# Patient Record
Sex: Male | Born: 1970 | Race: Black or African American | Hispanic: No | Marital: Single | Smoking: Former smoker
Health system: Southern US, Community
[De-identification: ages and names within clinical notes are randomized; demographics above are authoritative.]

## PROBLEM LIST (undated history)

## (undated) DIAGNOSIS — Z87898 Personal history of other specified conditions: Secondary | ICD-10-CM

## (undated) DIAGNOSIS — Z9289 Personal history of other medical treatment: Secondary | ICD-10-CM

## (undated) DIAGNOSIS — G4730 Sleep apnea, unspecified: Secondary | ICD-10-CM

## (undated) DIAGNOSIS — F1011 Alcohol abuse, in remission: Secondary | ICD-10-CM

## (undated) DIAGNOSIS — E785 Hyperlipidemia, unspecified: Secondary | ICD-10-CM

## (undated) DIAGNOSIS — K219 Gastro-esophageal reflux disease without esophagitis: Secondary | ICD-10-CM

## (undated) DIAGNOSIS — I471 Supraventricular tachycardia, unspecified: Secondary | ICD-10-CM

## (undated) DIAGNOSIS — I10 Essential (primary) hypertension: Secondary | ICD-10-CM

## (undated) DIAGNOSIS — I480 Paroxysmal atrial fibrillation: Secondary | ICD-10-CM

## (undated) DIAGNOSIS — I639 Cerebral infarction, unspecified: Secondary | ICD-10-CM

## (undated) DIAGNOSIS — E669 Obesity, unspecified: Secondary | ICD-10-CM

## (undated) DIAGNOSIS — F1491 Cocaine use, unspecified, in remission: Secondary | ICD-10-CM

## (undated) HISTORY — PX: APPENDECTOMY: SHX54

## (undated) HISTORY — DX: Cerebral infarction, unspecified: I63.9

## (undated) HISTORY — PX: ROTATOR CUFF REPAIR: SHX139

## (undated) HISTORY — DX: Personal history of other medical treatment: Z92.89

## (undated) HISTORY — DX: Supraventricular tachycardia: I47.1

## (undated) HISTORY — DX: Gastro-esophageal reflux disease without esophagitis: K21.9

## (undated) HISTORY — DX: Supraventricular tachycardia, unspecified: I47.10

## (undated) HISTORY — PX: TONSILLECTOMY: SUR1361

## (undated) HISTORY — DX: Hyperlipidemia, unspecified: E78.5

---

## 1998-01-23 ENCOUNTER — Emergency Department (HOSPITAL_COMMUNITY): Admission: EM | Admit: 1998-01-23 | Discharge: 1998-01-23 | Payer: Self-pay | Admitting: Emergency Medicine

## 1998-03-08 ENCOUNTER — Emergency Department (HOSPITAL_COMMUNITY): Admission: EM | Admit: 1998-03-08 | Discharge: 1998-03-08 | Payer: Self-pay | Admitting: Emergency Medicine

## 1998-07-10 ENCOUNTER — Emergency Department (HOSPITAL_COMMUNITY): Admission: EM | Admit: 1998-07-10 | Discharge: 1998-07-10 | Payer: Self-pay | Admitting: Emergency Medicine

## 1999-08-16 ENCOUNTER — Emergency Department (HOSPITAL_COMMUNITY): Admission: EM | Admit: 1999-08-16 | Discharge: 1999-08-16 | Payer: Self-pay | Admitting: Emergency Medicine

## 2000-11-23 ENCOUNTER — Encounter: Payer: Self-pay | Admitting: Internal Medicine

## 2000-11-23 ENCOUNTER — Ambulatory Visit (HOSPITAL_BASED_OUTPATIENT_CLINIC_OR_DEPARTMENT_OTHER): Admission: RE | Admit: 2000-11-23 | Discharge: 2000-11-23 | Payer: Self-pay | Admitting: Otolaryngology

## 2000-12-27 ENCOUNTER — Encounter: Payer: Self-pay | Admitting: Internal Medicine

## 2000-12-27 ENCOUNTER — Ambulatory Visit (HOSPITAL_BASED_OUTPATIENT_CLINIC_OR_DEPARTMENT_OTHER): Admission: RE | Admit: 2000-12-27 | Discharge: 2000-12-27 | Payer: Self-pay | Admitting: Otolaryngology

## 2004-09-18 ENCOUNTER — Emergency Department (HOSPITAL_COMMUNITY): Admission: EM | Admit: 2004-09-18 | Discharge: 2004-09-18 | Payer: Self-pay | Admitting: Family Medicine

## 2005-06-26 IMAGING — CR DG CHEST 1V PORT
1 series · 1 of 1 positions shown · non-contrast
Comparison: None.

CLINICAL DATA: Short of breath. 
 CHEST PORTABLE ([DATE] HOURS):

[view not recorded]
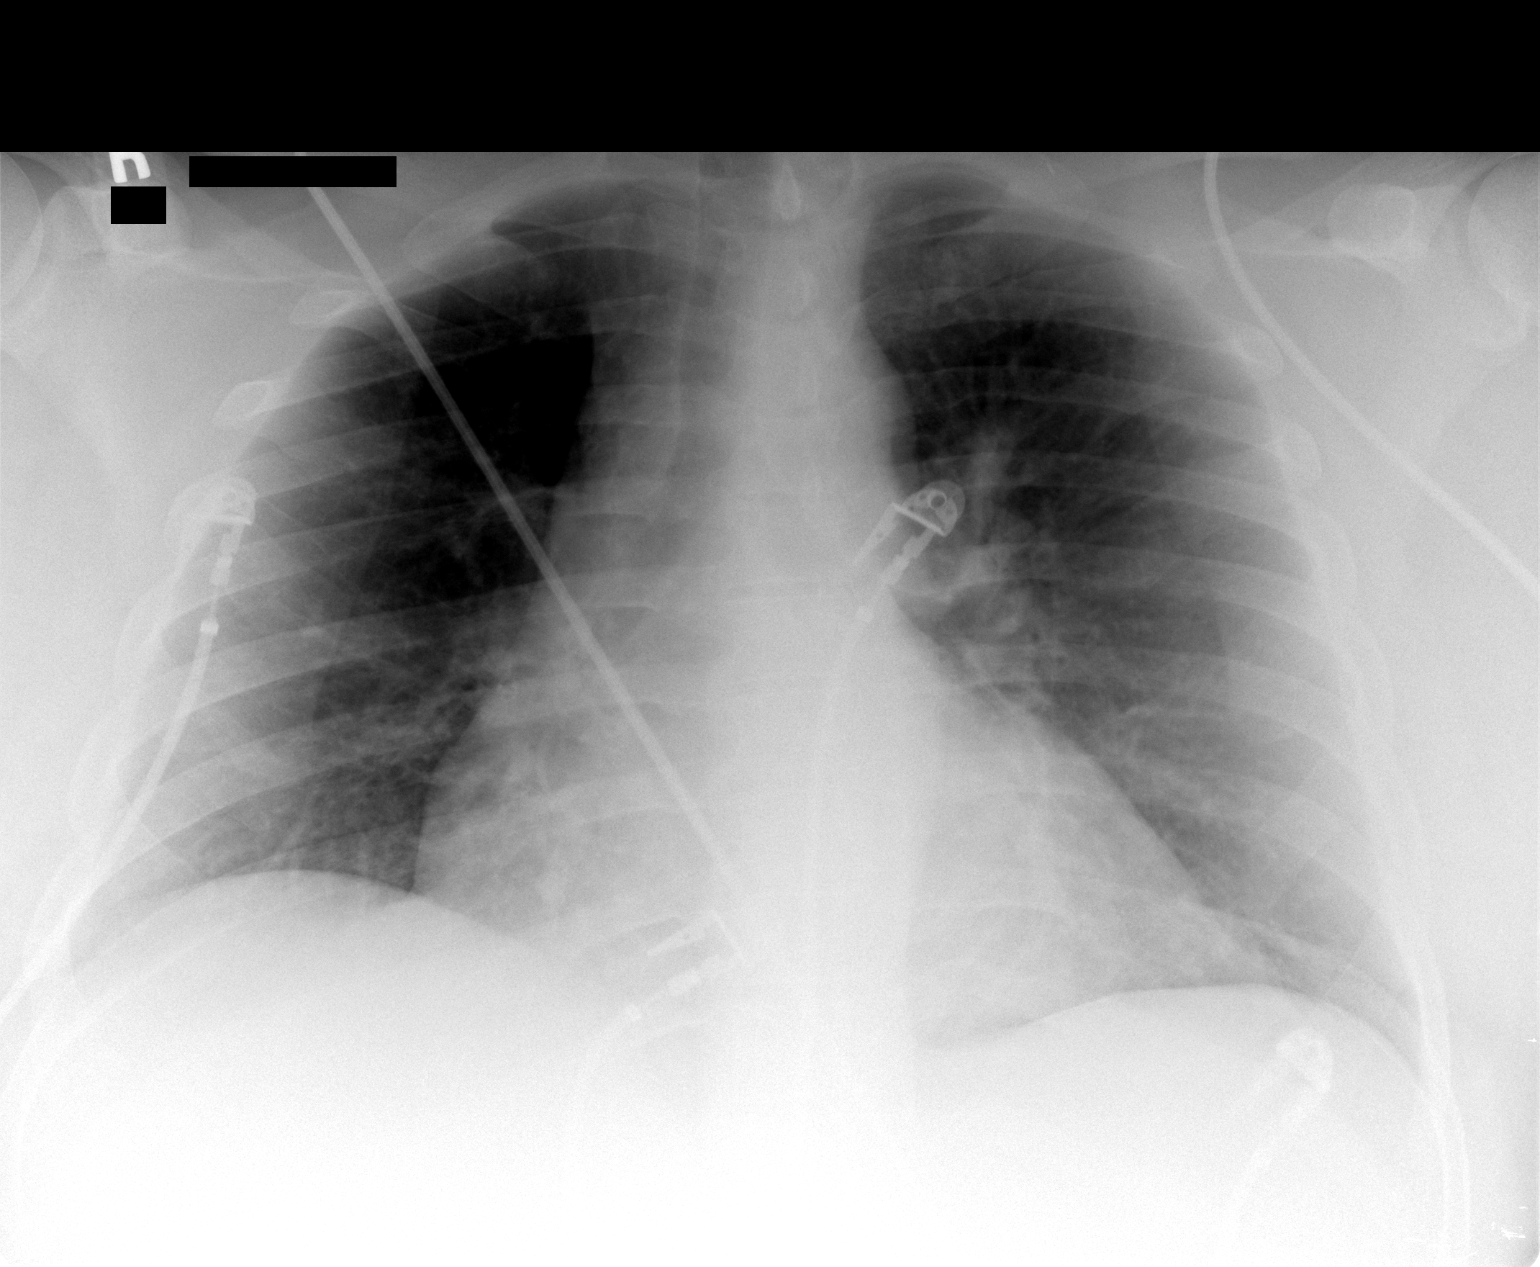

[1 of 1 positions shown; findings below may reference images not displayed]

FINDINGS: The heart is enlarged.  There is no heart failure, infiltrate, or effusion.  There is probable COPD.
IMPRESSION: No acute abnormality.

## 2005-06-27 ENCOUNTER — Inpatient Hospital Stay (HOSPITAL_COMMUNITY): Admission: EM | Admit: 2005-06-27 | Discharge: 2005-06-29 | Payer: Self-pay | Admitting: Emergency Medicine

## 2006-08-09 ENCOUNTER — Emergency Department (HOSPITAL_COMMUNITY): Admission: EM | Admit: 2006-08-09 | Discharge: 2006-08-09 | Payer: Self-pay | Admitting: *Deleted

## 2006-08-24 ENCOUNTER — Emergency Department (HOSPITAL_COMMUNITY): Admission: EM | Admit: 2006-08-24 | Discharge: 2006-08-24 | Payer: Self-pay | Admitting: Family Medicine

## 2006-08-24 IMAGING — CR DG FOOT COMPLETE 3+V*L*
3 series · 3 of 3 positions shown · non-contrast
Comparison: None.

CLINICAL DATA: 35-year-old stepped on glass.  Pain between second and third metatarsal phalangeal joints.
 LEFT FOOT - 3 VIEW:

[view not recorded (1 of 3)]
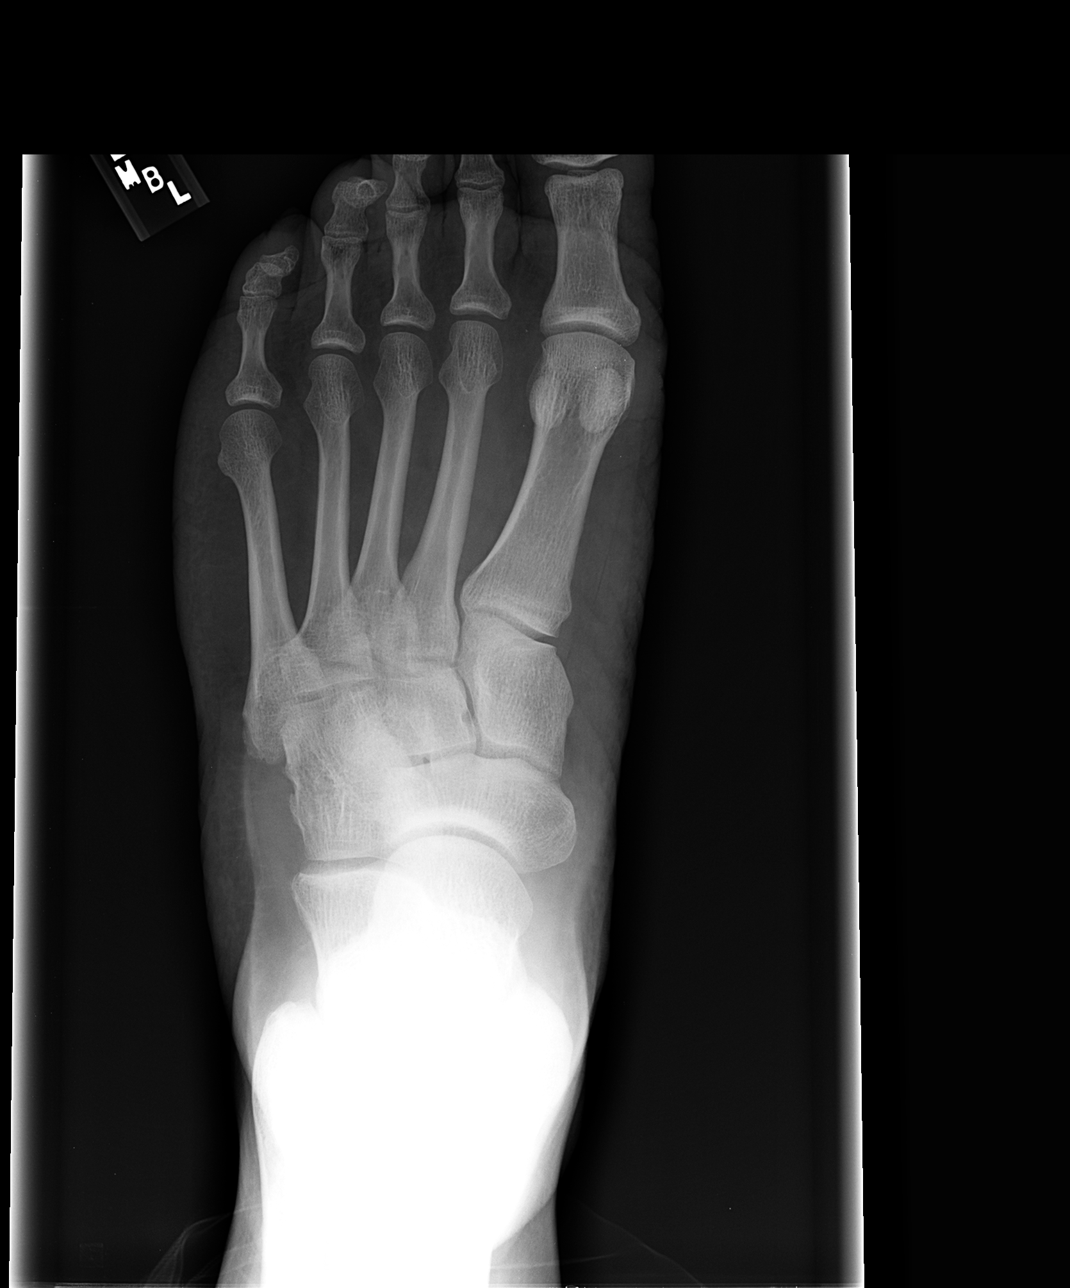

[view not recorded (2 of 3)]
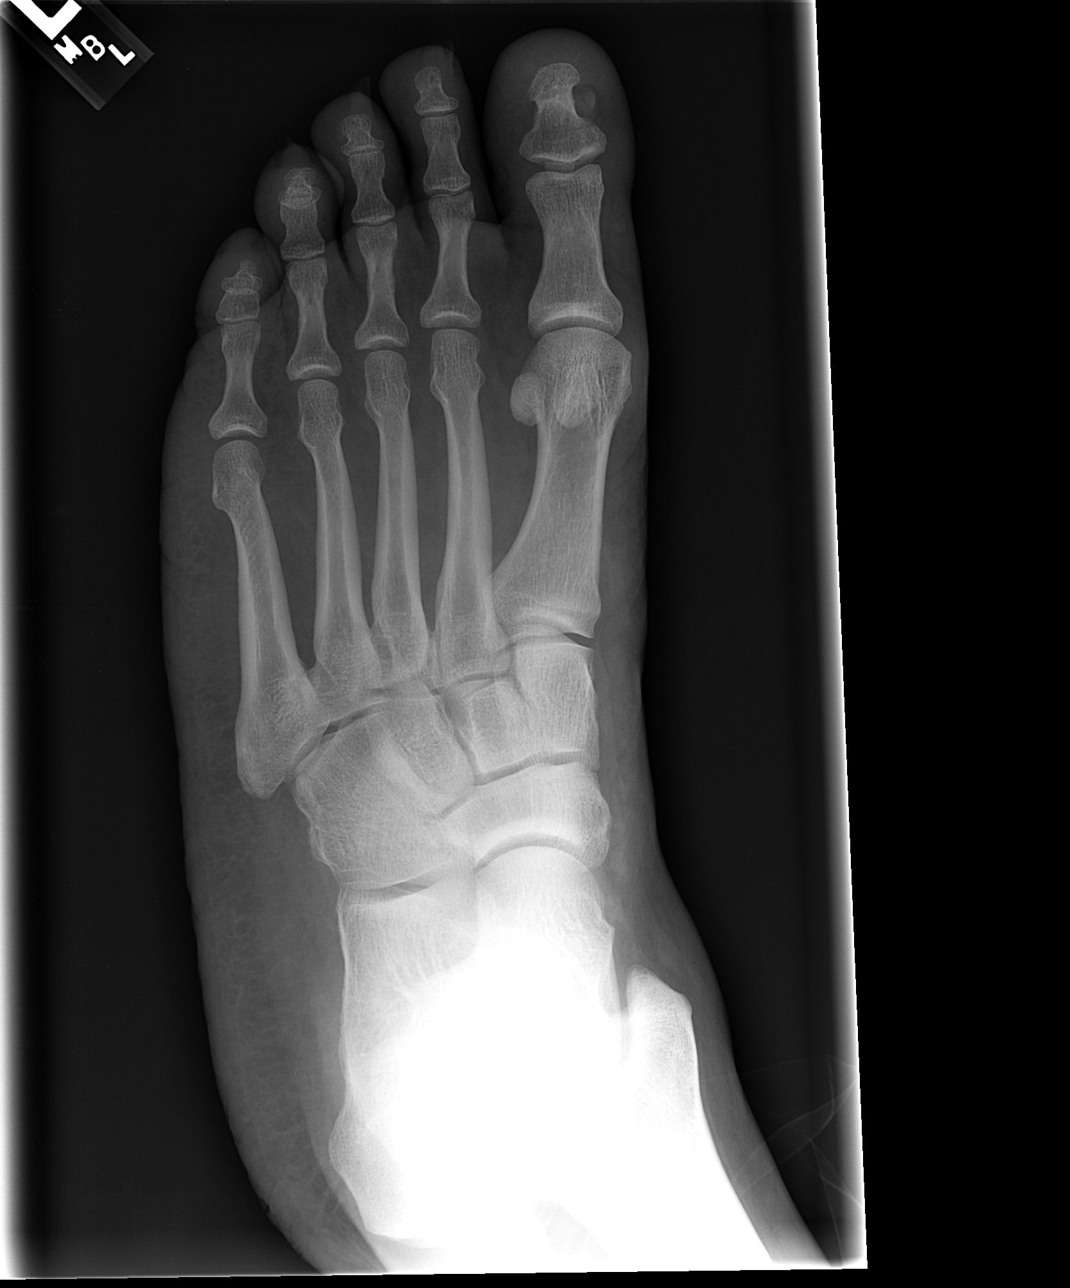

[view not recorded (3 of 3)]
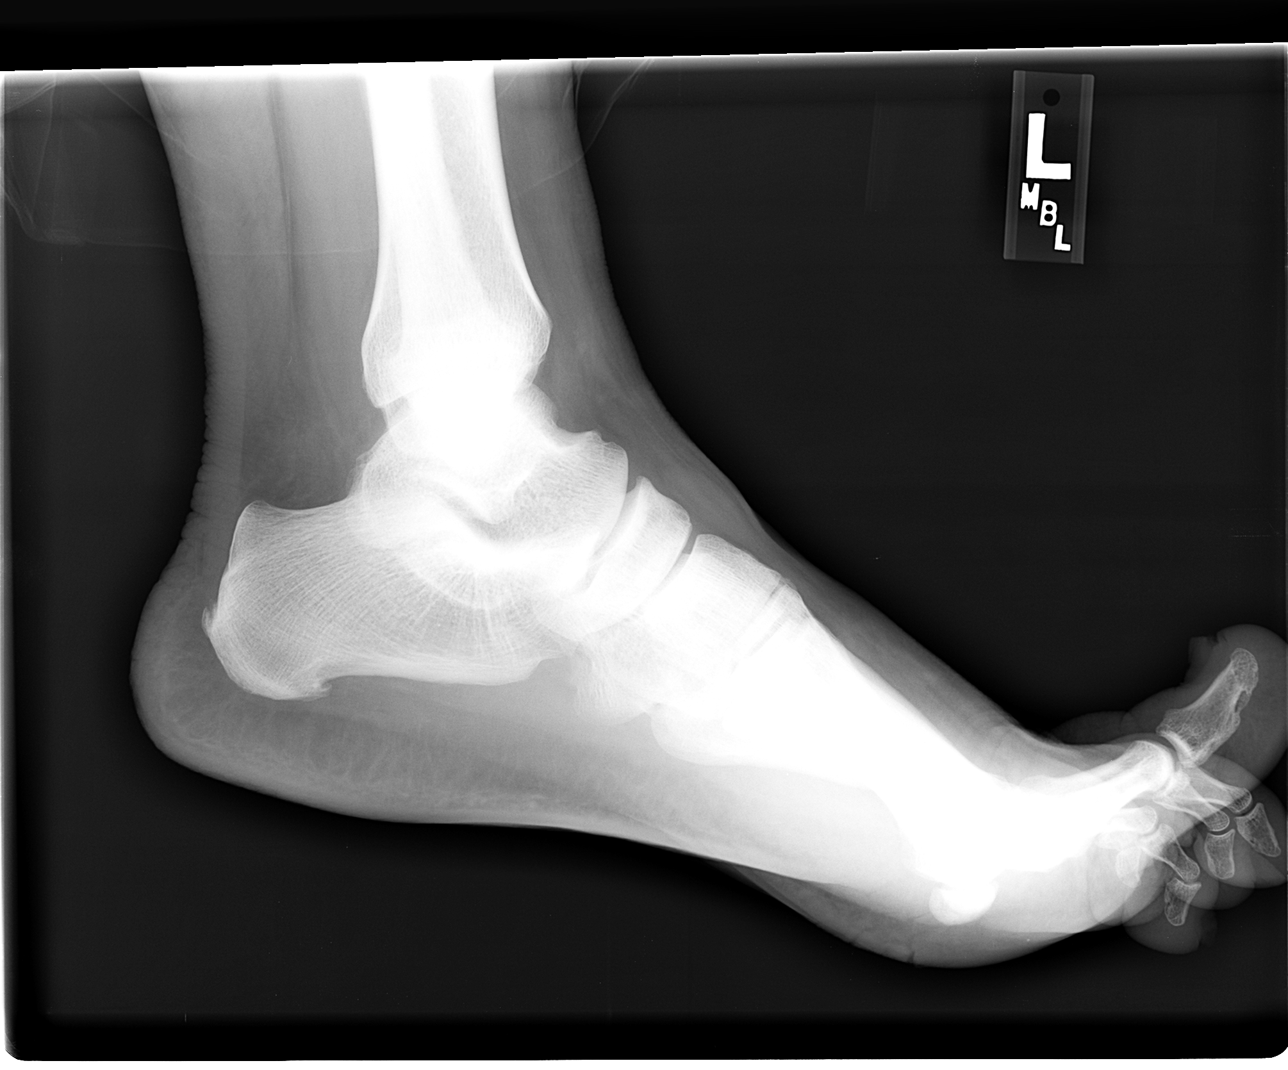

[3 of 3 positions shown; findings below may reference images not displayed]

FINDINGS: No acute bony findings.  The joint spaces are maintained.  I don?t see any findings to suggest septic arthritis.  There is no air in the soft tissues.  There are a few tiny specks on the AP film and one tiny MOUGAQ on the lateral film, which may be artifact.  I don?t see any definite worrisome radiopaque foreign body.
IMPRESSION: No acute bony findings and no definite radiopaque foreign body.

## 2007-05-06 ENCOUNTER — Inpatient Hospital Stay (HOSPITAL_COMMUNITY): Admission: EM | Admit: 2007-05-06 | Discharge: 2007-05-08 | Payer: Self-pay | Admitting: Emergency Medicine

## 2007-05-06 IMAGING — CR DG CHEST 1V PORT
1 series · 1 of 1 positions shown · non-contrast
Comparison: none

CLINICAL DATA: Shortness of breath, tachycardia

Portable chest at [0O]:
Comparison [DATE]. Low lung volumes with crowded bronchovascular structures
in the lung bases. Heart size upper limits normal. No definite effusion. No
overt edema. Visualized bones unremarkable.

[view not recorded]
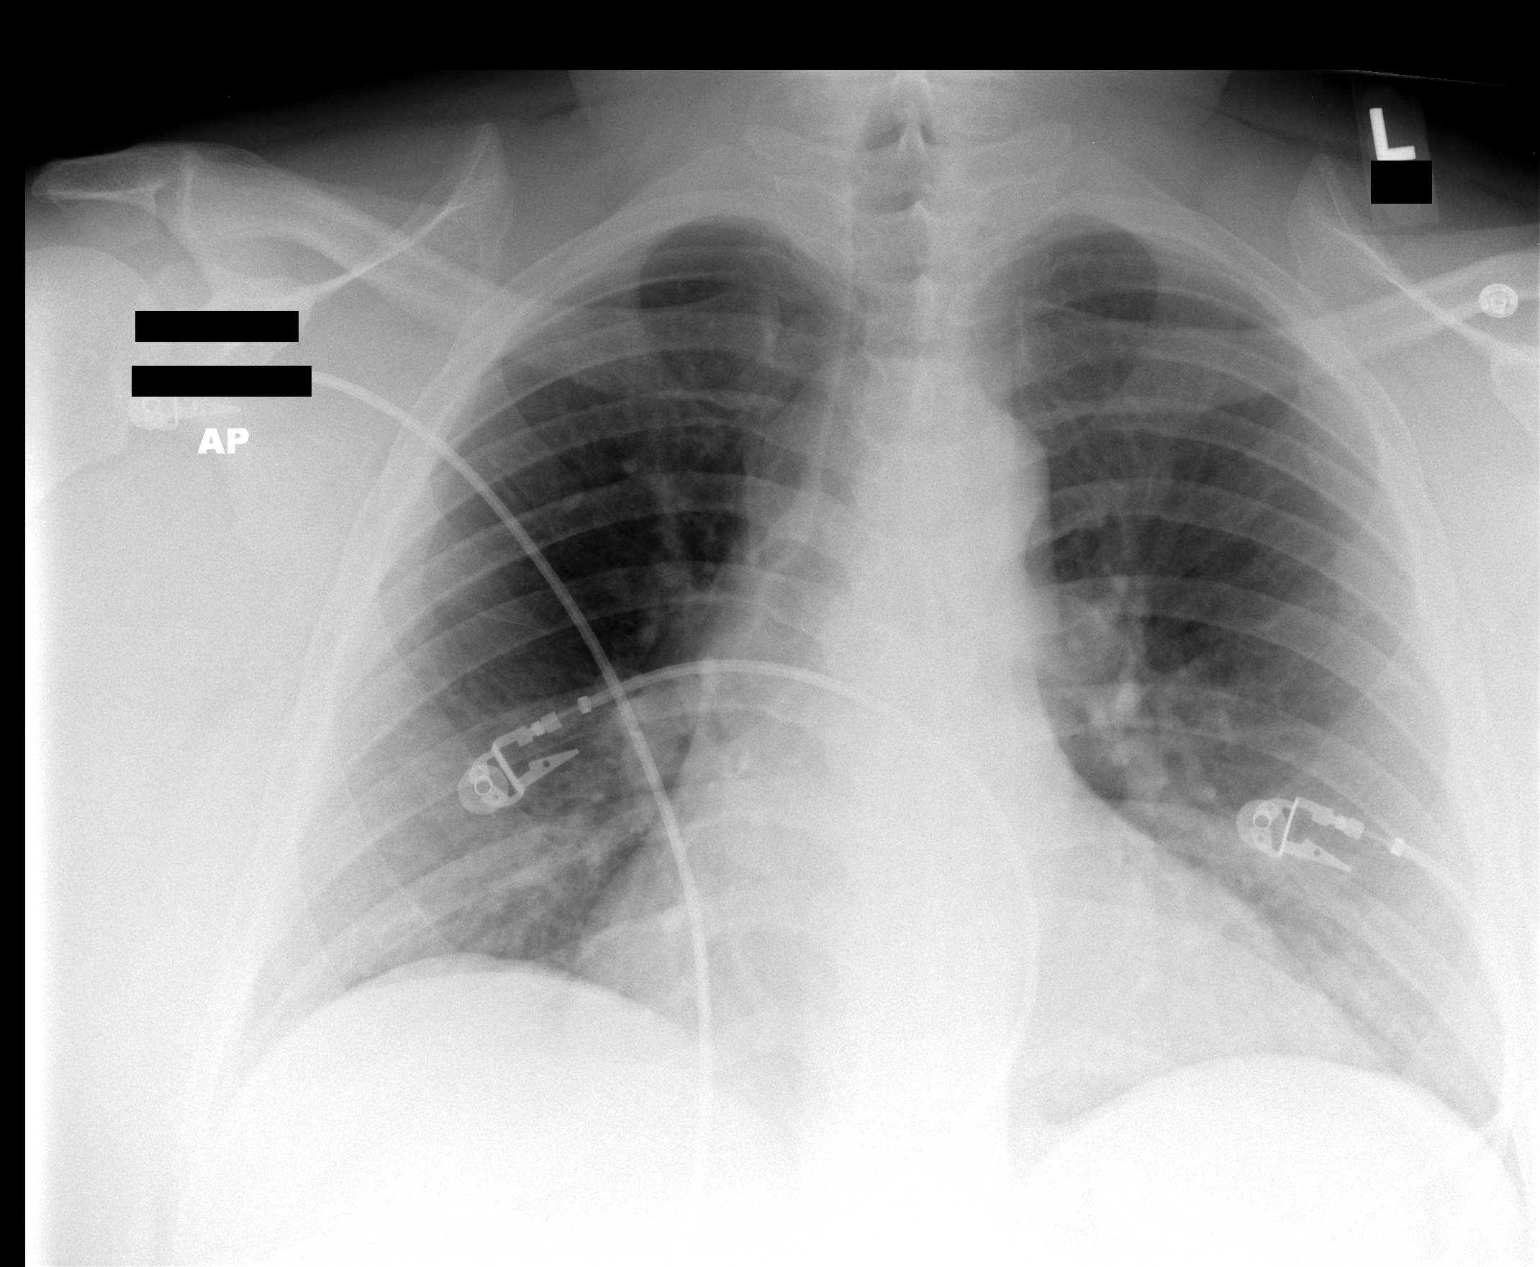

[1 of 1 positions shown; findings below may reference images not displayed]

IMPRESSION: 1. Low volumes with no convincing acute cardiopulmonary disease.

## 2007-05-07 ENCOUNTER — Encounter (INDEPENDENT_AMBULATORY_CARE_PROVIDER_SITE_OTHER): Payer: Self-pay | Admitting: *Deleted

## 2007-07-04 ENCOUNTER — Ambulatory Visit: Payer: Self-pay | Admitting: Internal Medicine

## 2007-07-17 ENCOUNTER — Emergency Department (HOSPITAL_COMMUNITY): Admission: EM | Admit: 2007-07-17 | Discharge: 2007-07-17 | Payer: Self-pay | Admitting: Family Medicine

## 2008-02-12 DIAGNOSIS — E785 Hyperlipidemia, unspecified: Secondary | ICD-10-CM | POA: Insufficient documentation

## 2008-02-12 DIAGNOSIS — F1410 Cocaine abuse, uncomplicated: Secondary | ICD-10-CM | POA: Insufficient documentation

## 2008-02-12 DIAGNOSIS — G4733 Obstructive sleep apnea (adult) (pediatric): Secondary | ICD-10-CM | POA: Insufficient documentation

## 2008-02-12 DIAGNOSIS — F1910 Other psychoactive substance abuse, uncomplicated: Secondary | ICD-10-CM | POA: Insufficient documentation

## 2008-02-13 ENCOUNTER — Ambulatory Visit: Payer: Self-pay | Admitting: Internal Medicine

## 2008-02-13 DIAGNOSIS — J302 Other seasonal allergic rhinitis: Secondary | ICD-10-CM | POA: Insufficient documentation

## 2008-02-13 DIAGNOSIS — J3089 Other allergic rhinitis: Secondary | ICD-10-CM

## 2008-03-06 ENCOUNTER — Encounter: Payer: Self-pay | Admitting: Internal Medicine

## 2008-03-10 ENCOUNTER — Observation Stay (HOSPITAL_COMMUNITY): Admission: EM | Admit: 2008-03-10 | Discharge: 2008-03-12 | Payer: Self-pay | Admitting: Emergency Medicine

## 2008-03-10 IMAGING — CR DG CHEST 1V PORT
1 series · 1 of 1 positions shown · non-contrast
Comparison: [DATE].

CLINICAL DATA: Shortness of breath.  Arrhythmia.  Diaphoretic.
History of hypertension and diabetes.

PORTABLE CHEST - 1 VIEW

[view not recorded]
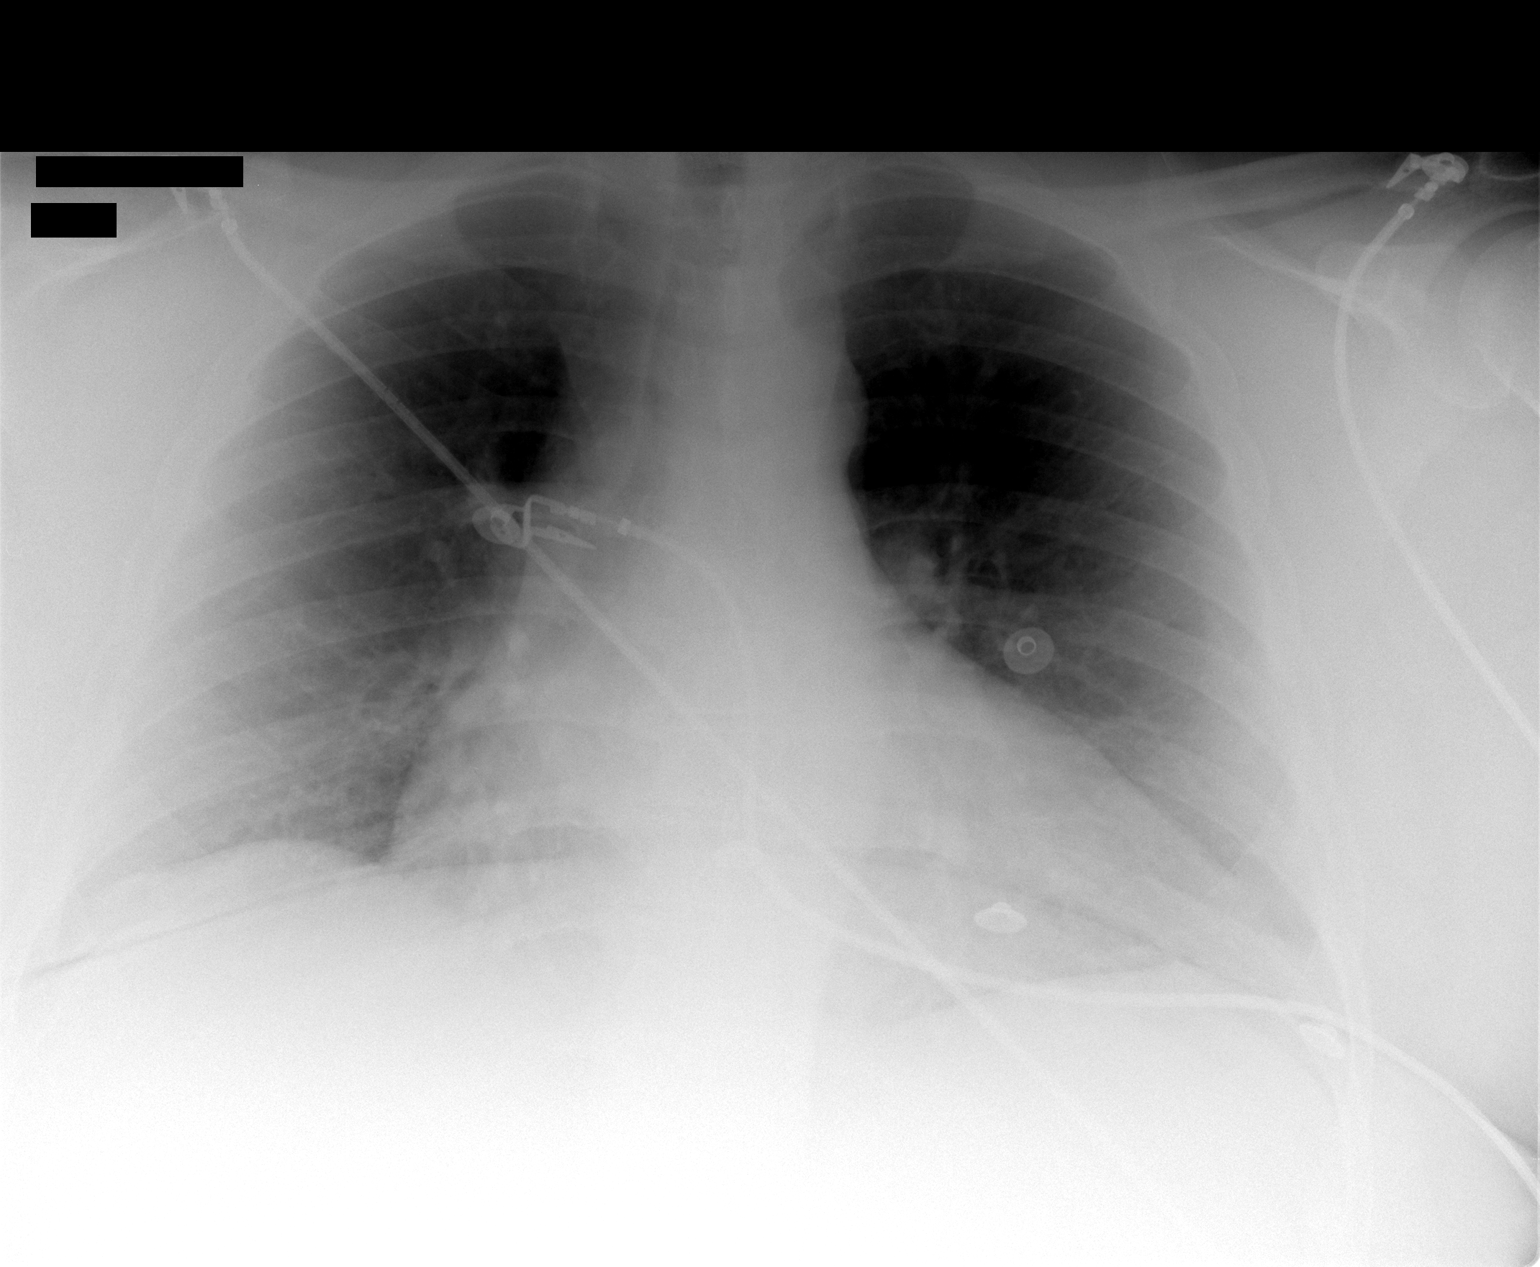

[1 of 1 positions shown; findings below may reference images not displayed]

FINDINGS: Stable enlarged cardiac silhouette.  Clear lungs with
normal vascularity.  Unremarkable bones.
IMPRESSION: Stable cardiomegaly.  No acute abnormality.

## 2008-03-13 ENCOUNTER — Ambulatory Visit: Payer: Self-pay | Admitting: Internal Medicine

## 2008-03-24 ENCOUNTER — Encounter: Admission: RE | Admit: 2008-03-24 | Discharge: 2008-03-24 | Payer: Self-pay | Admitting: Internal Medicine

## 2008-03-24 IMAGING — CR DG FINGER THUMB 2+V*L*
1 series · 1 of 1 positions shown · non-contrast
Comparison: None

CLINICAL DATA: Crush injury

LEFT THUMB 2+V

[view not recorded]
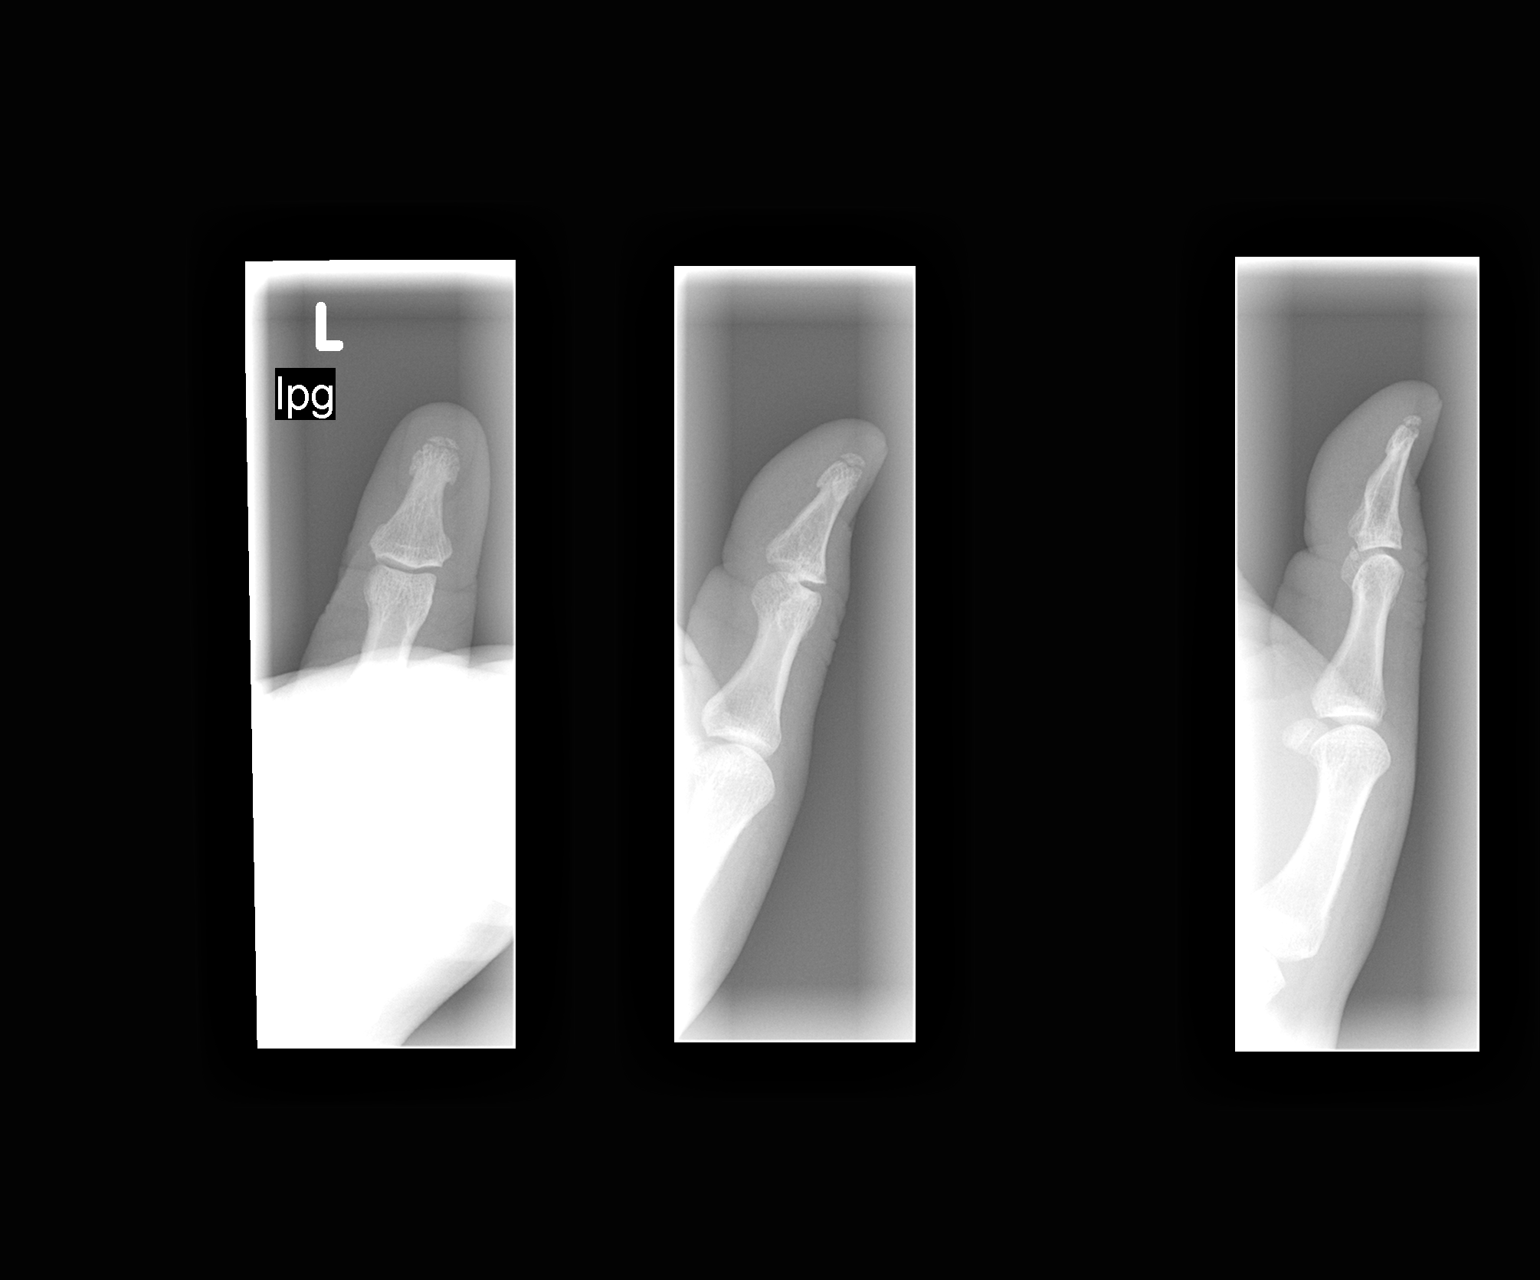

[1 of 1 positions shown; findings below may reference images not displayed]

FINDINGS: There is a transverse fracture of the distal tuft of the
first phalanx.  This shows minimal displacement.  No other fracture
or foreign bodies identified.
IMPRESSION: mildly displaced transverse fracture of the tuft of the distal
phalanx.

## 2008-11-05 ENCOUNTER — Ambulatory Visit: Payer: Self-pay | Admitting: Gastroenterology

## 2008-11-05 DIAGNOSIS — R195 Other fecal abnormalities: Secondary | ICD-10-CM | POA: Insufficient documentation

## 2008-11-09 ENCOUNTER — Ambulatory Visit: Payer: Self-pay | Admitting: Gastroenterology

## 2008-11-26 ENCOUNTER — Telehealth (INDEPENDENT_AMBULATORY_CARE_PROVIDER_SITE_OTHER): Payer: Self-pay | Admitting: *Deleted

## 2009-01-23 ENCOUNTER — Inpatient Hospital Stay (HOSPITAL_COMMUNITY): Admission: EM | Admit: 2009-01-23 | Discharge: 2009-01-26 | Payer: Self-pay | Admitting: Emergency Medicine

## 2009-01-23 ENCOUNTER — Ambulatory Visit: Payer: Self-pay | Admitting: *Deleted

## 2009-01-23 IMAGING — CR DG CHEST 1V PORT
1 series · 1 of 1 positions shown · non-contrast
Comparison: [DATE]

CLINICAL DATA: Palpitations

PORTABLE CHEST - 1 VIEW

[AP]
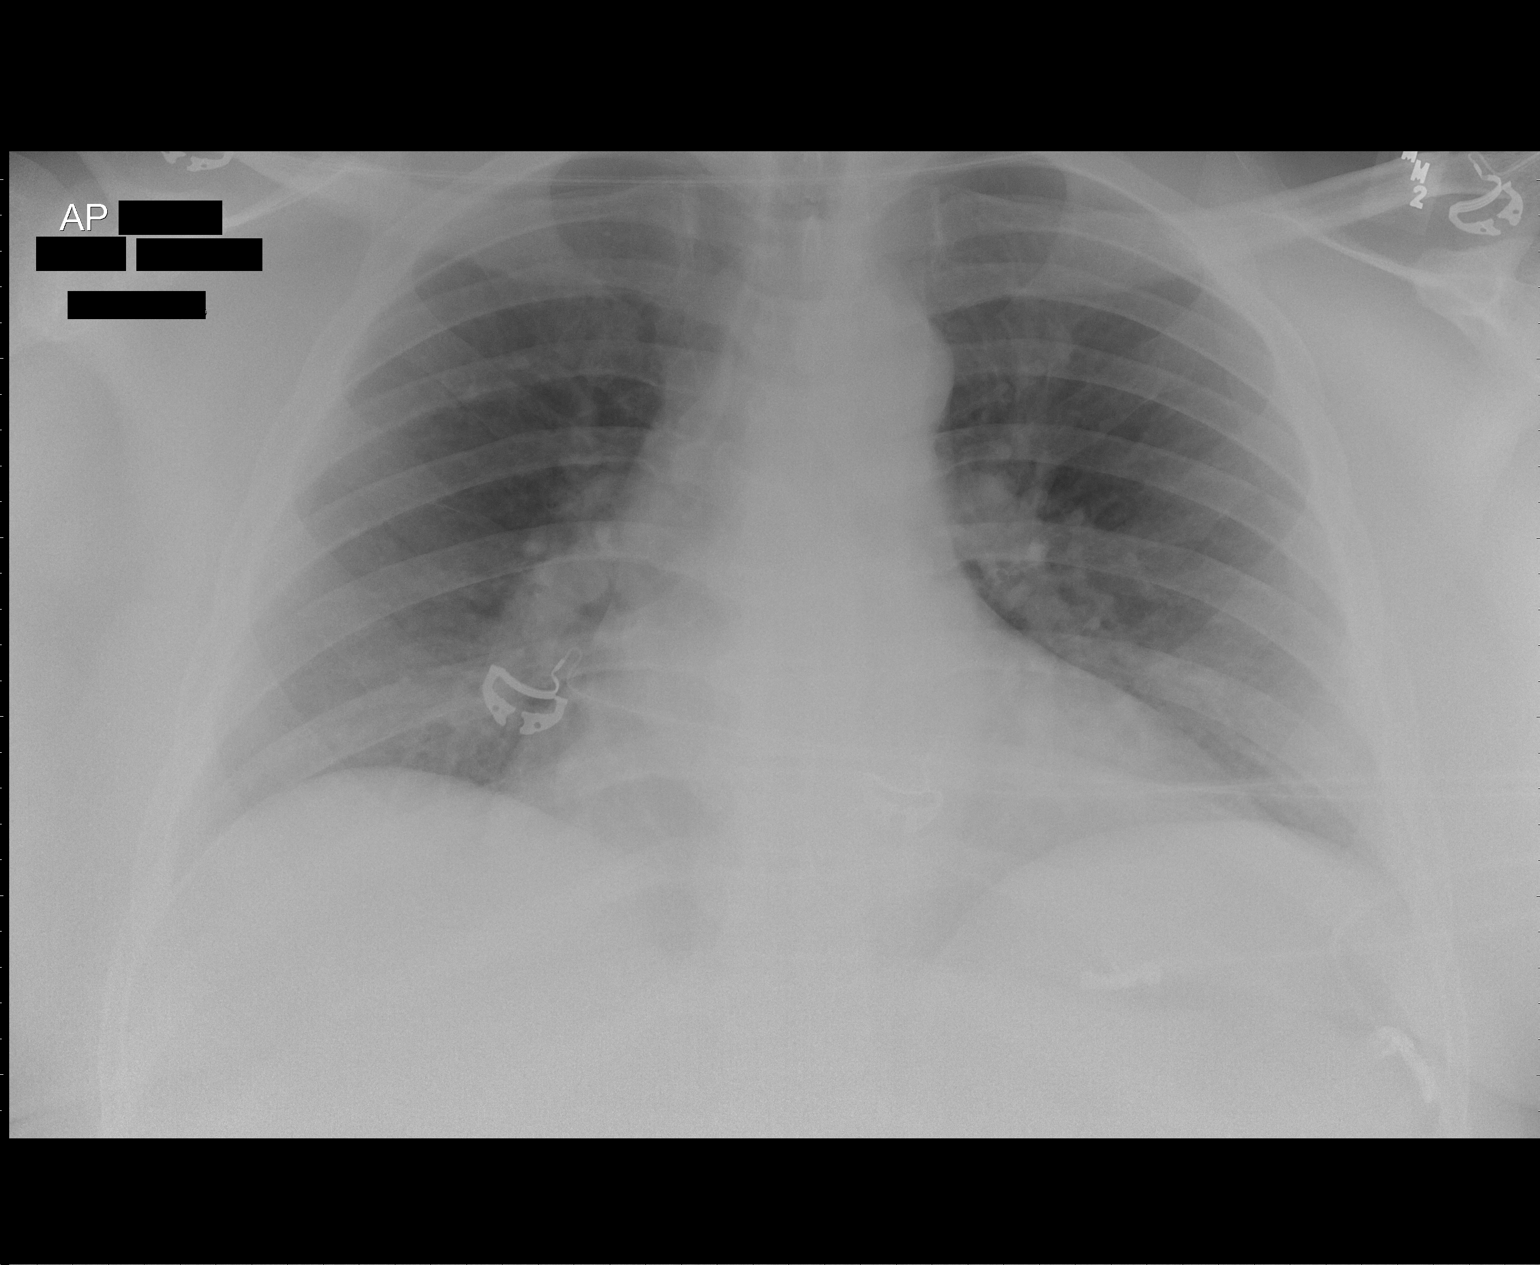

[1 of 1 positions shown; findings below may reference images not displayed]

FINDINGS: The lungs are clear.  The heart and mediastinal contours
are normal.  Osseous structures are unremarkable.
IMPRESSION: No acute cardiac or pulmonary process.

## 2009-05-10 ENCOUNTER — Emergency Department (HOSPITAL_COMMUNITY): Admission: EM | Admit: 2009-05-10 | Discharge: 2009-05-10 | Payer: Self-pay | Admitting: Internal Medicine

## 2010-01-12 ENCOUNTER — Telehealth: Payer: Self-pay | Admitting: Gastroenterology

## 2010-01-21 ENCOUNTER — Encounter (INDEPENDENT_AMBULATORY_CARE_PROVIDER_SITE_OTHER): Payer: Self-pay

## 2010-01-21 ENCOUNTER — Inpatient Hospital Stay (HOSPITAL_COMMUNITY): Admission: EM | Admit: 2010-01-21 | Discharge: 2010-01-22 | Payer: Self-pay | Admitting: Emergency Medicine

## 2010-01-21 IMAGING — CT CT ABD-PELV W/ CM
2 of 4 series · 14 of 32 positions shown, 19 images · IV contrast (water & 100 ML OMNI 300)
Comparison: None.

CLINICAL DATA: Right lower quadrant pain.  Nausea and vomiting.
Constipation.

CT ABDOMEN AND PELVIS WITH CONTRAST
TECHNIQUE: Multidetector CT imaging of the abdomen and pelvis was
performed following the standard protocol during bolus
administration of intravenous contrast.
Contrast: 100 ml [P7]

[Series 2: routine abdomen · axial · 0.98mm/px · z∈[-463,-108]mm · 6 of 101 slices shown, 11 images]
[im 15/101  soft-tissue]
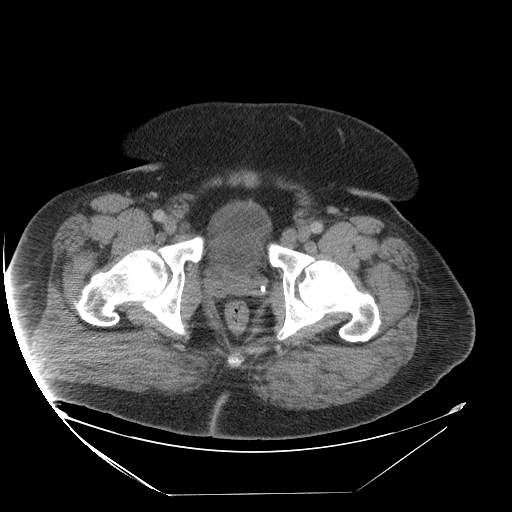
[im 15/101  bone]
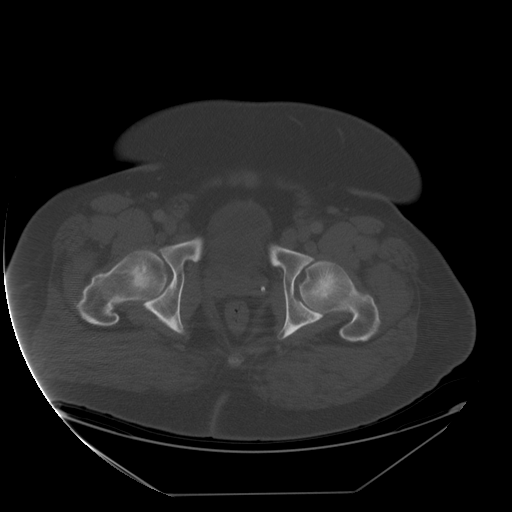
[im 29/101  soft-tissue]
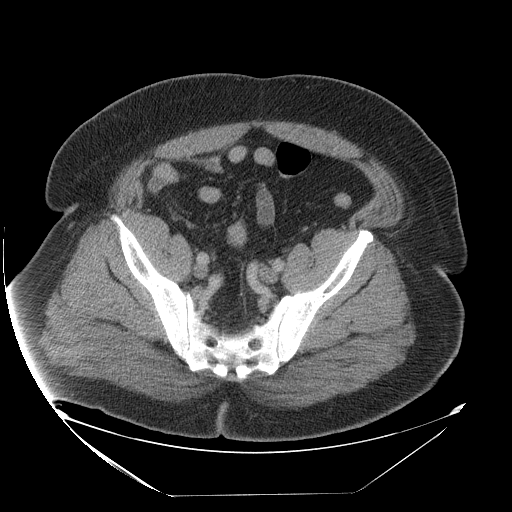
[im 43/101  soft-tissue]
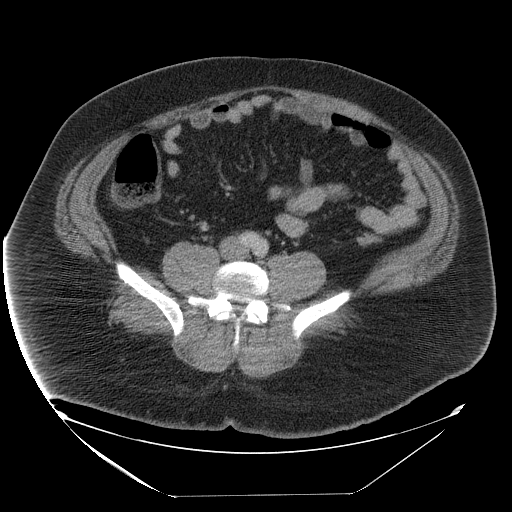
[im 43/101  lung]
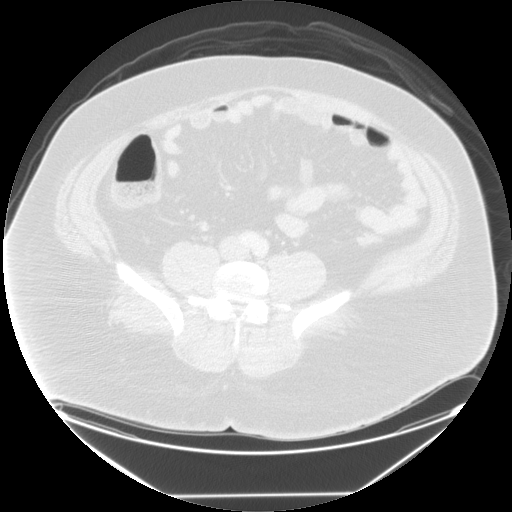
[im 58/101  soft-tissue]
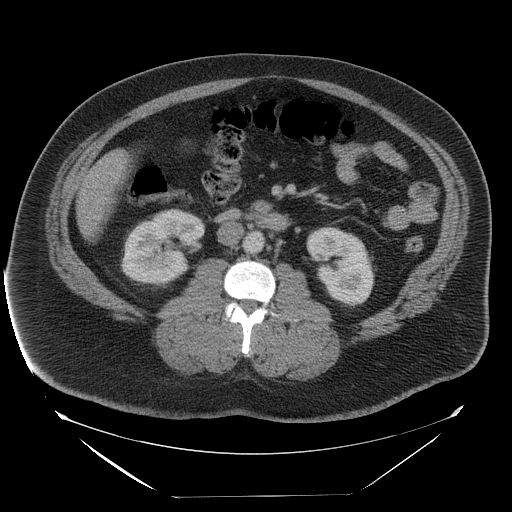
[im 58/101  lung]
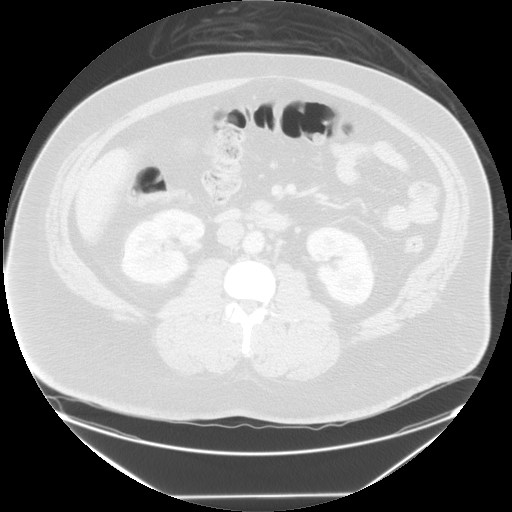
[im 72/101  soft-tissue]
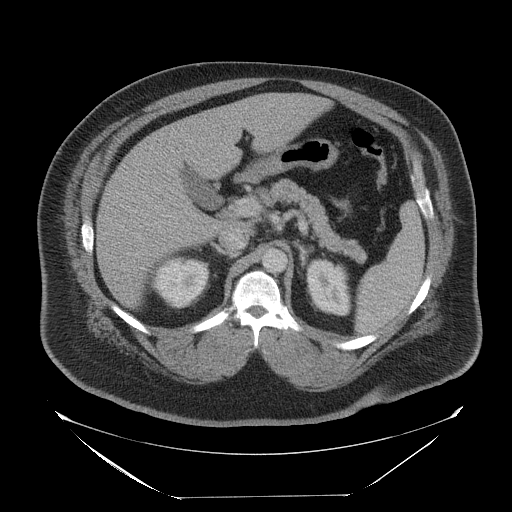
[im 72/101  lung]
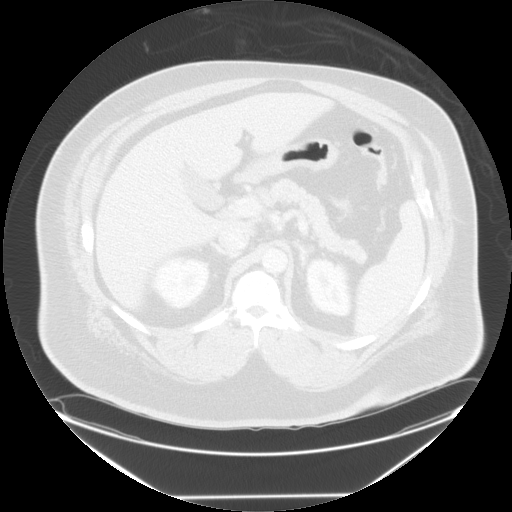
[im 86/101  soft-tissue]
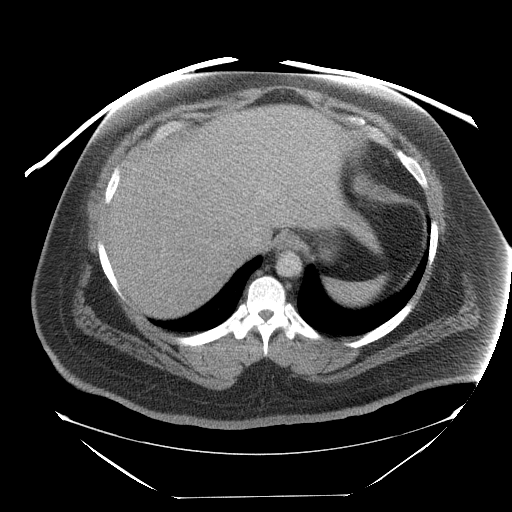
[im 86/101  lung]
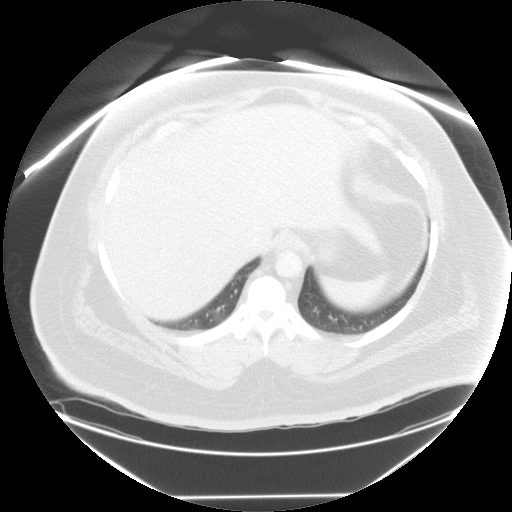

[Series 400: reformatted · sagittal · 1.00mm/px · 8 of 148 slices shown]
[im 13/148  soft-tissue]
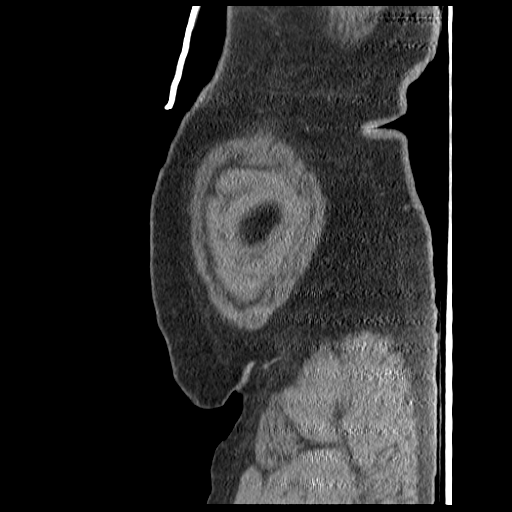
[im 37/148  soft-tissue]
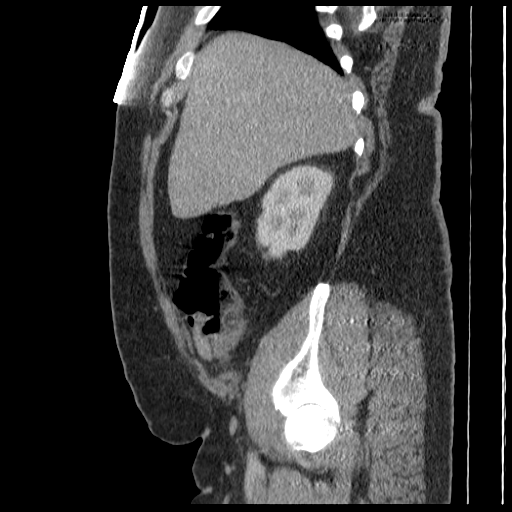
[im 50/148  soft-tissue]
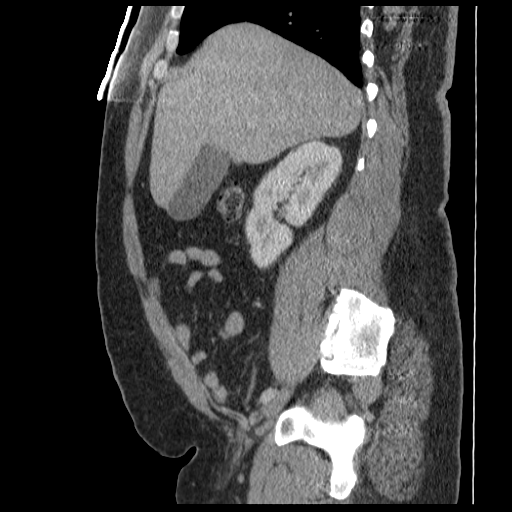
[im 62/148  soft-tissue]
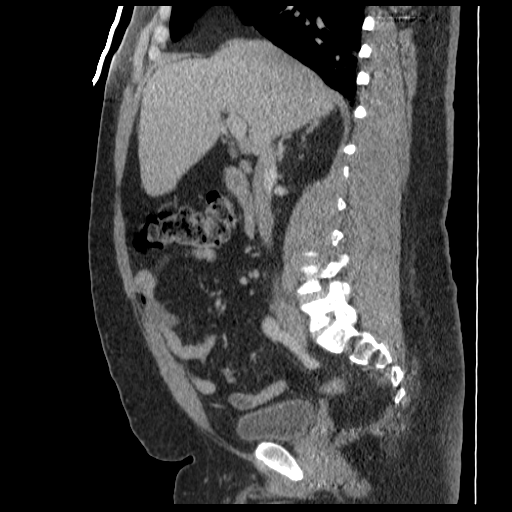
[im 86/148  soft-tissue]
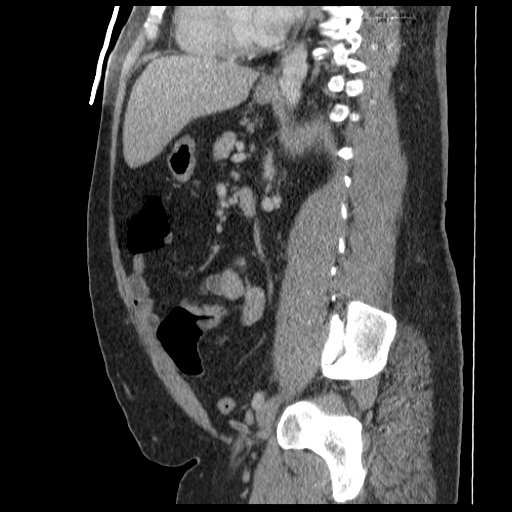
[im 99/148  soft-tissue]
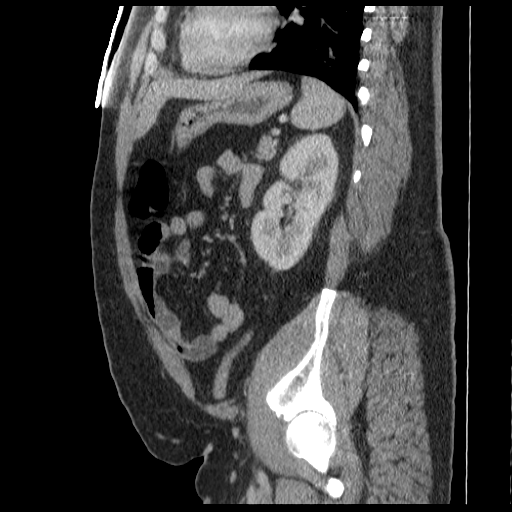
[im 111/148  soft-tissue]
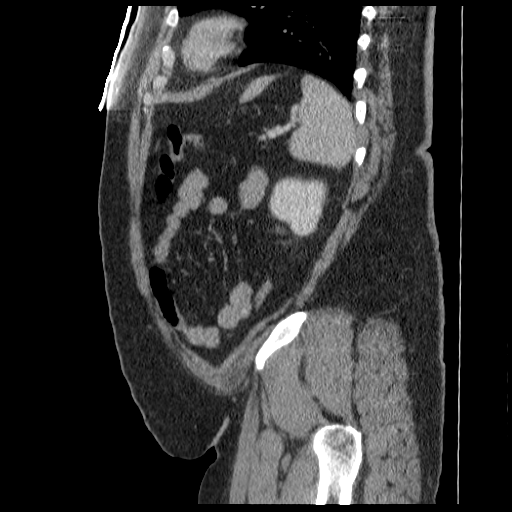
[im 135/148  soft-tissue]
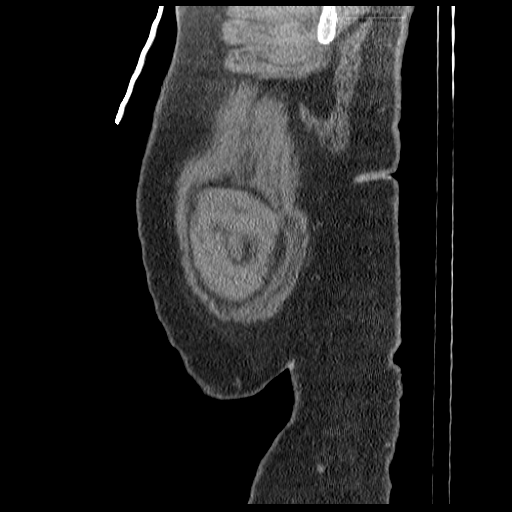

[14 of 32 positions shown; findings below may reference images not displayed]

FINDINGS: A thickened appendix is seen measuring 13-14 mm, with
mild periappendiceal inflammatory changes, consistent with acute
appendicitis.  There is no evidence of abscess or free fluid.  No
evidence of dilated bowel loops.

No soft tissue masses or adenopathy identified within the abdomen
or pelvis.  The abdominal parenchymal organs are normal in
appearance.
IMPRESSION: Acute appendicitis.  No evidence of abscess or other complication.

## 2010-03-18 ENCOUNTER — Ambulatory Visit: Payer: Self-pay | Admitting: Internal Medicine

## 2010-04-09 ENCOUNTER — Emergency Department (HOSPITAL_COMMUNITY): Admission: EM | Admit: 2010-04-09 | Discharge: 2010-04-09 | Payer: Self-pay | Admitting: Emergency Medicine

## 2010-04-11 ENCOUNTER — Emergency Department (HOSPITAL_COMMUNITY): Admission: EM | Admit: 2010-04-11 | Discharge: 2010-04-11 | Payer: Self-pay | Admitting: Emergency Medicine

## 2010-04-11 IMAGING — CT CT PELVIS W/ CM
3 series · 17 of 46 positions shown, 20 images · IV contrast (agent unspecified)
Comparison: None.

CLINICAL DATA: CT PELVIS WITH CONTRAST
TECHNIQUE: Multidetector CT imaging of the pelvis was performed
following the standard protocol.  100 ml [J8] utilized.

[Series 2: pelvis st · axial · 0.93mm/px · z∈[-806,-512]mm · 13 of 67 slices shown, 16 images]
[im 5/67  soft-tissue]
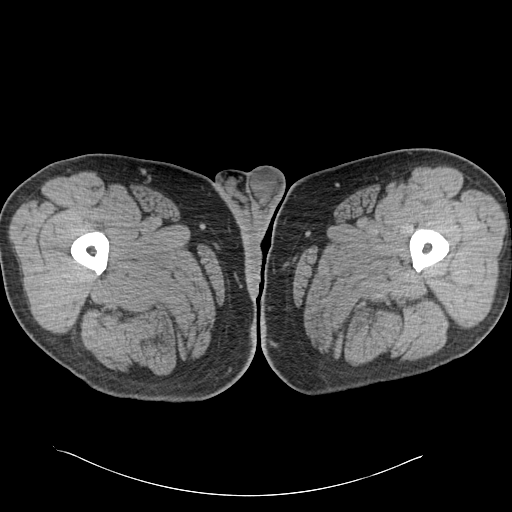
[im 5/67  bone]
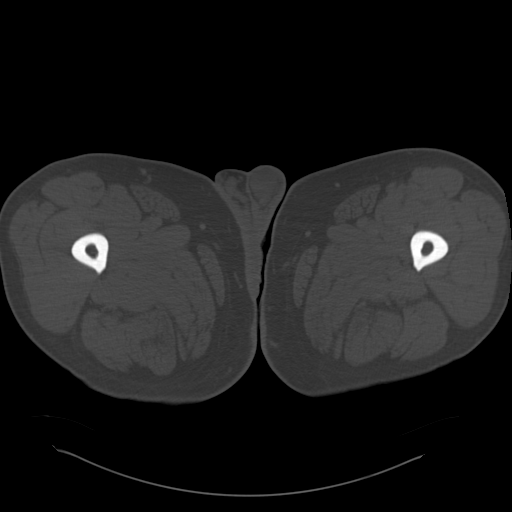
[im 11/67  soft-tissue]
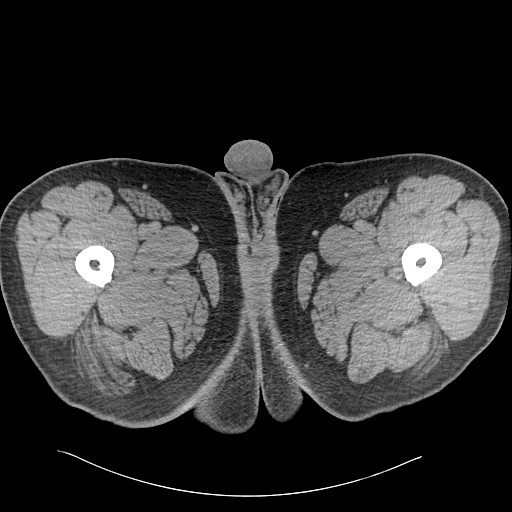
[im 18/67  soft-tissue]
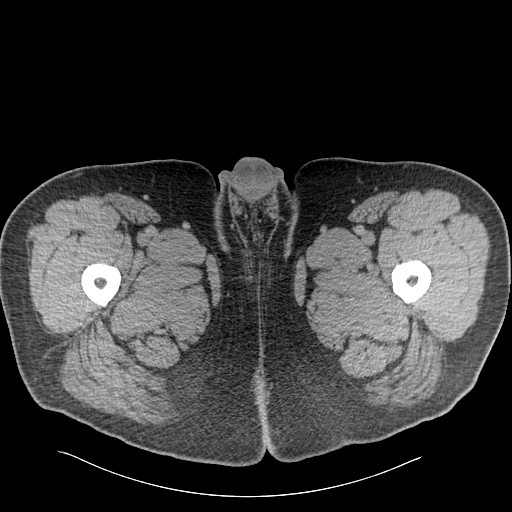
[im 24/67  soft-tissue]
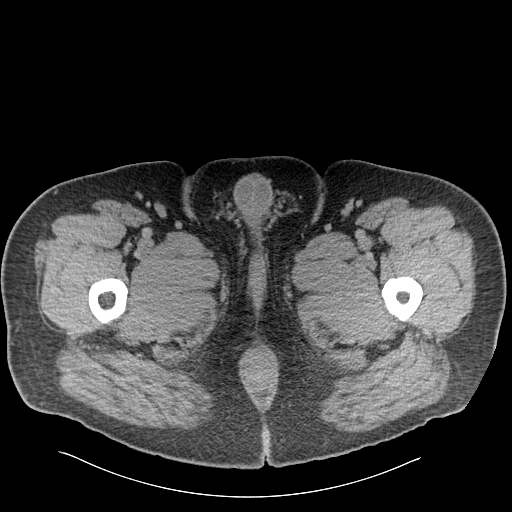
[im 30/67  soft-tissue]
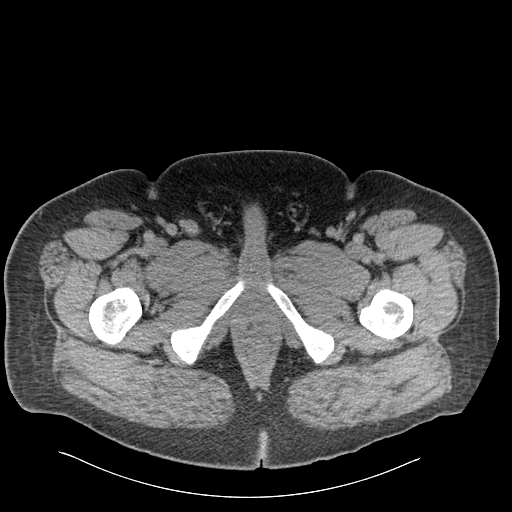
[im 37/67  soft-tissue]
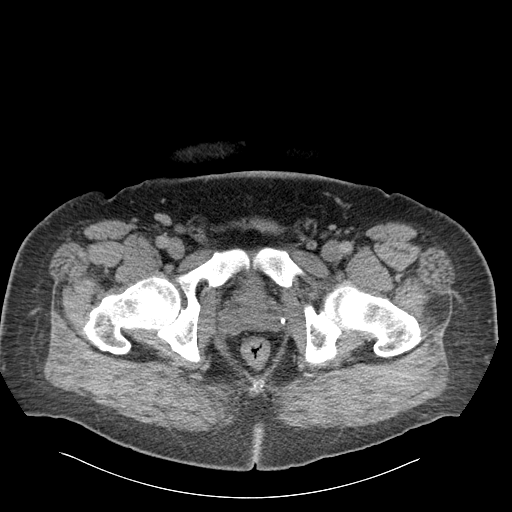
[im 43/67  soft-tissue]
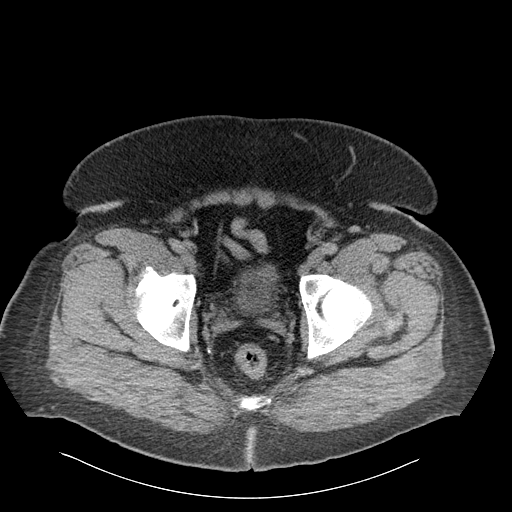
[im 49/67  soft-tissue]
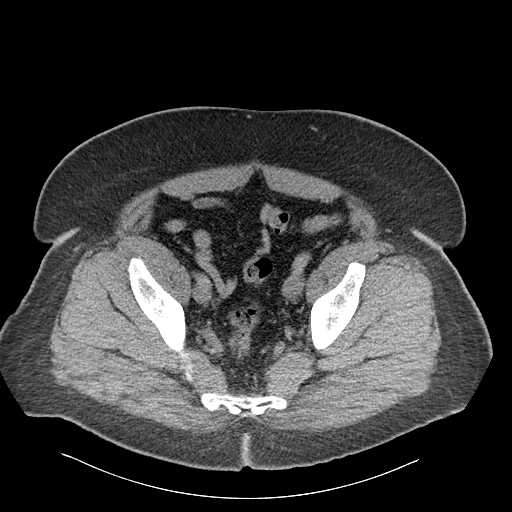
[im 56/67  soft-tissue]
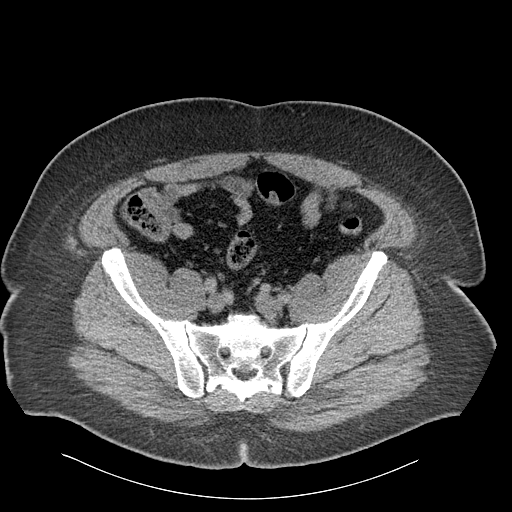
[im 56/67  bone]
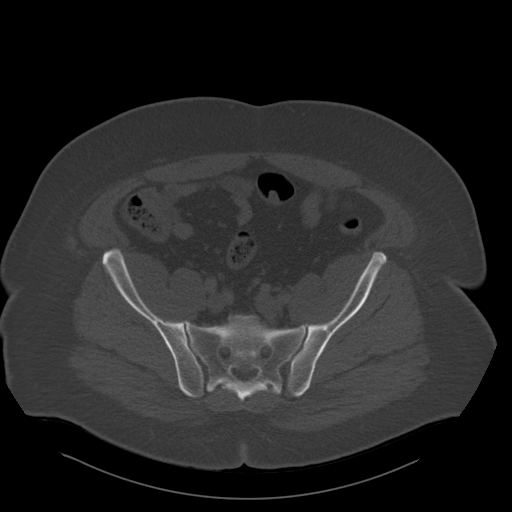
[im 58/67  lung]
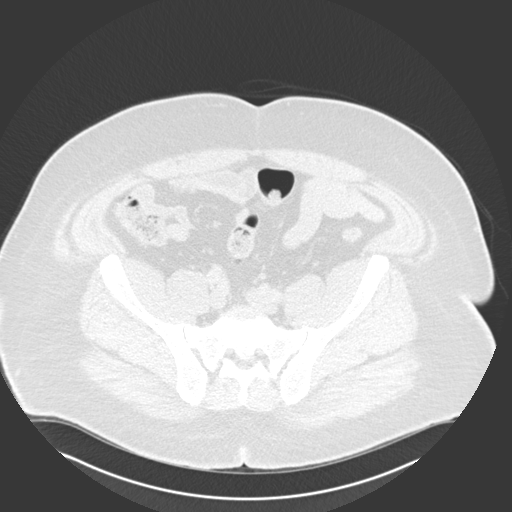
[im 60/67  lung]
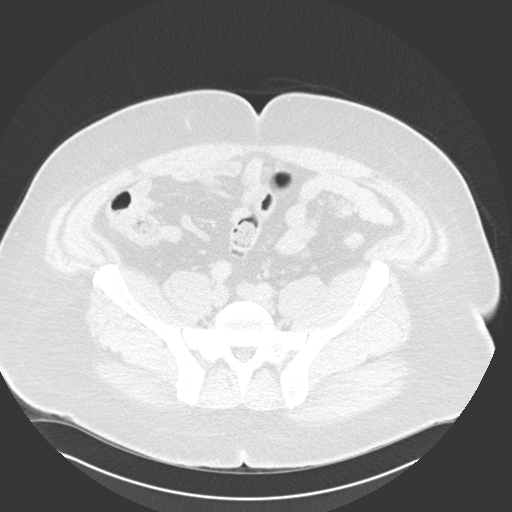
[im 62/67  soft-tissue]
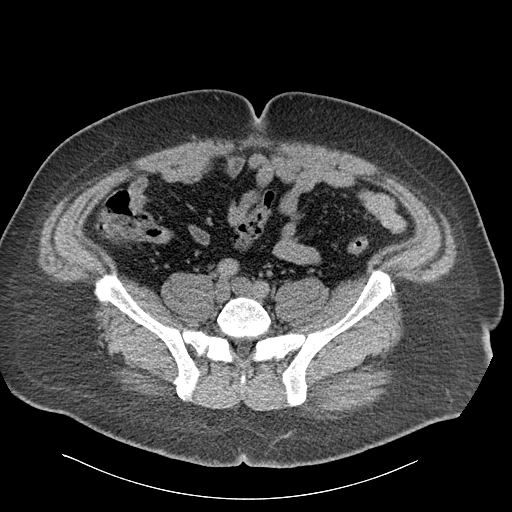
[im 62/67  lung]
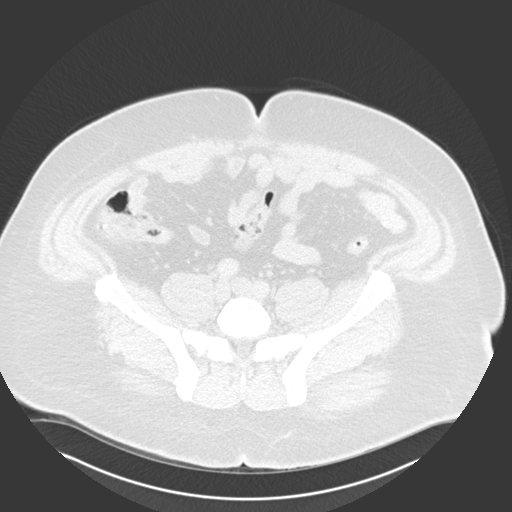
[im 64/67  lung]
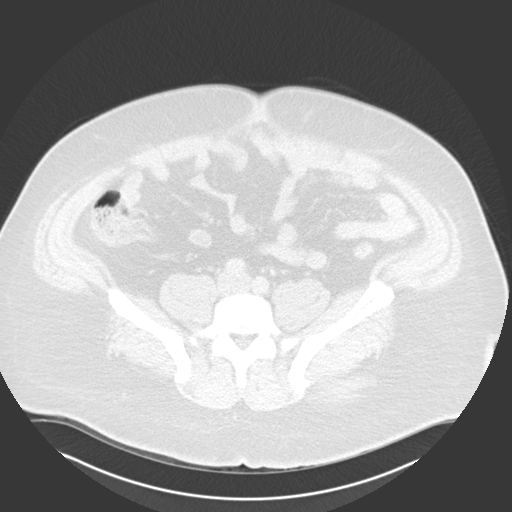

[Series 602: <mpr thick range> · coronal · 0.93mm/px · 3 of 105 slices shown]
[im 35/105  soft-tissue]
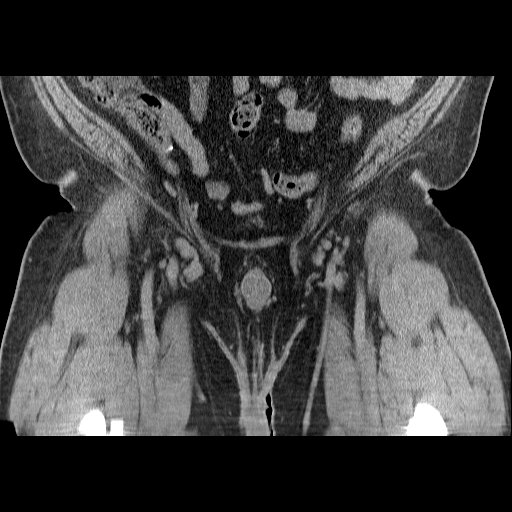
[im 47/105  soft-tissue]
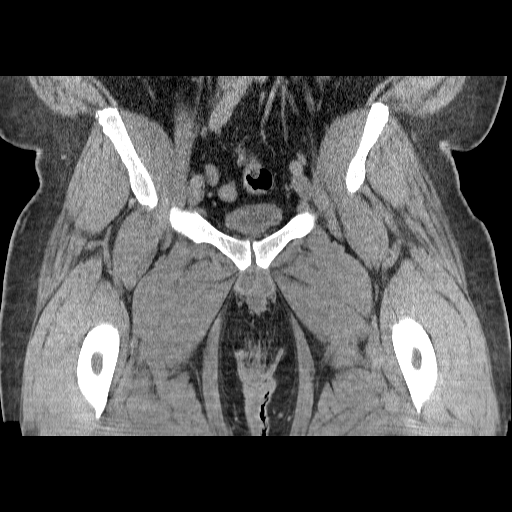
[im 58/105  soft-tissue]
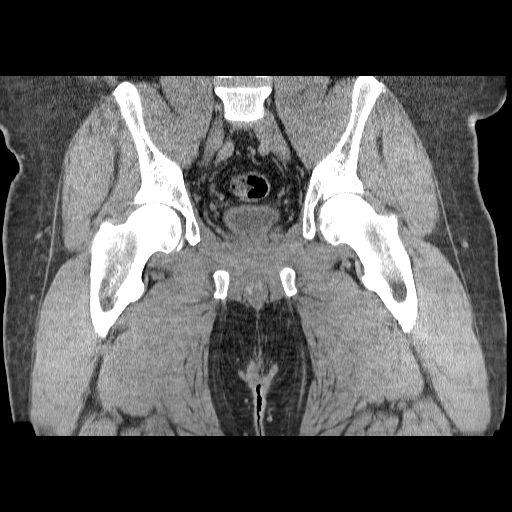

[Series 603: <mpr thick range(1)> · sagittal · 0.93mm/px · 1 of 147 slices shown]
[im 49/147  soft-tissue]
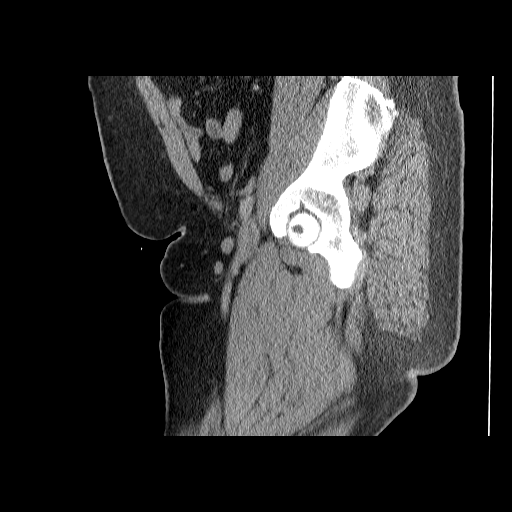

[17 of 46 positions shown; findings below may reference images not displayed]

FINDINGS: The there is no focal abscess identified in the
perineum.  There is perhaps mild thickening of the skin of the
right medial thigh and adjacent scrotum.  No intrapelvic pathology
identified.  Osseous structures intact.
IMPRESSION: 1.  Mild skin thickening of the medial thigh and perhaps adjacent
scrotum.
2.  No abscess or focal pathology.

## 2010-04-13 ENCOUNTER — Emergency Department (HOSPITAL_COMMUNITY): Admission: EM | Admit: 2010-04-13 | Discharge: 2010-04-13 | Payer: Self-pay | Admitting: Emergency Medicine

## 2010-08-30 ENCOUNTER — Ambulatory Visit: Payer: Self-pay | Admitting: Cardiovascular Disease

## 2010-08-30 ENCOUNTER — Observation Stay (HOSPITAL_COMMUNITY): Admission: EM | Admit: 2010-08-30 | Discharge: 2010-08-31 | Payer: Self-pay | Admitting: Emergency Medicine

## 2010-08-30 IMAGING — CR DG CHEST 1V PORT
1 series · 1 of 1 positions shown · non-contrast
Comparison: [DATE]

CLINICAL DATA: Chest pain

CHEST - 1 VIEW

[AP]
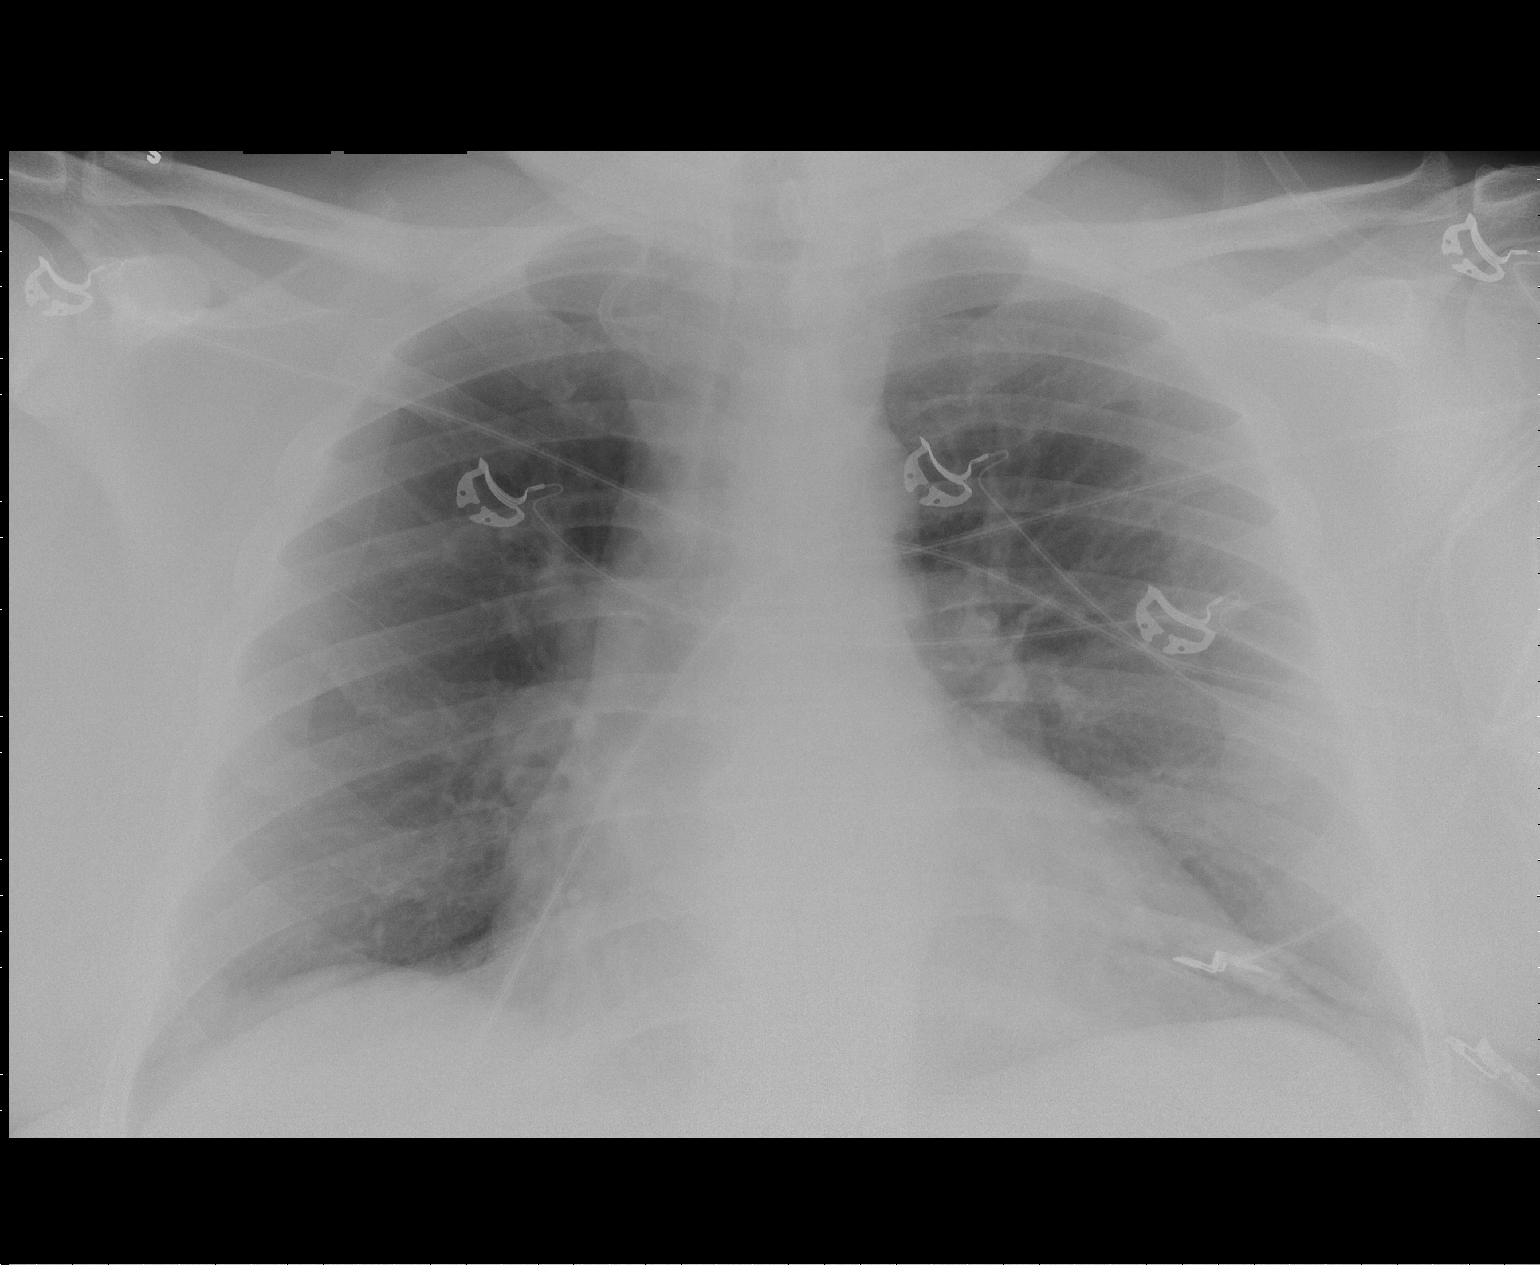

[1 of 1 positions shown; findings below may reference images not displayed]

FINDINGS: The heart size and mediastinal contours are within normal
limits.  Both lungs are clear.
IMPRESSION: No active disease.

## 2010-11-17 NOTE — Progress Notes (Signed)
Summary: triage  Phone Note Call from Patient Call back at [phone]   Caller: Patient Call For: Russella Dar Reason for Call: Talk to Nurse Summary of Call: Patient has questions regarding stool color Initial call taken by: Tawni Levy,  January 12, 2010 12:54 PM  Follow-up for Phone Call        Pt. has green formed stools x one week.Denies any abd.pain any changes in diet or meds.No nausea or vomiting or fever. Follow-up by: Teryl Lucy RN,  January 12, 2010 1:34 PM  Additional Follow-up for Phone Call Additional follow up Details #1::        Green color is not a concern if no diarrhea, pain, bleeding, constipation, N/V, etc. Additional Follow-up by: Meryl Dare MD FACG,  January 12, 2010 2:38 PM    Additional Follow-up for Phone Call Additional follow up Details #2::    Pt. contacted informed of Dr.Aldrin Engelhard's response.He denies any other GI symptoms. Follow-up by: Teryl Lucy RN,  January 12, 2010 2:50 PM

## 2010-11-22 ENCOUNTER — Telehealth: Payer: Self-pay | Admitting: Internal Medicine

## 2010-12-01 NOTE — Progress Notes (Signed)
Summary: DON'T SCHEDULE   ---- Converted from flag ---- ---- 11/17/2010 5:21 PM, Corwin Levins MD wrote: sorry, not able  ---- 11/17/2010 3:14 PM, Hilarie Fredrickson wrote: Mr. Palka wants to switch from Dr. Artist Pais to this office.  He has 5 no shows over several years in [facility].  He has [employer].  Will you take him as a patient? ------------------------------

## 2010-12-01 NOTE — Progress Notes (Signed)
Summary: DON'T SCHEDULE PT  ---- Converted from flag ---- ---- 11/17/2010 4:19 PM, Newt Lukes MD wrote: not me - thanks  ---- 11/17/2010 3:14 PM, Hilarie Fredrickson wrote: [name] wants to switch from Dr. Artist Pais to this office.  He has 5 no shows over several years in [employer].  He has [employer].  Will you take him as a patient? ------------------------------

## 2010-12-27 LAB — LIPID PANEL
LDL Cholesterol: 87 mg/dL (ref 0–99)
Total CHOL/HDL Ratio: 4.6 RATIO
Triglycerides: 181 mg/dL — ABNORMAL HIGH (ref <150)
VLDL: 36 mg/dL (ref 0–40)

## 2010-12-27 LAB — CBC
HCT: 43.1 % (ref 39.0–52.0)
RBC: 5.31 MIL/uL (ref 4.22–5.81)
RDW: 12.8 % (ref 11.5–15.5)
WBC: 12.4 10*3/uL — ABNORMAL HIGH (ref 4.0–10.5)

## 2010-12-27 LAB — CARDIAC PANEL(CRET KIN+CKTOT+MB+TROPI)
CK, MB: 1.7 ng/mL (ref 0.3–4.0)
CK, MB: 2.1 ng/mL (ref 0.3–4.0)
Relative Index: 1.6 (ref 0.0–2.5)
Total CK: 133 U/L (ref 7–232)
Troponin I: 0.02 ng/mL (ref 0.00–0.06)

## 2010-12-27 LAB — BASIC METABOLIC PANEL
BUN: 6 mg/dL (ref 6–23)
GFR calc Af Amer: >60 mL/min (ref >60)
GFR calc non Af Amer: >60 mL/min (ref >60)
Potassium: 3.6 mEq/L (ref 3.5–5.1)
Sodium: 134 mEq/L — ABNORMAL LOW (ref 135–145)

## 2010-12-27 LAB — DIFFERENTIAL
Basophils Absolute: 0 10*3/uL (ref 0.0–0.1)
Lymphocytes Relative: 25 % (ref 12–46)
Lymphs Abs: 3.1 10*3/uL (ref 0.7–4.0)
Monocytes Absolute: 1.3 10*3/uL — ABNORMAL HIGH (ref 0.1–1.0)
Neutro Abs: 7.4 10*3/uL (ref 1.7–7.7)

## 2010-12-27 LAB — RAPID URINE DRUG SCREEN, HOSP PERFORMED
Barbiturates: NOT DETECTED
Opiates: NOT DETECTED

## 2010-12-27 LAB — GLUCOSE, CAPILLARY

## 2010-12-27 LAB — COMPREHENSIVE METABOLIC PANEL
Albumin: 3.4 g/dL — ABNORMAL LOW (ref 3.5–5.2)
BUN: 7 mg/dL (ref 6–23)
Creatinine, Ser: 0.82 mg/dL (ref 0.4–1.5)
Total Protein: 6.7 g/dL (ref 6.0–8.3)

## 2010-12-27 LAB — POCT CARDIAC MARKERS
CKMB, poc: 1 ng/mL (ref 1.0–8.0)
CKMB, poc: <1 ng/mL — ABNORMAL LOW (ref 1.0–8.0)
Troponin i, poc: <0.05 ng/mL (ref 0.00–0.09)

## 2011-01-01 LAB — DIFFERENTIAL
Lymphs Abs: 1.3 10*3/uL (ref 0.7–4.0)
Monocytes Relative: 11 % (ref 3–12)
Neutro Abs: 5.1 10*3/uL (ref 1.7–7.7)
Neutrophils Relative %: 69 % (ref 43–77)

## 2011-01-01 LAB — GLUCOSE, CAPILLARY
Glucose-Capillary: 284 mg/dL — ABNORMAL HIGH (ref 70–99)
Glucose-Capillary: 350 mg/dL — ABNORMAL HIGH (ref 70–99)

## 2011-01-01 LAB — WOUND CULTURE

## 2011-01-01 LAB — BASIC METABOLIC PANEL
Calcium: 8.8 mg/dL (ref 8.4–10.5)
GFR calc Af Amer: >60 mL/min (ref >60)
GFR calc non Af Amer: >60 mL/min (ref >60)
Sodium: 134 mEq/L — ABNORMAL LOW (ref 135–145)

## 2011-01-01 LAB — CBC
Hemoglobin: 14.6 g/dL (ref 13.0–17.0)
RBC: 5.05 MIL/uL (ref 4.22–5.81)
WBC: 7.5 10*3/uL (ref 4.0–10.5)

## 2011-01-01 LAB — RPR: RPR Ser Ql: NONREACTIVE

## 2011-01-04 LAB — DIFFERENTIAL
Basophils Absolute: 0 10*3/uL (ref 0.0–0.1)
Basophils Relative: 0 % (ref 0–1)
Eosinophils Absolute: 0.1 10*3/uL (ref 0.0–0.7)
Eosinophils Relative: 1 % (ref 0–5)
Lymphocytes Relative: 5 % — ABNORMAL LOW (ref 12–46)
Lymphs Abs: 0.8 10*3/uL (ref 0.7–4.0)
Monocytes Absolute: 1.1 10*3/uL — ABNORMAL HIGH (ref 0.1–1.0)
Monocytes Relative: 7 % (ref 3–12)
Neutro Abs: 14.8 10*3/uL — ABNORMAL HIGH (ref 1.7–7.7)
Neutrophils Relative %: 88 % — ABNORMAL HIGH (ref 43–77)

## 2011-01-04 LAB — URINALYSIS, ROUTINE W REFLEX MICROSCOPIC
Leukocytes, UA: NEGATIVE
Nitrite: NEGATIVE
Protein, ur: 100 mg/dL — AB
Specific Gravity, Urine: 1.022 (ref 1.005–1.030)
Urobilinogen, UA: 0.2 mg/dL (ref 0.0–1.0)

## 2011-01-04 LAB — COMPREHENSIVE METABOLIC PANEL
Alkaline Phosphatase: 70 U/L (ref 39–117)
BUN: 7 mg/dL (ref 6–23)
CO2: 30 mEq/L (ref 19–32)
GFR calc non Af Amer: >60 mL/min (ref >60)
Glucose, Bld: 172 mg/dL — ABNORMAL HIGH (ref 70–99)
Potassium: 4.2 mEq/L (ref 3.5–5.1)
Total Protein: 8.1 g/dL (ref 6.0–8.3)

## 2011-01-04 LAB — GLUCOSE, CAPILLARY
Glucose-Capillary: 167 mg/dL — ABNORMAL HIGH (ref 70–99)
Glucose-Capillary: 231 mg/dL — ABNORMAL HIGH (ref 70–99)
Glucose-Capillary: 236 mg/dL — ABNORMAL HIGH (ref 70–99)

## 2011-01-04 LAB — CBC
HCT: 46.6 % (ref 39.0–52.0)
Hemoglobin: 16.1 g/dL (ref 13.0–17.0)
MCHC: 34.5 g/dL (ref 30.0–36.0)
MCV: 84.2 fL (ref 78.0–100.0)
Platelets: 278 10*3/uL (ref 150–400)
RBC: 5.54 MIL/uL (ref 4.22–5.81)
RDW: 13.5 % (ref 11.5–15.5)
WBC: 16.8 10*3/uL — ABNORMAL HIGH (ref 4.0–10.5)

## 2011-01-04 LAB — URINE MICROSCOPIC-ADD ON

## 2011-01-25 LAB — GLUCOSE, CAPILLARY
Glucose-Capillary: 125 mg/dL — ABNORMAL HIGH (ref 70–99)
Glucose-Capillary: 163 mg/dL — ABNORMAL HIGH (ref 70–99)
Glucose-Capillary: 207 mg/dL — ABNORMAL HIGH (ref 70–99)
Glucose-Capillary: 210 mg/dL — ABNORMAL HIGH (ref 70–99)
Glucose-Capillary: 213 mg/dL — ABNORMAL HIGH (ref 70–99)
Glucose-Capillary: 239 mg/dL — ABNORMAL HIGH (ref 70–99)
Glucose-Capillary: 242 mg/dL — ABNORMAL HIGH (ref 70–99)
Glucose-Capillary: 250 mg/dL — ABNORMAL HIGH (ref 70–99)
Glucose-Capillary: 280 mg/dL — ABNORMAL HIGH (ref 70–99)

## 2011-01-25 LAB — POCT CARDIAC MARKERS
Myoglobin, poc: 102 ng/mL (ref 12–200)
Myoglobin, poc: 64.4 ng/mL (ref 12–200)
Troponin i, poc: <0.05 ng/mL (ref 0.00–0.09)

## 2011-01-25 LAB — CBC
HCT: 43.6 % (ref 39.0–52.0)
HCT: 44.1 % (ref 39.0–52.0)
HCT: 44.5 % (ref 39.0–52.0)
HCT: 47.6 % (ref 39.0–52.0)
Hemoglobin: 14.8 g/dL (ref 13.0–17.0)
MCV: 83.5 fL (ref 78.0–100.0)
MCV: 84.2 fL (ref 78.0–100.0)
MCV: 84.5 fL (ref 78.0–100.0)
Platelets: 249 10*3/uL (ref 150–400)
RBC: 5.14 MIL/uL (ref 4.22–5.81)
RBC: 5.24 MIL/uL (ref 4.22–5.81)
RBC: 5.27 MIL/uL (ref 4.22–5.81)
RBC: 5.7 MIL/uL (ref 4.22–5.81)
RDW: 12.7 % (ref 11.5–15.5)
WBC: 7.4 10*3/uL (ref 4.0–10.5)
WBC: 8.4 10*3/uL (ref 4.0–10.5)
WBC: 9.6 10*3/uL (ref 4.0–10.5)
WBC: 9.7 10*3/uL (ref 4.0–10.5)

## 2011-01-25 LAB — TSH: TSH: 1.825 u[IU]/mL (ref 0.350–4.500)

## 2011-01-25 LAB — DIFFERENTIAL
Eosinophils Absolute: 0.2 10*3/uL (ref 0.0–0.7)
Eosinophils Relative: 2 % (ref 0–5)
Lymphocytes Relative: 20 % (ref 12–46)
Lymphs Abs: 1.9 10*3/uL (ref 0.7–4.0)
Monocytes Relative: 8 % (ref 3–12)
Neutrophils Relative %: 70 % (ref 43–77)

## 2011-01-25 LAB — RAPID URINE DRUG SCREEN, HOSP PERFORMED
Amphetamines: NOT DETECTED
Opiates: NOT DETECTED
Tetrahydrocannabinol: NOT DETECTED

## 2011-01-25 LAB — HEPARIN LEVEL (UNFRACTIONATED)
Heparin Unfractionated: 0.25 IU/mL — ABNORMAL LOW (ref 0.30–0.70)
Heparin Unfractionated: 0.84 IU/mL — ABNORMAL HIGH (ref 0.30–0.70)

## 2011-01-25 LAB — BASIC METABOLIC PANEL
BUN: 12 mg/dL (ref 6–23)
BUN: 9 mg/dL (ref 6–23)
CO2: 23 mEq/L (ref 19–32)
Chloride: 101 mEq/L (ref 96–112)
Chloride: 102 mEq/L (ref 96–112)
Creatinine, Ser: 0.72 mg/dL (ref 0.4–1.5)
GFR calc Af Amer: >60 mL/min (ref >60)
GFR calc Af Amer: >60 mL/min (ref >60)
GFR calc non Af Amer: >60 mL/min (ref >60)
GFR calc non Af Amer: >60 mL/min (ref >60)
Glucose, Bld: 180 mg/dL — ABNORMAL HIGH (ref 70–99)
Potassium: 3.7 mEq/L (ref 3.5–5.1)
Potassium: 3.8 mEq/L (ref 3.5–5.1)
Sodium: 138 mEq/L (ref 135–145)

## 2011-01-25 LAB — PROTIME-INR: INR: 1.1 (ref 0.00–1.49)

## 2011-01-25 LAB — CK TOTAL AND CKMB (NOT AT ARMC)
CK, MB: 2.6 ng/mL (ref 0.3–4.0)
Total CK: 217 U/L (ref 7–232)

## 2011-01-25 LAB — CARDIAC PANEL(CRET KIN+CKTOT+MB+TROPI): Troponin I: <0.01 ng/mL (ref 0.00–0.06)

## 2011-01-30 LAB — GLUCOSE, CAPILLARY: Glucose-Capillary: 202 mg/dL — ABNORMAL HIGH (ref 70–99)

## 2011-02-03 ENCOUNTER — Emergency Department (HOSPITAL_COMMUNITY)
Admission: EM | Admit: 2011-02-03 | Discharge: 2011-02-03 | Disposition: A | Discharge status: Home / Self Care | Payer: Self-pay | Attending: Emergency Medicine | Admitting: Emergency Medicine

## 2011-02-03 ENCOUNTER — Emergency Department (HOSPITAL_COMMUNITY): Payer: Self-pay

## 2011-02-03 DIAGNOSIS — M25519 Pain in unspecified shoulder: Secondary | ICD-10-CM | POA: Insufficient documentation

## 2011-02-03 DIAGNOSIS — Z79899 Other long term (current) drug therapy: Secondary | ICD-10-CM | POA: Insufficient documentation

## 2011-02-03 DIAGNOSIS — M25619 Stiffness of unspecified shoulder, not elsewhere classified: Secondary | ICD-10-CM | POA: Insufficient documentation

## 2011-02-03 DIAGNOSIS — Y929 Unspecified place or not applicable: Secondary | ICD-10-CM | POA: Insufficient documentation

## 2011-02-03 DIAGNOSIS — E119 Type 2 diabetes mellitus without complications: Secondary | ICD-10-CM | POA: Insufficient documentation

## 2011-02-03 DIAGNOSIS — I10 Essential (primary) hypertension: Secondary | ICD-10-CM | POA: Insufficient documentation

## 2011-02-03 DIAGNOSIS — X500XXA Overexertion from strenuous movement or load, initial encounter: Secondary | ICD-10-CM | POA: Insufficient documentation

## 2011-02-03 IMAGING — CR DG SHOULDER 2+V*R*
3 series · 3 of 3 positions shown · non-contrast
Comparison: None.

CLINICAL DATA: Right shoulder pain after work injury.

RIGHT SHOULDER - 2+ VIEW

[w shoulder ap internal righ *]
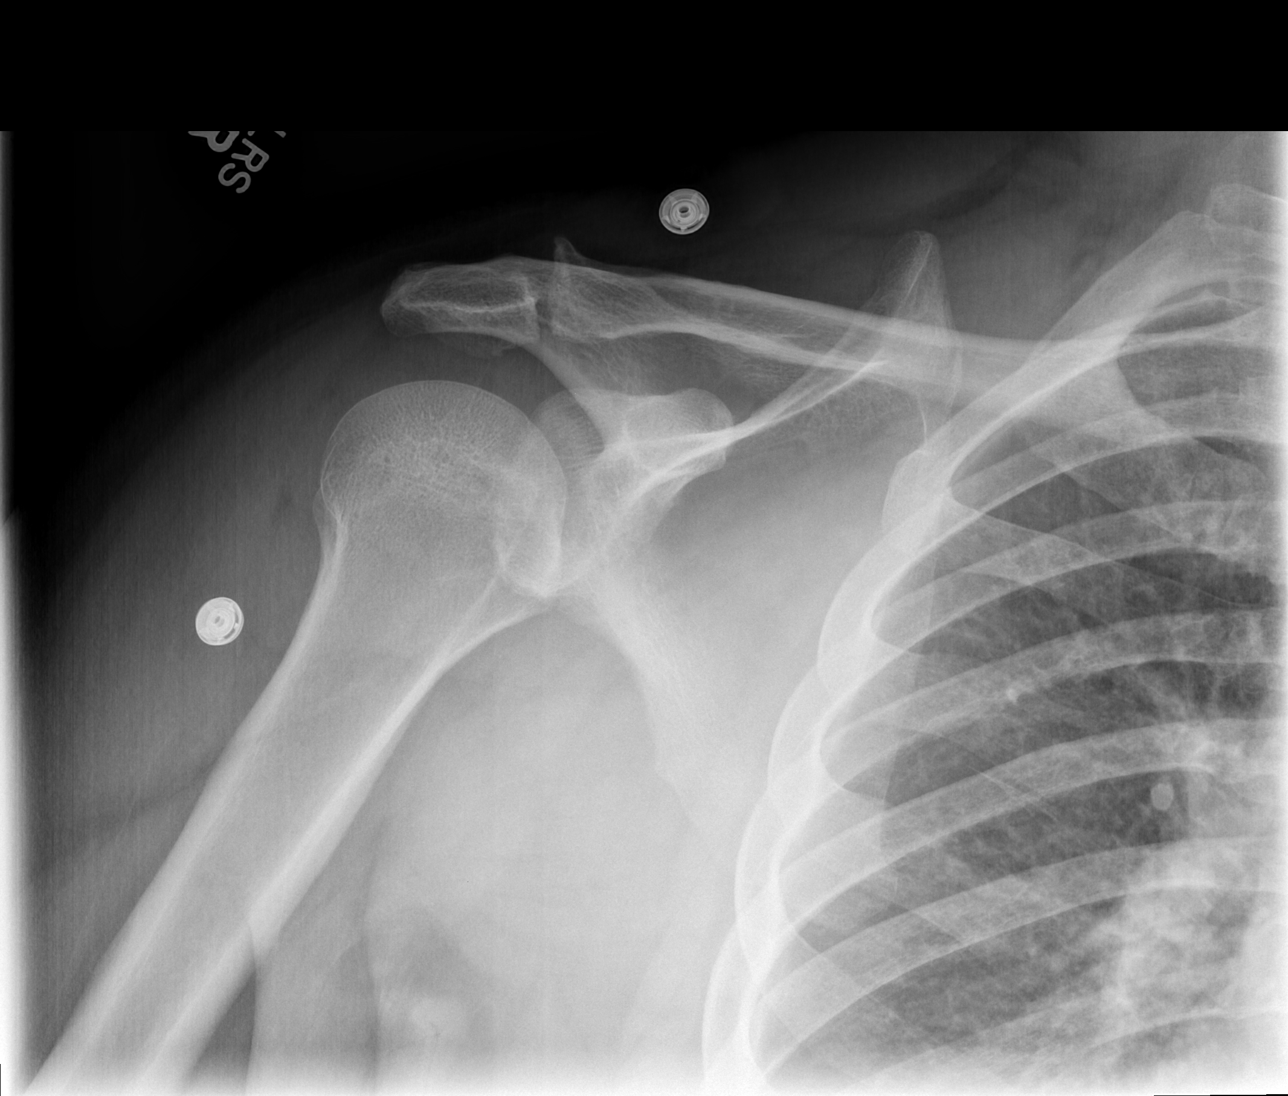

[w shoulder ap external righ]
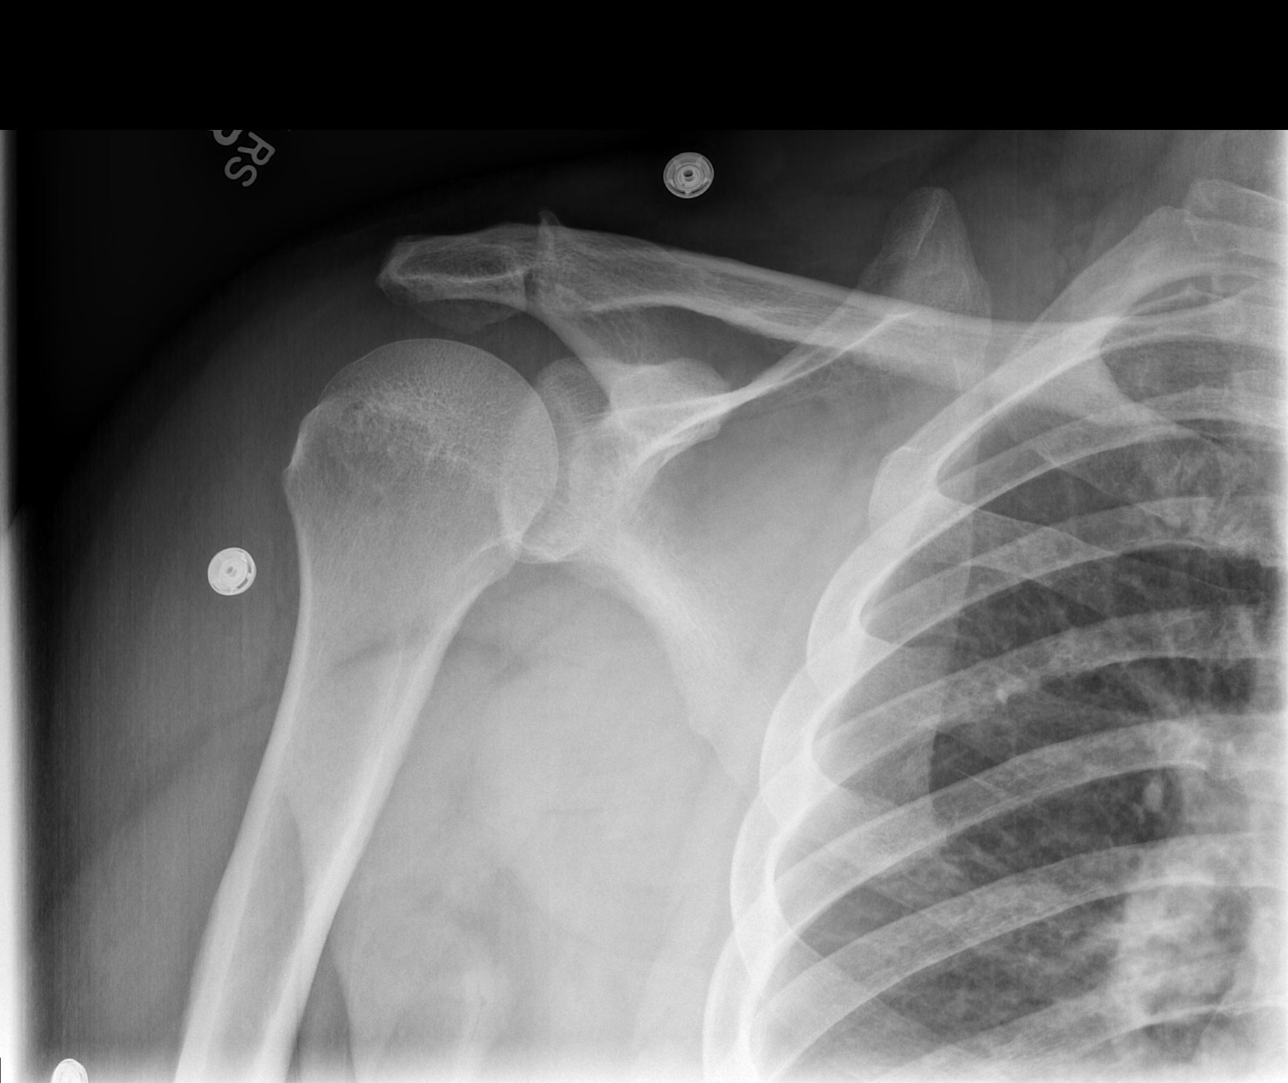

[w shoulder y view right *]
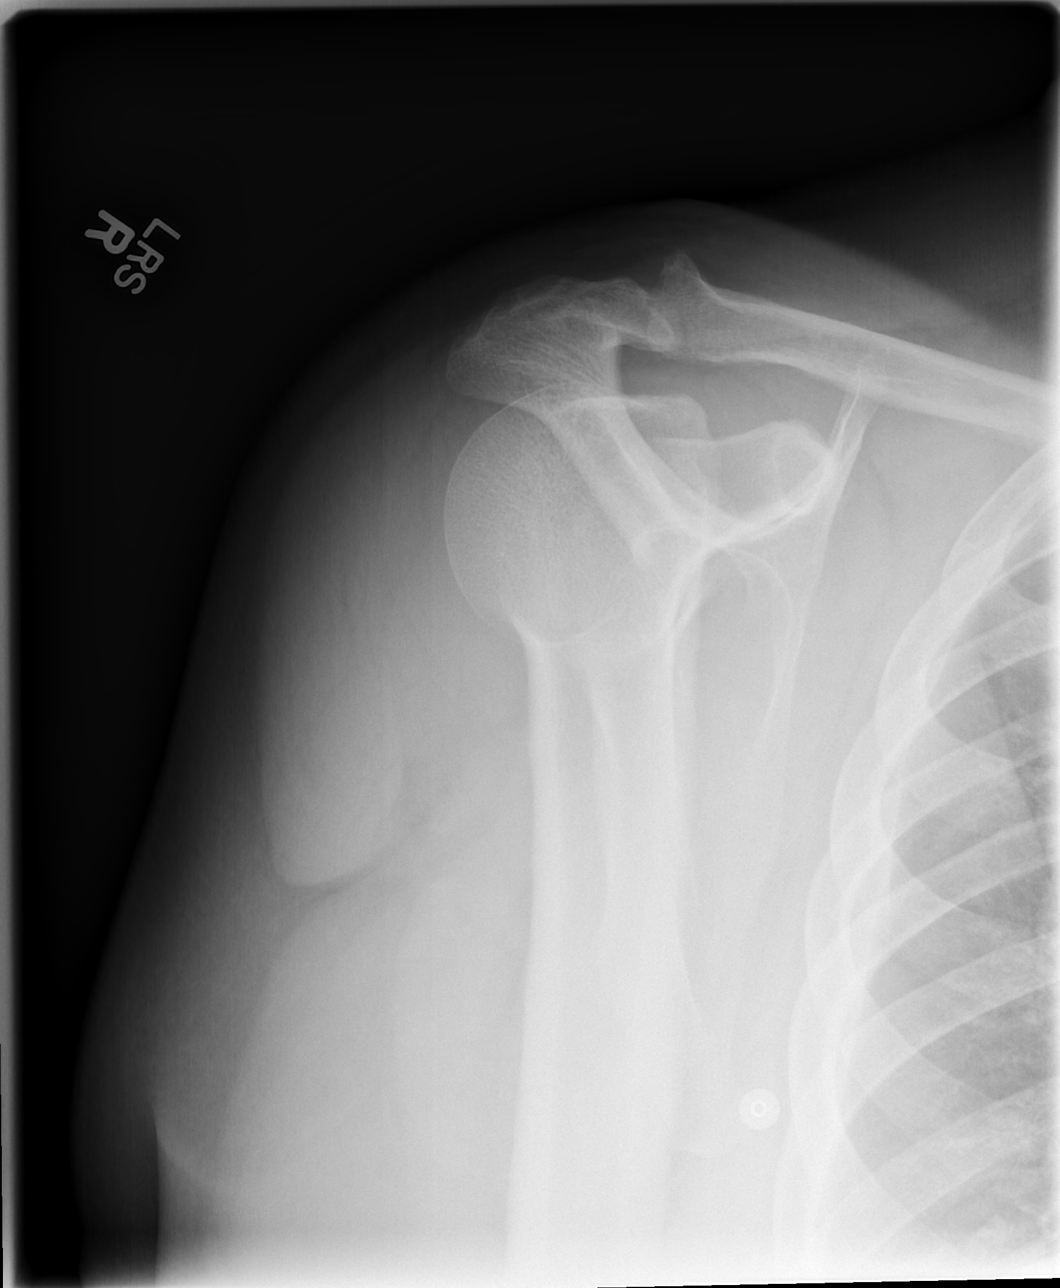

[3 of 3 positions shown; findings below may reference images not displayed]

FINDINGS: Degenerative changes in the acromioclavicular joint. No
evidence of acute fracture or subluxation.  No focal bone lesions.
Bone matrix and cortex appear intact.  No abnormal radiopaque
densities in the soft tissues.
IMPRESSION: No evidence of acute fracture or subluxation.  Degenerative changes
in the acromioclavicular joint.

## 2011-02-15 ENCOUNTER — Ambulatory Visit (INDEPENDENT_AMBULATORY_CARE_PROVIDER_SITE_OTHER): Payer: PRIVATE HEALTH INSURANCE | Admitting: Family Medicine

## 2011-02-15 ENCOUNTER — Encounter: Payer: Self-pay | Admitting: Family Medicine

## 2011-02-15 VITALS — BP 130/85 | Ht 69.0 in | Wt 330.0 lb

## 2011-02-15 DIAGNOSIS — M25519 Pain in unspecified shoulder: Secondary | ICD-10-CM

## 2011-02-15 DIAGNOSIS — M25511 Pain in right shoulder: Secondary | ICD-10-CM

## 2011-02-15 MED ORDER — IBUPROFEN 800 MG PO TABS: 800.0000 mg | ORAL_TABLET | Freq: Three times a day (TID) | ORAL | Status: AC | PRN

## 2011-02-15 MED ORDER — IBUPROFEN 800 MG PO TABS: 800.0000 mg | ORAL_TABLET | Freq: Three times a day (TID) | ORAL | Status: DC | PRN

## 2011-02-15 NOTE — Progress Notes (Signed)
Subjective:    Patient ID: [name], male    DOB: Jun 01, 1971, 40 y.o.   MRN: [id number]  HPI 40 year old male referred by Kathrine Haddock and Lenny Pastel from occupational health for evaluation and treatment of right shoulder pain. 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 He states on April 11 while lifting a 3 pound cooler across his body, he felt a pain in his shoulder. He denies feeling a loud pop or tear. Since then it is a fairly consistent pain that's difficult for him to localize, but seems to be worse in the posterior aspect of the shoulder. He denies any obvious catching or popping. He informed his supervisor and has been followed at the occupational health clinic. He did get a subacromial steroid injection on April 12. He states this provided short-term relief very well for about 3 days and the pain returned. A few days later he actually went to the emergency room where x-rays taken that showed no significant acute findings. He has been on limited duty with no push pull lift greater than 10 pounds, no overhead work, and no driving. Occupational health is concerned about partial rotator cuff tear and would like [place] to further evaluate him at this time. Upon questioning, the patient states that he is greater than 50% better in regard to his pain at this time.  Past medical history: Diabetes, sleep apnea, obesity, A. fib Past surgical history: Appendectomy, tonsillectomy, adenoidectomy Medications include metformin, glipizide, and diltiazem. For this particular problem has been on ibuprofen and Endocet Drug allergies denies  Review of Systems Denies fever, sweats, chills, weight loss    Objective:   Physical Exam Gen. appearance: Obese male no distress   Neck: Supple Psych: Normal affect Neuro alert and oriented ENT moist these memories Lungs: The Librium Abdomen: Soft Right shoulder appears to have fairly normal range  of motion in all planes. Negative empty can, mildly positive Hawkins, negative Neer's. Rotator cuff strength appears to be intact diffusely. He has a pain with O'Brien's test, but causes more posterior pain and anterior pain. No tenderness of the biceps tendon, negative speeds and Yergason's. He does have significant pain with resisted deltoid abduction. Normal strength of triceps. No significant a.c. tenderness or cross adduction test.  Radiography: Reviewed 3 views of the right shoulder, which showed degenerative changes of the a.c. joint. 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 No evidence of glenohumeral arthritis or other acute pathology.  MSK ultrasound of right shoulder: overall difficult exam given his obese body habitus. Biceps tendon may be partially subluxed but no obvious evidence of tear or edema. Subscapularis does seem to show some possible articular surface tearing as well as some subacromial bursitis with fluid. He also may have an articular surface tear of the supraspinatus with some edema. I was unable to assess his infraspinatus and teres minor, and I was also unable to fully assess his deltoid muscle.   Assessment & Plan:  Worker's Compensation related injury to the right shoulder dated 01/25/2011 with persistent right shoulder pain. Mechanism is concerning for biceps tendon injury, rotator cuff injury, or labral injury. Would also consider a deltoid strain or tear. However, at this time the patient states he is 50% better and his exam is somewhat inconsistent with his history and US exam. -He does have some mild pain weakness with deltoid abduction on exam, but really his rotator cuff strength appears to be intact. Despite this  we will take him off all lifting duties at this time and will do seated work with a 10 pound maximum of pushing pulling or lifting. His to do no overhead activities. He was given a work note reflecting this today. -Referral to physical therapy. I told him he must  attend 2-3 visits before he comes back to see me in 2 weeks. -I declined to refill any narcotic pain medicines for him. Did refill his ibuprofen 800 milligrams 3 times a day. -Followup with me in 2 weeks, if not significantly better at that time I would consider getting an MRI scan, especially considering he is a very difficult ultrasound exam to obtain because of his body habitus. However I'm not doing at this point as he does state his pain is 50% improved. If in 2 weeks his pain is greater than 75% improved after therapy, I would then consider increasing his upper extremity restrictions. If he has not improved, then would definitely consider MRI scan. -At the end of the visit, he did ask me for the rest of the day off. I declined this and stated there is no reason to give him the rest of the day off for a morning office visit.

## 2011-02-28 NOTE — H&P (Signed)
[name], [name] NO.:  [id number]   MEDICAL RECORD NO.:  [id number]          PATIENT TYPE:  EMS   LOCATION:  [facility]                         FACILITY:  [facility]   PHYSICIAN:  Elmore Guise., M.D.DATE OF BIRTH:  03/01/1971   DATE OF ADMISSION:  05/06/2007  DATE OF DISCHARGE:                              HISTORY & PHYSICAL   INDICATION FOR ADMISSION:  Atrial fibrillation.   HISTORY OF PRESENT ILLNESS:  The patient is a 40 year old African  American male with past medical history of morbid obesity, diabetes  mellitus, hypertension, dyslipidemia and substance abuse who presents  with palpitations, shortness of breath and fatigue.  The patient  actually reports four prior episodes, each episode lasting anywhere  between 1 and 2 hours.  His first episode happened some time in March.  At that time, it was associated with increased activity and smoking.  He  stated he noticed his heart racing and skipping.  He became acutely  short of breath.  He went home to rest, and it went away.  He has had  three subsequent spells, all very similar.  He does report he quit  smoking after his last spell.  He does have history of cocaine use.  He  quit approximately 3 months ago.  Today while working, he had acute  onset of shortness of breath and palpitations with my heart racing.  His symptoms continue.  He then came to the emergency room for further  evaluation.  On arrival here, his heart rate was in the 150s, showing  atrial fibrillation with rapid ventricular response.  The patient was  given IV Cardizem with rate control.  He is currently resting  comfortably, lying supine in bed without any distress.  He does report  malaise and joint pains over the last couple months as well as  occasional lower extremity edema, none here recently.  No fever, no  cough.  Does have history of cocaine but denies any usage in the last 3  months.  He has been smoking tobacco for 15 years and  off and on for the  last 3 months.  He does have occasional alcohol use but none here  recently.   His medications are Diovan, Glucophage and Lipitor which he is not  taking anymore.   ALLERGIES:  None.   FAMILY HISTORY:  Positive for heart disease, diabetes mellitus and  cancer.   SOCIAL HISTORY:  Single.  History of tobacco and alcohol and history of  cocaine.   PHYSICAL EXAMINATION:  He is afebrile.  Blood pressure 112/60, heart  rate 100, showing atrial fibrillation on monitor.  He is saturating 96%  on room air.  GENERAL:  He is very pleasant, young-appearing, African-American male  alert and oriented x4 in no acute distress.  He has no JVD and no bruits.  LUNGS:  Were clear with decreased breath sounds in the bases.  HEART:  Is irregular regular with normal S1-S2.  Soft flow murmur noted.  ABDOMEN:  Is obese, soft, nontender, nondistended.  No rebound or  guarding.  EXTREMITIES:  Are warm with 2+  pulses and trace pretibial edema.   His lab work shows a BUN and creatinine of 10 and 1.0, potassium of 4.1,  glucose 306.  Myoglobin was 169, MB of less than 1.0.  Troponin I less  than 0.05, PT/INR 13.7 and 1.0 with a PTT of 30.  Chest x-ray shows low  lung volumes with cardiomegaly, and ECG shows atrial fibrillation with a  rapid ventricular response, a heart rate of 136 per minute with  nonspecific ST-T wave changes.   IMPRESSION:  1. Recurrent atrial fibrillation.  2. Morbid obesity.  3. Hypertension, diabetes mellitus, likely obstructive sleep apnea.  4. History of substance abuse.   PLAN:  The patient will be admitted to telemetry monitoring.  We will  continue IV Cardizem, aspirin and the patient will be given Lovenox 1  mg/kg subcutaneous x1 dose tonight.  He will have serial cardiac  enzymes, TSH and a D-dimer performed.  We will check an echo in the  morning.  He will also have a urine drug screen and alcohol level.  For  his diabetes, he will be placed on  sliding scale insulin.  We will  continue his Glucophage.  All of this was explained to the patient, and  he understands our treatment at this time.      Elmore Guise., [name]  Electronically Signed     TWK/MEDQ  D:  05/06/2007  T:  05/07/2007  Job:  [id number]

## 2011-02-28 NOTE — Assessment & Plan Note (Signed)
[facility]                           PRIMARY CARE OFFICE NOTE   [name], [name]                     MRN:          [id number]  DATE:07/04/2007                            DOB:          15-Apr-1971    CHIEF COMPLAINT:  New patient to practice/type 2 diabetes.   HISTORY OF PRESENT ILLNESS:  Patient is a 40 year old African American  male here to establish primary care. He was recently hospitalized on  May 06, 2007 secondary to atrial fibrillation with rapid ventricular  response. He was started on Cardizem drip and given an amiodarone bolus  with a resolution conversion to normal sinus rhythm. He is noted to have  type 2 diabetes for greater than 8 years. He notes previous A1C at 10.6.  He admits to poor dietary compliance and has not been taking his  medications on a regular basis. Upon hospital discharged he was  restarted on Glucophage 1000 mg twice daily and Glucotrol XL 10 mg once  daily. He does not have a Glucometer and has not been checking his blood  sugars on a regular basis.   PAST MEDICAL HISTORY SUMMARY:  1. Type 2 diabetes with morbid obesity.  2. Hypertension.  3. History of obstructive sleep apnea.  4. Dyslipidemia.  5. Atrial fibrillation.  6. History of substance abuse/cocaine abuse.   CURRENT MEDICATIONS:  1. Diltiazem CD 240 mg once a day.  2. Diovan 80 mg once a day.  3. Glipizide 10 mg once a day.  4. Lipitor 40 mg one half a day.  5. Metformin 1000 mg b.i.d.  6. Multivitamin 1 a day.   ALLERGIES TO MEDICINES:  None known.   SOCIAL HISTORY:  The patient is single, remote history of tobacco and  alcohol and cocaine abuse as noted above. He denies any use of crack  cocaine or IV drug use.   FAMILY HISTORY:  Positive for multiple family members with type 2  diabetes and heart disease.   REVIEW OF SYSTEMS:  Patient denies any current palpitations, no recent  chest pain, has dyspnea on exertion. He does not exercise  on a regular  basis. He notes occasional heartburn. No dysuria, or frequency, or  urgency. He has not had a diabetic eye exam within the last 1 to 2  years. He notes burning sensation underneath his right foot. He notes  loose stools with some of his medications.   PHYSICAL EXAMINATION:  VITALS: Height is 5 feet 9 inches, weight is 336  pounds, temperature is 98.1, pulse is 88, blood pressure is 140/90 left  arm in a seated position.  IN GENERAL: The patient is a pleasant morbidly obese 40 year old African  American male in no apparent distress.  HEENT: Normocephalic, atraumatic. Pupils are equal and reactive to light  bilaterally. Extraocular motility was intact. Patient was anicteric.  Conjunctivae was within normal limits. External auditory canals and  tympanic membranes are clear bilaterally. Oral pharyngeal exam was  unremarkable. Normal dentition.  NECK: Thick, could not appreciate any adenopathy or carotid bruits.  CHEST EXAM: Normal expiratory efforts.  Chest is clear to auscultation  bilaterally. No rhonchi, rales, or wheezing.  CARDIOVASCULAR: Regular rate and rhythm. No significant murmurs, rubs,  or gallops appreciated.  ABDOMEN: Protuberant but not tender. Positive bowel sounds, unable to  appreciate any organomegaly.  MUSCULOSKELETAL EXAM: No clubbing, cyanosis. No edema. He had palpable  pedis dorsalis pulses bilaterally but slightly diminished temperature  sense and vibration sense. He was able to sense monofilaments.  Otherwise neurologically cranial nerves II-XII grossly intact. He was  non focal.   IMPRESSION/RECOMMENDATIONS:  1. Type 2 diabetes uncontrolled with a poor compliance.  2. Hypertension suboptimally controlled.  3. Hyperlipidemia.  4. History of obstructive sleep apnea.  5. Health maintenance.   RECOMMENDATIONS:  I suspect that patient's metformin is causing his  loose stools and nausea. His metformin will be changed to Glucophage ER  500 mg 2  tablets b.i.d.  We will obtain follow up labs including micro albumin creatinine ratio,  and lipid panel, and LFTs. His LDL goal is less than 70.  Reviewed briefly components of a diabetic diet. I recommend following up  with diabetic educator for further diabetic teaching.  He was provided with a new One Touch Glucometer today. He is to maintain  a log of his blood sugars until a follow up visit in approximately 2  weeks.  We discussed micro and macular potential complications of untreated type  2 diabetes.     Barbette Hair. Artist Pais, DO  Electronically Signed    RDY/MedQ  DD: 07/05/2007  DT: 07/05/2007  Job #: [id number]

## 2011-02-28 NOTE — H&P (Signed)
NAMERENTON, BERKLEY NO.:  [id number]   MEDICAL RECORD NO.:  [id number]          PATIENT TYPE:  EMS   LOCATION:  MAJO                         FACILITY:  [facility]   PHYSICIAN:  Unice Cobble, MD     DATE OF BIRTH:  Feb 22, 1971   DATE OF ADMISSION:  01/23/2009  DATE OF DISCHARGE:                              HISTORY & PHYSICAL   CARDIOLOGIST:  Dr. Reyes Ivan of the [facility] group.   CHIEF COMPLAINT:  Atrial fibrillation with rapid ventricular response.   HISTORY OF PRESENT ILLNESS:  This is a 40 year old African American male  with a history of hypertension, hyperlipidemia, diabetes, obstructive  sleep apnea, and atrial fibrillation who presents with atrial  fibrillation with rapid ventricular response.  The patient tells me he  ran out of diltiazem on Monday.  He was working in his yard at 2:00 p.m.  when he felt lethargic.  He then became sweaty and lightheaded.  He had  some palpitations and he recognized this as atrial fibrillation and came  into the emergency room.  He did not experience any chest pain.  No  orthopnea, PND, or edema.  He otherwise feels well.   PAST MEDICAL HISTORY:  1. Obesity.  2. Diabetes.  3. Hypertension.  4. Obstructive sleep apnea on CPAP.  5. Hyperlipidemia.  6. Atrial fibrillation.  7. Last echocardiogram was July 2008 which showed normal ejection      fraction of at 55% to 60% and normal size left atrium.   ALLERGIES:  No known drug allergies.   MEDICATIONS:  1. Rythmol 225 mg daily.  2. Diltiazem CD 240 240 mg daily.  3. Diovan 80 mg daily.  4. Metformin 1000 mg b.i.d.  5. Glipizide 10 mg daily.  6. Lipitor 20 mg daily.   SOCIAL HISTORY:  Lives with his sister and her husband here in  [place].  He works for the [employer].  He is a  former smoker.  No alcohol.  He denies using drugs at present but admits  to using cocaine 2 to 3 years ago.   FAMILY HISTORY:  His mother had a heart attack.  His  father also had  heart attack (this heart attack at the age of 35 - he was a smoker).   REVIEW OF SYSTEMS:  A complete review of systems done and found to be  otherwise negative except as stated in the HPI.   PHYSICAL EXAMINATION:  VITAL SIGNS:  Temperature 98.1, pulse of 168  falling to 116, respiratory rate 24, blood pressure 132/87, O2 sats 95%  on room air.  GENERAL:  This is a very obese African American male in no acute  distress.  HEENT: Shows PERRLA, EOMI, MMM, normal dentition, oropharynx without  erythema or exudates.  NECK:  Supple without lymphadenopathy, thyromegaly, bruits or jugular  venous distention.  HEART:  Has a irregular irregular rate and rhythm without murmurs,  gallops or rubs.  LUNGS:  Clear to auscultation bilaterally.  ABDOMEN:  Soft and nontender with normal bowel sounds.  No rebound or  guarding.  EXTREMITIES:  Show no  cyanosis, clubbing or edema.  MUSCULOSKELETAL:  Shows no joint deformity, effusions or spine or CVA  tenderness.  NEUROLOGICALLY:  He is alert and oriented x3 with cranial nerves II-XII  grossly intact.  Strength is 5/5 all extremities and axial groups.   LABORATORY AND RADIOLOGY REVIEW:  Chest x-ray shows no acute  cardiopulmonary disease.  EKG shows a rate of 158 and atrial  fibrillation.  There are some PVCs present.  No ischemic changes.  Labs  show normal white count and hemoglobin with creatinine of 0.9 and a  glucose of 298.  His CK-MB and troponin are normal.   ASSESSMENT/PLAN:  This is a 40 year old African American male with  recurrence of atrial fibrillation with rapid ventricular rate secondary  to medication noncompliance.  His last echocardiogram showed no left  atrial enlargement and his TSH has been normal in the past.  His  etiology is likely related to habitus and obstructive sleep apnea  although ischemic etiology has not been ruled out.  We will rule out for  MI tonight with enzymes and EKGs.  I will load him with  some p.o.  diltiazem and start his long-acting diltiazem again in the morning.  The  drip will be continued otherwise.  He may need DC cardioversion  tomorrow.  His CHADS  score is 2 for hypertension and diabetes making  Coumadin a possibility in his treatment.  It is my understanding he was  on aspirin due to his youth.  Regardless, I will heparinize him tonight  and allow that decision to be made in the morning by his primary  cardiologist.  He will be placed on insulin sliding scale.  TSH and  urine drug screen will be checked.      Unice Cobble, MD  Electronically Signed     ACJ/MEDQ  D:  01/23/2009  T:  01/23/2009  Job:  [id number]

## 2011-02-28 NOTE — Discharge Summary (Signed)
[name], [name] NO.:  [id number]   MEDICAL RECORD NO.:  [id number]          PATIENT TYPE:  INP   LOCATION:  2012                         FACILITY:  [facility]   PHYSICIAN:  Elmore Guise., M.D.DATE OF BIRTH:  Mar 05, 1971   DATE OF ADMISSION:  05/06/2007  DATE OF DISCHARGE:  05/08/2007                               DISCHARGE SUMMARY   DISCHARGE DIAGNOSES:  1. Atrial fibrillation.  2. Diabetes mellitus.  3. Hypertension.  4. History of obstructive sleep apnea.  5. History of dyslipidemia.   HISTORY OF PRESENT ILLNESS:  [name] is a 40 year old male who  presented with palpitations, fatigue and dyspnea.  He was found to be in  rapid atrial fibrillation.  He was admitted for treatment.   HOSPITAL COURSE:  The patient's hospital course was uncomplicated.  He  was admitted to telemetry monitoring.  On arrival, he was started on  Cardizem drip with rate control.  This was maintained for 18 hours.  He  continued to have atrial fibrillation.  He was given a amiodarone bolus  with resolution and conversion to normal sinus rhythm.  He continued on  the drip for another 24 hours.  He had no recurrence of his atrial  arrhythmia.  He is now been up and active, without any significant  problems.  His blood sugars have been running anywhere from 180 to 270.  He was treated with Glucophage and sliding scale.  On day of discharge,  he did report that he had been on Glucotrol in the past.  This will be  continued as an outpatient medicine.   DISCHARGE MEDICATIONS:  Include:  1. Glucophage 1000 mg two times daily.  2. Glucotrol XL 10 mg one time daily.  3. Diovan 80 mg one time daily.  4. Cardizem CD 240 mg one time daily.  5. Aspirin 325 mg daily.  6. Lipitor as before. (The patient unsure of home dose).   His blood work on discharge showed old hemoglobin of 16.0, white count  of 10.7, a platelet count of 331.  He had a BUN and creatinine of 80 and  0.6, with a  potassium level of 3.9.  His cardiac markers were negative.  His D-dimer was negative.  Alcohol level was normal.  Hemoglobin A1c was  extremely high at 10.6.  His echo showed an EF of 55% to 60% with mild  LVH.  No significant valvular heart disease was noted.   DISCHARGE INSTRUCTIONS:  His discharge instructions include increase his  activity slowly.  He is to continue his diabetic diet, and we did  discuss the importance and what this includes.  He was advised to not do  any smoking, drinking or illegal drugs such as marijuana or cocaine.  He  is to give me a call, if he has any further problems.  Our  office will contact him to set up an appointment in 1 week.  The patient  would like to also set up a primary care appointment with Dr. Nehemiah Settle  with [facility] I did go over the importance of keeping good  follow-up appointments with his primary care physician to improve his  diabetic control.      Elmore Guise., [name]  Electronically Signed     TWK/MEDQ  D:  05/08/2007  T:  05/08/2007  Job:  [id number]

## 2011-02-28 NOTE — Discharge Summary (Signed)
NAMEADIN, LAKER NO.:  [id number]   MEDICAL RECORD NO.:  [id number]          PATIENT TYPE:  INP   LOCATION:  3741                         FACILITY:  [facility]   PHYSICIAN:  Elmore Guise., M.D.DATE OF BIRTH:  Oct 08, 1971   DATE OF ADMISSION:  03/10/2008  DATE OF DISCHARGE:  03/12/2008                               DISCHARGE SUMMARY   DISCHARGE DIAGNOSES:  1. Paroxysmal atrial fibrillation.  2. Hypertension.  3. Diabetes mellitus.  4. Morbid obesity.  5. Obstructive sleep apnea.   HISTORY OF PRESENT ILLNESS:  Mr. Steib is a 40 year old male who was  admitted with recurrence of atrial fibrillation with rapid ventricular  response.   HOSPITAL COURSE:  The patient's hospital course was uncomplicated.  He  was admitted and after achieving rate control with Cardizem drip, he  spontaneously converted to normal sinus rhythm.  However, after long  questioning him on his symptoms, he reports that he has episodes of this  at least two to three times per month.  Because of this, he was started  on Rythmol 225 mg twice daily.  After starting his Rythmol, he did  report some GI intolerance and this was back down to once daily.  He has  now been in normal sinus rhythm for the last 24 hours.  He has been up  and ambulatory without any significant problems.  He has had good blood  pressure and heart rate control.  He will be discharged home today to  continue the following medications.  1. Metformin 500 mg twice daily.  2. Divan HCT 160/12.5 mg daily.  3. Glipizide 10 mg twice daily.  4. Diltiazem 300 mg daily (increased from 240 mg daily).  5. Aspirin 325 mg daily.  6. Rythmol 225 mg daily.  He is to stop his Rythmol if his GI      intolerance continues.   On discharge, his QT was normal at 400 milliseconds.  All his questions  were answered.  He does need follow-up as well as an EKG in the office  in 2 weeks.  He is to call the office for an appointment.      Elmore Guise., [name]  Electronically Signed     TWK/MEDQ  D:  03/13/2008  T:  03/14/2008  Job:  [id number]

## 2011-02-28 NOTE — Discharge Summary (Signed)
NAMETREVIAN, HAYASHIDA NO.:  [id number]   MEDICAL RECORD NO.:  [id number]          PATIENT TYPE:  INP   LOCATION:  2039                         FACILITY:  [facility]   PHYSICIAN:  Elmore Guise., M.D.DATE OF BIRTH:  Nov 25, 1970   DATE OF ADMISSION:  01/23/2009  DATE OF DISCHARGE:  01/26/2009                               DISCHARGE SUMMARY   DISCHARGE DIAGNOSIS:  1. Paroxysmal atrial fibrillation.  2. Diabetes mellitus.  3. Morbid obesity.  4. Sleep apnea.  5. Substance abuse.   HISTORY OF PRESENT ILLNESS:  Mr. Marcott is a 40 year old African American  male with past medical history of paroxysmal atrial fibrillation who  presented with rapid atrial fibrillation and fatigue.  He was admitted  for further evaluation and treatment.   HOSPITAL COURSE:  The patient's hospital course was uncomplicated.  He  was rate controlled with Cardizem drip.  This was converted to oral  Cardizem.  We increased his Rythmol to 225 mg twice daily.  He converted  to normal sinus rhythm approximately 36 hours into his hospital course.  His urinary drug screen was positive for cocaine.  He reports two  separate episodes of use in the prior week.  He had stopped his Cardizem  and Rythmol.  His recurrent atrial fibrillation was felt secondary to  his cocaine use and stopping his medications.  His initial troponin was  mildly elevated at 0.8 range.  However, all subsequent troponins were  negative.  His renal function and liver function are normal.  He has now  been in normal sinus rhythm for approximately 18 hours.  He will be  discharged home today.   His discharge medications include:  1. Rythmol 225 mg 2 times daily for the next 7 days then he can      decrease it down to 1 time daily.  2. Cardizem CD 360 mg daily (increased dose, prior dosage was 240 mg      daily).  3. Metformin 1000 mg twice daily.  4. Lipitor 20 mg daily.  5. Aspirin 325 mg daily.  6. Divan 80 mg daily.  7.  Glipizide 10 mg daily.   I discussed that he can increase his activity slowly.  He may return to  his normal work activities on April 19 (next Monday).  He will watch his  diet, low sugar, and diabetic diet.  I strongly encouraged him to stay  away from all illegal substances.  He is to call the office if he has  any further problems.  Otherwise, I would like to see him in the office  for followup visit in 2 weeks.      Elmore Guise., [name]  Electronically Signed     TWK/MEDQ  D:  01/26/2009  T:  01/27/2009  Job:  [id number]

## 2011-02-28 NOTE — H&P (Signed)
[name], Douglas NO.:  [id number]   MEDICAL RECORD NO.:  [id number]          PATIENT TYPE:  INP   LOCATION:  3735                         FACILITY:  [facility]   PHYSICIAN:  [name], [name]  DATE OF BIRTH:  [birth year]   DATE OF ADMISSION:  03/10/2008  DATE OF DISCHARGE:                              HISTORY & PHYSICAL   REASON FOR ADMISSION:  Atrial fibrillation.   HISTORY OF PRESENT ILLNESS:  [name] is a 40 year old obese man with a  history of atrial fibrillation, who today had a spontaneous onset of  tachy palpitation, lightheadedness, diaphoresis, and shortness of  breath.  This persisted approximately 20-30 minutes and worsened with  any effort at activities.  He took an extra diltiazem ER as he has been  told in the past.  This did not help much.  He is finally brought to the  emergency room by EMS.  No specific treatment that I can determine has  been administered here other than IV and O2.  He denies any specific  chest tightness with this.   He has a history of atrial fibrillation and was hospitalized here in  July 2008 for same.  Treated with IV diltiazem and then finally IV  amiodarone prior to conversion.  He did have a 2D echocardiogram at that  time that showed EF of 50%-60% with mild LVH and no significant valvular  disease.  I cannot determine that he has had a stress test for a  possible CAD.   PAST MEDICAL HISTORY:  1. Obesity.  2. Diabetes mellitus, oral agent.  3. Hypertension.  4. Obstructive sleep apnea, on CPAP.  5. Hyperlipidemia.   ALLERGIES:  None known.   CURRENT MEDICATIONS:  1. Metformin 1000 mg p.o. b.i.d.  2. Lipitor 20 mg p.o. daily.  3. Diovan 80 mg p.o. daily.  4. Glipizide 10 mg p.o. daily.  5. Diltiazem ER 240 mg p.o. daily.   SOCIAL HISTORY:  This gentleman works for [employer].  It can be a physically demanding job.  Does not use any alcohol.  He is  a former smoker.  Denies any drug  abuse.   REVIEW OF SYSTEMS:  Negative except as mentioned above.   PHYSICAL EXAMINATION:  VITAL SIGNS:  Blood pressure 112/73, pulse is 112  to 96 in atrial fibrillation, respiratory rate is 18, temperature is  97.1, and O2 saturation on room air is 94%.  GENERAL:  This is an alert, cooperative, obese, 40 year old [place]  American male, in no distress.  HEENT:  Unremarkable.  CHEST:  Clear with adequate breath sounds bilaterally.  NECK:  Supple without thyromegaly or masses.  The carotid upstrokes are  normal.  There is no bruit.  HEART:  His heart has an irregular rhythm.  Heart sounds are somewhat  muffled.  No murmur.  ABDOMEN:  Obese, soft, and nontender.  Positive bowel sounds.  External  genitalia is normal, normal male phallus, descended testicles.  No  lesions.  RECTAL:  Not performed.  EXTREMITIES:  Show full range of motion.  No edema.  Intact distal  pulses.  NEUROLOGIC:  Examination is nonfocal.  SKIN:  Warm, dry, and clear.   Electrocardiogram shows atrial fibrillation, septal Q-waves in voltage  for LVH.  Chest x-ray is currently pending.  Glucose is 161, white blood  cell count 12.6, otherwise admission laboratory negative, including  initial point of care and cardiac enzymes in the emergency room.   IMPRESSION:  1. Recurrent atrial fibrillation with rapid ventricular response,      markedly symptomatic.  2. Sleep apnea.  The patient states he uses his continuous positive      airway pressure regularly.  3. Hypertension.  4. Diabetes mellitus.  5. Multiple risk factors for coronary artery disease.   PLAN:  The patient is admitted for rate control with IV diltiazem.  We  will repeat CK-MB and troponin x2.  We will consider repeat 2D  echocardiogram with Dr. Silva Bandy discussion, and we will initiate  anticoagulation with IV heparin per pharmacy.      [name], [name]  Electronically Signed     JHE/MEDQ  D:  03/10/2008  T:  03/11/2008  Job:   [id number]   cc:   Elmore Guise., [name]  Weber Cooks Clelia Croft, MD

## 2011-03-01 ENCOUNTER — Encounter: Payer: Self-pay | Admitting: Family Medicine

## 2011-03-01 ENCOUNTER — Ambulatory Visit (INDEPENDENT_AMBULATORY_CARE_PROVIDER_SITE_OTHER): Payer: PRIVATE HEALTH INSURANCE | Admitting: Family Medicine

## 2011-03-01 VITALS — BP 118/75 | HR 84 | Ht 69.0 in | Wt 320.0 lb

## 2011-03-01 DIAGNOSIS — M25519 Pain in unspecified shoulder: Secondary | ICD-10-CM

## 2011-03-01 DIAGNOSIS — M25511 Pain in right shoulder: Secondary | ICD-10-CM

## 2011-03-01 NOTE — Progress Notes (Signed)
  Subjective:    Patient ID: [name], male    DOB: 09/24/1971, 40 y.o.   MRN: [id number]  HPI   Pt presents to clinic for f/u of WC injury that occurred April 11 to rt shoulder- he feels that he has not had any improvement since last visit, but at that time was 50% improved from the initial injury.  States he has rt shoulder pain after using his arm with any type of movement.  Wakes up at night due to shoulder pain- then has trouble going back to sleep.  Stopped taking ibuprofen 800 mg because he did not find this helpful.  Has been to 1 PT session since last visit, and has another scheduled for tomorrow.  Abiding by work restrictions of no lifting more than 10 lbs. Still denies popping or clicking. Pain mostly in posterior shoulder, but says it "jumps around." Occasionally seems to be into anterior joint line.  Review of Systems Denies F/C/S     Objective:   Physical Exam Gen: obese M NAD Neck: supple Rt shoulder:ROM shows full flexion, abduction, and ER. Appears to have IR deficit of 20-25 deg compared to Lt shoulder.  Neg empty can.  +pain and 4/5 weakness on ER.  No ttp over AC joint. Mildly + Hawkin's, neg Neer's. + O'brien's. NO ttp over BT, neg Speed's and yergasson's. MIld ttp over posterior shoulder.  MSK [place] Rt shoulder: Performed by myself and repeated by Dr. Roanna Epley. Body habitus again makes overall exam very difficult. BT normal.  Subscap with some possible calcification without obvious tearing.  SSP appears normal, unable to discern if acromial impingement occurs.  ISP normal.       Assessment & Plan:  Right shoulder pain, persistent after work related injury dated 01/25/11 - At this point, more concern for a capsular or labral injury.  In addition, MSK [place] is fairly unreliable for diagnosis of RC pathology given his habitus. - Will contact Kathrine Haddock at [facility] regarding order for MR arthrogram vs referral to shoulder surgeon. -  Appeared weaker and  in more pain on exam today, so will change restrictions to 2 lb lift, pull, push max. Continue no [place] activities and no driving. - F/u with [facility] prn

## 2011-03-01 NOTE — Progress Notes (Signed)
  Subjective:    Patient ID: [name], male    DOB: 03/09/1971, 40 y.o.   MRN: [id number]  HPI  Pt presents to clinic for f/u of WC injury that occurred April 11 to rt shoulder- he feels that he has not had any improvement since last visit, but at that time was 50% improved from the initial injury.  States he has rt shoulder pain after using his arm with any type of movement.  Wakes up at night due to shoulder pain- then has trouble going back to sleep.  Stopped taking ibuprofen 800 mg because he did not find this helpful.  Has been to 1 PT session since last visit, and has another scheduled for tomorrow.  Abiding by work restrictions of no lifting more than 10 lbs.   Review of Systems     Objective:   Physical Exam        Assessment & Plan:

## 2011-03-03 NOTE — Letter (Signed)
May 01, 2006     [name]  [address]  [place], [place]  [address]    RE:  [name], [name]  MRN:  [id number]  /  DOB:  [birth year]   Dear [name]:   This letter at your request is to confirm that you have been diagnosed with  obstructive sleep apnea and possible narcolepsy with excessive daytime  sleepiness as a part of your medical condition. Your original diagnostic  studies were in 1996. I last saw you in March 2004 when you were being  treated with CPAP. I have no information on your current status, but would  invite you to make an appointment to reestablish so that we can maintain  support for this medical problem as needed.    Sincerely,      Clinton D. Maple Hudson, MD, FCCP, FACP   CDY/MedQ  DD:  05/01/2006  DT:  05/02/2006  Job #:  [id number]

## 2011-03-03 NOTE — H&P (Signed)
NAMEEANN, CLELAND NO.:  [id number]   MEDICAL RECORD NO.:  [id number]          PATIENT TYPE:  EMS   LOCATION:  MAJO                         FACILITY:  [facility]   PHYSICIAN:  Michaelyn Barter, [name] DATE OF BIRTH:  1971/02/27   DATE OF ADMISSION:  06/26/2005  DATE OF DISCHARGE:                                HISTORY & PHYSICAL   PATIENT'S PRIMARY CARE PHYSICIAN:  Unassigned.   CHIEF COMPLAINT:  Chest pain, shortness of breath.   HISTORY OF PRESENT ILLNESS:  Mr. Dashner is a 40 year old gentleman with a  past medical history of diabetes mellitus, hypertension, who states that  Saturday he developed a productive cough.  It was productive of a greenish  colored phlegm.  He developed some chest discomfort which initially was a  burning sensation accompanied by chills.  Since today, he has developed  fevers, no nausea or emesis.  He has had some diarrhea.  No sick contacts at  home.  He also states that last night he had an episode of sweats.  He has  generalized chest and abdominal pain that occurs primarily when he coughs.  He also complains of some shortness of breath today.   PAST MEDICAL HISTORY:  1.  Diabetes.  2.  Hypertension.  3 Morbid obesity.  1.  Narcolepsy.   PAST SURGICAL HISTORY:  1.  Tonsillectomy.  2.  Nose operation.   ALLERGIES:  NONE.   HOME MEDICATIONS:  The patient does not know his medications.   SOCIAL HISTORY:  Cigarettes:  Two packs per day.  The patient started  smoking at the age of 54.  Alcohol:  The patient drinks one 40 ounce of beer  every two days.   FAMILY HISTORY:  Mother has diabetes.  Father had heart disease and had a  heart attack.   REVIEW OF SYSTEMS:  As per HPI.  Otherwise all other systems are negative.   GENERAL:  The patient is a morbidly obese gentleman who is currently awake,  cooperative, and does not appear to be in any obvious distress.  He has lots  of nasal congestion evident.  VITALS:  Temperature was  initially 100.2 on initial presentation but a  couple of hours later, it rose to102.9, heart rate 88, respirations 16, O2  92%, blood pressure 152/52.  HEENT:  Normocephalic.  Extraocular movements are intact.  Decreased pupil  reactivity to light.  Oral mucosa is pink.  No exudates are present.  NECK:  Thick.  No lymphadenopathy.  Thyroid not palpable.  CARDIAC:     S1, S2 is present.  Regular rate and rhythm.  RESPIRATORY:  Occasional coarse rhonchi bilaterally.  No crackles but a few  wheezes are present.  ABDOMEN:  Obese, soft, nontender, nondistended.  Positive bowel sounds x4  quadrants.  No organomegaly appreciated.  EXTREMITIES:  No edema.  NEUROLOGIC:       The patient is alert and oriented x3.  Cranial nerves II-  XII are intact.  MUSCULOSKELETAL:  5/5 upper and lower extremity strength.   Chest x-ray:  No active cardiopulmonary disease.  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  ABG:  pH 7.388, PCO2 48.7, PO2 48.0, O2 saturation is 78.0.   ASSESSMENT/PLAN:  Problem 1. Respiratory tract/bronchitis.  Will start  empiric IV antibiotics with ceftriaxone 1 g IV every 24 hours and  azithromycin 500 mg IV every 24 hours.  Will send a sputum for gram stain,  culture and sensitivity along with AFB legionella, and fungi.  Will also  collect blood cultures x2.  Because of the gentleman's age, we will order an  HIV test and will provide oxygen via nasal cannula to keep the O2  saturations greater than 93%.  In addition to that, we will provide  nebulized breathing treatments consisting of Atrovent and albuterol.   Problem 2. Diabetes mellitus.  Will perform Accu-Checks and start aspirin  therapy.  In addition, we will also check a fasting lipid profile.   Problem 3. History of hypertension.  Will monitor for now and will consider  starting antihypertensive medication later.   Problem 4. GI prophylaxis.  Will provide Protonix.   Problem 5. DVT prophylaxis.  Will provide Lovenox.      Michaelyn Barter, [name]  Electronically Signed     OR/MEDQ  D:  06/27/2005  T:  06/27/2005  Job:  [id number]

## 2011-03-03 NOTE — Discharge Summary (Signed)
NAMEDEVAUGHN, [name] NO.:  [id number]   MEDICAL RECORD NO.:  [id number]          PATIENT TYPE:  INP   LOCATION:  5706                         FACILITY:  [facility]   PHYSICIAN:  Lonia Blood, [name]       DATE OF BIRTH:  10-17-1970   DATE OF ADMISSION:  06/26/2005  DATE OF DISCHARGE:  06/29/2005                                 DISCHARGE SUMMARY   PRIMARY CARE PHYSICIAN:  Patient was unassigned at the time of admission.   DISCHARGE MEDICATIONS:  1.  Doxycycline 100 mg by mouth daily for five days.  2.  Metformin 500 mg by mouth twice a day.  3.  Lisinopril 10 mg by mouth daily.  4.  Lipitor 10 mg by mouth daily.  5.  Combivent one puff four times a day.  6.  Nicotrol inhaler six cartridges per day.  7.  Aspirin 81 mg daily.  8.  Patient is wearing CPAP for obstructive sleep apnea at home.   DISCHARGE DIAGNOSES:  1.  Acute bronchitis with mild chronic obstructive pulmonary disease      exacerbation.  2.  Diabetes mellitus.  3.  Hypertension.  4.  Obesity.  5.  Obstructive sleep apnea.  6.  Tobacco abuse.  7.  Hyperlipidemia.  8.  Status post tonsillectomy.   CONDITION ON DISCHARGE:  Patient was discharged in good condition.  At the  time of discharge he was able to ambulate without major symptoms.  He was  having good oxygen saturation on room air.  He was instructed to follow up  with Dr. Renaye Rakers, his new primary care physician, on July 03, 2005,  at 3 p.m.  He was instructed not to smoke any cigarettes and use his  nicotine inhalers.   PROCEDURE:  No procedures were done during this admission.   CONSULTATIONS:  No consultations were obtained.   BRIEF ADMISSION HISTORY AND PHYSICAL:  [name] is a 40 year old gentleman  with a history of diabetes, hypertension, obesity who comes into the  hospital for increasing shortness of breath and chest pain.  On admission,  he was found to be sort of hypoxic with a pO2 of 48 on room air and oxygen  saturation 78%.   PHYSICAL EXAMINATION ON ADMISSION:  Occasional coarse rhonchi bilaterally  but no crackles on the lung exam.   HOSPITAL COURSE:  PROBLEM #1 -  BRONCHITIS WITH MILD CHRONIC OBSTRUCTIVE  PULMONARY DISEASE EXACERBATION:  Patient was admitted on the regular floor.  He was started on intravenous antibiotics and nebulizers.  By hospital day  #2, he was already afebrile with good oxygen saturation.  He was switched to  oral antibiotics while in the hospital and he made a very swift recovery.  He never required any intravenous steroids.  At the time of discharge, he  was saturating up to 94% on room air.  He was able to ambulate the floor  without any significant symptoms.  He was instructed to complete a five  course of oral antibiotics at home and use a Combivent inhaler.  He was also  instructed to quit  smoking and also Nicotrol inhalers were provided to  help  him with that process.   PROBLEM #2 -  OBSTRUCTIVE SLEEP APNEA:  Patient is moderately to  noncompliant with his CPAP at home.  He was instructed to wear it as much as  possible in an effort to prevent pulmonary hypertension.   PROBLEM #3 -  DIABETES MELLITUS:  While in the hospital, patient did not  have a hemoglobin A1c checked, but his CBGs were closely monitored and he  was kept on metformin and sliding scale insulin.  His CBGs were within  acceptable levels.  At the time of discharge, he was instructed to use  metformin twice a day.   PROBLEM #4 -  HYPERLIPIDEMIA:  Fasting lipid panel was obtained while in the  hospital.  Patient's total cholesterol was 152, triglycerides 209, LDL of  82.  Patient was continued on his home dose of Lipitor at 100 mg every night  and he will follow up with his new primary care physician.   PROBLEM #5 - HYPERTENSION:  While in the hospital, patient's blood pressures  were under good control on Lisinopril 10 mg daily and this was continued at  the time of discharge.            ______________________________  Lonia Blood, [name]     SL/MEDQ  D:  06/29/2005  T:  06/29/2005  Job:  [id number]   cc:   Renaye Rakers, [name]  [address], [address]  [place]  [place] [address]  Fax: [phone]

## 2011-03-06 ENCOUNTER — Other Ambulatory Visit: Payer: Self-pay | Admitting: Family Medicine

## 2011-03-06 DIAGNOSIS — M25511 Pain in right shoulder: Secondary | ICD-10-CM

## 2011-03-06 NOTE — Progress Notes (Signed)
Work comp injury with persistent Rt shoulder pain and + O'brien's test.  Concern for capsule or labral injury, MR arthrogram to rule out.  If normal arthrogram, plan for continued therapy and increase wt on work restrictions.

## 2011-03-06 NOTE — Progress Notes (Signed)
Fax MR Arthogram order to [facility] who handles all imaging referrals for the city of [place]. Faxed to [phone].

## 2011-03-17 ENCOUNTER — Other Ambulatory Visit: Payer: Self-pay | Admitting: Family Medicine

## 2011-03-17 NOTE — Progress Notes (Signed)
Called patient and discussed his MRI results of Rt shoulder, which unfortunately he did not get arthrogram with for some reason, although this is what we ordered.  These showed full-thickness distal supraspinatus insertional tendon tear that appears to extend into the distal infraspinatus. Also showed significant tendinosis of the mid and proximal portions of the supraspinatus and infraspinatus. Also showed moderate to advanced a.c. joint arthrosis that narrows the subacromial outlet. Also is a globular deformity of the anterior inferior labrum likely degenerative change. I told the patient he would need to see a shoulder surgeon, at this point he has not seen a lot of benefit from physical therapy, so I think he can hold off on that for now.  I discussed his case over the phone with Jackquline Bosch, who is a shoulder specialist at [facility] orth[facility]ics. Despite my request for an arthrogram, this did not happen, however given the results, Dr. Ave Filter felt that even without the arthrogram contrast, there was still enough evidence for which surgical intervention would be indicated, and if there is more labral pathology then it can still be addressed via arthroscopy. Thus, fortunately I think the lack of glenohumeral contrast did not affect the situation, although was unfortunately improperly ordered.  I will have Lillia Pauls, our medical assistant contact the Worker's Compensation group to discuss which shoulder surgeon they would like for him to see.

## 2011-03-21 NOTE — Patient Instructions (Signed)
Appt with Dr. Mayo Ao June 11th 9am Arrive at 8:45 for registration [address] [place], [place].  [phone]

## 2011-03-28 ENCOUNTER — Encounter: Payer: Self-pay | Admitting: Family Medicine

## 2011-05-18 ENCOUNTER — Other Ambulatory Visit: Payer: Self-pay | Admitting: Orthopedic Surgery

## 2011-05-18 ENCOUNTER — Encounter (HOSPITAL_COMMUNITY): Payer: Worker's Compensation

## 2011-05-18 LAB — CBC
HCT: 39.8 % (ref 39.0–52.0)
MCH: 27.2 pg (ref 26.0–34.0)
MCHC: 33.4 g/dL (ref 30.0–36.0)
MCV: 81.4 fL (ref 78.0–100.0)
Platelets: 261 10*3/uL (ref 150–400)
RDW: 13.3 % (ref 11.5–15.5)

## 2011-05-18 LAB — BASIC METABOLIC PANEL
BUN: 8 mg/dL (ref 6–23)
Calcium: 9.2 mg/dL (ref 8.4–10.5)
Creatinine, Ser: 0.74 mg/dL (ref 0.50–1.35)
GFR calc Af Amer: >60 mL/min (ref >60)

## 2011-05-18 LAB — SURGICAL PCR SCREEN: MRSA, PCR: NEGATIVE

## 2011-05-25 ENCOUNTER — Observation Stay (HOSPITAL_COMMUNITY)
Admission: RE | Admit: 2011-05-25 | Discharge: 2011-05-26 | Disposition: A | Discharge status: Other Acute Inpatient Hospital | Payer: Worker's Compensation | Source: Ambulatory Visit | Attending: Orthopedic Surgery | Admitting: Orthopedic Surgery

## 2011-05-25 DIAGNOSIS — Z01812 Encounter for preprocedural laboratory examination: Secondary | ICD-10-CM | POA: Insufficient documentation

## 2011-05-25 DIAGNOSIS — G4733 Obstructive sleep apnea (adult) (pediatric): Secondary | ICD-10-CM | POA: Insufficient documentation

## 2011-05-25 DIAGNOSIS — M19019 Primary osteoarthritis, unspecified shoulder: Secondary | ICD-10-CM | POA: Insufficient documentation

## 2011-05-25 DIAGNOSIS — X58XXXA Exposure to other specified factors, initial encounter: Secondary | ICD-10-CM | POA: Insufficient documentation

## 2011-05-25 DIAGNOSIS — E119 Type 2 diabetes mellitus without complications: Secondary | ICD-10-CM | POA: Insufficient documentation

## 2011-05-25 DIAGNOSIS — Z79899 Other long term (current) drug therapy: Secondary | ICD-10-CM | POA: Insufficient documentation

## 2011-05-25 DIAGNOSIS — M25819 Other specified joint disorders, unspecified shoulder: Secondary | ICD-10-CM | POA: Insufficient documentation

## 2011-05-25 DIAGNOSIS — I4891 Unspecified atrial fibrillation: Secondary | ICD-10-CM | POA: Insufficient documentation

## 2011-05-25 DIAGNOSIS — S43429A Sprain of unspecified rotator cuff capsule, initial encounter: Principal | ICD-10-CM | POA: Insufficient documentation

## 2011-05-25 DIAGNOSIS — I10 Essential (primary) hypertension: Secondary | ICD-10-CM | POA: Insufficient documentation

## 2011-05-25 LAB — RAPID URINE DRUG SCREEN, HOSP PERFORMED
Amphetamines: NOT DETECTED
Barbiturates: NOT DETECTED
Opiates: NOT DETECTED
Tetrahydrocannabinol: NOT DETECTED

## 2011-05-25 LAB — GLUCOSE, CAPILLARY: Glucose-Capillary: 206 mg/dL — ABNORMAL HIGH (ref 70–99)

## 2011-05-26 LAB — GLUCOSE, CAPILLARY: Glucose-Capillary: 180 mg/dL — ABNORMAL HIGH (ref 70–99)

## 2011-06-01 NOTE — Op Note (Signed)
[name], [name] NO.:  [id number]  MEDICAL RECORD NO.:  [id number]  LOCATION:  1615                         FACILITY:  [facility]  PHYSICIAN:  Jones Broom, MD    DATE OF BIRTH:  07/04/1971  DATE OF PROCEDURE:  05/25/2011 DATE OF DISCHARGE:                              OPERATIVE REPORT   PREOPERATIVE DIAGNOSES: 1. Right shoulder rotator cuff tear. 2. Right shoulder impingement. 3. Right shoulder acromioclavicular joint degenerative disease.  POSTOPERATIVE DIAGNOSES: 1. Right shoulder high-grade partial bursal surface rotator cuff tear. 2. Right shoulder impingement. 3. Right shoulder acromioclavicular joint degenerative disease. 4. Right shoulder type 1 superior labral anterior to posterior tear.  PROCEDURE PERFORMED: 1. Arthroscopic right shoulder rotator cuff repair of high-grade     bursal surface tear. 2. Right shoulder arthroscopic subacromial decompression. 3. Right shoulder arthroscopic distal clavicle excision. 4. Right shoulder arthroscopic debridement of type 1 superior labral     tear.  ATTENDING SURGEON:  Berline Lopes, MD  ASSISTANT:  None.  ANESTHESIA:  GETA.  COMPLICATIONS:  None.  DRAINS:  None.  SPECIMENS:  None.  ESTIMATED BLOOD LOSS:  Minimal.  INDICATIONS FOR SURGERY:  [name] is a 40 year old gentleman who was injured at work on January 25, 2011.  He went onto have significant pain in the shoulder and an MRI which demonstrated an apparent small full-thickness tear of the anterior supraspinatus.  He had one injection in the shoulder, which helped for short time and had some physical therapy without much improvement.  After a long discussion of small rotator cuff tears, he elected to go forward with surgical repair to try and prevent tear progression and decrease his pain and increased function.  He understood risks, benefits, and alternatives to surgery including but not limited to risk of bleeding, infection,  damage to neurovascular structures, risk of nonhealing, stiffness and potential for repeat surgery.  He also had a type 3 acromion signs and chronic impingement and symptomatic AC joint disease and was indicated for arthroscopic subacromial decompression and distal clavicle excision.  OPERATIVE FINDINGS:  Examination under anesthesia demonstrated no stiffness or instability.  Diagnostic arthroscopy revealed a type 1 superior labral tear with the superior labrum falling into the joint. The biceps root itself was firmly attached.  The biceps tendon had no significant tearing at proximal portion of long head.  The glenohumeral joint surfaces were pristine.  No loose bodies were noted in the joint. Posterior rotator cuff intact.  Superior rotator cuff on the articular surface had some mild fraying, which was debrided, but no full-thickness tearing.  The subacromial space was noted have significant hypertrophic bursitis and fraying of the coracoacromial ligament indicating chronic impingement.  Careful examination of the bursal rotator cuff did demonstrate a significant bursal-sided tear, which was probably 90% of the tendon thickness just posterior to the biceps tendon.  There was exposed tuberosity.  This was prepared and repaired with one 5.5 mm BioComposite corkscrew anchor back down to the prepared bleeding tuberosity.  He was noted to have type 3 acromion, which was taken down with standard acromioplasty and significant AC joint disease and standard distal clavicle excision was performed resecting approximately 7 to 8  mm of bone in a smooth even fashion.  PROCEDURE:  The patient was identified in preoperative holding area where I personally marked the operative site after verifying site, side, and procedure with the patient.  He was taken back to the operating room where general anesthesia was induced without complication.  He was placed in a beach-chair position with all extremities  carefully padded in position.  The right upper extremity was prepped, draped in standard sterile fashion.  The patient did receive IV antibiotics prior to procedure.  Appropriate time-out procedure was carried out.  The arthroscope was introduced through a standard posterior portal and standard anterior portal was then established with needle localization just above the subscapularis.  The probe was introduced into the anterior portal and diagnostic arthroscopy was carried out with findings as described above.  The shaver was introduced into the glenohumeral joint to debride the superior labral tear, which was noted to be falling into the joint.  After debridement, it was smooth and even and the biceps root was felt to be firmly attached to the superior glenoid.  The biceps tendon itself was pristine.  No loose bodies were noted.  The glenohumeral joint surfaces were intact.  Posterior rotator cuff was intact.  Superiorly, there was mild fraying, which was debrided but there was clearly no full-thickness tear of the rotator cuff all the way to the biceps anteriorly.  The arthroscope was introduced into the subacromial space and lateral portal was established with needle localization.  The shaver was introduced and used to perform extensive bursectomy of the hyperemic bursa.  There was significant fraying of the coracoacromial ligament indicating chronic impingement.  He was noted to have high-grade bursal surface partial tear anteriorly just posterior the biceps, which was noted to be approximately 90% with a few medial strands hanging on.  The shaver and ArthroCare were used to clean out the exposed tuberosity.  The bur was then used to bur down the tuberosity to bleeding bone to promote healing.  One 5.5 mm BioComposite corkscrew anchor was then placed percutaneously just off the lateral border of the acromion at the lateral margin of the greater tuberosity and the tear was then  repaired in a simple fashion with two sutures from the anchor, one anterior and one posterior bringing the tendon nicely down to the prepared tuberosity.  The coracoacromial ligament was then taken down exposing a large type 3 anterior acromion.  The 4-mm bur was used from the lateral portal to perform a standard acromioplasty from lateral to medial creating a smooth undersurface from posterior to anterior as well.  The distal clavicle was then exposed arthroscopically.  There was a small space between the clavicle and acromion and was degenerative appearing.  The bur was used to take off the distal 8 mm of the distal clavicle for starting in the undersurface from lateral portal and continuing from anterior portal and the [facility] joint creating a smooth even resection.  The acromioplasty in this clavicle excision were then viewed from lateral portal and felt to be excellent. The arthroscopic equipment was then removed from the joint and portals were closed using 3-0 nylon in interrupted fashion.  Sterile dressings were applied including Adaptic, 4 x 4s, ABDs, and tape.  The patient was then allowed to waken from general anesthesia placed in a sling, transferred to stretcher, and taken to the recovery room in stable condition.  POSTOPERATIVE PLAN:  He will be kept overnight for observation, given his history of sleep apnea  and his large size.  He will be discharged in the morning as long as he is stable and doing well.  He will follow the cuff tear protocol.     Jones Broom, MD     JC/MEDQ  D:  05/25/2011  T:  05/25/2011  Job:  [id number]  Electronically Signed by Jones Broom  on 06/01/2011 04:34:34 PM

## 2011-07-12 LAB — DIFFERENTIAL
Basophils Relative: 0
Eosinophils Absolute: 0.3
Eosinophils Relative: 3
Lymphs Abs: 2.5
Monocytes Relative: 9
Neutrophils Relative %: 68

## 2011-07-12 LAB — CBC
HCT: 44.7
MCHC: 34.8
MCHC: 35.2
MCV: 83.1
Platelets: 337
RBC: 4.99
RBC: 5.38
RDW: 13.6
WBC: 12.6 — ABNORMAL HIGH

## 2011-07-12 LAB — POCT CARDIAC MARKERS
CKMB, poc: 1.2
Troponin i, poc: <0.05

## 2011-07-12 LAB — COMPREHENSIVE METABOLIC PANEL
ALT: 44
AST: 31
Calcium: 9
GFR calc Af Amer: >60
Sodium: 139
Total Protein: 6.6

## 2011-07-12 LAB — POCT I-STAT, CHEM 8
BUN: 13
Chloride: 105
HCT: 47
Potassium: 3.8

## 2011-07-12 LAB — HEPARIN LEVEL (UNFRACTIONATED): Heparin Unfractionated: 0.29 — ABNORMAL LOW

## 2011-07-12 LAB — CK TOTAL AND CKMB (NOT AT ARMC)
CK, MB: 1.6
Relative Index: 0.8
Total CK: 207

## 2011-07-12 LAB — CARDIAC PANEL(CRET KIN+CKTOT+MB+TROPI)
Relative Index: 1.2
Relative Index: INVALID
Total CK: 94

## 2011-07-12 LAB — TSH: TSH: 1.577

## 2011-07-27 LAB — I-STAT 8, (EC8 V) (CONVERTED LAB)
BUN: 11
Chloride: 103
pCO2, Ven: 41.1 — ABNORMAL LOW
pH, Ven: 7.389 — ABNORMAL HIGH

## 2011-07-27 LAB — POCT I-STAT CREATININE: Creatinine, Ser: 0.7

## 2011-07-31 LAB — URINALYSIS, ROUTINE W REFLEX MICROSCOPIC
Bilirubin Urine: NEGATIVE
Nitrite: NEGATIVE
Protein, ur: NEGATIVE
Specific Gravity, Urine: 1.043 — ABNORMAL HIGH
Urobilinogen, UA: 0.2

## 2011-07-31 LAB — CBC
HCT: 46.5
Hemoglobin: 16
RBC: 5.63
WBC: 10.7 — ABNORMAL HIGH

## 2011-07-31 LAB — I-STAT 8, (EC8 V) (CONVERTED LAB)
BUN: 10
Glucose, Bld: 306 — ABNORMAL HIGH
HCT: 49
Hemoglobin: 16.7
Potassium: 4.1
Sodium: 140
TCO2: 28

## 2011-07-31 LAB — COMPREHENSIVE METABOLIC PANEL
ALT: 59 — ABNORMAL HIGH
AST: 39 — ABNORMAL HIGH
Albumin: 3.8
Alkaline Phosphatase: 69
Chloride: 99
Creatinine, Ser: 0.93
GFR calc Af Amer: >60
Potassium: 3.8
Sodium: 136
Total Bilirubin: 0.7

## 2011-07-31 LAB — APTT: aPTT: 30

## 2011-07-31 LAB — CARDIAC PANEL(CRET KIN+CKTOT+MB+TROPI)
CK, MB: 1.1
CK, MB: 1.2
Relative Index: 0.6
Troponin I: 0.02

## 2011-07-31 LAB — URINE MICROSCOPIC-ADD ON

## 2011-07-31 LAB — BASIC METABOLIC PANEL
BUN: 8
Chloride: 105
GFR calc non Af Amer: >60
Glucose, Bld: 250 — ABNORMAL HIGH
Glucose, Bld: 300 — ABNORMAL HIGH
Potassium: 3.8
Potassium: 3.9
Sodium: 137

## 2011-07-31 LAB — PROTIME-INR: Prothrombin Time: 13.7

## 2011-07-31 LAB — DIFFERENTIAL
Basophils Absolute: 0.1
Eosinophils Relative: 4
Lymphocytes Relative: 28
Monocytes Absolute: 1 — ABNORMAL HIGH

## 2011-07-31 LAB — ETHANOL: Alcohol, Ethyl (B): <5

## 2011-07-31 LAB — POCT CARDIAC MARKERS: Operator id: 146091

## 2011-07-31 LAB — POCT I-STAT CREATININE: Operator id: 146091

## 2011-07-31 LAB — RAPID URINE DRUG SCREEN, HOSP PERFORMED: Barbiturates: NOT DETECTED

## 2011-07-31 LAB — CK TOTAL AND CKMB (NOT AT ARMC): Relative Index: 0.7

## 2011-10-17 ENCOUNTER — Encounter (HOSPITAL_COMMUNITY): Payer: Self-pay | Admitting: *Deleted

## 2011-10-17 ENCOUNTER — Emergency Department (HOSPITAL_COMMUNITY)
Admission: EM | Admit: 2011-10-17 | Discharge: 2011-10-17 | Disposition: A | Discharge status: Home / Self Care | Payer: 59 | Attending: Emergency Medicine | Admitting: Emergency Medicine

## 2011-10-17 DIAGNOSIS — I10 Essential (primary) hypertension: Secondary | ICD-10-CM | POA: Insufficient documentation

## 2011-10-17 DIAGNOSIS — F17200 Nicotine dependence, unspecified, uncomplicated: Secondary | ICD-10-CM | POA: Insufficient documentation

## 2011-10-17 DIAGNOSIS — L0231 Cutaneous abscess of buttock: Secondary | ICD-10-CM | POA: Insufficient documentation

## 2011-10-17 DIAGNOSIS — E119 Type 2 diabetes mellitus without complications: Secondary | ICD-10-CM | POA: Insufficient documentation

## 2011-10-17 DIAGNOSIS — IMO0001 Reserved for inherently not codable concepts without codable children: Secondary | ICD-10-CM | POA: Insufficient documentation

## 2011-10-17 DIAGNOSIS — Z79899 Other long term (current) drug therapy: Secondary | ICD-10-CM | POA: Insufficient documentation

## 2011-10-17 DIAGNOSIS — L03317 Cellulitis of buttock: Secondary | ICD-10-CM | POA: Insufficient documentation

## 2011-10-17 HISTORY — DX: Sleep apnea, unspecified: G47.30

## 2011-10-17 HISTORY — DX: Essential (primary) hypertension: I10

## 2011-10-17 MED ORDER — DOXYCYCLINE HYCLATE 100 MG PO CAPS: 100.0000 mg | ORAL_CAPSULE | Freq: Two times a day (BID) | ORAL | Status: AC

## 2011-10-17 MED ORDER — CEPHALEXIN 500 MG PO CAPS: 500.0000 mg | ORAL_CAPSULE | Freq: Four times a day (QID) | ORAL | Status: AC

## 2011-10-17 MED ORDER — HYDROCODONE-ACETAMINOPHEN 5-325 MG PO TABS: 1.0000 | ORAL_TABLET | ORAL | Status: AC | PRN

## 2011-10-17 NOTE — ED Notes (Signed)
Abscess on rt buttocks since Friday

## 2011-10-17 NOTE — ED Provider Notes (Signed)
History     CSN: [id number]  Arrival date & time 10/17/11  1625   First MD Initiated Contact with Patient 10/17/11 1822      Chief Complaint  Patient presents with  . Abscess    RT buttocks x 4 days   HPI Patient complains of abscess for the past four days. Patient reports pain at that area. Denies any fevers, chills, nausea, vomiting.   Past Medical History  Diagnosis Date  . Hypertension   . Diabetes mellitus   . Sleep apnea     Past Surgical History  Procedure Date  . Appendectomy   . Joint replacement     No family history on file.  History  Substance Use Topics  . Smoking status: Current Everyday Smoker  . Smokeless tobacco: Current User    Types: Snuff  . Alcohol Use: Yes      Review of Systems  Constitutional: Negative for fever, chills, diaphoresis and appetite change.  HENT: Negative for neck pain.   Eyes: Negative for photophobia and visual disturbance.  Respiratory: Negative for cough, chest tightness and shortness of breath.   Cardiovascular: Negative for chest pain.  Gastrointestinal: Negative for nausea, vomiting and abdominal pain.  Genitourinary: Negative for flank pain.  Musculoskeletal: Negative for back pain.  Skin: Positive for wound. Negative for rash.  Neurological: Negative for weakness and numbness.  All other systems reviewed and are negative.    Allergies  Review of patient's allergies indicates no known allergies.  Home Medications   Current Outpatient Rx  Name Route Sig Dispense Refill  . DILTIAZEM HCL ER COATED BEADS 360 MG PO CP24 Oral Take 360 mg by mouth daily.    Marland Kitchen GLIPIZIDE ER 10 MG PO TB24 Oral Take 10 mg by mouth Twice daily.    Marland Kitchen METFORMIN HCL 500 MG PO TABS Oral Take 500 mg by mouth 2 (two) times daily.        BP 176/111  Pulse 101  Temp(Src) 98.1 F (36.7 C) (Oral)  Resp 18  SpO2 97%  Physical Exam  Nursing note and vitals reviewed. Constitutional: He is oriented to person, place, and time. He appears  well-developed and well-nourished. No distress.  HENT:  Head: Normocephalic and atraumatic.  Mouth/Throat: Oropharynx is clear and moist.  Eyes: EOM are normal. Pupils are equal, round, and reactive to light.  Neck: Normal range of motion. Neck supple.  Cardiovascular: Normal rate, regular rhythm, normal heart sounds and intact distal pulses.  Exam reveals no gallop and no friction rub.   No murmur heard. Pulmonary/Chest: Effort normal and breath sounds normal. No respiratory distress. He has no wheezes. He exhibits no tenderness.  Abdominal: Soft. Bowel sounds are normal. There is no tenderness. There is no rebound and no guarding.  Musculoskeletal: Normal range of motion. He exhibits no edema and no tenderness.  Neurological: He is alert and oriented to person, place, and time. No sensory deficit. He displays a negative Romberg sign. Gait normal. GCS eye subscore is 4. GCS verbal subscore is 5. GCS motor subscore is 6. He displays no Babinski's sign on the right side. He displays no Babinski's sign on the left side.  Skin: Skin is warm and dry. No rash noted. He is not diaphoretic. No erythema. No pallor.       Abscess to the right buttocks, near the crease. No rectal pain on DRE. No involvement to the scrotum. Chaperone present.   Psychiatric: He has a normal mood and affect. His behavior  is normal. Judgment and thought content normal.    ED Course  Procedures (including critical care time)  Patient seen and evaluated.  VSS reviewed. . Nursing notes reviewed.  Will monitor the patient closely. They agree with the treatment plan and diagnosis.   INCISION AND DRAINAGE Performed by: Rochel Brome A Consent: Verbal consent obtained. Risks and benefits: risks, benefits and alternatives were discussed Type: abscess  Body area: right buttocks  Anesthesia: local infiltration  Local anesthetic: lidocaine 2% with epinephrine  Anesthetic total: 5 ml  Complexity: complex Blunt  dissection to break up loculations  Drainage: purulent  Drainage amount: extensive  Packing material: 1/4 in iodoform gauze  Patient tolerance: Patient tolerated the procedure well with no immediate complications.  Chaperone present during examination.  MDM  Abscess to right buttocks. No rectal pain no evidence of groin infection. VSS. Wound packed, advised return in 48 hours for wound recheck. Advised of wound care.         [name], [place] 10/17/11 2101

## 2011-10-18 NOTE — ED Provider Notes (Signed)
Medical screening examination/treatment/procedure(s) were performed by non-physician practitioner and as supervising physician I was immediately available for consultation/collaboration.   Leonel Mccollum M Skye Rodarte, MD 10/18/11 0004 

## 2012-01-24 ENCOUNTER — Encounter: Payer: Self-pay | Admitting: Internal Medicine

## 2012-01-25 ENCOUNTER — Ambulatory Visit (INDEPENDENT_AMBULATORY_CARE_PROVIDER_SITE_OTHER): Payer: 59 | Admitting: Internal Medicine

## 2012-01-25 ENCOUNTER — Encounter: Payer: Self-pay | Admitting: *Deleted

## 2012-01-25 ENCOUNTER — Encounter: Payer: Self-pay | Admitting: Internal Medicine

## 2012-01-25 VITALS — BP 130/62 | HR 83 | Ht 69.0 in | Wt 330.0 lb

## 2012-01-25 DIAGNOSIS — J309 Allergic rhinitis, unspecified: Secondary | ICD-10-CM

## 2012-01-25 DIAGNOSIS — G4719 Other hypersomnia: Secondary | ICD-10-CM

## 2012-01-25 DIAGNOSIS — G4733 Obstructive sleep apnea (adult) (pediatric): Secondary | ICD-10-CM

## 2012-01-25 MED ORDER — FLUTICASONE PROPIONATE 50 MCG/ACT NA SUSP: NASAL | Status: DC

## 2012-01-25 NOTE — Progress Notes (Signed)
01/25/12- 57 yoM former smoker comes to reestablish for management of obstructive sleep apnea complicated by history of allergic rhinitis and atrial fibrillation, diabetes, HTN. PCP- Dr Trula Slade NPSG 11/23/00- AHI 20 per hour. He continues using CPAP all night every night. 14 CWP/[employer]. Using a fullface mask but putting it on so only covers his nose,  otherwise it smothers him. Fighting daytime sleepiness which affects his work. Hypnagogic hallucination associated with dreaming. 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 Vivid dreams as soon as he falls asleep. Wakes irritable from naps. Bedtime 9:30 PM, very brief sleep latency, wakes 4 times for nocturia before up at 4:30 AM. No sleeping pill. 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  Quit smoking 2009, 1 beer/ month, little caffeine. Unmarried, living w/ sister, has girlfriend Fam hx- Father hx MI, Mother dcs'd DM  ROS-see HPI Constitutional:   No-   weight loss, night sweats, fevers, chills,  +fatigue, lassitude. HEENT:   No-  headaches, difficulty swallowing, tooth/dental problems, sore throat,       No-  sneezing, itching, ear ache, nasal congestion, post nasal drip,  CV:  No-   chest pain, orthopnea, PND, swelling in lower extremities, anasarca, dizziness, palpitations Resp: No-   shortness of breath with exertion or at rest.              No-   productive cough,  No non-productive cough,  No- coughing up of blood.              No-   change in color of mucus.  No- wheezing.   Skin: No-   rash or lesions. GI:  No-   heartburn, indigestion, abdominal pain, nausea, vomiting,  GU:  MS:  No-   joint pain or swelling.  Neuro-     nothing unusual Psych:  No- change in mood or affect. No depression or anxiety.  No memory loss.  OBJ- Physical Exam General- Alert, Oriented, Affect-appropriate, Distress- none acute, obese, intelligent Skin- rash-none, lesions- none, excoriation- none Lymphadenopathy-  none Head- atraumatic            Eyes- Gross vision intact, PERRLA, conjunctivae and secretions clear            Ears- Hearing, canals-normal            Nose- rhinitis/ turbinate edema, no-Septal dev, mucus, polyps, erosion, perforation             Throat- Mallampati III , mucosa clear , drainage- none, tonsils- atrophic Neck- flexible , trachea midline, no stridor , thyroid nl, carotid no bruit Chest - symmetrical excursion , unlabored           Heart/CV- RRR , no murmur , no gallop  , no rub, nl s1 s2                           - JVD- none , edema- none, stasis changes- none, varices- none           Lung- clear to P&A, wheeze- none, cough- none , dullness-none, rub- none           Chest wall-  Abd-  Br/ Gen/ Rectal- Not done, not indicated Extrem- cyanosis- none, clubbing, none, atrophy- none, strength- nl Neuro- grossly intact to observation

## 2012-01-25 NOTE — Patient Instructions (Signed)
Letter for work- Being evaluated for sleep problems with testing being done. Follow up is scheduled.  Order- Christoper Allegra-  autotitrate CPAP 5-20 cwp x 7 days for pressure recommendation  Script for Flonase/ fluticasone nasal spray - use it every day to help get your allergic nose working better.

## 2012-01-29 ENCOUNTER — Telehealth: Payer: Self-pay | Admitting: Internal Medicine

## 2012-01-29 DIAGNOSIS — G47411 Narcolepsy with cataplexy: Secondary | ICD-10-CM | POA: Insufficient documentation

## 2012-01-29 NOTE — Assessment & Plan Note (Signed)
Moderate obstructive apnea. He has been very compliant with CPAP although he has not been wearing his mask appropriately. He remains seriously overweight. We need to make sure the pressure settings are appropriate and may eventually need to do a formal retest. Plan-auto titrate for pressure check. Change mass to one that fits him better. Educated on use.

## 2012-01-29 NOTE — Assessment & Plan Note (Signed)
Plan-Flonase and/or nasal saline rinse. No history of cocaine use with potential for irritant rhinitis.

## 2012-01-29 NOTE — Assessment & Plan Note (Signed)
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 We need to move cautiously with this because of his history of cocaine use although he insists he no longer does that. Plan-reconsider this issue after we see how he responds to CPAP adjustment. In

## 2012-01-29 NOTE — Telephone Encounter (Signed)
lmomtcb x1 

## 2012-01-30 NOTE — Telephone Encounter (Signed)
Pt reports that he has not heard anything from [place] regarding the auto titration trial. Spoke with Marcelino Duster at [place] and she had tried to contact pt but had no luck.  I gave her pt's new phone number and she is going to call pt now to set up Cpap auto titration.

## 2012-02-05 ENCOUNTER — Telehealth: Payer: Self-pay | Admitting: Internal Medicine

## 2012-02-05 NOTE — Telephone Encounter (Signed)
Pt aware we will not be filling out FMLA forms for his sleep apnea and CPAP use per Philipp Deputy.

## 2012-02-05 NOTE — Telephone Encounter (Signed)
Called and spoke with pt. Pt recently seen by CY on 01/25/12 for OSA.  Pt states he missed work today because he was wearing his cpap machine and overslept.  Pt is wondering if he could apply for FMLA for his OSA in cases like this where he oversleeps and misses work.  CY, please advise.  Thank you!

## 2012-03-05 ENCOUNTER — Telehealth: Payer: Self-pay | Admitting: Internal Medicine

## 2012-03-05 NOTE — Telephone Encounter (Signed)
Spoke with pt. He states that he has been using new CPAP machine since 02-14-12 and wants to know if it is time to turn his chip in to DME. I advised per last ov instructions, this was to be done after using for 7 days. I advised will check with CDY to be sure though. Please advise, thanks!

## 2012-03-05 NOTE — Telephone Encounter (Signed)
LMTCB

## 2012-03-05 NOTE — Telephone Encounter (Signed)
Ok to turn in the chip to his DME company

## 2012-03-06 NOTE — Telephone Encounter (Signed)
Advised patient per CY okay to turn in chip to DME company.  Nothing else needed.  [facility]

## 2012-03-06 NOTE — Telephone Encounter (Signed)
[email or web address]  

## 2012-03-15 ENCOUNTER — Ambulatory Visit: Payer: Self-pay | Admitting: Internal Medicine

## 2012-03-20 ENCOUNTER — Telehealth: Payer: Self-pay | Admitting: Internal Medicine

## 2012-03-20 DIAGNOSIS — G4733 Obstructive sleep apnea (adult) (pediatric): Secondary | ICD-10-CM

## 2012-03-20 NOTE — Telephone Encounter (Signed)
Katie please advise if you received download yet or not thanks

## 2012-03-20 NOTE — Telephone Encounter (Signed)
We do not have any download-we were told pt never took card to the office. Need to find out if he went and then call the DME to get download.

## 2012-03-20 NOTE — Telephone Encounter (Signed)
Katie, I spoke with the pt and he states that he took his card to [place] on 03-18-12.  Please obtain these results and give to CDY for review, thanks!

## 2012-03-22 NOTE — Telephone Encounter (Signed)
[name] is re faxing the download to Triage fax and I will place on CY's cart for review.

## 2012-03-25 NOTE — Telephone Encounter (Signed)
Dr. Maple Hudson, please advise on results on download, thanks

## 2012-03-25 NOTE — Telephone Encounter (Signed)
Download received and placed on CDY cart for review.

## 2012-03-25 NOTE — Telephone Encounter (Signed)
I can't find a recent download. If they sent it on the 7th, then it must be down in medical records waiting to be scanned.

## 2012-03-25 NOTE — Telephone Encounter (Signed)
Order- DME [facility]  Set fixed CPAP at 13 cwp based on recent download.   Dx OSA

## 2012-03-25 NOTE — Telephone Encounter (Signed)
Pt still waiting to hear back re: cpap download. [name]

## 2012-03-27 NOTE — Telephone Encounter (Signed)
Pt aware. [name], CMA  

## 2012-03-27 NOTE — Telephone Encounter (Signed)
Order placed. [email or web address] to advise the pt. Carron Curie, CMA

## 2012-03-29 ENCOUNTER — Encounter: Payer: Self-pay | Admitting: Internal Medicine

## 2012-05-09 ENCOUNTER — Ambulatory Visit (INDEPENDENT_AMBULATORY_CARE_PROVIDER_SITE_OTHER): Payer: 59 | Admitting: Internal Medicine

## 2012-05-09 ENCOUNTER — Encounter: Payer: Self-pay | Admitting: Internal Medicine

## 2012-05-09 VITALS — BP 140/90 | HR 87 | Ht 69.0 in | Wt 327.2 lb

## 2012-05-09 DIAGNOSIS — G4733 Obstructive sleep apnea (adult) (pediatric): Secondary | ICD-10-CM

## 2012-05-09 DIAGNOSIS — G4710 Hypersomnia, unspecified: Secondary | ICD-10-CM

## 2012-05-09 NOTE — Progress Notes (Signed)
01/25/12- 45 yoM former smoker comes to reestablish for management of obstructive sleep apnea complicated by history of allergic rhinitis and atrial fibrillation, diabetes, HTN. PCP- Dr Trula Slade NPSG 11/23/00- AHI 20 per hour. He continues using CPAP all night every night. 14 CWP/[facility]. Using a fullface mask but putting it on so only covers his nose,  otherwise it smothers him. Fighting daytime sleepiness which affects his work. Hypnagogic hallucination associated with dreaming. 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 Vivid dreams as soon as he falls asleep. Wakes irritable from naps. Bedtime 9:30 PM, very brief sleep latency, wakes 4 times for nocturia before up at 4:30 AM. No sleeping pill. 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  Quit smoking 2009, 1 beer/ month, little caffeine. Unmarried, living w/ sister, has girlfriend Fam hx- Father hx MI, Mother dcs'd DM  05/09/12- 13 yoM former smoker comes to reestablish for management of obstructive sleep apnea complicated by history of allergic rhinitis and atrial fibrillation, diabetes, HTN. PCP- Dr Trula Slade He returns to followup and reports use of CPAP 13/ [employer] most nights, all night " a whole 'nother world". He sleeps soundly and now with CPAP he is sleeping through his morning alarm clock which has caused him to miss work. Bedtime is 9:30 PM and he is trying to get up at 5:15 AM. Still drowsy after meals and if sitting quietly. He also has an urge to sleep if he gets angry and at that time recognizes dreaming. He does not specifically describe muscle weakness with these episodes. He gives a possible history of sleep paralysis.  ROS-see HPI Constitutional:   No-   weight loss, night sweats, fevers, chills,  +fatigue, lassitude. HEENT:   No-  headaches, difficulty swallowing, tooth/dental problems, sore throat,       No-  sneezing, itching, ear ache, nasal congestion, post nasal drip,  CV:  No-   chest  pain, orthopnea, PND, swelling in lower extremities, anasarca, dizziness, palpitations Resp: No-   shortness of breath with exertion or at rest.              No-   productive cough,  No non-productive cough,  No- coughing up of blood.              No-   change in color of mucus.  No- wheezing.   Skin: No-   rash or lesions. GI:  No-   heartburn, indigestion, abdominal pain, nausea, vomiting,  GU:  MS:  No-   joint pain or swelling.  Neuro-     nothing unusual Psych:  No- change in mood or affect. No depression or anxiety.  No memory loss.  OBJ- Physical Exam General- Alert, Oriented, Affect-appropriate, Distress- none acute, obese, intelligent Skin- rash-none, lesions- none, excoriation- none Lymphadenopathy- none Head- atraumatic            Eyes- Gross vision intact, PERRLA, conjunctivae and secretions clear            Ears- Hearing, canals-normal            Nose- rhinitis/ turbinate edema, no-Septal dev, mucus, polyps, erosion, perforation             Throat- Mallampati III , mucosa clear , drainage- none, tonsils- atrophic Neck- flexible , trachea midline, no stridor , thyroid nl, carotid no bruit Chest - symmetrical excursion , unlabored           Heart/CV- RRR , no murmur , no gallop  ,  no rub, nl s1 s2                           - JVD- none , edema- none, stasis changes- none, varices- none           Lung- clear to P&A, wheeze- none, cough- none , dullness-none, rub- none           Chest wall-  Abd-  Br/ Gen/ Rectal- Not done, not indicated Extrem- cyanosis- none, clubbing, none, atrophy- none, strength- nl Neuro- grossly intact to observation

## 2012-05-09 NOTE — Patient Instructions (Addendum)
Continue CPAP 13/ [facility] for now  Get an alarm clock that rings louder than your cell phone  It may help to try an occasional caffeine tablet, like NoDoz. Store brand would be ok.   I will send you a letter for your employer, indicating that you are under my care for your sleep problems.

## 2012-05-11 ENCOUNTER — Encounter (HOSPITAL_COMMUNITY): Payer: Self-pay | Admitting: *Deleted

## 2012-05-11 ENCOUNTER — Emergency Department (HOSPITAL_COMMUNITY)
Admission: EM | Admit: 2012-05-11 | Discharge: 2012-05-11 | Disposition: A | Discharge status: Home / Self Care | Payer: 59 | Source: Home / Self Care | Attending: Emergency Medicine | Admitting: Emergency Medicine

## 2012-05-11 DIAGNOSIS — L0390 Cellulitis, unspecified: Secondary | ICD-10-CM

## 2012-05-11 DIAGNOSIS — L0291 Cutaneous abscess, unspecified: Secondary | ICD-10-CM

## 2012-05-11 MED ORDER — LIDOCAINE HCL (PF) 1 % IJ SOLN
INTRAMUSCULAR | Status: AC
  Filled 2012-05-11: qty 5

## 2012-05-11 MED ORDER — SULFAMETHOXAZOLE-TMP DS 800-160 MG PO TABS: 2.0000 | ORAL_TABLET | Freq: Two times a day (BID) | ORAL | Status: AC

## 2012-05-11 MED ORDER — CEFTRIAXONE SODIUM 1 G IJ SOLR
INTRAMUSCULAR | Status: AC
  Filled 2012-05-11: qty 10

## 2012-05-11 MED ORDER — CEFTRIAXONE SODIUM 1 G IJ SOLR
1.0000 g | Freq: Once | INTRAMUSCULAR | Status: AC
  Administered 2012-05-11: 1 g via INTRAMUSCULAR

## 2012-05-11 MED ORDER — CEPHALEXIN 500 MG PO CAPS: 500.0000 mg | ORAL_CAPSULE | Freq: Three times a day (TID) | ORAL | Status: AC

## 2012-05-11 MED ORDER — TRAMADOL HCL 50 MG PO TABS: 100.0000 mg | ORAL_TABLET | Freq: Three times a day (TID) | ORAL | Status: AC | PRN

## 2012-05-11 NOTE — ED Notes (Signed)
C/O recurrent boils - c/o boil to right upper leg x 2 days without fevers.  Reports some nausea last night and today, which is now resolved.

## 2012-05-11 NOTE — ED Provider Notes (Addendum)
Chief Complaint  Patient presents with  . Abscess    History of Present Illness:    [name] is a 41 year old diabetic male who has had a three-day history of a painful abscess on his right medial thigh. This has not drained any pus. He's felt chilled and somewhat nauseated but denies any fever. He did have an abscess before, he thinks about a year ago. 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  Review of Systems:  Other than noted above, the patient denies any of the following symptoms: Systemic:  No fever, chills or sweats. Skin:  No rash or itching.  PMFSH:  Past medical history, family history, social history, meds, and allergies were reviewed.  No history of diabetes or prior history of abscesses or MRSA.  Physical Exam:   Vital signs:  BP 128/86  Pulse 96  Temp 99 F (37.2 C) (Oral)  Resp 18  SpO2 100% Skin:  On the right proximal medial thigh there is a 1 cm ulcerated area surrounded by a 5 cm area of induration and this was in turn surrounded by a palm sized area of erythema. There was no fluctuance and no drainage.  Skin exam was otherwise normal.  No rash. Ext:  Distal pulses were full, patient has full ROM of all joints.  Results for orders placed during the hospital encounter of 05/11/12  GLUCOSE, CAPILLARY      Component Value Range   Glucose-Capillary 317 (*) 70 - 99 mg/dL   Procedure:  Verbal informed consent was obtained.  The patient was informed of the risks and benefits of the procedure and understands and accepts.  Identity of the patient was verified verbally and by wristband.   The abscess area described above was prepped with Betadine and alcohol and anesthetized with 5 mL of 2% Xylocaine with epinephrine.  Using a #11 scalpel blade, a singe straight incision was made into the area of fluctulence, yielding a small amount of prurulent  drainage.  Routine cultures were obtained.  Blunt dissection was used to break up loculations and the resulting wound cavity was packed with 1/4 inch Iodoform gauze.  A sterile pressure dressing was applied.  Course in Urgent Care Center:   He was given Rocephin 1 g IM and tolerated this well without any immediate side effects.  Assessment:  The encounter diagnosis was Abscess.  Plan:   1.  The following meds were prescribed:   New Prescriptions   CEPHALEXIN (KEFLEX) 500 MG CAPSULE    Take 1 capsule (500 mg total) by mouth 3 (three) times daily.   SULFAMETHOXAZOLE-TRIMETHOPRIM (BACTRIM DS) 800-160 MG PER TABLET    Take 2 tablets by mouth 2 (two) times daily.   TRAMADOL (ULTRAM) 50 MG TABLET    Take 2 tablets (100 mg total) by mouth every 8 (eight) hours as needed for pain.   2.  The patient was instructed in symptomatic care and handouts were given. 3.  The patient was instructed to leave the dressing in place and return again in 48 hours for packing removal.   Reuben Likes, MD 05/11/12 1506  Reuben Likes, MD 05/11/12 [phone]

## 2012-05-13 ENCOUNTER — Encounter (HOSPITAL_COMMUNITY): Payer: Self-pay | Admitting: *Deleted

## 2012-05-13 ENCOUNTER — Emergency Department (HOSPITAL_COMMUNITY)
Admission: EM | Admit: 2012-05-13 | Discharge: 2012-05-13 | Disposition: A | Discharge status: Home / Self Care | Payer: 59 | Source: Home / Self Care | Attending: Emergency Medicine | Admitting: Emergency Medicine

## 2012-05-13 DIAGNOSIS — Z4801 Encounter for change or removal of surgical wound dressing: Secondary | ICD-10-CM

## 2012-05-13 DIAGNOSIS — Z888 Allergy status to other drugs, medicaments and biological substances status: Secondary | ICD-10-CM

## 2012-05-13 DIAGNOSIS — L0291 Cutaneous abscess, unspecified: Secondary | ICD-10-CM

## 2012-05-13 DIAGNOSIS — T7840XA Allergy, unspecified, initial encounter: Secondary | ICD-10-CM

## 2012-05-13 LAB — POCT I-STAT, CHEM 8
Hemoglobin: 16.3 g/dL (ref 13.0–17.0)
Sodium: 137 mEq/L (ref 135–145)
TCO2: 25 mmol/L (ref 0–100)

## 2012-05-13 MED ORDER — ONDANSETRON 8 MG PO TBDP: 8.0000 mg | ORAL_TABLET | Freq: Three times a day (TID) | ORAL | Status: AC | PRN

## 2012-05-13 MED ORDER — ONDANSETRON HCL 4 MG/2ML IJ SOLN
INTRAMUSCULAR | Status: AC
  Filled 2012-05-13: qty 2

## 2012-05-13 MED ORDER — ONDANSETRON HCL 4 MG/2ML IJ SOLN
4.0000 mg | Freq: Once | INTRAMUSCULAR | Status: AC
  Administered 2012-05-13: 4 mg via INTRAMUSCULAR

## 2012-05-13 NOTE — ED Notes (Signed)
Per pt here for wound check and packing removal - also with c/o nausea after taking medications - took his diabetes med then took antibiotics/tramadol approx 3 hours later - yesterday took antibiotics/tramadol  but did not take diabetes medication did not experience nausea - pt dry heaving in room

## 2012-05-13 NOTE — ED Provider Notes (Signed)
Chief Complaint  Patient presents with  . Wound Check  . Nausea    History of Present Illness:    The patient is a 41 year old male whom I saw a couple days ago with an abscess in his right medial thigh. This was incised and drained and packing was put in. He returns today for packing removed in for recheck. He states the abscess feels a lot better. He doesn't have any pain at all. The packing is still in place. Over the past day he's felt somewhat jittery, his vision has been blurry he felt nauseated and vomited a couple times, he noted rapid heartbeat and some chills and generalized itching. He was sent home on Septra and cephalexin. His cultures now growing out strep which should be sensitive to the cephalexin. He denies any skin rash. He's had no fever, chest pain, shortness of breath, dizziness, syncope, abdominal pain, or diarrhea. His blood sugars have been under good control today.  Review of Systems:  Other than noted above, the patient denies any of the following symptoms: Systemic:  No fever, chills or sweats. Skin:  No rash or itching.  PMFSH:  Past medical history, family history, social history, meds, and allergies were reviewed.  No history of diabetes or prior history of abscesses or MRSA.  Physical Exam:   Vital signs:  BP 104/66  Pulse 80  Temp 98.3 F (36.8 C) (Oral)  Resp 16  SpO2 97% Lungs: Clear to auscultation. Heart: Regular rhythm, no gallop or murmur. Abdomen: Soft, nontender, no organomegaly or mass. Skin:  His skin was clear, warm, and dry. There is no rash or breaking out. The abscess looks about the same as it did when I saw him 2 days ago. Stiffly no worse. He's got packing in place and is removed. The abscess cavity looks good. It still little surrounding erythema but not much tenderness to palpation.  Skin exam was otherwise normal.  No rash. Ext:  Distal pulses were full, patient has full ROM of all joints.  Procedure:  Verbal informed consent was  obtained.  The patient was informed of the risks and benefits of the procedure and understands and accepts.  Identity of the patient was verified verbally and by wristband. The packing was removed in a sterile, clean, dry dressing was applied.  Results for orders placed during the hospital encounter of 05/13/12  POCT I-STAT, CHEM 8      Component Value Range   Sodium 137  135 - 145 mEq/L   Potassium 4.1  3.5 - 5.1 mEq/L   Chloride 100  96 - 112 mEq/L   BUN 15  6 - 23 mg/dL   Creatinine, Ser 1.61 (*) 0.50 - 1.35 mg/dL   Glucose, Bld 096 (*) 70 - 99 mg/dL   Calcium, Ion 0.45  4.09 - 1.23 mmol/L   TCO2 25  0 - 100 mmol/L   Hemoglobin 16.3  13.0 - 17.0 g/dL   HCT 81.1  91.4 - 78.2 %   Course in Urgent Care Center:   He was given Zofran 4 mg IM and tolerated this well without any immediate side effects. He did not have any further episodes of vomiting and was able to keep liquids down without any difficulty.  Assessment:  The primary encounter diagnosis was Abscess. A diagnosis of Allergic reaction to drug was also pertinent to this visit. His abscess looks like it's getting better. I think he may be having an allergic reaction to Septra. I told him  to stop the Septra to continue the cephalexin, and return in 48 hours if he wasn't feeling better.  Plan:   1.  The following meds were prescribed:   New Prescriptions   ONDANSETRON (ZOFRAN ODT) 8 MG DISINTEGRATING TABLET    Take 1 tablet (8 mg total) by mouth every 8 (eight) hours as needed for nausea.   2.  The patient was instructed in symptomatic care and handouts were given. 3.  The patient was instructed in wound care.   Reuben Likes, MD 05/13/12 [phone]

## 2012-05-13 NOTE — ED Notes (Signed)
Pt feeling better nausea resolving no further vomiting   To stop septra take cephalexin only  Instructions reviewed

## 2012-05-14 LAB — CULTURE, ROUTINE-ABSCESS

## 2012-05-14 NOTE — Assessment & Plan Note (Signed)
He describes good compliance and control with CPAP, also documented by his download. He is describing significant residual daytime sleepiness and what may be cataplexy. I will send a letter indicating he is under my care for management of sleep problems which he may need to show to his employer. I emphasized his responsibility to drive safely or get somebody else to drive. He will continue to pay attention to good sleep habits and might try extra alarm clocks, caffeine tablets and other simple measures. We may need to get a multiple sleep latency test.

## 2012-05-14 NOTE — Assessment & Plan Note (Signed)
I am watching to see if he needs additional evaluation for narcolepsy, if control of sleep apnea does not better impact daytime sleepiness.

## 2012-05-16 NOTE — ED Notes (Signed)
Abscess culture R thigh: Abundant Group B strep (S. Agalactiae). Pt. treated with Keflex. No sensitivity due to predictability of PCN.  Lab shown to Dr. Lorenz Coaster and he said it should be OK. [name] 05/16/2012

## 2012-05-24 ENCOUNTER — Ambulatory Visit (INDEPENDENT_AMBULATORY_CARE_PROVIDER_SITE_OTHER): Payer: 59 | Admitting: Emergency Medicine

## 2012-05-24 ENCOUNTER — Encounter: Payer: Self-pay | Admitting: Internal Medicine

## 2012-05-24 ENCOUNTER — Telehealth: Payer: Self-pay | Admitting: Internal Medicine

## 2012-05-24 VITALS — BP 125/80 | HR 83 | Temp 98.0°F | Resp 16 | Ht 68.5 in | Wt 315.0 lb

## 2012-05-24 DIAGNOSIS — M7700 Medial epicondylitis, unspecified elbow: Secondary | ICD-10-CM

## 2012-05-24 MED ORDER — NAPROXEN SODIUM 550 MG PO TABS: 550.0000 mg | ORAL_TABLET | Freq: Two times a day (BID) | ORAL | Status: DC

## 2012-05-24 MED ORDER — TENNIS ELBOW STRAP/AIR PAD MISC: 1.0000 | Freq: Every day | Status: DC

## 2012-05-24 NOTE — Telephone Encounter (Signed)
Per Dr. Maple Hudson last OV instructions:  I will send you a letter for your employer, indicating that you are under my care for your sleep problems   Pt is calling and wanting to know the status of this letter. Please Dr. Maple Hudson thanks

## 2012-05-24 NOTE — Patient Instructions (Signed)
Medial Epicondylitis (Golfer's Elbow) with Rehab Medial epicondylitis involves inflammation and pain around the inner (medial) portion of the elbow. This pain is caused by inflammation of the tendons in the forearm that flex (bring down) the wrist. Medial epicondylitis is also called golfer's elbow, because it is common among golfers. However, it may occur in any individual who flexes the wrist regularly. If medial epicondylitis is left untreated, it may become a chronic problem. SYMPTOMS   Pain, tenderness, or inflammation over the inner (medial) side of the elbow.   Pain or weakness with gripping activities.   Pain that increases with wrist twisting motions (using a screwdriver, playing golf, bowling).  CAUSES  Medial epicondylitis is caused by inflammation of the tendons that flex the wrist. Causes of injury may include:  Chronic, repetitive stress and strain to the tendons that run from the wrist and forearm to the elbow.   Sudden strain on the forearm, including wrist snap when serving balls with racquet sports, or throwing a baseball.  RISK INCREASES WITH:  Sports or occupations that require repetitive and/or strenuous forearm and wrist movements (pitching a baseball, golfing, carpentry).   Poor wrist and forearm strength and flexibility.   Failure to warm up properly before activity.   Resuming activity before healing, rehabilitation, and conditioning are complete.  PREVENTION   Warm up and stretch properly before activity.   Maintain physical fitness:   Strength, flexibility, and endurance.   Cardiovascular fitness.   Wear and use properly fitted equipment.   Learn and use proper technique and have a coach correct improper technique.   Wear a tennis elbow (counterforce) brace.  PROGNOSIS  The course of this condition depends on the degree of the injury. If treated properly, acute cases (symptoms lasting less than 4 weeks) are often resolved in 2 to 6 weeks. Chronic  (longer lasting cases) often resolve in 3 to 6 months, but may require physical therapy. RELATED COMPLICATIONS   Frequently recurring symptoms, resulting in a chronic problem. Properly treating the problem the first time decreases frequency of recurrence.   Chronic inflammation, scarring, and partial tendon tear, requiring surgery.   Delayed healing or resolution of symptoms.  TREATMENT  Treatment first involves the use of ice and medicine, to reduce pain and inflammation. Strengthening and stretching exercises may reduce discomfort, if performed regularly. These exercises may be performed at home, if the condition is an acute injury. Chronic cases may require a referral to a physical therapist for evaluation and treatment. Your caregiver may advise a corticosteroid injection to help reduce inflammation. Rarely, surgery is needed. MEDICATION  If pain medicine is needed, nonsteroidal anti-inflammatory medicines (aspirin and ibuprofen), or other minor pain relievers (acetaminophen), are often advised.   Do not take pain medicine for 7 days before surgery.   Prescription pain relievers may be given, if your caregiver thinks they are needed. Use only as directed and only as much as you need.   Corticosteroid injections may be recommended. These injections should be reserved only for the most severe cases, because they can only be given a certain number of times.  HEAT AND COLD  Cold treatment (icing) should be applied for 10 to 15 minutes every 2 to 3 hours for inflammation and pain, and immediately after activity that aggravates your symptoms. Use ice packs or an ice massage.   Heat treatment may be used before performing stretching and strengthening activities prescribed by your caregiver, physical therapist, or athletic trainer. Use a heat pack or a   warm water soak.  SEEK MEDICAL CARE IF: Symptoms get worse or do not improve in 2 weeks, despite treatment. EXERCISES  RANGE OF MOTION (ROM)  AND STRETCHING EXERCISES - Epicondylitis, Medial (Golfer's Elbow) These exercises may help you when beginning to rehabilitate your injury. Your symptoms may go away with or without further involvement from your physician, physical therapist or athletic trainer. While completing these exercises, remember:   Restoring tissue flexibility helps normal motion to return to the joints. This allows healthier, less painful movement and activity.   An effective stretch should be held for at least 30 seconds.   A stretch should never be painful. You should only feel a gentle lengthening or release in the stretched tissue.  RANGE OF MOTION - Wrist Flexion, Active-Assisted  Extend your right / left elbow with your fingers pointing down.*   Gently pull the back of your hand towards you, until you feel a gentle stretch on the top of your forearm.   Hold this position for __________ seconds.  Repeat __________ times. Complete this exercise __________ times per day.  *If directed by your physician, physical therapist or athletic trainer, complete this stretch with your elbow bent, rather than extended. RANGE OF MOTION - Wrist Extension, Active-Assisted  Extend your right / left elbow and turn your palm upwards.*   Gently pull your palm and fingertips back, so your wrist extends and your fingers point more toward the ground.   You should feel a gentle stretch on the inside of your forearm.   Hold this position for __________ seconds.  Repeat __________ times. Complete this exercise __________ times per day. *If directed by your physician, physical therapist or athletic trainer, complete this stretch with your elbow bent, rather than extended. STRETCH - Wrist Extension   Place your right / left fingertips on a tabletop leaving your elbow slightly bent. Your fingers should point backwards.   Gently press your fingers and palm down onto the table, by straightening your elbow. You should feel a stretch on  the inside of your forearm.   Hold this position for __________ seconds.  Repeat __________ times. Complete this stretch __________ times per day.  STRENGTHENING EXERCISES - Epicondylitis, Medial (Golfer's Elbow) These exercises may help you when beginning to rehabilitate your injury. They may resolve your symptoms with or without further involvement from your physician, physical therapist or athletic trainer. While completing these exercises, remember:   Muscles can gain both the endurance and the strength needed for everyday activities through controlled exercises.   Complete these exercises as instructed by your physician, physical therapist or athletic trainer. Increase the resistance and repetitions only as guided.   You may experience muscle soreness or fatigue, but the pain or discomfort you are trying to eliminate should never worsen during these exercises. If this pain does get worse, stop and make sure you are following the directions exactly. If the pain is still present after adjustments, discontinue the exercise until you can discuss the trouble with your caregiver.  STRENGTH - Wrist Flexors  Sit with your right / left forearm palm-up, and fully supported on a table or countertop. Your elbow should be resting below the height of your shoulder. Allow your wrist to extend over the edge of the surface.   Loosely holding a __________ weight, or a piece of rubber exercise band or tubing, slowly curl your hand up toward your forearm.   Hold this position for __________ seconds. Slowly lower the wrist back to the starting   position in a controlled manner.  Repeat __________ times. Complete this exercise __________ times per day.  STRENGTH - Wrist Extensors  Sit with your right / left forearm palm-down and fully supported. Your elbow should be resting below the height of your shoulder. Allow your wrist to extend over the edge of the surface.   Loosely holding a __________ weight, or a  piece of rubber exercise band or tubing, slowly curl your hand up toward your forearm.   Hold this position for __________ seconds. Slowly lower the wrist back to the starting position in a controlled manner.  Repeat __________ times. Complete this exercise __________ times per day.  STRENGTH - Ulnar Deviators  Stand with a ____________________ weight in your right / left hand, or sit while holding a rubber exercise band or tubing, with your healthy arm supported on a table or countertop.   Move your wrist so that your pinkie travels toward your forearm and your thumb moves away from your forearm.   Hold this position for __________ seconds and then slowly lower the wrist back to the starting position.  Repeat __________ times. Complete this exercise __________ times per day STRENGTH - Grip   Grasp a tennis ball, a dense sponge, or a large, rolled sock in your hand.   Squeeze as hard as you can, without increasing any pain.   Hold this position for __________ seconds. Release your grip slowly.  Repeat __________ times. Complete this exercise __________ times per day.  STRENGTH - Forearm Supinators   Sit with your right / left forearm supported on a table, keeping your elbow below shoulder height. Rest your hand over the edge, palm down.   Gently grip a hammer or a soup ladle.   Without moving your elbow, slowly turn your palm and hand upward to a "thumbs-up" position.   Hold this position for __________ seconds. Slowly return to the starting position.  Repeat __________ times. Complete this exercise __________ times per day.  STRENGTH - Forearm Pronators  Sit with your right / left forearm supported on a table, keeping your elbow below shoulder height. Rest your hand over the edge, palm up.   Gently grip a hammer or a soup ladle.   Without moving your elbow, slowly turn your palm and hand upward to a "thumbs-up" position.   Hold this position for __________ seconds. Slowly  return to the starting position.  Repeat __________ times. Complete this exercise __________ times per day.  Document Released: 10/02/2005 Document Revised: 09/21/2011 Document Reviewed: 01/14/2009 [employer] Patient Information 2012 [employer]. 

## 2012-05-24 NOTE — Telephone Encounter (Signed)
Letter dictated

## 2012-05-24 NOTE — Progress Notes (Signed)
   Date:  05/24/2012   Name:  [name]   DOB:  [birth year]   MRN:  [id number] Gender: male  Age: 41 y.o.  PCP:  Katy Apo, MD    Chief Complaint: Arm Pain   History of Present Illness:  [name] is a 41 y.o. pleasant patient who presents with the following:  One year history of pain in medial epicondyle.  Told by one physician that he was sleeping on his arm wrong.  Regular pain in medical elbow with work activities  No history of injury  Patient Active Problem List  Diagnosis  . DIABETES MELLITUS, TYPE II  . DYSLIPIDEMIA  . OBESITY, MORBID  . COCAINE ABUSE  . SUBSTANCE ABUSE  . SLEEP APNEA, OBSTRUCTIVE  . HYPERTENSION  . ALLERGIC RHINITIS  . BLOOD IN STOOL  . FECAL OCCULT BLOOD  . ATRIAL FIBRILLATION, HX OF  . Shoulder pain, right  . Hypersomnia, organic    Past Medical History  Diagnosis Date  . Hypertension   . Diabetes mellitus   . Sleep apnea   . Overweight     Past Surgical History  Procedure Date  . Appendectomy   . Joint replacement     History  Substance Use Topics  . Smoking status: Former Smoker -- 1.0 packs/day for 10 years    Quit date: 10/17/2007  . Smokeless tobacco: Current User    Types: Snuff  . Alcohol Use: Yes     occasionally    Family History  Problem Relation Age of Onset  . Heart attack Father   . Diabetes Mother     Allergies  Allergen Reactions  . Shrimp (Shellfish Allergy)   . Sulfa Antibiotics   . Watermelon Flavor     Medication list has been reviewed and updated.  Current Outpatient Prescriptions on File Prior to Visit  Medication Sig Dispense Refill  . diltiazem (CARDIZEM CD) 360 MG 24 hr capsule Take 360 mg by mouth daily.      Marland Kitchen glipiZIDE (GLUCOTROL) 10 MG 24 hr tablet Take 10 mg by mouth Twice daily.      Marland Kitchen LANTUS SOLOSTAR 100 UNIT/ML injection 10 units qd      . lisinopril-hydrochlorothiazide (PRINZIDE,ZESTORETIC) 10-12.5 MG per tablet Take 1 tablet by mouth daily.      . metFORMIN  (GLUCOPHAGE) 500 MG tablet Take 1,000 mg by mouth 2 (two) times daily.       . fluticasone (FLONASE) 50 MCG/ACT nasal spray 1-2 sprays each nostril twice daily every day  16 g  2    Review of Systems:  As per HPI, otherwise negative.    Physical Examination: Filed Vitals:   05/24/12 0940  BP: 125/80  Pulse: 83  Temp: 98 F (36.7 C)  Resp: 16   Filed Vitals:   05/24/12 0940  Height: 5' 8.5" (1.74 m)  Weight: 315 lb (142.883 kg)   Body mass index is 47.20 kg/(m^2). Ideal Body Weight: Weight in (lb) to have BMI = 25: 166.5    GEN: WDWN, NAD, Non-toxic, Alert & Oriented x 3 HEENT: Atraumatic, Normocephalic.  Ears and Nose: No external deformity. EXTR: No clubbing/cyanosis/edema NEURO: Normal gait.  PSYCH: Normally interactive. Conversant. Not depressed or anxious appearing.  Calm demeanor.  Left elbow tender at medial epicondyle.  Full ROM  NATI  Assessment and Plan: Medial epicondylitis Brace Anaprox  Follow up in two weeks if no improvement consider injection  Carmelina Dane, MD

## 2012-05-27 NOTE — Telephone Encounter (Signed)
Letter signed and placed at front desk for pt to pick up. Left message for patient to call back.

## 2012-05-27 NOTE — Telephone Encounter (Signed)
Pt returned call and is aware letter is ready to be picked up. Nothing further needed per pt.

## 2012-06-24 ENCOUNTER — Ambulatory Visit: Payer: Self-pay | Admitting: Internal Medicine

## 2012-08-02 ENCOUNTER — Ambulatory Visit: Payer: Self-pay | Admitting: Internal Medicine

## 2012-09-25 ENCOUNTER — Encounter (HOSPITAL_COMMUNITY): Payer: Self-pay | Admitting: *Deleted

## 2012-09-25 ENCOUNTER — Emergency Department (HOSPITAL_COMMUNITY)
Admission: EM | Admit: 2012-09-25 | Discharge: 2012-09-25 | Disposition: A | Discharge status: Home / Self Care | Payer: 59 | Attending: Emergency Medicine | Admitting: Emergency Medicine

## 2012-09-25 ENCOUNTER — Emergency Department (HOSPITAL_COMMUNITY): Payer: 59

## 2012-09-25 DIAGNOSIS — R110 Nausea: Secondary | ICD-10-CM | POA: Insufficient documentation

## 2012-09-25 DIAGNOSIS — E119 Type 2 diabetes mellitus without complications: Secondary | ICD-10-CM | POA: Insufficient documentation

## 2012-09-25 DIAGNOSIS — Z9089 Acquired absence of other organs: Secondary | ICD-10-CM | POA: Insufficient documentation

## 2012-09-25 DIAGNOSIS — E663 Overweight: Secondary | ICD-10-CM | POA: Insufficient documentation

## 2012-09-25 DIAGNOSIS — G4730 Sleep apnea, unspecified: Secondary | ICD-10-CM | POA: Insufficient documentation

## 2012-09-25 DIAGNOSIS — R109 Unspecified abdominal pain: Secondary | ICD-10-CM | POA: Insufficient documentation

## 2012-09-25 DIAGNOSIS — F17200 Nicotine dependence, unspecified, uncomplicated: Secondary | ICD-10-CM | POA: Insufficient documentation

## 2012-09-25 DIAGNOSIS — R194 Change in bowel habit: Secondary | ICD-10-CM

## 2012-09-25 DIAGNOSIS — I10 Essential (primary) hypertension: Secondary | ICD-10-CM | POA: Insufficient documentation

## 2012-09-25 DIAGNOSIS — Z794 Long term (current) use of insulin: Secondary | ICD-10-CM | POA: Insufficient documentation

## 2012-09-25 DIAGNOSIS — K3189 Other diseases of stomach and duodenum: Secondary | ICD-10-CM | POA: Insufficient documentation

## 2012-09-25 DIAGNOSIS — Z79899 Other long term (current) drug therapy: Secondary | ICD-10-CM | POA: Insufficient documentation

## 2012-09-25 LAB — COMPREHENSIVE METABOLIC PANEL
ALT: 36 U/L (ref 0–53)
AST: 28 U/L (ref 0–37)
Albumin: 3.5 g/dL (ref 3.5–5.2)
Alkaline Phosphatase: 73 U/L (ref 39–117)
BUN: 7 mg/dL (ref 6–23)
CO2: 28 mEq/L (ref 19–32)
Calcium: 9.2 mg/dL (ref 8.4–10.5)
Chloride: 98 mEq/L (ref 96–112)
Creatinine, Ser: 0.61 mg/dL (ref 0.50–1.35)
GFR calc Af Amer: >90 mL/min (ref >90)
GFR calc non Af Amer: >90 mL/min (ref >90)
Glucose, Bld: 164 mg/dL — ABNORMAL HIGH (ref 70–99)
Potassium: 3.9 mEq/L (ref 3.5–5.1)
Sodium: 134 mEq/L — ABNORMAL LOW (ref 135–145)
Total Bilirubin: 0.4 mg/dL (ref 0.3–1.2)
Total Protein: 7.1 g/dL (ref 6.0–8.3)

## 2012-09-25 LAB — CBC WITH DIFFERENTIAL/PLATELET
Basophils Absolute: 0 10*3/uL (ref 0.0–0.1)
Basophils Relative: 1 % (ref 0–1)
Eosinophils Absolute: 0.4 10*3/uL (ref 0.0–0.7)
Eosinophils Relative: 5 % (ref 0–5)
HCT: 40.7 % (ref 39.0–52.0)
Hemoglobin: 14.4 g/dL (ref 13.0–17.0)
Lymphocytes Relative: 29 % (ref 12–46)
Lymphs Abs: 2.1 10*3/uL (ref 0.7–4.0)
MCH: 28.1 pg (ref 26.0–34.0)
MCHC: 35.4 g/dL (ref 30.0–36.0)
MCV: 79.3 fL (ref 78.0–100.0)
Monocytes Absolute: 0.5 10*3/uL (ref 0.1–1.0)
Monocytes Relative: 7 % (ref 3–12)
Neutro Abs: 4.1 10*3/uL (ref 1.7–7.7)
Neutrophils Relative %: 58 % (ref 43–77)
Platelets: 275 10*3/uL (ref 150–400)
RBC: 5.13 MIL/uL (ref 4.22–5.81)
RDW: 12.8 % (ref 11.5–15.5)
WBC: 7.1 10*3/uL (ref 4.0–10.5)

## 2012-09-25 LAB — LIPASE, BLOOD: Lipase: 27 U/L (ref 11–59)

## 2012-09-25 IMAGING — CR DG ABDOMEN 1V
2 series · 2 of 2 positions shown · non-contrast
Comparison: [DATE]

CLINICAL DATA: Abdominal pain and discomfort.

ABDOMEN - 1 VIEW

[t abdomen supine (1 of 2)]
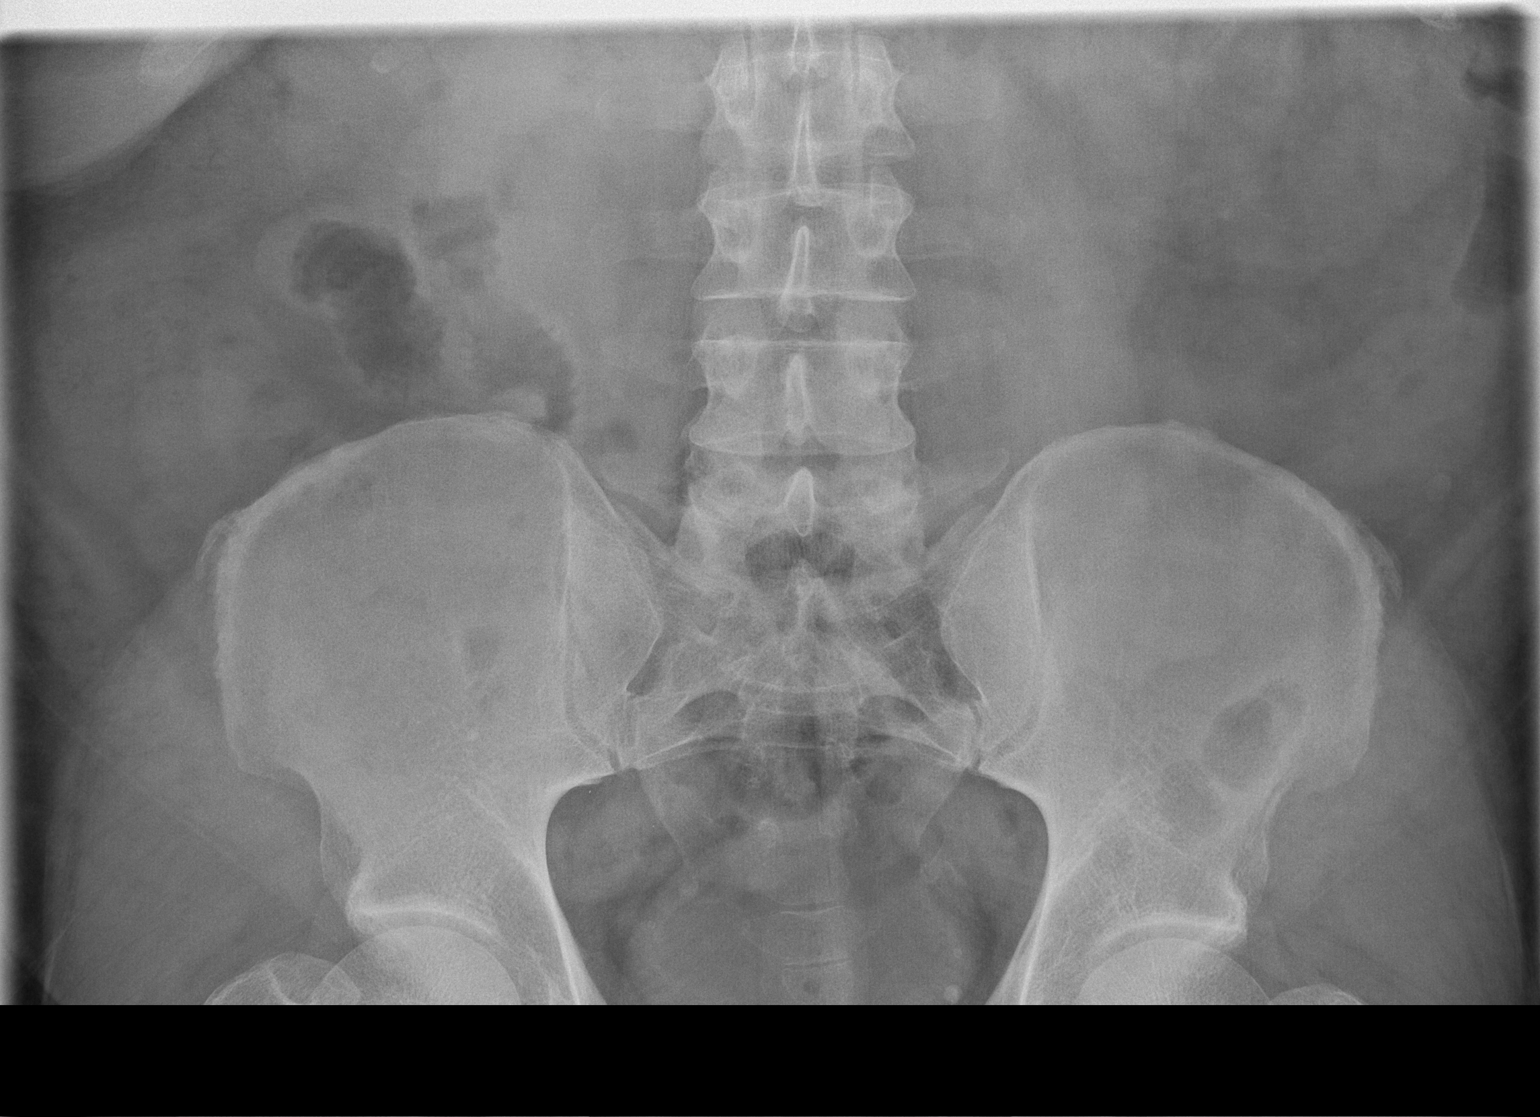

[t abdomen supine (2 of 2)]
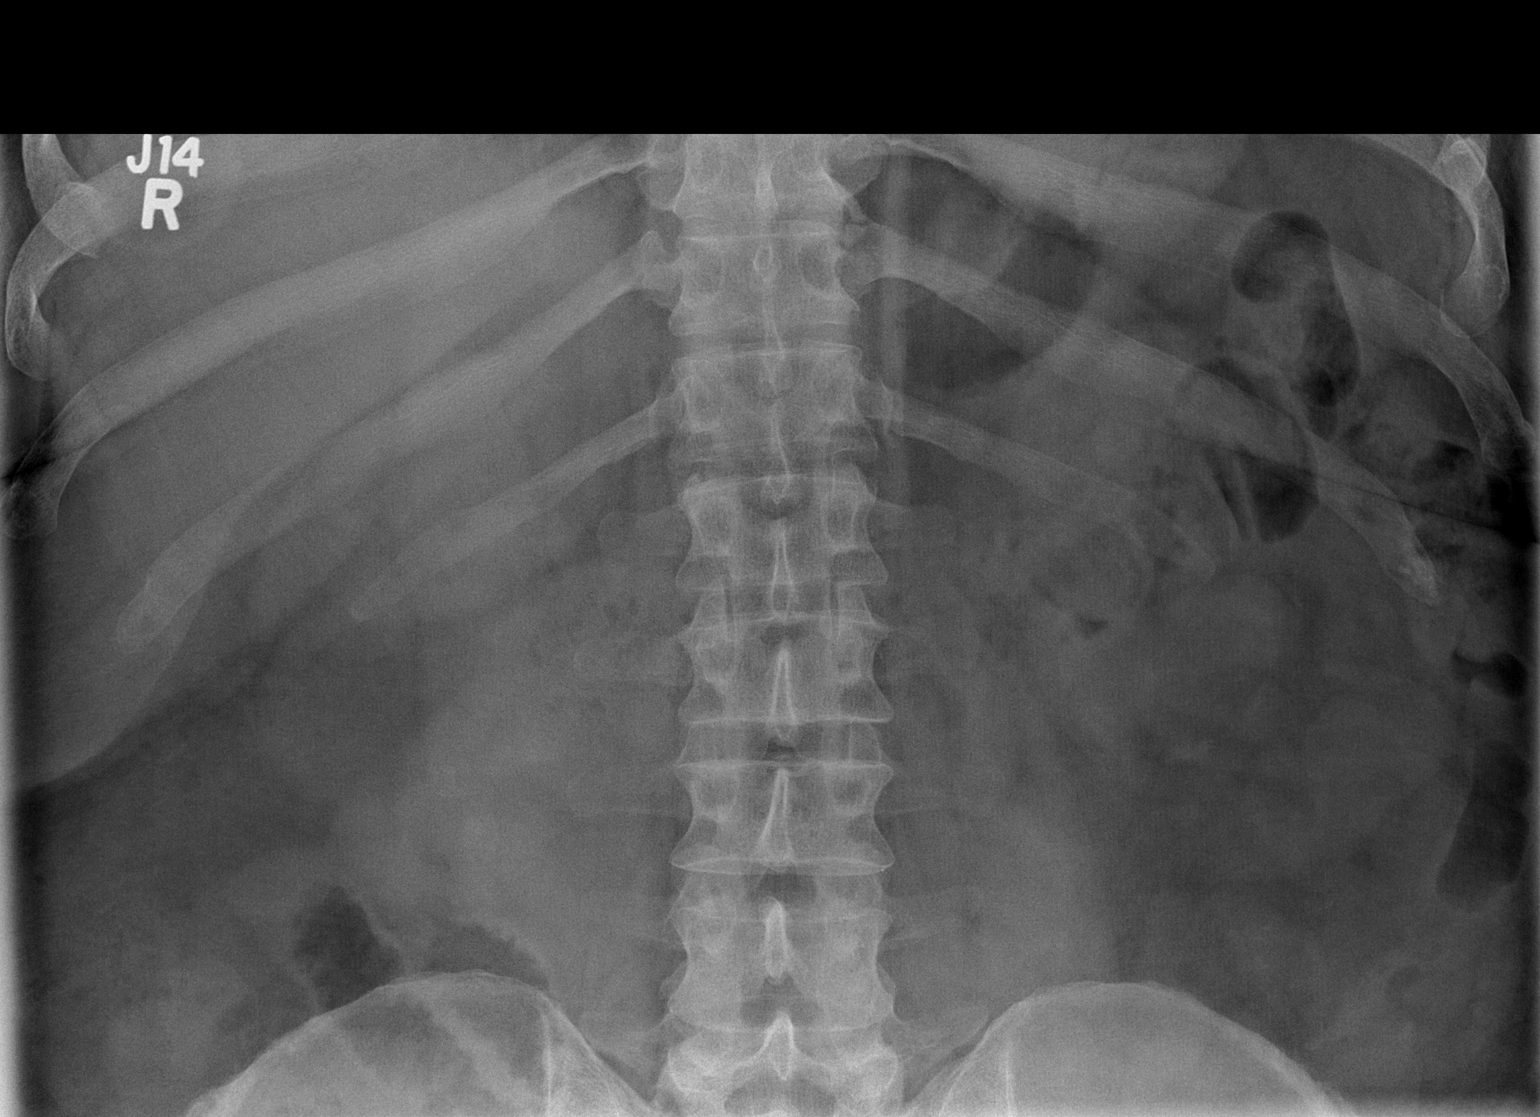

[2 of 2 positions shown; findings below may reference images not displayed]

FINDINGS: Supine abdomen shows no gaseous bowel dilatation to
suggest obstruction.  Phleboliths overlie the left anatomic pelvis,
but otherwise no unexpected abdominopelvic calcification is
evident.  Visualized bony structures are unremarkable.
IMPRESSION: Normal bowel gas pattern.

## 2012-09-25 MED ORDER — DOCUSATE SODIUM 100 MG PO CAPS: 100.0000 mg | ORAL_CAPSULE | Freq: Two times a day (BID) | ORAL | Status: DC

## 2012-09-25 MED ORDER — POLYETHYLENE GLYCOL 3350 17 G PO PACK: 17.0000 g | PACK | Freq: Every day | ORAL | Status: DC

## 2012-09-25 NOTE — ED Notes (Signed)
Pt reports no "regular BM since before Thanksgiving." Sts "a little came out this am." C/o abd pain, cramping. Has not tried anything OTC at home, has not called PCP.

## 2012-09-25 NOTE — ED Provider Notes (Signed)
History     CSN: [id number]  Arrival date & time 09/25/12  [id number]   First MD Initiated Contact with Patient 09/25/12 [phone]      Chief Complaint  Patient presents with  . Constipation    (Consider location/radiation/quality/duration/timing/severity/associated sxs/prior treatment) HPI The patient presents to the ED with constipation for two weeks.  He reports last BM was this morning.  He reports smaller BMs prior to Thanksgiving.  He reports nausea without vomiting.  He reports lower, and periumbilical  abdominal pain.  He states he has a history of internal and external hemorrhoids. He states the only change in diet has been friend chicken instead of boiled chicken, and has added metamucil.  Denies testicular pain or swelling.  History of Appendectomy several years ago and last colonoscopy was 4 years ago.  Denies flank or back pain, denies hematuria, chest pain, SOB, vomiting, weakness, headache, back pain, fever or chills.  Past Medical History  Diagnosis Date  . Hypertension   . Diabetes mellitus   . Sleep apnea   . Overweight     Past Surgical History  Procedure Date  . Appendectomy   . Joint replacement   . Rotator cuff repair   . Tonsillectomy     Family History  Problem Relation Age of Onset  . Heart attack Father   . Diabetes Mother     History  Substance Use Topics  . Smoking status: Current Some Day Smoker -- 1.0 packs/day for 10 years    Last Attempt to Quit: 10/17/2007  . Smokeless tobacco: Current User    Types: Snuff  . Alcohol Use: Yes     Comment: occasionally      Review of Systems All other systems negative except as documented in the HPI. All pertinent positives and negatives as reviewed in the HPI.  Allergies  Shrimp; Sulfa antibiotics; and Watermelon flavor  Home Medications   Current Outpatient Rx  Name  Route  Sig  Dispense  Refill  . DILTIAZEM HCL ER COATED BEADS 360 MG PO CP24   Oral   Take 360 mg by mouth daily.         .  TENNIS ELBOW STRAP/AIR PAD MISC   Does not apply   1 each by Does not apply route daily.   1 each   0   . FLUTICASONE PROPIONATE 50 MCG/ACT NA SUSP      1-2 sprays each nostril twice daily every day   16 g   2   . GLIPIZIDE ER 10 MG PO TB24   Oral   Take 10 mg by mouth Twice daily.         Marland Kitchen LANTUS SOLOSTAR 100 UNIT/ML [place] SOLN      10 units qd         . LISINOPRIL-HYDROCHLOROTHIAZIDE 10-12.5 MG PO TABS   Oral   Take 1 tablet by mouth daily.         Marland Kitchen METFORMIN HCL 500 MG PO TABS   Oral   Take 1,000 mg by mouth 2 (two) times daily.          Marland Kitchen NAPROXEN SODIUM 550 MG PO TABS   Oral   Take 1 tablet (550 mg total) by mouth 2 (two) times daily with a meal.   60 tablet   0     BP 155/99  Pulse 81  Temp 97.7 F (36.5 C) (Oral)  Resp 16  SpO2 98%  Physical Exam  Nursing note and vitals  reviewed. Constitutional: He is oriented to person, place, and time. He appears well-developed and well-nourished.  HENT:  Head: Normocephalic and atraumatic.  Mouth/Throat: Oropharynx is clear and moist. No oropharyngeal exudate.  Eyes: Pupils are equal, round, and reactive to light.  Neck: Normal range of motion. Neck supple.  Cardiovascular: Normal rate, regular rhythm, S1 normal, S2 normal and normal heart sounds.   Pulmonary/Chest: Effort normal and breath sounds normal. He has no decreased breath sounds. He has no wheezes. He has no rhonchi. He has no rales.  Abdominal: Soft. Normal appearance and bowel sounds are normal. There is tenderness in the epigastric area. There is no guarding, no CVA tenderness, no tenderness at McBurney's point and negative Murphy's sign. No hernia.  Neurological: He is alert and oriented to person, place, and time.  Skin: Skin is warm and dry. No rash noted.    ED Course  Procedures (including critical care time)  Labs Reviewed  COMPREHENSIVE METABOLIC PANEL - Abnormal; Notable for the following:    Sodium 134 (*)     Glucose, Bld 164 (*)      All other components within normal limits  CBC WITH DIFFERENTIAL  LIPASE, BLOOD    1015 Discussed tests results with patient and treatment plan, patient agreed to to treatment plan.  Denies pain and reports mild nausea after eating in the ED, resolved prior to re-evaluation.   The patient will be advised to return here as needed. Follow up with his PCP. Told to return here as needed. The patient states that his is having smaller BMs than normal.  MDM  MDM Reviewed: nursing note and vitals Interpretation: labs and x-ray          Carlyle Dolly, PA-C 09/25/12 1043

## 2012-09-25 NOTE — ED Provider Notes (Signed)
Medical screening examination/treatment/procedure(s) were performed by non-physician practitioner and as supervising physician I was immediately available for consultation/collaboration.  Marnell Mcdaniel, MD 09/25/12 1626 

## 2012-10-21 ENCOUNTER — Ambulatory Visit (INDEPENDENT_AMBULATORY_CARE_PROVIDER_SITE_OTHER): Payer: 59 | Admitting: Internal Medicine

## 2012-10-21 ENCOUNTER — Encounter: Payer: Self-pay | Admitting: Internal Medicine

## 2012-10-21 VITALS — BP 140/94 | HR 88 | Ht 69.0 in | Wt 325.0 lb

## 2012-10-21 DIAGNOSIS — G4733 Obstructive sleep apnea (adult) (pediatric): Secondary | ICD-10-CM

## 2012-10-21 DIAGNOSIS — E11319 Type 2 diabetes mellitus with unspecified diabetic retinopathy without macular edema: Secondary | ICD-10-CM

## 2012-10-21 DIAGNOSIS — G47419 Narcolepsy without cataplexy: Secondary | ICD-10-CM

## 2012-10-21 DIAGNOSIS — J302 Other seasonal allergic rhinitis: Secondary | ICD-10-CM

## 2012-10-21 DIAGNOSIS — J3089 Other allergic rhinitis: Secondary | ICD-10-CM

## 2012-10-21 DIAGNOSIS — E1139 Type 2 diabetes mellitus with other diabetic ophthalmic complication: Secondary | ICD-10-CM

## 2012-10-21 DIAGNOSIS — F1410 Cocaine abuse, uncomplicated: Secondary | ICD-10-CM

## 2012-10-21 DIAGNOSIS — J309 Allergic rhinitis, unspecified: Secondary | ICD-10-CM

## 2012-10-21 DIAGNOSIS — G4710 Hypersomnia, unspecified: Secondary | ICD-10-CM

## 2012-10-21 MED ORDER — FLUTICASONE PROPIONATE 50 MCG/ACT NA SUSP: NASAL | Status: DC

## 2012-10-21 NOTE — Patient Instructions (Addendum)
Order- [facility] schedule Multiple Sleep Latency Test  Dx Narcolepsy  You will need to wear your CPAP the night before this test, and bring your CPAP machine and mask with you for the MSLT test  Script refill Flonase  Order- [facility]- refer to Dr Fawn Kirk diabetic retinopathy  Sample Nuvigil 150 mg   1 each morning as needed

## 2012-10-21 NOTE — Progress Notes (Signed)
01/25/12- 25 yoM former smoker comes to reestablish for management of obstructive sleep apnea complicated by history of allergic rhinitis and atrial fibrillation, diabetes, HTN. PCP- Dr Trula Slade NPSG 11/23/00- AHI 20 per hour. He continues using CPAP all night every night. 14 CWP/[employer]. Using a fullface mask but putting it on so only covers his nose,  otherwise it smothers him. Fighting daytime sleepiness which affects his work. Hypnagogic hallucination associated with dreaming. 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 Vivid dreams as soon as he falls asleep. Wakes irritable from naps. Bedtime 9:30 PM, very brief sleep latency, wakes 4 times for nocturia before up at 4:30 AM. No sleeping pill. 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  Quit smoking 2009, 1 beer/ month, little caffeine. Unmarried, living w/ sister, has girlfriend Fam hx- Father hx MI, Mother dcs'd DM  05/09/12- 69 yoM former smoker comes to reestablish for management of obstructive sleep apnea complicated by history of allergic rhinitis and atrial fibrillation, diabetes, HTN. PCP- Dr Trula Slade He returns to followup and reports use of CPAP 13/ [employer] most nights, all night " a whole 'nother world". He sleeps soundly and now with CPAP he is sleeping through his morning alarm clock which has caused him to miss work. Bedtime is 9:30 PM and he is trying to get up at 5:15 AM. Still drowsy after meals and if sitting quietly. He also has an urge to sleep if he gets angry and at that time recognizes dreaming. He does not specifically describe muscle weakness with these episodes. He gives a possible history of sleep paralysis.  10/21/12- 41 yoM former smoker followed for management of obstructive sleep apnea/ hypersomnia complicated by history of allergic rhinitis and atrial fibrillation, diabetes, HTN. PCP- Dr Trula Slade FOLLOWS FOR: using CPAP/ 13/ [employer] every night for 5 hours per night. denies any  problems with the machine or the mask. He is still fighting daytime sleepiness and falling asleep at work. He struggles with this because he has to drive. Strong emotion makes him feel weak so that he needs to sit down. He recognizes sleep paralysis as an occasional event as well as hypnagogic hallucination. Caffeine has little daytime effect. 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 Incidental request for referral to an eye doctor because of his diabetes. He would like to go back to Dr. Luciana Axe who had seen him in the past. Rhinitis is improved by Flonase, asks refill.  ROS-see HPI Constitutional:   No-   weight loss, night sweats, fevers, chills,  +fatigue, lassitude. HEENT:   No-  headaches, difficulty swallowing, tooth/dental problems, sore throat,       No-  sneezing, itching, ear ache, nasal congestion, post nasal drip,  CV:  No-   chest pain, orthopnea, PND, swelling in lower extremities, anasarca, dizziness, palpitations Resp: No-   shortness of breath with exertion or at rest.              No-   productive cough,  No non-productive cough,  No- coughing up of blood.              No-   change in color of mucus.  No- wheezing.   Skin: No-   rash or lesions. GI:  No-   heartburn, indigestion, abdominal pain, nausea, vomiting,  GU:  MS:  No-   joint pain or swelling.  Neuro-     nothing unusual  Psych:  No- change in mood or affect. No depression or anxiety.  No memory loss.  OBJ- Physical Exam General- Alert, Oriented, Affect-appropriate, Distress- none acute, obese, intelligent == He had dozed off in the exam room waiting for me and drooled on his shirt== Skin- rash-none, lesions- none, excoriation- none Lymphadenopathy- none Head- atraumatic            Eyes- Gross vision intact, PERRLA, conjunctivae and secretions clear            Ears- Hearing, canals-normal            Nose- rhinitis/ turbinate edema,  no-Septal dev, mucus, polyps, erosion, perforation             Throat- Mallampati III , mucosa clear , drainage- none, tonsils- atrophic Neck- flexible , trachea midline, no stridor , thyroid nl, carotid no bruit Chest - symmetrical excursion , unlabored           Heart/CV- RRR , no murmur , no gallop  , no rub, nl s1 s2                           - JVD- none , edema- none, stasis changes- none, varices- none           Lung- clear to P&A, wheeze- none, cough- none , dullness-none, rub- none           Chest wall-  Abd-  Br/ Gen/ Rectal- Not done, not indicated Extrem- cyanosis- none, clubbing, none, atrophy- none, strength- nl Neuro- grossly intact to observation

## 2012-10-23 ENCOUNTER — Telehealth: Payer: Self-pay | Admitting: Internal Medicine

## 2012-10-23 NOTE — Telephone Encounter (Signed)
Per CY---it is fine to take.  That was the idea.  thanks

## 2012-10-23 NOTE — Telephone Encounter (Signed)
Last OV 10-21-12. 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 He was concerned that he should not take it while driving. Pt has taken one tablet yesterday and did not have any issues.  Pt just wanted to make sure that Dr. Maple Hudson was aware that he drove a lot for his job. Please advise. Carron Curie, CMA

## 2012-10-23 NOTE — Telephone Encounter (Signed)
Called spoke with patient, advised per CY it is fine for him to take the nuvigil while driving as this was the purpose of the medication.  Pt stated that he understood this but his employer asked him to call to verify.  Pt aware that if any additional documentation is needed that we will be happy to provide it for him.  Nothing further needed; will sign off.

## 2012-10-23 NOTE — Telephone Encounter (Signed)
[email or web address].Jennifer Castillo, CMA  

## 2012-10-30 ENCOUNTER — Ambulatory Visit (HOSPITAL_BASED_OUTPATIENT_CLINIC_OR_DEPARTMENT_OTHER): Payer: 59 | Attending: Internal Medicine | Admitting: Radiology

## 2012-10-30 VITALS — Ht 69.0 in

## 2012-10-30 DIAGNOSIS — G4733 Obstructive sleep apnea (adult) (pediatric): Secondary | ICD-10-CM | POA: Insufficient documentation

## 2012-10-30 DIAGNOSIS — G47419 Narcolepsy without cataplexy: Secondary | ICD-10-CM

## 2012-10-30 NOTE — Assessment & Plan Note (Signed)
He is describing cataplexy, sleep paralysis, hypnagogic hallucination, with daytime sleepiness not adequately relieved by compliant use of CPAP. 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 Plan-schedule multiple sleep latency test/ MSLT as discussed. Emphasis on his responsibility to drive safely.

## 2012-10-30 NOTE — Assessment & Plan Note (Signed)
Flonase is effective and is refilled.

## 2012-10-30 NOTE — Assessment & Plan Note (Signed)
CPAP 13/ [employer] with good compliance. Control is good by description but with residual daytime sleepiness and associated symptoms strongly suggestive of narcolepsy. He will continue CPAP

## 2012-11-02 DIAGNOSIS — G47419 Narcolepsy without cataplexy: Secondary | ICD-10-CM

## 2012-11-02 DIAGNOSIS — G4733 Obstructive sleep apnea (adult) (pediatric): Secondary | ICD-10-CM

## 2012-11-02 NOTE — Procedures (Signed)
NAME:  [name], [name] NO.:  [id number]  MEDICAL RECORD NO.:  [id number]          PATIENT TYPE:  OUT  LOCATION:  SLEEP CENTER                 FACILITY:  [facility]  PHYSICIAN:  Kelsa Jaworowski D. Maple Hudson, MD, FCCP, FACPDATE OF BIRTH:  04-19-1971  DATE OF STUDY:  10/30/2012                         MULTIPLE SLEEP LATENCY TEST  REFERRING PHYSICIAN:  Analis Distler D. Hilde Churchman, MD, FCCP, FACP  INDICATION FOR STUDY:  Hypersomnia with narcolepsy.  This patient has known obstructive sleep apnea based on diagnostic and PSG 11/23/2000, recording AHI 20 per hour and treated with good compliance with CPAP at 13 CWP.  There is excessive residual daytime sleepiness.  EPWORTH SLEEPINESS SCORE:  BMI:  MEDICATIONS:  Charted and reviewed.  No medications were taken on the study date.  NAP 1:  8:00 a.m., sleep latency 0, REM latency 0.  NAP 2:  10:00 a.m., sleep latency 3.5 minutes.  REM latency, NA.  NAP 3:  Twelve noon, sleep latency 0.  REM latency 0.  NAP 4:  1400 p.m., sleep latency 0.  REM latency 2 minutes.  NAP 5:  1600 p.m.  Sleep latency 1 minute.  REM latency, NA.   MEAN SLEEP LATENCY:  0-0.9 minutes with REM on 3 naps.  NUMBER OF REM EPISODES:  COMMENTS:  IMPRESSIONS-RECOMMENDATIONS: 1. Mean sleep latency of 0.9 minutes indicates pathologic daytime     sleepiness.  This study was performed without medication following     a full night's sleep, wearing his standard CPAP at 13 CWP.  A mean     sleep latency of 5 minutes or less is considered pathologically     sleepy. 2. Sleep onset REM on 3 or 5  naps is consistent with narcolepsy     syndrome or relief from chronic REM suppression.     [name] D. Maple Hudson, MD, [facility], FACP Diplomate, American Board of Sleep Medicine    CDY/MEDQ  D:  11/02/2012 09:40:35  T:  11/02/2012 09:56:34  Job:  [id number]

## 2012-12-06 ENCOUNTER — Ambulatory Visit: Payer: Self-pay | Admitting: Critical Care Medicine

## 2013-01-14 ENCOUNTER — Ambulatory Visit (INDEPENDENT_AMBULATORY_CARE_PROVIDER_SITE_OTHER): Payer: 59 | Admitting: Critical Care Medicine

## 2013-01-14 ENCOUNTER — Encounter: Payer: Self-pay | Admitting: Internal Medicine

## 2013-01-14 ENCOUNTER — Ambulatory Visit (INDEPENDENT_AMBULATORY_CARE_PROVIDER_SITE_OTHER): Payer: 59 | Admitting: Internal Medicine

## 2013-01-14 ENCOUNTER — Encounter: Payer: Self-pay | Admitting: Critical Care Medicine

## 2013-01-14 VITALS — BP 120/68 | HR 91 | Ht 69.0 in | Wt 328.0 lb

## 2013-01-14 VITALS — BP 120/68 | HR 91 | Temp 98.2°F | Ht 69.0 in | Wt 328.2 lb

## 2013-01-14 DIAGNOSIS — F1410 Cocaine abuse, uncomplicated: Secondary | ICD-10-CM

## 2013-01-14 DIAGNOSIS — G4710 Hypersomnia, unspecified: Secondary | ICD-10-CM

## 2013-01-14 DIAGNOSIS — G4730 Sleep apnea, unspecified: Secondary | ICD-10-CM

## 2013-01-14 DIAGNOSIS — G4733 Obstructive sleep apnea (adult) (pediatric): Secondary | ICD-10-CM

## 2013-01-14 MED ORDER — METHYLPHENIDATE HCL ER 20 MG PO TBCR: EXTENDED_RELEASE_TABLET | ORAL | Status: DC

## 2013-01-14 NOTE — Patient Instructions (Addendum)
We can continue CPAP for your sleep apnea  For your problem of idiopathic hypersomnia we are using ritalin- Metadate ER 20 mg, used once or twice daily as needed  It is still best to take naps when you can do so safely.   Please call as needed

## 2013-01-14 NOTE — Progress Notes (Deleted)
  Subjective:    Patient ID: [name], male    DOB: 11/25/1970, 42 y.o.   MRN: [id number]  HPI    Review of Systems     Objective:   Physical Exam        Assessment & Plan:

## 2013-01-14 NOTE — Progress Notes (Signed)
01/25/12- 26 yoM former smoker comes to reestablish for management of obstructive sleep apnea complicated by history of allergic rhinitis and atrial fibrillation, diabetes, HTN. PCP- Dr Trula Slade NPSG 11/23/00- AHI 20 per hour. He continues using CPAP all night every night. 14 CWP/[employer]. Using a fullface mask but putting it on so only covers his nose,  otherwise it smothers him. Fighting daytime sleepiness which affects his work. Hypnagogic hallucination associated with dreaming. 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 Vivid dreams as soon as he falls asleep. Wakes irritable from naps. Bedtime 9:30 PM, very brief sleep latency, wakes 4 times for nocturia before up at 4:30 AM. No sleeping pill. 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  Quit smoking 2009, 1 beer/ month, little caffeine. Unmarried, living w/ sister, has girlfriend Fam hx- Father hx MI, Mother dcs'd DM  05/09/12- 28 yoM former smoker comes to reestablish for management of obstructive sleep apnea complicated by history of allergic rhinitis and atrial fibrillation, diabetes, HTN. PCP- Dr Trula Slade He returns to followup and reports use of CPAP 13/ [employer] most nights, all night " a whole 'nother world". He sleeps soundly and now with CPAP he is sleeping through his morning alarm clock which has caused him to miss work. Bedtime is 9:30 PM and he is trying to get up at 5:15 AM. Still drowsy after meals and if sitting quietly. He also has an urge to sleep if he gets angry and at that time recognizes dreaming. He does not specifically describe muscle weakness with these episodes. He gives a possible history of sleep paralysis.  10/21/12- 41 yoM former smoker followed for management of obstructive sleep apnea/ hypersomnia complicated by history of allergic rhinitis and atrial fibrillation, diabetes, HTN. PCP- Dr Trula Slade FOLLOWS FOR: using CPAP/ 13/ [employer] every night for 5 hours per night. denies any  problems with the machine or the mask. He is still fighting daytime sleepiness and falling asleep at work. He struggles with this because he has to drive. Strong emotion makes him feel weak so that he needs to sit down. He recognizes sleep paralysis as an occasional event as well as hypnagogic hallucination. Caffeine has little daytime effect. 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 Incidental request for referral to an eye doctor because of his diabetes. He would like to go back to Dr. Luciana Axe who had seen him in the past. Rhinitis is improved by Flonase, asks refill.  01/14/13-41 yoM former smoker followed for management of obstructive sleep apnea/ hypersomnia complicated by history of allergic rhinitis and atrial fibrillation, diabetes, HTN. PCP- Dr Trula Slade FOLLOWS FOR: Wears CPAP 13/ [employer] every night for about 6-8 hours; pressure working well for patient.  He still can get drowsy if he sits quietly. Caffeine has no effect. Nuvigil was no help. Multiple Sleep Latency Test 10/30/2012-pathologic daytime hypersomnia, nonspecific, compatible with idiopathic hypersomnia or narcolepsy. Mean latency 0.9 minutes with one sleep onset REM event. He has been taken off of driving at work, he says because of an issue with his blood sugar being too high.  ROS-see HPI Constitutional:   No-   weight loss, night sweats, fevers, chills,  +fatigue, lassitude. HEENT:   No-  headaches, difficulty swallowing, tooth/dental problems, sore throat,       No-  sneezing, itching, ear ache, nasal congestion, post nasal drip,  CV:  No-   chest pain, orthopnea, PND, swelling in  lower extremities, anasarca, dizziness, palpitations Resp: No-   shortness of breath with exertion or at rest.              No-   productive cough,  No non-productive cough,  No- coughing up of blood.              No-   change in color of mucus.  No- wheezing.   Skin: No-    rash or lesions. GI:  No-   heartburn, indigestion, abdominal pain, nausea, vomiting,  GU:  MS:  No-   joint pain or swelling.  Neuro-     nothing unusual Psych:  No- change in mood or affect. No depression or anxiety.  No memory loss.  OBJ- Physical Exam General- Alert, Oriented, Affect-appropriate, Distress- none acute, obese, intelligent Skin- rash-none, lesions- none, excoriation- none Lymphadenopathy- none Head- atraumatic            Eyes- Gross vision intact, PERRLA, conjunctivae and secretions clear            Ears- Hearing, canals-normal            Nose- rhinitis/ turbinate edema, no-Septal dev, mucus, polyps, erosion, perforation             Throat- Mallampati III , mucosa clear , drainage- none, tonsils- atrophic Neck- flexible , trachea midline, no stridor , thyroid nl, carotid no bruit Chest - symmetrical excursion , unlabored           Heart/CV- RRR , no murmur , no gallop  , no rub, nl s1 s2                           - JVD- none , edema- none, stasis changes- none, varices- none           Lung- clear to P&A, wheeze- none, cough- none , dullness-none, rub- none           Chest wall-  Abd-  Br/ Gen/ Rectal- Not done, not indicated Extrem- cyanosis- none, clubbing, none, atrophy- none, strength- nl Neuro- grossly intact to observation

## 2013-01-15 NOTE — Progress Notes (Signed)
Pt put on PW schedule in error Pt to see sleep MD dr young

## 2013-01-16 ENCOUNTER — Emergency Department (HOSPITAL_COMMUNITY): Payer: 59

## 2013-01-16 ENCOUNTER — Telehealth: Payer: Self-pay | Admitting: Internal Medicine

## 2013-01-16 ENCOUNTER — Emergency Department (HOSPITAL_COMMUNITY)
Admission: EM | Admit: 2013-01-16 | Discharge: 2013-01-17 | Disposition: A | Discharge status: Home / Self Care | Payer: 59 | Attending: Emergency Medicine | Admitting: Emergency Medicine

## 2013-01-16 ENCOUNTER — Encounter (HOSPITAL_COMMUNITY): Payer: Self-pay | Admitting: *Deleted

## 2013-01-16 DIAGNOSIS — R5381 Other malaise: Secondary | ICD-10-CM | POA: Insufficient documentation

## 2013-01-16 DIAGNOSIS — E119 Type 2 diabetes mellitus without complications: Secondary | ICD-10-CM | POA: Insufficient documentation

## 2013-01-16 DIAGNOSIS — B349 Viral infection, unspecified: Secondary | ICD-10-CM

## 2013-01-16 DIAGNOSIS — R1110 Vomiting, unspecified: Secondary | ICD-10-CM | POA: Insufficient documentation

## 2013-01-16 DIAGNOSIS — R51 Headache: Secondary | ICD-10-CM | POA: Insufficient documentation

## 2013-01-16 DIAGNOSIS — R52 Pain, unspecified: Secondary | ICD-10-CM | POA: Insufficient documentation

## 2013-01-16 DIAGNOSIS — IMO0002 Reserved for concepts with insufficient information to code with codable children: Secondary | ICD-10-CM | POA: Insufficient documentation

## 2013-01-16 DIAGNOSIS — B9789 Other viral agents as the cause of diseases classified elsewhere: Secondary | ICD-10-CM | POA: Insufficient documentation

## 2013-01-16 DIAGNOSIS — I4891 Unspecified atrial fibrillation: Secondary | ICD-10-CM | POA: Insufficient documentation

## 2013-01-16 DIAGNOSIS — F17200 Nicotine dependence, unspecified, uncomplicated: Secondary | ICD-10-CM | POA: Insufficient documentation

## 2013-01-16 DIAGNOSIS — I10 Essential (primary) hypertension: Secondary | ICD-10-CM | POA: Insufficient documentation

## 2013-01-16 DIAGNOSIS — R509 Fever, unspecified: Secondary | ICD-10-CM | POA: Insufficient documentation

## 2013-01-16 DIAGNOSIS — Z79899 Other long term (current) drug therapy: Secondary | ICD-10-CM | POA: Insufficient documentation

## 2013-01-16 DIAGNOSIS — Z8669 Personal history of other diseases of the nervous system and sense organs: Secondary | ICD-10-CM | POA: Insufficient documentation

## 2013-01-16 DIAGNOSIS — R0789 Other chest pain: Secondary | ICD-10-CM | POA: Insufficient documentation

## 2013-01-16 DIAGNOSIS — Z794 Long term (current) use of insulin: Secondary | ICD-10-CM | POA: Insufficient documentation

## 2013-01-16 LAB — CBC WITH DIFFERENTIAL/PLATELET
Basophils Absolute: 0 10*3/uL (ref 0.0–0.1)
Basophils Relative: 0 % (ref 0–1)
Eosinophils Absolute: 0.8 10*3/uL — ABNORMAL HIGH (ref 0.0–0.7)
MCH: 28.1 pg (ref 26.0–34.0)
MCHC: 34.8 g/dL (ref 30.0–36.0)
Neutrophils Relative %: 71 % (ref 43–77)
Platelets: 292 10*3/uL (ref 150–400)
RBC: 5.23 MIL/uL (ref 4.22–5.81)
RDW: 13.3 % (ref 11.5–15.5)

## 2013-01-16 LAB — BASIC METABOLIC PANEL
GFR calc Af Amer: >90 mL/min (ref >90)
GFR calc non Af Amer: >90 mL/min (ref >90)
Potassium: 3.8 mEq/L (ref 3.5–5.1)
Sodium: 135 mEq/L (ref 135–145)

## 2013-01-16 LAB — CK TOTAL AND CKMB (NOT AT ARMC)
CK, MB: 1.2 ng/mL (ref 0.3–4.0)
Relative Index: 1.2 (ref 0.0–2.5)

## 2013-01-16 IMAGING — CR DG CHEST 2V
2 series · 2 of 2 positions shown · non-contrast
Comparison: [DATE]

CLINICAL DATA: Weakness and fever

CHEST - 2 VIEW

[w chest pa]
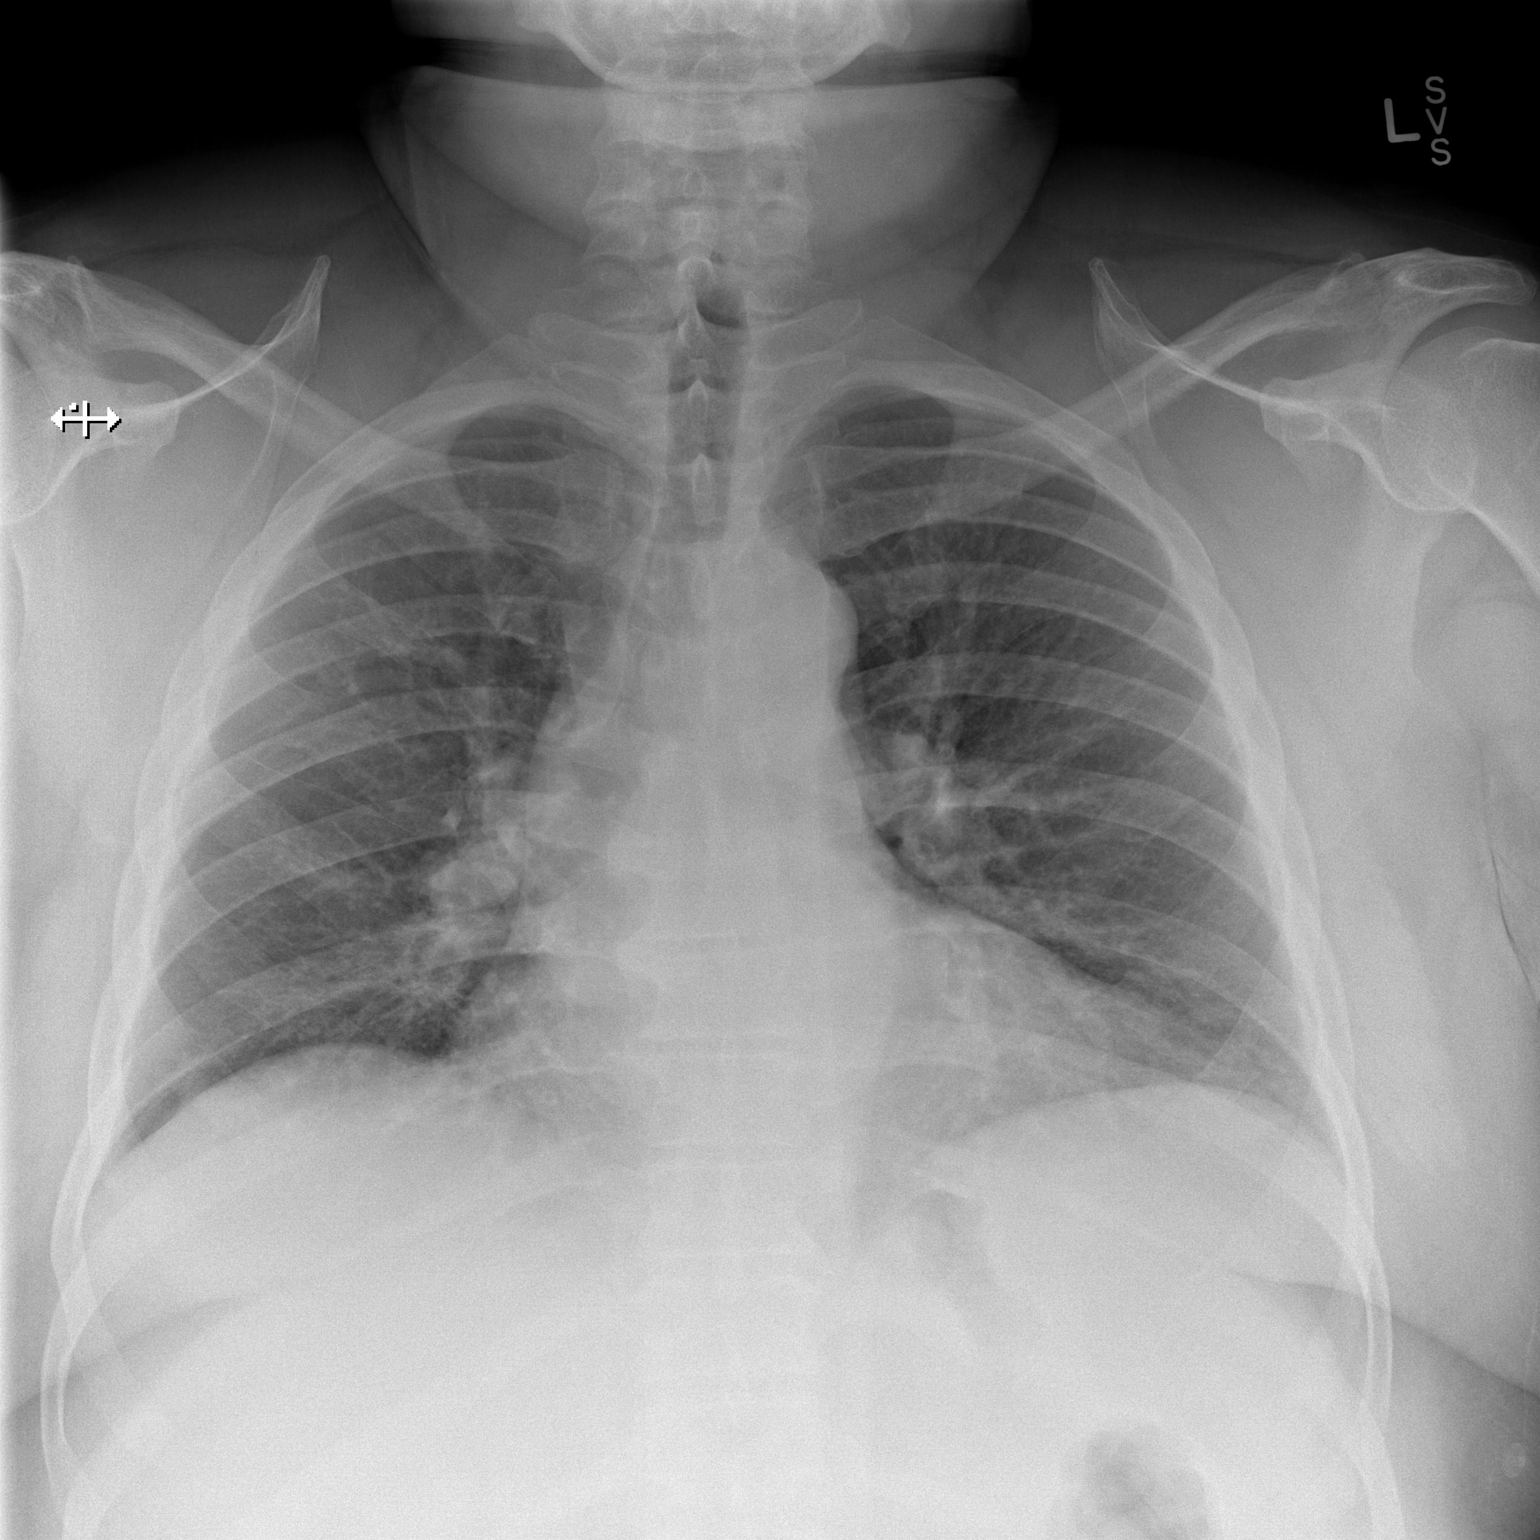

[w chest lat]
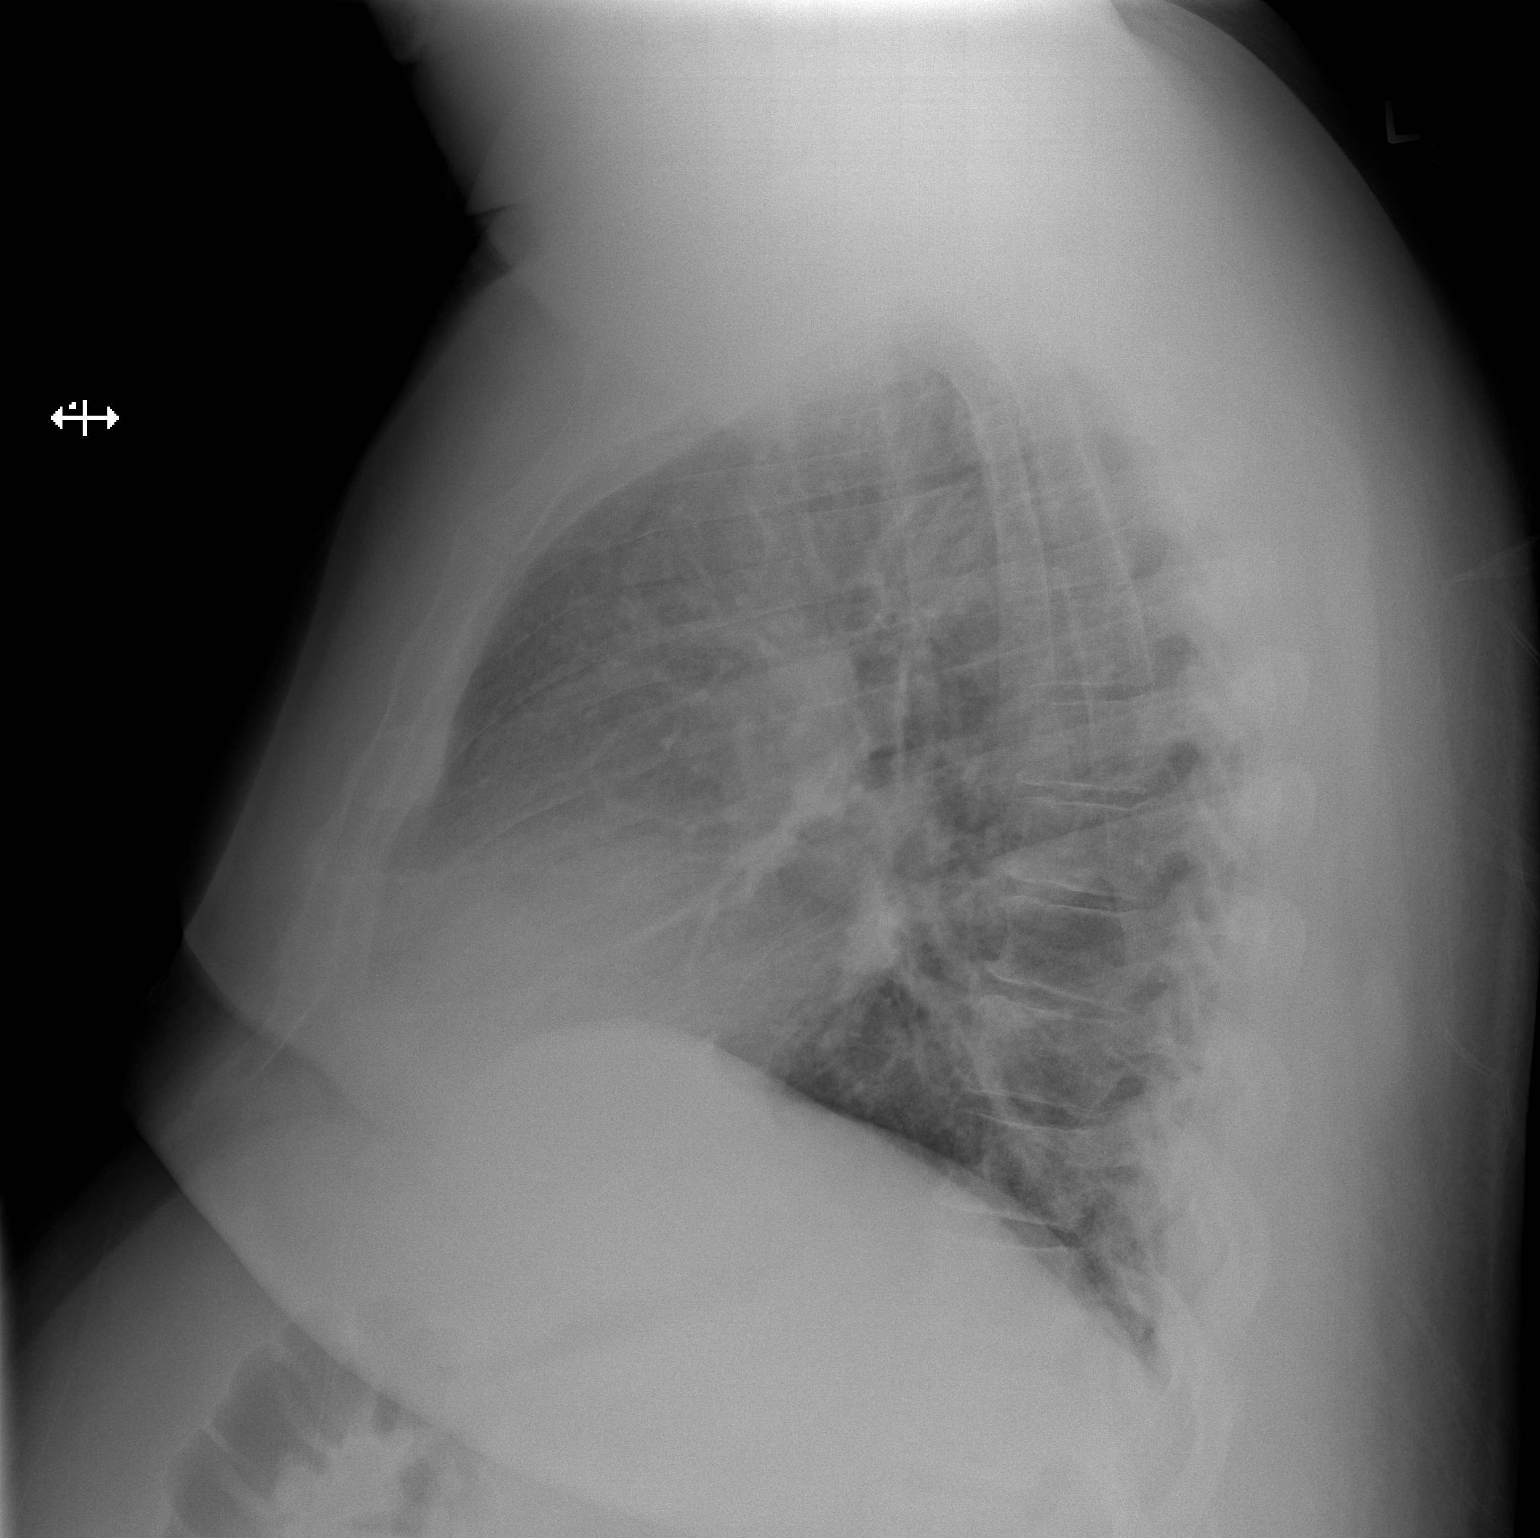

[2 of 2 positions shown; findings below may reference images not displayed]

FINDINGS: Low lung volumes.  Basilar hypoaeration change and upper
normal heart size.  Upper lungs clear.  No pneumothorax or pleural
effusion.
IMPRESSION: Bibasilar atelectasis.

## 2013-01-16 NOTE — ED Notes (Signed)
Pt c/o weak; nauseated; vomited today; headache; body soreness; tightness in chest x 1 day-comes and goes

## 2013-01-16 NOTE — Telephone Encounter (Signed)
(  continued) Does CY agree with this? If so will he please write a letter saying his restrictions.

## 2013-01-16 NOTE — Telephone Encounter (Signed)
Pt phone is D/C. Why is pt put on restrictions? Will await his call back tomorrow.

## 2013-01-17 ENCOUNTER — Encounter: Payer: Self-pay | Admitting: Internal Medicine

## 2013-01-17 LAB — RAPID URINE DRUG SCREEN, HOSP PERFORMED
Amphetamines: NOT DETECTED
Barbiturates: NOT DETECTED
Cocaine: NOT DETECTED
Tetrahydrocannabinol: NOT DETECTED

## 2013-01-17 LAB — URINALYSIS, ROUTINE W REFLEX MICROSCOPIC
Bilirubin Urine: NEGATIVE
Glucose, UA: NEGATIVE mg/dL
Hgb urine dipstick: NEGATIVE
Protein, ur: NEGATIVE mg/dL
Urobilinogen, UA: 0.2 mg/dL (ref 0.0–1.0)

## 2013-01-17 MED ORDER — ACETAMINOPHEN 325 MG PO TABS
650.0000 mg | ORAL_TABLET | Freq: Once | ORAL | Status: AC
  Administered 2013-01-17: 650 mg via ORAL
  Filled 2013-01-17: qty 2

## 2013-01-17 NOTE — Telephone Encounter (Signed)
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 Patient states he needs letter w Dr. Roxy Cedar thoughts on this, does patient have limitations/restrictions with this diagnosis for work purposes? Dr. Maple Hudson please advise, and if letter can be written for patient.  Return call on patients contact number, phone is now working.  Last OV: 01/14/13 Next OV: 03/03/13

## 2013-01-17 NOTE — Telephone Encounter (Signed)
Awaiting return call

## 2013-01-17 NOTE — Telephone Encounter (Signed)
Letter done

## 2013-01-17 NOTE — ED Provider Notes (Signed)
History     CSN: [id number]  Arrival date & time 01/16/13  2015   First MD Initiated Contact with Patient 01/16/13 2226      Chief Complaint  Patient presents with  . Weakness  . Fever    (Consider location/radiation/quality/duration/timing/severity/associated sxs/prior treatment) HPI Comments: The patient presents emergency department with the chief complaint of feeling weak and having one episode of vomiting. Patient states that his symptoms began this morning, after he took a Ritalin. Patient states that he recently started taking Ritalin. He believes that his symptoms are associated with this. He also endorses mild headache, which came on gradually, and generalized body aches. He states that he is feeling pretty well now. He has not had any additional episodes of vomiting. He denies seeing any blood in his vomit.  Also complains of some tightness in his chest, which has been intermittent.  There is no exertional component.  He denies any cough or SOB.  The history is provided by the patient. No language interpreter was used.    Past Medical History  Diagnosis Date  . Hypertension   . Diabetes mellitus   . Sleep apnea   . Overweight     Past Surgical History  Procedure Laterality Date  . Appendectomy    . Joint replacement    . Rotator cuff repair    . Tonsillectomy      Family History  Problem Relation Age of Onset  . Heart attack Father   . Diabetes Mother     History  Substance Use Topics  . Smoking status: Current Some Day Smoker -- 0.10 packs/day for 10 years    Last Attempt to Quit: 10/17/2007  . Smokeless tobacco: Current User    Types: Snuff     Comment: smokes one pack per month  . Alcohol Use: Yes     Comment: occasionally      Review of Systems  All other systems reviewed and are negative.    Allergies  Shrimp; Sulfa antibiotics; and Watermelon flavor  Home Medications   Current Outpatient Rx  Name  Route  Sig  Dispense  Refill  .  diltiazem (CARDIZEM CD) 360 MG 24 hr capsule   Oral   Take 360 mg by mouth daily.         Marland Kitchen doxycycline (VIBRAMYCIN) 100 MG capsule   Oral   Take 100 mg by mouth 2 (two) times daily.         . fluticasone (FLONASE) 50 MCG/ACT nasal spray      1-2 sprays each nostril twice daily every day   16 g   prn   . glipiZIDE (GLUCOTROL) 10 MG 24 hr tablet   Oral   Take 10 mg by mouth Twice daily.         Marland Kitchen LANTUS SOLOSTAR 100 UNIT/ML injection      30 units qd         . lisinopril-hydrochlorothiazide (PRINZIDE,ZESTORETIC) 10-12.5 MG per tablet   Oral   Take 1 tablet by mouth daily.         . metFORMIN (GLUCOPHAGE) 500 MG tablet   Oral   Take 1,000 mg by mouth 2 (two) times daily.          . methylphenidate (METADATE ER) 20 MG ER tablet   Oral   Take 20 mg by mouth daily as needed (ADD). 1 or 2 daily as needed           BP 137/79  Pulse  96  Temp(Src) 100.3 F (37.9 C) (Oral)  Resp 20  SpO2 97%  Physical Exam  Nursing note and vitals reviewed. Constitutional: He is oriented to person, place, and time. He appears well-developed and well-nourished.  HENT:  Head: Normocephalic and atraumatic.  Eyes: Conjunctivae and EOM are normal. Pupils are equal, round, and reactive to light. Right eye exhibits no discharge. Left eye exhibits no discharge. No scleral icterus.  Neck: Normal range of motion. Neck supple. No JVD present.  Cardiovascular: Normal rate, regular rhythm, normal heart sounds and intact distal pulses.  Exam reveals no gallop and no friction rub.   No murmur heard. Pulmonary/Chest: Effort normal and breath sounds normal. No respiratory distress. He has no wheezes. He has no rales. He exhibits no tenderness.  Abdominal: Soft. Bowel sounds are normal. He exhibits no distension and no mass. There is no tenderness. There is no rebound and no guarding.  Musculoskeletal: Normal range of motion. He exhibits no edema and no tenderness.  Neurological: He is alert  and oriented to person, place, and time.  CN 3-12 intact  Skin: Skin is warm and dry.  Psychiatric: He has a normal mood and affect. His behavior is normal. Judgment and thought content normal.    ED Course  Procedures (including critical care time)  Labs Reviewed  CBC WITH DIFFERENTIAL - Abnormal; Notable for the following:    Lymphocytes Relative 11 (*)    Monocytes Absolute 1.1 (*)    Eosinophils Relative 8 (*)    Eosinophils Absolute 0.8 (*)    All other components within normal limits  BASIC METABOLIC PANEL - Abnormal; Notable for the following:    Glucose, Bld 126 (*)    All other components within normal limits  CK TOTAL AND CKMB  TROPONIN I  URINALYSIS, ROUTINE W REFLEX MICROSCOPIC  URINE RAPID DRUG SCREEN (HOSP PERFORMED)   Results for orders placed during the hospital encounter of 01/16/13  CBC WITH DIFFERENTIAL      Result Value Range   WBC 10.4  4.0 - 10.5 K/uL   RBC 5.23  4.22 - 5.81 MIL/uL   Hemoglobin 14.7  13.0 - 17.0 g/dL   HCT 96.0  45.4 - 09.8 %   MCV 80.9  78.0 - 100.0 fL   MCH 28.1  26.0 - 34.0 pg   MCHC 34.8  30.0 - 36.0 g/dL   RDW 11.9  14.7 - 82.9 %   Platelets 292  150 - 400 K/uL   Neutrophils Relative 71  43 - 77 %   Neutro Abs 7.4  1.7 - 7.7 K/uL   Lymphocytes Relative 11 (*) 12 - 46 %   Lymphs Abs 1.2  0.7 - 4.0 K/uL   Monocytes Relative 10  3 - 12 %   Monocytes Absolute 1.1 (*) 0.1 - 1.0 K/uL   Eosinophils Relative 8 (*) 0 - 5 %   Eosinophils Absolute 0.8 (*) 0.0 - 0.7 K/uL   Basophils Relative 0  0 - 1 %   Basophils Absolute 0.0  0.0 - 0.1 K/uL  BASIC METABOLIC PANEL      Result Value Range   Sodium 135  135 - 145 mEq/L   Potassium 3.8  3.5 - 5.1 mEq/L   Chloride 98  96 - 112 mEq/L   CO2 27  19 - 32 mEq/L   Glucose, Bld 126 (*) 70 - 99 mg/dL   BUN 9  6 - 23 mg/dL   Creatinine, Ser 5.62  0.50 -  1.35 mg/dL   Calcium 9.0  8.4 - 96.0 mg/dL   GFR calc non Af Amer >90  >90 mL/min   GFR calc Af Amer >90  >90 mL/min  CK TOTAL AND CKMB       Result Value Range   Total CK 104  7 - 232 U/L   CK, MB 1.2  0.3 - 4.0 ng/mL   Relative Index 1.2  0.0 - 2.5  TROPONIN I      Result Value Range   Troponin I <0.30  <0.30 ng/mL  URINALYSIS, ROUTINE W REFLEX MICROSCOPIC      Result Value Range   Color, Urine YELLOW  YELLOW   APPearance CLEAR  CLEAR   Specific Gravity, Urine 1.023  1.005 - 1.030   pH 5.0  5.0 - 8.0   Glucose, UA NEGATIVE  NEGATIVE mg/dL   Hgb urine dipstick NEGATIVE  NEGATIVE   Bilirubin Urine NEGATIVE  NEGATIVE   Ketones, ur NEGATIVE  NEGATIVE mg/dL   Protein, ur NEGATIVE  NEGATIVE mg/dL   Urobilinogen, UA 0.2  0.0 - 1.0 mg/dL   Nitrite NEGATIVE  NEGATIVE   Leukocytes, UA NEGATIVE  NEGATIVE  URINE RAPID DRUG SCREEN (HOSP PERFORMED)      Result Value Range   Opiates NONE DETECTED  NONE DETECTED   Cocaine NONE DETECTED  NONE DETECTED   Benzodiazepines NONE DETECTED  NONE DETECTED   Amphetamines NONE DETECTED  NONE DETECTED   Tetrahydrocannabinol NONE DETECTED  NONE DETECTED   Barbiturates NONE DETECTED  NONE DETECTED   Dg Chest 2 View  01/16/2013  *RADIOLOGY REPORT*  Clinical Data: Weakness and fever  CHEST - 2 VIEW  Comparison: 08/30/2010  Findings: Low lung volumes.  Basilar hypoaeration change and upper normal heart size.  Upper lungs clear.  No pneumothorax or pleural effusion.  IMPRESSION: Bibasilar atelectasis.   Original Report Authenticated By: Jolaine Click, [name]      Dg Chest 2 View  01/16/2013  *RADIOLOGY REPORT*  Clinical Data: Weakness and fever  CHEST - 2 VIEW  Comparison: 08/30/2010  Findings: Low lung volumes.  Basilar hypoaeration change and upper normal heart size.  Upper lungs clear.  No pneumothorax or pleural effusion.  IMPRESSION: Bibasilar atelectasis.   Original Report Authenticated By: Jolaine Click, [name]      1. Viral illness       MDM  Patient with vague complaints and one episode of vomiting.  Patient believes his symptoms are due to his new ritalin medication.  No evidence of acute  abdomen.  Patient is not in any distress.  He is well appearing. His labs are reassuring.  Plan is to dc the patient to home with PCP follow up. Discussed the patient with Dr. Denton Lank, who agrees with the plan.        Roxy Horseman, PA-C 01/17/13 [phone]

## 2013-01-17 NOTE — ED Notes (Signed)
Pt completed fluid challenge without difficulty

## 2013-01-20 ENCOUNTER — Emergency Department (HOSPITAL_COMMUNITY): Payer: 59

## 2013-01-20 ENCOUNTER — Encounter (HOSPITAL_COMMUNITY): Payer: Self-pay | Admitting: *Deleted

## 2013-01-20 ENCOUNTER — Inpatient Hospital Stay (HOSPITAL_COMMUNITY): Payer: 59

## 2013-01-20 ENCOUNTER — Inpatient Hospital Stay (HOSPITAL_COMMUNITY)
Admission: EM | Admit: 2013-01-20 | Discharge: 2013-01-22 | DRG: 308 | Disposition: A | Discharge status: Home / Self Care | Payer: 59 | Attending: Internal Medicine | Admitting: Internal Medicine

## 2013-01-20 DIAGNOSIS — M25511 Pain in right shoulder: Secondary | ICD-10-CM

## 2013-01-20 DIAGNOSIS — Z91013 Allergy to seafood: Secondary | ICD-10-CM

## 2013-01-20 DIAGNOSIS — G4710 Hypersomnia, unspecified: Secondary | ICD-10-CM

## 2013-01-20 DIAGNOSIS — R195 Other fecal abnormalities: Secondary | ICD-10-CM

## 2013-01-20 DIAGNOSIS — Z794 Long term (current) use of insulin: Secondary | ICD-10-CM

## 2013-01-20 DIAGNOSIS — G4733 Obstructive sleep apnea (adult) (pediatric): Secondary | ICD-10-CM | POA: Diagnosis present

## 2013-01-20 DIAGNOSIS — E785 Hyperlipidemia, unspecified: Secondary | ICD-10-CM

## 2013-01-20 DIAGNOSIS — G4712 Idiopathic hypersomnia without long sleep time: Secondary | ICD-10-CM | POA: Diagnosis present

## 2013-01-20 DIAGNOSIS — J302 Other seasonal allergic rhinitis: Secondary | ICD-10-CM

## 2013-01-20 DIAGNOSIS — Z91018 Allergy to other foods: Secondary | ICD-10-CM

## 2013-01-20 DIAGNOSIS — Z9119 Patient's noncompliance with other medical treatment and regimen: Secondary | ICD-10-CM

## 2013-01-20 DIAGNOSIS — R0609 Other forms of dyspnea: Secondary | ICD-10-CM | POA: Diagnosis present

## 2013-01-20 DIAGNOSIS — I517 Cardiomegaly: Secondary | ICD-10-CM

## 2013-01-20 DIAGNOSIS — Z833 Family history of diabetes mellitus: Secondary | ICD-10-CM

## 2013-01-20 DIAGNOSIS — K921 Melena: Secondary | ICD-10-CM

## 2013-01-20 DIAGNOSIS — R0989 Other specified symptoms and signs involving the circulatory and respiratory systems: Secondary | ICD-10-CM | POA: Diagnosis present

## 2013-01-20 DIAGNOSIS — Z8679 Personal history of other diseases of the circulatory system: Secondary | ICD-10-CM

## 2013-01-20 DIAGNOSIS — Z9660 Presence of unspecified orthopedic joint implant: Secondary | ICD-10-CM

## 2013-01-20 DIAGNOSIS — L02219 Cutaneous abscess of trunk, unspecified: Secondary | ICD-10-CM | POA: Diagnosis present

## 2013-01-20 DIAGNOSIS — J189 Pneumonia, unspecified organism: Secondary | ICD-10-CM

## 2013-01-20 DIAGNOSIS — F17200 Nicotine dependence, unspecified, uncomplicated: Secondary | ICD-10-CM | POA: Diagnosis present

## 2013-01-20 DIAGNOSIS — F1410 Cocaine abuse, uncomplicated: Secondary | ICD-10-CM

## 2013-01-20 DIAGNOSIS — M25569 Pain in unspecified knee: Secondary | ICD-10-CM | POA: Diagnosis present

## 2013-01-20 DIAGNOSIS — E119 Type 2 diabetes mellitus without complications: Secondary | ICD-10-CM | POA: Diagnosis present

## 2013-01-20 DIAGNOSIS — J3489 Other specified disorders of nose and nasal sinuses: Secondary | ICD-10-CM | POA: Diagnosis present

## 2013-01-20 DIAGNOSIS — Z882 Allergy status to sulfonamides status: Secondary | ICD-10-CM

## 2013-01-20 DIAGNOSIS — J3089 Other allergic rhinitis: Secondary | ICD-10-CM

## 2013-01-20 DIAGNOSIS — L03319 Cellulitis of trunk, unspecified: Secondary | ICD-10-CM | POA: Diagnosis present

## 2013-01-20 DIAGNOSIS — I5032 Chronic diastolic (congestive) heart failure: Secondary | ICD-10-CM | POA: Diagnosis present

## 2013-01-20 DIAGNOSIS — Z91199 Patient's noncompliance with other medical treatment and regimen due to unspecified reason: Secondary | ICD-10-CM

## 2013-01-20 DIAGNOSIS — I4891 Unspecified atrial fibrillation: Principal | ICD-10-CM

## 2013-01-20 DIAGNOSIS — Z9089 Acquired absence of other organs: Secondary | ICD-10-CM

## 2013-01-20 DIAGNOSIS — Z6841 Body Mass Index (BMI) 40.0 and over, adult: Secondary | ICD-10-CM

## 2013-01-20 DIAGNOSIS — E871 Hypo-osmolality and hyponatremia: Secondary | ICD-10-CM | POA: Diagnosis present

## 2013-01-20 DIAGNOSIS — I10 Essential (primary) hypertension: Secondary | ICD-10-CM | POA: Diagnosis present

## 2013-01-20 DIAGNOSIS — Z8249 Family history of ischemic heart disease and other diseases of the circulatory system: Secondary | ICD-10-CM

## 2013-01-20 HISTORY — DX: Paroxysmal atrial fibrillation: I48.0

## 2013-01-20 HISTORY — DX: Alcohol abuse, in remission: F10.11

## 2013-01-20 HISTORY — DX: Cocaine use, unspecified, in remission: F14.91

## 2013-01-20 HISTORY — DX: Personal history of other specified conditions: Z87.898

## 2013-01-20 HISTORY — DX: Morbid (severe) obesity due to excess calories: E66.01

## 2013-01-20 LAB — CBC WITH DIFFERENTIAL/PLATELET
Basophils Absolute: 0.1 10*3/uL (ref 0.0–0.1)
Basophils Relative: 1 % (ref 0–1)
Eosinophils Absolute: 0.2 10*3/uL (ref 0.0–0.7)
Hemoglobin: 15.8 g/dL (ref 13.0–17.0)
Lymphocytes Relative: 15 % (ref 12–46)
MCHC: 36.5 g/dL — ABNORMAL HIGH (ref 30.0–36.0)
Monocytes Absolute: 1.6 10*3/uL — ABNORMAL HIGH (ref 0.1–1.0)
Neutrophils Relative %: 65 % (ref 43–77)
Platelets: 281 10*3/uL (ref 150–400)
RBC: 5.49 MIL/uL (ref 4.22–5.81)

## 2013-01-20 LAB — COMPREHENSIVE METABOLIC PANEL
ALT: 47 U/L (ref 0–53)
AST: 33 U/L (ref 0–37)
Albumin: 3.3 g/dL — ABNORMAL LOW (ref 3.5–5.2)
Alkaline Phosphatase: 83 U/L (ref 39–117)
Calcium: 9 mg/dL (ref 8.4–10.5)
Glucose, Bld: 197 mg/dL — ABNORMAL HIGH (ref 70–99)
Potassium: 4.2 mEq/L (ref 3.5–5.1)
Sodium: 133 mEq/L — ABNORMAL LOW (ref 135–145)
Total Protein: 7.6 g/dL (ref 6.0–8.3)

## 2013-01-20 LAB — URINALYSIS, ROUTINE W REFLEX MICROSCOPIC
Glucose, UA: NEGATIVE mg/dL
Specific Gravity, Urine: 1.03 (ref 1.005–1.030)

## 2013-01-20 LAB — URINE MICROSCOPIC-ADD ON

## 2013-01-20 LAB — MRSA PCR SCREENING: MRSA by PCR: NEGATIVE

## 2013-01-20 IMAGING — CR DG KNEE 1-2V*R*
2 series · 2 of 2 positions shown · non-contrast
Comparison: None.

CLINICAL DATA: Right knee pain.

RIGHT KNEE - 1-2 VIEW

[t knee ap right]
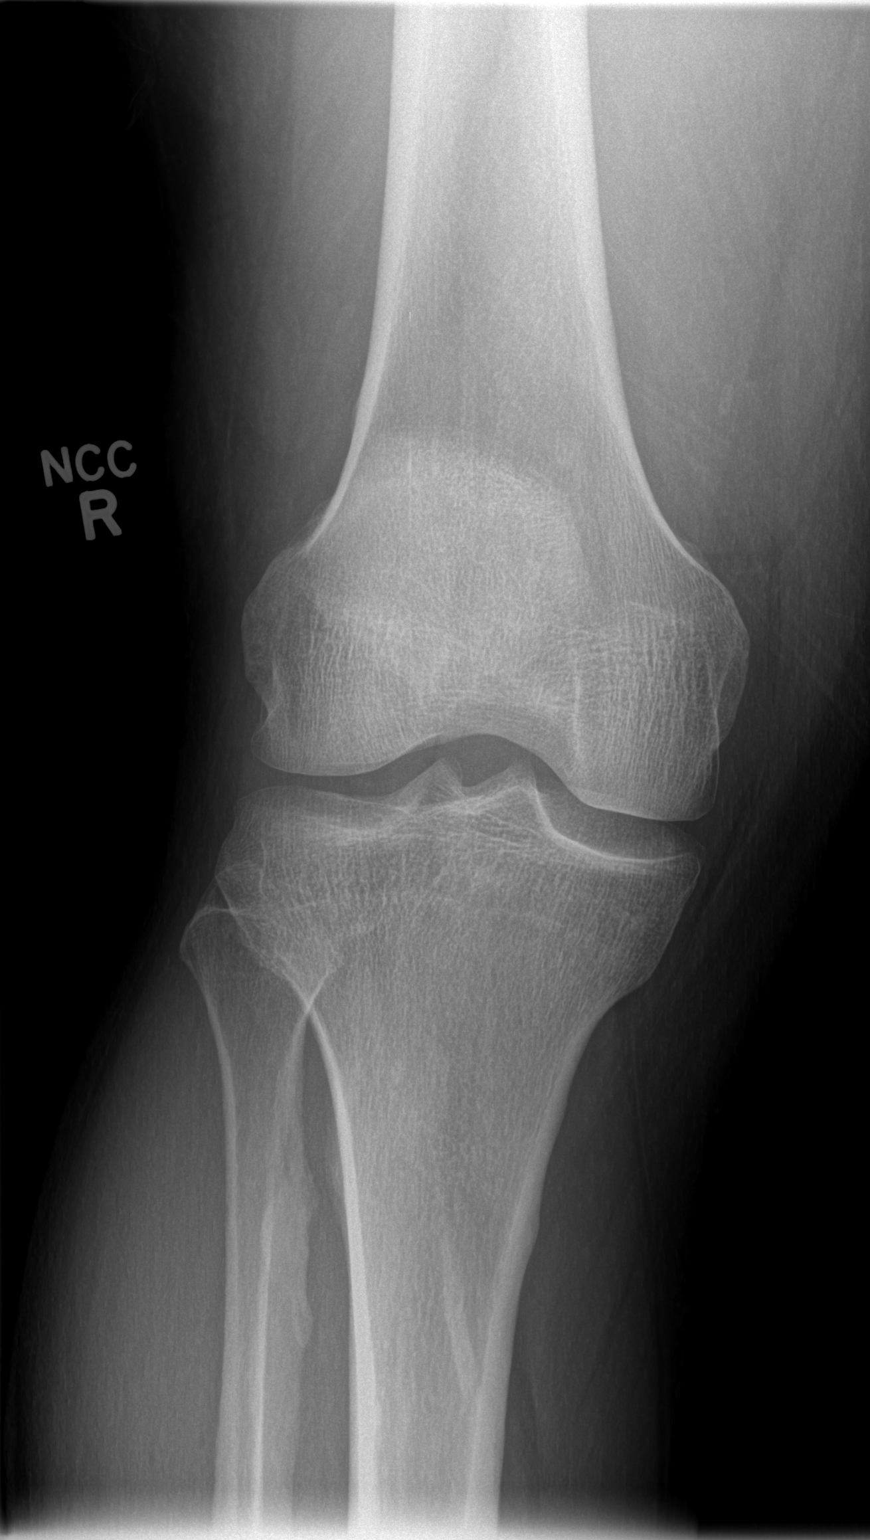

[t knee lat right]
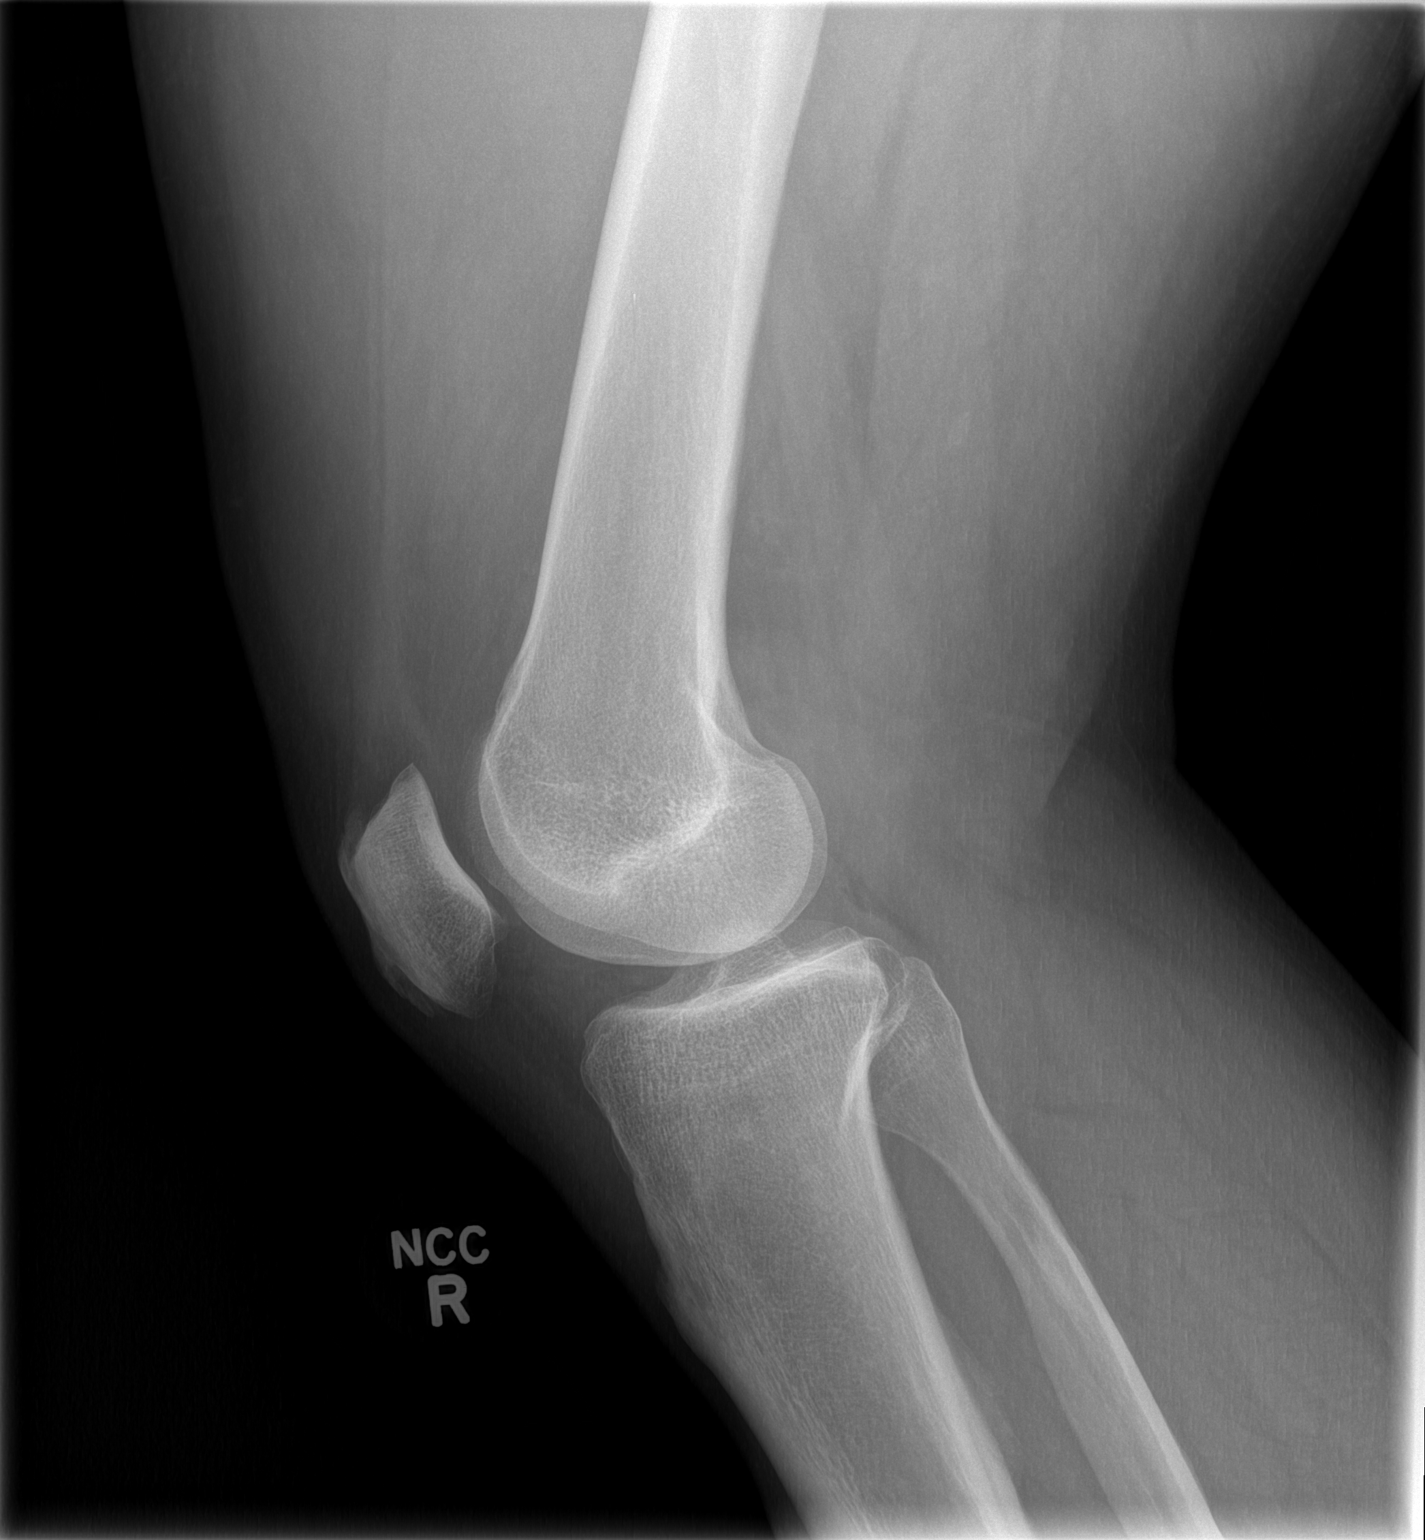

[2 of 2 positions shown; findings below may reference images not displayed]

FINDINGS: There is no evidence of fracture or dislocation.  The
joint spaces are preserved.  No significant degenerative change is
seen; the patellofemoral joint is grossly unremarkable in
appearance.

No significant joint effusion is seen.  The visualized soft tissues
are normal in appearance.
IMPRESSION: No evidence of fracture or dislocation.

## 2013-01-20 IMAGING — CR DG CHEST 1V PORT
1 series · 1 of 1 positions shown · non-contrast
Comparison: Chest radiograph performed [DATE]

CLINICAL DATA: Shortness of breath.

PORTABLE CHEST - 1 VIEW

[AP]
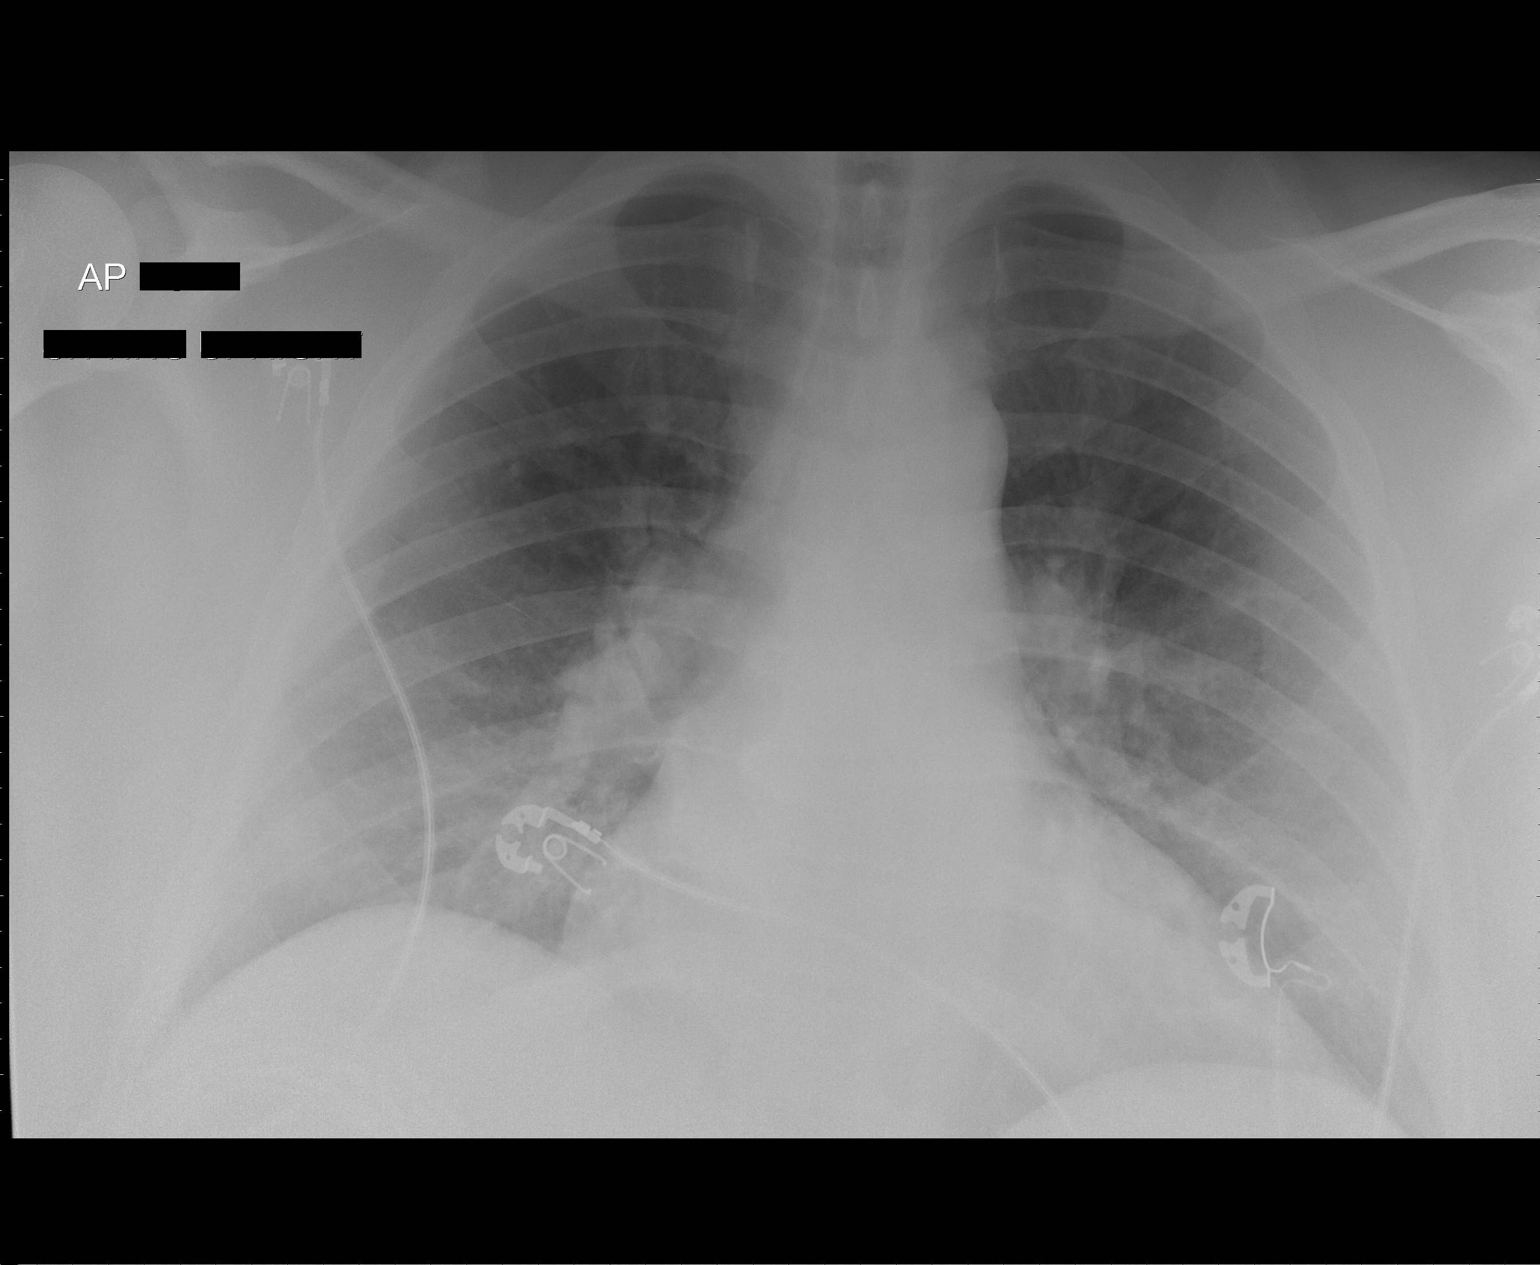

[1 of 1 positions shown; findings below may reference images not displayed]

FINDINGS: The lungs are well-aerated.  Pulmonary vascularity is at
the upper limits of normal.  Mild bibasilar opacities may reflect
atelectasis or possibly mild pneumonia.  There is no evidence of
pleural effusion or pneumothorax.

The cardiomediastinal silhouette is borderline enlarged.  No acute
osseous abnormalities are seen.
IMPRESSION: Mild bibasilar opacities may reflect atelectasis or possibly mild
pneumonia.  Borderline cardiomegaly noted.

## 2013-01-20 MED ORDER — FLUTICASONE PROPIONATE 50 MCG/ACT NA SUSP
1.0000 | Freq: Every day | NASAL | Status: DC
  Administered 2013-01-20 – 2013-01-22 (×3): 1 via NASAL
  Filled 2013-01-20: qty 16

## 2013-01-20 MED ORDER — SODIUM CHLORIDE 0.9 % IV BOLUS (SEPSIS)
1000.0000 mL | Freq: Once | INTRAVENOUS | Status: AC
  Administered 2013-01-20: 1000 mL via INTRAVENOUS

## 2013-01-20 MED ORDER — ASPIRIN 325 MG PO TABS
325.0000 mg | ORAL_TABLET | Freq: Every day | ORAL | Status: DC
  Administered 2013-01-20: 325 mg via ORAL
  Filled 2013-01-20: qty 1

## 2013-01-20 MED ORDER — METHYLPHENIDATE HCL ER 20 MG PO TBCR: 20.0000 mg | EXTENDED_RELEASE_TABLET | Freq: Every day | ORAL | Status: DC | PRN

## 2013-01-20 MED ORDER — HEPARIN (PORCINE) IN NACL 100-0.45 UNIT/ML-% IJ SOLN
2450.0000 [IU]/h | INTRAMUSCULAR | Status: DC
  Administered 2013-01-20: 1950 [IU]/h via INTRAVENOUS
  Administered 2013-01-20: 1500 [IU]/h via INTRAVENOUS
  Administered 2013-01-21: 1950 [IU]/h via INTRAVENOUS
  Filled 2013-01-20 (×6): qty 250

## 2013-01-20 MED ORDER — HEPARIN BOLUS VIA INFUSION
3000.0000 [IU] | Freq: Once | INTRAVENOUS | Status: AC
  Administered 2013-01-20: 3000 [IU] via INTRAVENOUS
  Filled 2013-01-20: qty 3000

## 2013-01-20 MED ORDER — DEXTROSE 5 % IV SOLN
5.0000 mg/h | INTRAVENOUS | Status: DC
  Administered 2013-01-20 – 2013-01-21 (×2): 5 mg/h via INTRAVENOUS

## 2013-01-20 MED ORDER — DILTIAZEM HCL 100 MG IV SOLR
5.0000 mg/h | Freq: Once | INTRAVENOUS | Status: AC
  Administered 2013-01-20: 10 mg/h via INTRAVENOUS

## 2013-01-20 MED ORDER — GLIPIZIDE ER 10 MG PO TB24
10.0000 mg | ORAL_TABLET | Freq: Every day | ORAL | Status: DC
  Administered 2013-01-21 – 2013-01-22 (×2): 10 mg via ORAL
  Filled 2013-01-20 (×3): qty 1

## 2013-01-20 MED ORDER — SODIUM CHLORIDE 0.9 % IJ SOLN
3.0000 mL | INTRAMUSCULAR | Status: DC | PRN
  Administered 2013-01-20: 3 mL via INTRAVENOUS

## 2013-01-20 MED ORDER — AZITHROMYCIN 500 MG PO TABS
500.0000 mg | ORAL_TABLET | Freq: Every day | ORAL | Status: DC
  Administered 2013-01-20 – 2013-01-22 (×3): 500 mg via ORAL
  Filled 2013-01-20 (×3): qty 1

## 2013-01-20 MED ORDER — INSULIN ASPART 100 UNIT/ML ~~LOC~~ SOLN
0.0000 [IU] | Freq: Every day | SUBCUTANEOUS | Status: DC
  Administered 2013-01-20: 2 [IU] via SUBCUTANEOUS

## 2013-01-20 MED ORDER — DEXTROSE 5 % IV SOLN
1.0000 g | Freq: Every day | INTRAVENOUS | Status: DC
  Administered 2013-01-20 – 2013-01-22 (×3): 1 g via INTRAVENOUS
  Filled 2013-01-20 (×3): qty 10

## 2013-01-20 MED ORDER — SODIUM CHLORIDE 0.9 % IJ SOLN
3.0000 mL | Freq: Two times a day (BID) | INTRAMUSCULAR | Status: DC
  Administered 2013-01-20 – 2013-01-22 (×3): 3 mL via INTRAVENOUS

## 2013-01-20 MED ORDER — ACETAMINOPHEN 325 MG PO TABS
650.0000 mg | ORAL_TABLET | Freq: Four times a day (QID) | ORAL | Status: DC | PRN
  Administered 2013-01-20 – 2013-01-21 (×4): 650 mg via ORAL
  Filled 2013-01-20 (×4): qty 2

## 2013-01-20 MED ORDER — ONDANSETRON HCL 4 MG/2ML IJ SOLN
4.0000 mg | Freq: Four times a day (QID) | INTRAMUSCULAR | Status: DC | PRN
  Administered 2013-01-20 – 2013-01-21 (×3): 4 mg via INTRAVENOUS
  Filled 2013-01-20 (×3): qty 2

## 2013-01-20 MED ORDER — INSULIN ASPART 100 UNIT/ML ~~LOC~~ SOLN
0.0000 [IU] | Freq: Three times a day (TID) | SUBCUTANEOUS | Status: DC
  Administered 2013-01-20: 3 [IU] via SUBCUTANEOUS
  Administered 2013-01-21: 2 [IU] via SUBCUTANEOUS
  Administered 2013-01-21: 3 [IU] via SUBCUTANEOUS
  Administered 2013-01-22: 2 [IU] via SUBCUTANEOUS

## 2013-01-20 MED ORDER — SODIUM CHLORIDE 0.9 % IV SOLN: INTRAVENOUS | Status: DC

## 2013-01-20 MED ORDER — INSULIN GLARGINE 100 UNIT/ML ~~LOC~~ SOLN
30.0000 [IU] | Freq: Every day | SUBCUTANEOUS | Status: DC
  Administered 2013-01-21 – 2013-01-22 (×2): 30 [IU] via SUBCUTANEOUS
  Filled 2013-01-20 (×3): qty 0.3

## 2013-01-20 MED ORDER — SODIUM CHLORIDE 0.9 % IJ SOLN
3.0000 mL | Freq: Two times a day (BID) | INTRAMUSCULAR | Status: DC
  Administered 2013-01-20 – 2013-01-22 (×4): 3 mL via INTRAVENOUS

## 2013-01-20 MED ORDER — ONDANSETRON HCL 4 MG/2ML IJ SOLN: 4.0000 mg | Freq: Three times a day (TID) | INTRAMUSCULAR | Status: DC | PRN

## 2013-01-20 MED ORDER — DEXTROSE 5 % IV SOLN
2.0000 g | Freq: Once | INTRAVENOUS | Status: AC
  Administered 2013-01-20: 2 g via INTRAVENOUS
  Filled 2013-01-20: qty 2

## 2013-01-20 MED ORDER — ACETAMINOPHEN 325 MG PO TABS
650.0000 mg | ORAL_TABLET | Freq: Four times a day (QID) | ORAL | Status: DC | PRN
  Administered 2013-01-20: 650 mg via ORAL
  Filled 2013-01-20: qty 2

## 2013-01-20 MED ORDER — LISINOPRIL 10 MG PO TABS
10.0000 mg | ORAL_TABLET | Freq: Every day | ORAL | Status: DC
  Administered 2013-01-21: 10 mg via ORAL
  Filled 2013-01-20: qty 1

## 2013-01-20 MED ORDER — ALBUTEROL SULFATE (5 MG/ML) 0.5% IN NEBU: 2.5000 mg | INHALATION_SOLUTION | RESPIRATORY_TRACT | Status: DC | PRN

## 2013-01-20 MED ORDER — DILTIAZEM HCL 50 MG/10ML IV SOLN
10.0000 mg | Freq: Once | INTRAVENOUS | Status: AC
  Administered 2013-01-20: 10 mg via INTRAVENOUS
  Filled 2013-01-20: qty 2

## 2013-01-20 MED ORDER — HYDROMORPHONE HCL PF 1 MG/ML IJ SOLN: 1.0000 mg | INTRAMUSCULAR | Status: DC | PRN

## 2013-01-20 MED ORDER — PNEUMOCOCCAL VAC POLYVALENT 25 MCG/0.5ML IJ INJ
0.5000 mL | INJECTION | INTRAMUSCULAR | Status: AC
  Filled 2013-01-20: qty 0.5

## 2013-01-20 MED ORDER — HEPARIN SODIUM (PORCINE) 5000 UNIT/ML IJ SOLN
INTRAMUSCULAR | Status: AC
  Administered 2013-01-20: 5000 [IU]
  Filled 2013-01-20: qty 1

## 2013-01-20 MED ORDER — ONDANSETRON HCL 4 MG PO TABS: 4.0000 mg | ORAL_TABLET | Freq: Four times a day (QID) | ORAL | Status: DC | PRN

## 2013-01-20 MED ORDER — ASPIRIN EC 81 MG PO TBEC
81.0000 mg | DELAYED_RELEASE_TABLET | Freq: Every day | ORAL | Status: DC
  Administered 2013-01-21: 81 mg via ORAL
  Filled 2013-01-20 (×2): qty 1

## 2013-01-20 MED ORDER — HEPARIN BOLUS VIA INFUSION
5000.0000 [IU] | Freq: Once | INTRAVENOUS | Status: AC
  Administered 2013-01-20: 5000 [IU] via INTRAVENOUS

## 2013-01-20 MED ORDER — SODIUM CHLORIDE 0.9 % IV SOLN
250.0000 mL | INTRAVENOUS | Status: DC | PRN
  Administered 2013-01-21: 250 mL via INTRAVENOUS

## 2013-01-20 MED ORDER — ASPIRIN 81 MG PO CHEW
81.0000 mg | CHEWABLE_TABLET | Freq: Every day | ORAL | Status: DC
  Administered 2013-01-20: 81 mg via ORAL
  Filled 2013-01-20: qty 1

## 2013-01-20 MED ORDER — DOXYCYCLINE HYCLATE 100 MG PO CAPS: 100.0000 mg | ORAL_CAPSULE | Freq: Two times a day (BID) | ORAL | Status: DC

## 2013-01-20 MED ORDER — SODIUM CHLORIDE 0.9 % IV SOLN: 250.0000 mL | INTRAVENOUS | Status: DC | PRN

## 2013-01-20 NOTE — ED Notes (Signed)
Pt alert, NAD, calm, interactive, skin W&D, resps even, labored (mild), heavy, LS CTA, upper airway sounds, no distress.

## 2013-01-20 NOTE — ED Notes (Signed)
Xray finished at [facility] 

## 2013-01-20 NOTE — ED Notes (Signed)
PA at the bedside with the Cardiology consult.

## 2013-01-20 NOTE — ED Notes (Signed)
pCXR to return for pCXR.

## 2013-01-20 NOTE — ED Notes (Signed)
Xray back at [facility].

## 2013-01-20 NOTE — Care Management Note (Signed)
    Page 1 of 1   01/20/2013     1:46:15 PM   CARE MANAGEMENT NOTE 01/20/2013  Patient:  [name], [name]   Account Number:  [id number]  Date Initiated:  01/20/2013  Documentation initiated by:  Junius Creamer  Subjective/Objective Assessment:   adm w at fib, pneumonia     Action/Plan:   lives alone, pcp dr Windy Fast polite   Anticipated DC Date:     Anticipated DC Plan:        DC Planning Services  CM consult      Choice offered to / List presented to:             Status of service:   Medicare Important Message given?   (If response is "NO", the following Medicare IM given date fields will be blank) Date Medicare IM given:   Date Additional Medicare IM given:    Discharge Disposition:    Per UR Regulation:  Reviewed for med. necessity/level of care/duration of stay  If discussed at Long Length of Stay Meetings, dates discussed:    Comments:  4/7 1345 debbie Aeson Sawyers rn,bsn

## 2013-01-20 NOTE — Assessment & Plan Note (Signed)
He says now he only tried it because someone told him it would help him be more alert.

## 2013-01-20 NOTE — ED Provider Notes (Addendum)
Old male noted onset yesterday of dyspnea which was worse with exertion. He is not aware of any palpitations and he denied any chest pain. He arrived in the ED and was noted to be in atrial fibrillation. He has a history of having been in atrial fibrillation in the past. He is on Ritalin and he is wondering if that was causing the atrial fibrillation. Her rate control. Lungs are clear and heart is mildly irregular with no murmur. He has no peripheral edema. Blood pressure has dropped with diltiazem he'll be given a fluid bolus and cardiology will be consulted regarding duration for cardioversion and decision regarding anticoagulation. It is concerning that he was not aware of his change in heart rhythm which places significant uncertainty as to the actual timing of when he went into atrial fibrillation.   Date: 01/20/2013  Rate: 154  Rhythm: atrial fibrillation  QRS Axis: normal  Intervals: normal  ST/T Wave abnormalities: nonspecific T wave changes  Conduction Disutrbances:none  Narrative Interpretation:  Atrial fibrillation with rapid ventricular response, non-specific t wave changes. No prior ECG available for comparison.  Old EKG Reviewed: none available     Date: 01/20/2013 08:24  Rate: 103  Rhythm: atrial fibrillation  QRS Axis: normal  Intervals: normal  ST/T Wave abnormalities: nonspecific T wave changes  Conduction Disutrbances:none  Narrative Interpretation: Atrial fibrillation with slightly rapid ventricular response, old anteroseptal myocardial infarction, nonspecific T-wave changes diffusely. Compared with ECG earlier today, rate has slowed.  Old EKG Reviewed: unchanged  Medical screening examination/treatment/procedure(s) were conducted as a shared visit with non-physician practitioner(s) and myself.  I personally evaluated the patient during the encounter   Dione Booze, MD 01/20/13 1025  CRITICAL CARE Performed by: Dione Booze   Total critical care time: 40  minutes  Critical care time was exclusive of separately billable procedures and treating other patients.  Critical care was necessary to treat or prevent imminent or life-threatening deterioration.  Critical care was time spent personally by me on the following activities: development of treatment plan with patient and/or surrogate as well as nursing, discussions with consultants, evaluation of patient's response to treatment, examination of patient, obtaining history from patient or surrogate, ordering and performing treatments and interventions, ordering and review of laboratory studies, ordering and review of radiographic studies, pulse oximetry and re-evaluation of patient's condition.   Dione Booze, MD 01/20/13 1225

## 2013-01-20 NOTE — Progress Notes (Signed)
ANTICOAGULATION CONSULT NOTE - Initial Consult  Pharmacy Consult:  Heparin Indication:  Afib  Allergies  Allergen Reactions  . Shrimp (Shellfish Allergy) Swelling  . Sulfa Antibiotics   . Watermelon Flavor Nausea And Vomiting    Patient Measurements: Height: 5' 8.9" (175 cm) Weight: 328 lb 0.7 oz (148.8 kg) IBW/kg (Calculated) : 70.47 Heparin Dosing Weight: 107 kg  Vital Signs: Temp: 100.4 F (38 C) (04/07 0555) Temp src: Oral (04/07 0555) BP: 122/62 mmHg (04/07 1015) Pulse Rate: 85 (04/07 1015)  Labs:  Recent Labs  01/20/13 0556  HGB 15.8  HCT 43.3  PLT 281  CREATININE 1.00  TROPONINI <0.30    Estimated Creatinine Clearance: 138.6 ml/min (by C-G formula based on Cr of 1).   Medical History: Past Medical History  Diagnosis Date  . Hypertension   . Diabetes mellitus   . Sleep apnea   . Morbid obesity   . Paroxysmal atrial fibrillation     Occurring in 2008, with several recurrence since then (including in the setting of + cocaine on UDS).  Marland Kitchen History of cocaine use   . History of alcohol abuse        Assessment: 53 YOM admitted with chief complaint of dyspnea, found to be in Afib in the ED.  He has a history of recurrent Afib -- not on anticoagulation PTA.  Pharmacy consulted to manage IV heparin, CHADS2 = 2.  Baseline CBC WNL.   Goal of Therapy:  Heparin level 0.3-0.7 units/ml Monitor platelets by anticoagulation protocol: Yes   Plan:  - Heparin 5000 units IV bolus, then - Heparin gtt at 1500 units/hr - Check 6 hr HL - Daily HL / CBC - PT / INR x 1 in AM    Deklyn Gibbon D. Laney Potash, PharmD, BCPS Pager:  [phone] 01/20/2013, 11:33 AM

## 2013-01-20 NOTE — Telephone Encounter (Signed)
Pt is currently in hospital and will need to hold letter in my basket on wall until patient is D/C'd. I will need to find out from patient where to send this letter.

## 2013-01-20 NOTE — ED Notes (Signed)
Pt states that he is unable to void at the present.  Skin warm and dry.

## 2013-01-20 NOTE — ED Notes (Signed)
The pt has been taking ritalin for sleep apnea and the pt thinks this may be the problem.

## 2013-01-20 NOTE — ED Notes (Signed)
Sob since Thursday he was seen at [facility] long ed then and diagnosed  With a virus.  No cough.  Some low grade temp.  Pt alert not distress

## 2013-01-20 NOTE — Progress Notes (Signed)
*  PRELIMINARY RESULTS* Echocardiogram 2D Echocardiogram has been performed.  [name] 01/20/2013, 2:05 PM

## 2013-01-20 NOTE — ED Notes (Signed)
Nasal 02 at 2 liters 

## 2013-01-20 NOTE — Progress Notes (Signed)
ANTICOAGULATION CONSULT NOTE - Follow Up Consult  Pharmacy Consult for Heparin Indication: atrial fibrillation  Heparin  Dosing Weight: 107kg  Labs:  Recent Labs  01/20/13 0556 01/20/13 1803  HGB 15.8  --   HCT 43.3  --   PLT 281  --   HEPARINUNFRC  --  <0.10*   Medications:  Infusions:  . diltiazem (CARDIZEM) infusion 5 mg/hr (01/20/13 1633)  . heparin 1,500 Units/hr (01/20/13 1400)   Assessment:  42 y/o male admitted with atrial fibrillation .  He he has a history of recurrent atrial fibrillation current.  He is currently on UFH infusion.  Heparin Level reported at < 0.1 unit/ml.  No interruptions of heparin reported since starting infusion.  Goal of Therapy:  Heparin level 0.3 - 0.7 units/ml.    Plan:  Heparin 3000 unit bolus, then increase Heparin infusion to 1950 units/hr.  Next Heparin level in 6 hours.  Daily Heparin level and CBC while on Heparin.    [name], [name].D 01/20/2013, 7:07 PM

## 2013-01-20 NOTE — ED Provider Notes (Addendum)
History     CSN: [id number]  Arrival date & time 01/20/13  0547   First MD Initiated Contact with Patient 01/20/13 [phone]      Chief Complaint  Patient presents with  . Shortness of Breath    (Consider location/radiation/quality/duration/timing/severity/associated sxs/prior treatment) HPI  SHONDELL POULSON is a 42 y.o. male past medical history significant for insulin-dependent diabetes and atrial fibrillation complaining of shortness of breath onset yesterday at 2 PM. Patient denies chest pain, abdominal pain, nausea vomiting, palpitations. Patient states he has been sick with an upper respiratory virus for the last several days. He hasn't taken any medications in 2 days because he hasn't eaten anything. Patient started taking Ritalin x3 days ago to help with his daytime sleepiness secondary to sleep apnea. Patient is compliant with his CPAP. He has never been on anticoagulation. Patient has seen a cardiologist but he can't remember their name last visit was over 5 years ago. No prior history of stroke or TIA or CHF.     Past Medical History  Diagnosis Date  . Hypertension   . Diabetes mellitus   . Sleep apnea   . Overweight   . Coronary artery disease   . Atrial fibrillation     Past Surgical History  Procedure Laterality Date  . Appendectomy    . Joint replacement    . Rotator cuff repair    . Tonsillectomy      Family History  Problem Relation Age of Onset  . Heart attack Father   . Diabetes Mother     History  Substance Use Topics  . Smoking status: Current Some Day Smoker -- 0.10 packs/day for 10 years    Last Attempt to Quit: 10/17/2007  . Smokeless tobacco: Current User    Types: Snuff     Comment: smokes one pack per month  . Alcohol Use: Yes     Comment: occasionally      Review of Systems  Constitutional: Negative for fever.  Respiratory: Positive for shortness of breath.   Cardiovascular: Negative for chest pain.  Gastrointestinal: Negative for  nausea, vomiting, abdominal pain and diarrhea.  All other systems reviewed and are negative.    Allergies  Shrimp; Sulfa antibiotics; and Watermelon flavor  Home Medications   Current Outpatient Rx  Name  Route  Sig  Dispense  Refill  . diltiazem (CARDIZEM CD) 360 MG 24 hr capsule   Oral   Take 360 mg by mouth daily.         Marland Kitchen doxycycline (VIBRAMYCIN) 100 MG capsule   Oral   Take 100 mg by mouth 2 (two) times daily.         . fluticasone (FLONASE) 50 MCG/ACT nasal spray      1-2 sprays each nostril twice daily every day   16 g   prn   . glipiZIDE (GLUCOTROL) 10 MG 24 hr tablet   Oral   Take 10 mg by mouth Twice daily.         Marland Kitchen LANTUS SOLOSTAR 100 UNIT/ML injection      30 units qd         . lisinopril-hydrochlorothiazide (PRINZIDE,ZESTORETIC) 10-12.5 MG per tablet   Oral   Take 1 tablet by mouth daily.         . metFORMIN (GLUCOPHAGE) 500 MG tablet   Oral   Take 1,000 mg by mouth 2 (two) times daily.          . methylphenidate (METADATE ER) 20 MG  ER tablet   Oral   Take 20 mg by mouth daily as needed (ADD). 1 or 2 daily as needed           BP 141/87  Pulse 150  Temp(Src) 100.4 F (38 C) (Oral)  Resp 20  SpO2 96%  Physical Exam  Nursing note and vitals reviewed. Constitutional: He is oriented to person, place, and time. He appears well-developed and well-nourished. No distress.  Obese   HENT:  Head: Normocephalic.  Mouth/Throat: Oropharynx is clear and moist.  Eyes: Conjunctivae and EOM are normal. Pupils are equal, round, and reactive to light.  Neck: Normal range of motion. JVD present.  Cardiovascular:  Tachy in 130's to 150's, irregularly irregular  Pulmonary/Chest: Effort normal and breath sounds normal. No stridor. No respiratory distress. He has no wheezes. He has no rales. He exhibits no tenderness.  Abdominal: Soft. Bowel sounds are normal. He exhibits no distension and no mass. There is no tenderness. There is no rebound and  no guarding.  Musculoskeletal: Normal range of motion.  Neurological: He is alert and oriented to person, place, and time.  Psychiatric: He has a normal mood and affect.    ED Course  Procedures (including critical care time)  CRITICAL CARE Performed by: Wynetta Emery   Total critical care time: 45  Critical care time was exclusive of separately billable procedures and treating other patients.  Critical care was necessary to treat or prevent imminent or life-threatening deterioration.  Critical care was time spent personally by me on the following activities: development of treatment plan with patient and/or surrogate as well as nursing, discussions with consultants, evaluation of patient's response to treatment, examination of patient, obtaining history from patient or surrogate, ordering and performing treatments and interventions, ordering and review of laboratory studies, ordering and review of radiographic studies, pulse oximetry and re-evaluation of patient's condition.  Medications  aspirin chewable tablet 81 mg (81 mg Oral Given 01/20/13 1127)  0.9 %  sodium chloride infusion ( Intravenous Rate/Dose Change 01/20/13 1132)  HYDROmorphone (DILAUDID) injection 1 mg (not administered)  ondansetron (ZOFRAN) injection 4 mg (not administered)  albuterol (PROVENTIL) (5 MG/ML) 0.5% nebulizer solution 2.5 mg (not administered)  heparin bolus via infusion 5,000 Units (not administered)  heparin ADULT infusion 100 units/mL (25000 units/250 mL) (not administered)  diltiazem (CARDIZEM) injection SOLN 10 mg (0 mg Intravenous Stopped 01/20/13 0618)  sodium chloride 0.9 % bolus 1,000 mL (0 mLs Intravenous Stopped 01/20/13 0749)  diltiazem (CARDIZEM) 100 mg in dextrose 5 % 100 mL infusion (10 mg/hr Intravenous New Bag/Given 01/20/13 0643)  cefTRIAXone (ROCEPHIN) 2 g in dextrose 5 % 50 mL IVPB (0 g Intravenous Stopped 01/20/13 1011)  sodium chloride 0.9 % bolus 1,000 mL (0 mLs Intravenous Stopped 01/20/13  1128)  heparin 5000 UNIT/ML injection (5,000 Units  Given 01/20/13 1144)     Labs Reviewed  CBC WITH DIFFERENTIAL - Abnormal; Notable for the following:    MCHC 36.5 (*)    Monocytes Relative 17 (*)    Monocytes Absolute 1.6 (*)    All other components within normal limits  COMPREHENSIVE METABOLIC PANEL - Abnormal; Notable for the following:    Sodium 133 (*)    Glucose, Bld 197 (*)    Albumin 3.3 (*)    All other components within normal limits  TROPONIN I   Dg Chest Port 1 View  01/20/2013  *RADIOLOGY REPORT*  Clinical Data: Shortness of breath.  PORTABLE CHEST - 1 VIEW  Comparison: Chest radiograph  performed 01/16/2013  Findings: The lungs are well-aerated.  Pulmonary vascularity is at the upper limits of normal.  Mild bibasilar opacities may reflect atelectasis or possibly mild pneumonia.  There is no evidence of pleural effusion or pneumothorax.  The cardiomediastinal silhouette is borderline enlarged.  No acute osseous abnormalities are seen.  IMPRESSION: Mild bibasilar opacities may reflect atelectasis or possibly mild pneumonia.  Borderline cardiomegaly noted.   Original Report Authenticated By: Tonia Ghent, [name]     Date: 01/20/2013  Rate: 154  Rhythm: atrial fibrillation  QRS Axis: normal  Intervals: normal  ST/T Wave abnormalities: nonspecific ST/T changes  Conduction Disutrbances:none  Narrative Interpretation:   Old EKG Reviewed: changes noted NSR on prior tracing of 01/16/2013   Date: 01/20/2013 08:24  Rate: 103  Rhythm: atrial fibrillation  QRS Axis: normal  Intervals: normal  ST/T Wave abnormalities: nonspecific ST/T changes  Conduction Disutrbances:none  Narrative Interpretation:   Old EKG Reviewed: Atrial fibrillation now with better rate control at approximately 100 beats a minute      1. Atrial fibrillation with RVR   2. CAP (community acquired pneumonia)       MDM   CAN LUCCI is a 42 y.o. male with hypertension and diabetes and prior  history of atrial fibrillation now with atrial fibrillation rapid ventricular response at approximately 150 beats per minute. Patient did not respond to 10 mg of diltiazem, administration of Cardizem drip achieved rate control of approximately 100 beats a minute.  It is unclear when the patient went into Afib, he had no perception of palpitations but developed a shortness of breath yesterday afternoon. Patient recently started taking Ritalin for excessive sleepiness during the day. Patient also has upper respiratory that may be developing into a pneumonia with low-grade fever and abnormality seen on chest x-ray. I one of these may have been the cause of his atrial fibrillation. Patient will be started on Rocephin for presumed community-acquired pneumonia.  Patient's chads 2 score is 2. I discussed this with attending Dr. Preston Fleeting who feels that it is reasonable to wait for cardiology consult before he is anticoagulated.  Cardiology consult from Dr. Konrad Dolores appreciated: He feels that the patient is not appropriate for cardioversion at this time because it is unclear when the episode began. He recommends admission and optimization with anticoagulation.  Hospitalist consult from Dr. Sharon Seller appreciated: She will admit the patient to a step down bed.    Filed Vitals:   01/20/13 0822 01/20/13 0930 01/20/13 1000 01/20/13 1015  BP:  98/44 102/60 122/62  Pulse:  95 85 85  Temp:      TempSrc:      Resp:  28 17 24   SpO2: 98% 95% 96% 94%      Wynetta Emery, PA-C 01/20/13 1134  Nylan Nevel, PA-C 01/20/13 1216

## 2013-01-20 NOTE — Assessment & Plan Note (Signed)
Good control and compliance with CPAP 13/[employer].

## 2013-01-20 NOTE — H&P (Signed)
[facility] History and Physical  [name] ZOX:[id number] DOB: [birth year] DOA: 01/20/2013  Referring physician: Dr Preston Fleeting PCP: unassigned Specialists: Dr Elease Hashimoto  Chief Complaint: Shortness of breath  HPI: [name] is a 42 y.o. male with past medical history significant for paroxysmal atrial fibrillation, hypertension, diabetes mellitus sleep apnea, alcohol&cocaine abuse in the past presents with above complaints. He states that he began having shortness of breath with exertion yesterday and it got worse today. He admits to cold symptoms-nasal congestion but denies cough. He admits to subjective fevers. He denies chest pain and no leg swelling. He was seen in the ED and found to be in atrial fibrillation with rapid ventricular response-ekg was obtained and showed a fib 103 with no acute ischemic changes. She was placed on Cardizem drip and Cardiology was consulted chest x-ray done in ED showed mild bibasal opacities-atelectasis versus pneumonia, Was also noted to be febrile to 100.4 in ED. he is admitted for further evaluation and management.   Review of Systems: The patient denies anorexia, fever, weight loss,, vision loss, decreased hearing, hoarseness, chest pain, syncope, peripheral edema, balance deficits, hemoptysis, abdominal pain, melena, hematochezia, severe indigestion/heartburn, hematuria, incontinence, genital sores, muscle weakness, suspicious skin lesions, transient blindness, difficulty walking, depression, unusual weight change, abnormal bleeding, enlarged lymph nodes. Past Medical History  Diagnosis Date  . Hypertension   . Diabetes mellitus   . Sleep apnea   . Morbid obesity   . History of cocaine use   . History of alcohol abuse   . Paroxysmal atrial fibrillation     Occurring in 2008, with several recurrence since then (including in the setting of + cocaine on UDS).   Past Surgical History  Procedure Laterality Date  . Appendectomy    . Rotator cuff  repair    . Tonsillectomy     Social History:  reports that he has been smoking.  His smokeless tobacco use includes Snuff. He reports that  drinks alcohol-states that he's not had any in at least 2 weeks. He states he's not used any cocaine in over a year  where does patient live--home Can patient participate in ADLs-yes  Allergies  Allergen Reactions  . Shrimp (Shellfish Allergy) Swelling  . Sulfa Antibiotics   . Watermelon Flavor Nausea And Vomiting    Family History  Problem Relation Age of Onset  . Heart attack Father   . Diabetes Mother     Prior to Admission medications   Medication Sig Start Date End Date Taking? Authorizing Provider  diltiazem (CARDIZEM CD) 360 MG 24 hr capsule Take 360 mg by mouth daily. 02/28/11  Yes Historical Provider, MD  doxycycline (VIBRAMYCIN) 100 MG capsule Take 100 mg by mouth 2 (two) times daily.   Yes Historical Provider, MD  fluticasone (FLONASE) 50 MCG/ACT nasal spray Place 1 spray into the nose daily. 10/21/12 10/21/13 Yes Clinton D Young, MD  glipiZIDE (GLUCOTROL) 10 MG 24 hr tablet Take 10 mg by mouth Twice daily. 02/28/11  Yes Historical Provider, MD  LANTUS SOLOSTAR 100 UNIT/ML injection Inject 30 Units into the skin daily with breakfast.  04/22/12  Yes Historical Provider, MD  lisinopril-hydrochlorothiazide (PRINZIDE,ZESTORETIC) 10-12.5 MG per tablet Take 1 tablet by mouth daily.   Yes Historical Provider, MD  metFORMIN (GLUCOPHAGE) 500 MG tablet Take 1,000 mg by mouth 2 (two) times daily.    Yes Historical Provider, MD  methylphenidate (METADATE ER) 20 MG ER tablet Take 20 mg by mouth daily as needed (ADD).  01/14/13  Yes Waymon Budge, MD   Physical Exam: Filed Vitals:   01/20/13 1200 01/20/13 1215 01/20/13 1400 01/20/13 1500  BP: 134/70 133/77 124/75   Pulse: 110 100 96 99  Temp:   102.2 F (39 C)   TempSrc:   Oral   Resp: 24 23 14 24   Height:      Weight:      SpO2: 99% 98% 96% 95%    Constitutional: Vital signs reviewed.  Patient  is a well-developed, obese in no acute distress and cooperative with exam. Alert and oriented x3.  Head: Normocephalic and atraumatic Mouth: no erythema or exudates, MMM Eyes: PERRL, EOMI, conjunctivae normal, No scleral icterus.  Neck: Supple, Trachea midline normal ROM, No JVD, mass, thyromegaly, or carotid bruit present.  Cardiovascular: RRR, S1 normal, S2 normal, no MRG, pulses symmetric and intact bilaterally Pulmonary/Chest: Decreased BS Sounds at bases, no wheezes, rales, or rhonchi Abdominal: Soft. Non-tender, non-distended, bowel sounds are normal, no masses, organomegaly, or guarding present.  GU: no CVA tenderness Musculoskeletal: No joint deformities, erythema, or stiffness, ROM full and no nontender Hematology: no cervical, inginal, or axillary adenopathy.  Neurological: A&O x3, Strength is normal and symmetric bilaterally, cranial nerve II-XII are grossly intact, no focal motor deficit, sensory intact to light touch bilaterally.  Skin: Warm, dry and intact. No rash, cyanosis, or clubbing.  Psychiatric: Normal mood and affect. speech and behavior is normal.  Labs on Admission:  Basic Metabolic Panel:  Recent Labs Lab 01/16/13 2145 01/20/13 0556  NA 135 133*  K 3.8 4.2  CL 98 98  CO2 27 23  GLUCOSE 126* 197*  BUN 9 12  CREATININE 0.80 1.00  CALCIUM 9.0 9.0   Liver Function Tests:  Recent Labs Lab 01/20/13 0556  AST 33  ALT 47  ALKPHOS 83  BILITOT 0.7  PROT 7.6  ALBUMIN 3.3*   No results found for this basename: LIPASE, AMYLASE,  in the last 168 hours No results found for this basename: AMMONIA,  in the last 168 hours CBC:  Recent Labs Lab 01/16/13 2145 01/20/13 0556  WBC 10.4 9.6  NEUTROABS 7.4 6.3  HGB 14.7 15.8  HCT 42.3 43.3  MCV 80.9 78.9  PLT 292 281   Cardiac Enzymes:  Recent Labs Lab 01/16/13 2145 01/20/13 0556 01/20/13 1116  CKTOTAL 104  --   --   CKMB 1.2  --   --   TROPONINI <0.30 <0.30 <0.30    BNP (last 3 results) No  results found for this basename: PROBNP,  in the last 8760 hours CBG:  Recent Labs Lab 01/20/13 1509  GLUCAP 162*    Radiological Exams on Admission: Dg Chest Port 1 View  01/20/2013  *RADIOLOGY REPORT*  Clinical Data: Shortness of breath.  PORTABLE CHEST - 1 VIEW  Comparison: Chest radiograph performed 01/16/2013  Findings: The lungs are well-aerated.  Pulmonary vascularity is at the upper limits of normal.  Mild bibasilar opacities may reflect atelectasis or possibly mild pneumonia.  There is no evidence of pleural effusion or pneumothorax.  The cardiomediastinal silhouette is borderline enlarged.  No acute osseous abnormalities are seen.  IMPRESSION: Mild bibasilar opacities may reflect atelectasis or possibly mild pneumonia.  Borderline cardiomegaly noted.   Original Report Authenticated By: Tonia Ghent, [name]      Assessment/Plan Active Problems:   Atrial fibrillation with RVR/paroxysmal Atrial fib -Patient was started on Cardizem drip and heparin drip in ED-will continue -He spontaneously converted into normal sinus rhythm on a Cardizem drip,  appreciate cardiology assistance to follow and change to oral when appropriate. -Cardiac enzymes and follow - obtain urine drug screen, and TSH -Likely precipitated by possible pneumonia- treatment as below CAP (community acquired pneumonia) -empiric antibiotics with Rocephin and Zithromax -Blood cultures follow further treat as appropriate the   DIABETES MELLITUS, TYPE II -continue glipizide and Lantus, monitor Accu-Cheks and cover with sliding scale insulin -Hold metformin for now and follow   HYPERTENSION -Monitor on Cardizem drip, follow and resume on lisinopril in am  -OBESITY, MORBID   COCAINE/alcohol Abuse, past hx -he states he has not had any alcohol in over 2 weeks, and no cocaine in over a year -Obtain urine drug screen an follow Hyponatremia -hold HCTZ, follow and recheck    Code Status: Full code  Family Communication:  Directly with the patient at bedside  Disposition Plan: admit to stepdown Time spent: >30  Kela Millin [facility] Pager [phone]  If 7PM-7AM, please contact night-coverage [email or web address] Password [facility] 01/20/2013, 3:56 PM

## 2013-01-20 NOTE — ED Notes (Signed)
pCXR at BS 

## 2013-01-20 NOTE — Consult Note (Signed)
CARDIOLOGY CONSULT NOTE  Patient ID: CASHMERE HARMES, MRN: [id number], DOB/AGE: Nov 08, 1970 42 y.o. Admit date: 01/20/2013   Date of Consult: 01/20/2013 Primary Physician: Katy Apo, MD Primary Cardiologist: Remotely Dr. Reyes Ivan  Chief Complaint: SOB Reason for Consult: atrial fibrillation  HPI: Mr. Hillery is a 42 y/o M with history of HTN, DM, morbid obesity, OSA on CPAP, prior EtOH/cocaine use (currently denies), and PAF starting in 2008 with recurrence in 2009, 2010, and 2011 (the latter 2 recurrences in the setting of + cocaine on UDS). He presented to [facility] today with complaints of dyspnea on exertion and was found to be in rapid atrial fibrillation and fever of 100.4. He saw Dr. 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 He has taken 2 doses so far. He took this starting 01/16/13, but later that day went to the [facility] ER because he felt like he had a virus with chills, malaise, and vomiting. Temp was 100.3 at that visit and he was diagnosed with viral gastroenteritis and discharged home (EKG at that time showed NSR). He felt poorly through the following next day again with general fatigue so he rested through the weekend. On Sunday while standing in line at the dollar store, he began to get shaking chils. When he got home and walked to the fridge to get a drink, he began to notice significant DOE. This improved with rest, but this morning he could not get his work clothes on due to dyspnea. He reports that with prior episodes of afib, he was able to tell he was in it, but this time he was unaware that was happening. He denies any sense of palpitations. He denies chest pain, current nausea, vomiting, presyncope, syncope, LEE, orthopnea, cough or bleeding difficulties. He has been on doxycycline for 2 weeks for an abdominal abscess that he reports does not seem to have changed much. In the ER, initial HR 147 in coarse afib. He was treated  with Cardizem bolus 10mg  then gtt with improvement in HR but drop in BP thus received 1L NS. Troponin neg x 1, WBC WNL, Na 133, glu 197, CXR with mild bibasilar opacities ?atx with PNA.  He also received 2g ceftriaxone and 325mg  of ASA by the ED.  He reports typical compliance with medications except states that he hasn't been taking them since 4/3 when he began feeling bad (including Diltiazem 360mg  daily).  Past Medical History  Diagnosis Date  . Hypertension   . Diabetes mellitus   . Sleep apnea   . Morbid obesity   . Paroxysmal atrial fibrillation     Occurring in 2008, with several recurrence since then (including in the setting of + cocaine on UDS).  Marland Kitchen History of cocaine use   . History of alcohol abuse       Most Recent Cardiac Studies: 2D Echo 2009 SUMMARY - Overall left ventricular systolic function was normal. Left ventricular ejection fraction was estimated , range being 55 % to 60 %. There was no diagnostic evidence of left ventricular regional wall motion abnormalities. Left ventricular wall thickness was mildly increased. - This study was inadequate to rule out the possibility of a bicuspid aortic valve.   Surgical History:  Past Surgical History  Procedure Laterality Date  . Appendectomy    . Joint replacement    . Rotator cuff repair    . Tonsillectomy       Home Meds: Prior  to Admission medications   Medication Sig Start Date End Date Taking? Authorizing Provider  diltiazem (CARDIZEM CD) 360 MG 24 hr capsule Take 360 mg by mouth daily. 02/28/11  Yes Historical Provider, MD  doxycycline (VIBRAMYCIN) 100 MG capsule Take 100 mg by mouth 2 (two) times daily.   Yes Historical Provider, MD  fluticasone (FLONASE) 50 MCG/ACT nasal spray Place 1 spray into the nose daily. 10/21/12 10/21/13 Yes Clinton D Young, MD  glipiZIDE (GLUCOTROL) 10 MG 24 hr tablet Take 10 mg by mouth Twice daily. 02/28/11  Yes Historical Provider, MD  LANTUS SOLOSTAR 100 UNIT/ML injection Inject 30  Units into the skin daily with breakfast.  04/22/12  Yes Historical Provider, MD  lisinopril-hydrochlorothiazide (PRINZIDE,ZESTORETIC) 10-12.5 MG per tablet Take 1 tablet by mouth daily.   Yes Historical Provider, MD  metFORMIN (GLUCOPHAGE) 500 MG tablet Take 1,000 mg by mouth 2 (two) times daily.    Yes Historical Provider, MD  methylphenidate (METADATE ER) 20 MG ER tablet Take 20 mg by mouth daily as needed (ADD).  01/14/13  Yes Waymon Budge, MD    Inpatient Medications:  . aspirin  325 mg Oral Daily    Allergies:  Allergies  Allergen Reactions  . Shrimp (Shellfish Allergy) Swelling  . Sulfa Antibiotics   . Watermelon Flavor Nausea And Vomiting    History   Social History  . Marital Status: Single    Spouse Name: N/A    Number of Children: 0  . Years of Education: N/A   Occupational History  . vac truck helper [employer]   Social History Main Topics  . Smoking status: Current Some Day Smoker -- 0.10 packs/day for 10 years    Last Attempt to Quit: 10/17/2007  . Smokeless tobacco: Current User    Types: Snuff     Comment: smokes one pack per month  . Alcohol Use: Yes     Comment: occasionally  . Drug Use: No     Comment: cocaine in the past  . Sexually Active: Not on file   Other Topics Concern  . Not on file   Social History Narrative  . No narrative on file     Family History  Problem Relation Age of Onset  . Heart attack Father   . Diabetes Mother      Review of Systems: General: no night sweats or weight changes. +fever, chills Cardiovascular: see above Dermatological: negative for rash Respiratory: negative for cough or wheezing Urologic: negative for hematuria Abdominal: negative for diarrhea, bright red blood per rectum, melena, or hematemesis Neurologic: negative for visual changes, syncope, or dizziness All other systems reviewed and are otherwise negative except as noted above.  Labs:  Recent Labs  01/20/13 0556  TROPONINI <0.30    Lab Results  Component Value Date   WBC 9.6 01/20/2013   HGB 15.8 01/20/2013   HCT 43.3 01/20/2013   MCV 78.9 01/20/2013   PLT 281 01/20/2013    Recent Labs Lab 01/20/13 0556  NA 133*  K 4.2  CL 98  CO2 23  BUN 12  CREATININE 1.00  CALCIUM 9.0  PROT 7.6  BILITOT 0.7  ALKPHOS 83  ALT 47  AST 33  GLUCOSE 197*      Radiology/Studies:  Dg Chest Port 1 View 01/20/2013  *RADIOLOGY REPORT*  Clinical Data: Shortness of breath.  PORTABLE CHEST - 1 VIEW  Comparison: Chest radiograph performed 01/16/2013  Findings: The lungs are well-aerated.  Pulmonary vascularity is at the upper  limits of normal.  Mild bibasilar opacities may reflect atelectasis or possibly mild pneumonia.  There is no evidence of pleural effusion or pneumothorax.  The cardiomediastinal silhouette is borderline enlarged.  No acute osseous abnormalities are seen.  IMPRESSION: Mild bibasilar opacities may reflect atelectasis or possibly mild pneumonia.  Borderline cardiomegaly noted.   Original Report Authenticated By: Tonia Ghent, [name]     EKG: 01/20/13 at 8:24: coarse atrial fibrillation 103bpm, consider anteroseptal infarct, nonspecific T abnormalities inferior leads, ST upsloping I, avL  His nonspecific changes are slightly different than tracing 01/16/13.  Physical Exam: Blood pressure 102/60, pulse 85, temperature 100.4 F (38 C), temperature source Oral, resp. rate 17, SpO2 96.00%. General: Well developed obese AAM in no acute distress. Head: Normocephalic, atraumatic, sclera non-icteric, no xanthomas, nares are without discharge.  Neck: Negative for carotid bruits. JVD not elevated. Lungs: Diminshed BS at bases. No wheezes, rales, or rhonchi. Breathing is unlabored. Heart: irregular rhythm, controlled rate with S1 S2. No murmurs, rubs, or gallops appreciated. Abdomen: Soft, non-tender, non-distended with normoactive bowel sounds. No rebound or guarding. Erythematous area about 3 inches in diameter on his mid abdomen  without obvious fluctuance or suppuration Msk:  Strength and tone appear normal for age. Extremities: No clubbing or cyanosis. No edema.  Distal pedal pulses are 2+ and equal bilaterally. Neuro: Alert and oriented X 3. No facial asymmetry. No focal deficit. Moves all extremities spontaneously. Psych:  Responds to questions appropriately with a normal affect.   Assessment and Plan:   1. Paroxysmal atrial fibrillation with RVR 2. History of cocaine/alcohol use 3. Fever with ? pneumonia and abdominal abscess 4. Recently diagnosed idiopathic hypersomnia, placed on PRN ritalin 5. OSA on CPAP 6. HTN 7. Diabetes mellitus  It is difficult to know how long he has been in atrial fibrillation as he was not aware he was in this rhythm on admission. Recommend diltiazem drip for now as his general medical issues are being worked up i.e. Etiology of fever. Check UDS although patient denies cocaine use. Would not use ritalin in the future. Update 2D echocardiogram. Check cardiac enzymes given EKG changes and start IV heparin. He will likely need transition to oral anticoagulation if he rules out given CHADS2 score of 2. Would hold off on resuming ACEI/HCTZ for now given soft BP's. See below for additional thoughts.  Signed, Ronie Spies PA-C 01/20/2013, 10:15 AM   Attending Note:   The patient was seen and examined.  Agree with assessment and plan as noted above.  Changes made to the above note as needed.  Sahmir presents with fever to 100.4, possible pneumonia by CHF, abdominal wall abscess and atrial fib ( of unknown duration)  He is feeling better with controlled rate on dilt drip.  Exam is c/w A-fib but is otherwise unremarkable.   In the past, he has converted to NSR on his own.   ECG shows some ST changes - we will get cardiac enzymes.  Further ischemic eval depending on enzymes.  He will likely need a myview at some point ( 2 day [employer]).  Will cath if enzymes are +   I think we  should start heparin for now - he will need coumadin or new agent at discharge  Alvia Grove., MD, [facility] 01/20/2013, 11:08 AM

## 2013-01-21 DIAGNOSIS — I10 Essential (primary) hypertension: Secondary | ICD-10-CM

## 2013-01-21 DIAGNOSIS — G4733 Obstructive sleep apnea (adult) (pediatric): Secondary | ICD-10-CM

## 2013-01-21 DIAGNOSIS — I5032 Chronic diastolic (congestive) heart failure: Secondary | ICD-10-CM | POA: Diagnosis present

## 2013-01-21 LAB — DRUGS OF ABUSE SCREEN W/O ALC, ROUTINE URINE
Barbiturate Quant, Ur: NEGATIVE
Cocaine Metabolites: NEGATIVE
Creatinine,U: 415.2 mg/dL
Methadone: NEGATIVE
Opiate Screen, Urine: NEGATIVE
Phencyclidine (PCP): NEGATIVE

## 2013-01-21 LAB — BASIC METABOLIC PANEL
BUN: 10 mg/dL (ref 6–23)
Calcium: 8.7 mg/dL (ref 8.4–10.5)
GFR calc non Af Amer: >90 mL/min (ref >90)
Glucose, Bld: 180 mg/dL — ABNORMAL HIGH (ref 70–99)
Sodium: 137 mEq/L (ref 135–145)

## 2013-01-21 LAB — CBC
MCH: 28 pg (ref 26.0–34.0)
MCHC: 35.5 g/dL (ref 30.0–36.0)
MCV: 78.8 fL (ref 78.0–100.0)
Platelets: 239 10*3/uL (ref 150–400)
RDW: 13.4 % (ref 11.5–15.5)

## 2013-01-21 LAB — PROTIME-INR: Prothrombin Time: 15.1 seconds (ref 11.6–15.2)

## 2013-01-21 LAB — HEPARIN LEVEL (UNFRACTIONATED): Heparin Unfractionated: 0.14 IU/mL — ABNORMAL LOW (ref 0.30–0.70)

## 2013-01-21 LAB — URINE CULTURE: Culture: NO GROWTH

## 2013-01-21 LAB — GLUCOSE, CAPILLARY
Glucose-Capillary: 146 mg/dL — ABNORMAL HIGH (ref 70–99)
Glucose-Capillary: 93 mg/dL (ref 70–99)
Glucose-Capillary: 132 mg/dL — ABNORMAL HIGH (ref 70–99)

## 2013-01-21 LAB — LIPID PANEL
HDL: 20 mg/dL — ABNORMAL LOW (ref >39)
LDL Cholesterol: 88 mg/dL (ref 0–99)
Triglycerides: 135 mg/dL (ref <150)
VLDL: 27 mg/dL (ref 0–40)

## 2013-01-21 MED ORDER — HEPARIN (PORCINE) IN NACL 100-0.45 UNIT/ML-% IJ SOLN
2650.0000 [IU]/h | INTRAMUSCULAR | Status: DC
  Administered 2013-01-21: 2650 [IU]/h via INTRAVENOUS
  Filled 2013-01-21: qty 250

## 2013-01-21 MED ORDER — RIVAROXABAN 20 MG PO TABS
20.0000 mg | ORAL_TABLET | Freq: Every day | ORAL | Status: DC
  Administered 2013-01-21: 20 mg via ORAL
  Filled 2013-01-21 (×2): qty 1

## 2013-01-21 MED ORDER — HEPARIN BOLUS VIA INFUSION
3500.0000 [IU] | Freq: Once | INTRAVENOUS | Status: AC
  Administered 2013-01-21: 3500 [IU] via INTRAVENOUS
  Filled 2013-01-21: qty 3500

## 2013-01-21 MED ORDER — DILTIAZEM HCL ER COATED BEADS 360 MG PO CP24
360.0000 mg | ORAL_CAPSULE | Freq: Every day | ORAL | Status: DC
  Administered 2013-01-21 – 2013-01-22 (×2): 360 mg via ORAL
  Filled 2013-01-21 (×2): qty 1

## 2013-01-21 MED ORDER — SODIUM CHLORIDE 0.9 % IV SOLN: INTRAVENOUS | Status: DC

## 2013-01-21 NOTE — Progress Notes (Signed)
ANTICOAGULATION CONSULT NOTE - Follow Up Consult  Pharmacy Consult:  Heparin Indication:  Atrial fibrillation  Allergies  Allergen Reactions  . Shrimp (Shellfish Allergy) Swelling  . Sulfa Antibiotics   . Watermelon Flavor Nausea And Vomiting    Patient Measurements: Height: 5' 8.9" (175 cm) Weight: 328 lb 7.8 oz (149 kg) IBW/kg (Calculated) : 70.47 Heparin Dosing Weight: 107 kg  Vital Signs: Temp: 99.1 F (37.3 C) (04/08 0745) Temp src: Oral (04/08 0745) BP: 136/73 mmHg (04/08 0745) Pulse Rate: 81 (04/08 0745)  Labs:  Recent Labs  01/20/13 0556 01/20/13 1116 01/20/13 1803 01/21/13 0200 01/21/13 0950  HGB 15.8  --   --   --  13.2  HCT 43.3  --   --   --  37.2*  PLT 281  --   --   --  239  LABPROT  --   --   --  15.1  --   INR  --   --   --  1.21  --   HEPARINUNFRC  --   --  <0.10* 0.14* 0.29*  CREATININE 1.00  --   --  0.97  --   TROPONINI <0.30 <0.30 <0.30 <0.30  --     Estimated Creatinine Clearance: 143 ml/min (by C-G formula based on Cr of 0.97).   Medical History: Past Medical History  Diagnosis Date  . Hypertension   . Diabetes mellitus   . Sleep apnea   . Morbid obesity   . History of cocaine use   . History of alcohol abuse   . Paroxysmal atrial fibrillation     Occurring in 2008, with several recurrence since then (including in the setting of + cocaine on UDS).    Assessment: 8 YOM admitted with chief complaint of dyspnea, found to be in Afib in the ED.  He has a history of recurrent Afib -- not on anticoagulation PTA.  Pharmacy consulted to manage IV heparin, CHADS2 = 2.    Heparin level (0.29) is just below-goal on 2450 units/hr. No problem with line / infusion and no bleeding per RN.   Goal of Therapy:  Heparin level 0.3-0.7 units/ml Monitor platelets by anticoagulation protocol: Yes  Plan:  1. Increase IV heparin to 2650 units/hr. 2. Recheck heparin level in 6 hrs. 3. Continue daily heparin level and CBC.  Tad Moore, BCPS  Clinical Pharmacist Pager [phone]  01/21/2013 11:23 AM

## 2013-01-21 NOTE — Progress Notes (Signed)
ANTICOAGULATION CONSULT NOTE - Follow Up Consult  Pharmacy Consult:  Heparin Indication:  Atrial fibrillation  Allergies  Allergen Reactions  . Shrimp (Shellfish Allergy) Swelling  . Sulfa Antibiotics   . Watermelon Flavor Nausea And Vomiting    Patient Measurements: Height: 5' 8.9" (175 cm) Weight: 328 lb 0.7 oz (148.8 kg) IBW/kg (Calculated) : 70.47 Heparin Dosing Weight: 107 kg  Vital Signs: Temp: 101.2 F (38.4 C) (04/08 0200) Temp src: Axillary (04/08 0200) BP: 125/72 mmHg (04/07 2317) Pulse Rate: 106 (04/08 0200)  Labs:  Recent Labs  01/20/13 0556 01/20/13 1116 01/20/13 1803 01/21/13 0200  HGB 15.8  --   --   --   HCT 43.3  --   --   --   PLT 281  --   --   --   LABPROT  --   --   --  15.1  INR  --   --   --  1.21  HEPARINUNFRC  --   --  <0.10* 0.14*  CREATININE 1.00  --   --   --   TROPONINI <0.30 <0.30 <0.30  --     Estimated Creatinine Clearance: 138.6 ml/min (by C-G formula based on Cr of 1).   Medical History: Past Medical History  Diagnosis Date  . Hypertension   . Diabetes mellitus   . Sleep apnea   . Morbid obesity   . History of cocaine use   . History of alcohol abuse   . Paroxysmal atrial fibrillation     Occurring in 2008, with several recurrence since then (including in the setting of + cocaine on UDS).    Assessment: 2 YOM admitted with chief complaint of dyspnea, found to be in Afib in the ED.  He has a history of recurrent Afib -- not on anticoagulation PTA.  Pharmacy consulted to manage IV heparin, CHADS2 = 2.    Heparin level (0.14) is below-goal on 1950 units/hr. No problem with line / infusion and no bleeding per RN.   Goal of Therapy:  Heparin level 0.3-0.7 units/ml Monitor platelets by anticoagulation protocol: Yes  Plan:  1. Heparin IV bolus 3500 units x 1, then increase IV infusion to 2450 units/hr 2. Heparin level in 6 hours  Lorre Munroe, PharmD  01/21/2013, 3:17 AM

## 2013-01-21 NOTE — Progress Notes (Addendum)
[facility] Progress Note [place] TEAM 1 - Stepdown/ICU TEAM   [name] YQM:[id number] DOB: 1971-06-21 DOA: 01/20/2013 PCP: Katy Apo, MD  Brief narrative/obtained from HPI: [name] is a 42 y.o. male with past medical history significant for paroxysmal atrial fibrillation, hypertension, diabetes mellitus sleep apnea, alcohol&cocaine abuse in the past presents with above complaints. He states that he began having shortness of breath with exertion on the day before admission which progressively worsened. He admits to cold symptoms-nasal congestion but denies cough. He admits to subjective fevers. He denies chest pain and no leg swelling. He was seen in the ED and found to be in atrial fibrillation with rapid ventricular response-ekg was obtained and showed a fib 103 with no acute ischemic changes. She was placed on Cardizem drip and Cardiology was consulted chest x-ray done in ED showed mild bibasal opacities-atelectasis versus pneumonia, Was also noted to be febrile to 100.4 in ED. he is admitted for further evaluation and management.   Assessment/Plan: Principal Problem:   Atrial fibrillation with RVR -Now in normal sinus rhythm -Cardiology has converted him to by mouth Cardizem and Xarelto -Transfer to telemetry  Active Problems:   CAP (community acquired pneumonia)? - no other source of fever found as of yet -Continue to treat with current antibiotics    DIABETES MELLITUS, TYPE II -Continue glipizide    OBESITY, MORBID    COCAINE Abuse, past hx -He has not used cocaine in 2 years    HYPERTENSION -Continue current medication    Chronic diastolic heart failure-grade 2 -Continue calcium channel blocker   Code Status: Full code  Family Communication: none Disposition Plan: Transferred to telemetry  Consultants: Cardio  Procedures: None  Antibiotics: Rocephin, Zithromax-4/7  DVT prophylaxis: Heparin/Xarelto  HPI/Subjective: Patient is  sitting up in a chair and has no complaints when asked about flulike or cold-like symptoms he denies this stating he's never had them. He currently does not have a cough or shortness of breath. I have discussed starting anticoagulation for A. fib and have explained the pathophysiology and the relationship to CVAs. The patient appears to be agreeable. I have also discussed the echocardiogram findings of grade 2 diastolic dysfunction and have advised he needs better blood pressure control and weight loss.   Objective: Blood pressure 144/77, pulse 95, temperature 98.8 F (37.1 C), temperature source Oral, resp. rate 22, height 5' 8.9" (1.75 m), weight 149 kg (328 lb 7.8 oz), SpO2 96.00%.  Intake/Output Summary (Last 24 hours) at 01/21/13 1646 Last data filed at 01/21/13 1600  Gross per 24 hour  Intake   2506 ml  Output   2425 ml  Net     81 ml     Exam: General: No acute respiratory distress Lungs: Clear to auscultation bilaterally without wheezes or crackles Cardiovascular: Regular rate and rhythm without murmur gallop or rub normal S1 and S2 Abdomen: Nontender, nondistended, soft, bowel sounds positive, no rebound, no ascites, no appreciable mass Extremities: No significant cyanosis, clubbing, or edema bilateral lower extremities  Data Reviewed: Basic Metabolic Panel:  Recent Labs Lab 01/16/13 2145 01/20/13 0556 01/21/13 0200  NA 135 133* 137  K 3.8 4.2 4.3  CL 98 98 100  CO2 27 23 27   GLUCOSE 126* 197* 180*  BUN 9 12 10   CREATININE 0.80 1.00 0.97  CALCIUM 9.0 9.0 8.7   Liver Function Tests:  Recent Labs Lab 01/20/13 0556  AST 33  ALT 47  ALKPHOS 83  BILITOT 0.7  PROT  7.6  ALBUMIN 3.3*   No results found for this basename: LIPASE, AMYLASE,  in the last 168 hours No results found for this basename: AMMONIA,  in the last 168 hours CBC:  Recent Labs Lab 01/16/13 2145 01/20/13 0556 01/21/13 0950  WBC 10.4 9.6 6.8  NEUTROABS 7.4 6.3  --   HGB 14.7 15.8 13.2   HCT 42.3 43.3 37.2*  MCV 80.9 78.9 78.8  PLT 292 281 239   Cardiac Enzymes:  Recent Labs Lab 01/16/13 2145 01/20/13 0556 01/20/13 1116 01/20/13 1803 01/21/13 0200  CKTOTAL 104  --   --   --   --   CKMB 1.2  --   --   --   --   TROPONINI <0.30 <0.30 <0.30 <0.30 <0.30   BNP (last 3 results) No results found for this basename: PROBNP,  in the last 8760 hours CBG:  Recent Labs Lab 01/20/13 1643 01/20/13 2119 01/21/13 0752 01/21/13 1159 01/21/13 1622  GLUCAP 190* 219* 167* 146* 93    Recent Results (from the past 240 hour(s))  CULTURE, BLOOD (ROUTINE X 2)     Status: None   Collection Time    01/20/13  2:26 PM      Result Value Range Status   Specimen Description BLOOD RIGHT HAND   Final   Special Requests BOTTLES DRAWN AEROBIC ONLY 8CC   Final   Culture  Setup Time 01/20/2013 22:47   Final   Culture     Final   Value:        BLOOD CULTURE RECEIVED NO GROWTH TO DATE CULTURE WILL BE HELD FOR 5 DAYS BEFORE ISSUING A FINAL NEGATIVE REPORT   Report Status PENDING   Incomplete  CULTURE, BLOOD (ROUTINE X 2)     Status: None   Collection Time    01/20/13  2:37 PM      Result Value Range Status   Specimen Description BLOOD RIGHT FOREARM   Final   Special Requests BOTTLES DRAWN AEROBIC ONLY 6CC   Final   Culture  Setup Time 01/20/2013 22:43   Final   Culture     Final   Value:        BLOOD CULTURE RECEIVED NO GROWTH TO DATE CULTURE WILL BE HELD FOR 5 DAYS BEFORE ISSUING A FINAL NEGATIVE REPORT   Report Status PENDING   Incomplete  URINE CULTURE     Status: None   Collection Time    01/20/13  4:06 PM      Result Value Range Status   Specimen Description URINE, CLEAN CATCH   Final   Special Requests Normal   Final   Culture  Setup Time 01/20/2013 16:38   Final   Colony Count NO GROWTH   Final   Culture NO GROWTH   Final   Report Status 01/21/2013 FINAL   Final  MRSA PCR SCREENING     Status: None   Collection Time    01/20/13  5:04 PM      Result Value Range Status    MRSA by PCR NEGATIVE  NEGATIVE Final   Comment:            The GeneXpert MRSA Assay (FDA     approved for NASAL specimens     only), is one component of a     comprehensive MRSA colonization     surveillance program. It is not     intended to diagnose MRSA     infection nor to guide or  monitor treatment for     MRSA infections.     Studies:  Recent x-ray studies have been reviewed in detail by the Attending Physician  Scheduled Meds:  Scheduled Meds: . azithromycin  500 mg Oral Daily  . cefTRIAXone (ROCEPHIN)  IV  1 g Intravenous Daily  . diltiazem  360 mg Oral Daily  . fluticasone  1 spray Each Nare Daily  . glipiZIDE  10 mg Oral Q breakfast  . insulin aspart  0-15 Units Subcutaneous TID WC  . insulin aspart  0-5 Units Subcutaneous QHS  . insulin glargine  30 Units Subcutaneous Q breakfast  . pneumococcal 23 valent vaccine  0.5 mL Intramuscular Tomorrow-1000  . rivaroxaban  20 mg Oral Q supper  . sodium chloride  3 mL Intravenous Q12H  . sodium chloride  3 mL Intravenous Q12H   Continuous Infusions: . sodium chloride 10 mL/hr at 01/21/13 1045    Time spent on care of this patient: 35 min   [facility]  [facility] Office  [phone] Pager - Text Page per Loretha Stapler as per below:  On-Call/Text Page:      [email or web address]      password [email or web address]  If 7PM-7AM, please contact night-coverage [email or web address] Password [email or web address] 01/21/2013, 4:46 PM   LOS: 1 day

## 2013-01-21 NOTE — Progress Notes (Addendum)
Patient: [name] Date of Encounter: 01/21/2013, 11:04 AM Admit date: 01/20/2013     Subjective  Brief summary, [name] is a 42 year old man with HTN, DM, morbid obesity, OSA on CPAP, prior EtOH/cocaine use (currently denies), and PAF starting in 2008 with recurrence in 2009, 2010, and 2011 (the latter 2 recurrences in the setting of + cocaine on UDS). He presented to [facility] today with complaints of dyspnea on exertion and was found to be in rapid atrial fibrillation and fever of 100.4. He reported noncompliance with his medications, and specifically diltiazem, since 01/16/2013.  This AM [name] denies any new complaints.   Objective  Physical Exam: Vitals: BP 136/73  Pulse 81  Temp(Src) 99.1 F (37.3 C) (Oral)  Resp 21  Ht 5' 8.9" (1.75 m)  Wt 328 lb 7.8 oz (149 kg)  BMI 48.65 kg/m2  SpO2 96% General: Well developed, overweight 42 year old male in no acute distress. Neck: Supple. JVD not elevated. Lungs: Diminished breath sounds but clear bilaterally to auscultation without wheezes, rales, or rhonchi. Breathing is unlabored. Heart: Regular S1 S2 without murmur, rub or gallop.  Abdomen: Large but non-distended. Extremities: No clubbing or cyanosis. No edema.   Neuro: Alert and oriented X 3. Moves all extremities spontaneously. No focal deficits.  Intake/Output:  Intake/Output Summary (Last 24 hours) at 01/21/13 1104 Last data filed at 01/21/13 1000  Gross per 24 hour  Intake 4226.83 ml  Output   3125 ml  Net 1101.83 ml    Inpatient Medications:  . aspirin EC  81 mg Oral Daily  . azithromycin  500 mg Oral Daily  . cefTRIAXone (ROCEPHIN)  IV  1 g Intravenous Daily  . fluticasone  1 spray Each Nare Daily  . glipiZIDE  10 mg Oral Q breakfast  . insulin aspart  0-15 Units Subcutaneous TID WC  . insulin aspart  0-5 Units Subcutaneous QHS  . insulin glargine  30 Units Subcutaneous Q breakfast  . pneumococcal 23 valent vaccine  0.5 mL Intramuscular  Tomorrow-1000  . sodium chloride  3 mL Intravenous Q12H  . sodium chloride  3 mL Intravenous Q12H   . sodium chloride 10 mL/hr at 01/21/13 1045  . diltiazem (CARDIZEM) infusion 5 mg/hr (01/21/13 0234)  . heparin 2,450 Units/hr (01/21/13 0330)    Labs:  Recent Labs  01/20/13 0556 01/21/13 0200  NA 133* 137  K 4.2 4.3  CL 98 100  CO2 23 27  GLUCOSE 197* 180*  BUN 12 10  CREATININE 1.00 0.97  CALCIUM 9.0 8.7    Recent Labs  01/20/13 0556  AST 33  ALT 47  ALKPHOS 83  BILITOT 0.7  PROT 7.6  ALBUMIN 3.3*    Recent Labs  01/20/13 0556 01/21/13 0950  WBC 9.6 6.8  NEUTROABS 6.3  --   HGB 15.8 13.2  HCT 43.3 37.2*  MCV 78.9 78.8  PLT 281 239    Recent Labs  01/20/13 0556 01/20/13 1116 01/20/13 1803 01/21/13 0200  TROPONINI <0.30 <0.30 <0.30 <0.30    Recent Labs  01/20/13 1803  HGBA1C 10.0*    Recent Labs  01/21/13 0200  CHOL 135  HDL 20*  LDLCALC 88  TRIG 161  CHOLHDL 6.8    Recent Labs  01/21/13 0200  TSH 1.571    Recent Labs  01/21/13 0200  INR 1.21    Radiology/Studies: Dg Chest Port 1 View  01/20/2013  *RADIOLOGY REPORT*  Clinical Data: Shortness  of breath.  PORTABLE CHEST - 1 VIEW  Comparison: Chest radiograph performed 01/16/2013  Findings: The lungs are well-aerated.  Pulmonary vascularity is at the upper limits of normal.  Mild bibasilar opacities may reflect atelectasis or possibly mild pneumonia.  There is no evidence of pleural effusion or pneumothorax.  The cardiomediastinal silhouette is borderline enlarged.  No acute osseous abnormalities are seen.  IMPRESSION: Mild bibasilar opacities may reflect atelectasis or possibly mild pneumonia.  Borderline cardiomegaly noted.   Original Report Authenticated By: [name], [name]     Echocardiogram: Study Conclusions - Left ventricle: The cavity size was normal. Wall thickness was increased in a pattern of moderate LVH. Systolic function was vigorous. The estimated ejection  fraction was in the range of 65% to 70%. Wall motion was normal; there were no regional wall motion abnormalities. Features are consistent with a pseudonormal left ventricular filling pattern, with concomitant abnormal relaxation and increased filling pressure (grade 2 diastolic dysfunction). - Aortic valve: There was no stenosis. - Aorta: Mildly dilated aortic root and ascending aorta. Ascending aorta dimension: 40 mm. Aortic root dimension: 38mm (ED). - Mitral valve: No significant regurgitation. - Left atrium: The atrium was mildly dilated. - Right ventricle: The cavity size was normal. Systolic function was normal. - Pulmonary arteries: No complete TR doppler jet so unable to estimate PA systolic pressure. - Inferior vena cava: The vessel was normal in size; the respirophasic diameter changes were in the normal range (= 50%); findings are consistent with normal central venous pressure. Impression: - Normal LV size with moderate LV hypertrophy and vigorous systolic function, EF 65-70%. Moderate diastolic dysfunction. Normal RV size and systolic function. No significant valvular abnormalities.   Telemetry: SR    Assessment and Plan  1. Atrial fibrillation with RVR - now in SR; CEs negative; echo shows normal LVEF; LA dimension 41 mm; on IV heparin currently; CHADS2-VASc score is at least 2 so will likely need chronic anticoagulation for embolic prophylaxis; MD to advise 2. History of cocaine/alcohol use - UDS this admission negative  3. Fever with ? pneumonia and abdominal abscess - per primary medical team 4. Recently diagnosed idiopathic hypersomnia, placed on PRN ritalin  5. OSA on CPAP  6. HTN  7. Diabetes mellitus  Dr. Johney Frame to see Signed, EDMISTEN, BROOKE PA-C  I have seen, examined the patient, and reviewed the above assessment and plan.  Changes to above are made where necessary.  The patient has symptomatic paroxysmal atrial fibrillation.  He has multiple  stroke risk factors including DM and HTN.  He should therefore be anticoagulated long term. Today, I discussed novel anticoagulants including pradaxa, xarelto, and eliquis today as indicated for risk reduction in stroke and systemic emboli with nonvalvular atrial fibrillation.  Risks, benefits, and alternatives to were discussed at length today.  I will stop heparin and start xarelto 20mg  daily.  I will also stop IV diltiazem and start po cardizem 360mg  daily.  Weight loss and compliance with CPAP is necessary long term.  OK to transfer to telemetry from my standpoint.  Co Sign: [name], MD 01/21/2013 4:28 PM

## 2013-01-22 LAB — GLUCOSE, CAPILLARY: Glucose-Capillary: 108 mg/dL — ABNORMAL HIGH (ref 70–99)

## 2013-01-22 LAB — CBC
Hemoglobin: 12.9 g/dL — ABNORMAL LOW (ref 13.0–17.0)
MCH: 27.8 pg (ref 26.0–34.0)
MCHC: 35.6 g/dL (ref 30.0–36.0)
Platelets: 274 10*3/uL (ref 150–400)
RBC: 4.64 MIL/uL (ref 4.22–5.81)

## 2013-01-22 MED ORDER — LEVOFLOXACIN 750 MG PO TABS: 750.0000 mg | ORAL_TABLET | Freq: Every day | ORAL | Status: DC

## 2013-01-22 MED ORDER — FLECAINIDE ACETATE 100 MG PO TABS
100.0000 mg | ORAL_TABLET | Freq: Two times a day (BID) | ORAL | Status: DC
  Administered 2013-01-22: 100 mg via ORAL
  Filled 2013-01-22 (×2): qty 1

## 2013-01-22 MED ORDER — FLECAINIDE ACETATE 100 MG PO TABS: 100.0000 mg | ORAL_TABLET | Freq: Two times a day (BID) | ORAL | Status: DC

## 2013-01-22 MED ORDER — RIVAROXABAN 20 MG PO TABS: 20.0000 mg | ORAL_TABLET | Freq: Every day | ORAL | Status: DC

## 2013-01-22 NOTE — Discharge Summary (Signed)
Physician Discharge Summary  Patient ID: [name] MRN: [id number] DOB/AGE: 12-04-1970 42 y.o.  Admit date: 01/20/2013 Discharge date: 01/22/2013  Primary Care Physician:  Katy Apo, MD   Discharge Diagnoses:    Principal Problem:   Atrial fibrillation with RVR Active Problems:   DIABETES MELLITUS, TYPE II   OBESITY, MORBID   COCAINE Abuse, past hx   HYPERTENSION   CAP (community acquired pneumonia)   Chronic diastolic heart failure      Medication List    STOP taking these medications       doxycycline 100 MG capsule  Commonly known as:  VIBRAMYCIN      TAKE these medications       diltiazem 360 MG 24 hr capsule  Commonly known as:  CARDIZEM CD  Take 360 mg by mouth daily.     flecainide 100 MG tablet  Commonly known as:  TAMBOCOR  Take 1 tablet (100 mg total) by mouth every 12 (twelve) hours.     fluticasone 50 MCG/ACT nasal spray  Commonly known as:  FLONASE  Place 1 spray into the nose daily.     glipiZIDE 10 MG 24 hr tablet  Commonly known as:  GLUCOTROL XL  Take 10 mg by mouth Twice daily.     LANTUS SOLOSTAR 100 UNIT/ML injection  Generic drug:  insulin glargine  Inject 30 Units into the skin daily with breakfast.     lisinopril-hydrochlorothiazide 10-12.5 MG per tablet  Commonly known as:  PRINZIDE,ZESTORETIC  Take 1 tablet by mouth daily.     metFORMIN 500 MG tablet  Commonly known as:  GLUCOPHAGE  Take 1,000 mg by mouth 2 (two) times daily.     methylphenidate 20 MG ER tablet  Commonly known as:  METADATE ER  Take 20 mg by mouth daily as needed (ADD).     Rivaroxaban 20 MG Tabs  Commonly known as:  XARELTO  Take 1 tablet (20 mg total) by mouth daily with supper.         Disposition and Follow-up:  Patient will be discharged home today in stable and improved condition. Has been advised to followup with Dr. Johney Frame in 2 weeks.  Consults:  Cardiology Dr. Johney Frame   Significant Diagnostic Studies:  Dg Knee 1-2 Views  Right  01/20/2013  *RADIOLOGY REPORT*  Clinical Data: Right knee pain.  RIGHT KNEE - 1-2 VIEW  Comparison: None.  Findings: There is no evidence of fracture or dislocation.  The joint spaces are preserved.  No significant degenerative change is seen; the patellofemoral joint is grossly unremarkable in appearance.  No significant joint effusion is seen.  The visualized soft tissues are normal in appearance.  IMPRESSION: No evidence of fracture or dislocation.   Original Report Authenticated By: Tonia Ghent, [name]     Brief H and P: For complete details please refer to admission H and P, but in brief patient is a [name] with past medical history significant for paroxysmal atrial fibrillation, hypertension, diabetes mellitus sleep apnea, alcohol&cocaine abuse in the past presents with above complaints. He states that he began having shortness of breath with exertion yesterday and it got worse today. He admits to cold symptoms-nasal congestion but denies cough. He admits to subjective fevers. He denies chest pain and no leg swelling. He was seen in the ED and found to be in atrial fibrillation with rapid ventricular response-ekg was obtained and showed a fib 103 with no acute ischemic changes. She was placed on Cardizem drip  and Cardiology was consulted chest x-ray done in ED showed mild bibasal opacities-atelectasis versus pneumonia, Was also noted to be febrile to 100.4 in ED. he is admitted for further evaluation and management.     Hospital Course:  Principal Problem:   Atrial fibrillation with RVR Active Problems:   DIABETES MELLITUS, TYPE II   OBESITY, MORBID   COCAINE Abuse, past hx   HYPERTENSION   CAP (community acquired pneumonia)   Chronic diastolic heart failure   New onset atrial fibrillation -He is currently less symptomatic with adequate rate control. Medication compliance will be key. -2-D echocardiogram shows a normal left ventricular ejection fraction. -He has been  started on xarelto for stroke prophylaxis. -Cardiology has also started him on Cardizem 360 mg daily as well as flecainide 100 mg twice daily.  CAP -Will be discharged on 5 more days of Levaquin.  Obstructive sleep apnea -Continue nightly CPAP.  Diabetes mellitus -Well controlled. -Continue Lantus, glipizide, metformin.  Time spent on Discharge: Greater than 30 minutes  Signed: Chaya Jan [facility] Pager: [phone] 01/22/2013, 4:54 PM

## 2013-01-22 NOTE — Progress Notes (Signed)
Patient: [name] Date of Encounter: 01/22/2013, 9:08 AM Admit date: 01/20/2013     Subjective  Brief summary, [name] is a 42 year old man with HTN, DM, morbid obesity, OSA on CPAP, prior EtOH/cocaine use (currently denies), and PAF starting in 2008 with recurrence in 2009, 2010, and 2011 (the latter 2 recurrences in the setting of + cocaine on UDS). He presented to [facility] today with complaints of dyspnea on exertion and was found to be in rapid atrial fibrillation and fever of 100.4. He reported noncompliance with his medications, and specifically diltiazem, since 01/16/2013.  This AM [name] denies any new complaints.  He is back in afib today but unaware.   Objective  Physical Exam: Vitals: BP 136/74  Pulse 96  Temp(Src) 98.7 F (37.1 C) (Oral)  Resp 20  Ht 5\' 9"  (1.753 m)  Wt 317 lb 6.4 oz (143.972 kg)  BMI 46.85 kg/m2  SpO2 97% General: Well developed, overweight 42 year old male in no acute distress. Neck: Supple. JVD not elevated. Lungs: Diminished breath sounds but clear bilaterally to auscultation without wheezes, rales, or rhonchi. Breathing is unlabored. Heart: iRRR Abdomen: Large but non-distended. Extremities: No clubbing or cyanosis. No edema.   Neuro: Alert and oriented X 3. Moves all extremities spontaneously. No focal deficits.  Intake/Output:  Intake/Output Summary (Last 24 hours) at 01/22/13 0908 Last data filed at 01/22/13 0506  Gross per 24 hour  Intake 1239.5 ml  Output    450 ml  Net  789.5 ml    Inpatient Medications:  . azithromycin  500 mg Oral Daily  . cefTRIAXone (ROCEPHIN)  IV  1 g Intravenous Daily  . diltiazem  360 mg Oral Daily  . flecainide  100 mg Oral Q12H  . fluticasone  1 spray Each Nare Daily  . glipiZIDE  10 mg Oral Q breakfast  . insulin aspart  0-15 Units Subcutaneous TID WC  . insulin aspart  0-5 Units Subcutaneous QHS  . insulin glargine  30 Units Subcutaneous Q breakfast  . pneumococcal 23 valent  vaccine  0.5 mL Intramuscular Tomorrow-1000  . rivaroxaban  20 mg Oral Q supper  . sodium chloride  3 mL Intravenous Q12H  . sodium chloride  3 mL Intravenous Q12H   . sodium chloride Stopped (01/21/13 1652)    Labs:  Recent Labs  01/20/13 0556 01/21/13 0200  NA 133* 137  K 4.2 4.3  CL 98 100  CO2 23 27  GLUCOSE 197* 180*  BUN 12 10  CREATININE 1.00 0.97  CALCIUM 9.0 8.7    Recent Labs  01/20/13 0556  AST 33  ALT 47  ALKPHOS 83  BILITOT 0.7  PROT 7.6  ALBUMIN 3.3*    Recent Labs  01/20/13 0556 01/21/13 0950 01/22/13 0440  WBC 9.6 6.8 7.7  NEUTROABS 6.3  --   --   HGB 15.8 13.2 12.9*  HCT 43.3 37.2* 36.2*  MCV 78.9 78.8 78.0  PLT 281 239 274    Recent Labs  01/20/13 0556 01/20/13 1116 01/20/13 1803 01/21/13 0200  TROPONINI <0.30 <0.30 <0.30 <0.30    Recent Labs  01/20/13 1803  HGBA1C 10.0*    Recent Labs  01/21/13 0200  CHOL 135  HDL 20*  LDLCALC 88  TRIG 409  CHOLHDL 6.8    Recent Labs  01/21/13 0200  TSH 1.571    Recent Labs  01/21/13 0200  INR 1.21    Radiology/Studies: Dg Chest [place]  1 View  01/20/2013  *RADIOLOGY REPORT*  Clinical Data: Shortness of breath.  PORTABLE CHEST - 1 VIEW  Comparison: Chest radiograph performed 01/16/2013  Findings: The lungs are well-aerated.  Pulmonary vascularity is at the upper limits of normal.  Mild bibasilar opacities may reflect atelectasis or possibly mild pneumonia.  There is no evidence of pleural effusion or pneumothorax.  The cardiomediastinal silhouette is borderline enlarged.  No acute osseous abnormalities are seen.  IMPRESSION: Mild bibasilar opacities may reflect atelectasis or possibly mild pneumonia.  Borderline cardiomegaly noted.   Original Report Authenticated By: Tonia Ghent, [name]     Echocardiogram: Study Conclusions - Left ventricle: The cavity size was normal. Wall thickness was increased in a pattern of moderate LVH. Systolic function was vigorous. The estimated  ejection fraction was in the range of 65% to 70%. Wall motion was normal; there were no regional wall motion abnormalities. Features are consistent with a pseudonormal left ventricular filling pattern, with concomitant abnormal relaxation and increased filling pressure (grade 2 diastolic dysfunction). - Aortic valve: There was no stenosis. - Aorta: Mildly dilated aortic root and ascending aorta. Ascending aorta dimension: 40 mm. Aortic root dimension: 38mm (ED). - Mitral valve: No significant regurgitation. - Left atrium: The atrium was mildly dilated. - Right ventricle: The cavity size was normal. Systolic function was normal. - Pulmonary arteries: No complete TR doppler jet so unable to estimate PA systolic pressure. - Inferior vena cava: The vessel was normal in size; the respirophasic diameter changes were in the normal range (= 50%); findings are consistent with normal central venous pressure. Impression: - Normal LV size with moderate LV hypertrophy and vigorous systolic function, EF 65-70%. Moderate diastolic dysfunction. Normal RV size and systolic function. No significant valvular abnormalities.   Telemetry: SR--> afib this am    Assessment and Plan  1. Atrial fibrillation- presently less symptomatic with adequate rate control, he needs to be compliant with medicine; echo shows normal LVEF; LA dimension 41 mm;  xarelto 20mg  daily cardizem 360mg  daily Add flecainide 100mg  BID at this time 2. History of cocaine/alcohol use - UDS this admission negative  3. Fever with ? pneumonia and abdominal abscess - per primary medical team 4. Recently diagnosed idiopathic hypersomnia, placed on PRN ritalin  5. OSA on CPAP  6. HTN  7. Diabetes mellitus  Could be discharged if other medical issues are stable.  Follow-up with Tereso Newcomer in my office in 2 weeks.  If he is in sinus at that point, then he will need a GXT scheduled to exclude ischemia/ exercise induced  arrhythmias.   [name], MD 01/22/2013 9:08 AM

## 2013-01-22 NOTE — ED Provider Notes (Signed)
Medical screening examination/treatment/procedure(s) were performed by non-physician practitioner and as supervising physician I was immediately available for consultation/collaboration.   Zyir Gassert E Bart Ashford, MD 01/22/13 2125 

## 2013-01-22 NOTE — Progress Notes (Signed)
Pt. Stated that he would place himself on CPAP when ready for bed. CPAP is set up at pt.'s bedside. Pt. Was made aware to call RT anytime during the night if he needed assistance with CPAP.

## 2013-01-23 ENCOUNTER — Emergency Department (HOSPITAL_COMMUNITY): Payer: 59

## 2013-01-23 ENCOUNTER — Inpatient Hospital Stay (HOSPITAL_COMMUNITY)
Admission: EM | Admit: 2013-01-23 | Discharge: 2013-01-23 | DRG: 292 | Disposition: A | Discharge status: Home / Self Care | Payer: 59 | Attending: Internal Medicine | Admitting: Internal Medicine

## 2013-01-23 ENCOUNTER — Encounter (HOSPITAL_COMMUNITY): Payer: Self-pay | Admitting: Emergency Medicine

## 2013-01-23 DIAGNOSIS — Z9119 Patient's noncompliance with other medical treatment and regimen: Secondary | ICD-10-CM

## 2013-01-23 DIAGNOSIS — J3089 Other allergic rhinitis: Secondary | ICD-10-CM

## 2013-01-23 DIAGNOSIS — E118 Type 2 diabetes mellitus with unspecified complications: Secondary | ICD-10-CM | POA: Diagnosis present

## 2013-01-23 DIAGNOSIS — E785 Hyperlipidemia, unspecified: Secondary | ICD-10-CM

## 2013-01-23 DIAGNOSIS — I480 Paroxysmal atrial fibrillation: Secondary | ICD-10-CM

## 2013-01-23 DIAGNOSIS — I119 Hypertensive heart disease without heart failure: Secondary | ICD-10-CM | POA: Diagnosis present

## 2013-01-23 DIAGNOSIS — I4891 Unspecified atrial fibrillation: Secondary | ICD-10-CM

## 2013-01-23 DIAGNOSIS — R195 Other fecal abnormalities: Secondary | ICD-10-CM

## 2013-01-23 DIAGNOSIS — J4489 Other specified chronic obstructive pulmonary disease: Secondary | ICD-10-CM | POA: Diagnosis present

## 2013-01-23 DIAGNOSIS — Z720 Tobacco use: Secondary | ICD-10-CM

## 2013-01-23 DIAGNOSIS — K921 Melena: Secondary | ICD-10-CM

## 2013-01-23 DIAGNOSIS — M25511 Pain in right shoulder: Secondary | ICD-10-CM

## 2013-01-23 DIAGNOSIS — G4710 Hypersomnia, unspecified: Secondary | ICD-10-CM

## 2013-01-23 DIAGNOSIS — G4733 Obstructive sleep apnea (adult) (pediatric): Secondary | ICD-10-CM

## 2013-01-23 DIAGNOSIS — Z91199 Patient's noncompliance with other medical treatment and regimen due to unspecified reason: Secondary | ICD-10-CM

## 2013-01-23 DIAGNOSIS — Z79899 Other long term (current) drug therapy: Secondary | ICD-10-CM

## 2013-01-23 DIAGNOSIS — I10 Essential (primary) hypertension: Secondary | ICD-10-CM

## 2013-01-23 DIAGNOSIS — F1410 Cocaine abuse, uncomplicated: Secondary | ICD-10-CM

## 2013-01-23 DIAGNOSIS — I5032 Chronic diastolic (congestive) heart failure: Secondary | ICD-10-CM

## 2013-01-23 DIAGNOSIS — I5033 Acute on chronic diastolic (congestive) heart failure: Principal | ICD-10-CM | POA: Diagnosis present

## 2013-01-23 DIAGNOSIS — I509 Heart failure, unspecified: Secondary | ICD-10-CM

## 2013-01-23 DIAGNOSIS — J449 Chronic obstructive pulmonary disease, unspecified: Secondary | ICD-10-CM | POA: Diagnosis present

## 2013-01-23 DIAGNOSIS — Z8679 Personal history of other diseases of the circulatory system: Secondary | ICD-10-CM

## 2013-01-23 DIAGNOSIS — F1010 Alcohol abuse, uncomplicated: Secondary | ICD-10-CM | POA: Diagnosis present

## 2013-01-23 DIAGNOSIS — E662 Morbid (severe) obesity with alveolar hypoventilation: Secondary | ICD-10-CM | POA: Diagnosis present

## 2013-01-23 DIAGNOSIS — R0600 Dyspnea, unspecified: Secondary | ICD-10-CM | POA: Diagnosis present

## 2013-01-23 DIAGNOSIS — J302 Other seasonal allergic rhinitis: Secondary | ICD-10-CM

## 2013-01-23 DIAGNOSIS — E119 Type 2 diabetes mellitus without complications: Secondary | ICD-10-CM

## 2013-01-23 DIAGNOSIS — J189 Pneumonia, unspecified organism: Secondary | ICD-10-CM

## 2013-01-23 LAB — PROTIME-INR: INR: 1.3 (ref 0.00–1.49)

## 2013-01-23 LAB — BASIC METABOLIC PANEL
BUN: 9 mg/dL (ref 6–23)
Chloride: 97 mEq/L (ref 96–112)
Creatinine, Ser: 0.8 mg/dL (ref 0.50–1.35)
Glucose, Bld: 111 mg/dL — ABNORMAL HIGH (ref 70–99)
Potassium: 3.9 mEq/L (ref 3.5–5.1)

## 2013-01-23 LAB — RAPID URINE DRUG SCREEN, HOSP PERFORMED
Amphetamines: NOT DETECTED
Cocaine: NOT DETECTED
Opiates: NOT DETECTED
Tetrahydrocannabinol: NOT DETECTED

## 2013-01-23 LAB — CBC
HCT: 37.3 % — ABNORMAL LOW (ref 39.0–52.0)
Hemoglobin: 13.7 g/dL (ref 13.0–17.0)
MCV: 78.5 fL (ref 78.0–100.0)
RDW: 13.2 % (ref 11.5–15.5)
WBC: 11.1 10*3/uL — ABNORMAL HIGH (ref 4.0–10.5)

## 2013-01-23 LAB — POCT I-STAT TROPONIN I: Troponin i, poc: 0 ng/mL (ref 0.00–0.08)

## 2013-01-23 LAB — TROPONIN I: Troponin I: <0.3 ng/mL (ref <0.30)

## 2013-01-23 LAB — GLUCOSE, CAPILLARY

## 2013-01-23 IMAGING — CR DG CHEST 2V
2 series · 2 of 2 positions shown · non-contrast
Comparison: Portable chest x-ray of [DATE]

CLINICAL DATA: Shortness of breath, palpitations

CHEST - 2 VIEW

[w chest pa]
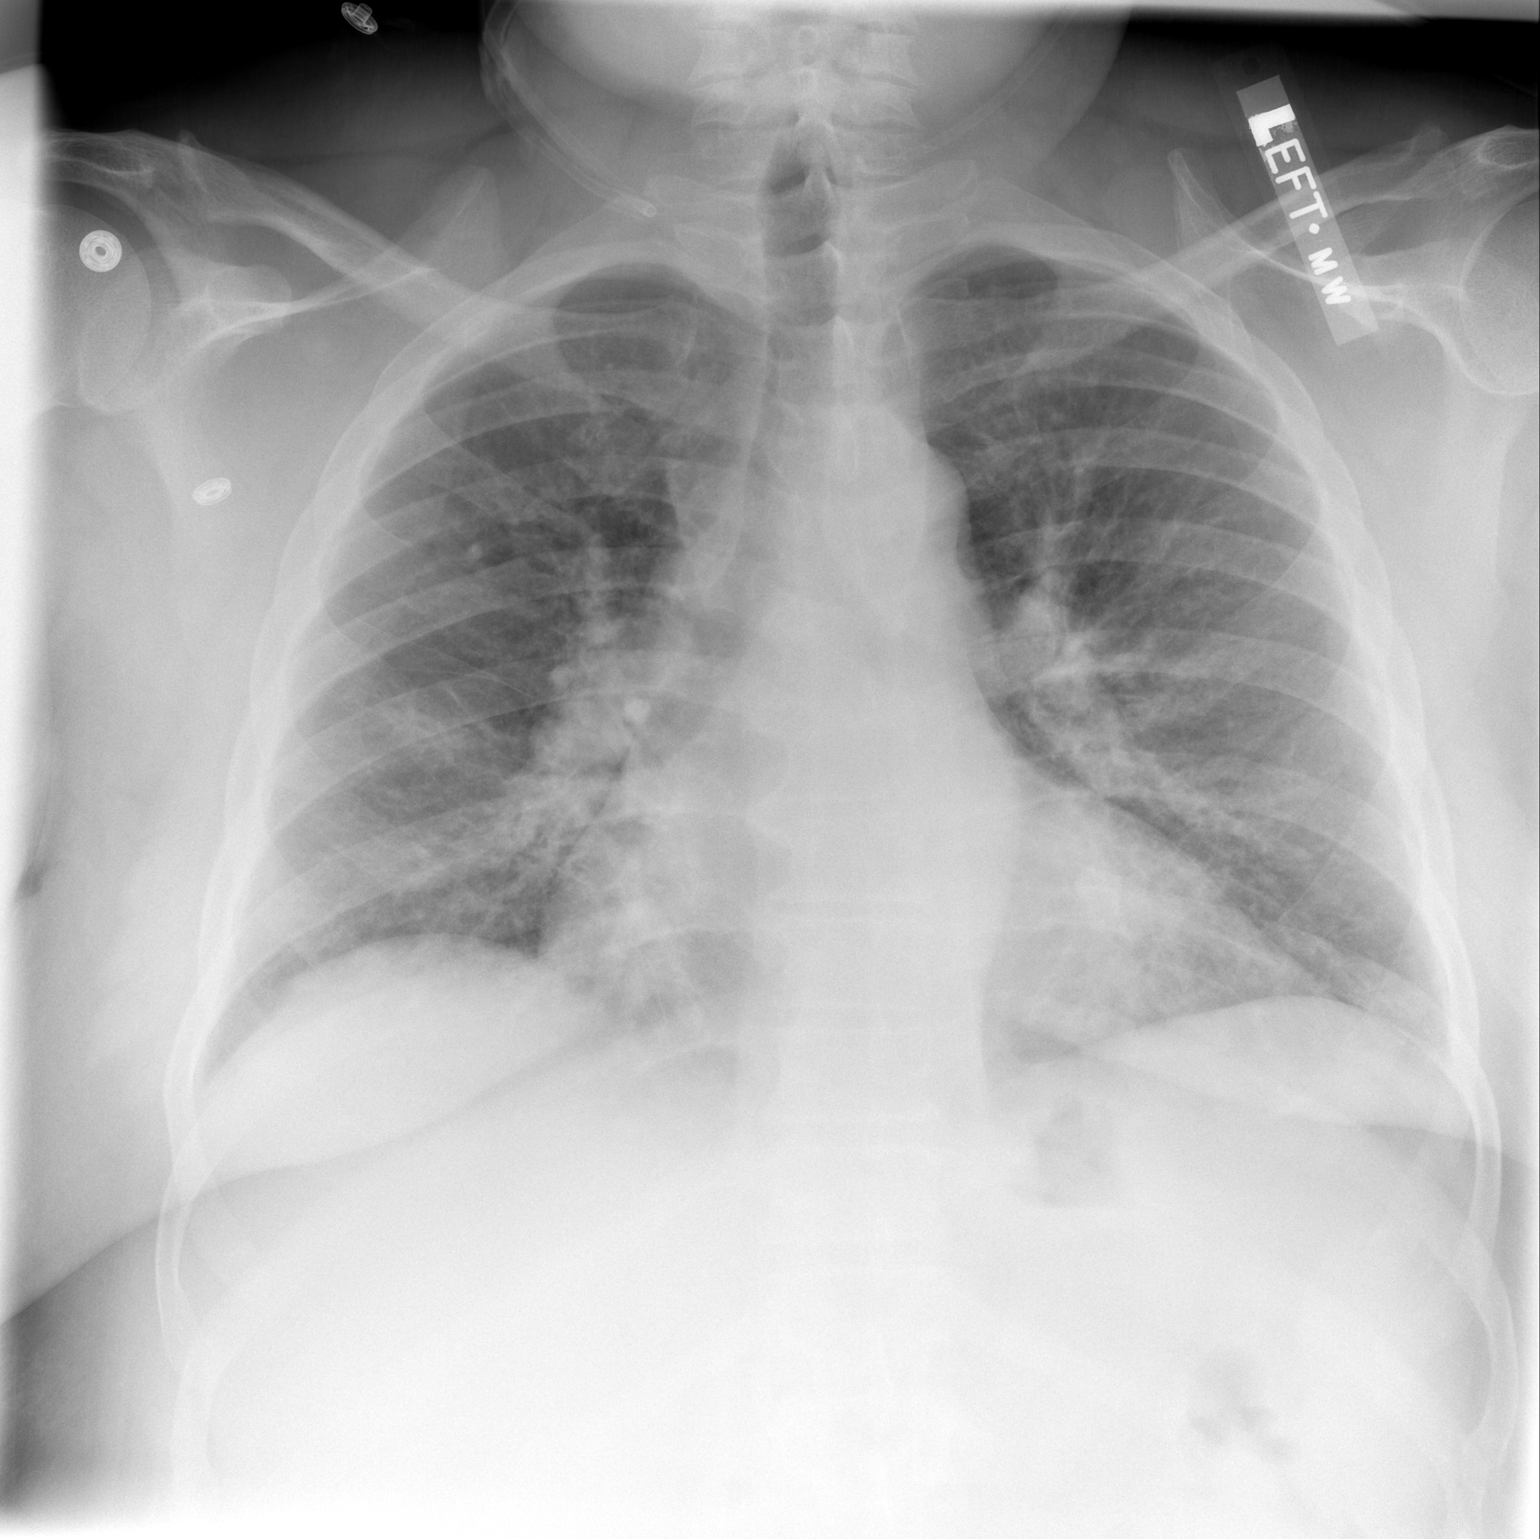

[w chest lat]
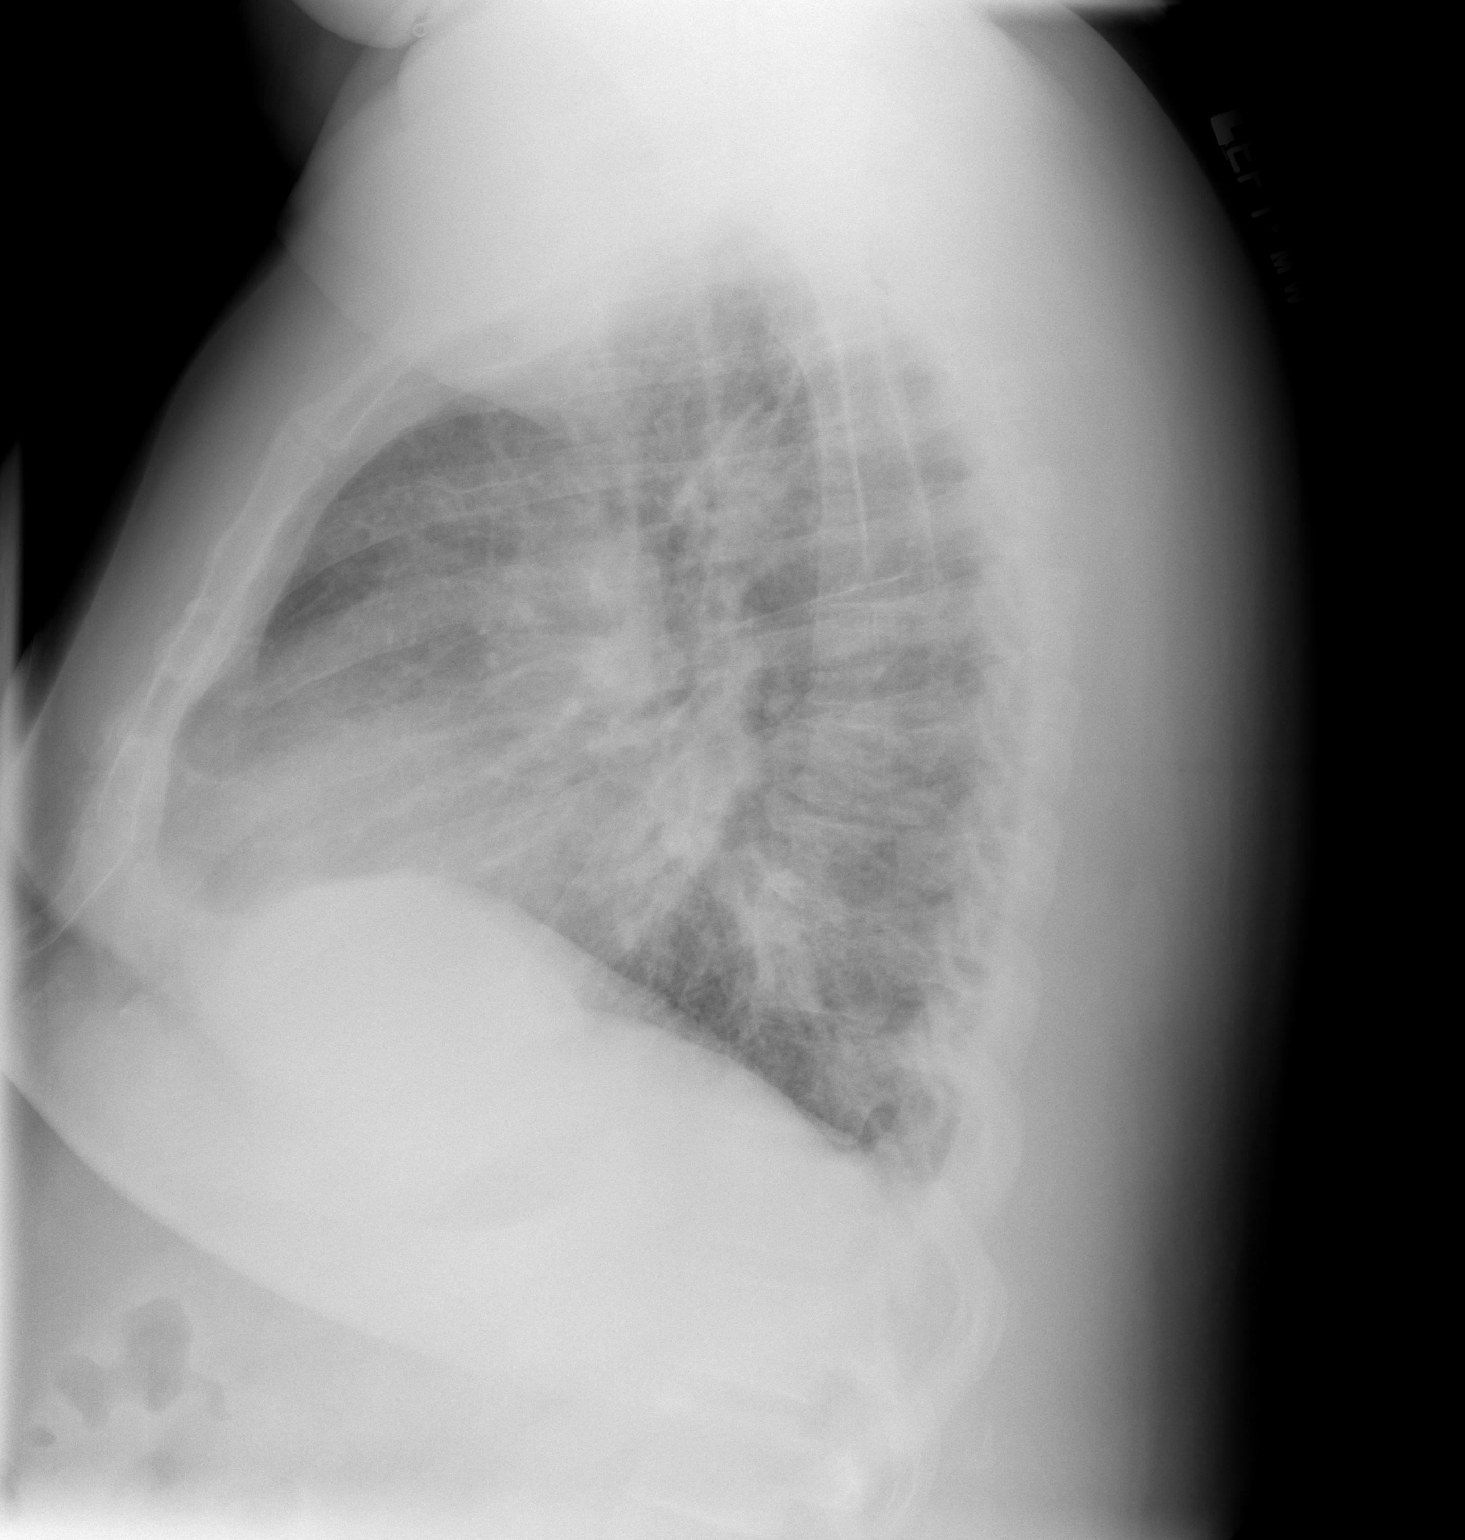

[2 of 2 positions shown; findings below may reference images not displayed]

FINDINGS: There are prominent perihilar structures some of which is
due to a possible bronchitis, but mild pulmonary vascular
congestion cannot be excluded.  No effusion is seen. The heart is
mildly enlarged.  There are degenerative changes throughout the
thoracic spine.
IMPRESSION: Cardiomegaly prominent perihilar markings consistent with
bronchitis and possibly mild pulmonary vascular congestion.
Consider follow-up.

## 2013-01-23 MED ORDER — SODIUM CHLORIDE 0.9 % IJ SOLN: 3.0000 mL | INTRAMUSCULAR | Status: DC | PRN

## 2013-01-23 MED ORDER — INSULIN ASPART 100 UNIT/ML ~~LOC~~ SOLN: 0.0000 [IU] | Freq: Three times a day (TID) | SUBCUTANEOUS | Status: DC

## 2013-01-23 MED ORDER — LISINOPRIL 10 MG PO TABS
10.0000 mg | ORAL_TABLET | Freq: Every day | ORAL | Status: DC
Start: 2013-01-23 — End: 2013-01-23
  Administered 2013-01-23: 10 mg via ORAL
  Filled 2013-01-23: qty 1

## 2013-01-23 MED ORDER — IPRATROPIUM BROMIDE 0.02 % IN SOLN: 0.5000 mg | Freq: Four times a day (QID) | RESPIRATORY_TRACT | Status: DC

## 2013-01-23 MED ORDER — RIVAROXABAN 20 MG PO TABS
20.0000 mg | ORAL_TABLET | Freq: Every day | ORAL | Status: DC
  Administered 2013-01-23: 20 mg via ORAL
  Filled 2013-01-23: qty 1

## 2013-01-23 MED ORDER — FLECAINIDE ACETATE 100 MG PO TABS
100.0000 mg | ORAL_TABLET | Freq: Once | ORAL | Status: AC
  Administered 2013-01-23: 100 mg via ORAL
  Filled 2013-01-23: qty 1

## 2013-01-23 MED ORDER — FUROSEMIDE 10 MG/ML IJ SOLN
40.0000 mg | Freq: Once | INTRAMUSCULAR | Status: AC
  Administered 2013-01-23: 40 mg via INTRAVENOUS
  Filled 2013-01-23: qty 4

## 2013-01-23 MED ORDER — METHYLPHENIDATE HCL ER 20 MG PO TBCR: 20.0000 mg | EXTENDED_RELEASE_TABLET | Freq: Every day | ORAL | Status: DC | PRN

## 2013-01-23 MED ORDER — FLUTICASONE PROPIONATE 50 MCG/ACT NA SUSP
1.0000 | Freq: Every day | NASAL | Status: DC
  Filled 2013-01-23: qty 16

## 2013-01-23 MED ORDER — LEVALBUTEROL HCL 0.63 MG/3ML IN NEBU
0.6300 mg | INHALATION_SOLUTION | Freq: Four times a day (QID) | RESPIRATORY_TRACT | Status: DC
  Filled 2013-01-23 (×3): qty 3

## 2013-01-23 MED ORDER — DILTIAZEM HCL ER COATED BEADS 360 MG PO CP24: 360.0000 mg | ORAL_CAPSULE | Freq: Every day | ORAL | Status: DC

## 2013-01-23 MED ORDER — GLIPIZIDE ER 10 MG PO TB24
10.0000 mg | ORAL_TABLET | Freq: Every day | ORAL | Status: DC
  Filled 2013-01-23: qty 1

## 2013-01-23 MED ORDER — LEVOFLOXACIN 750 MG PO TABS
750.0000 mg | ORAL_TABLET | Freq: Every day | ORAL | Status: DC
  Administered 2013-01-23: 750 mg via ORAL
  Filled 2013-01-23: qty 1

## 2013-01-23 MED ORDER — FLECAINIDE ACETATE 100 MG PO TABS: 100.0000 mg | ORAL_TABLET | Freq: Once | ORAL | Status: DC

## 2013-01-23 MED ORDER — ACETAMINOPHEN 325 MG PO TABS: 650.0000 mg | ORAL_TABLET | Freq: Four times a day (QID) | ORAL | Status: DC | PRN

## 2013-01-23 MED ORDER — SODIUM CHLORIDE 0.9 % IJ SOLN: 3.0000 mL | Freq: Two times a day (BID) | INTRAMUSCULAR | Status: DC

## 2013-01-23 MED ORDER — SODIUM CHLORIDE 0.9 % IV SOLN: 250.0000 mL | INTRAVENOUS | Status: DC | PRN

## 2013-01-23 MED ORDER — LEVOFLOXACIN 750 MG PO TABS: 750.0000 mg | ORAL_TABLET | Freq: Every day | ORAL | Status: DC

## 2013-01-23 MED ORDER — ONDANSETRON HCL 4 MG/2ML IJ SOLN: 4.0000 mg | Freq: Four times a day (QID) | INTRAMUSCULAR | Status: DC | PRN

## 2013-01-23 MED ORDER — MORPHINE SULFATE 2 MG/ML IJ SOLN: 2.0000 mg | INTRAMUSCULAR | Status: DC | PRN

## 2013-01-23 MED ORDER — ACETAMINOPHEN 650 MG RE SUPP: 650.0000 mg | Freq: Four times a day (QID) | RECTAL | Status: DC | PRN

## 2013-01-23 MED ORDER — ONDANSETRON HCL 8 MG PO TABS: 4.0000 mg | ORAL_TABLET | Freq: Four times a day (QID) | ORAL | Status: DC | PRN

## 2013-01-23 MED ORDER — OXYCODONE HCL 5 MG PO TABS: 5.0000 mg | ORAL_TABLET | ORAL | Status: DC | PRN

## 2013-01-23 MED ORDER — FUROSEMIDE 10 MG/ML IJ SOLN: 40.0000 mg | Freq: Two times a day (BID) | INTRAMUSCULAR | Status: DC

## 2013-01-23 MED ORDER — DILTIAZEM HCL ER COATED BEADS 360 MG PO CP24
360.0000 mg | ORAL_CAPSULE | Freq: Once | ORAL | Status: AC
  Administered 2013-01-23: 360 mg via ORAL
  Filled 2013-01-23: qty 1

## 2013-01-23 MED ORDER — NITROGLYCERIN 2 % TD OINT
1.0000 [in_us] | TOPICAL_OINTMENT | Freq: Once | TRANSDERMAL | Status: AC
  Administered 2013-01-23: 1 [in_us] via TOPICAL
  Filled 2013-01-23: qty 1

## 2013-01-23 MED ORDER — SODIUM CHLORIDE 0.9 % IV SOLN: INTRAVENOUS | Status: DC

## 2013-01-23 MED ORDER — INSULIN GLARGINE 100 UNIT/ML ~~LOC~~ SOLN
30.0000 [IU] | Freq: Every day | SUBCUTANEOUS | Status: DC
  Filled 2013-01-23: qty 0.3

## 2013-01-23 MED ORDER — ASPIRIN EC 81 MG PO TBEC
81.0000 mg | DELAYED_RELEASE_TABLET | Freq: Every day | ORAL | Status: DC
  Filled 2013-01-23: qty 1

## 2013-01-23 NOTE — H&P (Signed)
PCP:   Katy Apo, MD   Chief Complaint:  Shortness of breath.   HPI: This is a 42 year old male, with known history of HTN, DM, OSA, Morbid obesity, substance abuse (ETOH/Cocaine), PAF, s/p appendectomy, hospitalized 01/20/13-01/22/13 for rapid atrial, and CAP. He now returns to the ED with progressive SOBOE, without chest pain or cough, which was evident on getting home from hospital, and has become progressively worse. He attempted to fill his discharge medication at the pharmacy on 01/22/13, but was unable to stand in line, as he felt so unwell. He had a very restless night, and was unable to sleep. this AM, he is SOB even at rest, worse on exertion. His father brought him back to the ED.   Allergies:   Allergies  Allergen Reactions  . Shrimp (Shellfish Allergy) Anaphylaxis  . Sulfa Antibiotics Itching  . Watermelon Flavor Nausea And Vomiting      Past Medical History  Diagnosis Date  . Hypertension   . Diabetes mellitus   . Sleep apnea   . Morbid obesity   . History of cocaine use   . History of alcohol abuse   . Paroxysmal atrial fibrillation     Occurring in 2008, with several recurrence since then (including in the setting of + cocaine on UDS).    Past Surgical History  Procedure Laterality Date  . Appendectomy    . Rotator cuff repair    . Tonsillectomy      Prior to Admission medications   Medication Sig Start Date End Date Taking? Authorizing Provider  diltiazem (CARDIZEM CD) 360 MG 24 hr capsule Take 360 mg by mouth daily. 02/28/11  Yes Historical Provider, MD  fluticasone (FLONASE) 50 MCG/ACT nasal spray Place 1 spray into the nose daily. 10/21/12 10/21/13 Yes Clinton D Young, MD  glipiZIDE (GLUCOTROL) 10 MG 24 hr tablet Take 10 mg by mouth Twice daily. 02/28/11  Yes Historical Provider, MD  LANTUS SOLOSTAR 100 UNIT/ML injection Inject 30 Units into the skin daily with breakfast.  04/22/12  Yes Historical Provider, MD  lisinopril-hydrochlorothiazide  (PRINZIDE,ZESTORETIC) 10-12.5 MG per tablet Take 1 tablet by mouth daily.   Yes Historical Provider, MD  metFORMIN (GLUCOPHAGE) 500 MG tablet Take 1,000 mg by mouth 2 (two) times daily.    Yes Historical Provider, MD  methylphenidate (METADATE ER) 20 MG ER tablet Take 20 mg by mouth daily as needed (ADD).  01/14/13  Yes Waymon Budge, MD    Social History: Patient reports that he has been smoking.  His smokeless tobacco use includes Snuff. He reports that  drinks alcohol. He reports that he does not use illicit drugs.  Family History  Problem Relation Age of Onset  . Heart attack Father   . Diabetes Mother     Review of Systems:  As per HPI and chief complaint. Patent admits to fatigue, diminished appetite, but no weight loss, fever, chills, headache, blurred vision, difficulty in speaking, dysphagia, chest pain, cough, orthopnea, paroxysmal nocturnal dyspnea, nausea, diaphoresis, abdominal pain, vomiting, diarrhea, belching, heartburn, hematemesis, melena, dysuria, nocturia, urinary frequency, hematochezia, lower extremity swelling, pain, or redness. The rest of the systems review is negative.  Physical Exam:  General:  Patient does not appear to be in obvious acute distress. Alert, communicative, fully oriented, talking in complete sentences, mildly short of breath at rest.  HEENT:  No clinical pallor, no jaundice, no conjunctival injection or discharge. NECK:  Supple, JVP not seen, no carotid bruits, no palpable lymphadenopathy, no palpable  goiter. CHEST:  Clinically clear to auscultation, no wheezes, no crackles. HEART:  Sounds 1 and 2 heard, normal, regular, no murmurs. ABDOMEN:  Morbidly obese, soft, non-tender, no palpable organomegaly, no palpable masses, normal bowel sounds. GENITALIA:  Not examined. LOWER EXTREMITIES:  No pitting edema, palpable peripheral pulses. MUSCULOSKELETAL SYSTEM:  Unremarkable. CENTRAL NERVOUS SYSTEM:  No focal neurologic deficit on gross  examination.  Labs on Admission:  Results for orders placed during the hospital encounter of 01/23/13 (from the past 48 hour(s))  CBC     Status: Abnormal   Collection Time    01/23/13  8:18 AM      Result Value Range   WBC 11.1 (*) 4.0 - 10.5 K/uL   RBC 4.75  4.22 - 5.81 MIL/uL   Hemoglobin 13.7  13.0 - 17.0 g/dL   HCT 40.9 (*) 81.1 - 91.4 %   MCV 78.5  78.0 - 100.0 fL   MCH 28.8  26.0 - 34.0 pg   MCHC 36.7 (*) 30.0 - 36.0 g/dL   RDW 78.2  95.6 - 21.3 %   Platelets 285  150 - 400 K/uL  BASIC METABOLIC PANEL     Status: Abnormal   Collection Time    01/23/13  8:18 AM      Result Value Range   Sodium 134 (*) 135 - 145 mEq/L   Potassium 3.9  3.5 - 5.1 mEq/L   Chloride 97  96 - 112 mEq/L   CO2 24  19 - 32 mEq/L   Glucose, Bld 111 (*) 70 - 99 mg/dL   BUN 9  6 - 23 mg/dL   Creatinine, Ser 0.86  0.50 - 1.35 mg/dL   Calcium 9.1  8.4 - 57.8 mg/dL   GFR calc non Af Amer >90  >90 mL/min   GFR calc Af Amer >90  >90 mL/min   Comment:            The eGFR has been calculated     using the CKD EPI equation.     This calculation has not been     validated in all clinical     situations.     eGFR's persistently     <90 mL/min signify     possible Chronic Kidney Disease.  PRO B NATRIURETIC PEPTIDE     Status: Abnormal   Collection Time    01/23/13  8:18 AM      Result Value Range   Pro B Natriuretic peptide (BNP) 1764.0 (*) 0 - 125 pg/mL  PROTIME-INR     Status: Abnormal   Collection Time    01/23/13  8:18 AM      Result Value Range   Prothrombin Time 15.9 (*) 11.6 - 15.2 seconds   INR 1.30  0.00 - 1.49  TROPONIN I     Status: None   Collection Time    01/23/13  8:18 AM      Result Value Range   Troponin I <0.30  <0.30 ng/mL   Comment:            Due to the release kinetics of cTnI,     a negative result within the first hours     of the onset of symptoms does not rule out     myocardial infarction with certainty.     If myocardial infarction is still suspected,     repeat  the test at appropriate intervals.  POCT I-STAT TROPONIN I     Status: None  Collection Time    01/23/13  8:40 AM      Result Value Range   Troponin i, poc 0.00  0.00 - 0.08 ng/mL   Comment 3            Comment: Due to the release kinetics of cTnI,     a negative result within the first hours     of the onset of symptoms does not rule out     myocardial infarction with certainty.     If myocardial infarction is still suspected,     repeat the test at appropriate intervals.    Radiological Exams on Admission: Dg Chest 2 View  01/23/2013  *RADIOLOGY REPORT*  Clinical Data: Shortness of breath, palpitations  CHEST - 2 VIEW  Comparison: Portable chest x-ray of 01/20/2013  Findings: There are prominent perihilar structures some of which is due to a possible bronchitis, but mild pulmonary vascular congestion cannot be excluded.  No effusion is seen. The heart is mildly enlarged.  There are degenerative changes throughout the thoracic spine.  IMPRESSION: Cardiomegaly prominent perihilar markings consistent with bronchitis and possibly mild pulmonary vascular congestion. Consider follow-up.   Original Report Authenticated By: Dwyane Dee, [name]     Assessment/Plan Active Problems:    1. CHF (congestive heart failure): Patient presented with SOBOE, which has been progressive, since discharge on 01/22/13. CXR revealed Cardiomegaly prominent perihilar markings and possibly mild pulmonary vascular congestion. ProBNP is mildly elevated at 1764. Clinically, this appear consistent with mild acute decompensation of diastolic CHF. Of note, 2D Echocardiogram of 01/20/13, revealed EF of 65% to 70%. Will admit to telemetry, manage with iv lasix, and follow ProBNP. He has cardiology follow up with Dr Johney Frame in 2 weeks. Will inform [place] cardiology, of re-admission.  2. Dyspnea: This is multifactorial, secondary to CHF decompensation, superimposed on OSA/OHS, possible COPD and patient may have super-added  deconditioning. Treatment will be directed to specific etiologies. Patient will benefit from PT/OT. We shall also utilize bronchodilators. Doubt PE, as patient is anticoagulated.  3. PAF (paroxysmal atrial fibrillation: Patient is currently in SR, and rate-controlled, on Cardizem CD, which we shall continue. Anticoagulation will be continued.  4. OSA (obstructive sleep apnea): Continue nocturnal CPAP.  5. DM: Patient has insulin-requiring type 2 DM, which appears reasonably controlled, based on random blood glucose of 111 will continue Glipizide, Lantus, and add SSI. Metformin will be placed on hold, due to CHF.  6. HTN (hypertension): Controlled.  7. Tobacco abuse/Substance abuse: Counseled. Will check UDS.  8. Recent CAP: Patient was treated for community -acquired pneumonia during his last hospitalization. He has no chest pain or productive cough, is apyrexial, and CXR is devoid of pneumonic consolidation. Will complete antibiotic therapy with a five day course of Levaquin.   Further management will depend on clinical course.   Comment: patient is FULL CODE.     Time Spent on Admission: 45 mins.   Maitland Lesiak,CHRISTOPHER 01/23/2013, 10:39 AM

## 2013-01-23 NOTE — ED Notes (Signed)
Admitting physician at bedside

## 2013-01-23 NOTE — ED Notes (Signed)
Pt given ice water.

## 2013-01-23 NOTE — ED Notes (Signed)
Report to James, RN.

## 2013-01-23 NOTE — Progress Notes (Signed)
Patient ID: [name], male   DOB: Apr 29, 1971, 42 y.o.   MRN: [id number] I spent 30 minutes reviewing all his medical records including consultation by Dr. Johney Frame. His noncompliance has led to him coming back to the emergency room 24 hours after discharge. He received 40 mg of IV Lasix. He's laying flat and sats are 98% on 2 L. He is in atrial fib with a well-controlled ventricular rate. He was supposed to go home on flecainide 100 mg by mouth twice a day.  I discussed this with Dr. Nyra Jabs and nursing staff. We will give him 100 mg of flecainide now and also his diltiazem 360 mg. We'll also give him his anticoagulant. We'll plan on discharge later this afternoon from the ED. He does not need to be her admission and this should be reversed.  We'll also arrange for care management to see him prior to leaving the emergency room. He will need transition at home to ensure he gets his medications filled. He will also need to make sure he follows up in our cardiology office in less than 2 weeks.  If he presents to the emergency room again with a similar scenario, he should not be readmitted. I've made it very clear to him professionally that this noncompliance is unacceptable.

## 2013-01-23 NOTE — ED Notes (Signed)
Patient denies feeling sob or having any cp. States he is just going to take a nap.[name] afib on the monitor.

## 2013-01-23 NOTE — ED Notes (Signed)
Notified RN of CBG 103

## 2013-01-23 NOTE — ED Notes (Signed)
Ambulated Patient with 02 Stat without 02.  Patient range changed anywhere between 98 to 92 then back up to 97 and continue to change thru out the ambulation in that matter.

## 2013-01-23 NOTE — ED Notes (Signed)
O2 applied at 2L.  

## 2013-01-23 NOTE — ED Notes (Signed)
Pt. Stated, i was d/c'ed yesterday and I was doing the same thing, so I'm back.  i should have never been d/C'ed.

## 2013-01-23 NOTE — Telephone Encounter (Signed)
ATC patient-unable to reach or leave a message. Will need to continue to call patient as where to send letter.

## 2013-01-23 NOTE — ED Notes (Signed)
Pt repositioned upright in bed, and placed on 2L via 

## 2013-01-23 NOTE — ED Provider Notes (Signed)
History     CSN: [id number]  Arrival date & time 01/23/13  9562   First MD Initiated Contact with Patient 01/23/13 [phone]      Chief Complaint  Patient presents with  . Shortness of Breath  . Palpitations     HPI Pt was seen at 0845.   Per pt, c/o gradual onset and worsening of persistent SOB since yesterday.  States he was discharged from the hospital yesterday for new onset afib and pneumonia.  States he has progressively felt more SOB from then until this morning.  SOB worsens with ambulation. Has been associated with palpitations. Denies CP, no cough, no abd pain, no N/V/D, no back pain.     Past Medical History  Diagnosis Date  . Hypertension   . Diabetes mellitus   . Sleep apnea   . Morbid obesity   . History of cocaine use   . History of alcohol abuse   . Paroxysmal atrial fibrillation     Occurring in 2008, with several recurrence since then (including in the setting of + cocaine on UDS).    Past Surgical History  Procedure Laterality Date  . Appendectomy    . Rotator cuff repair    . Tonsillectomy      Family History  Problem Relation Age of Onset  . Heart attack Father   . Diabetes Mother     History  Substance Use Topics  . Smoking status: Current Some Day Smoker -- 0.10 packs/day for 10 years    Last Attempt to Quit: 10/17/2007  . Smokeless tobacco: Current User    Types: Snuff     Comment: smokes one pack per month  . Alcohol Use: Yes     Comment: occasionally - 1 beer 1x/mont      Review of Systems ROS: Statement: All systems negative except as marked or noted in the HPI; Constitutional: Negative for fever and chills. ; ; Eyes: Negative for eye pain, redness and discharge. ; ; ENMT: Negative for ear pain, hoarseness, nasal congestion, sinus pressure and sore throat. ; ; Cardiovascular: +SOB. Negative for chest pain, palpitations, diaphoresis, and peripheral edema. ; ; Respiratory: Negative for cough, wheezing and stridor. ; ; Gastrointestinal:  Negative for nausea, vomiting, diarrhea, abdominal pain, blood in stool, hematemesis, jaundice and rectal bleeding. . ; ; Genitourinary: Negative for dysuria, flank pain and hematuria. ; ; Musculoskeletal: Negative for back pain and neck pain. Negative for swelling and trauma.; ; Skin: Negative for pruritus, rash, abrasions, blisters, bruising and skin lesion.; ; Neuro: Negative for headache, lightheadedness and neck stiffness. Negative for weakness, altered level of consciousness , altered mental status, extremity weakness, paresthesias, involuntary movement, seizure and syncope.       Allergies  Shrimp; Sulfa antibiotics; and Watermelon flavor  Home Medications   Current Outpatient Rx  Name  Route  Sig  Dispense  Refill  . diltiazem (CARDIZEM CD) 360 MG 24 hr capsule   Oral   Take 360 mg by mouth daily.         . fluticasone (FLONASE) 50 MCG/ACT nasal spray   Nasal   Place 1 spray into the nose daily.         Marland Kitchen glipiZIDE (GLUCOTROL) 10 MG 24 hr tablet   Oral   Take 10 mg by mouth Twice daily.         Marland Kitchen LANTUS SOLOSTAR 100 UNIT/ML injection   Subcutaneous   Inject 30 Units into the skin daily with breakfast.          .  lisinopril-hydrochlorothiazide (PRINZIDE,ZESTORETIC) 10-12.5 MG per tablet   Oral   Take 1 tablet by mouth daily.         . metFORMIN (GLUCOPHAGE) 500 MG tablet   Oral   Take 1,000 mg by mouth 2 (two) times daily.          . methylphenidate (METADATE ER) 20 MG ER tablet   Oral   Take 20 mg by mouth daily as needed (ADD).            BP 137/65  Pulse 92  Temp(Src) 98.8 F (37.1 C) (Oral)  Resp 28  SpO2 95%  Physical Exam 0850: Physical examination:  Nursing notes reviewed; Vital signs and O2 SAT reviewed;  Constitutional: Well developed, Well nourished, Well hydrated, In no acute distress; Head:  Normocephalic, atraumatic; Eyes: EOMI, PERRL, No scleral icterus; ENMT: Mouth and pharynx normal, Mucous membranes moist; Neck: Supple, Full  range of motion, No lymphadenopathy; Cardiovascular: Irregular irregular rate and rhythm, No gallop; Respiratory: Breath sounds coarse & equal bilaterally, No wheezes.  Speaking full sentences with ease, Normal respiratory effort/excursion; Chest: Nontender, Movement normal; Abdomen: Soft, Nontender, Nondistended, Normal bowel sounds;; Extremities: Pulses normal, No tenderness, +1 pedal edema bilat. No calf edema or asymmetry.; Neuro: AA&Ox3, Major CN grossly intact.  Speech clear. No gross focal motor or sensory deficits in extremities.; Skin: Color normal, Warm, Dry.   ED Course  Procedures     MDM  MDM Reviewed: previous chart, nursing note and vitals Reviewed previous: ECG and labs Interpretation: ECG, labs and x-ray    Date: 01/23/2013  Rate: 89  Rhythm: atrial fibrillation  QRS Axis: left  Intervals: normal  ST/T Wave abnormalities: normal  Conduction Disutrbances:none  Narrative Interpretation:   Old EKG Reviewed: unchanged; rate slower on today's EKG, otherwise no significant changes from previous EKG dated 01/22/2013.   Results for orders placed during the hospital encounter of 01/23/13  CBC      Result Value Range   WBC 11.1 (*) 4.0 - 10.5 K/uL   RBC 4.75  4.22 - 5.81 MIL/uL   Hemoglobin 13.7  13.0 - 17.0 g/dL   HCT 96.2 (*) 95.2 - 84.1 %   MCV 78.5  78.0 - 100.0 fL   MCH 28.8  26.0 - 34.0 pg   MCHC 36.7 (*) 30.0 - 36.0 g/dL   RDW 32.4  40.1 - 02.7 %   Platelets 285  150 - 400 K/uL  BASIC METABOLIC PANEL      Result Value Range   Sodium 134 (*) 135 - 145 mEq/L   Potassium 3.9  3.5 - 5.1 mEq/L   Chloride 97  96 - 112 mEq/L   CO2 24  19 - 32 mEq/L   Glucose, Bld 111 (*) 70 - 99 mg/dL   BUN 9  6 - 23 mg/dL   Creatinine, Ser 2.53  0.50 - 1.35 mg/dL   Calcium 9.1  8.4 - 66.4 mg/dL   GFR calc non Af Amer >90  >90 mL/min   GFR calc Af Amer >90  >90 mL/min  PRO B NATRIURETIC PEPTIDE      Result Value Range   Pro B Natriuretic peptide (BNP) 1764.0 (*) 0 - 125 pg/mL   PROTIME-INR      Result Value Range   Prothrombin Time 15.9 (*) 11.6 - 15.2 seconds   INR 1.30  0.00 - 1.49  TROPONIN I      Result Value Range   Troponin I <0.30  <0.30 ng/mL  POCT  I-STAT TROPONIN I      Result Value Range   Troponin i, poc 0.00  0.00 - 0.08 ng/mL   Comment 3            Dg Chest 2 View 01/23/2013  *RADIOLOGY REPORT*  Clinical Data: Shortness of breath, palpitations  CHEST - 2 VIEW  Comparison: Portable chest x-ray of 01/20/2013  Findings: There are prominent perihilar structures some of which is due to a possible bronchitis, but mild pulmonary vascular congestion cannot be excluded.  No effusion is seen. The heart is mildly enlarged.  There are degenerative changes throughout the thoracic spine.  IMPRESSION: Cardiomegaly prominent perihilar markings consistent with bronchitis and possibly mild pulmonary vascular congestion. Consider follow-up.   Original Report Authenticated By: Dwyane Dee, [name]     [phone]:  Pt ambulated with O2 Sats ranging 91-97% R/A, but pt became tachypneic with RR increasing to 35. BNP elevated, no old to compare; will dose IV lasix and topical ntg.  T/C to [facility] Dr. Brien Few, case discussed, including:  HPI, pertinent PM/SHx, VS/PE, dx testing, ED course and treatment:  Agreeable to observation admit, requests to write temporary orders, obtain tele bed to team 5.            Laray Anger, DO 01/25/13 1209

## 2013-01-23 NOTE — ED Notes (Signed)
PATIENT MAY COME TO POD C 26 WHEN ROOM IS CLEANED

## 2013-01-23 NOTE — Discharge Summary (Signed)
Physician Discharge Summary  [name] ZOX:[id number] DOB: 1971-09-18 DOA: 01/23/2013  PCP: Katy Apo, MD  Admit date: 01/23/2013 Discharge date: 01/23/2013  Time spent: 20 minutes  Recommendations for Outpatient Follow-up:  1. Follow up with [facility] cardiology in 2 weeks.   Discharge Diagnoses:  Active Problems:   CHF (congestive heart failure)   Dyspnea   PAF (paroxysmal atrial fibrillation)   OSA (obstructive sleep apnea)   Diabetes mellitus   HTN (hypertension)   Tobacco abuse   Discharge Condition: Satisfactory.  Diet recommendation: Heart-Healthy.   There were no vitals filed for this visit.  History of present illness:  This is a 42 year old male, with known history of HTN, DM, OSA, Morbid obesity, substance abuse (ETOH/Cocaine), PAF, s/p appendectomy, hospitalized 01/20/13-01/22/13 for rapid atrial, and CAP. He now returns to the ED with progressive SOBOE, without chest pain or cough, which was evident on getting home from hospital, and has become progressively worse. He attempted to fill his discharge medication at the pharmacy on 01/22/13, but was unable to stand in line, as he felt so unwell. He had a very restless night, and was unable to sleep. this AM, he is SOB even at rest, worse on exertion. His father brought him back to the ED.    Hospital Course:  1. CHF (congestive heart failure): Patient presented with SOBOE, which has been progressive, since discharge on 01/22/13. CXR revealed Cardiomegaly prominent perihilar markings and possibly mild pulmonary vascular congestion. ProBNP is mildly elevated at 1764. Clinically, this appear consistent with mild acute decompensation of diastolic CHF. Of note, 2D Echocardiogram of 01/20/13, revealed EF of 65% to 70%. Patient was seen by Dr Valera Castle,, cardiologist in the ED, he was administered 40 mg Lasix, 100 mg of Flecainide now and also Cardizem CD 360 mg.  360 mg, as well as 20 mg Rivaroxaban. Patient was considered  clinically stable for discharge, compliance with medication stressed, and urged to keep his scheduled appointment. Cleared by cardiology for discharge.  2. Dyspnea: This is multifactorial, secondary to CHF decompensation, superimposed on OSA/OHS, possible COPD and patient may have super-added deconditioning. Treatment will be directed to specific etiologies, and improvement is anticipated, provided patient remains compliant.  3. PAF (paroxysmal atrial fibrillation: Patient is currently in rate-controlled, atrial fibrillation. On Cardizem CD/Flecainide, as well as anticoagulation.  4. OSA (obstructive sleep apnea): On nocturnal CPAP.  5. DM: Patient has insulin-requiring type 2 DM, which appears reasonably controlled, based on random blood glucose of 111. No change is indicated at this time.  6. HTN (hypertension): Controlled.  7. Tobacco abuse/Substance abuse: Counseled.  8. Recent CAP: Patient was treated for community -acquired pneumonia during his last hospitalization. He has no chest pain or productive cough, is apyrexial, and CXR is devoid of pneumonic consolidation. Will complete antibiotic therapy with a five day course of Levaquin, as originally planned.    Procedures:  See Below.   Consultations:  Dr Valera Castle, cardiologist.   Discharge Exam: Filed Vitals:   01/23/13 0955 01/23/13 1027 01/23/13 1215 01/23/13 1327  BP: 143/86 124/75 134/79 111/73  Pulse: 86 83 87 90  Temp:    99.1 F (37.3 C)  TempSrc:    Oral  Resp: 35 20 22 15   SpO2: 91% 96% 95% 92%   General: Patient does not appear to be in obvious acute distress. Alert, communicative, fully oriented, talking in complete sentences, mildly short of breath at rest.  HEENT: No clinical pallor, no jaundice, no conjunctival injection  or discharge.  NECK: Supple, JVP not seen, no carotid bruits, no palpable lymphadenopathy, no palpable goiter.  CHEST: Clinically clear to auscultation, no wheezes, no crackles.  HEART: Sounds 1  and 2 heard, normal, regular, no murmurs.  ABDOMEN: Morbidly obese, soft, non-tender, no palpable organomegaly, no palpable masses, normal bowel sounds.  GENITALIA: Not examined.  LOWER EXTREMITIES: No pitting edema, palpable peripheral pulses.  MUSCULOSKELETAL SYSTEM: Unremarkable.  CENTRAL NERVOUS SYSTEM: No focal neurologic deficit on gross examination.  Discharge Instructions       Future Appointments Provider Department Dept Phone   02/05/2013 3:40 PM Beatrice Lecher, PA-C [facility] Main Office [place]) [phone]   03/03/2013 3:15 PM Waymon Budge, MD [facility] [phone]       Medication List    TAKE these medications       diltiazem 360 MG 24 hr capsule  Commonly known as:  CARDIZEM CD  Take 360 mg by mouth daily.     fluticasone 50 MCG/ACT nasal spray  Commonly known as:  FLONASE  Place 1 spray into the nose daily.     glipiZIDE 10 MG 24 hr tablet  Commonly known as:  GLUCOTROL XL  Take 10 mg by mouth Twice daily.     LANTUS SOLOSTAR 100 UNIT/ML injection  Generic drug:  insulin glargine  Inject 30 Units into the skin daily with breakfast.     levofloxacin 750 MG tablet  Commonly known as:  LEVAQUIN  Take 1 tablet (750 mg total) by mouth daily.     lisinopril-hydrochlorothiazide 10-12.5 MG per tablet  Commonly known as:  PRINZIDE,ZESTORETIC  Take 1 tablet by mouth daily.     metFORMIN 500 MG tablet  Commonly known as:  GLUCOPHAGE  Take 1,000 mg by mouth 2 (two) times daily.     methylphenidate 20 MG ER tablet  Commonly known as:  METADATE ER  Take 20 mg by mouth daily as needed (ADD).          The results of significant diagnostics from this hospitalization (including imaging, microbiology, ancillary and laboratory) are listed below for reference.    Significant Diagnostic Studies: Dg Chest 2 View  01/23/2013  *RADIOLOGY REPORT*  Clinical Data: Shortness of breath, palpitations  CHEST - 2 VIEW  Comparison: Portable chest  x-ray of 01/20/2013  Findings: There are prominent perihilar structures some of which is due to a possible bronchitis, but mild pulmonary vascular congestion cannot be excluded.  No effusion is seen. The heart is mildly enlarged.  There are degenerative changes throughout the thoracic spine.  IMPRESSION: Cardiomegaly prominent perihilar markings consistent with bronchitis and possibly mild pulmonary vascular congestion. Consider follow-up.   Original Report Authenticated By: Dwyane Dee, [name]    Dg Chest 2 View  01/16/2013  *RADIOLOGY REPORT*  Clinical Data: Weakness and fever  CHEST - 2 VIEW  Comparison: 08/30/2010  Findings: Low lung volumes.  Basilar hypoaeration change and upper normal heart size.  Upper lungs clear.  No pneumothorax or pleural effusion.  IMPRESSION: Bibasilar atelectasis.   Original Report Authenticated By: Jolaine Click, [name]    Dg Knee 1-2 Views Right  01/20/2013  *RADIOLOGY REPORT*  Clinical Data: Right knee pain.  RIGHT KNEE - 1-2 VIEW  Comparison: None.  Findings: There is no evidence of fracture or dislocation.  The joint spaces are preserved.  No significant degenerative change is seen; the patellofemoral joint is grossly unremarkable in appearance.  No significant joint effusion is seen.  The visualized soft tissues  are normal in appearance.  IMPRESSION: No evidence of fracture or dislocation.   Original Report Authenticated By: Tonia Ghent, [name]    Dg Chest Port 1 View  01/20/2013  *RADIOLOGY REPORT*  Clinical Data: Shortness of breath.  PORTABLE CHEST - 1 VIEW  Comparison: Chest radiograph performed 01/16/2013  Findings: The lungs are well-aerated.  Pulmonary vascularity is at the upper limits of normal.  Mild bibasilar opacities may reflect atelectasis or possibly mild pneumonia.  There is no evidence of pleural effusion or pneumothorax.  The cardiomediastinal silhouette is borderline enlarged.  No acute osseous abnormalities are seen.  IMPRESSION: Mild bibasilar opacities may  reflect atelectasis or possibly mild pneumonia.  Borderline cardiomegaly noted.   Original Report Authenticated By: Tonia Ghent, [name]     Microbiology: Recent Results (from the past 240 hour(s))  CULTURE, BLOOD (ROUTINE X 2)     Status: None   Collection Time    01/20/13  2:26 PM      Result Value Range Status   Specimen Description BLOOD RIGHT HAND   Final   Special Requests BOTTLES DRAWN AEROBIC ONLY 8CC   Final   Culture  Setup Time 01/20/2013 22:47   Final   Culture     Final   Value:        BLOOD CULTURE RECEIVED NO GROWTH TO DATE CULTURE WILL BE HELD FOR 5 DAYS BEFORE ISSUING A FINAL NEGATIVE REPORT   Report Status PENDING   Incomplete  CULTURE, BLOOD (ROUTINE X 2)     Status: None   Collection Time    01/20/13  2:37 PM      Result Value Range Status   Specimen Description BLOOD RIGHT FOREARM   Final   Special Requests BOTTLES DRAWN AEROBIC ONLY 6CC   Final   Culture  Setup Time 01/20/2013 22:43   Final   Culture     Final   Value:        BLOOD CULTURE RECEIVED NO GROWTH TO DATE CULTURE WILL BE HELD FOR 5 DAYS BEFORE ISSUING A FINAL NEGATIVE REPORT   Report Status PENDING   Incomplete  URINE CULTURE     Status: None   Collection Time    01/20/13  4:06 PM      Result Value Range Status   Specimen Description URINE, CLEAN CATCH   Final   Special Requests Normal   Final   Culture  Setup Time 01/20/2013 16:38   Final   Colony Count NO GROWTH   Final   Culture NO GROWTH   Final   Report Status 01/21/2013 FINAL   Final  MRSA PCR SCREENING     Status: None   Collection Time    01/20/13  5:04 PM      Result Value Range Status   MRSA by PCR NEGATIVE  NEGATIVE Final   Comment:            The GeneXpert MRSA Assay (FDA     approved for NASAL specimens     only), is one component of a     comprehensive MRSA colonization     surveillance program. It is not     intended to diagnose MRSA     infection nor to guide or     monitor treatment for     MRSA infections.      Labs: Basic Metabolic Panel:  Recent Labs Lab 01/16/13 2145 01/20/13 0556 01/21/13 0200 01/23/13 0818  NA 135 133* 137 134*  K 3.8 4.2  4.3 3.9  CL 98 98 100 97  CO2 27 23 27 24   GLUCOSE 126* 197* 180* 111*  BUN 9 12 10 9   CREATININE 0.80 1.00 0.97 0.80  CALCIUM 9.0 9.0 8.7 9.1   Liver Function Tests:  Recent Labs Lab 01/20/13 0556  AST 33  ALT 47  ALKPHOS 83  BILITOT 0.7  PROT 7.6  ALBUMIN 3.3*   No results found for this basename: LIPASE, AMYLASE,  in the last 168 hours No results found for this basename: AMMONIA,  in the last 168 hours CBC:  Recent Labs Lab 01/16/13 2145 01/20/13 0556 01/21/13 0950 01/22/13 0440 01/23/13 0818  WBC 10.4 9.6 6.8 7.7 11.1*  NEUTROABS 7.4 6.3  --   --   --   HGB 14.7 15.8 13.2 12.9* 13.7  HCT 42.3 43.3 37.2* 36.2* 37.3*  MCV 80.9 78.9 78.8 78.0 78.5  PLT 292 281 239 274 285   Cardiac Enzymes:  Recent Labs Lab 01/16/13 2145 01/20/13 0556 01/20/13 1116 01/20/13 1803 01/21/13 0200 01/23/13 0818  CKTOTAL 104  --   --   --   --   --   CKMB 1.2  --   --   --   --   --   TROPONINI <0.30 <0.30 <0.30 <0.30 <0.30 <0.30   BNP: BNP (last 3 results)  Recent Labs  01/23/13 0818  PROBNP 1764.0*   CBG:  Recent Labs Lab 01/21/13 1622 01/21/13 2123 01/22/13 0613 01/22/13 1111 01/23/13 1216  GLUCAP 93 132* 108* 128* 103*       Signed:  Liberty Seto,CHRISTOPHER  [facility] 01/23/2013, 3:49 PM

## 2013-01-23 NOTE — ED Notes (Signed)
PATIENT HAS ARRIVED TO BE OBSERVED FOR 2-3 HOURS. CASE MANAGER IS HERE TO DISCUSS PT MEDS AND WHAT CAN BE DONE TO ASSIST HIM .

## 2013-01-23 NOTE — ED Notes (Signed)
Patient transported to X-ray 

## 2013-01-23 NOTE — ED Notes (Signed)
Pt. Breathing 28 per minute

## 2013-01-23 NOTE — Progress Notes (Signed)
ANTICOAGULATION CONSULT NOTE - Initial Consult  Pharmacy Consult for xarelto Indication: atrial fibrillation  Allergies  Allergen Reactions  . Shrimp (Shellfish Allergy) Anaphylaxis  . Sulfa Antibiotics Itching  . Watermelon Flavor Nausea And Vomiting    Patient Measurements:   Weight 144 kg  Vital Signs: Temp: 98.8 F (37.1 C) (04/10 0807) Temp src: Oral (04/10 0807) BP: 134/79 mmHg (04/10 1215) Pulse Rate: 87 (04/10 1215)  Labs:  Recent Labs  01/20/13 1803 01/21/13 0200  01/21/13 0950 01/22/13 0440 01/23/13 0818  HGB  --   --   < > 13.2 12.9* 13.7  HCT  --   --   --  37.2* 36.2* 37.3*  PLT  --   --   --  239 274 285  LABPROT  --  15.1  --   --   --  15.9*  INR  --  1.21  --   --   --  1.30  HEPARINUNFRC <0.10* 0.14*  --  0.29*  --   --   CREATININE  --  0.97  --   --   --  0.80  TROPONINI <0.30 <0.30  --   --   --  <0.30  < > = values in this interval not displayed.  The CrCl is unknown because both a height and weight (above a minimum accepted value) are required for this calculation.   Medical History: Past Medical History  Diagnosis Date  . Hypertension   . Diabetes mellitus   . Sleep apnea   . Morbid obesity   . History of cocaine use   . History of alcohol abuse   . Paroxysmal atrial fibrillation     Occurring in 2008, with several recurrence since then (including in the setting of + cocaine on UDS).    Medications:   (Not in a hospital admission)  Assessment: 42 yo man admitted with SOB to continue xarelto for afib.  He was recently discharged with a prescription for xarelto which he never filled. Goal of Therapy:   Monitor platelets by anticoagulation protocol: Yes   Plan:  Xarelto 20 mg po daily. F/u CBC as needed.  Gazelle Towe Poteet 01/23/2013,12:35 PM

## 2013-01-23 NOTE — Progress Notes (Signed)
   CARE MANAGEMENT ED NOTE 01/23/2013  Patient:  [name], [name]   Account Number:  [id number]  Date Initiated:  01/23/2013  Documentation initiated by:  Fransico Michael  Subjective/Objective Assessment:   presented to ED with c/o shortness of breath.     Subjective/Objective Assessment Detail:     Action/Plan:   ? assistance with medications.   Action/Plan Detail:   Anticipated DC Date:  01/23/2013     Status Recommendation to Physician:   Result of Recommendation:      DC Planning Services  CM consult    Choice offered to / List presented to:            Status of service:  Completed, signed off  ED Comments:   ED Comments Detail:  01/23/13-1351-J.Minnihc,RN,BSN [phone]      In to speak with patient. Inquired why patient did not fill his medications s/p dishcarge from inpatient status yesterday afternoon. States," They just sent me home on the same meds I have been on and I didn't feel good. I was short of breath and didn't want to stand in line at the [facility]." Looked at discharge instructions that patient recevied yesterday and discussed with him that Dr. Johney Frame had seen him yesterday and had added Flecainide 100mg  po twice a day. CM had long discussion with patient regarding his non-compliance with medications and stressed importance of filling prescriptions from yesterday's discharge and taking medication as ordered. Voiced understanding. Also denied any difficulty getting medications. Noted to have [facility] insurance. No further needs identified. Patient to be discharged home today from ED per cards note.  01/23/13-1337-J.Chiquita Heckert,RN,BSN [phone]      Received request from Anette, RN ED to see patient regarding prescriptions from yesterday's discharge. Unsure if he could not afford them or just didn't fill them.

## 2013-01-23 NOTE — ED Notes (Signed)
Pt c/o SOB; pt sts discharged from hospital yesterday for PNA and new onset afib; pt sts did not get any of his medications filled and now having increased SOB and feels like he is having palpitations

## 2013-01-26 LAB — CULTURE, BLOOD (ROUTINE X 2): Culture: NO GROWTH

## 2013-01-27 NOTE — Telephone Encounter (Signed)
Spoke with patient-he would like to come by the office and pick up letter. Pt is aware that letter is at front for pick up. Nothing more needed at this time.

## 2013-02-05 ENCOUNTER — Encounter: Payer: Self-pay | Admitting: Physician Assistant

## 2013-02-06 ENCOUNTER — Encounter: Payer: Self-pay | Admitting: Internal Medicine

## 2013-02-06 ENCOUNTER — Ambulatory Visit (INDEPENDENT_AMBULATORY_CARE_PROVIDER_SITE_OTHER): Payer: 59 | Admitting: Internal Medicine

## 2013-02-06 VITALS — BP 140/78 | HR 93 | Ht 69.0 in | Wt 315.6 lb

## 2013-02-06 DIAGNOSIS — G4733 Obstructive sleep apnea (adult) (pediatric): Secondary | ICD-10-CM

## 2013-02-06 DIAGNOSIS — G47411 Narcolepsy with cataplexy: Secondary | ICD-10-CM

## 2013-02-06 MED ORDER — AMPHETAMINE-DEXTROAMPHET ER 20 MG PO CP24: 20.0000 mg | ORAL_CAPSULE | ORAL | Status: DC

## 2013-02-06 NOTE — Patient Instructions (Addendum)
Script for Adderall XR 20mg , 1 twice daily if needed        Use this instead of methylphenidate  Patient informationt on Narcolepsy  We can continue CPAP 13/[facility]

## 2013-02-06 NOTE — Progress Notes (Signed)
01/25/12- [age] yoM former smoker comes to reestablish for management of obstructive sleep apnea complicated by history of allergic rhinitis and atrial fibrillation, diabetes, HTN. PCP- Dr Trula [facility] NPSG 11/23/00- AHI 20 per hour. He continues using CPAP all night every night. 14 CWP/[facility]. Using a fullface mask but putting it on so only covers his nose,  otherwise it smothers him. Fighting daytime sleepiness which affects his work. Hypnagogic hallucination associated with dreaming. 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 Vivid dreams as soon as he falls asleep. Wakes irritable from naps. Bedtime 9:30 PM, very brief sleep latency, wakes 4 times for nocturia before up at 4:30 AM. No sleeping pill. 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  Quit smoking 2009, 1 beer/ month, little caffeine. Unmarried, living w/ sister, has girlfriend Fam hx- Father hx MI, Mother dcs'd DM  05/09/12- [age] yoM former smoker comes to reestablish for management of obstructive sleep apnea complicated by history of allergic rhinitis and atrial fibrillation, diabetes, HTN. PCP- Dr Trula [facility] He returns to followup and reports use of CPAP 13/ [facility] most nights, all night " a whole 'nother world". He sleeps soundly and now with CPAP he is sleeping through his morning alarm clock which has caused him to miss work. Bedtime is 9:30 PM and he is trying to get up at 5:15 AM. Still drowsy after meals and if sitting quietly. He also has an urge to sleep if he gets angry and at that time recognizes dreaming. He does not specifically describe muscle weakness with these episodes. He gives a possible history of sleep paralysis.  10/21/12- 41 yoM former smoker followed for management of obstructive sleep apnea/ hypersomnia complicated by history of allergic rhinitis and atrial fibrillation, diabetes, HTN. PCP- Dr Trula [facility] FOLLOWS FOR: using CPAP/ 13/ [facility] every night for 5 hours per night. denies any  problems with the machine or the mask. He is still fighting daytime sleepiness and falling asleep at work. He struggles with this because he has to drive. Strong emotion makes him feel weak so that he needs to sit down. He recognizes sleep paralysis as an occasional event as well as hypnagogic hallucination. Caffeine has little daytime effect. 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 Incidental request for referral to an eye doctor because of his diabetes. He would like to go back to Dr. Luciana Axeankin who had seen him in the past. Rhinitis is improved by Flonase, asks refill.  01/14/13-41 yoM former smoker followed for management of obstructive sleep apnea/ hypersomnia complicated by history of allergic rhinitis and atrial fibrillation, diabetes, HTN. PCP- Dr Trula [facility] FOLLOWS FOR: Wears CPAP 13/ [facility] every night for about 6-8 hours; pressure working well for patient.  He still can get drowsy if he sits quietly. Caffeine has no effect. Nuvigil was no help. Multiple Sleep Latency Test 10/30/2012-pathologic daytime hypersomnia, nonspecific, compatible with idiopathic hypersomnia or narcolepsy. Mean latency 0.9 minutes with one sleep onset REM event. He has been taken off of driving at work, he says because of an issue with his blood sugar being too high.  02/06/13- 41 yoM former smoker followed for management of obstructive sleep apnea and hypersomnia/ narcolepsy with cataplexy complicated by history of allergic rhinitis and atrial fibrillation, diabetes, HTN. PCP- Dr Trula [facility] FOLLOWS FOR: wears CPAP every night for about 7 hours;pressure doing well. Recent hospital stay Hospital twice for AFib/ CHF. Also question of pneumonia. We discussed  potential for methylphenidate to precipitate atrial fibrillation which was a chronic diagnosis for him. He thinks it may be more anxious in the morning, not necessarily more alert. 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 He now describes waking with sleep paralysis and recognizes my description of hypnagogic hallucination. If startled he gets an uncomfortable weak aching feeling in the back of his neck, his knees get weak, sounds like cataplexy. I had not gotten is clear description before.  ROS-see HPI Constitutional:   No-   weight loss, night sweats, fevers, chills,  +fatigue, lassitude. HEENT:   No-  headaches, difficulty swallowing, tooth/dental problems, sore throat,       No-  sneezing, itching, ear ache, nasal congestion, post nasal drip,  CV:  No-   chest pain, orthopnea, PND, swelling in lower extremities, anasarca, dizziness, palpitations Resp: No-   shortness of breath with exertion or at rest.              No-   productive cough,  No non-productive cough,  No- coughing up of blood.              No-   change in color of mucus.  No- wheezing.   Skin: No-   rash or lesions. GI:  No-   heartburn, indigestion, abdominal pain, nausea, vomiting,  GU:  MS:  No-   joint pain or swelling.  Neuro-     nothing unusual Psych:  No- change in mood or affect. No depression or anxiety.  No memory loss.  OBJ- Physical Exam General- Alert, Oriented, Affect-appropriate, Distress- none acute, obese, intelligent Skin- rash-none, lesions- none, excoriation- none Lymphadenopathy- none Head- atraumatic            Eyes- Gross vision intact, PERRLA, conjunctivae and secretions clear            Ears- Hearing, canals-normal            Nose- rhinitis/ turbinate edema, no-Septal dev, mucus, polyps, erosion, perforation             Throat- Mallampati III , mucosa clear , drainage- none, tonsils- atrophic Neck- flexible , trachea midline, no stridor , thyroid nl, carotid no bruit Chest - symmetrical excursion , unlabored           Heart/CV- RRR , no murmur , no gallop  , no rub, nl s1 s2                           - JVD- none , edema- none, stasis changes- none, varices-  none           Lung- clear to P&A, wheeze- none, cough- none , dullness-none, rub- none           Chest wall-  Abd-  Br/ Gen/ Rectal- Not done, not indicated Extrem- cyanosis- none, clubbing, none, atrophy- none, strength- nl Neuro- grossly intact to observation

## 2013-02-12 ENCOUNTER — Encounter: Payer: Self-pay | Admitting: Internal Medicine

## 2013-02-13 NOTE — Assessment & Plan Note (Signed)
Today he is describing sleep paralysis, hypnagogic hallucination and cataplexy. These are consistent with a specific diagnosis of narcolepsy with cataplexy, rather than idiopathic hypersomnia. Plan-narcolepsy information sheet provided with discussion. Try change from methylphenidate to adderall XR 20. Medication talk.

## 2013-02-13 NOTE — Assessment & Plan Note (Signed)
Good compliance and control with CPAP. He is excessively sleepy beyond what can be explained by sleep hygiene and his sleep apnea. Weight loss would help.

## 2013-03-03 ENCOUNTER — Encounter: Payer: Self-pay | Admitting: Internal Medicine

## 2013-03-03 ENCOUNTER — Ambulatory Visit (INDEPENDENT_AMBULATORY_CARE_PROVIDER_SITE_OTHER): Payer: 59 | Admitting: Internal Medicine

## 2013-03-03 VITALS — BP 130/90 | HR 88 | Ht 70.0 in | Wt 313.8 lb

## 2013-03-03 DIAGNOSIS — G47411 Narcolepsy with cataplexy: Secondary | ICD-10-CM

## 2013-03-03 DIAGNOSIS — Z8679 Personal history of other diseases of the circulatory system: Secondary | ICD-10-CM

## 2013-03-03 DIAGNOSIS — G4733 Obstructive sleep apnea (adult) (pediatric): Secondary | ICD-10-CM

## 2013-03-03 MED ORDER — DEXTROAMPHETAMINE SULFATE ER 15 MG PO CP24: ORAL_CAPSULE | ORAL | Status: DC

## 2013-03-03 NOTE — Patient Instructions (Addendum)
Script for dexedrine    Gradually try up to 4 daily if needed, in divided doses.   Order-  [facility]- DME He would like to change from [facility] to a CPAP supplier closer to him in [place] area                                 CPAP reduce to 11, mask of choice, humidifier and supplies    Dx OSA

## 2013-03-03 NOTE — Progress Notes (Signed)
01/25/12- [age] yoM former smoker comes to reestablish for management of obstructive sleep apnea complicated by history of allergic rhinitis and atrial fibrillation, diabetes, HTN. PCP- Dr [name] NPSG 11/23/00- AHI 20 per hour. He continues using CPAP all night every night. 14 CWP/[employer]. Using a fullface mask but putting it on so only covers his nose,  otherwise it smothers him. Fighting daytime sleepiness which affects his work. Hypnagogic hallucination associated with dreaming. 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 Vivid dreams as soon as he falls asleep. Wakes irritable from naps. Bedtime 9:30 PM, very brief sleep latency, wakes 4 times for nocturia before up at 4:30 AM. No sleeping pill. 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  Quit smoking 2009, 1 beer/ month, little caffeine. Unmarried, living w/ sister, has girlfriend Fam hx- Father hx MI, Mother dcs'd DM  05/09/12- [age] yoM former smoker comes to reestablish for management of obstructive sleep apnea complicated by history of allergic rhinitis and atrial fibrillation, diabetes, HTN. PCP- Dr [name] He returns to followup and reports use of CPAP 13/ [employer] most nights, all night " a whole 'nother world". He sleeps soundly and now with CPAP he is sleeping through his morning alarm clock which has caused him to miss work. Bedtime is 9:30 PM and he is trying to get up at 5:15 AM. Still drowsy after meals and if sitting quietly. He also has an urge to sleep if he gets angry and at that time recognizes dreaming. He does not specifically describe muscle weakness with these episodes. He gives a possible history of sleep paralysis.  10/21/12- 41 yoM former smoker followed for management of obstructive sleep apnea/ hypersomnia complicated by history of allergic rhinitis and atrial fibrillation, diabetes, HTN. PCP- Dr [name] FOLLOWS FOR: using CPAP/ 13/ [employer] every night for 5 hours per night. denies any  problems with the machine or the mask. He is still fighting daytime sleepiness and falling asleep at work. He struggles with this because he has to drive. Strong emotion makes him feel weak so that he needs to sit down. He recognizes sleep paralysis as an occasional event as well as hypnagogic hallucination. Caffeine has little daytime effect. 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 Incidental request for referral to an eye doctor because of his diabetes. He would like to go back to Dr. Luciana Walsh who had seen him in the past. Rhinitis is improved by Flonase, asks refill.  01/14/13-41 yoM former smoker followed for management of obstructive sleep apnea/ hypersomnia complicated by history of allergic rhinitis and atrial fibrillation, diabetes, HTN. PCP- Dr [name] FOLLOWS FOR: Wears CPAP 13/ [employer] every night for about 6-8 hours; pressure working well for patient.  He still can get drowsy if he sits quietly. Caffeine has no effect. Nuvigil was no help. Multiple Sleep Latency Test 10/30/2012-pathologic daytime hypersomnia, nonspecific, compatible with idiopathic hypersomnia or narcolepsy. Mean latency 0.9 minutes with one sleep onset REM event. He has been taken off of driving at work, he says because of an issue with his blood sugar being too high.  02/06/13- 41 yoM former smoker followed for management of obstructive sleep apnea and hypersomnia/ narcolepsy with cataplexy complicated by history of allergic rhinitis and atrial fibrillation, diabetes, HTN. PCP- Dr [name] FOLLOWS FOR: wears CPAP every night for about 7 hours;pressure doing well. Recent hospital stay Hospital twice for AFib/ CHF. Also question of pneumonia. We discussed  potential for methylphenidate to precipitate atrial fibrillation which was a chronic diagnosis for him. He thinks it may be more anxious in the morning, not necessarily more alert. 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 He now describes waking with sleep paralysis and recognizes my description of hypnagogic hallucination. If startled he gets an uncomfortable weak aching feeling in the back of his neck, his knees get weak, sounds like cataplexy. I had not gotten is clear description before.  03/03/13- 71 yoM former smoker followed for management of obstructive sleep apnea and hypersomnia/ narcolepsy with cataplexy complicated by history of allergic rhinitis and atrial fibrillation, diabetes, HTN. PCP- Dr Douglas [name] 1 month follow up.  Wearing cpap 13/ [employer] everynight for approx 7 hours.  No problems with mask or pressure, he does follow some air. Still having daytime sleepimess.  Adderall helps him feel alert but lasts only 2 hours, and he still does. 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 Pressure reduced to 11 due to gas pains.   ROS-see HPI Constitutional:   No-   weight loss, night sweats, fevers, chills,  +fatigue, lassitude. HEENT:   No-  headaches, difficulty swallowing, tooth/dental problems, sore throat,       No-  sneezing, itching, ear ache, nasal congestion, post nasal drip,  CV:  No-   chest pain, orthopnea, PND, swelling in lower extremities, anasarca, dizziness, palpitations Resp: No-   shortness of breath with exertion or at rest.              No-   productive cough,  No non-productive cough,  No- coughing up of blood.              No-   change in color of mucus.  No- wheezing.   Skin: No-   rash or lesions. GI:  No-   heartburn, indigestion, abdominal pain, nausea, vomiting,  GU:  MS:  No-   joint pain or swelling.  Neuro-     nothing unusual Psych:  No- change in mood or affect. No depression or anxiety.  No memory loss.  OBJ- Physical Exam General- Alert, Oriented, Affect-appropriate, Distress- none acute, obese, intelligent Skin- rash-none, lesions- none, excoriation- none Lymphadenopathy- none Head- atraumatic             Eyes- Gross vision intact, PERRLA, conjunctivae and secretions clear            Ears- Hearing, canals-normal            Nose- rhinitis/ turbinate edema, no-Septal dev, mucus, polyps, erosion, perforation             Throat- Mallampati III , mucosa clear , drainage- none, tonsils- atrophic Neck- flexible , trachea midline, no stridor , thyroid nl, carotid no bruit Chest - symmetrical excursion , unlabored           Heart/CV- RRR , no murmur , no gallop  , no rub, nl s1 s2                           - JVD- none , edema- none, stasis changes- none, varices- none           Lung- clear to P&A, wheeze- none, cough- none , dullness-none, rub- none           Chest wall-  Abd-  Br/ Gen/ Rectal- Not done, not indicated Extrem- cyanosis- none,  clubbing, none, atrophy- none, strength- nl Neuro- grossly intact to observation

## 2013-03-14 NOTE — Assessment & Plan Note (Signed)
Complains of gas pains from CPAP indicating swallowed air. Plan-I reduced CPAP pressure to 11

## 2013-03-14 NOTE — Assessment & Plan Note (Signed)
Is difficult to provide doses of stimulant sufficient keep him feeling alert. I've emphasized side effects, issues of tolerance and potentials for abuse  He understands the importance of naps. Plan- Instead of adderall, try dexedrine 15 mg ER

## 2013-03-14 NOTE — Assessment & Plan Note (Signed)
He remains in sinus rhythm by clinical exam. He is tolerating Adderall without triggering AFib.

## 2013-03-25 ENCOUNTER — Ambulatory Visit: Payer: Self-pay | Admitting: Internal Medicine

## 2013-04-01 ENCOUNTER — Encounter: Payer: Self-pay | Admitting: Cardiovascular Disease

## 2013-04-08 ENCOUNTER — Telehealth: Payer: Self-pay | Admitting: Internal Medicine

## 2013-04-08 NOTE — Telephone Encounter (Signed)
lmomtcb x1 

## 2013-04-09 MED ORDER — DEXTROAMPHETAMINE SULFATE ER 15 MG PO CP24: ORAL_CAPSULE | ORAL | Status: DC

## 2013-04-09 NOTE — Telephone Encounter (Signed)
RX signed and at front for pick up. Pt was made aware this morning that he could come by the office after 11:00am to pick up. Will sign off on message.

## 2013-04-09 NOTE — Telephone Encounter (Signed)
Pt is waiting in lobby for rx

## 2013-04-09 NOTE — Telephone Encounter (Signed)
Spoke with pt.  He is requesting rx for dextroamphetamine 15 mg. Last OV with CDY: 03/03/13; asked to f/u in 2 months. Pending OV with CDY: 05/05/13 Dextroamphetamine 15 mg rx last given on 03/03/13 for #120 x 0. Rx printed for CDY to sign when he returns to office this am from mtg. Pt aware this will be ready for pick up between 11-12 today. He verbalized understanding. Will route msg to Katie to document when rx has been signed.

## 2013-05-05 ENCOUNTER — Encounter: Payer: Self-pay | Admitting: *Deleted

## 2013-05-05 ENCOUNTER — Encounter: Payer: Self-pay | Admitting: Internal Medicine

## 2013-05-05 ENCOUNTER — Ambulatory Visit (INDEPENDENT_AMBULATORY_CARE_PROVIDER_SITE_OTHER): Payer: 59 | Admitting: Internal Medicine

## 2013-05-05 VITALS — BP 130/80 | HR 93 | Ht 69.0 in | Wt 313.8 lb

## 2013-05-05 DIAGNOSIS — G47411 Narcolepsy with cataplexy: Secondary | ICD-10-CM

## 2013-05-05 DIAGNOSIS — G4733 Obstructive sleep apnea (adult) (pediatric): Secondary | ICD-10-CM

## 2013-05-05 DIAGNOSIS — Z720 Tobacco use: Secondary | ICD-10-CM

## 2013-05-05 DIAGNOSIS — F17200 Nicotine dependence, unspecified, uncomplicated: Secondary | ICD-10-CM

## 2013-05-05 NOTE — Patient Instructions (Addendum)
Try taking your second Dexedrine earlier in the morning to reduce stimulation at bedtime  Letter- If you can't continue with your job with the [employer], then I would support a determination that you are totally and permanently disabled by your Narcolepsy and Sleep Apnea.

## 2013-05-05 NOTE — Progress Notes (Signed)
01/25/12- [age] yoM former smoker comes to reestablish for management of obstructive sleep apnea complicated by history of allergic rhinitis and atrial fibrillation, diabetes, HTN. PCP- Dr Trula Sladeon [facility] NPSG 11/23/00- AHI 20 per hour. He continues using CPAP all night every night. 14 CWP/[employer]. Using a fullface mask but putting it on so only covers his nose,  otherwise it smothers him. Fighting daytime sleepiness which affects his work. Hypnagogic hallucination associated with dreaming. 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 Vivid dreams as soon as he falls asleep. Wakes irritable from naps. Bedtime 9:30 PM, very brief sleep latency, wakes 4 times for nocturia before up at 4:30 AM. No sleeping pill. 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  Quit smoking 2009, 1 beer/ month, little caffeine. Unmarried, living w/ sister, has girlfriend Fam hx- Father hx MI, Mother dcs'd DM  05/09/12- [age] yoM former smoker comes to reestablish for management of obstructive sleep apnea complicated by history of allergic rhinitis and atrial fibrillation, diabetes, HTN. PCP- Dr Trula Sladeon [facility] He returns to followup and reports use of CPAP 13/ [employer] most nights, all night " a whole 'nother world". He sleeps soundly and now with CPAP he is sleeping through his morning alarm clock which has caused him to miss work. Bedtime is 9:30 PM and he is trying to get up at 5:15 AM. Still drowsy after meals and if sitting quietly. He also has an urge to sleep if he gets angry and at that time recognizes dreaming. He does not specifically describe muscle weakness with these episodes. He gives a possible history of sleep paralysis.  10/21/12- 41 yoM former smoker followed for management of obstructive sleep apnea/ hypersomnia complicated by history of allergic rhinitis and atrial fibrillation, diabetes, HTN. PCP- Dr Trula Sladeon [facility] FOLLOWS FOR: using CPAP/ 13/ [employer] every night for 5 hours per night. denies any  problems with the machine or the mask. He is still fighting daytime sleepiness and falling asleep at work. He struggles with this because he has to drive. Strong emotion makes him feel weak so that he needs to sit down. He recognizes sleep paralysis as an occasional event as well as hypnagogic hallucination. Caffeine has little daytime effect. 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 Incidental request for referral to an eye doctor because of his diabetes. He would like to go back to Dr. Luciana Axeankin who had seen him in the past. Rhinitis is improved by Flonase, asks refill.  01/14/13-41 yoM former smoker followed for management of obstructive sleep apnea/ hypersomnia complicated by history of allergic rhinitis and atrial fibrillation, diabetes, HTN. PCP- Dr Trula Sladeon [facility] FOLLOWS FOR: Wears CPAP 13/ [employer] every night for about 6-8 hours; pressure working well for patient.  He still can get drowsy if he sits quietly. Caffeine has no effect. Nuvigil was no help. Multiple Sleep Latency Test 10/30/2012-pathologic daytime hypersomnia, nonspecific, compatible with idiopathic hypersomnia or narcolepsy. Mean latency 0.9 minutes with one sleep onset REM event. He has been taken off of driving at work, he says because of an issue with his blood sugar being too high.  02/06/13- 41 yoM former smoker followed for management of obstructive sleep apnea and hypersomnia/ narcolepsy with cataplexy complicated by history of allergic rhinitis and atrial fibrillation, diabetes, HTN. PCP- Dr Trula Sladeon [facility] FOLLOWS FOR: wears CPAP every night for about 7 hours;pressure doing well. Recent hospital stay Hospital twice for AFib/ CHF. Also question of pneumonia. We discussed  potential for methylphenidate to precipitate atrial fibrillation which was a chronic diagnosis for him. He thinks it may be more anxious in the morning, not necessarily more alert. 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 He now describes waking with sleep paralysis and recognizes my description of hypnagogic hallucination. If startled he gets an uncomfortable weak aching feeling in the back of his neck, his knees get weak, sounds like cataplexy. I had not gotten is clear description before.  03/03/13- 43 yoM former smoker followed for management of obstructive sleep apnea and hypersomnia/ narcolepsy with cataplexy complicated by history of allergic rhinitis and atrial fibrillation, diabetes, HTN. PCP- Dr Trula Slade 1 month follow up.  Wearing cpap 13/ [employer] everynight for approx 7 hours.  No problems with mask or pressure, he does follow some air. Still having daytime sleepimess.  Adderall helps him feel alert but lasts only 2 hours, and he still does. 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 Pressure reduced to 11 due to gas pains.   05/05/13- 41 yoM former smoker followed for management of obstructive sleep apnea and hypersomnia/ narcolepsy with cataplexy complicated by history of allergic rhinitis and atrial fibrillation, diabetes, HTN. PCP- Dr Trula Slade FOLLOWS FOR: pt reports wearing CPAP11/ [employer] machine everynight x 7 hrs per night -- pt reports pressure setting is comfortable no supplies needed-- denies any other concerns at this time Continues Dexedrine, one at 5 AM and one at lunch time. Any later in the day interferes with sleep at night. 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  ROS-see HPI Constitutional:   No-   weight loss, night sweats, fevers, chills,  +fatigue, lassitude. HEENT:   No-  headaches, difficulty swallowing, tooth/dental problems, sore throat,       No-  sneezing, itching, ear ache, nasal congestion, post nasal drip,  CV:  No-   chest pain, orthopnea, PND, swelling in lower extremities, anasarca, dizziness, palpitations Resp: No-   shortness of breath  with exertion or at rest.              No-   productive cough,  No non-productive cough,  No- coughing up of blood.              No-   change in color of mucus.  No- wheezing.   Skin: No-   rash or lesions. GI:  No-   heartburn, indigestion, abdominal pain, nausea, vomiting,  GU:  MS:  No-   joint pain or swelling.  Neuro-     nothing unusual Psych:  No- change in mood or affect. No depression or anxiety.  No memory loss.  OBJ- Physical Exam General- Alert/ calm, Oriented, Affect-appropriate, Distress- none acute, obese,  Skin- rash-none, lesions- none, excoriation- none Lymphadenopathy- none Head- atraumatic            Eyes- Gross vision intact, PERRLA, conjunctivae and secretions clear            Ears- Hearing, canals-normal            Nose- rhinitis/ turbinate edema, no-Septal dev, mucus, polyps, erosion, perforation             Throat- Mallampati III , mucosa clear , drainage- none, tonsils- atrophic Neck- flexible , trachea midline, no stridor , thyroid nl, carotid no bruit Chest - symmetrical excursion , unlabored  Heart/CV- RRR , no murmur , no gallop  , no rub, nl s1 s2                           - JVD- none , edema- none, stasis changes- none, varices- none           Lung- clear to P&A, wheeze- none, cough- none , dullness-none, rub- none           Chest wall-  Abd-  Br/ Gen/ Rectal- Not done, not indicated Extrem- cyanosis- none, clubbing, none, atrophy- none, strength- nl Neuro- grossly intact to observation

## 2013-05-09 ENCOUNTER — Telehealth: Payer: Self-pay | Admitting: Internal Medicine

## 2013-05-09 NOTE — Telephone Encounter (Signed)
Per OV note 05/05/13: Patient Instructions    Try taking your second Dexedrine earlier in the morning to reduce stimulation at bedtime  Letter- If you can't continue with your job with the [employer], then I would support a determination that you are totally and permanently disabled by your Narcolepsy and Sleep Apnea.  ---  I spoke with pt. He was calling to make sure of his DX. I confirmed with him this is so. Nothing further was needed

## 2013-05-18 NOTE — Assessment & Plan Note (Signed)
Is not having a problem with inappropriate use of Dexedrine. We discussed best time during the day to take it. He is considering total and permanent disability, or might be able to work for his [employer]. I will support disability based on narcolepsy, if he can't find other work.

## 2013-05-18 NOTE — Assessment & Plan Note (Signed)
Compliance and control seems good with CPAP. He needs to lose weight.

## 2013-06-06 ENCOUNTER — Telehealth: Payer: Self-pay | Admitting: Internal Medicine

## 2013-06-06 NOTE — Telephone Encounter (Signed)
LMOVM that CY is out of the office until Monday and we would handle this then. If any other questions or concerns in the meantime to call the office.

## 2013-06-09 NOTE — Telephone Encounter (Signed)
Per CY-OSA dx'd by NPSG on 12-27-00   DX MLST based on MSLT done 10-30-12

## 2013-06-09 NOTE — Telephone Encounter (Signed)
lmomtcb x1 for michele

## 2013-06-10 NOTE — Telephone Encounter (Signed)
I spoke with Marcelino Duster and advised as below. Correction DX Narcolepsy based on MSLT done 10-30-12. Carron Curie, CMA

## 2013-06-30 ENCOUNTER — Telehealth: Payer: Self-pay | Admitting: Internal Medicine

## 2013-06-30 MED ORDER — DEXTROAMPHETAMINE SULFATE ER 15 MG PO CP24: ORAL_CAPSULE | ORAL | Status: DC

## 2013-06-30 NOTE — Telephone Encounter (Signed)
RX printed and placed on CDY cart for signature. Please advise thanks 

## 2013-06-30 NOTE — Telephone Encounter (Signed)
Rx signed and at front for pick up. Pt aware.

## 2013-07-30 ENCOUNTER — Telehealth: Payer: Self-pay | Admitting: Internal Medicine

## 2013-07-30 MED ORDER — DEXTROAMPHETAMINE SULFATE ER 15 MG PO CP24: ORAL_CAPSULE | ORAL | Status: DC

## 2013-07-30 NOTE — Telephone Encounter (Signed)
Rx printed, signed by CY, and placed at front for pick up. Pt states he will pick this up tomorrow.

## 2013-09-01 ENCOUNTER — Telehealth: Payer: Self-pay | Admitting: Internal Medicine

## 2013-09-01 MED ORDER — DEXTROAMPHETAMINE SULFATE ER 15 MG PO CP24: ORAL_CAPSULE | ORAL | Status: DC

## 2013-09-01 NOTE — Telephone Encounter (Signed)
Rx at front desk.  Pt aware.  

## 2013-09-01 NOTE — Telephone Encounter (Signed)
noted 

## 2013-09-01 NOTE — Telephone Encounter (Signed)
RX printed and placed on CD[facility] cart for signature. Please advise once done thanks 

## 2013-09-08 ENCOUNTER — Ambulatory Visit: Payer: Self-pay | Admitting: Internal Medicine

## 2013-10-24 ENCOUNTER — Encounter (HOSPITAL_COMMUNITY): Payer: Self-pay | Admitting: Emergency Medicine

## 2013-10-24 DIAGNOSIS — G4730 Sleep apnea, unspecified: Secondary | ICD-10-CM | POA: Insufficient documentation

## 2013-10-24 DIAGNOSIS — R000 Tachycardia, unspecified: Secondary | ICD-10-CM | POA: Insufficient documentation

## 2013-10-24 DIAGNOSIS — Z91199 Patient's noncompliance with other medical treatment and regimen due to unspecified reason: Secondary | ICD-10-CM | POA: Insufficient documentation

## 2013-10-24 DIAGNOSIS — Y9289 Other specified places as the place of occurrence of the external cause: Secondary | ICD-10-CM | POA: Insufficient documentation

## 2013-10-24 DIAGNOSIS — Z882 Allergy status to sulfonamides status: Secondary | ICD-10-CM | POA: Insufficient documentation

## 2013-10-24 DIAGNOSIS — L03319 Cellulitis of trunk, unspecified: Principal | ICD-10-CM

## 2013-10-24 DIAGNOSIS — F1011 Alcohol abuse, in remission: Secondary | ICD-10-CM | POA: Insufficient documentation

## 2013-10-24 DIAGNOSIS — E1169 Type 2 diabetes mellitus with other specified complication: Secondary | ICD-10-CM | POA: Insufficient documentation

## 2013-10-24 DIAGNOSIS — Z87891 Personal history of nicotine dependence: Secondary | ICD-10-CM | POA: Insufficient documentation

## 2013-10-24 DIAGNOSIS — Z9119 Patient's noncompliance with other medical treatment and regimen: Secondary | ICD-10-CM | POA: Insufficient documentation

## 2013-10-24 DIAGNOSIS — F1411 Cocaine abuse, in remission: Secondary | ICD-10-CM | POA: Insufficient documentation

## 2013-10-24 DIAGNOSIS — Y990 Civilian activity done for income or pay: Secondary | ICD-10-CM | POA: Insufficient documentation

## 2013-10-24 DIAGNOSIS — Z7902 Long term (current) use of antithrombotics/antiplatelets: Secondary | ICD-10-CM | POA: Insufficient documentation

## 2013-10-24 DIAGNOSIS — Z794 Long term (current) use of insulin: Secondary | ICD-10-CM | POA: Insufficient documentation

## 2013-10-24 DIAGNOSIS — Z79899 Other long term (current) drug therapy: Secondary | ICD-10-CM | POA: Insufficient documentation

## 2013-10-24 DIAGNOSIS — I10 Essential (primary) hypertension: Secondary | ICD-10-CM | POA: Insufficient documentation

## 2013-10-24 DIAGNOSIS — I4891 Unspecified atrial fibrillation: Secondary | ICD-10-CM | POA: Insufficient documentation

## 2013-10-24 DIAGNOSIS — L02219 Cutaneous abscess of trunk, unspecified: Secondary | ICD-10-CM | POA: Insufficient documentation

## 2013-10-24 NOTE — ED Notes (Addendum)
?   Spider bite on posterior, upper back; reddened, swollen, no puncture, (3" x 2") "feels like burning, stinging sensation." BP elevated, and is taking BP meds.

## 2013-10-25 ENCOUNTER — Emergency Department (HOSPITAL_COMMUNITY)
Admission: EM | Admit: 2013-10-25 | Discharge: 2013-10-25 | Disposition: A | Discharge status: Home / Self Care | Payer: 59 | Attending: Emergency Medicine | Admitting: Emergency Medicine

## 2013-10-25 DIAGNOSIS — L0390 Cellulitis, unspecified: Secondary | ICD-10-CM

## 2013-10-25 DIAGNOSIS — Z9114 Patient's other noncompliance with medication regimen: Secondary | ICD-10-CM

## 2013-10-25 DIAGNOSIS — I10 Essential (primary) hypertension: Secondary | ICD-10-CM

## 2013-10-25 MED ORDER — CEPHALEXIN 500 MG PO CAPS: 500.0000 mg | ORAL_CAPSULE | Freq: Four times a day (QID) | ORAL | Status: DC

## 2013-10-25 MED ORDER — CEPHALEXIN 250 MG PO CAPS
500.0000 mg | ORAL_CAPSULE | Freq: Once | ORAL | Status: AC
Start: 2013-10-25 — End: 2013-10-25
  Administered 2013-10-25: 500 mg via ORAL
  Filled 2013-10-25: qty 2

## 2013-10-25 NOTE — ED Provider Notes (Signed)
Medical screening examination/treatment/procedure(s) were performed by non-physician practitioner and as supervising physician I was immediately available for consultation/collaboration.  Akosua Constantine L Jerrid Forgette, MD 10/25/13 0724 

## 2013-10-25 NOTE — Discharge Instructions (Signed)
Cellulitis Cellulitis is an infection of the skin and the tissue beneath it. The infected area is usually red and tender. Cellulitis occurs most often in the arms and lower legs.  CAUSES  Cellulitis is caused by bacteria that enter the skin through cracks or cuts in the skin. The most common types of bacteria that cause cellulitis are Staphylococcus and Streptococcus. SYMPTOMS   Redness and warmth.  Swelling.  Tenderness or pain.  Fever. DIAGNOSIS  Your caregiver can usually determine what is wrong based on a physical exam. Blood tests may also be done. TREATMENT  Treatment usually involves taking an antibiotic medicine. HOME CARE INSTRUCTIONS   Take your antibiotics as directed. Finish them even if you start to feel better.  Keep the infected arm or leg elevated to reduce swelling.  Apply a warm cloth to the affected area up to 4 times per day to relieve pain.  Only take over-the-counter or prescription medicines for pain, discomfort, or fever as directed by your caregiver.  Keep all follow-up appointments as directed by your caregiver. SEEK MEDICAL CARE IF:   You notice red streaks coming from the infected area.  Your red area gets larger or turns dark in color.  Your bone or joint underneath the infected area becomes painful after the skin has healed.  Your infection returns in the same area or another area.  You notice a swollen bump in the infected area.  You develop new symptoms. SEEK IMMEDIATE MEDICAL CARE IF:   You have a fever.  You feel very sleepy.  You develop vomiting or diarrhea.  You have a general ill feeling (malaise) with muscle aches and pains. MAKE SURE YOU:   Understand these instructions.  Will watch your condition.  Will get help right away if you are not doing well or get worse. Document Released: 07/12/2005 Document Revised: 04/02/2012 Document Reviewed: 12/18/2011 [facility] Patient Information 2014 [employer], [place]. Please take the  antibiotic as directed until, completed.  Warm soaks, to the area several times a day for the next 2-3, days.  Please call Dr. polite make an appointment to have this area rechecked in the next 3-5 days.  Please start/restart taking her antihypertensive medications.  On a regular basis

## 2013-10-25 NOTE — ED Provider Notes (Signed)
CSN: [id number]     Arrival date & time 10/24/13  2257 History   First MD Initiated Contact with Patient 10/25/13 0036     Chief Complaint  Patient presents with  . Abscess  . Hypertension   (Consider location/radiation/quality/duration/timing/severity/associated sxs/prior Treatment) HPI Comments: Joslyn Devon states several, days, ago.  He was working in the words.  He noticed a stinging sensation to his right upper shoulder blade.  That been intermittent since, then.  Denies any fever, headache, tachycardia, myalgias.  He, states he was out of his blood pressure medicine, for 2 weeks, and just filled.  This prescription has not started taking that as of yet.  He does have her primary care physician, that he can followup with at this time.  Patient is a 43 y.o. male presenting with abscess and hypertension. The history is provided by the patient.  Abscess Location:  Torso Torso abscess location:  Upper back Abscess quality: induration, itching, painful, redness and warmth   Abscess quality: not draining and no fluctuance   Red streaking: no   Duration:  2 days Progression:  Unchanged Pain details:    Quality:  Aching   Severity:  Moderate   Duration:  2 days   Timing:  Intermittent   Progression:  Unchanged Chronicity:  New Context: not diabetes, not immunosuppression, not injected drug use, not insect bite/sting and not skin injury   Relieved by:  Nothing Worsened by:  Nothing tried Ineffective treatments:  None tried Associated symptoms: no fever   Hypertension Pertinent negatives include no chills, fever or myalgias.    Past Medical History  Diagnosis Date  . Hypertension   . Diabetes mellitus   . Sleep apnea   . Morbid obesity   . History of cocaine use   . History of alcohol abuse   . Paroxysmal atrial fibrillation     Occurring in 2008, with several recurrence since then (including in the setting of + cocaine on UDS).   Past Surgical History  Procedure Laterality Date   . Appendectomy    . Rotator cuff repair    . Tonsillectomy     Family History  Problem Relation Age of Onset  . Heart attack Father   . Diabetes Mother    History  Substance Use Topics  . Smoking status: Former Smoker -- 0.10 packs/day for 10 years    Quit date: 10/17/2007  . Smokeless tobacco: Current User    Types: Snuff     Comment: smokes one pack per month  . Alcohol Use: Yes     Comment: occasionally - 1 beer 1x/mont    Review of Systems  Constitutional: Negative for fever and chills.  Musculoskeletal: Negative for myalgias.  Skin: Positive for color change and wound.  Neurological: Negative for dizziness.  All other systems reviewed and are negative.    Allergies  Shrimp; Banana; Sulfa antibiotics; and Watermelon flavor  Home Medications   Current Outpatient Rx  Name  Route  Sig  Dispense  Refill  . atorvastatin (LIPITOR) 10 MG tablet   Oral   Take 10 mg by mouth daily.         Marland Kitchen diltiazem (CARDIZEM CD) 360 MG 24 hr capsule   Oral   Take 360 mg by mouth daily.         . flecainide (TAMBOCOR) 100 MG tablet   Oral   Take 1 tablet by mouth 2 (two) times daily.         . fluticasone (FLONASE)  50 MCG/ACT nasal spray   Nasal   Place 1 spray into the nose daily.         Marland Kitchen. glipiZIDE (GLUCOTROL) 10 MG 24 hr tablet   Oral   Take 10 mg by mouth Twice daily.         Marland Kitchen. LANTUS SOLOSTAR 100 UNIT/ML injection   Subcutaneous   Inject 30 Units into the skin daily with breakfast.          . lisinopril-hydrochlorothiazide (PRINZIDE,ZESTORETIC) 10-12.5 MG per tablet   Oral   Take 1 tablet by mouth daily.         . metFORMIN (GLUCOPHAGE) 500 MG tablet   Oral   Take 1,000 mg by mouth 2 (two) times daily.          . ONE TOUCH ULTRA TEST test strip      Use as directed - 4 times daily         . XARELTO 20 MG TABS   Oral   Take 1 tablet by mouth daily.         . cephALEXin (KEFLEX) 500 MG capsule   Oral   Take 1 capsule (500 mg total)  by mouth 4 (four) times daily.   27 capsule   0    BP 115/70  Pulse 97  Temp(Src) 98.5 F (36.9 C) (Oral)  Resp 14  SpO2 93% Physical Exam  Nursing note and vitals reviewed. Constitutional: He appears well-developed and well-nourished.  HENT:  Head: Normocephalic.  Eyes: Pupils are equal, round, and reactive to light.  Neck: Normal range of motion.  Cardiovascular: Regular rhythm.  Tachycardia present.   Pulmonary/Chest: Effort normal.  Abdominal: Soft.  Musculoskeletal: Normal range of motion.  Neurological: He is alert.  Skin: Skin is warm.    ED Course  Procedures (including critical care time) Labs Review Labs Reviewed - No data to display Imaging Review No results found.  EKG Interpretation   None       MDM   1. Cellulitis   2. Hypertension   3. Noncompliance with medication regimen    Area examined with ultrasound, no deep tissue abscess observed. Does not appear to be an abscess.  It appears to be cellulitic.  I recommended the patient.  Do, warm soaks, several times a day.  I started him on Keflex 4 times a day.  500 mg for 7 days and encouraged him to restart taking his blood pressure medicine, and followup with Dr. Nehemiah SettlePolite.  Next week    Arman FilterGail K Susann Lawhorne, NP 10/25/13 [phone]

## 2013-10-25 NOTE — ED Notes (Addendum)
5 cm to 7 cm area of cellulitis on the right shoulder. Pt states that he has not been taking his BP medications, but has just recently gotten them refilled.

## 2013-10-25 NOTE — ED Notes (Signed)
Schulz, NP at bedside.  

## 2013-10-29 ENCOUNTER — Emergency Department (HOSPITAL_COMMUNITY)
Admission: EM | Admit: 2013-10-29 | Discharge: 2013-10-29 | Disposition: A | Discharge status: Home / Self Care | Payer: 59 | Attending: Emergency Medicine | Admitting: Emergency Medicine

## 2013-10-29 ENCOUNTER — Encounter (HOSPITAL_COMMUNITY): Payer: Self-pay | Admitting: Emergency Medicine

## 2013-10-29 DIAGNOSIS — E860 Dehydration: Secondary | ICD-10-CM

## 2013-10-29 DIAGNOSIS — L02219 Cutaneous abscess of trunk, unspecified: Secondary | ICD-10-CM | POA: Insufficient documentation

## 2013-10-29 DIAGNOSIS — IMO0002 Reserved for concepts with insufficient information to code with codable children: Secondary | ICD-10-CM | POA: Insufficient documentation

## 2013-10-29 DIAGNOSIS — Z792 Long term (current) use of antibiotics: Secondary | ICD-10-CM | POA: Insufficient documentation

## 2013-10-29 DIAGNOSIS — Z87891 Personal history of nicotine dependence: Secondary | ICD-10-CM | POA: Insufficient documentation

## 2013-10-29 DIAGNOSIS — L02212 Cutaneous abscess of back [any part, except buttock]: Secondary | ICD-10-CM

## 2013-10-29 DIAGNOSIS — L03319 Cellulitis of trunk, unspecified: Principal | ICD-10-CM

## 2013-10-29 DIAGNOSIS — I10 Essential (primary) hypertension: Secondary | ICD-10-CM | POA: Insufficient documentation

## 2013-10-29 DIAGNOSIS — Z794 Long term (current) use of insulin: Secondary | ICD-10-CM | POA: Insufficient documentation

## 2013-10-29 DIAGNOSIS — L03312 Cellulitis of back [any part except buttock]: Secondary | ICD-10-CM

## 2013-10-29 DIAGNOSIS — Z8669 Personal history of other diseases of the nervous system and sense organs: Secondary | ICD-10-CM | POA: Insufficient documentation

## 2013-10-29 DIAGNOSIS — Z79899 Other long term (current) drug therapy: Secondary | ICD-10-CM | POA: Insufficient documentation

## 2013-10-29 DIAGNOSIS — R21 Rash and other nonspecific skin eruption: Secondary | ICD-10-CM | POA: Insufficient documentation

## 2013-10-29 DIAGNOSIS — R1110 Vomiting, unspecified: Secondary | ICD-10-CM | POA: Insufficient documentation

## 2013-10-29 DIAGNOSIS — I4891 Unspecified atrial fibrillation: Secondary | ICD-10-CM | POA: Insufficient documentation

## 2013-10-29 DIAGNOSIS — E119 Type 2 diabetes mellitus without complications: Secondary | ICD-10-CM | POA: Insufficient documentation

## 2013-10-29 DIAGNOSIS — IMO0001 Reserved for inherently not codable concepts without codable children: Secondary | ICD-10-CM | POA: Insufficient documentation

## 2013-10-29 HISTORY — DX: Obesity, unspecified: E66.9

## 2013-10-29 LAB — CBC WITH DIFFERENTIAL/PLATELET
BASOS PCT: 0 % (ref 0–1)
Basophils Absolute: 0 10*3/uL (ref 0.0–0.1)
EOS ABS: 0.7 10*3/uL (ref 0.0–0.7)
EOS PCT: 6 % — AB (ref 0–5)
HEMATOCRIT: 40 % (ref 39.0–52.0)
HEMOGLOBIN: 14.4 g/dL (ref 13.0–17.0)
LYMPHS ABS: 1.8 10*3/uL (ref 0.7–4.0)
Lymphocytes Relative: 14 % (ref 12–46)
MCH: 28.8 pg (ref 26.0–34.0)
MCHC: 36 g/dL (ref 30.0–36.0)
MCV: 80 fL (ref 78.0–100.0)
MONO ABS: 1.5 10*3/uL — AB (ref 0.1–1.0)
MONOS PCT: 11 % (ref 3–12)
NEUTROS PCT: 69 % (ref 43–77)
Neutro Abs: 8.8 10*3/uL — ABNORMAL HIGH (ref 1.7–7.7)
Platelets: 338 10*3/uL (ref 150–400)
RBC: 5 MIL/uL (ref 4.22–5.81)
RDW: 13 % (ref 11.5–15.5)
WBC: 12.8 10*3/uL — ABNORMAL HIGH (ref 4.0–10.5)

## 2013-10-29 LAB — POCT I-STAT, CHEM 8
BUN: 13 mg/dL (ref 6–23)
CALCIUM ION: 1.13 mmol/L (ref 1.12–1.23)
CREATININE: 0.9 mg/dL (ref 0.50–1.35)
Chloride: 99 mEq/L (ref 96–112)
GLUCOSE: 227 mg/dL — AB (ref 70–99)
HEMATOCRIT: 45 % (ref 39.0–52.0)
HEMOGLOBIN: 15.3 g/dL (ref 13.0–17.0)
POTASSIUM: 3.8 meq/L (ref 3.7–5.3)
Sodium: 137 mEq/L (ref 137–147)
TCO2: 25 mmol/L (ref 0–100)

## 2013-10-29 LAB — URINALYSIS, ROUTINE W REFLEX MICROSCOPIC
BILIRUBIN URINE: NEGATIVE
Glucose, UA: 250 mg/dL — AB
KETONES UR: 15 mg/dL — AB
Leukocytes, UA: NEGATIVE
NITRITE: NEGATIVE
PH: 5 (ref 5.0–8.0)
Protein, ur: NEGATIVE mg/dL
SPECIFIC GRAVITY, URINE: 1.034 — AB (ref 1.005–1.030)
Urobilinogen, UA: 0.2 mg/dL (ref 0.0–1.0)

## 2013-10-29 LAB — GLUCOSE, CAPILLARY: Glucose-Capillary: 223 mg/dL — ABNORMAL HIGH (ref 70–99)

## 2013-10-29 LAB — URINE MICROSCOPIC-ADD ON

## 2013-10-29 MED ORDER — VANCOMYCIN HCL IN DEXTROSE 1-5 GM/200ML-% IV SOLN
1000.0000 mg | Freq: Once | INTRAVENOUS | Status: AC
  Administered 2013-10-29: 1000 mg via INTRAVENOUS
  Filled 2013-10-29: qty 200

## 2013-10-29 MED ORDER — TRAMADOL HCL 50 MG PO TABS: 50.0000 mg | ORAL_TABLET | Freq: Four times a day (QID) | ORAL | Status: DC | PRN

## 2013-10-29 MED ORDER — DOXYCYCLINE HYCLATE 100 MG PO CAPS: 100.0000 mg | ORAL_CAPSULE | Freq: Two times a day (BID) | ORAL | Status: DC

## 2013-10-29 MED ORDER — SODIUM CHLORIDE 0.9 % IV BOLUS (SEPSIS)
1000.0000 mL | Freq: Once | INTRAVENOUS | Status: AC
  Administered 2013-10-29: 1000 mL via INTRAVENOUS

## 2013-10-29 MED ORDER — LIDOCAINE HCL (PF) 1 % IJ SOLN
5.0000 mL | Freq: Once | INTRAMUSCULAR | Status: AC
  Administered 2013-10-29: 5 mL via INTRADERMAL
  Filled 2013-10-29: qty 5

## 2013-10-29 NOTE — Discharge Instructions (Signed)
Please call your doctor for a followup appointment within 24-48 hours. When you talk to your doctor please let them know that you were seen in the emergency department and have them acquire all of your records so that they can discuss the findings with you and formulate a treatment plan to fully care for your new and ongoing problems.  Take the doxycycline twice daily for the next 10 days.  There is some redness surrounding the abscess that we have drained.  Please return to the ER if the redness spreads or becomes severe pain.

## 2013-10-29 NOTE — ED Notes (Signed)
Pt reports he thinks he is having flu-like symptoms such as soreness, "real brown pee but I dont know, I had the flu shot. I have no appetite and I threw up one time and when I go to the bathroom for a bowel movement it was real runny this morning but later today my stomach was cramping but I wasn't able to do nothing."

## 2013-10-29 NOTE — ED Notes (Signed)
PA Student at bedside

## 2013-10-29 NOTE — ED Provider Notes (Signed)
CSN: [id number]     Arrival date & time 10/29/13  0005 History   First MD Initiated Contact with Patient 10/29/13 0206     Chief Complaint  Patient presents with  . Abscess   (Consider location/radiation/quality/duration/timing/severity/associated sxs/prior Treatment) HPI Comments: Pt presents with 10 days of R shoulder posterior redness, gradually worsened and is now draining pus - he is having myalgias and decreased volume of urine - one episode of vomiting prior to arrival.  He has been taking keflex without improvement.   Sx are persistent, gradually worsening.  Patient is a 43 y.o. male presenting with abscess. The history is provided by the patient and medical records.  Abscess   Past Medical History  Diagnosis Date  . Hypertension   . Diabetes mellitus   . Sleep apnea   . Morbid obesity   . History of cocaine use   . History of alcohol abuse   . Paroxysmal atrial fibrillation     Occurring in 2008, with several recurrence since then (including in the setting of + cocaine on UDS).  . Obesity    Past Surgical History  Procedure Laterality Date  . Appendectomy    . Rotator cuff repair    . Tonsillectomy     Family History  Problem Relation Age of Onset  . Heart attack Father   . Diabetes Mother    History  Substance Use Topics  . Smoking status: Former Smoker -- 0.10 packs/day for 10 years    Quit date: 10/17/2007  . Smokeless tobacco: Current User    Types: Snuff     Comment: smokes one pack per month  . Alcohol Use: Yes     Comment: occasionally - 1 beer 1x/mont    Review of Systems  All other systems reviewed and are negative.    Allergies  Shrimp; Almond oil; Banana; Sulfa antibiotics; and Watermelon flavor  Home Medications   Current Outpatient Rx  Name  Route  Sig  Dispense  Refill  . atorvastatin (LIPITOR) 10 MG tablet   Oral   Take 10 mg by mouth daily.         . cephALEXin (KEFLEX) 500 MG capsule   Oral   Take 1 capsule (500 mg total)  by mouth 4 (four) times daily.   27 capsule   0   . diltiazem (CARDIZEM CD) 360 MG 24 hr capsule   Oral   Take 360 mg by mouth daily.         . flecainide (TAMBOCOR) 100 MG tablet   Oral   Take 1 tablet by mouth 2 (two) times daily.         . fluticasone (FLONASE) 50 MCG/ACT nasal spray   Nasal   Place 1 spray into the nose daily.         Marland Kitchen glipiZIDE (GLUCOTROL) 10 MG 24 hr tablet   Oral   Take 10 mg by mouth Twice daily.         Marland Kitchen LANTUS SOLOSTAR 100 UNIT/ML injection   Subcutaneous   Inject 30 Units into the skin daily with breakfast.          . lisinopril-hydrochlorothiazide (PRINZIDE,ZESTORETIC) 10-12.5 MG per tablet   Oral   Take 1 tablet by mouth daily.         . metFORMIN (GLUCOPHAGE) 500 MG tablet   Oral   Take 1,000 mg by mouth 2 (two) times daily.          Carlena Hurl 20 MG  TABS   Oral   Take 1 tablet by mouth daily.         Marland Kitchen doxycycline (VIBRAMYCIN) 100 MG capsule   Oral   Take 1 capsule (100 mg total) by mouth 2 (two) times daily.   20 capsule   0   . ONE TOUCH ULTRA TEST test strip      Use as directed - 4 times daily         . traMADol (ULTRAM) 50 MG tablet   Oral   Take 1 tablet (50 mg total) by mouth every 6 (six) hours as needed.   15 tablet   0    BP 123/77  Pulse 87  Temp(Src) 99 F (37.2 C) (Oral)  Resp 20  SpO2 98% Physical Exam  Nursing note and vitals reviewed. Constitutional: He appears well-developed and well-nourished. No distress.  HENT:  Head: Normocephalic and atraumatic.  Mouth/Throat: Oropharynx is clear and moist. No oropharyngeal exudate.  Eyes: Conjunctivae and EOM are normal. Pupils are equal, round, and reactive to light. Right eye exhibits no discharge. Left eye exhibits no discharge. No scleral icterus.  Neck: Normal range of motion. Neck supple. No JVD present. No thyromegaly present.  Cardiovascular: Normal rate, regular rhythm, normal heart sounds and intact distal pulses.  Exam reveals no  gallop and no friction rub.   No murmur heard. Pulmonary/Chest: Effort normal and breath sounds normal. No respiratory distress. He has no wheezes. He has no rales.  Abdominal: Soft. Bowel sounds are normal. He exhibits no distension and no mass. There is no tenderness.  Musculoskeletal: Normal range of motion. He exhibits no edema and no tenderness.  Lymphadenopathy:    He has no cervical adenopathy.  Neurological: He is alert. Coordination normal.  Skin: Skin is warm and dry. Rash noted. There is erythema.  Area of approximately 8 cm of erythema and induration with a central area of fluctuance which is spontaneously draining through a small sinus, purulent material found.  Psychiatric: He has a normal mood and affect. His behavior is normal.    ED Course  Procedures (including critical care time) Labs Review Labs Reviewed  URINALYSIS, ROUTINE W REFLEX MICROSCOPIC - Abnormal; Notable for the following:    Color, Urine AMBER (*)    Specific Gravity, Urine 1.034 (*)    Glucose, UA 250 (*)    Hgb urine dipstick TRACE (*)    Ketones, ur 15 (*)    All other components within normal limits  CBC WITH DIFFERENTIAL - Abnormal; Notable for the following:    WBC 12.8 (*)    Neutro Abs 8.8 (*)    Monocytes Absolute 1.5 (*)    Eosinophils Relative 6 (*)    All other components within normal limits  GLUCOSE, CAPILLARY - Abnormal; Notable for the following:    Glucose-Capillary 223 (*)    All other components within normal limits  URINE MICROSCOPIC-ADD ON - Abnormal; Notable for the following:    Casts HYALINE CASTS (*)    All other components within normal limits  POCT I-STAT, CHEM 8 - Abnormal; Notable for the following:    Glucose, Bld 227 (*)    All other components within normal limits   Imaging Review No results found.  EKG Interpretation   None       MDM   1. Abscess of back   2. Dehydration   3. Cellulitis of back    Vital signs are reassuring, the patient does have a  large abscess  to his posterior shoulder on the right, this will need to be drained, check CBG as he is not checking this at home though he is adamant that he has been compliant with his diabetic medications. He has not been making much urine, well-hydrated, give vancomycin IV, transition onto doxycycline at home as he does express an allergy to sulfa drugs.  INCISION AND DRAINAGE Performed by: Eber HongMILLER,Bailea Beed D Consent: Verbal consent obtained. Risks and benefits: risks, benefits and alternatives were discussed Type: abscess  Body area: R upper back  Anesthesia: local infiltration  Incision was made with a scalpel.  Local anesthetic: lidocaine 1% without epinephrine  Anesthetic total: 7 ml  Complexity: complex Blunt dissection to break up loculations  Drainage: purulent  Drainage amount: large amount of purulent  Packing material: 1/2 in iodoform gauze  Patient tolerance: Patient tolerated the procedure well with no immediate complications.  Because of the surrounding cellulitis - the pt was given vancomycin - he will be started on doxycycline - large dressing placed over the wound.  Pt instructed on wound care and need for close follow up for packing change / removal and wound check - he states that though he has allergy to sulfa drugs, he can take doxycyline without difficulty and has had in past.  Meds given in ED:  Medications  sodium chloride 0.9 % bolus 1,000 mL (0 mLs Intravenous Stopped 10/29/13 0522)  vancomycin (VANCOCIN) IVPB 1000 mg/200 mL premix (0 mg Intravenous Stopped 10/29/13 0522)  lidocaine (PF) (XYLOCAINE) 1 % injection 5 mL (5 mLs Intradermal Given 10/29/13 0429)    New Prescriptions   DOXYCYCLINE (VIBRAMYCIN) 100 MG CAPSULE    Take 1 capsule (100 mg total) by mouth 2 (two) times daily.   TRAMADOL (ULTRAM) 50 MG TABLET    Take 1 tablet (50 mg total) by mouth every 6 (six) hours as needed.       Vida RollerBrian D Belvie Iribe, MD 10/29/13 [phone]

## 2013-10-29 NOTE — ED Notes (Signed)
Pt is unable to give urine specimen at this time. The patient has been advised to use call light for assistance. The tech has reported to the RN in charge.

## 2013-10-29 NOTE — ED Notes (Signed)
Pt. reports persistent abscess at right upper scapula area with drainage for several days .

## 2013-10-29 NOTE — ED Notes (Signed)
Dr. Hyacinth MeekerMiller at bedside performing incision and drainage of abscess.

## 2013-11-14 ENCOUNTER — Encounter: Payer: Self-pay | Admitting: Internal Medicine

## 2013-11-14 ENCOUNTER — Ambulatory Visit (INDEPENDENT_AMBULATORY_CARE_PROVIDER_SITE_OTHER): Payer: 59 | Admitting: Internal Medicine

## 2013-11-14 VITALS — BP 122/76 | HR 101 | Ht 69.0 in | Wt 321.0 lb

## 2013-11-14 DIAGNOSIS — F1410 Cocaine abuse, uncomplicated: Secondary | ICD-10-CM

## 2013-11-14 DIAGNOSIS — E119 Type 2 diabetes mellitus without complications: Secondary | ICD-10-CM

## 2013-11-14 DIAGNOSIS — G4733 Obstructive sleep apnea (adult) (pediatric): Secondary | ICD-10-CM

## 2013-11-14 DIAGNOSIS — G47411 Narcolepsy with cataplexy: Secondary | ICD-10-CM

## 2013-11-14 NOTE — Progress Notes (Signed)
01/25/12- [age] yoM former smoker comes to reestablish for management of obstructive sleep apnea complicated by history of allergic rhinitis and atrial fibrillation, diabetes, HTN. PCP- Dr Trula Sladeon Polite NPSG 11/23/00- AHI 20 per hour. He continues using CPAP all night every night. 14 CWP/[facility]. Using a fullface mask but putting it on so only covers his nose,  otherwise it smothers him. Fighting daytime sleepiness which affects his work. Hypnagogic hallucination associated with dreaming. 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 Vivid dreams as soon as he falls asleep. Wakes irritable from naps. Bedtime 9:30 PM, very brief sleep latency, wakes 4 times for nocturia before up at 4:30 AM. No sleeping pill. 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  Quit smoking 2009, 1 beer/ month, little caffeine. Unmarried, living w/ sister, has girlfriend Fam hx- Father hx MI, Mother dcs'd DM  05/09/12- [age] yoM former smoker comes to reestablish for management of obstructive sleep apnea complicated by history of allergic rhinitis and atrial fibrillation, diabetes, HTN. PCP- Dr Trula Sladeon [facility] He returns to followup and reports use of CPAP 13/ [facility] most nights, all night " a whole 'nother world". He sleeps soundly and now with CPAP he is sleeping through his morning alarm clock which has caused him to miss work. Bedtime is 9:30 PM and he is trying to get up at 5:15 AM. Still drowsy after meals and if sitting quietly. He also has an urge to sleep if he gets angry and at that time recognizes dreaming. He does not specifically describe muscle weakness with these episodes. He gives a possible history of sleep paralysis.  10/21/12- 41 yoM former smoker followed for management of obstructive sleep apnea/ hypersomnia complicated by history of allergic rhinitis and atrial fibrillation, diabetes, HTN. PCP- Dr Trula Sladeon [facility] FOLLOWS FOR: using CPAP/ 13/ [facility] every night for 5 hours per night. denies any  problems with the machine or the mask. He is still fighting daytime sleepiness and falling asleep at work. He struggles with this because he has to drive. Strong emotion makes him feel weak so that he needs to sit down. He recognizes sleep paralysis as an occasional event as well as hypnagogic hallucination. Caffeine has little daytime effect. 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 Incidental request for referral to an eye doctor because of his diabetes. He would like to go back to Dr. Luciana Axeankin who had seen him in the past. Rhinitis is improved by Flonase, asks refill.  01/14/13-41 yoM former smoker followed for management of obstructive sleep apnea/ hypersomnia complicated by history of allergic rhinitis and atrial fibrillation, diabetes, HTN. PCP- Dr Trula Sladeon [facility] FOLLOWS FOR: Wears CPAP 13/ [employer] every night for about 6-8 hours; pressure working well for patient.  He still can get drowsy if he sits quietly. Caffeine has no effect. Nuvigil was no help. Multiple Sleep Latency Test 10/30/2012-pathologic daytime hypersomnia, nonspecific, compatible with idiopathic hypersomnia or narcolepsy. Mean latency 0.9 minutes with one sleep onset REM event. He has been taken off of driving at work, he says because of an issue with his blood sugar being too high.  02/06/13- 41 yoM former smoker followed for management of obstructive sleep apnea and hypersomnia/ narcolepsy with cataplexy complicated by history of allergic rhinitis and atrial fibrillation, diabetes, HTN. PCP- Dr Trula Sladeon [facility] FOLLOWS FOR: wears CPAP every night for about 7 hours;pressure doing well. Recent hospital stay Hospital twice for AFib/ CHF. Also question of pneumonia. We discussed  potential for methylphenidate to precipitate atrial fibrillation which was a chronic diagnosis for him. He thinks it may be more anxious in the morning, not necessarily more alert. 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 He now describes waking with sleep paralysis and recognizes my description of hypnagogic hallucination. If startled he gets an uncomfortable weak aching feeling in the back of his neck, his knees get weak, sounds like cataplexy. I had not gotten is clear description before.  03/03/13- [age] yoM former smoker followed for management of obstructive sleep apnea and hypersomnia/ narcolepsy with cataplexy complicated by history of allergic rhinitis and atrial fibrillation, diabetes, HTN. PCP- Dr Trula Sladeon Polite 1 month follow up.  Wearing cpap 13/ [facility] everynight for approx 7 hours.  No problems with mask or pressure, he does follow some air. Still having daytime sleepimess.  Adderall helps him feel alert but lasts only 2 hours, and he still does. 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 Pressure reduced to 11 due to gas pains.   05/05/13- 41 yoM former smoker followed for management of obstructive sleep apnea and hypersomnia/ narcolepsy with cataplexy complicated by history of allergic rhinitis and atrial fibrillation, diabetes, HTN. PCP- Dr Trula Sladeon [facility] FOLLOWS FOR: pt reports wearing CPAP11/ [employer] machine everynight x 7 hrs per night -- pt reports pressure setting is comfortable no supplies needed-- denies any other concerns at this time Continues Dexedrine, one at 5 AM and one at lunch time. Any later in the day interferes with sleep at night. 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  11/14/13- 41 yoM former smoker followed for management of obstructive sleep apnea and hypersomnia/ narcolepsy with cataplexy complicated by history of allergic rhinitis and atrial fibrillation, diabetes, HTN. PCP- Dr Trula Sladeon [facility]11/ [facility] all night every night Working for his [employer] when he can.  Signed for total disability as of 01/17/2013. Sleep is about the same. He paces  himself during the day. Stopped Dexedrine. CXR 01/23/13 IMPRESSION:  Cardiomegaly prominent perihilar markings consistent with  bronchitis and possibly mild pulmonary vascular congestion.  Consider follow-up.  Original Report Authenticated By: Dwyane DeePaul Barry, [name]   ROS-see HPI Constitutional:   No-   weight loss, night sweats, fevers, chills,  +fatigue, lassitude. HEENT:   No-  headaches, difficulty swallowing, tooth/dental problems, sore throat,       No-  sneezing, itching, ear ache, nasal congestion, post nasal drip,  CV:  No-   chest pain, orthopnea, PND, swelling in lower extremities, anasarca, dizziness, palpitations Resp: No-   shortness of breath with exertion or at rest.              No-   productive cough,  No non-productive cough,  No- coughing up of blood.              No-   change in color of mucus.  No- wheezing.   Skin: No-   rash or lesions. GI:  No-   heartburn, indigestion, abdominal pain, nausea, vomiting,  GU:  MS:  No-   joint pain or swelling.  Neuro-     nothing unusual Psych:  No- change in mood or affect. No depression or anxiety.  No memory loss.  OBJ- Physical Exam General- Alert/ calm, Oriented, Affect-appropriate, Distress- none acute, obese,  Skin- rash-none, lesions- none, excoriation- none Lymphadenopathy- none Head- atraumatic  Eyes- Gross vision intact, PERRLA, conjunctivae and secretions clear            Ears- Hearing, canals-normal            Nose- rhinitis/ turbinate edema, no-Septal dev, mucus, polyps, erosion, perforation             Throat- Mallampati III , mucosa clear , drainage- none, tonsils- atrophic Neck- flexible , trachea midline, no stridor , thyroid nl, carotid no bruit Chest - symmetrical excursion , unlabored           Heart/CV- RRR , no murmur , no gallop  , no rub, nl s1 s2                           - JVD- none , edema- none, stasis changes- none, varices- none           Lung- clear to P&A, wheeze- none, cough- none ,  dullness-none, rub- none           Chest wall-  Abd-  Br/ Gen/ Rectal- Not done, not indicated Extrem- cyanosis- none, clubbing, none, atrophy- none, strength- nl Neuro- grossly intact to observation

## 2013-11-14 NOTE — Patient Instructions (Addendum)
We can continue CPAP 11/ [facility]  Please keep trying to lose some weight, and be sure to drive safely.  Patient would like referral to establish with primary care for dx diabetes

## 2013-11-19 ENCOUNTER — Encounter (HOSPITAL_COMMUNITY): Payer: Self-pay | Admitting: Emergency Medicine

## 2013-11-19 ENCOUNTER — Emergency Department (HOSPITAL_COMMUNITY)
Admission: EM | Admit: 2013-11-19 | Discharge: 2013-11-19 | Disposition: A | Discharge status: Home / Self Care | Payer: 59 | Attending: Emergency Medicine | Admitting: Emergency Medicine

## 2013-11-19 ENCOUNTER — Emergency Department (HOSPITAL_COMMUNITY): Payer: 59

## 2013-11-19 DIAGNOSIS — IMO0002 Reserved for concepts with insufficient information to code with codable children: Secondary | ICD-10-CM | POA: Insufficient documentation

## 2013-11-19 DIAGNOSIS — I4891 Unspecified atrial fibrillation: Secondary | ICD-10-CM | POA: Insufficient documentation

## 2013-11-19 DIAGNOSIS — R5383 Other fatigue: Secondary | ICD-10-CM

## 2013-11-19 DIAGNOSIS — R509 Fever, unspecified: Secondary | ICD-10-CM | POA: Insufficient documentation

## 2013-11-19 DIAGNOSIS — I10 Essential (primary) hypertension: Secondary | ICD-10-CM | POA: Insufficient documentation

## 2013-11-19 DIAGNOSIS — Z794 Long term (current) use of insulin: Secondary | ICD-10-CM | POA: Insufficient documentation

## 2013-11-19 DIAGNOSIS — Z87891 Personal history of nicotine dependence: Secondary | ICD-10-CM | POA: Insufficient documentation

## 2013-11-19 DIAGNOSIS — Z8659 Personal history of other mental and behavioral disorders: Secondary | ICD-10-CM | POA: Insufficient documentation

## 2013-11-19 DIAGNOSIS — R5381 Other malaise: Secondary | ICD-10-CM | POA: Insufficient documentation

## 2013-11-19 DIAGNOSIS — Z79899 Other long term (current) drug therapy: Secondary | ICD-10-CM | POA: Insufficient documentation

## 2013-11-19 DIAGNOSIS — E119 Type 2 diabetes mellitus without complications: Secondary | ICD-10-CM | POA: Insufficient documentation

## 2013-11-19 DIAGNOSIS — J3489 Other specified disorders of nose and nasal sinuses: Secondary | ICD-10-CM | POA: Insufficient documentation

## 2013-11-19 LAB — CBC WITH DIFFERENTIAL/PLATELET
Basophils Absolute: 0 10*3/uL (ref 0.0–0.1)
Basophils Relative: 0 % (ref 0–1)
EOS PCT: 3 % (ref 0–5)
Eosinophils Absolute: 0.2 10*3/uL (ref 0.0–0.7)
HCT: 36.8 % — ABNORMAL LOW (ref 39.0–52.0)
Hemoglobin: 13 g/dL (ref 13.0–17.0)
LYMPHS ABS: 1 10*3/uL (ref 0.7–4.0)
LYMPHS PCT: 12 % (ref 12–46)
MCH: 28.4 pg (ref 26.0–34.0)
MCHC: 35.3 g/dL (ref 30.0–36.0)
MCV: 80.3 fL (ref 78.0–100.0)
Monocytes Absolute: 1.5 10*3/uL — ABNORMAL HIGH (ref 0.1–1.0)
Monocytes Relative: 17 % — ABNORMAL HIGH (ref 3–12)
Neutro Abs: 5.9 10*3/uL (ref 1.7–7.7)
Neutrophils Relative %: 68 % (ref 43–77)
Platelets: 274 10*3/uL (ref 150–400)
RBC: 4.58 MIL/uL (ref 4.22–5.81)
RDW: 12.9 % (ref 11.5–15.5)
WBC: 8.6 10*3/uL (ref 4.0–10.5)

## 2013-11-19 LAB — URINALYSIS, ROUTINE W REFLEX MICROSCOPIC
Glucose, UA: NEGATIVE mg/dL
KETONES UR: NEGATIVE mg/dL
Leukocytes, UA: NEGATIVE
NITRITE: NEGATIVE
PROTEIN: 100 mg/dL — AB
Specific Gravity, Urine: >1.03 — ABNORMAL HIGH (ref 1.005–1.030)
UROBILINOGEN UA: 1 mg/dL (ref 0.0–1.0)
pH: 5 (ref 5.0–8.0)

## 2013-11-19 LAB — URINE MICROSCOPIC-ADD ON

## 2013-11-19 LAB — COMPREHENSIVE METABOLIC PANEL
ALK PHOS: 135 U/L — AB (ref 39–117)
ALT: 28 U/L (ref 0–53)
AST: 28 U/L (ref 0–37)
Albumin: 3 g/dL — ABNORMAL LOW (ref 3.5–5.2)
BILIRUBIN TOTAL: 0.6 mg/dL (ref 0.3–1.2)
BUN: 14 mg/dL (ref 6–23)
CHLORIDE: 92 meq/L — AB (ref 96–112)
CO2: 24 meq/L (ref 19–32)
Calcium: 8.6 mg/dL (ref 8.4–10.5)
Creatinine, Ser: 0.89 mg/dL (ref 0.50–1.35)
GLUCOSE: 251 mg/dL — AB (ref 70–99)
Potassium: 4.2 mEq/L (ref 3.7–5.3)
SODIUM: 130 meq/L — AB (ref 137–147)
Total Protein: 7.7 g/dL (ref 6.0–8.3)

## 2013-11-19 LAB — GLUCOSE, CAPILLARY: Glucose-Capillary: 253 mg/dL — ABNORMAL HIGH (ref 70–99)

## 2013-11-19 IMAGING — CR DG CHEST 2V
2 series · 2 of 2 positions shown · non-contrast
Comparison: DG CHEST 2 VIEW dated [DATE]

CLINICAL DATA: Shortness of breath. Fatigue. Dehydration. Emesis.

EXAM:
CHEST  2 VIEW

[w chest pa]
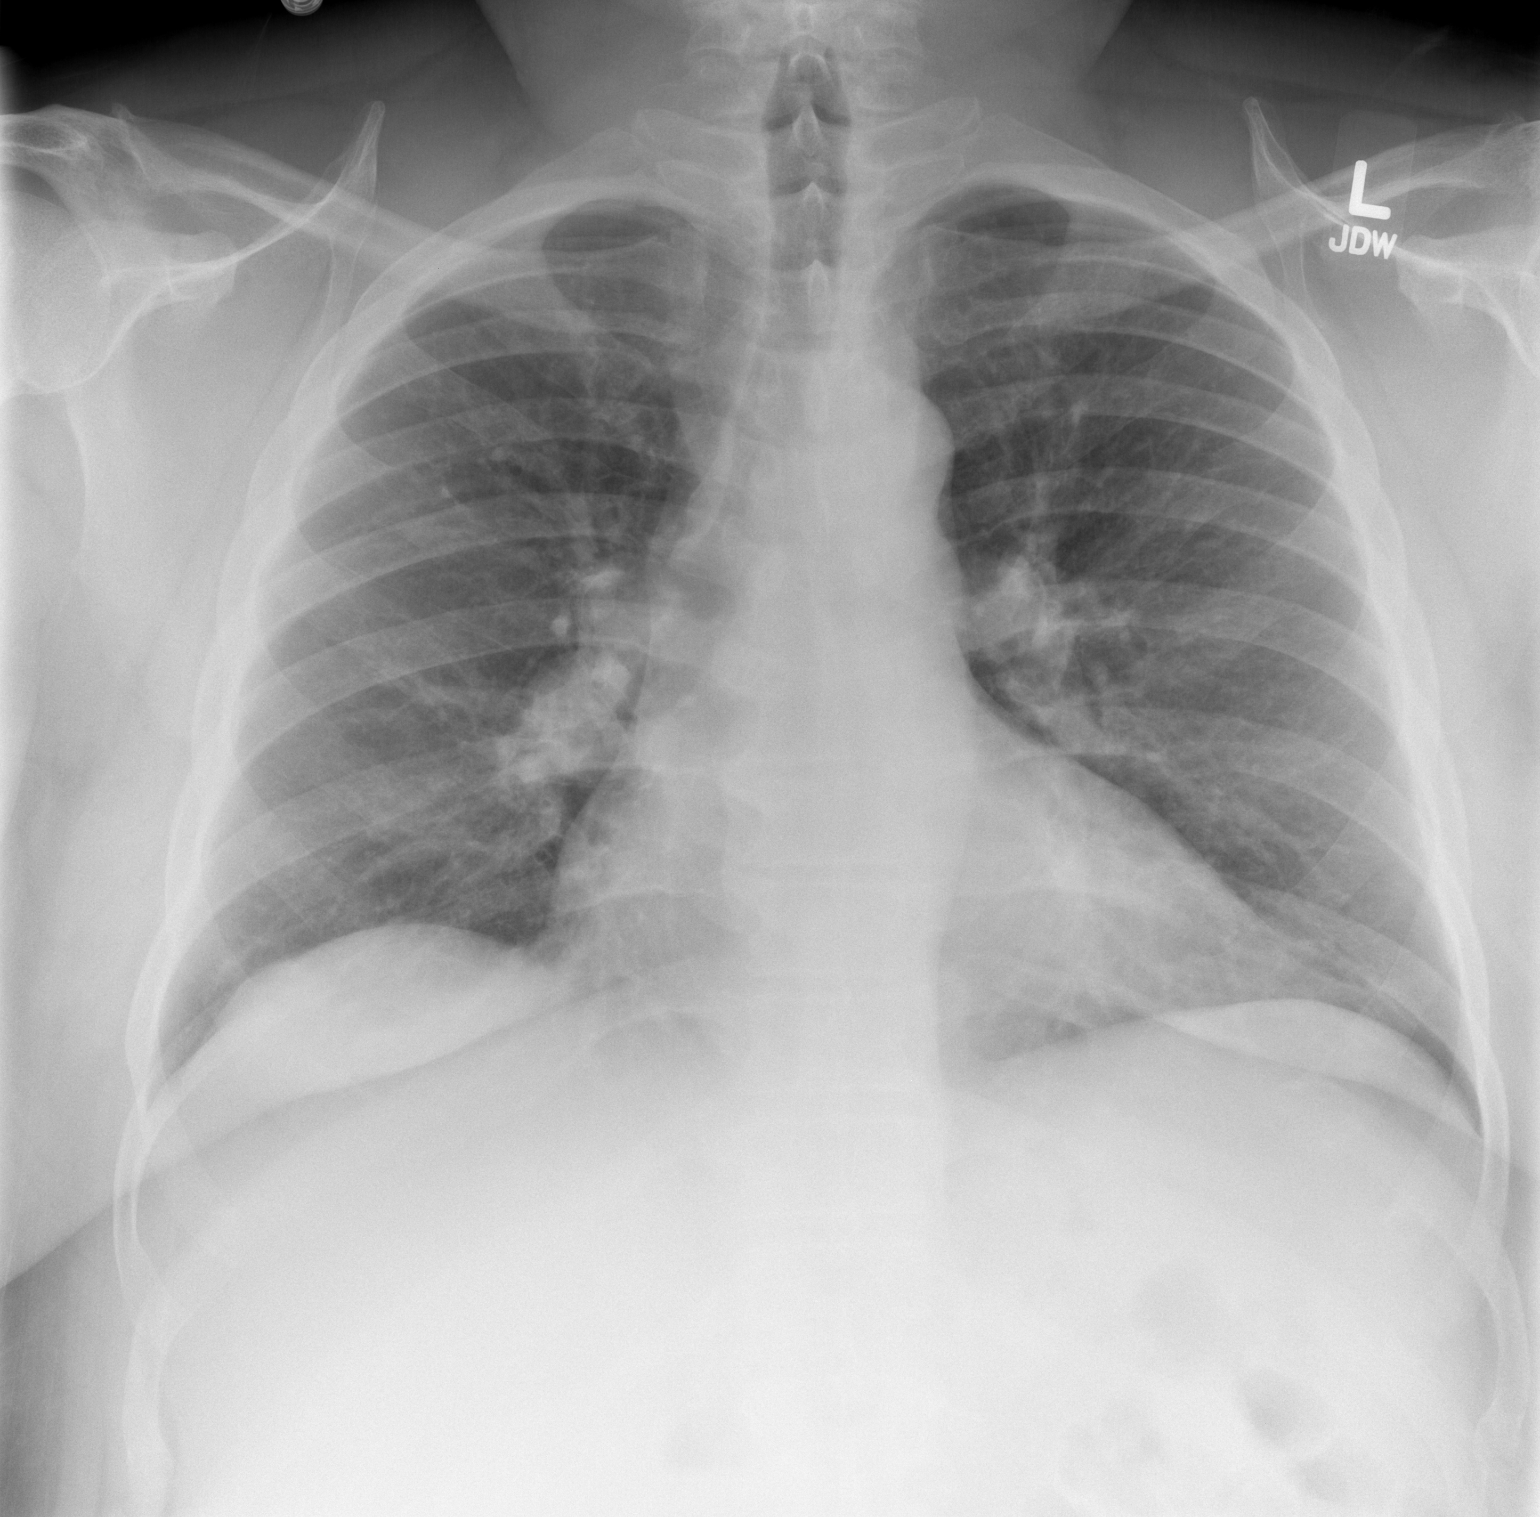

[w chest lat *]
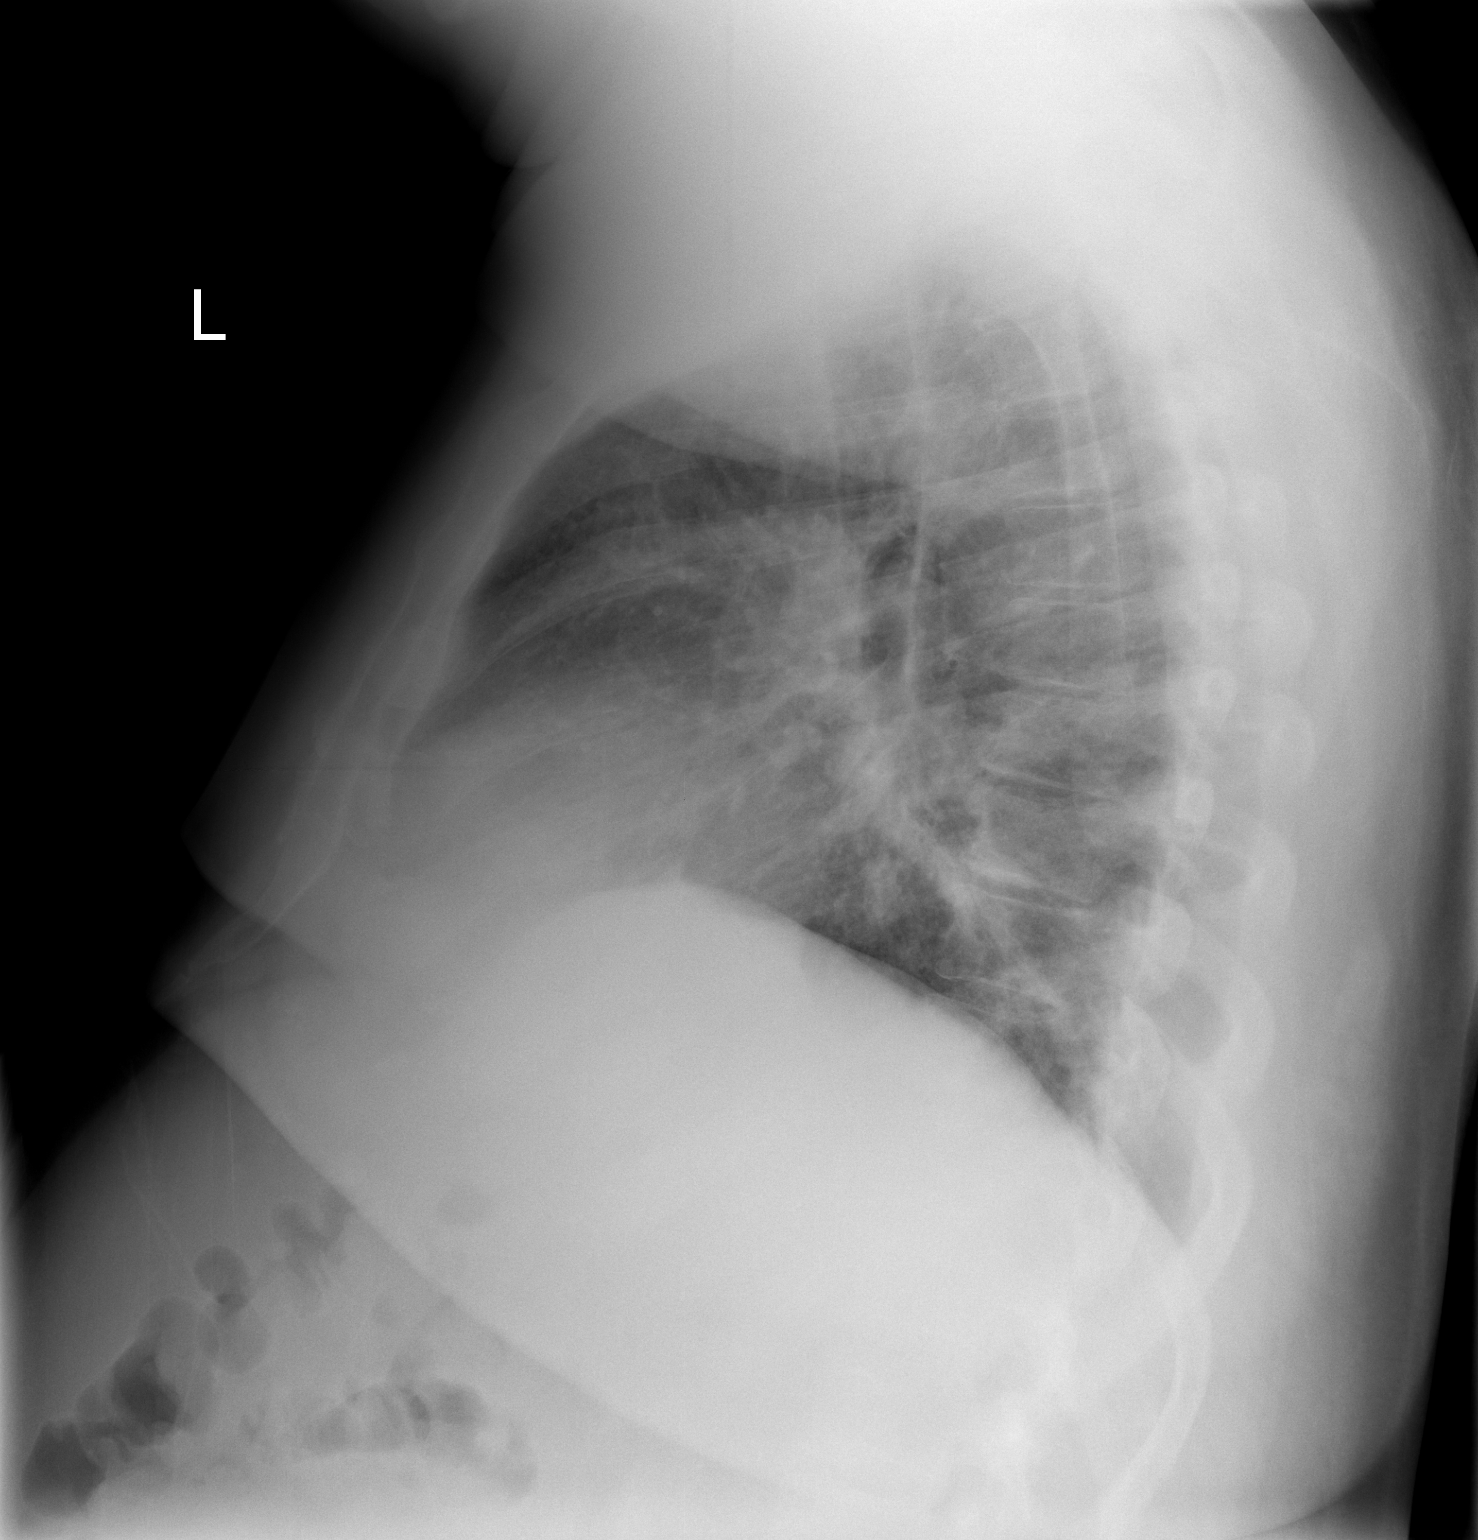

[2 of 2 positions shown; findings below may reference images not displayed]

FINDINGS: Stable mild enlargement of the cardiopericardial silhouette. No
airspace opacity. Prior interstitial accentuation has resolved. No
current edema or pleural effusion.
IMPRESSION: 1. Mild cardiomegaly. Otherwise, no significant abnormalities are
observed.

## 2013-11-19 MED ORDER — ACETAMINOPHEN 500 MG PO TABS
1000.0000 mg | ORAL_TABLET | Freq: Once | ORAL | Status: AC
  Administered 2013-11-19: 1000 mg via ORAL
  Filled 2013-11-19: qty 2

## 2013-11-19 NOTE — Discharge Instructions (Signed)
°Emergency Department Resource Guide °1) Find a Doctor and Pay Out of Pocket °Although you won't have to find out who is covered by your insurance plan, it is a good idea to ask around and get recommendations. You will then need to call the office and see if the doctor you have chosen will accept you as a new patient and what types of options they offer for patients who are self-pay. Some doctors offer discounts or will set up payment plans for their patients who do not have insurance, but you will need to ask so you aren't surprised when you get to your appointment. ° °2) Contact Your Local Health Department °Not all health departments have doctors that can see patients for sick visits, but many do, so it is worth a call to see if yours does. If you don't know where your local health department is, you can check in your phone book. The CDC also has a tool to help you locate your state's health department, and many state websites also have listings of all of their local health departments. ° °3) Find a Walk-in Clinic °If your illness is not likely to be very severe or complicated, you may want to try a walk in clinic. These are popping up all over the country in pharmacies, drugstores, and shopping centers. They're usually staffed by nurse practitioners or physician assistants that have been trained to treat common illnesses and complaints. They're usually fairly quick and inexpensive. However, if you have serious medical issues or chronic medical problems, these are probably not your best option. ° °No Primary Care Doctor: °- Call [facility] Connect at  832-8000 - they can help you locate a primary care doctor that  accepts your insurance, provides certain services, etc. °- Physician Referral Service- 1-800-533-3463 ° °Chronic Pain Problems: °Organization         Address  Phone   Notes  °[facility]  (336) [phone] Patients need to be referred by their primary care doctor.  ° °Medication  Assistance: °Organization         Address  Phone   Notes  °[place] [facility] [address] °[place], [place] [address] (336) 641-8030 --Must be a resident of [place] °-- Must have NO insurance coverage whatsoever (no Medicaid/ Medicare, etc.) °-- The pt. MUST have a primary care doctor that directs their care regularly and follows them in the community °  °[facility]  (866) 331-1348   °[employer]  (888) 892-1162   ° °Agencies that provide inexpensive medical care: °Organization         Address  Phone   Notes  °[facility]  (336) 832-8035   °[facility]    (336) 832-7272   °[facility] [address] °[place], [place] [address] (336) 832-4777   °[facility] [address], °[place] (336) 271-4999   °[facility]    (336) 373-0678   °[facility]    (336) 272-1050   °[facility] ° [address], [place] Phone:  (336) 832-4444, Fax:  (336) 832-4440 Hours of Operation:  9 am - 6 pm, M-F.  Also accepts Medicaid/Medicare and self-pay.  °[facility] ° [address], [place] Phone: (336) [phone], Fax: (336) [phone]. Hours of Operation:  8:30 am - 5:30 pm, M-F.  Also accepts Medicaid and self-pay.  °[facility] [place] [address], [place] Phone: (336) 878-6027   °[facility] [address], [place], [place] (336)723-1848, Ext. 123 Mondays & Thursdays: 7-9 AM.  First 15 patients are seen on a first come, first serve basis. °  ° °Medicaid-accepting [place] Providers: ° °Organization         Address  Phone   Notes  °[facility] [address], [place] (336) 641-2100 Also accepts self-pay patients.  °[facility] [address], [place] ° (336) 856-9996   °[facility] [address], [place]  (336) 288-8857   °[facility] [address] [place] [address], [place] (336) 299-7000   °Veita Bland [address], [place]  ° (336) 373-1557 Only accepts [place] Access Medicaid patients after they have their name applied to their card.  ° °Self-Pay (no insurance) in [place]: ° °Organization         Address  Phone   Notes  °Sickle Cell Patients, [facility] [address], [place] (336) 832-1970   °[facility] [address], [place] (336) 832-4400   °[facility] ° [address], [place] (336) 992-4800   °[facility]/Dr. Osei-Bonsu ° [address] [place] [address], [place] or [address], [place] (336) [phone] Phone number for both [place] and [place] locations is the same.  °[facility] [address], [place] (336) 299-0000   °[facility] [address] [place] [address], [place] or [address] (336) 852-7530 °(336) 878-2260   °[facility] [address], [place] (336) 350-1642, phone; (336) 294-5005, fax Sees patients 1st and 3rd Saturday of every month.  Must not qualify for public or private insurance (i.e. Medicaid, Medicare, [place] Health Choice, Veterans' Benefits) • Household income should be no more than 200% of the poverty level •The clinic cannot treat you if you are pregnant or think you are pregnant • Sexually transmitted diseases are not treated at the clinic.  ° ° °Dental Care: °Organization         Address  Phone  Notes  °[place] [facility] [address], [place] (336) 641-6152 Accepts children up to age 21 who are enrolled in Medicaid or [place] Health Choice; pregnant women with a Medicaid card; and children who have applied for Medicaid or [place] Health Choice, but were declined, whose parents can pay a reduced fee at time of service.  °[place]  [facility] [place]  [address], [place] (336) 641-7733 Accepts children up to age 21 who are enrolled in Medicaid or [place] Health Choice; pregnant women with a Medicaid card; and children who have applied for Medicaid or [place] Health Choice, but were declined, whose parents can pay a reduced fee at time of service.  °[facility] ° [address], [place] (336) 641-4533 Patients are seen by appointment only. Walk-ins are not accepted. [facility] will see patients 18 years of age and older. °Monday - Tuesday (8am-5pm) °Most Wednesdays (8:30-5pm) °$30 per visit, cash only  °[facility] ° [address], [place] (336) 641-4533 Patients are seen by appointment only. Walk-ins are not accepted. [facility] will see patients 18 years of age and older. °One   Wednesday Evening (Monthly: Volunteer Based).  $30 per visit, cash only  °[facility]  (919) 537-3737 for adults; Children under age 4, call Graduate Pediatric Dentistry at ([phone]) [phone]. Children aged 4-14, please call ([phone]) [phone] to request a pediatric application. ° Dental services are provided in all areas of dental care including fillings, crowns and bridges, complete and partial dentures, implants, gum treatment, root canals, and extractions. Preventive care is also provided. Treatment is provided to both adults and children. °Patients are selected via a lottery and there is often a waiting list. °  °[facility] [address], °[place] ° (336) 763-8833 [email or web address] °  °[facility] [address], [place], [place] (336)723-1848, Ext. 123 Second and Fourth Thursday of each month, opens at 6:30 AM; Clinic ends at 9 AM.  Patients are seen on a first-come first-served basis, and a limited number are seen during each clinic.  ° °[facility] ° [address], [place], [place] (336) 723-7904    Eligibility Requirements °You must have lived in [place], [place], or [place] counties for at least the last three months. °  You cannot be eligible for state or federal sponsored healthcare insurance, including Veterans Administration, Medicaid, or Medicare. °  You generally cannot be eligible for healthcare insurance through your employer.  °  How to apply: °Eligibility screenings are held every Tuesday and Wednesday afternoon from 1:00 pm until 4:00 pm. You do not need an appointment for the interview!  °[facility] [address], [place], [place] 336-631-2330   °[facility]  336-342-8273   °[facility]  336-703-3100   °[facility]  336-570-6415   ° °Behavioral Health Resources in the Community: °Intensive Outpatient Programs °Organization         Address  Phone  Notes  °[facility] [address], [place], [place] 336-878-6098   °[facility] [address], [place], [place] 336-832-9800   °[facility] [address], [place], [place] ° 336-882-2125   °[facility] [address],  °[place], [place] 1-800-853-5163 or 336-641-4981   °Substance Abuse Resources °Organization         Address  Phone  Notes  °[facility]  336-882-2125   °[facility]  336-784-9470   °[facility]  336-285-9073   °[facility]  336-845-3988   °Residential & Outpatient Substance Abuse Program  1-800-659-3381   °Psychological Services °Organization         Address  Phone  Notes  °[facility]  336- 832-9600   °[facility]  336- 378-7881   °[facility] [address], [place] 1-800-853-5163 or 336-641-4981   ° °Mobile Crisis Teams °Organization         Address  Phone  Notes  °[facility], Mobile Crisis Care Unit  1-877-626-1772   °Assertive °[facility] ° [address]  [place], [place] 336-834-9664   °Sharon DeEsch [address] °[place] 336-554-5454   ° °Self-Help/Support Groups °Organization         Address  Phone             Notes  °[facility] of  - variety of support groups  336- 373-1402 Call for more information  °Narcotics Anonymous (NA), [facility] [address], °[place]  2 meetings at this location  ° °  Residential Treatment Programs Organization         Address  Phone  Notes  [facility] [address],    [place]  [phone]-(902) 083-3895   [facility]  [address], [place] [phone], [place], [place] [phone]   [facility] [address], [place] [phone] Admissions: 8am-3pm M-F  [facility] [address]    [place], [place] [phone]   [facility] [address], [place], [place] [phone]) [phone]   [facility] [address]  [place], [place] [phone]   [facility] Intensive Outpatient [address], [place], [place] [phone]   [facility] ([facility] [address]  [place], [place] [phone]-(662)[phone] or [phone]   [facility] ([facility]) [address] [place], [place] [phone]([phone]) [phone] Accepts Medicaid  [facility] [address]  [place] [phone] Substance Abuse/Addiction Treatment   [facility] Resources Organization         Address  Phone  Notes  [facility]  219-799-5411(888) [phone]   Angie FavaJulie Brannon, PhD [address], [address] [place], [place]   [phone](336) [phone] or [phone](336) ([phone]   [facility]   [address] [place], [place] [phone](336) [phone]   [facility] [address], [place], [place] [phone](336) [phone] Insurance/Medicaid/sponsorship through [facility] [address]                                    [place], [place] [phone](336) [phone] Therapy/tele-psych/case    [facility] [address]   [place], [place] [phone](336) [phone]    Dr. Lolly MustacheArfeen  [phone]) [phone]   [facility]  [facility] 1) [address], [place] 2) [address], [place] 3)  [address], [place] [phone](336) [phone](336) [phone]  [phone](336) ([phone]   [facility] Child Abuse Hotline [phone](336) 986-795-9020 or 3855060469(336) [phone] (After Hours)      Take over the counter tylenol, as directed on packaging, as needed for fever. Increase your fluid intake (ie: Gatorade) for the next several days, as discussed. Call your regular medical doctor tomorrow to schedule a follow up appointment in the next 2 days.  Return to the Emergency Department immediately if worsening.

## 2013-11-19 NOTE — ED Notes (Signed)
Pt has returned from being out of the department; pt placed back on monitor, continuous pulse oximetry and blood pressure cuff 

## 2013-11-19 NOTE — ED Notes (Signed)
Pt states he began having weakness and chills on Thursday.  Pt states he saw his sleep apnea dr Friday and mentioned it to him.  Pt states dr told him he was dehydrated and should drink plenty of fluids.  Pt states his urine was very dark, but now it is lightening up.

## 2013-11-19 NOTE — ED Notes (Signed)
Patient transported to X-ray 

## 2013-11-19 NOTE — ED Notes (Signed)
Pt is aware of need of urine specimen; Clarene DukeMcManus, MD ok'd for a cup of water to drink

## 2013-11-19 NOTE — ED Provider Notes (Signed)
CSN: [id number]     Arrival date & time 11/19/13  1439 History   First MD Initiated Contact with Patient 11/19/13 1743     Chief Complaint  Patient presents with  . Fatigue    HPI Pt was seen at 1825. Per pt, c/o gradual onset and persistence of constant fatigue for the past 1 week. Pt denies any other symptoms. States he was evaluated by his Pulmonary MD last week for same and was told to "drink plenty of fluids." Pt states he continues to "just feel tired." Denies fevers, no sore throat, no rash, no CP/palpitations, no SOB/cough, no abd pain, no N/V/D, no visual changes, no focal motor weakness, no tingling/numbness in extremities, no ataxia, no slurred speech, no facial droop.     Past Medical History  Diagnosis Date  . Hypertension   . Diabetes mellitus   . Sleep apnea   . Morbid obesity   . History of cocaine use   . History of alcohol abuse   . Paroxysmal atrial fibrillation     Occurring in 2008, with several recurrence since then (including in the setting of + cocaine on UDS).  . Obesity    Past Surgical History  Procedure Laterality Date  . Appendectomy    . Rotator cuff repair    . Tonsillectomy     Family History  Problem Relation Age of Onset  . Heart attack Father   . Diabetes Mother    History  Substance Use Topics  . Smoking status: Former Smoker -- 0.10 packs/day for 10 years    Quit date: 10/17/2007  . Smokeless tobacco: Current User    Types: Snuff     Comment: smokes one pack per month  . Alcohol Use: Yes     Comment: occasionally - 1 beer 1x/mont    Review of Systems ROS: Statement: All systems negative except as marked or noted in the HPI; Constitutional: Negative for fever and chills. +generalized fatigue.; ; Eyes: Negative for eye pain, redness and discharge. ; ; ENMT: Negative for ear pain, hoarseness, nasal congestion, sinus pressure and sore throat. ; ; Cardiovascular: Negative for chest pain, palpitations, diaphoresis, dyspnea and peripheral  edema. ; ; Respiratory: Negative for cough, wheezing and stridor. ; ; Gastrointestinal: Negative for nausea, vomiting, diarrhea, abdominal pain, blood in stool, hematemesis, jaundice and rectal bleeding. . ; ; Genitourinary: Negative for dysuria, flank pain and hematuria. ; ; Musculoskeletal: Negative for back pain and neck pain. Negative for swelling and trauma.; ; Skin: Negative for pruritus, rash, abrasions, blisters, bruising and skin lesion.; ; Neuro: Negative for headache, lightheadedness and neck stiffness. Negative for altered level of consciousness , altered mental status, extremity weakness, paresthesias, involuntary movement, seizure and syncope.      Allergies  Shrimp; Banana; Watermelon flavor; Almond oil; and Sulfa antibiotics  Home Medications   Current Outpatient Rx  Name  Route  Sig  Dispense  Refill  . atorvastatin (LIPITOR) 10 MG tablet   Oral   Take 10 mg by mouth daily.         Marland Kitchen diltiazem (CARDIZEM CD) 360 MG 24 hr capsule   Oral   Take 360 mg by mouth daily.         . flecainide (TAMBOCOR) 100 MG tablet   Oral   Take 1 tablet by mouth 2 (two) times daily.         . fluticasone (FLONASE) 50 MCG/ACT nasal spray   Nasal   Place 1 spray into the  nose daily.         Marland Kitchen. glipiZIDE (GLUCOTROL) 10 MG 24 hr tablet   Oral   Take 10 mg by mouth Twice daily.         Marland Kitchen. LANTUS SOLOSTAR 100 UNIT/ML injection   Subcutaneous   Inject 30 Units into the skin daily with breakfast.          . lisinopril-hydrochlorothiazide (PRINZIDE,ZESTORETIC) 10-12.5 MG per tablet   Oral   Take 1 tablet by mouth daily.         . metFORMIN (GLUCOPHAGE) 500 MG tablet   Oral   Take 1,000 mg by mouth 2 (two) times daily.          Carlena Hurl. XARELTO 20 MG TABS   Oral   Take 1 tablet by mouth daily.          BP 146/95  Pulse 88  Temp(Src) 101.1 F (38.4 C) (Oral)  Resp 15  Ht 5\' 9"  (1.753 m)  Wt 321 lb (145.605 kg)  BMI 47.38 kg/m2  SpO2 94% Physical Exam 1830: Physical  examination:  Nursing notes reviewed; Vital signs and O2 SAT reviewed;  Constitutional: Well developed, Well nourished, Well hydrated, In no acute distress; Head:  Normocephalic, atraumatic; Eyes: EOMI, PERRL, No scleral icterus; ENMT: TM's clear bilat. +edemetous nasal turbinates bilat with clear rhinorrhea. Mouth and pharynx without lesions. No tonsillar exudates. No intra-oral edema. No submandibular or sublingual edema. No hoarse voice, no drooling, no stridor. No pain with manipulation of larynx. Mouth and pharynx normal, Mucous membranes moist; Neck: Supple, no meningeal signs. Full range of motion, No lymphadenopathy; Cardiovascular: Regular rate and rhythm, No gallop; Respiratory: Breath sounds clear & equal bilaterally, No wheezes.  Speaking full sentences with ease, Normal respiratory effort/excursion; Chest: Nontender, Movement normal; Abdomen: Soft, large pannus. Nontender. Normal bowel sounds; Genitourinary: No CVA tenderness; Extremities: Pulses normal, No tenderness, No edema, No calf edema or asymmetry.; Neuro: AA&Ox3, Major CN grossly intact.  Speech clear. No gross focal motor or sensory deficits in extremities. Climbs on and off stretcher easily by himself. Gait steady.; Skin: Color normal, Warm, Dry.   ED Course  Procedures   EKG Interpretation    Date/Time:  Wednesday November 19 2013 18:08:39 EST Ventricular Rate:  90 PR Interval:  136 QRS Duration: 77 QT Interval:  370 QTC Calculation: 453 R Axis:   -11 Text Interpretation:  Sinus rhythm Left axis deviation Septal infarct , age undetermined Nonspecific T wave abnormality When compared with ECG of  05/18/2011 No significant change was found Confirmed by [facility]  MD, Vondell Babers [phone]) on 11/19/2013 7:21:52 PM           EKG unchanged from EKG also dated 01/16/13.    MDM  MDM Reviewed: previous chart, nursing note and vitals Reviewed previous: labs and ECG Interpretation: labs, ECG and x-ray     Results for orders  placed during the hospital encounter of 11/19/13  CBC WITH DIFFERENTIAL      Result Value Range   WBC 8.6  4.0 - 10.5 K/uL   RBC 4.58  4.22 - 5.81 MIL/uL   Hemoglobin 13.0  13.0 - 17.0 g/dL   HCT 96.036.8 (*) 45.439.0 - 09.852.0 %   MCV 80.3  78.0 - 100.0 fL   MCH 28.4  26.0 - 34.0 pg   MCHC 35.3  30.0 - 36.0 g/dL   RDW 11.912.9  14.711.5 - 82.915.5 %   Platelets 274  150 - 400 K/uL   Neutrophils Relative %  68  43 - 77 %   Neutro Abs 5.9  1.7 - 7.7 K/uL   Lymphocytes Relative 12  12 - 46 %   Lymphs Abs 1.0  0.7 - 4.0 K/uL   Monocytes Relative 17 (*) 3 - 12 %   Monocytes Absolute 1.5 (*) 0.1 - 1.0 K/uL   Eosinophils Relative 3  0 - 5 %   Eosinophils Absolute 0.2  0.0 - 0.7 K/uL   Basophils Relative 0  0 - 1 %   Basophils Absolute 0.0  0.0 - 0.1 K/uL  COMPREHENSIVE METABOLIC PANEL      Result Value Range   Sodium 130 (*) 137 - 147 mEq/L   Potassium 4.2  3.7 - 5.3 mEq/L   Chloride 92 (*) 96 - 112 mEq/L   CO2 24  19 - 32 mEq/L   Glucose, Bld 251 (*) 70 - 99 mg/dL   BUN 14  6 - 23 mg/dL   Creatinine, Ser 2.95  0.50 - 1.35 mg/dL   Calcium 8.6  8.4 - 62.1 mg/dL   Total Protein 7.7  6.0 - 8.3 g/dL   Albumin 3.0 (*) 3.5 - 5.2 g/dL   AST 28  0 - 37 U/L   ALT 28  0 - 53 U/L   Alkaline Phosphatase 135 (*) 39 - 117 U/L   Total Bilirubin 0.6  0.3 - 1.2 mg/dL   GFR calc non Af Amer >90  >90 mL/min   GFR calc Af Amer >90  >90 mL/min  URINALYSIS, ROUTINE W REFLEX MICROSCOPIC      Result Value Range   Color, Urine ORANGE (*) YELLOW   APPearance TURBID (*) CLEAR   Specific Gravity, Urine >1.030 (*) 1.005 - 1.030   pH 5.0  5.0 - 8.0   Glucose, UA NEGATIVE  NEGATIVE mg/dL   Hgb urine dipstick LARGE (*) NEGATIVE   Bilirubin Urine SMALL (*) NEGATIVE   Ketones, ur NEGATIVE  NEGATIVE mg/dL   Protein, ur 308 (*) NEGATIVE mg/dL   Urobilinogen, UA 1.0  0.0 - 1.0 mg/dL   Nitrite NEGATIVE  NEGATIVE   Leukocytes, UA NEGATIVE  NEGATIVE  GLUCOSE, CAPILLARY      Result Value Range   Glucose-Capillary 253 (*) 70 - 99  mg/dL  URINE MICROSCOPIC-ADD ON      Result Value Range   Squamous Epithelial / LPF RARE  RARE   WBC, UA 0-2  <3 WBC/hpf   RBC / HPF 3-6  <3 RBC/hpf   Bacteria, UA RARE  RARE   Casts HYALINE CASTS (*) NEGATIVE   Urine-Other AMORPHOUS URATES/PHOSPHATES     Dg Chest 2 View 11/19/2013   CLINICAL DATA:  Shortness of breath. Fatigue. Dehydration. Emesis.  EXAM: CHEST  2 VIEW  COMPARISON:  DG CHEST 2 VIEW dated 01/23/2013  FINDINGS: Stable mild enlargement of the cardiopericardial silhouette. No airspace opacity. Prior interstitial accentuation has resolved. No current edema or pleural effusion.  IMPRESSION: 1. Mild cardiomegaly. Otherwise, no significant abnormalities are observed.   Electronically Signed   By: Herbie Baltimore [name]   On: 11/19/2013 18:24    2000:  Hyperglycemic per hx DM, not acidotic with AG 14. Had fever while in ED, but no obvious source of infection; tx with APAP.  Pt denies any specific complaint other than "being tired" x1 week. Specifically denies sore throat, CP, SOB, cough, abd pain, N/V/D at any time.  EKG unchanged from several previous (has hx of PAF and is currently in NSR). CXR without  infiltrate or pulmonary edema. Pt has been walking around the ED with steady gait, easy resps, Sats 97% R/A, A&O. Pt has eaten a meal without N/V. No stooling while in the ED. Pt states he "wants to go home now." Will tx symptomatically at this time. Dx and testing d/w pt.  Questions answered.  Verb understanding, agreeable to d/c home with outpt f/u.    Laray Anger, DO 11/22/13 [phone]

## 2013-11-19 NOTE — ED Notes (Signed)
Pt undressed, in gown, placed on monitor to perform EKG and is now being taken out of the department for additional testing

## 2013-11-19 NOTE — ED Notes (Signed)
Pt not in dept at this time

## 2013-11-19 NOTE — ED Notes (Signed)
P 

## 2013-12-07 NOTE — Assessment & Plan Note (Signed)
Good compliance and control but with significant residual daytime somnolence associated with a separate diagnosis of narcolepsy

## 2013-12-07 NOTE — Assessment & Plan Note (Signed)
Disability has a 01/17/2013. Discussed sleep hygiene, responsibility to be alert driving, available meds.

## 2013-12-07 NOTE — Assessment & Plan Note (Signed)
He indicates he has remained abstinent

## 2013-12-11 ENCOUNTER — Ambulatory Visit: Payer: Self-pay | Admitting: Physician Assistant

## 2014-01-12 ENCOUNTER — Ambulatory Visit: Payer: Self-pay | Admitting: Physician Assistant

## 2014-02-11 ENCOUNTER — Telehealth: Payer: Self-pay | Admitting: Internal Medicine

## 2014-02-11 NOTE — Telephone Encounter (Signed)
Spoke with pt and he stated needs a letter to a lawyer who's name he did not have and will call back with. Pt denied for disability and reason applied for was Narcolepsy. [facility] "totally disabled due to his Narcolepsy" pt states lawyer this is what is needed for his appeal. Will route message to CY to see if letter is ok while awaiting pt to call back with name of lawyer.  Dr. Maple HudsonYoung please advised if you would be able to provide this letter for pt, thanks

## 2014-02-11 NOTE — Telephone Encounter (Signed)
Pt returned call

## 2014-02-11 NOTE — Telephone Encounter (Signed)
LMTCB

## 2014-02-12 ENCOUNTER — Ambulatory Visit: Payer: Self-pay | Admitting: Internal Medicine

## 2014-02-12 NOTE — Telephone Encounter (Signed)
Pt came by and states he went by lawyer's office to find out to to have letter sent to and receptionist told him to have letter made it to "[employer] It [employer]". Pt states to call him when letter is ready and he will come by to pick up.

## 2014-02-18 ENCOUNTER — Encounter: Payer: Self-pay | Admitting: Internal Medicine

## 2014-02-19 NOTE — Telephone Encounter (Signed)
Letter has been completed by CY and placed at front desk. Left message for patient to call back so we can make sure he is aware to come pick up letter.

## 2014-02-19 NOTE — Telephone Encounter (Signed)
[name] returned [name]'s call.  Advised [name] his letter has been completed & has been placed up front for him to p/u.  [name] verbalized understanding, states he will be p/u later today & nothing further needed at this time.  [name]

## 2014-03-06 ENCOUNTER — Ambulatory Visit: Payer: Self-pay | Admitting: Internal Medicine

## 2014-04-03 ENCOUNTER — Telehealth: Payer: Self-pay | Admitting: Internal Medicine

## 2014-04-03 DIAGNOSIS — G4733 Obstructive sleep apnea (adult) (pediatric): Secondary | ICD-10-CM

## 2014-04-06 NOTE — Telephone Encounter (Signed)
Pt is returning call & can be reached at [phone].  Douglas FairyHolly D Pryor

## 2014-04-06 NOTE — Telephone Encounter (Signed)
LMTCB

## 2014-04-06 NOTE — Telephone Encounter (Signed)
Spoke with the pt  [facility]  He will be switching from [facility]  I have sent order for this  Pt also requesting new rx for adderall  He stopped taking med a long time ago, but would like to restart med to be more compliant with disability claim  Please advise thanks! Last ov 11/14/13  Next ov 05/14/14

## 2014-04-08 MED ORDER — AMPHETAMINE-DEXTROAMPHET ER 20 MG PO CP24: 20.0000 mg | ORAL_CAPSULE | Freq: Every day | ORAL | Status: DC

## 2014-04-08 NOTE — Telephone Encounter (Signed)
Called spoke with patient, advised that CY okayed for him to resume the Adderall 20mg  1 daily #30.  Pt is aware that CY is not in the office this afternoon and is okay with a call back tomorrow once rx is ready to pick this up (does not wish for it to be mailed).  Rx printed for #30 tabs Adderall 20mg  XR and placed on CY's cart to be signed. +++++++++++++++++++ Rx signed by Ammie DaltonY Called spoke with patient, he will come today to pick this up Nothing further needed; will sign off

## 2014-04-08 NOTE — Telephone Encounter (Signed)
Ok to resume his Adderall 20 mg XR, # 30, 1 daily

## 2014-04-08 NOTE — Telephone Encounter (Signed)
Pt has not heard anything yet and would like to know the status. Please call back at [phone]

## 2014-04-08 NOTE — Telephone Encounter (Signed)
Please advise on adderall rx thanks

## 2014-05-07 ENCOUNTER — Telehealth: Payer: Self-pay | Admitting: Internal Medicine

## 2014-05-07 MED ORDER — AMPHETAMINE-DEXTROAMPHET ER 20 MG PO CP24: 20.0000 mg | ORAL_CAPSULE | Freq: Every day | ORAL | Status: DC

## 2014-05-07 NOTE — Telephone Encounter (Signed)
Pt last had refill 04/08/14 amphetamine-dextroamphetamine (ADDERALL XR) 20 MG 24 hr capsule #30 x 0 refills Last OV 11/14/13 Pending 05/14/14 Please advise Dr. Maple HudsonYoung thanks

## 2014-05-07 NOTE — Telephone Encounter (Signed)
Done

## 2014-05-07 NOTE — Telephone Encounter (Signed)
RX printed for CDY to sign ATC pt LMTCB x1 to advise it will be ready for pick up tomorrow

## 2014-05-07 NOTE — Telephone Encounter (Signed)
Ok to refill 

## 2014-05-08 NOTE — Telephone Encounter (Signed)
Pt advised. Aleister Lady, CMA  

## 2014-05-14 ENCOUNTER — Ambulatory Visit (INDEPENDENT_AMBULATORY_CARE_PROVIDER_SITE_OTHER): Payer: 59 | Admitting: Internal Medicine

## 2014-05-14 ENCOUNTER — Encounter: Payer: Self-pay | Admitting: Internal Medicine

## 2014-05-14 VITALS — BP 136/84 | HR 95 | Ht 69.0 in | Wt 312.0 lb

## 2014-05-14 DIAGNOSIS — G4733 Obstructive sleep apnea (adult) (pediatric): Secondary | ICD-10-CM

## 2014-05-14 DIAGNOSIS — G47411 Narcolepsy with cataplexy: Secondary | ICD-10-CM

## 2014-05-14 DIAGNOSIS — F17200 Nicotine dependence, unspecified, uncomplicated: Secondary | ICD-10-CM

## 2014-05-14 DIAGNOSIS — Z720 Tobacco use: Secondary | ICD-10-CM

## 2014-05-14 MED ORDER — IMIPRAMINE HCL 25 MG PO TABS: 25.0000 mg | ORAL_TABLET | Freq: Every day | ORAL | Status: DC

## 2014-05-14 MED ORDER — AMPHETAMINE-DEXTROAMPHET ER 30 MG PO CP24: ORAL_CAPSULE | ORAL | Status: DC

## 2014-05-14 NOTE — Patient Instructions (Addendum)
Script for Adderall 30 XR   2 daily as needed for sleepiness  Script for imipramine to help cataplexy 1 each night  Please call as needed

## 2014-05-14 NOTE — Progress Notes (Signed)
01/25/12- [age] yoM former smoker comes to reestablish for management of obstructive sleep apnea complicated by history of allergic rhinitis and atrial fibrillation, diabetes, HTN. PCP- Dr Trula Sladeon Polite NPSG 11/23/00- AHI 20 per hour. He continues using CPAP all night every night. 14 CWP/[facility]. Using a fullface mask but putting it on so only covers his nose,  otherwise it smothers him. Fighting daytime sleepiness which affects his work. Hypnagogic hallucination associated with dreaming. 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 Vivid dreams as soon as he falls asleep. Wakes irritable from naps. Bedtime 9:30 PM, very brief sleep latency, wakes 4 times for nocturia before up at 4:30 AM. No sleeping pill. 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  Quit smoking 2009, 1 beer/ month, little caffeine. Unmarried, living w/ sister, has girlfriend Fam hx- Father hx MI, Mother dcs'd DM  05/09/12- [age] yoM former smoker comes to reestablish for management of obstructive sleep apnea complicated by history of allergic rhinitis and atrial fibrillation, diabetes, HTN. PCP- Dr Trula Sladeon Polite He returns to followup and reports use of CPAP 13/ [facility] most nights, all night " a whole 'nother world". He sleeps soundly and now with CPAP he is sleeping through his morning alarm clock which has caused him to miss work. Bedtime is 9:30 PM and he is trying to get up at 5:15 AM. Still drowsy after meals and if sitting quietly. He also has an urge to sleep if he gets angry and at that time recognizes dreaming. He does not specifically describe muscle weakness with these episodes. He gives a possible history of sleep paralysis.  10/21/12- 41 yoM former smoker followed for management of obstructive sleep apnea/ hypersomnia complicated by history of allergic rhinitis and atrial fibrillation, diabetes, HTN. PCP- Dr Trula Sladeon [facility] FOLLOWS FOR: using CPAP/ 13/ [facility] every night for 5 hours per night. denies any  problems with the machine or the mask. He is still fighting daytime sleepiness and falling asleep at work. He struggles with this because he has to drive. Strong emotion makes him feel weak so that he needs to sit down. He recognizes sleep paralysis as an occasional event as well as hypnagogic hallucination. Caffeine has little daytime effect. 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 Incidental request for referral to an eye doctor because of his diabetes. He would like to go back to Dr. Luciana Axeankin who had seen him in the past. Rhinitis is improved by Flonase, asks refill.  01/14/13-41 yoM former smoker followed for management of obstructive sleep apnea/ hypersomnia complicated by history of allergic rhinitis and atrial fibrillation, diabetes, HTN. PCP- Dr Trula Sladeon [facility] FOLLOWS FOR: Wears CPAP 13/ [facility] every night for about 6-8 hours; pressure working well for patient.  He still can get drowsy if he sits quietly. Caffeine has no effect. Nuvigil was no help. Multiple Sleep Latency Test 10/30/2012-pathologic daytime hypersomnia, nonspecific, compatible with idiopathic hypersomnia or narcolepsy. Mean latency 0.9 minutes with one sleep onset REM event. He has been taken off of driving at work, he says because of an issue with his blood sugar being too high.  02/06/13- 41 yoM former smoker followed for management of obstructive sleep apnea and hypersomnia/ narcolepsy with cataplexy complicated by history of allergic rhinitis and atrial fibrillation, diabetes, HTN. PCP- Dr Trula Sladeon [facility] FOLLOWS FOR: wears CPAP every night for about 7 hours;pressure doing well. Recent hospital stay Hospital twice for AFib/ CHF. Also question of pneumonia. We discussed  potential for methylphenidate to precipitate atrial fibrillation which was a chronic diagnosis for him. He thinks it may be more anxious in the morning, not necessarily more alert. 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 He now describes waking with sleep paralysis and recognizes my description of hypnagogic hallucination. If startled he gets an uncomfortable weak aching feeling in the back of his neck, his knees get weak, sounds like cataplexy. I had not gotten is clear description before.  03/03/13- [age] yoM former smoker followed for management of obstructive sleep apnea and hypersomnia/ narcolepsy with cataplexy complicated by history of allergic rhinitis and atrial fibrillation, diabetes, HTN. PCP- Dr Trula Sladeon Polite 1 month follow up.  Wearing cpap 13/ [facility] everynight for approx 7 hours.  No problems with mask or pressure, he does follow some air. Still having daytime sleepimess.  Adderall helps him feel alert but lasts only 2 hours, and he still does. 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 Pressure reduced to 11 due to gas pains.   05/05/13- 41 yoM former smoker followed for management of obstructive sleep apnea and hypersomnia/ narcolepsy with cataplexy complicated by history of allergic rhinitis and atrial fibrillation, diabetes, HTN. PCP- Dr Trula Sladeon [facility] FOLLOWS FOR: pt reports wearing CPAP11/ [facility] machine everynight x 7 hrs per night -- pt reports pressure setting is comfortable no supplies needed-- denies any other concerns at this time Continues Dexedrine, one at 5 AM and one at lunch time. Any later in the day interferes with sleep at night. 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  11/14/13- 41 yoM former smoker followed for management of obstructive sleep apnea and hypersomnia/ narcolepsy with cataplexy complicated by history of allergic rhinitis and atrial fibrillation, diabetes, HTN. PCP- Dr Trula Sladeon [facility]11[facility] all night every night Working for his [employer] when he can.  Signed for total disability as of 01/17/2013. Sleep is about the same. He paces  himself during the day. Stopped Dexedrine. CXR 01/23/13 IMPRESSION:  Cardiomegaly prominent perihilar markings consistent with  bronchitis and possibly mild pulmonary vascular congestion.  Consider follow-up.  Original Report Authenticated By: Dwyane DeePaul Barry, [name]  05/14/14- 41 yoM former smoker followed for management of obstructive sleep apnea and hypersomnia/ narcolepsy with cataplexy complicated by history of allergic rhinitis and atrial fibrillation, diabetes, HTN. PCP- Dr Trula Sladeon [facility] FOLLOWS FOR: pt states he wishes to change from adderall to another medication.  Missed appt with [facility] last week to get a new cpap 11 (was [facility]- now ? [facility]), hasn't rescheduled yet .  Wears cpap "off and on" X few hours per night.   Preferred Dexedrine over Adderall, but too expensive.frequent cataplexy Now has lawyer helping him with disability application CXR 11/19/13 IMPRESSION:  1. Mild cardiomegaly. Otherwise, no significant abnormalities are  observed.  Electronically Signed  By: Herbie BaltimoreWalt Liebkemann [name]  On: 11/19/2013 18:24  ROS-see HPI Constitutional:   No-   weight loss, night sweats, fevers, chills,  +fatigue, lassitude. HEENT:   No-  headaches, difficulty swallowing, tooth/dental problems, sore throat,       No-  sneezing, itching, ear ache, nasal congestion, post nasal drip,  CV:  No-   chest pain, orthopnea, PND, swelling in lower extremities, anasarca, dizziness, palpitations Resp: No-   shortness of breath with exertion or at rest.  No-   productive cough,  No non-productive cough,  No- coughing up of blood.              No-   change in color of mucus.  No- wheezing.   Skin: No-   rash or lesions. GI:  No-   heartburn, indigestion, abdominal pain, nausea, vomiting,  GU:  MS:  No-   joint pain or swelling.  Neuro-     nothing unusual Psych:  No- change in mood or affect. No depression or anxiety.  No memory loss.  OBJ- Physical Exam General- Alert/ calm, Oriented,  Affect-appropriate, Distress- none acute, obese,  Skin- rash-none, lesions- none, excoriation- none Lymphadenopathy- none Head- atraumatic            Eyes- Gross vision intact, PERRLA, conjunctivae and secretions clear            Ears- Hearing, canals-normal            Nose- rhinitis/ turbinate edema, no-Septal dev, mucus, polyps, erosion, perforation             Throat- Mallampati III , mucosa clear , drainage- none, tonsils- atrophic Neck- flexible , trachea midline, no stridor , thyroid nl, carotid no bruit Chest - symmetrical excursion , unlabored           Heart/CV- RRR , no murmur , no gallop  , no rub, nl s1 s2                           - JVD- none , edema- none, stasis changes- none, varices- none           Lung- clear to P&A, wheeze- none, cough- none , dullness-none, rub- none           Chest wall-  Abd-  Br/ Gen/ Rectal- Not done, not indicated Extrem- cyanosis- none, clubbing, none, atrophy- none, strength- nl  Neuro- !! He had a distinct cataplexy episode apparently triggered by my knocking and entering the exam room. He was sitting in chair, eyes closed, mumbling and shaking for most of a minute. As he awoke there was no postictal state and there had been no gross motor jerking or incontinence.!!

## 2014-05-17 ENCOUNTER — Encounter: Payer: Self-pay | Admitting: Internal Medicine

## 2014-05-17 NOTE — Assessment & Plan Note (Signed)
Apparently he still smokes some Plan-smoking cessation emphasized

## 2014-05-17 NOTE — Assessment & Plan Note (Signed)
Multiple Sleep Latency Test 10/30/2012-pathologic daytime hypersomnia, nonspecific, compatible with idiopathic hypersomnia or narcolepsy. Mean latency 0.9 minutes with one sleep onset REM event. I think the episode witnessed today was probably a cataplectic attack, not a seizure Plan-try imipramine to suppress cataplexy. Increase Adderall to 30 mgXR up to twice daily.

## 2014-05-17 NOTE — Assessment & Plan Note (Signed)
We emphasized the importance of compliance with his CPAP to control at least of sleep apnea component of his sleep problems.

## 2014-06-08 ENCOUNTER — Telehealth: Payer: Self-pay | Admitting: Internal Medicine

## 2014-06-08 MED ORDER — AMPHETAMINE-DEXTROAMPHET ER 30 MG PO CP24: ORAL_CAPSULE | ORAL | Status: DC

## 2014-06-08 NOTE — Telephone Encounter (Signed)
Ready

## 2014-06-08 NOTE — Telephone Encounter (Signed)
Adderall XR 30 mg rx printed on 05/07/14 for # 30 take once daily no refills. Pt was seen by CY on 05/14/14 at which time adderall XR 30 mg # 60 take 2 tabs once daily no refill rx was printed. Pt states his insurance will not pay for the 2 daily adderall xr 30 mg.  He would like to continue with the once daily dosing.  He is aware he can still use the 2 a day on special need days where he has a couple saved from days he doesn't need to take any.  He verbalized understanding.   Dr. Maple Hudson, pt states it is time for a new adderall xr 30 mg take 1 daily rx.  He would like to pick this up once ready.  Rx printed.

## 2014-06-08 NOTE — Telephone Encounter (Signed)
Adderal XR 30 mg IS the once a day product. He will just have to use it once daily. If he skips an occasional day, he can save a couple to use 2 a day on special need days.

## 2014-06-08 NOTE — Telephone Encounter (Signed)
Called and spoke with pt and he stated that his insurance will not cover the 2 a day for the adderall 30 mg .  Pt is requesting to come by and pick up rx for once daily of the adderrall.  CY please advise. Thanks  Last ov--05/14/2014 Next ov--07/17/2014  Allergies  Allergen Reactions  . Shrimp [Shellfish Allergy] Anaphylaxis  . Banana Other (See Comments)    Pt gags  . Watermelon Flavor Nausea And Vomiting  . Almond Oil Itching    Roof of mouth itches  . Sulfa Antibiotics Itching    Current Outpatient Prescriptions on File Prior to Visit  Medication Sig Dispense Refill  . amphetamine-dextroamphetamine (ADDERALL XR) 30 MG 24 hr capsule 2 daily for narcolepsy  60 capsule  0  . atorvastatin (LIPITOR) 10 MG tablet Take 10 mg by mouth daily.      . flecainide (TAMBOCOR) 100 MG tablet Take 1 tablet by mouth 2 (two) times daily.      . fluticasone (FLONASE) 50 MCG/ACT nasal spray Place 1 spray into the nose daily.      Marland Kitchen glipiZIDE (GLUCOTROL) 10 MG 24 hr tablet Take 10 mg by mouth Twice daily.      Marland Kitchen imipramine (TOFRANIL) 25 MG tablet Take 1 tablet (25 mg total) by mouth at bedtime.  30 tablet  3  . LANTUS SOLOSTAR 100 UNIT/ML injection Inject 30 Units into the skin daily with breakfast.       . lisinopril-hydrochlorothiazide (PRINZIDE,ZESTORETIC) 10-12.5 MG per tablet Take 1 tablet by mouth daily.      . metFORMIN (GLUCOPHAGE) 500 MG tablet Take 1,000 mg by mouth 2 (two) times daily.       [name] 20 MG TABS Take 1 tablet by mouth daily.       No current facility-administered medications on file prior to visit.

## 2014-06-09 NOTE — Telephone Encounter (Signed)
[name], msg was routed to my box.  Do you recall if this was placed at the front for pick up yesterday and if pt was notified.

## 2014-06-09 NOTE — Telephone Encounter (Signed)
Unsure why this was routed to you; this is the patient we both ended up calling once Rx was ready for pick up. Pt was made aware yesterday and nothing more needed at this time.

## 2014-06-22 ENCOUNTER — Emergency Department (HOSPITAL_COMMUNITY)
Admission: EM | Admit: 2014-06-22 | Discharge: 2014-06-22 | Disposition: A | Discharge status: Home / Self Care | Payer: 59 | Attending: Emergency Medicine | Admitting: Emergency Medicine

## 2014-06-22 ENCOUNTER — Encounter (HOSPITAL_COMMUNITY): Payer: Self-pay | Admitting: Emergency Medicine

## 2014-06-22 ENCOUNTER — Emergency Department (HOSPITAL_COMMUNITY): Payer: 59

## 2014-06-22 DIAGNOSIS — R1013 Epigastric pain: Secondary | ICD-10-CM | POA: Insufficient documentation

## 2014-06-22 DIAGNOSIS — R748 Abnormal levels of other serum enzymes: Secondary | ICD-10-CM | POA: Diagnosis not present

## 2014-06-22 DIAGNOSIS — Z7902 Long term (current) use of antithrombotics/antiplatelets: Secondary | ICD-10-CM | POA: Diagnosis not present

## 2014-06-22 DIAGNOSIS — Z79899 Other long term (current) drug therapy: Secondary | ICD-10-CM | POA: Diagnosis not present

## 2014-06-22 DIAGNOSIS — E119 Type 2 diabetes mellitus without complications: Secondary | ICD-10-CM | POA: Diagnosis not present

## 2014-06-22 DIAGNOSIS — I4891 Unspecified atrial fibrillation: Secondary | ICD-10-CM | POA: Insufficient documentation

## 2014-06-22 DIAGNOSIS — I10 Essential (primary) hypertension: Secondary | ICD-10-CM | POA: Diagnosis not present

## 2014-06-22 DIAGNOSIS — Z87891 Personal history of nicotine dependence: Secondary | ICD-10-CM | POA: Insufficient documentation

## 2014-06-22 LAB — CBC WITH DIFFERENTIAL/PLATELET
BASOS PCT: 0 % (ref 0–1)
Basophils Absolute: 0 10*3/uL (ref 0.0–0.1)
Eosinophils Absolute: 0.5 10*3/uL (ref 0.0–0.7)
Eosinophils Relative: 7 % — ABNORMAL HIGH (ref 0–5)
HCT: 39 % (ref 39.0–52.0)
HEMOGLOBIN: 13.8 g/dL (ref 13.0–17.0)
Lymphocytes Relative: 30 % (ref 12–46)
Lymphs Abs: 2.5 10*3/uL (ref 0.7–4.0)
MCH: 29.2 pg (ref 26.0–34.0)
MCHC: 35.4 g/dL (ref 30.0–36.0)
MCV: 82.5 fL (ref 78.0–100.0)
MONOS PCT: 7 % (ref 3–12)
Monocytes Absolute: 0.6 10*3/uL (ref 0.1–1.0)
NEUTROS PCT: 56 % (ref 43–77)
Neutro Abs: 4.7 10*3/uL (ref 1.7–7.7)
Platelets: 273 10*3/uL (ref 150–400)
RBC: 4.73 MIL/uL (ref 4.22–5.81)
RDW: 13.6 % (ref 11.5–15.5)
WBC: 8.3 10*3/uL (ref 4.0–10.5)

## 2014-06-22 LAB — COMPREHENSIVE METABOLIC PANEL
ALBUMIN: 3.5 g/dL (ref 3.5–5.2)
ALK PHOS: 73 U/L (ref 39–117)
ALT: 34 U/L (ref 0–53)
AST: 26 U/L (ref 0–37)
Anion gap: 12 (ref 5–15)
BUN: 16 mg/dL (ref 6–23)
CO2: 25 mEq/L (ref 19–32)
Calcium: 8.9 mg/dL (ref 8.4–10.5)
Chloride: 100 mEq/L (ref 96–112)
Creatinine, Ser: 0.94 mg/dL (ref 0.50–1.35)
GFR calc Af Amer: >90 mL/min (ref >90)
GFR calc non Af Amer: >90 mL/min (ref >90)
GLUCOSE: 152 mg/dL — AB (ref 70–99)
POTASSIUM: 4.3 meq/L (ref 3.7–5.3)
SODIUM: 137 meq/L (ref 137–147)
Total Protein: 7.3 g/dL (ref 6.0–8.3)

## 2014-06-22 LAB — LIPASE, BLOOD: Lipase: 97 U/L — ABNORMAL HIGH (ref 11–59)

## 2014-06-22 IMAGING — CR DG ABDOMEN ACUTE W/ 1V CHEST
4 series · 4 of 4 positions shown · non-contrast
Comparison: Chest [DATE]

CLINICAL DATA: Gas pains for 1 week.

EXAM:
ACUTE ABDOMEN SERIES (ABDOMEN 2 VIEW & CHEST 1 VIEW)

[w chest pa]
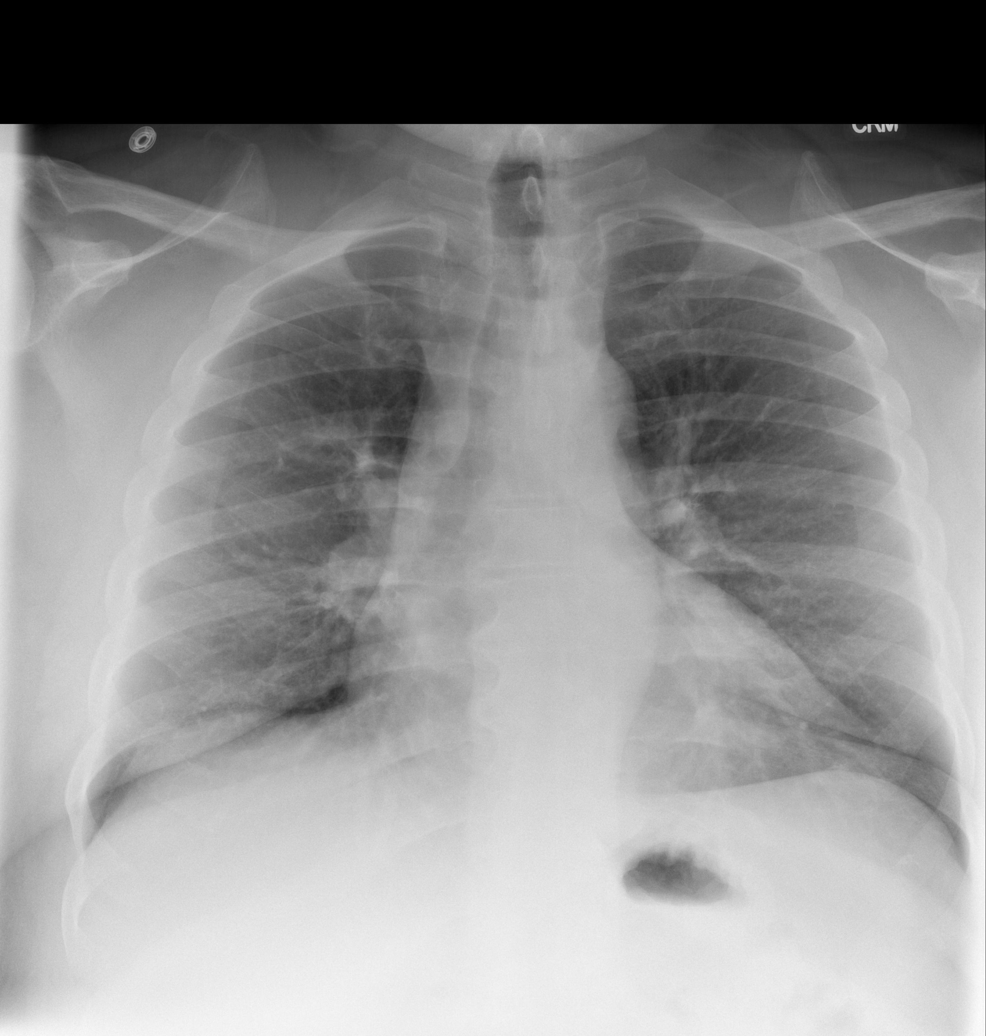

[w abdomen upright]
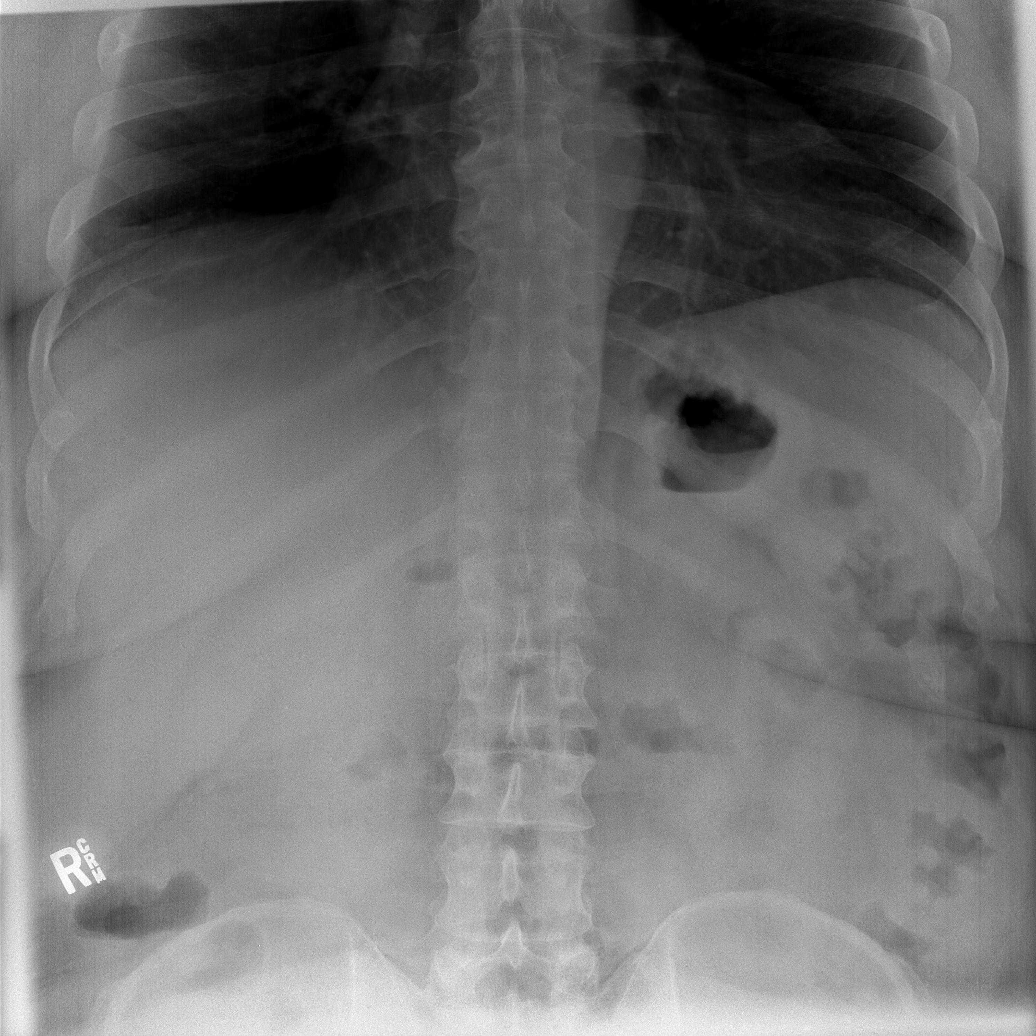

[t abdomen supine (1 of 2)]
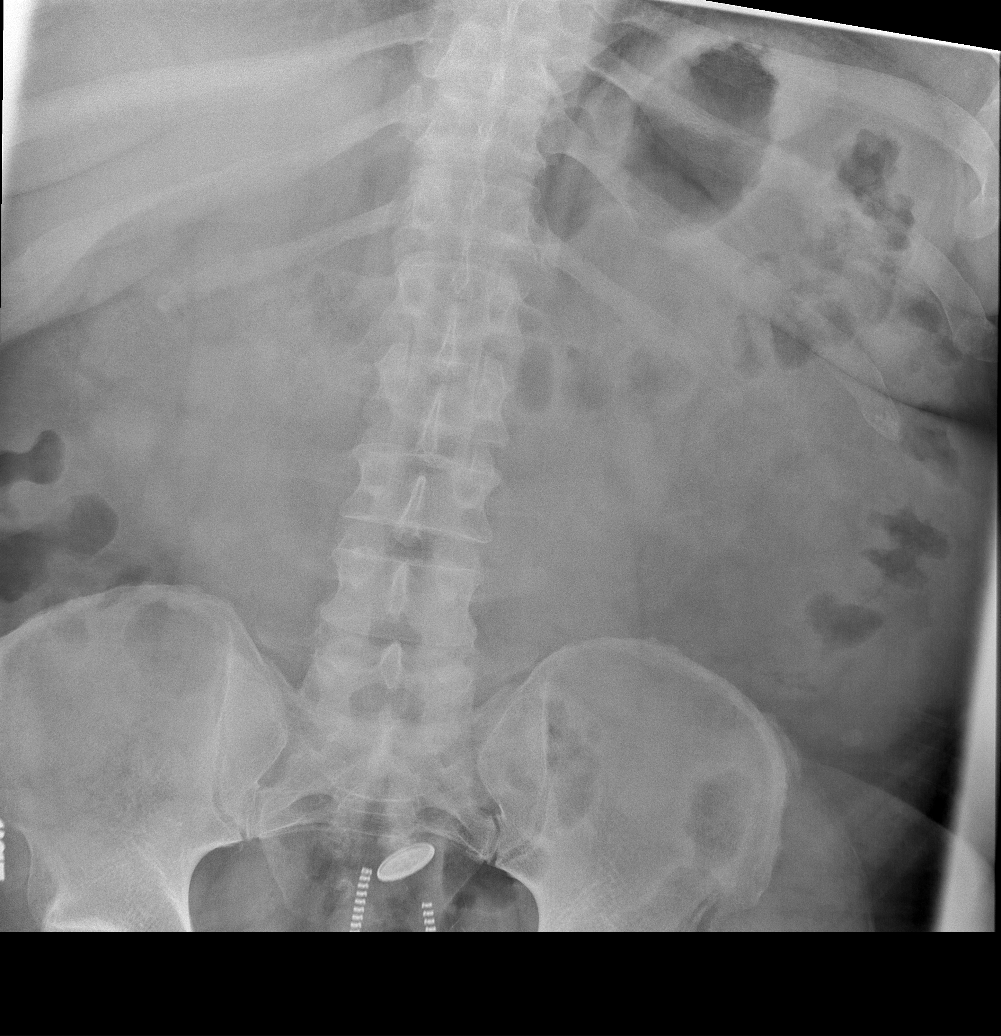

[t abdomen supine (2 of 2)]
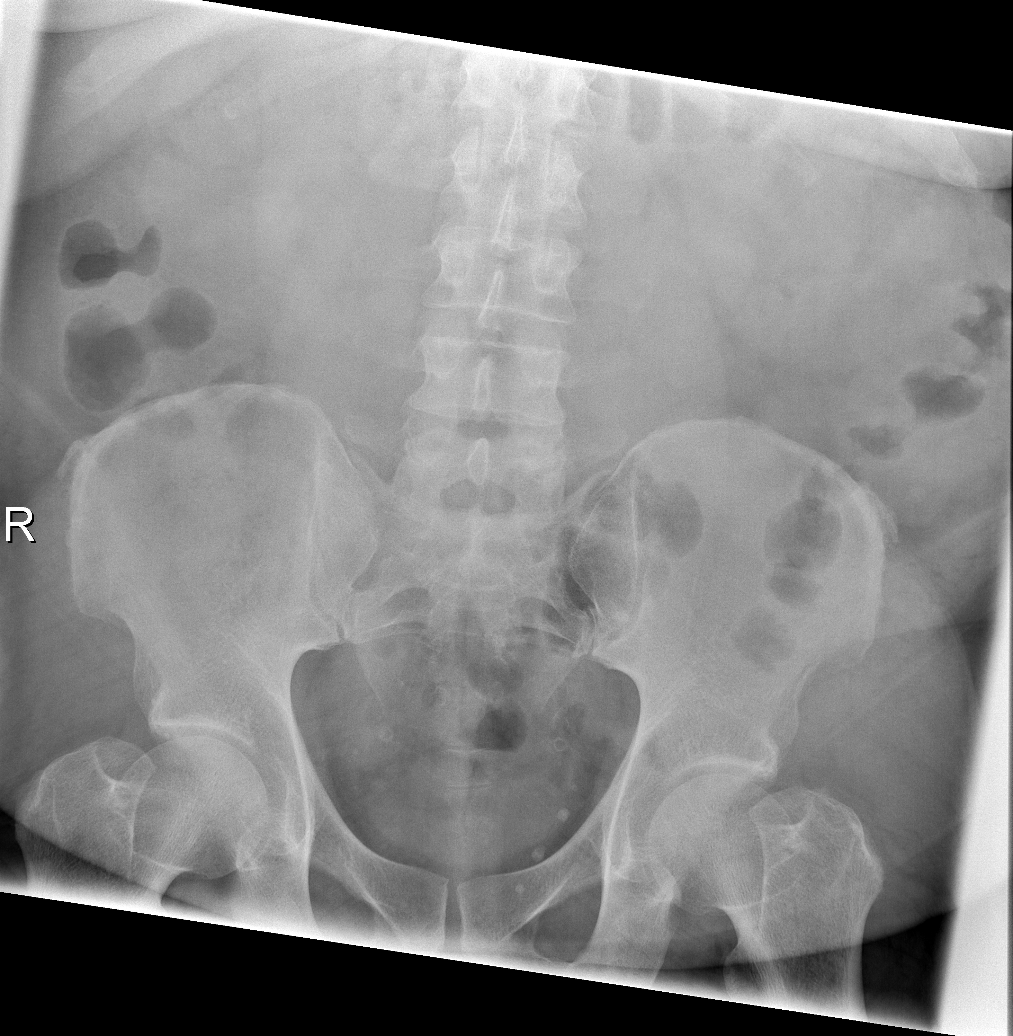

[4 of 4 positions shown; findings below may reference images not displayed]

FINDINGS: There is no evidence of dilated bowel loops or free intraperitoneal
air. No radiopaque calculi or other significant radiographic
abnormality is seen. Heart size and mediastinal contours are within
normal limits. Both lungs are clear.
IMPRESSION: Negative abdominal radiographs.  No acute cardiopulmonary disease.

## 2014-06-22 MED ORDER — PANTOPRAZOLE SODIUM 40 MG PO TBEC
40.0000 mg | DELAYED_RELEASE_TABLET | Freq: Once | ORAL | Status: AC
  Administered 2014-06-22: 40 mg via ORAL
  Filled 2014-06-22: qty 1

## 2014-06-22 MED ORDER — GI COCKTAIL ~~LOC~~
30.0000 mL | Freq: Once | ORAL | Status: AC
  Administered 2014-06-22: 30 mL via ORAL
  Filled 2014-06-22: qty 30

## 2014-06-22 MED ORDER — PANTOPRAZOLE SODIUM 40 MG PO TBEC: 40.0000 mg | DELAYED_RELEASE_TABLET | Freq: Every day | ORAL | Status: DC

## 2014-06-22 NOTE — ED Provider Notes (Signed)
CSN: [id number]     Arrival date & time 06/22/14  4098 History   First MD Initiated Contact with Patient 06/22/14 0424     Chief Complaint  Patient presents with  . Abdominal Pain     (Consider location/radiation/quality/duration/timing/severity/associated sxs/prior Treatment) Patient is a 43 y.o. male presenting with abdominal pain. The history is provided by the patient.  Abdominal Pain He has noted crampy abdominal pain for the last week which is getting worse. He states the pain is around the perimeter of his abdomen and seems to be worse when he lays down. He doesn't notice it much during the daytime. There is no associated nausea or vomiting. He thought he may have been constipated and took a laxative without relief. His appetite has been normal. Pain is not affected by eating. He notices nothing makes the pain better, but it does seem worse when he lays flat.  Past Medical History  Diagnosis Date  . Hypertension   . Diabetes mellitus   . Sleep apnea   . Morbid obesity   . History of cocaine use   . History of alcohol abuse   . Paroxysmal atrial fibrillation     Occurring in 2008, with several recurrence since then (including in the setting of + cocaine on UDS).  . Obesity    Past Surgical History  Procedure Laterality Date  . Appendectomy    . Rotator cuff repair    . Tonsillectomy     Family History  Problem Relation Age of Onset  . Heart attack Father   . Diabetes Mother    History  Substance Use Topics  . Smoking status: Former Smoker -- 0.10 packs/day for 10 years    Quit date: 10/17/2007  . Smokeless tobacco: Current User    Types: Snuff     Comment: smokes one pack per month  . Alcohol Use: Yes     Comment: occasionally - 1 beer 1x/mont    Review of Systems  Gastrointestinal: Positive for abdominal pain.  All other systems reviewed and are negative.     Allergies  Shrimp; Banana; Watermelon flavor; Almond oil; and Sulfa antibiotics  Home  Medications   Prior to Admission medications   Medication Sig Start Date End Date Taking? Authorizing Provider  amphetamine-dextroamphetamine (ADDERALL XR) 30 MG 24 hr capsule Take 30 mg by mouth daily as needed (narcolepsy).   Yes Historical Provider, MD  atorvastatin (LIPITOR) 10 MG tablet Take 10 mg by mouth daily.   Yes Historical Provider, MD  flecainide (TAMBOCOR) 100 MG tablet Take 1 tablet by mouth 2 (two) times daily. 01/22/13  Yes Historical Provider, MD  glipiZIDE (GLUCOTROL) 10 MG 24 hr tablet Take 10 mg by mouth Twice daily. 02/28/11  Yes Historical Provider, MD  imipramine (TOFRANIL) 25 MG tablet Take 1 tablet (25 mg total) by mouth at bedtime. 05/14/14  Yes Waymon Budge, MD  LANTUS SOLOSTAR 100 UNIT/ML injection Inject 30 Units into the skin daily with breakfast.  04/22/12  Yes Historical Provider, MD  lisinopril-hydrochlorothiazide (PRINZIDE,ZESTORETIC) 10-12.5 MG per tablet Take 1 tablet by mouth daily.   Yes Historical Provider, MD  metFORMIN (GLUCOPHAGE) 500 MG tablet Take 1,000 mg by mouth 2 (two) times daily.    Yes Historical Provider, MD  XARELTO 20 MG TABS Take 1 tablet by mouth daily. 01/22/13  Yes Historical Provider, MD   BP 147/88  Temp(Src) 97.6 F (36.4 C) (Oral)  Resp 21  SpO2 99% Physical Exam  Nursing note and vitals  reviewed.  Morbidly obese 43 year old male, resting comfortably and in no acute distress. Vital signs are significant for mild hypertension and borderline tachypnea. Oxygen saturation is 99%, which is normal. Head is normocephalic and atraumatic. PERRLA, EOMI. Oropharynx is clear. Neck is nontender and supple without adenopathy or JVD. Back is nontender and there is no CVA tenderness. Lungs are clear without rales, wheezes, or rhonchi. Chest is nontender. Heart has regular rate and rhythm without murmur. Abdomen is obese, soft, flat, with mild to moderate epigastric tenderness. There is no rebound or guarding. There are no masses or  hepatosplenomegaly and peristalsis is normoactive. Extremities have no cyanosis or edema, full range of motion is present. Skin is warm and dry without rash. Neurologic: Mental status is normal, cranial nerves are intact, there are no motor or sensory deficits.  ED Course  Procedures (including critical care time) Labs Review Results for orders placed during the hospital encounter of 06/22/14  CBC WITH DIFFERENTIAL      Result Value Ref Range   WBC 8.3  4.0 - 10.5 K/uL   RBC 4.73  4.22 - 5.81 MIL/uL   Hemoglobin 13.8  13.0 - 17.0 g/dL   HCT 52.8  41.3 - 24.4 %   MCV 82.5  78.0 - 100.0 fL   MCH 29.2  26.0 - 34.0 pg   MCHC 35.4  30.0 - 36.0 g/dL   RDW 01.0  27.2 - 53.6 %   Platelets 273  150 - 400 K/uL   Neutrophils Relative % 56  43 - 77 %   Neutro Abs 4.7  1.7 - 7.7 K/uL   Lymphocytes Relative 30  12 - 46 %   Lymphs Abs 2.5  0.7 - 4.0 K/uL   Monocytes Relative 7  3 - 12 %   Monocytes Absolute 0.6  0.1 - 1.0 K/uL   Eosinophils Relative 7 (*) 0 - 5 %   Eosinophils Absolute 0.5  0.0 - 0.7 K/uL   Basophils Relative 0  0 - 1 %   Basophils Absolute 0.0  0.0 - 0.1 K/uL  COMPREHENSIVE METABOLIC PANEL      Result Value Ref Range   Sodium 137  137 - 147 mEq/L   Potassium 4.3  3.7 - 5.3 mEq/L   Chloride 100  96 - 112 mEq/L   CO2 25  19 - 32 mEq/L   Glucose, Bld 152 (*) 70 - 99 mg/dL   BUN 16  6 - 23 mg/dL   Creatinine, Ser 6.44  0.50 - 1.35 mg/dL   Calcium 8.9  8.4 - 03.4 mg/dL   Total Protein 7.3  6.0 - 8.3 g/dL   Albumin 3.5  3.5 - 5.2 g/dL   AST 26  0 - 37 U/L   ALT 34  0 - 53 U/L   Alkaline Phosphatase 73  39 - 117 U/L   Total Bilirubin <0.2 (*) 0.3 - 1.2 mg/dL   GFR calc non Af Amer >90  >90 mL/min   GFR calc Af Amer >90  >90 mL/min   Anion gap 12  5 - 15  LIPASE, BLOOD      Result Value Ref Range   Lipase 97 (*) 11 - 59 U/L   Imaging Review Dg Abd Acute W/chest  06/22/2014   CLINICAL DATA:  Gas pains for 1 week.  EXAM: ACUTE ABDOMEN SERIES (ABDOMEN 2 VIEW & CHEST 1  VIEW)  COMPARISON:  Chest 11/19/2013  FINDINGS: There is no evidence of dilated bowel  loops or free intraperitoneal air. No radiopaque calculi or other significant radiographic abnormality is seen. Heart size and mediastinal contours are within normal limits. Both lungs are clear.  IMPRESSION: Negative abdominal radiographs.  No acute cardiopulmonary disease.   Electronically Signed   By: Burman Nieves [name]   On: 06/22/2014 05:17   MDM   Final diagnoses:  Epigastric pain  Elevated lipase  OBESITY, MORBID    Epigastric tenderness concerning for possible GERD, peptic ulcer disease, gastritis. Patient states he thinks his pain is due to gas abdominal x-rays will be obtained as well screening labs and will be given a therapeutic trial of pantoprazole and a GI cocktail. Lipids are reviewed and he has not had prior ED visits with similar complaints  He feels much the above noted treatment. His lipase has come back moderately elevated at 97 but he does not clinically have pancreatitis. I have explained this finding to the patient presents to return should he have worsening pain.  Since he got excellent relief of pain with GI cocktail) Tapazole, he will be discharged with a prescription for pantoprazole. Follow up with his PCP later this week.  Dione Booze, MD 06/22/14 [phone]

## 2014-06-22 NOTE — ED Notes (Signed)
Pt reports that he has been having gas pains x 1 weeks. Pt denies N/V/D. NAD at this time. Pt reports hx of diabetes.

## 2014-06-22 NOTE — Discharge Instructions (Signed)
Gastroesophageal Reflux Disease, Adult °Gastroesophageal reflux disease (GERD) happens when acid from your stomach flows up into the esophagus. When acid comes in contact with the esophagus, the acid causes soreness (inflammation) in the esophagus. Over time, GERD may create small holes (ulcers) in the lining of the esophagus. °CAUSES  °· Increased body weight. This puts pressure on the stomach, making acid rise from the stomach into the esophagus. °· Smoking. This increases acid production in the stomach. °· Drinking alcohol. This causes decreased pressure in the lower esophageal sphincter (valve or ring of muscle between the esophagus and stomach), allowing acid from the stomach into the esophagus. °· Late evening meals and a full stomach. This increases pressure and acid production in the stomach. °· A malformed lower esophageal sphincter. °Sometimes, no cause is found. °SYMPTOMS  °· Burning pain in the lower part of the mid-chest behind the breastbone and in the mid-stomach area. This may occur twice a week or more often. °· Trouble swallowing. °· Sore throat. °· Dry cough. °· Asthma-like symptoms including chest tightness, shortness of breath, or wheezing. °DIAGNOSIS  °Your caregiver may be able to diagnose GERD based on your symptoms. In some cases, X-rays and other tests may be done to check for complications or to check the condition of your stomach and esophagus. °TREATMENT  °Your caregiver may recommend over-the-counter or prescription medicines to help decrease acid production. Ask your caregiver before starting or adding any new medicines.  °HOME CARE INSTRUCTIONS  °· Change the factors that you can control. Ask your caregiver for guidance concerning weight loss, quitting smoking, and alcohol consumption. °· Avoid foods and drinks that make your symptoms worse, such as: °¨ Caffeine or alcoholic drinks. °¨ Chocolate. °¨ Peppermint or mint flavorings. °¨ Garlic and onions. °¨ Spicy foods. °¨ Citrus fruits,  such as oranges, lemons, or limes. °¨ Tomato-based foods such as sauce, chili, salsa, and pizza. °¨ Fried and fatty foods. °· Avoid lying down for the 3 hours prior to your bedtime or prior to taking a nap. °· Eat small, frequent meals instead of large meals. °· Wear loose-fitting clothing. Do not wear anything tight around your waist that causes pressure on your stomach. °· Raise the head of your bed 6 to 8 inches with wood blocks to help you sleep. Extra pillows will not help. °· Only take over-the-counter or prescription medicines for pain, discomfort, or fever as directed by your caregiver. °· Do not take aspirin, ibuprofen, or other nonsteroidal anti-inflammatory drugs (NSAIDs). °SEEK IMMEDIATE MEDICAL CARE IF:  °· You have pain in your arms, neck, jaw, teeth, or back. °· Your pain increases or changes in intensity or duration. °· You develop nausea, vomiting, or sweating (diaphoresis). °· You develop shortness of breath, or you faint. °· Your vomit is green, yellow, black, or looks like coffee grounds or blood. °· Your stool is red, bloody, or black. °These symptoms could be signs of other problems, such as heart disease, gastric bleeding, or esophageal bleeding. °MAKE SURE YOU:  °· Understand these instructions. °· Will watch your condition. °· Will get help right away if you are not doing well or get worse. °Document Released: 07/12/2005 Document Revised: 12/25/2011 Document Reviewed: 04/21/2011 °[employer]® Patient Information ©2015 [employer]. This information is not intended to replace advice given to you by your health care provider. Make sure you discuss any questions you have with your health care provider. ° °Pantoprazole tablets °What is this medicine? °PANTOPRAZOLE (pan TOE pra zole) prevents the production of   acid in the stomach. It is used to treat gastroesophageal reflux disease (GERD), inflammation of the esophagus, and Zollinger-Ellison syndrome. °This medicine may be used for other purposes;  ask your health care provider or pharmacist if you have questions. °COMMON BRAND NAME(S): Protonix °What should I tell my health care provider before I take this medicine? °They need to know if you have any of these conditions: °-liver disease °-low levels of magnesium in the blood °-an unusual or allergic reaction to omeprazole, lansoprazole, pantoprazole, rabeprazole, other medicines, foods, dyes, or preservatives °-pregnant or trying to get pregnant °-breast-feeding °How should I use this medicine? °Take this medicine by mouth. Swallow the tablets whole with a drink of water. Follow the directions on the prescription label. Do not crush, break, or chew. Take your medicine at regular intervals. Do not take your medicine more often than directed. °Talk to your pediatrician regarding the use of this medicine in children. While this drug may be prescribed for children as young as 5 years for selected conditions, precautions do apply. °Overdosage: If you think you have taken too much of this medicine contact a poison control center or emergency room at once. °NOTE: This medicine is only for you. Do not share this medicine with others. °What if I miss a dose? °If you miss a dose, take it as soon as you can. If it is almost time for your next dose, take only that dose. Do not take double or extra doses. °What may interact with this medicine? °Do not take this medicine with any of the following medications: °-atazanavir °-nelfinavir °This medicine may also interact with the following medications: °-ampicillin °-delavirdine °-digoxin °-diuretics °-iron salts °-medicines for fungal infections like ketoconazole, itraconazole and voriconazole °-warfarin °This list may not describe all possible interactions. Give your health care provider a list of all the medicines, herbs, non-prescription drugs, or dietary supplements you use. Also tell them if you smoke, drink alcohol, or use illegal drugs. Some items may interact with  your medicine. °What should I watch for while using this medicine? °It can take several days before your stomach pain gets better. Check with your doctor or health care professional if your condition does not start to get better, or if it gets worse. °You may need blood work done while you are taking this medicine. °What side effects may I notice from receiving this medicine? °Side effects that you should report to your doctor or health care professional as soon as possible: °-allergic reactions like skin rash, itching or hives, swelling of the face, lips, or tongue °-bone, muscle or joint pain °-breathing problems °-chest pain or chest tightness °-dark yellow or brown urine °-dizziness °-fast, irregular heartbeat °-feeling faint or lightheaded °-fever or sore throat °-muscle spasm °-palpitations °-redness, blistering, peeling or loosening of the skin, including inside the mouth °-seizures °-tremors °-unusual bleeding or bruising °-unusually weak or tired °-yellowing of the eyes or skin °Side effects that usually do not require medical attention (Report these to your doctor or health care professional if they continue or are bothersome.): °-constipation °-diarrhea °-dry mouth °-headache °-nausea °This list may not describe all possible side effects. Call your doctor for medical advice about side effects. You may report side effects to FDA at 1-800-FDA-1088. °Where should I keep my medicine? °Keep out of the reach of children. °Store at room temperature between 15 and 30 degrees C (59 and 86 degrees F). Protect from light and moisture. Throw away any unused medicine after the expiration date. °NOTE:   This sheet is a summary. It may not cover all possible information. If you have questions about this medicine, talk to your doctor, pharmacist, or health care provider. °© 2015, [employer]. (2012-07-31 16:40:16) ° °

## 2014-06-24 ENCOUNTER — Emergency Department (HOSPITAL_COMMUNITY)
Admission: EM | Admit: 2014-06-24 | Discharge: 2014-06-24 | Disposition: A | Discharge status: Home / Self Care | Payer: 59 | Attending: Emergency Medicine | Admitting: Emergency Medicine

## 2014-06-24 ENCOUNTER — Encounter (HOSPITAL_COMMUNITY): Payer: Self-pay | Admitting: Emergency Medicine

## 2014-06-24 DIAGNOSIS — R110 Nausea: Secondary | ICD-10-CM | POA: Diagnosis present

## 2014-06-24 DIAGNOSIS — E119 Type 2 diabetes mellitus without complications: Secondary | ICD-10-CM | POA: Diagnosis not present

## 2014-06-24 DIAGNOSIS — K219 Gastro-esophageal reflux disease without esophagitis: Secondary | ICD-10-CM | POA: Diagnosis not present

## 2014-06-24 DIAGNOSIS — R630 Anorexia: Secondary | ICD-10-CM | POA: Insufficient documentation

## 2014-06-24 DIAGNOSIS — Z794 Long term (current) use of insulin: Secondary | ICD-10-CM | POA: Insufficient documentation

## 2014-06-24 DIAGNOSIS — Z79899 Other long term (current) drug therapy: Secondary | ICD-10-CM | POA: Diagnosis not present

## 2014-06-24 DIAGNOSIS — Z7901 Long term (current) use of anticoagulants: Secondary | ICD-10-CM | POA: Insufficient documentation

## 2014-06-24 DIAGNOSIS — Z87891 Personal history of nicotine dependence: Secondary | ICD-10-CM | POA: Insufficient documentation

## 2014-06-24 DIAGNOSIS — R1013 Epigastric pain: Secondary | ICD-10-CM | POA: Diagnosis not present

## 2014-06-24 DIAGNOSIS — Z9089 Acquired absence of other organs: Secondary | ICD-10-CM | POA: Diagnosis not present

## 2014-06-24 DIAGNOSIS — I4891 Unspecified atrial fibrillation: Secondary | ICD-10-CM | POA: Insufficient documentation

## 2014-06-24 DIAGNOSIS — I10 Essential (primary) hypertension: Secondary | ICD-10-CM

## 2014-06-24 DIAGNOSIS — I509 Heart failure, unspecified: Secondary | ICD-10-CM | POA: Insufficient documentation

## 2014-06-24 LAB — CBG MONITORING, ED: GLUCOSE-CAPILLARY: 218 mg/dL — AB (ref 70–99)

## 2014-06-24 MED ORDER — FAMOTIDINE 20 MG PO TABS
20.0000 mg | ORAL_TABLET | Freq: Once | ORAL | Status: AC
  Administered 2014-06-24: 20 mg via ORAL
  Filled 2014-06-24: qty 1

## 2014-06-24 MED ORDER — CLONIDINE HCL 0.1 MG PO TABS
0.1000 mg | ORAL_TABLET | Freq: Once | ORAL | Status: AC
  Administered 2014-06-24: 0.1 mg via ORAL
  Filled 2014-06-24: qty 1

## 2014-06-24 MED ORDER — ONDANSETRON 4 MG PO TBDP: ORAL_TABLET | ORAL | Status: DC

## 2014-06-24 NOTE — ED Notes (Signed)
Patient states went to the hospital on Monday and was diagnosis with Reflux. Patient states we was given medication, medication worked for one day but pain has now increased. Patient states he has dry heaves. Patient states the abd is "sore". Patient states he took the medication this morning for reflux however unable to remember the name. Patient states his last BM was this morning and it was solid.

## 2014-06-24 NOTE — ED Provider Notes (Signed)
CSN: [id number]     Arrival date & time 06/24/14  1028 History   First MD Initiated Contact with Patient 06/24/14 1207     Chief Complaint  Patient presents with  . Gastrophageal Reflux  . Nausea     (Consider location/radiation/quality/duration/timing/severity/associated sxs/prior Treatment) HPI Comments: Patient with diabetes, obesity, atrial fibrillation, congestive heart failure presents with intermittent epigastric pain worse with eating and nausea for the past 3 days. Patient recently seen and had blood work done which is unremarkable. Patient barely is no pain and only nausea. No chest pain or shortness of breath. No known gallbladder problems. Patient just started taking acid medications with mild improvement.  Patient is a 43 y.o. male presenting with GERD. The history is provided by the patient.  Gastrophageal Reflux Associated symptoms include abdominal pain. Pertinent negatives include no chest pain, no headaches and no shortness of breath.    Past Medical History  Diagnosis Date  . Hypertension   . Diabetes mellitus   . Sleep apnea   . Morbid obesity   . History of cocaine use   . History of alcohol abuse   . Paroxysmal atrial fibrillation     Occurring in 2008, with several recurrence since then (including in the setting of + cocaine on UDS).  . Obesity    Past Surgical History  Procedure Laterality Date  . Appendectomy    . Rotator cuff repair    . Tonsillectomy     Family History  Problem Relation Age of Onset  . Heart attack Father   . Diabetes Mother    History  Substance Use Topics  . Smoking status: Former Smoker -- 0.10 packs/day for 10 years    Quit date: 10/17/2007  . Smokeless tobacco: Current User    Types: Snuff     Comment: smokes one pack per month  . Alcohol Use: Yes     Comment: occasionally - 1 beer 1x/mont    Review of Systems  Constitutional: Positive for appetite change. Negative for fever and chills.  HENT: Negative for  congestion.   Eyes: Negative for visual disturbance.  Respiratory: Negative for shortness of breath.   Cardiovascular: Negative for chest pain.  Gastrointestinal: Positive for nausea and abdominal pain. Negative for vomiting.  Genitourinary: Negative for dysuria and flank pain.  Musculoskeletal: Negative for back pain, neck pain and neck stiffness.  Skin: Negative for rash.  Neurological: Negative for light-headedness and headaches.      Allergies  Shrimp; Banana; Watermelon flavor; Other; Almond oil; and Sulfa antibiotics  Home Medications   Prior to Admission medications   Medication Sig Start Date End Date Taking? Authorizing Provider  amphetamine-dextroamphetamine (ADDERALL XR) 30 MG 24 hr capsule Take 30 mg by mouth daily as needed (For narcolepsy.).    Yes Historical Provider, MD  atorvastatin (LIPITOR) 10 MG tablet Take 10 mg by mouth at bedtime.    Yes Historical Provider, MD  flecainide (TAMBOCOR) 100 MG tablet Take 1 tablet by mouth 2 (two) times daily. 01/22/13  Yes Historical Provider, MD  glipiZIDE (GLUCOTROL) 10 MG 24 hr tablet Take 10 mg by mouth 2 (two) times daily.  02/28/11  Yes Historical Provider, MD  imipramine (TOFRANIL) 25 MG tablet Take 1 tablet (25 mg total) by mouth at bedtime. 05/14/14  Yes Waymon Budge, MD  LANTUS SOLOSTAR 100 UNIT/ML injection Inject 30 Units into the skin at bedtime.  04/22/12  Yes Historical Provider, MD  lisinopril-hydrochlorothiazide (PRINZIDE,ZESTORETIC) 10-12.5 MG per tablet Take 1 tablet  by mouth at bedtime.    Yes Historical Provider, MD  metFORMIN (GLUCOPHAGE) 500 MG tablet Take 1,000 mg by mouth 2 (two) times daily.    Yes Historical Provider, MD  pantoprazole (PROTONIX) 40 MG tablet Take 40 mg by mouth every morning.   Yes Historical Provider, MD  XARELTO 20 MG TABS Take 20 mg by mouth at bedtime.  01/22/13  Yes Historical Provider, MD  ondansetron (ZOFRAN ODT) 4 MG disintegrating tablet  ODT q4 hours prn nausea/vomit 06/24/14    Enid Skeens, MD   BP 152/95  Pulse 79  Temp(Src) 98 F (36.7 C) (Oral)  Resp 16  SpO2 98% Physical Exam  Nursing note and vitals reviewed. Constitutional: He is oriented to person, place, and time. He appears well-developed and well-nourished.  HENT:  Head: Normocephalic and atraumatic.  Eyes: Conjunctivae are normal. Right eye exhibits no discharge. Left eye exhibits no discharge.  Neck: Normal range of motion. Neck supple. No tracheal deviation present.  Cardiovascular: Normal rate and regular rhythm.   Pulmonary/Chest: Effort normal and breath sounds normal.  Abdominal: Soft. He exhibits no distension. There is tenderness (mild epig). There is no guarding.  Musculoskeletal: He exhibits no edema.  Neurological: He is alert and oriented to person, place, and time.  Skin: Skin is warm. No rash noted.  Psychiatric: He has a normal mood and affect.    ED Course  Procedures (including critical care time)  EMERGENCY DEPARTMENT BILIARY ULTRASOUND INTERPRETATION "Study: Limited Abdominal Ultrasound of the gallbladder and common bile duct."  INDICATIONS: Abdominal pain and Nausea Indication: Multiple views of the gallbladder and common bile duct were obtained in real-time with a Multi-frequency probe." PERFORMED BY:  Myself IMAGES ARCHIVED?: Yes FINDINGS: Gallstones absent, Gallbladder wall normal in thickness and Sonographic Murphy's sign absent LIMITATIONS: Bowel Gas INTERPRETATION: Normal   Labs Review Labs Reviewed  CBG MONITORING, ED - Abnormal; Notable for the following:    Glucose-Capillary 218 (*)    All other components within normal limits    Imaging Review No results found.   EKG Interpretation None      MDM   Final diagnoses:  Epigastric discomfort  Nausea  Essential hypertension   Patient with recurrent epigastric pain and nausea for the past 3 days. Patient has no symptoms on assessment in the ER and feels improved. Abdominal exam  benign. Bedside ultrasound no signs of cholelithiasis or cholecystitis. Recent blood work reviewed no acute findings. Patient comfortable with outpatient followup and will followup for blood pressure management.  Results and differential diagnosis were discussed with the patient/parent/guardian. Close follow up outpatient was discussed, comfortable with the plan.   Medications  cloNIDine (CATAPRES) tablet 0.1 mg (0.1 mg Oral Given 06/24/14 1313)  famotidine (PEPCID) tablet 20 mg (20 mg Oral Given 06/24/14 1312)    Filed Vitals:   06/24/14 1042 06/24/14 1313  BP: 192/102 152/95  Pulse: 79   Temp: 98 F (36.7 C)   TempSrc: Oral   Resp: 16   SpO2: 98%          Enid Skeens, MD 06/24/14 1340

## 2014-06-24 NOTE — Discharge Instructions (Signed)
It is very important for you to followup with her primary Dr. for blood pressure management. Return to the ER she develop chest pain, shortness of breath or pass out or worsening symptoms. Take your acid medications as directed for 2 weeks.  If you were given medicines take as directed.  If you are on coumadin or contraceptives realize their levels and effectiveness is altered by many different medicines.  If you have any reaction (rash, tongues swelling, other) to the medicines stop taking and see a physician.   Please follow up as directed and return to the ER or see a physician for new or worsening symptoms.  Thank you. Filed Vitals:   06/24/14 1042 06/24/14 1313  BP: 192/102 152/95  Pulse: 79   Temp: 98 F (36.7 C)   TempSrc: Oral   Resp: 16   SpO2: 98%

## 2014-07-08 ENCOUNTER — Telehealth: Payer: Self-pay | Admitting: Internal Medicine

## 2014-07-08 MED ORDER — AMPHETAMINE-DEXTROAMPHET ER 30 MG PO CP24: 30.0000 mg | ORAL_CAPSULE | Freq: Every day | ORAL | Status: DC | PRN

## 2014-07-08 NOTE — Telephone Encounter (Signed)
Called pt. She last had refill on adderall 30 mg 06/08/14 #30 x 0 refills Please advise Dr. Maple Hudson thanks

## 2014-07-08 NOTE — Telephone Encounter (Signed)
RX placed on [facility] cart. Please advise once ready for pick up. thanks

## 2014-07-08 NOTE — Telephone Encounter (Signed)
Pt aware that Rx at front for pick up.

## 2014-07-08 NOTE — Telephone Encounter (Signed)
Ok to refill 

## 2014-07-16 ENCOUNTER — Ambulatory Visit: Payer: Self-pay | Admitting: Interventional Cardiology

## 2014-07-17 ENCOUNTER — Ambulatory Visit: Payer: Self-pay | Admitting: Internal Medicine

## 2014-07-23 ENCOUNTER — Telehealth: Payer: Self-pay | Admitting: Internal Medicine

## 2014-07-23 NOTE — Telephone Encounter (Signed)
[facility] was sent down to them today. They will have updates for him tomorrow if needed. Pt is aware and nothing more needed at this time.

## 2014-08-07 ENCOUNTER — Telehealth: Payer: Self-pay | Admitting: Internal Medicine

## 2014-08-07 MED ORDER — AMPHETAMINE-DEXTROAMPHET ER 30 MG PO CP24: 30.0000 mg | ORAL_CAPSULE | Freq: Every day | ORAL | Status: DC | PRN

## 2014-08-07 NOTE — Telephone Encounter (Signed)
Called and spoke to pt.  Pt requesting Adderall XR 30 mg, qd prn. Last refilled on 07/08/14 for #30 with 0 refills.  Last OV with CY on 05/14/14 with pending appt on 09/14/14.  CY please advise on refill.  Allergies  Allergen Reactions  . Shrimp [Shellfish Allergy] Anaphylaxis  . Banana Other (See Comments)    Pt gags  . Watermelon Flavor Nausea And Vomiting  . Other Itching    Grapes  . Almond Oil Itching    Roof of mouth itches  . Sulfa Antibiotics Itching    Current Outpatient Prescriptions on File Prior to Visit  Medication Sig Dispense Refill  . amphetamine-dextroamphetamine (ADDERALL XR) 30 MG 24 hr capsule Take 1 capsule (30 mg total) by mouth daily as needed (For narcolepsy.).  30 capsule  0  . atorvastatin (LIPITOR) 10 MG tablet Take 10 mg by mouth at bedtime.       . flecainide (TAMBOCOR) 100 MG tablet Take 1 tablet by mouth 2 (two) times daily.      Marland Kitchen. glipiZIDE (GLUCOTROL) 10 MG 24 hr tablet Take 10 mg by mouth 2 (two) times daily.       Marland Kitchen. imipramine (TOFRANIL) 25 MG tablet Take 1 tablet (25 mg total) by mouth at bedtime.  30 tablet  3  . LANTUS SOLOSTAR 100 UNIT/ML injection Inject 30 Units into the skin at bedtime.       Marland Kitchen. lisinopril-hydrochlorothiazide (PRINZIDE,ZESTORETIC) 10-12.5 MG per tablet Take 1 tablet by mouth at bedtime.       . metFORMIN (GLUCOPHAGE) 500 MG tablet Take 1,000 mg by mouth 2 (two) times daily.       . ondansetron (ZOFRAN ODT) 4 MG disintegrating tablet 4mg  ODT q4 hours prn nausea/vomit  4 tablet  0  . pantoprazole (PROTONIX) 40 MG tablet Take 40 mg by mouth every morning.      Carlena Hurl. XARELTO 20 MG TABS Take 20 mg by mouth at bedtime.        No current facility-administered medications on file prior to visit.

## 2014-08-07 NOTE — Telephone Encounter (Signed)
Rx up front for pick up  Pt aware  Nothing further needed

## 2014-08-07 NOTE — Telephone Encounter (Signed)
Ok to refill 

## 2014-08-12 ENCOUNTER — Ambulatory Visit: Payer: Self-pay | Admitting: Interventional Cardiology

## 2014-09-08 ENCOUNTER — Encounter: Payer: Self-pay | Admitting: Interventional Cardiology

## 2014-09-09 ENCOUNTER — Telehealth: Payer: Self-pay | Admitting: Internal Medicine

## 2014-09-09 MED ORDER — AMPHETAMINE-DEXTROAMPHET ER 30 MG PO CP24: 30.0000 mg | ORAL_CAPSULE | Freq: Every day | ORAL | Status: DC | PRN

## 2014-09-09 NOTE — Telephone Encounter (Signed)
Called and spoke to pt. Pt is requesting refill of Adderall 30mg .  Last filled on 08/07/14 for #30, 0 refills. 1 tab qd prn.  Pt last seen on 05/14/14.  CY please advise.   Allergies  Allergen Reactions  . Shrimp [Shellfish Allergy] Anaphylaxis  . Banana Other (See Comments)    Pt gags  . Watermelon Flavor Nausea And Vomiting  . Other Itching    Grapes  . Almond Oil Itching    Roof of mouth itches  . Sulfa Antibiotics Itching    Current Outpatient Prescriptions on File Prior to Visit  Medication Sig Dispense Refill  . amphetamine-dextroamphetamine (ADDERALL XR) 30 MG 24 hr capsule Take 1 capsule (30 mg total) by mouth daily as needed (For narcolepsy.). 30 capsule 0  . atorvastatin (LIPITOR) 10 MG tablet Take 10 mg by mouth at bedtime.     . flecainide (TAMBOCOR) 100 MG tablet Take 1 tablet by mouth 2 (two) times daily.    Marland Kitchen. glipiZIDE (GLUCOTROL) 10 MG 24 hr tablet Take 10 mg by mouth 2 (two) times daily.     Marland Kitchen. imipramine (TOFRANIL) 25 MG tablet Take 1 tablet (25 mg total) by mouth at bedtime. 30 tablet 3  . LANTUS SOLOSTAR 100 UNIT/ML injection Inject 30 Units into the skin at bedtime.     Marland Kitchen. lisinopril-hydrochlorothiazide (PRINZIDE,ZESTORETIC) 10-12.5 MG per tablet Take 1 tablet by mouth at bedtime.     . metFORMIN (GLUCOPHAGE) 500 MG tablet Take 1,000 mg by mouth 2 (two) times daily.     . ondansetron (ZOFRAN ODT) 4 MG disintegrating tablet 4mg  ODT q4 hours prn nausea/vomit 4 tablet 0  . pantoprazole (PROTONIX) 40 MG tablet Take 40 mg by mouth every morning.    Carlena Hurl. XARELTO 20 MG TABS Take 20 mg by mouth at bedtime.      No current facility-administered medications on file prior to visit.

## 2014-09-09 NOTE — Telephone Encounter (Signed)
LMTCB x 2  Rx on cork board in triage

## 2014-09-09 NOTE — Telephone Encounter (Signed)
Rx on CY cart to sign. Please return to triage when signed. Thanks. LM for pt to return call x 1

## 2014-09-09 NOTE — Telephone Encounter (Signed)
Ok to refill, Thanks

## 2014-09-11 NOTE — Telephone Encounter (Signed)
lmomtcb x 3  

## 2014-09-11 NOTE — Telephone Encounter (Signed)
Spoke with the pt and will come today to pick-up rx. Mindy advised and rx placed at front.  Carron CurieJennifer Castillo, CMA

## 2014-09-14 ENCOUNTER — Ambulatory Visit: Payer: Self-pay | Admitting: Internal Medicine

## 2014-10-12 ENCOUNTER — Ambulatory Visit: Payer: Self-pay | Admitting: Internal Medicine

## 2014-10-14 ENCOUNTER — Telehealth: Payer: Self-pay | Admitting: Internal Medicine

## 2014-10-14 MED ORDER — AMPHETAMINE-DEXTROAMPHET ER 30 MG PO CP24: 30.0000 mg | ORAL_CAPSULE | Freq: Every day | ORAL | Status: DC | PRN

## 2014-10-14 NOTE — Telephone Encounter (Signed)
rx has been printed out and placed on CY cart to be signed.  Will call pt once ready to be picked up.

## 2014-10-14 NOTE — Telephone Encounter (Signed)
Called and spoke with pt and he is aware that rx is up front and ready to be picked up.  Nothing further is needed.

## 2014-10-29 ENCOUNTER — Encounter: Payer: Self-pay | Admitting: Internal Medicine

## 2014-10-29 ENCOUNTER — Ambulatory Visit (INDEPENDENT_AMBULATORY_CARE_PROVIDER_SITE_OTHER): Payer: 59 | Admitting: Internal Medicine

## 2014-10-29 VITALS — BP 128/82 | HR 103 | Ht 69.0 in | Wt 315.8 lb

## 2014-10-29 DIAGNOSIS — G47411 Narcolepsy with cataplexy: Secondary | ICD-10-CM

## 2014-10-29 DIAGNOSIS — I4891 Unspecified atrial fibrillation: Secondary | ICD-10-CM

## 2014-10-29 DIAGNOSIS — G4733 Obstructive sleep apnea (adult) (pediatric): Secondary | ICD-10-CM

## 2014-10-29 MED ORDER — FLUTICASONE PROPIONATE 50 MCG/ACT NA SUSP: NASAL | Status: DC

## 2014-10-29 MED ORDER — TRAMADOL HCL 50 MG PO TABS: ORAL_TABLET | ORAL | Status: DC

## 2014-10-29 NOTE — Progress Notes (Signed)
01/25/12- [age] yoM former smoker comes to reestablish for management of obstructive sleep apnea complicated by history of allergic rhinitis and atrial fibrillation, diabetes, HTN. PCP- Dr Trula Sladeon [facility] NPSG 11/23/00- AHI 20 per hour. He continues using CPAP all night every night. 14 CWP/[employer]. Using a fullface mask but putting it on so only covers his nose,  otherwise it smothers him. Fighting daytime sleepiness which affects his work. Hypnagogic hallucination associated with dreaming. 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 Vivid dreams as soon as he falls asleep. Wakes irritable from naps. Bedtime 9:30 PM, very brief sleep latency, wakes 4 times for nocturia before up at 4:30 AM. No sleeping pill. 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  Quit smoking 2009, 1 beer/ month, little caffeine. Unmarried, living w/ sister, has girlfriend Fam hx- Father hx MI, Mother dcs'd DM  05/09/12- [age] yoM former smoker comes to reestablish for management of obstructive sleep apnea complicated by history of allergic rhinitis and atrial fibrillation, diabetes, HTN. PCP- Dr Trula Sladeon [facility] He returns to followup and reports use of CPAP 13/ [employer] most nights, all night " a whole 'nother world". He sleeps soundly and now with CPAP he is sleeping through his morning alarm clock which has caused him to miss work. Bedtime is 9:30 PM and he is trying to get up at 5:15 AM. Still drowsy after meals and if sitting quietly. He also has an urge to sleep if he gets angry and at that time recognizes dreaming. He does not specifically describe muscle weakness with these episodes. He gives a possible history of sleep paralysis.  10/21/12- 41 yoM former smoker followed for management of obstructive sleep apnea/ hypersomnia complicated by history of allergic rhinitis and atrial fibrillation, diabetes, HTN. PCP- Dr Trula Sladeon [facility] FOLLOWS FOR: using CPAP/ 13/ [employer] every night for 5 hours per night. denies any  problems with the machine or the mask. He is still fighting daytime sleepiness and falling asleep at work. He struggles with this because he has to drive. Strong emotion makes him feel weak so that he needs to sit down. He recognizes sleep paralysis as an occasional event as well as hypnagogic hallucination. Caffeine has little daytime effect. 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 Incidental request for referral to an eye doctor because of his diabetes. He would like to go back to Dr. Luciana Axeankin who had seen him in the past. Rhinitis is improved by Flonase, asks refill.  01/14/13-41 yoM former smoker followed for management of obstructive sleep apnea/ hypersomnia complicated by history of allergic rhinitis and atrial fibrillation, diabetes, HTN. PCP- Dr Trula Sladeon [facility] FOLLOWS FOR: Wears CPAP 13/ [employer] every night for about 6-8 hours; pressure working well for patient.  He still can get drowsy if he sits quietly. Caffeine has no effect. Nuvigil was no help. Multiple Sleep Latency Test 10/30/2012-pathologic daytime hypersomnia, nonspecific, compatible with idiopathic hypersomnia or narcolepsy. Mean latency 0.9 minutes with one sleep onset REM event. He has been taken off of driving at work, he says because of an issue with his blood sugar being too high.  02/06/13- 41 yoM former smoker followed for management of obstructive sleep apnea and hypersomnia/ narcolepsy with cataplexy complicated by history of allergic rhinitis and atrial fibrillation, diabetes, HTN. PCP- Dr Trula Sladeon [facility] FOLLOWS FOR: wears CPAP every night for about 7 hours;pressure doing well. Recent hospital stay Hospital twice for AFib/ CHF. Also question of pneumonia. We discussed  potential for methylphenidate to precipitate atrial fibrillation which was a chronic diagnosis for him. He thinks it may be more anxious in the morning, not necessarily more alert. 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 He now describes waking with sleep paralysis and recognizes my description of hypnagogic hallucination. If startled he gets an uncomfortable weak aching feeling in the back of his neck, his knees get weak, sounds like cataplexy. I had not gotten is clear description before.  03/03/13- 29 yoM former smoker followed for management of obstructive sleep apnea and hypersomnia/ narcolepsy with cataplexy complicated by history of allergic rhinitis and atrial fibrillation, diabetes, HTN. PCP- Dr Trula Slade 1 month follow up.  Wearing cpap 13/ [employer] everynight for approx 7 hours.  No problems with mask or pressure, he does follow some air. Still having daytime sleepimess.  Adderall helps him feel alert but lasts only 2 hours, and he still does. 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 Pressure reduced to 11 due to gas pains.   05/05/13- 41 yoM former smoker followed for management of obstructive sleep apnea and hypersomnia/ narcolepsy with cataplexy complicated by history of allergic rhinitis and atrial fibrillation, diabetes, HTN. PCP- Dr Trula Slade FOLLOWS FOR: pt reports wearing CPAP11/ [employer] machine everynight x 7 hrs per night -- pt reports pressure setting is comfortable no supplies needed-- denies any other concerns at this time Continues Dexedrine, one at 5 AM and one at lunch time. Any later in the day interferes with sleep at night. 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  11/14/13- 41 yoM former smoker followed for management of obstructive sleep apnea and hypersomnia/ narcolepsy with cataplexy complicated by history of allergic rhinitis and atrial fibrillation, diabetes, HTN. PCP- Dr Trula Slade CPAP11/ [facility] Allegra all night every night Working for his [employer] when he can.  Signed for total disability as of 01/17/2013. Sleep is about the same. He paces  himself during the day. Stopped Dexedrine. CXR 01/23/13 IMPRESSION:  Cardiomegaly prominent perihilar markings consistent with  bronchitis and possibly mild pulmonary vascular congestion.  Consider follow-up.  Original Report Authenticated By: Dwyane Dee, [name]  05/14/14- 41 yoM former smoker followed for management of obstructive sleep apnea and hypersomnia/ narcolepsy with cataplexy complicated by history of allergic rhinitis and atrial fibrillation, diabetes, HTN. PCP- Dr Trula Slade FOLLOWS FOR: pt states he wishes to change from adderall to another medication.  Missed appt with [facility] last week to get a new cpap 11 (was [facility]- now ? [facility]), hasn't rescheduled yet .  Wears cpap "off and on" X few hours per night.   Preferred Dexedrine over Adderall, but too expensive.frequent cataplexy Now has lawyer helping him with disability application CXR 11/19/13 IMPRESSION:  1. Mild cardiomegaly. Otherwise, no significant abnormalities are  observed.  Electronically Signed  By: Herbie Baltimore [name]  On: 11/19/2013 18:24  10/29/14- 48 yoM former smoker followed for management of obstructive sleep apnea and hypersomnia/ narcolepsy with cataplexy complicated by history of allergic rhinitis and atrial fibrillation, dCHF, diabetes, HTN. PCP- Dr Trula Slade FOLLOWS FOR:not wearing CPAP 13/ [facility] as he should Download documents inadequate use but satisfactory control. Says bad toothache made worse by airflow from CPAP and nasal congestion worse some nights where he takes mask off. Okay on Adderall XR 30 mg 1 daily. Denies any cocaine use any longer.  ROS-see HPI  Constitutional:   No-   weight loss, night sweats, fevers, chills,  +fatigue, lassitude. HEENT:   No-  headaches, difficulty swallowing, tooth/dental problems, sore throat,       No-  sneezing, itching, ear ache, nasal congestion, post nasal drip,  CV:  No-   chest pain, orthopnea, PND, swelling in lower extremities, anasarca, dizziness,  palpitations Resp: No-   shortness of breath with exertion or at rest.              No-   productive cough,  No non-productive cough,  No- coughing up of blood.              No-   change in color of mucus.  No- wheezing.   Skin: No-   rash or lesions. GI:  No-   heartburn, indigestion, abdominal pain, nausea, vomiting,  GU:  MS:  No-   joint pain or swelling.  Neuro-     nothing unusual Psych:  No- change in mood or affect. No depression or anxiety.  No memory loss.  OBJ- Physical Exam  +he fought drowsiness intermittently as we talked but never lost contact. General- Alert/ calm, Oriented, Affect-appropriate, Distress- none acute, +obese,  Skin- rash-none, lesions- none, excoriation- none Lymphadenopathy- none Head- atraumatic            Eyes- Gross vision intact, PERRLA, conjunctivae and secretions clear            Ears- Hearing, canals-normal            Nose- rhinitis/ turbinate edema, no-Septal dev, mucus, polyps, erosion, perforation             Throat- Mallampati III , mucosa clear , drainage- none, tonsils- atrophic Neck- flexible , trachea midline, no stridor , thyroid nl, carotid no bruit Chest - symmetrical excursion , unlabored           Heart/CV- RRR , no murmur , no gallop  , no rub, nl s1 s2                           - JVD- none , edema- none, stasis changes- none, varices- none           Lung- clear to P&A, wheeze- none, cough- none , dullness-none, rub- none           Chest wall-  Abd-  Br/ Gen/ Rectal- Not done, not indicated Extrem- cyanosis- none, clubbing, none, atrophy- none, strength- nl

## 2014-10-29 NOTE — Patient Instructions (Signed)
Use your CPAP as much as you can- the minimum is 4 hours per night, and our goal is all night, every night.  Script for Flonase nasal spray  Script for Tramadol to use for toothache until you get to dentist  We can continue adderall  Please call as needed

## 2014-10-31 NOTE — Assessment & Plan Note (Signed)
Patient tolerated use of Adderall without making heart rhythm worse

## 2014-10-31 NOTE — Assessment & Plan Note (Signed)
Emphasized importance of compliant CPAP use. Pressure of 13 is adequate. Plan-address his complaints of nasal congestion with Flonase and toothache pain with tramadol until he can see his dentist. He offered these explanations for why he wasn't wearing CPAP regularly.

## 2014-10-31 NOTE — Assessment & Plan Note (Signed)
Cataplexy was noted at previous visit but not this time. Control seems marginally adequate. I emphasized the importance of subtracting any sleepiness from sleep apnea cause Plan- continue Adderall XR,, sleep hygiene

## 2014-12-10 ENCOUNTER — Encounter: Payer: Self-pay | Admitting: Physician Assistant

## 2014-12-10 ENCOUNTER — Ambulatory Visit (INDEPENDENT_AMBULATORY_CARE_PROVIDER_SITE_OTHER): Payer: 59 | Admitting: Physician Assistant

## 2014-12-10 VITALS — BP 141/77 | HR 91 | Ht 69.0 in | Wt 322.0 lb

## 2014-12-10 DIAGNOSIS — I480 Paroxysmal atrial fibrillation: Secondary | ICD-10-CM

## 2014-12-10 DIAGNOSIS — G4733 Obstructive sleep apnea (adult) (pediatric): Secondary | ICD-10-CM

## 2014-12-10 DIAGNOSIS — I10 Essential (primary) hypertension: Secondary | ICD-10-CM

## 2014-12-10 DIAGNOSIS — E119 Type 2 diabetes mellitus without complications: Secondary | ICD-10-CM

## 2014-12-10 DIAGNOSIS — I5032 Chronic diastolic (congestive) heart failure: Secondary | ICD-10-CM

## 2014-12-10 DIAGNOSIS — R9431 Abnormal electrocardiogram [ECG] [EKG]: Secondary | ICD-10-CM

## 2014-12-10 MED ORDER — METOPROLOL SUCCINATE ER 25 MG PO TB24: 25.0000 mg | ORAL_TABLET | Freq: Every day | ORAL | Status: DC

## 2014-12-10 MED ORDER — FLECAINIDE ACETATE 100 MG PO TABS: 100.0000 mg | ORAL_TABLET | Freq: Two times a day (BID) | ORAL | Status: DC

## 2014-12-10 MED ORDER — RIVAROXABAN 20 MG PO TABS: 20.0000 mg | ORAL_TABLET | Freq: Every day | ORAL | Status: DC

## 2014-12-10 NOTE — Patient Instructions (Addendum)
Your physician has recommended you make the following change in your medication:    START TOPROL XL 25 MG ONCE A DAY  RX FILLED   CONTINUE FLECAINIDE 100 MG TWICE A DAY  RX FILLED   XERALTO RX FILLED    Your physician wants you to follow-up in:  6 MONTHS WITH DR Eldridge DaceVARANASI  You will receive a reminder letter in the mail two months in advance. If you don't receive a letter, please call our office to schedule the follow-up appointment.   Your physician has requested that you have en exercise stress myoview. For further information please visit [email or web address]. Please follow instruction sheet, as given.

## 2014-12-10 NOTE — Progress Notes (Signed)
Cardiology Office Note   Date:  12/10/2014   ID:  [name], DOB 10/03/1971, MRN [id number]  PCP:  Geraldo Pitter, MD  Cardiologist:  Dr. Everette Rank    Electrophysiologist:  Dr. [name] (evaluated in [facility] in 2014)  Chief Complaint  Patient presents with  . Atrial Fibrillation    follow up     History of Present Illness: [name] is a 44 y.o. male with a hx of HTN, DM2, obesity, OSA on CPAP, prior ETOH/cocaine use and PAF.  Remotely seen by Dr. Reyes Ivan in the past.  He was evaluated by Dr. Delane Ginger in 4/14 in the hospital during an admission with PAF with RVR.  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   He converted to NSR and long-term anticoagulation was recommended given his stroke risk.  Flecainide was added to maintain NSR.  He was to follow up in this office for ETT to rule out pro-arrhythmia, but has not been seen here since DC. Review of his chart indicates he had an ETT at [facility] with Dr. Everette Rank in 02/2013.  Last seen in 02/2013  He returns for FU. Unfortunately, he never got a reminder to FU.  He decided to call for FU as it had been a long time since he had been seen.  The patient denies chest pain, shortness of breath, syncope, orthopnea, PND or significant pedal edema.  He is NYHA 2-2b.  He sleeps with CPAP.     Studies/Reports Reviewed Today:  GXT 5/14:  No ischemic EKG changes  Echocardiogram 4/14 - Moderate LVH. Systolic function was vigorous. EF 65-70%. Wall motion was normal.  Grade 2 diastolic dysfunction. - Aorta: Mildly dilated aortic root and ascending aorta. Ascending aorta dimension: 40 mm. Aortic root dimension: 38mm (ED). - Left atrium: The atrium was mildly dilated.    Past Medical History  Diagnosis Date  . Hypertension   . Diabetes mellitus   . Sleep apnea   . Morbid obesity   . History of cocaine use   .  History of alcohol abuse   . Paroxysmal atrial fibrillation     Occurring in 2008, with several recurrence since then (including in the setting of + cocaine on UDS).  . Obesity     Past Surgical History  Procedure Laterality Date  . Appendectomy    . Rotator cuff repair    . Tonsillectomy       Current Outpatient Prescriptions  Medication Sig Dispense Refill  . amphetamine-dextroamphetamine (ADDERALL XR) 30 MG 24 hr capsule Take 1 capsule (30 mg total) by mouth daily as needed (For narcolepsy.). 30 capsule 0  . atorvastatin (LIPITOR) 10 MG tablet Take 10 mg by mouth at bedtime.     . flecainide (TAMBOCOR) 100 MG tablet Take 1 tablet by mouth 2 (two) times daily.    . fluticasone (FLONASE) 50 MCG/ACT nasal spray 2 sprays each nostril once or twice daily every day while needed 16 g prn  . glipiZIDE (GLUCOTROL) 10 MG 24 hr tablet Take 10 mg by mouth 2 (two) times daily.     Marland Kitchen LANTUS SOLOSTAR 100 UNIT/ML injection Inject 30 Units into the skin at bedtime.     Marland Kitchen lisinopril-hydrochlorothiazide (PRINZIDE,ZESTORETIC) 10-12.5 MG per tablet Take 1 tablet by mouth at bedtime.     . metFORMIN (GLUCOPHAGE) 500 MG tablet Take 1,000 mg by mouth  2 (two) times daily.     . traMADol (ULTRAM) 50 MG tablet 1 or 2 every 8 hours if needed for pain 50 tablet 0  . XARELTO 20 MG TABS Take 20 mg by mouth at bedtime.      No current facility-administered medications for this visit.    Allergies:   Shrimp; Banana; Watermelon flavor; Other; Almond oil; and Sulfa antibiotics    Social History:  The patient  reports that he quit smoking about 7 years ago. His smokeless tobacco use includes Snuff. He reports that he drinks alcohol. He reports that he does not use illicit drugs.   Family History:  The patient's family history includes Diabetes in his mother; Heart attack in his father; Stroke in his maternal grandmother and paternal grandmother.    ROS:   Please see the history of present illness.   Review of  Systems  Psychiatric/Behavioral: Positive for depression. The patient is nervous/anxious.        Lost job.  Had to go on disability  All other systems reviewed and are negative.    PHYSICAL EXAM: VS:  BP 141/77 mmHg  Pulse 91  Ht  (1.753 m)  Wt 322 lb (146.058 kg)  BMI 47.53 kg/m2    Wt Readings from Last 3 Encounters:  10/29/14 315 lb 12.8 oz (143.246 kg)  05/14/14 312 lb (141.522 kg)  11/19/13 321 lb (145.605 kg)     GEN: Well nourished, well developed, in no acute distress HEENT: normal Neck: no JVD, no carotid bruits, no masses Cardiac:  Normal S1/S2, RRR; no murmur, no rubs or gallops, no edema  Respiratory:  clear to auscultation bilaterally, no wheezing, rhonchi or rales. GI: soft, nontender, nondistended, + BS MS: no deformity or atrophy Skin: warm and dry  Neuro:  CNs II-XII intact, Strength and sensation are intact Psych: Normal affect   EKG:  EKG is ordered today.  It demonstrates:   NSR, HR 91, normal axis, QRS 76, QTc 447, inf-lat TWI.   Recent Labs: 06/22/2014: ALT 34; BUN 16; Creatinine 0.94; Hemoglobin 13.8; Platelets 273; Potassium 4.3; Sodium 137    Lipid Panel    Component Value Date/Time   CHOL 135 01/21/2013 0200   TRIG 135 01/21/2013 0200   HDL 20* 01/21/2013 0200   CHOLHDL 6.8 01/21/2013 0200   VLDL 27 01/21/2013 0200   LDLCALC 88 01/21/2013 0200      ASSESSMENT AND PLAN:  1.  Paroxysmal Atrial Fibrillation:  Maintaining NSR. He ran out of Flecainide and Xarelto 2 weeks ago.  He stopped taking Diltiazem several mos ago due to low BPs. His ECG is different from the last ECG last year.  He now has inf-lat TWI.  He has diabetes and smokes.  He denies any symptoms to suggest angina.  I reviewed his ECG with Dr. Charlton Haws (DOD) today.    -  Restart Flecainide 100 mg Twice daily     -  Restart Xarelto 20 mg QD.    -  Start Toprol-XL 25 mg QD.    -  Arrange ETT-Myoview to rule out pro-arrhythmia and assess for ischemia given abnormal  EKG. 2.  HTN:  BP with borderline control.  Add Toprol as noted.  3.  Chronic Diastolic CHF:  This was in the context of AF with RVR.  His volume is stable.  4.  Diabetes Mellitus:  Continue FU with PCP.  5.  OSA:  Continue FU with Pulmonary.  Continue CPAP.  Current medicines are reviewed at length with the patient today.  The patient does not have concerns regarding medicines.  The following changes have been made:  As above.   Labs/ tests ordered today include:  Orders Placed This Encounter  Procedures  . Myocardial Perfusion Imaging  . EKG 12-Lead    Disposition:   FU with Dr. Everette RankJay Varanasi  in 6 months or sooner if myoview abnormal.     Signed, Brynda RimScott Weaver, PA-C, MHS 12/10/2014 2:02 PM    [facility] [address], [place], [place]  [address] Phone: [phone]; Fax: [phone]

## 2014-12-15 ENCOUNTER — Telehealth: Payer: Self-pay | Admitting: Internal Medicine

## 2014-12-15 MED ORDER — AMPHETAMINE-DEXTROAMPHET ER 30 MG PO CP24: 30.0000 mg | ORAL_CAPSULE | Freq: Every day | ORAL | Status: DC | PRN

## 2014-12-15 NOTE — Telephone Encounter (Signed)
Rx has been printed and signed. It has been placed up front up for pick up. Pt is aware.

## 2014-12-15 NOTE — Telephone Encounter (Signed)
Last OV 10/29/14 Pending OV 03/10/15 Last refill 10/14/14 #30  CY - please advise on refill.

## 2014-12-15 NOTE — Telephone Encounter (Signed)
Ok to refill 

## 2014-12-22 ENCOUNTER — Ambulatory Visit (HOSPITAL_COMMUNITY): Payer: 59 | Attending: Cardiology | Admitting: Radiology

## 2014-12-22 DIAGNOSIS — R0789 Other chest pain: Secondary | ICD-10-CM

## 2014-12-22 DIAGNOSIS — R9431 Abnormal electrocardiogram [ECG] [EKG]: Secondary | ICD-10-CM | POA: Insufficient documentation

## 2014-12-22 DIAGNOSIS — R0609 Other forms of dyspnea: Secondary | ICD-10-CM

## 2014-12-22 MED ORDER — TECHNETIUM TC 99M SESTAMIBI GENERIC - CARDIOLITE
33.0000 | Freq: Once | INTRAVENOUS | Status: AC | PRN
  Administered 2014-12-22: 33 via INTRAVENOUS

## 2014-12-22 NOTE — Progress Notes (Signed)
[facility] SITE 3 NUCLEAR MED [address] [place], [place] [address] [phone]    Cardiology Nuclear Med Study  Douglas GamblesLeonidas Ella BodoClaude Walsh is a 44 y.o. male     MRN : [id number]     DOB: 1971/08/18  Procedure Date: 12/22/2014  Nuclear Med Background Indication for Stress Test:  Evaluation for Ischemia and Abnormal EKG History:  AFIB, H/O Cocaine Use 1 yr ago Cardiac Risk Factors: Hypertension,Atrial Fib and IDDM  Symptoms:  DOE and Palpitations   Nuclear Pre-Procedure Caffeine/Decaff Intake:  None NPO After: 9:00pm   Lungs:  clear O2 Sat: 95% on room air. IV 0.9% NS with Angio Cath:  22g  IV Site: R Hand  IV Started by:  Cathlyn Parsonsynthia Hasspacher, RN  Chest Size (in):  56 Cup Size: n/a  Height: 5\' 9"  (1.753 m)  Weight:  304 lb (137.893 kg)  BMI:  Body mass index is 44.87 kg/(m^2). Tech Comments:  n/a    Nuclear Med Study 1 or 2 day study: 2 day  Stress Test Type:  Stress  Reading MD: n/a  Order Authorizing Provider:  Floydene FlockJay Varanasi,MD  Resting Radionuclide: Technetium 53100m Sestamibi  Resting Radionuclide Dose: 33.0 mCi on 12/29/14   Stress Radionuclide:  Technetium 71100m Sestamibi  Stress Radionuclide Dose: 33.0 mCi on 12/22/14           Stress Protocol Rest HR: 77 Stress HR: 162  Rest BP: 122/70 Stress BP: 198/69  Exercise Time (min): 8:00 METS: 10.10   Predicted Max HR: 176 bpm % Max HR: 92.05 bpm Rate Pressure Product: [id number]   Dose of Adenosine (mg):  n/a Dose of Lexiscan: n/a mg  Dose of Atropine (mg): n/a Dose of Dobutamine: n/a mcg/kg/min (at max HR)  Stress Test Technologist: Milana NaSabrina Williams, EMT-P  Nuclear Technologist:  Kerby NoraElzbieta Kubak, CNMT     Rest Procedure:  Myocardial perfusion imaging was performed at rest 45 minutes following the intravenous administration of Technetium 71100m Sestamibi. Rest ECG: NSR - Normal EKG  Stress Procedure:  The patient exercised on the treadmill utilizing the Bruce Protocol for 8:00 minutes. The patient stopped due to sob,  fatigue, and denied any chest pain.  Technetium 74100m Sestamibi was injected at peak exercise and myocardial perfusion imaging was performed after a brief delay. Stress ECG: No significant change from baseline ECG  QPS Raw Data Images:  Normal; no motion artifact; normal heart/lung ratio. Stress Images:  There is decreased uptake in the inferior wall. Rest Images:  inferior and lateral decreased uptake Subtraction (SDS):  No evidence of ischemia. Transient Ischemic Dilatation (Normal <1.22):  1.29 Lung/Heart Ratio (Normal <0.45):  0.31  Quantitative Gated Spect Images QGS EDV:  134 ml QGS ESV:  56 ml  Impression Exercise Capacity:  Good exercise capacity. BP Response:  Normal blood pressure response. Clinical Symptoms:  No significant symptoms noted. ECG Impression:  No significant ST segment change suggestive of ischemia. Comparison with Prior Nuclear Study: No previous nuclear study performed  Overall Impression:  Low risk stress nuclear study without reversible ischemia.  LV Ejection Fraction: 58%.  LV Wall Motion:  Normal Wall Motion   Chrystie NoseKenneth C. Oliverio Cho, MD, [facility] Board Certified in Nuclear Cardiology Attending Cardiologist [facility]

## 2014-12-29 ENCOUNTER — Ambulatory Visit (HOSPITAL_COMMUNITY): Payer: 59 | Attending: Cardiovascular Disease

## 2014-12-29 DIAGNOSIS — R0989 Other specified symptoms and signs involving the circulatory and respiratory systems: Secondary | ICD-10-CM

## 2014-12-29 MED ORDER — TECHNETIUM TC 99M SESTAMIBI GENERIC - CARDIOLITE
33.0000 | Freq: Once | INTRAVENOUS | Status: AC | PRN
  Administered 2014-12-29: 33 via INTRAVENOUS

## 2014-12-30 ENCOUNTER — Encounter: Payer: Self-pay | Admitting: Physician Assistant

## 2015-01-22 ENCOUNTER — Telehealth: Payer: Self-pay | Admitting: Internal Medicine

## 2015-01-22 MED ORDER — AMPHETAMINE-DEXTROAMPHET ER 30 MG PO CP24: 30.0000 mg | ORAL_CAPSULE | Freq: Every day | ORAL | Status: DC | PRN

## 2015-01-22 NOTE — Telephone Encounter (Signed)
Ok to refill 

## 2015-01-22 NOTE — Telephone Encounter (Signed)
Pt informed Rx is ready to be picked up. Nothing further needed/spm

## 2015-01-22 NOTE — Telephone Encounter (Signed)
Rx signed by CY. Rx placed up front for pick up.   lmtcb for pt to inform.

## 2015-01-22 NOTE — Telephone Encounter (Signed)
Last OV 10/29/14 Pending OV 03/10/15 Last refill 12/15/14 #30   CY - please advise on refill. Thanks.

## 2015-02-15 ENCOUNTER — Encounter: Payer: Self-pay | Admitting: Gastroenterology

## 2015-02-25 ENCOUNTER — Telehealth: Payer: Self-pay | Admitting: Internal Medicine

## 2015-02-25 MED ORDER — AMPHETAMINE-DEXTROAMPHET ER 30 MG PO CP24: 30.0000 mg | ORAL_CAPSULE | Freq: Every day | ORAL | Status: DC | PRN

## 2015-02-25 NOTE — Telephone Encounter (Signed)
Rx printed and left for Dr Maple HudsonYoung to sign.  Place rx at front desk for pick up when complete.

## 2015-02-25 NOTE — Telephone Encounter (Signed)
Ok to refill 

## 2015-02-25 NOTE — Telephone Encounter (Signed)
Last OV 10/29/14 Pending OV 03/10/15 Last refill 01/22/15 #30  CY - please advise on refill. Thanks.

## 2015-03-08 ENCOUNTER — Emergency Department (HOSPITAL_COMMUNITY)
Admission: EM | Admit: 2015-03-08 | Discharge: 2015-03-08 | Disposition: A | Discharge status: Home / Self Care | Payer: 59 | Attending: Emergency Medicine | Admitting: Emergency Medicine

## 2015-03-08 ENCOUNTER — Emergency Department (HOSPITAL_COMMUNITY): Payer: 59

## 2015-03-08 ENCOUNTER — Encounter (HOSPITAL_COMMUNITY): Payer: Self-pay | Admitting: Emergency Medicine

## 2015-03-08 DIAGNOSIS — E119 Type 2 diabetes mellitus without complications: Secondary | ICD-10-CM | POA: Diagnosis not present

## 2015-03-08 DIAGNOSIS — Z79899 Other long term (current) drug therapy: Secondary | ICD-10-CM | POA: Diagnosis not present

## 2015-03-08 DIAGNOSIS — M791 Myalgia: Secondary | ICD-10-CM | POA: Insufficient documentation

## 2015-03-08 DIAGNOSIS — Z8669 Personal history of other diseases of the nervous system and sense organs: Secondary | ICD-10-CM | POA: Diagnosis not present

## 2015-03-08 DIAGNOSIS — J069 Acute upper respiratory infection, unspecified: Secondary | ICD-10-CM | POA: Diagnosis not present

## 2015-03-08 DIAGNOSIS — Z720 Tobacco use: Secondary | ICD-10-CM | POA: Diagnosis not present

## 2015-03-08 DIAGNOSIS — Z7951 Long term (current) use of inhaled steroids: Secondary | ICD-10-CM | POA: Insufficient documentation

## 2015-03-08 DIAGNOSIS — I10 Essential (primary) hypertension: Secondary | ICD-10-CM | POA: Insufficient documentation

## 2015-03-08 DIAGNOSIS — R0602 Shortness of breath: Secondary | ICD-10-CM | POA: Diagnosis present

## 2015-03-08 LAB — BASIC METABOLIC PANEL
Anion gap: 9 (ref 5–15)
BUN: 11 mg/dL (ref 6–20)
CHLORIDE: 101 mmol/L (ref 101–111)
CO2: 25 mmol/L (ref 22–32)
Calcium: 8.7 mg/dL — ABNORMAL LOW (ref 8.9–10.3)
Creatinine, Ser: 1.08 mg/dL (ref 0.61–1.24)
GFR calc Af Amer: >60 mL/min (ref >60)
GFR calc non Af Amer: >60 mL/min (ref >60)
Glucose, Bld: 272 mg/dL — ABNORMAL HIGH (ref 65–99)
Potassium: 3.6 mmol/L (ref 3.5–5.1)
Sodium: 135 mmol/L (ref 135–145)

## 2015-03-08 LAB — I-STAT TROPONIN, ED
Troponin i, poc: 0 ng/mL (ref 0.00–0.08)
Troponin i, poc: 0 ng/mL (ref 0.00–0.08)

## 2015-03-08 LAB — BRAIN NATRIURETIC PEPTIDE: B Natriuretic Peptide: 436.9 pg/mL — ABNORMAL HIGH (ref 0.0–100.0)

## 2015-03-08 LAB — CBC
HCT: 39.4 % (ref 39.0–52.0)
HEMOGLOBIN: 13.6 g/dL (ref 13.0–17.0)
MCH: 28.8 pg (ref 26.0–34.0)
MCHC: 34.5 g/dL (ref 30.0–36.0)
MCV: 83.3 fL (ref 78.0–100.0)
Platelets: 264 10*3/uL (ref 150–400)
RBC: 4.73 MIL/uL (ref 4.22–5.81)
RDW: 13.3 % (ref 11.5–15.5)
WBC: 10.3 10*3/uL (ref 4.0–10.5)

## 2015-03-08 IMAGING — CR DG CHEST 2V
2 series · 2 of 2 positions shown · non-contrast
Comparison: [DATE]; [DATE]; [DATE]

CLINICAL DATA: Shortness of breath since this today morning.

EXAM:
CHEST  2 VIEW

[chest pa]
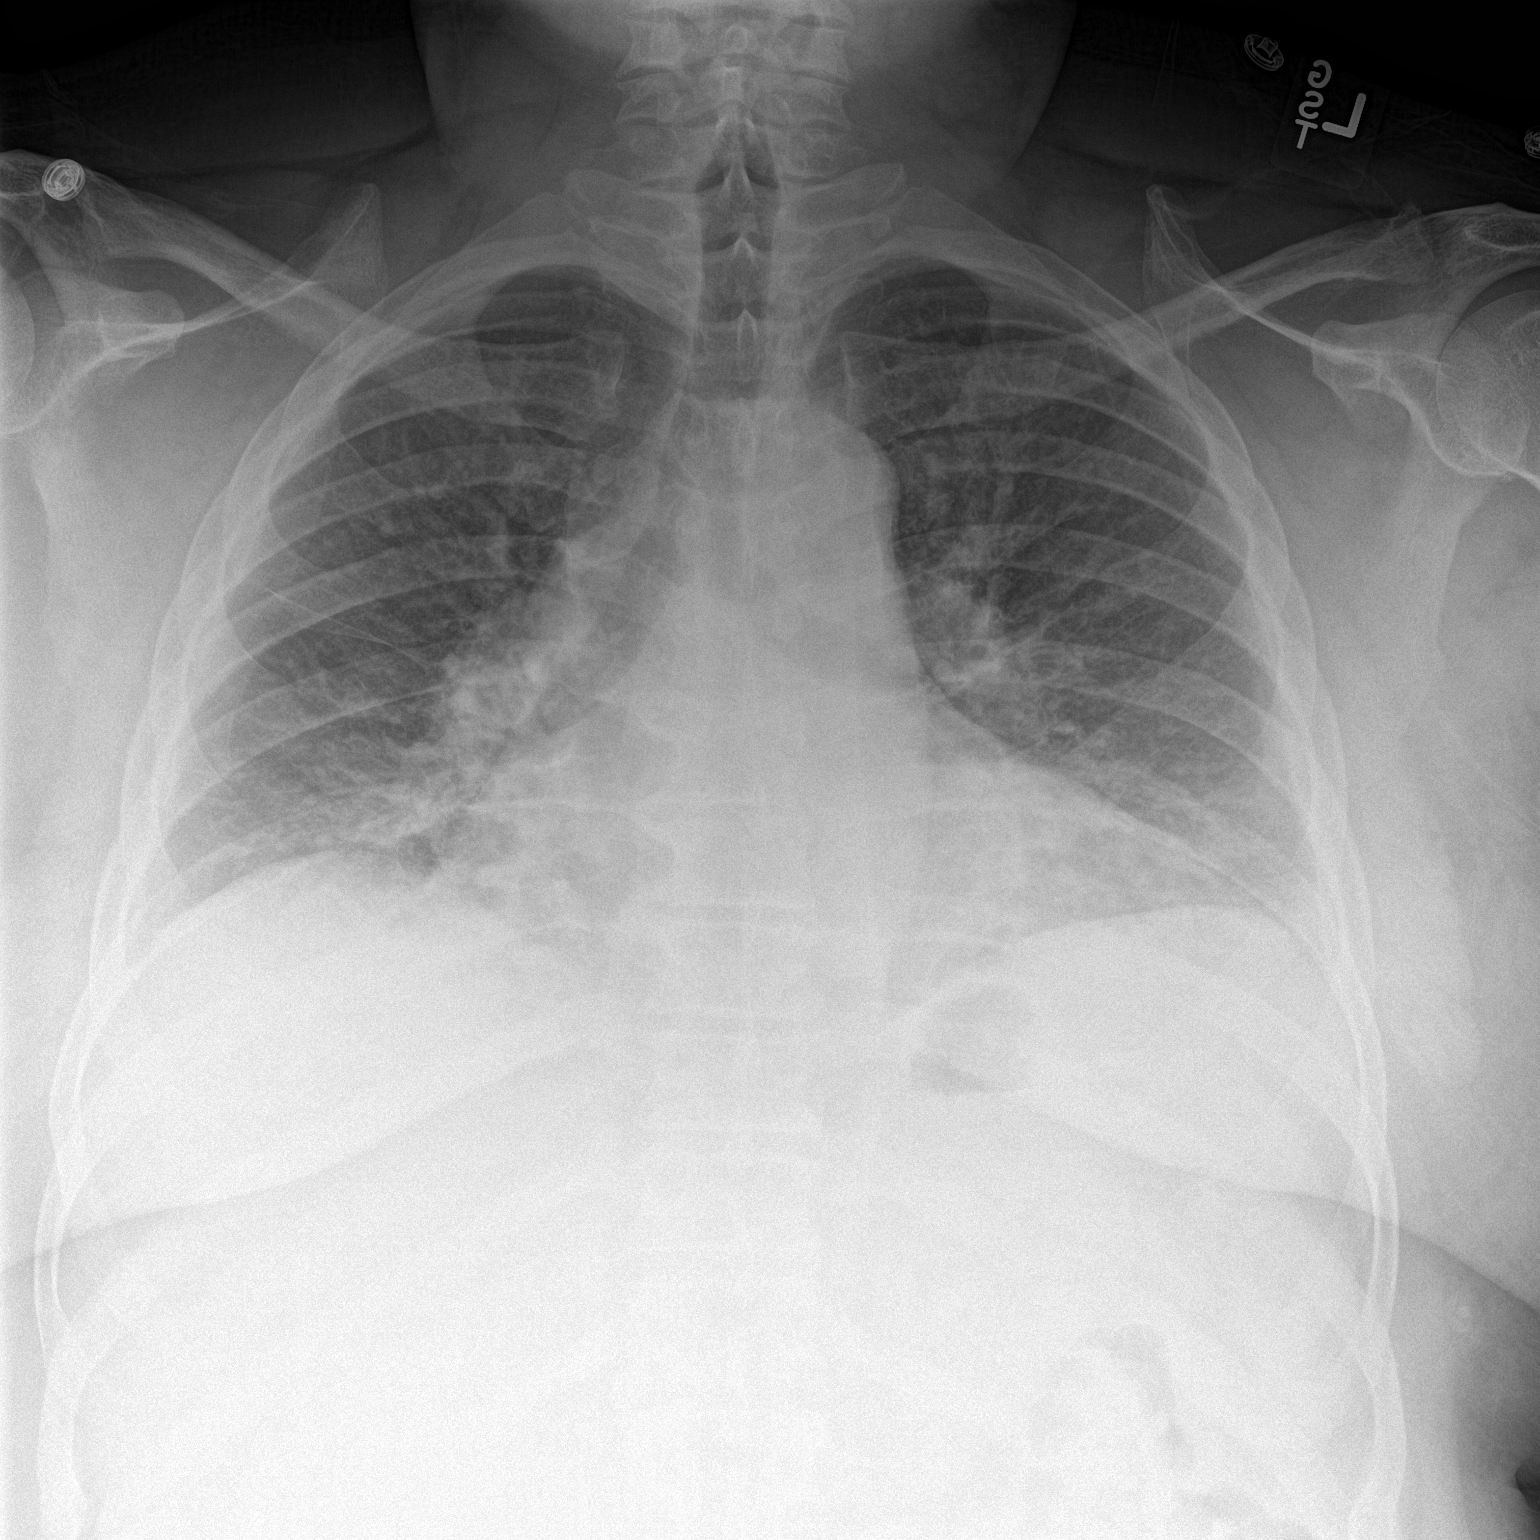

[chest lat]
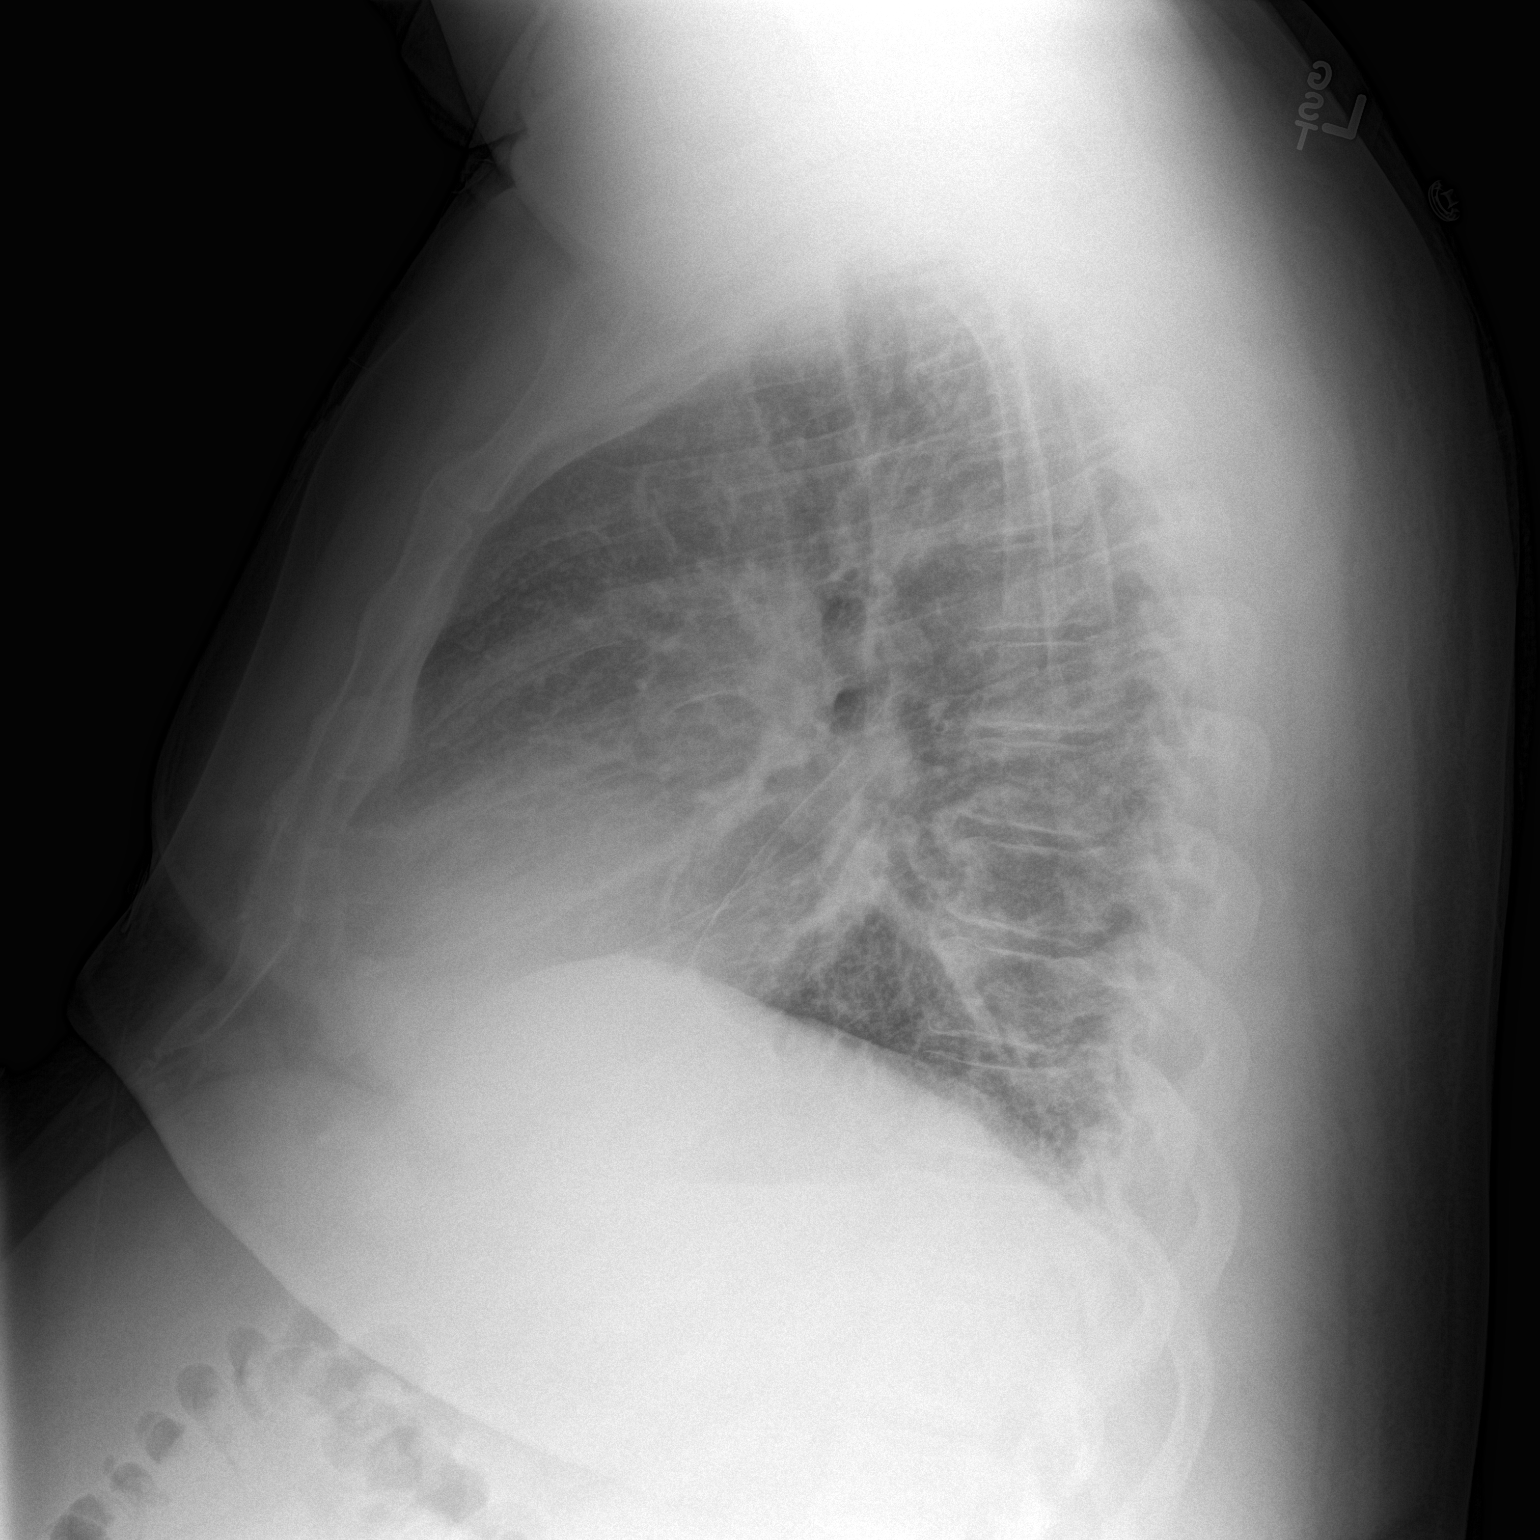

[2 of 2 positions shown; findings below may reference images not displayed]

FINDINGS: Grossly unchanged enlarged cardiac silhouette and mediastinal
contours given persistently reduced lung volumes. Pulmonary
vasculature appears less distinct than present examination with
cephalization of flow. Worsening bibasilar heterogeneous opacities.
No pleural effusion or pneumothorax. No acute osseus abnormalities.
IMPRESSION: Suspected pulmonary edema and bibasilar atelectasis on this
hypoventilated examination. A follow-up chest radiograph in 3 to 4
weeks after treatment is recommended to ensure resolution.

## 2015-03-08 MED ORDER — ACETAMINOPHEN 325 MG PO TABS
650.0000 mg | ORAL_TABLET | Freq: Once | ORAL | Status: AC
  Administered 2015-03-08: 650 mg via ORAL
  Filled 2015-03-08: qty 2

## 2015-03-08 MED ORDER — FUROSEMIDE 10 MG/ML IJ SOLN: 40.0000 mg | INTRAMUSCULAR | Status: DC

## 2015-03-08 NOTE — ED Provider Notes (Signed)
CSN: [id number]     Arrival date & time 03/08/15  0001 History   First MD Initiated Contact with Patient 03/08/15 0100     Chief Complaint  Patient presents with  . Shortness of Breath   (Consider location/radiation/quality/duration/timing/severity/associated sxs/prior Treatment) HPI  [name] is a 44 year old male presenting with report of muscle aches and shortness of breath. He states he woke up this morning with generalized muscle pain. He reports coughing today before with production of white mucus. As the day progressed he noticed he felt more short of breath. He reports subjective fever and chills. He currently denies any pain. He denies chest pain, diaphoresis, nausea, lightheadedness or abdominal pain.   Past Medical History  Diagnosis Date  . Hypertension   . Diabetes mellitus   . Sleep apnea   . Morbid obesity   . History of cocaine use   . History of alcohol abuse   . Paroxysmal atrial fibrillation     Occurring in 2008, with several recurrence since then (including in the setting of + cocaine on UDS).  . Obesity   . Hx of cardiovascular stress test     ETT-Myoview 3/16:  Low risk, no ischemia, EF 58%   Past Surgical History  Procedure Laterality Date  . Appendectomy    . Rotator cuff repair    . Tonsillectomy     Family History  Problem Relation Age of Onset  . Heart attack Father   . Diabetes Mother   . Stroke Maternal Grandmother   . Stroke Paternal Grandmother    History  Substance Use Topics  . Smoking status: Current Every Day Smoker -- 0.10 packs/day for 10 years    Last Attempt to Quit: 10/17/2007  . Smokeless tobacco: Current User    Types: Snuff     Comment: smokes one pack per month  . Alcohol Use: Yes     Comment: occasionally - 1 beer 1x/mont    Review of Systems  Constitutional: Negative for fever and chills.  HENT: Negative for sore throat.   Eyes: Negative for visual disturbance.  Respiratory: Positive for cough and  shortness of breath.   Cardiovascular: Negative for chest pain and leg swelling.  Gastrointestinal: Negative for nausea, vomiting and diarrhea.  Genitourinary: Negative for dysuria.  Musculoskeletal: Positive for myalgias.  Skin: Negative for rash.  Neurological: Negative for weakness, numbness and headaches.    Allergies  Shrimp; Banana; Watermelon flavor; Other; Almond oil; and Sulfa antibiotics  Home Medications   Prior to Admission medications   Medication Sig Start Date End Date Taking? Authorizing Provider  amphetamine-dextroamphetamine (ADDERALL XR) 30 MG 24 hr capsule Take 1 capsule (30 mg total) by mouth daily as needed (For narcolepsy.). 02/25/15   Waymon Budgelinton D Young, MD  atorvastatin (LIPITOR) 10 MG tablet Take 10 mg by mouth at bedtime.     Historical Provider, MD  flecainide (TAMBOCOR) 100 MG tablet Take 1 tablet (100 mg total) by mouth 2 (two) times daily. 12/10/14   Beatrice LecherScott T Weaver, PA-C  fluticasone (FLONASE) 50 MCG/ACT nasal spray 2 sprays each nostril once or twice daily every day while needed 10/29/14   Waymon Budgelinton D Young, MD  glipiZIDE (GLUCOTROL) 10 MG 24 hr tablet Take 10 mg by mouth 2 (two) times daily.  02/28/11   Historical Provider, MD  LANTUS SOLOSTAR 100 UNIT/ML injection Inject 30 Units into the skin at bedtime.  04/22/12   Historical Provider, MD  lisinopril-hydrochlorothiazide (PRINZIDE,ZESTORETIC) 10-12.5 MG per tablet Take 1 tablet  by mouth at bedtime.     Historical Provider, MD  metFORMIN (GLUCOPHAGE) 500 MG tablet Take 1,000 mg by mouth 2 (two) times daily.     Historical Provider, MD  metoprolol succinate (TOPROL XL) 25 MG 24 hr tablet Take 1 tablet (25 mg total) by mouth daily. 12/10/14   Beatrice Lecher, PA-C  rivaroxaban (XARELTO) 20 MG TABS tablet Take 1 tablet (20 mg total) by mouth at bedtime. 12/10/14   Beatrice Lecher, PA-C  traMADol (ULTRAM) 50 MG tablet 1 or 2 every 8 hours if needed for pain 10/29/14   Waymon Budge, MD   BP 118/76 mmHg  Pulse 79   Temp(Src) 98 F (36.7 C) (Oral)  Resp 20  Wt 321 lb 8 oz (145.831 kg)  SpO2 94% Physical Exam  Constitutional: He is oriented to person, place, and time. He appears well-developed and well-nourished. No distress.  HENT:  Head: Normocephalic and atraumatic.  Eyes: Conjunctivae are normal.  Neck: Neck supple.  Cardiovascular: Intact distal pulses.   Pulmonary/Chest: Effort normal and breath sounds normal. No respiratory distress. He has no wheezes. He has no rhonchi. He has no rales. He exhibits no tenderness.  Mildly diminished lung sounds in bases but no rales, rhonchi or wheezes auscultated.  Abdominal: Soft. There is no tenderness.  Musculoskeletal: He exhibits no tenderness.  Lymphadenopathy:    He has no cervical adenopathy.  Neurological: He is alert and oriented to person, place, and time.  Skin: Skin is warm and dry. No rash noted. He is not diaphoretic.  Psychiatric: He has a normal mood and affect.  Nursing note and vitals reviewed.   ED Course  Procedures (including critical care time) Labs Review Labs Reviewed  BASIC METABOLIC PANEL - Abnormal; Notable for the following:    Glucose, Bld 272 (*)    Calcium 8.7 (*)    All other components within normal limits  BRAIN NATRIURETIC PEPTIDE - Abnormal; Notable for the following:    B Natriuretic Peptide 436.9 (*)    All other components within normal limits  CBC  I-STAT TROPOININ, ED  Rosezena Sensor, ED    Imaging Review Dg Chest 2 View  03/08/2015   CLINICAL DATA:  Shortness of breath since this today morning.  EXAM: CHEST  2 VIEW  COMPARISON:  06/22/2014; 11/19/2013; 01/23/2013  FINDINGS: Grossly unchanged enlarged cardiac silhouette and mediastinal contours given persistently reduced lung volumes. Pulmonary vasculature appears less distinct than present examination with cephalization of flow. Worsening bibasilar heterogeneous opacities. No pleural effusion or pneumothorax. No acute osseus abnormalities.  IMPRESSION:  Suspected pulmonary edema and bibasilar atelectasis on this hypoventilated examination. A follow-up chest radiograph in 3 to 4 weeks after treatment is recommended to ensure resolution.   Electronically Signed   By: Simonne Come [name]   On: 03/08/2015 01:14     EKG Interpretation   Date/Time:  Monday Mar 08 2015 03:20:04 EDT Ventricular Rate:  93 PR Interval:    QRS Duration: 78 QT Interval:  370 QTC Calculation: 460 R Axis:   2 Text Interpretation:  Age not entered, assumed to be  44 years old for  purpose of ECG interpretation Atrial fibrillation Anteroseptal infarct,  old No significant change since last tracing Confirmed by WARD,  DO,  KRISTEN ([id number]) on 03/08/2015 3:42:37 AM      MDM   Final diagnoses:  URI (upper respiratory infection)   44 yo with muscle aches, shortness of breath and cough.  His initial EKG shows  afib which is established at a rate in the low 100s.  His repeat EKG after observation in the ED shows rate of 93. He has 2 negative troponins and currently symptoms free.  Symptoms most likely related to viral illness as he has recent provocative cardiac testing in March of this year that determines his risk as low. His chest x-ray is negative for pneumonia and only suggestive of pleural effusion, but not ascultated on lung exam.  Discussed case with Dr. Elesa Massed. Despite nursing documentation, I observed his O2 sats 96 % on room air at rest and 94% on room air with ambulation. Pt is well-appearing, in no acute distress and vital signs reviewed and not concerning. He appears safe to be discharged.  Discharge include follow-up with their PCP and cardiologist. Return precautions provided. Pt aware of plan and in agreement.   Filed Vitals:   03/08/15 0300 03/08/15 0316 03/08/15 0400 03/08/15 0415  BP: 132/86 135/88 124/92   Pulse:  93 96 91  Temp:      TempSrc:      Resp: Weight:      SpO2:  92%  92%   Meds given in ED:  Medications  acetaminophen  (TYLENOL) tablet 650 mg (650 mg Oral Given 03/08/15 0358)    Discharge Medication List as of 03/08/2015  4:27 AM         Harle Battiest, NP 03/09/15 [phone]

## 2015-03-08 NOTE — ED Provider Notes (Signed)
Medical screening examination/treatment/procedure(s) were conducted as a shared visit with non-physician practitioner(s) and myself.  I personally evaluated the patient during the encounter.   EKG Interpretation   Date/Time:  Monday Mar 08 2015 00:12:32 EDT Ventricular Rate:  105 PR Interval:    QRS Duration: 80 QT Interval:  352 QTC Calculation: 465 R Axis:   -18 Text Interpretation:  Atrial fibrillation with rapid ventricular response  Septal infarct , age undetermined Abnormal ECG Confirmed by WARD,  DO,  KRISTEN [phone]([id number]) on 03/08/2015 1:25:44 AM       EKG Interpretation  Date/Time:  Monday Mar 08 2015 03:20:04 EDT Ventricular Rate:  93 PR Interval:    QRS Duration: 78 QT Interval:  370 QTC Calculation: 460 R Axis:   2 Text Interpretation:  Age not entered, assumed to be  44 years old for purpose of ECG interpretation Atrial fibrillation Anteroseptal infarct, old No significant change since last tracing Confirmed by WARD,  DO, KRISTEN ([id number]) on 03/08/2015 3:42:37 AM        Pt is a 44 y.o. male with history of A. fib on Xarelto, hypertension, diabetes who presents to the emergency department with several days of cough with yellow sputum production, body aches, chills. States he does feel some shortness of breath with exertion but no chest pain. Lungs are clear to auscultation with good aeration. No hypoxia. Able to ambulate without hypoxia. EKG showed a trivial fibrillation with no new ischemic changes. Troponin 2 negative. Patient has recently undergone nuclear medicine stress test that was low risk with EF of 58%. Suspect viral URI causing his symptoms. I feel he is safe to be discharged home with close outpatient follow-up.  Layla MawKristen N Ward, DO 03/08/15 [phone]

## 2015-03-08 NOTE — ED Notes (Signed)
Patient transported to X-ray 

## 2015-03-08 NOTE — Discharge Instructions (Signed)
Please follow the directions provided.  Be sure to follow-up with your cardiologist this week to make sure you are getting better. Take tylenol or ibuprofen for discomfort.  Rest tomorrow and slowly resume normal activities as you feel better.   SEEK IMMEDIATE MEDICAL CARE IF:  You have a fever.  You develop severe or persistent headache, ear pain, sinus pain, or chest pain.  You develop wheezing, a prolonged cough, cough up blood, or have a change in your usual mucus (if you have chronic lung disease).  You develop sore muscles or a stiff neck.

## 2015-03-08 NOTE — ED Notes (Signed)
C/o sob, generalized body aches, and chills since waking up Sunday morning.  Denies pain at present.

## 2015-03-08 NOTE — ED Notes (Signed)
Pt. Maintained 94/95% while ambulating. Pt states no sob/cp/tightness/dizziness while ambulating as well. 0/0 pain.

## 2015-03-10 ENCOUNTER — Ambulatory Visit (INDEPENDENT_AMBULATORY_CARE_PROVIDER_SITE_OTHER): Payer: 59 | Admitting: Internal Medicine

## 2015-03-10 ENCOUNTER — Encounter: Payer: Self-pay | Admitting: Internal Medicine

## 2015-03-10 VITALS — BP 140/78 | HR 85 | Ht 69.0 in | Wt 316.0 lb

## 2015-03-10 DIAGNOSIS — G4733 Obstructive sleep apnea (adult) (pediatric): Secondary | ICD-10-CM

## 2015-03-10 DIAGNOSIS — G47411 Narcolepsy with cataplexy: Secondary | ICD-10-CM | POA: Diagnosis not present

## 2015-03-10 NOTE — Patient Instructions (Addendum)
Order- [facility]- increase CPAP to 15. Patient needs help with mask fit- mask of choice. Current mask isn't working well.    Needs to be shown how to adjust RAMP and Humidifier  Try otc Biotene mouth rinse at bedtime and as needed for dry mouth  You can call us the name of the sleep med you took before.

## 2015-03-10 NOTE — Progress Notes (Signed)
01/25/12- [age] yoM former smoker comes to reestablish for management of obstructive sleep apnea complicated by history of allergic rhinitis and atrial fibrillation, diabetes, HTN. PCP- Dr Trula Sladeon Pol[facility] NPSG 11/23/00- AHI 20 per hour. He continues using CPAP all night every night. 14 CWP/[employer]. Using a fullface mask but putting it on so only covers his nose,  otherwise it smothers him. Fighting daytime sleepiness which affects his work. Hypnagogic hallucination associated with dreaming. 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 Vivid dreams as soon as he falls asleep. Wakes irritable from naps. Bedtime 9:30 PM, very brief sleep latency, wakes 4 times for nocturia before up at 4:30 AM. No sleeping pill. 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  Quit smoking 2009, 1 beer/ month, little caffeine. Unmarried, living w/ sister, has girlfriend Fam hx- Father hx MI, Mother dcs'd DM  05/09/12- [age] yoM former smoker comes to reestablish for management of obstructive sleep apnea complicated by history of allergic rhinitis and atrial fibrillation, diabetes, HTN. PCP- Dr Trula Sladeon [facility] He returns to followup and reports use of CPAP 13/ [employer] most nights, all night " a whole 'nother world". He sleeps soundly and now with CPAP he is sleeping through his morning alarm clock which has caused him to miss work. Bedtime is 9:30 PM and he is trying to get up at 5:15 AM. Still drowsy after meals and if sitting quietly. He also has an urge to sleep if he gets angry and at that time recognizes dreaming. He does not specifically describe muscle weakness with these episodes. He gives a possible history of sleep paralysis.  10/21/12- 41 yoM former smoker followed for management of obstructive sleep apnea/ hypersomnia complicated by history of allergic rhinitis and atrial fibrillation, diabetes, HTN. PCP- Dr Trula Sladeon [facility] FOLLOWS FOR: using CPAP/ 13/ [employer] every night for 5 hours per night. denies any  problems with the machine or the mask. He is still fighting daytime sleepiness and falling asleep at work. He struggles with this because he has to drive. Strong emotion makes him feel weak so that he needs to sit down. He recognizes sleep paralysis as an occasional event as well as hypnagogic hallucination. Caffeine has little daytime effect. 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 Incidental request for referral to an eye doctor because of his diabetes. He would like to go back to Dr. Luciana Axeankin who had seen him in the past. Rhinitis is improved by Flonase, asks refill.  01/14/13-41 yoM former smoker followed for management of obstructive sleep apnea/ hypersomnia complicated by history of allergic rhinitis and atrial fibrillation, diabetes, HTN. PCP- Dr Trula Sladeon [facility] FOLLOWS FOR: Wears CPAP 13/ [employer] every night for about 6-8 hours; pressure working well for patient.  He still can get drowsy if he sits quietly. Caffeine has no effect. Nuvigil was no help. Multiple Sleep Latency Test 10/30/2012-pathologic daytime hypersomnia, nonspecific, compatible with idiopathic hypersomnia or narcolepsy. Mean latency 0.9 minutes with one sleep onset REM event. He has been taken off of driving at work, he says because of an issue with his blood sugar being too high.  02/06/13- 41 yoM former smoker followed for management of obstructive sleep apnea and hypersomnia/ narcolepsy with cataplexy complicated by history of allergic rhinitis and atrial fibrillation, diabetes, HTN. PCP- Dr Trula Sladeon [facility] FOLLOWS FOR: wears CPAP every night for about 7 hours;pressure doing well. Recent hospital stay Hospital twice for AFib/ CHF. Also question of pneumonia. We discussed  potential for methylphenidate to precipitate atrial fibrillation which was a chronic diagnosis for him. He thinks it may be more anxious in the morning, not necessarily more alert. 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 He now describes waking with sleep paralysis and recognizes my description of hypnagogic hallucination. If startled he gets an uncomfortable weak aching feeling in the back of his neck, his knees get weak, sounds like cataplexy. I had not gotten is clear description before.  03/03/13- 27 yoM former smoker followed for management of obstructive sleep apnea and hypersomnia/ narcolepsy with cataplexy complicated by history of allergic rhinitis and atrial fibrillation, diabetes, HTN. PCP- Dr Trula Slade 1 month follow up.  Wearing cpap 13/ [employer] everynight for approx 7 hours.  No problems with mask or pressure, he does follow some air. Still having daytime sleepimess.  Adderall helps him feel alert but lasts only 2 hours, and he still does. 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 Pressure reduced to 11 due to gas pains.   05/05/13- 41 yoM former smoker followed for management of obstructive sleep apnea and hypersomnia/ narcolepsy with cataplexy complicated by history of allergic rhinitis and atrial fibrillation, diabetes, HTN. PCP- Dr Trula Slade FOLLOWS FOR: pt reports wearing CPAP11/ [employer] machine everynight x 7 hrs per night -- pt reports pressure setting is comfortable no supplies needed-- denies any other concerns at this time Continues Dexedrine, one at 5 AM and one at lunch time. Any later in the day interferes with sleep at night. 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  11/14/13- 41 yoM former smoker followed for management of obstructive sleep apnea and hypersomnia/ narcolepsy with cataplexy complicated by history of allergic rhinitis and atrial fibrillation, diabetes, HTN. PCP- Dr Trula Slade CPAP11/ [facility] all night every night Working for his [employer] when he can.  Signed for total disability as of 01/17/2013. Sleep is about the same. He paces  himself during the day. Stopped Dexedrine. CXR 01/23/13 IMPRESSION:  Cardiomegaly prominent perihilar markings consistent with  bronchitis and possibly mild pulmonary vascular congestion.  Consider follow-up.  Original Report Authenticated By: Dwyane Dee, [name]  05/14/14- 41 yoM former smoker followed for management of obstructive sleep apnea and hypersomnia/ narcolepsy with cataplexy complicated by history of allergic rhinitis and atrial fibrillation, diabetes, HTN. PCP- Dr Trula Slade FOLLOWS FOR: pt states he wishes to change from adderall to another medication.  Missed appt with [facility] last week to get a new cpap 11 (was [employer]- now ? [facility]), hasn't rescheduled yet .  Wears cpap "off and on" X few hours per night.   Preferred Dexedrine over Adderall, but too expensive.frequent cataplexy Now has lawyer helping him with disability application CXR 11/19/13 IMPRESSION:  1. Mild cardiomegaly. Otherwise, no significant abnormalities are  observed.  Electronically Signed  By: Herbie Baltimore [name]  On: 11/19/2013 18:24  10/29/14- 22 yoM former smoker followed for management of obstructive sleep apnea and hypersomnia/ narcolepsy with cataplexy complicated by history of allergic rhinitis and atrial fibrillation, dCHF, diabetes, HTN. PCP- Dr Trula Slade FOLLOWS FOR:not wearing CPAP 13/ [facility] as he should Download documents inadequate use but satisfactory control. Says bad toothache made worse by airflow from CPAP and nasal congestion worse some nights where he takes mask off. Okay on Adderall XR 30 mg 1 daily. Denies any cocaine use any longer.  03/10/15- 3  yoM  smoker followed for management of obstructive sleep apnea and hypersomnia/ narcolepsy with cataplexy complicated by history of allergic rhinitis and atrial fibrillation, dCHF, diabetes, HTN. PCP- Dr Ferne Reus Polite     CPAP 13/ [facility]  Normal Card stress test in March FOLLOWS FOR: pt not using CPAP as long as he should; pt is having alot of  trouble with type of mask; DME is [facility]. Also, patient feels like pressure is not enough. Mask won't seal CXR 03/08/15 IMPRESSION: Suspected pulmonary edema and bibasilar atelectasis on this hypoventilated examination. A follow-up chest radiograph in 3 to 4 weeks after treatment is recommended to ensure resolution. Electronically Signed  By: Simonne Come [name]  On: 03/08/2015 01:14  ROS-see HPI Constitutional:   No-   weight loss, night sweats, fevers, chills,  +fatigue, lassitude. HEENT:   No-  headaches, difficulty swallowing, tooth/dental problems, sore throat,       No-  sneezing, itching, ear ache, nasal congestion, post nasal drip,  CV:  No-   chest pain, orthopnea, PND, swelling in lower extremities, anasarca, dizziness, palpitations Resp: No-   shortness of breath with exertion or at rest.              No-   productive cough,  No non-productive cough,  No- coughing up of blood.              No-   change in color of mucus.  No- wheezing.   Skin: No-   rash or lesions. GI:  No-   heartburn, indigestion, abdominal pain, nausea, vomiting,  GU:  MS:  No-   joint pain or swelling.  Neuro-     nothing unusual Psych:  No- change in mood or affect. No depression or anxiety.  No memory loss.  OBJ- Physical Exam   General- Alert/ calm, Oriented, Affect-appropriate, Distress- none acute, +obese,  Skin- rash-none, lesions- none, excoriation- none Lymphadenopathy- none Head- atraumatic            Eyes- Gross vision intact, PERRLA, conjunctivae and secretions clear            Ears- Hearing, canals-normal            Nose- rhinitis/ turbinate edema, no-Septal dev, mucus, polyps, erosion, perforation             Throat- Mallampati III , mucosa clear , drainage- none, tonsils- atrophic Neck- flexible , trachea midline, no stridor , thyroid nl, carotid no bruit Chest - symmetrical excursion , unlabored           Heart/CV- RRR , no murmur , no gallop  , no rub, nl s1 s2                            - JVD- none , edema- none, stasis changes- none, varices- none           Lung- clear to P&A, wheeze- none, cough- none , dullness-none, rub- none           Chest wall-  Abd-  Br/ Gen/ Rectal- Not done, not indicated Extrem- cyanosis- none, clubbing, none, atrophy- none, strength- nl

## 2015-03-13 NOTE — Assessment & Plan Note (Signed)
Typical of narcoleptic's, he is sleepy when he wants to be awake and awake when he wants to be a sleep Plan-he will call us with the name of a sleep medicine which worked for him before. Emphasis on good sleep hygiene and regular schedule. Daytime naps if needed, as discussed.

## 2015-03-13 NOTE — Assessment & Plan Note (Signed)
He needs enough attention from his DME company to get proper mask fit. He would like to try a higher pressure but the download we have indicates 13 doesn't good job. He just hasn't been wearing it enough. Plan-we'll try to get the DME company more closely involved

## 2015-03-26 ENCOUNTER — Telehealth: Payer: Self-pay | Admitting: Internal Medicine

## 2015-03-26 MED ORDER — AMPHETAMINE-DEXTROAMPHET ER 30 MG PO CP24: 30.0000 mg | ORAL_CAPSULE | Freq: Every day | ORAL | Status: DC | PRN

## 2015-03-26 NOTE — Telephone Encounter (Signed)
Ok to refill 

## 2015-03-26 NOTE — Telephone Encounter (Signed)
Pt requesting refill of Adderall 30mg  XR (1 tab QD). Last filled on 02/25/15 with #30, 0 refills. Last OV on 03/10/2015.   CY please advise.   Allergies  Allergen Reactions  . Shrimp [Shellfish Allergy] Anaphylaxis  . Banana Other (See Comments)    Pt gags  . Watermelon Flavor Nausea And Vomiting  . Other Itching    Grapes  . Almond Oil Itching    Roof of mouth itches  . Sulfa Antibiotics Itching    Current Outpatient Prescriptions on File Prior to Visit  Medication Sig Dispense Refill  . amphetamine-dextroamphetamine (ADDERALL XR) 30 MG 24 hr capsule Take 1 capsule (30 mg total) by mouth daily as needed (For narcolepsy.). 30 capsule 0  . atorvastatin (LIPITOR) 10 MG tablet Take 10 mg by mouth at bedtime.     . flecainide (TAMBOCOR) 100 MG tablet Take 1 tablet (100 mg total) by mouth 2 (two) times daily. 60 tablet 6  . fluticasone (FLONASE) 50 MCG/ACT nasal spray 2 sprays each nostril once or twice daily every day while needed 16 g prn  . glipiZIDE (GLUCOTROL) 10 MG 24 hr tablet Take 10 mg by mouth 2 (two) times daily.     Marland Kitchen LANTUS SOLOSTAR 100 UNIT/ML injection Inject 20 Units into the skin at bedtime.     Marland Kitchen lisinopril-hydrochlorothiazide (PRINZIDE,ZESTORETIC) 10-12.5 MG per tablet Take 1 tablet by mouth at bedtime.     . metFORMIN (GLUCOPHAGE) 500 MG tablet Take 1,000 mg by mouth 2 (two) times daily.     . metoprolol succinate (TOPROL XL) 25 MG 24 hr tablet Take 1 tablet (25 mg total) by mouth daily. 30 tablet 6  . rivaroxaban (XARELTO) 20 MG TABS tablet Take 1 tablet (20 mg total) by mouth at bedtime. 30 tablet 6  . traMADol (ULTRAM) 50 MG tablet 1 or 2 every 8 hours if needed for pain 50 tablet 0   No current facility-administered medications on file prior to visit.

## 2015-03-26 NOTE — Telephone Encounter (Signed)
Called and spoke to pt. Signed rx placed up front for pick up. Pt aware.

## 2015-04-23 ENCOUNTER — Telehealth: Payer: Self-pay | Admitting: Internal Medicine

## 2015-04-23 MED ORDER — AMPHETAMINE-DEXTROAMPHET ER 30 MG PO CP24: 30.0000 mg | ORAL_CAPSULE | Freq: Every day | ORAL | Status: DC | PRN

## 2015-04-23 NOTE — Telephone Encounter (Signed)
Spoke with pt. He is requesting refill on ADDERALL XR 30 MG 24 hr capsule. He will pick up. Last refilled 03/26/15 #30 x 0 refills. Please advise Dr. Maple HudsonYoung thanks

## 2015-04-23 NOTE — Telephone Encounter (Signed)
RX has been printed and placed on [facility] cart for signature. Will forward to [place] to f/u on once mailed to pt. thanks

## 2015-04-23 NOTE — Telephone Encounter (Signed)
Rx has been signed by CY and placed in outgoing mail by [place].

## 2015-04-23 NOTE — Telephone Encounter (Signed)
After speaking with Florentina AddisonKatie, pt was never notified of RX being signed and placed in the mail.  Triage has called this patient and made him aware.  Katie aware that we have spoken with patient.  Nothing further needed.

## 2015-04-23 NOTE — Telephone Encounter (Signed)
Ok to refill 

## 2015-04-29 ENCOUNTER — Telehealth: Payer: Self-pay | Admitting: Internal Medicine

## 2015-04-29 MED ORDER — AMPHETAMINE-DEXTROAMPHET ER 30 MG PO CP24: 30.0000 mg | ORAL_CAPSULE | Freq: Every day | ORAL | Status: DC | PRN

## 2015-04-29 NOTE — Telephone Encounter (Signed)
lmomtcb x1 

## 2015-04-29 NOTE — Telephone Encounter (Signed)
RX printed, signed and placed for pick up. Pt aware. Confirmed mailing address as well

## 2015-04-29 NOTE — Telephone Encounter (Signed)
Ok to re-print for him to pick up Please double check mailing address

## 2015-04-29 NOTE — Telephone Encounter (Signed)
Pt called back and reports he checked his mail and still have not received RX yet. He is out of medication and is wanting to pick up RX. Please advise Dr. Maple HudsonYoung thanks

## 2015-04-29 NOTE — Telephone Encounter (Signed)
Patient called in and was notified that RX for Adderall was mailed.  He has not recd it but will call back after he checks mail today if still not recd.

## 2015-06-02 ENCOUNTER — Telehealth: Payer: Self-pay | Admitting: Internal Medicine

## 2015-06-02 NOTE — Telephone Encounter (Signed)
Ok to refill as requested 

## 2015-06-02 NOTE — Telephone Encounter (Signed)
Pt needs refill on Adderall XR . Last OV was on 03/10/15. Has pending OV on 09/10/15. Last refill was on 04/29/15 >> Adderall XR  1 q day #30.  CY - please advise on refill. Thanks.

## 2015-06-02 NOTE — Telephone Encounter (Signed)
lmtcb X1 for pt.  Need to know if he wants to pick this up or have it mailed to him.  Will refill rx after speaking to patient.

## 2015-06-03 MED ORDER — AMPHETAMINE-DEXTROAMPHET ER 30 MG PO CP24: 30.0000 mg | ORAL_CAPSULE | Freq: Every day | ORAL | Status: DC | PRN

## 2015-06-03 NOTE — Telephone Encounter (Signed)
Patient called back and states he will come and pick up the RX.  Please call him when this is ready.  [phone].

## 2015-06-03 NOTE — Telephone Encounter (Signed)
Pt aware that rx has been placed up front for pick up Nothing further needed.

## 2015-06-03 NOTE — Telephone Encounter (Signed)
Please let patient know that Rx has been placed upfront for pick up. Thanks.

## 2015-06-03 NOTE — Telephone Encounter (Signed)
Attempted to call pt. Someone picked up the line but no one answered. Will try back.

## 2015-06-03 NOTE — Telephone Encounter (Signed)
RX placed on [facility] cart. Will forward to [place] to f/u on

## 2015-06-24 ENCOUNTER — Ambulatory Visit: Payer: Self-pay | Admitting: Interventional Cardiology

## 2015-07-02 ENCOUNTER — Telehealth: Payer: Self-pay | Admitting: Internal Medicine

## 2015-07-02 MED ORDER — AMPHETAMINE-DEXTROAMPHET ER 30 MG PO CP24: 30.0000 mg | ORAL_CAPSULE | Freq: Every day | ORAL | Status: DC | PRN

## 2015-07-02 NOTE — Telephone Encounter (Signed)
Pt returned call. Notified him Rx ready for pickup. Nothing else needed.

## 2015-07-02 NOTE — Telephone Encounter (Signed)
Ok to refill 

## 2015-07-02 NOTE — Telephone Encounter (Signed)
Pt is requesting to pick up RX for adderall 30 mg Take 1 capsule (30 mg total) by mouth daily as needed (For narcolepsy.). Last refilled 06/03/15 #30 x 0 refills Please advise Dr. Maple Hudson thanks

## 2015-07-02 NOTE — Telephone Encounter (Signed)
lmomtcb x1 for pt RX left for pick up

## 2015-08-02 ENCOUNTER — Telehealth: Payer: Self-pay | Admitting: Internal Medicine

## 2015-08-02 NOTE — Telephone Encounter (Signed)
Spoke with pt. Needs refill on Adderall. He is aware that CY is out of the office until Wednesday.  Last OV 03/10/15 Pending OV 09/10/15 Last refill 07/02/15  CY - please advise on refill. Thanks.

## 2015-08-04 MED ORDER — AMPHETAMINE-DEXTROAMPHET ER 30 MG PO CP24: 30.0000 mg | ORAL_CAPSULE | Freq: Every day | ORAL | Status: DC | PRN

## 2015-08-04 NOTE — Telephone Encounter (Signed)
rx printed, signed, and placed up front for pt pick up. lmtcb X1 for pt to make aware.

## 2015-08-04 NOTE — Telephone Encounter (Signed)
Ok to refill as before 

## 2015-08-05 NOTE — Telephone Encounter (Signed)
Pt calling stating that he picked up rx on yesterday, says he was called this morning and was wondering what the message was in reference to this morning, please advise.Caren GriffinsStanley A Dalton

## 2015-08-05 NOTE — Telephone Encounter (Signed)
lmtcb X2 for pt.  

## 2015-08-05 NOTE — Telephone Encounter (Signed)
Phone calls were to make pt aware of rx refill being placed up front for pt.   Left detailed message for pt explaining this is the purpose of the call.   Nothing further needed.

## 2015-08-13 ENCOUNTER — Ambulatory Visit: Payer: Self-pay | Admitting: Interventional Cardiology

## 2015-08-27 ENCOUNTER — Telehealth: Payer: Self-pay | Admitting: Internal Medicine

## 2015-08-27 NOTE — Telephone Encounter (Signed)
Opened message in error.  Closed.

## 2015-09-03 ENCOUNTER — Telehealth: Payer: Self-pay | Admitting: Interventional Cardiology

## 2015-09-03 NOTE — Telephone Encounter (Signed)
Called pt to inform him that I was leaving samples of Xarelto 20 mg tablets, enough to last a month and if he has any other problems, questions or concerns to call the office. Pt verbalized understanding.

## 2015-09-03 NOTE — Telephone Encounter (Signed)
New message  ° ° ° °Patient calling the office for samples of medication: ° ° °1.  What medication and dosage are you requesting samples for? xarelto 20  ° °2.  Are you currently out of this medication?  yes ° °  °

## 2015-09-07 ENCOUNTER — Telehealth: Payer: Self-pay | Admitting: Internal Medicine

## 2015-09-07 NOTE — Telephone Encounter (Signed)
Left message to call back  

## 2015-09-08 MED ORDER — AMPHETAMINE-DEXTROAMPHET ER 30 MG PO CP24: 30.0000 mg | ORAL_CAPSULE | Freq: Every day | ORAL | Status: DC | PRN

## 2015-09-08 NOTE — Telephone Encounter (Signed)
Ok to refill 

## 2015-09-08 NOTE — Telephone Encounter (Signed)
Pt is requesting adderall 30 mg refill       Sig: Take 1 capsule (30 mg total) by mouth daily as needed (For narcolepsy.).       #30 x 0 refills on 08/04/15.  Please advise Dr. Maple HudsonYoung thanks

## 2015-09-08 NOTE — Telephone Encounter (Signed)
Called and spoke to pt. Informed him of the refill. Rx placed on CY's cart for signature. Pt has appt with CY on 11/25 and will pick up rx then. Will forward to [facility] to follow.

## 2015-09-10 ENCOUNTER — Ambulatory Visit (INDEPENDENT_AMBULATORY_CARE_PROVIDER_SITE_OTHER): Payer: 59 | Admitting: Internal Medicine

## 2015-09-10 ENCOUNTER — Encounter: Payer: Self-pay | Admitting: Internal Medicine

## 2015-09-10 VITALS — BP 148/92 | HR 94 | Ht 69.0 in | Wt 319.0 lb

## 2015-09-10 DIAGNOSIS — G4733 Obstructive sleep apnea (adult) (pediatric): Secondary | ICD-10-CM

## 2015-09-10 DIAGNOSIS — J3089 Other allergic rhinitis: Secondary | ICD-10-CM

## 2015-09-10 DIAGNOSIS — Z23 Encounter for immunization: Secondary | ICD-10-CM | POA: Diagnosis not present

## 2015-09-10 DIAGNOSIS — J310 Chronic rhinitis: Secondary | ICD-10-CM

## 2015-09-10 DIAGNOSIS — J309 Allergic rhinitis, unspecified: Secondary | ICD-10-CM

## 2015-09-10 DIAGNOSIS — J302 Other seasonal allergic rhinitis: Secondary | ICD-10-CM

## 2015-09-10 MED ORDER — AZELASTINE-FLUTICASONE 137-50 MCG/ACT NA SUSP
2.0000 | Freq: Every day | NASAL | Status: DC
Start: 2015-09-10 — End: 2016-04-07

## 2015-09-10 NOTE — Assessment & Plan Note (Signed)
Noncompliant. Blames "smothered" but he has never been reliably compliant. Plan-education done. Change CPAP from fixed pressure 13 to auto. Consider BiPAP. Emphasize weight loss and sleep hygiene.

## 2015-09-10 NOTE — Patient Instructions (Addendum)
Sample Dymista nasal spray   1-2 puffs each nostril once daily at bedtime  Order- referral ENT Dr Narda Bondshris Newman      Chronic rhinitis  Flu vax  Order- DME [facility]  Change CPAP to auto 8- 15     Continue maskof choice, humidifier, supplies, add AirView   Dx OSA  Ok to continue current meds

## 2015-09-10 NOTE — Telephone Encounter (Signed)
Rx was signed and give to patient at appt this morning. Nothing more needed at this time.

## 2015-09-10 NOTE — Assessment & Plan Note (Signed)
Not making lifestyle changes likely to contribute to weight loss. Encouraged.

## 2015-09-10 NOTE — Progress Notes (Signed)
01/25/12- [age] yoM former smoker comes to reestablish for management of obstructive sleep apnea complicated by history of allergic rhinitis and atrial fibrillation, diabetes, HTN. PCP- Dr Trula Sladeon Polite NPSG 11/23/00- AHI 20 per hour. He continues using CPAP all night every night. 14 CWP/[employer]. Using a fullface mask but putting it on so only covers his nose,  otherwise it smothers him. Fighting daytime sleepiness which affects his work. Hypnagogic hallucination associated with dreaming. 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 Vivid dreams as soon as he falls asleep. Wakes irritable from naps. Bedtime 9:30 PM, very brief sleep latency, wakes 4 times for nocturia before up at 4:30 AM. No sleeping pill. 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  Quit smoking 2009, 1 beer/ month, little caffeine. Unmarried, living w/ sister, has girlfriend Fam hx- Father hx MI, Mother dcs'd DM  05/09/12- [age] yoM former smoker comes to reestablish for management of obstructive sleep apnea complicated by history of allergic rhinitis and atrial fibrillation, diabetes, HTN. PCP- Dr Trula Sladeon Polite He returns to followup and reports use of CPAP 13/ [employer] most nights, all night " a whole 'nother world". He sleeps soundly and now with CPAP he is sleeping through his morning alarm clock which has caused him to miss work. Bedtime is 9:30 PM and he is trying to get up at 5:15 AM. Still drowsy after meals and if sitting quietly. He also has an urge to sleep if he gets angry and at that time recognizes dreaming. He does not specifically describe muscle weakness with these episodes. He gives a possible history of sleep paralysis.  10/21/12- 44 yoM former smoker followed for management of obstructive sleep apnea/ hypersomnia complicated by history of allergic rhinitis and atrial fibrillation, diabetes, HTN. PCP- Dr Trula Sladeon Polite FOLLOWS FOR: using CPAP/ 13/ [employer] every night for 5 hours per night. denies any  problems with the machine or the mask. He is still fighting daytime sleepiness and falling asleep at work. He struggles with this because he has to drive. Strong emotion makes him feel weak so that he needs to sit down. He recognizes sleep paralysis as an occasional event as well as hypnagogic hallucination. Caffeine has little daytime effect. 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 Incidental request for referral to an eye doctor because of his diabetes. He would like to go back to Dr. Luciana Axeankin who had seen him in the past. Rhinitis is improved by Flonase, asks refill.  01/14/13-44 yoM former smoker followed for management of obstructive sleep apnea/ hypersomnia complicated by history of allergic rhinitis and atrial fibrillation, diabetes, HTN. PCP- Dr Trula Sladeon Polite FOLLOWS FOR: Wears CPAP 13/ [employer] every night for about 6-8 hours; pressure working well for patient.  He still can get drowsy if he sits quietly. Caffeine has no effect. Nuvigil was no help. Multiple Sleep Latency Test 10/30/2012-pathologic daytime hypersomnia, nonspecific, compatible with idiopathic hypersomnia or narcolepsy. Mean latency 0.9 minutes with one sleep onset REM event. He has been taken off of driving at work, he says because of an issue with his blood sugar being too high.  02/06/13- 44 yoM former smoker followed for management of obstructive sleep apnea and hypersomnia/ narcolepsy with cataplexy complicated by history of allergic rhinitis and atrial fibrillation, diabetes, HTN. PCP- Dr Trula Sladeon Polite FOLLOWS FOR: wears CPAP every night for about 7 hours;pressure doing well. Recent hospital stay Hospital twice for AFib/ CHF. Also question of pneumonia. We discussed  potential for methylphenidate to precipitate atrial fibrillation which was a chronic diagnosis for him. He thinks it may be more anxious in the morning, not necessarily more alert. 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 He now describes waking with sleep paralysis and recognizes my description of hypnagogic hallucination. If startled he gets an uncomfortable weak aching feeling in the back of his neck, his knees get weak, sounds like cataplexy. I had not gotten is clear description before.  03/03/13- 44 yoM former smoker followed for management of obstructive sleep apnea and hypersomnia/ narcolepsy with cataplexy complicated by history of allergic rhinitis and atrial fibrillation, diabetes, HTN. PCP- Dr Trula Slade 1 month follow up.  Wearing cpap 13/ [employer] everynight for approx 7 hours.  No problems with mask or pressure, he does follow some air. Still having daytime sleepimess.  Adderall helps him feel alert but lasts only 2 hours, and he still does. 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 Pressure reduced to 11 due to gas pains.   05/05/13- 44 yoM former smoker followed for management of obstructive sleep apnea and hypersomnia/ narcolepsy with cataplexy complicated by history of allergic rhinitis and atrial fibrillation, diabetes, HTN. PCP- Dr Trula Slade FOLLOWS FOR: pt reports wearing CPAP11/ [employer] machine everynight x 7 hrs per night -- pt reports pressure setting is comfortable no supplies needed-- denies any other concerns at this time Continues Dexedrine, one at 5 AM and one at lunch time. Any later in the day interferes with sleep at night. 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  11/14/13- 44 yoM former smoker followed for management of obstructive sleep apnea and hypersomnia/ narcolepsy with cataplexy complicated by history of allergic rhinitis and atrial fibrillation, diabetes, HTN. PCP- Dr Trula Slade CPAP11/ [facility] all night every night Working for his [employer] when he can.  Signed for total disability as of 01/17/2013. Sleep is about the same. He paces  himself during the day. Stopped Dexedrine. CXR 01/23/13 IMPRESSION:  Cardiomegaly prominent perihilar markings consistent with  bronchitis and possibly mild pulmonary vascular congestion.  Consider follow-up.  Original Report Authenticated By: Dwyane Dee, [name]  05/14/14- 41 yoM former smoker followed for management of obstructive sleep apnea and hypersomnia/ narcolepsy with cataplexy complicated by history of allergic rhinitis and atrial fibrillation, diabetes, HTN. PCP- Dr Trula Slade FOLLOWS FOR: pt states he wishes to change from adderall to another medication.  Missed appt with [facility] last week to get a new cpap 11 (was [facility]- now ? [facility]), hasn't rescheduled yet .  Wears cpap "off and on" X few hours per night.   Preferred Dexedrine over Adderall, but too expensive.frequent cataplexy Now has lawyer helping him with disability application CXR 11/19/13 IMPRESSION:  1. Mild cardiomegaly. Otherwise, no significant abnormalities are  observed.  Electronically Signed  By: Herbie Baltimore [name]  On: 11/19/2013 18:24  10/29/14- 19 yoM former smoker followed for management of obstructive sleep apnea and hypersomnia/ narcolepsy with cataplexy complicated by history of allergic rhinitis and atrial fibrillation, dCHF, diabetes, HTN. PCP- Dr Trula Slade FOLLOWS FOR:not wearing CPAP 13/ [facility] as he should Download documents inadequate use but satisfactory control. Says bad toothache made worse by airflow from CPAP and nasal congestion worse some nights where he takes mask off. Okay on Adderall XR 30 mg 1 daily. Denies any cocaine use any longer.  03/10/15- 43  yoM  smoker followed for management of obstructive sleep apnea and hypersomnia/ narcolepsy with cataplexy complicated by history of allergic rhinitis and atrial fibrillation, dCHF, diabetes, HTN. PCP- Dr Ferne Reuson Polite     CPAP 13/ [facility]  Normal Card stress test in March FOLLOWS FOR: pt not using CPAP as long as he should; pt is having alot of  trouble with type of mask; DME is [facility]. Also, patient feels like pressure is not enough. Mask won't seal CXR 03/08/15 IMPRESSION: Suspected pulmonary edema and bibasilar atelectasis on this hypoventilated examination. A follow-up chest radiograph in 3 to 4 weeks after treatment is recommended to ensure resolution. Electronically Signed  By: Simonne ComeJohn Watts [name]  On: 03/08/2015 01:  09/10/2015-44 year old male smoker followed for management of OSA and narcolepsy with cataplexy, complicated by allergic rhinitis, atrial fibrillation, dCHF, DM, HTN CPAP 13/[facility] FOLLOWS FOR:  Pt has not worn CPAP since June. Pt states that he felt like he was suffocating while wearing it. Pt states he sleeps avg 4hrs per night. Rhinitis nasal congestion contributes to being smothered at night. Denies using cocaine now. Previously saw Dr. Macie BurowsNewman/ENT. Okay with current medications. No exacerbation of atrial fibrillation. Not using CPAP "smothers" and pressure 13.  ROS-see HPI Constitutional:   No-   weight loss, night sweats, fevers, chills,  +fatigue, lassitude. HEENT:   No-  headaches, difficulty swallowing, tooth/dental problems, sore throat,       No-  sneezing, itching, ear ache, + nasal congestion, post nasal drip,  CV:  No-   chest pain, orthopnea, PND, swelling in lower extremities, anasarca, dizziness, palpitations Resp: No-   shortness of breath with exertion or at rest.              No-   productive cough,  No non-productive cough,  No- coughing up of blood.              No-   change in color of mucus.  No- wheezing.   Skin: No-   rash or lesions. GI:  No-   heartburn, indigestion, abdominal pain, nausea, vomiting,  GU:  MS:  No-   joint pain or swelling.  Neuro-     nothing unusual Psych:  No- change in mood or affect. No depression or anxiety.  No memory loss.  OBJ- Physical Exam   General- Alert/ calm, Oriented, Affect-appropriate, Distress- none acute, +obese,  Skin- rash-none, lesions-  none, excoriation- none Lymphadenopathy- none Head- atraumatic            Eyes- Gross vision intact, PERRLA, conjunctivae and secretions clear            Ears- Hearing, canals-normal            Nose- rhinitis/ turbinate edema, no-Septal dev, mucus, polyps, erosion, perforation             Throat- Mallampati III , mucosa clear , drainage- none, tonsils- atrophic Neck- flexible , trachea midline, no stridor , thyroid nl, carotid no bruit Chest - symmetrical excursion , unlabored           Heart/CV- RRR , no murmur , no gallop  , no rub, nl s1 s2                           - JVD- none , edema- none, stasis changes- none, varices- none           Lung- clear to P&A, wheeze- none, cough- none , dullness-none, rub-  none           Chest wall-  Abd-  Br/ Gen/ Rectal- Not done, not indicated Extrem- cyanosis- none, clubbing, none, atrophy- none, strength- nl

## 2015-09-10 NOTE — Assessment & Plan Note (Signed)
Rhinitis likely more involved than allergy. He denies cocaine and I hope he is being truthful. He may benefit from surgical turbinate reduction and is recommended back to Dr. Macie BurowsNewman/ENT who had worked with him years ago. Plan-sample Dymista nasal spray for trial

## 2015-09-13 ENCOUNTER — Ambulatory Visit: Payer: 59 | Admitting: Physician Assistant

## 2015-09-30 ENCOUNTER — Other Ambulatory Visit: Payer: Self-pay | Admitting: Otolaryngology

## 2015-09-30 DIAGNOSIS — R52 Pain, unspecified: Secondary | ICD-10-CM

## 2015-09-30 DIAGNOSIS — J349 Unspecified disorder of nose and nasal sinuses: Secondary | ICD-10-CM

## 2015-10-08 ENCOUNTER — Other Ambulatory Visit: Payer: Self-pay

## 2015-10-08 ENCOUNTER — Ambulatory Visit: Payer: Self-pay | Admitting: *Deleted

## 2015-10-14 ENCOUNTER — Telehealth: Payer: Self-pay | Admitting: Internal Medicine

## 2015-10-14 MED ORDER — AMPHETAMINE-DEXTROAMPHET ER 30 MG PO CP24: 30.0000 mg | ORAL_CAPSULE | Freq: Every day | ORAL | Status: DC | PRN

## 2015-10-14 NOTE — Telephone Encounter (Signed)
Pt is requesting refill on adderall 30 mg Last refilled 09/08/15 #30 x 0 refills Take 1 capsule (30 mg total) by mouth daily as needed  Please advise Dr. Maple HudsonYoung thanks

## 2015-10-14 NOTE — Telephone Encounter (Signed)
Spoke with pt. Aware RX left for pick up. Nothing further needed

## 2015-10-14 NOTE — Telephone Encounter (Signed)
Ok to refill 

## 2015-11-02 ENCOUNTER — Encounter (HOSPITAL_COMMUNITY): Payer: Self-pay

## 2015-11-02 DIAGNOSIS — R1013 Epigastric pain: Secondary | ICD-10-CM | POA: Diagnosis not present

## 2015-11-02 DIAGNOSIS — Z794 Long term (current) use of insulin: Secondary | ICD-10-CM | POA: Diagnosis not present

## 2015-11-02 DIAGNOSIS — F17200 Nicotine dependence, unspecified, uncomplicated: Secondary | ICD-10-CM | POA: Diagnosis not present

## 2015-11-02 DIAGNOSIS — R112 Nausea with vomiting, unspecified: Secondary | ICD-10-CM | POA: Diagnosis not present

## 2015-11-02 DIAGNOSIS — Z8669 Personal history of other diseases of the nervous system and sense organs: Secondary | ICD-10-CM | POA: Insufficient documentation

## 2015-11-02 DIAGNOSIS — I480 Paroxysmal atrial fibrillation: Secondary | ICD-10-CM | POA: Diagnosis not present

## 2015-11-02 DIAGNOSIS — Z7984 Long term (current) use of oral hypoglycemic drugs: Secondary | ICD-10-CM | POA: Diagnosis not present

## 2015-11-02 DIAGNOSIS — K5900 Constipation, unspecified: Secondary | ICD-10-CM | POA: Insufficient documentation

## 2015-11-02 DIAGNOSIS — Z7901 Long term (current) use of anticoagulants: Secondary | ICD-10-CM | POA: Diagnosis not present

## 2015-11-02 DIAGNOSIS — R109 Unspecified abdominal pain: Secondary | ICD-10-CM | POA: Diagnosis present

## 2015-11-02 DIAGNOSIS — I10 Essential (primary) hypertension: Secondary | ICD-10-CM | POA: Diagnosis not present

## 2015-11-02 DIAGNOSIS — E119 Type 2 diabetes mellitus without complications: Secondary | ICD-10-CM | POA: Insufficient documentation

## 2015-11-02 NOTE — ED Notes (Signed)
Pt here for epigastric pain constant since last Thursday. Started taking his protonix again. Has been nauseated and vomiting. Had a BM yesterday. Constipated.

## 2015-11-03 ENCOUNTER — Emergency Department (HOSPITAL_COMMUNITY)
Admission: EM | Admit: 2015-11-03 | Discharge: 2015-11-03 | Disposition: A | Discharge status: Home / Self Care | Payer: 59 | Attending: Emergency Medicine | Admitting: Emergency Medicine

## 2015-11-03 ENCOUNTER — Emergency Department (HOSPITAL_COMMUNITY): Payer: 59

## 2015-11-03 DIAGNOSIS — R1013 Epigastric pain: Secondary | ICD-10-CM

## 2015-11-03 LAB — CBC
HEMATOCRIT: 42.3 % (ref 39.0–52.0)
HEMOGLOBIN: 14.8 g/dL (ref 13.0–17.0)
MCH: 29.1 pg (ref 26.0–34.0)
MCHC: 35 g/dL (ref 30.0–36.0)
MCV: 83.3 fL (ref 78.0–100.0)
Platelets: 271 10*3/uL (ref 150–400)
RBC: 5.08 MIL/uL (ref 4.22–5.81)
RDW: 12.9 % (ref 11.5–15.5)
WBC: 9.6 10*3/uL (ref 4.0–10.5)

## 2015-11-03 LAB — URINALYSIS, ROUTINE W REFLEX MICROSCOPIC
Glucose, UA: 250 mg/dL — AB
KETONES UR: NEGATIVE mg/dL
Leukocytes, UA: NEGATIVE
Nitrite: NEGATIVE
PH: 5 (ref 5.0–8.0)
Protein, ur: 30 mg/dL — AB
Specific Gravity, Urine: 1.028 (ref 1.005–1.030)

## 2015-11-03 LAB — COMPREHENSIVE METABOLIC PANEL
ALK PHOS: 63 U/L (ref 38–126)
ALT: 22 U/L (ref 17–63)
ANION GAP: 9 (ref 5–15)
AST: 22 U/L (ref 15–41)
Albumin: 3.7 g/dL (ref 3.5–5.0)
BUN: 10 mg/dL (ref 6–20)
CALCIUM: 9 mg/dL (ref 8.9–10.3)
CO2: 25 mmol/L (ref 22–32)
Chloride: 104 mmol/L (ref 101–111)
Creatinine, Ser: 0.96 mg/dL (ref 0.61–1.24)
GFR calc Af Amer: >60 mL/min (ref >60)
GLUCOSE: 223 mg/dL — AB (ref 65–99)
POTASSIUM: 4.3 mmol/L (ref 3.5–5.1)
Sodium: 138 mmol/L (ref 135–145)
TOTAL PROTEIN: 7.2 g/dL (ref 6.5–8.1)
Total Bilirubin: 0.9 mg/dL (ref 0.3–1.2)

## 2015-11-03 LAB — URINE MICROSCOPIC-ADD ON

## 2015-11-03 LAB — I-STAT TROPONIN, ED: Troponin i, poc: 0.02 ng/mL (ref 0.00–0.08)

## 2015-11-03 LAB — LIPASE, BLOOD: Lipase: 105 U/L — ABNORMAL HIGH (ref 11–51)

## 2015-11-03 IMAGING — CR DG ABDOMEN ACUTE W/ 1V CHEST
4 series · 4 of 4 positions shown · non-contrast
Comparison: Chest radiograph dated [DATE]

CLINICAL DATA: 44-year-old male with mid abdominal pain and nausea.

EXAM:
DG ABDOMEN ACUTE W/ 1V CHEST

[chest pa]
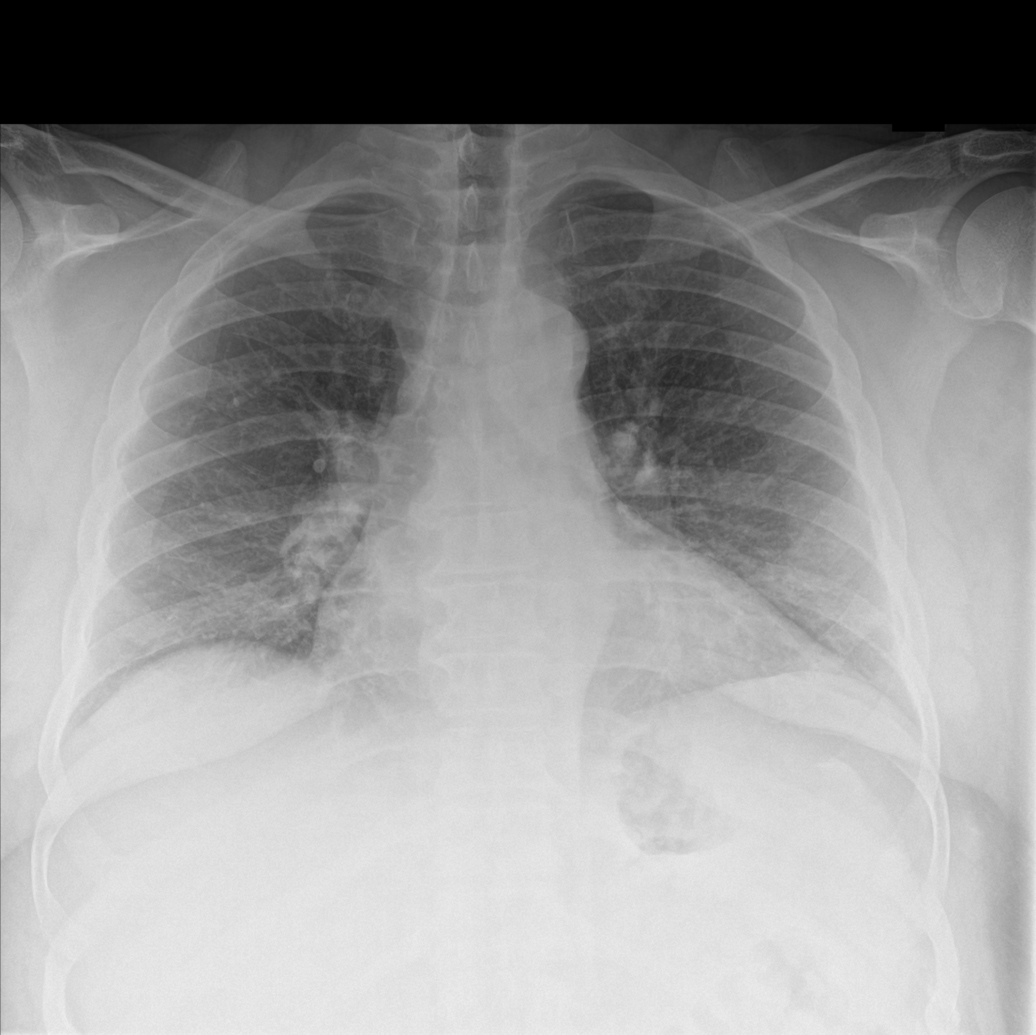

[abdomen erect]
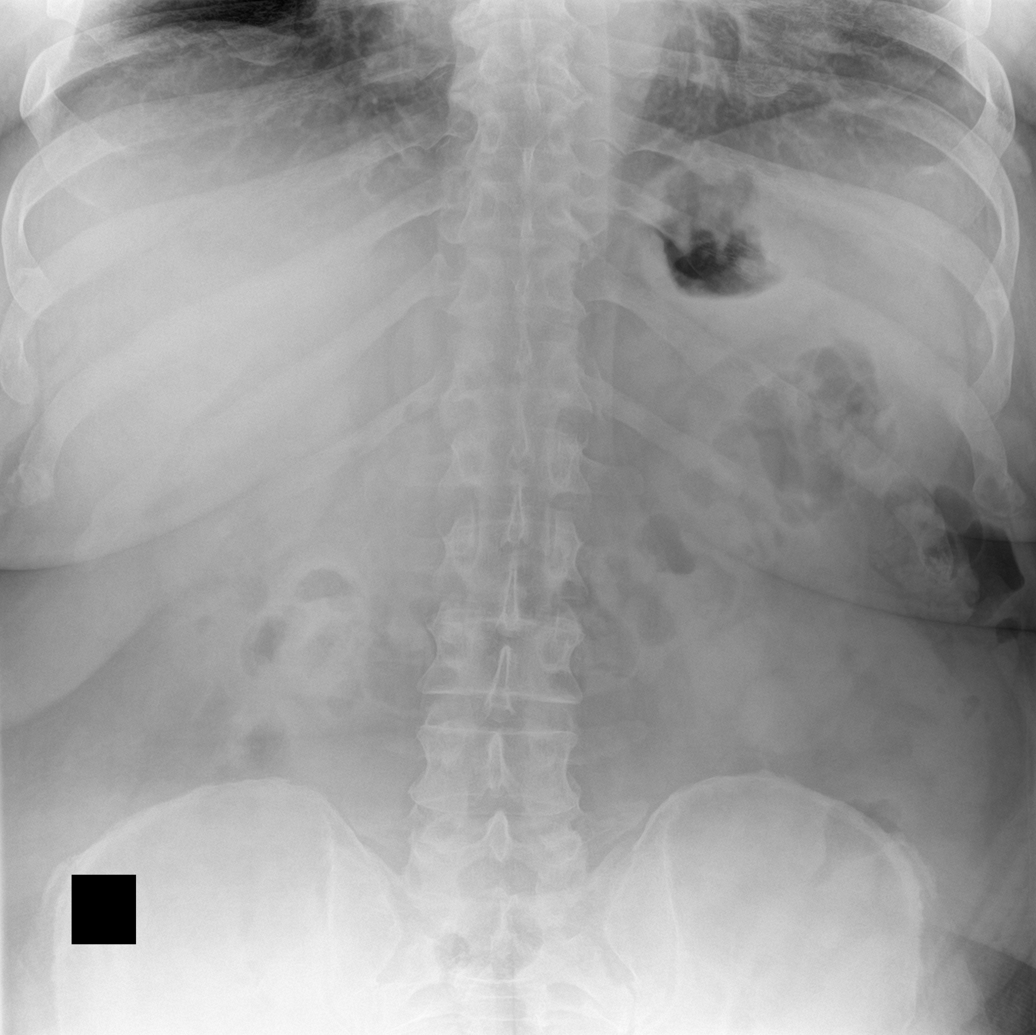

[abdomen supine (1 of 2)]
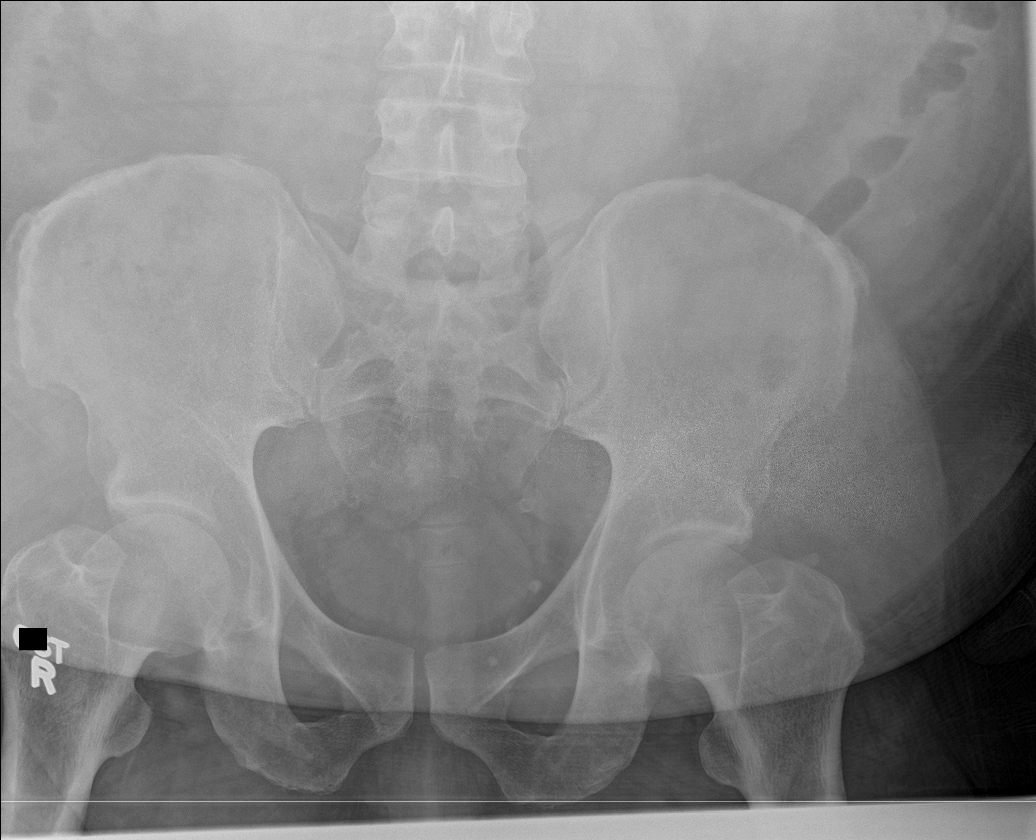

[abdomen supine (2 of 2)]
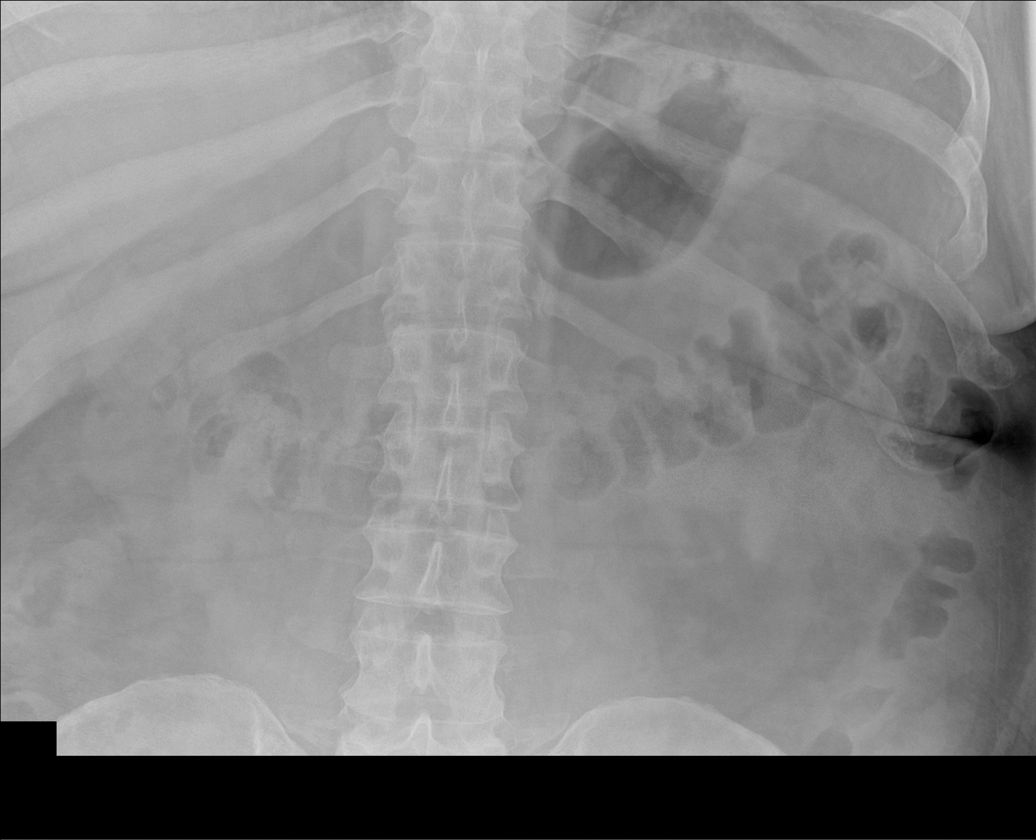

[4 of 4 positions shown; findings below may reference images not displayed]

FINDINGS: There is no evidence of dilated bowel loops or free intraperitoneal
air. No radiopaque calculi or other significant radiographic
abnormality is seen. Heart size and mediastinal contours are within
normal limits. Both lungs are clear.
IMPRESSION: Negative abdominal radiographs.  No acute cardiopulmonary disease.

## 2015-11-03 MED ORDER — GI COCKTAIL ~~LOC~~
30.0000 mL | Freq: Once | ORAL | Status: AC
  Administered 2015-11-03: 30 mL via ORAL
  Filled 2015-11-03: qty 30

## 2015-11-03 MED ORDER — PANTOPRAZOLE SODIUM 40 MG PO TBEC: 40.0000 mg | DELAYED_RELEASE_TABLET | Freq: Every day | ORAL | Status: DC

## 2015-11-03 MED ORDER — SUCRALFATE 1 G PO TABS: 1.0000 g | ORAL_TABLET | Freq: Three times a day (TID) | ORAL | Status: DC

## 2015-11-03 NOTE — Discharge Instructions (Signed)

## 2015-11-03 NOTE — ED Notes (Signed)
Pt given diet sprite and saltine crackers

## 2015-11-03 NOTE — ED Notes (Signed)
Patient transported to X-ray 

## 2015-11-03 NOTE — ED Provider Notes (Signed)
CSN: [id number]     Arrival date & time 11/02/15  2342 History  By signing my name below, I, Freida Busman, attest that this documentation has been prepared under the direction and in the presence of Shon Baton, MD . Electronically Signed: Freida Busman, Scribe. 11/03/2015. 3:35 AM.    Chief Complaint  Patient presents with  . Abdominal Pain    The history is provided by the patient. No language interpreter was used.   HPI Comments:  Jakhi Dishman is a 45 y.o. male who presents to the Emergency Department complaining of intermittent, throbbing, abdominal pain x ~ 6 days. Pt rates pain a 10/10 when present. He notes pain is exacerbated after eating. Pt reports associated nausea, vomiting, and constipation. Pt has a h/o stomach ulcer, states  pain today is similar. He restarted taking his acid reducer when symptoms began 6 days ago with little relief. Pt denies heavy ETOH use, states he drinks ~ once a month.  Pt is currently on xarelto.   Past Medical History  Diagnosis Date  . Hypertension   . Diabetes mellitus   . Sleep apnea   . Morbid obesity (HCC)   . History of cocaine use   . History of alcohol abuse   . Paroxysmal atrial fibrillation (HCC)     Occurring in 2008, with several recurrence since then (including in the setting of + cocaine on UDS).  . Obesity   . Hx of cardiovascular stress test     ETT-Myoview 3/16:  Low risk, no ischemia, EF 58%   Past Surgical History  Procedure Laterality Date  . Appendectomy    . Rotator cuff repair    . Tonsillectomy     Family History  Problem Relation Age of Onset  . Heart attack Father   . Diabetes Mother   . Stroke Maternal Grandmother   . Stroke Paternal Grandmother    Social History  Substance Use Topics  . Smoking status: Current Every Day Smoker -- 0.10 packs/day for 10 years    Last Attempt to Quit: 10/17/2007  . Smokeless tobacco: Current User    Types: Snuff     Comment: smokes one pack per month  .  Alcohol Use: Yes     Comment: occasionally - 1 beer 1x/mont    Review of Systems  Constitutional: Negative for fever and chills.  Respiratory: Negative for shortness of breath.   Cardiovascular: Negative for chest pain.  Gastrointestinal: Positive for nausea, vomiting, abdominal pain and constipation.  All other systems reviewed and are negative.   Allergies  Shrimp; Banana; Watermelon flavor; Other; Almond oil; and Sulfa antibiotics  Home Medications   Prior to Admission medications   Medication Sig Start Date End Date Taking? Authorizing Provider  amphetamine-dextroamphetamine (ADDERALL XR) 30 MG 24 hr capsule Take 1 capsule (30 mg total) by mouth daily as needed (For narcolepsy.). 10/14/15  Yes Waymon Budge, MD  atorvastatin (LIPITOR) 10 MG tablet Take 10 mg by mouth at bedtime.    Yes Historical Provider, MD  Azelastine-Fluticasone (DYMISTA) 137-50 MCG/ACT SUSP Place 2 sprays into both nostrils at bedtime. 09/10/15  Yes Waymon Budge, MD  flecainide (TAMBOCOR) 100 MG tablet Take 1 tablet (100 mg total) by mouth 2 (two) times daily. 12/10/14  Yes Scott T Alben Spittle, PA-C  glipiZIDE (GLUCOTROL) 10 MG 24 hr tablet Take 10 mg by mouth 2 (two) times daily.  02/28/11  Yes Historical Provider, MD  LANTUS SOLOSTAR 100 UNIT/ML injection Inject 20 Units  into the skin at bedtime.  04/22/12  Yes Historical Provider, MD  lisinopril-hydrochlorothiazide (PRINZIDE,ZESTORETIC) 10-12.5 MG per tablet Take 1 tablet by mouth at bedtime.    Yes Historical Provider, MD  metFORMIN (GLUCOPHAGE) 500 MG tablet Take 1,000 mg by mouth 2 (two) times daily.    Yes Historical Provider, MD  metoprolol succinate (TOPROL XL) 25 MG 24 hr tablet Take 1 tablet (25 mg total) by mouth daily. 12/10/14  Yes Beatrice Lecher, PA-C  rivaroxaban (XARELTO) 20 MG TABS tablet Take 1 tablet (20 mg total) by mouth at bedtime. 12/10/14  Yes Beatrice Lecher, PA-C  fluticasone (FLONASE) 50 MCG/ACT nasal spray 2 sprays each nostril once or  twice daily every day while needed Patient not taking: Reported on 11/03/2015 10/29/14   Waymon Budge, MD  pantoprazole (PROTONIX) 40 MG tablet Take 1 tablet (40 mg total) by mouth daily. 11/03/15   Shon Baton, MD  sucralfate (CARAFATE) 1 g tablet Take 1 tablet (1 g total) by mouth 4 (four) times daily -  with meals and at bedtime. 11/03/15   Shon Baton, MD  traMADol (ULTRAM) 50 MG tablet 1 or 2 every 8 hours if needed for pain Patient not taking: Reported on 11/03/2015 10/29/14   Waymon Budge, MD   BP 193/95 mmHg  Pulse 72  Temp(Src) 98 F (36.7 C) (Oral)  Resp 18  Ht  (1.753 m)  Wt 317 lb (143.79 kg)  BMI 46.79 kg/m2  SpO2 99% Physical Exam  Constitutional: He is oriented to person, place, and time. He appears well-developed and well-nourished.  Obese  HENT:  Head: Normocephalic and atraumatic.  Eyes: Pupils are equal, round, and reactive to light.  Cardiovascular: Normal rate, regular rhythm and normal heart sounds.   No murmur heard. Pulmonary/Chest: Effort normal and breath sounds normal. No respiratory distress. He has no wheezes.  Abdominal: Soft. Bowel sounds are normal. There is tenderness. There is no rebound and no guarding.  Epigastric tenderness to palpation without rebound or guarding  Musculoskeletal: He exhibits no edema.  Neurological: He is alert and oriented to person, place, and time.  Skin: Skin is warm and dry.  Psychiatric: He has a normal mood and affect.  Nursing note and vitals reviewed.   ED Course  Procedures   DIAGNOSTIC STUDIES:  Oxygen Saturation is 96% on RA, normal by my interpretation.    COORDINATION OF CARE:  3:29 AM Discussed treatment plan with pt at bedside and pt agreed to plan.  Labs Review Labs Reviewed  LIPASE, BLOOD - Abnormal; Notable for the following:    Lipase 105 (*)    All other components within normal limits  COMPREHENSIVE METABOLIC PANEL - Abnormal; Notable for the following:    Glucose, Bld 223  (*)    All other components within normal limits  URINALYSIS, ROUTINE W REFLEX MICROSCOPIC (NOT AT [facility]) - Abnormal; Notable for the following:    Glucose, UA 250 (*)    Hgb urine dipstick TRACE (*)    Bilirubin Urine SMALL (*)    Protein, ur 30 (*)    All other components within normal limits  URINE MICROSCOPIC-ADD ON - Abnormal; Notable for the following:    Squamous Epithelial / LPF 0-5 (*)    Bacteria, UA FEW (*)    Casts HYALINE CASTS (*)    All other components within normal limits  CBC  I-STAT TROPOININ, ED    Imaging Review Dg Abd Acute W/chest  11/03/2015  CLINICAL DATA:  45 year old male with mid abdominal pain and nausea. EXAM: DG ABDOMEN ACUTE W/ 1V CHEST COMPARISON:  Chest radiograph dated 03/08/2015 FINDINGS: There is no evidence of dilated bowel loops or free intraperitoneal air. No radiopaque calculi or other significant radiographic abnormality is seen. Heart size and mediastinal contours are within normal limits. Both lungs are clear. IMPRESSION: Negative abdominal radiographs.  No acute cardiopulmonary disease. Electronically Signed   By: Elgie Collard [name]   On: 11/03/2015 04:23   I have personally reviewed and evaluated these images and lab results as part of my medical decision-making.   EKG Interpretation   Date/Time:  Tuesday November 02 2015 23:50:55 EST Ventricular Rate:  82 PR Interval:  168 QRS Duration: 78 QT Interval:  392 QTC Calculation: 457 R Axis:   -25 Text Interpretation:  Normal sinus rhythm Septal infarct , age  undetermined Abnormal ECG No significant change since last tracing  Confirmed by HORTON  MD, Toni Amend ([id number]) on 11/03/2015 5:17:33 AM      MDM   Final diagnoses:  Epigastric pain    Patient presents with epigastric pain. Similar prior episodes of GERD and ulcer. Nontoxic on exam. Tender without signs of peritonitis. Basic labwork obtained. Lipase slightly elevated but comparable to one year ago.  Patient given GI cocktail.  Plain films of the chest and abdomen without evidence of free air. Suspect GERD versus peptic ulcer disease. On recheck, patient is sleeping comfortably. Reports some improvement of symptoms. Was able to tolerate fluids. Will give patient follow up with GI. Continue PPI at home. Will also be given Carafate.  After history, exam, and medical workup I feel the patient has been appropriately medically screened and is safe for discharge home. Pertinent diagnoses were discussed with the patient. Patient was given return precautions.   I personally performed the services described in this documentation, which was scribed in my presence. The recorded information has been reviewed and is accurate.    Shon Baton, MD 11/03/15 [phone]

## 2015-11-03 NOTE — ED Notes (Signed)
Pt reports managing PO challenge without trouble

## 2015-11-15 ENCOUNTER — Telehealth: Payer: Self-pay | Admitting: Internal Medicine

## 2015-11-15 MED ORDER — AMPHETAMINE-DEXTROAMPHET ER 30 MG PO CP24: 30.0000 mg | ORAL_CAPSULE | Freq: Every day | ORAL | Status: DC | PRN

## 2015-11-15 NOTE — Telephone Encounter (Signed)
Ok to refill 

## 2015-11-15 NOTE — Telephone Encounter (Signed)
Called and spoke pt. Informed him of CY's recs and explained that the rx would be ready for pick up at the front of the office tomorrow morning. Pt voiced understanding and had no further questions. Rx printed, signed, and and placed at the front office. Nothing further needed at this time.

## 2015-11-15 NOTE — Telephone Encounter (Signed)
Spoke with pt, requesting to pick up a refill on Adderall. Adderall XR  last refilled 10/14/2015- #30, 1 tab qd prn with 0 refills.    CY please advise on refill.  Thanks!

## 2015-12-06 ENCOUNTER — Telehealth: Payer: Self-pay | Admitting: Internal Medicine

## 2015-12-06 NOTE — Telephone Encounter (Signed)
Called spoke with pt. He was requesting refill on adderall 30 mg. He last had this refilled on 11/15/15. Pt is not out and I advised him this was early refill. He reports he will wait and call next week bc he did not realized what the date was. Nothing further needed

## 2015-12-15 ENCOUNTER — Telehealth: Payer: Self-pay | Admitting: Internal Medicine

## 2015-12-15 MED ORDER — AMPHETAMINE-DEXTROAMPHET ER 30 MG PO CP24: 30.0000 mg | ORAL_CAPSULE | Freq: Every day | ORAL | Status: DC | PRN

## 2015-12-15 NOTE — Telephone Encounter (Signed)
Ok to refill 

## 2015-12-15 NOTE — Telephone Encounter (Signed)
Pt is needing a refill on Adderall. Last OV was on 09/10/15. Has pending OV on 03/10/16. Last refill was on 11/15/15 #30.  CY - please advise. Thanks.

## 2015-12-15 NOTE — Telephone Encounter (Signed)
Spoke with pt. Rx has been signed by CY. Pt would like to pick this up here at the office. Rx has been placed up front. Nothing further was needed.

## 2016-01-08 ENCOUNTER — Other Ambulatory Visit: Payer: Self-pay | Admitting: Physician Assistant

## 2016-01-17 ENCOUNTER — Telehealth: Payer: Self-pay | Admitting: Internal Medicine

## 2016-01-17 MED ORDER — AMPHETAMINE-DEXTROAMPHET ER 30 MG PO CP24: 30.0000 mg | ORAL_CAPSULE | Freq: Every day | ORAL | Status: DC | PRN

## 2016-01-17 NOTE — Telephone Encounter (Signed)
Pt is needing a refill on Adderall. Last OV was on 09/10/15. Has pending OV on 03/10/16. Last refill was on 12/15/15 #30.  CY - please advise. Thanks.

## 2016-01-17 NOTE — Telephone Encounter (Signed)
Ok to refill 

## 2016-01-17 NOTE — Telephone Encounter (Signed)
Rx signed and up front for pick up  [facility]

## 2016-01-18 ENCOUNTER — Other Ambulatory Visit: Payer: Self-pay | Admitting: Physician Assistant

## 2016-01-18 NOTE — Telephone Encounter (Signed)
Approved      Disp Refills Start End    metoprolol succinate (TOPROL-XL) 25 MG 24 hr tablet 30 tablet 0 01/10/2016     Sig:  TAKE 1 TABLET (25 MG TOTAL) BY MOUTH DAILY.    Class:  Normal    DAW:  No    Comment:  Patient is overdue for an appointment. Please call and schedule for further refills.    Authorizing Provider:  Beatrice LecherScott T Weaver, PA-C    Ordering User:  Valrie HartMindy L Isley, CMA    flecainide (TAMBOCOR) 100 MG tablet 60 tablet 0 01/10/2016     Sig:  TAKE 1 TABLET (100 MG TOTAL) BY MOUTH 2 (TWO) TIMES DAILY.    Class:  Normal    DAW:  No    Comment:  Patient is overdue for an appointment. Please call and schedule for further refills.    Authorizing Provider:  Beatrice LecherScott T Weaver, PA-C    Ordering User:  Valrie HartMindy L Isley, CMA      Visit Pharmacy     [facility] [phone] [place] - [address]     Contacts

## 2016-01-27 ENCOUNTER — Other Ambulatory Visit: Payer: Self-pay | Admitting: *Deleted

## 2016-01-27 ENCOUNTER — Telehealth: Payer: Self-pay | Admitting: Interventional Cardiology

## 2016-01-27 NOTE — Telephone Encounter (Signed)
Per pt call is out of medications and would like samples of:  FLECAINIDE  XARELTO  METOPROLO  Pt asked to be given a call if any samples to pick up.

## 2016-01-27 NOTE — Telephone Encounter (Signed)
Samples of Xarelto have been placed up front for patient pick up.

## 2016-02-24 ENCOUNTER — Telehealth: Payer: Self-pay | Admitting: Internal Medicine

## 2016-02-24 NOTE — Telephone Encounter (Signed)
Please advise if okay to refill adderall 30 mg 1 qdprn  Last given 01/17/15 # 30  Last ov 09/10/15  Next ov 03/10/16 Thanks

## 2016-02-25 MED ORDER — AMPHETAMINE-DEXTROAMPHET ER 30 MG PO CP24: 30.0000 mg | ORAL_CAPSULE | Freq: Every day | ORAL | Status: DC | PRN

## 2016-02-25 NOTE — Telephone Encounter (Signed)
Ok to refill 

## 2016-02-25 NOTE — Telephone Encounter (Signed)
Rx has been signed by CY and placed up front for pick up. Pt is aware. Nothing further was needed.

## 2016-03-09 ENCOUNTER — Ambulatory Visit: Payer: Self-pay | Admitting: Physician Assistant

## 2016-03-10 ENCOUNTER — Ambulatory Visit: Payer: Self-pay | Admitting: Internal Medicine

## 2016-03-16 ENCOUNTER — Telehealth: Payer: Self-pay | Admitting: Internal Medicine

## 2016-03-16 NOTE — Telephone Encounter (Signed)
Spoke with pt. He has been scheduled with TP on 04/07/16 (per his request) at 2:30pm. Nothing further was needed.

## 2016-03-16 NOTE — Telephone Encounter (Signed)
Douglas AddisonKatie can this pt be worked in or can we schedule with NP?

## 2016-03-16 NOTE — Telephone Encounter (Signed)
NP is fine. Thanks.

## 2016-03-29 ENCOUNTER — Other Ambulatory Visit: Payer: Self-pay | Admitting: Internal Medicine

## 2016-03-29 NOTE — Telephone Encounter (Signed)
Called spoke with pt. He is requesting a refill on his adderall. Last ov with CY on 09/10/15 Next ov with TP on 04/08/15  adderall 30mg  1 tab by mouth daily PRN #30 with 0 refills Last refilled 02/25/16  CY please advise if okay to refill

## 2016-03-30 MED ORDER — AMPHETAMINE-DEXTROAMPHET ER 30 MG PO CP24: 30.0000 mg | ORAL_CAPSULE | Freq: Every day | ORAL | Status: DC | PRN

## 2016-03-30 NOTE — Telephone Encounter (Signed)
Spoke with the pt  He wants to pick up rx and this has been placed up front  Nothing further needed

## 2016-03-30 NOTE — Telephone Encounter (Signed)
Rx printed  Once CDY brings back to triage will call to ask pt whether me mail it or pick up

## 2016-03-30 NOTE — Telephone Encounter (Signed)
Ok to refill 

## 2016-04-06 ENCOUNTER — Ambulatory Visit (INDEPENDENT_AMBULATORY_CARE_PROVIDER_SITE_OTHER): Payer: 59 | Admitting: Adult Health

## 2016-04-06 ENCOUNTER — Encounter: Payer: Self-pay | Admitting: Adult Health

## 2016-04-06 VITALS — BP 126/76 | HR 79 | Temp 98.2°F | Ht 69.0 in | Wt 317.0 lb

## 2016-04-06 DIAGNOSIS — G4733 Obstructive sleep apnea (adult) (pediatric): Secondary | ICD-10-CM

## 2016-04-06 NOTE — Assessment & Plan Note (Signed)
Moderate sleep apnea with poor C Pap compliance. Patient will be changed from a fullface mask to nasal pillows to see if this helps with compliance. He is to continue on current regimen for hypersomnia/Narcolepsy  with Adderall Daytime naps suggested.   Plan  Change to nasal pillows for CPAP  Goal is to wear for at least 4 hr each night  Work on weight loss Do not drive if sleepy follow up Dr. Maple HudsonYoung  In 6 months and As needed

## 2016-04-06 NOTE — Progress Notes (Signed)
Subjective:    Patient ID: [name], male    DOB: August 25, 1971, 45 y.o.   MRN: [id number]  HPI 45 year old male former smoker with obstructive sleep apnea with hypersomnia.\Narcolepsy with cataplexy Polysubstance abuse   04/06/2016 Followup : OSA /narcolepsy with cataplexy Patient presented for a six-month follow-up for sleep apnea. Patient says he continues to have problems. His C Pap machine. Download shows 40% usage with AHI 7.3. He is on us AutoSet at 8-15 cm of H2O. Minimum leaks. Discussed C Pap compliance. He does remain on Adderall during the daytime for hypersomnia. He feels that his full face mask is smothering. We discussed changing mass to a nasal pillow mask. Patient denies any chest pain, orthopnea, PND, or increased leg swelling.  Past Medical History  Diagnosis Date  . Hypertension   . Diabetes mellitus   . Sleep apnea   . Morbid obesity (HCC)   . History of cocaine use   . History of alcohol abuse   . Paroxysmal atrial fibrillation (HCC)     Occurring in 2008, with several recurrence since then (including in the setting of + cocaine on UDS).  . Obesity   . Hx of cardiovascular stress test     ETT-Myoview 3/16:  Low risk, no ischemia, EF 58%   Current Outpatient Prescriptions on File Prior to Visit  Medication Sig Dispense Refill  . amphetamine-dextroamphetamine (ADDERALL XR) 30 MG 24 hr capsule Take 1 capsule (30 mg total) by mouth daily as needed (For narcolepsy.). 30 capsule 0  . glipiZIDE (GLUCOTROL) 10 MG 24 hr tablet Take 10 mg by mouth 2 (two) times daily.     Marland Kitchen. LANTUS SOLOSTAR 100 UNIT/ML injection Inject 20 Units into the skin at bedtime.     Marland Kitchen. lisinopril-hydrochlorothiazide (PRINZIDE,ZESTORETIC) 10-12.5 MG per tablet Take 1 tablet by mouth at bedtime.     . metFORMIN (GLUCOPHAGE) 500 MG tablet Take 1,000 mg by mouth 2 (two) times daily.     . metoprolol succinate (TOPROL-XL) 25 MG 24 hr tablet TAKE 1 TABLET (25 MG TOTAL) BY MOUTH DAILY. 15  tablet 0  . rivaroxaban (XARELTO) 20 MG TABS tablet Take 1 tablet (20 mg total) by mouth at bedtime. 30 tablet 6  . traMADol (ULTRAM) 50 MG tablet 1 or 2 every 8 hours if needed for pain 50 tablet 0  . atorvastatin (LIPITOR) 10 MG tablet Take 10 mg by mouth at bedtime. Reported on 04/06/2016    . Azelastine-Fluticasone (DYMISTA) 137-50 MCG/ACT SUSP Place 2 sprays into both nostrils at bedtime. (Patient not taking: Reported on 04/06/2016) 1 Bottle 0  . flecainide (TAMBOCOR) 100 MG tablet TAKE 1 TABLET (100 MG TOTAL) BY MOUTH 2 (TWO) TIMES DAILY. (Patient not taking: Reported on 04/06/2016) 30 tablet 0  . fluticasone (FLONASE) 50 MCG/ACT nasal spray 2 sprays each nostril once or twice daily every day while needed (Patient not taking: Reported on 04/06/2016) 16 g prn  . pantoprazole (PROTONIX) 40 MG tablet Take 1 tablet (40 mg total) by mouth daily. (Patient not taking: Reported on 04/06/2016) 30 tablet 0  . sucralfate (CARAFATE) 1 g tablet Take 1 tablet (1 g total) by mouth 4 (four) times daily -  with meals and at bedtime. (Patient not taking: Reported on 04/06/2016) 60 tablet 0   No current facility-administered medications on file prior to visit.      Review of Systems Constitutional:   No  weight loss, night sweats,  Fevers, chills, fatigue, or  lassitude.  HEENT:  No headaches,  Difficulty swallowing,  Tooth/dental problems, or  Sore throat,                No sneezing, itching, ear ache, nasal congestion, post nasal drip,   CV:  No chest pain,  Orthopnea, PND, swelling in lower extremities, anasarca, dizziness, palpitations, syncope.   GI  No heartburn, indigestion, abdominal pain, nausea, vomiting, diarrhea, change in bowel habits, loss of appetite, bloody stools.   Resp: No shortness of breath with exertion or at rest.  No excess mucus, no productive cough,  No non-productive cough,  No coughing up of blood.  No change in color of mucus.  No wheezing.  No chest wall deformity  Skin: no  rash or lesions.  GU: no dysuria, change in color of urine, no urgency or frequency.  No flank pain, no hematuria   MS:  No joint pain or swelling.  No decreased range of motion.  No back pain.  Psych: +anxiety         Objective:   Physical Exam  Filed Vitals:   04/06/16 1546  BP: 126/76  Pulse: 79  Temp: 98.2 F (36.8 C)  TempSrc: Oral  Height: 5\' 9"  (1.753 m)  Weight: 317 lb (143.79 kg)  SpO2: 96%  Body mass index is 46.79 kg/(m^2).   GEN: A/Ox3; pleasant , NAD, obese   HEENT:  [place]/AT,  EACs-clear, TMs-wnl, NOSE-clear, THROAT-clear, no lesions, no postnasal drip or exudate noted. Class 2-3 MP airway   NECK:  Supple w/ fair ROM; no JVD; normal carotid impulses w/o bruits; no thyromegaly or nodules palpated; no lymphadenopathy.  RESP  Clear  P & A; w/o, wheezes/ rales/ or rhonchi.no accessory muscle use, no dullness to percussion  CARD:  RRR, no m/r/g  , no peripheral edema, pulses intact, no cyanosis or clubbing.  GI:   Soft & nt; nml bowel sounds; no organomegaly or masses detected.  Musco: Warm bil, no deformities or joint swelling noted.   Neuro: alert, no focal deficits noted.    Skin: Warm, no lesions or rashes   Yaqueline Gutter NP-C  [facility]  04/06/2016

## 2016-04-06 NOTE — Progress Notes (Signed)
Cardiology Office Note:    Date:  04/07/2016   ID:  Douglas BatemanLeonidas [name], DOB 1970/11/22, MRN [id number]  PCP:  Douglas ApoPOLITE,Douglas D, MD  Cardiologist: [name]  Electrophysiologist: Dr. Hillis RangeJames Walsh ([facility] in [facility])  Referring MD: [name], Ronald, MD   Chief Complaint  Patient presents with  . Atrial Fibrillation    follow up    History of Present Illness:     [name] is a 45 y.o. male with a hx of HTN, DM2, obesity, OSA on CPAP, prior ETOH/cocaine use and PAF. Remotely seen by Dr. Reyes Walsh in the past. He was evaluated by Dr. Delane GingerPhil Walsh in 4/14 in the hospital during an admission with PAF with RVR. 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 He converted to NSR and long-term anticoagulation was recommended given his stroke risk. Flecainide was added to maintain NSR. He was to follow up in this office for ETT to rule out pro-arrhythmia, but was not seen here until 2/16. Review of his chart indicates [name].  When last seen here he was placed back on Flecainide and FU ETT-Myoview was low risk and neg for pro-arrhythmia.    CHADS2-VASc=2 (HTN, DM)   He returns for follow-up. He is here alone. He ran out of flecainide 2 weeks ago. He has not taken any blood pressure medications yet today. He denies chest pain, syncope, orthopnea, PND or edema. He sleeps with CPAP. He does note occasional episodes of dizziness and visual changes if he over exerts himself or does excessive straining. Blood sugar is usually normal during this time. He has not evaluated his blood pressure at these times. He has apparently been told he's had low blood pressure in the past and his medications have been reduced.   Past Medical History  Diagnosis Date  . Hypertension   . Diabetes mellitus   . Sleep apnea   . Morbid obesity  (HCC)   . History of cocaine use   . History of alcohol abuse   . Paroxysmal atrial fibrillation (HCC)     Occurring in 2008, with several recurrence since then (including in the setting of + cocaine on UDS).  . Obesity   . Hx of cardiovascular stress test     a. GXT 5/14: No ischemic changes  //  b. ETT-Myoview 3/16:  Low risk, no ischemia, EF 58%  . History of echocardiogram     a. Echo 4/14: Moderate LVH, vigorous LVEF, EF 65-70%, normal wall motion, grade 2 diastolic dysfunction, mildly dilated aortic root and ascending aorta, ascending aorta 40 mm, aortic root 38 mm, mild LAE    Past Surgical History  Procedure Laterality Date  . Appendectomy    . Rotator cuff repair    . Tonsillectomy      Current Medications: Outpatient Prescriptions Prior to Visit  Medication Sig Dispense Refill  . amphetamine-dextroamphetamine (ADDERALL XR) 30 MG 24 hr capsule Take 1 capsule (30 mg total) by mouth daily as needed (For narcolepsy.). 30 capsule 0  . atorvastatin (LIPITOR) 10 MG tablet Take 10 mg by mouth at bedtime. Reported on 04/06/2016    . glipiZIDE (GLUCOTROL) 10 MG 24 hr tablet Take 10 mg by mouth 2 (two) times daily.     Marland Kitchen. LANTUS SOLOSTAR 100 UNIT/ML injection Inject 20 Units into the skin at bedtime.     .Marland Kitchen  lisinopril-hydrochlorothiazide (PRINZIDE,ZESTORETIC) 10-12.5 MG per tablet Take 1 tablet by mouth at bedtime.     . metFORMIN (GLUCOPHAGE) 500 MG tablet Take 1,000 mg by mouth 2 (two) times daily.     . flecainide (TAMBOCOR) 100 MG tablet TAKE 1 TABLET (100 MG TOTAL) BY MOUTH 2 (TWO) TIMES DAILY. 30 tablet 0  . metoprolol succinate (TOPROL-XL) 25 MG 24 hr tablet TAKE 1 TABLET (25 MG TOTAL) BY MOUTH DAILY. 15 tablet 0  . rivaroxaban (XARELTO) 20 MG TABS tablet Take 1 tablet (20 mg total) by mouth at bedtime. 30 tablet 6  . Azelastine-Fluticasone (DYMISTA) 137-50 MCG/ACT SUSP Place 2 sprays into both nostrils at bedtime. (Patient not taking: Reported on 04/06/2016) 1 Bottle 0  .  fluticasone (FLONASE) 50 MCG/ACT nasal spray 2 sprays each nostril once or twice daily every day while needed (Patient not taking: Reported on 04/06/2016) 16 g prn  . pantoprazole (PROTONIX) 40 MG tablet Take 1 tablet (40 mg total) by mouth daily. (Patient not taking: Reported on 04/06/2016) 30 tablet 0  . sucralfate (CARAFATE) 1 g tablet Take 1 tablet (1 g total) by mouth 4 (four) times daily -  with meals and at bedtime. (Patient not taking: Reported on 04/07/2016) 60 tablet 0  . traMADol (ULTRAM) 50 MG tablet 1 or 2 every 8 hours if needed for pain (Patient not taking: Reported on 04/07/2016) 50 tablet 0   No facility-administered medications prior to visit.      Allergies:   Shrimp; Banana; Watermelon flavor; Other; Peanut-containing drug products; Almond oil; and Sulfa antibiotics   Social History   Social History  . Marital Status: Single    Spouse Name: N/A  . Number of Children: 0  . Years of Education: N/A   Occupational History  . vac truck helper Unemployed   Social History Main Topics  . Smoking status: Current Every Day Smoker -- 0.10 packs/day for 10 years    Last Attempt to Quit: 10/17/2007  . Smokeless tobacco: Current User    Types: Snuff     Comment: smokes one pack per month  . Alcohol Use: Yes     Comment: occasionally - 1 beer 1x/mont  . Drug Use: No     Comment: cocaine in the past, none currently  . Sexual Activity: Not Asked   Other Topics Concern  . None   Social History Narrative     Family History:  The patient's family history includes Diabetes in his mother; Heart attack in his father; Stroke in his maternal grandmother and paternal grandmother.   ROS:   Please see the history of present illness.    Review of Systems  Constitution: Positive for malaise/fatigue.  Eyes: Positive for visual disturbance.  Respiratory: Positive for snoring.   Neurological: Positive for dizziness.  Psychiatric/Behavioral: Positive for depression. The patient is  nervous/anxious.    All other systems reviewed and are negative.   Physical Exam:    VS:  BP 140/98 mmHg  Pulse 72  Ht 5\' 9"  (1.753 m)  Wt 320 lb 6.4 oz (145.332 kg)  BMI 47.29 kg/m2   Physical Exam  Constitutional: He is oriented to person, place, and time. He appears well-developed and well-nourished.  HENT:  Head: Normocephalic.  Neck: No JVD present.  Cardiovascular: Normal rate, regular rhythm and normal heart sounds.   No murmur heard. Pulmonary/Chest: Effort normal and breath sounds normal. He has no wheezes. He has no rales.  Abdominal: Soft. There is no tenderness.  Musculoskeletal: He  exhibits no edema.  Neurological: He is alert and oriented to person, place, and time.  Skin: Skin is warm and dry.  Psychiatric: He has a normal mood and affect.    Wt Readings from Last 3 Encounters:  04/07/16 320 lb 6.4 oz (145.332 kg)  04/06/16 317 lb (143.79 kg)  11/02/15 317 lb (143.79 kg)      Studies/Labs Reviewed:     EKG:  EKG is  ordered today.  The ekg ordered today demonstrates NSR, HR 72, left axis deviation, septal Q waves, T-wave inversion V5-V6, QRS 78 ms, QTC 429 ms, no significant change compared to prior tracing dated 12/10/14  Recent Labs: 11/02/2015: ALT 22; BUN 10; Creatinine, Ser 0.96; Hemoglobin 14.8; Platelets 271; Potassium 4.3; Sodium 138   Recent Lipid Panel    Component Value Date/Time   CHOL 135 01/21/2013 0200   TRIG 135 01/21/2013 0200   HDL 20* 01/21/2013 0200   CHOLHDL 6.8 01/21/2013 0200   VLDL 27 01/21/2013 0200   LDLCALC 88 01/21/2013 0200    Additional studies/ records that were reviewed today include:   ETT-Myoview 3/16 Ex for 8", no ECG changes Overall Impression: Low risk stress nuclear study without reversible ischemia.  LV Ejection Fraction: 58%. LV Wall Motion: Normal Wall Motion   GXT 5/14: No ischemic EKG changes  Echocardiogram 4/14 - Moderate LVH. Systolic function was vigorous. EF 65-70%. Wall motion was normal.  Grade 2 diastolic dysfunction. - Aorta: Mildly dilated aortic root and ascending aorta. Ascending aorta dimension: 40 mm. Aortic root dimension: 38mm (ED). - Left atrium: The atrium was mildly dilated.   ASSESSMENT:     1. PAF (paroxysmal atrial fibrillation) (HCC)   2. Hypertensive heart disease without heart failure   3. Controlled type 2 diabetes mellitus without complication, without long-term current use of insulin (HCC)   4. Obstructive sleep apnea   5. Dilated aortic root (HCC)     PLAN:     In order of problems listed above:  1. Paroxysmal Atrial Fibrillation: Maintaining NSR.  He ran out of Flecainide 2 weeks ago.  He underwent ETT-Myoview last year that was low risk.  Resume Flecainide.  Continue Xarelto 20 mg QD, Toprol-XL 25 mg QD.  Check CMET, CBC today.   2. HTN: BP elevated today.  He has not taken any medications.  He has a hx of dizziness and visual changes when he strains.  Question if his BP is too high or too low.  I have asked him to get a BP machine and track his BP at home.  I would like him to check his BP at home when these episodes occur.  Adjust medications if needed.  Check CMET today.    3. Diabetes - FU with PCP for management.  He has not taken Lipitor in over a year.  I will get fasting Lipids today before resuming Lipitor.    4. OSA: Continue FU with Pulmonary. Continue CPAP.   5. Dilated aortic root - This was mild on prior echo.  Will discuss arranging FU echo at next visit in 3 mos.    Medication Adjustments/Labs and Tests Ordered: Current medicines are reviewed at length with the patient today.  Concerns regarding medicines are outlined above.  Medication changes, Labs and Tests ordered today are outlined in the Patient Instructions noted below. Patient Instructions  Medication Instructions:  No changes.   See your medication list. Start back on your Flecainide.  Labwork: Today - CBC, CMET, Lipids  Testing/Procedures:  None  today.  Follow-Up: Tereso Newcomer, PA-C ON 07/10/16 @ 8:15  Any Other Special Instructions Will Be Listed Below (If Applicable). Try to get a BP machine.  The arm cuff is the best.  Do not get a wrist cuff.  Make sure it fits your arm correctly. Check your BP once a day (1-2 hours after taking medications) 3-4 days a week and when you notice feeling dizzy and seeing bright lights. Call if BP is greater than 140/90 most of the time you take it or if it is every less than 100/60. Try to limit your salt.  Try to exercise - walking is the best thing you can do.  Try to lose weight.   If you need a refill on your cardiac medications before your next appointment, please call your pharmacy.    Signed, Tereso Newcomer, PA-C  04/07/2016 8:54 AM    [facility] [address] [place], [place], [place]  [address] Phone: [phone]; Fax: [phone]

## 2016-04-06 NOTE — Patient Instructions (Signed)
Change to nasal pillows for CPAP  Goal is to wear for at least 4 hr each night  Work on weight loss Do not drive if sleepy follow up Dr. Maple HudsonYoung  In 6 months and As needed

## 2016-04-06 NOTE — Addendum Note (Signed)
Addended by: Karalee HeightOX, Loron Weimer P on: 04/06/2016 04:33 PM   Modules accepted: Orders

## 2016-04-07 ENCOUNTER — Ambulatory Visit: Payer: Self-pay | Admitting: Adult Health

## 2016-04-07 ENCOUNTER — Encounter: Payer: Self-pay | Admitting: Physician Assistant

## 2016-04-07 ENCOUNTER — Ambulatory Visit (INDEPENDENT_AMBULATORY_CARE_PROVIDER_SITE_OTHER): Payer: 59 | Admitting: Physician Assistant

## 2016-04-07 VITALS — BP 140/98 | HR 72 | Ht 69.0 in | Wt 320.4 lb

## 2016-04-07 DIAGNOSIS — E119 Type 2 diabetes mellitus without complications: Secondary | ICD-10-CM | POA: Diagnosis not present

## 2016-04-07 DIAGNOSIS — I480 Paroxysmal atrial fibrillation: Secondary | ICD-10-CM

## 2016-04-07 DIAGNOSIS — G4733 Obstructive sleep apnea (adult) (pediatric): Secondary | ICD-10-CM | POA: Diagnosis not present

## 2016-04-07 DIAGNOSIS — I77810 Thoracic aortic ectasia: Secondary | ICD-10-CM

## 2016-04-07 DIAGNOSIS — I119 Hypertensive heart disease without heart failure: Secondary | ICD-10-CM | POA: Diagnosis not present

## 2016-04-07 DIAGNOSIS — I5032 Chronic diastolic (congestive) heart failure: Secondary | ICD-10-CM | POA: Diagnosis not present

## 2016-04-07 LAB — LIPID PANEL
CHOL/HDL RATIO: 4.7 ratio (ref <=5.0)
CHOLESTEROL: 155 mg/dL (ref 125–200)
HDL: 33 mg/dL — AB (ref >=40)
LDL CALC: 57 mg/dL (ref <130)
TRIGLYCERIDES: 323 mg/dL — AB (ref <150)
VLDL: 65 mg/dL — AB (ref <30)

## 2016-04-07 LAB — COMPREHENSIVE METABOLIC PANEL
ALK PHOS: 90 U/L (ref 40–115)
ALT: 27 U/L (ref 9–46)
AST: 23 U/L (ref 10–40)
Albumin: 3.9 g/dL (ref 3.6–5.1)
BILIRUBIN TOTAL: 0.4 mg/dL (ref 0.2–1.2)
BUN: 14 mg/dL (ref 7–25)
CO2: 23 mmol/L (ref 20–31)
Calcium: 9.1 mg/dL (ref 8.6–10.3)
Chloride: 101 mmol/L (ref 98–110)
Creat: 1.13 mg/dL (ref 0.60–1.35)
GLUCOSE: 390 mg/dL — AB (ref 65–99)
POTASSIUM: 4.7 mmol/L (ref 3.5–5.3)
Sodium: 133 mmol/L — ABNORMAL LOW (ref 135–146)
Total Protein: 7.1 g/dL (ref 6.1–8.1)

## 2016-04-07 LAB — CBC WITH DIFFERENTIAL/PLATELET
BASOS PCT: 1 %
Basophils Absolute: 73 cells/uL (ref 0–200)
EOS PCT: 6 %
Eosinophils Absolute: 438 cells/uL (ref 15–500)
HEMATOCRIT: 42.2 % (ref 38.5–50.0)
HEMOGLOBIN: 14.4 g/dL (ref 13.2–17.1)
LYMPHS ABS: 1533 {cells}/uL (ref 850–3900)
Lymphocytes Relative: 21 %
MCH: 28.2 pg (ref 27.0–33.0)
MCHC: 34.1 g/dL (ref 32.0–36.0)
MCV: 82.7 fL (ref 80.0–100.0)
MONO ABS: 876 {cells}/uL (ref 200–950)
MPV: 12 fL (ref 7.5–12.5)
Monocytes Relative: 12 %
NEUTROS ABS: 4380 {cells}/uL (ref 1500–7800)
NEUTROS PCT: 60 %
Platelets: 262 10*3/uL (ref 140–400)
RBC: 5.1 MIL/uL (ref 4.20–5.80)
RDW: 14.4 % (ref 11.0–15.0)
WBC: 7.3 10*3/uL (ref 3.8–10.8)

## 2016-04-07 MED ORDER — FLECAINIDE ACETATE 100 MG PO TABS: ORAL_TABLET | ORAL | Status: DC

## 2016-04-07 MED ORDER — RIVAROXABAN 20 MG PO TABS: 20.0000 mg | ORAL_TABLET | Freq: Every day | ORAL | Status: DC

## 2016-04-07 MED ORDER — METOPROLOL SUCCINATE ER 25 MG PO TB24: ORAL_TABLET | ORAL | Status: DC

## 2016-04-07 NOTE — Patient Instructions (Addendum)
Medication Instructions:  No changes.   See your medication list. Start back on your Flecainide.  Labwork: Today - CBC, CMET, Lipids  Testing/Procedures: None today.  Follow-Up: Tereso NewcomerScott Laquincy Eastridge, PA-C ON 07/10/16 @ 8:15  Any Other Special Instructions Will Be Listed Below (If Applicable). Try to get a BP machine.  The arm cuff is the best.  Do not get a wrist cuff.  Make sure it fits your arm correctly. Check your BP once a day (1-2 hours after taking medications) 3-4 days a week and when you notice feeling dizzy and seeing bright lights. Call if BP is greater than 140/90 most of the time you take it or if it is every less than 100/60. Try to limit your salt.  Try to exercise - walking is the best thing you can do.  Try to lose weight.   If you need a refill on your cardiac medications before your next appointment, please call your pharmacy.

## 2016-04-10 ENCOUNTER — Telehealth: Payer: Self-pay | Admitting: *Deleted

## 2016-04-10 DIAGNOSIS — E785 Hyperlipidemia, unspecified: Secondary | ICD-10-CM

## 2016-04-10 MED ORDER — PRAVASTATIN SODIUM 40 MG PO TABS: 40.0000 mg | ORAL_TABLET | Freq: Every evening | ORAL | Status: DC

## 2016-04-10 NOTE — Telephone Encounter (Signed)
Lmptcb to go over lab results 

## 2016-04-10 NOTE — Telephone Encounter (Signed)
Pt notified of lab results and is agreeable to plan of care f/u w/PCP about DM. Pt seeing PA 9/25 will get repeat LFT/FLP then .

## 2016-04-26 ENCOUNTER — Encounter: Payer: Self-pay | Admitting: Adult Health

## 2016-05-08 ENCOUNTER — Telehealth: Payer: Self-pay | Admitting: Internal Medicine

## 2016-05-08 MED ORDER — AMPHETAMINE-DEXTROAMPHET ER 30 MG PO CP24: 30.0000 mg | ORAL_CAPSULE | Freq: Every day | ORAL | 0 refills | Status: DC | PRN

## 2016-05-08 NOTE — Telephone Encounter (Signed)
Pt calling to request refill of the adderall.  Last script written on 03/30/16 for #30.  CY please advise of refills.    Last ov--08/18/15 Next ov--11/09/16

## 2016-05-08 NOTE — Telephone Encounter (Signed)
Called and spoke with pt and he will come tomorrow and pick up rx. Pt is aware.

## 2016-05-08 NOTE — Telephone Encounter (Signed)
Ok to refill 

## 2016-05-11 ENCOUNTER — Telehealth: Payer: Self-pay | Admitting: Internal Medicine

## 2016-05-11 NOTE — Telephone Encounter (Signed)
Called and spoke with pt and he stated that he does not have transportation at this time and requested that his rx be mailed to him.  This has been done.

## 2016-05-29 ENCOUNTER — Telehealth: Payer: Self-pay | Admitting: Internal Medicine

## 2016-05-29 NOTE — Telephone Encounter (Signed)
Spoke with pt. He reports his disability was denied bc he had nothing to show he is being treated for narcolepsy.  I advised him per last note with Dr. Maple HudsonYoung it shows narcolepsy as diagnosis. He reports he is getting a new lawyer and nothing is needed at this time.

## 2016-06-12 ENCOUNTER — Telehealth: Payer: Self-pay | Admitting: Internal Medicine

## 2016-06-12 NOTE — Telephone Encounter (Signed)
Pt would like to have pick up Rx for Adderall XR. SN is taking care of all of [name]'s messages this week-SN please advise if you are okay with Rx and signing it? Thanks.

## 2016-06-13 MED ORDER — AMPHETAMINE-DEXTROAMPHET ER 30 MG PO CP24: 30.0000 mg | ORAL_CAPSULE | Freq: Every day | ORAL | 0 refills | Status: DC | PRN

## 2016-06-13 NOTE — Telephone Encounter (Signed)
LMOM for pt that Dr Kriste BasqueNadel approved rx for Adderall.  Rx placed for Dr Kriste BasqueNadel to sign and will be left at front desk for pick up.

## 2016-06-13 NOTE — Telephone Encounter (Signed)
Per SN---  Ok to refill this medication for the pt.  thanks

## 2016-06-23 ENCOUNTER — Encounter: Payer: Self-pay | Admitting: Physician Assistant

## 2016-07-09 NOTE — Progress Notes (Deleted)
Cardiology Office Note:    Date:  07/10/2016   ID:  [name], DOB 1971/09/08, MRN [id number]  PCP:  Katy Apo, MD  Cardiologist: Dr. Everette Rank  Electrophysiologist: Dr. [name] (evaluated in [facility] in 2014)  Referring MD: Renford Dills, MD   No chief complaint on file. PAF, HTN *** History of Present Illness:    [name] is a 45 y.o. male with a hx of HTN, DM2, obesity, OSA on CPAP, prior ETOH/cocaine use and PAF. Remotely seen by Dr. Reyes Ivan in the past. He was evaluated by Dr. Delane Ginger in 4/14 in the hospital during an admission with PAF with RVR. 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 He converted to NSR and long-term anticoagulation was recommended given his stroke risk. Flecainide was added to maintain NSR. He was to follow up in this office for ETT to rule out pro-arrhythmia, but was not seen here until 2/16. Review of his chart indicates he had an ETT at [facility] with Dr. Everette Rank in 02/2013.  In 2/16, he was placed back on Flecainide and FU ETT-Myoview was low risk and neg for pro-arrhythmia.    CHADS2-VASc=2 (HTN, DM)   I saw him in 6/17 for FU.  He was out of Flecainide x 2 weeks.  This was resumed and he was asked to track his BP at home.  He returns for FU.  ***   Prior CV studies that were reviewed today include:    ETT-Myoview 3/16 Ex for 8", no ECG changes.  Low risk stress nuclear study without reversible ischemia.  LV Ejection Fraction: 58%.   GXT 5/14:  No ischemic EKG changes  Echocardiogram 4/14 Mod LVH, vigorous LVEF, EF 65-70%, no RWMA, Gr 2 DD, dilated aortic root and ascending aorta (ascending 40 mm, root 38 mm), mild LAE   Past Medical History:  Diagnosis Date  . Diabetes mellitus   . History of alcohol abuse   . History of cocaine use   . History of echocardiogram    a. Echo 4/14: Moderate  LVH, vigorous LVEF, EF 65-70%, normal wall motion, grade 2 diastolic dysfunction, mildly dilated aortic root and ascending aorta, ascending aorta 40 mm, aortic root 38 mm, mild LAE  . Hx of cardiovascular stress test    a. GXT 5/14: No ischemic changes  //  b. ETT-Myoview 3/16:  Low risk, no ischemia, EF 58%  . Hypertension   . Morbid obesity (HCC)   . Obesity   . Paroxysmal atrial fibrillation (HCC)    Occurring in 2008, with several recurrence since then (including in the setting of + cocaine on UDS).  . Sleep apnea     Past Surgical History:  Procedure Laterality Date  . APPENDECTOMY    . ROTATOR CUFF REPAIR    . TONSILLECTOMY      Current Medications: No outpatient prescriptions have been marked as taking for the 07/10/16 encounter (Appointment) with Beatrice Lecher, PA-C.       Allergies:   Shrimp [shellfish allergy]; Banana; Watermelon flavor; Other; Peanut-containing drug products; Almond oil; and Sulfa antibiotics   Social History   Social History  . Marital status: Single    Spouse name: N/A  . Number of children: 0  . Years of education: N/A   Occupational History  . vac truck helper Unemployed   Social History Main Topics  . Smoking status:  Current Every Day Smoker    Packs/day: 0.10    Years: 10.00    Last attempt to quit: 10/17/2007  . Smokeless tobacco: Current User    Types: Snuff     Comment: smokes one pack per month  . Alcohol use Yes     Comment: occasionally - 1 beer 1x/mont  . Drug use: No     Comment: cocaine in the past, none currently  . Sexual activity: Not on file   Other Topics Concern  . Not on file   Social History Narrative  . No narrative on file     Family History:  The patient's ***family history includes Diabetes in his mother; Heart attack in his father; Stroke in his maternal grandmother and paternal grandmother.   ROS:   Please see the history of present illness.    ROS All other systems reviewed and are  negative.   EKGs/Labs/Other Test Reviewed:    EKG:  EKG is *** ordered today.  The ekg ordered today demonstrates ***  Recent Labs: 04/07/2016: ALT 27; BUN 14; Creat 1.13; Hemoglobin 14.4; Platelets 262; Potassium 4.7; Sodium 133   Recent Lipid Panel    Component Value Date/Time   CHOL 155 04/07/2016 0851   TRIG 323 (H) 04/07/2016 0851   HDL 33 (L) 04/07/2016 0851   CHOLHDL 4.7 04/07/2016 0851   VLDL 65 (H) 04/07/2016 0851   LDLCALC 57 04/07/2016 0851     Physical Exam:    VS:  There were no vitals taken for this visit.    Wt Readings from Last 3 Encounters:  04/07/16 (!) 320 lb 6.4 oz (145.3 kg)  04/06/16 (!) 317 lb (143.8 kg)  11/02/15 (!) 317 lb (143.8 kg)     ***Physical Exam  ASSESSMENT:    1. PAF (paroxysmal atrial fibrillation) (HCC)   2. Hypertensive heart disease without heart failure   3. Dyslipidemia   4. Obstructive sleep apnea   5. Dilated aortic root (HCC)   6. Morbid obesity due to excess calories (HCC)    PLAN:    In order of problems listed above:  1. PAF - ***  -  *** Flecainide, Toprol-XL, Xarelto  -  ***  2. HTN - *** He has noted that his BP has been low in the past resulting in reduction of his medication dosages.  ***  3. HL - Trigs elevated in 6/17 at 323.  *** Pravastatin.  -  *** Lipids and LFTs.  4. OSA: Continue FU with Pulmonary. Continue CPAP.   5. Dilated aortic root - This was mild on prior echo.  Will discuss arranging FU echo at next visit in 3 mos.   6. Obesity - ***  Medication Adjustments/Labs and Tests Ordered: Current medicines are reviewed at length with the patient today.  Concerns regarding medicines are outlined above.  Medication changes, Labs and Tests ordered today are outlined in the Patient Instructions noted below. There are no Patient Instructions on file for this visit. Signed, Tereso NewcomerScott Weaver, PA-C  07/10/2016 8:05 AM    [facility] [address], [place], [place]   [address] Phone: [phone]; Fax: [phone]

## 2016-07-10 ENCOUNTER — Other Ambulatory Visit: Payer: Self-pay

## 2016-07-10 ENCOUNTER — Ambulatory Visit: Payer: Self-pay | Admitting: Physician Assistant

## 2016-07-11 ENCOUNTER — Telehealth: Payer: Self-pay | Admitting: Internal Medicine

## 2016-07-11 NOTE — Telephone Encounter (Signed)
Ok to refill 

## 2016-07-11 NOTE — Telephone Encounter (Signed)
Requesting Adderall refill. Last refill: 06/13/16 Last OV: 04/06/16 w/ TP Next OV: 11/09/16  Ok to refill?  Current Outpatient Prescriptions on File Prior to Visit  Medication Sig Dispense Refill  . amphetamine-dextroamphetamine (ADDERALL XR) 30 MG 24 hr capsule Take 1 capsule (30 mg total) by mouth daily as needed (For narcolepsy.). 30 capsule 0  . flecainide (TAMBOCOR) 100 MG tablet TAKE 1 TABLET (100 MG TOTAL) BY MOUTH 2 (TWO) TIMES DAILY. 30 tablet 11  . glipiZIDE (GLUCOTROL) 10 MG 24 hr tablet Take 10 mg by mouth 2 (two) times daily.     Marland Kitchen. LANTUS SOLOSTAR 100 UNIT/ML injection Inject 20 Units into the skin at bedtime.     Marland Kitchen. lisinopril-hydrochlorothiazide (PRINZIDE,ZESTORETIC) 10-12.5 MG per tablet Take 1 tablet by mouth at bedtime.     . metFORMIN (GLUCOPHAGE) 500 MG tablet Take 1,000 mg by mouth 2 (two) times daily.     . metoprolol succinate (TOPROL-XL) 25 MG 24 hr tablet TAKE 1 TABLET (25 MG TOTAL) BY MOUTH DAILY. 30 tablet 11  . pravastatin (PRAVACHOL) 40 MG tablet Take 1 tablet (40 mg total) by mouth every evening. 90 tablet 3  . rivaroxaban (XARELTO) 20 MG TABS tablet Take 1 tablet (20 mg total) by mouth at bedtime. 30 tablet 11   No current facility-administered medications on file prior to visit.    Allergies  Allergen Reactions  . Shrimp [Shellfish Allergy] Anaphylaxis  . Banana Other (See Comments)    Pt gags  . Watermelon Flavor Nausea And Vomiting  . Other Itching    Grapes  . Peanut-Containing Drug Products Itching and Cough  . Almond Oil Itching    Roof of mouth itches  . Sulfa Antibiotics Itching

## 2016-07-12 MED ORDER — AMPHETAMINE-DEXTROAMPHET ER 30 MG PO CP24: 30.0000 mg | ORAL_CAPSULE | Freq: Every day | ORAL | 0 refills | Status: DC | PRN

## 2016-07-12 NOTE — Telephone Encounter (Signed)
Pt returned call, I let him know that rx was ready for pick-up.Caren GriffinsStanley A Dalton

## 2016-07-12 NOTE — Telephone Encounter (Signed)
RX placed for pick up. LMTCB x1 for pt

## 2016-07-12 NOTE — Telephone Encounter (Signed)
Rx has been printed and placed on CY's cart to be signed. [facility].

## 2016-07-14 ENCOUNTER — Encounter: Payer: Self-pay | Admitting: Internal Medicine

## 2016-07-14 ENCOUNTER — Encounter: Payer: Self-pay | Admitting: Physician Assistant

## 2016-08-10 ENCOUNTER — Telehealth: Payer: Self-pay | Admitting: Internal Medicine

## 2016-08-11 ENCOUNTER — Telehealth: Payer: Self-pay | Admitting: Internal Medicine

## 2016-08-11 MED ORDER — AMPHETAMINE-DEXTROAMPHET ER 30 MG PO CP24: 30.0000 mg | ORAL_CAPSULE | Freq: Every day | ORAL | 0 refills | Status: DC | PRN

## 2016-08-11 NOTE — Telephone Encounter (Signed)
Folder placed on CY's cart to complete.

## 2016-08-11 NOTE — Telephone Encounter (Signed)
rx has been printed and I will have CY sing this rx.  Will call the pt once this is ready to pick up.

## 2016-08-11 NOTE — Telephone Encounter (Signed)
Packet given to [place] to return to [name].

## 2016-08-11 NOTE — Telephone Encounter (Signed)
Pt is calling for refill of the adderall .  CY please advise if ok to refill for the pt.    Last ov--04/06/2016 Next ov--11/09/2016  Last refill was given on 07/12/2016 for 30 tablets. Thanks  Allergies  Allergen Reactions  . Shrimp [Shellfish Allergy] Anaphylaxis  . Banana Other (See Comments)    Pt gags  . Watermelon Flavor Nausea And Vomiting  . Other Itching    Grapes  . Peanut-Containing Drug Products Itching and Cough  . Almond Oil Itching    Roof of mouth itches  . Sulfa Antibiotics Itching

## 2016-08-11 NOTE — Telephone Encounter (Signed)
Rec'd disability paperwork back - fwd to [employer] via interoffice mail - 08/11/16-pr

## 2016-08-11 NOTE — Telephone Encounter (Signed)
Ok to refill 

## 2016-08-11 NOTE — Telephone Encounter (Signed)
Disability form done 

## 2016-08-14 MED ORDER — AMPHETAMINE-DEXTROAMPHET ER 30 MG PO CP24: 30.0000 mg | ORAL_CAPSULE | Freq: Every day | ORAL | 0 refills | Status: DC | PRN

## 2016-08-14 NOTE — Telephone Encounter (Signed)
Patient is asking that he be called as soon as this RX is ready to be picked up.  CB is [phone].  He is out of medication and wants to pick up today.

## 2016-08-14 NOTE — Telephone Encounter (Signed)
rx is not in pick-up box or outgoing mail. I did not see this rx on CY's cart. KW please advise if this rx has been given to you.  Thanks!

## 2016-08-14 NOTE — Telephone Encounter (Signed)
Sorry, I do not nor have not seen Rx for patient.

## 2016-08-14 NOTE — Telephone Encounter (Signed)
KW please advise if you have this rx.  Thanks

## 2016-08-14 NOTE — Telephone Encounter (Signed)
Also unable to locate Rx Called spoke with patient, apologized for this inconvenience.  Pt very understanding and he would still like to pick up Rx, he is now out of his medication  Rx reprinted for CY to sign tomorrow Given to Katie to help ensure this is taken care of

## 2016-08-15 NOTE — Telephone Encounter (Signed)
Called and spoke with pt and he is aware of rx that is ready to be picked up.

## 2016-08-15 NOTE — Telephone Encounter (Signed)
Ok to refill 

## 2016-09-14 ENCOUNTER — Telehealth: Payer: Self-pay | Admitting: Internal Medicine

## 2016-09-14 NOTE — Telephone Encounter (Signed)
Pt requesting a refill on Adderall XR 30mg . Pt wishes to pick up rx in office when ready.  Last refill: 08/14/16 #30 with 0 refills, take qd prn for narcolepsy. Last OV: 04/06/16 Next OV: 11/09/16  CY please advise on refill.  Thanks.

## 2016-09-15 MED ORDER — AMPHETAMINE-DEXTROAMPHET ER 30 MG PO CP24: 30.0000 mg | ORAL_CAPSULE | Freq: Every day | ORAL | 0 refills | Status: DC | PRN

## 2016-09-15 NOTE — Telephone Encounter (Signed)
Ok to refill 

## 2016-09-15 NOTE — Telephone Encounter (Signed)
lmtcb X1 for pt to make aware that rx is ready for pickup. rx left in brown accordion folder up front.

## 2016-09-15 NOTE — Telephone Encounter (Signed)
rx has been printed out and placed on CY cart to be signed.  Will call pt once this is ready.  

## 2016-10-03 ENCOUNTER — Emergency Department (HOSPITAL_COMMUNITY)
Admission: EM | Admit: 2016-10-03 | Discharge: 2016-10-03 | Disposition: A | Discharge status: Home / Self Care | Payer: 59 | Attending: Emergency Medicine | Admitting: Emergency Medicine

## 2016-10-03 ENCOUNTER — Encounter (HOSPITAL_COMMUNITY): Payer: Self-pay

## 2016-10-03 DIAGNOSIS — Z7984 Long term (current) use of oral hypoglycemic drugs: Secondary | ICD-10-CM | POA: Insufficient documentation

## 2016-10-03 DIAGNOSIS — S8991XA Unspecified injury of right lower leg, initial encounter: Secondary | ICD-10-CM | POA: Diagnosis present

## 2016-10-03 DIAGNOSIS — Y999 Unspecified external cause status: Secondary | ICD-10-CM | POA: Insufficient documentation

## 2016-10-03 DIAGNOSIS — Z79899 Other long term (current) drug therapy: Secondary | ICD-10-CM | POA: Insufficient documentation

## 2016-10-03 DIAGNOSIS — Y9389 Activity, other specified: Secondary | ICD-10-CM | POA: Insufficient documentation

## 2016-10-03 DIAGNOSIS — I5032 Chronic diastolic (congestive) heart failure: Secondary | ICD-10-CM | POA: Insufficient documentation

## 2016-10-03 DIAGNOSIS — W28XXXA Contact with powered lawn mower, initial encounter: Secondary | ICD-10-CM | POA: Diagnosis not present

## 2016-10-03 DIAGNOSIS — S86911A Strain of unspecified muscle(s) and tendon(s) at lower leg level, right leg, initial encounter: Secondary | ICD-10-CM | POA: Diagnosis not present

## 2016-10-03 DIAGNOSIS — Y92009 Unspecified place in unspecified non-institutional (private) residence as the place of occurrence of the external cause: Secondary | ICD-10-CM | POA: Diagnosis not present

## 2016-10-03 DIAGNOSIS — Z91010 Allergy to peanuts: Secondary | ICD-10-CM | POA: Insufficient documentation

## 2016-10-03 DIAGNOSIS — I110 Hypertensive heart disease with heart failure: Secondary | ICD-10-CM | POA: Insufficient documentation

## 2016-10-03 DIAGNOSIS — E119 Type 2 diabetes mellitus without complications: Secondary | ICD-10-CM | POA: Insufficient documentation

## 2016-10-03 DIAGNOSIS — Z7901 Long term (current) use of anticoagulants: Secondary | ICD-10-CM | POA: Insufficient documentation

## 2016-10-03 DIAGNOSIS — F17200 Nicotine dependence, unspecified, uncomplicated: Secondary | ICD-10-CM | POA: Diagnosis not present

## 2016-10-03 MED ORDER — TRAMADOL HCL 50 MG PO TABS: 50.0000 mg | ORAL_TABLET | Freq: Four times a day (QID) | ORAL | 0 refills | Status: DC | PRN

## 2016-10-03 NOTE — Discharge Instructions (Signed)
If your symptoms persist follow up with Dr. Roda ShuttersXu. Return here as needed.

## 2016-10-03 NOTE — ED Triage Notes (Signed)
Per Pt, Pt is coming from home with complaints of straining right calf while trying to stop the lawnmower from rolling down the hill. Pt reports pain with walking.

## 2016-10-03 NOTE — ED Notes (Signed)
ED Provider at bedside. 

## 2016-10-03 NOTE — ED Provider Notes (Signed)
[facility]-EMERGENCY DEPT Provider Note   CSN: [id number] Arrival date & time: 10/03/16  1522 By signing my name below, I, Bridgette Habermann, attest that this documentation has been prepared under the direction and in the presence of Kerrie Buffalo, [place]. Electronically Signed: Bridgette Habermann, ED Scribe. . 4:19 PM.   History   Chief Complaint Chief Complaint  Patient presents with  . Leg Pain   HPI Comments: [name] is a 45 y.o. male with h/o DM, HTN, and CHF, who presents to the Emergency Department complaining of right calf pain s/p mechanical injury this morning. Pt reports he "strained" his right calf while trying to stop a lawnmower from rolling down a hill. Pain is exacerbated with ambulating and bearing weight to the area. He has not tried any OTC medications PTA. Pt denies fever, chills, or any other associated symptoms.   The history is provided by the patient. No language interpreter was used.    Past Medical History:  Diagnosis Date  . Diabetes mellitus   . History of alcohol abuse   . History of cocaine use   . History of echocardiogram    a. Echo 4/14: Moderate LVH, vigorous LVEF, EF 65-70%, normal wall motion, grade 2 diastolic dysfunction, mildly dilated aortic root and ascending aorta, ascending aorta 40 mm, aortic root 38 mm, mild LAE  . Hx of cardiovascular stress test    a. GXT 5/14: No ischemic changes  //  b. ETT-Myoview 3/16:  Low risk, no ischemia, EF 58%  . Hypertension   . Morbid obesity (HCC)   . Obesity   . Paroxysmal atrial fibrillation (HCC)    Occurring in 2008, with several recurrence since then (including in the setting of + cocaine on UDS).  . Sleep apnea     Patient Active Problem List   Diagnosis Date Noted  . PAF (paroxysmal atrial fibrillation) (HCC) 01/23/2013  . Diabetes mellitus (HCC) 01/23/2013  . Hypertensive heart disease 01/23/2013  . Tobacco abuse, hx of 01/23/2013  . Chronic diastolic heart failure (HCC) 01/21/2013  . 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 Shoulder pain, right 02/15/2011  . Seasonal and perennial allergic rhinitis 02/13/2008  . Dyslipidemia 02/12/2008  . OBESITY, MORBID 02/12/2008  . COCAINE Abuse, past hx 02/12/2008  . Obstructive sleep apnea 02/12/2008    Past Surgical History:  Procedure Laterality Date  . APPENDECTOMY    . ROTATOR CUFF REPAIR    . TONSILLECTOMY         Home Medications    Prior to Admission medications   Medication Sig Start Date End Date Taking? Authorizing Provider  amphetamine-dextroamphetamine (ADDERALL XR) 30 MG 24 hr capsule Take 1 capsule (30 mg total) by mouth daily as needed (For narcolepsy.). 09/15/16   Waymon Budge, MD  flecainide (TAMBOCOR) 100 MG tablet TAKE 1 TABLET (100 MG TOTAL) BY MOUTH 2 (TWO) TIMES DAILY. 04/07/16   Beatrice Lecher, PA-C  glipiZIDE (GLUCOTROL) 10 MG 24 hr tablet Take 10 mg by mouth 2 (two) times daily.  02/28/11   Historical Provider, MD  LANTUS SOLOSTAR 100 UNIT/ML injection Inject 20 Units into the skin at bedtime.  04/22/12   Historical Provider, MD  lisinopril-hydrochlorothiazide (PRINZIDE,ZESTORETIC) 10-12.5 MG per tablet Take 1 tablet by mouth at bedtime.     Historical Provider, MD  metFORMIN (GLUCOPHAGE) 500 MG tablet Take 1,000 mg by mouth 2 (two) times daily.     Historical Provider, MD  metoprolol succinate (TOPROL-XL) 25 MG 24 hr tablet TAKE 1  TABLET (25 MG TOTAL) BY MOUTH DAILY. 04/07/16   Beatrice LecherScott T Weaver, PA-C  pravastatin (PRAVACHOL) 40 MG tablet Take 1 tablet (40 mg total) by mouth every evening. 04/10/16   Beatrice LecherScott T Weaver, PA-C  rivaroxaban (XARELTO) 20 MG TABS tablet Take 1 tablet (20 mg total) by mouth at bedtime. 04/07/16   Beatrice LecherScott T Weaver, PA-C  traMADol (ULTRAM) 50 MG tablet Take 1 tablet (50 mg total) by mouth every 6 (six) hours as needed. 10/03/16   Hope Orlene OchM Neese, NP    Family History Family History  Problem Relation Age of Onset  . Diabetes Mother   . Heart attack Father   . Stroke Maternal Grandmother   . Stroke  Paternal Grandmother     Social History Social History  Substance Use Topics  . Smoking status: Current Every Day Smoker    Packs/day: 0.10    Years: 10.00    Last attempt to quit: 10/17/2007  . Smokeless tobacco: Current User    Types: Snuff     Comment: smokes one pack per month  . Alcohol use Yes     Comment: occasionally - 1 beer 1x/mont     Allergies   Shrimp [shellfish allergy]; Banana; Watermelon flavor; Other; Peanut-containing drug products; Almond oil; and Sulfa antibiotics   Review of Systems Review of Systems  Constitutional: Negative for chills and fever.  Musculoskeletal: Positive for arthralgias.       Right calf pain  All other systems reviewed and are negative.  Physical Exam Updated Vital Signs BP 123/86   Pulse 108   Temp 98.2 F (36.8 C) (Oral)   Resp 18   Ht 5\' 9"  (1.753 m)   Wt (!) 143.8 kg   SpO2 97%   BMI 46.81 kg/m   Physical Exam  Constitutional: He appears well-developed and well-nourished. No distress.  HENT:  Head: Normocephalic and atraumatic.  Eyes: Conjunctivae and EOM are normal.  Neck: Neck supple.  Cardiovascular: Normal rate.   Pulmonary/Chest: Effort normal. No respiratory distress.  Abdominal: He exhibits no distension.  Musculoskeletal: Normal range of motion. He exhibits edema. He exhibits no deformity.       Right ankle: He exhibits no swelling and no deformity. Achilles tendon normal. Achilles tendon exhibits no pain, no defect and normal Thompson's test results.       Right lower leg: He exhibits tenderness and swelling.       Legs: Pedal pulse 2+. Swelling to the right calf.tenderness to the medial aspect of the right calf.  Compartment soft. Negative Thompson's test. No defect over the Achilles' tendon.  Neurological: He is alert.  Skin: Skin is warm and dry.  Psychiatric: He has a normal mood and affect. His behavior is normal.  Nursing note and vitals reviewed.    ED Treatments / Results  DIAGNOSTIC  STUDIES: Oxygen Saturation is 97% on RA, adequate by my interpretation.    COORDINATION OF CARE: 4:17 PM Discussed treatment plan with pt at bedside and pt agreed to plan.  Labs (all labs ordered are listed, but only abnormal results are displayed) Labs Reviewed - No data to display  Radiology No results found.  Procedures Procedures (including critical care time)  Medications Ordered in ED Medications - No data to display   Initial Impression / Assessment and Plan / ED Course  I have reviewed the triage vital signs and the nursing notes.  Clinical Course   I discussed this case with Dr. Penne LashIssacs due to the patient being on  Xarelto   Dr. Penne LashIssacs in to examine the patient and agrees with assessment/plan. Ace wrap applied, ice, elevation and follow up with ortho if symptoms persist. Stable for d/c without signs of compartment syndrome.   Final Clinical Impressions(s) / ED Diagnoses   Final diagnoses:  Muscle strain of right lower leg, initial encounter    New Prescriptions New Prescriptions   TRAMADOL (ULTRAM) 50 MG TABLET    Take 1 tablet (50 mg total) by mouth every 6 (six) hours as needed.   I personally performed the services described in this documentation, which was scribed in my presence. The recorded information has been reviewed and is accurate.     [address]Hope Mond[address]M Neese, NP 10/03/16 1636    Shaune Pollackameron Isaacs, MD 10/04/16 [phone]

## 2016-10-20 ENCOUNTER — Telehealth: Payer: Self-pay | Admitting: Internal Medicine

## 2016-10-20 MED ORDER — AMPHETAMINE-DEXTROAMPHET ER 30 MG PO CP24: 30.0000 mg | ORAL_CAPSULE | Freq: Every day | ORAL | 0 refills | Status: DC | PRN

## 2016-10-20 NOTE — Telephone Encounter (Signed)
Ok to refill 

## 2016-10-20 NOTE — Telephone Encounter (Signed)
CY  Please Advise-   Pt. Called in wanting to know if he could have a refill of his Adderall 30mg . He is requesting to pcik this up.  Last Filled 09/15/16 Last Ov:04/06/16 Next OV 11/09/16  amphetamine-dextroamphetamine (ADDERALL XR) 30 MG 24 hr capsule [[id number]][[id number]]  Order Details  Dose: 30 mg Route: Oral Frequency: Daily PRN for For narcolepsy.  Dispense Quantity:  30 capsule Refills:  0 Fills remaining:  --        Sig: Take 1 capsule (30 mg total) by mouth daily as needed (For narcolepsy.).       Written Date:  09/15/16 Expiration Date:  11/14/16    Start Date:  09/15/16 End Date:  --

## 2016-10-20 NOTE — Telephone Encounter (Signed)
Pt. Phone was off, tried another number women who picked up the phone stated she was not home right now for him to use her phone, I asked her to just inform the pt. That his rx was up front for pick up and he could call us if he had further questions. Nothing further is needed at this time.

## 2016-10-20 NOTE — Telephone Encounter (Signed)
Rx was printed and laid on CY's cart, will hold message in Triage until it is signed, pt. Was not called yet either

## 2016-11-09 ENCOUNTER — Ambulatory Visit: Payer: Self-pay | Admitting: Internal Medicine

## 2016-11-09 DIAGNOSIS — G4733 Obstructive sleep apnea (adult) (pediatric): Secondary | ICD-10-CM | POA: Diagnosis not present

## 2016-11-20 ENCOUNTER — Telehealth: Payer: Self-pay | Admitting: Internal Medicine

## 2016-11-20 MED ORDER — AMPHETAMINE-DEXTROAMPHET ER 30 MG PO CP24: 30.0000 mg | ORAL_CAPSULE | Freq: Every day | ORAL | 0 refills | Status: DC | PRN

## 2016-11-20 NOTE — Telephone Encounter (Signed)
Pt wanting to know when he can pick this up [phone]

## 2016-11-20 NOTE — Telephone Encounter (Signed)
Ok to refill 

## 2016-11-20 NOTE — Telephone Encounter (Signed)
rx printed and placed up front for pickup.  Pt aware.  Nothing further needed.  

## 2016-11-20 NOTE — Telephone Encounter (Signed)
Please advise on adderall refill  Pt last seen by CDY 09/10/15 and was seen by TP 04/06/16  He has appt pending on 01/09/16 with CDY  Last rx # 30 on 10/20/16

## 2016-12-08 ENCOUNTER — Encounter: Payer: Self-pay | Admitting: Family

## 2016-12-08 ENCOUNTER — Other Ambulatory Visit (INDEPENDENT_AMBULATORY_CARE_PROVIDER_SITE_OTHER): Payer: 59

## 2016-12-08 ENCOUNTER — Ambulatory Visit (INDEPENDENT_AMBULATORY_CARE_PROVIDER_SITE_OTHER): Payer: 59 | Admitting: Family

## 2016-12-08 VITALS — BP 150/98 | HR 88 | Temp 97.9°F | Resp 16 | Ht 69.0 in | Wt 299.4 lb

## 2016-12-08 DIAGNOSIS — E669 Obesity, unspecified: Secondary | ICD-10-CM | POA: Insufficient documentation

## 2016-12-08 DIAGNOSIS — Z794 Long term (current) use of insulin: Secondary | ICD-10-CM | POA: Diagnosis not present

## 2016-12-08 DIAGNOSIS — I480 Paroxysmal atrial fibrillation: Secondary | ICD-10-CM

## 2016-12-08 DIAGNOSIS — E6609 Other obesity due to excess calories: Secondary | ICD-10-CM

## 2016-12-08 DIAGNOSIS — IMO0001 Reserved for inherently not codable concepts without codable children: Secondary | ICD-10-CM

## 2016-12-08 DIAGNOSIS — E119 Type 2 diabetes mellitus without complications: Secondary | ICD-10-CM | POA: Diagnosis not present

## 2016-12-08 DIAGNOSIS — E785 Hyperlipidemia, unspecified: Secondary | ICD-10-CM

## 2016-12-08 DIAGNOSIS — I119 Hypertensive heart disease without heart failure: Secondary | ICD-10-CM | POA: Diagnosis not present

## 2016-12-08 DIAGNOSIS — Z6841 Body Mass Index (BMI) 40.0 and over, adult: Secondary | ICD-10-CM

## 2016-12-08 LAB — COMPREHENSIVE METABOLIC PANEL
ALT: 28 U/L (ref 0–53)
AST: 21 U/L (ref 0–37)
Albumin: 4.1 g/dL (ref 3.5–5.2)
Alkaline Phosphatase: 99 U/L (ref 39–117)
BUN: 15 mg/dL (ref 6–23)
CHLORIDE: 99 meq/L (ref 96–112)
CO2: 25 meq/L (ref 19–32)
CREATININE: 0.97 mg/dL (ref 0.40–1.50)
Calcium: 9.2 mg/dL (ref 8.4–10.5)
GFR: 107.12 mL/min (ref >60.00)
Glucose, Bld: 411 mg/dL — ABNORMAL HIGH (ref 70–99)
POTASSIUM: 4 meq/L (ref 3.5–5.1)
SODIUM: 132 meq/L — AB (ref 135–145)
Total Bilirubin: 0.8 mg/dL (ref 0.2–1.2)
Total Protein: 7.8 g/dL (ref 6.0–8.3)

## 2016-12-08 LAB — LIPID PANEL
CHOL/HDL RATIO: 7
CHOLESTEROL: 224 mg/dL — AB (ref 0–200)
HDL: 32.5 mg/dL — ABNORMAL LOW (ref >39.00)
Triglycerides: 435 mg/dL — ABNORMAL HIGH (ref 0.0–149.0)

## 2016-12-08 LAB — MICROALBUMIN / CREATININE URINE RATIO
CREATININE, U: 92.7 mg/dL
MICROALB UR: 18.5 mg/dL — AB (ref 0.0–1.9)
Microalb Creat Ratio: 20 mg/g (ref 0.0–30.0)

## 2016-12-08 LAB — HEMOGLOBIN A1C: HEMOGLOBIN A1C: 13.3 % — AB (ref 4.6–6.5)

## 2016-12-08 LAB — LDL CHOLESTEROL, DIRECT: LDL DIRECT: 103 mg/dL

## 2016-12-08 MED ORDER — INSULIN PEN NEEDLE 32G X 4 MM MISC: 1 refills | Status: DC

## 2016-12-08 MED ORDER — ONETOUCH ULTRA MINI W/DEVICE KIT: PACK | 0 refills | Status: DC

## 2016-12-08 MED ORDER — ONETOUCH SURESOFT LANCING DEV MISC: 1 refills | Status: DC

## 2016-12-08 MED ORDER — BASAGLAR KWIKPEN 100 UNIT/ML ~~LOC~~ SOPN: 20.0000 [IU] | PEN_INJECTOR | Freq: Every day | SUBCUTANEOUS | 0 refills | Status: DC

## 2016-12-08 MED ORDER — GLUCOSE BLOOD VI STRP: ORAL_STRIP | 12 refills | Status: DC

## 2016-12-08 NOTE — Assessment & Plan Note (Signed)
Type 2 diabetes most likely poorly controlled with blood sugars are not checked at home and no medication 1 month secondary to running out of medication. Obtain hemoglobin A1c, CMET and urine microalbumin. Sample of basglar provided to restart insulin. Refills of glipizide and metformin. Diabetic foot exam completed today. Due for diabetic eye exam encouraged to be completed independently. Pravastatin for CAD risk reduction with no current Ace/ARB. Pneumovax is up-to-date. Encouraged to monitor blood sugars at home and follow carbohydrate diet. Declines diabetic education. Follow-up in one month or sooner pending A1c results.

## 2016-12-08 NOTE — Patient Instructions (Addendum)
Thank you for choosing [facility].  SUMMARY AND INSTRUCTIONS:  Medication:  Please continue to take your medications as prescribed  Start Basaglar 20 units daily.  Please check the price of the following medications:  Trulicity Bydureon Basaglar Lantus Tujeo Farxiga Jardiance Invokana  Your prescription(s) have been submitted to your pharmacy or been printed and provided for you. Please take as directed and contact our office if you believe you are having problem(s) with the medication(s) or have any questions.  Labs:  Please stop by the lab on the lower level of the building for your blood work. Your results will be released to MyChart (or called to you) after review, usually within 72 hours after test completion. If any changes need to be made, you will be notified at that same time.  1.) The lab is open from 7:30am to 5:30 pm Monday-Friday 2.) No appointment is necessary 3.) Fasting (if needed) is 6-8 hours after food and drink; black coffee and water are okay   Follow up:  If your symptoms worsen or fail to improve, please contact our office for further instruction, or in case of emergency go directly to the emergency room at the closest medical facility.

## 2016-12-08 NOTE — Assessment & Plan Note (Signed)
Obtain lipid profile. Continue current dosage of pravastatin pending lipid profile results. 

## 2016-12-08 NOTE — Progress Notes (Signed)
Subjective:    Patient ID: [name], male    DOB: 07/05/1971, 46 y.o.   MRN: [id number]  Chief Complaint  Patient presents with  . Establish Care    needs refill on all medications    HPI:  [name] is a 46 y.o. male who  has a past medical history of Diabetes mellitus; GERD (gastroesophageal reflux disease); History of alcohol abuse; History of cocaine use; History of echocardiogram; cardiovascular stress test; Hyperlipidemia; Hypertension; Morbid obesity ([place]); Obesity; Paroxysmal atrial fibrillation ([place]); and Sleep apnea. and presents today for an office visit to establish care.  1.) Type 2 diabetes - currently maintained on metformin, glipizide, and Lantus. Taking 20 units of Lantus nightly. Reports that he has been out of his diabetes medications for about 1 month. When taking them denies adverse side effects or hypoglycemic readings. . Not currently checking blood sugars at home. Pneumovax is up-to-date. Due for diabetic eye and foot exam. Not currently following a low carbohydrate diet. No regular structured physical.   Lab Results  Component Value Date   HGBA1C 13.3 (H) 12/08/2016    2.) Hypertension - currently maintained on lisinopril-hydrochlorothiazide and metoprolol. Reports taking the medication as prescribed and denies adverse side effects. Denies worse headache of life with no new symptoms of end organ damage. Not currently checking blood pressures at home or following a low sodium diet.    BP Readings from Last 3 Encounters:  12/08/16 (!) 150/98  10/03/16 123/86  04/07/16 (!) 140/98    3.) Paroxysmal atrial fibrillation - currently maintained on flecainide and Xarelto. Reports taken the medications as prescribed and denies adverse side effects or nuisance bleeding. No chest pain, shortness of breath, or heart palpitations.   Allergies  Allergen Reactions  . Shrimp [Shellfish Allergy] Anaphylaxis  . Banana Other (See Comments)    Pt  gags  . Watermelon Flavor Nausea And Vomiting  . Other Itching    Grapes  . Peanut-Containing Drug Products Itching and Cough  . Almond Oil Itching    Roof of mouth itches  . Sulfa Antibiotics Itching      Outpatient Medications Prior to Visit  Medication Sig Dispense Refill  . amphetamine-dextroamphetamine (ADDERALL XR) 30 MG 24 hr capsule Take 1 capsule (30 mg total) by mouth daily as needed (For narcolepsy.). 30 capsule 0  . flecainide (TAMBOCOR) 100 MG tablet TAKE 1 TABLET (100 MG TOTAL) BY MOUTH 2 (TWO) TIMES DAILY. 30 tablet 11  . glipiZIDE (GLUCOTROL) 10 MG 24 hr tablet Take 10 mg by mouth 2 (two) times daily.     Marland Kitchen lisinopril-hydrochlorothiazide (PRINZIDE,ZESTORETIC) 10-12.5 MG per tablet Take 1 tablet by mouth at bedtime.     . metFORMIN (GLUCOPHAGE) 500 MG tablet Take 1,000 mg by mouth 2 (two) times daily.     . metoprolol succinate (TOPROL-XL) 25 MG 24 hr tablet TAKE 1 TABLET (25 MG TOTAL) BY MOUTH DAILY. 30 tablet 11  . pravastatin (PRAVACHOL) 40 MG tablet Take 1 tablet (40 mg total) by mouth every evening. 90 tablet 3  . rivaroxaban (XARELTO) 20 MG TABS tablet Take 1 tablet (20 mg total) by mouth at bedtime. 30 tablet 11  . LANTUS SOLOSTAR 100 UNIT/ML injection Inject 20 Units into the skin at bedtime.     . traMADol (ULTRAM) 50 MG tablet Take 1 tablet (50 mg total) by mouth every 6 (six) hours as needed. 15 tablet 0   No facility-administered medications prior to visit.  Past Medical History:  Diagnosis Date  . Diabetes mellitus   . GERD (gastroesophageal reflux disease)   . History of alcohol abuse   . History of cocaine use   . History of echocardiogram    a. Echo 4/14: Moderate LVH, vigorous LVEF, EF 65-70%, normal wall motion, grade 2 diastolic dysfunction, mildly dilated aortic root and ascending aorta, ascending aorta 40 mm, aortic root 38 mm, mild LAE  . Hx of cardiovascular stress test    a. GXT 5/14: No ischemic changes  //  b. ETT-Myoview 3/16:  Low  risk, no ischemia, EF 58%  . Hyperlipidemia   . Hypertension   . Morbid obesity ([place])   . Obesity   . Paroxysmal atrial fibrillation ([place])    Occurring in 2008, with several recurrence since then (including in the setting of + cocaine on UDS).  . Sleep apnea       Past Surgical History:  Procedure Laterality Date  . APPENDECTOMY    . ROTATOR CUFF REPAIR    . TONSILLECTOMY        Family History  Problem Relation Age of Onset  . Diabetes Mother   . Heart attack Father 62  . Stroke Maternal Grandmother   . Stroke Paternal Grandmother   . Diabetes Paternal Grandfather       Social History   Social History  . Marital status: Single    Spouse name: N/A  . Number of children: 0  . Years of education: 12   Occupational History  . Unemployed Unemployed   Social History Main Topics  . Smoking status: Current Every Day Smoker    Packs/day: 0.25    Years: 28.00    Last attempt to quit: 10/17/2007  . Smokeless tobacco: Former Systems developer    Types: Snuff     Comment: smokes one pack per month  . Alcohol use Yes     Comment: occasionally - 1 beer 1x/[place]  . Drug use: No     Comment: cocaine in the past, none currently  . Sexual activity: Not on file   Other Topics Concern  . Not on file   Social History Narrative   Fun: Drag racing     Review of Systems  Constitutional: Negative for chills and fever.  Eyes:       Negative for changes in vision  Respiratory: Negative for cough, chest tightness, shortness of breath and wheezing.   Cardiovascular: Negative for chest pain, palpitations and leg swelling.  Endocrine: Negative for polydipsia, polyphagia and polyuria.  Neurological: Negative for dizziness, weakness, light-headedness and headaches.       Objective:    BP (!) 150/98 (BP Location: Left Arm, Patient Position: Sitting, Cuff Size: Large)   Pulse 88   Temp 97.9 F (36.6 C) (Oral)   Resp 16   Ht 5' 9" (1.753 m)   Wt 299 lb 6.4 oz (135.8 kg)   SpO2 96%    BMI 44.21 kg/m  Nursing note and vital signs reviewed.  Physical Exam  Constitutional: He is oriented to person, place, and time. He appears well-developed and well-nourished. No distress.  Cardiovascular: Normal rate, regular rhythm, normal heart sounds and intact distal pulses.   Pulmonary/Chest: Effort normal and breath sounds normal.  Neurological: He is alert and oriented to person, place, and time.  Diabetic Foot Exam - Simple   Simple Foot Form Visual Inspection No deformities, no ulcerations, no other skin breakdown bilaterally:  Yes Sensation Testing Intact to touch and monofilament  testing bilaterally:  Yes Pulse Check Posterior Tibialis and Dorsalis pulse intact bilaterally:  Yes Comments Possibly onychomycosis of left great toe.   Skin: Skin is warm and dry.  Psychiatric: He has a normal mood and affect. His behavior is normal. Judgment and thought content normal.        Assessment & Plan:   Problem List Items Addressed This Visit      Cardiovascular and Mediastinum   PAF (paroxysmal atrial fibrillation) ([place]) - Primary    Stable with current dosage of denied and anticoagulated with Xarelto. No adverse side effects or nuisance bleeding. Regular rate and rhythm noted on physical exam. Continue current dosage of flexion and Xarelto.      Hypertensive heart disease    Blood pressure remains above goal 140/90 with current medication regimen. Increase lisinopril-hydrochlorothiazide. Encouraged to monitor blood pressure at home and follow low-sodium diet. Denies worst headache of life with no symptoms of end organ damage noted physical exam. Continue to monitor.        Endocrine   Diabetes mellitus ([place])    Type 2 diabetes most likely poorly controlled with blood sugars are not checked at home and no medication 1 month secondary to running out of medication. Obtain hemoglobin A1c, CMET and urine microalbumin. Sample of basglar provided to restart insulin. Refills of  glipizide and metformin. Diabetic foot exam completed today. Due for diabetic eye exam encouraged to be completed independently. Pravastatin for CAD risk reduction with no current Ace/ARB. Pneumovax is up-to-date. Encouraged to monitor blood sugars at home and follow carbohydrate diet. Declines diabetic education. Follow-up in one month or sooner pending A1c results.      Relevant Medications   Insulin Glargine (BASAGLAR KWIKPEN) 100 UNIT/ML SOPN   Other Relevant Orders   Hemoglobin A1c (Completed)   Urine Microalbumin w/creat. ratio (Completed)   Comprehensive metabolic panel (Completed)     Other   Dyslipidemia    Obtain lipid profile. Continue current dosage of pravastatin pending lipid profile results.      Relevant Orders   Lipid panel (Completed)   Class 3 obesity due to excess calories with serious comorbidity and body mass index (BMI) of 40.0 to 44.9 in adult (HCC)    BMI of 44. Recommend weight loss of 5-10% of current body weight. Recommend increasing physical activity to 30 minutes of moderate level activity daily. Encourage nutritional intake that focuses on nutrient dense foods and is moderate, varied, and balanced and is low in saturated fats and processed/sugary foods. Continue to monitor.        Relevant Medications   Insulin Glargine (BASAGLAR KWIKPEN) 100 UNIT/ML SOPN       I have discontinued Mr. Dietze LANTUS SOLOSTAR and traMADol. I am also having him start on BASAGLAR KWIKPEN, Insulin Pen Needle, ONE TOUCH SURESOFT, glucose blood, and ONE TOUCH ULTRA MINI. Additionally, I am having him maintain his glipiZIDE, metFORMIN, lisinopril-hydrochlorothiazide, metoprolol succinate, rivaroxaban, flecainide, pravastatin, and amphetamine-dextroamphetamine.   Meds ordered this encounter  Medications  . Insulin Glargine (BASAGLAR KWIKPEN) 100 UNIT/ML SOPN    Sig: Inject 0.2 mLs (20 Units total) into the skin at bedtime.    Dispense:  1 pen    Refill:  0    Order Specific  Question:   Supervising Provider    Answer:   Pricilla Holm A [[id number]]  . Insulin Pen Needle 32G X 4 MM MISC    Sig: Use 1 needle per injection to inject insulin subcutaneously once daily.  Dispense:  30 each    Refill:  1    Substitution permissible per insurance coverage. Dx: E11.9    Order Specific Question:   Supervising Provider    Answer:   Pricilla Holm A [[id number]]  . Lancets Misc. (ONE TOUCH SURESOFT) MISC    Sig: Use 1 lancet per test. Test blood sugars 1-4 times per day as instructed.    Dispense:  1 each    Refill:  1    Substitution permissible per insurance coverage. Dx E11.9.    Order Specific Question:   Supervising Provider    Answer:   Pricilla Holm A [[id number]]  . glucose blood (ONE TOUCH ULTRA TEST) test strip    Sig: Use one strip per test. Test blood sugars 1-4 times daily as instructed.    Dispense:  100 each    Refill:  12    Substitution permissible per insurance coverage. Dx E11.9.    Order Specific Question:   Supervising Provider    Answer:   Pricilla Holm A [[id number]]  . Blood Glucose Monitoring Suppl (ONE TOUCH ULTRA MINI) w/Device KIT    Sig: Use meter to check blood sugars 1-4 times daily as instructed.    Dispense:  1 each    Refill:  0    Substitution permissible per insurance coverage. Dx E11.9.    Order Specific Question:   Supervising Provider    Answer:   Pricilla Holm A [[id number]]     Follow-up: Return in about 1 month (around 01/05/2017), or if symptoms worsen or fail to improve.  Mauricio Po, FNP

## 2016-12-08 NOTE — Assessment & Plan Note (Signed)
Blood pressure remains above goal 140/90 with current medication regimen. Increase lisinopril-hydrochlorothiazide. Encouraged to monitor blood pressure at home and follow low-sodium diet. Denies worst headache of life with no symptoms of end organ damage noted physical exam. Continue to monitor.

## 2016-12-08 NOTE — Assessment & Plan Note (Signed)
Stable with current dosage of denied and anticoagulated with Xarelto. No adverse side effects or nuisance bleeding. Regular rate and rhythm noted on physical exam. Continue current dosage of flexion and Xarelto.

## 2016-12-08 NOTE — Assessment & Plan Note (Signed)
BMI of 44. Recommend weight loss of 5-10% of current body weight. Recommend increasing physical activity to 30 minutes of moderate level activity daily. Encourage nutritional intake that focuses on nutrient dense foods and is moderate, varied, and balanced and is low in saturated fats and processed/sugary foods. Continue to monitor.   

## 2016-12-12 MED ORDER — METFORMIN HCL 500 MG PO TABS
1000.0000 mg | ORAL_TABLET | Freq: Two times a day (BID) | ORAL | 0 refills | Status: DC
Start: 2016-12-12 — End: 2018-01-08

## 2016-12-12 MED ORDER — GLIPIZIDE ER 10 MG PO TB24: 10.0000 mg | ORAL_TABLET | Freq: Every day | ORAL | 0 refills | Status: DC

## 2016-12-18 ENCOUNTER — Telehealth: Payer: Self-pay | Admitting: Internal Medicine

## 2016-12-18 DIAGNOSIS — G4733 Obstructive sleep apnea (adult) (pediatric): Secondary | ICD-10-CM | POA: Diagnosis not present

## 2016-12-18 MED ORDER — AMPHETAMINE-DEXTROAMPHET ER 30 MG PO CP24: 30.0000 mg | ORAL_CAPSULE | Freq: Every day | ORAL | 0 refills | Status: DC | PRN

## 2016-12-18 NOTE — Telephone Encounter (Signed)
Rx printed and placed on CDY's cart to be signed  Spoke with the pt and he wants to pick this up  Florentina AddisonKatie, will you make sure this gets up front, thanks

## 2016-12-18 NOTE — Telephone Encounter (Signed)
Ok to refill 

## 2016-12-18 NOTE — Telephone Encounter (Signed)
CY  Please Advise-  Pt wanted a refill of his (ADDERALL XR) 30 MG 24 hr capsule    Last filled 2/518 Quantity #30 RF #0 Sig: Take 1 capsule (30 mg total) by mouth daily as needed (For narcolepsy.).  Last OV 04/06/16 TP 09/10/15 CY Pending OV 01/08/17

## 2016-12-19 NOTE — Telephone Encounter (Signed)
Rx was signed by CY this morning and I personally placed the Rx in the pick up file at front. Nothing more needed at this time.

## 2016-12-29 ENCOUNTER — Ambulatory Visit: Payer: Self-pay | Admitting: Family

## 2017-01-08 ENCOUNTER — Ambulatory Visit: Payer: Self-pay | Admitting: Internal Medicine

## 2017-01-08 ENCOUNTER — Ambulatory Visit: Payer: Self-pay | Admitting: Family

## 2017-01-11 ENCOUNTER — Ambulatory Visit: Payer: Self-pay | Admitting: Internal Medicine

## 2017-01-18 ENCOUNTER — Telehealth: Payer: Self-pay | Admitting: Internal Medicine

## 2017-01-18 MED ORDER — AMPHETAMINE-DEXTROAMPHET ER 30 MG PO CP24: 30.0000 mg | ORAL_CAPSULE | Freq: Every day | ORAL | 0 refills | Status: DC | PRN

## 2017-01-18 NOTE — Telephone Encounter (Signed)
rx printed, signed, and placed in brown accordion file for pickup.   lmtcb X1 for pt to make aware.  

## 2017-01-18 NOTE — Telephone Encounter (Signed)
Patient returned call - advised rx is ready for pick up - pt needs nothing further -pr

## 2017-01-18 NOTE — Telephone Encounter (Signed)
Patient returned phone call.Erica R Taylor ° °

## 2017-01-18 NOTE — Telephone Encounter (Signed)
lmomtcb x 1   Last seen by CY--11/16 Seen by TP--04/06/16 Next ov with CY--03/09/17

## 2017-01-18 NOTE — Telephone Encounter (Signed)
Pt is requesting Rx for adderall XR. Last refilled 12/18/16 # 30 with 0 refills. Pt last ov 04-06-16 with an pending appointment for 03-09-17.  CY please advise on refill. Thanks.

## 2017-01-18 NOTE — Telephone Encounter (Signed)
Ok to refill 

## 2017-02-03 ENCOUNTER — Other Ambulatory Visit: Payer: Self-pay | Admitting: Family

## 2017-02-07 ENCOUNTER — Ambulatory Visit: Payer: 59 | Admitting: Family

## 2017-02-19 ENCOUNTER — Telehealth: Payer: Self-pay | Admitting: Internal Medicine

## 2017-02-19 MED ORDER — AMPHETAMINE-DEXTROAMPHET ER 30 MG PO CP24: 30.0000 mg | ORAL_CAPSULE | Freq: Every day | ORAL | 0 refills | Status: DC | PRN

## 2017-02-19 NOTE — Telephone Encounter (Signed)
CY  Please Advise-  Pt calling for a refill of his amphetamine-dextroamphetamine (ADDERALL XR) 30 MG 24 hr capsule Sig: Take 1 capsule (30 mg total) by mouth daily as needed (For narcolepsy.).  Last Filled: 01/18/17  Quantity: 30  Last OV 04/06/16 Pending OV 03/09/17

## 2017-02-19 NOTE — Telephone Encounter (Signed)
lmtcb for pt.  

## 2017-02-19 NOTE — Telephone Encounter (Signed)
Rx has been signed and placed in brown folder for pick up. Pt is aware and voiced his understanding. Nothing further needed.

## 2017-02-19 NOTE — Telephone Encounter (Signed)
Patient returned phone call.[name] ° °

## 2017-02-19 NOTE — Telephone Encounter (Signed)
Rx has been printed and place don CY's chart to be signed.  CY please return to triage once signed. Thanks.  

## 2017-02-19 NOTE — Telephone Encounter (Signed)
Ok to refill 

## 2017-03-09 ENCOUNTER — Encounter: Payer: Self-pay | Admitting: Internal Medicine

## 2017-03-09 ENCOUNTER — Ambulatory Visit (INDEPENDENT_AMBULATORY_CARE_PROVIDER_SITE_OTHER): Payer: 59 | Admitting: Internal Medicine

## 2017-03-09 VITALS — BP 130/76 | HR 87 | Resp 16 | Ht 69.0 in | Wt 305.8 lb

## 2017-03-09 DIAGNOSIS — Z720 Tobacco use: Secondary | ICD-10-CM | POA: Diagnosis not present

## 2017-03-09 DIAGNOSIS — G47411 Narcolepsy with cataplexy: Secondary | ICD-10-CM

## 2017-03-09 DIAGNOSIS — G4733 Obstructive sleep apnea (adult) (pediatric): Secondary | ICD-10-CM

## 2017-03-09 MED ORDER — AZELASTINE-FLUTICASONE 137-50 MCG/ACT NA SUSP: 2.0000 | Freq: Two times a day (BID) | NASAL | 0 refills | Status: DC

## 2017-03-09 MED ORDER — TRAZODONE HCL 50 MG PO TABS: 50.0000 mg | ORAL_TABLET | Freq: Every day | ORAL | 5 refills | Status: DC

## 2017-03-09 NOTE — Progress Notes (Signed)
HPI  male smoker followed for management of OSA and narcolepsy with cataplexy, complicated by allergic rhinitis, atrial fibrillation, dCHF, DM, HTN NPSG 11/23/00- AHI 20 per hour. Hypnagogic hallucination associated with dreaming. 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 Vivid dreams as soon as he falls asleep Multiple Sleep Latency Test 10/30/2012-pathologic daytime hypersomnia, nonspecific, compatible with idiopathic hypersomnia or narcolepsy. Mean latency 0.9 minutes with one sleep onset REM event  ---------------------------------------------------------------------------------------  09/10/2015-46 year old male smoker followed for management of OSA and narcolepsy with cataplexy, complicated by allergic rhinitis, atrial fibrillation, dCHF, DM, HTN CPAP 13/[facility] FOLLOWS FOR:  Pt has not worn CPAP since June. Pt states that he felt like he was suffocating while wearing it. Pt states he sleeps avg 4hrs per night. Rhinitis nasal congestion contributes to being smothered at night. Denies using cocaine now. Previously saw Dr. Macie BurowsNewman/ENT. Okay with current medications. No exacerbation of atrial fibrillation. Not using CPAP "smothers" and pressure 13.  03/09/17- 46 year old male smoker followed for management of OSA and narcolepsy with cataplexy, complicated by allergic rhinitis, atrial fibrillation, dCHF, DM, HTN CPAP 13/[facility] FOLLOW UP FOR OSA  DME  is [facility] patient is non- compliant with his CPAP  the patient states that he feels like it  makes him have bad dreams  Adderall ER 30 mg  He was turned down for disability. Complains Adderall is expensive so he doesn't use it every day. 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 He thinks may be CPAP previously was too high and smothered him but unsure. He would wake feeling smothered but unclear whether pressure was too high or too low. Still bothered by vivid dreams and we discussed trying to suppress some of that.  ROS-see  HPI Constitutional:   No-   weight loss, night sweats, fevers, chills,  +fatigue, lassitude. HEENT:   No-  headaches, difficulty swallowing, tooth/dental problems, sore throat,       No-  sneezing, itching, ear ache, + nasal congestion, post nasal drip,  CV:  No-   chest pain, orthopnea, PND, swelling in lower extremities, anasarca, dizziness, palpitations Resp: No-   shortness of breath with exertion or at rest.              No-   productive cough,  No non-productive cough,  No- coughing up of blood.              No-   change in color of mucus.  No- wheezing.   Skin: No-   rash or lesions. GI:  No-   heartburn, indigestion, abdominal pain, nausea, vomiting,  GU:  MS:  No-   joint pain or swelling.  Neuro-     +HPI Psych:  No- change in mood or affect. No depression or anxiety.  No memory loss.  OBJ- Physical Exam   General- Alert/ calm, Oriented, Affect-appropriate, Distress- none acute, +obese,  Skin- rash-none, lesions- none, excoriation- none Lymphadenopathy- none Head- atraumatic            Eyes- Gross vision intact, PERRLA, conjunctivae and secretions clear            Ears- Hearing, canals-normal            Nose- rhinitis/ turbinate edema, no-Septal dev, mucus, polyps, erosion, perforation             Throat- Mallampati III , mucosa clear , drainage- none, tonsils- atrophic Neck- flexible , trachea midline, no stridor , thyroid nl, carotid no bruit Chest - symmetrical excursion , unlabored  Heart/CV- RRR , no murmur , no gallop  , no rub, nl s1 s2                           - JVD- none , edema- none, stasis changes- none, varices- none           Lung- clear to P&A, wheeze- none, cough- none , dullness-none, rub- none           Chest wall-  Abd-  Br/ Gen/ Rectal- Not done, not indicated Extrem- cyanosis- none, clubbing, none, atrophy- none, strength- nl

## 2017-03-09 NOTE — Patient Instructions (Addendum)
Order- dme [facility]   Please increase CPAP to 15 and teach patient how to adjust RAMP for pressure. Continue mask of choice- please add chin strap. Humidifier, supplies, AirView   Dx OSA  Script for Trazodone to help sleep and maybe reduce the dreaming some  Sample Dymista nasal spray     1-2 puffs each nostril once or twice daily

## 2017-03-10 NOTE — Assessment & Plan Note (Signed)
Smoking a few cigarettes daily despite counseling. We reinforced importance of total abstinence.

## 2017-03-10 NOTE — Assessment & Plan Note (Addendum)
We again reviewed basic sleep hygiene, importance of naps and responsibility to drive only when alert. Visit dreams are disturbing and we may be able to suppress the with an antidepressant. Plan-try trazodone seeking improved sleep quality. Okay to continue Adderall with discussion.

## 2017-03-10 NOTE — Assessment & Plan Note (Addendum)
Insufficiently compliant with CPAP, describing "smothered". Until we can get an AutoPap machine where going to try first increasing pressure to 15. We also discussed trying chinstrap to maintain close mouth.

## 2017-03-20 DIAGNOSIS — G4733 Obstructive sleep apnea (adult) (pediatric): Secondary | ICD-10-CM | POA: Diagnosis not present

## 2017-03-26 ENCOUNTER — Telehealth: Payer: Self-pay | Admitting: Internal Medicine

## 2017-03-26 MED ORDER — AMPHETAMINE-DEXTROAMPHET ER 30 MG PO CP24: 30.0000 mg | ORAL_CAPSULE | Freq: Every day | ORAL | 0 refills | Status: DC | PRN

## 2017-03-26 NOTE — Telephone Encounter (Signed)
Please advise if okay to refill adderall xr 30 mg  Last had this given # 30 on 02/19/17  Last ov here 03/09/17 and next ov here 09/07/17  Thanks!

## 2017-03-26 NOTE — Telephone Encounter (Signed)
rx has been signed by CY and the rx has been placed up front and the pt is aware that this is ready to be picked up.  Nothing further is needed

## 2017-03-26 NOTE — Telephone Encounter (Signed)
Ok to refill 

## 2017-03-26 NOTE — Telephone Encounter (Signed)
rx has been printed out.

## 2017-04-09 ENCOUNTER — Ambulatory Visit: Payer: 59 | Admitting: Family

## 2017-04-23 ENCOUNTER — Telehealth: Payer: Self-pay | Admitting: Internal Medicine

## 2017-04-23 MED ORDER — AMPHETAMINE-DEXTROAMPHET ER 30 MG PO CP24: 30.0000 mg | ORAL_CAPSULE | Freq: Every day | ORAL | 0 refills | Status: DC | PRN

## 2017-04-23 NOTE — Telephone Encounter (Signed)
Rx has been printed and placed on CY's chart for signature.  CY please return Rx to triage once signed. Thanks.  

## 2017-04-23 NOTE — Telephone Encounter (Signed)
Left message for patient to call back  

## 2017-04-23 NOTE — Telephone Encounter (Signed)
Pt returning call and can be reached @ [phone].[name]

## 2017-04-23 NOTE — Telephone Encounter (Signed)
Pt is requesting Rx for Adderall XR 30mg  last refilled on 03/26/17 #30 with 0 refills.  Pt's last seen on 03/09/17 with an pending appointment for 09/07/17. Pt would like to pick Rx up.  CY please advise on refill. Thanks.

## 2017-04-23 NOTE — Telephone Encounter (Signed)
Ok

## 2017-04-23 NOTE — Telephone Encounter (Signed)
Spoke with patient. He is aware that the RX has been signed and placed up front for pickup. Nothing else needed at time of call.

## 2017-05-09 ENCOUNTER — Ambulatory Visit: Payer: Self-pay | Admitting: Family

## 2017-05-10 ENCOUNTER — Other Ambulatory Visit: Payer: Self-pay | Admitting: Physician Assistant

## 2017-05-10 DIAGNOSIS — I480 Paroxysmal atrial fibrillation: Secondary | ICD-10-CM

## 2017-05-10 DIAGNOSIS — I119 Hypertensive heart disease without heart failure: Secondary | ICD-10-CM

## 2017-05-11 ENCOUNTER — Ambulatory Visit: Payer: Self-pay | Admitting: Family

## 2017-05-17 ENCOUNTER — Ambulatory Visit: Payer: 59 | Admitting: Physician Assistant

## 2017-05-22 ENCOUNTER — Telehealth: Payer: Self-pay | Admitting: Internal Medicine

## 2017-05-22 MED ORDER — AMPHETAMINE-DEXTROAMPHET ER 30 MG PO CP24: 30.0000 mg | ORAL_CAPSULE | Freq: Every day | ORAL | 0 refills | Status: DC | PRN

## 2017-05-22 NOTE — Telephone Encounter (Signed)
Ok to refill 

## 2017-05-22 NOTE — Telephone Encounter (Signed)
Pt requesting to pick up rx refill on Adderall XR 30mg . Last refill: 04/23/17 #30 with 0 refills, take 1 tab po qd prn narcolepsy.  Last ov:03/09/17 Next ov: 09/07/17  CY please advise on refill.  Thanks!

## 2017-05-22 NOTE — Telephone Encounter (Signed)
rx printed, signed, and placed up front in brown accordion file for pickup.  Pt aware.  Nothing further needed.

## 2017-06-07 ENCOUNTER — Other Ambulatory Visit: Payer: Self-pay | Admitting: Physician Assistant

## 2017-06-07 DIAGNOSIS — I480 Paroxysmal atrial fibrillation: Secondary | ICD-10-CM

## 2017-06-07 DIAGNOSIS — I119 Hypertensive heart disease without heart failure: Secondary | ICD-10-CM

## 2017-06-08 DIAGNOSIS — E1165 Type 2 diabetes mellitus with hyperglycemia: Secondary | ICD-10-CM | POA: Diagnosis not present

## 2017-06-08 DIAGNOSIS — I519 Heart disease, unspecified: Secondary | ICD-10-CM | POA: Diagnosis not present

## 2017-06-08 DIAGNOSIS — G4733 Obstructive sleep apnea (adult) (pediatric): Secondary | ICD-10-CM | POA: Diagnosis not present

## 2017-06-08 DIAGNOSIS — I480 Paroxysmal atrial fibrillation: Secondary | ICD-10-CM | POA: Diagnosis not present

## 2017-06-08 DIAGNOSIS — I10 Essential (primary) hypertension: Secondary | ICD-10-CM | POA: Diagnosis not present

## 2017-06-08 DIAGNOSIS — E7800 Pure hypercholesterolemia, unspecified: Secondary | ICD-10-CM | POA: Diagnosis not present

## 2017-06-14 ENCOUNTER — Ambulatory Visit: Payer: 59 | Admitting: Physician Assistant

## 2017-06-14 NOTE — Progress Notes (Deleted)
Cardiology Office Note    Date:  06/14/2017   ID:  Douglas, Walsh 10/31/1970, MRN [id number]  PCP:  Golden Circle, FNP  Cardiologist:  ***   No chief complaint on file.   History of Present Illness:  [name] is a 46 y.o. male ***    Past Medical History:  Diagnosis Date  . Diabetes mellitus   . GERD (gastroesophageal reflux disease)   . History of alcohol abuse   . History of cocaine use   . History of echocardiogram    a. Echo 4/14: Moderate LVH, vigorous LVEF, EF 65-70%, normal wall motion, grade 2 diastolic dysfunction, mildly dilated aortic root and ascending aorta, ascending aorta 40 mm, aortic root 38 mm, mild LAE  . Hx of cardiovascular stress test    a. GXT 5/14: No ischemic changes  //  b. ETT-Myoview 3/16:  Low risk, no ischemia, EF 58%  . Hyperlipidemia   . Hypertension   . Morbid obesity ([place])   . Obesity   . Paroxysmal atrial fibrillation ([place])    Occurring in 2008, with several recurrence since then (including in the setting of + cocaine on UDS).  . Sleep apnea     Past Surgical History:  Procedure Laterality Date  . APPENDECTOMY    . ROTATOR CUFF REPAIR    . TONSILLECTOMY      Current Medications: Outpatient Medications Prior to Visit  Medication Sig Dispense Refill  . amphetamine-dextroamphetamine (ADDERALL XR) 30 MG 24 hr capsule Take 1 capsule (30 mg total) by mouth daily as needed (For narcolepsy.). 30 capsule 0  . Azelastine-Fluticasone 137-50 MCG/ACT SUSP Place 2 puffs into the nose 2 (two) times daily. 1 Bottle 0  . Blood Glucose Monitoring Suppl (ONE TOUCH ULTRA MINI) w/Device KIT Use meter to check blood sugars 1-4 times daily as instructed. 1 each 0  . flecainide (TAMBOCOR) 100 MG tablet TAKE 1 TABLET (100 MG TOTAL) BY MOUTH 2 (TWO) TIMES DAILY. 30 tablet 0  . glipiZIDE (GLUCOTROL XL) 10 MG 24 hr tablet TAKE 1 TABLET (10 MG TOTAL) BY MOUTH DAILY. 90 tablet 0  . glucose blood (ONE TOUCH ULTRA TEST) test strip  Use one strip per test. Test blood sugars 1-4 times daily as instructed. 100 each 12  . Insulin Glargine (BASAGLAR KWIKPEN) 100 UNIT/ML SOPN Inject 0.2 mLs (20 Units total) into the skin at bedtime. 1 pen 0  . Insulin Pen Needle 32G X 4 MM MISC Use 1 needle per injection to inject insulin subcutaneously once daily. 30 each 1  . Lancets Misc. (ONE TOUCH SURESOFT) MISC Use 1 lancet per test. Test blood sugars 1-4 times per day as instructed. 1 each 1  . lisinopril-hydrochlorothiazide (PRINZIDE,ZESTORETIC) 10-12.5 MG per tablet Take 1 tablet by mouth at bedtime.     . metFORMIN (GLUCOPHAGE) 500 MG tablet Take 2 tablets (1,000 mg total) by mouth 2 (two) times daily. 60 tablet 0  . metoprolol succinate (TOPROL-XL) 25 MG 24 hr tablet TAKE 1 TABLET BY MOUTH EVERY DAY 30 tablet 0  . pravastatin (PRAVACHOL) 40 MG tablet Take 1 tablet (40 mg total) by mouth every evening. 90 tablet 3  . traZODone (DESYREL) 50 MG tablet Take 1 tablet (50 mg total) by mouth at bedtime. 30 tablet 5  . XARELTO 20 MG TABS tablet TAKE 1 TABLET (20 MG TOTAL) BY MOUTH DAILY WITH SUPPER. 30 tablet 0   No facility-administered medications prior to visit.      Allergies:  Shrimp [shellfish allergy]; Banana; Watermelon flavor; Other; Peanut-containing drug products; Almond oil; and Sulfa antibiotics   Social History   Social History  . Marital status: Single    Spouse name: N/A  . Number of children: 0  . Years of education: 12   Occupational History  . Unemployed Unemployed   Social History Main Topics  . Smoking status: Current Every Day Smoker    Packs/day: 0.25    Years: 28.00    Last attempt to quit: 10/17/2007  . Smokeless tobacco: Former Systems developer    Types: Snuff     Comment: smokes one pack per month  . Alcohol use Yes     Comment: occasionally - 1 beer 1x/[place]  . Drug use: No     Comment: cocaine in the past, none currently  . Sexual activity: Not on file   Other Topics Concern  . Not on file   Social  History Narrative   Fun: Designer, fashion/clothing      Family History:  The patient's ***family history includes Diabetes in his mother and paternal grandfather; Heart attack (age of onset: 42) in his father; Stroke in his maternal grandmother and paternal grandmother.   ROS:   Please see the history of present illness.    ROS All other systems reviewed and are negative.   PHYSICAL EXAM:   VS:  There were no vitals taken for this visit.   GEN: Well nourished, well developed, in no acute distress HEENT: normal Neck: no JVD, carotid bruits, or masses Cardiac: ***RRR; no murmurs, rubs, or gallops,no edema  Respiratory:  clear to auscultation bilaterally, normal work of breathing GI: soft, nontender, nondistended, + BS MS: no deformity or atrophy Skin: warm and dry, no rash Neuro:  Alert and Oriented x 3, Strength and sensation are intact Psych: euthymic mood, full affect  Wt Readings from Last 3 Encounters:  03/09/17 (!) 305 lb 12.8 oz (138.7 kg)  12/08/16 299 lb 6.4 oz (135.8 kg)  10/03/16 (!) 317 lb (143.8 kg)      Studies/Labs Reviewed:   EKG:  EKG is*** ordered today.  The ekg ordered today demonstrates ***  Recent Labs: 12/08/2016: ALT 28; BUN 15; Creatinine, Ser 0.97; Potassium 4.0; Sodium 132   Lipid Panel    Component Value Date/Time   CHOL 224 (H) 12/08/2016 0852   TRIG (H) 12/08/2016 0852    435.0 Triglyceride is over 400; calculations on Lipids are invalid.   HDL 32.50 (L) 12/08/2016 0852   CHOLHDL 7 12/08/2016 0852   VLDL 65 (H) 04/07/2016 0851   LDLCALC 57 04/07/2016 0851   LDLDIRECT 103.0 12/08/2016 0852    Additional studies/ records that were reviewed today include:  ***    ASSESSMENT:    No diagnosis found.   PLAN:  In order of problems listed above:  1. ***    Medication Adjustments/Labs and Tests Ordered: Current medicines are reviewed at length with the patient today.  Concerns regarding medicines are outlined above.  Medication changes, Labs  and Tests ordered today are listed in the Patient Instructions below. There are no Patient Instructions on file for this visit.   [name], [place]  06/14/2017 3:06 PM    [name] [address], [place], [place]  [address] Phone: [phone]; Fax: [phone]

## 2017-06-23 ENCOUNTER — Other Ambulatory Visit: Payer: Self-pay | Admitting: Physician Assistant

## 2017-06-23 DIAGNOSIS — I480 Paroxysmal atrial fibrillation: Secondary | ICD-10-CM

## 2017-06-25 ENCOUNTER — Telehealth: Payer: Self-pay | Admitting: Internal Medicine

## 2017-06-25 MED ORDER — AMPHETAMINE-DEXTROAMPHET ER 30 MG PO CP24: 30.0000 mg | ORAL_CAPSULE | Freq: Every day | ORAL | 0 refills | Status: DC | PRN

## 2017-06-25 NOTE — Telephone Encounter (Signed)
Ok to refill 

## 2017-06-25 NOTE — Telephone Encounter (Signed)
Pt is requesting a refill on Adderall XR 30mg . Last OV with CY was on 03/09/2017. Has pending OV with CY on 09/07/2017. Last refill was on 05/22/2017 #30.  CY - please advise on refill. Thanks.

## 2017-06-25 NOTE — Telephone Encounter (Signed)
rx printed and left on CY's cart for signature.    Spoke with pt, aware of refill approval.  Pt will pick up rx from office.  Will route to KW to ensure that rx is left up front for pickup after signature.

## 2017-06-25 NOTE — Telephone Encounter (Signed)
Rx has been placed at front for pick up

## 2017-07-10 DIAGNOSIS — E113491 Type 2 diabetes mellitus with severe nonproliferative diabetic retinopathy without macular edema, right eye: Secondary | ICD-10-CM | POA: Diagnosis not present

## 2017-07-10 DIAGNOSIS — H3563 Retinal hemorrhage, bilateral: Secondary | ICD-10-CM | POA: Diagnosis not present

## 2017-07-10 DIAGNOSIS — H35031 Hypertensive retinopathy, right eye: Secondary | ICD-10-CM | POA: Diagnosis not present

## 2017-07-10 DIAGNOSIS — E113492 Type 2 diabetes mellitus with severe nonproliferative diabetic retinopathy without macular edema, left eye: Secondary | ICD-10-CM | POA: Diagnosis not present

## 2017-07-24 ENCOUNTER — Other Ambulatory Visit: Payer: Self-pay | Admitting: Internal Medicine

## 2017-07-24 MED ORDER — AMPHETAMINE-DEXTROAMPHET ER 30 MG PO CP24: 30.0000 mg | ORAL_CAPSULE | Freq: Every day | ORAL | 0 refills | Status: DC | PRN

## 2017-07-24 NOTE — Telephone Encounter (Signed)
Ok to refill 

## 2017-07-24 NOTE — Telephone Encounter (Signed)
Spoke with patient. He is requesting a refill on his Adderall  XR.   Last OV: 03/09/17 Next OV: 09/07/17 Last RX: 06/25/17 for 30 capsules  CY, please advise if this refill is appropriate. Thanks!

## 2017-07-24 NOTE — Telephone Encounter (Signed)
Signed rx returned to triage by CY.  Spoke with pt, aware of rx ready for pickup.  Nothing further needed.

## 2017-07-24 NOTE — Telephone Encounter (Signed)
rx printed and left on CY's cart for signature.

## 2017-08-03 ENCOUNTER — Encounter (HOSPITAL_COMMUNITY): Payer: Self-pay

## 2017-08-03 ENCOUNTER — Emergency Department (HOSPITAL_COMMUNITY): Payer: 59

## 2017-08-03 ENCOUNTER — Emergency Department (HOSPITAL_COMMUNITY)
Admission: EM | Admit: 2017-08-03 | Discharge: 2017-08-03 | Disposition: A | Discharge status: Home / Self Care | Payer: 59 | Attending: Emergency Medicine | Admitting: Emergency Medicine

## 2017-08-03 DIAGNOSIS — J181 Lobar pneumonia, unspecified organism: Secondary | ICD-10-CM | POA: Diagnosis not present

## 2017-08-03 DIAGNOSIS — Z794 Long term (current) use of insulin: Secondary | ICD-10-CM | POA: Insufficient documentation

## 2017-08-03 DIAGNOSIS — R05 Cough: Secondary | ICD-10-CM | POA: Diagnosis present

## 2017-08-03 DIAGNOSIS — F17210 Nicotine dependence, cigarettes, uncomplicated: Secondary | ICD-10-CM | POA: Insufficient documentation

## 2017-08-03 DIAGNOSIS — Z7901 Long term (current) use of anticoagulants: Secondary | ICD-10-CM | POA: Diagnosis not present

## 2017-08-03 DIAGNOSIS — Z79899 Other long term (current) drug therapy: Secondary | ICD-10-CM | POA: Diagnosis not present

## 2017-08-03 DIAGNOSIS — J189 Pneumonia, unspecified organism: Secondary | ICD-10-CM | POA: Diagnosis not present

## 2017-08-03 DIAGNOSIS — E119 Type 2 diabetes mellitus without complications: Secondary | ICD-10-CM | POA: Insufficient documentation

## 2017-08-03 DIAGNOSIS — I110 Hypertensive heart disease with heart failure: Secondary | ICD-10-CM | POA: Insufficient documentation

## 2017-08-03 DIAGNOSIS — R0602 Shortness of breath: Secondary | ICD-10-CM | POA: Diagnosis not present

## 2017-08-03 DIAGNOSIS — Z91010 Allergy to peanuts: Secondary | ICD-10-CM | POA: Insufficient documentation

## 2017-08-03 DIAGNOSIS — I5032 Chronic diastolic (congestive) heart failure: Secondary | ICD-10-CM | POA: Insufficient documentation

## 2017-08-03 LAB — CBC
HCT: 39.8 % (ref 39.0–52.0)
Hemoglobin: 13.3 g/dL (ref 13.0–17.0)
MCH: 27.7 pg (ref 26.0–34.0)
MCHC: 33.4 g/dL (ref 30.0–36.0)
MCV: 82.9 fL (ref 78.0–100.0)
PLATELETS: 461 10*3/uL — AB (ref 150–400)
RBC: 4.8 MIL/uL (ref 4.22–5.81)
RDW: 12.9 % (ref 11.5–15.5)
WBC: 15.4 10*3/uL — AB (ref 4.0–10.5)

## 2017-08-03 LAB — BASIC METABOLIC PANEL
Anion gap: 9 (ref 5–15)
BUN: 11 mg/dL (ref 6–20)
CALCIUM: 8.9 mg/dL (ref 8.9–10.3)
CHLORIDE: 99 mmol/L — AB (ref 101–111)
CO2: 28 mmol/L (ref 22–32)
CREATININE: 1.19 mg/dL (ref 0.61–1.24)
Glucose, Bld: 222 mg/dL — ABNORMAL HIGH (ref 65–99)
Potassium: 4.5 mmol/L (ref 3.5–5.1)
Sodium: 136 mmol/L (ref 135–145)

## 2017-08-03 IMAGING — DX DG CHEST 2V
2 series · 2 of 2 positions shown · non-contrast
Comparison: Chest x-ray dated [DATE].

CLINICAL DATA: Cough, shortness of breath.

EXAM:
CHEST  2 VIEW

[w chest lat]
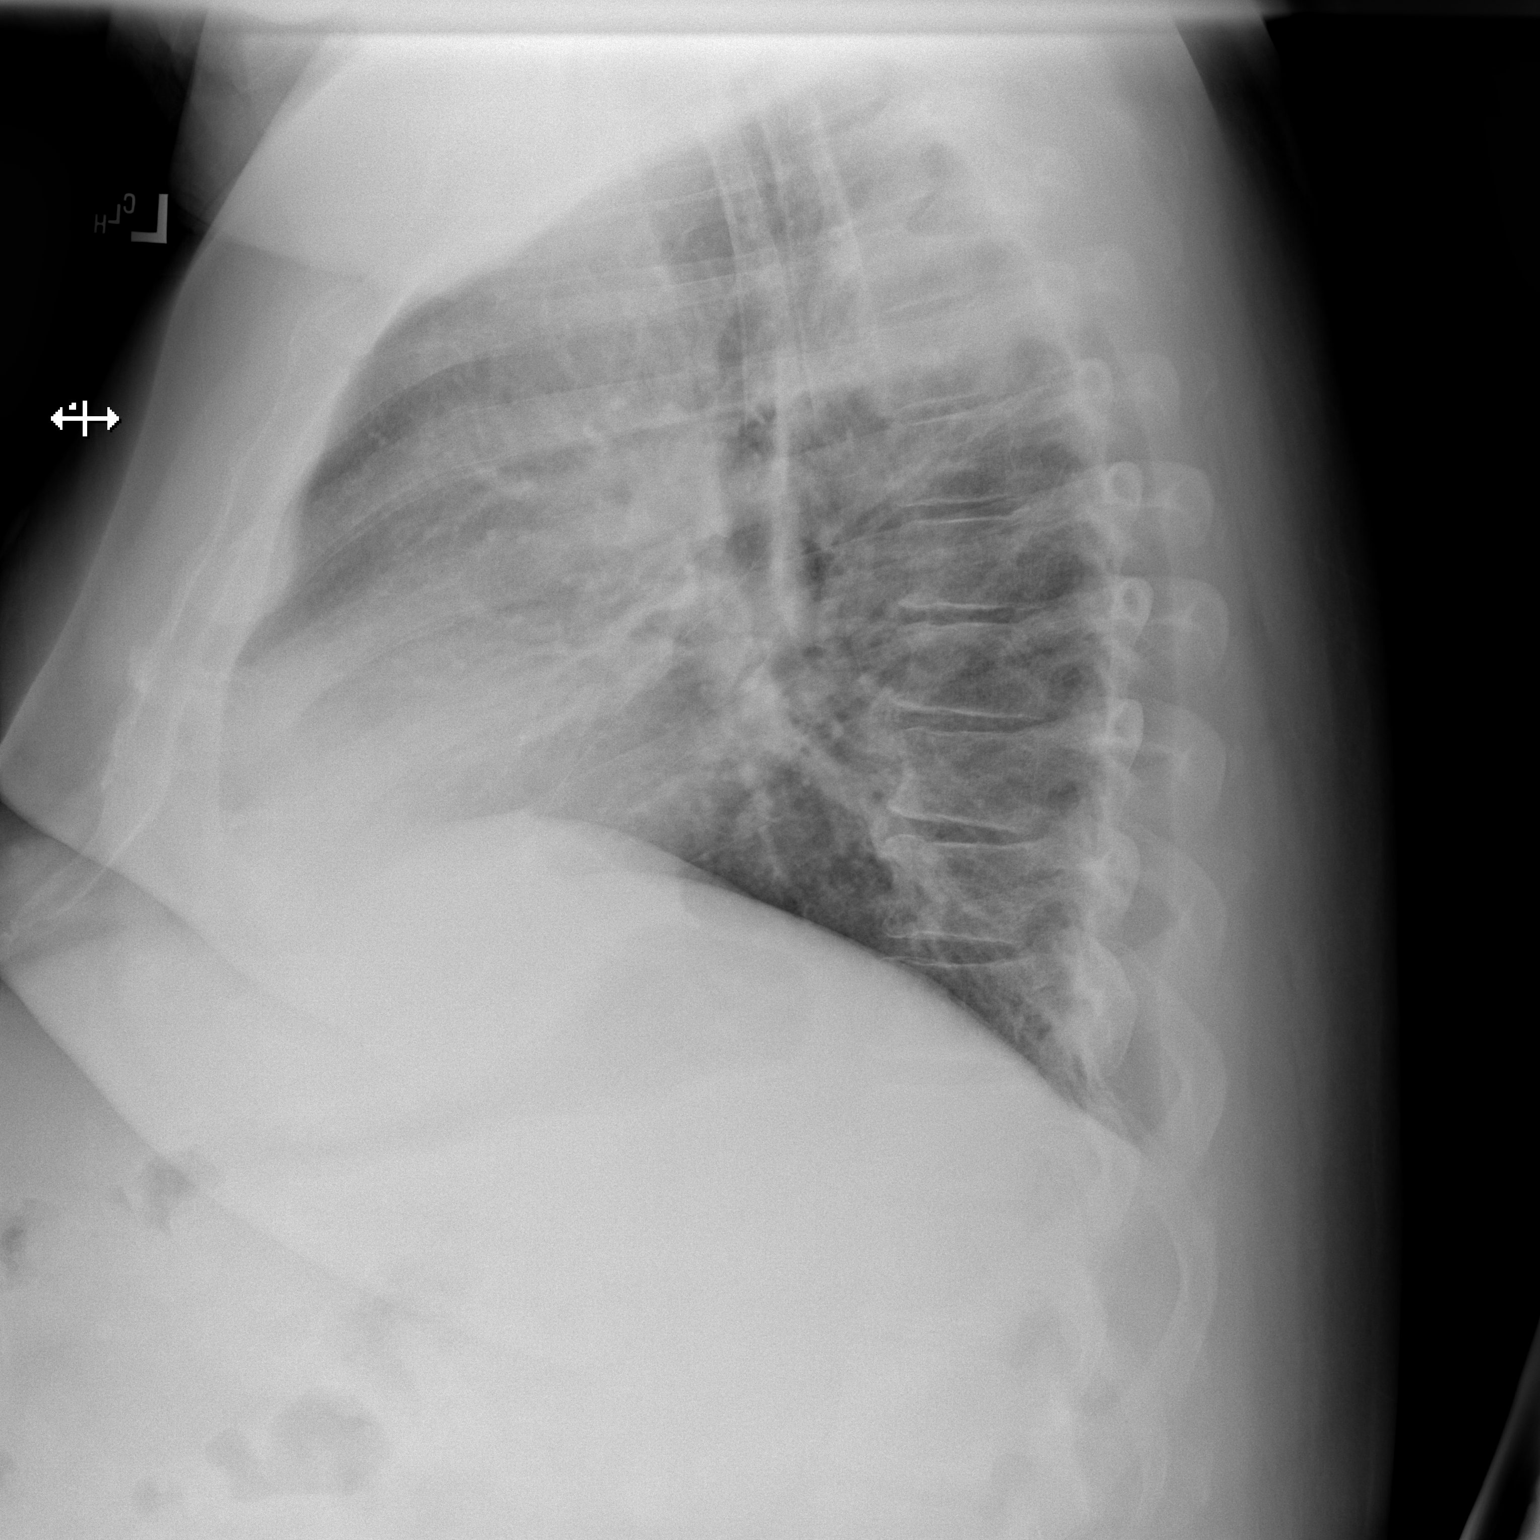

[w chest pa]
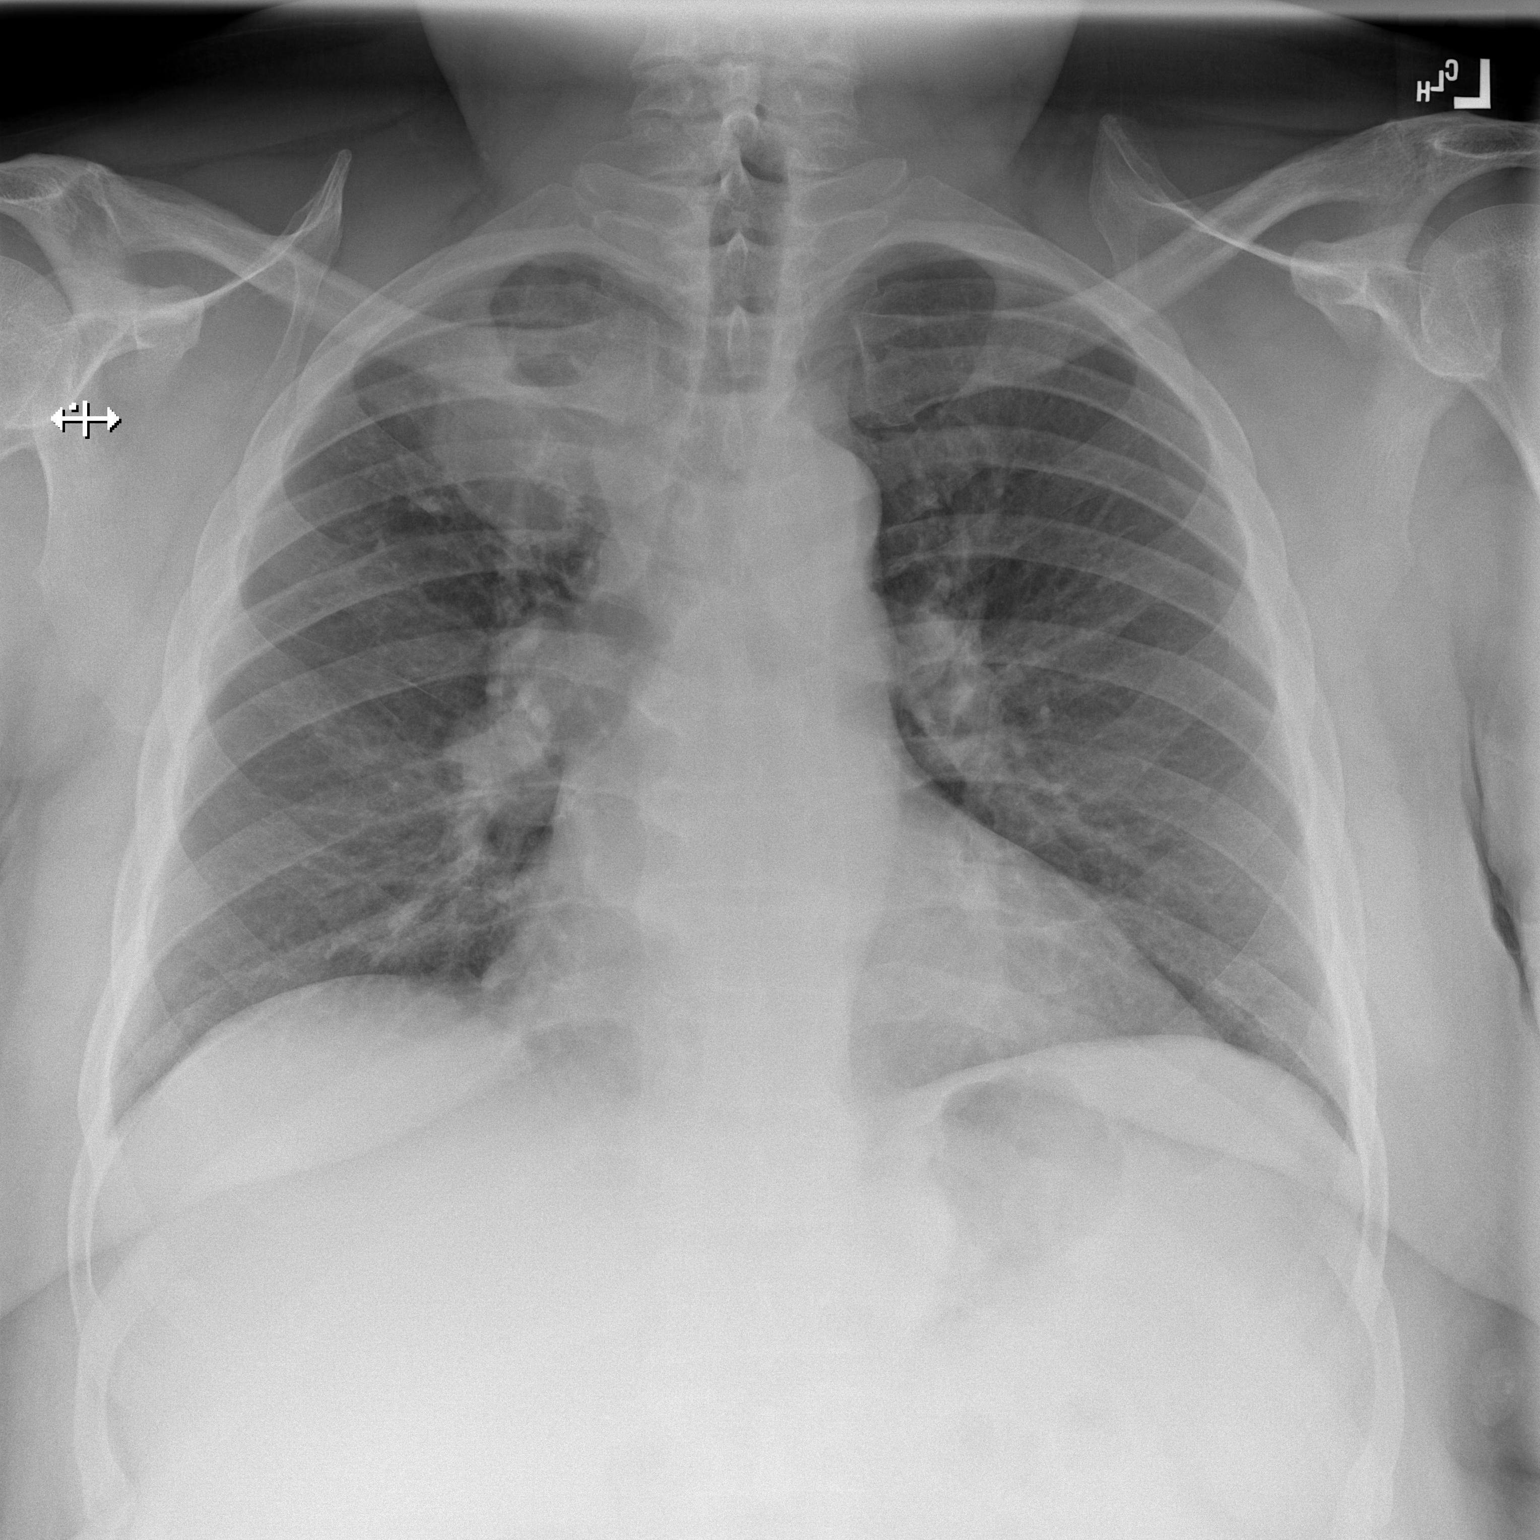

[2 of 2 positions shown; findings below may reference images not displayed]

FINDINGS: The cardiomediastinal silhouette is normal in size. Normal pulmonary
vascularity. Consolidation in the right upper lobe. No pleural
effusion or pneumothorax. No acute osseous abnormality.
IMPRESSION: Consolidation in the right upper lobe, favoring pneumonia. Followup
PA and lateral chest X-ray is recommended in 3-4 weeks following
trial of antibiotic therapy to ensure resolution and exclude
underlying malignancy.

## 2017-08-03 MED ORDER — AZITHROMYCIN 250 MG PO TABS: 500.0000 mg | ORAL_TABLET | Freq: Once | ORAL | Status: DC

## 2017-08-03 MED ORDER — CEFTRIAXONE SODIUM 1 G IJ SOLR
1.0000 g | Freq: Once | INTRAMUSCULAR | Status: AC
  Administered 2017-08-03: 1 g via INTRAVENOUS
  Filled 2017-08-03: qty 10

## 2017-08-03 MED ORDER — DEXTROSE 5 % IV SOLN
500.0000 mg | Freq: Once | INTRAVENOUS | Status: AC
  Administered 2017-08-03: 500 mg via INTRAVENOUS
  Filled 2017-08-03: qty 500

## 2017-08-03 MED ORDER — CEFDINIR 300 MG PO CAPS: 300.0000 mg | ORAL_CAPSULE | Freq: Two times a day (BID) | ORAL | 0 refills | Status: DC

## 2017-08-03 NOTE — ED Triage Notes (Signed)
Pt. Reports having a cough since Monday.  Productive cough.  Pt. Reports that he feels he has coughed so hard that he might be in A-fib.  Denies any chest pain.  Sob with exertion.  Skin is warm and dry.  Pt. Denies any n/v/d

## 2017-08-03 NOTE — Discharge Instructions (Addendum)
It was our pleasure to provide your ER care today - we hope that you feel better.  Take antibiotic as prescribed.  Follow up with primary care doctor in the next 2-3 days for recheck.  Also, your doctor will need to repeat your chest xray in 1 months time to make sure your chest xray clears or improves - discuss this with your doctor at follow up with them.   Return to ER right away if worse, new symptoms, increased difficulty breathing, chest pain, weak/fainting, other concern.

## 2017-08-03 NOTE — ED Provider Notes (Addendum)
[facility] EMERGENCY DEPARTMENT Provider Note   CSN: [id number] Arrival date & time: 08/03/17  1201     History   Chief Complaint Chief Complaint  Patient presents with  . Cough    HPI [name] is a 46 y.o. male.  Patient c/o productive cough in past 1-2 weeks, cough is episodic, persistent, worsening. Denies sore throat or runny nose. Subjective fever. No chest pain or discomfort. No leg pain or swelling. No known ill contacts. Hx afib, states he goes in and out of a fib. Is on home meds, taking as directed, including xarelto. Denies rapid hr or syncope.    The history is provided by the patient.  Cough  Pertinent negatives include no chest pain, no headaches, no sore throat, no shortness of breath and no eye redness.    Past Medical History:  Diagnosis Date  . Diabetes mellitus   . GERD (gastroesophageal reflux disease)   . History of alcohol abuse   . History of cocaine use   . History of echocardiogram    a. Echo 4/14: Moderate LVH, vigorous LVEF, EF 65-70%, normal wall motion, grade 2 diastolic dysfunction, mildly dilated aortic root and ascending aorta, ascending aorta 40 mm, aortic root 38 mm, mild LAE  . Hx of cardiovascular stress test    a. GXT 5/14: No ischemic changes  //  b. ETT-Myoview 3/16:  Low risk, no ischemia, EF 58%  . Hyperlipidemia   . Hypertension   . Morbid obesity ([place])   . Obesity   . Paroxysmal atrial fibrillation ([place])    Occurring in 2008, with several recurrence since then (including in the setting of + cocaine on UDS).  . Sleep apnea     Patient Active Problem List   Diagnosis Date Noted  . Class 3 obesity due to excess calories with serious comorbidity and body mass index (BMI) of 40.0 to 44.9 in adult 12/08/2016  . PAF (paroxysmal atrial fibrillation) ([place]) 01/23/2013  . Diabetes mellitus ([place]) 01/23/2013  . Hypertensive heart disease 01/23/2013  . Tobacco user 01/23/2013  . Chronic diastolic heart  failure ([place]) 01/21/2013  . 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 Shoulder pain, right 02/15/2011  . Seasonal and perennial allergic rhinitis 02/13/2008  . Dyslipidemia 02/12/2008  . OBESITY, MORBID 02/12/2008  . COCAINE Abuse, past hx 02/12/2008  . Obstructive sleep apnea 02/12/2008    Past Surgical History:  Procedure Laterality Date  . APPENDECTOMY    . ROTATOR CUFF REPAIR    . TONSILLECTOMY         Home Medications    Prior to Admission medications   Medication Sig Start Date End Date Taking? Authorizing Provider  amphetamine-dextroamphetamine (ADDERALL XR) 30 MG 24 hr capsule Take 1 capsule (30 mg total) by mouth daily as needed (For narcolepsy.). 07/24/17  Yes Young, Tarri Fuller D, MD  Azelastine-Fluticasone 137-50 MCG/ACT SUSP Place 2 puffs into the nose 2 (two) times daily. 03/09/17  Yes Young, Tarri Fuller D, MD  Blood Glucose Monitoring Suppl (ONE TOUCH ULTRA MINI) w/Device KIT Use meter to check blood sugars 1-4 times daily as instructed. 12/08/16  Yes Golden Circle, FNP  flecainide (TAMBOCOR) 100 MG tablet TAKE 1 TABLET BY MOUTH TWICE A DAY 06/25/17  Yes Weaver, Scott T, PA-C  glipiZIDE (GLUCOTROL XL) 10 MG 24 hr tablet TAKE 1 TABLET (10 MG TOTAL) BY MOUTH DAILY. 02/05/17  Yes Golden Circle, FNP  glucose blood (ONE TOUCH ULTRA TEST) test strip Use one strip per  test. Test blood sugars 1-4 times daily as instructed. 12/08/16  Yes Golden Circle, FNP  Insulin Glargine (BASAGLAR KWIKPEN) 100 UNIT/ML SOPN Inject 0.2 mLs (20 Units total) into the skin at bedtime. 12/08/16  Yes Golden Circle, FNP  Insulin Pen Needle 32G X 4 MM MISC Use 1 needle per injection to inject insulin subcutaneously once daily. 12/08/16  Yes Golden Circle, FNP  Lancets Misc. (ONE TOUCH SURESOFT) MISC Use 1 lancet per test. Test blood sugars 1-4 times per day as instructed. 12/08/16  Yes Golden Circle, FNP  lisinopril-hydrochlorothiazide (PRINZIDE,ZESTORETIC) 10-12.5 MG per tablet Take 1 tablet by  mouth at bedtime.    Yes [provider]  metFORMIN (GLUCOPHAGE) 500 MG tablet Take 2 tablets (1,000 mg total) by mouth 2 (two) times daily. 12/12/16  Yes Golden Circle, FNP  metoprolol succinate (TOPROL-XL) 25 MG 24 hr tablet TAKE 1 TABLET BY MOUTH EVERY DAY 06/07/17  Yes Richardson Dopp T, PA-C  pravastatin (PRAVACHOL) 40 MG tablet Take 1 tablet (40 mg total) by mouth every evening. 04/10/16  Yes Weaver, Scott T, PA-C  traZODone (DESYREL) 50 MG tablet Take 1 tablet (50 mg total) by mouth at bedtime. 03/09/17  Yes Young, Clinton D, MD  XARELTO 20 MG TABS tablet TAKE 1 TABLET (20 MG TOTAL) BY MOUTH DAILY WITH SUPPER. 05/10/17  Yes Weaver, Scott T, PA-C  atorvastatin (LIPITOR) 10 MG tablet Take 10 mg by mouth daily. 06/08/17   [provider]    Family History Family History  Problem Relation Age of Onset  . Diabetes Mother   . Heart attack Father 18  . Stroke Maternal Grandmother   . Stroke Paternal Grandmother   . Diabetes Paternal Grandfather     Social History Social History  Substance Use Topics  . Smoking status: Current Every Day Smoker    Packs/day: 0.25    Years: 28.00    Last attempt to quit: 10/17/2007  . Smokeless tobacco: Former Systems developer    Types: Snuff     Comment: smokes one pack per month  . Alcohol use Yes     Comment: occasionally - 1 beer 1x/[place]     Allergies   Shrimp [shellfish allergy]; Banana; Watermelon flavor; Other; Peanut-containing drug products; Almond oil; and Sulfa antibiotics   Review of Systems Review of Systems  Constitutional: Positive for fever.  HENT: Negative for sore throat.   Eyes: Negative for redness.  Respiratory: Positive for cough. Negative for shortness of breath.   Cardiovascular: Negative for chest pain.  Gastrointestinal: Negative for abdominal pain, diarrhea and vomiting.  Genitourinary: Negative for flank pain.  Musculoskeletal: Negative for neck pain and neck stiffness.  Skin: Negative for rash.  Neurological:  Negative for weakness, numbness and headaches.  Hematological: Does not bruise/bleed easily.  Psychiatric/Behavioral: Negative for confusion.     Physical Exam Updated Vital Signs BP 101/73 (BP Location: Right Arm)   Pulse 85   Temp 97.6 F (36.4 C) (Oral)   Resp 18   Ht 1.753 m ('5\' 9"'$ )   Wt (!) 144.7 kg (319 lb)   SpO2 99%   BMI 47.11 kg/m   Physical Exam  Constitutional: He appears well-developed and well-nourished. No distress.  HENT:  Mouth/Throat: Oropharynx is clear and moist.  Eyes: Conjunctivae are normal.  Neck: Neck supple. No tracheal deviation present.  Cardiovascular: Normal rate, normal heart sounds and intact distal pulses.  Exam reveals no gallop and no friction rub.   No murmur heard. Irregular rhythm  Pulmonary/Chest: Effort normal. No accessory muscle usage. No respiratory distress. He has rales.  Right upper rales  Abdominal: Soft. He exhibits no distension. There is no tenderness.  Musculoskeletal: He exhibits no edema or tenderness.  Neurological: He is alert.  Skin: Skin is warm and dry. He is not diaphoretic.  Psychiatric: He has a normal mood and affect.  Nursing note and vitals reviewed.    ED Treatments / Results  Labs (all labs ordered are listed, but only abnormal results are displayed) Results for orders placed or performed during the hospital encounter of 43/15/40  Basic metabolic panel  Result Value Ref Range   Sodium 136 135 - 145 mmol/L   Potassium 4.5 3.5 - 5.1 mmol/L   Chloride 99 (L) 101 - 111 mmol/L   CO2 28 22 - 32 mmol/L   Glucose, Bld 222 (H) 65 - 99 mg/dL   BUN 11 6 - 20 mg/dL   Creatinine, Ser 1.19 0.61 - 1.24 mg/dL   Calcium 8.9 8.9 - 10.3 mg/dL   GFR calc non Af Amer >60 >60 mL/min   GFR calc Af Amer >60 >60 mL/min   Anion gap 9 5 - 15  CBC  Result Value Ref Range   WBC 15.4 (H) 4.0 - 10.5 K/uL   RBC 4.80 4.22 - 5.81 MIL/uL   Hemoglobin 13.3 13.0 - 17.0 g/dL   HCT 39.8 39.0 - 52.0 %   MCV 82.9 78.0 - 100.0 fL     MCH 27.7 26.0 - 34.0 pg   MCHC 33.4 30.0 - 36.0 g/dL   RDW 12.9 11.5 - 15.5 %   Platelets 461 (H) 150 - 400 K/uL   Dg Chest 2 View  Result Date: 08/03/2017 CLINICAL DATA:  Cough, shortness of breath. EXAM: CHEST  2 VIEW COMPARISON:  Chest x-ray dated November 03, 2015. FINDINGS: The cardiomediastinal silhouette is normal in size. Normal pulmonary vascularity. Consolidation in the right upper lobe. No pleural effusion or pneumothorax. No acute osseous abnormality. IMPRESSION: Consolidation in the right upper lobe, favoring pneumonia. Followup PA and lateral chest X-ray is recommended in 3-4 weeks following trial of antibiotic therapy to ensure resolution and exclude underlying malignancy. Electronically Signed   By: Titus Dubin [name]   On: 08/03/2017 13:14    EKG  EKG Interpretation  Date/Time:  Friday August 03 2017 12:21:37 EDT Ventricular Rate:  106 PR Interval:    QRS Duration: 74 QT Interval:  344 QTC Calculation: 456 R Axis:   -26 Text Interpretation:  Atrial fibrillation with rapid ventricular response with premature ventricular or aberrantly conducted complexes Minimal voltage criteria for LVH, may be normal variant Septal infarct , age undetermined Abnormal ECG st elevation v2, ?v3, borderline st elevation Confirmed by Pattricia Boss [phone]) on 08/03/2017 12:55:53 PM       Radiology Dg Chest 2 View  Result Date: 08/03/2017 CLINICAL DATA:  Cough, shortness of breath. EXAM: CHEST  2 VIEW COMPARISON:  Chest x-ray dated November 03, 2015. FINDINGS: The cardiomediastinal silhouette is normal in size. Normal pulmonary vascularity. Consolidation in the right upper lobe. No pleural effusion or pneumothorax. No acute osseous abnormality. IMPRESSION: Consolidation in the right upper lobe, favoring pneumonia. Followup PA and lateral chest X-ray is recommended in 3-4 weeks following trial of antibiotic therapy to ensure resolution and exclude underlying malignancy. Electronically Signed    By: Titus Dubin [name]   On: 08/03/2017 13:14    Procedures Procedures (including critical care time)  Medications Ordered in ED Medications  cefTRIAXone (ROCEPHIN) 1 g in dextrose 5 % 50 mL IVPB (not administered)  azithromycin (ZITHROMAX) 500 mg in dextrose 5 % 250 mL IVPB (not administered)     Initial Impression / Assessment and Plan / ED Course  I have reviewed the triage vital signs and the nursing notes.  Pertinent labs & imaging results that were available during my care of the patient were reviewed by me and considered in my medical decision making (see chart for details).  Iv ns. Cxr.  Reviewed nursing notes and prior charts for additional history.   Rocephin iv. zithromax iv.  Recheck pt - no increased wob, pt breathing comfortable. No pain, no faintness or dizziness.   Pt indicates has adequate amt of his prescription/current meds at home, and is compliant w rx.   Pt is eating snack/drinking fluids.  Hr 88. rr 18. Pulse ox 98% room air.   Pt currently appears stable for d/c.   rec close outpt pcp f/u.  Return precautions provided.      Final Clinical Impressions(s) / ED Diagnoses   Final diagnoses:  None    New Prescriptions New Prescriptions   No medications on file         Lajean Saver, MD 08/03/17 [phone]

## 2017-08-03 NOTE — ED Notes (Signed)
Pt left prior to receiving d/c instructions or oral abx. Dc instructions left with front desk.

## 2017-08-19 DIAGNOSIS — Z23 Encounter for immunization: Secondary | ICD-10-CM | POA: Diagnosis not present

## 2017-08-24 ENCOUNTER — Telehealth: Payer: Self-pay | Admitting: Internal Medicine

## 2017-08-24 MED ORDER — AMPHETAMINE-DEXTROAMPHET ER 30 MG PO CP24: 30.0000 mg | ORAL_CAPSULE | Freq: Every day | ORAL | 0 refills | Status: DC | PRN

## 2017-08-24 NOTE — Telephone Encounter (Signed)
rx printed, signed, and placed in brown accordion file for pickup.   lmtcb X1 for pt to make aware.

## 2017-08-24 NOTE — Telephone Encounter (Signed)
Called and spoke with pt and he is needing the refill of the adderall xr 30 mg  1 daily.  thhis was last refilled for #30 on 07/24/17.  CY please advise. Thanks

## 2017-08-24 NOTE — Telephone Encounter (Signed)
Ok to refill Adderall 

## 2017-08-27 NOTE — Telephone Encounter (Signed)
Spoke with pt. He is aware that his prescription is ready to be picked up. Nothing further was needed.

## 2017-09-07 ENCOUNTER — Ambulatory Visit: Payer: Self-pay | Admitting: Internal Medicine

## 2017-09-28 ENCOUNTER — Telehealth: Payer: Self-pay | Admitting: Internal Medicine

## 2017-09-28 NOTE — Telephone Encounter (Signed)
Pt request refill for Adderall '30mg'$  XR. Called pt to advise it may be on Monday before this is done. Pt understood, CY please advise.   Last OV 03/09/2017  Last refill 08/24/2017  Current Outpatient Medications on File Prior to Visit  Medication Sig Dispense Refill  . amphetamine-dextroamphetamine (ADDERALL XR) 30 MG 24 hr capsule Take 1 capsule (30 mg total) daily as needed by mouth (For narcolepsy.). 30 capsule 0  . atorvastatin (LIPITOR) 10 MG tablet Take 10 mg by mouth daily.  3  . Azelastine-Fluticasone 137-50 MCG/ACT SUSP Place 2 puffs into the nose 2 (two) times daily. 1 Bottle 0  . Blood Glucose Monitoring Suppl (ONE TOUCH ULTRA MINI) w/Device KIT Use meter to check blood sugars 1-4 times daily as instructed. 1 each 0  . cefdinir (OMNICEF) 300 MG capsule Take 1 capsule (300 mg total) by mouth 2 (two) times daily. 14 capsule 0  . flecainide (TAMBOCOR) 100 MG tablet TAKE 1 TABLET BY MOUTH TWICE A DAY 15 tablet 0  . glipiZIDE (GLUCOTROL XL) 10 MG 24 hr tablet TAKE 1 TABLET (10 MG TOTAL) BY MOUTH DAILY. 90 tablet 0  . glucose blood (ONE TOUCH ULTRA TEST) test strip Use one strip per test. Test blood sugars 1-4 times daily as instructed. 100 each 12  . Insulin Glargine (BASAGLAR KWIKPEN) 100 UNIT/ML SOPN Inject 0.2 mLs (20 Units total) into the skin at bedtime. 1 pen 0  . Insulin Pen Needle 32G X 4 MM MISC Use 1 needle per injection to inject insulin subcutaneously once daily. 30 each 1  . Lancets Misc. (ONE TOUCH SURESOFT) MISC Use 1 lancet per test. Test blood sugars 1-4 times per day as instructed. 1 each 1  . lisinopril-hydrochlorothiazide (PRINZIDE,ZESTORETIC) 10-12.5 MG per tablet Take 1 tablet by mouth at bedtime.     . metFORMIN (GLUCOPHAGE) 500 MG tablet Take 2 tablets (1,000 mg total) by mouth 2 (two) times daily. 60 tablet 0  . metoprolol succinate (TOPROL-XL) 25 MG 24 hr tablet TAKE 1 TABLET BY MOUTH EVERY DAY 30 tablet 0  . pravastatin (PRAVACHOL) 40 MG tablet Take 1 tablet (40 mg  total) by mouth every evening. 90 tablet 3  . traZODone (DESYREL) 50 MG tablet Take 1 tablet (50 mg total) by mouth at bedtime. 30 tablet 5  . XARELTO 20 MG TABS tablet TAKE 1 TABLET (20 MG TOTAL) BY MOUTH DAILY WITH SUPPER. 30 tablet 0   No current facility-administered medications on file prior to visit.    Allergies  Allergen Reactions  . Shrimp [Shellfish Allergy] Anaphylaxis  . Banana Other (See Comments)    Pt gags  . Watermelon Flavor Nausea And Vomiting  . Other Itching    Grapes  . Peanut-Containing Drug Products Itching and Cough  . Almond Oil Itching    Roof of mouth itches  . Sulfa Antibiotics Itching

## 2017-10-01 MED ORDER — AMPHETAMINE-DEXTROAMPHET ER 30 MG PO CP24
30.0000 mg | ORAL_CAPSULE | Freq: Every day | ORAL | 0 refills | Status: DC | PRN
Start: 2017-10-01 — End: 2017-11-05

## 2017-10-01 NOTE — Telephone Encounter (Signed)
Ok to refill 

## 2017-10-01 NOTE — Telephone Encounter (Signed)
Rx has been printed and placed on CY's cart to be signed. Florentina AddisonKatie will place the rx up front once it's signed. lmtcb  x1 for pt.

## 2017-10-01 NOTE — Telephone Encounter (Signed)
Envelope is up front in folder.  Patient called and was advised this is ready to be picked up. No call back is needed.

## 2017-11-05 ENCOUNTER — Ambulatory Visit (INDEPENDENT_AMBULATORY_CARE_PROVIDER_SITE_OTHER): Payer: 59 | Admitting: Internal Medicine

## 2017-11-05 ENCOUNTER — Encounter: Payer: Self-pay | Admitting: Internal Medicine

## 2017-11-05 DIAGNOSIS — G47411 Narcolepsy with cataplexy: Secondary | ICD-10-CM | POA: Diagnosis not present

## 2017-11-05 DIAGNOSIS — Z720 Tobacco use: Secondary | ICD-10-CM

## 2017-11-05 DIAGNOSIS — G4733 Obstructive sleep apnea (adult) (pediatric): Secondary | ICD-10-CM | POA: Diagnosis not present

## 2017-11-05 MED ORDER — AMPHETAMINE-DEXTROAMPHET ER 30 MG PO CP24: 30.0000 mg | ORAL_CAPSULE | Freq: Every day | ORAL | 0 refills | Status: DC | PRN

## 2017-11-05 NOTE — Patient Instructions (Addendum)
Script refilled for Adderall  Our goal is to use CPAP all night, every night. This will also help you to be alert driving as discussed  We will need you to bring your CPAP machine and power cord by so we can get a download to make sure it is working right.

## 2017-11-05 NOTE — Progress Notes (Signed)
HPI  male smoker followed for management of OSA and narcolepsy with cataplexy, complicated by allergic rhinitis, atrial fibrillation, dCHF, DM, HTN NPSG 11/23/00- AHI 20 per hour. Hypnagogic hallucination associated with dreaming. 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 Vivid dreams as soon as he falls asleep Multiple Sleep Latency Test 10/30/2012-pathologic daytime hypersomnia, nonspecific, compatible with idiopathic hypersomnia or narcolepsy. Mean latency 0.9 minutes with one sleep onset REM event  --------------------------------------------------------------------------------------- 03/09/17- 47 year old male smoker followed for management of OSA and narcolepsy with cataplexy, complicated by allergic rhinitis, atrial fibrillation, dCHF, DM, HTN CPAP 13/[facility] FOLLOW UP FOR OSA  DME  is [facility] patient is non- compliant with his CPAP  the patient states that he feels like it  makes him have bad dreams  Adderall ER 30 mg  He was turned down for disability. Complains Adderall is expensive so he doesn't use it every day. 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 He thinks may be CPAP previously was too high and smothered him but unsure. He would wake feeling smothered but unclear whether pressure was too high or too low. Still bothered by vivid dreams and we discussed trying to suppress some of that.  11/05/17-  47 year old male smoker followed for management of OSA and narcolepsy with cataplexy, complicated by allergic rhinitis, atrial fibrillation, dCHF, DM2, HTN CPAP 13/[facility] -----6 month ROV  "Can't afford BP meds so hasn't taken in last few days", but able to afford 1 ppd cigarettes. Discussed. Adderall XR 30 mg,  Blames poor fitting nasal mask for not using CPAP regularly.  No download available to verify but I think CPAP is 13.  He says he has been sleeping well at night without sleep medicines.  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  He says he has no trouble staying awake as long as he is "engaged", including driving.  If he sits quietly with nothing to do he may doze off pretty quickly.  He does not recall any recent cataplexy type events.  Adderall continues to help and he denies palpitation, headache or concerns.  ROS-see HPI  + = positive Constitutional:   No-   weight loss, night sweats, fevers, chills,  +fatigue, lassitude. HEENT:   No-  headaches, difficulty swallowing, tooth/dental problems, sore throat,       No-  sneezing, itching, ear ache, + nasal congestion, post nasal drip,  CV:  No-   chest pain, orthopnea, PND, swelling in lower extremities, anasarca, dizziness, palpitations Resp: No-   shortness of breath with exertion or at rest.              No-   productive cough,  No non-productive cough,  No- coughing up of blood.              No-   change in color of mucus.  No- wheezing.   Skin: No-   rash or lesions. GI:  No-   heartburn, indigestion, abdominal pain, nausea, vomiting,  GU:  MS:  No-   joint pain or swelling.  Neuro-     +HPI Psych:  No- change in mood or affect. No depression or anxiety.  No memory loss.  OBJ- Physical Exam   General- Alert/ calm, Oriented, Affect-appropriate, Distress- none acute, +obese,  Skin- rash-none, lesions- none, excoriation- none Lymphadenopathy- none Head- atraumatic            Eyes- Gross vision intact, PERRLA, conjunctivae and secretions clear  Ears- Hearing, canals-normal            Nose- rhinitis/ turbinate edema, no-Septal dev, mucus, polyps, erosion, perforation             Throat- Mallampati III , mucosa clear , drainage- none, tonsils- atrophic Neck- flexible , trachea midline, no stridor , thyroid nl, carotid no bruit Chest - symmetrical excursion , unlabored           Heart/CV- RRR , no murmur , no gallop  , no rub, nl s1 s2                           - JVD- none , edema- none, stasis changes- none, varices-  none           Lung- clear to P&A, wheeze- none, cough- none , dullness-none, rub- none           Chest wall-  Abd-  Br/ Gen/ Rectal- Not done, not indicated Extrem- cyanosis- none, clubbing, none, atrophy- none, strength- nl

## 2017-11-06 NOTE — Assessment & Plan Note (Signed)
Pattern seems different.  He says he is sleeping well at night.  He denies cataplexy now and says he only gets sleepy if he sits without anything to do but does well driving.  He continues Adderall with no evidence suggesting misuse.

## 2017-11-06 NOTE — Assessment & Plan Note (Signed)
Unclear how long he has been off CPAP-never very compliant.  We discussed importance of getting this straight.  We will try to get his mask refitted and emphasize compliance with appropriate documentation as a way to support his opinion that his sleep problems are well controlled.

## 2017-11-06 NOTE — Assessment & Plan Note (Signed)
I asked him to spend his cigarette money on his medicines and make a real effort to stop smoking.  At the very least I suggested sugarless gum as an alternative.

## 2017-11-14 ENCOUNTER — Other Ambulatory Visit: Payer: Self-pay | Admitting: Physician Assistant

## 2017-11-14 DIAGNOSIS — I480 Paroxysmal atrial fibrillation: Secondary | ICD-10-CM

## 2017-11-14 NOTE — Telephone Encounter (Signed)
Refill request for Xarelto received, pt has not been seen by Cardiology since 04/07/16 & was due to be seen in 6 months per OV note by Scott, pt's Crea-1.19, Wt-134.7kg, CrCl-146.2 ml/min. Called pt & left a message for pt to callback to schedule an appt.

## 2017-12-07 ENCOUNTER — Telehealth: Payer: Self-pay | Admitting: Internal Medicine

## 2017-12-07 MED ORDER — AMPHETAMINE-DEXTROAMPHET ER 30 MG PO CP24: 30.0000 mg | ORAL_CAPSULE | Freq: Every day | ORAL | 0 refills | Status: DC | PRN

## 2017-12-07 NOTE — Telephone Encounter (Signed)
Spoke with patient. He was requesting a refill on his Adderall XR 30mg .   Last OV: 11/05/17 Next OV: 05/09/18 Last RX 11/05/17 for 30 tablets.   Patient would like to pick up this RX when it is ready.   CY, please advise if this refill is appropriate. Thanks!

## 2017-12-07 NOTE — Telephone Encounter (Signed)
Ok to refill 

## 2017-12-07 NOTE — Telephone Encounter (Signed)
Rx has been signed by CY and placed up front in brown accordion folder for pt to pick up.  Pt has been called and made aware Rx is ready.  Nothing further needed at this current time.

## 2017-12-28 ENCOUNTER — Telehealth: Payer: Self-pay | Admitting: *Deleted

## 2017-12-28 NOTE — Telephone Encounter (Signed)
Patients last office visit here was 04/07/16 and last refill of flecainide was 06/25/17 and was only authorized for a quantity of 15. He did schedule an appointment. Okay to refill flecainide until then? Please advise. Thanks, MI

## 2017-12-28 NOTE — Telephone Encounter (Signed)
Per message below patient has been out of his Flecainide since last Saturday. Patient was last seen by Tereso NewcomerScott Weaver, PA on 04/07/16. Patient's last Flecainide Rx was last ordered on 06/25/17 for 15 tablets. Patient was scheduled for a follow-up appointment by scheduler for 02/11/18 at 4:00 PM with Dr. Eldridge DaceVaranasi. Attempted to contact patient to discuss flecainide refill and moving his appointment sooner. There was no answer. Left message for patient to call back. I suspect patient has not taken Flecainide in a while and I do not feel comfortable refilling the Rx until his appointment on 4/29.

## 2017-12-28 NOTE — Telephone Encounter (Signed)
Spoke with pt in regards to his follow up with MD as he is past due, and we have a refill for his Xarelto. He states he has Xarelto but he ran out of his Flecainide last Saturday. He is aware that he needs a MD appt. Thus, transferred pt to scheduler to schedule a MD appt. Pt is requesting a refill on his Flecainide sent to [facility] on [address]. Please follow up with pt.

## 2017-12-31 NOTE — Telephone Encounter (Signed)
Not seen since 2017.  He needs follow up.  I am not comfortable restarting Flecainide without him being seen.  Defer to Dr. Everette RankJay Varanasi. Tereso NewcomerScott Kruti Horacek, PA-C    12/31/2017 3:52 PM

## 2017-12-31 NOTE — Telephone Encounter (Signed)
Attempted to reach patient again but there was no answer. Left message for patient to call back.

## 2018-01-03 NOTE — Telephone Encounter (Signed)
Spoke to patient and made him aware that we will not refill his Flecainide until he is seen and evaluated since he was last seen in 2017. Made patient a sooner appointment on 3/26 at 10:40 AM. Patient verbalized understanding and is in agreement with this plan.

## 2018-01-07 ENCOUNTER — Telehealth: Payer: Self-pay | Admitting: Internal Medicine

## 2018-01-07 MED ORDER — AMPHETAMINE-DEXTROAMPHET ER 30 MG PO CP24: 30.0000 mg | ORAL_CAPSULE | Freq: Every day | ORAL | 0 refills | Status: DC | PRN

## 2018-01-07 NOTE — Telephone Encounter (Signed)
Rx has been signed and placed up front for pick up. Pt is aware. Nothing further was needed. 

## 2018-01-07 NOTE — Telephone Encounter (Signed)
Ok to refill 

## 2018-01-07 NOTE — Telephone Encounter (Signed)
Rx printed and placed on CY's cart for signature 

## 2018-01-07 NOTE — Telephone Encounter (Signed)
Pt is requesting a refill on Adderall XR 30mg . His last OV with CY was on 11/05/17. He has a pending OV with CY on 05/09/18. The last refill was on 12/07/17 #30.  CY - please advise on refill. Thanks.

## 2018-01-08 ENCOUNTER — Ambulatory Visit (INDEPENDENT_AMBULATORY_CARE_PROVIDER_SITE_OTHER): Payer: 59 | Admitting: Interventional Cardiology

## 2018-01-08 ENCOUNTER — Encounter (INDEPENDENT_AMBULATORY_CARE_PROVIDER_SITE_OTHER): Payer: Self-pay

## 2018-01-08 ENCOUNTER — Encounter: Payer: Self-pay | Admitting: Interventional Cardiology

## 2018-01-08 VITALS — BP 118/92 | HR 92 | Ht 69.0 in | Wt 283.2 lb

## 2018-01-08 DIAGNOSIS — E785 Hyperlipidemia, unspecified: Secondary | ICD-10-CM | POA: Diagnosis not present

## 2018-01-08 DIAGNOSIS — I119 Hypertensive heart disease without heart failure: Secondary | ICD-10-CM

## 2018-01-08 DIAGNOSIS — Z720 Tobacco use: Secondary | ICD-10-CM | POA: Diagnosis not present

## 2018-01-08 DIAGNOSIS — Z7901 Long term (current) use of anticoagulants: Secondary | ICD-10-CM | POA: Diagnosis not present

## 2018-01-08 DIAGNOSIS — I480 Paroxysmal atrial fibrillation: Secondary | ICD-10-CM

## 2018-01-08 LAB — BASIC METABOLIC PANEL
BUN / CREAT RATIO: 14 (ref 9–20)
BUN: 18 mg/dL (ref 6–24)
CO2: 25 mmol/L (ref 20–29)
CREATININE: 1.28 mg/dL — AB (ref 0.76–1.27)
Calcium: 9.2 mg/dL (ref 8.7–10.2)
Chloride: 94 mmol/L — ABNORMAL LOW (ref 96–106)
GFR calc Af Amer: 77 mL/min/{1.73_m2} (ref >59)
GFR calc non Af Amer: 66 mL/min/{1.73_m2} (ref >59)
GLUCOSE: 415 mg/dL — AB (ref 65–99)
Potassium: 4.6 mmol/L (ref 3.5–5.2)
SODIUM: 134 mmol/L (ref 134–144)

## 2018-01-08 LAB — CBC
HEMOGLOBIN: 15.3 g/dL (ref 13.0–17.7)
Hematocrit: 45.4 % (ref 37.5–51.0)
MCH: 29 pg (ref 26.6–33.0)
MCHC: 33.7 g/dL (ref 31.5–35.7)
MCV: 86 fL (ref 79–97)
PLATELETS: 335 10*3/uL (ref 150–379)
RBC: 5.28 x10E6/uL (ref 4.14–5.80)
RDW: 14 % (ref 12.3–15.4)
WBC: 7.7 10*3/uL (ref 3.4–10.8)

## 2018-01-08 MED ORDER — RIVAROXABAN 20 MG PO TABS: ORAL_TABLET | ORAL | 1 refills | Status: DC

## 2018-01-08 MED ORDER — FLECAINIDE ACETATE 100 MG PO TABS: 100.0000 mg | ORAL_TABLET | Freq: Two times a day (BID) | ORAL | 3 refills | Status: DC

## 2018-01-08 MED ORDER — METOPROLOL SUCCINATE ER 25 MG PO TB24: 25.0000 mg | ORAL_TABLET | Freq: Every day | ORAL | 3 refills | Status: DC

## 2018-01-08 NOTE — Progress Notes (Signed)
Cardiology Office Note   Date:  01/08/2018   ID:  [name], DOB 1971/06/28, MRN [id number]  PCP:  Renford Dills, MD    No chief complaint on file.  AFib  Wt Readings from Last 3 Encounters:  01/08/18 283 lb 3.2 oz (128.5 kg)  11/05/17 297 lb (134.7 kg)  08/03/17 (!) 319 lb (144.7 kg)       History of Present Illness: [name] is a 47 y.o. male  with a hx of HTN, DM2, obesity, OSA on CPAP, prior ETOH/cocaine use and PAF. Remotely seen by Dr. Reyes Ivan in the past. He was evaluated by Dr. Delane Ginger in 4/14 in the hospital during an admission with PAF with RVR.  Ritalin was a drug to be avoided in the futuresince the AFib started with medicine. He converted to NSR and long-term anticoagulation was recommended given his stroke risk. Flecainide was added to maintain NSR. He was to follow up in this office for ETT to rule out pro-arrhythmia, but was not seen here until 2/16. Review of his chart indicates he had an ETT at [facility] with Dr. Everette Rank in 02/2013.  When last seen here he was placed back on Flecainide and FU ETT-Myoview was low risk and neg for pro-arrhythmia.    CHADS2-VASc=2 (HTN, DM)  Patient has been taking Adderall for narcolepsy.  Now retired from his job with the city due to narcolepsy.  Denies : Chest pain. Dizziness. Leg edema. Nitroglycerin use. Orthopnea. Palpitations. Paroxysmal nocturnal dyspnea. Shortness of breath. Syncope.    Past Medical History:  Diagnosis Date  . Diabetes mellitus   . GERD (gastroesophageal reflux disease)   . History of alcohol abuse   . History of cocaine use   . History of echocardiogram    a. Echo 4/14: Moderate LVH, vigorous LVEF, EF 65-70%, normal wall motion, grade 2 diastolic dysfunction, mildly dilated aortic root and ascending aorta, ascending aorta 40 mm, aortic root 38 mm, mild LAE  . Hx of cardiovascular stress test    a. GXT 5/14: No ischemic changes  //  b. ETT-Myoview 3/16:  Low risk,  no ischemia, EF 58%  . Hyperlipidemia   . Hypertension   . Morbid obesity (HCC)   . Obesity   . Paroxysmal atrial fibrillation (HCC)    Occurring in 2008, with several recurrence since then (including in the setting of + cocaine on UDS).  . Sleep apnea     Past Surgical History:  Procedure Laterality Date  . APPENDECTOMY    . ROTATOR CUFF REPAIR    . TONSILLECTOMY       Current Outpatient Medications  Medication Sig Dispense Refill  . amphetamine-dextroamphetamine (ADDERALL XR) 30 MG 24 hr capsule Take 1 capsule (30 mg total) by mouth daily as needed (For narcolepsy.). 30 capsule 0  . atorvastatin (LIPITOR) 10 MG tablet Take 10 mg by mouth daily.  3  . flecainide (TAMBOCOR) 100 MG tablet TAKE 1 TABLET BY MOUTH TWICE A DAY 15 tablet 0  . glipiZIDE (GLUCOTROL XL) 10 MG 24 hr tablet TAKE 1 TABLET (10 MG TOTAL) BY MOUTH DAILY. 90 tablet 0  . lisinopril-hydrochlorothiazide (PRINZIDE,ZESTORETIC) 10-12.5 MG per tablet Take 1 tablet by mouth at bedtime.     . metoprolol succinate (TOPROL-XL) 25 MG 24 hr tablet TAKE 1 TABLET BY MOUTH EVERY DAY 30 tablet 0  . pravastatin (PRAVACHOL) 40 MG tablet Take 1 tablet (40 mg total) by mouth every evening. 90 tablet 3  .  traZODone (DESYREL) 50 MG tablet Take 1 tablet (50 mg total) by mouth at bedtime. 30 tablet 5  . XARELTO 20 MG TABS tablet TAKE 1 TABLET (20 MG TOTAL) BY MOUTH DAILY WITH SUPPER. 30 tablet 0   No current facility-administered medications for this visit.     Allergies:   Shrimp [shellfish allergy]; Banana; Watermelon flavor; Other; Peanut-containing drug products; Almond oil; and Sulfa antibiotics    Social History:  The patient  reports that he has been smoking.  He has a 28.00 pack-year smoking history. He has quit using smokeless tobacco. His smokeless tobacco use included snuff. He reports that he drinks alcohol. He reports that he does not use drugs.   Family History:  The patient's family history includes Diabetes in his  mother and paternal grandfather; Heart attack (age of onset: 10037) in his father; Stroke in his maternal grandmother and paternal grandmother.    ROS:  Please see the history of present illness.   Otherwise, review of systems are positive for weight loss.   All other systems are reviewed and negative.    PHYSICAL EXAM: VS:  BP (!) 118/92   Pulse 92   Ht 5\' 9"  (1.753 m)   Wt 283 lb 3.2 oz (128.5 kg)   SpO2 98%   BMI 41.82 kg/m  , BMI Body mass index is 41.82 kg/m. GEN: Well nourished, well developed, in no acute distress  HEENT: normal  Neck: no JVD, carotid bruits, or masses Cardiac: RRR; 2/6 systolic murmur, no rubs, or gallops,no edema  Respiratory:  clear to auscultation bilaterally, normal work of breathing GI: soft, nontender, nondistended, + BS MS: no deformity or atrophy  Skin: warm and dry, no rash Neuro:  Strength and sensation are intact Psych: euthymic mood, full affect   EKG:   The ekg ordered today demonstrates NSR, LVH, nonspecific ST changes   Recent Labs: 08/03/2017: BUN 11; Creatinine, Ser 1.19; Hemoglobin 13.3; Platelets 461; Potassium 4.5; Sodium 136   Lipid Panel    Component Value Date/Time   CHOL 224 (H) 12/08/2016 0852   TRIG (H) 12/08/2016 0852    435.0 Triglyceride is over 400; calculations on Lipids are invalid.   HDL 32.50 (L) 12/08/2016 0852   CHOLHDL 7 12/08/2016 0852   VLDL 65 (H) 04/07/2016 0851   LDLCALC 57 04/07/2016 0851   LDLDIRECT 103.0 12/08/2016 0852     Other studies Reviewed: Additional studies/ records that were reviewed today with results demonstrating: 2014 echo showed hyperdynamic LV function..   ASSESSMENT AND PLAN:  1. AFib: Restart flecainide 100 mg BID.  ETT in one week.  Refill metoprolol as well.  LDL 81 in 8/18.  Continue atorvastatin for hyperlipidemia 2. Anticoagulated: Refill Xarelto for stroke prevention.  CBC and Bmet today.  No bleeding problems. 3. Murmur: Likely due to hyperdynamic LV function.   4. Hypertensive heart disease: Systolic blood pressure well controlled.  Continue efforts at weight loss.  This will help blood pressure as well. 5. Diabetes: A1c 13.3 in August 2018.  He needs to try to improve his diet and continue to lose weight. 6. Avoid all tobacco products.   Current medicines are reviewed at length with the patient today.  The patient concerns regarding his medicines were addressed.  The following changes have been made:  No change  Labs/ tests ordered today include:  No orders of the defined types were placed in this encounter.   Recommend 150 minutes/week of aerobic exercise Low fat, low carb, high fiber  diet recommended  Disposition:   FU in 1 year   Signed, Lance Muss, MD  01/08/2018 11:09 AM    [facility] [address] [place], [place], [place]  [address] Phone: [phone]; Fax: [phone]

## 2018-01-08 NOTE — Patient Instructions (Addendum)
Medication Instructions:  Your physician recommends that you continue on your current medications as directed. Please refer to the Current Medication list given to you today.  1. Restart Flecainide 100 mg twice a day  2. Restart Xarelto 20 mg once daily with supper  3. Restart Metoprolol Succinate 25 mg once daily  Labwork: TODAY: CBC, BMET  Testing/Procedures: Your physician has requested that you have an exercise tolerance test 1 week after starting Flecainide. For further information please visit [email or web address] Please also follow instruction sheet, as given.   Follow-Up: Your physician wants you to follow-up in: 1 year with Dr. Eldridge DaceVaranasi. You will receive a reminder letter in the mail two months in advance. If you don't receive a letter, please call our office to schedule the follow-up appointment.   Any Other Special Instructions Will Be Listed Below (If Applicable).     If you need a refill on your cardiac medications before your next appointment, please call your pharmacy.

## 2018-01-24 ENCOUNTER — Telehealth: Payer: Self-pay

## 2018-01-24 NOTE — Telephone Encounter (Signed)
In reviewing the patient's chart in follow-up I saw that the patient cancelled his Flecainide Treadmill appointment that was scheduled for 4/4. Attempted to reach out to the patient, but there was no answer. Left a detailed message for patient on his VM (DPR on file) instructed him to call back and reschedule his Flecainide treadmill as soon as possible.

## 2018-01-25 NOTE — Telephone Encounter (Signed)
Attempted to contact patient again regarding cancelling his Flecainide treadmill appointment, but there was no answer. Left message for patient to call back.

## 2018-01-28 NOTE — Telephone Encounter (Signed)
Called and spoke to patient regarding cancelling his Flecainide Treadmill test. Patient states that he had to cancel because something came up and he forgot to call back to reschedule. Patient states that he did not start taking his Flecainide until May 1st. Patient's stress test rescheduled for 4/18.

## 2018-02-08 ENCOUNTER — Ambulatory Visit (INDEPENDENT_AMBULATORY_CARE_PROVIDER_SITE_OTHER): Payer: 59

## 2018-02-08 ENCOUNTER — Telehealth: Payer: Self-pay | Admitting: Internal Medicine

## 2018-02-08 DIAGNOSIS — I480 Paroxysmal atrial fibrillation: Secondary | ICD-10-CM | POA: Diagnosis not present

## 2018-02-08 DIAGNOSIS — I119 Hypertensive heart disease without heart failure: Secondary | ICD-10-CM

## 2018-02-08 LAB — EXERCISE TOLERANCE TEST
CHL RATE OF PERCEIVED EXERTION: 16
CSEPED: 5 min
CSEPHR: 87 %
Estimated workload: 7 METS
Exercise duration (sec): 30 s
MPHR: 173 {beats}/min
Peak HR: 151 {beats}/min
Rest HR: 90 {beats}/min

## 2018-02-08 MED ORDER — AMPHETAMINE-DEXTROAMPHET ER 30 MG PO CP24: 30.0000 mg | ORAL_CAPSULE | Freq: Every day | ORAL | 0 refills | Status: DC | PRN

## 2018-02-08 NOTE — Telephone Encounter (Signed)
Rx refill request Adderall 30mg  capsule. CY please advise.  Last OV  11/05/2017 Last RX  01/07/2018  Current Outpatient Medications on File Prior to Visit  Medication Sig Dispense Refill  . amphetamine-dextroamphetamine (ADDERALL XR) 30 MG 24 hr capsule Take 1 capsule (30 mg total) by mouth daily as needed (For narcolepsy.). 30 capsule 0  . atorvastatin (LIPITOR) 10 MG tablet Take 10 mg by mouth daily.  3  . flecainide (TAMBOCOR) 100 MG tablet Take 1 tablet (100 mg total) by mouth 2 (two) times daily. 180 tablet 3  . glipiZIDE (GLUCOTROL XL) 10 MG 24 hr tablet TAKE 1 TABLET (10 MG TOTAL) BY MOUTH DAILY. 90 tablet 0  . lisinopril-hydrochlorothiazide (PRINZIDE,ZESTORETIC) 10-12.5 MG per tablet Take 1 tablet by mouth at bedtime.     . metoprolol succinate (TOPROL-XL) 25 MG 24 hr tablet Take 1 tablet (25 mg total) by mouth daily. 90 tablet 3  . pravastatin (PRAVACHOL) 40 MG tablet Take 1 tablet (40 mg total) by mouth every evening. 90 tablet 3  . rivaroxaban (XARELTO) 20 MG TABS tablet TAKE 1 TABLET (20 MG TOTAL) BY MOUTH DAILY WITH SUPPER. 90 tablet 1  . traZODone (DESYREL) 50 MG tablet Take 1 tablet (50 mg total) by mouth at bedtime. 30 tablet 5   No current facility-administered medications on file prior to visit.    Allergies  Allergen Reactions  . Shrimp [Shellfish Allergy] Anaphylaxis  . Banana Other (See Comments)    Pt gags  . Watermelon Flavor Nausea And Vomiting  . Other Itching    Grapes  . Peanut-Containing Drug Products Itching and Cough  . Almond Oil Itching    Roof of mouth itches  . Sulfa Antibiotics Itching

## 2018-02-08 NOTE — Telephone Encounter (Signed)
Spoke with pt, advised Rx ready to be picked up. Nothing further is needed.

## 2018-02-08 NOTE — Telephone Encounter (Signed)
Rx printed

## 2018-02-08 NOTE — Telephone Encounter (Signed)
Ok to refill 

## 2018-02-11 ENCOUNTER — Ambulatory Visit: Payer: 59 | Admitting: Interventional Cardiology

## 2018-03-12 ENCOUNTER — Telehealth: Payer: Self-pay | Admitting: Internal Medicine

## 2018-03-12 MED ORDER — AMPHETAMINE-DEXTROAMPHET ER 30 MG PO CP24: 30.0000 mg | ORAL_CAPSULE | Freq: Every day | ORAL | 0 refills | Status: DC | PRN

## 2018-03-12 NOTE — Telephone Encounter (Signed)
Refill request for Adderall . CY please advise.   Last RX: 02/08/2018 Last OV 11/05/17  Current Outpatient Medications on File Prior to Visit  Medication Sig Dispense Refill  . amphetamine-dextroamphetamine (ADDERALL XR) 30 MG 24 hr capsule Take 1 capsule (30 mg total) by mouth daily as needed (For narcolepsy.). 30 capsule 0  . atorvastatin (LIPITOR) 10 MG tablet Take 10 mg by mouth daily.  3  . flecainide (TAMBOCOR) 100 MG tablet Take 1 tablet (100 mg total) by mouth 2 (two) times daily. 180 tablet 3  . glipiZIDE (GLUCOTROL XL) 10 MG 24 hr tablet TAKE 1 TABLET (10 MG TOTAL) BY MOUTH DAILY. 90 tablet 0  . lisinopril-hydrochlorothiazide (PRINZIDE,ZESTORETIC) 10-12.5 MG per tablet Take 1 tablet by mouth at bedtime.     . metoprolol succinate (TOPROL-XL) 25 MG 24 hr tablet Take 1 tablet (25 mg total) by mouth daily. 90 tablet 3  . pravastatin (PRAVACHOL) 40 MG tablet Take 1 tablet (40 mg total) by mouth every evening. 90 tablet 3  . rivaroxaban (XARELTO) 20 MG TABS tablet TAKE 1 TABLET (20 MG TOTAL) BY MOUTH DAILY WITH SUPPER. 90 tablet 1  . traZODone (DESYREL) 50 MG tablet Take 1 tablet (50 mg total) by mouth at bedtime. 30 tablet 5   No current facility-administered medications on file prior to visit.    Allergies  Allergen Reactions  . Shrimp [Shellfish Allergy] Anaphylaxis  . Banana Other (See Comments)    Pt gags  . Watermelon Flavor Nausea And Vomiting  . Other Itching    Grapes  . Peanut-Containing Drug Products Itching and Cough  . Almond Oil Itching    Roof of mouth itches  . Sulfa Antibiotics Itching

## 2018-03-12 NOTE — Telephone Encounter (Signed)
rx printed, signed, and placed up front for pickup. Pt aware.  Nothing further needed.  

## 2018-03-12 NOTE — Telephone Encounter (Signed)
Ok to refill 

## 2018-03-12 NOTE — Telephone Encounter (Signed)
Attempted to call patient, no answer, left message to call back.  

## 2018-03-12 NOTE — Telephone Encounter (Signed)
Pt is returning call. Cb is [phone].

## 2018-03-12 NOTE — Telephone Encounter (Signed)
Patient returned call, [phone].

## 2018-04-09 ENCOUNTER — Telehealth: Payer: Self-pay | Admitting: Internal Medicine

## 2018-04-09 NOTE — Telephone Encounter (Signed)
[name] is not in the office for the rest of the week. I wanted to see if he had enough to last until then. Left message to call back to let us know. Will await return call.

## 2018-04-10 NOTE — Telephone Encounter (Signed)
ATC pt, no answer. Left message for pt to call back.  

## 2018-04-11 ENCOUNTER — Telehealth: Payer: Self-pay | Admitting: Internal Medicine

## 2018-04-11 NOTE — Telephone Encounter (Signed)
Called spoke with patient who verified he needs a refill on his Adderall 30mg  qd prn Patient is aware that CY is not in the office this week and will return Monday 7.1.19 Patient stated that he does have enough Adderall to last until then  Routing to Dr Maple HudsonYoung to advise if okay to refill the Adderall 30mg    Last office visit 1.21.19: Instructions  Return in about 6 months (around 05/05/2018).  Script refilled for Adderall   Our goal is to use CPAP all night, every night. This will also help you to be alert driving as discussed   We will need you to bring your CPAP machine and power cord by so we can get a download to make sure it is working right.     Patient is scheduled for follow up 7.25.19 w/ CDY

## 2018-04-11 NOTE — Telephone Encounter (Signed)
LMOM TBC x3  Per office protocol, will sign off on message and await pt to call the office back

## 2018-04-15 MED ORDER — AMPHETAMINE-DEXTROAMPHET ER 30 MG PO CP24: 30.0000 mg | ORAL_CAPSULE | Freq: Every day | ORAL | 0 refills | Status: DC | PRN

## 2018-04-15 NOTE — Telephone Encounter (Signed)
Per previous message, patient wishes to pick up RX. Left detailed message for patient stating that the RX has been signed. Will place the RX up front for pickup.   Will close this encounter.

## 2018-04-15 NOTE — Telephone Encounter (Signed)
RX has been printed and placed on CY's cart for signature. After RX is signed, will need to see if the patient wishes to pick up RX or have it mailed to him.

## 2018-04-15 NOTE — Telephone Encounter (Signed)
Ok to refill adderall 

## 2018-05-09 ENCOUNTER — Ambulatory Visit: Payer: 59 | Admitting: Internal Medicine

## 2018-06-18 ENCOUNTER — Ambulatory Visit: Payer: 59 | Admitting: Internal Medicine

## 2018-09-23 ENCOUNTER — Ambulatory Visit: Payer: 59 | Admitting: Internal Medicine

## 2018-11-30 ENCOUNTER — Emergency Department (HOSPITAL_COMMUNITY)
Admission: EM | Admit: 2018-11-30 | Discharge: 2018-11-30 | Disposition: A | Discharge status: Home / Self Care | Payer: 59 | Attending: Emergency Medicine | Admitting: Emergency Medicine

## 2018-11-30 ENCOUNTER — Encounter (HOSPITAL_COMMUNITY): Payer: Self-pay

## 2018-11-30 ENCOUNTER — Other Ambulatory Visit: Payer: Self-pay

## 2018-11-30 ENCOUNTER — Emergency Department (HOSPITAL_COMMUNITY): Payer: 59

## 2018-11-30 DIAGNOSIS — Z79899 Other long term (current) drug therapy: Secondary | ICD-10-CM | POA: Insufficient documentation

## 2018-11-30 DIAGNOSIS — K529 Noninfective gastroenteritis and colitis, unspecified: Secondary | ICD-10-CM | POA: Insufficient documentation

## 2018-11-30 DIAGNOSIS — R1032 Left lower quadrant pain: Secondary | ICD-10-CM | POA: Diagnosis not present

## 2018-11-30 DIAGNOSIS — I10 Essential (primary) hypertension: Secondary | ICD-10-CM | POA: Diagnosis not present

## 2018-11-30 DIAGNOSIS — R7989 Other specified abnormal findings of blood chemistry: Secondary | ICD-10-CM | POA: Diagnosis not present

## 2018-11-30 DIAGNOSIS — F17210 Nicotine dependence, cigarettes, uncomplicated: Secondary | ICD-10-CM | POA: Insufficient documentation

## 2018-11-30 DIAGNOSIS — E119 Type 2 diabetes mellitus without complications: Secondary | ICD-10-CM | POA: Insufficient documentation

## 2018-11-30 DIAGNOSIS — Z91010 Allergy to peanuts: Secondary | ICD-10-CM | POA: Diagnosis not present

## 2018-11-30 DIAGNOSIS — R109 Unspecified abdominal pain: Secondary | ICD-10-CM | POA: Diagnosis present

## 2018-11-30 DIAGNOSIS — R112 Nausea with vomiting, unspecified: Secondary | ICD-10-CM | POA: Diagnosis not present

## 2018-11-30 DIAGNOSIS — E875 Hyperkalemia: Secondary | ICD-10-CM | POA: Diagnosis not present

## 2018-11-30 LAB — URINALYSIS, ROUTINE W REFLEX MICROSCOPIC
Bacteria, UA: NONE SEEN
Bilirubin Urine: NEGATIVE
Ketones, ur: NEGATIVE mg/dL
Leukocytes,Ua: NEGATIVE
NITRITE: NEGATIVE
PH: 5 (ref 5.0–8.0)
Protein, ur: 100 mg/dL — AB
SPECIFIC GRAVITY, URINE: 1.02 (ref 1.005–1.030)

## 2018-11-30 LAB — BASIC METABOLIC PANEL
Anion gap: 11 (ref 5–15)
BUN: 24 mg/dL — ABNORMAL HIGH (ref 6–20)
CALCIUM: 8.7 mg/dL — AB (ref 8.9–10.3)
CO2: 23 mmol/L (ref 22–32)
CREATININE: 1.36 mg/dL — AB (ref 0.61–1.24)
Chloride: 99 mmol/L (ref 98–111)
GFR calc non Af Amer: >60 mL/min (ref >60)
Glucose, Bld: 152 mg/dL — ABNORMAL HIGH (ref 70–99)
Potassium: 4.6 mmol/L (ref 3.5–5.1)
Sodium: 133 mmol/L — ABNORMAL LOW (ref 135–145)

## 2018-11-30 LAB — COMPREHENSIVE METABOLIC PANEL
ALT: 23 U/L (ref 0–44)
AST: 19 U/L (ref 15–41)
Albumin: 3.6 g/dL (ref 3.5–5.0)
Alkaline Phosphatase: 62 U/L (ref 38–126)
Anion gap: 8 (ref 5–15)
BILIRUBIN TOTAL: 0.5 mg/dL (ref 0.3–1.2)
BUN: 25 mg/dL — ABNORMAL HIGH (ref 6–20)
CO2: 29 mmol/L (ref 22–32)
Calcium: 9.3 mg/dL (ref 8.9–10.3)
Chloride: 98 mmol/L (ref 98–111)
Creatinine, Ser: 1.71 mg/dL — ABNORMAL HIGH (ref 0.61–1.24)
GFR calc Af Amer: 54 mL/min — ABNORMAL LOW (ref >60)
GFR, EST NON AFRICAN AMERICAN: 46 mL/min — AB (ref >60)
Glucose, Bld: 224 mg/dL — ABNORMAL HIGH (ref 70–99)
Potassium: 5.6 mmol/L — ABNORMAL HIGH (ref 3.5–5.1)
Sodium: 135 mmol/L (ref 135–145)
TOTAL PROTEIN: 7.5 g/dL (ref 6.5–8.1)

## 2018-11-30 LAB — CBC
HCT: 48.2 % (ref 39.0–52.0)
HEMOGLOBIN: 15.6 g/dL (ref 13.0–17.0)
MCH: 27.1 pg (ref 26.0–34.0)
MCHC: 32.4 g/dL (ref 30.0–36.0)
MCV: 83.8 fL (ref 80.0–100.0)
Platelets: 380 10*3/uL (ref 150–400)
RBC: 5.75 MIL/uL (ref 4.22–5.81)
RDW: 12.8 % (ref 11.5–15.5)
WBC: 10.6 10*3/uL — AB (ref 4.0–10.5)
nRBC: 0 % (ref 0.0–0.2)

## 2018-11-30 LAB — LIPASE, BLOOD: Lipase: 27 U/L (ref 11–51)

## 2018-11-30 IMAGING — CT CT ABD-PELV W/ CM
2 of 5 series · 16 of 46 positions shown, 18 images · IV contrast (APPLIED)
Comparison: CT pelvis dated [DATE].

CLINICAL DATA: LEFT lower quadrant abdominal pain and nausea for 1
week. Diverticulitis suspected.

EXAM:
CT ABDOMEN AND PELVIS WITH CONTRAST
TECHNIQUE: Multidetector CT imaging of the abdomen and pelvis was performed
using the standard protocol following bolus administration of
intravenous contrast.
CONTRAST:  100mL OMNIPAQUE IOHEXOL 300 MG/ML  SOLN

[Series 3: abd/ pelvis 5.0 i30f 2 · axial · 0.98mm/px · z∈[+839,+1279]mm · 13 of 100 slices shown, 15 images]
[im 6/100  soft-tissue]
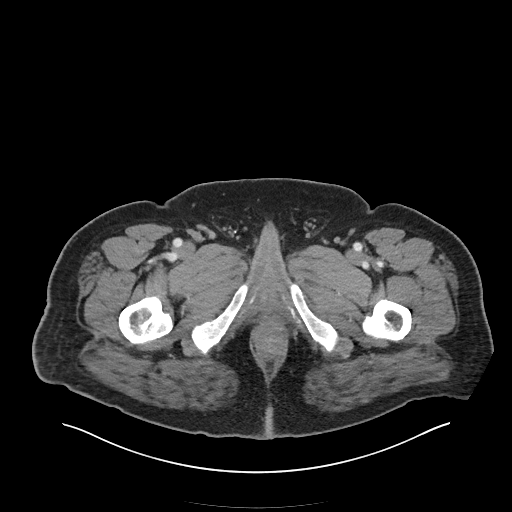
[im 6/100  bone]
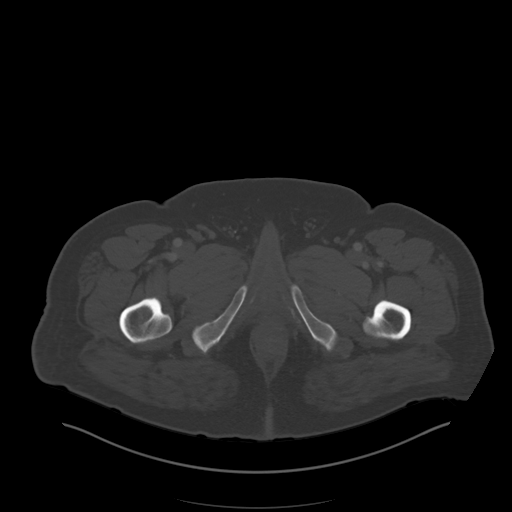
[im 12/100  soft-tissue]
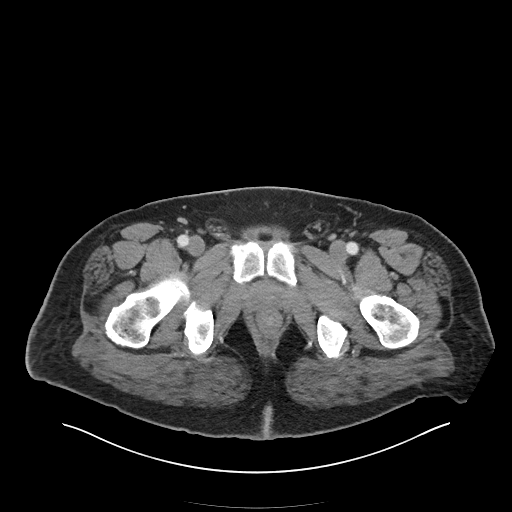
[im 23/100  soft-tissue]
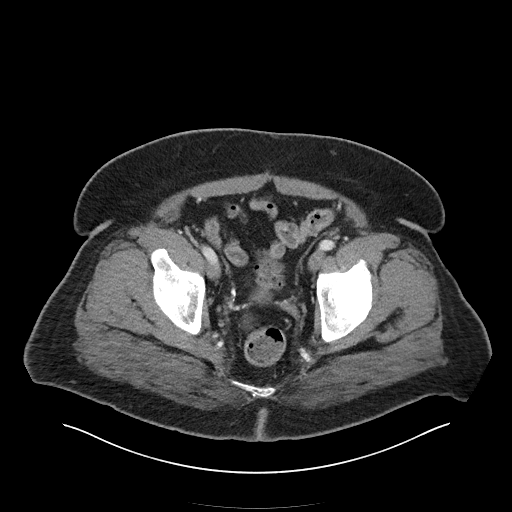
[im 28/100  soft-tissue]
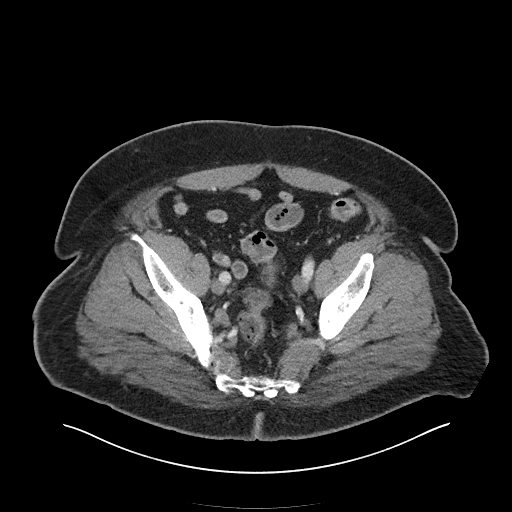
[im 34/100  soft-tissue]
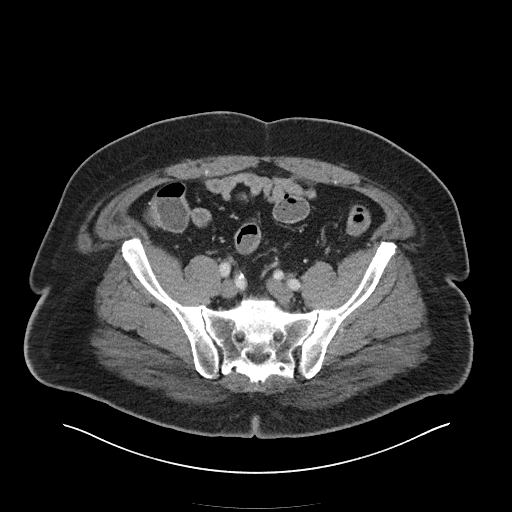
[im 45/100  soft-tissue]
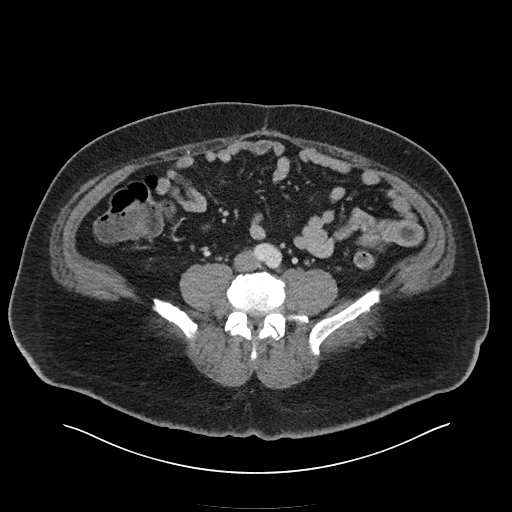
[im 50/100  soft-tissue]
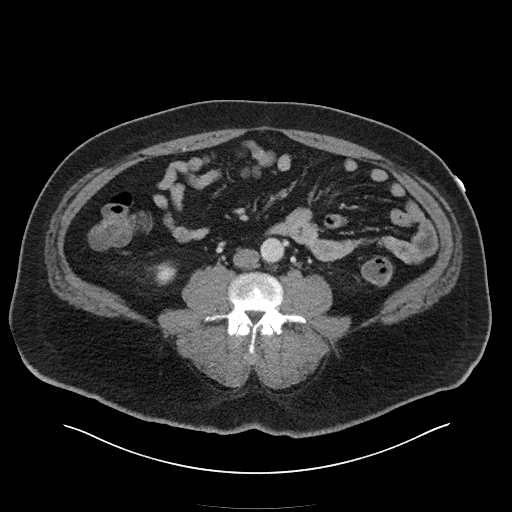
[im 56/100  soft-tissue]
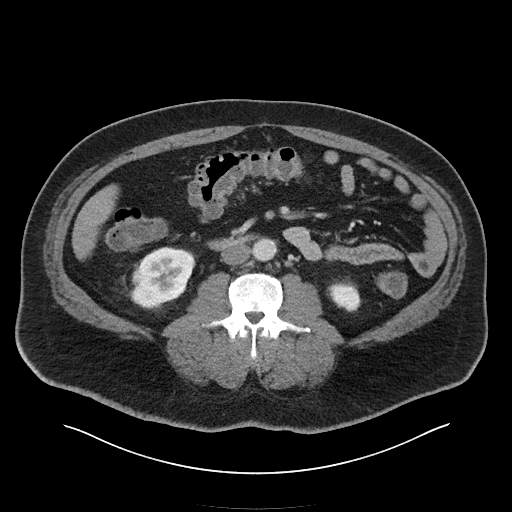
[im 67/100  soft-tissue]
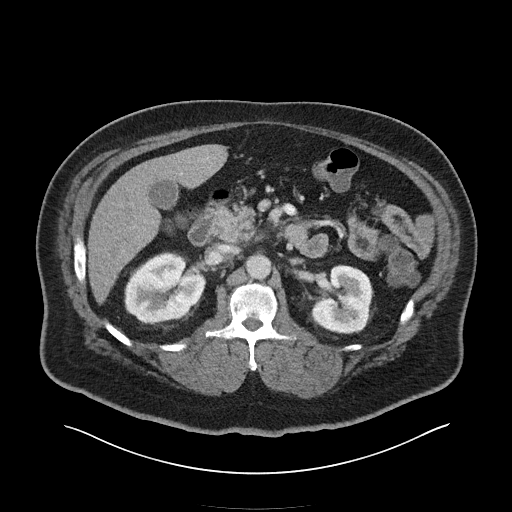
[im 67/100  bone]
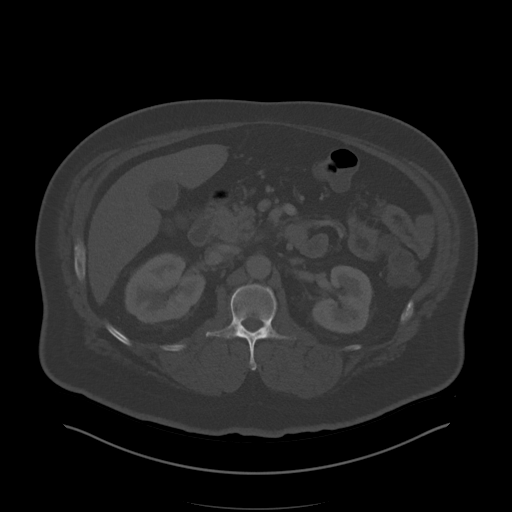
[im 72/100  soft-tissue]
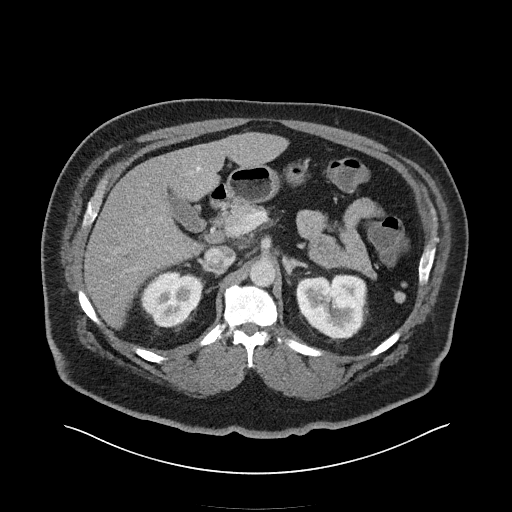
[im 78/100  soft-tissue]
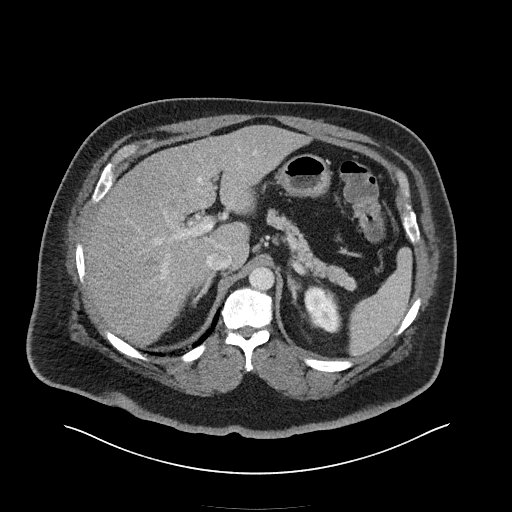
[im 89/100  soft-tissue]
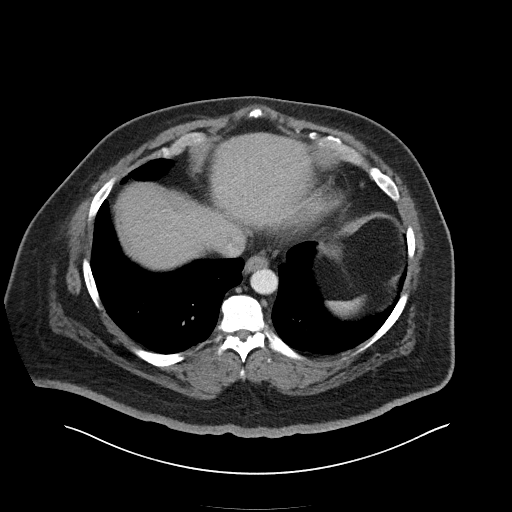
[im 94/100  soft-tissue]
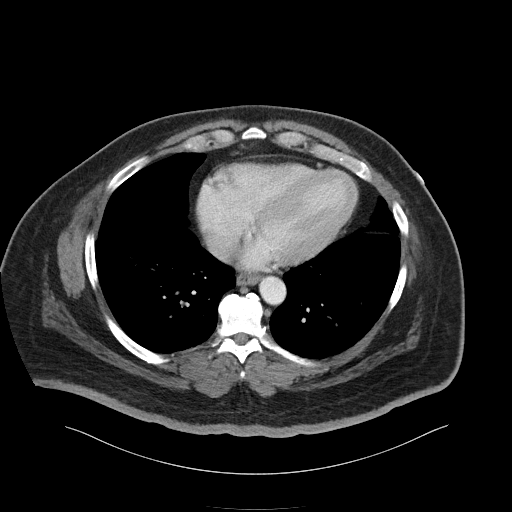

[Series 6: coronal soft tissue · coronal · 0.98mm/px · 3 of 123 slices shown]
[im 41/123  soft-tissue]
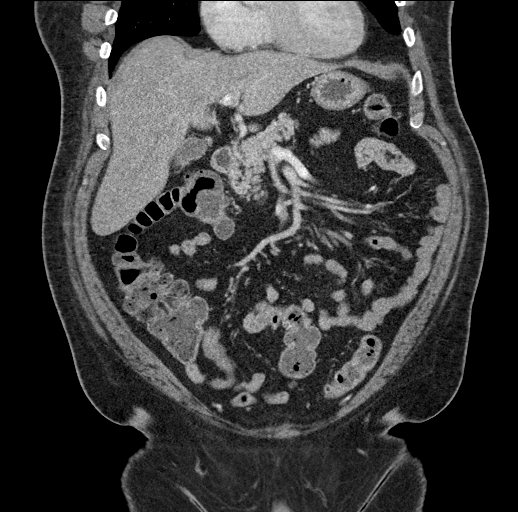
[im 55/123  soft-tissue]
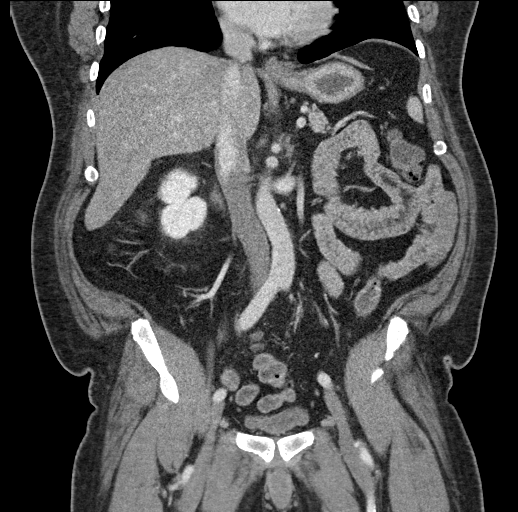
[im 68/123  soft-tissue]
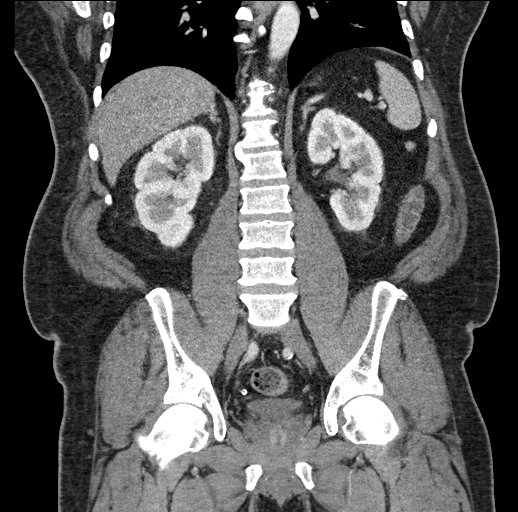

[16 of 46 positions shown; findings below may reference images not displayed]

FINDINGS: Lower chest: No acute abnormality.

Hepatobiliary: No focal liver abnormality is seen. No gallstones,
gallbladder wall thickening, or biliary dilatation.

Pancreas: Unremarkable. No pancreatic ductal dilatation or
surrounding inflammatory changes.

Spleen: Normal in size without focal abnormality.

Adrenals/Urinary Tract: Adrenal glands appear normal. Kidneys are
unremarkable without mass, stone or hydronephrosis. No ureteral or
bladder calculi identified. Bladder is decompressed.

Stomach/Bowel: No dilated large or small bowel loops. Questionable
mild thickening of the walls of the proximal transverse colon
(series 3, image 44; coronal series 6, images 34 through 46). No
evidence of bowel wall inflammation or mesenteric inflammation.
Surgical changes in the RIGHT lower quadrant suggesting
appendectomy. Stomach is unremarkable, partially decompressed.

Vascular/Lymphatic: No significant vascular findings are present. No
enlarged abdominal or pelvic lymph nodes.

Reproductive: Prostate is unremarkable.

Other: No free fluid or abscess collection. No free intraperitoneal
air.

Musculoskeletal: Mild degenerative spondylosis within the lumbar
spine. No acute or suspicious osseous finding.
IMPRESSION: 1. Fluid is present throughout the majority of the large bowel which
can be a secondary indication of underlying mild colitis or
enteritis. There is questionable thickening of the walls of the
proximal transverse colon which also raises the possibility of a
mild/focal colitis. Would consider follow-up with colonoscopy to
exclude the less likely possibility of early neoplastic wall
thickening.
2. No other acute or significant findings within the abdomen or
pelvis. No bowel obstruction. No evidence of acute solid organ
abnormality. No renal or ureteral calculi. No free fluid or abscess.
3. Mild degenerative spondylosis within the lumbar spine.

## 2018-11-30 MED ORDER — IOHEXOL 300 MG/ML  SOLN
100.0000 mL | Freq: Once | INTRAMUSCULAR | Status: AC | PRN
  Administered 2018-11-30: 100 mL via INTRAVENOUS

## 2018-11-30 MED ORDER — SODIUM CHLORIDE 0.9% FLUSH
3.0000 mL | Freq: Once | INTRAVENOUS | Status: AC
  Administered 2018-11-30: 3 mL via INTRAVENOUS

## 2018-11-30 MED ORDER — SODIUM CHLORIDE 0.9 % IV BOLUS
1000.0000 mL | Freq: Once | INTRAVENOUS | Status: AC
  Administered 2018-11-30: 1000 mL via INTRAVENOUS

## 2018-11-30 NOTE — ED Provider Notes (Signed)
[facility] EMERGENCY DEPARTMENT Provider Note   CSN: [id number] Arrival date & time: 11/30/18  1520     History   Chief Complaint Chief Complaint  Patient presents with  . Abdominal Pain  . Nausea    HPI [name] is a 48 y.o. male.  Patient is a 48 year old male with a history of diabetes, reflux, hypertension, hyperlipidemia, atrial fibrillation (on Xarelto) and prior alcohol and substance abuse who presents with abdominal pain.  He reports a one-week history of left-sided abdominal pain associated with nausea and decreased appetite.  He is also had some constipation but about 4 days ago he had a normal bowel movement and still had the ongoing pain.  He denies any urinary symptoms.  No blood in his stool.  No known fevers.  He has had a prior appendectomy but no other abdominal surgeries.  He also says that he hasn't picked up his Xarelto refill yet since he ran out one week ago.     Past Medical History:  Diagnosis Date  . Diabetes mellitus   . GERD (gastroesophageal reflux disease)   . History of alcohol abuse   . History of cocaine use   . History of echocardiogram    a. Echo 4/14: Moderate LVH, vigorous LVEF, EF 65-70%, normal wall motion, grade 2 diastolic dysfunction, mildly dilated aortic root and ascending aorta, ascending aorta 40 mm, aortic root 38 mm, mild LAE  . Hx of cardiovascular stress test    a. GXT 5/14: No ischemic changes  //  b. ETT-Myoview 3/16:  Low risk, no ischemia, EF 58%  . Hyperlipidemia   . Hypertension   . Morbid obesity (HCC)   . Obesity   . Paroxysmal atrial fibrillation (HCC)    Occurring in 2008, with several recurrence since then (including in the setting of + cocaine on UDS).  . Sleep apnea     Patient Active Problem List   Diagnosis Date Noted  . Class 3 obesity due to excess calories with serious comorbidity and body mass index (BMI) of 40.0 to 44.9 in adult 12/08/2016  . PAF (paroxysmal atrial  fibrillation) (HCC) 01/23/2013  . Diabetes mellitus (HCC) 01/23/2013  . Hypertensive heart disease 01/23/2013  . Tobacco user 01/23/2013  . Chronic diastolic heart failure (HCC) 01/21/2013  . 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 Shoulder pain, right 02/15/2011  . Seasonal and perennial allergic rhinitis 02/13/2008  . Dyslipidemia 02/12/2008  . OBESITY, MORBID 02/12/2008  . COCAINE Abuse, past hx 02/12/2008  . Obstructive sleep apnea 02/12/2008    Past Surgical History:  Procedure Laterality Date  . APPENDECTOMY    . ROTATOR CUFF REPAIR    . TONSILLECTOMY          Home Medications    Prior to Admission medications   Medication Sig Start Date End Date Taking? Authorizing Provider  amphetamine-dextroamphetamine (ADDERALL XR) 30 MG 24 hr capsule Take 1 capsule (30 mg total) by mouth daily as needed (For narcolepsy.). 04/15/18   Jetty Duhamel D, MD  atorvastatin (LIPITOR) 10 MG tablet Take 10 mg by mouth daily. 06/08/17   [provider]  flecainide (TAMBOCOR) 100 MG tablet Take 1 tablet (100 mg total) by mouth 2 (two) times daily. 01/08/18   Corky Crafts, MD  glipiZIDE (GLUCOTROL XL) 10 MG 24 hr tablet TAKE 1 TABLET (10 MG TOTAL) BY MOUTH DAILY. 02/05/17   Veryl Speak, FNP  lisinopril-hydrochlorothiazide (PRINZIDE,ZESTORETIC) 10-12.5 MG per tablet Take 1 tablet by mouth  at bedtime.     [provider]  metoprolol succinate (TOPROL-XL) 25 MG 24 hr tablet Take 1 tablet (25 mg total) by mouth daily. 01/08/18   Corky Crafts, MD  pravastatin (PRAVACHOL) 40 MG tablet Take 1 tablet (40 mg total) by mouth every evening. 04/10/16   Tereso Newcomer T, PA-C  rivaroxaban (XARELTO) 20 MG TABS tablet TAKE 1 TABLET (20 MG TOTAL) BY MOUTH DAILY WITH SUPPER. 01/08/18   Corky Crafts, MD  traZODone (DESYREL) 50 MG tablet Take 1 tablet (50 mg total) by mouth at bedtime. 03/09/17   Waymon Budge, MD    Family History Family History  Problem Relation Age of  Onset  . Diabetes Mother   . Heart attack Father 45  . Stroke Maternal Grandmother   . Stroke Paternal Grandmother   . Diabetes Paternal Grandfather     Social History Social History   Tobacco Use  . Smoking status: Current Every Day Smoker    Packs/day: 1.00    Years: 28.00    Pack years: 28.00    Last attempt to quit: 10/17/2007    Years since quitting: 11.1  . Smokeless tobacco: Former Neurosurgeon    Types: Snuff  . Tobacco comment: smokes one pack per day  Substance Use Topics  . Alcohol use: Yes    Comment: occasionally - 1 beer 1x/mont  . Drug use: No    Comment: cocaine in the past, none currently     Allergies   Shrimp [shellfish allergy]; Banana; Watermelon flavor; Other; Peanut-containing drug products; Almond oil; and Sulfa antibiotics   Review of Systems Review of Systems  Constitutional: Negative for chills, diaphoresis, fatigue and fever.  HENT: Negative for congestion, rhinorrhea and sneezing.   Eyes: Negative.   Respiratory: Negative for cough, chest tightness and shortness of breath.   Cardiovascular: Negative for chest pain and leg swelling.  Gastrointestinal: Positive for abdominal pain and nausea. Negative for blood in stool, diarrhea and vomiting.  Genitourinary: Negative for difficulty urinating, flank pain, frequency and hematuria.  Musculoskeletal: Negative for arthralgias and back pain.  Skin: Negative for rash.  Neurological: Negative for dizziness, speech difficulty, weakness, numbness and headaches.     Physical Exam Updated Vital Signs BP (!) 118/94 (BP Location: Right Arm)   Pulse 81   Temp (!) 97.3 F (36.3 C) (Oral)   Resp 16   SpO2 97%   Physical Exam Constitutional:      Appearance: He is well-developed.  HENT:     Head: Normocephalic and atraumatic.  Eyes:     Pupils: Pupils are equal, round, and reactive to light.  Neck:     Musculoskeletal: Normal range of motion and neck supple.  Cardiovascular:     Rate and Rhythm:  Normal rate. Rhythm irregular.     Heart sounds: Normal heart sounds.  Pulmonary:     Effort: Pulmonary effort is normal. No respiratory distress.     Breath sounds: Normal breath sounds. No wheezing or rales.  Chest:     Chest wall: No tenderness.  Abdominal:     General: Bowel sounds are normal.     Palpations: Abdomen is soft.     Tenderness: There is abdominal tenderness in the left upper quadrant and left lower quadrant. There is no guarding or rebound.  Musculoskeletal: Normal range of motion.  Lymphadenopathy:     Cervical: No cervical adenopathy.  Skin:    General: Skin is warm and dry.     Findings:  No rash.  Neurological:     Mental Status: He is alert and oriented to person, place, and time.      ED Treatments / Results  Labs (all labs ordered are listed, but only abnormal results are displayed) Labs Reviewed  COMPREHENSIVE METABOLIC PANEL - Abnormal; Notable for the following components:      Result Value   Potassium 5.6 (*)    Glucose, Bld 224 (*)    BUN 25 (*)    Creatinine, Ser 1.71 (*)    GFR calc non Af Amer 46 (*)    GFR calc Af Amer 54 (*)    All other components within normal limits  CBC - Abnormal; Notable for the following components:   WBC 10.6 (*)    All other components within normal limits  URINALYSIS, ROUTINE W REFLEX MICROSCOPIC - Abnormal; Notable for the following components:   APPearance HAZY (*)    Glucose, UA >=500 (*)    Hgb urine dipstick SMALL (*)    Protein, ur 100 (*)    All other components within normal limits  LIPASE, BLOOD  BASIC METABOLIC PANEL    EKG EKG Interpretation  Date/Time:  Saturday November 30 2018 16:43:28 EST Ventricular Rate:  103 PR Interval:    QRS Duration: 108 QT Interval:  380 QTC Calculation: 497 R Axis:   -30 Text Interpretation:  Atrial flutter with variable A-V block Left axis deviation Septal infarct , age undetermined T wave abnormality, consider lateral ischemia Abnormal ECG since last  tracing no significant change Confirmed by Rolan BuccoBelfi, Kentrell Guettler [phone]([id number]) on 11/30/2018 4:47:22 PM   Radiology Ct Abdomen Pelvis W Contrast  Result Date: 11/30/2018 CLINICAL DATA:  LEFT lower quadrant abdominal pain and nausea for 1 week. Diverticulitis suspected. EXAM: CT ABDOMEN AND PELVIS WITH CONTRAST TECHNIQUE: Multidetector CT imaging of the abdomen and pelvis was performed using the standard protocol following bolus administration of intravenous contrast. CONTRAST:  100mL OMNIPAQUE IOHEXOL 300 MG/ML  SOLN COMPARISON:  CT pelvis dated 04/11/2010. FINDINGS: Lower chest: No acute abnormality. Hepatobiliary: No focal liver abnormality is seen. No gallstones, gallbladder wall thickening, or biliary dilatation. Pancreas: Unremarkable. No pancreatic ductal dilatation or surrounding inflammatory changes. Spleen: Normal in size without focal abnormality. Adrenals/Urinary Tract: Adrenal glands appear normal. Kidneys are unremarkable without mass, stone or hydronephrosis. No ureteral or bladder calculi identified. Bladder is decompressed. Stomach/Bowel: No dilated large or small bowel loops. Questionable mild thickening of the walls of the proximal transverse colon (series 3, image 44; coronal series 6, images 34 through 46). No evidence of bowel wall inflammation or mesenteric inflammation. Surgical changes in the RIGHT lower quadrant suggesting appendectomy. Stomach is unremarkable, partially decompressed. Vascular/Lymphatic: No significant vascular findings are present. No enlarged abdominal or pelvic lymph nodes. Reproductive: Prostate is unremarkable. Other: No free fluid or abscess collection. No free intraperitoneal air. Musculoskeletal: Mild degenerative spondylosis within the lumbar spine. No acute or suspicious osseous finding. IMPRESSION: 1. Fluid is present throughout the majority of the large bowel which can be a secondary indication of underlying mild colitis or enteritis. There is questionable thickening of  the walls of the proximal transverse colon which also raises the possibility of a mild/focal colitis. Would consider follow-up with colonoscopy to exclude the less likely possibility of early neoplastic wall thickening. 2. No other acute or significant findings within the abdomen or pelvis. No bowel obstruction. No evidence of acute solid organ abnormality. No renal or ureteral calculi. No free fluid or abscess. 3. Mild degenerative spondylosis within  the lumbar spine. Electronically Signed   By: Bary Richard [name]   On: 11/30/2018 18:55    Procedures Procedures (including critical care time)  Medications Ordered in ED Medications  sodium chloride 0.9 % bolus 1,000 mL (1,000 mLs Intravenous New Bag/Given 11/30/18 1831)  sodium chloride flush (NS) 0.9 % injection 3 mL (3 mLs Intravenous Given 11/30/18 1702)  iohexol (OMNIPAQUE) 300 MG/ML solution 100 mL (100 mLs Intravenous Contrast Given 11/30/18 1807)     Initial Impression / Assessment and Plan / ED Course  I have reviewed the triage vital signs and the nursing notes.  Pertinent labs & imaging results that were available during my care of the patient were reviewed by me and considered in my medical decision making (see chart for details).     Patient is a 48 year old male who presents with some vague abdominal pain.  He has had some nausea and decreased appetite.  CT scan shows suggestions of enteritis.  There is a small area of wall thickening.  I do not feel this point he needs antibiotics.  He is otherwise well-appearing.  His abdominal exam is benign.  His labs show an elevated creatinine and mildly elevated potassium as well.  I will hydrate him and recheck this.  I have advised him that he will need follow-up with his PCP to recheck these labs as well as get referred for a colonoscopy to better evaluate the wall thickening.  Pt turned over to Courtni Coutre pending repeat labs.  Final Clinical Impressions(s) / ED Diagnoses   Final  diagnoses:  Enteritis  Elevated serum creatinine  Hyperkalemia    ED Discharge Orders    None       Rolan Bucco, MD 11/30/18 Windell Moment

## 2018-11-30 NOTE — ED Notes (Signed)
Patient verbalizes understanding of discharge instructions. Opportunity for questioning and answers were provided. Armband removed by staff, pt discharged from ED.  

## 2018-11-30 NOTE — ED Triage Notes (Signed)
Pt reports 1 week of left sided abd pain and nausea. No vomiting. No BM since Tuesday.

## 2018-11-30 NOTE — Discharge Instructions (Signed)
You have inflammation in your colon consistent with a most likely viral type infection.  There is some slight wall thickening and it is prudent to follow-up with your primary care physician to arrange a colonoscopy to further evaluate this.  Your creatinine (kidney function) is also mildly elevated and will need follow-up by your primary care physician.  Return to the emergency room if you have any worsening symptoms.

## 2018-11-30 NOTE — ED Provider Notes (Signed)
Care assumed from Dr. Fredderick Phenix at shift change with plan to follow-up on repeat BMP and if patient's potassium has normalized then anticipate discharge.  Per her note,  "Patient is a 48 year old male with a history of diabetes, reflux, hypertension, hyperlipidemia, atrial fibrillation (on Xarelto) and prior alcohol and substance abuse who presents with abdominal pain.  He reports a one-week history of left-sided abdominal pain associated with nausea and decreased appetite.  He is also had some constipation but about 4 days ago he had a normal bowel movement and still had the ongoing pain.  He denies any urinary symptoms.  No blood in his stool.  No known fevers.  He has had a prior appendectomy but no other abdominal surgeries.  He also says that he hasn't picked up his Xarelto refill yet since he ran out one week ago."   Physical Exam  BP (!) 118/94 (BP Location: Right Arm)   Pulse 81   Temp (!) 97.3 F (36.3 C) (Oral)   Resp 16   SpO2 97%   Physical Exam Constitutional:      General: He is not in acute distress.    Appearance: He is well-developed.  Eyes:     Conjunctiva/sclera: Conjunctivae normal.  Cardiovascular:     Rate and Rhythm: Normal rate.  Pulmonary:     Effort: Pulmonary effort is normal.  Skin:    General: Skin is warm and dry.  Neurological:     Mental Status: He is alert and oriented to person, place, and time.     ED Course/Procedures     Procedures  Results for orders placed or performed during the hospital encounter of 11/30/18  Lipase, blood  Result Value Ref Range   Lipase 27 11 - 51 U/L  Comprehensive metabolic panel  Result Value Ref Range   Sodium 135 135 - 145 mmol/L   Potassium 5.6 (H) 3.5 - 5.1 mmol/L   Chloride 98 98 - 111 mmol/L   CO2 29 22 - 32 mmol/L   Glucose, Bld 224 (H) 70 - 99 mg/dL   BUN 25 (H) 6 - 20 mg/dL   Creatinine, Ser 5.62 (H) 0.61 - 1.24 mg/dL   Calcium 9.3 8.9 - 13.0 mg/dL   Total Protein 7.5 6.5 - 8.1 g/dL   Albumin 3.6 3.5  - 5.0 g/dL   AST 19 15 - 41 U/L   ALT 23 0 - 44 U/L   Alkaline Phosphatase 62 38 - 126 U/L   Total Bilirubin 0.5 0.3 - 1.2 mg/dL   GFR calc non Af Amer 46 (L) >60 mL/min   GFR calc Af Amer 54 (L) >60 mL/min   Anion gap 8 5 - 15  CBC  Result Value Ref Range   WBC 10.6 (H) 4.0 - 10.5 K/uL   RBC 5.75 4.22 - 5.81 MIL/uL   Hemoglobin 15.6 13.0 - 17.0 g/dL   HCT 86.5 78.4 - 69.6 %   MCV 83.8 80.0 - 100.0 fL   MCH 27.1 26.0 - 34.0 pg   MCHC 32.4 30.0 - 36.0 g/dL   RDW 29.5 28.4 - 13.2 %   Platelets 380 150 - 400 K/uL   nRBC 0.0 0.0 - 0.2 %  Urinalysis, Routine w reflex microscopic  Result Value Ref Range   Color, Urine YELLOW YELLOW   APPearance HAZY (A) CLEAR   Specific Gravity, Urine 1.020 1.005 - 1.030   pH 5.0 5.0 - 8.0   Glucose, UA >=500 (A) NEGATIVE mg/dL   Hgb  urine dipstick SMALL (A) NEGATIVE   Bilirubin Urine NEGATIVE NEGATIVE   Ketones, ur NEGATIVE NEGATIVE mg/dL   Protein, ur 161100 (A) NEGATIVE mg/dL   Nitrite NEGATIVE NEGATIVE   Leukocytes,Ua NEGATIVE NEGATIVE   RBC / HPF 6-10 0 - 5 RBC/hpf   WBC, UA 0-5 0 - 5 WBC/hpf   Bacteria, UA NONE SEEN NONE SEEN   Squamous Epithelial / LPF 0-5 0 - 5   Mucus PRESENT    Hyaline Casts, UA PRESENT   Basic metabolic panel  Result Value Ref Range   Sodium 133 (L) 135 - 145 mmol/L   Potassium 4.6 3.5 - 5.1 mmol/L   Chloride 99 98 - 111 mmol/L   CO2 23 22 - 32 mmol/L   Glucose, Bld 152 (H) 70 - 99 mg/dL   BUN 24 (H) 6 - 20 mg/dL   Creatinine, Ser 0.961.36 (H) 0.61 - 1.24 mg/dL   Calcium 8.7 (L) 8.9 - 10.3 mg/dL   GFR calc non Af Amer >60 >60 mL/min   GFR calc Af Amer >60 >60 mL/min   Anion gap 11 5 - 15   Ct Abdomen Pelvis W Contrast  Result Date: 11/30/2018 CLINICAL DATA:  LEFT lower quadrant abdominal pain and nausea for 1 week. Diverticulitis suspected. EXAM: CT ABDOMEN AND PELVIS WITH CONTRAST TECHNIQUE: Multidetector CT imaging of the abdomen and pelvis was performed using the standard protocol following bolus administration  of intravenous contrast. CONTRAST:  100mL OMNIPAQUE IOHEXOL 300 MG/ML  SOLN COMPARISON:  CT pelvis dated 04/11/2010. FINDINGS: Lower chest: No acute abnormality. Hepatobiliary: No focal liver abnormality is seen. No gallstones, gallbladder wall thickening, or biliary dilatation. Pancreas: Unremarkable. No pancreatic ductal dilatation or surrounding inflammatory changes. Spleen: Normal in size without focal abnormality. Adrenals/Urinary Tract: Adrenal glands appear normal. Kidneys are unremarkable without mass, stone or hydronephrosis. No ureteral or bladder calculi identified. Bladder is decompressed. Stomach/Bowel: No dilated large or small bowel loops. Questionable mild thickening of the walls of the proximal transverse colon (series 3, image 44; coronal series 6, images 34 through 46). No evidence of bowel wall inflammation or mesenteric inflammation. Surgical changes in the RIGHT lower quadrant suggesting appendectomy. Stomach is unremarkable, partially decompressed. Vascular/Lymphatic: No significant vascular findings are present. No enlarged abdominal or pelvic lymph nodes. Reproductive: Prostate is unremarkable. Other: No free fluid or abscess collection. No free intraperitoneal air. Musculoskeletal: Mild degenerative spondylosis within the lumbar spine. No acute or suspicious osseous finding. IMPRESSION: 1. Fluid is present throughout the majority of the large bowel which can be a secondary indication of underlying mild colitis or enteritis. There is questionable thickening of the walls of the proximal transverse colon which also raises the possibility of a mild/focal colitis. Would consider follow-up with colonoscopy to exclude the less likely possibility of early neoplastic wall thickening. 2. No other acute or significant findings within the abdomen or pelvis. No bowel obstruction. No evidence of acute solid organ abnormality. No renal or ureteral calculi. No free fluid or abscess. 3. Mild degenerative  spondylosis within the lumbar spine. Electronically Signed   By: Bary RichardStan  Maynard [name]   On: 11/30/2018 18:55     MDM   Reviewed labs.  Repeat BMP with normal potassium of 4.6.  His creatinine is also improved somewhat but remains elevated.  He was informed of results.  Patient reassessed.  He is in no distress.  He has tolerated graham crackers.  He states he is feeling somewhat better.  Discussed the plan for follow-up with his PCP.  Advised him to return the ER for new or worsening symptoms in the meantime.  He voiced understanding the plan and reasons to return.  All questions answered.  Patient stable for discharge.       Rayne Du 11/30/18 2118    Rolan Bucco, MD 12/01/18 1112

## 2018-11-30 NOTE — ED Notes (Signed)
Pt returned from CT °

## 2018-11-30 NOTE — ED Notes (Signed)
Patient transported to CT 

## 2018-12-03 DIAGNOSIS — E1169 Type 2 diabetes mellitus with other specified complication: Secondary | ICD-10-CM | POA: Diagnosis not present

## 2018-12-03 DIAGNOSIS — Z23 Encounter for immunization: Secondary | ICD-10-CM | POA: Diagnosis not present

## 2018-12-03 DIAGNOSIS — E7800 Pure hypercholesterolemia, unspecified: Secondary | ICD-10-CM | POA: Diagnosis not present

## 2018-12-03 DIAGNOSIS — K5900 Constipation, unspecified: Secondary | ICD-10-CM | POA: Diagnosis not present

## 2018-12-03 DIAGNOSIS — R1084 Generalized abdominal pain: Secondary | ICD-10-CM | POA: Diagnosis not present

## 2018-12-13 ENCOUNTER — Ambulatory Visit: Payer: 59 | Admitting: Internal Medicine

## 2018-12-13 DIAGNOSIS — R1084 Generalized abdominal pain: Secondary | ICD-10-CM | POA: Diagnosis not present

## 2018-12-13 DIAGNOSIS — R197 Diarrhea, unspecified: Secondary | ICD-10-CM | POA: Diagnosis not present

## 2018-12-13 DIAGNOSIS — R933 Abnormal findings on diagnostic imaging of other parts of digestive tract: Secondary | ICD-10-CM | POA: Diagnosis not present

## 2018-12-23 ENCOUNTER — Telehealth: Payer: Self-pay

## 2018-12-23 ENCOUNTER — Ambulatory Visit: Payer: 59 | Admitting: Internal Medicine

## 2018-12-23 NOTE — Telephone Encounter (Signed)
   Primary Cardiologist:Jayadeep Eldridge Dace, MD  Chart reviewed as part of pre-operative protocol coverage. Because of [name]'s past medical history and time since last visit, he/she will require a follow-up visit in order to better assess preoperative cardiovascular risk.  Pre-op covering staff: - Please schedule appointment and call patient to inform them. - Please contact requesting surgeon's office via preferred method (i.e, phone, fax) to inform them of need for appointment prior to surgery.  If applicable, this message will also be routed to pharmacy pool and/or primary cardiologist for input on holding anticoagulant/antiplatelet agent as requested below so that this information is available at time of patient's appointment.   Nada Boozer, NP  12/23/2018, 3:27 PM

## 2018-12-23 NOTE — Telephone Encounter (Signed)
   [facility] Pre-operative Risk Assessment    Request for surgical clearance:  1. What type of surgery is being performed? Colonoscopy    2. When is this surgery scheduled? 01/09/19    3. What type of clearance is required (medical clearance vs. Pharmacy clearance to hold med vs. Both)? Both  4. Are there any medications that need to be held prior to surgery and how long? Xarelto to be had before the procedure   5. Practice name and name of physician performing surgery? [facility], Dr. Oletta Lamas   6. What is your office phone number: [phone]    7.   What is your office fax number [phone]  8.   Anesthesia type (None, local, MAC, general) ? None listed   Jacinta Shoe 12/23/2018, 3:05 PM  _________________________________________________________________   (provider comments below)

## 2018-12-24 NOTE — Telephone Encounter (Signed)
Spoke with pt and scheduled an appt for him with Boyce Medici, PA on 3/25 at 11 am. Pt verbalized understanding and thanked me for the call. Will fax over clearance to requesting surgeon's office.

## 2018-12-25 ENCOUNTER — Ambulatory Visit: Payer: 59 | Admitting: Physician Assistant

## 2018-12-27 ENCOUNTER — Emergency Department (HOSPITAL_COMMUNITY)
Admission: EM | Admit: 2018-12-27 | Discharge: 2018-12-27 | Disposition: A | Discharge status: Home / Self Care | Payer: 59 | Attending: Emergency Medicine | Admitting: Emergency Medicine

## 2018-12-27 ENCOUNTER — Emergency Department (HOSPITAL_COMMUNITY): Payer: 59

## 2018-12-27 ENCOUNTER — Encounter: Payer: Self-pay | Admitting: Cardiology

## 2018-12-27 ENCOUNTER — Other Ambulatory Visit: Payer: Self-pay

## 2018-12-27 DIAGNOSIS — W1789XA Other fall from one level to another, initial encounter: Secondary | ICD-10-CM | POA: Insufficient documentation

## 2018-12-27 DIAGNOSIS — F17200 Nicotine dependence, unspecified, uncomplicated: Secondary | ICD-10-CM | POA: Insufficient documentation

## 2018-12-27 DIAGNOSIS — Z79899 Other long term (current) drug therapy: Secondary | ICD-10-CM | POA: Insufficient documentation

## 2018-12-27 DIAGNOSIS — Z7901 Long term (current) use of anticoagulants: Secondary | ICD-10-CM | POA: Insufficient documentation

## 2018-12-27 DIAGNOSIS — W19XXXA Unspecified fall, initial encounter: Secondary | ICD-10-CM

## 2018-12-27 DIAGNOSIS — Y998 Other external cause status: Secondary | ICD-10-CM | POA: Diagnosis not present

## 2018-12-27 DIAGNOSIS — I480 Paroxysmal atrial fibrillation: Secondary | ICD-10-CM | POA: Diagnosis not present

## 2018-12-27 DIAGNOSIS — E785 Hyperlipidemia, unspecified: Secondary | ICD-10-CM | POA: Insufficient documentation

## 2018-12-27 DIAGNOSIS — Z7984 Long term (current) use of oral hypoglycemic drugs: Secondary | ICD-10-CM | POA: Insufficient documentation

## 2018-12-27 DIAGNOSIS — Y9289 Other specified places as the place of occurrence of the external cause: Secondary | ICD-10-CM | POA: Insufficient documentation

## 2018-12-27 DIAGNOSIS — Y9389 Activity, other specified: Secondary | ICD-10-CM | POA: Insufficient documentation

## 2018-12-27 DIAGNOSIS — E119 Type 2 diabetes mellitus without complications: Secondary | ICD-10-CM | POA: Diagnosis not present

## 2018-12-27 DIAGNOSIS — I10 Essential (primary) hypertension: Secondary | ICD-10-CM | POA: Diagnosis not present

## 2018-12-27 DIAGNOSIS — Z91010 Allergy to peanuts: Secondary | ICD-10-CM | POA: Insufficient documentation

## 2018-12-27 DIAGNOSIS — S300XXA Contusion of lower back and pelvis, initial encounter: Secondary | ICD-10-CM | POA: Diagnosis not present

## 2018-12-27 DIAGNOSIS — S34109A Unspecified injury to unspecified level of lumbar spinal cord, initial encounter: Secondary | ICD-10-CM | POA: Diagnosis not present

## 2018-12-27 DIAGNOSIS — S3992XA Unspecified injury of lower back, initial encounter: Secondary | ICD-10-CM | POA: Diagnosis present

## 2018-12-27 IMAGING — DX LUMBAR SPINE - COMPLETE 4+ VIEW
5 series · 5 of 5 positions shown · non-contrast
Comparison: None.

CLINICAL DATA: Fall

EXAM:
LUMBAR SPINE - COMPLETE 4+ VIEW

[l-spine ap]
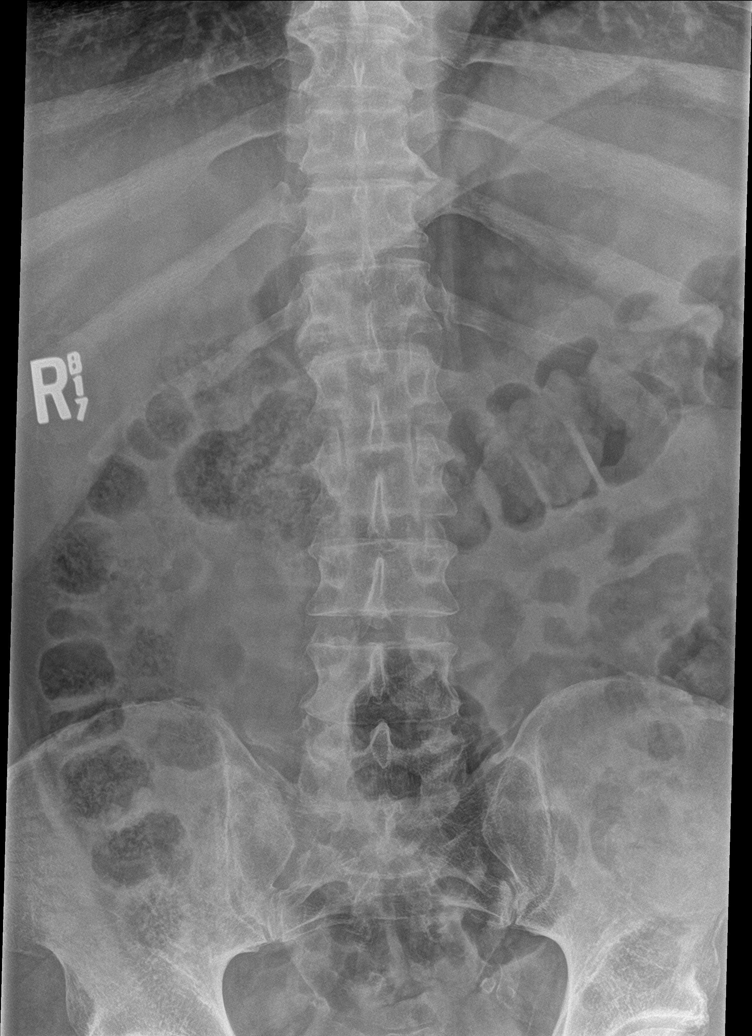

[l-spine obl (1 of 2)]
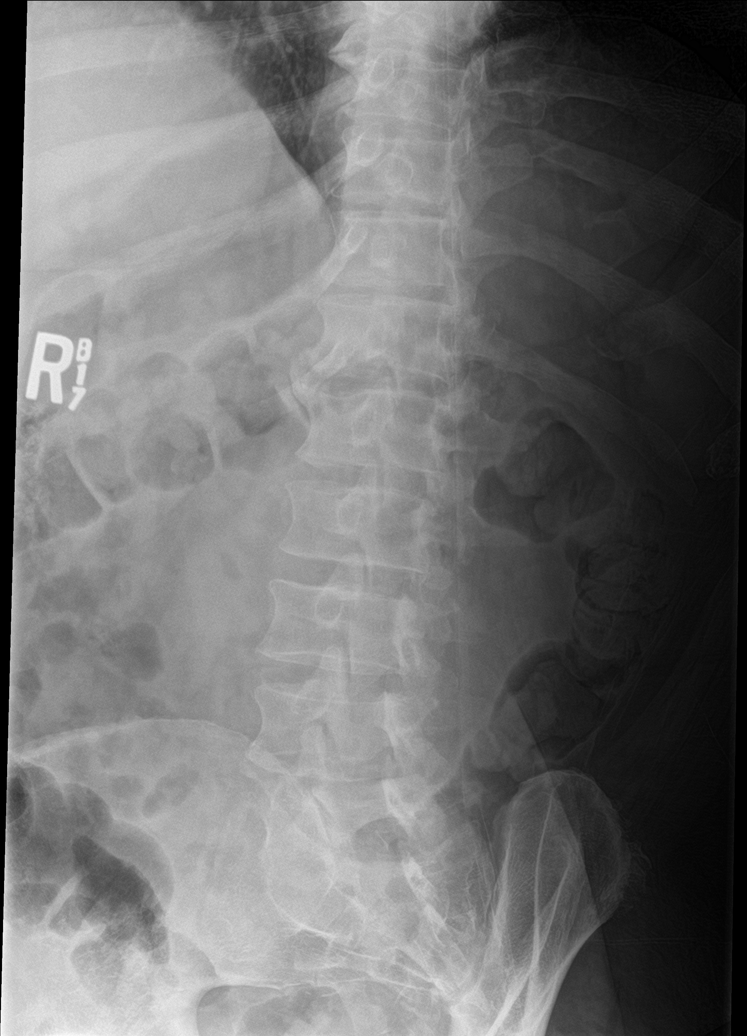

[l-spine obl (2 of 2)]
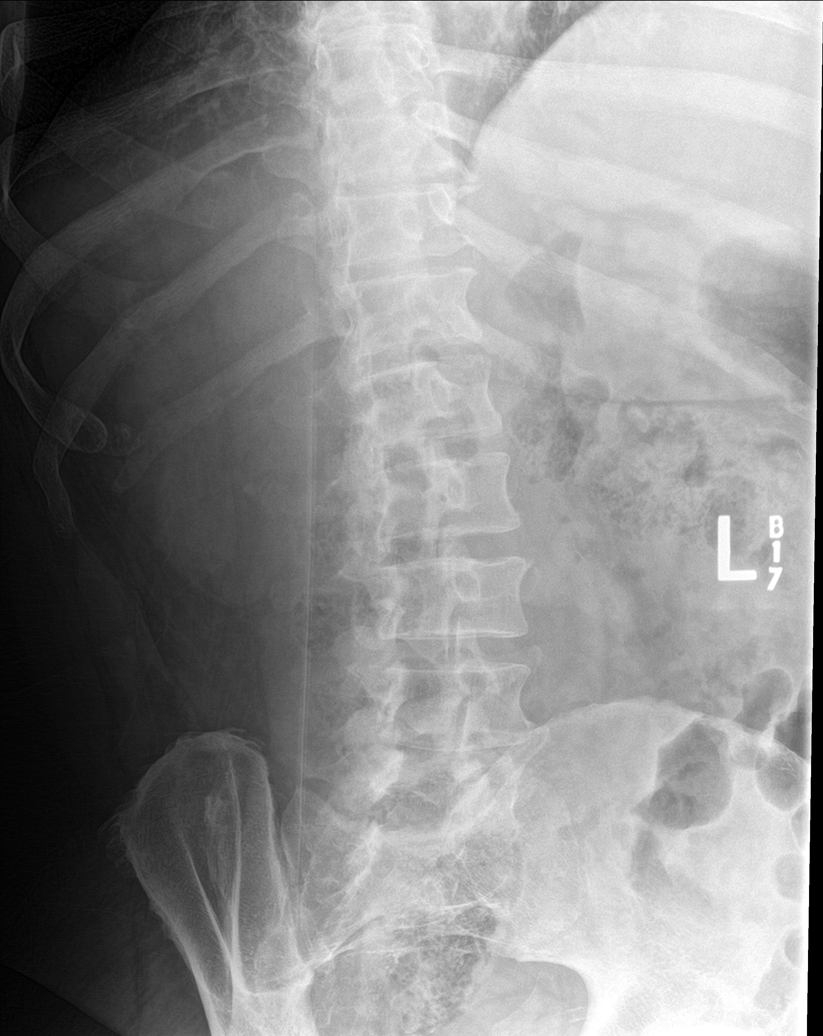

[l-spine lat]
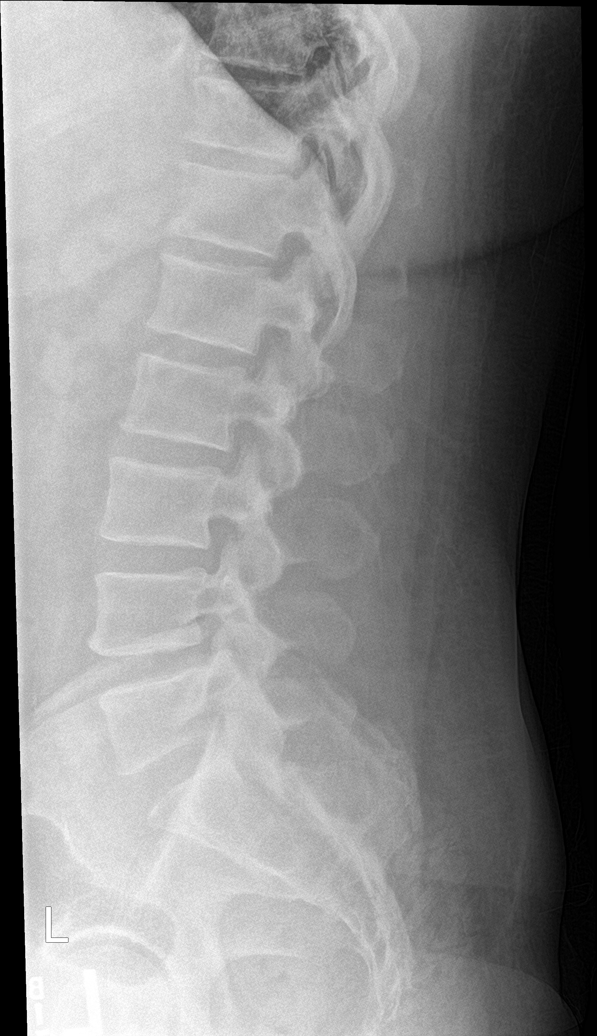

[l-spine spot]
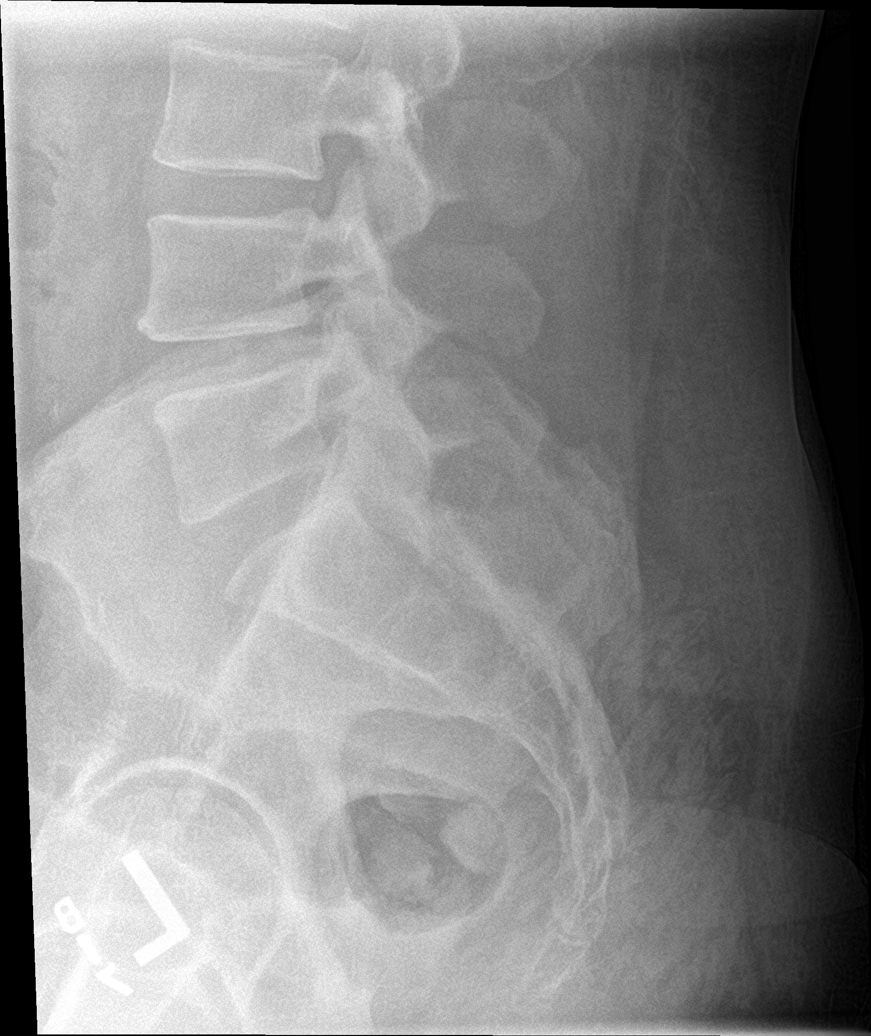

[5 of 5 positions shown; findings below may reference images not displayed]

FINDINGS: There is no evidence of lumbar spine fracture. Alignment is normal.
Intervertebral disc spaces are maintained.
IMPRESSION: Negative.

## 2018-12-27 MED ORDER — CYCLOBENZAPRINE HCL 10 MG PO TABS
10.0000 mg | ORAL_TABLET | Freq: Once | ORAL | Status: AC
  Administered 2018-12-27: 10 mg via ORAL
  Filled 2018-12-27: qty 1

## 2018-12-27 MED ORDER — OXYCODONE-ACETAMINOPHEN 5-325 MG PO TABS
1.0000 | ORAL_TABLET | Freq: Once | ORAL | Status: AC
  Administered 2018-12-27: 1 via ORAL
  Filled 2018-12-27: qty 1

## 2018-12-27 MED ORDER — CYCLOBENZAPRINE HCL 10 MG PO TABS: 10.0000 mg | ORAL_TABLET | Freq: Two times a day (BID) | ORAL | 0 refills | Status: DC | PRN

## 2018-12-27 NOTE — ED Notes (Signed)
Discharge instructions and prescription discussed with Pt. Pt verbalized understanding. Pt stable and ambulatory.    

## 2018-12-27 NOTE — ED Provider Notes (Signed)
[facility], [place] EMERGENCY DEPARTMENT Provider Note   CSN: [id number] Arrival date & time: 12/27/18  1847    History   Chief Complaint Chief Complaint  Patient presents with  . Fall  . Back Pain    HPI [name] is a 47 y.o. male with multiple medical problems as listed in PMH who presents to the ED s/p fall with c/o back pain. The fall occurred 2 days ago. Patient reports he was starting to sit on a 4 foot wall and misjudged and went over the back of it and landed on his back on a brick patio. Patient reports he has taken nothing for pain. He rates the pain as 10/10. Patient denies head injury or LOC. Patient reports the pain starts in the lower back and goes down the right leg.      HPI  Past Medical History:  Diagnosis Date  . Diabetes mellitus   . GERD (gastroesophageal reflux disease)   . History of alcohol abuse   . History of cocaine use   . History of echocardiogram    a. Echo 4/14: Moderate LVH, vigorous LVEF, EF 65-70%, normal wall motion, grade 2 diastolic dysfunction, mildly dilated aortic root and ascending aorta, ascending aorta 40 mm, aortic root 38 mm, mild LAE  . Hx of cardiovascular stress test    a. GXT 5/14: No ischemic changes  //  b. ETT-Myoview 3/16:  Low risk, no ischemia, EF 58%  . Hyperlipidemia   . Hypertension   . Morbid obesity (HCC)   . Obesity   . Paroxysmal atrial fibrillation (HCC)    Occurring in 2008, with several recurrence since then (including in the setting of + cocaine on UDS).  . Sleep apnea     Patient Active Problem List   Diagnosis Date Noted  . Class 3 obesity due to excess calories with serious comorbidity and body mass index (BMI) of 40.0 to 44.9 in adult 12/08/2016  . PAF (paroxysmal atrial fibrillation) (HCC) 01/23/2013  . Diabetes mellitus (HCC) 01/23/2013  . Hypertensive heart disease 01/23/2013  . Tobacco user 01/23/2013  . Chronic diastolic heart failure (HCC) 01/21/2013  . 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 Shoulder pain, right 02/15/2011  . Seasonal and perennial allergic rhinitis 02/13/2008  . Dyslipidemia 02/12/2008  . OBESITY, MORBID 02/12/2008  . COCAINE Abuse, past hx 02/12/2008  . Obstructive sleep apnea 02/12/2008    Past Surgical History:  Procedure Laterality Date  . APPENDECTOMY    . ROTATOR CUFF REPAIR    . TONSILLECTOMY          Home Medications    Prior to Admission medications   Medication Sig Start Date End Date Taking? Authorizing Provider  amphetamine-dextroamphetamine (ADDERALL XR) 30 MG 24 hr capsule Take 1 capsule (30 mg total) by mouth daily as needed (For narcolepsy.). 04/15/18   Jetty Duhamel D, MD  atorvastatin (LIPITOR) 10 MG tablet Take 10 mg by mouth daily. 06/08/17   [provider]  cyclobenzaprine (FLEXERIL) 10 MG tablet Take 1 tablet (10 mg total) by mouth 2 (two) times daily as needed for muscle spasms. 12/27/18   Janne Napoleon, NP  flecainide (TAMBOCOR) 100 MG tablet Take 1 tablet (100 mg total) by mouth 2 (two) times daily. 01/08/18   Corky Crafts, MD  glipiZIDE (GLUCOTROL XL) 10 MG 24 hr tablet TAKE 1 TABLET (10 MG TOTAL) BY MOUTH DAILY. 02/05/17   Veryl Speak, FNP  lisinopril-hydrochlorothiazide (PRINZIDE,ZESTORETIC) 10-12.5 MG per tablet  Take 1 tablet by mouth at bedtime.     [provider]  metoprolol succinate (TOPROL-XL) 25 MG 24 hr tablet Take 1 tablet (25 mg total) by mouth daily. 01/08/18   Corky Crafts, MD  pravastatin (PRAVACHOL) 40 MG tablet Take 1 tablet (40 mg total) by mouth every evening. 04/10/16   Tereso Newcomer T, PA-C  rivaroxaban (XARELTO) 20 MG TABS tablet TAKE 1 TABLET (20 MG TOTAL) BY MOUTH DAILY WITH SUPPER. 01/08/18   Corky Crafts, MD  traZODone (DESYREL) 50 MG tablet Take 1 tablet (50 mg total) by mouth at bedtime. 03/09/17   Waymon Budge, MD    Family History Family History  Problem Relation Age of Onset  . Diabetes Mother   . Heart attack Father 34  .  Stroke Maternal Grandmother   . Stroke Paternal Grandmother   . Diabetes Paternal Grandfather     Social History Social History   Tobacco Use  . Smoking status: Current Every Day Smoker    Packs/day: 1.00    Years: 28.00    Pack years: 28.00    Last attempt to quit: 10/17/2007    Years since quitting: 11.2  . Smokeless tobacco: Former Neurosurgeon    Types: Snuff  . Tobacco comment: smokes one pack per day  Substance Use Topics  . Alcohol use: Yes    Comment: occasionally - 1 beer 1x/mont  . Drug use: No    Comment: cocaine in the past, none currently     Allergies   Shrimp [shellfish allergy]; Banana; Watermelon flavor; Other; Peanut-containing drug products; Almond oil; and Sulfa antibiotics   Review of Systems Review of Systems  Musculoskeletal: Positive for arthralgias and back pain.  All other systems reviewed and are negative.    Physical Exam Updated Vital Signs BP (!) 163/96 (BP Location: Right Arm) Comment: Hasn't taken BP meds in 2 days  Pulse 85   Temp 97.6 F (36.4 C) (Oral)   Resp 18   Ht 5\' 9"  (1.753 m)   Wt 127 kg   SpO2 99%   BMI 41.35 kg/m   Physical Exam Vitals signs and nursing note reviewed.  Constitutional:      General: He is not in acute distress.    Appearance: He is well-developed.     Comments: Morbidly obese  HENT:     Head: Normocephalic and atraumatic.     Mouth/Throat:     Mouth: Mucous membranes are moist.  Eyes:     Extraocular Movements: Extraocular movements intact.     Conjunctiva/sclera: Conjunctivae normal.  Neck:     Musculoskeletal: Normal range of motion and neck supple.  Cardiovascular:     Rate and Rhythm: Normal rate and regular rhythm.  Pulmonary:     Effort: Pulmonary effort is normal.     Breath sounds: Normal breath sounds.  Abdominal:     General: Bowel sounds are normal.     Palpations: Abdomen is soft.     Tenderness: There is no abdominal tenderness.  Musculoskeletal:     Lumbar back: He exhibits  tenderness and spasm. He exhibits no deformity, no laceration and normal pulse. Decreased range of motion: due to pain.     Comments: Pedal pulses 2+. Strength is equal.   Skin:    General: Skin is warm and dry.  Neurological:     Mental Status: He is alert and oriented to person, place, and time.     Sensory: Sensation is intact.  Motor: Motor function is intact.     Gait: Gait normal.     Comments: Patient able to do straight leg raises.  Psychiatric:        Mood and Affect: Mood normal.      ED Treatments / Results  Labs (all labs ordered are listed, but only abnormal results are displayed) Labs Reviewed - No data to display Radiology Dg Lumbar Spine Complete  Result Date: 12/27/2018 CLINICAL DATA:  Fall EXAM: LUMBAR SPINE - COMPLETE 4+ VIEW COMPARISON:  None. FINDINGS: There is no evidence of lumbar spine fracture. Alignment is normal. Intervertebral disc spaces are maintained. IMPRESSION: Negative. Electronically Signed   By: Deatra Robinson [name]   On: 12/27/2018 21:35    Procedures Procedures (including critical care time)  Medications Ordered in ED Medications  cyclobenzaprine (FLEXERIL) tablet 10 mg (10 mg Oral Given 12/27/18 2107)  oxyCODONE-acetaminophen (PERCOCET/ROXICET) 5-325 MG per tablet 1 tablet (1 tablet Oral Given 12/27/18 2107)   After treatment in the ED patient reports the pain is almost gone. He is able to ambulate without difficulty. I am able to press on his lower back without patient having pain.   Initial Impression / Assessment and Plan / ED Course  I have reviewed the triage vital signs and the nursing notes. 48 y.o. male here with low back pain and spasm s/p fall 2 days ago stable for d/c without neuro deficits and improvement with treatment in the ED. Discussed with the patient plan of care and he voices understanding and agrees with plan. He will f/u with PCP or return here for worsening symptoms   Final Clinical Impressions(s) / ED Diagnoses    Final diagnoses:  Fall, initial encounter  Contusion of lower back, initial encounter    ED Discharge Orders         Ordered    cyclobenzaprine (FLEXERIL) 10 MG tablet  2 times daily PRN     12/27/18 2211           Kerrie [place], NP 12/27/18 2221    Alvira Monday, MD 12/29/18 1106

## 2018-12-27 NOTE — Discharge Instructions (Addendum)
I have sent in a prescription for a muscle relaxer to your pharmacy. You may take tylenol in addition to the muscle relaxer. Follow up with your doctor in a few days for recheck. Return here as needed for worsening symptoms.

## 2018-12-27 NOTE — ED Triage Notes (Signed)
Pt was trying to sit on a brick wall and "misjudged" the distance and fell about 4 ft onto his back, denies hitting head.  Reports right lower back pain that extends down leg into foot.

## 2018-12-27 NOTE — ED Notes (Signed)
Patient transported to X-ray 

## 2019-01-02 ENCOUNTER — Telehealth: Payer: Self-pay

## 2019-01-02 NOTE — Telephone Encounter (Signed)
I spoke to the patient and they have rescheduled his colonoscopy to 4/30 so we cancelled his appt on 3/25 and will reschedule at a date closer to that.

## 2019-01-02 NOTE — Telephone Encounter (Signed)
-----   Message from Allayne Butcher, [place] sent at 12/31/2018  3:36 PM EDT ----- Regarding: Possible Reschedule Pt has appointment for clearance for colonoscopy. Need for appt next week is based on pt's procedure status. ? If it will be canceled if an elective procedure. If he is planning on having procedure done within the next month, then we can keep the appointment, if pt is willing to come out for visit. If procedure has been canceled and will be postponed, then we can move appt further out to reduce patient volume in the office next week. Discuss with pt and decide what would be best.

## 2019-01-07 ENCOUNTER — Ambulatory Visit: Payer: 59 | Admitting: Cardiology

## 2019-01-08 ENCOUNTER — Ambulatory Visit: Payer: 59 | Admitting: Cardiology

## 2019-01-14 ENCOUNTER — Telehealth: Payer: Self-pay

## 2019-01-14 NOTE — Progress Notes (Signed)
Virtual Visit via Video Note    Evaluation Performed:  Follow-up visit  This visit type was conducted due to national recommendations for restrictions regarding the COVID-19 Pandemic (e.g. social distancing).  This format is felt to be most appropriate for this patient at this time.  All issues noted in this document were discussed and addressed.  No physical exam was performed (except for noted visual exam findings with Video Visits).  Please refer to the patient's chart (MyChart message for video visits and phone note for telephone visits) for the patient's consent to telehealth for [facility].  Date:  01/15/2019   ID:  [name], DOB 1971-09-06, MRN [id number]  Patient Location:  Home  Provider location:  [place], [place]  PCP:  Douglas Dills, MD  Cardiologist:  Douglas Muss, MD  Electrophysiologist:  None   Chief Complaint:  AFib  History of Present Illness:    [name] is a 48 y.o. male who presents via audio/video conferencing for a telehealth visit today.    He has a hx of HTN, DM2, obesity, OSA on CPAP, prior ETOH/cocaine use and PAF. Remotely seen by Dr. Reyes Walsh in the past. He was evaluated by Dr. Delane Walsh in 4/14 in the hospital during an admission with PAF with RVR.  Ritalin was a drug to be avoided in the futuresince the AFib started with medicine. He converted to NSR and long-term anticoagulation was recommended given his stroke risk. Flecainide was added to maintain NSR. He was to follow up in this office for ETT to rule out pro-arrhythmia, but was not seen here until 2/16. Review of his chart indicates he had an ETT at [facility] in 02/2013. He was placed back on Flecainide and FU ETT-Myoview was low risk and neg for pro-arrhythmia.   CHADS2-VASc=2 (HTN, DM)  Patient has been taking Adderall for narcolepsy.  Now retired from his job with the city due to narcolepsy.  The patient does not have symptoms concerning for COVID-19  infection (fever, chills, cough, or new shortness of breath).   He has some DOE.   Denies : Chest pain. Dizziness. Leg edema. Nitroglycerin use. Orthopnea. Palpitations. Paroxysmal nocturnal dyspnea. Shortness of breath. Syncope.    Prior CV studies:   The following studies were reviewed today:  A1C 13.3 in 8/18  Past Medical History:  Diagnosis Date  . Diabetes mellitus   . GERD (gastroesophageal reflux disease)   . History of alcohol abuse   . History of cocaine use   . History of echocardiogram    a. Echo 4/14: Moderate LVH, vigorous LVEF, EF 65-70%, normal wall motion, grade 2 diastolic dysfunction, mildly dilated aortic root and ascending aorta, ascending aorta 40 mm, aortic root 38 mm, mild LAE  . Hx of cardiovascular stress test    a. GXT 5/14: No ischemic changes  //  b. ETT-Myoview 3/16:  Low risk, no ischemia, EF 58%  . Hyperlipidemia   . Hypertension   . Morbid obesity (HCC)   . Obesity   . Paroxysmal atrial fibrillation (HCC)    Occurring in 2008, with several recurrence since then (including in the setting of + cocaine on UDS).  . Sleep apnea    Past Surgical History:  Procedure Laterality Date  . APPENDECTOMY    . ROTATOR CUFF REPAIR    . TONSILLECTOMY       Current Meds  Medication Sig  . amphetamine-dextroamphetamine (ADDERALL XR) 30 MG 24 hr capsule Take 1 capsule (30 mg total) by  mouth daily as needed (For narcolepsy.).  Marland Kitchen atorvastatin (LIPITOR) 10 MG tablet Take 10 mg by mouth daily.  . cyclobenzaprine (FLEXERIL) 10 MG tablet Take 1 tablet (10 mg total) by mouth 2 (two) times daily as needed for muscle spasms.  . flecainide (TAMBOCOR) 100 MG tablet Take 1 tablet (100 mg total) by mouth 2 (two) times daily.  Marland Kitchen glipiZIDE (GLUCOTROL XL) 10 MG 24 hr tablet TAKE 1 TABLET (10 MG TOTAL) BY MOUTH DAILY.  Marland Kitchen lisinopril-hydrochlorothiazide (PRINZIDE,ZESTORETIC) 10-12.5 MG per tablet Take 1 tablet by mouth at bedtime.   . metoprolol succinate (TOPROL-XL) 25 MG 24  hr tablet Take 1 tablet (25 mg total) by mouth daily.  . pravastatin (PRAVACHOL) 40 MG tablet Take 1 tablet (40 mg total) by mouth every evening.  . rivaroxaban (XARELTO) 20 MG TABS tablet TAKE 1 TABLET (20 MG TOTAL) BY MOUTH DAILY WITH SUPPER.  . traZODone (DESYREL) 50 MG tablet Take 1 tablet (50 mg total) by mouth at bedtime.     Allergies:   Shrimp [shellfish allergy]; Banana; Watermelon flavor; Other; Peanut-containing drug products; Almond oil; and Sulfa antibiotics   Social History   Tobacco Use  . Smoking status: Current Every Day Smoker    Packs/day: 1.00    Years: 28.00    Pack years: 28.00    Last attempt to quit: 10/17/2007    Years since quitting: 11.2  . Smokeless tobacco: Former Neurosurgeon    Types: Snuff  . Tobacco comment: smokes one pack per day  Substance Use Topics  . Alcohol use: Yes    Comment: occasionally - 1 beer 1x/mont  . Drug use: No    Comment: cocaine in the past, none currently     Family Hx: The patient's family history includes Diabetes in his mother and paternal grandfather; Heart attack (age of onset: 76) in his father; Stroke in his maternal grandmother and paternal grandmother.  ROS:   Please see the history of present illness.     All other systems reviewed and are negative.   Labs/Other Tests and Data Reviewed:    Recent Labs: 11/30/2018: ALT 23; BUN 24; Creatinine, Ser 1.36; Hemoglobin 15.6; Platelets 380; Potassium 4.6; Sodium 133   Recent Lipid Panel Lab Results  Component Value Date/Time   CHOL 224 (H) 12/08/2016 08:52 AM   TRIG (H) 12/08/2016 08:52 AM    435.0 Triglyceride is over 400; calculations on Lipids are invalid.   HDL 32.50 (L) 12/08/2016 08:52 AM   CHOLHDL 7 12/08/2016 08:52 AM   LDLCALC 57 04/07/2016 08:51 AM   LDLDIRECT 103.0 12/08/2016 08:52 AM    Wt Readings from Last 3 Encounters:  01/15/19 280 lb (127 kg)  12/27/18 280 lb (127 kg)  01/08/18 283 lb 3.2 oz (128.5 kg)     Objective:    Vital Signs:  Ht 5\' 9"   (1.753 m)   Wt 280 lb (127 kg)   BMI 41.35 kg/m    Well nourished, well developed male in no acute distress. Exam limited by video format.  No apparent shortness of breath  ASSESSMENT & PLAN:    1.  AFib: No sx of AFib.  COntinue Xarelto for stroke prevention.  2.  Anticoagulated: On Xarelto.  No bleeding problems.  Will need colonoscopy.  Ok to hold Xarelto for colonoscopy when this is done.  3.  Hypertensive heart disease: Has been on and off meds.  Back on now.   4.  Diabetes: A1C > 11 in 2020 Feb.  Spoke  about diet and exercise.  Increased TG, should improve with better glucose control.  5. Tobacco abuse: He needs to stop smoking.    COVID-19 Education: The signs and symptoms of COVID-19 were discussed with the patient and how to seek care for testing (follow up with PCP or arrange E-visit).  The importance of social distancing was discussed today.  Patient Risk:   After full review of this patient's clinical status, I feel that they are at least moderate risk at this time.  Time:   Today, I have spent 25 minutes with the patient with telehealth technology discussing AFib, RF modification, BP management, COVID precautions.     Medication Adjustments/Labs and Tests Ordered: Current medicines are reviewed at length with the patient today.  Concerns regarding medicines are outlined above.  Tests Ordered: No orders of the defined types were placed in this encounter.  Medication Changes: No orders of the defined types were placed in this encounter.   Disposition:  Follow up in 6 month(s)  Signed, Douglas Muss, MD  01/15/2019 10:36 AM    [facility]

## 2019-01-14 NOTE — Telephone Encounter (Signed)
Virtual Visit Pre-Appointment Phone Call     TELEPHONE CALL NOTE  [name] has been deemed a candidate for a follow-up tele-health visit to limit community exposure during the Covid-19 pandemic. I spoke with the patient via phone to ensure availability of phone/video source, confirm preferred email & phone number, and discuss instructions and expectations.  I reminded [name] to be prepared with any vital sign and/or heart rhythm information that could potentially be obtained via home monitoring, at the time of his visit. I reminded [name] to expect a phone call at the time of his visit if his visit.  Did the patient verbally acknowledge consent to treatment? YES  Patient has been scheduled for a VIDEO Visit with Dr. Eldridge Dace on 4/1  Lattie Haw, RN 01/14/2019 5:25 PM   DOWNLOADING THE WEBEX SOFTWARE TO SMARTPHONE  - If Apple, go to [employer] and type in WebEx in the search bar. Download [employer], the blue/green circle. The app is free but as with any other app downloads, their phone may require them to verify saved payment information or Apple password. The patient does NOT have to create an account.  - If Android, ask patient to go to [employer] and type in WebEx in the search bar. Download [employer], the blue/green circle. The app is free but as with any other app downloads, their phone may require them to verify saved payment information or Android password. The patient does NOT have to create an account.   CONSENT FOR TELE-HEALTH VISIT - PLEASE REVIEW  I hereby voluntarily request, consent and authorize [facility] and its employed or contracted physicians, physician assistants, nurse practitioners or other licensed health care professionals (the Practitioner), to provide me with telemedicine health care services (the "Services") as deemed necessary by the treating Practitioner. I acknowledge and  consent to receive the Services by the Practitioner via telemedicine. I understand that the telemedicine visit will involve communicating with the Practitioner through live audiovisual communication technology and the disclosure of certain medical information by electronic transmission. I acknowledge that I have been given the opportunity to request an in-person assessment or other available alternative prior to the telemedicine visit and am voluntarily participating in the telemedicine visit.  I understand that I have the right to withhold or withdraw my consent to the use of telemedicine in the course of my care at any time, without affecting my right to future care or treatment, and that the Practitioner or I may terminate the telemedicine visit at any time. I understand that I have the right to inspect all information obtained and/or recorded in the course of the telemedicine visit and may receive copies of available information for a reasonable fee.  I understand that some of the potential risks of receiving the Services via telemedicine include:  Marland Kitchen Delay or interruption in medical evaluation due to technological equipment failure or disruption; . Information transmitted may not be sufficient (e.g. poor resolution of images) to allow for appropriate medical decision making by the Practitioner; and/or  . In rare instances, security protocols could fail, causing a breach of personal health information.  Furthermore, I acknowledge that it is my responsibility to provide information about my medical history, conditions and care that is complete and accurate to the best of my ability. I acknowledge that Practitioner's advice, recommendations, and/or decision may be based on factors not within their control, such as incomplete or inaccurate data provided by  me or distortions of diagnostic images or specimens that may result from electronic transmissions. I understand that the practice of medicine is not an  exact science and that Practitioner makes no warranties or guarantees regarding treatment outcomes. I acknowledge that I will receive a copy of this consent concurrently upon execution via email to the email address I last provided but may also request a printed copy by calling the office of [facility].    I understand that my insurance will be billed for this visit.   I have read or had this consent read to me. . I understand the contents of this consent, which adequately explains the benefits and risks of the Services being provided via telemedicine.  . I have been provided ample opportunity to ask questions regarding this consent and the Services and have had my questions answered to my satisfaction. . I give my informed consent for the services to be provided through the use of telemedicine in my medical care  By participating in this telemedicine visit I agree to the above.

## 2019-01-14 NOTE — Telephone Encounter (Signed)
Patient scheduled for VIDEO Visit with Dr. Eldridge Dace on 4/1

## 2019-01-15 ENCOUNTER — Encounter: Payer: Self-pay | Admitting: Interventional Cardiology

## 2019-01-15 ENCOUNTER — Telehealth (INDEPENDENT_AMBULATORY_CARE_PROVIDER_SITE_OTHER): Payer: 59 | Admitting: Interventional Cardiology

## 2019-01-15 ENCOUNTER — Other Ambulatory Visit: Payer: Self-pay

## 2019-01-15 VITALS — Ht 69.0 in | Wt 280.0 lb

## 2019-01-15 DIAGNOSIS — Z720 Tobacco use: Secondary | ICD-10-CM | POA: Diagnosis not present

## 2019-01-15 DIAGNOSIS — E119 Type 2 diabetes mellitus without complications: Secondary | ICD-10-CM

## 2019-01-15 DIAGNOSIS — I480 Paroxysmal atrial fibrillation: Secondary | ICD-10-CM

## 2019-01-15 DIAGNOSIS — E785 Hyperlipidemia, unspecified: Secondary | ICD-10-CM

## 2019-01-15 DIAGNOSIS — Z794 Long term (current) use of insulin: Secondary | ICD-10-CM

## 2019-01-15 DIAGNOSIS — Z7901 Long term (current) use of anticoagulants: Secondary | ICD-10-CM | POA: Diagnosis not present

## 2019-01-15 DIAGNOSIS — I119 Hypertensive heart disease without heart failure: Secondary | ICD-10-CM

## 2019-01-15 MED ORDER — RIVAROXABAN 20 MG PO TABS: ORAL_TABLET | ORAL | 1 refills | Status: DC

## 2019-01-15 NOTE — Patient Instructions (Addendum)
Medication Instructions:  Your physician recommends that you continue on your current medications as directed. Please refer to the Current Medication list given to you today.  If you need a refill on your cardiac medications before your next appointment, please call your pharmacy.   Lab work: None Ordered  If you have labs (blood work) drawn today and your tests are completely normal, you will receive your results only by: Marland Kitchen MyChart Message (if you have MyChart) OR . A paper copy in the mail If you have any lab test that is abnormal or we need to change your treatment, we will call you to review the results.  Testing/Procedures: None ordered  Follow-Up: At [facility], you and your health needs are our priority.  As part of our continuing mission to provide you with exceptional heart care, we have created designated Provider Care Teams.  These Care Teams include your primary Cardiologist (physician) and Advanced Practice Providers (APPs -  Physician Assistants and Nurse Practitioners) who all work together to provide you with the care you need, when you need it. . You will need a follow up appointment in 6 months.  Please call our office 2 months in advance to schedule this appointment.  You may see Everette Rank, MD or one of the following Advanced Practice Providers on your designated Care Team:   . Robbie Lis, PA-C . Dayna Dunn, PA-C . Jacolyn Reedy, PA-C  Any Other Special Instructions Will Be Listed Below (If Applicable).  1. DECREASE the amount of carbohydrates and sugars in your diet  2. Your provider recommends that you maintain 150 minutes per week of moderate aerobic activity.

## 2019-01-22 ENCOUNTER — Inpatient Hospital Stay (HOSPITAL_COMMUNITY): Payer: 59

## 2019-01-22 ENCOUNTER — Encounter (HOSPITAL_COMMUNITY): Payer: Self-pay

## 2019-01-22 ENCOUNTER — Inpatient Hospital Stay (HOSPITAL_COMMUNITY)
Admission: EM | Admit: 2019-01-22 | Discharge: 2019-01-24 | DRG: 439 | Disposition: A | Discharge status: Home / Self Care | Payer: 59 | Attending: Internal Medicine | Admitting: Internal Medicine

## 2019-01-22 ENCOUNTER — Emergency Department (HOSPITAL_COMMUNITY): Payer: 59

## 2019-01-22 ENCOUNTER — Other Ambulatory Visit: Payer: Self-pay

## 2019-01-22 DIAGNOSIS — K219 Gastro-esophageal reflux disease without esophagitis: Secondary | ICD-10-CM | POA: Diagnosis present

## 2019-01-22 DIAGNOSIS — Z823 Family history of stroke: Secondary | ICD-10-CM | POA: Diagnosis not present

## 2019-01-22 DIAGNOSIS — G4733 Obstructive sleep apnea (adult) (pediatric): Secondary | ICD-10-CM | POA: Diagnosis present

## 2019-01-22 DIAGNOSIS — Z833 Family history of diabetes mellitus: Secondary | ICD-10-CM

## 2019-01-22 DIAGNOSIS — I4891 Unspecified atrial fibrillation: Secondary | ICD-10-CM | POA: Diagnosis present

## 2019-01-22 DIAGNOSIS — I10 Essential (primary) hypertension: Secondary | ICD-10-CM | POA: Diagnosis not present

## 2019-01-22 DIAGNOSIS — K859 Acute pancreatitis without necrosis or infection, unspecified: Secondary | ICD-10-CM

## 2019-01-22 DIAGNOSIS — Z7984 Long term (current) use of oral hypoglycemic drugs: Secondary | ICD-10-CM

## 2019-01-22 DIAGNOSIS — Z91010 Allergy to peanuts: Secondary | ICD-10-CM

## 2019-01-22 DIAGNOSIS — Z6841 Body Mass Index (BMI) 40.0 and over, adult: Secondary | ICD-10-CM

## 2019-01-22 DIAGNOSIS — R52 Pain, unspecified: Secondary | ICD-10-CM | POA: Diagnosis not present

## 2019-01-22 DIAGNOSIS — E119 Type 2 diabetes mellitus without complications: Secondary | ICD-10-CM | POA: Diagnosis not present

## 2019-01-22 DIAGNOSIS — I5032 Chronic diastolic (congestive) heart failure: Secondary | ICD-10-CM | POA: Diagnosis present

## 2019-01-22 DIAGNOSIS — R1033 Periumbilical pain: Secondary | ICD-10-CM

## 2019-01-22 DIAGNOSIS — Z91013 Allergy to seafood: Secondary | ICD-10-CM

## 2019-01-22 DIAGNOSIS — R1084 Generalized abdominal pain: Secondary | ICD-10-CM | POA: Diagnosis not present

## 2019-01-22 DIAGNOSIS — Z794 Long term (current) use of insulin: Secondary | ICD-10-CM

## 2019-01-22 DIAGNOSIS — I110 Hypertensive heart disease with heart failure: Secondary | ICD-10-CM | POA: Diagnosis present

## 2019-01-22 DIAGNOSIS — Z8249 Family history of ischemic heart disease and other diseases of the circulatory system: Secondary | ICD-10-CM

## 2019-01-22 DIAGNOSIS — Z882 Allergy status to sulfonamides status: Secondary | ICD-10-CM

## 2019-01-22 DIAGNOSIS — I480 Paroxysmal atrial fibrillation: Secondary | ICD-10-CM | POA: Diagnosis not present

## 2019-01-22 DIAGNOSIS — E785 Hyperlipidemia, unspecified: Secondary | ICD-10-CM | POA: Diagnosis present

## 2019-01-22 DIAGNOSIS — Z9049 Acquired absence of other specified parts of digestive tract: Secondary | ICD-10-CM | POA: Diagnosis not present

## 2019-01-22 DIAGNOSIS — Z91018 Allergy to other foods: Secondary | ICD-10-CM | POA: Diagnosis not present

## 2019-01-22 DIAGNOSIS — Z87891 Personal history of nicotine dependence: Secondary | ICD-10-CM

## 2019-01-22 DIAGNOSIS — Z7901 Long term (current) use of anticoagulants: Secondary | ICD-10-CM

## 2019-01-22 DIAGNOSIS — E118 Type 2 diabetes mellitus with unspecified complications: Secondary | ICD-10-CM

## 2019-01-22 DIAGNOSIS — Z8719 Personal history of other diseases of the digestive system: Secondary | ICD-10-CM

## 2019-01-22 HISTORY — DX: Personal history of other diseases of the digestive system: Z87.19

## 2019-01-22 HISTORY — DX: Acute pancreatitis without necrosis or infection, unspecified: K85.90

## 2019-01-22 LAB — LIPID PANEL
Cholesterol: 137 mg/dL (ref 0–200)
HDL: 29 mg/dL — ABNORMAL LOW (ref >40)
LDL Cholesterol: 81 mg/dL (ref 0–99)
Total CHOL/HDL Ratio: 4.7 RATIO
Triglycerides: 133 mg/dL (ref <150)
VLDL: 27 mg/dL (ref 0–40)

## 2019-01-22 LAB — COMPREHENSIVE METABOLIC PANEL
ALT: 20 U/L (ref 0–44)
AST: 20 U/L (ref 15–41)
Albumin: 3.9 g/dL (ref 3.5–5.0)
Alkaline Phosphatase: 76 U/L (ref 38–126)
Anion gap: 10 (ref 5–15)
BUN: 25 mg/dL — ABNORMAL HIGH (ref 6–20)
CO2: 24 mmol/L (ref 22–32)
Calcium: 9.6 mg/dL (ref 8.9–10.3)
Chloride: 98 mmol/L (ref 98–111)
Creatinine, Ser: 1.45 mg/dL — ABNORMAL HIGH (ref 0.61–1.24)
GFR calc Af Amer: >60 mL/min (ref >60)
GFR calc non Af Amer: 57 mL/min — ABNORMAL LOW (ref >60)
Glucose, Bld: 236 mg/dL — ABNORMAL HIGH (ref 70–99)
Potassium: 4.2 mmol/L (ref 3.5–5.1)
Sodium: 132 mmol/L — ABNORMAL LOW (ref 135–145)
Total Bilirubin: 0.9 mg/dL (ref 0.3–1.2)
Total Protein: 7.9 g/dL (ref 6.5–8.1)

## 2019-01-22 LAB — GLUCOSE, CAPILLARY
Glucose-Capillary: 119 mg/dL — ABNORMAL HIGH (ref 70–99)
Glucose-Capillary: 121 mg/dL — ABNORMAL HIGH (ref 70–99)
Glucose-Capillary: 133 mg/dL — ABNORMAL HIGH (ref 70–99)
Glucose-Capillary: 175 mg/dL — ABNORMAL HIGH (ref 70–99)
Glucose-Capillary: 188 mg/dL — ABNORMAL HIGH (ref 70–99)

## 2019-01-22 LAB — CBC WITH DIFFERENTIAL/PLATELET
Abs Immature Granulocytes: 0.05 10*3/uL (ref 0.00–0.07)
Basophils Absolute: 0.1 10*3/uL (ref 0.0–0.1)
Basophils Relative: 1 %
Eosinophils Absolute: 0.5 10*3/uL (ref 0.0–0.5)
Eosinophils Relative: 5 %
HCT: 44.4 % (ref 39.0–52.0)
Hemoglobin: 14.9 g/dL (ref 13.0–17.0)
Immature Granulocytes: 1 %
Lymphocytes Relative: 14 %
Lymphs Abs: 1.6 10*3/uL (ref 0.7–4.0)
MCH: 27.4 pg (ref 26.0–34.0)
MCHC: 33.6 g/dL (ref 30.0–36.0)
MCV: 81.6 fL (ref 80.0–100.0)
Monocytes Absolute: 1.1 10*3/uL — ABNORMAL HIGH (ref 0.1–1.0)
Monocytes Relative: 10 %
Neutro Abs: 7.8 10*3/uL — ABNORMAL HIGH (ref 1.7–7.7)
Neutrophils Relative %: 69 %
Platelets: 342 10*3/uL (ref 150–400)
RBC: 5.44 MIL/uL (ref 4.22–5.81)
RDW: 12.6 % (ref 11.5–15.5)
WBC: 11.1 10*3/uL — ABNORMAL HIGH (ref 4.0–10.5)
nRBC: 0 % (ref 0.0–0.2)

## 2019-01-22 LAB — URINALYSIS, ROUTINE W REFLEX MICROSCOPIC
Bacteria, UA: NONE SEEN
Bilirubin Urine: NEGATIVE
Glucose, UA: 150 mg/dL — AB
Ketones, ur: 5 mg/dL — AB
Leukocytes,Ua: NEGATIVE
Nitrite: NEGATIVE
Protein, ur: 100 mg/dL — AB
Specific Gravity, Urine: 1.012 (ref 1.005–1.030)
pH: 5 (ref 5.0–8.0)

## 2019-01-22 LAB — LIPASE, BLOOD: Lipase: 38 U/L (ref 11–51)

## 2019-01-22 LAB — LACTIC ACID, PLASMA: Lactic Acid, Venous: 1.1 mmol/L (ref 0.5–1.9)

## 2019-01-22 IMAGING — US ULTRASOUND ABDOMEN LIMITED
1 series · 14 of 25 positions shown · non-contrast
Comparison: [DATE]

CLINICAL DATA: Pancreatitis

EXAM:
ULTRASOUND ABDOMEN LIMITED RIGHT UPPER QUADRANT

[Series 1: ultrasound abdomen limited · 14 of 43 slices shown]
[im 1/43]
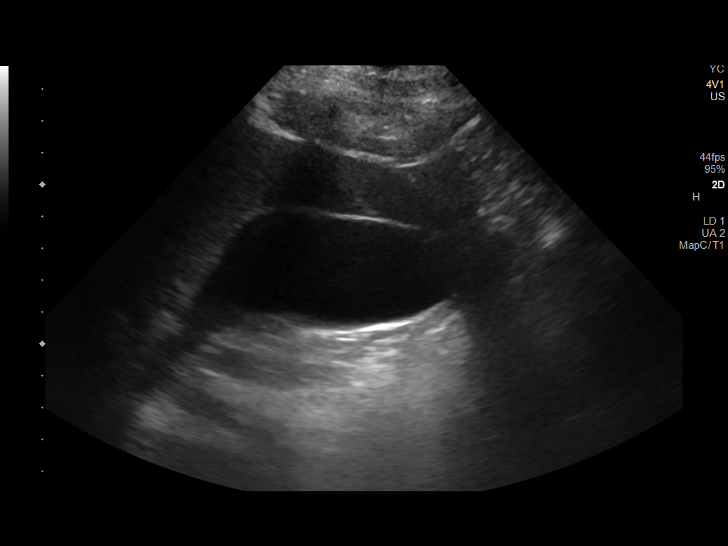
[im 4/43]
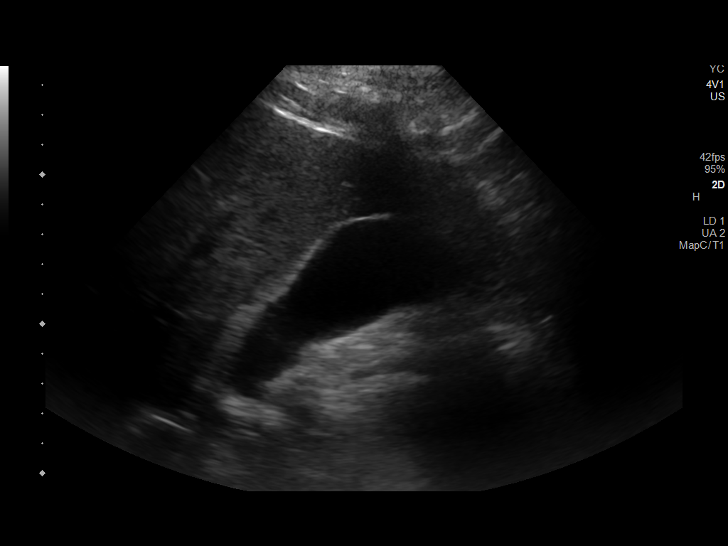
[im 8/43]
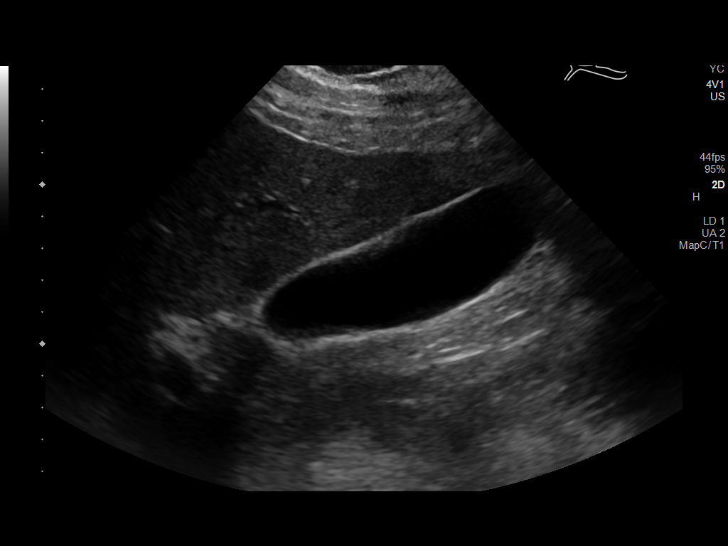
[im 11/43]
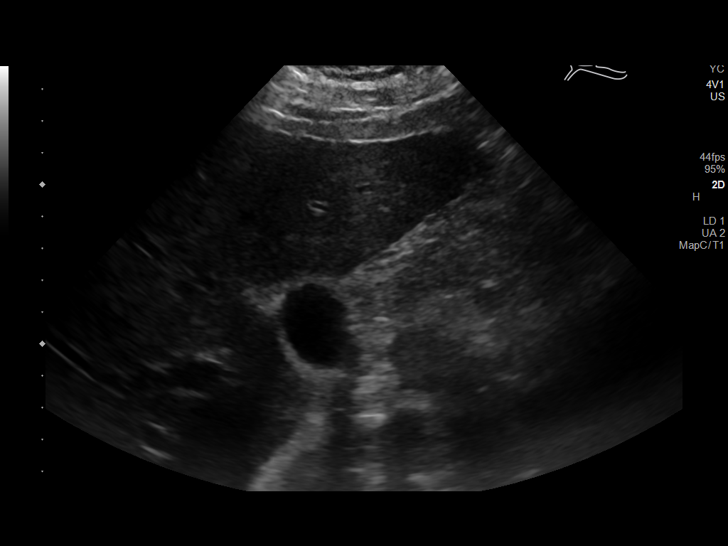
[im 15/43]
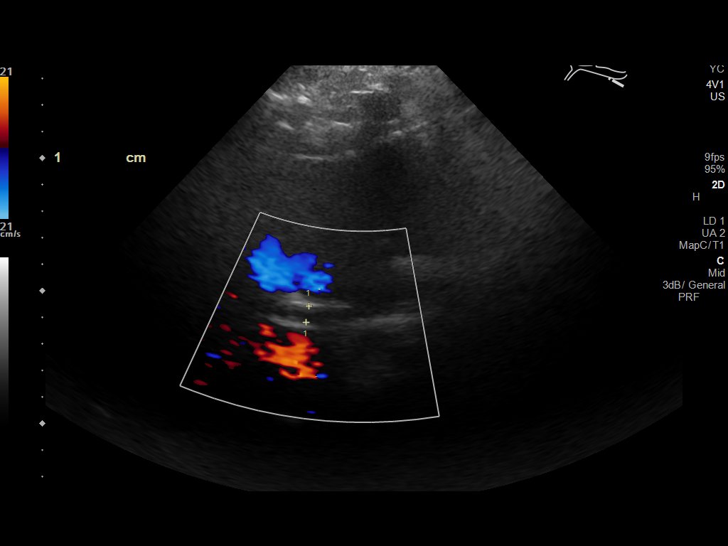
[im 16/43]
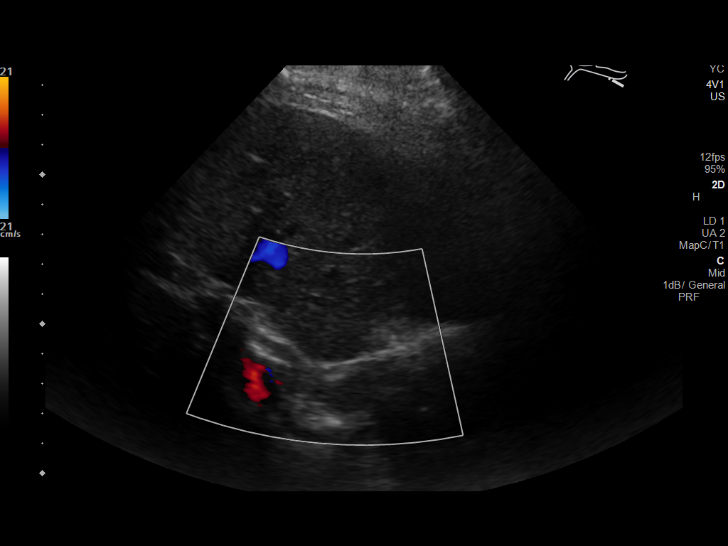
[im 20/43]
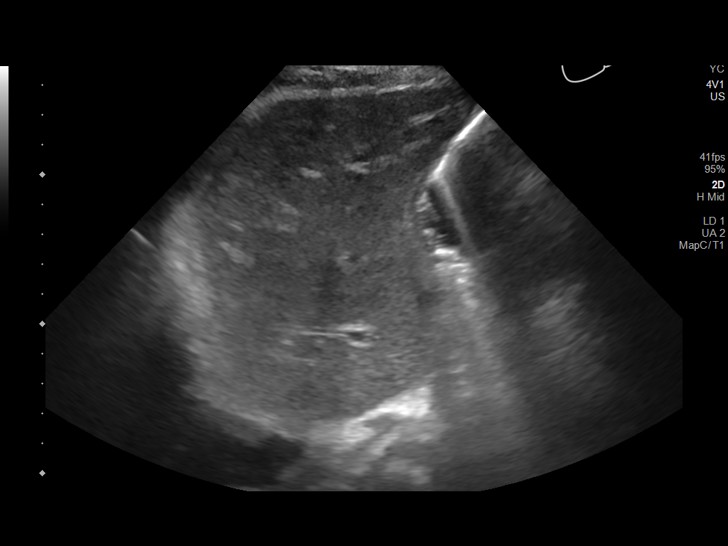
[im 23/43]
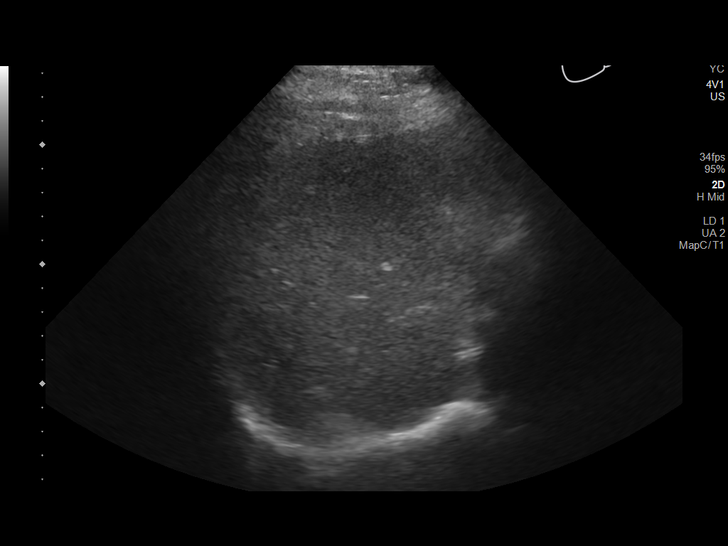
[im 27/43]
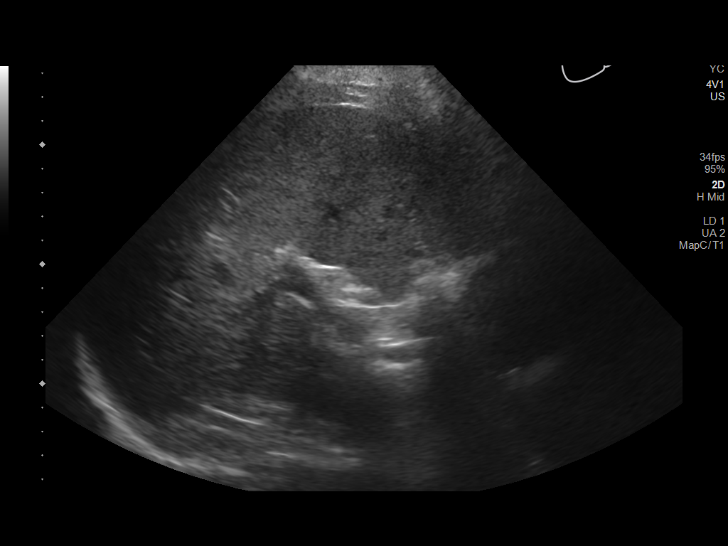
[im 29/43]
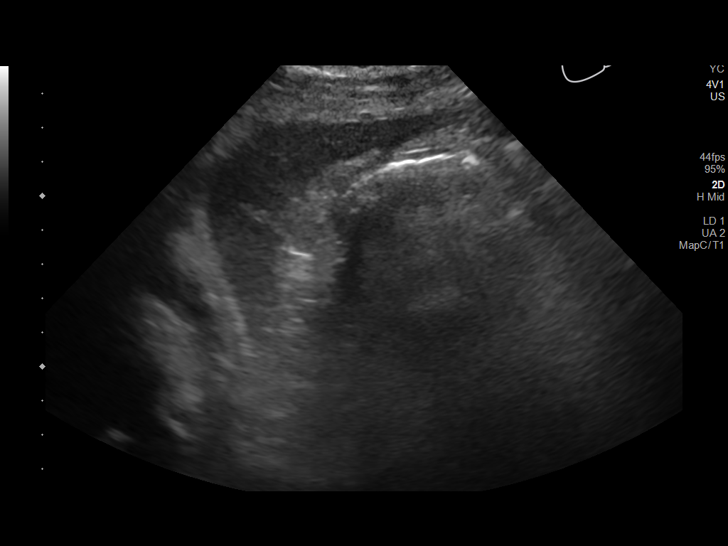
[im 32/43]
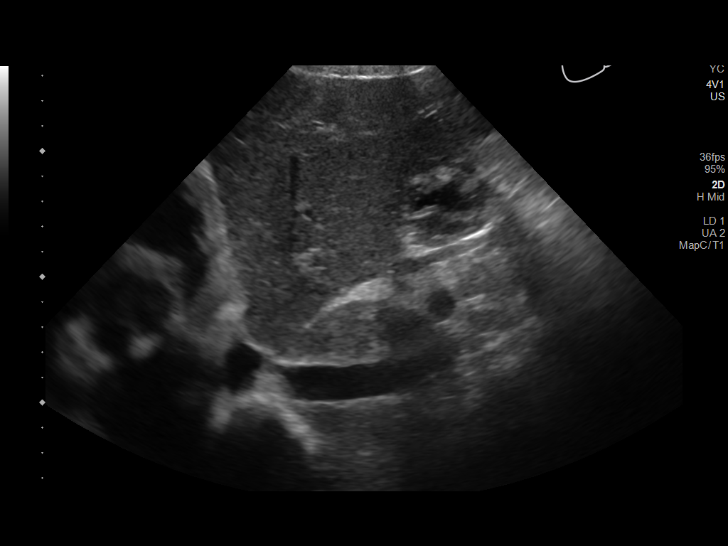
[im 36/43]
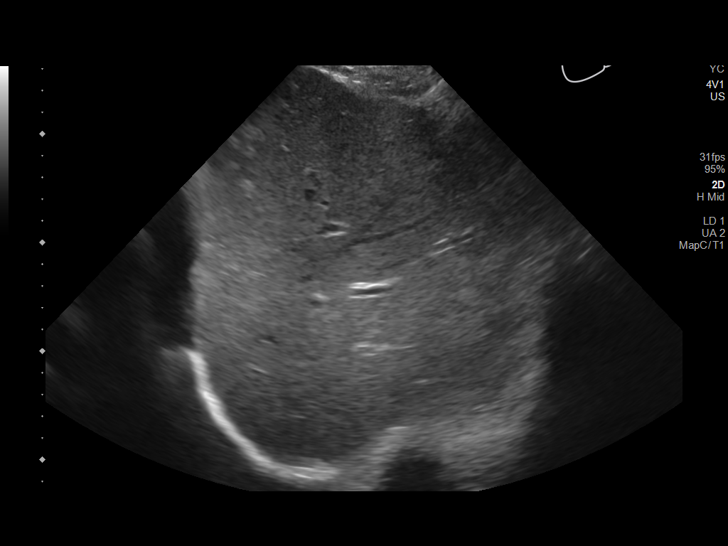
[im 39/43]
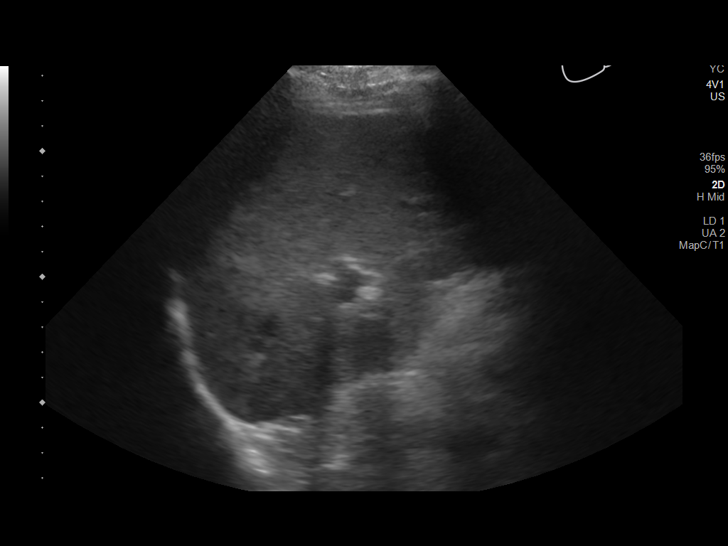
[im 43/43]
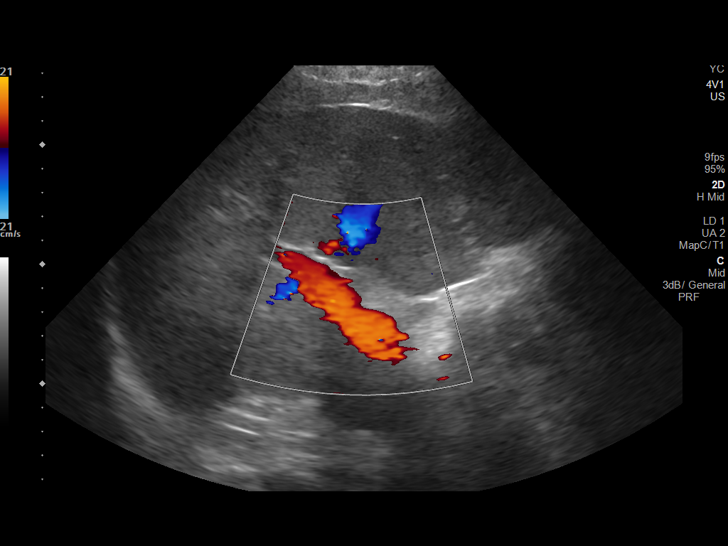

[14 of 25 positions shown; findings below may reference images not displayed]

FINDINGS: Gallbladder:

No gallstones or wall thickening visualized. No sonographic Murphy
sign noted by sonographer.

Common bile duct:

Diameter: Normal caliber, 6 mm

Liver:

No focal lesion identified. Within normal limits in parenchymal
echogenicity. Portal vein is patent on color Doppler imaging with
normal direction of blood flow towards the liver.
IMPRESSION: Normal right upper quadrant ultrasound.

## 2019-01-22 IMAGING — CT CT ABDOMEN AND PELVIS WITH CONTRAST
2 of 5 series · 17 of 46 positions shown, 19 images · IV contrast (omnipaque)
Comparison: [DATE]

CLINICAL DATA: Worsening abdominal pain with nausea for 2 months

EXAM:
CT ABDOMEN AND PELVIS WITH CONTRAST
TECHNIQUE: Multidetector CT imaging of the abdomen and pelvis was performed
using the standard protocol following bolus administration of
intravenous contrast.
CONTRAST:  125mL OMNIPAQUE IOHEXOL 300 MG/ML  SOLN

[Series 3: abdomen 5.0 · axial · 0.95mm/px · z∈[+642,+1086]mm · 14 of 103 slices shown, 16 images]
[im 7/103  soft-tissue]
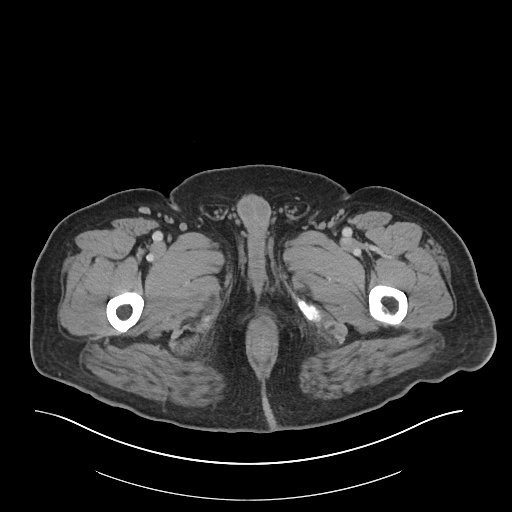
[im 7/103  bone]
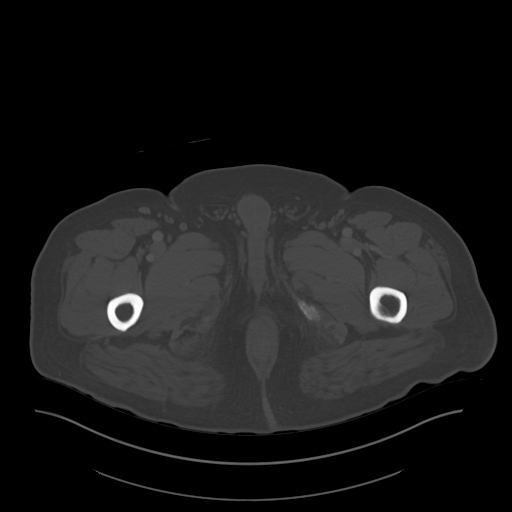
[im 13/103  soft-tissue]
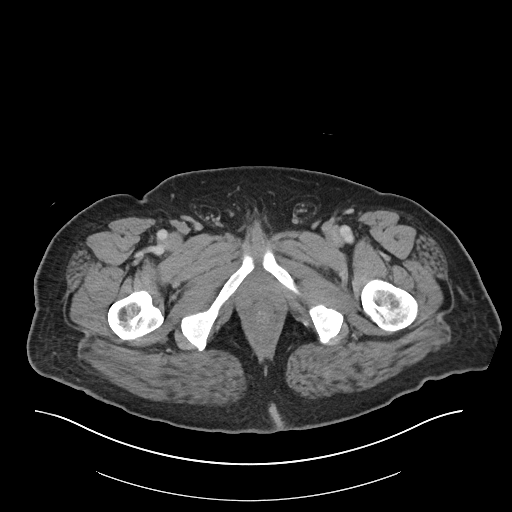
[im 20/103  soft-tissue]
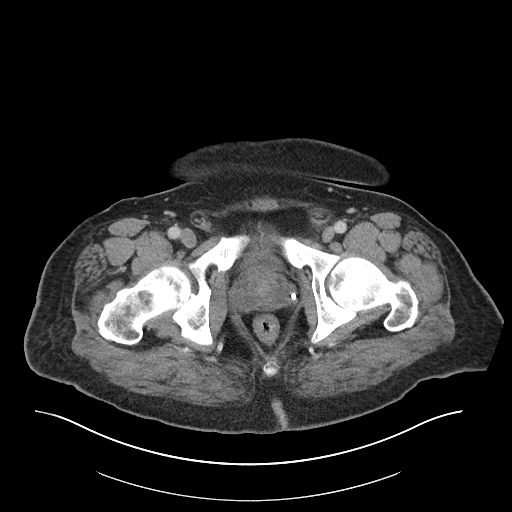
[im 26/103  soft-tissue]
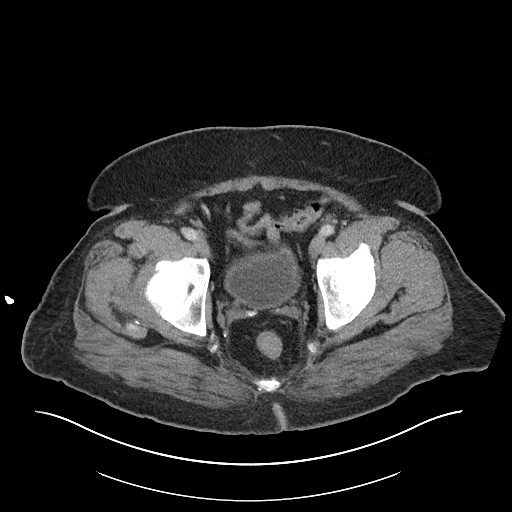
[im 32/103  soft-tissue]
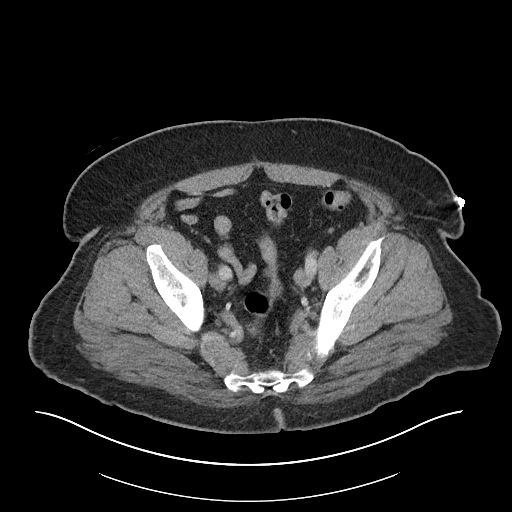
[im 39/103  soft-tissue]
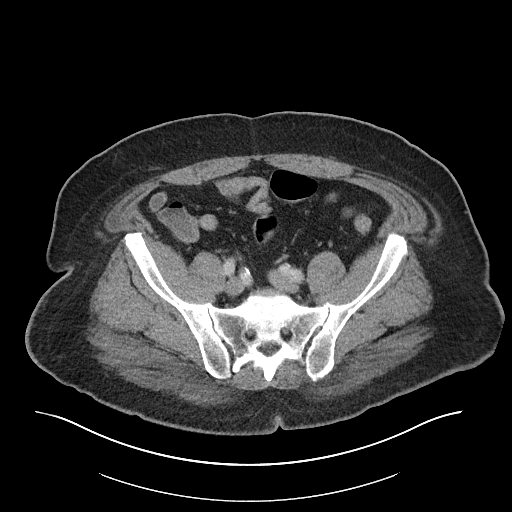
[im 45/103  soft-tissue]
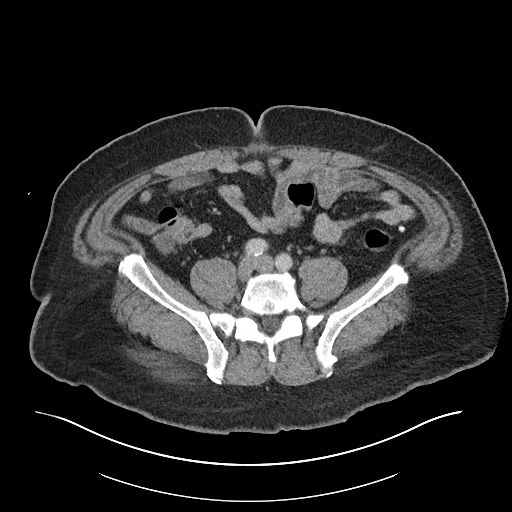
[im 58/103  soft-tissue]
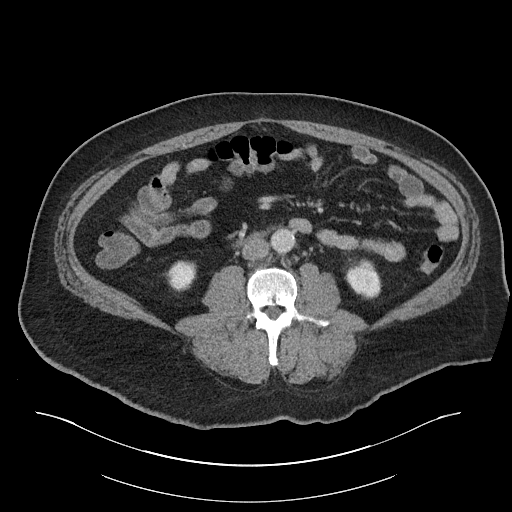
[im 64/103  soft-tissue]
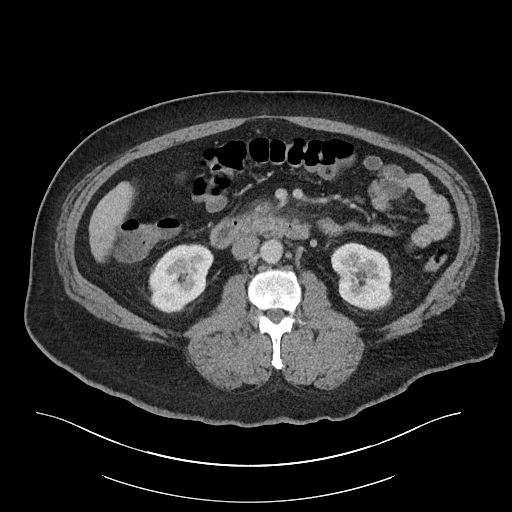
[im 64/103  bone]
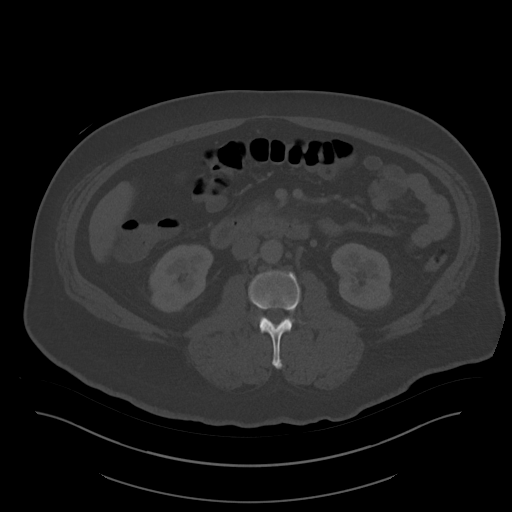
[im 71/103  soft-tissue]
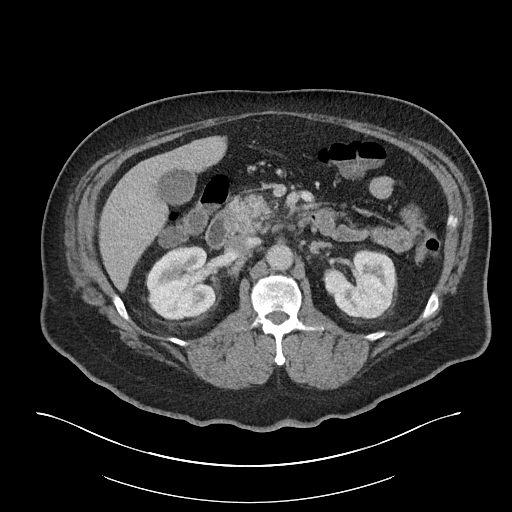
[im 77/103  soft-tissue]
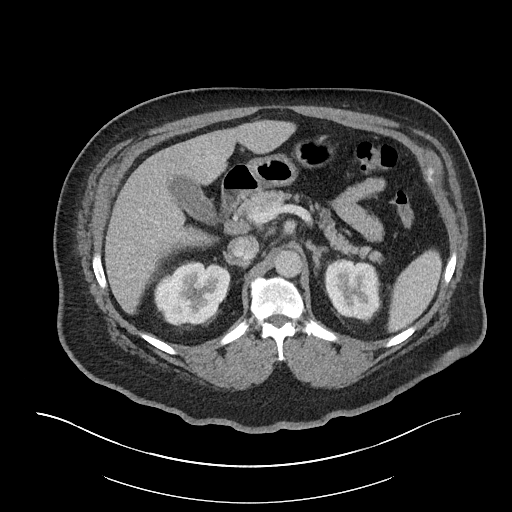
[im 83/103  soft-tissue]
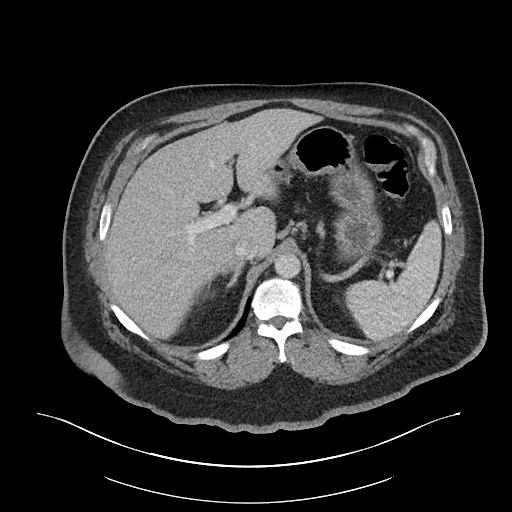
[im 90/103  soft-tissue]
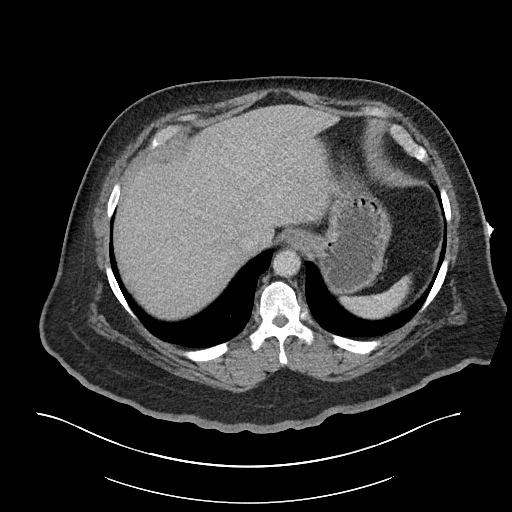
[im 96/103  soft-tissue]
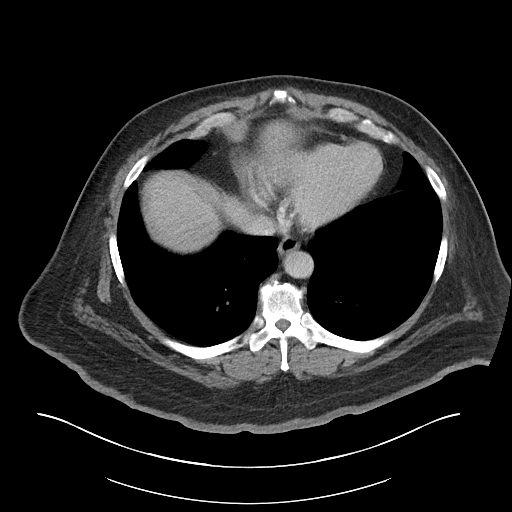

[Series 6: abdomen 3.0 mpr cor · coronal · 1.02mm/px · 3 of 120 slices shown]
[im 40/120  soft-tissue]
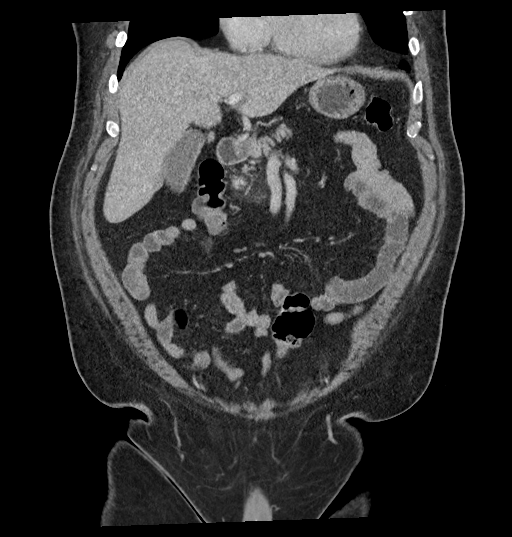
[im 53/120  soft-tissue]
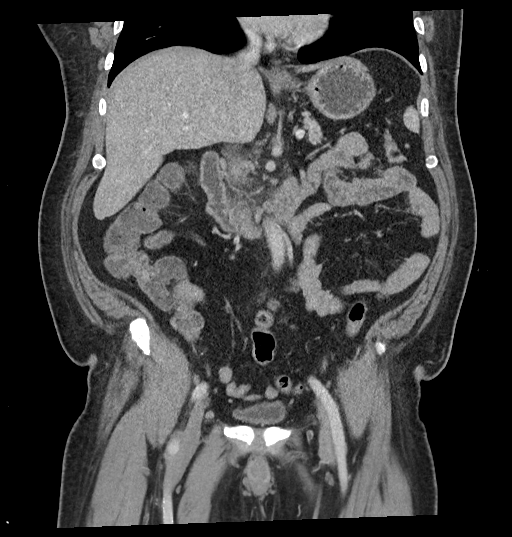
[im 67/120  soft-tissue]
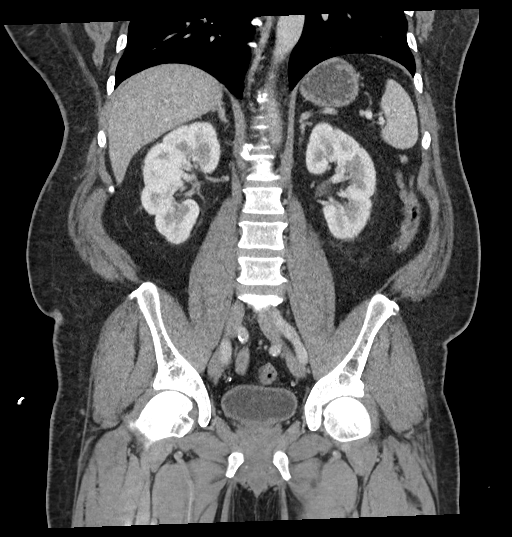

[17 of 46 positions shown; findings below may reference images not displayed]

FINDINGS: Lower chest:  No contributory findings.

Hepatobiliary: No focal liver abnormality.No evidence of biliary
obstruction or stone.

Pancreas: Expansion of the uncinate process with adjacent fat edema
and mild nodal enlargement. The neighboring duodenum is non
thickened.

Spleen: Unremarkable.

Adrenals/Urinary Tract: Negative adrenals. No hydronephrosis or
stone. Unremarkable bladder.

Stomach/Bowel:  No obstruction. Appendectomy

Vascular/Lymphatic: No acute vascular abnormality. Mild
atherosclerotic calcification. No mass or adenopathy.

Reproductive:Proximal vas deferens calcification.

Other: No ascites or pneumoperitoneum.

Musculoskeletal: No acute abnormalities.
IMPRESSION: Acute pancreatitis at the uncinate process. No necrosis or
collection.

## 2019-01-22 MED ORDER — SODIUM CHLORIDE 0.9 % IV BOLUS
1000.0000 mL | Freq: Once | INTRAVENOUS | Status: AC
  Administered 2019-01-22: 1000 mL via INTRAVENOUS

## 2019-01-22 MED ORDER — SODIUM CHLORIDE 0.9 % IV SOLN
INTRAVENOUS | Status: DC
  Administered 2019-01-22 – 2019-01-23 (×5): via INTRAVENOUS

## 2019-01-22 MED ORDER — HYDRALAZINE HCL 20 MG/ML IJ SOLN
5.0000 mg | INTRAMUSCULAR | Status: DC | PRN
  Administered 2019-01-23 – 2019-01-24 (×2): 5 mg via INTRAVENOUS
  Filled 2019-01-22 (×2): qty 1

## 2019-01-22 MED ORDER — ATORVASTATIN CALCIUM 10 MG PO TABS
10.0000 mg | ORAL_TABLET | Freq: Every day | ORAL | Status: DC
  Administered 2019-01-22 – 2019-01-24 (×3): 10 mg via ORAL
  Filled 2019-01-22 (×3): qty 1

## 2019-01-22 MED ORDER — FLECAINIDE ACETATE 50 MG PO TABS
100.0000 mg | ORAL_TABLET | Freq: Two times a day (BID) | ORAL | Status: DC
  Administered 2019-01-22 – 2019-01-24 (×5): 100 mg via ORAL
  Filled 2019-01-22 (×4): qty 2

## 2019-01-22 MED ORDER — SODIUM CHLORIDE 0.9 % IV BOLUS
1000.0000 mL | Freq: Once | INTRAVENOUS | Status: AC
  Administered 2019-01-22: 02:00:00 1000 mL via INTRAVENOUS

## 2019-01-22 MED ORDER — INSULIN ASPART 100 UNIT/ML ~~LOC~~ SOLN
0.0000 [IU] | SUBCUTANEOUS | Status: DC
  Administered 2019-01-22 (×2): 2 [IU] via SUBCUTANEOUS
  Administered 2019-01-22: 16:00:00 1 [IU] via SUBCUTANEOUS
  Administered 2019-01-23: 2 [IU] via SUBCUTANEOUS
  Administered 2019-01-23: 1 [IU] via SUBCUTANEOUS
  Administered 2019-01-23 – 2019-01-24 (×2): 2 [IU] via SUBCUTANEOUS
  Administered 2019-01-24 (×2): 1 [IU] via SUBCUTANEOUS

## 2019-01-22 MED ORDER — OXYCODONE HCL 5 MG PO TABS
5.0000 mg | ORAL_TABLET | ORAL | Status: DC | PRN
  Administered 2019-01-23: 5 mg via ORAL
  Filled 2019-01-22: qty 1

## 2019-01-22 MED ORDER — ONDANSETRON HCL 4 MG/2ML IJ SOLN
4.0000 mg | Freq: Four times a day (QID) | INTRAMUSCULAR | Status: DC | PRN
  Administered 2019-01-22 (×2): 4 mg via INTRAVENOUS
  Filled 2019-01-22 (×2): qty 2

## 2019-01-22 MED ORDER — ACETAMINOPHEN 325 MG PO TABS: 650.0000 mg | ORAL_TABLET | Freq: Four times a day (QID) | ORAL | Status: DC | PRN

## 2019-01-22 MED ORDER — HYDROCHLOROTHIAZIDE 12.5 MG PO CAPS: 12.5000 mg | ORAL_CAPSULE | Freq: Every day | ORAL | Status: DC

## 2019-01-22 MED ORDER — ACETAMINOPHEN 650 MG RE SUPP: 650.0000 mg | Freq: Four times a day (QID) | RECTAL | Status: DC | PRN

## 2019-01-22 MED ORDER — FENTANYL CITRATE (PF) 100 MCG/2ML IJ SOLN
50.0000 ug | Freq: Once | INTRAMUSCULAR | Status: AC
  Administered 2019-01-22: 50 ug via INTRAVENOUS
  Filled 2019-01-22: qty 2

## 2019-01-22 MED ORDER — HYDROMORPHONE HCL 1 MG/ML IJ SOLN
1.0000 mg | Freq: Once | INTRAMUSCULAR | Status: AC
  Administered 2019-01-22: 1 mg via INTRAVENOUS
  Filled 2019-01-22: qty 1

## 2019-01-22 MED ORDER — HYDROMORPHONE HCL 1 MG/ML IJ SOLN
1.0000 mg | INTRAMUSCULAR | Status: DC | PRN
  Administered 2019-01-22 – 2019-01-24 (×9): 1 mg via INTRAVENOUS
  Filled 2019-01-22 (×9): qty 1

## 2019-01-22 MED ORDER — PROMETHAZINE HCL 25 MG/ML IJ SOLN
12.5000 mg | Freq: Once | INTRAMUSCULAR | Status: AC
  Administered 2019-01-22: 12.5 mg via INTRAVENOUS
  Filled 2019-01-22: qty 1

## 2019-01-22 MED ORDER — LISINOPRIL-HYDROCHLOROTHIAZIDE 10-12.5 MG PO TABS: 1.0000 | ORAL_TABLET | Freq: Every day | ORAL | Status: DC

## 2019-01-22 MED ORDER — ONDANSETRON HCL 4 MG/2ML IJ SOLN
4.0000 mg | Freq: Once | INTRAMUSCULAR | Status: AC
  Administered 2019-01-22: 4 mg via INTRAVENOUS
  Filled 2019-01-22: qty 2

## 2019-01-22 MED ORDER — RIVAROXABAN 20 MG PO TABS
20.0000 mg | ORAL_TABLET | Freq: Every day | ORAL | Status: DC
  Administered 2019-01-22 – 2019-01-23 (×2): 20 mg via ORAL
  Filled 2019-01-22 (×2): qty 1

## 2019-01-22 MED ORDER — LISINOPRIL 10 MG PO TABS
10.0000 mg | ORAL_TABLET | Freq: Every day | ORAL | Status: DC
  Administered 2019-01-22 – 2019-01-23 (×2): 10 mg via ORAL
  Filled 2019-01-22 (×2): qty 1

## 2019-01-22 MED ORDER — ONDANSETRON HCL 4 MG PO TABS
4.0000 mg | ORAL_TABLET | Freq: Four times a day (QID) | ORAL | Status: DC | PRN
  Administered 2019-01-23 – 2019-01-24 (×2): 4 mg via ORAL
  Filled 2019-01-22 (×2): qty 1

## 2019-01-22 MED ORDER — IOHEXOL 300 MG/ML  SOLN
125.0000 mL | Freq: Once | INTRAMUSCULAR | Status: AC | PRN
  Administered 2019-01-22: 125 mL via INTRAVENOUS

## 2019-01-22 MED ORDER — METOPROLOL SUCCINATE ER 25 MG PO TB24
25.0000 mg | ORAL_TABLET | Freq: Every day | ORAL | Status: DC
  Administered 2019-01-22 – 2019-01-24 (×3): 25 mg via ORAL
  Filled 2019-01-22 (×3): qty 1

## 2019-01-22 NOTE — ED Notes (Signed)
Attempted to call report to 5W, nurse was reported to be doing a dressing change.

## 2019-01-22 NOTE — H&P (Addendum)
History and Physical    [name] YNW:[id number] DOB: [birth year] DOA: 01/22/2019  PCP: Renford Dills, MD  Patient coming from: Home  Chief Complaint: Abdominal pain and nausea   HPI: [name] is a 48 y.o. male with medical history significant of paroxysmal A. fib on Xarelto, diabetes mellitus, hypertension, hyperlipidemia, morbid obesity who presents with central abdominal pain going on since February as well as nausea without vomiting.  He states that the pain is difficult to pinpoint but points to his epigastric and periumbilical region.  Pain has been coming and going.  Has had nausea, unable to keep any food down since Saturday.  Denies any fevers, shortness of breath, cough, diarrhea, peripheral edema.  He has never had this before.  He states that he drinks 1 beer maybe on a monthly basis, last alcoholic beverage was in January.  ED Course: Labs revealed WBC 11.1, lactic acid 1.1, lipase was normal 38.  CT abdomen pelvis did reveal acute pancreatitis.  Review of Systems: As per HPI otherwise 10 point review of systems negative.   Past Medical History:  Diagnosis Date  . Diabetes mellitus   . GERD (gastroesophageal reflux disease)   . History of alcohol abuse   . History of cocaine use   . History of echocardiogram    a. Echo 4/14: Moderate LVH, vigorous LVEF, EF 65-70%, normal wall motion, grade 2 diastolic dysfunction, mildly dilated aortic root and ascending aorta, ascending aorta 40 mm, aortic root 38 mm, mild LAE  . Hx of cardiovascular stress test    a. GXT 5/14: No ischemic changes  //  b. ETT-Myoview 3/16:  Low risk, no ischemia, EF 58%  . Hyperlipidemia   . Hypertension   . Morbid obesity (HCC)   . Obesity   . Paroxysmal atrial fibrillation (HCC)    Occurring in 2008, with several recurrence since then (including in the setting of + cocaine on UDS).  . Sleep apnea     Past Surgical History:  Procedure Laterality Date  . APPENDECTOMY    .  ROTATOR CUFF REPAIR    . TONSILLECTOMY       reports that he has been smoking. He has a 28.00 pack-year smoking history. He has quit using smokeless tobacco.  His smokeless tobacco use included snuff. He reports current alcohol use. He reports that he does not use drugs.  Allergies  Allergen Reactions  . Shrimp [Shellfish Allergy] Anaphylaxis  . Banana Other (See Comments)    Pt gags  . Watermelon Flavor Nausea And Vomiting  . Other Itching    Grapes  . Peanut-Containing Drug Products Itching and Cough  . Almond Oil Itching    Roof of mouth itches  . Sulfa Antibiotics Itching    Family History  Problem Relation Age of Onset  . Diabetes Mother   . Heart attack Father 55  . Stroke Maternal Grandmother   . Stroke Paternal Grandmother   . Diabetes Paternal Grandfather     Prior to Admission medications   Medication Sig Start Date End Date Taking? Authorizing Provider  atorvastatin (LIPITOR) 10 MG tablet Take 10 mg by mouth daily. 06/08/17  Yes [provider]  ciprofloxacin (CIPRO) 500 MG tablet Take 500 mg by mouth 2 (two) times daily.   Yes [provider]  flecainide (TAMBOCOR) 100 MG tablet Take 1 tablet (100 mg total) by mouth 2 (two) times daily. 01/08/18  Yes Corky Crafts, MD  glipiZIDE (GLUCOTROL) 10 MG tablet Take 10  mg by mouth 2 (two) times daily. 01/06/19  Yes [provider]  lisinopril-hydrochlorothiazide (PRINZIDE,ZESTORETIC) 10-12.5 MG per tablet Take 1 tablet by mouth at bedtime.    Yes [provider]  metFORMIN (GLUCOPHAGE) 500 MG tablet Take 1,000 mg by mouth 2 (two) times daily. 01/06/19  Yes [provider]  metoprolol succinate (TOPROL-XL) 25 MG 24 hr tablet Take 1 tablet (25 mg total) by mouth daily. 01/08/18  Yes Corky Crafts, MD  metroNIDAZOLE (FLAGYL) 500 MG tablet Take 500 mg by mouth 3 (three) times daily.   Yes [provider]  rivaroxaban (XARELTO) 20 MG TABS tablet TAKE 1 TABLET (20 MG  TOTAL) BY MOUTH DAILY WITH SUPPER. 01/15/19  Yes Corky Crafts, MD  amphetamine-dextroamphetamine (ADDERALL XR) 30 MG 24 hr capsule Take 1 capsule (30 mg total) by mouth daily as needed (For narcolepsy.). Patient not taking: Reported on 01/22/2019 04/15/18   Waymon Budge, MD  cyclobenzaprine (FLEXERIL) 10 MG tablet Take 1 tablet (10 mg total) by mouth 2 (two) times daily as needed for muscle spasms. Patient not taking: Reported on 01/22/2019 12/27/18   Janne Napoleon, NP  glipiZIDE (GLUCOTROL XL) 10 MG 24 hr tablet TAKE 1 TABLET (10 MG TOTAL) BY MOUTH DAILY. Patient not taking: Reported on 01/22/2019 02/05/17   Veryl Speak, FNP  pravastatin (PRAVACHOL) 40 MG tablet Take 1 tablet (40 mg total) by mouth every evening. Patient not taking: Reported on 01/22/2019 04/10/16   Tereso Newcomer T, PA-C  traZODone (DESYREL) 50 MG tablet Take 1 tablet (50 mg total) by mouth at bedtime. Patient not taking: Reported on 01/22/2019 03/09/17   Waymon Budge, MD    Physical Exam: Vitals:   01/22/19 0229 01/22/19 0230  BP: 136/83   Pulse: 95   Resp: 16   Temp: 98.1 F (36.7 C)   TempSrc: Oral   SpO2: 97%   Weight:  127 kg  Height:  5\' 9"  (1.753 m)    Constitutional: NAD, calm, comfortable Eyes: PERRL, lids and conjunctivae normal ENMT: Mucous membranes are moist. Posterior pharynx clear of any exudate or lesions.Normal dentition.  Neck: normal, supple, no masses, no thyromegaly Respiratory: clear to auscultation bilaterally, no wheezing, no crackles. Normal respiratory effort. No accessory muscle use.  Cardiovascular: Regular rate and rhythm, no murmurs / rubs / gallops. No extremity edema.  Abdomen: Mild tenderness to palpation epigastric region, hypoactive bowel sounds, soft to palpation Musculoskeletal: no clubbing / cyanosis. No joint deformity upper and lower extremities. Good ROM, no contractures. Normal muscle tone.  Skin: no rashes, lesions, ulcers. No induration Neurologic: CN 2-12 grossly  intact. Strength 5/5 in all 4.  No focal deficits.  Speech clear Psychiatric: Normal judgment and insight. Alert and oriented x 3. Normal mood.   Labs on Admission: I have personally reviewed following labs and imaging studies  CBC: Recent Labs  Lab 01/22/19 0243  WBC 11.1*  NEUTROABS 7.8*  HGB 14.9  HCT 44.4  MCV 81.6  PLT 342   Basic Metabolic Panel: Recent Labs  Lab 01/22/19 0243  NA 132*  K 4.2  CL 98  CO2 24  GLUCOSE 236*  BUN 25*  CREATININE 1.45*  CALCIUM 9.6   GFR: Estimated Creatinine Clearance: 82.1 mL/min (A) (by C-G formula based on SCr of 1.45 mg/dL (H)). Liver Function Tests: Recent Labs  Lab 01/22/19 0243  AST 20  ALT 20  ALKPHOS 76  BILITOT 0.9  PROT 7.9  ALBUMIN 3.9   Recent Labs  Lab 01/22/19 0243  LIPASE 38   No results for input(s): AMMONIA in the last 168 hours. Coagulation Profile: No results for input(s): INR, PROTIME in the last 168 hours. Cardiac Enzymes: No results for input(s): CKTOTAL, CKMB, CKMBINDEX, TROPONINI in the last 168 hours. BNP (last 3 results) No results for input(s): PROBNP in the last 8760 hours. HbA1C: No results for input(s): HGBA1C in the last 72 hours. CBG: No results for input(s): GLUCAP in the last 168 hours. Lipid Profile: No results for input(s): CHOL, HDL, LDLCALC, TRIG, CHOLHDL, LDLDIRECT in the last 72 hours. Thyroid Function Tests: No results for input(s): TSH, T4TOTAL, FREET4, T3FREE, THYROIDAB in the last 72 hours. Anemia Panel: No results for input(s): VITAMINB12, FOLATE, FERRITIN, TIBC, IRON, RETICCTPCT in the last 72 hours. Urine analysis:    Component Value Date/Time   COLORURINE YELLOW 01/22/2019 0507   APPEARANCEUR CLEAR 01/22/2019 0507   LABSPEC 1.012 01/22/2019 0507   PHURINE 5.0 01/22/2019 0507   GLUCOSEU 150 (A) 01/22/2019 0507   HGBUR MODERATE (A) 01/22/2019 0507   BILIRUBINUR NEGATIVE 01/22/2019 0507   KETONESUR 5 (A) 01/22/2019 0507   PROTEINUR 100 (A) 01/22/2019 0507    UROBILINOGEN 1.0 11/19/2013 1855   NITRITE NEGATIVE 01/22/2019 0507   LEUKOCYTESUR NEGATIVE 01/22/2019 0507   Sepsis Labs: !!!!!!!!!!!!!!!!!!!!!!!!!!!!!!!!!!!!!!!!!!!! @LABRCNTIP (procalcitonin:4,lacticidven:4) )No results found for this or any previous visit (from the past 240 hour(s)).   Radiological Exams on Admission: Ct Abdomen Pelvis W Contrast  Result Date: 01/22/2019 CLINICAL DATA:  Worsening abdominal pain with nausea for 2 months EXAM: CT ABDOMEN AND PELVIS WITH CONTRAST TECHNIQUE: Multidetector CT imaging of the abdomen and pelvis was performed using the standard protocol following bolus administration of intravenous contrast. CONTRAST:  125mL OMNIPAQUE IOHEXOL 300 MG/ML  SOLN COMPARISON:  11/30/2018 FINDINGS: Lower chest:  No contributory findings. Hepatobiliary: No focal liver abnormality.No evidence of biliary obstruction or stone. Pancreas: Expansion of the uncinate process with adjacent fat edema and mild nodal enlargement. The neighboring duodenum is non thickened. Spleen: Unremarkable. Adrenals/Urinary Tract: Negative adrenals. No hydronephrosis or stone. Unremarkable bladder. Stomach/Bowel:  No obstruction. Appendectomy Vascular/Lymphatic: No acute vascular abnormality. Mild atherosclerotic calcification. No mass or adenopathy. Reproductive:Proximal vas deferens calcification. Other: No ascites or pneumoperitoneum. Musculoskeletal: No acute abnormalities. IMPRESSION: Acute pancreatitis at the uncinate process. No necrosis or collection. Electronically Signed   By: Marnee SpringJonathon  Watts [name]   On: 01/22/2019 05:05    Assessment/Plan Principal Problem:   Acute pancreatitis Active Problems:   Dyslipidemia   Obstructive sleep apnea   PAF (paroxysmal atrial fibrillation) (HCC)   Diabetes mellitus (HCC)   Acute pancreatitis -RUQ US, check lipid panel  -IVF -Pain control, antiemetic  Paroxysmal A fib -CHADS2-VASc=2 (HTN, DM) -Currently in normal sinus rhythm -Flecainide -Toprol   -Xarelto    Chronic diastolic heart failure -EF 65 to 70% with grade 2 diastolic dysfunction on echocardiogram in 2014 -He does not appear volume overloaded at this time, has actually been dehydrated due to poor oral intake and nausea.  Continue IV fluids and watch fluid status closely  DM type II, non-insulin-dependent -Hold glipizide, metformin  -SSI every 4 while n.p.o.  HLD  -Lipitor   HTN -Lisinopril. Hold thiazide, possible drug-induced pancreatitis from thiazide?   OSA -CPAP qhs   Morbid obesity  Body mass index is 41.35 kg/m.   DVT prophylaxis: Xarelto Code Status: Full code Family Communication: No family at bedside Disposition Plan: Pending improvement in his pain, oral intake tolerance Consults called: None   Admission status: Inpatient  Severity of Illness: The appropriate patient status for this patient is INPATIENT. Inpatient status is judged to be reasonable and necessary in order to provide the required intensity of service to ensure the patient's safety. The patient's presenting symptoms, physical exam findings, and initial radiographic and laboratory data in the context of their chronic comorbidities is felt to place them at high risk for further clinical deterioration. Furthermore, it is not anticipated that the patient will be medically stable for discharge from the hospital within 2 midnights of admission. The following factors support the patient status of inpatient.   " The patient's presenting symptoms include abdominal pain, nausea, poor oral intake. " The worrisome physical exam findings include tender abdomen. " The initial radiographic and laboratory data are worrisome because of acute pancreatitis on CT. " The chronic co-morbidities include diabetes, paroxysmal atrial fibrillation, hyperlipidemia, hypertension.   * I certify that at the point of admission it is my clinical judgment that the patient will require inpatient hospital care spanning  beyond 2 midnights from the point of admission due to high intensity of service, high risk for further deterioration and high frequency of surveillance required.Noralee Stain, DO [facility] 01/22/2019, 7:22 AM    How to contact the [facility] Attending or Consulting provider 7A - 7P or covering provider during after hours 7P -7A, for this patient?  1. Check the care team in [facility] and look for a) attending/consulting [facility] provider listed and b) the [facility] team listed 2. Log into [email or web address] and use [place]'s universal password to access. If you do not have the password, please contact the hospital operator. 3. Locate the [facility] provider you are looking for under [facility] and page to a number that you can be directly reached. 4. If you still have difficulty reaching the provider, please page the [facility] (Director on Call) for the Hospitalists listed on amion for assistance.

## 2019-01-22 NOTE — ED Provider Notes (Signed)
[facility] EMERGENCY DEPARTMENT Provider Note   CSN: [id number] Arrival date & time: 01/22/19  0865    History   Chief Complaint Chief Complaint  Patient presents with   Abdominal Pain    HPI [name] is a 48 y.o. male.     Patient with history of diabetes, acid reflux, atrial fibrillation on Xarelto presenting with a 5-day history of lower abdominal pain.  Is progressively worsening.  He describes right-sided and periumbilical abdominal pain that is worse with palpation and movement.  He had 3-4 episodes of nonbloody nonbilious emesis today which he thinks is due to drinking milk which he does not normally do.  He did have a normal bowel movement today.  No blood in his stool.  He does not know when his last bowel movement was before today.  He reports he was here in the ED and had a similar pain in February which improved.  He did follow-up with his [facility] gastroenterologist no supposed to have a colonoscopy in March but this was canceled due to the coronavirus pandemic.  Reports having normal colonoscopies in the past.  He denies any fevers, chills, pain with urination, blood in the urine.  No testicular pain.  No chest pain or shortness of breath.  Previous appendectomy.  The history is provided by the patient.  Abdominal Pain  Associated symptoms: nausea and vomiting   Associated symptoms: no constipation, no cough, no diarrhea, no dysuria, no hematuria and no shortness of breath     Past Medical History:  Diagnosis Date   Diabetes mellitus    GERD (gastroesophageal reflux disease)    History of alcohol abuse    History of cocaine use    History of echocardiogram    a. Echo 4/14: Moderate LVH, vigorous LVEF, EF 65-70%, normal wall motion, grade 2 diastolic dysfunction, mildly dilated aortic root and ascending aorta, ascending aorta 40 mm, aortic root 38 mm, mild LAE   Hx of cardiovascular stress test    a. GXT 5/14: No ischemic changes   //  b. ETT-Myoview 3/16:  Low risk, no ischemia, EF 58%   Hyperlipidemia    Hypertension    Morbid obesity (HCC)    Obesity    Paroxysmal atrial fibrillation (HCC)    Occurring in 2008, with several recurrence since then (including in the setting of + cocaine on UDS).   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 Authorizing Provider  amphetamine-dextroamphetamine (ADDERALL XR) 30 MG 24 hr capsule Take 1 capsule (30 mg total) by mouth daily as needed (  For narcolepsy.). 04/15/18   Jetty DuhamelYoung, Clinton D, MD  atorvastatin (LIPITOR) 10 MG tablet Take 10 mg by mouth daily. 06/08/17   [provider]  cyclobenzaprine (FLEXERIL) 10 MG tablet Take 1 tablet (10 mg total) by mouth 2 (two) times daily as needed for muscle spasms. 12/27/18   Janne NapoleonNeese, Hope M, NP  flecainide (TAMBOCOR) 100 MG tablet Take 1 tablet (100 mg total) by mouth 2 (two) times daily. 01/08/18   Corky CraftsVaranasi, Jayadeep S, MD  glipiZIDE (GLUCOTROL XL) 10 MG 24 hr tablet TAKE 1 TABLET (10 MG TOTAL) BY MOUTH DAILY. 02/05/17   Veryl Speakalone, Gregory D, FNP  lisinopril-hydrochlorothiazide  (PRINZIDE,ZESTORETIC) 10-12.5 MG per tablet Take 1 tablet by mouth at bedtime.     [provider]  metoprolol succinate (TOPROL-XL) 25 MG 24 hr tablet Take 1 tablet (25 mg total) by mouth daily. 01/08/18   Corky CraftsVaranasi, Jayadeep S, MD  pravastatin (PRAVACHOL) 40 MG tablet Take 1 tablet (40 mg total) by mouth every evening. 04/10/16   Tereso NewcomerWeaver, Scott T, PA-C  rivaroxaban (XARELTO) 20 MG TABS tablet TAKE 1 TABLET (20 MG TOTAL) BY MOUTH DAILY WITH SUPPER. 01/15/19   Corky CraftsVaranasi, Jayadeep S, MD  traZODone (DESYREL) 50 MG tablet Take 1 tablet (50 mg total) by mouth at bedtime. 03/09/17   Waymon BudgeYoung, Clinton D, MD    Family History Family History  Problem Relation Age of Onset   Diabetes Mother    Heart attack Father [id number]   Stroke Maternal Grandmother    Stroke Paternal Grandmother    Diabetes Paternal Grandfather     Social History Social History   Tobacco Use   Smoking status: Current Every Day Smoker    Packs/day: 1.00    Years: 28.00    Pack years: 28.00    Last attempt to quit: 10/17/2007    Years since quitting: 11.2   Smokeless tobacco: Former NeurosurgeonUser    Types: Snuff   Tobacco comment: smokes one pack per day  Substance Use Topics   Alcohol use: Yes    Comment: occasionally - 1 beer 1x/mont   Drug use: No    Comment: cocaine in the past, none currently     Allergies   Shrimp [shellfish allergy]; Banana; Watermelon flavor; Other; Peanut-containing drug products; Almond oil; and Sulfa antibiotics   Review of Systems Review of Systems  Constitutional: Positive for activity change and appetite change.  HENT: Negative for congestion and rhinorrhea.   Respiratory: Negative for cough, chest tightness and shortness of breath.   Gastrointestinal: Positive for abdominal pain, nausea and vomiting. Negative for constipation and diarrhea.  Genitourinary: Negative for dysuria and hematuria.  Musculoskeletal: Negative for arthralgias and myalgias.  Neurological: Negative for dizziness,  weakness and headaches.    all other systems are negative except as noted in the HPI and PMH.    Physical Exam Updated Vital Signs BP 136/83 (BP Location: Right Arm)    Pulse 95    Temp 98.1 F (36.7 C) (Oral)    Resp 16    Ht 5\' 9"  (1.753 m)    Wt 127 kg    SpO2 97%    BMI 41.35 kg/m   Physical Exam Vitals signs and nursing note reviewed.  Constitutional:      General: He is not in acute distress.    Appearance: He is well-developed.  HENT:     Head: Normocephalic and atraumatic.     Mouth/Throat:     Pharynx: No oropharyngeal exudate.  Eyes:  Conjunctiva/sclera: Conjunctivae normal.     Pupils: Pupils are equal, round, and reactive to light.  Neck:     Musculoskeletal: Normal range of motion and neck supple.     Comments: No meningismus. Cardiovascular:     Rate and Rhythm: Normal rate. Rhythm irregular.     Heart sounds: Normal heart sounds. No murmur.  Pulmonary:     Effort: Pulmonary effort is normal. No respiratory distress.     Breath sounds: Normal breath sounds.  Abdominal:     Palpations: Abdomen is soft.     Tenderness: There is abdominal tenderness. There is no guarding or rebound.     Comments: Periumbilical pain, no guarding or rebound.  No RLQ pain. Epigastric pain to palpation.  Genitourinary:    Comments: No testicular pain. Musculoskeletal: Normal range of motion.        General: No tenderness.  Skin:    General: Skin is warm.     Capillary Refill: Capillary refill takes less than 2 seconds.  Neurological:     General: No focal deficit present.     Mental Status: He is alert and oriented to person, place, and time. Mental status is at baseline.     Cranial Nerves: No cranial nerve deficit.     Motor: No abnormal muscle tone.     Coordination: Coordination normal.     Comments: No ataxia on finger to nose bilaterally. No pronator drift. 5/5 strength throughout. CN 2-12 intact.Equal grip strength. Sensation intact.   Psychiatric:         Behavior: Behavior normal.      ED Treatments / Results  Labs (all labs ordered are listed, but only abnormal results are displayed) Labs Reviewed  CBC WITH DIFFERENTIAL/PLATELET - Abnormal; Notable for the following components:      Result Value   WBC 11.1 (*)    Neutro Abs 7.8 (*)    Monocytes Absolute 1.1 (*)    All other components within normal limits  COMPREHENSIVE METABOLIC PANEL - Abnormal; Notable for the following components:   Sodium 132 (*)    Glucose, Bld 236 (*)    BUN 25 (*)    Creatinine, Ser 1.45 (*)    GFR calc non Af Amer 57 (*)    All other components within normal limits  URINALYSIS, ROUTINE W REFLEX MICROSCOPIC - Abnormal; Notable for the following components:   Glucose, UA 150 (*)    Hgb urine dipstick MODERATE (*)    Ketones, ur 5 (*)    Protein, ur 100 (*)    All other components within normal limits  LACTIC ACID, PLASMA  LIPASE, BLOOD  LACTIC ACID, PLASMA    EKG None  Radiology Ct Abdomen Pelvis W Contrast  Result Date: 01/22/2019 CLINICAL DATA:  Worsening abdominal pain with nausea for 2 months EXAM: CT ABDOMEN AND PELVIS WITH CONTRAST TECHNIQUE: Multidetector CT imaging of the abdomen and pelvis was performed using the standard protocol following bolus administration of intravenous contrast. CONTRAST:  OMNIPAQUE IOHEXOL 300 MG/ML  SOLN COMPARISON:  11/30/2018 FINDINGS: Lower chest:  No contributory findings. Hepatobiliary: No focal liver abnormality.No evidence of biliary obstruction or stone. Pancreas: Expansion of the uncinate process with adjacent fat edema and mild nodal enlargement. The neighboring duodenum is non thickened. Spleen: Unremarkable. Adrenals/Urinary Tract: Negative adrenals. No hydronephrosis or stone. Unremarkable bladder. Stomach/Bowel:  No obstruction. Appendectomy Vascular/Lymphatic: No acute vascular abnormality. Mild atherosclerotic calcification. No mass or adenopathy. Reproductive:Proximal vas deferens calcification.  Other: No ascites or pneumoperitoneum.  Musculoskeletal: No acute abnormalities. IMPRESSION: Acute pancreatitis at the uncinate process. No necrosis or collection. Electronically Signed   By: Marnee Spring [name]   On: 01/22/2019 05:05    Procedures Procedures (including critical care time)  Medications Ordered in ED Medications  sodium chloride 0.9 % bolus 1,000 mL (has no administration in time range)     Initial Impression / Assessment and Plan / ED Course  I have reviewed the triage vital signs and the nursing notes.  Pertinent labs & imaging results that were available during my care of the patient were reviewed by me and considered in my medical decision making (see chart for details).       Recurrent lower abdominal pain after being diagnosed with colitis in February.  Reports several episodes of nausea and vomiting.  No diarrhea or fever. Abdomen soft without peritoneal signs.  Patient will be hydrated given symptomatic control.  Labs show mild leukocytosis of 11.  Creatinine at baseline.  Hyperglycemia without DKA.  CT scan shows evidence of pancreatitis without complication.  Previous colitis has resolved. Lipase is normal.  White blood cell count is normal.  No fever.  Uncertain cause of pancreatitis.  Patient denies any significant alcohol use and no history of gallstones.  Patient with persistent pain and nausea despite multiple doses of medications.  Not able to tolerate p.o.  Diagnosis of pancreatitis discussed with patient.  He does not feel like he will be able to go home.  Observation admission discussed with Dr. Alvino Chapel.   Final Clinical Impressions(s) / ED Diagnoses   Final diagnoses:  Periumbilical abdominal pain  Acute pancreatitis, unspecified complication status, unspecified pancreatitis type    ED Discharge Orders    None       Sammie Schermerhorn, Jeannett Senior, MD 01/22/19 [phone]

## 2019-01-22 NOTE — Progress Notes (Signed)
[name] [id number] Admission Data: 01/22/2019 12:04 PM Attending Provider: Noralee Stain, DO  OTL:XBWIOM, Windy Fast, MD Consults/ Treatment Team:   [name] Taulbee is a 48 y.o. male patient admitted from ED awake, alert  & orientated  X 3,  Full Code, VSS - Blood pressure (!) 176/104, pulse 82, temperature 97.9 F (36.6 C), temperature source Oral, resp. rate 18, height 5\' 9"  (1.753 m), weight 127 kg, SpO2 96 %., on RA, no c/o shortness of breath, no c/o chest pain, no distress noted.   Past Medical History:  Diagnosis Date  . Diabetes mellitus   . GERD (gastroesophageal reflux disease)   . History of alcohol abuse   . History of cocaine use   . History of echocardiogram    a. Echo 4/14: Moderate LVH, vigorous LVEF, EF 65-70%, normal wall motion, grade 2 diastolic dysfunction, mildly dilated aortic root and ascending aorta, ascending aorta 40 mm, aortic root 38 mm, mild LAE  . Hx of cardiovascular stress test    a. GXT 5/14: No ischemic changes  //  b. ETT-Myoview 3/16:  Low risk, no ischemia, EF 58%  . Hyperlipidemia   . Hypertension   . Morbid obesity (HCC)   . Obesity   . Paroxysmal atrial fibrillation (HCC)    Occurring in 2008, with several recurrence since then (including in the setting of + cocaine on UDS).  . Sleep apnea     Pt orientation to unit, room and routine. Information packet given to patient/family and safety video watched.  Admission INP armband ID verified with patient/family, and in place. SR up x 2, fall risk assessment complete with Patient and family verbalizing understanding of risks associated with falls. Pt verbalizes an understanding of how to use the call bell and to call for help before getting out of bed.  Skin, clean-dry- intact without evidence of bruising, or skin tears.     Will cont to monitor and assist as needed.  Lyndal Pulley, RN 01/22/2019 12:04 PM

## 2019-01-22 NOTE — ED Notes (Signed)
ED TO INPATIENT HANDOFF REPORT  ED Nurse Name and Phone #: Gabriel Rungaige Malikiah Debarr, RN [phone]  S Name/Age/Gender Douglas BatemanLeonidas [name] 48 y.o. male Room/Bed: 024C/024C  Code Status   Code Status: Prior  Home/SNF/Other Home Patient oriented to: self, place, time and situation Is this baseline? Yes   Triage Complete: Triage complete  Chief Complaint abdominal pain  Triage Note Abdomenal pain x 3 weeks.  Pt states the pain is worse today, "Stabbing pain 7 of 10 pain peri-umbilici   Allergies Allergies  Allergen Reactions  . Shrimp [Shellfish Allergy] Anaphylaxis  . Banana Other (See Comments)    Pt gags  . Watermelon Flavor Nausea And Vomiting  . Other Itching    Grapes  . Peanut-Containing Drug Products Itching and Cough  . Almond Oil Itching    Roof of mouth itches  . Sulfa Antibiotics Itching    Level of Care/Admitting Diagnosis ED Disposition    ED Disposition Condition Comment   Admit  [facility] [[id number][facility]  Level of Care: Med-Surg [16]  Diagnosis: Pancreatitis [[id number]]  Admitting Physician: Noralee StainHOI, JENNIFER [[id number]]  Attending Physician: Noralee StainHOI, JENNIFER [phone][[id number]]  Estimated length of stay: past midnight tomorrow  Certification:: I certify this patient will need inpatient services for at least 2 midnights  PT Class (Do Not Modify): Inpatient [101]  PT Acc Code (Do Not Modify): Private [1]       B Medical/Surgery History Past Medical History:  Diagnosis Date  . Diabetes mellitus   . GERD (gastroesophageal reflux disease)   . History of alcohol abuse   . History of cocaine use   . History of echocardiogram    a. Echo 4/14: Moderate LVH, vigorous LVEF, EF 65-70%, normal wall motion, grade 2 diastolic dysfunction, mildly dilated aortic root and ascending aorta, ascending aorta 40 mm, aortic root 38 mm, mild LAE  . Hx of cardiovascular stress test    a. GXT 5/14: No ischemic changes  //  b. ETT-Myoview 3/16:  Low risk, no ischemia, EF  58%  . Hyperlipidemia   . Hypertension   . Morbid obesity (HCC)   . Obesity   . Paroxysmal atrial fibrillation (HCC)    Occurring in 2008, with several recurrence since then (including in the setting of + cocaine on UDS).  . Sleep apnea    Past Surgical History:  Procedure Laterality Date  . APPENDECTOMY    . ROTATOR CUFF REPAIR    . TONSILLECTOMY       A IV Location/Drains/Wounds Patient Lines/Drains/Airways Status   Active Line/Drains/Airways    Name:   Placement date:   Placement time:   Site:   Days:   Peripheral IV 01/22/19 Left Hand   01/22/19    0156    Hand   less than 1          Intake/Output Last 24 hours  Intake/Output Summary (Last 24 hours) at 01/22/2019 0804 Last data filed at 01/22/2019 0441 Gross per 24 hour  Intake 1000 ml  Output -  Net 1000 ml    Labs/Imaging Results for orders placed or performed during the hospital encounter of 01/22/19 (from the past 48 hour(s))  Lactic acid, plasma     Status: None   Collection Time: 01/22/19  2:43 AM  Result Value Ref Range   Lactic Acid, Venous 1.1 0.5 - 1.9 mmol/L    Comment: Performed at [facility], [address] [place], [place] [id number]  CBC with Differential/Platelet  Status: Abnormal   Collection Time: 01/22/19  2:43 AM  Result Value Ref Range   WBC 11.1 (H) 4.0 - 10.5 K/uL   RBC 5.44 4.22 - 5.81 MIL/uL   Hemoglobin 14.9 13.0 - 17.0 g/dL   HCT 40.9 81.1 - 91.4 %   MCV 81.6 80.0 - 100.0 fL   MCH 27.4 26.0 - 34.0 pg   MCHC 33.6 30.0 - 36.0 g/dL   RDW 78.2 95.6 - 21.3 %   Platelets 342 150 - 400 K/uL   nRBC 0.0 0.0 - 0.2 %   Neutrophils Relative % 69 %   Neutro Abs 7.8 (H) 1.7 - 7.7 K/uL   Lymphocytes Relative 14 %   Lymphs Abs 1.6 0.7 - 4.0 K/uL   Monocytes Relative 10 %   Monocytes Absolute 1.1 (H) 0.1 - 1.0 K/uL   Eosinophils Relative 5 %   Eosinophils Absolute 0.5 0.0 - 0.5 K/uL   Basophils Relative 1 %   Basophils Absolute 0.1 0.0 - 0.1 K/uL   Immature Granulocytes 1 %    Abs Immature Granulocytes 0.05 0.00 - 0.07 K/uL    Comment: Performed at [facility], [address], [place], [place] [address]  Comprehensive metabolic panel     Status: Abnormal   Collection Time: 01/22/19  2:43 AM  Result Value Ref Range   Sodium 132 (L) 135 - 145 mmol/L   Potassium 4.2 3.5 - 5.1 mmol/L   Chloride 98 98 - 111 mmol/L   CO2 24 22 - 32 mmol/L   Glucose, Bld 236 (H) 70 - 99 mg/dL   BUN 25 (H) 6 - 20 mg/dL   Creatinine, Ser 8.46 (H) 0.61 - 1.24 mg/dL   Calcium 9.6 8.9 - 96.2 mg/dL   Total Protein 7.9 6.5 - 8.1 g/dL   Albumin 3.9 3.5 - 5.0 g/dL   AST 20 15 - 41 U/L   ALT 20 0 - 44 U/L   Alkaline Phosphatase 76 38 - 126 U/L   Total Bilirubin 0.9 0.3 - 1.2 mg/dL   GFR calc non Af Amer 57 (L) >60 mL/min   GFR calc Af Amer >60 >60 mL/min   Anion gap 10 5 - 15    Comment: Performed at [facility], [address], [place], [place] [address]  Lipase, blood     Status: None   Collection Time: 01/22/19  2:43 AM  Result Value Ref Range   Lipase 38 11 - 51 U/L    Comment: Performed at [facility], [address], [place], [place] [address]  Urinalysis, Routine w reflex microscopic     Status: Abnormal   Collection Time: 01/22/19  5:07 AM  Result Value Ref Range   Color, Urine YELLOW YELLOW   APPearance CLEAR CLEAR   Specific Gravity, Urine 1.012 1.005 - 1.030   pH 5.0 5.0 - 8.0   Glucose, UA 150 (A) NEGATIVE mg/dL   Hgb urine dipstick MODERATE (A) NEGATIVE   Bilirubin Urine NEGATIVE NEGATIVE   Ketones, ur 5 (A) NEGATIVE mg/dL   Protein, ur 010 (A) NEGATIVE mg/dL   Nitrite NEGATIVE NEGATIVE   Leukocytes,Ua NEGATIVE NEGATIVE   RBC / HPF 0-5 0 - 5 RBC/hpf   WBC, UA 0-5 0 - 5 WBC/hpf   Bacteria, UA NONE SEEN NONE SEEN   Mucus PRESENT    Hyaline Casts, UA PRESENT     Comment: Performed at [facility], [address], [place], [place] [address]  Ct Abdomen Pelvis W Contrast  Result Date: 01/22/2019 CLINICAL DATA:  Worsening abdominal pain  with nausea for 2 months EXAM: CT ABDOMEN AND PELVIS WITH CONTRAST TECHNIQUE: Multidetector CT imaging of the abdomen and pelvis was performed using the standard protocol following bolus administration of intravenous contrast. CONTRAST:  OMNIPAQUE IOHEXOL 300 MG/ML  SOLN COMPARISON:  11/30/2018 FINDINGS: Lower chest:  No contributory findings. Hepatobiliary: No focal liver abnormality.No evidence of biliary obstruction or stone. Pancreas: Expansion of the uncinate process with adjacent fat edema and mild nodal enlargement. The neighboring duodenum is non thickened. Spleen: Unremarkable. Adrenals/Urinary Tract: Negative adrenals. No hydronephrosis or stone. Unremarkable bladder. Stomach/Bowel:  No obstruction. Appendectomy Vascular/Lymphatic: No acute vascular abnormality. Mild atherosclerotic calcification. No mass or adenopathy. Reproductive:Proximal vas deferens calcification. Other: No ascites or pneumoperitoneum. Musculoskeletal: No acute abnormalities. IMPRESSION: Acute pancreatitis at the uncinate process. No necrosis or collection. Electronically Signed   By: Marnee Spring [name]   On: 01/22/2019 05:05    Pending Labs Unresulted Labs (From admission, onward)    Start     Ordered   01/22/19 0243  Lactic acid, plasma  Now then every 2 hours,   STAT     01/22/19 0242   Signed and Held  HIV antibody (Routine Testing)  Once,   R     Signed and Held   Signed and Held  CBC  Tomorrow morning,   R     Signed and Held   Signed and Held  Comprehensive metabolic panel  Tomorrow morning,   R     Signed and Held   Signed and Held  Lipid panel  Once,   R     Signed and Held          Vitals/Pain Today's Vitals   01/22/19 0229 01/22/19 0230 01/22/19 0754 01/22/19 0757  BP: 136/83  (!) 186/114   Pulse: 95  91   Resp: 16  18   Temp: 98.1 F (36.7 C)  98 F (36.7 C)   TempSrc: Oral  Oral   SpO2: 97%  98%   Weight:  127 kg    Height:  5\' 9"  (1.753 m)    PainSc:  7  7  7      Isolation  Precautions No active isolations  Medications Medications  sodium chloride 0.9 % bolus 1,000 mL (0 mLs Intravenous Stopped 01/22/19 0441)  ondansetron (ZOFRAN) injection 4 mg (4 mg Intravenous Given 01/22/19 0253)  fentaNYL (SUBLIMAZE) injection 50 mcg (50 mcg Intravenous Given 01/22/19 0253)  fentaNYL (SUBLIMAZE) injection 50 mcg (50 mcg Intravenous Given 01/22/19 0438)  iohexol (OMNIPAQUE) 300 MG/ML solution 125 mL (125 mLs Intravenous Contrast Given 01/22/19 0441)  sodium chloride 0.9 % bolus 1,000 mL (1,000 mLs Intravenous Bolus from Bag 01/22/19 0552)  HYDROmorphone (DILAUDID) injection 1 mg (1 mg Intravenous Given 01/22/19 0550)  promethazine (PHENERGAN) injection 12.5 mg (12.5 mg Intravenous Given 01/22/19 0550)    Mobility walks     Focused Assessment GI/GU (abd pain/pancreatitis)   R Recommendations: See Admitting Provider Note  Report given to: 5W  Additional Notes: Pt is currently feeling nauseated (with no food on his stomach), reports this has been going on since his stomach first started hurting weeks ago (per pt). Pt also reports pain 7/10 in his lower abd at this time.

## 2019-01-22 NOTE — ED Triage Notes (Signed)
Abdomenal pain x 3 weeks.  Pt states the pain is worse today, "Stabbing pain 7 of 10 pain peri-umbilici

## 2019-01-23 DIAGNOSIS — Z794 Long term (current) use of insulin: Secondary | ICD-10-CM

## 2019-01-23 DIAGNOSIS — E119 Type 2 diabetes mellitus without complications: Secondary | ICD-10-CM

## 2019-01-23 DIAGNOSIS — I480 Paroxysmal atrial fibrillation: Secondary | ICD-10-CM

## 2019-01-23 LAB — COMPREHENSIVE METABOLIC PANEL
ALT: 16 U/L (ref 0–44)
AST: 18 U/L (ref 15–41)
Albumin: 3.5 g/dL (ref 3.5–5.0)
Alkaline Phosphatase: 67 U/L (ref 38–126)
Anion gap: 9 (ref 5–15)
BUN: 13 mg/dL (ref 6–20)
CO2: 27 mmol/L (ref 22–32)
Calcium: 9.3 mg/dL (ref 8.9–10.3)
Chloride: 101 mmol/L (ref 98–111)
Creatinine, Ser: 1.2 mg/dL (ref 0.61–1.24)
GFR calc Af Amer: >60 mL/min (ref >60)
GFR calc non Af Amer: >60 mL/min (ref >60)
Glucose, Bld: 134 mg/dL — ABNORMAL HIGH (ref 70–99)
Potassium: 4.4 mmol/L (ref 3.5–5.1)
Sodium: 137 mmol/L (ref 135–145)
Total Bilirubin: 1.2 mg/dL (ref 0.3–1.2)
Total Protein: 6.9 g/dL (ref 6.5–8.1)

## 2019-01-23 LAB — CBC
HCT: 41 % (ref 39.0–52.0)
Hemoglobin: 13.9 g/dL (ref 13.0–17.0)
MCH: 27.9 pg (ref 26.0–34.0)
MCHC: 33.9 g/dL (ref 30.0–36.0)
MCV: 82.2 fL (ref 80.0–100.0)
Platelets: 323 10*3/uL (ref 150–400)
RBC: 4.99 MIL/uL (ref 4.22–5.81)
RDW: 12.6 % (ref 11.5–15.5)
WBC: 10.8 10*3/uL — ABNORMAL HIGH (ref 4.0–10.5)
nRBC: 0 % (ref 0.0–0.2)

## 2019-01-23 LAB — GLUCOSE, CAPILLARY
Glucose-Capillary: 123 mg/dL — ABNORMAL HIGH (ref 70–99)
Glucose-Capillary: 128 mg/dL — ABNORMAL HIGH (ref 70–99)
Glucose-Capillary: 169 mg/dL — ABNORMAL HIGH (ref 70–99)
Glucose-Capillary: 182 mg/dL — ABNORMAL HIGH (ref 70–99)
Glucose-Capillary: 142 mg/dL — ABNORMAL HIGH (ref 70–99)

## 2019-01-23 LAB — HIV ANTIBODY (ROUTINE TESTING W REFLEX): HIV Screen 4th Generation wRfx: NONREACTIVE

## 2019-01-23 MED ORDER — PANTOPRAZOLE SODIUM 40 MG PO TBEC
40.0000 mg | DELAYED_RELEASE_TABLET | Freq: Every day | ORAL | Status: DC
  Administered 2019-01-23 – 2019-01-24 (×2): 40 mg via ORAL
  Filled 2019-01-23 (×2): qty 1

## 2019-01-23 MED ORDER — HYDROCHLOROTHIAZIDE 12.5 MG PO CAPS
12.5000 mg | ORAL_CAPSULE | Freq: Every day | ORAL | Status: DC
  Administered 2019-01-23 – 2019-01-24 (×2): 12.5 mg via ORAL
  Filled 2019-01-23 (×2): qty 1

## 2019-01-23 NOTE — Discharge Instructions (Signed)
Acute Pancreatitis  The pancreas is a gland that is located behind the stomach on the left side of the abdomen. It produces enzymes that help to digest food. The pancreas also releases the hormones glucagon and insulin, which help to regulate blood sugar. Acute pancreatitis happens when inflammation of the pancreas suddenly occurs and the pancreas becomes irritated and swollen. Most acute attacks last a few days and cause serious problems. Some people become dehydrated and develop low blood pressure. In severe cases, bleeding in the abdomen can lead to shock and can be life-threatening. The lungs, heart, and kidneys may fail. What are the causes? This condition may be caused by:  Alcohol abuse.  Drug abuse.  Gallstones or other conditions that can block the tube that drains the pancreas (pancreatic duct).  A tumor in the pancreas. Other causes include:  Certain medicines.  Exposure to certain chemicals.  Diabetes.  An infection in the pancreas.  Damage caused by an accident (trauma).  The poison (venom) from a scorpion bite.  Abdominal surgery.  Autoimmune pancreatitis. This is when the body's disease-fighting (immune) system attacks the pancreas.  Genes that are passed from parent to child (inherited). In some cases, the cause of this condition is not known. What are the signs or symptoms? Symptoms of this condition include:  Pain in the upper abdomen that may radiate to the back. Pain may be severe.  Tenderness and swelling of the abdomen.  Nausea and vomiting.  Fever. How is this diagnosed? This condition may be diagnosed based on:  A physical exam.  Blood tests.  Imaging tests, such as X-rays, CT or MRI scans, or an ultrasound of the abdomen. How is this treated? Treatment for this condition usually requires a stay in the hospital. Treatment for this condition may include:  Pain medicine.  Fluid replacement through an IV.  Placing a tube in the  stomach to remove stomach contents and to control vomiting (NG tube, or nasogastric tube).  Not eating for 3-4 days. This gives the pancreas a rest, because enzymes are not being produced that can cause further damage.  Antibiotic medicines, if your condition is caused by an infection.  Treating any underlying conditions that may be the cause.  Steroid medicines, if your condition is caused by your immune system attacking your body's own tissues (autoimmune disease).  Surgery on the pancreas or gallbladder. Follow these instructions at home: Eating and drinking   Follow instructions from your health care provider about diet. This may involve avoiding alcohol and decreasing the amount of fat in your diet.  Eat smaller, more frequent meals. This reduces the amount of digestive fluids that the pancreas produces.  Drink enough fluid to keep your urine pale yellow.  Do not drink alcohol if it caused your condition. General instructions  Take over-the-counter and prescription medicines only as told by your health care provider.  Do not drive or use heavy machinery while taking prescription pain medicine.  Ask your health care provider if the medicine prescribed to you can cause constipation. You may need to take steps to prevent or treat constipation, such as: ? Take an over-the-counter or prescription medicine for constipation. ? Eat foods that are high in fiber such as whole grains and beans. ? Limit foods that are high in fat and processed sugars, such as fried or sweet foods.  Do not use any products that contain nicotine or tobacco, such as cigarettes, e-cigarettes, and chewing tobacco. If you need help  quitting, ask your health care provider.  Get plenty of rest.  If directed, check your blood sugar at home as told by your health care provider.  Keep all follow-up visits as told by your health care provider. This is important. Contact a health care provider if you:  Do not  recover as quickly as expected.  Develop new or worsening symptoms.  Have persistent pain, weakness, or nausea.  Recover and then have another episode of pain.  Have a fever. Get help right away if:  You cannot eat or keep fluids down.  Your pain becomes severe.  Your skin or the white part of your eyes turns yellow (jaundice).  You have sudden swelling in your abdomen.  You vomit.  You feel dizzy or you faint.  Your blood sugar is high (over 300 mg/dL). Summary  Acute pancreatitis happens when inflammation of the pancreas suddenly occurs and the pancreas becomes irritated and swollen.  This condition is typically caused by alcohol abuse, drug abuse, or gallstones.  Treatment for this condition usually requires a stay in the hospital. This information is not intended to replace advice given to you by your health care provider. Make sure you discuss any questions you have with your health care provider. Document Released: 10/02/2005 Document Revised: 04/08/2018 Document Reviewed: 04/08/2018 Elsevier Interactive Patient Education  2019 [employer]   Acute Pancreatitis  Acute pancreatitis happens when the pancreas gets swollen. The pancreas is a large gland behind the stomach. The pancreas helps control blood sugar. It also makes enzymes that help digest food. This condition happens when the enzymes attack the pancreas and damage it. Most attacks last a couple of days and are dangerous. The lungs, heart, and kidneys may stop working. What are the causes?  Alcohol abuse.  Drug abuse.  Gallstones.  Some medicines.  Some chemicals.  Infection.  Damage caused by an accident.Irven Shelling (abdominal) surgery.  In some cases, the cause is not known. What are the signs or symptoms?  Pain in the upper belly and back.  Swelling of the belly  Feeling sick to your stomach (nausea) and throwing up (vomiting). How is this treated?  You will probably have to stay in  the hospital. ? Treatment may include:  Fluid through an IV.  A tube to remove stomach contents and stop you from throwing up.  Not eating for 3-4 days.  Pain medicine.  Antibiotic medicines if you have an infection.  Surgery on the pancreas or gallbladder. Follow these instructions at home: Eating and drinking   Follow instructions from your doctor about diet.  Eat small meals often. Avoid eating big meals.  Eat foods that do not have a lot of fat in them.  Drink enough fluid to keep your pee (urine) pale yellow.  Do not drink alcohol if it caused your condition. General instructions  Take over-the-counter and prescription medicines only as told by your doctor.  Do not use cigarettes, e-cigarettes, and chewing tobacco. If you need help quitting, ask your doctor.  Get plenty of rest.  If directed, check your blood sugar at home as told by your doctor.  Keep all follow-up visits as told by your doctor. This is important. Contact a doctor if:  You do not get better as quickly as expected.  You have new symptoms.  Your symptoms get worse.  You have lasting pain or weakness.  You continue to feel sick to your stomach.  You get better and then you have another  pain attack.  You have a fever. Get help right away if:  You cannot eat or keep fluids down.  Your pain becomes very bad.  Your skin or the white part of your eyes turns yellow.  You throw up.  You feel dizzy or you pass out.  Your blood sugar is high (over 300 mg/dL). Summary  Acute pancreatitis happens when the pancreas gets swollen.  This condition is usually caused by alcohol abuse, drug abuse, or gallstones.  You will probably have to stay in the hospital for treatment. This information is not intended to replace advice given to you by your health care provider. Make sure you discuss any questions you have with your health care provider. Document Released: 03/20/2008 Document Revised:  02/05/2017 Document Reviewed: 07/06/2015 Elsevier Interactive Patient Education  2019 [employer] Acute Pancreatitis  The pancreas is a gland that is located behind the stomach on the left side of the abdomen. It produces enzymes that help to digest food. The pancreas also releases the hormones glucagon and insulin, which help to regulate blood sugar. Acute pancreatitis happens when inflammation of the pancreas suddenly occurs and the pancreas becomes irritated and swollen. Most acute attacks last a few days and cause serious problems. Some people become dehydrated and develop low blood pressure. In severe cases, bleeding in the abdomen can lead to shock and can be life-threatening. The lungs, heart, and kidneys may fail. What are the causes? This condition may be caused by:  Alcohol abuse.  Drug abuse.  Gallstones or other conditions that can block the tube that drains the pancreas (pancreatic duct).  A tumor in the pancreas. Other causes include:  Certain medicines.  Exposure to certain chemicals.  Diabetes.  An infection in the pancreas.  Damage caused by an accident (trauma).  The poison (venom) from a scorpion bite.  Abdominal surgery.  Autoimmune pancreatitis. This is when the body's disease-fighting (immune) system attacks the pancreas.  Genes that are passed from parent to child (inherited). In some cases, the cause of this condition is not known. What are the signs or symptoms? Symptoms of this condition include:  Pain in the upper abdomen that may radiate to the back. Pain may be severe.  Tenderness and swelling of the abdomen.  Nausea and vomiting.  Fever. How is this diagnosed? This condition may be diagnosed based on:  A physical exam.  Blood tests.  Imaging tests, such as X-rays, CT or MRI scans, or an ultrasound of the abdomen. How is this treated? Treatment for this condition usually requires a stay in the hospital. Treatment for this  condition may include:  Pain medicine.  Fluid replacement through an IV.  Placing a tube in the stomach to remove stomach contents and to control vomiting (NG tube, or nasogastric tube).  Not eating for 3-4 days. This gives the pancreas a rest, because enzymes are not being produced that can cause further damage.  Antibiotic medicines, if your condition is caused by an infection.  Treating any underlying conditions that may be the cause.  Steroid medicines, if your condition is caused by your immune system attacking your body's own tissues (autoimmune disease).  Surgery on the pancreas or gallbladder. Follow these instructions at home: Eating and drinking   Follow instructions from your health care provider about diet. This may involve avoiding alcohol and decreasing the amount of fat in your diet.  Eat smaller, more frequent meals. This reduces the amount of digestive fluids that the pancreas produces.  Drink enough fluid to keep your urine pale yellow.  Do not drink alcohol if it caused your condition. General instructions  Take over-the-counter and prescription medicines only as told by your health care provider.  Do not drive or use heavy machinery while taking prescription pain medicine.  Ask your health care provider if the medicine prescribed to you can cause constipation. You may need to take steps to prevent or treat constipation, such as: ? Take an over-the-counter or prescription medicine for constipation. ? Eat foods that are high in fiber such as whole grains and beans. ? Limit foods that are high in fat and processed sugars, such as fried or sweet foods.  Do not use any products that contain nicotine or tobacco, such as cigarettes, e-cigarettes, and chewing tobacco. If you need help quitting, ask your health care provider.  Get plenty of rest.  If directed, check your blood sugar at home as told by your health care provider.  Keep all follow-up visits as  told by your health care provider. This is important. Contact a health care provider if you:  Do not recover as quickly as expected.  Develop new or worsening symptoms.  Have persistent pain, weakness, or nausea.  Recover and then have another episode of pain.  Have a fever. Get help right away if:  You cannot eat or keep fluids down.  Your pain becomes severe.  Your skin or the white part of your eyes turns yellow (jaundice).  You have sudden swelling in your abdomen.  You vomit.  You feel dizzy or you faint.  Your blood sugar is high (over 300 mg/dL). Summary  Acute pancreatitis happens when inflammation of the pancreas suddenly occurs and the pancreas becomes irritated and swollen.  This condition is typically caused by alcohol abuse, drug abuse, or gallstones.  Treatment for this condition usually requires a stay in the hospital. This information is not intended to replace advice given to you by your health care provider. Make sure you discuss any questions you have with your health care provider. Document Released: 10/02/2005 Document Revised: 04/08/2018 Document Reviewed: 04/08/2018 Elsevier Interactive Patient Education  2019 [employer] Information on my medicine - XARELTO (Rivaroxaban)  Why was Xarelto prescribed for you? Xarelto was prescribed for you to reduce the risk of a blood clot forming that can cause a stroke if you have a medical condition called atrial fibrillation (a type of irregular heartbeat).  What do you need to know about xarelto ? Take your Xarelto ONCE DAILY at the same time every day with your evening meal. If you have difficulty swallowing the tablet whole, you may crush it and mix in applesauce just prior to taking your dose.  Take Xarelto exactly as prescribed by your doctor and DO NOT stop taking Xarelto without talking to the doctor who prescribed the medication.  Stopping without other stroke prevention medication to take the  place of Xarelto may increase your risk of developing a clot that causes a stroke.  Refill your prescription before you run out.  After discharge, you should have regular check-up appointments with your healthcare provider that is prescribing your Xarelto.  In the future your dose may need to be changed if your kidney function or weight changes by a significant amount.  What do you do if you miss a dose? If you are taking Xarelto ONCE DAILY and you miss a dose, take it as soon as you remember on the same day then continue your regularly scheduled  once daily regimen the next day. Do not take two doses of Xarelto at the same time or on the same day.   Important Safety Information A possible side effect of Xarelto is bleeding. You should call your healthcare provider right away if you experience any of the following: ? Bleeding from an injury or your nose that does not stop. ? Unusual colored urine (red or dark brown) or unusual colored stools (red or black). ? Unusual bruising for unknown reasons. ? A serious fall or if you hit your head (even if there is no bleeding).  Some medicines may interact with Xarelto and might increase your risk of bleeding while on Xarelto. To help avoid this, consult your healthcare provider or pharmacist prior to using any new prescription or non-prescription medications, including herbals, vitamins, non-steroidal anti-inflammatory drugs (NSAIDs) and supplements.  This website has more information on Xarelto: VisitDestination.com.br.

## 2019-01-23 NOTE — Progress Notes (Signed)
Pt refusing CPAP for tonight 

## 2019-01-23 NOTE — Progress Notes (Signed)
Patient ID: [name], male   DOB: July 14, 1971, 48 y.o.   MRN: [id number]  PROGRESS NOTE    [name]  [id number] DOB: July 14, 1971 DOA: 01/22/2019 PCP: Renford DillsPolite, Ronald, MD   Brief Narrative:  48 year old male with history of paroxysmal A. fib on Xarelto, diabetes mellitus, hypertension, hyperlipidemia, morbid obesity presented with abdominal pain along with nausea without vomiting.  CT abdomen did reveal acute pancreatitis on the lipase were normal.  He was started on IV fluids and analgesics.  Assessment & Plan:   Principal Problem:   Acute pancreatitis Active Problems:   Dyslipidemia   Obstructive sleep apnea   PAF (paroxysmal atrial fibrillation) (HCC)   Diabetes mellitus (HCC)  Acute pancreatitis -Questionable cause.  Triglycerides normal.  No gallstones seen on right upper quadrant ultrasound -Improving.  Decrease IV fluids to 125 cc an hour.  Start clear liquid diet and advance as tolerated.  Continue IV Dilaudid for severe pain, currently still requiring IV Dilaudid. -We will also start oral Protonix  Paroxysmal A. fib -Currently rate controlled.  Continue flecainide, Toprol and Xarelto  Chronic diastolic heart failure -Currently compensated.  Echo in 2014 had shown EF of 65 to 70% with grade 2 diastolic dysfunction.  Continue Toprol.  Outpatient follow-up with cardiology  Diabetes mellitus type 2 -Non-insulin-dependent -Glipizide and metformin on hold.  Continue CBGs with SSI.  Hyperlipidemia -Continue Lipitor  OSA -CPAP nightly  Morbid obesity -Outpatient follow-up   DVT prophylaxis: Xarelto Code Status: Full  family Communication: None at bedside Disposition Plan: Home in 1 to 2 days once clinically improved  Consultants: None  Procedures: None  Antimicrobials:  None  Subjective: Patient seen and examined at bedside.  He still has intermittent abdominal pain.  No overnight fever or vomiting.  Still intermittently  nauseous.  Objective: Vitals:   01/22/19 1551 01/22/19 2032 01/23/19 0508 01/23/19 0509  BP: (!) 152/88 (!) 169/102 (!) 164/101 (!) 147/100  Pulse: 74 79 80 79  Resp:  18 18   Temp:  97.7 F (36.5 C) 97.8 F (36.6 C)   TempSrc:  Oral Oral   SpO2: 97% 97% 98%   Weight:      Height:        Intake/Output Summary (Last 24 hours) at 01/23/2019 [id number] Last data filed at 01/23/2019 [id number] Gross per 24 hour  Intake 2921.56 ml  Output 2200 ml  Net 721.56 ml   Filed Weights   01/22/19 0230  Weight: 127 kg    Examination:  General exam: Appears calm and comfortable  Respiratory system: Bilateral decreased breath sounds at bases Cardiovascular system: S1 & S2 heard, Rate controlled Gastrointestinal system: Abdomen is morbidly obese, nondistended, soft and mildly tender in the periumbilical and epigastric regions.  Normal bowel sounds heard. Extremities: No cyanosis, clubbing; trace edema   Data Reviewed: I have personally reviewed following labs and imaging studies  CBC: Recent Labs  Lab 01/22/19 0243 01/23/19 0258  WBC 11.1* 10.8*  NEUTROABS 7.8*  --   HGB 14.9 13.9  HCT 44.4 41.0  MCV 81.6 82.2  PLT 342 323   Basic Metabolic Panel: Recent Labs  Lab 01/22/19 0243 01/23/19 0258  NA 132* 137  K 4.2 4.4  CL 98 101  CO2 24 27  GLUCOSE 236* 134*  BUN 25* 13  CREATININE 1.45* 1.20  CALCIUM 9.6 9.3   GFR: Estimated Creatinine Clearance: 99.2 mL/min (by C-G formula based on SCr of 1.2 mg/dL). Liver Function Tests: Recent Labs  Lab 01/22/19 0243  01/23/19 0258  AST 20 18  ALT 20 16  ALKPHOS 76 67  BILITOT 0.9 1.2  PROT 7.9 6.9  ALBUMIN 3.9 3.5   Recent Labs  Lab 01/22/19 0243  LIPASE 38   No results for input(s): AMMONIA in the last 168 hours. Coagulation Profile: No results for input(s): INR, PROTIME in the last 168 hours. Cardiac Enzymes: No results for input(s): CKTOTAL, CKMB, CKMBINDEX, TROPONINI in the last 168 hours. BNP (last 3 results) No results  for input(s): PROBNP in the last 8760 hours. HbA1C: No results for input(s): HGBA1C in the last 72 hours. CBG: Recent Labs  Lab 01/22/19 1615 01/22/19 2027 01/22/19 2357 01/23/19 0511 01/23/19 0814  GLUCAP 133* 119* 121* 123* 128*   Lipid Profile: Recent Labs    01/22/19 0921  CHOL 137  HDL 29*  LDLCALC 81  TRIG 315  CHOLHDL 4.7   Thyroid Function Tests: No results for input(s): TSH, T4TOTAL, FREET4, T3FREE, THYROIDAB in the last 72 hours. Anemia Panel: No results for input(s): VITAMINB12, FOLATE, FERRITIN, TIBC, IRON, RETICCTPCT in the last 72 hours. Sepsis Labs: Recent Labs  Lab 01/22/19 0243  LATICACIDVEN 1.1    No results found for this or any previous visit (from the past 240 hour(s)).       Radiology Studies: Ct Abdomen Pelvis W Contrast  Result Date: 01/22/2019 CLINICAL DATA:  Worsening abdominal pain with nausea for 2 months EXAM: CT ABDOMEN AND PELVIS WITH CONTRAST TECHNIQUE: Multidetector CT imaging of the abdomen and pelvis was performed using the standard protocol following bolus administration of intravenous contrast. CONTRAST:  OMNIPAQUE IOHEXOL 300 MG/ML  SOLN COMPARISON:  11/30/2018 FINDINGS: Lower chest:  No contributory findings. Hepatobiliary: No focal liver abnormality.No evidence of biliary obstruction or stone. Pancreas: Expansion of the uncinate process with adjacent fat edema and mild nodal enlargement. The neighboring duodenum is non thickened. Spleen: Unremarkable. Adrenals/Urinary Tract: Negative adrenals. No hydronephrosis or stone. Unremarkable bladder. Stomach/Bowel:  No obstruction. Appendectomy Vascular/Lymphatic: No acute vascular abnormality. Mild atherosclerotic calcification. No mass or adenopathy. Reproductive:Proximal vas deferens calcification. Other: No ascites or pneumoperitoneum. Musculoskeletal: No acute abnormalities. IMPRESSION: Acute pancreatitis at the uncinate process. No necrosis or collection. Electronically Signed    By: Marnee Spring [name]   On: 01/22/2019 05:05   US Abdomen Limited Ruq  Result Date: 01/22/2019 CLINICAL DATA:  Pancreatitis EXAM: ULTRASOUND ABDOMEN LIMITED RIGHT UPPER QUADRANT COMPARISON:  01/22/2019 FINDINGS: Gallbladder: No gallstones or wall thickening visualized. No sonographic Murphy sign noted by sonographer. Common bile duct: Diameter: Normal caliber, 6 mm Liver: No focal lesion identified. Within normal limits in parenchymal echogenicity. Portal vein is patent on color Doppler imaging with normal direction of blood flow towards the liver. IMPRESSION: Normal right upper quadrant ultrasound. Electronically Signed   By: Charlett Nose [name]   On: 01/22/2019 10:29        Scheduled Meds:  atorvastatin  10 mg Oral Daily   flecainide  100 mg Oral BID   insulin aspart  0-9 Units Subcutaneous Q4H   lisinopril  10 mg Oral QHS   metoprolol succinate  25 mg Oral Daily   rivaroxaban  20 mg Oral Q supper   Continuous Infusions:  sodium chloride 150 mL/hr at 01/23/19 0445     LOS: 1 day        Glade Lloyd, MD [facility] 01/23/2019, 9:38 AM

## 2019-01-24 LAB — CBC WITH DIFFERENTIAL/PLATELET
Abs Immature Granulocytes: 0.03 10*3/uL (ref 0.00–0.07)
Basophils Absolute: 0 10*3/uL (ref 0.0–0.1)
Basophils Relative: 1 %
Eosinophils Absolute: 0.7 10*3/uL — ABNORMAL HIGH (ref 0.0–0.5)
Eosinophils Relative: 9 %
HCT: 39.4 % (ref 39.0–52.0)
Hemoglobin: 13.2 g/dL (ref 13.0–17.0)
Immature Granulocytes: 0 %
Lymphocytes Relative: 22 %
Lymphs Abs: 1.7 10*3/uL (ref 0.7–4.0)
MCH: 27.4 pg (ref 26.0–34.0)
MCHC: 33.5 g/dL (ref 30.0–36.0)
MCV: 81.9 fL (ref 80.0–100.0)
Monocytes Absolute: 0.9 10*3/uL (ref 0.1–1.0)
Monocytes Relative: 11 %
Neutro Abs: 4.5 10*3/uL (ref 1.7–7.7)
Neutrophils Relative %: 57 %
Platelets: 309 10*3/uL (ref 150–400)
RBC: 4.81 MIL/uL (ref 4.22–5.81)
RDW: 12.5 % (ref 11.5–15.5)
WBC: 7.9 10*3/uL (ref 4.0–10.5)
nRBC: 0 % (ref 0.0–0.2)

## 2019-01-24 LAB — COMPREHENSIVE METABOLIC PANEL
ALT: 13 U/L (ref 0–44)
AST: 18 U/L (ref 15–41)
Albumin: 3.4 g/dL — ABNORMAL LOW (ref 3.5–5.0)
Alkaline Phosphatase: 64 U/L (ref 38–126)
Anion gap: 10 (ref 5–15)
BUN: 10 mg/dL (ref 6–20)
CO2: 28 mmol/L (ref 22–32)
Calcium: 9.3 mg/dL (ref 8.9–10.3)
Chloride: 99 mmol/L (ref 98–111)
Creatinine, Ser: 0.95 mg/dL (ref 0.61–1.24)
GFR calc Af Amer: >60 mL/min (ref >60)
GFR calc non Af Amer: >60 mL/min (ref >60)
Glucose, Bld: 127 mg/dL — ABNORMAL HIGH (ref 70–99)
Potassium: 4.2 mmol/L (ref 3.5–5.1)
Sodium: 137 mmol/L (ref 135–145)
Total Bilirubin: 0.7 mg/dL (ref 0.3–1.2)
Total Protein: 7.1 g/dL (ref 6.5–8.1)

## 2019-01-24 LAB — GLUCOSE, CAPILLARY
Glucose-Capillary: 126 mg/dL — ABNORMAL HIGH (ref 70–99)
Glucose-Capillary: 143 mg/dL — ABNORMAL HIGH (ref 70–99)
Glucose-Capillary: 160 mg/dL — ABNORMAL HIGH (ref 70–99)

## 2019-01-24 LAB — MAGNESIUM: Magnesium: 1.6 mg/dL — ABNORMAL LOW (ref 1.7–2.4)

## 2019-01-24 MED ORDER — MAGNESIUM SULFATE 2 GM/50ML IV SOLN
2.0000 g | Freq: Once | INTRAVENOUS | Status: AC
  Administered 2019-01-24: 2 g via INTRAVENOUS
  Filled 2019-01-24: qty 50

## 2019-01-24 MED ORDER — OXYCODONE HCL 5 MG PO TABS: 5.0000 mg | ORAL_TABLET | Freq: Four times a day (QID) | ORAL | 0 refills | Status: DC | PRN

## 2019-01-24 MED ORDER — PANTOPRAZOLE SODIUM 40 MG PO TBEC: 40.0000 mg | DELAYED_RELEASE_TABLET | Freq: Every day | ORAL | 0 refills | Status: DC

## 2019-01-24 MED ORDER — ONDANSETRON HCL 4 MG PO TABS: 4.0000 mg | ORAL_TABLET | Freq: Four times a day (QID) | ORAL | 0 refills | Status: DC | PRN

## 2019-01-24 MED ORDER — LISINOPRIL 20 MG PO TABS: 20.0000 mg | ORAL_TABLET | Freq: Every day | ORAL | 0 refills | Status: DC

## 2019-01-24 NOTE — Discharge Summary (Signed)
Physician Discharge Summary  [name] HCW:[id number] DOB: Dec 06, 1970 DOA: 01/22/2019  PCP: Renford Dills, MD  Admit date: 01/22/2019 Discharge date: 01/24/2019  Admitted From: Home Disposition: Home  Recommendations for Outpatient Follow-up:  1. Follow up with PCP in 1 week with repeat CBC/BMP 2. Follow up in ED if symptoms worsen or new appear   Home Health: No Equipment/Devices: None  Discharge Condition: Stable CODE STATUS: Full Diet recommendation: Heart healthy/carb modified  Brief/Interim Summary: 48 year old male with history of paroxysmal A. fib on Xarelto, diabetes mellitus, hypertension, hyperlipidemia, morbid obesity presented with abdominal pain with nausea.  CT abdomen pelvis did reveal acute pancreatitis.  He was treated with IV fluids and pain management.  He was initially kept n.p.o. but diet was gradually advanced.  He is tolerating diet.  His symptoms have improved.  Hydrochlorothiazide will be held on discharge.  He will be discharged home today.  Discharge Diagnoses:  Principal Problem:   Acute pancreatitis Active Problems:   Dyslipidemia   Obstructive sleep apnea   PAF (paroxysmal atrial fibrillation) (HCC)   Diabetes mellitus (HCC)  Acute pancreatitis -Questionable cause.  Triglycerides normal.  No gallstones seen on right upper quadrant ultrasound -Improving.  Treated with IV fluids and analgesics.  Initially was kept n.p.o. but diet was started and advanced.  He is tolerating diet.  -Protonix was also started which will be continued.  He will be discharged home today.  Paroxysmal A. fib -Currently rate controlled.  Continue flecainide, Toprol and Xarelto  Chronic diastolic heart failure -Currently compensated.  Echo in 2014 had shown EF of 65 to 70% with grade 2 diastolic dysfunction.  Continue Toprol.  Outpatient follow-up with cardiology  Diabetes mellitus type 2 -Non-insulin-dependent -Glipizide and metformin will be resumed on  discharge.  Hypertension--hydrochlorothiazide will be held on discharge:?  Might be the cause for pancreatitis.  Increase lisinopril to 20 mg daily.  Continue Toprol.  Hyperlipidemia -Continue Lipitor  OSA -CPAP nightly  Morbid obesity -Outpatient follow-up  Discharge Instructions  Discharge Instructions    Diet - low sodium heart healthy   Complete by:  As directed    Diet Carb Modified   Complete by:  As directed    Increase activity slowly   Complete by:  As directed      Allergies as of 01/24/2019      Reactions   Shrimp [shellfish Allergy] Anaphylaxis   Banana Other (See Comments)   Pt gags   Watermelon Flavor Nausea And Vomiting   Other Itching   Grapes   Peanut-containing Drug Products Itching, Cough   Almond Oil Itching   Roof of mouth itches   Sulfa Antibiotics Itching      Medication List    STOP taking these medications   lisinopril-hydrochlorothiazide 10-12.5 MG tablet Commonly known as:  PRINZIDE,ZESTORETIC     TAKE these medications   atorvastatin 10 MG tablet Commonly known as:  LIPITOR Take 10 mg by mouth daily.   flecainide 100 MG tablet Commonly known as:  TAMBOCOR Take 1 tablet (100 mg total) by mouth 2 (two) times daily.   glipiZIDE 10 MG tablet Commonly known as:  GLUCOTROL Take 10 mg by mouth 2 (two) times daily.   lisinopril 20 MG tablet Commonly known as:  PRINIVIL,ZESTRIL Take 1 tablet (20 mg total) by mouth daily for 30 days.   metFORMIN 500 MG tablet Commonly known as:  GLUCOPHAGE Take 1,000 mg by mouth 2 (two) times daily.   metoprolol succinate 25 MG 24 hr tablet  Commonly known as:  TOPROL-XL Take 1 tablet (25 mg total) by mouth daily.   ondansetron 4 MG tablet Commonly known as:  ZOFRAN Take 1 tablet (4 mg total) by mouth every 6 (six) hours as needed for nausea.   oxyCODONE 5 MG immediate release tablet Commonly known as:  Oxy IR/ROXICODONE Take 1 tablet (5 mg total) by mouth every 6 (six) hours as needed for  moderate pain.   pantoprazole 40 MG tablet Commonly known as:  PROTONIX Take 1 tablet (40 mg total) by mouth daily.   rivaroxaban 20 MG Tabs tablet Commonly known as:  Xarelto TAKE 1 TABLET (20 MG TOTAL) BY MOUTH DAILY WITH SUPPER.      Follow-up Information    Renford DillsPolite, Ronald, MD. Schedule an appointment as soon as possible for a visit in 1 week(s).   Specialty:  Internal Medicine Contact information: [address] [place] [address] [phone]        Corky CraftsVaranasi, Jayadeep S, MD .   Specialties:  Cardiology, Radiology, Interventional Cardiology Contact information: [address] [place] [address] [phone]          Allergies  Allergen Reactions  . Shrimp [Shellfish Allergy] Anaphylaxis  . Banana Other (See Comments)    Pt gags  . Watermelon Flavor Nausea And Vomiting  . Other Itching    Grapes  . Peanut-Containing Drug Products Itching and Cough  . Almond Oil Itching    Roof of mouth itches  . Sulfa Antibiotics Itching    Consultations:  None   Procedures/Studies: Dg Lumbar Spine Complete  Result Date: 12/27/2018 CLINICAL DATA:  Fall EXAM: LUMBAR SPINE - COMPLETE 4+ VIEW COMPARISON:  None. FINDINGS: There is no evidence of lumbar spine fracture. Alignment is normal. Intervertebral disc spaces are maintained. IMPRESSION: Negative. Electronically Signed   By: Deatra RobinsonKevin  Herman [name]   On: 12/27/2018 21:35   Ct Abdomen Pelvis W Contrast  Result Date: 01/22/2019 CLINICAL DATA:  Worsening abdominal pain with nausea for 2 months EXAM: CT ABDOMEN AND PELVIS WITH CONTRAST TECHNIQUE: Multidetector CT imaging of the abdomen and pelvis was performed using the standard protocol following bolus administration of intravenous contrast. CONTRAST:  125mL OMNIPAQUE IOHEXOL 300 MG/ML  SOLN COMPARISON:  11/30/2018 FINDINGS: Lower chest:  No contributory findings. Hepatobiliary: No focal liver abnormality.No evidence of biliary obstruction  or stone. Pancreas: Expansion of the uncinate process with adjacent fat edema and mild nodal enlargement. The neighboring duodenum is non thickened. Spleen: Unremarkable. Adrenals/Urinary Tract: Negative adrenals. No hydronephrosis or stone. Unremarkable bladder. Stomach/Bowel:  No obstruction. Appendectomy Vascular/Lymphatic: No acute vascular abnormality. Mild atherosclerotic calcification. No mass or adenopathy. Reproductive:Proximal vas deferens calcification. Other: No ascites or pneumoperitoneum. Musculoskeletal: No acute abnormalities. IMPRESSION: Acute pancreatitis at the uncinate process. No necrosis or collection. Electronically Signed   By: Marnee SpringJonathon  Watts [name]   On: 01/22/2019 05:05   [place] Abdomen Limited Ruq  Result Date: 01/22/2019 CLINICAL DATA:  Pancreatitis EXAM: ULTRASOUND ABDOMEN LIMITED RIGHT UPPER QUADRANT COMPARISON:  01/22/2019 FINDINGS: Gallbladder: No gallstones or wall thickening visualized. No sonographic Murphy sign noted by sonographer. Common bile duct: Diameter: Normal caliber, 6 mm Liver: No focal lesion identified. Within normal limits in parenchymal echogenicity. Portal vein is patent on color Doppler imaging with normal direction of blood flow towards the liver. IMPRESSION: Normal right upper quadrant ultrasound. Electronically Signed   By: Charlett NoseKevin  Dover [name]   On: 01/22/2019 10:29       Subjective: Patient seen and examined  at bedside.  He feels much better and only complains of very mild intermittent abdominal pain.  He is tolerating diet and thinks that he is ready to go home.  Discharge Exam: Vitals:   01/24/19 0558 01/24/19 0702  BP: (!) 170/106 (!) 145/79  Pulse: 83 82  Resp:    Temp:    SpO2:      General: Pt is alert, awake, not in acute distress Cardiovascular: rate controlled, S1/S2 + Respiratory: bilateral decreased breath sounds at bases Abdominal: Soft, morbidly obese, NT, ND, bowel sounds + Extremities: no edema, no cyanosis    The results  of significant diagnostics from this hospitalization (including imaging, microbiology, ancillary and laboratory) are listed below for reference.     Microbiology: No results found for this or any previous visit (from the past 240 hour(s)).   Labs: BNP (last 3 results) No results for input(s): BNP in the last 8760 hours. Basic Metabolic Panel: Recent Labs  Lab 01/22/19 0243 01/23/19 0258 01/24/19 0210  NA 132* 137 137  K 4.2 4.4 4.2  CL 98 101 99  CO2 GLUCOSE 236* 134* 127*  BUN 25* 13 10  CREATININE 1.45* 1.20 0.95  CALCIUM 9.6 9.3 9.3  MG  --   --  1.6*   Liver Function Tests: Recent Labs  Lab 01/22/19 0243 01/23/19 0258 01/24/19 0210  AST ALT ALKPHOS 76 67 64  BILITOT 0.9 1.2 0.7  PROT 7.9 6.9 7.1  ALBUMIN 3.9 3.5 3.4*   Recent Labs  Lab 01/22/19 0243  LIPASE 38   No results for input(s): AMMONIA in the last 168 hours. CBC: Recent Labs  Lab 01/22/19 0243 01/23/19 0258 01/24/19 0210  WBC 11.1* 10.8* 7.9  NEUTROABS 7.8*  --  4.5  HGB 14.9 13.9 13.2  HCT 44.4 41.0 39.4  MCV 81.6 82.2 81.9  PLT 342 323 309   Cardiac Enzymes: No results for input(s): CKTOTAL, CKMB, CKMBINDEX, TROPONINI in the last 168 hours. BNP: Invalid input(s): POCBNP CBG: Recent Labs  Lab 01/23/19 1616 01/23/19 2019 01/24/19 0007 01/24/19 0430 01/24/19 0834  GLUCAP 182* 142* 143* 126* 160*   D-Dimer No results for input(s): DDIMER in the last 72 hours. Hgb A1c No results for input(s): HGBA1C in the last 72 hours. Lipid Profile Recent Labs    01/22/19 0921  CHOL 137  HDL 29*  LDLCALC 81  TRIG 161  CHOLHDL 4.7   Thyroid function studies No results for input(s): TSH, T4TOTAL, T3FREE, THYROIDAB in the last 72 hours.  Invalid input(s): FREET3 Anemia work up No results for input(s): VITAMINB12, FOLATE, FERRITIN, TIBC, IRON, RETICCTPCT in the last 72 hours. Urinalysis    Component Value Date/Time   COLORURINE YELLOW 01/22/2019 0507    APPEARANCEUR CLEAR 01/22/2019 0507   LABSPEC 1.012 01/22/2019 0507   PHURINE 5.0 01/22/2019 0507   GLUCOSEU 150 (A) 01/22/2019 0507   HGBUR MODERATE (A) 01/22/2019 0507   BILIRUBINUR NEGATIVE 01/22/2019 0507   KETONESUR 5 (A) 01/22/2019 0507   PROTEINUR 100 (A) 01/22/2019 0507   UROBILINOGEN 1.0 11/19/2013 1855   NITRITE NEGATIVE 01/22/2019 0507   LEUKOCYTESUR NEGATIVE 01/22/2019 0507   Sepsis Labs Invalid input(s): PROCALCITONIN,  WBC,  LACTICIDVEN Microbiology No results found for this or any previous visit (from the past 240 hour(s)).   Time coordinating discharge: 35 minutes  SIGNED:   Glade Lloyd, MD  [facility] 01/24/2019, 9:14 AM

## 2019-01-28 DIAGNOSIS — K8500 Idiopathic acute pancreatitis without necrosis or infection: Secondary | ICD-10-CM | POA: Diagnosis not present

## 2019-01-28 DIAGNOSIS — Z794 Long term (current) use of insulin: Secondary | ICD-10-CM | POA: Diagnosis not present

## 2019-01-29 ENCOUNTER — Encounter (HOSPITAL_COMMUNITY): Payer: Self-pay

## 2019-01-29 ENCOUNTER — Inpatient Hospital Stay (HOSPITAL_COMMUNITY)
Admission: EM | Admit: 2019-01-29 | Discharge: 2019-02-01 | DRG: 439 | Disposition: A | Discharge status: Home / Self Care | Payer: 59 | Attending: Internal Medicine | Admitting: Internal Medicine

## 2019-01-29 ENCOUNTER — Telehealth: Payer: Self-pay | Admitting: Interventional Cardiology

## 2019-01-29 DIAGNOSIS — K219 Gastro-esophageal reflux disease without esophagitis: Secondary | ICD-10-CM | POA: Diagnosis present

## 2019-01-29 DIAGNOSIS — Z6838 Body mass index (BMI) 38.0-38.9, adult: Secondary | ICD-10-CM

## 2019-01-29 DIAGNOSIS — Z7901 Long term (current) use of anticoagulants: Secondary | ICD-10-CM

## 2019-01-29 DIAGNOSIS — I480 Paroxysmal atrial fibrillation: Secondary | ICD-10-CM | POA: Diagnosis present

## 2019-01-29 DIAGNOSIS — K8500 Idiopathic acute pancreatitis without necrosis or infection: Secondary | ICD-10-CM | POA: Diagnosis not present

## 2019-01-29 DIAGNOSIS — G4733 Obstructive sleep apnea (adult) (pediatric): Secondary | ICD-10-CM | POA: Diagnosis present

## 2019-01-29 DIAGNOSIS — I110 Hypertensive heart disease with heart failure: Secondary | ICD-10-CM | POA: Diagnosis present

## 2019-01-29 DIAGNOSIS — Z7984 Long term (current) use of oral hypoglycemic drugs: Secondary | ICD-10-CM

## 2019-01-29 DIAGNOSIS — E785 Hyperlipidemia, unspecified: Secondary | ICD-10-CM | POA: Diagnosis present

## 2019-01-29 DIAGNOSIS — K859 Acute pancreatitis without necrosis or infection, unspecified: Principal | ICD-10-CM | POA: Diagnosis present

## 2019-01-29 DIAGNOSIS — Z91013 Allergy to seafood: Secondary | ICD-10-CM

## 2019-01-29 DIAGNOSIS — I5032 Chronic diastolic (congestive) heart failure: Secondary | ICD-10-CM | POA: Diagnosis present

## 2019-01-29 DIAGNOSIS — Z833 Family history of diabetes mellitus: Secondary | ICD-10-CM

## 2019-01-29 DIAGNOSIS — E669 Obesity, unspecified: Secondary | ICD-10-CM | POA: Diagnosis present

## 2019-01-29 DIAGNOSIS — Z91010 Allergy to peanuts: Secondary | ICD-10-CM

## 2019-01-29 DIAGNOSIS — Z8249 Family history of ischemic heart disease and other diseases of the circulatory system: Secondary | ICD-10-CM

## 2019-01-29 DIAGNOSIS — F17210 Nicotine dependence, cigarettes, uncomplicated: Secondary | ICD-10-CM | POA: Diagnosis present

## 2019-01-29 DIAGNOSIS — E119 Type 2 diabetes mellitus without complications: Secondary | ICD-10-CM | POA: Diagnosis present

## 2019-01-29 DIAGNOSIS — N179 Acute kidney failure, unspecified: Secondary | ICD-10-CM | POA: Diagnosis present

## 2019-01-29 DIAGNOSIS — Z91018 Allergy to other foods: Secondary | ICD-10-CM

## 2019-01-29 DIAGNOSIS — Z823 Family history of stroke: Secondary | ICD-10-CM

## 2019-01-29 DIAGNOSIS — E1165 Type 2 diabetes mellitus with hyperglycemia: Secondary | ICD-10-CM

## 2019-01-29 DIAGNOSIS — Z882 Allergy status to sulfonamides status: Secondary | ICD-10-CM

## 2019-01-29 LAB — URINALYSIS, ROUTINE W REFLEX MICROSCOPIC
Bacteria, UA: NONE SEEN
Bilirubin Urine: NEGATIVE
Glucose, UA: NEGATIVE mg/dL
Ketones, ur: NEGATIVE mg/dL
Leukocytes,Ua: NEGATIVE
Nitrite: NEGATIVE
Protein, ur: 100 mg/dL — AB
Specific Gravity, Urine: 1.018 (ref 1.005–1.030)
pH: 6 (ref 5.0–8.0)

## 2019-01-29 LAB — COMPREHENSIVE METABOLIC PANEL
ALT: 14 U/L (ref 0–44)
AST: 15 U/L (ref 15–41)
Albumin: 3.6 g/dL (ref 3.5–5.0)
Alkaline Phosphatase: 63 U/L (ref 38–126)
Anion gap: 10 (ref 5–15)
BUN: 21 mg/dL — ABNORMAL HIGH (ref 6–20)
CO2: 27 mmol/L (ref 22–32)
Calcium: 9.3 mg/dL (ref 8.9–10.3)
Chloride: 98 mmol/L (ref 98–111)
Creatinine, Ser: 1.38 mg/dL — ABNORMAL HIGH (ref 0.61–1.24)
GFR calc Af Amer: >60 mL/min (ref >60)
GFR calc non Af Amer: >60 mL/min (ref >60)
Glucose, Bld: 184 mg/dL — ABNORMAL HIGH (ref 70–99)
Potassium: 4.1 mmol/L (ref 3.5–5.1)
Sodium: 135 mmol/L (ref 135–145)
Total Bilirubin: 0.8 mg/dL (ref 0.3–1.2)
Total Protein: 7.4 g/dL (ref 6.5–8.1)

## 2019-01-29 LAB — CBC
HCT: 41.7 % (ref 39.0–52.0)
Hemoglobin: 13.8 g/dL (ref 13.0–17.0)
MCH: 27.3 pg (ref 26.0–34.0)
MCHC: 33.1 g/dL (ref 30.0–36.0)
MCV: 82.6 fL (ref 80.0–100.0)
Platelets: 362 10*3/uL (ref 150–400)
RBC: 5.05 MIL/uL (ref 4.22–5.81)
RDW: 12.4 % (ref 11.5–15.5)
WBC: 9.8 10*3/uL (ref 4.0–10.5)
nRBC: 0 % (ref 0.0–0.2)

## 2019-01-29 LAB — LIPASE, BLOOD: Lipase: 57 U/L — ABNORMAL HIGH (ref 11–51)

## 2019-01-29 MED ORDER — SODIUM CHLORIDE 0.9% FLUSH: 3.0000 mL | Freq: Once | INTRAVENOUS | Status: DC

## 2019-01-29 NOTE — ED Triage Notes (Signed)
Patient reports nausea x 2 days and constipation x 1 day. Patient also c/o abdominal "soreness", states "it's not a pain". Denies fever, SOB, cough, chest pain, or dysuria.

## 2019-01-29 NOTE — Telephone Encounter (Signed)
Will arrange for Douglas B. to get samples of Xarelto 20 mg for patient as she is in the office.

## 2019-01-29 NOTE — Telephone Encounter (Signed)
Patient calling the office for samples of medication: ° ° °1.  What medication and dosage are you requesting samples for? Xarelto  ° °2.  Are you currently out of this medication? yes ° ° °

## 2019-01-29 NOTE — Telephone Encounter (Signed)
Called pt and explained to him that I was leaving a $10 co-pay card downstairs for pt to pick up and activate and take to his pharmacy to be able to get the discount and explained to the pt that our samples are limited to pts that just starting this medication and if he has any other problems, questions or concerns to call the office. Pt verbalized understanding.

## 2019-01-30 ENCOUNTER — Emergency Department (HOSPITAL_COMMUNITY): Payer: 59

## 2019-01-30 ENCOUNTER — Encounter (HOSPITAL_COMMUNITY): Payer: Self-pay | Admitting: General Practice

## 2019-01-30 ENCOUNTER — Other Ambulatory Visit: Payer: Self-pay

## 2019-01-30 DIAGNOSIS — Z8249 Family history of ischemic heart disease and other diseases of the circulatory system: Secondary | ICD-10-CM | POA: Diagnosis not present

## 2019-01-30 DIAGNOSIS — Z91013 Allergy to seafood: Secondary | ICD-10-CM | POA: Diagnosis not present

## 2019-01-30 DIAGNOSIS — K859 Acute pancreatitis without necrosis or infection, unspecified: Secondary | ICD-10-CM | POA: Diagnosis not present

## 2019-01-30 DIAGNOSIS — K219 Gastro-esophageal reflux disease without esophagitis: Secondary | ICD-10-CM | POA: Diagnosis present

## 2019-01-30 DIAGNOSIS — I480 Paroxysmal atrial fibrillation: Secondary | ICD-10-CM | POA: Diagnosis not present

## 2019-01-30 DIAGNOSIS — E1165 Type 2 diabetes mellitus with hyperglycemia: Secondary | ICD-10-CM | POA: Diagnosis not present

## 2019-01-30 DIAGNOSIS — I5032 Chronic diastolic (congestive) heart failure: Secondary | ICD-10-CM

## 2019-01-30 DIAGNOSIS — I110 Hypertensive heart disease with heart failure: Secondary | ICD-10-CM | POA: Diagnosis present

## 2019-01-30 DIAGNOSIS — Z91010 Allergy to peanuts: Secondary | ICD-10-CM | POA: Diagnosis not present

## 2019-01-30 DIAGNOSIS — Z6838 Body mass index (BMI) 38.0-38.9, adult: Secondary | ICD-10-CM | POA: Diagnosis not present

## 2019-01-30 DIAGNOSIS — Z882 Allergy status to sulfonamides status: Secondary | ICD-10-CM | POA: Diagnosis not present

## 2019-01-30 DIAGNOSIS — N179 Acute kidney failure, unspecified: Secondary | ICD-10-CM | POA: Diagnosis not present

## 2019-01-30 DIAGNOSIS — Z823 Family history of stroke: Secondary | ICD-10-CM | POA: Diagnosis not present

## 2019-01-30 DIAGNOSIS — K869 Disease of pancreas, unspecified: Secondary | ICD-10-CM | POA: Diagnosis not present

## 2019-01-30 DIAGNOSIS — R590 Localized enlarged lymph nodes: Secondary | ICD-10-CM | POA: Diagnosis not present

## 2019-01-30 DIAGNOSIS — E785 Hyperlipidemia, unspecified: Secondary | ICD-10-CM | POA: Diagnosis present

## 2019-01-30 DIAGNOSIS — E119 Type 2 diabetes mellitus without complications: Secondary | ICD-10-CM | POA: Diagnosis present

## 2019-01-30 DIAGNOSIS — E669 Obesity, unspecified: Secondary | ICD-10-CM | POA: Diagnosis present

## 2019-01-30 DIAGNOSIS — I10 Essential (primary) hypertension: Secondary | ICD-10-CM

## 2019-01-30 DIAGNOSIS — Z91018 Allergy to other foods: Secondary | ICD-10-CM | POA: Diagnosis not present

## 2019-01-30 DIAGNOSIS — F17210 Nicotine dependence, cigarettes, uncomplicated: Secondary | ICD-10-CM | POA: Diagnosis present

## 2019-01-30 DIAGNOSIS — Z833 Family history of diabetes mellitus: Secondary | ICD-10-CM | POA: Diagnosis not present

## 2019-01-30 DIAGNOSIS — Z7901 Long term (current) use of anticoagulants: Secondary | ICD-10-CM | POA: Diagnosis not present

## 2019-01-30 DIAGNOSIS — Z7984 Long term (current) use of oral hypoglycemic drugs: Secondary | ICD-10-CM | POA: Diagnosis not present

## 2019-01-30 DIAGNOSIS — G4733 Obstructive sleep apnea (adult) (pediatric): Secondary | ICD-10-CM | POA: Diagnosis present

## 2019-01-30 LAB — GLUCOSE, CAPILLARY
Glucose-Capillary: 123 mg/dL — ABNORMAL HIGH (ref 70–99)
Glucose-Capillary: 118 mg/dL — ABNORMAL HIGH (ref 70–99)

## 2019-01-30 IMAGING — CT CT ABDOMEN AND PELVIS WITH CONTRAST
2 of 5 series · 16 of 46 positions shown, 18 images · IV contrast (Omni 300)
Comparison: CT of the abdomen and pelvis [DATE] and [DATE]

CLINICAL DATA: Nausea. Two day history of constipation. Abdominal
pain, acute. Cocaine and alcohol use.

EXAM:
CT ABDOMEN AND PELVIS WITH CONTRAST
TECHNIQUE: Multidetector CT imaging of the abdomen and pelvis was performed
using the standard protocol following bolus administration of
intravenous contrast.
CONTRAST:  125mL OMNIPAQUE IOHEXOL 300 MG/ML  SOLN

[Series 3: a/p w/ 5mm · axial · 0.93mm/px · z∈[-406,+40]mm · 13 of 101 slices shown, 15 images]
[im 6/101  soft-tissue]
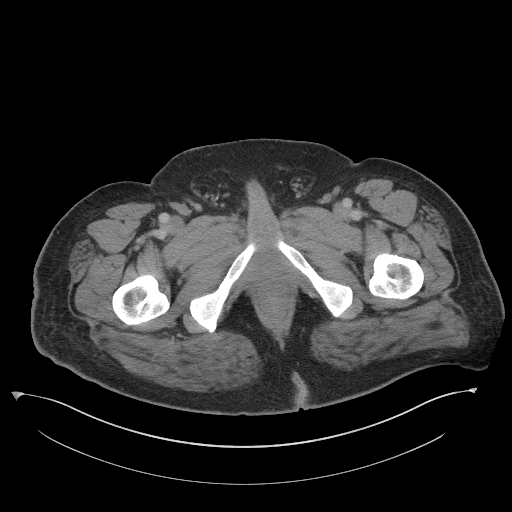
[im 6/101  bone]
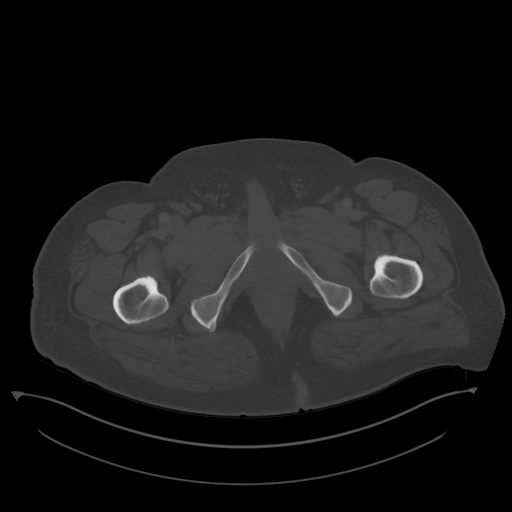
[im 16/101  soft-tissue]
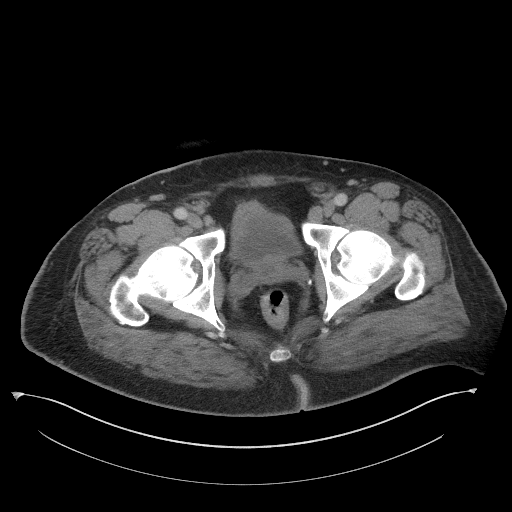
[im 22/101  soft-tissue]
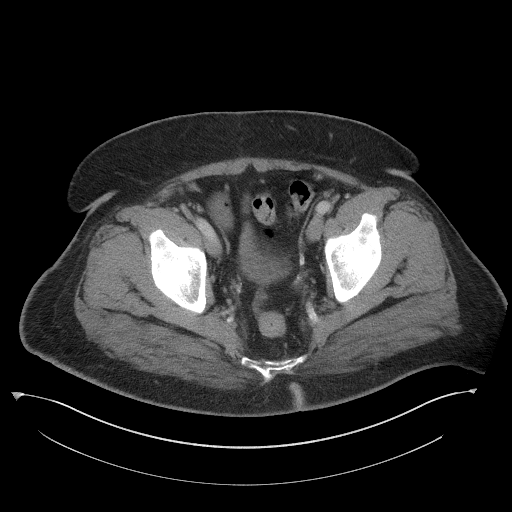
[im 27/101  soft-tissue]
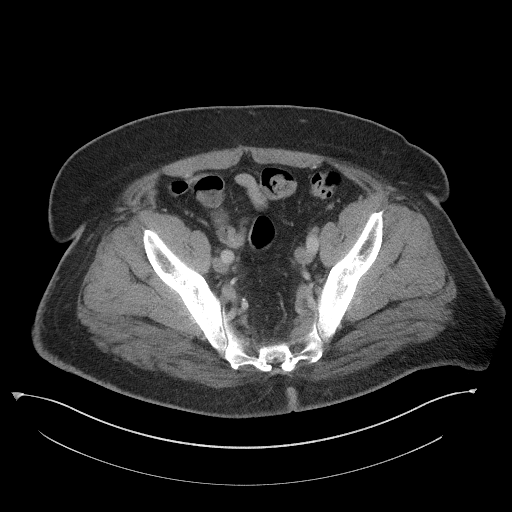
[im 37/101  soft-tissue]
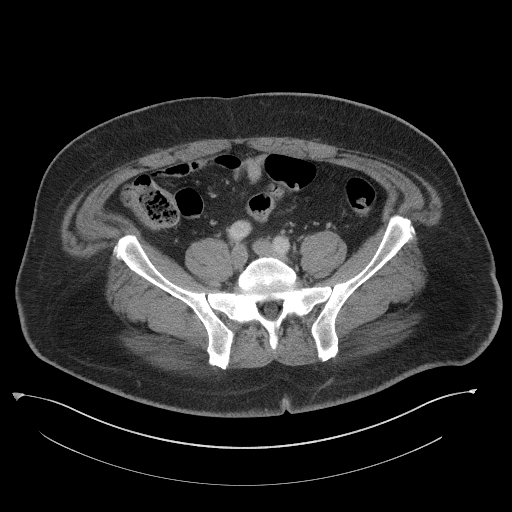
[im 43/101  soft-tissue]
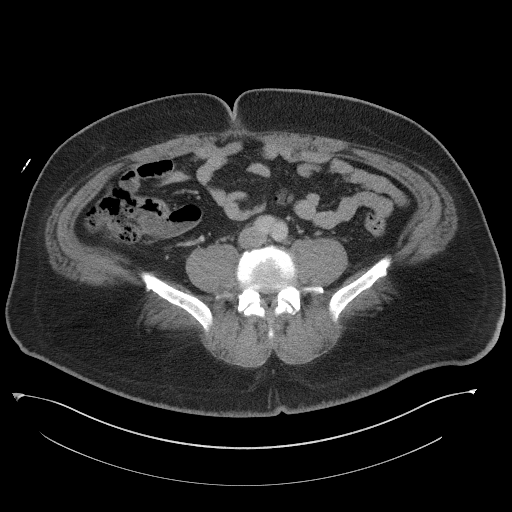
[im 53/101  soft-tissue]
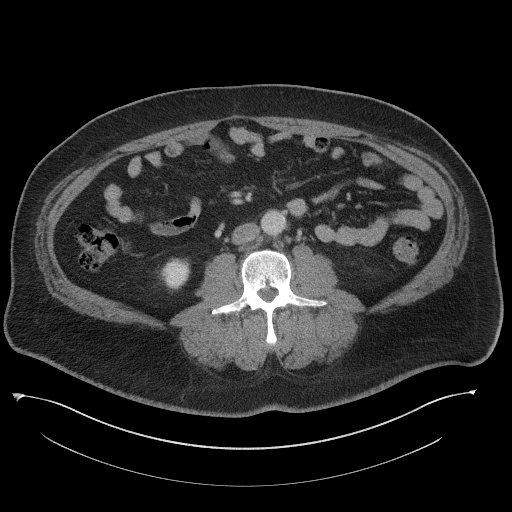
[im 58/101  soft-tissue]
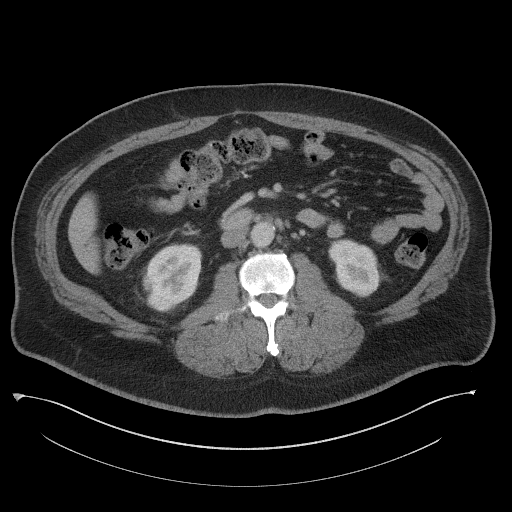
[im 64/101  soft-tissue]
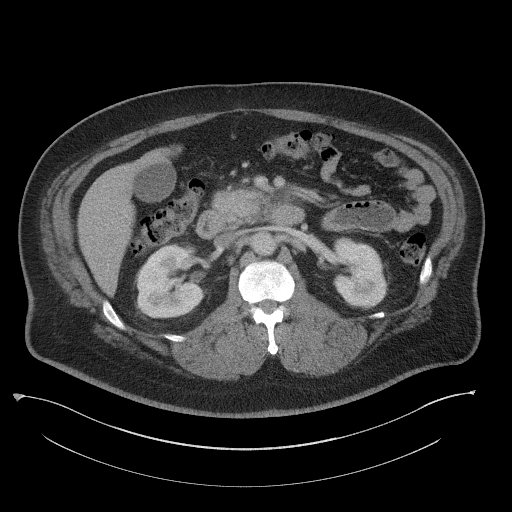
[im 64/101  bone]
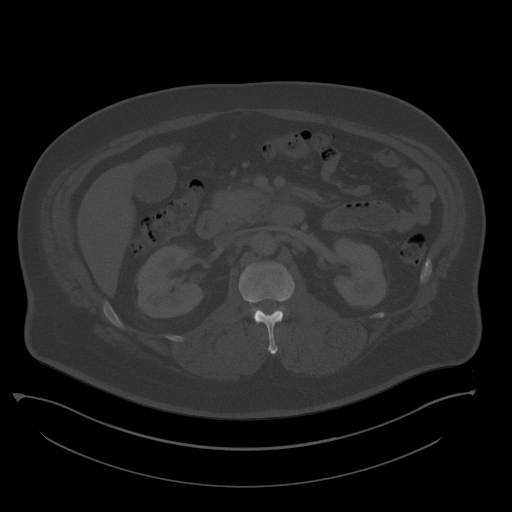
[im 74/101  soft-tissue]
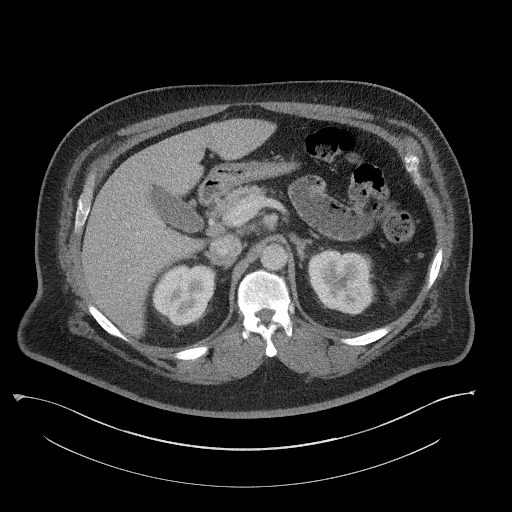
[im 79/101  soft-tissue]
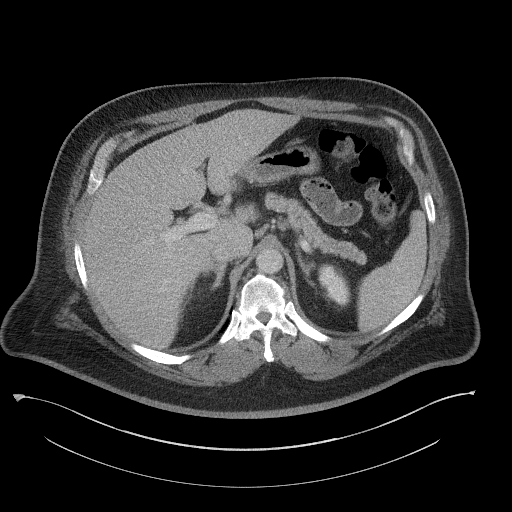
[im 85/101  soft-tissue]
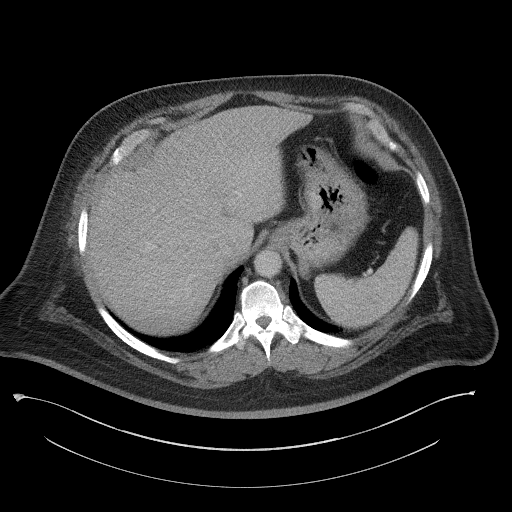
[im 95/101  soft-tissue]
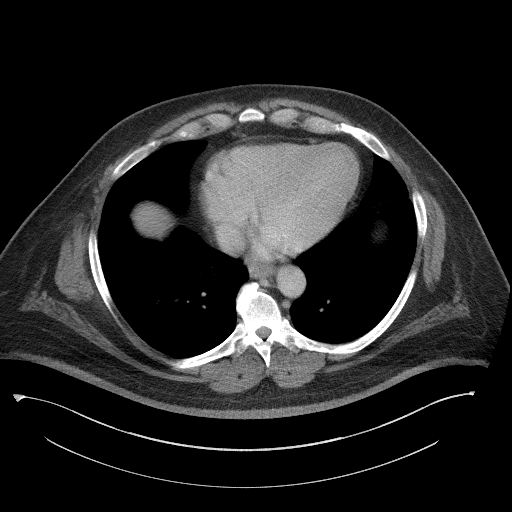

[Series 6: a/p w/ cor · coronal · 0.94mm/px · 3 of 141 slices shown]
[im 47/141  soft-tissue]
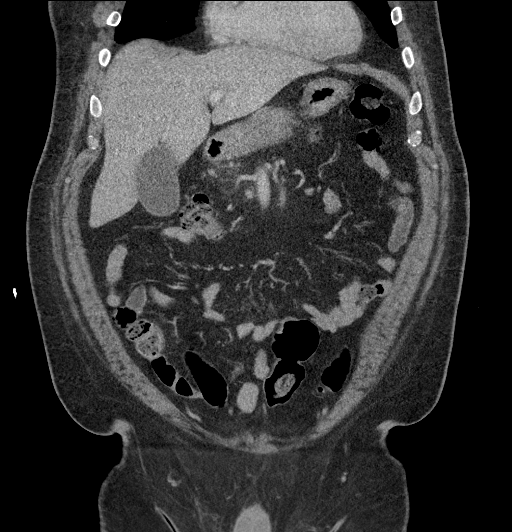
[im 63/141  soft-tissue]
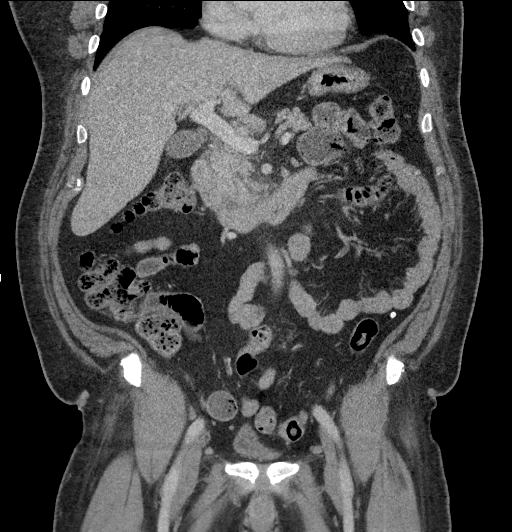
[im 78/141  soft-tissue]
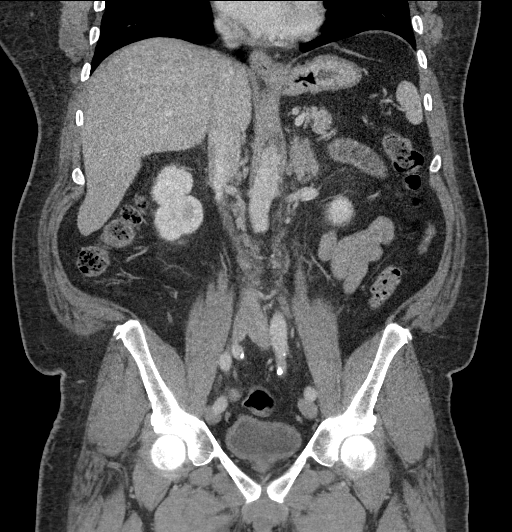

[16 of 46 positions shown; findings below may reference images not displayed]

FINDINGS: Lower chest: Mild ground-glass attenuation of lower lobes
bilaterally is consistent with atelectasis. The heart size is
normal. No significant pleural or pericardial effusions are present.

Hepatobiliary: No focal liver abnormality is seen. No gallstones,
gallbladder wall thickening, or biliary dilatation.

Pancreas: Inflammatory changes are again noted about the uncinate
process the pancreas. This is most prominent medial. There is no
mass lesion or cyst. The distal body and tail are within normal
limits.

Spleen: Normal in size without focal abnormality.

Adrenals/Urinary Tract: Adrenal glands are normal. Kidneys and
ureters are within normal limits. There is no stone or mass lesion.
No obstruction is present. The urinary bladder is normal.

Stomach/Bowel: The stomach and duodenum are within normal limits.
Small bowel is unremarkable. Patient is status post appendectomy.
Ascending and transverse colon are within normal limits. Descending
and sigmoid colon are normal.

Vascular/Lymphatic: Atherosclerotic calcifications are present in
the aorta and branch vessels without aneurysm or significant
stenosis. Is subcentimeter periportal lymph node is stable. Other
smaller nodes are likely reactive.

Reproductive: Prostate is unremarkable.

Other: No abdominal wall hernia or abnormality. No abdominopelvic
ascites.

Musculoskeletal: Healed right transverse process fractures are noted
at L1, L2, and L3. Vertebral body heights alignment are maintained.
No acute fractures are present. Pelvis is within normal limits. The
hips are located.
IMPRESSION: 1. Inflammatory changes involving the uncinate process of the
pancreas consistent with acute pancreatitis. No complicating
features are present.
2. Reactive periportal adenopathy.
3.  Aortic Atherosclerosis ([IR]-[IR]).

## 2019-01-30 MED ORDER — ONDANSETRON HCL 4 MG/2ML IJ SOLN
4.0000 mg | Freq: Once | INTRAMUSCULAR | Status: AC
  Administered 2019-01-30: 4 mg via INTRAVENOUS
  Filled 2019-01-30: qty 2

## 2019-01-30 MED ORDER — ONDANSETRON HCL 4 MG/2ML IJ SOLN
4.0000 mg | Freq: Four times a day (QID) | INTRAMUSCULAR | Status: DC | PRN
  Administered 2019-01-30 – 2019-01-31 (×5): 4 mg via INTRAVENOUS
  Filled 2019-01-30 (×5): qty 2

## 2019-01-30 MED ORDER — FLEET ENEMA 7-19 GM/118ML RE ENEM: 1.0000 | ENEMA | Freq: Once | RECTAL | Status: DC | PRN

## 2019-01-30 MED ORDER — SODIUM CHLORIDE 0.9 % IV BOLUS
1000.0000 mL | Freq: Once | INTRAVENOUS | Status: AC
  Administered 2019-01-30: 1000 mL via INTRAVENOUS

## 2019-01-30 MED ORDER — LISINOPRIL 20 MG PO TABS
20.0000 mg | ORAL_TABLET | Freq: Every day | ORAL | Status: DC
  Administered 2019-01-30 – 2019-02-01 (×3): 20 mg via ORAL
  Filled 2019-01-30 (×3): qty 1

## 2019-01-30 MED ORDER — MORPHINE SULFATE (PF) 4 MG/ML IV SOLN
4.0000 mg | Freq: Once | INTRAVENOUS | Status: AC
  Administered 2019-01-30: 4 mg via INTRAVENOUS
  Filled 2019-01-30: qty 1

## 2019-01-30 MED ORDER — INSULIN ASPART 100 UNIT/ML ~~LOC~~ SOLN: 0.0000 [IU] | Freq: Every day | SUBCUTANEOUS | Status: DC

## 2019-01-30 MED ORDER — HYDROMORPHONE HCL 1 MG/ML IJ SOLN
0.5000 mg | INTRAMUSCULAR | Status: DC | PRN
  Administered 2019-01-30 (×3): 0.5 mg via INTRAVENOUS
  Administered 2019-01-30 – 2019-01-31 (×5): 1 mg via INTRAVENOUS
  Filled 2019-01-30: qty 0.5
  Filled 2019-01-30: qty 1
  Filled 2019-01-30 (×2): qty 0.5
  Filled 2019-01-30 (×2): qty 1
  Filled 2019-01-30: qty 0.5
  Filled 2019-01-30: qty 1
  Filled 2019-01-30: qty 0.5

## 2019-01-30 MED ORDER — SODIUM CHLORIDE 0.9 % IV SOLN
INTRAVENOUS | Status: DC
  Administered 2019-01-30 – 2019-01-31 (×4): via INTRAVENOUS

## 2019-01-30 MED ORDER — IOHEXOL 300 MG/ML  SOLN
125.0000 mL | Freq: Once | INTRAMUSCULAR | Status: AC | PRN
  Administered 2019-01-30: 125 mL via INTRAVENOUS

## 2019-01-30 MED ORDER — INSULIN ASPART 100 UNIT/ML ~~LOC~~ SOLN
0.0000 [IU] | Freq: Three times a day (TID) | SUBCUTANEOUS | Status: DC
  Administered 2019-01-30 – 2019-02-01 (×3): 2 [IU] via SUBCUTANEOUS
  Administered 2019-02-01: 1 [IU] via SUBCUTANEOUS

## 2019-01-30 MED ORDER — RIVAROXABAN 20 MG PO TABS
20.0000 mg | ORAL_TABLET | Freq: Every day | ORAL | Status: DC
  Administered 2019-01-30 – 2019-01-31 (×2): 20 mg via ORAL
  Filled 2019-01-30 (×2): qty 1

## 2019-01-30 MED ORDER — LACTATED RINGERS IV SOLN
INTRAVENOUS | Status: DC
  Administered 2019-01-30: 06:00:00 via INTRAVENOUS

## 2019-01-30 MED ORDER — FLECAINIDE ACETATE 50 MG PO TABS
100.0000 mg | ORAL_TABLET | Freq: Two times a day (BID) | ORAL | Status: DC
  Administered 2019-01-30 – 2019-02-01 (×5): 100 mg via ORAL
  Filled 2019-01-30 (×5): qty 2

## 2019-01-30 MED ORDER — SENNOSIDES-DOCUSATE SODIUM 8.6-50 MG PO TABS
1.0000 | ORAL_TABLET | Freq: Every evening | ORAL | Status: DC | PRN
  Administered 2019-01-31: 1 via ORAL
  Filled 2019-01-30: qty 1

## 2019-01-30 MED ORDER — PROMETHAZINE HCL 25 MG/ML IJ SOLN
12.5000 mg | Freq: Once | INTRAMUSCULAR | Status: AC
  Administered 2019-01-30: 12.5 mg via INTRAVENOUS
  Filled 2019-01-30: qty 1

## 2019-01-30 MED ORDER — METOPROLOL SUCCINATE ER 25 MG PO TB24
25.0000 mg | ORAL_TABLET | Freq: Every day | ORAL | Status: DC
  Administered 2019-01-30 – 2019-02-01 (×3): 25 mg via ORAL
  Filled 2019-01-30 (×3): qty 1

## 2019-01-30 MED ORDER — ATORVASTATIN CALCIUM 10 MG PO TABS
10.0000 mg | ORAL_TABLET | Freq: Every day | ORAL | Status: DC
  Administered 2019-01-30 – 2019-02-01 (×3): 10 mg via ORAL
  Filled 2019-01-30 (×3): qty 1

## 2019-01-30 MED ORDER — SORBITOL 70 % SOLN
30.0000 mL | Freq: Every day | Status: DC | PRN
  Administered 2019-02-01: 30 mL via ORAL
  Filled 2019-01-30 (×3): qty 30

## 2019-01-30 NOTE — ED Notes (Signed)
ED TO INPATIENT HANDOFF REPORT  ED Nurse Name and Phone #: Tray [place], [phone]  S Name/Age/Gender [name] 48 y.o. male Room/Bed: 026C/026C  Code Status   Code Status: Full Code  Home/SNF/Other Home Patient oriented to: self, place, time and situation Is this baseline? Yes   Triage Complete: Triage complete  Chief Complaint Nausea, fever  Triage Note Patient reports nausea x 2 days and constipation x 1 day. Patient also c/o abdominal "soreness", states "it's not a pain". Denies fever, SOB, cough, chest pain, or dysuria.    Allergies Allergies  Allergen Reactions  . Shrimp [Shellfish Allergy] Anaphylaxis  . Banana Other (See Comments)    Pt gags  . Watermelon Flavor Nausea And Vomiting  . Other Itching    Grapes  . Peanut-Containing Drug Products Itching and Cough  . Almond Oil Itching    Roof of mouth itches  . Sulfa Antibiotics Itching    Level of Care/Admitting Diagnosis ED Disposition    ED Disposition Condition Comment   Admit  Hospital Area: [facility] [[id number]]  Level of Care: Med-Surg [16]  I expect the patient will be discharged within 24 hours: No (not a candidate for 5C-Observation unit)  Diagnosis: Acute pancreatitis [577.0.ICD-9-CM]  Admitting Physician: Leatha Gilding [[id number]]  Attending Physician: Leatha Gilding [phone]  PT Class (Do Not Modify): Observation [104]  PT Acc Code (Do Not Modify): Observation [10022]       B Medical/Surgery History Past Medical History:  Diagnosis Date  . Diabetes mellitus   . GERD (gastroesophageal reflux disease)   . History of alcohol abuse   . History of cocaine use   . History of echocardiogram    a. Echo 4/14: Moderate LVH, vigorous LVEF, EF 65-70%, normal wall motion, grade 2 diastolic dysfunction, mildly dilated aortic root and ascending aorta, ascending aorta 40 mm, aortic root 38 mm, mild LAE  . Hx of cardiovascular stress test    a. GXT 5/14: No ischemic changes   //  b. ETT-Myoview 3/16:  Low risk, no ischemia, EF 58%  . Hyperlipidemia   . Hypertension   . Morbid obesity (HCC)   . Obesity   . Paroxysmal atrial fibrillation (HCC)    Occurring in 2008, with several recurrence since then (including in the setting of + cocaine on UDS).  . Sleep apnea    Past Surgical History:  Procedure Laterality Date  . APPENDECTOMY    . ROTATOR CUFF REPAIR    . TONSILLECTOMY       A IV Location/Drains/Wounds Patient Lines/Drains/Airways Status   Active Line/Drains/Airways    Name:   Placement date:   Placement time:   Site:   Days:   Peripheral IV 01/30/19 Right Hand   01/30/19    0228    Hand   less than 1          Intake/Output Last 24 hours  Intake/Output Summary (Last 24 hours) at 01/30/2019 0803 Last data filed at 01/30/2019 4696 Gross per 24 hour  Intake 1257.23 ml  Output -  Net 1257.23 ml    Labs/Imaging Results for orders placed or performed during the hospital encounter of 01/29/19 (from the past 48 hour(s))  Urinalysis, Routine w reflex microscopic     Status: Abnormal   Collection Time: 01/29/19 10:35 PM  Result Value Ref Range   Color, Urine YELLOW YELLOW   APPearance CLEAR CLEAR   Specific Gravity, Urine 1.018 1.005 - 1.030   pH 6.0 5.0 -  8.0   Glucose, UA NEGATIVE NEGATIVE mg/dL   Hgb urine dipstick SMALL (A) NEGATIVE   Bilirubin Urine NEGATIVE NEGATIVE   Ketones, ur NEGATIVE NEGATIVE mg/dL   Protein, ur 312 (A) NEGATIVE mg/dL   Nitrite NEGATIVE NEGATIVE   Leukocytes,Ua NEGATIVE NEGATIVE   RBC / HPF 0-5 0 - 5 RBC/hpf   WBC, UA 0-5 0 - 5 WBC/hpf   Bacteria, UA NONE SEEN NONE SEEN   Hyaline Casts, UA PRESENT     Comment: Performed at [facility], [address] [place], [place] [address]  Lipase, blood     Status: Abnormal   Collection Time: 01/29/19 10:47 PM  Result Value Ref Range   Lipase 57 (H) 11 - 51 U/L    Comment: Performed at [facility], [address], [place], [place] [address]  Comprehensive  metabolic panel     Status: Abnormal   Collection Time: 01/29/19 10:47 PM  Result Value Ref Range   Sodium 135 135 - 145 mmol/L   Potassium 4.1 3.5 - 5.1 mmol/L   Chloride 98 98 - 111 mmol/L   CO2 27 22 - 32 mmol/L   Glucose, Bld 184 (H) 70 - 99 mg/dL   BUN 21 (H) 6 - 20 mg/dL   Creatinine, Ser 3.66 (H) 0.61 - 1.24 mg/dL   Calcium 9.3 8.9 - 81.5 mg/dL   Total Protein 7.4 6.5 - 8.1 g/dL   Albumin 3.6 3.5 - 5.0 g/dL   AST 15 15 - 41 U/L   ALT 14 0 - 44 U/L   Alkaline Phosphatase 63 38 - 126 U/L   Total Bilirubin 0.8 0.3 - 1.2 mg/dL   GFR calc non Af Amer >60 >60 mL/min   GFR calc Af Amer >60 >60 mL/min   Anion gap 10 5 - 15    Comment: Performed at [facility], [address], [place], [place] [address]  CBC     Status: None   Collection Time: 01/29/19 10:47 PM  Result Value Ref Range   WBC 9.8 4.0 - 10.5 K/uL   RBC 5.05 4.22 - 5.81 MIL/uL   Hemoglobin 13.8 13.0 - 17.0 g/dL   HCT 61.5 18.3 - 43.7 %   MCV 82.6 80.0 - 100.0 fL   MCH 27.3 26.0 - 34.0 pg   MCHC 33.1 30.0 - 36.0 g/dL   RDW 35.7 89.7 - 84.7 %   Platelets 362 150 - 400 K/uL   nRBC 0.0 0.0 - 0.2 %    Comment: Performed at [facility], [address] [place], [place] [address]   Ct Abdomen Pelvis W Contrast  Result Date: 01/30/2019 CLINICAL DATA:  Nausea. Two day history of constipation. Abdominal pain, acute. Cocaine and alcohol use. EXAM: CT ABDOMEN AND PELVIS WITH CONTRAST TECHNIQUE: Multidetector CT imaging of the abdomen and pelvis was performed using the standard protocol following bolus administration of intravenous contrast. CONTRAST:  OMNIPAQUE IOHEXOL 300 MG/ML  SOLN COMPARISON:  CT of the abdomen and pelvis 01/22/2019 and 11/30/18 FINDINGS: Lower chest: Mild ground-glass attenuation of lower lobes bilaterally is consistent with atelectasis. The heart size is normal. No significant pleural or pericardial effusions are present. Hepatobiliary: No focal liver abnormality is seen. No gallstones,  gallbladder wall thickening, or biliary dilatation. Pancreas: Inflammatory changes are again noted about the uncinate process the pancreas. This is most prominent medial. There is no mass lesion or cyst. The distal body and tail are within normal limits.  Spleen: Normal in size without focal abnormality. Adrenals/Urinary Tract: Adrenal glands are normal. Kidneys and ureters are within normal limits. There is no stone or mass lesion. No obstruction is present. The urinary bladder is normal. Stomach/Bowel: The stomach and duodenum are within normal limits. Small bowel is unremarkable. Patient is status post appendectomy. Ascending and transverse colon are within normal limits. Descending and sigmoid colon are normal. Vascular/Lymphatic: Atherosclerotic calcifications are present in the aorta and branch vessels without aneurysm or significant stenosis. Is subcentimeter periportal lymph node is stable. Other smaller nodes are likely reactive. Reproductive: Prostate is unremarkable. Other: No abdominal wall hernia or abnormality. No abdominopelvic ascites. Musculoskeletal: Healed right transverse process fractures are noted at L1, L2, and L3. Vertebral body heights alignment are maintained. No acute fractures are present. Pelvis is within normal limits. The hips are located. IMPRESSION: 1. Inflammatory changes involving the uncinate process of the pancreas consistent with acute pancreatitis. No complicating features are present. 2. Reactive periportal adenopathy. 3.  Aortic Atherosclerosis (ICD10-I70.0). Electronically Signed   By: Marin Robertshristopher  Mattern [name]   On: 01/30/2019 05:27    Pending Labs [employer]ed Labs (From admission, onward)    Start     Ordered   Signed and Held  Comprehensive metabolic panel  Tomorrow morning,   R     Signed and Held   Signed and Held  CBC  Tomorrow morning,   R     Signed and Held          Vitals/Pain Today's Vitals   01/30/19 0200 01/30/19 0215 01/30/19 0330 01/30/19 0345   BP: (!) 137/92 (!) 131/96 (!) 147/98 (!) 135/99  Pulse: 80 79 76 78  Resp:      Temp:      TempSrc:      SpO2: 96%  95% 95%  Weight:      Height:      PainSc:        Isolation Precautions No active isolations  Medications Medications  sodium chloride flush (NS) 0.9 % injection 3 mL (has no administration in time range)  HYDROmorphone (DILAUDID) injection 0.5-1 mg (has no administration in time range)  ondansetron (ZOFRAN) injection 4 mg (has no administration in time range)  insulin aspart (novoLOG) injection 0-9 Units (has no administration in time range)  insulin aspart (novoLOG) injection 0-5 Units (has no administration in time range)  sodium chloride 0.9 % bolus 1,000 mL (0 mLs Intravenous Stopped 01/30/19 0340)  ondansetron (ZOFRAN) injection 4 mg (4 mg Intravenous Given 01/30/19 0229)  morphine 4 MG/ML injection 4 mg (4 mg Intravenous Given 01/30/19 0229)  morphine 4 MG/ML injection 4 mg (4 mg Intravenous Given 01/30/19 0513)  ondansetron (ZOFRAN) injection 4 mg (4 mg Intravenous Given 01/30/19 0513)  sodium chloride 0.9 % bolus 1,000 mL (1,000 mLs Intravenous New Bag/Given 01/30/19 0511)  iohexol (OMNIPAQUE) 300 MG/ML solution 125 mL (125 mLs Intravenous Contrast Given 01/30/19 0447)    Mobility walks Low fall risk   Focused Assessments GI   R Recommendations: See Admitting Provider Note  Report given to:   Additional Notes:

## 2019-01-30 NOTE — ED Provider Notes (Signed)
[facility] EMERGENCY DEPARTMENT Provider Note   CSN: [id number] Arrival date & time: 01/29/19  2215    History   Chief Complaint Chief Complaint  Patient presents with  . Nausea    HPI [name] is a 48 y.o. male.     Patient presents to the emergency department with a chief complaint of nausea and epigastric abdominal pain.  He states that he was admitted to the hospital about 1 week ago for pancreatitis.  He reports that he is never really started to feel better since the most recent admission.  He reports eating Jell-O and applesauce daily since his last admission.  Does have a history of alcohol abuse and cocaine abuse, but denies using either of these to me tonight.  Denies any successful treatments prior to arrival.  He denies any vomiting or lower abdominal pain.  Denies any other associated symptoms.  The history is provided by the patient. No language interpreter was used.    Past Medical History:  Diagnosis Date  . Diabetes mellitus   . GERD (gastroesophageal reflux disease)   . History of alcohol abuse   . History of cocaine use   . History of echocardiogram    a. Echo 4/14: Moderate LVH, vigorous LVEF, EF 65-70%, normal wall motion, grade 2 diastolic dysfunction, mildly dilated aortic root and ascending aorta, ascending aorta 40 mm, aortic root 38 mm, mild LAE  . Hx of cardiovascular stress test    a. GXT 5/14: No ischemic changes  //  b. ETT-Myoview 3/16:  Low risk, no ischemia, EF 58%  . Hyperlipidemia   . Hypertension   . Morbid obesity (HCC)   . Obesity   . Paroxysmal atrial fibrillation (HCC)    Occurring in 2008, with several recurrence since then (including in the setting of + cocaine on UDS).  . Sleep apnea     Patient Active Problem List   Diagnosis Date Noted  . Acute pancreatitis 01/22/2019  . Class 3 obesity due to excess calories with serious comorbidity and body mass index (BMI) of 40.0 to 44.9 in adult 12/08/2016   . PAF (paroxysmal atrial fibrillation) (HCC) 01/23/2013  . Diabetes mellitus (HCC) 01/23/2013  . Hypertensive heart disease 01/23/2013  . Tobacco user 01/23/2013  . Chronic diastolic heart failure (HCC) 01/21/2013  . 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 Shoulder pain, right 02/15/2011  . Seasonal and perennial allergic rhinitis 02/13/2008  . Dyslipidemia 02/12/2008  . OBESITY, MORBID 02/12/2008  . COCAINE Abuse, past hx 02/12/2008  . Obstructive sleep apnea 02/12/2008    Past Surgical History:  Procedure Laterality Date  . APPENDECTOMY    . ROTATOR CUFF REPAIR    . TONSILLECTOMY          Home Medications    Prior to Admission medications   Medication Sig Start Date End Date Taking? Authorizing Provider  atorvastatin (LIPITOR) 10 MG tablet Take 10 mg by mouth daily. 06/08/17   [provider]  flecainide (TAMBOCOR) 100 MG tablet Take 1 tablet (100 mg total) by mouth 2 (two) times daily. 01/08/18   Corky Crafts, MD  glipiZIDE (GLUCOTROL) 10 MG tablet Take 10 mg by mouth 2 (two) times daily. 01/06/19   [provider]  lisinopril (PRINIVIL,ZESTRIL) 20 MG tablet Take 1 tablet (20 mg total) by mouth daily for 30 days. 01/24/19 02/23/19  Glade Lloyd, MD  metFORMIN (GLUCOPHAGE) 500 MG tablet Take 1,000 mg by mouth 2 (two) times daily. 01/06/19  [provider]  metoprolol succinate (TOPROL-XL) 25 MG 24 hr tablet Take 1 tablet (25 mg total) by mouth daily. 01/08/18   Corky Crafts, MD  ondansetron (ZOFRAN) 4 MG tablet Take 1 tablet (4 mg total) by mouth every 6 (six) hours as needed for nausea. 01/24/19   Glade Lloyd, MD  oxyCODONE (OXY IR/ROXICODONE) 5 MG immediate release tablet Take 1 tablet (5 mg total) by mouth every 6 (six) hours as needed for moderate pain. 01/24/19   Glade Lloyd, MD  pantoprazole (PROTONIX) 40 MG tablet Take 1 tablet (40 mg total) by mouth daily. 01/24/19   Glade Lloyd, MD  rivaroxaban (XARELTO) 20 MG TABS tablet  TAKE 1 TABLET (20 MG TOTAL) BY MOUTH DAILY WITH SUPPER. 01/15/19   Corky Crafts, MD    Family History Family History  Problem Relation Age of Onset  . Diabetes Mother   . Heart attack Father 46  . Stroke Maternal Grandmother   . Stroke Paternal Grandmother   . Diabetes Paternal Grandfather     Social History Social History   Tobacco Use  . Smoking status: Current Every Day Smoker    Packs/day: 1.00    Years: 28.00    Pack years: 28.00    Last attempt to quit: 10/17/2007    Years since quitting: 11.2  . Smokeless tobacco: Former Neurosurgeon    Types: Snuff  . Tobacco comment: smokes one pack per day  Substance Use Topics  . Alcohol use: Yes    Comment: occasionally - 1 beer 1x/mont  . Drug use: No    Comment: cocaine in the past, none currently     Allergies   Shrimp [shellfish allergy]; Banana; Watermelon flavor; Other; Peanut-containing drug products; Almond oil; and Sulfa antibiotics   Review of Systems Review of Systems  All other systems reviewed and are negative.    Physical Exam Updated Vital Signs BP (!) 157/105 (BP Location: Right Arm)   Pulse 89   Temp 97.9 F (36.6 C) (Oral)   Resp 16   Ht  (1.753 m)   Wt 127 kg   SpO2 95%   BMI 41.35 kg/m   Physical Exam Vitals signs and nursing note reviewed.  Constitutional:      Appearance: He is well-developed.  HENT:     Head: Normocephalic and atraumatic.  Eyes:     General: No scleral icterus.       Right eye: No discharge.        Left eye: No discharge.     Conjunctiva/sclera: Conjunctivae normal.     Pupils: Pupils are equal, round, and reactive to light.  Neck:     Musculoskeletal: Normal range of motion and neck supple.     Vascular: No JVD.  Cardiovascular:     Rate and Rhythm: Normal rate and regular rhythm.     Heart sounds: Normal heart sounds. No murmur. No friction rub. No gallop.   Pulmonary:     Effort: Pulmonary effort is normal. No respiratory distress.     Breath sounds:  Normal breath sounds. No wheezing or rales.  Chest:     Chest wall: No tenderness.  Abdominal:     General: There is no distension.     Palpations: Abdomen is soft. There is no mass.     Tenderness: There is no abdominal tenderness. There is no guarding or rebound.  Musculoskeletal: Normal range of motion.        General: No tenderness.  Skin:  General: Skin is warm and dry.  Neurological:     Mental Status: He is alert and oriented to person, place, and time.  Psychiatric:        Behavior: Behavior normal.        Thought Content: Thought content normal.        Judgment: Judgment normal.      ED Treatments / Results  Labs (all labs ordered are listed, but only abnormal results are displayed) Labs Reviewed  LIPASE, BLOOD - Abnormal; Notable for the following components:      Result Value   Lipase 57 (*)    All other components within normal limits  COMPREHENSIVE METABOLIC PANEL - Abnormal; Notable for the following components:   Glucose, Bld 184 (*)    BUN 21 (*)    Creatinine, Ser 1.38 (*)    All other components within normal limits  URINALYSIS, ROUTINE W REFLEX MICROSCOPIC - Abnormal; Notable for the following components:   Hgb urine dipstick SMALL (*)    Protein, ur 100 (*)    All other components within normal limits  CBC    EKG None  Radiology No results found.  Procedures Procedures (including critical care time)  Medications Ordered in ED Medications  sodium chloride flush (NS) 0.9 % injection 3 mL (has no administration in time range)  sodium chloride 0.9 % bolus 1,000 mL (has no administration in time range)  ondansetron (ZOFRAN) injection 4 mg (has no administration in time range)  morphine 4 MG/ML injection 4 mg (has no administration in time range)     Initial Impression / Assessment and Plan / ED Course  I have reviewed the triage vital signs and the nursing notes.  Pertinent labs & imaging results that were available during my care of the  patient were reviewed by me and considered in my medical decision making (see chart for details).        Patient with history of pancreatitis.  Was recently admitted to the hospital for pancreatitis.  Presents again tonight with the same complaint of nausea and epigastric abdominal pain.  Is that he has been eating Jell-O and applesauce over the past week, but has not had any improvement of his symptoms.  His pain has been moderately controlled with pain medication.  He has been given fluids in the ED.  His lipase is increased from his last visit.  Patient is uncomfortable with plan for discharge, and states that his pain is such that he feels that he needs to be admitted to the hospital.  CT scan again shows findings consistent with acute pancreatitis.  Patient seen by and discussed with Dr. Manus Gunningancour.  Appreciate Dr. Arlean HoppingHowerter for bringing patient into the hospital.  Final Clinical Impressions(s) / ED Diagnoses   Final diagnoses:  Acute pancreatitis, unspecified complication status, unspecified pancreatitis type    ED Discharge Orders    None       Roxy HorsemanBrowning, Berry Godsey, PA-C 01/30/19 0556    Glynn Octaveancour, Stephen, MD 01/30/19 [phone]

## 2019-01-30 NOTE — Progress Notes (Signed)
48 y.o. male with history of alcohol abuse who is admitted for acute pancreatitis after presenting with epigastric discomfort associated with nausea/vomiting.  CT abdomen/pelvis reportedly demonstrates inflammation of pancreas consistent with acute pancreatitis. Will be carry-over admission to day hospitalist service.        Newton Pigg, DO Hospitalist

## 2019-01-30 NOTE — H&P (Signed)
History and Physical    [name] RUE:[id number] DOB: [birth year] DOA: 01/29/2019  I have briefly reviewed the patient's prior medical records in [facility]  PCP: Renford Dills, MD  Patient coming from: home  Chief Complaint: Abdominal pain, nausea, poor p.o. intake  HPI: [name] is a 48 y.o. male with medical history significant of type 2 diabetes mellitus, GERD, history of alcohol and polysubstance abuse, chronic diastolic CHF, hypertension, hyperlipidemia, obesity, paroxysmal A. fib on Xarelto, came to the hospital with complaints of abdominal pain as well as nausea.  Patient was recently hospitalized and discharged about 6 days ago with acute pancreatitis.  He was treated conservatively, his diet was advanced, he appears to be tolerating it well and he was discharged home.  He tells me that he has never really recovered and continued to feel poorly at times, and over the last few days has been having difficulties eating and drinking as well as worsening epigastric abdominal pain with nausea.  He tells me he cannot really vomit.  Patient tells me that he used to drink alcohol heavily in the past but has not had alcohol in years, now he drinks maybe 1 or 2 beers every other month.  He continues to smoke cigarettes.  He denies any fever or chills, denies any chest pain, denies any shortness of breath.  ED Course: In the ED he is afebrile, blood pressure is on the high side in the 180s systolic, satting well on room air.  Blood work reveals a creatinine of 1.38 from prior normal values.  CBC is unremarkable.  He underwent a CT scan consistent with pancreatitis.  We are asked to admit for persistent pancreatitis symptoms  Review of Systems: As per HPI otherwise 10 point review of systems negative.   Past Medical History:  Diagnosis Date  . Diabetes mellitus   . GERD (gastroesophageal reflux disease)   . History of alcohol abuse   . History of cocaine use   .  History of echocardiogram    a. Echo 4/14: Moderate LVH, vigorous LVEF, EF 65-70%, normal wall motion, grade 2 diastolic dysfunction, mildly dilated aortic root and ascending aorta, ascending aorta 40 mm, aortic root 38 mm, mild LAE  . Hx of cardiovascular stress test    a. GXT 5/14: No ischemic changes  //  b. ETT-Myoview 3/16:  Low risk, no ischemia, EF 58%  . Hyperlipidemia   . Hypertension   . Morbid obesity (HCC)   . Obesity   . Paroxysmal atrial fibrillation (HCC)    Occurring in 2008, with several recurrence since then (including in the setting of + cocaine on UDS).  . Sleep apnea     Past Surgical History:  Procedure Laterality Date  . APPENDECTOMY    . ROTATOR CUFF REPAIR    . TONSILLECTOMY       reports that he has been smoking. He has a 28.00 pack-year smoking history. He has quit using smokeless tobacco.  His smokeless tobacco use included snuff. He reports current alcohol use. He reports that he does not use drugs.  Allergies  Allergen Reactions  . Shrimp [Shellfish Allergy] Anaphylaxis  . Banana Other (See Comments)    Pt gags  . Watermelon Flavor Nausea And Vomiting  . Other Itching    Grapes  . Peanut-Containing Drug Products Itching and Cough  . Almond Oil Itching    Roof of mouth itches  . Sulfa Antibiotics Itching    Family History  Problem  Relation Age of Onset  . Diabetes Mother   . Heart attack Father [phone]  . Stroke Maternal Grandmother   . Stroke Paternal Grandmother   . Diabetes Paternal Grandfather     Prior to Admission medications   Medication Sig Start Date End Date Taking? Authorizing Provider  atorvastatin (LIPITOR) 10 MG tablet Take 10 mg by mouth daily. 06/08/17  Yes [provider]  flecainide (TAMBOCOR) 100 MG tablet Take 1 tablet (100 mg total) by mouth 2 (two) times daily. 01/08/18  Yes Corky CraftsVaranasi, Jayadeep S, MD  glipiZIDE (GLUCOTROL) 10 MG tablet Take 10 mg by mouth 2 (two) times daily. 01/06/19  Yes [provider]   lisinopril (PRINIVIL,ZESTRIL) 20 MG tablet Take 1 tablet (20 mg total) by mouth daily for 30 days. 01/24/19 02/23/19 Yes Glade LloydAlekh, Kshitiz, MD  metFORMIN (GLUCOPHAGE) 500 MG tablet Take 1,000 mg by mouth 2 (two) times daily. 01/06/19  Yes [provider]  metoprolol succinate (TOPROL-XL) 25 MG 24 hr tablet Take 1 tablet (25 mg total) by mouth daily. 01/08/18  Yes Corky CraftsVaranasi, Jayadeep S, MD  ondansetron (ZOFRAN) 4 MG tablet Take 1 tablet (4 mg total) by mouth every 6 (six) hours as needed for nausea. 01/24/19  Yes Glade LloydAlekh, Kshitiz, MD  oxyCODONE (OXY IR/ROXICODONE) 5 MG immediate release tablet Take 1 tablet (5 mg total) by mouth every 6 (six) hours as needed for moderate pain. 01/24/19  Yes Glade LloydAlekh, Kshitiz, MD  pantoprazole (PROTONIX) 40 MG tablet Take 1 tablet (40 mg total) by mouth daily. 01/24/19  Yes Glade LloydAlekh, Kshitiz, MD  rivaroxaban (XARELTO) 20 MG TABS tablet TAKE 1 TABLET (20 MG TOTAL) BY MOUTH DAILY WITH SUPPER. Patient taking differently: Take 20 mg by mouth daily with supper.  01/15/19   Corky CraftsVaranasi, Jayadeep S, MD    Physical Exam: Vitals:   01/30/19 0200 01/30/19 0215 01/30/19 0330 01/30/19 0345  BP: (!) 137/92 (!) 131/96 (!) 147/98 (!) 135/99  Pulse: 80 79 76 78  Resp:      Temp:      TempSrc:      SpO2: 96%  95% 95%  Weight:      Height:          Constitutional: NAD, calm, comfortable Eyes: PERRL, lids and conjunctivae normal ENMT: Mucous membranes are moist. Posterior pharynx clear of any exudate or lesions.Normal dentition.  Neck: normal, supple, no masses, no thyromegaly Respiratory: clear to auscultation bilaterally, no wheezing, no crackles. Normal respiratory effort. No accessory muscle use.  Cardiovascular: Regular rate and rhythm, no murmurs / rubs / gallops. No extremity edema. 2+ pedal pulses.  Abdomen: no tenderness, no masses palpated. Bowel sounds positive.  Musculoskeletal: no clubbing / cyanosis. Normal muscle tone.  Skin: no rashes, lesions, ulcers. No induration  Neurologic: CN 2-12 grossly intact. Strength 5/5 in all 4.  Psychiatric: Normal judgment and insight. Alert and oriented x 3. Normal mood.   Labs on Admission: I have personally reviewed following labs and imaging studies  CBC: Recent Labs  Lab 01/24/19 0210 01/29/19 2247  WBC 7.9 9.8  NEUTROABS 4.5  --   HGB 13.2 13.8  HCT 39.4 41.7  MCV 81.9 82.6  PLT 309 362   Basic Metabolic Panel: Recent Labs  Lab 01/24/19 0210 01/29/19 2247  NA 137 135  K 4.2 4.1  CL 99 98  CO2 28 27  GLUCOSE 127* 184*  BUN 10 21*  CREATININE 0.95 1.38*  CALCIUM 9.3 9.3  MG 1.6*  --    GFR: Estimated Creatinine  Clearance: 86.3 mL/min (A) (by C-G formula based on SCr of 1.38 mg/dL (H)). Liver Function Tests: Recent Labs  Lab 01/24/19 0210 01/29/19 2247  AST 18 15  ALT 13 14  ALKPHOS 64 63  BILITOT 0.7 0.8  PROT 7.1 7.4  ALBUMIN 3.4* 3.6   Recent Labs  Lab 01/29/19 2247  LIPASE 57*   No results for input(s): AMMONIA in the last 168 hours. Coagulation Profile: No results for input(s): INR, PROTIME in the last 168 hours. Cardiac Enzymes: No results for input(s): CKTOTAL, CKMB, CKMBINDEX, TROPONINI in the last 168 hours. BNP (last 3 results) No results for input(s): PROBNP in the last 8760 hours. HbA1C: No results for input(s): HGBA1C in the last 72 hours. CBG: Recent Labs  Lab 01/23/19 1616 01/23/19 2019 01/24/19 0007 01/24/19 0430 01/24/19 0834  GLUCAP 182* 142* 143* 126* 160*   Lipid Profile: No results for input(s): CHOL, HDL, LDLCALC, TRIG, CHOLHDL, LDLDIRECT in the last 72 hours. Thyroid Function Tests: No results for input(s): TSH, T4TOTAL, FREET4, T3FREE, THYROIDAB in the last 72 hours. Anemia Panel: No results for input(s): VITAMINB12, FOLATE, FERRITIN, TIBC, IRON, RETICCTPCT in the last 72 hours. Urine analysis:    Component Value Date/Time   COLORURINE YELLOW 01/29/2019 2235   APPEARANCEUR CLEAR 01/29/2019 2235   LABSPEC 1.018 01/29/2019 2235   PHURINE 6.0  01/29/2019 2235   GLUCOSEU NEGATIVE 01/29/2019 2235   HGBUR SMALL (A) 01/29/2019 2235   BILIRUBINUR NEGATIVE 01/29/2019 2235   KETONESUR NEGATIVE 01/29/2019 2235   PROTEINUR 100 (A) 01/29/2019 2235   UROBILINOGEN 1.0 11/19/2013 1855   NITRITE NEGATIVE 01/29/2019 2235   LEUKOCYTESUR NEGATIVE 01/29/2019 2235     Radiological Exams on Admission: Ct Abdomen Pelvis W Contrast  Result Date: 01/30/2019 CLINICAL DATA:  Nausea. Two day history of constipation. Abdominal pain, acute. Cocaine and alcohol use. EXAM: CT ABDOMEN AND PELVIS WITH CONTRAST TECHNIQUE: Multidetector CT imaging of the abdomen and pelvis was performed using the standard protocol following bolus administration of intravenous contrast. CONTRAST:  OMNIPAQUE IOHEXOL 300 MG/ML  SOLN COMPARISON:  CT of the abdomen and pelvis 01/22/2019 and 11/30/18 FINDINGS: Lower chest: Mild ground-glass attenuation of lower lobes bilaterally is consistent with atelectasis. The heart size is normal. No significant pleural or pericardial effusions are present. Hepatobiliary: No focal liver abnormality is seen. No gallstones, gallbladder wall thickening, or biliary dilatation. Pancreas: Inflammatory changes are again noted about the uncinate process the pancreas. This is most prominent medial. There is no mass lesion or cyst. The distal body and tail are within normal limits. Spleen: Normal in size without focal abnormality. Adrenals/Urinary Tract: Adrenal glands are normal. Kidneys and ureters are within normal limits. There is no stone or mass lesion. No obstruction is present. The urinary bladder is normal. Stomach/Bowel: The stomach and duodenum are within normal limits. Small bowel is unremarkable. Patient is status post appendectomy. Ascending and transverse colon are within normal limits. Descending and sigmoid colon are normal. Vascular/Lymphatic: Atherosclerotic calcifications are present in the aorta and branch vessels without aneurysm or  significant stenosis. Is subcentimeter periportal lymph node is stable. Other smaller nodes are likely reactive. Reproductive: Prostate is unremarkable. Other: No abdominal wall hernia or abnormality. No abdominopelvic ascites. Musculoskeletal: Healed right transverse process fractures are noted at L1, L2, and L3. Vertebral body heights alignment are maintained. No acute fractures are present. Pelvis is within normal limits. The hips are located. IMPRESSION: 1. Inflammatory changes involving the uncinate process of the pancreas consistent with acute pancreatitis. No  complicating features are present. 2. Reactive periportal adenopathy. 3.  Aortic Atherosclerosis (ICD10-I70.0). Electronically Signed   By: Marin Roberts [name]   On: 01/30/2019 05:27    Assessment/Plan Active Problems:   Acute pancreatitis   Principal Problem Acute pancreatitis -Evaluated last time with normal triglycerides, no gallstones on right upper quadrant ultrasound.  He was treated with IV fluids, initially n.p.o. and tolerated diet advancement -CT scan of the abdomen pelvis today without complicating features, it did show persistent inflammatory changes in the uncinate process of the pancreas.  Continue conservative management for now, with n.p.o., IV fluids, pain control -If persistent symptoms may benefit from GI consultation  Active Problems Paroxysmal A. fib -Currently appears to be in sinus on bedside monitor, continue flecainide, metoprolol, Xarelto  Acute kidney injury -Likely in the setting of poor p.o. intake, continue IV fluids and repeat renal function tomorrow morning  Chronic diastolic CHF -Appears compensated and euvolemic.  We will give IV fluids for pancreatitis and closely monitor  Type 2 diabetes mellitus -Continue sliding scale, hold home oral agents  Hypertension -Continue lisinopril.  His hydrochlorothiazide was held last time he was hospitalized and suspicious for cause of pancreatitis   Hyperlipidemia -Continue Lipitor  OSA -Continue CPAP   DVT prophylaxis: on Xarelto  Code Status: Full code  Family Communication: no family at bedside  Disposition Plan: home when ready  Consults called: none     Pamella Pert, MD, PhD [facility]  Contact via [email or web address]  [facility] Office Info P: [phone]  F: [phone]   01/30/2019, 8:50 AM

## 2019-01-30 NOTE — Progress Notes (Signed)
Pt admitted to 5 west room 2. A&O x4. VS stable. Skin intact. All questions/concerns addressed. Call bell within reach. Will continue to monitor.

## 2019-01-31 LAB — COMPREHENSIVE METABOLIC PANEL
ALT: 14 U/L (ref 0–44)
AST: 16 U/L (ref 15–41)
Albumin: 3.3 g/dL — ABNORMAL LOW (ref 3.5–5.0)
Alkaline Phosphatase: 56 U/L (ref 38–126)
Anion gap: 5 (ref 5–15)
BUN: 12 mg/dL (ref 6–20)
CO2: 33 mmol/L — ABNORMAL HIGH (ref 22–32)
Calcium: 9.5 mg/dL (ref 8.9–10.3)
Chloride: 100 mmol/L (ref 98–111)
Creatinine, Ser: 1.14 mg/dL (ref 0.61–1.24)
GFR calc Af Amer: >60 mL/min (ref >60)
GFR calc non Af Amer: >60 mL/min (ref >60)
Glucose, Bld: 128 mg/dL — ABNORMAL HIGH (ref 70–99)
Potassium: 4.5 mmol/L (ref 3.5–5.1)
Sodium: 138 mmol/L (ref 135–145)
Total Bilirubin: 1 mg/dL (ref 0.3–1.2)
Total Protein: 7 g/dL (ref 6.5–8.1)

## 2019-01-31 LAB — CBC
HCT: 37 % — ABNORMAL LOW (ref 39.0–52.0)
Hemoglobin: 12.6 g/dL — ABNORMAL LOW (ref 13.0–17.0)
MCH: 28.3 pg (ref 26.0–34.0)
MCHC: 34.1 g/dL (ref 30.0–36.0)
MCV: 83.1 fL (ref 80.0–100.0)
Platelets: 301 10*3/uL (ref 150–400)
RBC: 4.45 MIL/uL (ref 4.22–5.81)
RDW: 12.5 % (ref 11.5–15.5)
WBC: 8.8 10*3/uL (ref 4.0–10.5)
nRBC: 0 % (ref 0.0–0.2)

## 2019-01-31 LAB — GLUCOSE, CAPILLARY
Glucose-Capillary: 115 mg/dL — ABNORMAL HIGH (ref 70–99)
Glucose-Capillary: 136 mg/dL — ABNORMAL HIGH (ref 70–99)

## 2019-01-31 MED ORDER — ACETAMINOPHEN 325 MG PO TABS: 650.0000 mg | ORAL_TABLET | Freq: Four times a day (QID) | ORAL | Status: DC | PRN

## 2019-01-31 MED ORDER — HYDROMORPHONE HCL 1 MG/ML IJ SOLN
0.5000 mg | INTRAMUSCULAR | Status: DC | PRN
  Administered 2019-01-31: 1 mg via INTRAVENOUS
  Administered 2019-02-01: 0.5 mg via INTRAVENOUS
  Filled 2019-01-31: qty 1
  Filled 2019-01-31: qty 0.5

## 2019-01-31 MED ORDER — TRAMADOL HCL 50 MG PO TABS
50.0000 mg | ORAL_TABLET | Freq: Four times a day (QID) | ORAL | Status: DC | PRN
  Administered 2019-01-31 – 2019-02-01 (×3): 50 mg via ORAL
  Filled 2019-01-31 (×3): qty 1

## 2019-01-31 MED ORDER — SODIUM CHLORIDE 0.9 % IV SOLN
INTRAVENOUS | Status: AC
  Administered 2019-01-31: 22:00:00 via INTRAVENOUS

## 2019-01-31 NOTE — Progress Notes (Signed)
PROGRESS NOTE   [name]  ZOX:[id number]    DOB: 1970/12/06    DOA: 01/29/2019  PCP: Renford Dills, MD   I have briefly reviewed patients previous medical records in [facility].  Brief Narrative:  48 year old male with PMH of DM 2, GERD, alcohol and polysubstance abuse, chronic diastolic CHF, HTN, HLD, obesity, PAF on Xarelto, recently hospitalized for acute pancreatitis, presented back to ED on 4/16 due to difficulty eating and drinking from worsening epigastric pain and nausea, no emesis, has not had alcohol since December, CT abdomen consistent with acute pancreatitis.   Assessment & Plan:   Active Problems:   Acute pancreatitis   Acute pancreatitis: During recent hospitalization, had normal triglycerides, no gallstones on RUQ ultrasound, had been treated conservatively and improved.  CT abdomen 4/16 shows acute pancreatitis of the uncinate process without complicating features.  Again treated conservatively with NPO, IV fluids and pain management.  Clinically improved.  Start full liquids today and if tolerates then advance to soft diet tomorrow.  If has recurrent/worsening symptoms then consider GI consultation in-house otherwise can follow-up as outpatient.  PAF: Appears to be in sinus rhythm.  Continue flecainide, metoprolol and Xarelto.  Check EKG to monitor QTC.  Acute kidney injury: Due to poor oral intake.  Resolved after IV fluids.  Chronic diastolic CHF: Compensated.  Type II DM: Good inpatient control on SSI.  Oral meds held.  Essential hypertension: HCTZ held during last hospitalization.  Continue metoprolol and lisinopril.  Hyperlipidemia: Remains on home atorvastatin.  OSA: Continue CPAP.  Tobacco abuse: Cessation counseled.  Obesity/Body mass index is 38.76 kg/m.    DVT prophylaxis: Xarelto Code Status: Full Family Communication: None at bedside Disposition: DC home pending clinical improvement, hopefully 4/18.   Consultants:   None  Procedures:  None  Antimicrobials:  None   Subjective: Reports that he feels much better.  Abdominal pain down to 4/10 and intermittent.  No nausea or vomiting.  Had BM yesterday.  Asking for coffee to drink.  Denies recent alcohol intake.  ROS: As above, otherwise negative.  Objective:  Vitals:   01/30/19 2100 01/31/19 0500 01/31/19 0552 01/31/19 1225  BP: 124/82  (!) 171/99 (!) 146/114  Pulse: 76  77 72  Resp: 16  17   Temp: 98 F (36.7 C)  97.6 F (36.4 C) 97.7 F (36.5 C)  TempSrc: Oral  Oral Oral  SpO2: 98%  99% 97%  Weight:  119.1 kg    Height:        Examination:  General exam: Pleasant young male, moderately built and obese lying comfortably propped up in bed without distress.  Oral mucosa moist. Respiratory system: Clear to auscultation. Respiratory effort normal. Cardiovascular system: S1 & S2 heard, RRR. No JVD, murmurs, rubs, gallops or clicks. No pedal edema. Gastrointestinal system: Abdomen is nondistended, soft and nontender. No organomegaly or masses felt. Normal bowel sounds heard. Central nervous system: Alert and oriented. No focal neurological deficits. Extremities: Symmetric 5 x 5 power. Skin: No rashes, lesions or ulcers Psychiatry: Judgement and insight appear normal. Mood & affect appropriate.     Data Reviewed: I have personally reviewed following labs and imaging studies  CBC: Recent Labs  Lab 01/29/19 2247 01/31/19 0259  WBC 9.8 8.8  HGB 13.8 12.6*  HCT 41.7 37.0*  MCV 82.6 83.1  PLT 362 301   Basic Metabolic Panel: Recent Labs  Lab 01/29/19 2247 01/31/19 0259  NA 135 138  K 4.1 4.5  CL 98  100  CO2 27 33*  GLUCOSE 184* 128*  BUN 21* 12  CREATININE 1.38* 1.14  CALCIUM 9.3 9.5   Liver Function Tests: Recent Labs  Lab 01/29/19 2247 01/31/19 0259  AST 15 16  ALT 14 14  ALKPHOS 63 56  BILITOT 0.8 1.0  PROT 7.4 7.0  ALBUMIN 3.6 3.3*   CBG: Recent Labs  Lab 01/30/19 1712 01/30/19 2059 01/31/19 0755   GLUCAP 123* 118* 115*    No results found for this or any previous visit (from the past 240 hour(s)).       Radiology Studies: Ct Abdomen Pelvis W Contrast  Result Date: 01/30/2019 CLINICAL DATA:  Nausea. Two day history of constipation. Abdominal pain, acute. Cocaine and alcohol use. EXAM: CT ABDOMEN AND PELVIS WITH CONTRAST TECHNIQUE: Multidetector CT imaging of the abdomen and pelvis was performed using the standard protocol following bolus administration of intravenous contrast. CONTRAST:  125mL OMNIPAQUE IOHEXOL 300 MG/ML  SOLN COMPARISON:  CT of the abdomen and pelvis 01/22/2019 and 11/30/18 FINDINGS: Lower chest: Mild ground-glass attenuation of lower lobes bilaterally is consistent with atelectasis. The heart size is normal. No significant pleural or pericardial effusions are present. Hepatobiliary: No focal liver abnormality is seen. No gallstones, gallbladder wall thickening, or biliary dilatation. Pancreas: Inflammatory changes are again noted about the uncinate process the pancreas. This is most prominent medial. There is no mass lesion or cyst. The distal body and tail are within normal limits. Spleen: Normal in size without focal abnormality. Adrenals/Urinary Tract: Adrenal glands are normal. Kidneys and ureters are within normal limits. There is no stone or mass lesion. No obstruction is present. The urinary bladder is normal. Stomach/Bowel: The stomach and duodenum are within normal limits. Small bowel is unremarkable. Patient is status post appendectomy. Ascending and transverse colon are within normal limits. Descending and sigmoid colon are normal. Vascular/Lymphatic: Atherosclerotic calcifications are present in the aorta and branch vessels without aneurysm or significant stenosis. Is subcentimeter periportal lymph node is stable. Other smaller nodes are likely reactive. Reproductive: Prostate is unremarkable. Other: No abdominal wall hernia or abnormality. No abdominopelvic ascites.  Musculoskeletal: Healed right transverse process fractures are noted at L1, L2, and L3. Vertebral body heights alignment are maintained. No acute fractures are present. Pelvis is within normal limits. The hips are located. IMPRESSION: 1. Inflammatory changes involving the uncinate process of the pancreas consistent with acute pancreatitis. No complicating features are present. 2. Reactive periportal adenopathy. 3.  Aortic Atherosclerosis (ICD10-I70.0). Electronically Signed   By: Marin Robertshristopher  Mattern [name]   On: 01/30/2019 05:27        Scheduled Meds:  atorvastatin  10 mg Oral Daily   flecainide  100 mg Oral BID   insulin aspart  0-5 Units Subcutaneous QHS   insulin aspart  0-9 Units Subcutaneous TID WC   lisinopril  20 mg Oral Daily   metoprolol succinate  25 mg Oral Daily   rivaroxaban  20 mg Oral Q supper   sodium chloride flush  3 mL Intravenous Once   Continuous Infusions:  sodium chloride 150 mL/hr at 01/31/19 1146     LOS: 1 day     Marcellus ScottAnand Orvis Stann, MD, [place] CityFACP, [facility]. [facility]  To contact the attending provider between 7A-7P or the covering provider during after hours 7P-7A, please log into the web site [email or web address] and access using universal [place] password for that web site. If you do not have the password, please call the hospital operator.  01/31/2019, 4:04 PM

## 2019-02-01 DIAGNOSIS — E1165 Type 2 diabetes mellitus with hyperglycemia: Secondary | ICD-10-CM

## 2019-02-01 LAB — GLUCOSE, CAPILLARY
Glucose-Capillary: 164 mg/dL — ABNORMAL HIGH (ref 70–99)
Glucose-Capillary: 125 mg/dL — ABNORMAL HIGH (ref 70–99)

## 2019-02-01 NOTE — Discharge Summary (Signed)
Physician Discharge Summary  [name] ZOX:[id number] DOB: 1971/07/17  PCP: Renford Dills, MD  Admit date: 01/29/2019 Discharge date: 02/01/2019  Recommendations for Outpatient Follow-up:  1. Dr. Renford Dills, PCP in 1 week.  To be seen with repeat labs (CBC & CMP). 2. [facility] in 1 week.  Patient reportedly had an appointment on 4/17 that he missed because of current hospitalization.  He was advised to call on Monday and reschedule for a new appointment.  Home Health: None Equipment/Devices: None  Discharge Condition: Improved and stable CODE STATUS: Full Diet recommendation: Heart healthy and diabetic diet.  Counseled regarding low-fat diet.  Discharge Diagnoses:  Active Problems:   Acute pancreatitis   Brief Summary: 48 year old male with PMH of DM 2, GERD, alcohol and polysubstance abuse, chronic diastolic CHF, HTN, HLD, obesity, PAF on Xarelto, recently hospitalized for acute pancreatitis, presented back to ED on 4/16 due to difficulty eating and drinking from worsening epigastric pain and nausea, no emesis, has not had alcohol since December, CT abdomen consistent with acute pancreatitis.   Assessment & Plan:  Acute pancreatitis: During recent hospitalization, had normal triglycerides, no gallstones on RUQ ultrasound, had been treated conservatively and improved.  CT abdomen 4/16 shows acute pancreatitis of the uncinate process without complicating features.  Again treated conservatively with NPO, IV fluids and pain management.  His diet was gradually advanced and he has tolerated low-fat diet.  He still has mild intermittent pain but improved compared to admission and he feels comfortable going home.  Etiology of his acute pancreatitis is unclear.  Thereby we will temporarily hold his atorvastatin at discharge.  Re-assess during outpatient follow-up regarding timing of resumption of atorvastatin.  Patient was advised to reschedule [facility] consultation appointment  that he missed yesterday.  PAF: Appears to be in sinus rhythm.  Continue flecainide, metoprolol and Xarelto.    EKG on 4/17: QTC 442 milliseconds.  Acute kidney injury: Due to poor oral intake.  Resolved after IV fluids.  Chronic diastolic CHF: Compensated.  Not on diuretics PTA.  Type II DM: Good inpatient control on SSI.  Oral meds held and treated in the hospital with SSI but will resume oral hypoglycemics at discharge.  Essential hypertension: HCTZ held during last hospitalization.  Continue metoprolol and lisinopril.  Hyperlipidemia: As discussed above, atorvastatin held at discharge and to be reassessed during close outpatient follow-up.  OSA: Continue CPAP.  Tobacco abuse: Cessation counseled.  Obesity/Body mass index is 38.76 kg/m.   Consultants:  None  Procedures:  None   Discharge Instructions  Discharge Instructions    Call MD for:  difficulty breathing, headache or visual disturbances   Complete by:  As directed    Call MD for:  extreme fatigue   Complete by:  As directed    Call MD for:  persistant dizziness or light-headedness   Complete by:  As directed    Call MD for:  persistant nausea and vomiting   Complete by:  As directed    Call MD for:  severe uncontrolled pain   Complete by:  As directed    Call MD for:  temperature >100.4   Complete by:  As directed    Diet - low sodium heart healthy   Complete by:  As directed    Diet Carb Modified   Complete by:  As directed    Increase activity slowly   Complete by:  As directed        Medication List    STOP  taking these medications   atorvastatin 10 MG tablet Commonly known as:  LIPITOR     TAKE these medications   flecainide 100 MG tablet Commonly known as:  TAMBOCOR Take 1 tablet (100 mg total) by mouth 2 (two) times daily.   glipiZIDE 10 MG tablet Commonly known as:  GLUCOTROL Take 10 mg by mouth 2 (two) times daily.   lisinopril 20 MG tablet Commonly known as:   ZESTRIL Take 1 tablet (20 mg total) by mouth daily for 30 days.   metFORMIN 500 MG tablet Commonly known as:  GLUCOPHAGE Take 1,000 mg by mouth 2 (two) times daily.   metoprolol succinate 25 MG 24 hr tablet Commonly known as:  TOPROL-XL Take 1 tablet (25 mg total) by mouth daily.   ondansetron 4 MG tablet Commonly known as:  ZOFRAN Take 1 tablet (4 mg total) by mouth every 6 (six) hours as needed for nausea.   oxyCODONE 5 MG immediate release tablet Commonly known as:  Oxy IR/ROXICODONE Take 1 tablet (5 mg total) by mouth every 6 (six) hours as needed for moderate pain.   pantoprazole 40 MG tablet Commonly known as:  PROTONIX Take 1 tablet (40 mg total) by mouth daily.   rivaroxaban 20 MG Tabs tablet Commonly known as:  Xarelto TAKE 1 TABLET (20 MG TOTAL) BY MOUTH DAILY WITH SUPPER. What changed:    how much to take  how to take this  when to take this  additional instructions      Follow-up Information    Renford DillsPolite, Ronald, MD. Schedule an appointment as soon as possible for a visit in 1 week(s).   Specialty:  Internal Medicine Why:  To be seen with repeat labs (CBC & CMP). Contact information: [address] [place] [address] [phone]        Corky CraftsVaranasi, Jayadeep S, MD .   Specialties:  Cardiology, Radiology, Interventional Cardiology Contact information: [address] [place] [address] [phone]        [facility]. Schedule an appointment as soon as possible for a visit.   Why:  Patient had an appointment on 4/17 which he missed due to hospitalization.  Please reschedule to be seen in 1 week.         Allergies  Allergen Reactions  . Shrimp [Shellfish Allergy] Anaphylaxis  . Banana Other (See Comments)    Pt gags  . Watermelon Flavor Nausea And Vomiting  . Other Itching    Grapes  . Peanut-Containing Drug Products Itching and Cough  . Almond Oil Itching    Roof of mouth itches  . Sulfa  Antibiotics Itching      Procedures/Studies: Ct Abdomen Pelvis W Contrast  Result Date: 01/30/2019 CLINICAL DATA:  Nausea. Two day history of constipation. Abdominal pain, acute. Cocaine and alcohol use. EXAM: CT ABDOMEN AND PELVIS WITH CONTRAST TECHNIQUE: Multidetector CT imaging of the abdomen and pelvis was performed using the standard protocol following bolus administration of intravenous contrast. CONTRAST:  125mL OMNIPAQUE IOHEXOL 300 MG/ML  SOLN COMPARISON:  CT of the abdomen and pelvis 01/22/2019 and 11/30/18 FINDINGS: Lower chest: Mild ground-glass attenuation of lower lobes bilaterally is consistent with atelectasis. The heart size is normal. No significant pleural or pericardial effusions are present. Hepatobiliary: No focal liver abnormality is seen. No gallstones, gallbladder wall thickening, or biliary dilatation. Pancreas: Inflammatory changes are again noted about the uncinate process the pancreas. This is most prominent medial. There is no mass lesion or cyst.  The distal body and tail are within normal limits. Spleen: Normal in size without focal abnormality. Adrenals/Urinary Tract: Adrenal glands are normal. Kidneys and ureters are within normal limits. There is no stone or mass lesion. No obstruction is present. The urinary bladder is normal. Stomach/Bowel: The stomach and duodenum are within normal limits. Small bowel is unremarkable. Patient is status post appendectomy. Ascending and transverse colon are within normal limits. Descending and sigmoid colon are normal. Vascular/Lymphatic: Atherosclerotic calcifications are present in the aorta and branch vessels without aneurysm or significant stenosis. Is subcentimeter periportal lymph node is stable. Other smaller nodes are likely reactive. Reproductive: Prostate is unremarkable. Other: No abdominal wall hernia or abnormality. No abdominopelvic ascites. Musculoskeletal: Healed right transverse process fractures are noted at L1, L2, and L3.  Vertebral body heights alignment are maintained. No acute fractures are present. Pelvis is within normal limits. The hips are located. IMPRESSION: 1. Inflammatory changes involving the uncinate process of the pancreas consistent with acute pancreatitis. No complicating features are present. 2. Reactive periportal adenopathy. 3.  Aortic Atherosclerosis (ICD10-I70.0). Electronically Signed   By: Marin Roberts [name]   On: 01/30/2019 05:27   Ct Abdomen Pelvis W Contrast  Result Date: 01/22/2019 CLINICAL DATA:  Worsening abdominal pain with nausea for 2 months EXAM: CT ABDOMEN AND PELVIS WITH CONTRAST TECHNIQUE: Multidetector CT imaging of the abdomen and pelvis was performed using the standard protocol following bolus administration of intravenous contrast. CONTRAST:  OMNIPAQUE IOHEXOL 300 MG/ML  SOLN COMPARISON:  11/30/2018 FINDINGS: Lower chest:  No contributory findings. Hepatobiliary: No focal liver abnormality.No evidence of biliary obstruction or stone. Pancreas: Expansion of the uncinate process with adjacent fat edema and mild nodal enlargement. The neighboring duodenum is non thickened. Spleen: Unremarkable. Adrenals/Urinary Tract: Negative adrenals. No hydronephrosis or stone. Unremarkable bladder. Stomach/Bowel:  No obstruction. Appendectomy Vascular/Lymphatic: No acute vascular abnormality. Mild atherosclerotic calcification. No mass or adenopathy. Reproductive:Proximal vas deferens calcification. Other: No ascites or pneumoperitoneum. Musculoskeletal: No acute abnormalities. IMPRESSION: Acute pancreatitis at the uncinate process. No necrosis or collection. Electronically Signed   By: Marnee Spring [name]   On: 01/22/2019 05:05   US Abdomen Limited Ruq  Result Date: 01/22/2019 CLINICAL DATA:  Pancreatitis EXAM: ULTRASOUND ABDOMEN LIMITED RIGHT UPPER QUADRANT COMPARISON:  01/22/2019 FINDINGS: Gallbladder: No gallstones or wall thickening visualized. No sonographic Murphy sign noted by  sonographer. Common bile duct: Diameter: Normal caliber, 6 mm Liver: No focal lesion identified. Within normal limits in parenchymal echogenicity. Portal vein is patent on color Doppler imaging with normal direction of blood flow towards the liver. IMPRESSION: Normal right upper quadrant ultrasound. Electronically Signed   By: Charlett Nose [name]   On: 01/22/2019 10:29      Subjective: Overall feels much better compared to admission.  Abdominal pain has significantly improved.  Still has mild intermittent abdominal pain.  Tolerated low-fat diet.  Passing flatus.  No BM since admission.  No nausea or vomiting.  Denies any other complaints.  Anxious to go home.  As per RN, no acute issues noted.  Discharge Exam:  Vitals:   02/01/19 0531 02/01/19 0532 02/01/19 0820 02/01/19 1424  BP: (!) 135/99  (!) 147/91 127/70  Pulse: 83   69  Resp: 18   20  Temp: 98.4 F (36.9 C)  97.7 F (36.5 C) 97.9 F (36.6 C)  TempSrc: Oral  Oral Oral  SpO2: 98%   100%  Weight:  118.8 kg    Height:        General  exam: Pleasant young male, moderately built and obese lying comfortably propped up in bed without distress.  Oral mucosa moist. Respiratory system: Clear to auscultation. Respiratory effort normal. Cardiovascular system: S1 & S2 heard, RRR. No JVD, murmurs, rubs, gallops or clicks. No pedal edema. Gastrointestinal system: Abdomen is nondistended, soft and nontender. No organomegaly or masses felt. Normal bowel sounds heard. Central nervous system: Alert and oriented. No focal neurological deficits. Extremities: Symmetric 5 x 5 power. Skin: No rashes, lesions or ulcers Psychiatry: Judgement and insight appear normal. Mood & affect appropriate.     The results of significant diagnostics from this hospitalization (including imaging, microbiology, ancillary and laboratory) are listed below for reference.     Labs: CBC: Recent Labs  Lab 01/29/19 2247 01/31/19 0259  WBC 9.8 8.8  HGB 13.8 12.6*   HCT 41.7 37.0*  MCV 82.6 83.1  PLT 362 301   Basic Metabolic Panel: Recent Labs  Lab 01/29/19 2247 01/31/19 0259  NA 135 138  K 4.1 4.5  CL 98 100  CO2 27 33*  GLUCOSE 184* 128*  BUN 21* 12  CREATININE 1.38* 1.14  CALCIUM 9.3 9.5   Liver Function Tests: Recent Labs  Lab 01/29/19 2247 01/31/19 0259  AST 15 16  ALT 14 14  ALKPHOS 63 56  BILITOT 0.8 1.0  PROT 7.4 7.0  ALBUMIN 3.6 3.3*   CBG: Recent Labs  Lab 01/30/19 2059 01/31/19 0755 01/31/19 1708 02/01/19 0740 02/01/19 1152  GLUCAP 118* 115* 136* 164* 125*   Urinalysis    Component Value Date/Time   COLORURINE YELLOW 01/29/2019 2235   APPEARANCEUR CLEAR 01/29/2019 2235   LABSPEC 1.018 01/29/2019 2235   PHURINE 6.0 01/29/2019 2235   GLUCOSEU NEGATIVE 01/29/2019 2235   HGBUR SMALL (A) 01/29/2019 2235   BILIRUBINUR NEGATIVE 01/29/2019 2235   KETONESUR NEGATIVE 01/29/2019 2235   PROTEINUR 100 (A) 01/29/2019 2235   UROBILINOGEN 1.0 11/19/2013 1855   NITRITE NEGATIVE 01/29/2019 2235   LEUKOCYTESUR NEGATIVE 01/29/2019 2235      Time coordinating discharge: 25 minutes  SIGNED:  Marcellus Scott, MD, FACP, [facility]. [facility]  To contact the attending provider between 7A-7P or the covering provider during after hours 7P-7A, please log into the web site [email or web address] and access using universal [place] password for that web site. If you do not have the password, please call the hospital operator.

## 2019-02-01 NOTE — Discharge Instructions (Signed)
Information on my medicine - XARELTO® (Rivaroxaban) ° °Why was Xarelto® prescribed for you? °Xarelto® was prescribed for you to reduce the risk of a blood clot forming that can cause a stroke if you have a medical condition called atrial fibrillation (a type of irregular heartbeat). ° °What do you need to know about xarelto® ? °Take your Xarelto® ONCE DAILY at the same time every day with your evening meal. °If you have difficulty swallowing the tablet whole, you may crush it and mix in applesauce just prior to taking your dose. ° °Take Xarelto® exactly as prescribed by your doctor and DO NOT stop taking Xarelto® without talking to the doctor who prescribed the medication.  Stopping without other stroke prevention medication to take the place of Xarelto® may increase your risk of developing a clot that causes a stroke.  Refill your prescription before you run out. ° °After discharge, you should have regular check-up appointments with your healthcare provider that is prescribing your Xarelto®.  In the future your dose may need to be changed if your kidney function or weight changes by a significant amount. ° °What do you do if you miss a dose? °If you are taking Xarelto® ONCE DAILY and you miss a dose, take it as soon as you remember on the same day then continue your regularly scheduled once daily regimen the next day. Do not take two doses of Xarelto® at the same time or on the same day.  ° °Important Safety Information °A possible side effect of Xarelto® is bleeding. You should call your healthcare provider right away if you experience any of the following: °? Bleeding from an injury or your nose that does not stop. °? Unusual colored urine (red or dark brown) or unusual colored stools (red or black). °? Unusual bruising for unknown reasons. °? A serious fall or if you hit your head (even if there is no bleeding). ° °Some medicines may interact with Xarelto® and might increase your risk of bleeding while on  Xarelto®. To help avoid this, consult your healthcare provider or pharmacist prior to using any new prescription or non-prescription medications, including herbals, vitamins, non-steroidal anti-inflammatory drugs (NSAIDs) and supplements. ° °This website has more information on Xarelto®: [email or web address].com. ° ° ° °Additional discharge instructions ° °Please get your medications reviewed and adjusted by your Primary MD. ° °Please request your Primary MD to go over all Hospital Tests and Procedure/Radiological results at the follow up, please get all Hospital records sent to your Prim MD by signing hospital release before you go home. ° °If you had Pneumonia of Lung problems at the Hospital: °Please get a 2 view Chest X ray done in 6-8 weeks after hospital discharge or sooner if instructed by your Primary MD. ° °If you have Congestive Heart Failure: °Please call your Cardiologist or Primary MD anytime you have any of the following symptoms:  °1) 3 pound weight gain in 24 hours or 5 pounds in 1 week  °2) shortness of breath, with or without a dry hacking cough  °3) swelling in the hands, feet or stomach  °4) if you have to sleep on extra pillows at night in order to breathe ° °Follow cardiac low salt diet and 1.5 lit/day fluid restriction. ° °If you have diabetes °Accuchecks 4 times/day, Once in AM empty stomach and then before each meal. °Log in all results and show them to your primary doctor at your next visit. °If any glucose reading is under 80 or above 300   call your primary MD immediately. ° °If you have Seizure/Convulsions/Epilepsy: °Please do not drive, operate heavy machinery, participate in activities at heights or participate in high speed sports until you have seen by Primary MD or a Neurologist and advised to do so again. ° °If you had Gastrointestinal Bleeding: °Please ask your Primary MD to check a complete blood count within one week of discharge or at your next visit. Your endoscopic/colonoscopic biopsies  that are pending at the time of discharge, will also need to followed by your Primary MD. ° °Get Medicines reviewed and adjusted. °Please take all your medications with you for your next visit with your Primary MD ° °Please request your Primary MD to go over all hospital tests and procedure/radiological results at the follow up, please ask your Primary MD to get all Hospital records sent to his/her office. ° °If you experience worsening of your admission symptoms, develop shortness of breath, life threatening emergency, suicidal or homicidal thoughts you must seek medical attention immediately by calling 911 or calling your MD immediately  if symptoms less severe. ° °You must read complete instructions/literature along with all the possible adverse reactions/side effects for all the Medicines you take and that have been prescribed to you. Take any new Medicines after you have completely understood and accpet all the possible adverse reactions/side effects.  ° °Do not drive or operate heavy machinery when taking Pain medications.  ° °Do not take more than prescribed Pain, Sleep and Anxiety Medications ° °Special Instructions: If you have smoked or chewed Tobacco  in the last 2 yrs please stop smoking, stop any regular Alcohol  and or any Recreational drug use. ° °Wear Seat belts while driving. ° °Please note °You were cared for by a hospitalist during your hospital stay. If you have any questions about your discharge medications or the care you received while you were in the hospital after you are discharged, you can call the unit and asked to speak with the hospitalist on call if the hospitalist that took care of you is not available. Once you are discharged, your primary care physician will handle any further medical issues. Please note that NO REFILLS for any discharge medications will be authorized once you are discharged, as it is imperative that you return to your primary care physician (or establish a  relationship with a primary care physician if you do not have one) for your aftercare needs so that they can reassess your need for medications and monitor your lab values. ° °You can reach the hospitalist office at phone [phone] or fax [phone] °  °If you do not have a primary care physician, you can call [phone] for a physician referral. ° ° °

## 2019-02-03 DIAGNOSIS — K8500 Idiopathic acute pancreatitis without necrosis or infection: Secondary | ICD-10-CM | POA: Diagnosis not present

## 2019-02-03 DIAGNOSIS — E1169 Type 2 diabetes mellitus with other specified complication: Secondary | ICD-10-CM | POA: Diagnosis not present

## 2019-02-04 ENCOUNTER — Telehealth: Payer: Self-pay | Admitting: Internal Medicine

## 2019-02-04 DIAGNOSIS — G4733 Obstructive sleep apnea (adult) (pediatric): Secondary | ICD-10-CM

## 2019-02-04 NOTE — Telephone Encounter (Signed)
Called and spoke with pt who stated he is wanting to switch DME companies due to needing a new plug for CPAP and pt was told by DME that he owed them money and due to this, they are not providing pt with the new plug for his cpap or any other supplies.  Pt stated he is currently with [facility] but he used to be with [facility]. Pt is wanting to switch back to [facility]. Dr. Maple Hudson, please advise if you are okay with [place] placing order to have pt's  DME switched. Thanks!

## 2019-02-04 NOTE — Telephone Encounter (Signed)
DME order placed to switch companies from [place] to [facility] for support and supplies. Patient aware Nothing further needed.

## 2019-02-04 NOTE — Telephone Encounter (Signed)
Ok to switch 

## 2019-02-05 LAB — GLUCOSE, CAPILLARY
Glucose-Capillary: 156 mg/dL — ABNORMAL HIGH (ref 70–99)
Glucose-Capillary: 131 mg/dL — ABNORMAL HIGH (ref 70–99)
Glucose-Capillary: 161 mg/dL — ABNORMAL HIGH (ref 70–99)
Glucose-Capillary: 146 mg/dL — ABNORMAL HIGH (ref 70–99)

## 2019-02-07 ENCOUNTER — Ambulatory Visit: Payer: 59 | Admitting: Podiatry

## 2019-02-18 DIAGNOSIS — I10 Essential (primary) hypertension: Secondary | ICD-10-CM | POA: Diagnosis not present

## 2019-02-18 DIAGNOSIS — G4733 Obstructive sleep apnea (adult) (pediatric): Secondary | ICD-10-CM | POA: Diagnosis not present

## 2019-02-22 ENCOUNTER — Other Ambulatory Visit: Payer: Self-pay | Admitting: Interventional Cardiology

## 2019-02-22 DIAGNOSIS — I480 Paroxysmal atrial fibrillation: Secondary | ICD-10-CM

## 2019-02-22 DIAGNOSIS — I119 Hypertensive heart disease without heart failure: Secondary | ICD-10-CM

## 2019-03-11 DIAGNOSIS — H3563 Retinal hemorrhage, bilateral: Secondary | ICD-10-CM | POA: Diagnosis not present

## 2019-03-11 DIAGNOSIS — E1169 Type 2 diabetes mellitus with other specified complication: Secondary | ICD-10-CM | POA: Diagnosis not present

## 2019-03-11 DIAGNOSIS — E113491 Type 2 diabetes mellitus with severe nonproliferative diabetic retinopathy without macular edema, right eye: Secondary | ICD-10-CM | POA: Diagnosis not present

## 2019-03-11 DIAGNOSIS — H35031 Hypertensive retinopathy, right eye: Secondary | ICD-10-CM | POA: Diagnosis not present

## 2019-03-11 DIAGNOSIS — E113492 Type 2 diabetes mellitus with severe nonproliferative diabetic retinopathy without macular edema, left eye: Secondary | ICD-10-CM | POA: Diagnosis not present

## 2019-03-11 DIAGNOSIS — Z794 Long term (current) use of insulin: Secondary | ICD-10-CM | POA: Diagnosis not present

## 2019-03-19 ENCOUNTER — Emergency Department (HOSPITAL_COMMUNITY)
Admission: EM | Admit: 2019-03-19 | Discharge: 2019-03-19 | Disposition: A | Discharge status: Home / Self Care | Payer: 59 | Attending: Emergency Medicine | Admitting: Emergency Medicine

## 2019-03-19 ENCOUNTER — Other Ambulatory Visit: Payer: Self-pay

## 2019-03-19 ENCOUNTER — Encounter (HOSPITAL_COMMUNITY): Payer: Self-pay | Admitting: *Deleted

## 2019-03-19 DIAGNOSIS — Z79899 Other long term (current) drug therapy: Secondary | ICD-10-CM | POA: Insufficient documentation

## 2019-03-19 DIAGNOSIS — H539 Unspecified visual disturbance: Secondary | ICD-10-CM | POA: Insufficient documentation

## 2019-03-19 DIAGNOSIS — F17200 Nicotine dependence, unspecified, uncomplicated: Secondary | ICD-10-CM | POA: Insufficient documentation

## 2019-03-19 DIAGNOSIS — E119 Type 2 diabetes mellitus without complications: Secondary | ICD-10-CM | POA: Diagnosis not present

## 2019-03-19 DIAGNOSIS — R42 Dizziness and giddiness: Secondary | ICD-10-CM | POA: Insufficient documentation

## 2019-03-19 DIAGNOSIS — Z7984 Long term (current) use of oral hypoglycemic drugs: Secondary | ICD-10-CM | POA: Diagnosis not present

## 2019-03-19 DIAGNOSIS — Z7901 Long term (current) use of anticoagulants: Secondary | ICD-10-CM | POA: Diagnosis not present

## 2019-03-19 DIAGNOSIS — Z91010 Allergy to peanuts: Secondary | ICD-10-CM | POA: Insufficient documentation

## 2019-03-19 DIAGNOSIS — I10 Essential (primary) hypertension: Secondary | ICD-10-CM | POA: Insufficient documentation

## 2019-03-19 LAB — CBG MONITORING, ED: Glucose-Capillary: 83 mg/dL (ref 70–99)

## 2019-03-19 LAB — CBC
HCT: 38 % — ABNORMAL LOW (ref 39.0–52.0)
Hemoglobin: 12.3 g/dL — ABNORMAL LOW (ref 13.0–17.0)
MCH: 28.7 pg (ref 26.0–34.0)
MCHC: 32.4 g/dL (ref 30.0–36.0)
MCV: 88.6 fL (ref 80.0–100.0)
Platelets: 273 10*3/uL (ref 150–400)
RBC: 4.29 MIL/uL (ref 4.22–5.81)
RDW: 15.3 % (ref 11.5–15.5)
WBC: 8.4 10*3/uL (ref 4.0–10.5)
nRBC: 0 % (ref 0.0–0.2)

## 2019-03-19 LAB — URINALYSIS, ROUTINE W REFLEX MICROSCOPIC
Bilirubin Urine: NEGATIVE
Glucose, UA: NEGATIVE mg/dL
Hgb urine dipstick: NEGATIVE
Ketones, ur: NEGATIVE mg/dL
Leukocytes,Ua: NEGATIVE
Nitrite: NEGATIVE
Protein, ur: NEGATIVE mg/dL
Specific Gravity, Urine: 1.02 (ref 1.005–1.030)
pH: 5 (ref 5.0–8.0)

## 2019-03-19 LAB — BASIC METABOLIC PANEL
Anion gap: 8 (ref 5–15)
BUN: 33 mg/dL — ABNORMAL HIGH (ref 6–20)
CO2: 20 mmol/L — ABNORMAL LOW (ref 22–32)
Calcium: 8.9 mg/dL (ref 8.9–10.3)
Chloride: 111 mmol/L (ref 98–111)
Creatinine, Ser: 1.14 mg/dL (ref 0.61–1.24)
GFR calc Af Amer: >60 mL/min (ref >60)
GFR calc non Af Amer: >60 mL/min (ref >60)
Glucose, Bld: 87 mg/dL (ref 70–99)
Potassium: 4.9 mmol/L (ref 3.5–5.1)
Sodium: 139 mmol/L (ref 135–145)

## 2019-03-19 MED ORDER — BLOOD PRESSURE MONITOR/L CUFF MISC: 1.0000 [IU] | Freq: Every day | 0 refills | Status: DC

## 2019-03-19 MED ORDER — SODIUM CHLORIDE 0.9% FLUSH: 3.0000 mL | Freq: Once | INTRAVENOUS | Status: DC

## 2019-03-19 NOTE — ED Provider Notes (Signed)
[facility]   CSN: [id number] Arrival date & time: 03/19/19  0808    History   Chief Complaint Chief Complaint  Patient presents with  . Dizziness    HPI [name] is a 48 y.o. male.     48 year old male with history of diabetes and hypertension presents with complaint of feeling dizzy off and on for the past 6 years.  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  Patient had a stress test and had similar symptoms and was told that it was due to a drop in his blood pressure.  Patient also reports feeling like things appear brighter to him, described as feeling like his eyes have been dilated.  Patient was seen by his ophthalmologist and told that he had damage to his eyes from his blood pressure.  Patient does not have any symptoms or complaints at this time.     Past Medical History:  Diagnosis Date  . Diabetes mellitus   . GERD (gastroesophageal reflux disease)   . History of alcohol abuse   . History of cocaine use   . History of echocardiogram    a. Echo 4/14: Moderate LVH, vigorous LVEF, EF 65-70%, normal wall motion, grade 2 diastolic dysfunction, mildly dilated aortic root and ascending aorta, ascending aorta 40 mm, aortic root 38 mm, mild LAE  . Hx of cardiovascular stress test    a. GXT 5/14: No ischemic changes  //  b. ETT-Myoview 3/16:  Low risk, no ischemia, EF 58%  . Hyperlipidemia   . Hypertension   . Morbid obesity (HCC)   . Obesity   . Paroxysmal atrial fibrillation (HCC)    Occurring in 2008, with several recurrence since then (including in the setting of + cocaine on UDS).  . Sleep apnea     Patient Active Problem List   Diagnosis Date Noted  . Acute pancreatitis 01/22/2019  . Class 3 obesity due to excess calories with serious comorbidity and body mass index (BMI) of  40.0 to 44.9 in adult 12/08/2016  . PAF (paroxysmal atrial fibrillation) (HCC) 01/23/2013  . Diabetes mellitus (HCC) 01/23/2013  . Hypertensive heart disease 01/23/2013  . Tobacco user 01/23/2013  . Chronic diastolic heart failure (HCC) 01/21/2013  . 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 Shoulder pain, right 02/15/2011  . Seasonal and perennial allergic rhinitis 02/13/2008  . Dyslipidemia 02/12/2008  . OBESITY, MORBID 02/12/2008  . COCAINE Abuse, past hx 02/12/2008  . Obstructive sleep apnea 02/12/2008    Past Surgical History:  Procedure Laterality Date  . APPENDECTOMY    . ROTATOR CUFF REPAIR    . TONSILLECTOMY          Home Medications    Prior to Admission medications   Medication Sig Start Date End Date Taking? Authorizing Provider  Blood Pressure Monitoring (BLOOD PRESSURE MONITOR/L CUFF) MISC 1 Units by Does not apply route daily. 03/19/19   Jeannie FendMurphy,  A, PA-C  flecainide (TAMBOCOR) 100 MG tablet TAKE 1 TABLET BY MOUTH TWICE A DAY 02/25/19   Corky CraftsVaranasi, Jayadeep S, MD  glipiZIDE (GLUCOTROL) 10 MG tablet Take 10 mg by mouth 2 (two) times daily. 01/06/19   [provider]  lisinopril (PRINIVIL,ZESTRIL) 20 MG tablet Take 1 tablet (20 mg total) by mouth daily for 30 days. 01/24/19 02/23/19  Glade LloydAlekh, Kshitiz, MD  metFORMIN (GLUCOPHAGE) 500 MG  tablet Take 1,000 mg by mouth 2 (two) times daily. 01/06/19   [provider]  metoprolol succinate (TOPROL-XL) 25 MG 24 hr tablet TAKE 1 TABLET BY MOUTH EVERY DAY 02/25/19   Corky Crafts, MD  ondansetron (ZOFRAN) 4 MG tablet Take 1 tablet (4 mg total) by mouth every 6 (six) hours as needed for nausea. 01/24/19   Glade Lloyd, MD  oxyCODONE (OXY IR/ROXICODONE) 5 MG immediate release tablet Take 1 tablet (5 mg total) by mouth every 6 (six) hours as needed for moderate pain. 01/24/19   Glade Lloyd, MD  pantoprazole (PROTONIX) 40 MG tablet Take 1 tablet (40 mg total) by mouth daily. 01/24/19   Glade Lloyd, MD   rivaroxaban (XARELTO) 20 MG TABS tablet TAKE 1 TABLET (20 MG TOTAL) BY MOUTH DAILY WITH SUPPER. Patient taking differently: Take 20 mg by mouth daily with supper.  01/15/19   Corky Crafts, MD    Family History Family History  Problem Relation Age of Onset  . Diabetes Mother   . Heart attack Father 43  . Stroke Maternal Grandmother   . Stroke Paternal Grandmother   . Diabetes Paternal Grandfather     Social History Social History   Tobacco Use  . Smoking status: Current Every Day Smoker    Packs/day: 1.00    Years: 28.00    Pack years: 28.00  . Smokeless tobacco: Former Neurosurgeon    Types: Snuff  . Tobacco comment: smokes one pack per day  Substance Use Topics  . Alcohol use: Yes    Comment: occasionally - 1 beer 1x/[place]  . Drug use: No    Comment: cocaine in the past, none currently     Allergies   Shrimp [shellfish allergy]; Banana; Watermelon flavor; Other; Peanut-containing drug products; Almond oil; and Sulfa antibiotics   Review of Systems Review of Systems  Constitutional: Negative for fever.  Eyes: Positive for visual disturbance. Negative for photophobia.  Respiratory: Negative for shortness of breath.   Cardiovascular: Negative for chest pain.  Gastrointestinal: Negative for nausea and vomiting.  Musculoskeletal: Negative for arthralgias and myalgias.  Skin: Negative for rash and wound.  Allergic/Immunologic: Negative for immunocompromised state.  Neurological: Positive for dizziness.  Psychiatric/Behavioral: Negative for confusion.  All other systems reviewed and are negative.    Physical Exam Updated Vital Signs BP 125/80 (BP Location: Right Arm)   Pulse 71   Temp 98.2 F (36.8 C) (Oral)   Resp 12   SpO2 100%   Physical Exam Vitals signs and nursing note reviewed.  Constitutional:      General: He is not in acute distress.    Appearance: He is well-developed. He is not diaphoretic.  HENT:     Head: Normocephalic and atraumatic.      Mouth/Throat:     Mouth: Mucous membranes are dry.  Eyes:     Extraocular Movements: Extraocular movements intact.     Conjunctiva/sclera: Conjunctivae normal.     Pupils: Pupils are equal, round, and reactive to light.  Cardiovascular:     Rate and Rhythm: Normal rate and regular rhythm.     Pulses: Normal pulses.     Heart sounds: Normal heart sounds. No murmur.  Pulmonary:     Effort: Pulmonary effort is normal.     Breath sounds: Normal breath sounds.  Skin:    General: Skin is warm and dry.     Findings: No erythema or rash.  Neurological:     General: No focal deficit present.  Mental Status: He is alert and oriented to person, place, and time.  Psychiatric:        Behavior: Behavior normal.      ED Treatments / Results  Labs (all labs ordered are listed, but only abnormal results are displayed) Labs Reviewed  BASIC METABOLIC PANEL - Abnormal; Notable for the following components:      Result Value   CO2 20 (*)    BUN 33 (*)    All other components within normal limits  CBC - Abnormal; Notable for the following components:   Hemoglobin 12.3 (*)    HCT 38.0 (*)    All other components within normal limits  URINALYSIS, ROUTINE W REFLEX MICROSCOPIC  CBG MONITORING, ED    EKG None  Radiology No results found.  Procedures Procedures (including critical care time)  Medications Ordered in ED Medications  sodium chloride flush (NS) 0.9 % injection 3 mL (3 mLs Intravenous Not Given 03/19/19 1148)     Initial Impression / Assessment and Plan / ED Course  I have reviewed the triage vital signs and the nursing notes.  Pertinent labs & imaging results that were available during my care of the patient were reviewed by me and considered in my medical decision making (see chart for details).  Clinical Course as of Mar 19 1235  Wed Mar 19, 2019  1232 48yo male with complaint of feeling lightheaded upon standing occasionally for the past 6 years. Also complaint of  feeling like he sees everything brighter than normal at times. Patient is not having any of there symptoms at this time. Orthostatic VS normal, CBC with hgb 12.3, BMP with Co2 20 and BUN 33, CBG normal at 83. Exam is unremarkable. Patient has been told previously these symptoms are due to low blood pressure episodes and eye damage from his HTN. Patient has not checked his blood sugar or BP when he is feeling unwell. Given rx for BP monitor, request patient check his BP and CBG, record results and follow up with PCP. He eats fruit cocktail with his lunch, advised this may be causing a spike in his blood sugar. Patient will monitor and follow up with PCP.    [LM]    Clinical Course User Index [LM] Jeannie Fend, PA-C      Final Clinical Impressions(s) / ED Diagnoses   Final diagnoses:  Lightheadedness  Visual disturbance    ED Discharge Orders         Ordered    Blood Pressure Monitoring (BLOOD PRESSURE MONITOR/L CUFF) MISC  Daily     03/19/19 1229           Jeannie Fend, PA-C 03/19/19 1236    Charlynne Pander, MD 03/20/19 [phone]

## 2019-03-19 NOTE — ED Notes (Addendum)
CBG obtained: 83mg /dL

## 2019-03-19 NOTE — ED Triage Notes (Signed)
To ED for eval of dizziness and 'looking at the world with my eyes wide open'. States this has been happening 'for years'. Getting worse per pt. States primary mds telling him its due to htn and dm meds. Describes feeling orthostatic at times. States he is drinking water and cbg's have been 'normal'.

## 2019-03-19 NOTE — Discharge Instructions (Addendum)
Monitor your blood pressure and blood sugar when you are feeling well and when you are having your symptoms. Take your readings to your doctor for follow up.

## 2019-03-19 NOTE — ED Notes (Signed)
Attempted to provide urine, unable at this time, aware of need for sample, urine specimen cup provided

## 2019-04-22 ENCOUNTER — Telehealth: Payer: Self-pay | Admitting: Internal Medicine

## 2019-04-22 NOTE — Telephone Encounter (Signed)
Call made to patient, he states he wanted to be sure we were able to see his compliance report on airview or if he needed to bring his cpap machine when he came in. I made this patient aware we were able to see his report on airview and he does not need to bring in his machine. He states this is the first time he has worn his machine and he is very proud of himself. Encouraged him to keep up the good work. Voiced understanding. Nothing further is needed at this time.

## 2019-05-11 ENCOUNTER — Encounter: Payer: Self-pay | Admitting: Internal Medicine

## 2019-05-12 ENCOUNTER — Other Ambulatory Visit: Payer: Self-pay

## 2019-05-12 ENCOUNTER — Ambulatory Visit (INDEPENDENT_AMBULATORY_CARE_PROVIDER_SITE_OTHER): Payer: 59 | Admitting: Internal Medicine

## 2019-05-12 ENCOUNTER — Encounter: Payer: Self-pay | Admitting: Internal Medicine

## 2019-05-12 DIAGNOSIS — Z720 Tobacco use: Secondary | ICD-10-CM

## 2019-05-12 DIAGNOSIS — G4733 Obstructive sleep apnea (adult) (pediatric): Secondary | ICD-10-CM

## 2019-05-12 DIAGNOSIS — G47411 Narcolepsy with cataplexy: Secondary | ICD-10-CM

## 2019-05-12 NOTE — Assessment & Plan Note (Signed)
He is benefiting from CPAP and recognizes he sleeps restfully using it. We discussed goals and medical impact. Residual AHI at 8.5/ hr, but machine is not reaching its pressure limit, so we will accept this. 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  He understands that like any driver, it is his responsibility to be awake and alert for safe driving.  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

## 2019-05-12 NOTE — Progress Notes (Signed)
HPI  male smoker followed for management of OSA and narcolepsy with cataplexy, complicated by allergic rhinitis, atrial fibrillation, dCHF, DM, HTN NPSG 11/23/00- AHI 20 per hour. Hypnagogic hallucination associated with dreaming. 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 Vivid dreams as soon as he falls asleep Multiple Sleep Latency Test 10/30/2012-pathologic daytime hypersomnia, nonspecific, compatible with idiopathic hypersomnia or narcolepsy. Mean latency 0.9 minutes with one sleep onset REM event  ---------------------------------------------------------------------------------------  11/05/17-  48 year old male smoker followed for management of OSA and narcolepsy with cataplexy, complicated by allergic rhinitis, atrial fibrillation, dCHF, DM2, HTN CPAP 13/[facility] -----6 month ROV  "Can't afford BP meds so hasn't taken in last few days", but able to afford 1 ppd cigarettes. Discussed. Adderall XR 30 mg,  Blames poor fitting nasal mask for not using CPAP regularly.  No download available to verify but I think CPAP is 13.  He says he has been sleeping well at night without sleep medicines.  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  He says he has no trouble staying awake as long as he is "engaged", including driving.  If he sits quietly with nothing to do he may doze off pretty quickly.  He does not recall any recent cataplexy type events.  Adderall continues to help and he denies palpitation, headache or concerns.  05/12/2019- 48 year old male Smoker followed for management of OSA and narcolepsy with cataplexy, complicated by allergic rhinitis, atrial fibrillation/Xarelto flecainide, dCHF, Diastolic Dysfunction, DM2, HTN, Pancreatitis, Obessity CPAP 4-20/[facility]  Pressure range 11- 17.2 Download 83% compliance, AHI 8.5/ hr Body weight today- 293 lbs -----OSA on CPAP 4-20; DME: [facility], no complaints He has been compliant with CPAP  and reports sleeping very well using full face mask. Denies daytime sleepiness now. No longer needs Adderall.  We did discuss naps and caffeine if ever needed.   ROS-see HPI  + = positive Constitutional:   No-   weight loss, night sweats, fevers, chills,  +fatigue, lassitude. HEENT:   No-  headaches, difficulty swallowing, tooth/dental problems, sore throat,       No-  sneezing, itching, ear ache, + nasal congestion, post nasal drip,  CV:  No-   chest pain, orthopnea, PND, swelling in lower extremities, anasarca, dizziness, palpitations Resp: No-   shortness of breath with exertion or at rest.              No-   productive cough,  No non-productive cough,  No- coughing up of blood.              No-   change in color of mucus.  No- wheezing.   Skin: No-   rash or lesions. GI:  No-   heartburn, indigestion, abdominal pain, nausea, vomiting,  GU:  MS:  No-   joint pain or swelling.  Neuro-     +HPI Psych:  No- change in mood or affect. No depression or anxiety.  No memory loss.  OBJ- Physical Exam   General- Alert/ calm, Oriented, Affect-appropriate, Distress- none acute, +obese,  Skin- rash-none, lesions- none, excoriation- none Lymphadenopathy- none Head- atraumatic            Eyes- Gross vision intact, PERRLA, conjunctivae and secretions clear            Ears- Hearing, canals-normal            Nose- rhinitis/ turbinate edema, no-Septal dev, mucus, polyps, erosion, perforation  Throat- Mallampati III , mucosa clear , drainage- none, tonsils- atrophic Neck- flexible , trachea midline, no stridor , thyroid nl, carotid no bruit Chest - symmetrical excursion , unlabored           Heart/CV- RRR , no murmur , no gallop  , no rub, nl s1 s2                           - JVD- none , edema- none, stasis changes- none, varices- none           Lung- clear to P&A, wheeze- none, cough- none , dullness-none, rub- none           Chest wall-  Abd-  Br/ Gen/ Rectal- Not done, not  indicated Extrem- cyanosis- none, clubbing, none, atrophy- none, strength- nl

## 2019-05-12 NOTE — Patient Instructions (Signed)
We can continue CPAP auto 4-20, mask of choice, humidifier, supplies, AirView  Please try harder to stop smoking. You don't need ore health problems  Please call if we can help

## 2019-05-12 NOTE — Assessment & Plan Note (Signed)
EDS much improved with regular use of CPAP. Not needing Adderall. Discussed occasional nap and caffeine.

## 2019-05-12 NOTE — Assessment & Plan Note (Signed)
Strongly advise he work to quit this. Support available.

## 2019-07-08 ENCOUNTER — Encounter (HOSPITAL_COMMUNITY): Payer: Self-pay | Admitting: *Deleted

## 2019-07-08 ENCOUNTER — Observation Stay (HOSPITAL_COMMUNITY)
Admission: EM | Admit: 2019-07-08 | Discharge: 2019-07-09 | Disposition: A | Discharge status: Home / Self Care | Payer: 59 | Attending: Interventional Cardiology | Admitting: Interventional Cardiology

## 2019-07-08 ENCOUNTER — Emergency Department (HOSPITAL_COMMUNITY): Payer: 59

## 2019-07-08 ENCOUNTER — Observation Stay (HOSPITAL_BASED_OUTPATIENT_CLINIC_OR_DEPARTMENT_OTHER): Payer: 59

## 2019-07-08 DIAGNOSIS — Z7901 Long term (current) use of anticoagulants: Secondary | ICD-10-CM | POA: Insufficient documentation

## 2019-07-08 DIAGNOSIS — Z79899 Other long term (current) drug therapy: Secondary | ICD-10-CM | POA: Insufficient documentation

## 2019-07-08 DIAGNOSIS — G4733 Obstructive sleep apnea (adult) (pediatric): Secondary | ICD-10-CM | POA: Diagnosis not present

## 2019-07-08 DIAGNOSIS — I471 Supraventricular tachycardia: Secondary | ICD-10-CM

## 2019-07-08 DIAGNOSIS — I509 Heart failure, unspecified: Secondary | ICD-10-CM

## 2019-07-08 DIAGNOSIS — R7989 Other specified abnormal findings of blood chemistry: Secondary | ICD-10-CM

## 2019-07-08 DIAGNOSIS — I480 Paroxysmal atrial fibrillation: Secondary | ICD-10-CM | POA: Diagnosis not present

## 2019-07-08 DIAGNOSIS — I110 Hypertensive heart disease with heart failure: Secondary | ICD-10-CM | POA: Insufficient documentation

## 2019-07-08 DIAGNOSIS — R000 Tachycardia, unspecified: Secondary | ICD-10-CM | POA: Diagnosis present

## 2019-07-08 DIAGNOSIS — E119 Type 2 diabetes mellitus without complications: Secondary | ICD-10-CM | POA: Insufficient documentation

## 2019-07-08 DIAGNOSIS — Z882 Allergy status to sulfonamides status: Secondary | ICD-10-CM | POA: Diagnosis not present

## 2019-07-08 DIAGNOSIS — I082 Rheumatic disorders of both aortic and tricuspid valves: Secondary | ICD-10-CM | POA: Insufficient documentation

## 2019-07-08 DIAGNOSIS — I5033 Acute on chronic diastolic (congestive) heart failure: Secondary | ICD-10-CM | POA: Insufficient documentation

## 2019-07-08 DIAGNOSIS — I4892 Unspecified atrial flutter: Secondary | ICD-10-CM | POA: Insufficient documentation

## 2019-07-08 DIAGNOSIS — N179 Acute kidney failure, unspecified: Secondary | ICD-10-CM | POA: Diagnosis not present

## 2019-07-08 DIAGNOSIS — R778 Other specified abnormalities of plasma proteins: Secondary | ICD-10-CM

## 2019-07-08 LAB — URINALYSIS, ROUTINE W REFLEX MICROSCOPIC
Bilirubin Urine: NEGATIVE
Glucose, UA: NEGATIVE mg/dL
Ketones, ur: NEGATIVE mg/dL
Leukocytes,Ua: NEGATIVE
Nitrite: NEGATIVE
Protein, ur: 30 mg/dL — AB
Specific Gravity, Urine: 1.019 (ref 1.005–1.030)
pH: 5 (ref 5.0–8.0)

## 2019-07-08 LAB — CBC
HCT: 39.6 % (ref 39.0–52.0)
Hemoglobin: 13.4 g/dL (ref 13.0–17.0)
MCH: 29.6 pg (ref 26.0–34.0)
MCHC: 33.8 g/dL (ref 30.0–36.0)
MCV: 87.4 fL (ref 80.0–100.0)
Platelets: 257 10*3/uL (ref 150–400)
RBC: 4.53 MIL/uL (ref 4.22–5.81)
RDW: 13.4 % (ref 11.5–15.5)
WBC: 10 10*3/uL (ref 4.0–10.5)
nRBC: 0 % (ref 0.0–0.2)

## 2019-07-08 LAB — BASIC METABOLIC PANEL
Anion gap: 10 (ref 5–15)
BUN: 25 mg/dL — ABNORMAL HIGH (ref 6–20)
CO2: 23 mmol/L (ref 22–32)
Calcium: 8.7 mg/dL — ABNORMAL LOW (ref 8.9–10.3)
Chloride: 106 mmol/L (ref 98–111)
Creatinine, Ser: 1.87 mg/dL — ABNORMAL HIGH (ref 0.61–1.24)
GFR calc Af Amer: 48 mL/min — ABNORMAL LOW (ref >60)
GFR calc non Af Amer: 42 mL/min — ABNORMAL LOW (ref >60)
Glucose, Bld: 148 mg/dL — ABNORMAL HIGH (ref 70–99)
Potassium: 4.9 mmol/L (ref 3.5–5.1)
Sodium: 139 mmol/L (ref 135–145)

## 2019-07-08 LAB — MAGNESIUM: Magnesium: 1.8 mg/dL (ref 1.7–2.4)

## 2019-07-08 LAB — RAPID URINE DRUG SCREEN, HOSP PERFORMED
Amphetamines: NOT DETECTED
Barbiturates: NOT DETECTED
Benzodiazepines: NOT DETECTED
Cocaine: NOT DETECTED
Opiates: NOT DETECTED
Tetrahydrocannabinol: NOT DETECTED

## 2019-07-08 LAB — ETHANOL: Alcohol, Ethyl (B): <10 mg/dL (ref <10)

## 2019-07-08 LAB — TROPONIN I (HIGH SENSITIVITY)
Troponin I (High Sensitivity): 651 ng/L (ref <18)
Troponin I (High Sensitivity): 758 ng/L (ref <18)
Troponin I (High Sensitivity): 719 ng/L (ref <18)

## 2019-07-08 LAB — TSH: TSH: 1.491 u[IU]/mL (ref 0.350–4.500)

## 2019-07-08 LAB — GLUCOSE, CAPILLARY: Glucose-Capillary: 101 mg/dL — ABNORMAL HIGH (ref 70–99)

## 2019-07-08 LAB — ECHOCARDIOGRAM COMPLETE

## 2019-07-08 LAB — BRAIN NATRIURETIC PEPTIDE: B Natriuretic Peptide: 534 pg/mL — ABNORMAL HIGH (ref 0.0–100.0)

## 2019-07-08 IMAGING — CR DG CHEST 2V
2 series · 2 of 2 positions shown · non-contrast
Comparison: PA and lateral chest [DATE] and [DATE].

CLINICAL DATA: Onset shortness of breath and chest pain last night.

EXAM:
CHEST - 2 VIEW

[chest pa]
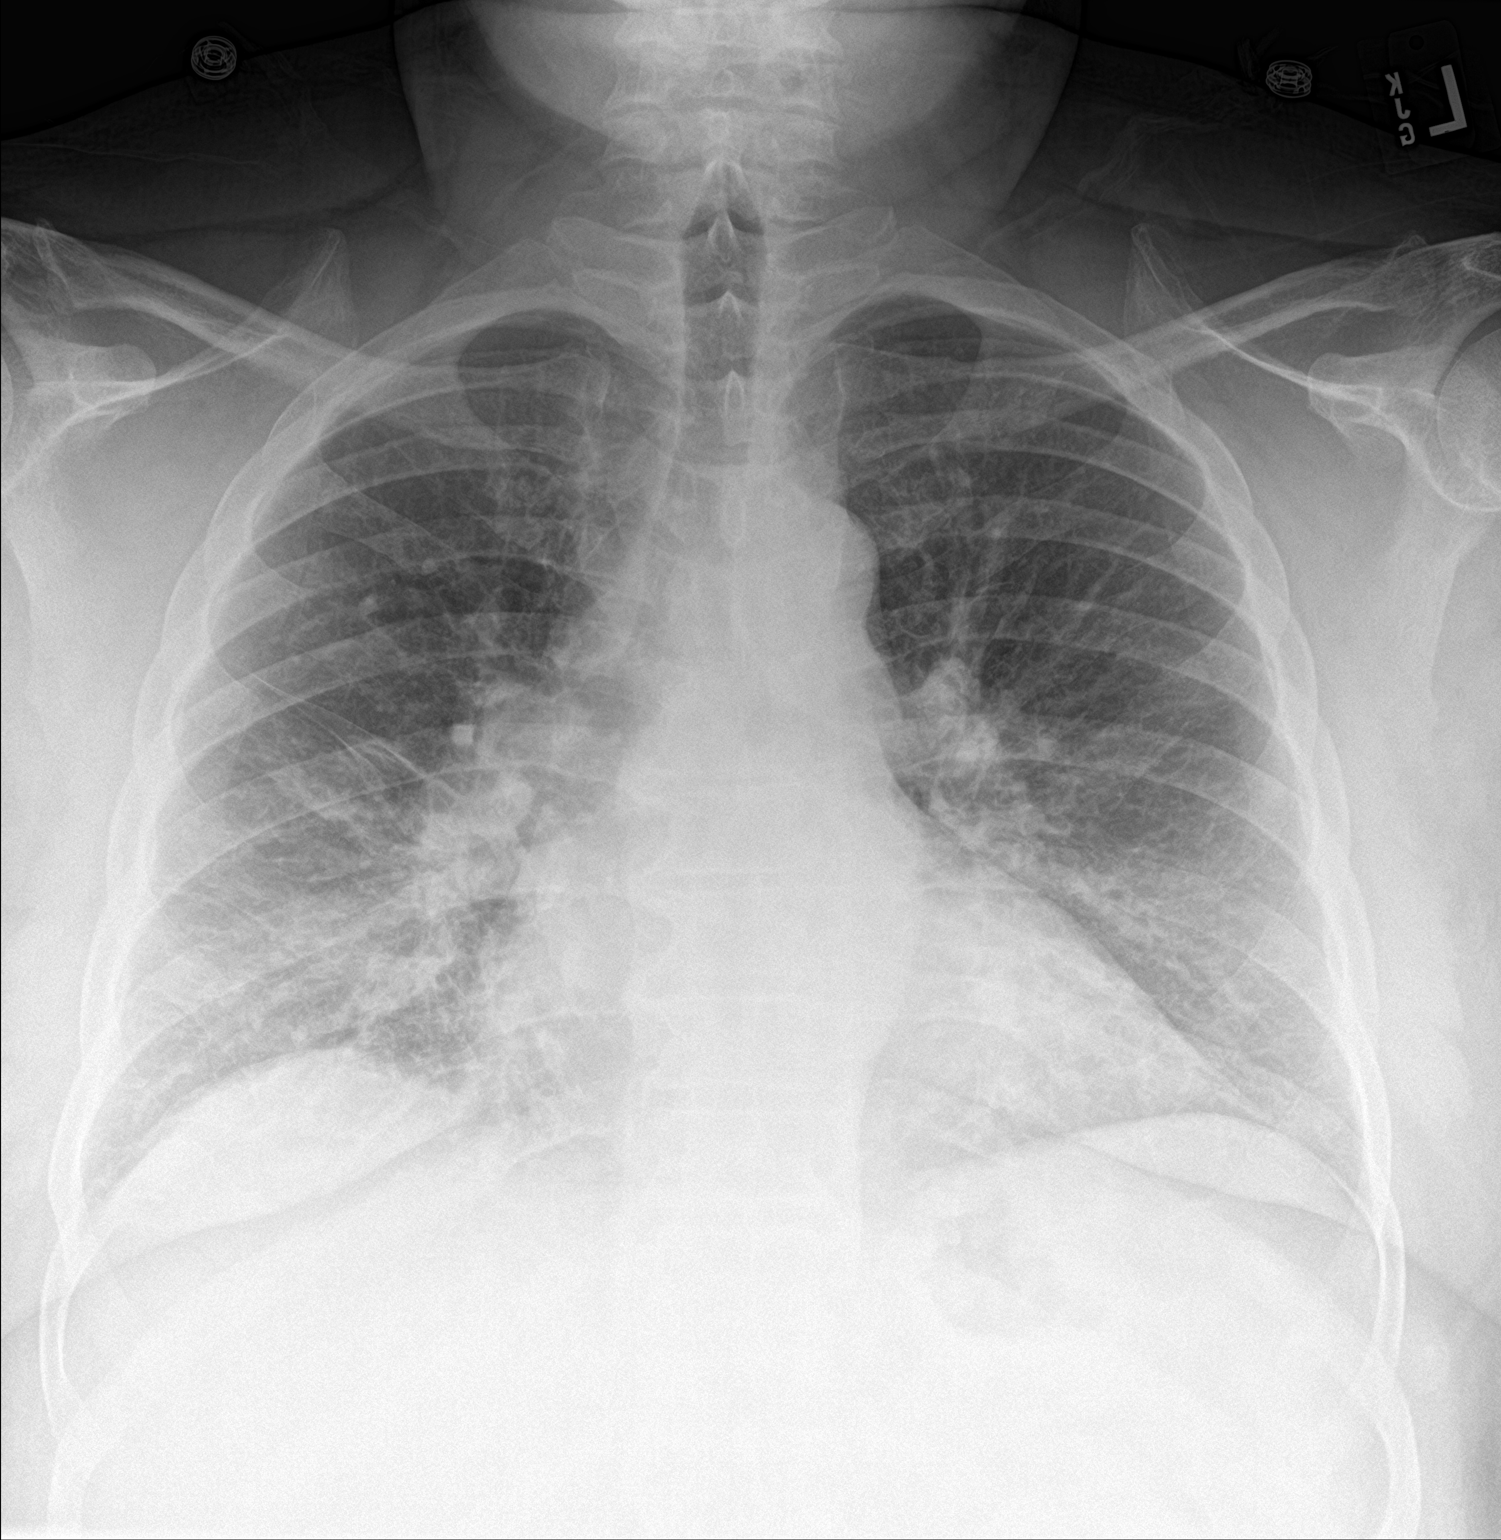

[chest lat]
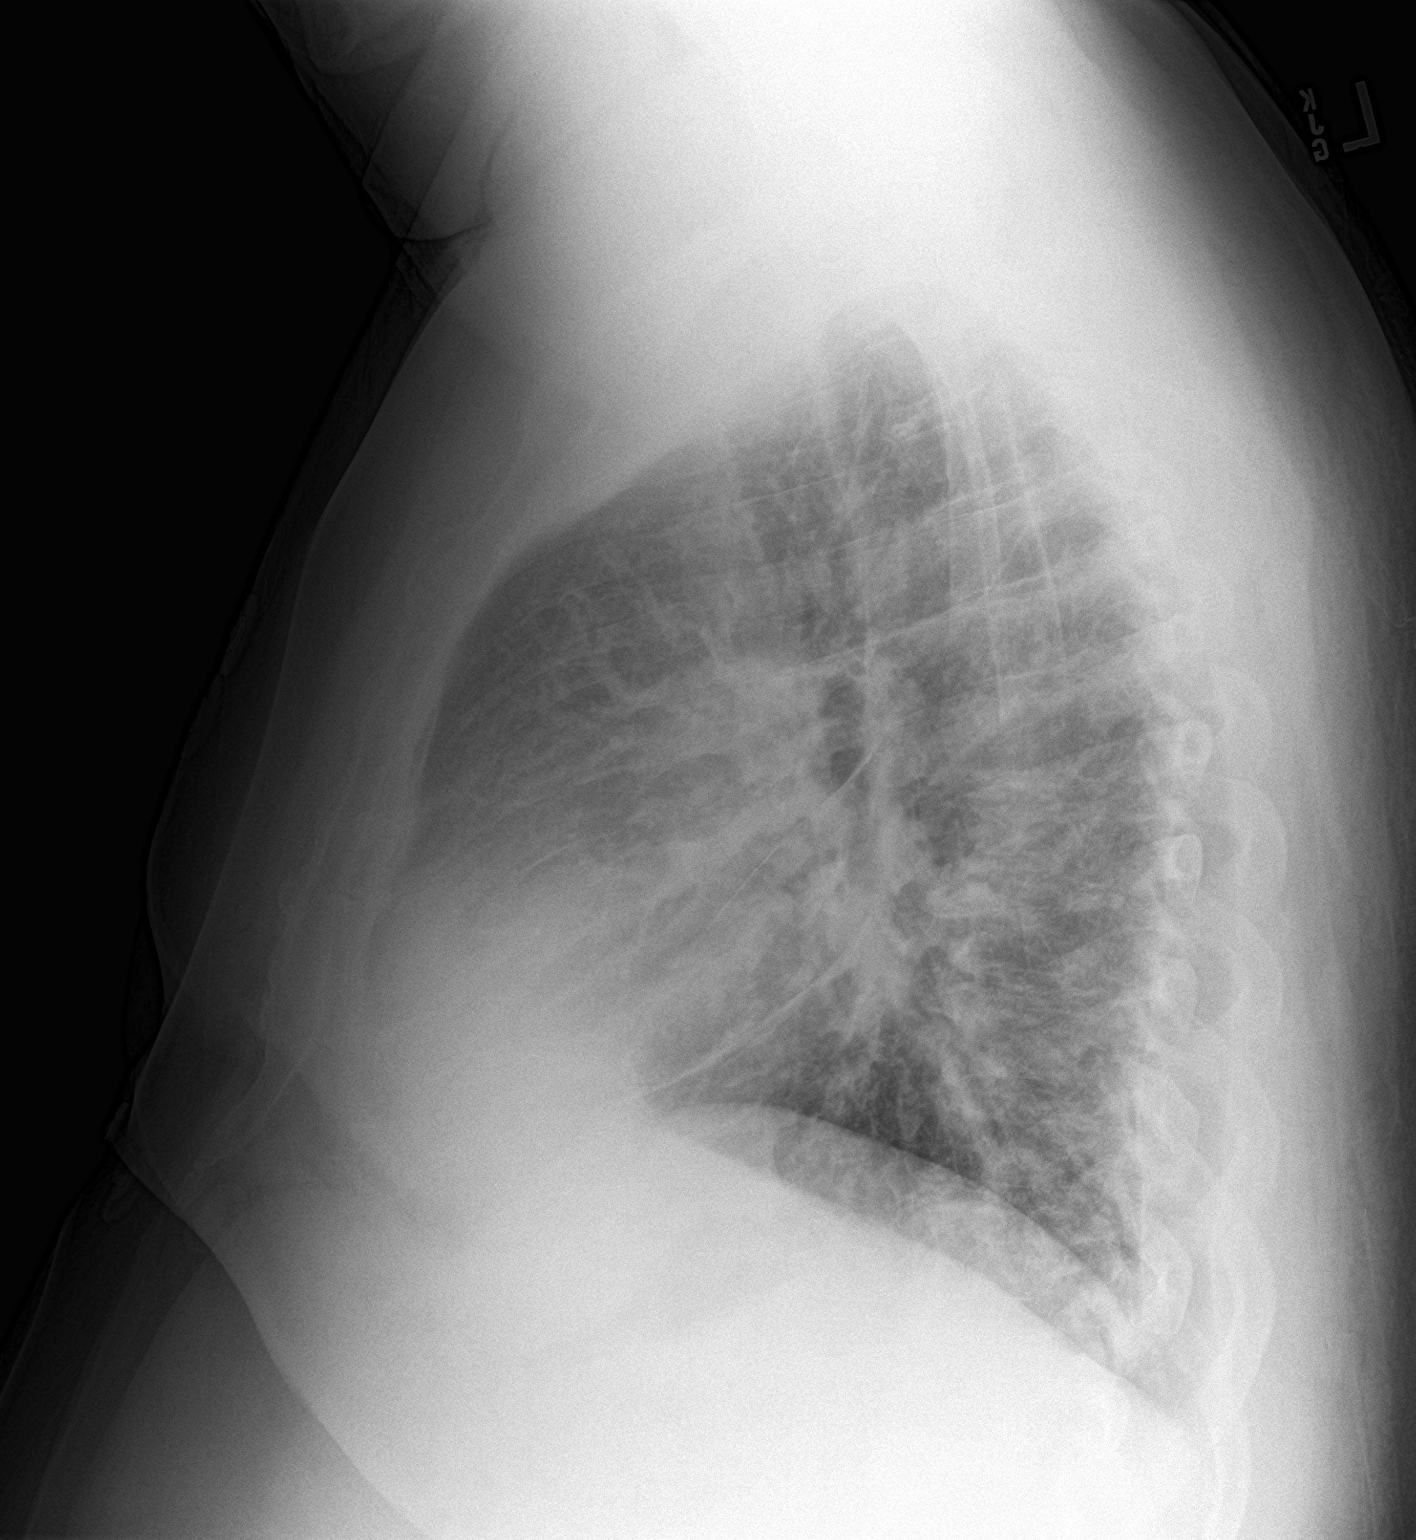

[2 of 2 positions shown; findings below may reference images not displayed]

FINDINGS: Hazy bilateral pulmonary opacities and peribronchial thickening are
most consistent with pulmonary edema. Heart size is mildly enlarged.
No pneumothorax or pleural fluid. No acute or focal bony
abnormality.
IMPRESSION: Appearance of the chest most consistent with pulmonary edema.

## 2019-07-08 MED ORDER — SODIUM CHLORIDE 0.9% FLUSH
3.0000 mL | Freq: Once | INTRAVENOUS | Status: AC
  Administered 2019-07-08: 10 mL via INTRAVENOUS

## 2019-07-08 MED ORDER — METOPROLOL SUCCINATE ER 25 MG PO TB24: 25.0000 mg | ORAL_TABLET | Freq: Once | ORAL | Status: DC

## 2019-07-08 MED ORDER — FLECAINIDE ACETATE 100 MG PO TABS
100.0000 mg | ORAL_TABLET | Freq: Two times a day (BID) | ORAL | Status: DC
  Administered 2019-07-08 – 2019-07-09 (×2): 100 mg via ORAL
  Filled 2019-07-08 (×3): qty 1

## 2019-07-08 MED ORDER — SODIUM CHLORIDE 0.9 % IV BOLUS
250.0000 mL | Freq: Once | INTRAVENOUS | Status: AC
  Administered 2019-07-08: 250 mL via INTRAVENOUS

## 2019-07-08 MED ORDER — ADENOSINE 6 MG/2ML IV SOLN
6.0000 mg | Freq: Once | INTRAVENOUS | Status: AC
  Administered 2019-07-08: 6 mg via INTRAVENOUS
  Filled 2019-07-08: qty 2

## 2019-07-08 MED ORDER — FLECAINIDE ACETATE 100 MG PO TABS
100.0000 mg | ORAL_TABLET | Freq: Once | ORAL | Status: AC
  Administered 2019-07-08: 100 mg via ORAL
  Filled 2019-07-08: qty 1

## 2019-07-08 MED ORDER — GLIPIZIDE 10 MG PO TABS
10.0000 mg | ORAL_TABLET | Freq: Two times a day (BID) | ORAL | Status: DC
  Administered 2019-07-08: 10 mg via ORAL
  Filled 2019-07-08 (×2): qty 1

## 2019-07-08 MED ORDER — SODIUM CHLORIDE 0.9 % IV BOLUS (SEPSIS)
1000.0000 mL | Freq: Once | INTRAVENOUS | Status: DC
  Administered 2019-07-08: 1000 mL via INTRAVENOUS

## 2019-07-08 MED ORDER — DILTIAZEM HCL-DEXTROSE 100-5 MG/100ML-% IV SOLN (PREMIX)
5.0000 mg/h | INTRAVENOUS | Status: DC
  Administered 2019-07-08 – 2019-07-09 (×2): 5 mg/h via INTRAVENOUS
  Filled 2019-07-08 (×3): qty 100

## 2019-07-08 MED ORDER — GLIPIZIDE 10 MG PO TABS
10.0000 mg | ORAL_TABLET | Freq: Two times a day (BID) | ORAL | Status: DC
  Administered 2019-07-09: 10 mg via ORAL
  Filled 2019-07-08 (×2): qty 1

## 2019-07-08 MED ORDER — FUROSEMIDE 10 MG/ML IJ SOLN
20.0000 mg | Freq: Once | INTRAMUSCULAR | Status: AC
  Administered 2019-07-08: 20 mg via INTRAVENOUS
  Filled 2019-07-08: qty 2

## 2019-07-08 MED ORDER — DILTIAZEM LOAD VIA INFUSION
10.0000 mg | Freq: Once | INTRAVENOUS | Status: AC
  Administered 2019-07-08: 10 mg via INTRAVENOUS
  Filled 2019-07-08: qty 10

## 2019-07-08 MED ORDER — ASPIRIN 81 MG PO CHEW
324.0000 mg | CHEWABLE_TABLET | Freq: Once | ORAL | Status: AC
  Administered 2019-07-08: 324 mg via ORAL
  Filled 2019-07-08: qty 4

## 2019-07-08 MED ORDER — SODIUM CHLORIDE 0.9 % IV SOLN
INTRAVENOUS | Status: AC
  Administered 2019-07-08: 17:00:00 via INTRAVENOUS

## 2019-07-08 MED ORDER — METOPROLOL SUCCINATE ER 25 MG PO TB24
25.0000 mg | ORAL_TABLET | Freq: Every day | ORAL | Status: DC
  Administered 2019-07-09: 25 mg via ORAL
  Filled 2019-07-08: qty 1

## 2019-07-08 MED ORDER — RIVAROXABAN 20 MG PO TABS
20.0000 mg | ORAL_TABLET | Freq: Every day | ORAL | Status: DC
  Administered 2019-07-08: 20 mg via ORAL
  Filled 2019-07-08 (×2): qty 1

## 2019-07-08 MED ORDER — ADENOSINE 6 MG/2ML IV SOLN
12.0000 mg | Freq: Once | INTRAVENOUS | Status: AC
  Administered 2019-07-08: 12 mg via INTRAVENOUS

## 2019-07-08 NOTE — ED Provider Notes (Addendum)
TIME SEEN: 7:03 AM  CHIEF COMPLAINT: Shortness of breath; palpitations  HPI: Patient is a 48 year old male with history of obesity, hypertension, paroxysmal atrial fibrillation on flecainide, metoprolol, Xarelto, alcohol and cocaine abuse, diabetes who presents to the emergency department shortness of breath that started at 6 PM last night.  States he wears CPAP for his sleep apnea and states while wearing the CPAP he still felt like he "could not catch a good breath".  Denies chest pain or chest discomfort.  He denies fevers, cough, vomiting or diarrhea.  He denies any recent drug or alcohol use.  No stimulant use.  No increased caffeine intake.  He has not taken his morning dose of flecainide, metoprolol.  Did not miss any other medications.  Denies history of CHF.  No history of PE, DVT, exogenous estrogen use, recent fractures, surgery, trauma, hospitalization or prolonged travel. No lower extremity swelling or pain. No calf tenderness.  Reports last alcohol use was December 2019.  Last cocaine use 3 months ago.   PCP - Dr. Nehemiah Settle with [facility]  Cardiologist - Dr. Eldridge Dace   Exercise tolerance test 02/08/2018:   Patient walked for 5 minutes and 30 seconds. He achieved a peak heart rate of 151 which is 87% predicted maximal heart rate. Blood pressure demonstrated a hypertensive response to exercise.  There were no ST or T wave changes to suggest ischemia. There was a premature ventricular contraction during the recovery phase.  This is interpreted as a negative treadmill test.     ROS: See HPI Constitutional: no fever  Eyes: no drainage  ENT: no runny nose   Cardiovascular:  no chest pain  Resp:  SOB  GI: no vomiting GU: no dysuria Integumentary: no rash  Allergy: no hives  Musculoskeletal: no leg swelling  Neurological: no slurred speech ROS otherwise negative  PAST MEDICAL HISTORY/PAST SURGICAL HISTORY:  Past Medical History:  Diagnosis Date  . Diabetes mellitus   . GERD  (gastroesophageal reflux disease)   . History of alcohol abuse   . History of cocaine use   . History of echocardiogram    a. Echo 4/14: Moderate LVH, vigorous LVEF, EF 65-70%, normal wall motion, grade 2 diastolic dysfunction, mildly dilated aortic root and ascending aorta, ascending aorta 40 mm, aortic root 38 mm, mild LAE  . Hx of cardiovascular stress test    a. GXT 5/14: No ischemic changes  //  b. ETT-Myoview 3/16:  Low risk, no ischemia, EF 58%  . Hyperlipidemia   . Hypertension   . Morbid obesity (HCC)   . Obesity   . Paroxysmal atrial fibrillation (HCC)    Occurring in 2008, with several recurrence since then (including in the setting of + cocaine on UDS).  . Sleep apnea     MEDICATIONS:  Prior to Admission medications   Medication Sig Start Date End Date Taking? Authorizing Provider  Blood Pressure Monitoring (BLOOD PRESSURE MONITOR/L CUFF) MISC 1 Units by Does not apply route daily. 03/19/19   Jeannie Fend, PA-C  flecainide (TAMBOCOR) 100 MG tablet TAKE 1 TABLET BY MOUTH TWICE A DAY 02/25/19   Corky Crafts, MD  glipiZIDE (GLUCOTROL) 10 MG tablet Take 10 mg by mouth 2 (two) times daily. 01/06/19   [provider]  lisinopril (PRINIVIL,ZESTRIL) 20 MG tablet Take 1 tablet (20 mg total) by mouth daily for 30 days. 01/24/19 02/23/19  Glade Lloyd, MD  metFORMIN (GLUCOPHAGE) 500 MG tablet Take 1,000 mg by mouth 2 (two) times daily. 01/06/19  [provider]  metoprolol succinate (TOPROL-XL) 25 MG 24 hr tablet TAKE 1 TABLET BY MOUTH EVERY DAY 02/25/19   Jettie Booze, MD  ondansetron (ZOFRAN) 4 MG tablet Take 1 tablet (4 mg total) by mouth every 6 (six) hours as needed for nausea. 01/24/19   Aline August, MD  rivaroxaban (XARELTO) 20 MG TABS tablet TAKE 1 TABLET (20 MG TOTAL) BY MOUTH DAILY WITH SUPPER. Patient taking differently: Take 20 mg by mouth daily with supper.  01/15/19   Jettie Booze, MD    ALLERGIES:  Allergies  Allergen Reactions   . Shrimp [Shellfish Allergy] Anaphylaxis  . Banana Other (See Comments)    Pt gags  . Watermelon Flavor Nausea And Vomiting  . Peanut-Containing Drug Products Itching and Cough  . Almond Oil Itching    Roof of mouth itches  . Sulfa Antibiotics Itching    SOCIAL HISTORY:  Social History   Tobacco Use  . Smoking status: Current Every Day Smoker    Packs/day: 1.00    Years: 28.00    Pack years: 28.00  . Smokeless tobacco: Former Systems developer    Types: Snuff  . Tobacco comment: smokes one pack per day 05/12/2019  Substance Use Topics  . Alcohol use: Yes    Comment: occasionally - 1 beer 1x/mont    FAMILY HISTORY: Family History  Problem Relation Age of Onset  . Diabetes Mother   . Heart attack Father 27  . Stroke Maternal Grandmother   . Stroke Paternal Grandmother   . Diabetes Paternal Grandfather     EXAM: BP (!) 84/57 (BP Location: Right Arm)   Pulse (!) 144   Temp 97.8 F (36.6 C) (Oral)   Resp 20   SpO2 98%  CONSTITUTIONAL: Alert and oriented and responds appropriately to questions. Well-appearing; well-nourished HEAD: Normocephalic EYES: Conjunctivae clear, pupils appear equal, EOMI ENT: normal nose; moist mucous membranes NECK: Supple, no meningismus, no nuchal rigidity, no LAD  CARD: Regular and tachycardic; S1 and S2 appreciated; no murmurs, no clicks, no rubs, no gallops RESP: Normal chest excursion without splinting or tachypnea; breath sounds clear and equal bilaterally; no wheezes, no rhonchi, no rales, no hypoxia or respiratory distress, speaking full sentences ABD/GI: Normal bowel sounds; non-distended; soft, non-tender, no rebound, no guarding, no peritoneal signs, no hepatosplenomegaly BACK:  The back appears normal and is non-tender to palpation, there is no CVA tenderness EXT: Normal ROM in all joints; non-tender to palpation; no edema; normal capillary refill; no cyanosis, no calf tenderness or swelling    SKIN: Normal color for age and race; warm; no  rash NEURO: Moves all extremities equally PSYCH: The patient's mood and manner are appropriate. Grooming and personal hygiene are appropriate.  MEDICAL DECISION MAKING: Patient here with tachycardia.  EKG pending.  Initially hypotensive in triage but blood pressure is now 100/70 and patient mentating well.  Will start IV fluids.  Will obtain labs, chest x-ray.  Patient may need admission.  He reports he has never been cardioverted.  Will order his dose of home metoprolol and flecainide as well.  ED PROGRESS: Pt's EKG shows a very irregular narrow complex tachycardia.  This could be sinus tachycardia versus SVT versus atrial flutter.  Will give IV adenosine.  Chest x-ray shows pulmonary edema.  Will stop further IV hydration.  Patient received less than 100 mL total.  Last echocardiogram was in 2014.  Signed out to Dr. Tomi Bamberger to follow-up on patient's results and reassess vital signs.  EKG Interpretation  Date/Time:  Tuesday July 08 2019 07:10:11 EDT Ventricular Rate:  148 PR Interval:    QRS Duration: 81 QT Interval:  308 QTC Calculation: 484 R Axis:   -7 Text Interpretation:  Sinus tachycardia versus SVT Probable anteroseptal infarct, recent Confirmed by Rochele Raring [phone]) on 07/08/2019 7:22:59 AM      [name] was evaluated in Emergency Department on 07/08/2019 for the symptoms described in the history of present illness. He was evaluated in the context of the global COVID-19 pandemic, which necessitated consideration that the patient might be at risk for infection with the SARS-CoV-2 virus that causes COVID-19. Institutional protocols and algorithms that pertain to the evaluation of patients at risk for COVID-19 are in a state of rapid change based on information released by regulatory bodies including the CDC and federal and state organizations. These policies and algorithms were followed during the patient's care in the ED.     Savanah Bayles, Layla Maw, DO 07/08/19  0724    Phillipa Morden, Layla Maw, DO 07/08/19 [id number]

## 2019-07-08 NOTE — ED Provider Notes (Addendum)
Patient initially seen by Dr. Elesa Massed.  Please see her note.  Patient presented to the ED with complaints of shortness of breath.  Patient denies any trouble with chest pain.  He denies any trouble with fever.  No complaints.  Patient states he has been taking his medication. Physical Exam  BP (!) 125/99   Pulse 76   Temp 97.8 F (36.6 C) (Oral)   Resp 20   SpO2 100%   Physical Exam Vitals signs and nursing note reviewed.  Constitutional:      Appearance: He is well-developed. He is not toxic-appearing.  HENT:     Head: Normocephalic and atraumatic.     Right Ear: External ear normal.     Left Ear: External ear normal.  Eyes:     General: No scleral icterus.       Right eye: No discharge.        Left eye: No discharge.     Conjunctiva/sclera: Conjunctivae normal.  Neck:     Musculoskeletal: Neck supple.     Trachea: No tracheal deviation.  Cardiovascular:     Rate and Rhythm: Regular rhythm. Tachycardia present.  Pulmonary:     Effort: Pulmonary effort is normal. No respiratory distress.     Breath sounds: No stridor. No decreased breath sounds.  Abdominal:     General: There is no distension.  Musculoskeletal:        General: No swelling or deformity.  Skin:    General: Skin is warm and dry.     Findings: No rash.  Neurological:     Mental Status: He is alert.     Cranial Nerves: Cranial nerve deficit: no gross deficits.     ED Course/Procedures   Clinical Course as of Jul 07 1020  Tue Jul 08, 2019  3557 Patient was given 6 mg of adenosine followed by 12 mg hydralazine.  Was a brief pause in his tachycardia.  P waves were visible.     [JK]  [id number] Patient is improved after Cardizem infusion.  Heart rate is now down in the 70s.  Appears to have a sinus rhythm on repeat EKG.   [JK]  1002 Labs reviewed.  Troponin elevated to 651.  BNP also elevated at 534.   [JK]  1002 Chest x-ray suggestive of pulmonary edema.   [JK]  1021 D/w Rosann Auerbach, cardiology will evaluate pt   [JK]    Clinical Course User Index [JK] Linwood Dibbles, MD    .Critical Care Performed by: Linwood Dibbles, MD Authorized by: Linwood Dibbles, MD   Critical care provider statement:    Critical care time (minutes):  35   Critical care was time spent personally by me on the following activities:  Discussions with consultants, evaluation of patient's response to treatment, examination of patient, ordering and performing treatments and interventions, ordering and review of laboratory studies, ordering and review of radiographic studies, pulse oximetry, re-evaluation of patient's condition, obtaining history from patient or surrogate and review of old charts    MDM  Patient's tachycardia resolved with Cardizem infusion.  He appears to be back in a sinus rhythm at this time.  His ED work-up is notable for findings suggestive of congestive heart failure.  He also has a significantly elevated troponin.  He denies any chest pain.  Acute coronary syndrome is a concern although this may be related to his tachycardia and CHF.  Patient is followed by the cardiology clinic.  I will consult with cardiology for admission and further  treatment.      Dorie Rank, MD 07/08/19 1006    Dorie Rank, MD 07/08/19 1021

## 2019-07-08 NOTE — ED Notes (Signed)
Pt ambulatory to bathroom

## 2019-07-08 NOTE — ED Triage Notes (Signed)
To ED via pov for eval of sob and heart racing. Pt states he 'has been fighting with my air all night'. Unable to use c-pap when went to bed due to feeling so sob. Pt bp noted to by systolic in 33I and rechecked with same reading.

## 2019-07-08 NOTE — H&P (Addendum)
Cardiology H&P Note:   Patient ID: [name] MRN: [id number]; DOB: 06-May-1971  Admit date: 07/08/2019 Date of Consult: 07/08/2019  Primary Care [name]: Douglas Dills, MD Primary Cardiologist: Douglas Muss, MD  Primary Electrophysiologist:  None   Patient Profile:   [name] is a 48 y.o. male with a hx of PAF on Flecainide and Xarelto, obesity, OSA on CPAP, HTN, T2DM and prior h/o ETOH/cocaine use, who is being seen today for the evaluation of tachycardia, acute CHF and elevated troponin, at the request of Dr. Lynelle [facility], Emergency Medicine.   History of Present Illness:   [name] is a 48 y/o male w/ a hx of HTN, DM2, obesity, OSA on CPAP, prior ETOH/cocaine use and PAF. Remotely seen by Dr. Reyes [facility] in the past. He was evaluated by Dr. Delane [facility] in 4/14 in the [facility] during an admission with PAF with RVR.Ritalin was adrug tobe avoided in the futuresince the AFib started with medicine. He converted to NSR and long-term anticoagulation was recommended given his stroke risk. Placed on Xarelto. Flecainide was added to maintain NSR. He was to follow up in  office for ETT to rule out pro-arrhythmia, but was not seen here until 2/16. Review of his chart indicates he had an ETT at [name] in 02/2013. He was placed back on Flecainide and FU ETT-Myoview was low risk and neg for pro-arrhythmia. he has since been followed by Dr. Eldridge [facility]. His last visit was a telehealth visit 01/15/19. He was doing well at that time w/o cardiac symptoms and denied any symptoms of breakthrough afib.  He had also needed clearance for a colonoscopy. He was cleared and given ok to hold Xarelto for procedure but procedure postponed due to COVID pandemic.  Pt presented to the [name] early morning hours w/ complaint of SOB and palpitations.  Patient reports that he felt fine most of the day yesterday but felt a little tired.  Last night while attempting to sleep P had shortness of breath even while  wearing CPAP.  Had to sit up on the side of the bed to catch his breath.  Also felt palpitations.  No chest pain.  Given his symptoms, he came to the ED for evaluation.  In the ED, initial EKG showed sinus tachycardia ? Atrial flutter. Hs troponin elevated at 651. BNP 534. CXR w/ findings c/w pulmonary edema. BMP c/w AKI. SCr elevated at 1.87. Baseline Scr 1.1. K normal at 4.9. Mg normal at 1.8. CBC normal. Afebrile. Ethanol test negative. UDS also negative. UA negative for UTI.    He was initially given adenosine then started on IV Cardizem drip. Converted to NSR.  Heart rate in the 70s.  Cardiology now asked to evaluate.    Patient reports that his palpitations feel similar to what he previously experienced with atrial fibrillation.  He has also noticed exertional dyspnea which is new and also similar to prior symptoms with A. fib.  He reports full medication compliance.  No missed doses of Xarelto in the last 30 days.  Also no missed doses of flecainide.  He drinks a moderate amount of caffeine daily, mainly soft drinks.  No other stimulants.  He has not used Adderall in more than a year.  He reports normal p.o. intake.  He did have a bout of diarrhea over the weekend which has resolved.  He sleeps with a CPAP nightly.   Heart Pathway Score:     Past Medical History:  Diagnosis Date  . Diabetes mellitus   .  GERD (gastroesophageal reflux disease)   . History of alcohol abuse   . History of cocaine use   . History of echocardiogram    a. Echo 4/14: Moderate LVH, vigorous LVEF, EF 65-70%, normal wall motion, grade 2 diastolic dysfunction, mildly dilated aortic root and ascending aorta, ascending aorta 40 mm, aortic root 38 mm, mild LAE  . Hx of cardiovascular stress test    a. GXT 5/14: No ischemic changes  //  b. ETT-Myoview 3/16:  Low risk, no ischemia, EF 58%  . Hyperlipidemia   . Hypertension   . Morbid obesity ([name])   . Obesity   . Paroxysmal atrial fibrillation ([name])    Occurring in  2008, with several recurrence since then (including in the setting of + cocaine on UDS).  . Sleep apnea     Past Surgical History:  Procedure Laterality Date  . APPENDECTOMY    . ROTATOR CUFF REPAIR    . TONSILLECTOMY       Home Medications:  Prior to Admission medications   Medication Sig Start Date End Date Taking? Authorizing [name]  flecainide (TAMBOCOR) 100 MG tablet TAKE 1 TABLET BY MOUTH TWICE A DAY Patient taking differently: Take 100 mg by mouth 2 (two) times daily.  02/25/19  Yes Douglas Booze, MD  glipiZIDE (GLUCOTROL) 10 MG tablet Take 10 mg by mouth 2 (two) times daily. 01/06/19  Yes [name], Historical, MD  lisinopril (PRINIVIL,ZESTRIL) 20 MG tablet Take 1 tablet (20 mg total) by mouth daily for 30 days. 01/24/19 07/08/19 Yes Douglas August, MD  metFORMIN (GLUCOPHAGE) 500 MG tablet Take 1,000 mg by mouth 2 (two) times daily. 01/06/19  Yes [name], Historical, MD  metoprolol succinate (TOPROL-XL) 25 MG 24 hr tablet TAKE 1 TABLET BY MOUTH EVERY DAY Patient taking differently: Take 25 mg by mouth daily.  02/25/19  Yes Douglas Booze, MD  rivaroxaban (XARELTO) 20 MG TABS tablet TAKE 1 TABLET (20 MG TOTAL) BY MOUTH DAILY WITH SUPPER. Patient taking differently: Take 20 mg by mouth daily with supper.  01/15/19  Yes Douglas Booze, MD  Blood Pressure Monitoring (BLOOD PRESSURE MONITOR/L CUFF) MISC 1 Units by Does not apply route daily. 03/19/19   Douglas Learn, [facility]  ondansetron (ZOFRAN) 4 MG tablet Take 1 tablet (4 mg total) by mouth every 6 (six) hours as needed for nausea. Patient not taking: Reported on 07/08/2019 01/24/19   Douglas August, MD    Inpatient Medications: Scheduled Meds:  Continuous Infusions: . diltiazem (CARDIZEM) infusion 5 mg/hr (07/08/19 0822)   PRN Meds:   Allergies:    Allergies  Allergen Reactions  . Shrimp [Shellfish Allergy] Anaphylaxis  . Banana Other (See Comments)    Pt gags  . Watermelon Flavor Nausea And Vomiting  .  Peanut-Containing Drug Products Itching and Cough  . Almond Oil Itching    Roof of mouth itches  . Sulfa Antibiotics Itching    Social History:   Social History   Socioeconomic History  . Marital status: Single    Spouse name: Not on file  . Number of children: 0  . Years of education: 73  . Highest education level: Not on file  Occupational History  . Occupation: Merchandiser, retail: UNEMPLOYED  Social Needs  . Financial resource strain: Not on file  . Food insecurity    Worry: Not on file    Inability: Not on file  . Transportation needs    Medical: Not on file    Non-medical:  Not on file  Tobacco Use  . Smoking status: Current Every Day Smoker    Packs/day: 1.00    Years: 28.00    Pack years: 28.00  . Smokeless tobacco: Former Neurosurgeon    Types: Snuff  . Tobacco comment: smokes one pack per day 05/12/2019  Substance and Sexual Activity  . Alcohol use: Yes    Comment: occasionally - 1 beer 1x/mont  . Drug use: No    Comment: cocaine in the past, none currently  . Sexual activity: Not on file  Lifestyle  . Physical activity    Days per week: Not on file    Minutes per session: Not on file  . Stress: Not on file  Relationships  . Social Musician on phone: Not on file    Gets together: Not on file    Attends religious service: Not on file    Active member of club or organization: Not on file    Attends meetings of clubs or organizations: Not on file    Relationship status: Not on file  . Intimate partner violence    Fear of current or ex partner: Not on file    Emotionally abused: Not on file    Physically abused: Not on file    Forced sexual activity: Not on file  Other Topics Concern  . Not on file  Social History Narrative   Fun: Photographer     Family History:    Family History  Problem Relation Age of Onset  . Diabetes Mother   . Heart attack Father 39  . Stroke Maternal Grandmother   . Stroke Paternal Grandmother   . Diabetes  Paternal Grandfather      ROS:  Please see the history of present illness.   All other ROS reviewed and negative.     Physical Exam/Data:   Vitals:   07/08/19 0815 07/08/19 0830 07/08/19 0900 07/08/19 0945  BP: (!) 82/54 101/69 119/82 (!) 125/99  Pulse: (!) 150 74 76 76  Resp: (!) 21 (!) 22 (!) 22 20  Temp:      TempSrc:      SpO2: 92% 96% 95% 100%    Intake/Output Summary (Last 24 hours) at 07/08/2019 1315 Last data filed at 07/08/2019 0908 Gross per 24 hour  Intake 250 ml  Output -  Net 250 ml   Last 3 Weights 05/12/2019 02/01/2019 01/31/2019  Weight (lbs) 293 lb 9.6 oz 262 lb 262 lb 8 oz  Weight (kg) 133.176 kg 118.842 kg 119.069 kg     There is no height or weight on file to calculate BMI.  General: Super morbidly obese young African-American male well nourished, well developed, in no acute distress HEENT: normal Lymph: no adenopathy Neck: no JVD Endocrine:  No thryomegaly Vascular: No carotid bruits; FA pulses 2+ bilaterally without bruits  Cardiac: RRR.  No murmurs Lungs:  clear to auscultation bilaterally, no wheezing, rhonchi or rales  Abd: Obese abdomen, soft, nontender, no hepatomegaly  Ext: no edema Musculoskeletal:  No deformities, BUE and BLE strength normal and equal Skin: warm and dry  Neuro:  CNs 2-12 intact, no focal abnormalities noted Psych:  Normal affect   EKG:  Initial EKG showed SVT 148 bpm. Repeat EKG showed NSR HR 70s.  Telemetry:  Telemetry was personally reviewed and demonstrates: NSR 70s  Relevant CV Studies: 2D echo 01/2013 Study Conclusions   - Left ventricle: The cavity size was normal. Wall thickness  was increased in  a pattern of moderate LVH. Systolic  function was vigorous. The estimated ejection fraction was  in the range of 65% to 70%. Wall motion was normal; there  were no regional wall motion abnormalities. Features are  consistent with a pseudonormal left ventricular filling  pattern, with concomitant abnormal  relaxation and  increased filling pressure (grade 2 diastolic  dysfunction).  - Aortic valve: There was no stenosis.  - Aorta: Mildly dilated aortic root and ascending aorta.  Ascending aorta dimension: 40 mm. Aortic root dimension:  38mm (ED).  - Mitral valve: No significant regurgitation.  - Left atrium: The atrium was mildly dilated.  - Right ventricle: The cavity size was normal. Systolic  function was normal.  - Pulmonary arteries: No complete TR doppler jet so unable  to estimate [facility] systolic pressure.  - Inferior vena cava: The vessel was normal in size; the  respirophasic diameter changes were in the normal range (=  50%); findings are consistent with normal central venous  pressure.   Laboratory Data:  High Sensitivity Troponin:   Recent Labs  Lab 07/08/19 0814  TROPONINIHS 651*     Chemistry Recent Labs  Lab 07/08/19 0814  NA 139  K 4.9  CL 106  CO2 23  GLUCOSE 148*  BUN 25*  CREATININE 1.87*  CALCIUM 8.7*  GFRNONAA 42*  GFRAA 48*  ANIONGAP 10    No results for input(s): PROT, ALBUMIN, AST, ALT, ALKPHOS, BILITOT in the last 168 hours. Hematology Recent Labs  Lab 07/08/19 0814  WBC 10.0  RBC 4.53  HGB 13.4  HCT 39.6  MCV 87.4  MCH 29.6  MCHC 33.8  RDW 13.4  PLT 257   BNP Recent Labs  Lab 07/08/19 0814  BNP 534.0*    DDimer No results for input(s): DDIMER in the last 168 hours.   Radiology/Studies:  Dg Chest 2 View  Result Date: 07/08/2019 CLINICAL DATA:  Onset shortness of breath and chest pain last night. EXAM: CHEST - 2 VIEW COMPARISON:  [facility] and lateral chest 08/03/2017 and 03/08/2015. FINDINGS: Hazy bilateral pulmonary opacities and peribronchial thickening are most consistent with pulmonary edema. Heart size is mildly enlarged. No pneumothorax or pleural fluid. No acute or focal bony abnormality. IMPRESSION: Appearance of the chest most consistent with pulmonary edema. Electronically Signed   By: Drusilla Kannerhomas  Dalessio [name]    On: 07/08/2019 07:17    Assessment and Plan:   [name] is a 48 y.o. male with a hx of PAF on Flecainide and Xarelto, obesity, OSA on CPAP, HTN, T2DM and prior h/o ETOH/cocaine use, who is being seen today for the evaluation of tachycardia, acute CHF and elevated troponin, at the request of Dr. Lynelle DoctorKnapp, Emergency Medicine.   1. SVT: Initial rate in the 140s. He converted to NSR after adenosine and IV cardizem  Heart rate now controlled in the 70s.  K and Mg within normal limits  UA negative  Ethanol level and rapid urine drug screen both negative  He denies any recurrent use of Adderall which was the previous trigger for his atrial fibrillation in the past  Will continue home flecainide dose.  Will repeat echocardiogram  Monitor on tele  2. Acute on Chronic Diastolic CHF: prior echocardiogram in 2014 showed G2DD. EF vigorous 65-70%. Not on home diuretic. Presenting now w/ acute dyspnea/orthopnea and PND. CXR w/ pulmonary edema and BNP elevated at 534. He admits to high salt diet. Eats lots of processed foods/frozen meals.   He was given dose of  IV Lasix in the ED and dyspnea improved. Does not appear grossly volume overloaded on exam  Given AKI and current volume status, will hold off on additional Lasix for now. Reassess in the AM.  Will order echo   3. Elevated Troponin: initial Hs troponin abnormal at 651.  Suspect likely 2/2 demand ischemia from tachycardia and acute CHF   Will cycle to ensure no significant upward trend to suggest otherwise  He denies CP  4. AKI: SCr elevated at 1.87. Baseline Scr 1.1.   May be prerenal from tachycardia/ decreased perfusion + recent diarrhea  Will give IVFs 500cc over 5 hrs   Hold lisinopril   F/u BMP in the AM  Avoid nephrotoxic agents   5. PAF: on Flecainide for rhythm control and Xarelto for anticoagulation   Currently in NSR.  Continue Flecainide 100 bid   Continue Xarelto  Continue CPAP qhs    For  questions or updates, please contact [facility] Please consult [email or web address] for contact info under     Signed, Robbie Lis, [facility]  07/08/2019 1:15 PM  I have examined the patient and reviewed assessment and plan and discussed with patient.  Agree with above as stated.    Patient with diarrhea and AKI.  Mild hydration, and check echocardiogram. Feeling marked fatigue.  No LE edema. Now in a RRR.  He had an episode of SVT earlier today but then converted to NSR. Will continue rate slowing drug.    May have been dehydration that triggered his SVT.  Watch on tele.    [name]

## 2019-07-08 NOTE — ED Notes (Signed)
Attempted to call report, was told nurse will call this RN back.

## 2019-07-08 NOTE — ED Notes (Signed)
Callie with cardiology called this RN and is aware of troponin of 758.

## 2019-07-08 NOTE — ED Notes (Signed)
Pt given diet sprite 

## 2019-07-08 NOTE — ED Notes (Signed)
Attempted to page cardiology about troponin of 758, will continue to monitor until further orders.

## 2019-07-08 NOTE — Progress Notes (Signed)
  Echocardiogram 2D Echocardiogram has been performed.  [name] 07/08/2019, 4:26 PM

## 2019-07-08 NOTE — ED Notes (Signed)
ED TO INPATIENT HANDOFF REPORT  ED Nurse Name and Phone #:  [phone] [name] Name/Age/Gender [name] 48 y.o. male Room/Bed: 047C/047C  Code Status   Code Status: Prior  Home/SNF/Other Home Patient oriented to: self, place, time and situation Is this baseline? Yes   Triage Complete: Triage complete  Chief Complaint sob  Triage Note To ED via pov for eval of sob and heart racing. Pt states he 'has been fighting with my air all night'. Unable to use c-pap when went to bed due to feeling so sob. Pt bp noted to by systolic in 80s and rechecked with same reading.    Allergies Allergies  Allergen Reactions  . Shrimp [Shellfish Allergy] Anaphylaxis  . Banana Other (See Comments)    Pt gags  . Watermelon Flavor Nausea And Vomiting  . Peanut-Containing Drug Products Itching and Cough  . Almond Oil Itching    Roof of mouth itches  . Sulfa Antibiotics Itching    Level of Care/Admitting Diagnosis ED Disposition    ED Disposition Condition Comment   Admit  Hospital Area: [facility] [[id number]]  Level of Care: Telemetry Cardiac [103]  Covid Evaluation: Asymptomatic Screening Protocol (No Symptoms)  Diagnosis: SVT (supraventricular tachycardia) (HCC) [662947]  Admitting Physician: Corky Crafts [[id number]]  Attending Physician: Corky Crafts [[id number]]  PT Class (Do Not Modify): Observation [104]  PT Acc Code (Do Not Modify): Observation [10022]       B Medical/Surgery History Past Medical History:  Diagnosis Date  . Diabetes mellitus   . GERD (gastroesophageal reflux disease)   . History of alcohol abuse   . History of cocaine use   . History of echocardiogram    a. Echo 4/14: Moderate LVH, vigorous LVEF, EF 65-70%, normal wall motion, grade 2 diastolic dysfunction, mildly dilated aortic root and ascending aorta, ascending aorta 40 mm, aortic root 38 mm, mild LAE  . Hx of cardiovascular stress test    a. GXT 5/14: No ischemic changes   //  b. ETT-Myoview 3/16:  Low risk, no ischemia, EF 58%  . Hyperlipidemia   . Hypertension   . Morbid obesity (HCC)   . Obesity   . Paroxysmal atrial fibrillation (HCC)    Occurring in 2008, with several recurrence since then (including in the setting of + cocaine on UDS).  . Sleep apnea    Past Surgical History:  Procedure Laterality Date  . APPENDECTOMY    . ROTATOR CUFF REPAIR    . TONSILLECTOMY       A IV Location/Drains/Wounds Patient Lines/Drains/Airways Status   Active Line/Drains/Airways    Name:   Placement date:   Placement time:   Site:   Days:   Peripheral IV 07/08/19 Left Hand   07/08/19    0709    Hand   less than 1   Peripheral IV 07/08/19 Right Antecubital   07/08/19    0825    Antecubital   less than 1          Intake/Output Last 24 hours  Intake/Output Summary (Last 24 hours) at 07/08/2019 1752 Last data filed at 07/08/2019 0908 Gross per 24 hour  Intake 250 ml  Output -  Net 250 ml    Labs/Imaging Results for orders placed or performed during the hospital encounter of 07/08/19 (from the past 48 hour(s))  Basic metabolic panel     Status: Abnormal   Collection Time: 07/08/19  8:14 AM  Result Value Ref Range  Sodium 139 135 - 145 mmol/L   Potassium 4.9 3.5 - 5.1 mmol/L   Chloride 106 98 - 111 mmol/L   CO2 23 22 - 32 mmol/L   Glucose, Bld 148 (H) 70 - 99 mg/dL   BUN 25 (H) 6 - 20 mg/dL   Creatinine, Ser 1.301.87 (H) 0.61 - 1.24 mg/dL   Calcium 8.7 (L) 8.9 - 10.3 mg/dL   GFR calc non Af Amer 42 (L) >60 mL/min   GFR calc Af Amer 48 (L) >60 mL/min   Anion gap 10 5 - 15    Comment: Performed at [facility], [address] [place], [place] [address]  CBC     Status: None   Collection Time: 07/08/19  8:14 AM  Result Value Ref Range   WBC 10.0 4.0 - 10.5 K/uL   RBC 4.53 4.22 - 5.81 MIL/uL   Hemoglobin 13.4 13.0 - 17.0 g/dL   HCT 46.939.6 62.939.0 - 52.852.0 %   MCV 87.4 80.0 - 100.0 fL   MCH 29.6 26.0 - 34.0 pg   MCHC 33.8 30.0 - 36.0 g/dL   RDW 41.313.4  24.411.5 - 01.015.5 %   Platelets 257 150 - 400 K/uL   nRBC 0.0 0.0 - 0.2 %    Comment: Performed at [facility], [address] [place], [place] [address]  Troponin I (High Sensitivity)     Status: Abnormal   Collection Time: 07/08/19  8:14 AM  Result Value Ref Range   Troponin I (High Sensitivity) 651 (HH) <18 ng/L    Comment: CRITICAL RESULT CALLED TO, READ BACK BY AND VERIFIED WITH: DOOLEY,H RN @ [phone] 07/08/19 LEONARD,A (NOTE) Elevated high sensitivity troponin I (hsTnI) values and significant  changes across serial measurements may suggest ACS but many other  chronic and acute conditions are known to elevate hsTnI results.  Refer to the Links section for chest pain algorithms and additional  guidance. Performed at [facility], [address] [place], [place] [address]   Magnesium     Status: None   Collection Time: 07/08/19  8:14 AM  Result Value Ref Range   Magnesium 1.8 1.7 - 2.4 mg/dL    Comment: Performed at [facility], [address] [place], [place] [address]  Ethanol     Status: None   Collection Time: 07/08/19  8:14 AM  Result Value Ref Range   Alcohol, Ethyl (B) <10 <10 mg/dL    Comment: (NOTE) Lowest detectable limit for serum alcohol is 10 mg/dL. For medical purposes only. Performed at [facility], [address] [place], [place] [address]   Brain natriuretic peptide     Status: Abnormal   Collection Time: 07/08/19  8:14 AM  Result Value Ref Range   B Natriuretic Peptide 534.0 (H) 0.0 - 100.0 pg/mL    Comment: Performed at [facility], [address] [place], [place] [address]  Rapid urine drug screen (hospital performed)     Status: None   Collection Time: 07/08/19 10:25 AM  Result Value Ref Range   Opiates NONE DETECTED NONE DETECTED   Cocaine NONE DETECTED NONE DETECTED   Benzodiazepines NONE DETECTED NONE DETECTED   Amphetamines NONE DETECTED NONE DETECTED   Tetrahydrocannabinol NONE DETECTED NONE DETECTED    Barbiturates NONE DETECTED NONE DETECTED    Comment: (NOTE) DRUG SCREEN FOR MEDICAL PURPOSES ONLY.  IF CONFIRMATION IS NEEDED FOR ANY PURPOSE, NOTIFY LAB WITHIN 5 DAYS. LOWEST DETECTABLE LIMITS  FOR URINE DRUG SCREEN Drug Class                     Cutoff (ng/mL) Amphetamine and metabolites    1000 Barbiturate and metabolites    200 Benzodiazepine                 500 Tricyclics and metabolites     300 Opiates and metabolites        300 Cocaine and metabolites        300 THC                            50 Performed at [facility], [place] [address] [place], [place] [address]   Urinalysis, Routine w reflex microscopic     Status: Abnormal   Collection Time: 07/08/19 10:25 AM  Result Value Ref Range   Color, Urine YELLOW YELLOW   APPearance HAZY (A) CLEAR   Specific Gravity, Urine 1.019 1.005 - 1.030   pH 5.0 5.0 - 8.0   Glucose, UA NEGATIVE NEGATIVE mg/dL   Hgb urine dipstick SMALL (A) NEGATIVE   Bilirubin Urine NEGATIVE NEGATIVE   Ketones, ur NEGATIVE NEGATIVE mg/dL   Protein, ur 30 (A) NEGATIVE mg/dL   Nitrite NEGATIVE NEGATIVE   Leukocytes,Ua NEGATIVE NEGATIVE   RBC / HPF 0-5 0 - 5 RBC/hpf   WBC, UA 0-5 0 - 5 WBC/hpf   Bacteria, UA RARE (A) NONE SEEN   Squamous Epithelial / LPF 0-5 0 - 5   Mucus PRESENT    Hyaline Casts, UA PRESENT     Comment: Performed at [facility], [address] [place], [place] [address]  TSH     Status: None   Collection Time: 07/08/19  4:27 PM  Result Value Ref Range   TSH 1.491 0.350 - 4.500 uIU/mL    Comment: Performed by a 3rd Generation assay with a functional sensitivity of <=0.01 uIU/mL. Performed at [facility], [place] [address], [place], [place] [address]   Troponin I (High Sensitivity)     Status: Abnormal   Collection Time: 07/08/19  4:27 PM  Result Value Ref Range   Troponin I (High Sensitivity) 758 (HH) <18 ng/L    Comment: DELTA CHECK NOTED CRITICAL RESULT CALLED TO, READ BACK BY AND VERIFIED WITH: Simon Rhein RN  AT 1735 ON [id number] BY K FORSYTH (NOTE) Elevated high sensitivity troponin I (hsTnI) values and significant  changes across serial measurements may suggest ACS but many other  chronic and acute conditions are known to elevate hsTnI results.  Refer to the Links section for chest pain algorithms and additional  guidance. Performed at [facility], [place] [address] [place],  [address]    Dg Chest 2 View  Result Date: 07/08/2019 CLINICAL DATA:  Onset shortness of breath and chest pain last night. EXAM: CHEST - 2 VIEW COMPARISON:  PA and lateral chest 08/03/2017 and 03/08/2015. FINDINGS: Hazy bilateral pulmonary opacities and peribronchial thickening are most consistent with pulmonary edema. Heart size is mildly enlarged. No pneumothorax or pleural fluid. No acute or focal bony abnormality. IMPRESSION: Appearance of the chest most consistent with pulmonary edema. Electronically Signed   By: Inge Rise [name]   On: 07/08/2019 07:17    Pending Labs Unresulted Labs (From admission, onward)    Start     Ordered   07/09/19 8527  Basic metabolic panel  Tomorrow morning,  R     07/08/19 1454          Vitals/Pain Today's Vitals   07/08/19 1315 07/08/19 1400 07/08/19 1415 07/08/19 1744  BP: 104/73 138/90 133/86 137/90  Pulse: 70 69 70 64  Resp: 18 (!) 21 (!) 21 15  Temp:      TempSrc:      SpO2: 97% 98% 97% 100%  PainSc:        Isolation Precautions No active isolations  Medications Medications  diltiazem (CARDIZEM) 1 mg/mL load via infusion 10 mg (10 mg Intravenous Bolus from Bag 07/08/19 0824)    And  diltiazem (CARDIZEM) 100 mg in dextrose 5% (1 mg/mL) infusion (5 mg/hr Intravenous New Bag/Given 07/08/19 0822)  0.9 %  sodium chloride infusion ( Intravenous New Bag/Given 07/08/19 1706)  flecainide (TAMBOCOR) tablet 100 mg (has no administration in time range)  metoprolol succinate (TOPROL-XL) 24 hr tablet 25 mg (has no administration in time range)   rivaroxaban (XARELTO) tablet 20 mg (20 mg Oral Given 07/08/19 1732)  glipiZIDE (GLUCOTROL) tablet 10 mg (has no administration in time range)  sodium chloride flush (NS) 0.9 % injection 3 mL (10 mLs Intravenous Given 07/08/19 0710)  flecainide (TAMBOCOR) tablet 100 mg (100 mg Oral Given 07/08/19 0758)  adenosine (ADENOCARD) 6 MG/2ML injection 6 mg (6 mg Intravenous Given 07/08/19 0731)  adenosine (ADENOCARD) 6 MG/2ML injection 12 mg (12 mg Intravenous Given 07/08/19 0733)  sodium chloride 0.9 % bolus 250 mL (0 mLs Intravenous Stopped 07/08/19 0908)  furosemide (LASIX) injection 20 mg (20 mg Intravenous Given 07/08/19 1023)  aspirin chewable tablet 324 mg (324 mg Oral Given 07/08/19 1022)    Mobility walks Low fall risk   Focused Assessments Cardiac Assessment Handoff:  Cardiac Rhythm: Sinus tachycardia Lab Results  Component Value Date   CKTOTAL 104 01/16/2013   CKMB 1.2 01/16/2013   TROPONINI <0.30 01/23/2013   Lab Results  Component Value Date   DDIMER  05/06/2007    0.23        AT THE INHOUSE ESTABLISHED CUTOFF VALUE OF 0.48 ug/mL FEU, THIS ASSAY HAS BEEN DOCUMENTED IN THE LITERATURE TO HAVE   Does the Patient currently have chest pain? No     R Recommendations: See Admitting Provider Note  Report given to:   Additional Notes:

## 2019-07-09 DIAGNOSIS — N179 Acute kidney failure, unspecified: Secondary | ICD-10-CM | POA: Diagnosis not present

## 2019-07-09 DIAGNOSIS — E860 Dehydration: Secondary | ICD-10-CM | POA: Diagnosis not present

## 2019-07-09 DIAGNOSIS — I471 Supraventricular tachycardia: Secondary | ICD-10-CM | POA: Diagnosis not present

## 2019-07-09 DIAGNOSIS — I480 Paroxysmal atrial fibrillation: Secondary | ICD-10-CM | POA: Diagnosis not present

## 2019-07-09 LAB — BASIC METABOLIC PANEL
Anion gap: 8 (ref 5–15)
BUN: 25 mg/dL — ABNORMAL HIGH (ref 6–20)
CO2: 25 mmol/L (ref 22–32)
Calcium: 8.4 mg/dL — ABNORMAL LOW (ref 8.9–10.3)
Chloride: 105 mmol/L (ref 98–111)
Creatinine, Ser: 1.38 mg/dL — ABNORMAL HIGH (ref 0.61–1.24)
GFR calc Af Amer: >60 mL/min (ref >60)
GFR calc non Af Amer: >60 mL/min (ref >60)
Glucose, Bld: 118 mg/dL — ABNORMAL HIGH (ref 70–99)
Potassium: 4.5 mmol/L (ref 3.5–5.1)
Sodium: 138 mmol/L (ref 135–145)

## 2019-07-09 MED ORDER — METOPROLOL SUCCINATE ER 50 MG PO TB24: 50.0000 mg | ORAL_TABLET | Freq: Every day | ORAL | Status: DC

## 2019-07-09 NOTE — Progress Notes (Addendum)
Progress Note  Patient Name: [name] Date of Encounter: 07/09/2019  Primary Cardiologist: Lance Muss, MD   Subjective   Feels sluggish this morning.   Inpatient Medications    Scheduled Meds: . flecainide  100 mg Oral BID  . glipiZIDE  10 mg Oral BID AC  . metoprolol succinate  25 mg Oral Daily  . rivaroxaban  20 mg Oral Q supper   Continuous Infusions: . diltiazem (CARDIZEM) infusion 5 mg/hr (07/09/19 0044)   PRN Meds:    Vital Signs    Vitals:   07/08/19 2038 07/09/19 0000 07/09/19 0323 07/09/19 0743  BP: 133/79  (!) 136/94 (!) 148/82  Pulse: 62  70 67  Resp: 18  16 16   Temp: 98 F (36.7 C)  98.4 F (36.9 C) 97.8 F (36.6 C)  TempSrc: Oral  Oral Oral  SpO2: 99%  98% 95%  Weight:  131.5 kg    Height:  5\' 9"  (1.753 m)      Intake/Output Summary (Last 24 hours) at 07/09/2019 0931 Last data filed at 07/09/2019 0500 Gross per 24 hour  Intake 471.44 ml  Output -  Net 471.44 ml   Last 3 Weights 07/09/2019 05/12/2019 02/01/2019  Weight (lbs) 290 lb 293 lb 9.6 oz 262 lb  Weight (kg) 131.543 kg 133.176 kg 118.842 kg      Telemetry    SR - Personally Reviewed  ECG    No new tracing this morning - Personally Reviewed  Physical Exam  Pleasant young AAM, sitting in bed. GEN: No acute distress.   Neck: No JVD Cardiac: RRR, no murmurs, rubs, or gallops.  Respiratory: Clear to auscultation bilaterally. GI: Soft, nontender, non-distended  MS: No edema; No deformity. Neuro:  Nonfocal  Psych: Normal affect   Labs    High Sensitivity Troponin:   Recent Labs  Lab 07/08/19 0814 07/08/19 1627 07/08/19 1850  TROPONINIHS 651* 758* 719*      Chemistry Recent Labs  Lab 07/08/19 0814 07/09/19 0643  NA 139 138  K 4.9 4.5  CL 106 105  CO2 23 25  GLUCOSE 148* 118*  BUN 25* 25*  CREATININE 1.87* 1.38*  CALCIUM 8.7* 8.4*  GFRNONAA 42* >60  GFRAA 48* >60  ANIONGAP 10 8     Hematology Recent Labs  Lab 07/08/19 0814  WBC 10.0   RBC 4.53  HGB 13.4  HCT 39.6  MCV 87.4  MCH 29.6  MCHC 33.8  RDW 13.4  PLT 257    BNP Recent Labs  Lab 07/08/19 0814  BNP 534.0*     DDimer No results for input(s): DDIMER in the last 168 hours.   Radiology    Dg Chest 2 View  Result Date: 07/08/2019 CLINICAL DATA:  Onset shortness of breath and chest pain last night. EXAM: CHEST - 2 VIEW COMPARISON:  PA and lateral chest 08/03/2017 and 03/08/2015. FINDINGS: Hazy bilateral pulmonary opacities and peribronchial thickening are most consistent with pulmonary edema. Heart size is mildly enlarged. No pneumothorax or pleural fluid. No acute or focal bony abnormality. IMPRESSION: Appearance of the chest most consistent with pulmonary edema. Electronically Signed   By: 08/05/2017 [name]   On: 07/08/2019 07:17    Cardiac Studies   TTE: 07/08/19  IMPRESSIONS    1. Left ventricular ejection fraction, by visual estimation, is 60 to 65%. The left ventricle has normal function. Normal left ventricular size. There is no left ventricular hypertrophy.  2. Left ventricular diastolic Doppler parameters are consistent  with impaired relaxation pattern of LV diastolic filling.  3. Global right ventricle has normal systolic function.The right ventricular size is normal. No increase in right ventricular wall thickness.  4. Left atrial size was normal.  5. Right atrial size was normal.  6. The mitral valve is normal in structure. No evidence of mitral valve regurgitation. No evidence of mitral stenosis.  7. The tricuspid valve is normal in structure. Tricuspid valve regurgitation was not visualized by color flow Doppler.  8. The aortic valve is normal in structure. Aortic valve regurgitation was not visualized by color flow Doppler. Structurally normal aortic valve, with no evidence of sclerosis or stenosis.  9. The pulmonic valve was normal in structure. Pulmonic valve regurgitation is not visualized by color flow Doppler. 10. The inferior  vena cava is normal in size with greater than 50% respiratory variability, suggesting right atrial pressure of 3 mmHg.  FINDINGS  Left Ventricle: Left ventricular ejection fraction, by visual estimation, is 60 to 65%. The left ventricle has normal function. No evidence of left ventricular regional wall motion abnormalities. There is no left ventricular hypertrophy. Normal left  ventricular size. Spectral Doppler shows Left ventricular diastolic Doppler parameters are consistent with impaired relaxation pattern of LV diastolic filling. Normal left ventricular filling pressures.  Right Ventricle: The right ventricular size is normal. No increase in right ventricular wall thickness. Global RV systolic function is has normal systolic function.  Left Atrium: Left atrial size was normal in size.  Right Atrium: Right atrial size was normal in size  Pericardium: There is no evidence of pericardial effusion.  Mitral Valve: The mitral valve is normal in structure. No evidence of mitral valve stenosis by observation. No evidence of mitral valve regurgitation.  Tricuspid Valve: The tricuspid valve is normal in structure. Tricuspid valve regurgitation was not visualized by color flow Doppler.  Aortic Valve: The aortic valve is normal in structure. Aortic valve regurgitation was not visualized by color flow Doppler. The aortic valve is structurally normal, with no evidence of sclerosis or stenosis.  Pulmonic Valve: The pulmonic valve was normal in structure. Pulmonic valve regurgitation is not visualized by color flow Doppler.  Aorta: The aortic root, ascending aorta and aortic arch are all structurally normal, with no evidence of dilitation or obstruction.  Venous: The inferior vena cava is normal in size with greater than 50% respiratory variability, suggesting right atrial pressure of 3 mmHg.  IAS/Shunts: No atrial level shunt detected by color flow Doppler. No ventricular septal defect is  seen or detected. There is no evidence of an atrial septal defect.  Patient Profile     48 y.o. male with a hx of PAF on Flecainide and Xarelto, obesity, OSA on CPAP, HTN, T2DM and prior h/o ETOH/cocaine use, who was seen for the evaluation of tachycardia, acute CHF and elevated troponin.  Assessment & Plan    1. SVT: Initial rate in the 140s. He converted to NSR after adenosine and IV cardizem whil I the ED. Maintained SR overnight. Will stop Dilt, increase Toprol to 50 daily. Suspect this was in the setting of dehydration, and increased caffeine intake. Drinks lots of soda and coffee.  2. Acute on Chronic Diastolic CHF: prior echocardiogram in 2014 showed G2DD. EF vigorous 65-70%. Not on home diuretic. Presented w/ acute dyspnea/orthopnea and PND. CXR w/ pulmonary edema and BNP elevated at 534. He admits to high salt diet. Eats lots of processed foods/frozen meals.  -- given IV lasix in the ED, walked to the  bathroom and felt ok this morning. Will have him ambulate in the hallway -- echo 9/22 with normal EF.   3. Elevated Troponin: initial Hs troponin abnormal at 651 on admission. Low flat trend overnight. -- Suspect likely 2/2 demand ischemia from tachycardia and acute CHF   4. AKI: SCr elevated at 1.87>>1.3 today. Baseline Scr 1.1. Suspect this was prerenal from tachycardia/ decreased perfusion + recent diarrhea.   5. PAF: on Flecainide for rhythm control and Xarelto for anticoagulation.   For questions or updates, please contact [place] Please consult [email or web address] for contact info under   Signed, Reino Bellis, NP  07/09/2019, 9:31 AM    I have examined the patient and reviewed assessment and plan and discussed with patient.  Agree with above as stated.  Back in NSR.  Avoid dehydraton.  No further diarrhea.  I would ask that he would stop drinking sugary drinks.   COntinue Toprol and Flecainide.  I think that the dehydration triggered the tachycardia.  Xarelto for stroke  prevention given h/o AFib. Likely discharge later today.  DOing well off of cardizem.  Larae Grooms

## 2019-07-09 NOTE — Progress Notes (Signed)
Discharge instructions given. Pt verbalized understanding and all questions were answered.  

## 2019-07-09 NOTE — Discharge Summary (Addendum)
Discharge Summary    Patient ID: [name],  MRN: [id number], DOB/AGE: [birth year] 48 y.o.  Admit date: 07/08/2019 Discharge date: 07/09/2019  Primary Care Provider: Renford Dills Primary Cardiologist: Lance Muss, MD  Discharge Diagnoses    Active Problems:   SVT (supraventricular tachycardia) (HCC)   Allergies Allergies  Allergen Reactions  . Shrimp [Shellfish Allergy] Anaphylaxis  . Banana Other (See Comments)    Pt gags  . Watermelon Flavor Nausea And Vomiting  . Peanut-Containing Drug Products Itching and Cough  . Almond Oil Itching    Roof of mouth itches  . Sulfa Antibiotics Itching    Diagnostic Studies/Procedures    TTE: 07/08/19  IMPRESSIONS    1. Left ventricular ejection fraction, by visual estimation, is 60 to 65%. The left ventricle has normal function. Normal left ventricular size. There is no left ventricular hypertrophy.  2. Left ventricular diastolic Doppler parameters are consistent with impaired relaxation pattern of LV diastolic filling.  3. Global right ventricle has normal systolic function.The right ventricular size is normal. No increase in right ventricular wall thickness.  4. Left atrial size was normal.  5. Right atrial size was normal.  6. The mitral valve is normal in structure. No evidence of mitral valve regurgitation. No evidence of mitral stenosis.  7. The tricuspid valve is normal in structure. Tricuspid valve regurgitation was not visualized by color flow Doppler.  8. The aortic valve is normal in structure. Aortic valve regurgitation was not visualized by color flow Doppler. Structurally normal aortic valve, with no evidence of sclerosis or stenosis.  9. The pulmonic valve was normal in structure. Pulmonic valve regurgitation is not visualized by color flow Doppler. 10. The inferior vena cava is normal in size with greater than 50% respiratory variability, suggesting right atrial pressure of 3 mmHg.   _____________   History of Present Illness     [name] is a 48 y/o male w/ ahx of HTN, DM2, obesity, OSA on CPAP, prior ETOH/cocaine use and PAF. Remotely seen by Dr. Reyes Ivan in the past. He was evaluated by Dr. Delane Ginger in 4/14 in the hospital during an admission with PAF with RVR.Ritalin was adrug tobe avoided in the futuresince the AFib started with medicine. He converted to NSR and long-term anticoagulation was recommended given his stroke risk. Placed on Xarelto. Flecainide was added to maintain NSR. He was to follow up in  office for ETT to rule out pro-arrhythmia, but was not seen here until 2/16. Review of his chart indicates he had an ETT at [facility] in 02/2013. Hewas placed back on Flecainide and FU ETT-Myoview was low risk and neg for pro-arrhythmia. he has since been followed by Dr. Eldridge Dace. His last visit was a telehealth visit 01/15/19. He was doing well at that time w/o cardiac symptoms and denied any symptoms of breakthrough afib.  He had also needed clearance for a colonoscopy. He was cleared and given ok to hold Xarelto for procedure but procedure postponed due to COVID pandemic.  Pt presented to the [facility] early morning hours w/ complaint of SOB and palpitations.  Patient reported that he felt fine most of the day yesterday but felt a little tired.  The night prior to admission while attempting to sleep he had shortness of breath even while wearing CPAP.  Had to sit up on the side of the bed to catch his breath.  Also felt palpitations.  No chest pain.  Given his symptoms, he came to the ED  for evaluation.  In the ED, initial EKG showed sinus tachycardia ? Atrial flutter. Hs troponin elevated at 651. BNP 534. CXR w/ findings c/w pulmonary edema. BMP c/w AKI. SCr elevated at 1.87. Baseline Scr 1.1. K normal at 4.9. Mg normal at 1.8. CBC normal. Afebrile. Ethanol test negative. UDS also negative. UA negative for UTI.    He was initially given adenosine then started on IV  Cardizem drip. Converted to NSR.  Heart rate in the 70s.  Cardiology asked to evaluate.    Patient reported that his palpitations felt similar to what he previously experienced with atrial fibrillation.  He had also noticed exertional dyspnea which was new and also similar to prior symptoms with A. fib.  He reported full medication compliance.  No missed doses of Xarelto in the last 30 days.  Also no missed doses of flecainide.  He reported a moderate amount of caffeine daily, mainly soft drinks.  No other stimulants.  He had not used Adderall in more than a year.  He reported normal p.o. intake.  He did have several bouts of diarrhea over the weekend which had resolved.  He sleeps with a CPAP nightly.  Hospital Course     .ZOX:WRUEAVW rate in the 140s. He converted to NSR after adenosine and IV cardizemwhile in the ED.Maintained SR overnight. Stopped Dilt drip. Suspect this was in the setting of dehydration, and increased caffeine intake. Drinks lots of soda and coffee. Instructed to avoid in the future.  2. Acute on Chronic Diastolic CHF: prior echocardiogram in 2014 showed G2DD. EF vigorous 65-70%. Not on home diuretic. Presented w/ acute dyspnea/orthopnea and PND. CXR w/ pulmonary edema and BNP elevated at 534.He admited to high salt diet. Was eating lots of processed foods/frozen meals.  -- given IV lasix in the ED, with improvement. Ambulated in the hallway without difficulty. -- echo 9/22 with normal EF.   3. Elevated Troponin: initial Hs troponin abnormal at 651 on admission. Low flat trend overnight. -- Suspect likely 2/2 demand ischemia from tachycardiaandacute CHF   4. AKI: SCr elevated at 1.87>>1.3 the following day. Baseline Scr 1.1. Suspect this was prerenal from tachycardia/ decreasedperfusion + recent diarrhea.   5. PAF: on Flecainide for rhythm control and Xarelto for anticoagulation.   [name] was seen by Dr. Irish Lack and determined stable for discharge  home. Follow up in the office has been arranged. Medications are listed below.   _____________  Discharge Vitals Blood pressure (!) 162/92, pulse 67, temperature 98.1 F (36.7 C), temperature source Oral, resp. rate 18, height 5\' 9"  (1.753 m), weight 131.5 kg, SpO2 100 %.  Filed Weights   07/09/19 0000  Weight: 131.5 kg    Labs & Radiologic Studies    CBC Recent Labs    07/08/19 0814  WBC 10.0  HGB 13.4  HCT 39.6  MCV 87.4  PLT 098   Basic Metabolic Panel Recent Labs    07/08/19 0814 07/09/19 0643  NA 139 138  K 4.9 4.5  CL 106 105  CO2 23 25  GLUCOSE 148* 118*  BUN 25* 25*  CREATININE 1.87* 1.38*  CALCIUM 8.7* 8.4*  MG 1.8  --    Liver Function Tests No results for input(s): AST, ALT, ALKPHOS, BILITOT, PROT, ALBUMIN in the last 72 hours. No results for input(s): LIPASE, AMYLASE in the last 72 hours. Cardiac Enzymes No results for input(s): CKTOTAL, CKMB, CKMBINDEX, TROPONINI in the last 72 hours. BNP Invalid input(s): POCBNP D-Dimer No results for  input(s): DDIMER in the last 72 hours. Hemoglobin A1C No results for input(s): HGBA1C in the last 72 hours. Fasting Lipid Panel No results for input(s): CHOL, HDL, LDLCALC, TRIG, CHOLHDL, LDLDIRECT in the last 72 hours. Thyroid Function Tests Recent Labs    07/08/19 1627  TSH 1.491   _____________  Dg Chest 2 View  Result Date: 07/08/2019 CLINICAL DATA:  Onset shortness of breath and chest pain last night. EXAM: CHEST - 2 VIEW COMPARISON:  PA and lateral chest 08/03/2017 and 03/08/2015. FINDINGS: Hazy bilateral pulmonary opacities and peribronchial thickening are most consistent with pulmonary edema. Heart size is mildly enlarged. No pneumothorax or pleural fluid. No acute or focal bony abnormality. IMPRESSION: Appearance of the chest most consistent with pulmonary edema. Electronically Signed   By: Drusilla Kannerhomas  Dalessio [name]   On: 07/08/2019 07:17   Disposition   Pt is being discharged home today in good  condition.  Follow-up Plans & Appointments    Follow-up Information    [place]estown[place]hagat, Sharrell KuBhavinkumar, [place] Follow up on 07/21/2019.   Specialty: Cardiology Why: at 9:30am for your follow up appt.  Contact information: [address] [place] [address] [phone]          Discharge Instructions    Diet - low sodium heart healthy   Complete by: As directed    Discharge instructions   Complete by: As directed    Please avoid salt in your diet, excessive caffeine and sugar drinks.   Increase activity slowly   Complete by: As directed        Discharge Medications     Medication List    STOP taking these medications   ondansetron 4 MG tablet Commonly known as: ZOFRAN     TAKE these medications   Blood Pressure Monitor/L Cuff Misc 1 Units by Does not apply route daily.   flecainide 100 MG tablet Commonly known as: TAMBOCOR TAKE 1 TABLET BY MOUTH TWICE A DAY   glipiZIDE 10 MG tablet Commonly known as: GLUCOTROL Take 10 mg by mouth 2 (two) times daily.   lisinopril 20 MG tablet Commonly known as: ZESTRIL Take 1 tablet (20 mg total) by mouth daily for 30 days.   metFORMIN 500 MG tablet Commonly known as: GLUCOPHAGE Take 1,000 mg by mouth 2 (two) times daily.   metoprolol succinate 25 MG 24 hr tablet Commonly known as: TOPROL-XL TAKE 1 TABLET BY MOUTH EVERY DAY   rivaroxaban 20 MG Tabs tablet Commonly known as: Xarelto TAKE 1 TABLET (20 MG TOTAL) BY MOUTH DAILY WITH SUPPER. What changed:   how much to take  how to take this  when to take this  additional instructions        Acute coronary syndrome (MI, NSTEMI, STEMI, etc) this admission?: No.     Outstanding Labs/Studies   N/a   Duration of Discharge Encounter   Greater than 30 minutes including physician time.  Signed, Laverda PageLindsay Roberts NP-C 07/09/2019, 1:10 PM   I have examined the patient and reviewed assessment and plan and discussed with patient.  Agree with above as stated.     I have examined the patient and reviewed assessment and plan and discussed with patient.  Agree with above as stated.  Back in NSR.  Avoid dehydraton.  No further diarrhea.  I would ask that he would stop drinking sugary drinks.   Elevated troponin likely related to tachycardia and renal injury.  COntinue Toprol and Flecainide.  I think that the dehydration triggered the  tachycardia.  Xarelto for stroke prevention given h/o AFib. Likely discharge later today.  DOing well off of cardizem.  Lance Muss  [place]

## 2019-07-18 NOTE — Progress Notes (Signed)
Cardiology Office Note    Date:  07/21/2019   ID:  [name], DOB [birth year], MRN [id number]  PCP:  Seward Carol, MD  Cardiologist:  Dr. Irish Lack  Chief Complaint: Hospital follow up  History of Present Illness:   [name] is a 48 y.o. male with hx of HTN, DM2, chronic diastolic CHF,  obesity, OSA on CPAP, prior ETOH/cocaine use and PAF seen for hospital follow up.   He was evaluated by Dr. Liam Rogers in 4/14 in the hospital during an admission with PAF with RVR.Ritalin was adrug tobe avoided in the futuresince the AFib started with medicine. He converted to NSR and long-term anticoagulation was recommended given his stroke risk. Placed on Xarelto. Flecainide was added to maintain NSR. He was to follow up in office for ETT to rule out pro-arrhythmia, but was not seen here until 2/16. Review of his chart indicates he had an ETT at [facility] in 02/2013. Hewas placed back on Flecainide and FU ETT-Myoview was low risk and neg for pro-arrhythmia. he has since been followed by Dr. Irish Lack.  Presented to ER 06/2019 with SOB and palpitation. HR was 140s. He converted to NSR after adenosine and IV cardizemwhile in the ED. Suspect this was in the setting of dehydration, and increased caffeine intake. Drinks lots of soda and coffee. Echo showed normal LVEF. Advised to avoid high salt.   Here today for follow up.  He has made lifestyle changes.  He has cut back on caffeinated products.  Walks 30 minutes each day.  Trying to lose weight.  He denies palpitation, dizziness, orthopnea, PND, syncope or lower extremity edema.   Past Medical History:  Diagnosis Date  . Diabetes mellitus   . GERD (gastroesophageal reflux disease)   . History of alcohol abuse   . History of cocaine use   . History of echocardiogram    a. Echo 4/14: Moderate LVH, vigorous LVEF, EF 65-70%, normal wall motion, grade 2 diastolic dysfunction, mildly dilated aortic root and ascending aorta,  ascending aorta 40 mm, aortic root 38 mm, mild LAE  . Hx of cardiovascular stress test    a. GXT 5/14: No ischemic changes  //  b. ETT-Myoview 3/16:  Low risk, no ischemia, EF 58%  . Hyperlipidemia   . Hypertension   . Morbid obesity ([place])   . Obesity   . Paroxysmal atrial fibrillation ([place])    Occurring in 2008, with several recurrence since then (including in the setting of + cocaine on UDS).  . Sleep apnea     Past Surgical History:  Procedure Laterality Date  . APPENDECTOMY    . ROTATOR CUFF REPAIR    . TONSILLECTOMY      Current Medications: Prior to Admission medications   Medication Sig Start Date End Date Taking? Authorizing Provider  Blood Pressure Monitoring (BLOOD PRESSURE MONITOR/L CUFF) MISC 1 Units by Does not apply route daily. 03/19/19   Tacy Learn, PA-C  flecainide (TAMBOCOR) 100 MG tablet TAKE 1 TABLET BY MOUTH TWICE A DAY Patient taking differently: Take 100 mg by mouth 2 (two) times daily.  02/25/19   Jettie Booze, MD  glipiZIDE (GLUCOTROL) 10 MG tablet Take 10 mg by mouth 2 (two) times daily. 01/06/19   [provider]  lisinopril (PRINIVIL,ZESTRIL) 20 MG tablet Take 1 tablet (20 mg total) by mouth daily for 30 days. 01/24/19 07/08/19  Aline August, MD  metFORMIN (GLUCOPHAGE) 500 MG tablet Take 1,000 mg by mouth 2 (two) times  daily. 01/06/19   [provider]  metoprolol succinate (TOPROL-XL) 25 MG 24 hr tablet TAKE 1 TABLET BY MOUTH EVERY DAY Patient taking differently: Take 25 mg by mouth daily.  02/25/19   Corky Crafts, MD  rivaroxaban (XARELTO) 20 MG TABS tablet TAKE 1 TABLET (20 MG TOTAL) BY MOUTH DAILY WITH SUPPER. Patient taking differently: Take 20 mg by mouth daily with supper.  01/15/19   Corky Crafts, MD    Allergies:   Shrimp [shellfish allergy], Banana, Watermelon flavor, Peanut-containing drug products, Almond oil, and Sulfa antibiotics   Social History   Socioeconomic History  . Marital status: Single     Spouse name: Not on file  . Number of children: 0  . Years of education: 70  . Highest education level: Not on file  Occupational History  . Occupation: Designer, industrial/product: UNEMPLOYED  Social Needs  . Financial resource strain: Not on file  . Food insecurity    Worry: Not on file    Inability: Not on file  . Transportation needs    Medical: Not on file    Non-medical: Not on file  Tobacco Use  . Smoking status: Current Every Day Smoker    Packs/day: 1.00    Years: 28.00    Pack years: 28.00  . Smokeless tobacco: Former Neurosurgeon    Types: Snuff  . Tobacco comment: smokes one pack per day 05/12/2019  Substance and Sexual Activity  . Alcohol use: Yes    Comment: occasionally - 1 beer 1x/mont  . Drug use: No    Comment: cocaine in the past, none currently  . Sexual activity: Not on file  Lifestyle  . Physical activity    Days per week: Not on file    Minutes per session: Not on file  . Stress: Not on file  Relationships  . Social Musician on phone: Not on file    Gets together: Not on file    Attends religious service: Not on file    Active member of club or organization: Not on file    Attends meetings of clubs or organizations: Not on file    Relationship status: Not on file  Other Topics Concern  . Not on file  Social History Narrative   Fun: Photographer      Family History:  The patient's family history includes Diabetes in his mother and paternal grandfather; Heart attack (age of onset: 39) in his father; Stroke in his maternal grandmother and paternal grandmother.   ROS:   Please see the history of present illness.    ROS All other systems reviewed and are negative.   PHYSICAL EXAM:   VS:  BP 138/72   Pulse 73   Ht  (1.753 m)   Wt (!) 309 lb (140.2 kg)   SpO2 97%   BMI 45.63 kg/m    GEN: Obese male in no acute distress  HEENT: normal  Neck: no JVD, carotid bruits, or masses Cardiac: RRR; no murmurs, rubs, or gallops,no edema   Respiratory:  clear to auscultation bilaterally, normal work of breathing GI: soft, nontender, nondistended, + BS MS: no deformity or atrophy  Skin: warm and dry, no rash Neuro:  Alert and Oriented x 3, Strength and sensation are intact Psych: euthymic mood, full affect  Wt Readings from Last 3 Encounters:  07/21/19 (!) 309 lb (140.2 kg)  07/09/19 290 lb (131.5 kg)  05/12/19 293 lb 9.6 oz (133.2  kg)      Studies/Labs Reviewed:   EKG:  EKG is not ordered today.  Recent Labs: 01/31/2019: ALT 14 07/08/2019: B Natriuretic Peptide 534.0; Hemoglobin 13.4; Magnesium 1.8; Platelets 257; TSH 1.491 07/09/2019: BUN 25; Creatinine, Ser 1.38; Potassium 4.5; Sodium 138   Lipid Panel    Component Value Date/Time   CHOL 137 01/22/2019 0921   TRIG 133 01/22/2019 0921   HDL 29 (L) 01/22/2019 0921   CHOLHDL 4.7 01/22/2019 0921   VLDL 27 01/22/2019 0921   LDLCALC 81 01/22/2019 0921   LDLDIRECT 103.0 12/08/2016 0852    Additional studies/ records that were reviewed today include:   Echocardiogram: 07/08/2019  1. Left ventricular ejection fraction, by visual estimation, is 60 to 65%. The left ventricle has normal function. Normal left ventricular size. There is no left ventricular hypertrophy.  2. Left ventricular diastolic Doppler parameters are consistent with impaired relaxation pattern of LV diastolic filling.  3. Global right ventricle has normal systolic function.The right ventricular size is normal. No increase in right ventricular wall thickness.  4. Left atrial size was normal.  5. Right atrial size was normal.  6. The mitral valve is normal in structure. No evidence of mitral valve regurgitation. No evidence of mitral stenosis.  7. The tricuspid valve is normal in structure. Tricuspid valve regurgitation was not visualized by color flow Doppler.  8. The aortic valve is normal in structure. Aortic valve regurgitation was not visualized by color flow Doppler. Structurally normal aortic  valve, with no evidence of sclerosis or stenosis.  9. The pulmonic valve was normal in structure. Pulmonic valve regurgitation is not visualized by color flow Doppler. 10. The inferior vena cava is normal in size with greater than 50% respiratory variability, suggesting right atrial pressure of 3 mmHg.  ETT 02/08/2018   Patient walked for 5 minutes and 30 seconds. He achieved a peak heart rate of 151 which is 87% predicted maximal heart rate. Blood pressure demonstrated a hypertensive response to exercise.  There were no ST or T wave changes to suggest ischemia. There was a premature ventricular contraction during the recovery phase.  This is interpreted as a negative treadmill test.    ASSESSMENT & PLAN:    1. SVT in setting of dehydration - Converted to sinus with adenosine. No reoccurance. Continue BB. Discussed vagal maneuver.   2. PAF - Maintaining sinus rhythm. Continue Flecainide, BB and Xarelto. No bleeding issue.   3. OSA on CPAP - Compliant. Trying to loose weight.   4. HTN - BP stable     Medication Adjustments/Labs and Tests Ordered: Current medicines are reviewed at length with the patient today.  Concerns regarding medicines are outlined above.  Medication changes, Labs and Tests ordered today are listed in the Patient Instructions below. Patient Instructions  Medication Instructions:  Your physician recommends that you continue on your current medications as directed. Please refer to the Current Medication list given to you today.  If you need a refill on your cardiac medications before your next appointment, please call your pharmacy.   Lab work: None ordered  If you have labs (blood work) drawn today and your tests are completely normal, you will receive your results only by: Marland Kitchen MyChart Message (if you have MyChart) OR . A paper copy in the mail If you have any lab test that is abnormal or we need to change your treatment, we will call you to review the  results.  Testing/Procedures: None ordered  Follow-Up: At [facility], you and your health needs are our priority.  As part of our continuing mission to provide you with exceptional heart care, we have created designated Provider Care Teams.  These Care Teams include your primary Cardiologist (physician) and Advanced Practice Providers (APPs -  Physician Assistants and Nurse Practitioners) who all work together to provide you with the care you need, when you need it. You will need a follow up appointment in 6 months.  Please call our office 2 months in advance to schedule this appointment.  You may see Lance MussJayadeep Varanasi, MD or one of the following Advanced Practice Providers on your designated Care Team:   MundayBrittainy Simmons, PA-C Ronie Spiesayna Dunn, PA-C . Jacolyn ReedyMichele Lenze, PA-C  Any Other Special Instructions Will Be Listed Below (If Applicable).       Lorelei PontSigned, Tashiba Timoney, [place]  07/21/2019 9:49 AM    [facility] [address], [place], [place]  [address] Phone: [phone]; Fax: [phone]

## 2019-07-21 ENCOUNTER — Encounter: Payer: Self-pay | Admitting: Physician Assistant

## 2019-07-21 ENCOUNTER — Ambulatory Visit (INDEPENDENT_AMBULATORY_CARE_PROVIDER_SITE_OTHER): Payer: 59 | Admitting: Physician Assistant

## 2019-07-21 ENCOUNTER — Other Ambulatory Visit: Payer: Self-pay

## 2019-07-21 VITALS — BP 138/72 | HR 73 | Ht 69.0 in | Wt 309.0 lb

## 2019-07-21 DIAGNOSIS — G4733 Obstructive sleep apnea (adult) (pediatric): Secondary | ICD-10-CM

## 2019-07-21 DIAGNOSIS — I471 Supraventricular tachycardia: Secondary | ICD-10-CM

## 2019-07-21 DIAGNOSIS — Z9989 Dependence on other enabling machines and devices: Secondary | ICD-10-CM

## 2019-07-21 DIAGNOSIS — I480 Paroxysmal atrial fibrillation: Secondary | ICD-10-CM

## 2019-07-21 DIAGNOSIS — I119 Hypertensive heart disease without heart failure: Secondary | ICD-10-CM | POA: Diagnosis not present

## 2019-07-21 NOTE — Patient Instructions (Signed)
Medication Instructions:  Your physician recommends that you continue on your current medications as directed. Please refer to the Current Medication list given to you today.  If you need a refill on your cardiac medications before your next appointment, please call your pharmacy.   Lab work: None ordered  If you have labs (blood work) drawn today and your tests are completely normal, you will receive your results only by: . MyChart Message (if you have MyChart) OR . A paper copy in the mail If you have any lab test that is abnormal or we need to change your treatment, we will call you to review the results.  Testing/Procedures: None ordered  Follow-Up: At [facility], you and your health needs are our priority.  As part of our continuing mission to provide you with exceptional heart care, we have created designated Provider Care Teams.  These Care Teams include your primary Cardiologist (physician) and Advanced Practice Providers (APPs -  Physician Assistants and Nurse Practitioners) who all work together to provide you with the care you need, when you need it. You will need a follow up appointment in 6 months.  Please call our office 2 months in advance to schedule this appointment.  You may see Jayadeep Varanasi, MD or one of the following Advanced Practice Providers on your designated Care Team:   Brittainy Simmons, PA-C Dayna Dunn, PA-C . Michele Lenze, PA-C  Any Other Special Instructions Will Be Listed Below (If Applicable).    

## 2019-08-21 ENCOUNTER — Other Ambulatory Visit: Payer: Self-pay | Admitting: Interventional Cardiology

## 2019-08-21 DIAGNOSIS — I480 Paroxysmal atrial fibrillation: Secondary | ICD-10-CM

## 2019-08-21 NOTE — Telephone Encounter (Signed)
Prescription refill request for Xarelto received.   Last office visit: 07-21-2019, Bhagat Weight: 140.2 kg, 07-21-2019 Age: 48 y.o. Scr: 1.38, 07-09-2019 CrCl:129. 81 ml/min  Prescription refill sent.

## 2019-09-25 ENCOUNTER — Emergency Department (HOSPITAL_COMMUNITY): Payer: 59

## 2019-09-25 ENCOUNTER — Emergency Department (HOSPITAL_COMMUNITY)
Admission: EM | Admit: 2019-09-25 | Discharge: 2019-09-25 | Disposition: A | Discharge status: Home / Self Care | Payer: 59 | Attending: Emergency Medicine | Admitting: Emergency Medicine

## 2019-09-25 ENCOUNTER — Encounter (HOSPITAL_COMMUNITY): Payer: Self-pay

## 2019-09-25 ENCOUNTER — Other Ambulatory Visit: Payer: Self-pay

## 2019-09-25 DIAGNOSIS — F17210 Nicotine dependence, cigarettes, uncomplicated: Secondary | ICD-10-CM | POA: Insufficient documentation

## 2019-09-25 DIAGNOSIS — I5032 Chronic diastolic (congestive) heart failure: Secondary | ICD-10-CM | POA: Diagnosis not present

## 2019-09-25 DIAGNOSIS — B349 Viral infection, unspecified: Secondary | ICD-10-CM | POA: Diagnosis not present

## 2019-09-25 DIAGNOSIS — Z20828 Contact with and (suspected) exposure to other viral communicable diseases: Secondary | ICD-10-CM | POA: Diagnosis not present

## 2019-09-25 DIAGNOSIS — R531 Weakness: Secondary | ICD-10-CM | POA: Diagnosis not present

## 2019-09-25 DIAGNOSIS — Z79899 Other long term (current) drug therapy: Secondary | ICD-10-CM | POA: Diagnosis not present

## 2019-09-25 DIAGNOSIS — Z7984 Long term (current) use of oral hypoglycemic drugs: Secondary | ICD-10-CM | POA: Diagnosis not present

## 2019-09-25 DIAGNOSIS — Z91010 Allergy to peanuts: Secondary | ICD-10-CM | POA: Insufficient documentation

## 2019-09-25 DIAGNOSIS — I110 Hypertensive heart disease with heart failure: Secondary | ICD-10-CM | POA: Diagnosis not present

## 2019-09-25 DIAGNOSIS — R5383 Other fatigue: Secondary | ICD-10-CM | POA: Diagnosis not present

## 2019-09-25 DIAGNOSIS — E119 Type 2 diabetes mellitus without complications: Secondary | ICD-10-CM | POA: Insufficient documentation

## 2019-09-25 DIAGNOSIS — R070 Pain in throat: Secondary | ICD-10-CM | POA: Diagnosis present

## 2019-09-25 LAB — URINALYSIS, ROUTINE W REFLEX MICROSCOPIC
Bilirubin Urine: NEGATIVE
Cellular Cast, UA: 45
Glucose, UA: NEGATIVE mg/dL
Ketones, ur: NEGATIVE mg/dL
Leukocytes,Ua: NEGATIVE
Nitrite: NEGATIVE
Protein, ur: 100 mg/dL — AB
Specific Gravity, Urine: 1.023 (ref 1.005–1.030)
pH: 5 (ref 5.0–8.0)

## 2019-09-25 LAB — CBC WITH DIFFERENTIAL/PLATELET
Abs Immature Granulocytes: 0.05 10*3/uL (ref 0.00–0.07)
Basophils Absolute: 0.1 10*3/uL (ref 0.0–0.1)
Basophils Relative: 1 %
Eosinophils Absolute: 0.8 10*3/uL — ABNORMAL HIGH (ref 0.0–0.5)
Eosinophils Relative: 8 %
HCT: 40 % (ref 39.0–52.0)
Hemoglobin: 12.9 g/dL — ABNORMAL LOW (ref 13.0–17.0)
Immature Granulocytes: 1 %
Lymphocytes Relative: 18 %
Lymphs Abs: 1.6 10*3/uL (ref 0.7–4.0)
MCH: 28.7 pg (ref 26.0–34.0)
MCHC: 32.3 g/dL (ref 30.0–36.0)
MCV: 89.1 fL (ref 80.0–100.0)
Monocytes Absolute: 1.1 10*3/uL — ABNORMAL HIGH (ref 0.1–1.0)
Monocytes Relative: 12 %
Neutro Abs: 5.6 10*3/uL (ref 1.7–7.7)
Neutrophils Relative %: 60 %
Platelets: 296 10*3/uL (ref 150–400)
RBC: 4.49 MIL/uL (ref 4.22–5.81)
RDW: 13.1 % (ref 11.5–15.5)
WBC: 9.2 10*3/uL (ref 4.0–10.5)
nRBC: 0 % (ref 0.0–0.2)

## 2019-09-25 LAB — COMPREHENSIVE METABOLIC PANEL
ALT: 24 U/L (ref 0–44)
AST: 24 U/L (ref 15–41)
Albumin: 3.1 g/dL — ABNORMAL LOW (ref 3.5–5.0)
Alkaline Phosphatase: 84 U/L (ref 38–126)
Anion gap: 7 (ref 5–15)
BUN: 27 mg/dL — ABNORMAL HIGH (ref 6–20)
CO2: 23 mmol/L (ref 22–32)
Calcium: 8.4 mg/dL — ABNORMAL LOW (ref 8.9–10.3)
Chloride: 109 mmol/L (ref 98–111)
Creatinine, Ser: 1.63 mg/dL — ABNORMAL HIGH (ref 0.61–1.24)
GFR calc Af Amer: 57 mL/min — ABNORMAL LOW (ref >60)
GFR calc non Af Amer: 49 mL/min — ABNORMAL LOW (ref >60)
Glucose, Bld: 168 mg/dL — ABNORMAL HIGH (ref 70–99)
Potassium: 5 mmol/L (ref 3.5–5.1)
Sodium: 139 mmol/L (ref 135–145)
Total Bilirubin: 0.5 mg/dL (ref 0.3–1.2)
Total Protein: 6.8 g/dL (ref 6.5–8.1)

## 2019-09-25 LAB — BRAIN NATRIURETIC PEPTIDE: B Natriuretic Peptide: 140.3 pg/mL — ABNORMAL HIGH (ref 0.0–100.0)

## 2019-09-25 LAB — LACTIC ACID, PLASMA: Lactic Acid, Venous: 1.4 mmol/L (ref 0.5–1.9)

## 2019-09-25 LAB — TROPONIN I (HIGH SENSITIVITY)
Troponin I (High Sensitivity): 15 ng/L (ref <18)
Troponin I (High Sensitivity): 11 ng/L (ref <18)

## 2019-09-25 IMAGING — DX DG CHEST 1V PORT
1 series · 1 of 1 positions shown · non-contrast
Comparison: [DATE]

CLINICAL DATA: Cough and shortness of breath. Evaluate for
pneumonia.

EXAM:
PORTABLE CHEST 1 VIEW

[chest]
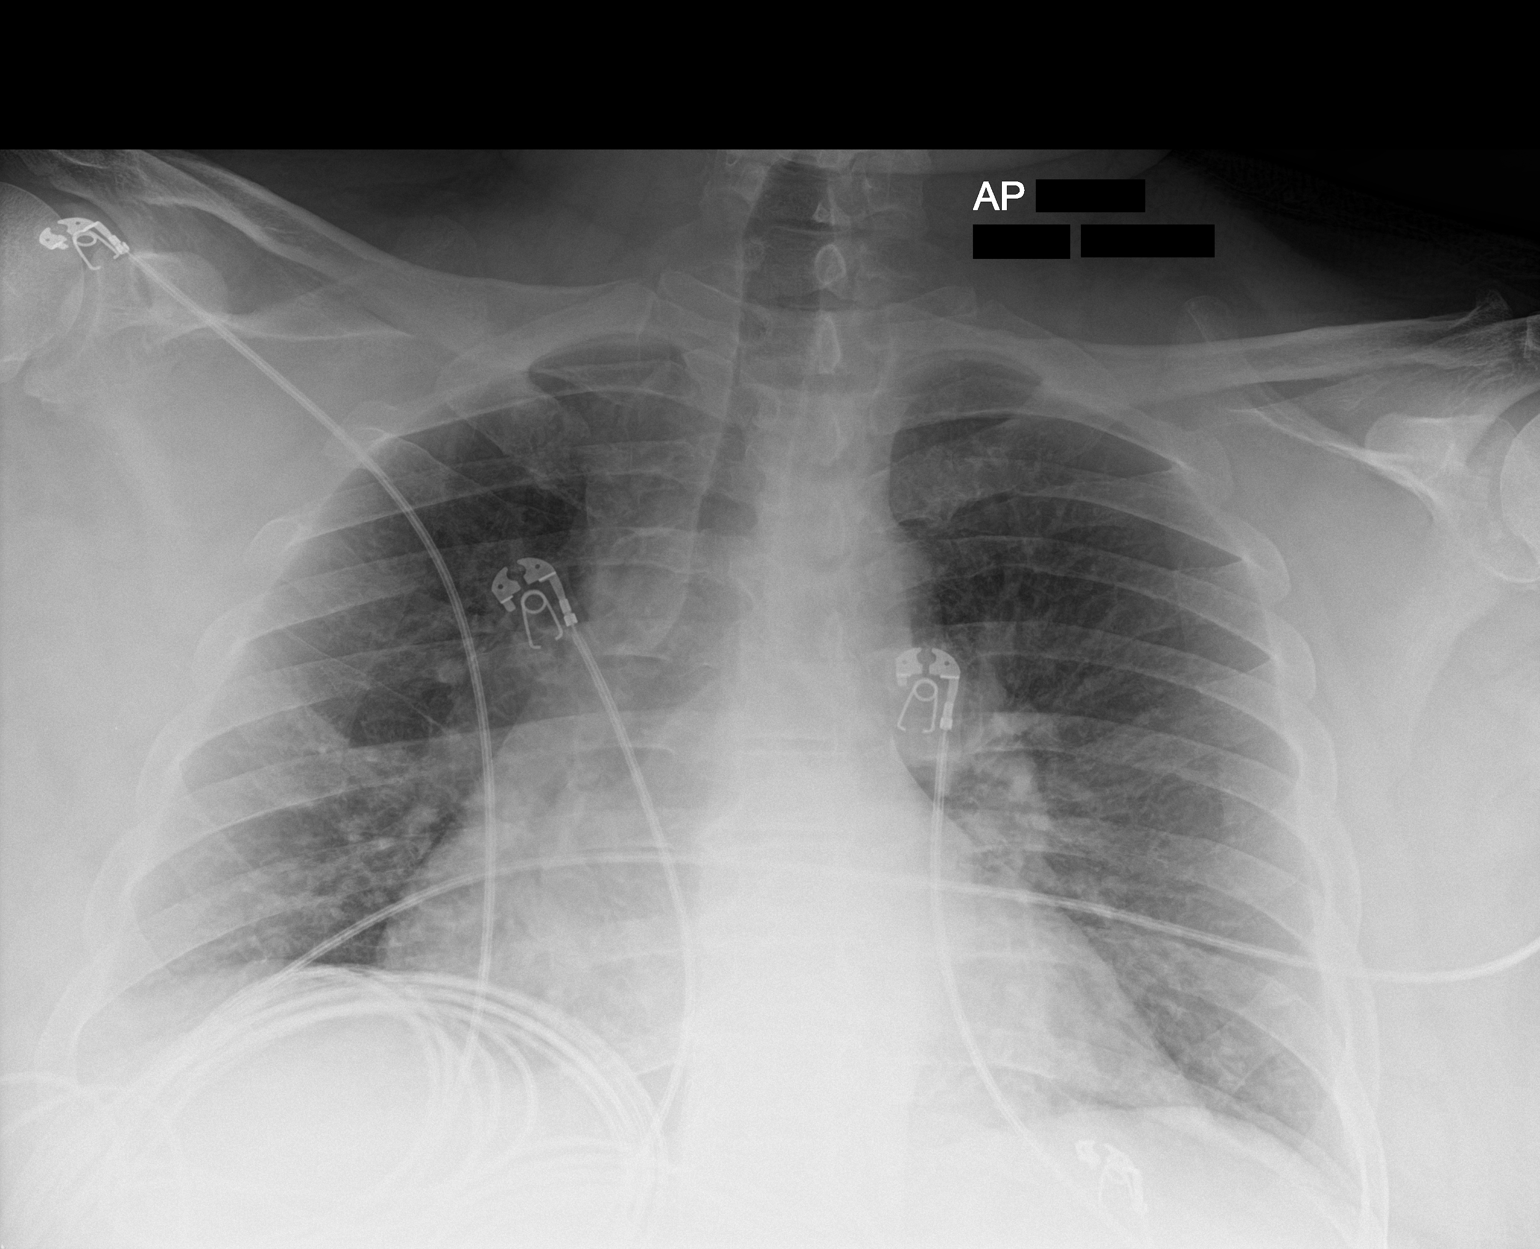

[1 of 1 positions shown; findings below may reference images not displayed]

FINDINGS: Decreased lung volumes. Mild cardiac enlargement. Increased
interstitial markings are identified within both lower lung zones.
No lobar consolidation.
IMPRESSION: 1. Increase interstitial markings noted within both lower lung
zones, nonspecific. Cannot rule out early viral infection.
2. No airspace consolidation
3. Mild cardiac enlargement.

## 2019-09-25 MED ORDER — METOPROLOL TARTRATE 5 MG/5ML IV SOLN
5.0000 mg | Freq: Once | INTRAVENOUS | Status: DC
  Filled 2019-09-25: qty 5

## 2019-09-25 MED ORDER — METOPROLOL TARTRATE 25 MG PO TABS: 25.0000 mg | ORAL_TABLET | Freq: Once | ORAL | Status: DC

## 2019-09-25 MED ORDER — ONDANSETRON 4 MG PO TBDP
4.0000 mg | ORAL_TABLET | Freq: Once | ORAL | Status: AC
  Administered 2019-09-25: 4 mg via ORAL
  Filled 2019-09-25: qty 1

## 2019-09-25 MED ORDER — METOPROLOL TARTRATE 25 MG PO TABS
25.0000 mg | ORAL_TABLET | Freq: Once | ORAL | Status: AC
  Administered 2019-09-25: 25 mg via ORAL
  Filled 2019-09-25: qty 1

## 2019-09-25 NOTE — ED Provider Notes (Signed)
Comparison for Dr. Roslynn Amble.  At time of transfer care, patient is awaiting results of delta troponin.  If second opponent is similar or improving, anticipate discharge home with outpatient close cardiology follow-up.  Secondary pain returned negative and less than the previous one.  Patient is feeling better other than still feeling fatigued.  He denies chest pain or shortness of breath.  Per plan of care, patient be discharged with PCP follow-up.  His EKG does not appear to show A. fib and his telemetry strip on my review does not show irregularity.  Patient be discharged home with close return precautions and the follow-up instructions.  Patient with questions or concerns and will follow up on his Covid test tomorrow.  Patient discharged in good condition.    Clinical Impression: 1. Acute viral syndrome   2. Fatigue, unspecified type     Disposition: Discharge  Condition: Good  I have discussed the results, Dx and Tx plan with the pt(& family if present). He/she/they expressed understanding and agree(s) with the plan. Discharge instructions discussed at great length. Strict return precautions discussed and pt &/or family have verbalized understanding of the instructions. No further questions at time of discharge.    New Prescriptions   No medications on file    Follow Up: Seward Carol, MD [address] [place] [address] [phone]     Your cardiologist          Adler Alton, Gwenyth Allegra, MD 09/25/19 [phone]

## 2019-09-25 NOTE — ED Notes (Signed)
ED Provider at bedside. 

## 2019-09-25 NOTE — ED Triage Notes (Signed)
Pt endorses sore throat earlier this week, and woke up fatigued this morning. Airway intact. Denies cough or exposure to sickness. Pt has DM. VSS.

## 2019-09-25 NOTE — ED Provider Notes (Signed)
[facility] EMERGENCY DEPARTMENT Provider Note   CSN: [id number] Arrival date & time: 09/25/19  6283     History Chief Complaint  Patient presents with  . Weakness  . Sore Throat    [name] is a 48 y.o. male.  Presents to ER with sore throat, generalized weakness.  Patient states over the last 2 days he noted sore throat, no difficulty in breathing, states this resolved this morning.  Today noted generalized weakness, decreased energy, fatigue.  No focal weakness, no difficulty in breathing.  No cough or sick contacts.  No fevers.  No chest pain.  Took flecainide this morning, metoprolol.  Past medical history paroxysmal A. fib on anticoagulation, flecainide and metoprolol, diabetes, hypertension, hyperlipidemia, morbid obesity, cocaine abuse.  Recent admission for NSTEMI, A. fib  HPI     Past Medical History:  Diagnosis Date  . Diabetes mellitus   . GERD (gastroesophageal reflux disease)   . History of alcohol abuse   . History of cocaine use   . History of echocardiogram    a. Echo 4/14: Moderate LVH, vigorous LVEF, EF 65-70%, normal wall motion, grade 2 diastolic dysfunction, mildly dilated aortic root and ascending aorta, ascending aorta 40 mm, aortic root 38 mm, mild LAE  . Hx of cardiovascular stress test    a. GXT 5/14: No ischemic changes  //  b. ETT-Myoview 3/16:  Low risk, no ischemia, EF 58%  . Hyperlipidemia   . Hypertension   . Morbid obesity ([place])   . Obesity   . Paroxysmal atrial fibrillation ([place])    Occurring in 2008, with several recurrence since then (including in the setting of + cocaine on UDS).  . Sleep apnea     Patient Active Problem List   Diagnosis Date Noted  . SVT (supraventricular tachycardia) ([place]) 07/08/2019  . Acute pancreatitis 01/22/2019  . Class 3 obesity due to excess calories with serious comorbidity and body mass index (BMI) of 40.0 to 44.9 in adult 12/08/2016  . PAF (paroxysmal atrial fibrillation)  ([place]) 01/23/2013  . Diabetes mellitus ([place]) 01/23/2013  . Hypertensive heart disease 01/23/2013  . Tobacco user 01/23/2013  . Chronic diastolic heart failure ([place]) 01/21/2013  . 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 Shoulder pain, right 02/15/2011  . Seasonal and perennial allergic rhinitis 02/13/2008  . Dyslipidemia 02/12/2008  . OBESITY, MORBID 02/12/2008  . COCAINE Abuse, past hx 02/12/2008  . Obstructive sleep apnea 02/12/2008    Past Surgical History:  Procedure Laterality Date  . APPENDECTOMY    . ROTATOR CUFF REPAIR    . TONSILLECTOMY         Family History  Problem Relation Age of Onset  . Diabetes Mother   . Heart attack Father 8  . Stroke Maternal Grandmother   . Stroke Paternal Grandmother   . Diabetes Paternal Grandfather     Social History   Tobacco Use  . Smoking status: Current Every Day Smoker    Packs/day: 1.00    Years: 28.00    Pack years: 28.00  . Smokeless tobacco: Former Systems developer    Types: Snuff  . Tobacco comment: smokes one pack per day 05/12/2019  Substance Use Topics  . Alcohol use: Yes    Comment: occasionally - 1 beer 1x/mont  . Drug use: No    Comment: cocaine in the past, none currently    Home Medications Prior to Admission medications   Medication Sig Start Date End Date Taking? Authorizing Provider  Blood Pressure  Monitoring (BLOOD PRESSURE MONITOR/L CUFF) MISC 1 Units by Does not apply route daily. 03/19/19   Tacy Learn, PA-C  flecainide (TAMBOCOR) 100 MG tablet TAKE 1 TABLET BY MOUTH TWICE A DAY 02/25/19   Jettie Booze, MD  glipiZIDE (GLUCOTROL) 10 MG tablet Take 10 mg by mouth 2 (two) times daily. 01/06/19   [provider]  lisinopril (PRINIVIL,ZESTRIL) 20 MG tablet Take 1 tablet (20 mg total) by mouth daily for 30 days. 01/24/19 07/21/19  Aline August, MD  metFORMIN (GLUCOPHAGE) 500 MG tablet Take 1,000 mg by mouth 2 (two) times daily. 01/06/19   [provider]  metoprolol succinate (TOPROL-XL) 25  MG 24 hr tablet TAKE 1 TABLET BY MOUTH EVERY DAY 02/25/19   Jettie Booze, MD  XARELTO 20 MG TABS tablet TAKE 1 TABLET (20 MG TOTAL) BY MOUTH DAILY WITH SUPPER. 08/21/19   Jettie Booze, MD    Allergies    Shrimp [shellfish allergy], Banana, Watermelon flavor, Peanut-containing drug products, Almond oil, and Sulfa antibiotics  Review of Systems   Review of Systems  Constitutional: Negative for chills and fever.  HENT: Negative for ear pain and sore throat.   Eyes: Negative for pain and visual disturbance.  Respiratory: Negative for cough and shortness of breath.   Cardiovascular: Negative for chest pain and palpitations.  Gastrointestinal: Negative for abdominal pain and vomiting.  Genitourinary: Negative for dysuria and hematuria.  Musculoskeletal: Negative for arthralgias and back pain.  Skin: Negative for color change and rash.  Neurological: Negative for seizures and syncope.  All other systems reviewed and are negative.   Physical Exam Updated Vital Signs BP 129/74   Pulse 65   Temp 98.4 F (36.9 C) (Oral)   Resp (!) 23   SpO2 99%   Physical Exam Vitals and nursing note reviewed.  Constitutional:      Appearance: He is well-developed.     Comments: obese  HENT:     Head: Normocephalic and atraumatic.  Eyes:     Conjunctiva/sclera: Conjunctivae normal.  Cardiovascular:     Rate and Rhythm: Normal rate and regular rhythm.     Heart sounds: No murmur.  Pulmonary:     Effort: Pulmonary effort is normal. No respiratory distress.     Breath sounds: Normal breath sounds.  Abdominal:     Palpations: Abdomen is soft.     Tenderness: There is no abdominal tenderness.  Musculoskeletal:     Cervical back: Neck supple.  Skin:    General: Skin is warm and dry.     Capillary Refill: Capillary refill takes less than 2 seconds.  Neurological:     General: No focal deficit present.     Mental Status: He is alert and oriented to person, place, and time.    Psychiatric:        Mood and Affect: Mood normal.        Behavior: Behavior normal.     ED Results / Procedures / Treatments   Labs (all labs ordered are listed, but only abnormal results are displayed) Labs Reviewed  COMPREHENSIVE METABOLIC PANEL - Abnormal; Notable for the following components:      Result Value   Glucose, Bld 168 (*)    BUN 27 (*)    Creatinine, Ser 1.63 (*)    Calcium 8.4 (*)    Albumin 3.1 (*)    GFR calc non Af Amer 49 (*)    GFR calc Af Amer 57 (*)    All  other components within normal limits  CBC WITH DIFFERENTIAL/PLATELET - Abnormal; Notable for the following components:   Hemoglobin 12.9 (*)    Monocytes Absolute 1.1 (*)    Eosinophils Absolute 0.8 (*)    All other components within normal limits  URINALYSIS, ROUTINE W REFLEX MICROSCOPIC - Abnormal; Notable for the following components:   APPearance CLOUDY (*)    Hgb urine dipstick MODERATE (*)    Protein, ur 100 (*)    Bacteria, UA RARE (*)    Non Squamous Epithelial 6-10 (*)    All other components within normal limits  BRAIN NATRIURETIC PEPTIDE - Abnormal; Notable for the following components:   B Natriuretic Peptide 140.3 (*)    All other components within normal limits  NOVEL CORONAVIRUS, NAA (HOSP ORDER, SEND-OUT TO REF LAB; TAT 18-24 HRS)  LACTIC ACID, PLASMA  POC SARS CORONAVIRUS 2 AG -  ED  TROPONIN I (HIGH SENSITIVITY)  TROPONIN I (HIGH SENSITIVITY)    EKG EKG Interpretation  Date/Time:  Thursday September 25 2019 14:34:22 EST Ventricular Rate:  70 PR Interval:    QRS Duration: 82 QT Interval:  387 QTC Calculation: 418 R Axis:   -29 Text Interpretation: Sinus rhythm Borderline left axis deviation Probable anteroseptal infarct When compared to prior ECG on same day, tachycadia has resolved no acute STEMI Confirmed by Madalyn Rob [phone]) on 09/25/2019 4:15:17 PM   Radiology DG Chest Portable 1 View  Result Date: 09/25/2019 CLINICAL DATA:  Cough and shortness of  breath. Evaluate for pneumonia. EXAM: PORTABLE CHEST 1 VIEW COMPARISON:  07/08/2019 FINDINGS: Decreased lung volumes. Mild cardiac enlargement. Increased interstitial markings are identified within both lower lung zones. No lobar consolidation. IMPRESSION: 1. Increase interstitial markings noted within both lower lung zones, nonspecific. Cannot rule out early viral infection. 2. No airspace consolidation 3. Mild cardiac enlargement. Electronically Signed   By: Kerby Moors [name]   On: 09/25/2019 11:48    Procedures Procedures (including critical care time)  Medications Ordered in ED Medications  metoprolol tartrate (LOPRESSOR) injection 5 mg (5 mg Intravenous Not Given 09/25/19 1333)  ondansetron (ZOFRAN-ODT) disintegrating tablet 4 mg (4 mg Oral Given 09/25/19 1144)  metoprolol tartrate (LOPRESSOR) tablet 25 mg (25 mg Oral Given 09/25/19 1334)    ED Course  I have reviewed the triage vital signs and the nursing notes.  Pertinent labs & imaging results that were available during my care of the patient were reviewed by me and considered in my medical decision making (see chart for details).  Clinical Course as of Sep 24 1704  Thu Sep 25, 2019  1235 POC SARS Coronavirus 2 Ag-ED - Nasal Swab (BD Veritor Kit) [RD]  1428 Rechecked, symptoms improved after eating sandwich, HR now 75, will check a repeat EKG   [RD]    Clinical Course User Index [RD] Lucrezia Starch, MD   MDM Rules/Calculators/A&P                            48 year old male presents to ER with complaint of generalized fatigue, recent sore throat.  On exam patient noted to be well-appearing, no distress.  Suspect acute viral illness as cause of patient's symptoms.  CXR without evidence for significant pneumonia.  No WBC elevation, no fever.  Rapid Covid negative, will send PCR Covid.  Patient not having any chest pain or shortness of breath but given his cardiac history and report of generalized fatigue and decreased  energy, check  EKG and troponins.  He initially was in a normal rate but then went into a sinus tachycardia versus A. fib with rate up to 140s.  Gave dose of home metoprolol p.o. and his rate dropped down to 70s and remained in normal sinus rhythm with reasonable rate er. troponin x2 within normal limits.  EKGs without acute ischemic changes when compared to old EKGs.  Given work-up today, stable vital signs, believe patient is appropriate for discharge and outpatient management this time.  I reviewed strict return precautions as well as isolation precautions until we have final results of his Covid PCR.  Will discharge home with plan for PCP and cardiology follow-up.    After the discussed management above, the patient was determined to be safe for discharge.  The patient was in agreement with this plan and all questions regarding their care were answered.  ED return precautions were discussed and the patient will return to the ED with any significant worsening of condition.   Final Clinical Impression(s) / ED Diagnoses Final diagnoses:  Acute viral syndrome    Rx / DC Orders ED Discharge Orders    None       Lucrezia Starch, MD 09/25/19 1705

## 2019-09-25 NOTE — ED Notes (Signed)
Attempted iv x2- unsuccessful. Have asked another RN to come attempt if unable will call IV team

## 2019-09-25 NOTE — Discharge Instructions (Signed)
Your work-up today was overall reassuring and your troponins, card enzymes, were both improved and negative.  Given your stability over 7 and half hours in the emergency department, we feel you are safe for discharge home.  Please follow-up with your cardiology and primary doctor for further management.  If any symptoms change or worsen, please return to nearest emergency department.

## 2019-09-26 LAB — NOVEL CORONAVIRUS, NAA (HOSP ORDER, SEND-OUT TO REF LAB; TAT 18-24 HRS): SARS-CoV-2, NAA: NOT DETECTED

## 2019-09-29 ENCOUNTER — Other Ambulatory Visit: Payer: Self-pay

## 2019-09-29 ENCOUNTER — Observation Stay (HOSPITAL_COMMUNITY)
Admission: EM | Admit: 2019-09-29 | Discharge: 2019-09-30 | Disposition: A | Discharge status: Home / Self Care | Payer: 59 | Attending: Internal Medicine | Admitting: Internal Medicine

## 2019-09-29 ENCOUNTER — Encounter (HOSPITAL_COMMUNITY): Payer: Self-pay | Admitting: Emergency Medicine

## 2019-09-29 ENCOUNTER — Emergency Department (HOSPITAL_COMMUNITY): Payer: 59

## 2019-09-29 DIAGNOSIS — G4730 Sleep apnea, unspecified: Secondary | ICD-10-CM | POA: Diagnosis not present

## 2019-09-29 DIAGNOSIS — Z6841 Body Mass Index (BMI) 40.0 and over, adult: Secondary | ICD-10-CM | POA: Diagnosis not present

## 2019-09-29 DIAGNOSIS — Z7984 Long term (current) use of oral hypoglycemic drugs: Secondary | ICD-10-CM | POA: Insufficient documentation

## 2019-09-29 DIAGNOSIS — Z7901 Long term (current) use of anticoagulants: Secondary | ICD-10-CM | POA: Diagnosis not present

## 2019-09-29 DIAGNOSIS — I130 Hypertensive heart and chronic kidney disease with heart failure and stage 1 through stage 4 chronic kidney disease, or unspecified chronic kidney disease: Secondary | ICD-10-CM | POA: Insufficient documentation

## 2019-09-29 DIAGNOSIS — R002 Palpitations: Secondary | ICD-10-CM | POA: Diagnosis not present

## 2019-09-29 DIAGNOSIS — F17210 Nicotine dependence, cigarettes, uncomplicated: Secondary | ICD-10-CM | POA: Diagnosis not present

## 2019-09-29 DIAGNOSIS — N183 Chronic kidney disease, stage 3 unspecified: Secondary | ICD-10-CM | POA: Diagnosis not present

## 2019-09-29 DIAGNOSIS — I480 Paroxysmal atrial fibrillation: Secondary | ICD-10-CM | POA: Insufficient documentation

## 2019-09-29 DIAGNOSIS — E118 Type 2 diabetes mellitus with unspecified complications: Secondary | ICD-10-CM

## 2019-09-29 DIAGNOSIS — R000 Tachycardia, unspecified: Secondary | ICD-10-CM

## 2019-09-29 DIAGNOSIS — I119 Hypertensive heart disease without heart failure: Secondary | ICD-10-CM

## 2019-09-29 DIAGNOSIS — J9601 Acute respiratory failure with hypoxia: Secondary | ICD-10-CM | POA: Diagnosis present

## 2019-09-29 DIAGNOSIS — R7989 Other specified abnormal findings of blood chemistry: Secondary | ICD-10-CM | POA: Diagnosis not present

## 2019-09-29 DIAGNOSIS — E119 Type 2 diabetes mellitus without complications: Secondary | ICD-10-CM

## 2019-09-29 DIAGNOSIS — I5032 Chronic diastolic (congestive) heart failure: Secondary | ICD-10-CM | POA: Insufficient documentation

## 2019-09-29 DIAGNOSIS — I4891 Unspecified atrial fibrillation: Secondary | ICD-10-CM | POA: Diagnosis present

## 2019-09-29 DIAGNOSIS — K219 Gastro-esophageal reflux disease without esophagitis: Secondary | ICD-10-CM | POA: Diagnosis not present

## 2019-09-29 DIAGNOSIS — Z20828 Contact with and (suspected) exposure to other viral communicable diseases: Secondary | ICD-10-CM | POA: Diagnosis not present

## 2019-09-29 DIAGNOSIS — Z79899 Other long term (current) drug therapy: Secondary | ICD-10-CM | POA: Diagnosis not present

## 2019-09-29 DIAGNOSIS — E1122 Type 2 diabetes mellitus with diabetic chronic kidney disease: Secondary | ICD-10-CM | POA: Insufficient documentation

## 2019-09-29 DIAGNOSIS — J189 Pneumonia, unspecified organism: Secondary | ICD-10-CM

## 2019-09-29 LAB — BASIC METABOLIC PANEL
Anion gap: 10 (ref 5–15)
BUN: 30 mg/dL — ABNORMAL HIGH (ref 6–20)
CO2: 24 mmol/L (ref 22–32)
Calcium: 8.4 mg/dL — ABNORMAL LOW (ref 8.9–10.3)
Chloride: 106 mmol/L (ref 98–111)
Creatinine, Ser: 1.46 mg/dL — ABNORMAL HIGH (ref 0.61–1.24)
GFR calc Af Amer: >60 mL/min (ref >60)
GFR calc non Af Amer: 56 mL/min — ABNORMAL LOW (ref >60)
Glucose, Bld: 213 mg/dL — ABNORMAL HIGH (ref 70–99)
Potassium: 4.8 mmol/L (ref 3.5–5.1)
Sodium: 140 mmol/L (ref 135–145)

## 2019-09-29 LAB — CBC
HCT: 38.7 % — ABNORMAL LOW (ref 39.0–52.0)
Hemoglobin: 12.6 g/dL — ABNORMAL LOW (ref 13.0–17.0)
MCH: 28.9 pg (ref 26.0–34.0)
MCHC: 32.6 g/dL (ref 30.0–36.0)
MCV: 88.8 fL (ref 80.0–100.0)
Platelets: 266 10*3/uL (ref 150–400)
RBC: 4.36 MIL/uL (ref 4.22–5.81)
RDW: 13.2 % (ref 11.5–15.5)
WBC: 13 10*3/uL — ABNORMAL HIGH (ref 4.0–10.5)
nRBC: 0 % (ref 0.0–0.2)

## 2019-09-29 LAB — TROPONIN I (HIGH SENSITIVITY): Troponin I (High Sensitivity): 15 ng/L (ref <18)

## 2019-09-29 LAB — BRAIN NATRIURETIC PEPTIDE: B Natriuretic Peptide: 222.2 pg/mL — ABNORMAL HIGH (ref 0.0–100.0)

## 2019-09-29 IMAGING — CR DG CHEST 2V
2 series · 2 of 2 positions shown · non-contrast
Comparison: Radiograph [DATE]

CLINICAL DATA: Tachycardia, shortness of breath

EXAM:
CHEST - 2 VIEW

[w chest pa]
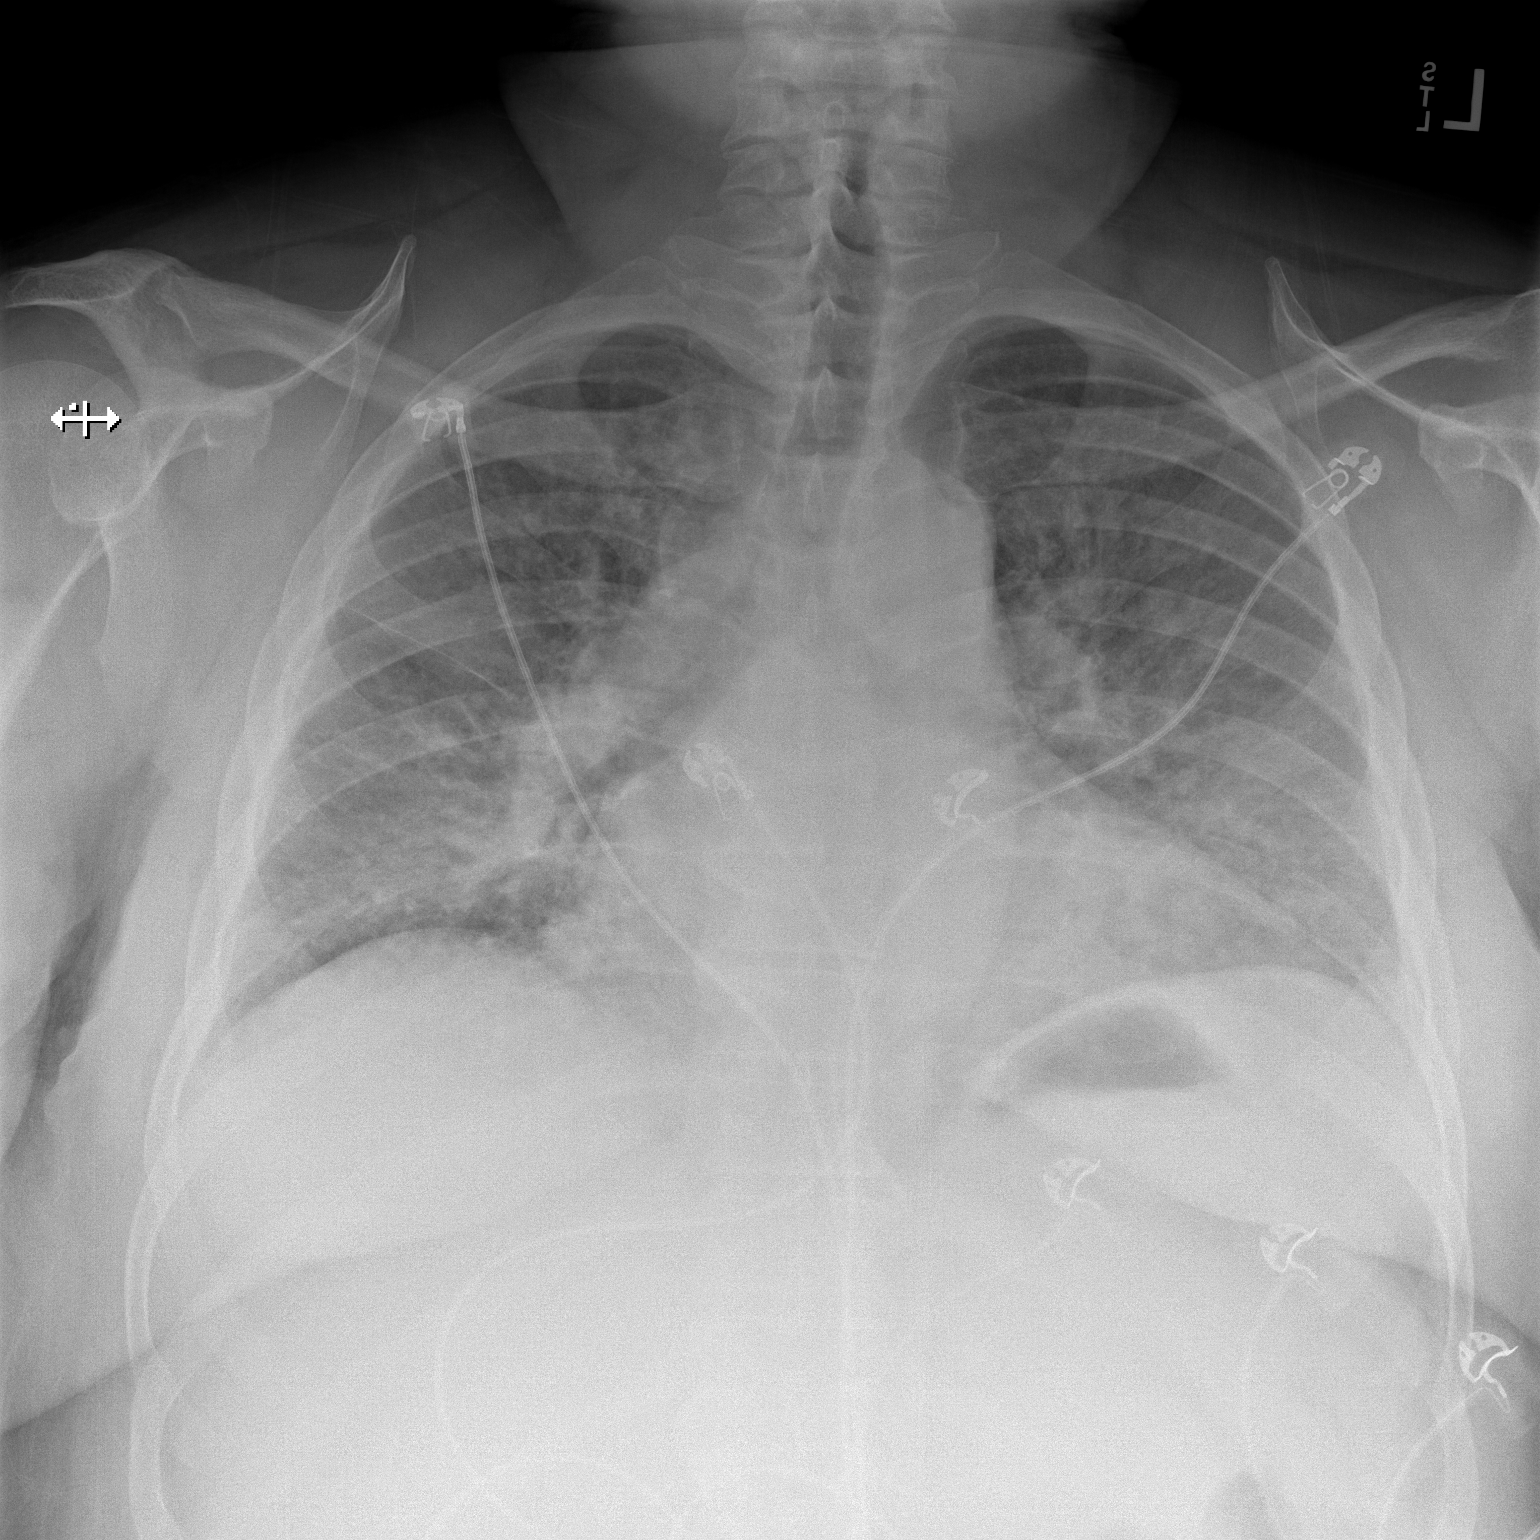

[w chest lat]
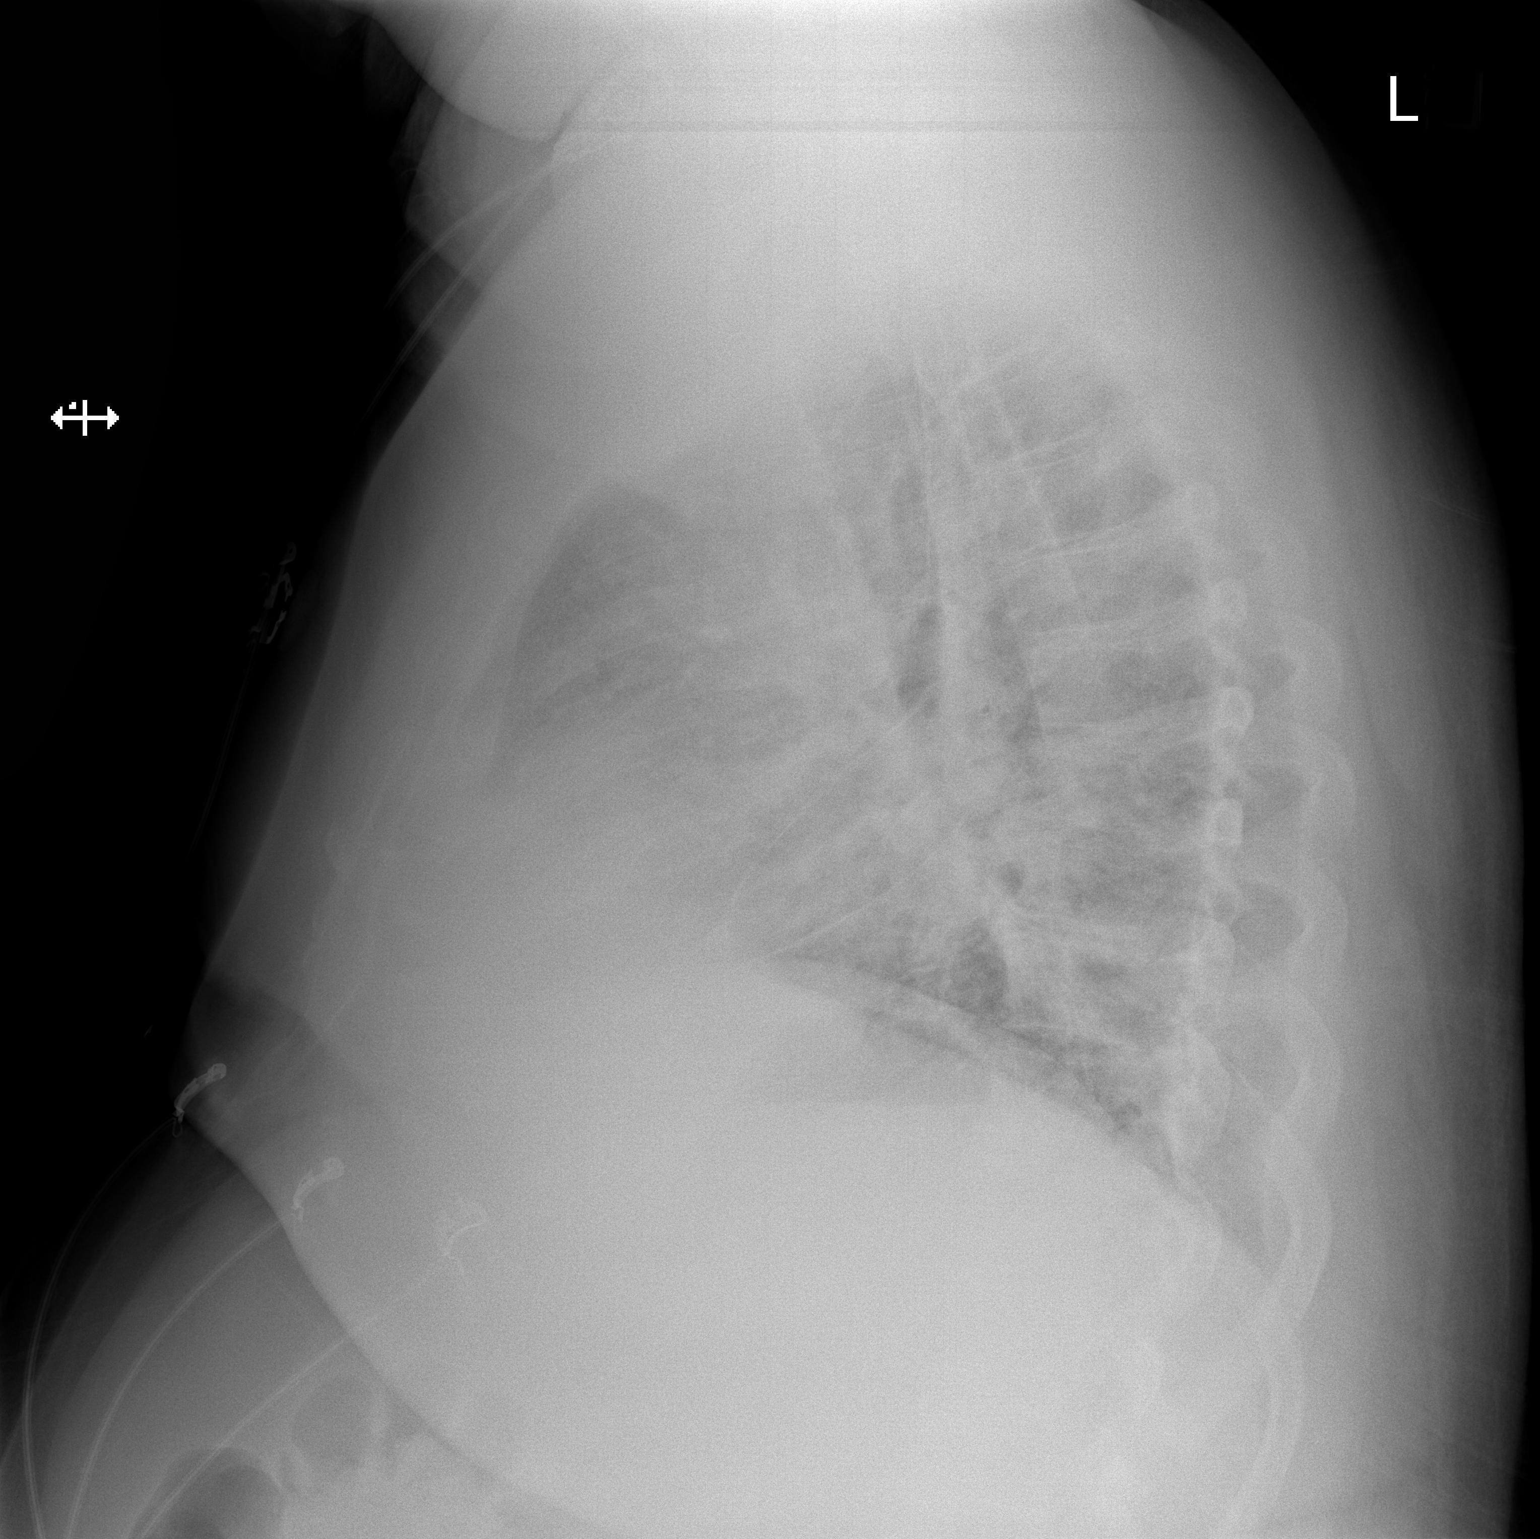

[2 of 2 positions shown; findings below may reference images not displayed]

FINDINGS: Multifocal interstitial and airspace opacities throughout both
lungs, perihilar haze and in the stent miss of pulmonary
vascularity. Cardiomegaly is similar to comparison radiographs. No
pneumothorax. No visible effusion. No acute osseous or soft tissue
abnormality.
IMPRESSION: 1. Multifocal interstitial and airspace opacities throughout both
lungs, consistent with pulmonary edema though infection could have a
similar appearance and should be excluded clinically.
2. Stable cardiomegaly.

## 2019-09-29 MED ORDER — SODIUM CHLORIDE 0.9 % IV SOLN
1.0000 g | Freq: Once | INTRAVENOUS | Status: AC
  Administered 2019-09-30: 1 g via INTRAVENOUS
  Filled 2019-09-29: qty 10

## 2019-09-29 MED ORDER — SODIUM CHLORIDE 0.9% FLUSH
3.0000 mL | Freq: Once | INTRAVENOUS | Status: AC
  Administered 2019-09-30: 3 mL via INTRAVENOUS

## 2019-09-29 NOTE — ED Provider Notes (Signed)
[facility] DEPT Provider Note   CSN: [id number] Arrival date & time: 09/29/19  2138     History Chief Complaint  Patient presents with  . Palpitations    [name] is a 48 y.o. male.  The history is provided by the patient. No language interpreter was used.  Palpitations Palpitations quality:  Fast Onset quality:  Sudden Timing:  Constant Progression:  Worsening Chronicity:  New Relieved by:  Nothing Worsened by:  Nothing Ineffective treatments:  None tried Associated symptoms: no shortness of breath   Risk factors: diabetes mellitus   Pt complains of heart racing and feeling short of breath. Pt has a history of afib and svt. Pt reports he was seen 4 days ago for the same.  Pt had a negative covid test.  Pt reports he was lifting a basket of laundry and he felt short of breath and his heart began racing      Past Medical History:  Diagnosis Date  . Diabetes mellitus   . GERD (gastroesophageal reflux disease)   . History of alcohol abuse   . History of cocaine use   . History of echocardiogram    a. Echo 4/14: Moderate LVH, vigorous LVEF, EF 65-70%, normal wall motion, grade 2 diastolic dysfunction, mildly dilated aortic root and ascending aorta, ascending aorta 40 mm, aortic root 38 mm, mild LAE  . Hx of cardiovascular stress test    a. GXT 5/14: No ischemic changes  //  b. ETT-Myoview 3/16:  Low risk, no ischemia, EF 58%  . Hyperlipidemia   . Hypertension   . Morbid obesity ([place])   . Obesity   . Paroxysmal atrial fibrillation ([place])    Occurring in 2008, with several recurrence since then (including in the setting of + cocaine on UDS).  . Sleep apnea     Patient Active Problem List   Diagnosis Date Noted  . SVT (supraventricular tachycardia) ([place]) 07/08/2019  . Acute pancreatitis 01/22/2019  . Class 3 obesity due to excess calories with serious comorbidity and body mass index (BMI) of 40.0 to 44.9 in adult 12/08/2016    . PAF (paroxysmal atrial fibrillation) ([place]) 01/23/2013  . Diabetes mellitus ([place]) 01/23/2013  . Hypertensive heart disease 01/23/2013  . Tobacco user 01/23/2013  . Chronic diastolic heart failure ([place]) 01/21/2013  . 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 Shoulder pain, right 02/15/2011  . Seasonal and perennial allergic rhinitis 02/13/2008  . Dyslipidemia 02/12/2008  . OBESITY, MORBID 02/12/2008  . COCAINE Abuse, past hx 02/12/2008  . Obstructive sleep apnea 02/12/2008    Past Surgical History:  Procedure Laterality Date  . APPENDECTOMY    . ROTATOR CUFF REPAIR    . TONSILLECTOMY         Family History  Problem Relation Age of Onset  . Diabetes Mother   . Heart attack Father 18  . Stroke Maternal Grandmother   . Stroke Paternal Grandmother   . Diabetes Paternal Grandfather     Social History   Tobacco Use  . Smoking status: Current Every Day Smoker    Packs/day: 1.00    Years: 28.00    Pack years: 28.00  . Smokeless tobacco: Former Systems developer    Types: Snuff  . Tobacco comment: smokes one pack per day 05/12/2019  Substance Use Topics  . Alcohol use: Yes    Comment: occasionally - 1 beer 1x/[place]  . Drug use: No    Comment: cocaine in the past, none currently  Home Medications Prior to Admission medications   Medication Sig Start Date End Date Taking? Authorizing Provider  atorvastatin (LIPITOR) 10 MG tablet Take 10 mg by mouth daily. 09/16/19  Yes [provider]  Blood Pressure Monitoring (BLOOD PRESSURE MONITOR/L CUFF) MISC 1 Units by Does not apply route daily. 03/19/19  Yes Jeannie Fend, PA-C  flecainide (TAMBOCOR) 100 MG tablet TAKE 1 TABLET BY MOUTH TWICE A DAY Patient taking differently: Take 100 mg by mouth 2 (two) times daily.  02/25/19  Yes Corky Crafts, MD  glipiZIDE (GLUCOTROL) 10 MG tablet Take 10 mg by mouth 2 (two) times daily. 01/06/19  Yes [provider]  ibuprofen (ADVIL) 200 MG tablet Take 400 mg by mouth every 6  (six) hours as needed for headache, mild pain, moderate pain or cramping.   Yes [provider]  lisinopril (PRINIVIL,ZESTRIL) 20 MG tablet Take 1 tablet (20 mg total) by mouth daily for 30 days. 01/24/19 09/29/19 Yes Glade Lloyd, MD  metFORMIN (GLUCOPHAGE) 500 MG tablet Take 1,000 mg by mouth 2 (two) times daily. 01/06/19  Yes [provider]  metoprolol succinate (TOPROL-XL) 25 MG 24 hr tablet TAKE 1 TABLET BY MOUTH EVERY DAY Patient taking differently: Take 25 mg by mouth daily.  02/25/19  Yes Varanasi, Donnie Coffin, MD  XARELTO 20 MG TABS tablet TAKE 1 TABLET (20 MG TOTAL) BY MOUTH DAILY WITH SUPPER. Patient taking differently: Take 20 mg by mouth daily with supper.  08/21/19  Yes Corky Crafts, MD    Allergies    Shrimp [shellfish allergy], Banana, Watermelon flavor, Peanut-containing drug products, Almond oil, and Sulfa antibiotics  Review of Systems   Review of Systems  Respiratory: Negative for shortness of breath.   Cardiovascular: Positive for palpitations.  All other systems reviewed and are negative.   Physical Exam Updated Vital Signs BP 133/87 (BP Location: Left Arm)   Pulse (!) 160   Temp 97.9 F (36.6 C) (Oral)   Resp 20   Ht 5\' 4"  (1.626 m)   Wt (!) 137.9 kg   SpO2 95%   BMI 52.18 kg/m   Physical Exam Vitals and nursing note reviewed.  Constitutional:      Appearance: He is well-developed.  HENT:     Head: Normocephalic and atraumatic.  Eyes:     Conjunctiva/sclera: Conjunctivae normal.  Cardiovascular:     Rate and Rhythm: Normal rate and regular rhythm.     Heart sounds: No murmur.  Pulmonary:     Effort: Pulmonary effort is normal. No respiratory distress.     Breath sounds: Normal breath sounds.  Abdominal:     Palpations: Abdomen is soft.     Tenderness: There is no abdominal tenderness.  Musculoskeletal:        General: Normal range of motion.     Cervical back: Neck supple.  Skin:    General: Skin is warm and dry.   Neurological:     General: No focal deficit present.     Mental Status: He is alert.  Psychiatric:        Mood and Affect: Mood normal.     ED Results / Procedures / Treatments   Labs (all labs ordered are listed, but only abnormal results are displayed) Labs Reviewed  CBC - Abnormal; Notable for the following components:      Result Value   WBC 13.0 (*)    Hemoglobin 12.6 (*)    HCT 38.7 (*)    All other components  within normal limits  BASIC METABOLIC PANEL  TROPONIN I (HIGH SENSITIVITY)    EKG None  Radiology DG Chest 2 View  Result Date: 09/29/2019 CLINICAL DATA:  Tachycardia, shortness of breath EXAM: CHEST - 2 VIEW COMPARISON:  Radiograph 09/25/2019 FINDINGS: Multifocal interstitial and airspace opacities throughout both lungs, perihilar haze and in the stent miss of pulmonary vascularity. Cardiomegaly is similar to comparison radiographs. No pneumothorax. No visible effusion. No acute osseous or soft tissue abnormality. IMPRESSION: 1. Multifocal interstitial and airspace opacities throughout both lungs, consistent with pulmonary edema though infection could have a similar appearance and should be excluded clinically. 2. Stable cardiomegaly. Electronically Signed   By: Kreg ShropshirePrice  DeHay [name]   On: 09/29/2019 22:19    Procedures Procedures (including critical care time)  Medications Ordered in ED Medications  sodium chloride flush (NS) 0.9 % injection 3 mL (has no administration in time range)    ED Course  I have reviewed the triage vital signs and the nursing notes.  Pertinent labs & imaging results that were available during my care of the patient were reviewed by me and considered in my medical decision making (see chart for details).    MDM Rules/Calculators/A&P                      MDM  Pt's initial heart rate on EKg was 160. Pt  77 at the time of my evaluation.  I spoke to Hospitalist who will see for admission  Final Clinical Impression(s) / ED  Diagnoses Final diagnoses:  Community acquired pneumonia, unspecified laterality  Tachycardia    Rx / DC Orders ED Discharge Orders    None       Osie CheeksSofia, Casten Floren K, PA-C 09/30/19 0015    Wynetta FinesMessick, Peter C, MD 10/09/19 [phone]

## 2019-09-29 NOTE — ED Triage Notes (Addendum)
Patient states he was feeling sob, tightness of chest , and tired. Patient states this started this morning. Patient states when he laid down for bed, it got worse. Hx of A fib.

## 2019-09-30 ENCOUNTER — Encounter (HOSPITAL_COMMUNITY): Payer: Self-pay | Admitting: Internal Medicine

## 2019-09-30 DIAGNOSIS — J189 Pneumonia, unspecified organism: Secondary | ICD-10-CM | POA: Diagnosis not present

## 2019-09-30 DIAGNOSIS — E1165 Type 2 diabetes mellitus with hyperglycemia: Secondary | ICD-10-CM | POA: Diagnosis not present

## 2019-09-30 DIAGNOSIS — J9601 Acute respiratory failure with hypoxia: Secondary | ICD-10-CM | POA: Diagnosis not present

## 2019-09-30 DIAGNOSIS — I471 Supraventricular tachycardia: Secondary | ICD-10-CM | POA: Diagnosis not present

## 2019-09-30 DIAGNOSIS — I480 Paroxysmal atrial fibrillation: Secondary | ICD-10-CM | POA: Diagnosis not present

## 2019-09-30 DIAGNOSIS — R002 Palpitations: Secondary | ICD-10-CM | POA: Diagnosis present

## 2019-09-30 DIAGNOSIS — I5032 Chronic diastolic (congestive) heart failure: Secondary | ICD-10-CM

## 2019-09-30 LAB — CBC
HCT: 36.2 % — ABNORMAL LOW (ref 39.0–52.0)
Hemoglobin: 11.6 g/dL — ABNORMAL LOW (ref 13.0–17.0)
MCH: 28.6 pg (ref 26.0–34.0)
MCHC: 32 g/dL (ref 30.0–36.0)
MCV: 89.2 fL (ref 80.0–100.0)
Platelets: 262 10*3/uL (ref 150–400)
RBC: 4.06 MIL/uL — ABNORMAL LOW (ref 4.22–5.81)
RDW: 13.2 % (ref 11.5–15.5)
WBC: 10.7 10*3/uL — ABNORMAL HIGH (ref 4.0–10.5)
nRBC: 0 % (ref 0.0–0.2)

## 2019-09-30 LAB — CBG MONITORING, ED: Glucose-Capillary: 174 mg/dL — ABNORMAL HIGH (ref 70–99)

## 2019-09-30 LAB — RAPID URINE DRUG SCREEN, HOSP PERFORMED
Amphetamines: NOT DETECTED
Barbiturates: NOT DETECTED
Benzodiazepines: NOT DETECTED
Cocaine: NOT DETECTED
Opiates: NOT DETECTED
Tetrahydrocannabinol: NOT DETECTED

## 2019-09-30 LAB — MAGNESIUM: Magnesium: 1.9 mg/dL (ref 1.7–2.4)

## 2019-09-30 LAB — COMPREHENSIVE METABOLIC PANEL
ALT: 24 U/L (ref 0–44)
AST: 22 U/L (ref 15–41)
Albumin: 3.3 g/dL — ABNORMAL LOW (ref 3.5–5.0)
Alkaline Phosphatase: 76 U/L (ref 38–126)
Anion gap: 7 (ref 5–15)
BUN: 26 mg/dL — ABNORMAL HIGH (ref 6–20)
CO2: 24 mmol/L (ref 22–32)
Calcium: 8.1 mg/dL — ABNORMAL LOW (ref 8.9–10.3)
Chloride: 109 mmol/L (ref 98–111)
Creatinine, Ser: 1.14 mg/dL (ref 0.61–1.24)
GFR calc Af Amer: >60 mL/min (ref >60)
GFR calc non Af Amer: >60 mL/min (ref >60)
Glucose, Bld: 142 mg/dL — ABNORMAL HIGH (ref 70–99)
Potassium: 4.6 mmol/L (ref 3.5–5.1)
Sodium: 140 mmol/L (ref 135–145)
Total Bilirubin: 0.5 mg/dL (ref 0.3–1.2)
Total Protein: 6.6 g/dL (ref 6.5–8.1)

## 2019-09-30 LAB — TROPONIN I (HIGH SENSITIVITY)
Troponin I (High Sensitivity): 14 ng/L (ref <18)
Troponin I (High Sensitivity): 21 ng/L — ABNORMAL HIGH (ref <18)

## 2019-09-30 LAB — D-DIMER, QUANTITATIVE: D-Dimer, Quant: 0.55 ug/mL-FEU — ABNORMAL HIGH (ref 0.00–0.50)

## 2019-09-30 LAB — PROCALCITONIN: Procalcitonin: <0.1 ng/mL

## 2019-09-30 LAB — FERRITIN: Ferritin: 167 ng/mL (ref 24–336)

## 2019-09-30 LAB — SARS CORONAVIRUS 2 (TAT 6-24 HRS): SARS Coronavirus 2: NEGATIVE

## 2019-09-30 LAB — C-REACTIVE PROTEIN: CRP: 1.5 mg/dL — ABNORMAL HIGH (ref <1.0)

## 2019-09-30 LAB — TSH: TSH: 2.069 u[IU]/mL (ref 0.350–4.500)

## 2019-09-30 MED ORDER — SODIUM CHLORIDE 0.9 % IV SOLN: 2.0000 g | INTRAVENOUS | Status: DC

## 2019-09-30 MED ORDER — ONDANSETRON HCL 4 MG PO TABS: 4.0000 mg | ORAL_TABLET | Freq: Four times a day (QID) | ORAL | Status: DC | PRN

## 2019-09-30 MED ORDER — ATORVASTATIN CALCIUM 10 MG PO TABS
10.0000 mg | ORAL_TABLET | Freq: Every day | ORAL | Status: DC
  Administered 2019-09-30: 10 mg via ORAL
  Filled 2019-09-30: qty 1

## 2019-09-30 MED ORDER — INSULIN ASPART 100 UNIT/ML ~~LOC~~ SOLN
0.0000 [IU] | Freq: Three times a day (TID) | SUBCUTANEOUS | Status: DC
  Administered 2019-09-30: 2 [IU] via SUBCUTANEOUS
  Filled 2019-09-30: qty 0.09

## 2019-09-30 MED ORDER — FLECAINIDE ACETATE 100 MG PO TABS
100.0000 mg | ORAL_TABLET | Freq: Two times a day (BID) | ORAL | Status: DC
  Administered 2019-09-30: 100 mg via ORAL
  Filled 2019-09-30 (×2): qty 1

## 2019-09-30 MED ORDER — ACETAMINOPHEN 650 MG RE SUPP: 650.0000 mg | Freq: Four times a day (QID) | RECTAL | Status: DC | PRN

## 2019-09-30 MED ORDER — ACETAMINOPHEN 325 MG PO TABS: 650.0000 mg | ORAL_TABLET | Freq: Four times a day (QID) | ORAL | Status: DC | PRN

## 2019-09-30 MED ORDER — METOPROLOL SUCCINATE ER 25 MG PO TB24
25.0000 mg | ORAL_TABLET | Freq: Every day | ORAL | Status: DC
  Administered 2019-09-30: 25 mg via ORAL
  Filled 2019-09-30: qty 1

## 2019-09-30 MED ORDER — METOPROLOL SUCCINATE ER 25 MG PO TB24: 50.0000 mg | ORAL_TABLET | Freq: Every day | ORAL | Status: DC

## 2019-09-30 MED ORDER — RIVAROXABAN 20 MG PO TABS: 20.0000 mg | ORAL_TABLET | Freq: Every day | ORAL | Status: DC

## 2019-09-30 MED ORDER — LISINOPRIL 20 MG PO TABS
20.0000 mg | ORAL_TABLET | Freq: Every day | ORAL | Status: DC
  Administered 2019-09-30: 20 mg via ORAL
  Filled 2019-09-30: qty 1

## 2019-09-30 MED ORDER — SODIUM CHLORIDE 0.9 % IV SOLN
100.0000 mg | Freq: Two times a day (BID) | INTRAVENOUS | Status: DC
  Administered 2019-09-30 (×2): 100 mg via INTRAVENOUS
  Filled 2019-09-30 (×3): qty 100

## 2019-09-30 MED ORDER — HYDRALAZINE HCL 20 MG/ML IJ SOLN
10.0000 mg | Freq: Once | INTRAMUSCULAR | Status: AC
  Administered 2019-09-30: 10 mg via INTRAVENOUS
  Filled 2019-09-30: qty 1

## 2019-09-30 MED ORDER — RIVAROXABAN 20 MG PO TABS
20.0000 mg | ORAL_TABLET | Freq: Every day | ORAL | Status: DC
  Administered 2019-09-30: 20 mg via ORAL
  Filled 2019-09-30 (×2): qty 1

## 2019-09-30 MED ORDER — ONDANSETRON HCL 4 MG/2ML IJ SOLN: 4.0000 mg | Freq: Four times a day (QID) | INTRAMUSCULAR | Status: DC | PRN

## 2019-09-30 NOTE — Discharge Summary (Signed)
Discharge Summary  [name] DXA:[id number] DOB: June 01, 1971  PCP: Renford Dills, MD  Admit date: 09/29/2019 Discharge date: 09/30/2019  Time spent: 30 mins  Recommendations for Outpatient Follow-up:  1. Cardiology-EP follow-up on 10/01/2019 at 10:45 AM 2. PCP in 1 week  Discharge Diagnoses:  Active Hospital Problems   Diagnosis Date Noted  . Acute respiratory failure with hypoxia (HCC) 09/30/2019  . Palpitations 09/30/2019  . PAF (paroxysmal atrial fibrillation) (HCC) 01/23/2013  . Diabetes mellitus (HCC) 01/23/2013  . Chronic diastolic heart failure (HCC) 01/21/2013  . Community acquired pneumonia 01/20/2013    Resolved Hospital Problems  No resolved problems to display.    Discharge Condition: Stable  Diet recommendation: Heart healthy, moderate carb  Vitals:   09/30/19 1330 09/30/19 1446  BP: (!) 176/91 (!) 176/91  Pulse: 77 77  Resp: 18 18  Temp:    SpO2: 99% 99%    History of present illness:  [name] is a 48 y.o. male with history of atrial fibrillation, SVT, diastolic heart failure, morbid obesity, diabetes mellitus, previous history of drug abuse who was recently admitted in September 2020 for SVT at the time patient also was placed on anticoagulation and flecainide takes beta-blocker started having shortness of breath since yesterday morning with palpitation.  Denies any chest pain, cough, fever chills nausea vomiting diarrhea.  Had a similar episode few days ago and had come to the ER and at the time and patient was given metoprolol symptoms resolved. In the ED, initial EKG shows likely SVT heart rate of 160 bpm which resolved after taking extra dose of metoprolol before coming to the ER. Chest x-ray showed multifocal infiltrates concerning for pulmonary edema patient is not hypoxic otherwise.  Labs show creatinine about 1.4 BNP 222 high sensitive troponin was 15 and 14 WBC count was 13 hemoglobin 12.6.  COVID-19 test negative. Patient  was started on empiric antibiotics for possible pneumonia. Cardiology consulted.     Today, patient reports feeling better, was able to ambulate to the bathroom and back without any significant shortness of breath, denies any chest pain, denies any cough, no fever/chills noted, denies any abdominal pain, nausea/vomiting/diarrhea.  Cardiology evaluated patient today, recommended increasing metoprolol and discharging patient from their perspective.  A close virtual follow-up visit with Dr. Johney Frame, EP is set for 10/01/2019.  Patient eager to be discharged, reports feeling much better.  Hospital Course:  Principal Problem:   Acute respiratory failure with hypoxia (HCC) Active Problems:   Community acquired pneumonia   Chronic diastolic heart failure (HCC)   PAF (paroxysmal atrial fibrillation) (HCC)   Diabetes mellitus (HCC)   Palpitations  Paroxysmal SVT/paroxysmal A. fib Currently in sinus rhythm Patient reports he usually feels short of breath/weak when he goes out of rhythm ??Medication/diet compliance UDS negative Telemetry now showing sinus rhythm Cardiology consulted recommended increasing metoprolol and discharging patient from their perspective.  A close virtual follow-up visit with Dr. Johney Frame, EP is set for 10/01/2019 Continue flecainide, increase metoprolol, continue Xarelto Follow-up closely with cardiology  Chronic diastolic HF Appears euvolemic, although chest x-ray showed interstitial opacities throughout the lungs consistent with pulmonary edema Of note, previous chest x-rays have shown similar images Troponins negative Echo with normal EF 60 to 65% in September 2020 Cardiology on board, close follow-up as mentioned above May benefit from ??outpatient diuresis  Unlikely CAP Currently afebrile, with resolving leukocytosis Asymptomatic, only mild shortness of breath Procalcitonin negative Covid test negative Chest x-ray as above No need for further  antibiotics Follow-up with PCP  D-dimer mildly elevated Not tachycardic, 100% on room air Low suspicion for PE (already on Xarelto)  Hypertension  Uncontrolled Advised medication and diet compliance Continue lisinopril, metoprolol Cardiology/PCP to reevaluate for better control  Diabetes mellitus type 2 Continue home regimen  CKD stage III Follow-up with PCP  Morbid obesity Lifestyle modification advised          Malnutrition Type:      Malnutrition Characteristics:      Nutrition Interventions:      Estimated body mass index is 52.18 kg/m as calculated from the following:   Height as of this encounter:  (1.626 m).   Weight as of this encounter: 137.9 kg.    Procedures:  None  Consultations:  Cardiology  Discharge Exam: BP (!) 176/91   Pulse 77   Temp 97.9 F (36.6 C) (Oral)   Resp 18   Ht  (1.626 m)   Wt (!) 137.9 kg   SpO2 99%   BMI 52.18 kg/m   General: NAD, morbidly obese Cardiovascular: S1, S2 present Respiratory: Mild bibasilar crackles noted  Discharge Instructions You were cared for by a hospitalist during your hospital stay. If you have any questions about your discharge medications or the care you received while you were in the hospital after you are discharged, you can call the unit and asked to speak with the hospitalist on call if the hospitalist that took care of you is not available. Once you are discharged, your primary care physician will handle any further medical issues. Please note that NO REFILLS for any discharge medications will be authorized once you are discharged, as it is imperative that you return to your primary care physician (or establish a relationship with a primary care physician if you do not have one) for your aftercare needs so that they can reassess your need for medications and monitor your lab values.  Discharge Instructions    Diet - low sodium heart healthy   Complete by: As directed     Increase activity slowly   Complete by: As directed      Allergies as of 09/30/2019      Reactions   Shrimp [shellfish Allergy] Anaphylaxis   Banana Other (See Comments)   Pt gags   Watermelon Flavor Nausea And Vomiting   Peanut-containing Drug Products Itching, Cough   Almond Oil Itching   Roof of mouth itches   Sulfa Antibiotics Itching      Medication List    STOP taking these medications   ibuprofen 200 MG tablet Commonly known as: ADVIL     TAKE these medications   atorvastatin 10 MG tablet Commonly known as: LIPITOR Take 10 mg by mouth daily.   Blood Pressure Monitor/L Cuff Misc 1 Units by Does not apply route daily.   flecainide 100 MG tablet Commonly known as: TAMBOCOR TAKE 1 TABLET BY MOUTH TWICE A DAY   glipiZIDE 10 MG tablet Commonly known as: GLUCOTROL Take 10 mg by mouth 2 (two) times daily.   lisinopril 20 MG tablet Commonly known as: ZESTRIL Take 1 tablet (20 mg total) by mouth daily for 30 days.   metFORMIN 500 MG tablet Commonly known as: GLUCOPHAGE Take 1,000 mg by mouth 2 (two) times daily.   metoprolol succinate 25 MG 24 hr tablet Commonly known as: TOPROL-XL Take 2 tablets (50 mg total) by mouth daily. What changed: how much to take   Xarelto 20 MG Tabs tablet Generic drug: rivaroxaban TAKE  1 TABLET (20 MG TOTAL) BY MOUTH DAILY WITH SUPPER. What changed: See the new instructions.      Allergies  Allergen Reactions  . Shrimp [Shellfish Allergy] Anaphylaxis  . Banana Other (See Comments)    Pt gags  . Watermelon Flavor Nausea And Vomiting  . Peanut-Containing Drug Products Itching and Cough  . Almond Oil Itching    Roof of mouth itches  . Sulfa Antibiotics Itching   Follow-up Information    Thompson Grayer, MD Follow up on 10/13/2019.   Specialty: Cardiology Why: @10 :45 am virtual office visit  Contact information: [address] [address] [phone]        Seward Carol, MD. Schedule an  appointment as soon as possible for a visit in 1 week(s).   Specialty: Internal Medicine Contact information: [address] [place] [address] [phone]        Jettie Booze, MD .   Specialties: Cardiology, Radiology, Interventional Cardiology Contact information: [address] [name] [address] [phone]            The results of significant diagnostics from this hospitalization (including imaging, microbiology, ancillary and laboratory) are listed below for reference.    Significant Diagnostic Studies: DG Chest 2 View  Result Date: 09/29/2019 CLINICAL DATA:  Tachycardia, shortness of breath EXAM: CHEST - 2 VIEW COMPARISON:  Radiograph 09/25/2019 FINDINGS: Multifocal interstitial and airspace opacities throughout both lungs, perihilar haze and in the stent miss of pulmonary vascularity. Cardiomegaly is similar to comparison radiographs. No pneumothorax. No visible effusion. No acute osseous or soft tissue abnormality. IMPRESSION: 1. Multifocal interstitial and airspace opacities throughout both lungs, consistent with pulmonary edema though infection could have a similar appearance and should be excluded clinically. 2. Stable cardiomegaly. Electronically Signed   By: Lovena Le [name]   On: 09/29/2019 22:19   DG Chest Portable 1 View  Result Date: 09/25/2019 CLINICAL DATA:  Cough and shortness of breath. Evaluate for pneumonia. EXAM: PORTABLE CHEST 1 VIEW COMPARISON:  07/08/2019 FINDINGS: Decreased lung volumes. Mild cardiac enlargement. Increased interstitial markings are identified within both lower lung zones. No lobar consolidation. IMPRESSION: 1. Increase interstitial markings noted within both lower lung zones, nonspecific. Cannot rule out early viral infection. 2. No airspace consolidation 3. Mild cardiac enlargement. Electronically Signed   By: Kerby Moors [name]   On: 09/25/2019 11:48    Microbiology: Recent Results  (from the past 240 hour(s))  Novel Coronavirus, NAA (Hosp order, Send-out to Ref Lab; TAT 18-24 hrs     Status: None   Collection Time: 09/25/19  1:34 PM   Specimen: Nasopharyngeal Swab; Respiratory  Result Value Ref Range Status   SARS-CoV-2, NAA NOT DETECTED NOT DETECTED Final    Comment: (NOTE) This nucleic acid amplification test was developed and its performance characteristics determined by [employer]. Nucleic acid amplification tests include PCR and TMA. This test has not been FDA cleared or approved. This test has been authorized by FDA under an Emergency Use Authorization (EUA). This test is only authorized for the duration of time the declaration that circumstances exist justifying the authorization of the emergency use of in vitro diagnostic tests for detection of SARS-CoV-2 virus and/or diagnosis of COVID-19 infection under section 564(b)(1) of the Act, 21 U.S.C. 761PJK-9(T) (1), unless the authorization is terminated or revoked sooner. When diagnostic testing is negative, the possibility of a false negative result should be considered in the context of a patient's recent  exposures and the presence of clinical signs and symptoms consistent with COVID-19. An individual without symptoms of COVID- 19 and who is not shedding SARS-CoV-2 vi rus would expect to have a negative (not detected) result in this assay. Performed At: [facility]  [address] [place], [place] [id number] Jolene SchimkeNagendra Sanjai MD WU:[id number]:[phone]    Coronavirus Source NASOPHARYNGEAL  Final    Comment: Performed at [facility], [address] [place], [place] [address]  SARS CORONAVIRUS 2 (TAT 6-24 HRS) Nasopharyngeal Nasopharyngeal Swab     Status: None   Collection Time: 09/30/19 12:17 AM   Specimen: Nasopharyngeal Swab  Result Value Ref Range Status   SARS Coronavirus 2 NEGATIVE NEGATIVE Final    Comment: (NOTE) SARS-CoV-2 target nucleic acids are NOT DETECTED. The SARS-CoV-2 RNA  is generally detectable in upper and lower respiratory specimens during the acute phase of infection. Negative results do not preclude SARS-CoV-2 infection, do not rule out co-infections with other pathogens, and should not be used as the sole basis for treatment or other patient management decisions. Negative results must be combined with clinical observations, patient history, and epidemiological information. The expected result is Negative. Fact Sheet for Patients: HairSlick.nohttps://www.fda.gov/media/138098/download Fact Sheet for Healthcare Providers: quierodirigir.comhttps://www.fda.gov/media/138095/download This test is not yet approved or cleared by the [place] FDA and  has been authorized for detection and/or diagnosis of SARS-CoV-2 by FDA under an Emergency Use Authorization (EUA). This EUA will remain  in effect (meaning this test can be used) for the duration of the COVID-19 declaration under Section 56 4(b)(1) of the Act, 21 U.S.C. section 360bbb-3(b)(1), unless the authorization is terminated or revoked sooner. Performed at [facility], [address] [place], [place] [address]      Labs: Basic Metabolic Panel: Recent Labs  Lab 09/25/19 1022 09/29/19 2227 09/30/19 0604  NA 139 140 140  K 5.0 4.8 4.6  CL 109 106 109  CO2 23 24 24   GLUCOSE 168* 213* 142*  BUN 27* 30* 26*  CREATININE 1.63* 1.46* 1.14  CALCIUM 8.4* 8.4* 8.1*  MG  --   --  1.9   Liver Function Tests: Recent Labs  Lab 09/25/19 1022 09/30/19 0604  AST 24 22  ALT 24 24  ALKPHOS 84 76  BILITOT 0.5 0.5  PROT 6.8 6.6  ALBUMIN 3.1* 3.3*   No results for input(s): LIPASE, AMYLASE in the last 168 hours. No results for input(s): AMMONIA in the last 168 hours. CBC: Recent Labs  Lab 09/25/19 1022 09/29/19 2227 09/30/19 0604  WBC 9.2 13.0* 10.7*  NEUTROABS 5.6  --   --   HGB 12.9* 12.6* 11.6*  HCT 40.0 38.7* 36.2*  MCV 89.1 88.8 89.2  PLT 296 266 262   Cardiac Enzymes: No results for input(s):  CKTOTAL, CKMB, CKMBINDEX, TROPONINI in the last 168 hours. BNP: BNP (last 3 results) Recent Labs    07/08/19 0814 09/25/19 1356 09/29/19 2309  BNP 534.0* 140.3* 222.2*    ProBNP (last 3 results) No results for input(s): PROBNP in the last 8760 hours.  CBG: Recent Labs  Lab 09/30/19 1112  GLUCAP 174*       Signed:  Briant CedarNkeiruka J Charne Mcbrien, MD [facility] 09/30/2019, 4:03 PM

## 2019-09-30 NOTE — Discharge Instructions (Signed)
YOUR CARDIOLOGY TEAM HAS ARRANGED FOR AN E-VISIT FOR YOUR APPOINTMENT - PLEASE REVIEW IMPORTANT INFORMATION BELOW SEVERAL DAYS PRIOR TO YOUR APPOINTMENT ° °Due to the recent COVID-19 pandemic, we are transitioning in-person office visits to tele-medicine visits in an effort to decrease unnecessary exposure to our patients, their families, and staff. These visits are billed to your insurance just like a normal visit is. We also encourage you to sign up for MyChart if you have not already done so. You will need a smartphone if possible. For patients that do not have this, we can still complete the visit using a regular telephone but do prefer a smartphone to enable video when possible. You may have a family member that lives with you that can help. If possible, we also ask that you have a blood pressure cuff and scale at home to measure your blood pressure, heart rate and weight prior to your scheduled appointment. Patients with clinical needs that need an in-person evaluation and testing will still be able to come to the office if absolutely necessary. If you have any questions, feel free to call our office. ° °• IF USING MYCHART - How to Download the MyChart App to Your SmartPhone  ° °- If Apple, go to App Store and type in MyChart in the search bar and download the app. If Android, ask patient to go to Google Play Store and type in MyChart in the search bar and download the app. The app is free but as with any other app downloads, your phone may require you to verify saved payment information or Apple/Android password.  °- You will need to then log into the app with your MyChart username and password, and select [facility] as your healthcare provider to link the account.  °- When it is time for your visit, go to the MyChart app, find appointments, and click Begin Video Visit. Be sure to Select Allow for your device to access the Microphone and Camera for your visit. You will then be connected, and your provider  will be with you shortly. ° **If you have any issues connecting or need assistance, please contact MyChart service desk [phone] ([phone])** ° **If using a computer, in order to ensure the best quality for your visit, you will need to use either of the following Internet Browsers: Google Chrome or Microsoft Edge** ° °• IF USING DOXIMITY or [email or web address].ME - The staff will give you instructions on receiving your link to join the meeting the day of your visit.  ° ° ° ° °THE DAY OF YOUR APPOINTMENT ° °Approximately 15 minutes prior to your scheduled appointment, you will receive a telephone call from one of [facility] team - your caller ID may say "Unknown caller."  Our staff will confirm medications, vital signs for the day and any symptoms you may be experiencing. Please have this information available prior to the time of visit start. It may also be helpful for you to have a pad of paper and pen handy for any instructions given during your visit. They will also walk you through joining the smartphone meeting if this is a video visit. ° ° ° °CONSENT FOR TELE-HEALTH VISIT - PLEASE REVIEW ° °I hereby voluntarily request, consent and authorize [facility] and its employed or contracted physicians, physician assistants, nurse practitioners or other licensed health care professionals (the Practitioner), to provide me with telemedicine health care services (the “Services") as deemed necessary by the treating Practitioner. I acknowledge and consent to receive the   Services by the Practitioner via telemedicine. I understand that the telemedicine visit will involve communicating with the Practitioner through live audiovisual communication technology and the disclosure of certain medical information by electronic transmission. I acknowledge that I have been given the opportunity to request an in-person assessment or other available alternative prior to the telemedicine visit and am voluntarily participating in the  telemedicine visit. ° °I understand that I have the right to withhold or withdraw my consent to the use of telemedicine in the course of my care at any time, without affecting my right to future care or treatment, and that the Practitioner or I may terminate the telemedicine visit at any time. I understand that I have the right to inspect all information obtained and/or recorded in the course of the telemedicine visit and may receive copies of available information for a reasonable fee.  I understand that some of the potential risks of receiving the Services via telemedicine include:  °• Delay or interruption in medical evaluation due to technological equipment failure or disruption; °• Information transmitted may not be sufficient (e.g. poor resolution of images) to allow for appropriate medical decision making by the Practitioner; and/or  °• In rare instances, security protocols could fail, causing a breach of personal health information. ° °Furthermore, I acknowledge that it is my responsibility to provide information about my medical history, conditions and care that is complete and accurate to the best of my ability. I acknowledge that Practitioner's advice, recommendations, and/or decision may be based on factors not within their control, such as incomplete or inaccurate data provided by me or distortions of diagnostic images or specimens that may result from electronic transmissions. I understand that the practice of medicine is not an exact science and that Practitioner makes no warranties or guarantees regarding treatment outcomes. I acknowledge that I will receive a copy of this consent concurrently upon execution via email to the email address I last provided but may also request a printed copy by calling the office of [facility].   ° °I understand that my insurance will be billed for this visit.  ° °I have read or had this consent read to me. °• I understand the contents of this consent, which  adequately explains the benefits and risks of the Services being provided via telemedicine.  °• I have been provided ample opportunity to ask questions regarding this consent and the Services and have had my questions answered to my satisfaction. °• I give my informed consent for the services to be provided through the use of telemedicine in my medical care ° °By participating in this telemedicine visit I agree to the above. °

## 2019-09-30 NOTE — Consult Note (Addendum)
Cardiology Consultation:   Patient ID: Scherrie BatemanLeonidas Claude Ding MRN: [id number]; DOB: 11/03/1970  Admit date: 09/29/2019 Date of Consult: 09/30/2019  Primary Care Provider: Renford DillsPolite, Ronald, MD Primary Cardiologist: Lance MussJayadeep Varanasi, MD  Patient Profile:   Scherrie BatemanLeonidas Claude Runk is a 48 y.o. male with a hx of paroxysmal atrial fibrillation on flecainide and Xarelto, PSVT, hypertension, obstructive sleep apnea on CPAP, prior alcohol/cocaine abuse, chronic diastolic heart failure, morbid obesity and ongoing tobacco smoking who is being seen today for the evaluation of tachycardia at the request of Dr. Toniann FailKakrakandy.  He was evaluated by Dr. Delane GingerPhil Nahser in 4/14 in the hospital during an admission with PAF with RVR.Ritalin was adrug tobe avoided in the futuresince the AFib started with medicine. He converted to NSR and long-term anticoagulation was recommended given his stroke risk. Placed on Xarelto. Flecainide was added to maintain NSR. He was to follow up in office for ETT to rule out pro-arrhythmia, but was not seen here until 2/16. Review of his chart indicates he [facility]. Hewas placed back on Flecainide and FU ETT-Myoview was low risk and neg for pro-arrhythmia. he has since been followed by Dr. Eldridge DaceVaranasi.  Admitted 06/2019 for SVT.  Converted to sinus rhythm on IV adenosine and IV Cardizem.  He was drinking lots of soda and caffeine.  Echo with preserved LV function.  Felt his episode in setting of dehydration and increased caffeine intake.  He was doing well on cardiac standpoint when last seen by me July 21, 2019.  He was seen in ER September 25, 2019 for generalized weakness and short throat.  Covid was negative.  Incidentally he was noted tachycardic in the 140s with rhythm concerning for sinus tachycardia versus A. fib.  He was given p.o. metoprolol with improved heart rate.  He was discharged home with close outpatient cardiology follow-up.  History of  Present Illness:   Mr. Katrinka BlazingSmith was doing well since ER visit last week up until yesterday morning when he started to feeling weak and shortness of breath.  He did not felt any palpitation.  He thought he is out of rhythm and tried vagal maneuver an extra dose of metoprolol and flecainide without any improvement.  Reports compliant with his CPAP.  He could not get comfortable at night and came to ER for further evaluation.  He was noted tachycardic at rate of 160s with rhythm of SVT.  He spontaneously cardioverted in the ER and maintaining sinus rhythm since then.  He denies alcohol abuse or illicit drug use.  He smokes cigarettes regularly.  Denies caffeine intake but was drinking coffee during my evaluation.  Symptoms similar when he came to ER last week.  Checks x-ray showed pulmonary infiltrate.  Concerning for pneumonia and given antibiotics by primary team.  Covid negative.  BNP 222.  High-sensitivity troponin 15>>14>>22.  CRP 1.5.  Procalcitonin normal. BUN/SCr 30/146 on arrival >> improved to 26/1.14.  Denies orthopnea, PND, syncope, lower extremity edema, dizziness or melena.  Heart Pathway Score:     Past Medical History:  Diagnosis Date  . Diabetes mellitus   . GERD (gastroesophageal reflux disease)   . History of alcohol abuse   . History of cocaine use   . History of echocardiogram    a. Echo 4/14: Moderate LVH, vigorous LVEF, EF 65-70%, normal wall motion, grade 2 diastolic dysfunction, mildly dilated aortic root and ascending aorta, ascending aorta 40 mm, aortic root 38 mm, mild LAE  . Hx of cardiovascular stress  test    a. GXT 5/14: No ischemic changes  //  b. ETT-Myoview 3/16:  Low risk, no ischemia, EF 58%  . Hyperlipidemia   . Hypertension   . Morbid obesity (HCC)   . Obesity   . Paroxysmal atrial fibrillation (HCC)    Occurring in 2008, with several recurrence since then (including in the setting of + cocaine on UDS).  . Sleep apnea     Past Surgical History:  Procedure  Laterality Date  . APPENDECTOMY    . ROTATOR CUFF REPAIR    . TONSILLECTOMY       Inpatient Medications: Scheduled Meds: . atorvastatin  10 mg Oral Daily  . flecainide  100 mg Oral BID  . insulin aspart  0-9 Units Subcutaneous TID WC  . lisinopril  20 mg Oral Daily  . metoprolol succinate  25 mg Oral Daily  . rivaroxaban  20 mg Oral Q supper   Continuous Infusions: . cefTRIAXone (ROCEPHIN)  IV    . doxycycline (VIBRAMYCIN) IV Stopped (09/30/19 0543)   PRN Meds: acetaminophen **OR** acetaminophen, ondansetron **OR** ondansetron (ZOFRAN) IV  Allergies:    Allergies  Allergen Reactions  . Shrimp [Shellfish Allergy] Anaphylaxis  . Banana Other (See Comments)    Pt gags  . Watermelon Flavor Nausea And Vomiting  . Peanut-Containing Drug Products Itching and Cough  . Almond Oil Itching    Roof of mouth itches  . Sulfa Antibiotics Itching    Social History:   Social History   Socioeconomic History  . Marital status: Single    Spouse name: Not on file  . Number of children: 0  . Years of education: 67  . Highest education level: Not on file  Occupational History  . Occupation: Designer, industrial/product: UNEMPLOYED  Tobacco Use  . Smoking status: Current Every Day Smoker    Packs/day: 1.00    Years: 28.00    Pack years: 28.00  . Smokeless tobacco: Former Neurosurgeon    Types: Snuff  . Tobacco comment: smokes one pack per day 05/12/2019  Substance and Sexual Activity  . Alcohol use: Yes    Comment: occasionally - 1 beer 1x/mont  . Drug use: No    Comment: cocaine in the past, none currently  . Sexual activity: Not on file  Other Topics Concern  . Not on file  Social History Narrative   Fun: Photographer    Social Determinants of Health   Financial Resource Strain:   . Difficulty of Paying Living Expenses: Not on file  Food Insecurity:   . Worried About Programme researcher, broadcasting/film/video in the Last Year: Not on file  . Ran Out of Food in the Last Year: Not on file  Transportation  Needs:   . Lack of Transportation (Medical): Not on file  . Lack of Transportation (Non-Medical): Not on file  Physical Activity:   . Days of Exercise per Week: Not on file  . Minutes of Exercise per Session: Not on file  Stress:   . Feeling of Stress : Not on file  Social Connections:   . Frequency of Communication with Friends and Family: Not on file  . Frequency of Social Gatherings with Friends and Family: Not on file  . Attends Religious Services: Not on file  . Active Member of Clubs or Organizations: Not on file  . Attends Banker Meetings: Not on file  . Marital Status: Not on file  Intimate Partner Violence:   . Fear  of Current or Ex-Partner: Not on file  . Emotionally Abused: Not on file  . Physically Abused: Not on file  . Sexually Abused: Not on file    Family History:   Family History  Problem Relation Age of Onset  . Diabetes Mother   . Heart attack Father 16  . Stroke Maternal Grandmother   . Stroke Paternal Grandmother   . Diabetes Paternal Grandfather      ROS:  Please see the history of present illness.  All other ROS reviewed and negative.     Physical Exam/Data:   Vitals:   09/30/19 0530 09/30/19 0600 09/30/19 0730 09/30/19 0900  BP: (!) 187/100 (!) 153/84 (!) 153/84 (!) 148/82  Pulse: 74 74 74 77  Resp: Temp:      TempSrc:      SpO2: 99% 99% 99% 98%  Weight:      Height:        Intake/Output Summary (Last 24 hours) at 09/30/2019 0955 Last data filed at 09/30/2019 0543 Gross per 24 hour  Intake 344.19 ml  Output --  Net 344.19 ml   Last 3 Weights 09/29/2019 07/21/2019 07/09/2019  Weight (lbs) 304 lb 309 lb 290 lb  Weight (kg) 137.893 kg 140.161 kg 131.543 kg     Body mass index is 52.18 kg/m.  General: Obese male in no acute distress HEENT: normal Lymph: no adenopathy Neck: no JVD Endocrine:  No thryomegaly Vascular: No carotid bruits; FA pulses 2+ bilaterally without bruits  Cardiac:  normal S1, S2; RRR;  no murmur  Lungs:  clear to auscultation bilaterally, no wheezing, rhonchi or rales  Abd: soft, nontender, no hepatomegaly  Ext: no edema Musculoskeletal:  No deformities, BUE and BLE strength normal and equal Skin: warm and dry  Neuro:  CNs 2-12 intact, no focal abnormalities noted Psych:  Normal affect   EKG:  The EKG was personally reviewed and demonstrates:  SVT (read as sinus tachycardia by computer) at rate of 160 bpm  Telemetry:  Telemetry was personally reviewed and demonstrates:  Sinus rhythm at rate of 70s  Relevant CV Studies:  Echo 06/2019 1. Left ventricular ejection fraction, by visual estimation, is 60 to 65%. The left ventricle has normal function. Normal left ventricular size. There is no left ventricular hypertrophy.  2. Left ventricular diastolic Doppler parameters are consistent with impaired relaxation pattern of LV diastolic filling.  3. Global right ventricle has normal systolic function.The right ventricular size is normal. No increase in right ventricular wall thickness.  4. Left atrial size was normal.  5. Right atrial size was normal.  6. The mitral valve is normal in structure. No evidence of mitral valve regurgitation. No evidence of mitral stenosis.  7. The tricuspid valve is normal in structure. Tricuspid valve regurgitation was not visualized by color flow Doppler.  8. The aortic valve is normal in structure. Aortic valve regurgitation was not visualized by color flow Doppler. Structurally normal aortic valve, with no evidence of sclerosis or stenosis.  9. The pulmonic valve was normal in structure. Pulmonic valve regurgitation is not visualized by color flow Doppler. 10. The inferior vena cava is normal in size with greater than 50% respiratory variability, suggesting right atrial pressure of 3 mmHg.  ETT 01/2018  Patient walked for 5 minutes and 30 seconds. He achieved a peak heart rate of 151 which is 87% predicted maximal heart rate. Blood pressure  demonstrated a hypertensive response to exercise.  There were no ST  or T wave changes to suggest ischemia. There was a premature ventricular contraction during the recovery phase.  This is interpreted as a negative treadmill test.   Laboratory Data:  High Sensitivity Troponin:   Recent Labs  Lab 09/25/19 1353 09/25/19 1606 09/29/19 2227 09/30/19 0017 09/30/19 0604  TROPONINIHS 15 11 15 14  21*     Chemistry Recent Labs  Lab 09/25/19 1022 09/29/19 2227 09/30/19 0604  NA 139 140 140  K 5.0 4.8 4.6  CL 109 106 109  CO2 23 24 24   GLUCOSE 168* 213* 142*  BUN 27* 30* 26*  CREATININE 1.63* 1.46* 1.14  CALCIUM 8.4* 8.4* 8.1*  GFRNONAA 49* 56* >60  GFRAA 57* >60 >60  ANIONGAP 7 10 7     Recent Labs  Lab 09/25/19 1022 09/30/19 0604  PROT 6.8 6.6  ALBUMIN 3.1* 3.3*  AST 24 22  ALT 24 24  ALKPHOS 84 76  BILITOT 0.5 0.5   Hematology Recent Labs  Lab 09/25/19 1022 09/29/19 2227 09/30/19 0604  WBC 9.2 13.0* 10.7*  RBC 4.49 4.36 4.06*  HGB 12.9* 12.6* 11.6*  HCT 40.0 38.7* 36.2*  MCV 89.1 88.8 89.2  MCH 28.7 28.9 28.6  MCHC 32.3 32.6 32.0  RDW 13.1 13.2 13.2  PLT 296 266 262   BNP Recent Labs  Lab 09/25/19 1356 09/29/19 2309  BNP 140.3* 222.2*    DDimer  Recent Labs  Lab 09/30/19 0604  DDIMER 0.55*     Radiology/Studies:  DG Chest 2 View  Result Date: 09/29/2019 CLINICAL DATA:  Tachycardia, shortness of breath EXAM: CHEST - 2 VIEW COMPARISON:  Radiograph 09/25/2019 FINDINGS: Multifocal interstitial and airspace opacities throughout both lungs, perihilar haze and in the stent miss of pulmonary vascularity. Cardiomegaly is similar to comparison radiographs. No pneumothorax. No visible effusion. No acute osseous or soft tissue abnormality. IMPRESSION: 1. Multifocal interstitial and airspace opacities throughout both lungs, consistent with pulmonary edema though infection could have a similar appearance and should be excluded clinically. 2. Stable  cardiomegaly. Electronically Signed   By: Lovena Le [name]   On: 09/29/2019 22:19    Assessment and Plan:   1. Paroxysmal supraventricular tachycardia Initially diagnosed with SVT September 2020 in setting of dehydration and excess caffeine intake.  He was in ER last week with rhythm seems like SVT as well.  He feels shortness of breath, fatigue and weak when goes out of rhythm.  He did not felt any palpitation.  He spontaneously converted overnight.  He took extra metoprolol and flecainide yesterday morning and tried vagal maneuver prior to presentation.  His BUN/creatinine was minimally elevated on arrival which improved this morning.  He denies any caffeine intake however noted coffee on a table.  Highly suspicious he is noncompliant with dietary restriction.  Echocardiogram few months ago showed preserved LV function.  Likely outpatient EP evaluation. -Consider increasing beta-blocker.  2.  Paroxysmal atrial fibrillation -Continue flecainide, metoprolol succinate and Xarelto.  No bleeding issue.  3.  Hypertension -Blood pressure stable  4.  AKI -Resolved  5.  Chronic diastolic heart failure -Appears euvolemic  6.  Diabetes mellitus -Per primary team   For questions or updates, please contact [place] Please consult [email or web address] for contact info under     Jarrett Soho, PA  09/30/2019 9:55 AM   Personally seen and examined. Agree with above.   48 year old male with multiple ER visits with tacky arrhythmia on flecainide Xarelto here with recent shortness of breath.  Covid negative.  Converted  from SVT back to sinus rhythm.  U tox negative.  GEN: Well nourished, well developed, in no acute distress  HEENT: normal  Neck: no JVD, carotid bruits, or masses Cardiac: RRR; no murmurs, rubs, or gallops,no edema  Respiratory:  clear to auscultation bilaterally, normal work of breathing GI: soft, nontender, nondistended, + BS MS: no deformity or atrophy    Skin: warm and dry, no rash Neuro:  Alert and Oriented x 3, Strength and sensation are intact Psych: euthymic mood, full affect  Lab work reviewed.  Prior EKG in December 10 showed heart rate in the 140s which look like transient atrial tachycardia.  Current EKG shows heart rate of 160 regular, SVT  Telemetry also personally reviewed.  Abrupt termination to sinus rhythm  Assessment and plan  SVT -Continue with flecainide.  We will increase his metoprolol. -We will have him follow-up with Dr. Johney Frame who has seen him several years ago for potential EP study ablative procedure. -Several ED visits with similar findings.  Okay for discharge from cardiology perspective.  Donato Schultz, MD

## 2019-09-30 NOTE — H&P (Signed)
History and Physical    [name] ZOX:[id number] DOB: 08/22/1971 DOA: 09/29/2019  PCP: Renford Dills, MD  Patient coming from: Home.  Chief Complaint: Shortness of breath and palpitations.  HPI: Curt Oatis is a 48 y.o. male with history of atrial fibrillation, SVT, diastolic heart failure, morbid obesity, diabetes mellitus, previous history of drug abuse who was recently admitted in September 2020 for SVT at the time patient also was placed on anticoagulation and flecainide takes beta-blocker started having shortness of breath since yesterday morning with palpitation.  Denies any chest pain has been having some nonproductive cough.  Denies fever chills nausea vomiting diarrhea.  Had a similar episode few days ago and had come to the ER and at the time and patient was given metoprolol symptoms resolved.  Patient states his symptoms presently was similar and he came to the ER.  ED Course: In the ER initial EKG shows likely SVT heart rate of 160 bpm which resolved without any intervention.  Patient states he did take extra dose of metoprolol before coming to the ER.  He still feels mildly short of breath.  Chest x-ray shows multifocal infiltrates concerning for pulmonary edema patient is not hypoxic otherwise.  Labs show creatinine about 1.4 BNP 222 high sensitive troponin was 15 and 14 WBC count was 13 hemoglobin 12.6.  COVID-19 test is pending.  Patient was started on empiric antibiotics for possible pneumonia given the shortness of breath WBC count and infiltrate in the chest x-ray.  Review of Systems: As per HPI, rest all negative.   Past Medical History:  Diagnosis Date   Diabetes mellitus    GERD (gastroesophageal reflux disease)    History of alcohol abuse    History of cocaine use    History of echocardiogram    a. Echo 4/14: Moderate LVH, vigorous LVEF, EF 65-70%, normal wall motion, grade 2 diastolic dysfunction, mildly dilated aortic root and ascending  aorta, ascending aorta 40 mm, aortic root 38 mm, mild LAE   Hx of cardiovascular stress test    a. GXT 5/14: No ischemic changes  //  b. ETT-Myoview 3/16:  Low risk, no ischemia, EF 58%   Hyperlipidemia    Hypertension    Morbid obesity (HCC)    Obesity    Paroxysmal atrial fibrillation (HCC)    Occurring in 2008, with several recurrence since then (including in the setting of + cocaine on UDS).   Sleep apnea     Past Surgical History:  Procedure Laterality Date   APPENDECTOMY     ROTATOR CUFF REPAIR     TONSILLECTOMY       reports that he has been smoking. He has a 28.00 pack-year smoking history. He has quit using smokeless tobacco.  His smokeless tobacco use included snuff. He reports current alcohol use. He reports that he does not use drugs.  Allergies  Allergen Reactions   Shrimp [Shellfish Allergy] Anaphylaxis   Banana Other (See Comments)    Pt gags   Watermelon Flavor Nausea And Vomiting   Peanut-Containing Drug Products Itching and Cough   Almond Oil Itching    Roof of mouth itches   Sulfa Antibiotics Itching    Family History  Problem Relation Age of Onset   Diabetes Mother    Heart attack Father 43   Stroke Maternal Grandmother    Stroke Paternal Grandmother    Diabetes Paternal Grandfather     Prior to Admission medications   Medication Sig Start Date End  Date Taking? Authorizing Provider  atorvastatin (LIPITOR) 10 MG tablet Take 10 mg by mouth daily. 09/16/19  Yes [provider]  Blood Pressure Monitoring (BLOOD PRESSURE MONITOR/L CUFF) MISC 1 Units by Does not apply route daily. 03/19/19  Yes Jeannie FendMurphy, Laura A, PA-C  flecainide (TAMBOCOR) 100 MG tablet TAKE 1 TABLET BY MOUTH TWICE A DAY Patient taking differently: Take 100 mg by mouth 2 (two) times daily.  02/25/19  Yes Corky CraftsVaranasi, Jayadeep S, MD  glipiZIDE (GLUCOTROL) 10 MG tablet Take 10 mg by mouth 2 (two) times daily. 01/06/19  Yes [provider]  ibuprofen (ADVIL)  200 MG tablet Take 400 mg by mouth every 6 (six) hours as needed for headache, mild pain, moderate pain or cramping.   Yes [provider]  lisinopril (PRINIVIL,ZESTRIL) 20 MG tablet Take 1 tablet (20 mg total) by mouth daily for 30 days. 01/24/19 09/29/19 Yes Glade LloydAlekh, Kshitiz, MD  metFORMIN (GLUCOPHAGE) 500 MG tablet Take 1,000 mg by mouth 2 (two) times daily. 01/06/19  Yes [provider]  metoprolol succinate (TOPROL-XL) 25 MG 24 hr tablet TAKE 1 TABLET BY MOUTH EVERY DAY Patient taking differently: Take 25 mg by mouth daily.  02/25/19  Yes Varanasi, Donnie CoffinJayadeep S, MD  XARELTO 20 MG TABS tablet TAKE 1 TABLET (20 MG TOTAL) BY MOUTH DAILY WITH SUPPER. Patient taking differently: Take 20 mg by mouth daily with supper.  08/21/19  Yes Corky CraftsVaranasi, Jayadeep S, MD    Physical Exam: Constitutional: Moderately built and nourished. Vitals:   09/29/19 2152 09/29/19 2300 09/30/19 0130 09/30/19 0200  BP: 133/87 129/75 (!) 154/98 (!) 156/90  Pulse: (!) 160 77 73 72  Resp: 20 18  16   Temp: 97.9 F (36.6 C)     TempSrc: Oral     SpO2: 95% 98% 96% 96%  Weight: (!) 137.9 kg     Height: 5\' 4"  (1.626 m)      Eyes: Anicteric no pallor. ENMT: No discharge from the ears or nose or mouth. Neck: No masses.  No neck rigidity. Respiratory: No rhonchi or crepitations. Cardiovascular: S1-S2 heard. Abdomen: Soft nontender bowel sounds present. Musculoskeletal: No edema.  No joint effusion. Skin: No rash. Neurologic: Alert awake oriented to time place and person.  Moves all extremities. Psychiatric: Appears normal per normal affect.   Labs on Admission: I have personally reviewed following labs and imaging studies  CBC: Recent Labs  Lab 09/25/19 1022 09/29/19 2227  WBC 9.2 13.0*  NEUTROABS 5.6  --   HGB 12.9* 12.6*  HCT 40.0 38.7*  MCV 89.1 88.8  PLT 296 266   Basic Metabolic Panel: Recent Labs  Lab 09/25/19 1022 09/29/19 2227  NA 139 140  K 5.0 4.8  CL 109 106  CO2 23 24  GLUCOSE  168* 213*  BUN 27* 30*  CREATININE 1.63* 1.46*  CALCIUM 8.4* 8.4*   GFR: Estimated Creatinine Clearance: 79.4 mL/min (A) (by C-G formula based on SCr of 1.46 mg/dL (H)). Liver Function Tests: Recent Labs  Lab 09/25/19 1022  AST 24  ALT 24  ALKPHOS 84  BILITOT 0.5  PROT 6.8  ALBUMIN 3.1*   No results for input(s): LIPASE, AMYLASE in the last 168 hours. No results for input(s): AMMONIA in the last 168 hours. Coagulation Profile: No results for input(s): INR, PROTIME in the last 168 hours. Cardiac Enzymes: No results for input(s): CKTOTAL, CKMB, CKMBINDEX, TROPONINI in the last 168 hours. BNP (last 3 results) No results for input(s): PROBNP in the last 8760 hours.  HbA1C: No results for input(s): HGBA1C in the last 72 hours. CBG: No results for input(s): GLUCAP in the last 168 hours. Lipid Profile: No results for input(s): CHOL, HDL, LDLCALC, TRIG, CHOLHDL, LDLDIRECT in the last 72 hours. Thyroid Function Tests: No results for input(s): TSH, T4TOTAL, FREET4, T3FREE, THYROIDAB in the last 72 hours. Anemia Panel: No results for input(s): VITAMINB12, FOLATE, FERRITIN, TIBC, IRON, RETICCTPCT in the last 72 hours. Urine analysis:    Component Value Date/Time   COLORURINE YELLOW 09/25/2019 1332   APPEARANCEUR CLOUDY (A) 09/25/2019 1332   LABSPEC 1.023 09/25/2019 1332   PHURINE 5.0 09/25/2019 1332   GLUCOSEU NEGATIVE 09/25/2019 1332   HGBUR MODERATE (A) 09/25/2019 1332   BILIRUBINUR NEGATIVE 09/25/2019 1332   KETONESUR NEGATIVE 09/25/2019 1332   PROTEINUR 100 (A) 09/25/2019 1332   UROBILINOGEN 1.0 11/19/2013 1855   NITRITE NEGATIVE 09/25/2019 1332   LEUKOCYTESUR NEGATIVE 09/25/2019 1332   Sepsis Labs: @LABRCNTIP (procalcitonin:4,lacticidven:4) ) Recent Results (from the past 240 hour(s))  Novel Coronavirus, NAA (Hosp order, Send-out to [facility]; TAT 18-24 hrs     Status: None   Collection Time: 09/25/19  1:34 PM   Specimen: Nasopharyngeal Swab; Respiratory  Result Value  Ref Range Status   SARS-CoV-2, NAA NOT DETECTED NOT DETECTED Final    Comment: (NOTE) This nucleic acid amplification test was developed and its performance characteristics determined by [employer]. Nucleic acid amplification tests include PCR and TMA. This test has not been FDA cleared or approved. This test has been authorized by FDA under an Emergency Use Authorization (EUA). This test is only authorized for the duration of time the declaration that circumstances exist justifying the authorization of the emergency use of in vitro diagnostic tests for detection of SARS-CoV-2 virus and/or diagnosis of COVID-19 infection under section 564(b)(1) of the Act, 21 U.S.C. 034VQQ-5(Z) (1), unless the authorization is terminated or revoked sooner. When diagnostic testing is negative, the possibility of a false negative result should be considered in the context of a patient's recent exposures and the presence of clinical signs and symptoms consistent with COVID-19. An individual without symptoms of COVID- 19 and who is not shedding SARS-CoV-2 vi rus would expect to have a negative (not detected) result in this assay. Performed At: [facility] [address] [place], [name] [id number] Rush Farmer MD PI:[id number]    [place]  Final    Comment: Performed at [facility], [place] [address], [place], [place] [address]     Radiological Exams on Admission: DG Chest 2 View  Result Date: 09/29/2019 CLINICAL DATA:  Tachycardia, shortness of breath EXAM: CHEST - 2 VIEW COMPARISON:  Radiograph 09/25/2019 FINDINGS: Multifocal interstitial and airspace opacities throughout both lungs, perihilar haze and in the stent miss of pulmonary vascularity. Cardiomegaly is similar to comparison radiographs. No pneumothorax. No visible effusion. No acute osseous or soft tissue abnormality. IMPRESSION: 1. Multifocal interstitial and airspace opacities throughout both  lungs, consistent with pulmonary edema though infection could have a similar appearance and should be excluded clinically. 2. Stable cardiomegaly. Electronically Signed   By: Lovena Le [name]   On: 09/29/2019 22:19    EKG: Independently reviewed.  SVT.  Assessment/Plan Principal Problem:   Acute respiratory failure with hypoxia (HCC) Active Problems:   Community acquired pneumonia   Chronic diastolic heart failure (HCC)   PAF (paroxysmal atrial fibrillation) (HCC)   Diabetes mellitus (HCC)   Palpitations    1. Acute respiratory failure with hypoxia with palpitation likely precipitated by  SVT.  Since chest x-ray shows multifocal infiltrates and with leukocytosis patient empirically treated for community-acquired pneumonia at this time.  Awaiting COVID-19 test pending.  We will also check D-dimer however patient states he did not miss his Xarelto.  Will get cardiology involved since patient has been a recurrent episodes of palpitations.  Follow TSH.  May discontinue antibiotics if procalcitonin negative. 2. SVT and history of A. fib on flecainide beta-blockers and Xarelto.  See #1. 3. Chronic diastolic CHF no obvious signs of fluid overload.  We will continue to monitor.  Last EF measured in September 2020 was 60 to 65%. 4. Diabetes mellitus type 2 we will keep patient on sliding scale coverage. 5. Chronic kidney disease stage III creatinine appears to be at baseline. 6. Anemia follow CBC. 7. History of drug abuse follow urine drug screen.  COVID-19 test and D-dimer is pending.     DVT prophylaxis: Xarelto. Code Status: Full code. Family Communication: Discussed with patient. Disposition Plan: Home. Consults called: Cardiology. Admission status: Observation.   Eduard Clos MD [facility] Pager [phone].  If 7PM-7AM, please contact night-coverage [email or web address] Password [email or web address]  09/30/2019, 2:23 AM

## 2019-10-01 ENCOUNTER — Telehealth: Payer: Self-pay | Admitting: *Deleted

## 2019-10-01 ENCOUNTER — Telehealth (INDEPENDENT_AMBULATORY_CARE_PROVIDER_SITE_OTHER): Payer: 59 | Admitting: Internal Medicine

## 2019-10-01 ENCOUNTER — Other Ambulatory Visit: Payer: Self-pay

## 2019-10-01 ENCOUNTER — Encounter: Payer: Self-pay | Admitting: Internal Medicine

## 2019-10-01 VITALS — Ht 69.0 in | Wt 304.0 lb

## 2019-10-01 DIAGNOSIS — I480 Paroxysmal atrial fibrillation: Secondary | ICD-10-CM | POA: Diagnosis not present

## 2019-10-01 DIAGNOSIS — I471 Supraventricular tachycardia: Secondary | ICD-10-CM

## 2019-10-01 DIAGNOSIS — Z9989 Dependence on other enabling machines and devices: Secondary | ICD-10-CM

## 2019-10-01 DIAGNOSIS — D6869 Other thrombophilia: Secondary | ICD-10-CM

## 2019-10-01 DIAGNOSIS — Z79899 Other long term (current) drug therapy: Secondary | ICD-10-CM

## 2019-10-01 DIAGNOSIS — G4733 Obstructive sleep apnea (adult) (pediatric): Secondary | ICD-10-CM | POA: Diagnosis not present

## 2019-10-01 DIAGNOSIS — Z7901 Long term (current) use of anticoagulants: Secondary | ICD-10-CM

## 2019-10-01 MED ORDER — FLECAINIDE ACETATE 100 MG PO TABS: 150.0000 mg | ORAL_TABLET | Freq: Two times a day (BID) | ORAL | 3 refills | Status: DC

## 2019-10-01 NOTE — Telephone Encounter (Signed)
Called and spoke with pt, he is aware of appt 10/22/2019 @10 :30 with Adline Peals, PA and given directions to [facility].

## 2019-10-01 NOTE — Telephone Encounter (Signed)
Flecainide increased to 150 mg BID, Rx sent to pharmacy. Forwarding to [facility] to arrange follow up in their clinic.

## 2019-10-01 NOTE — Telephone Encounter (Signed)
-----   Message from Thompson Grayer, MD sent at 10/01/2019 11:20 AM EST ----- Increase flecainide to 150mg  BID  Follow-up in AF clinic in 3 weeks

## 2019-10-01 NOTE — Progress Notes (Signed)
Electrophysiology TeleHealth Note   Due to national recommendations of social distancing due to COVID 19, Audio telehealth visit is felt to be most appropriate for this patient at this time.  See MyChart message from today for patient consent regarding telehealth for [facility].   Date:  10/01/2019   ID:  [name], DOB 08-27-1971, MRN [id number]  Location: home Provider location: [place] Evaluation Performed: New patient consult  PCP:  Renford DillsPolite, Ronald, MD  Cardiologist:  Lance MussJayadeep Varanasi, MD  Electrophysiologist:  None   Chief Complaint:  arrhythmia  History of Present Illness:    Douglas BatemanLeonidas Claude Bence is a 48 y.o. male who presents via audio/video conferencing for a telehealth visit today.   The patient is referred for new consultation regarding afib/ SVT by Dr Anne FuSkains.   He has a long history of atrial fibrillation (since at least 2008). He saw me previously and was placed on fleacinide.  He has morbid obesity, OSA (uses CPAP) and h/o polysubstance abuse.  He reports cessation of cocaine and uses his CPAP.  He reports compliance with medicine. [facility] with salvos of SVT.  These were mid RP episodes suggestive at times of atria tachycardia. He is chronically anticoagulation.   + palpitations and fatigue.  He is not very active and does not work.   Today, he denies symptoms of chest pain, shortness of breath, orthopnea, PND, lower extremity edema, claudication, dizziness, presyncope, syncope, bleeding, or neurologic sequela. The patient is tolerating medications without difficulties and is otherwise without complaint today.   he denies symptoms of cough, fevers, chills, or new SOB worrisome for COVID 19.   Past Medical History:  Diagnosis Date  . Diabetes mellitus   . GERD (gastroesophageal reflux disease)   . History of alcohol abuse   . History of cocaine use   . History of echocardiogram    a. Echo 4/14: Moderate LVH,  vigorous LVEF, EF 65-70%, normal wall motion, grade 2 diastolic dysfunction, mildly dilated aortic root and ascending aorta, ascending aorta 40 mm, aortic root 38 mm, mild LAE  . Hx of cardiovascular stress test    a. GXT 5/14: No ischemic changes  //  b. ETT-Myoview 3/16:  Low risk, no ischemia, EF 58%  . Hyperlipidemia   . Hypertension   . Morbid obesity (HCC)   . Paroxysmal atrial fibrillation (HCC)    Occurring in 2008, with several recurrence since then (including in the setting of + cocaine on UDS).  . Sleep apnea    uses CPAP  . SVT (supraventricular tachycardia) (HCC)    mid RP SVT 09/2019    Past Surgical History:  Procedure Laterality Date  . APPENDECTOMY    . ROTATOR CUFF REPAIR    . TONSILLECTOMY      Current Outpatient Medications  Medication Sig Dispense Refill  . atorvastatin (LIPITOR) 10 MG tablet Take 10 mg by mouth daily.    . Blood Pressure Monitoring (BLOOD PRESSURE MONITOR/L CUFF) MISC 1 Units by Does not apply route daily. 1 each 0  . flecainide (TAMBOCOR) 100 MG tablet TAKE 1 TABLET BY MOUTH TWICE A DAY (Patient taking differently: Take 100 mg by mouth 2 (two) times daily. ) 180 tablet 3  . glipiZIDE (GLUCOTROL) 10 MG tablet Take 10 mg by mouth 2 (two) times daily.    . metFORMIN (GLUCOPHAGE) 500 MG tablet Take 1,000 mg by mouth 2 (two) times daily.    . metoprolol succinate (TOPROL-XL) 25  MG 24 hr tablet Take 2 tablets (50 mg total) by mouth daily.    Carlena Hurl 20 MG TABS tablet TAKE 1 TABLET (20 MG TOTAL) BY MOUTH DAILY WITH SUPPER. (Patient taking differently: Take 20 mg by mouth daily with supper. ) 30 tablet 5  . lisinopril (PRINIVIL,ZESTRIL) 20 MG tablet Take 1 tablet (20 mg total) by mouth daily for 30 days. 30 tablet 0   No current facility-administered medications for this visit.    Allergies:   Shrimp [shellfish allergy], Banana, Watermelon flavor, Peanut-containing drug products, Almond oil, and Sulfa antibiotics   Social History:  The patient   reports that he has been smoking. He has a 28.00 pack-year smoking history. He has quit using smokeless tobacco.  His smokeless tobacco use included snuff. He reports current alcohol use. He reports that he does not use drugs.   Family History:  The patient's family history includes Diabetes in his mother and paternal grandfather; Heart attack (age of onset: 44) in his father; Stroke in his maternal grandmother and paternal grandmother.    ROS:  Please see the history of present illness.   All other systems are personally reviewed and negative.    Exam:    Vital Signs:  Ht 5\' 9"  (1.753 m)   Wt (!) 304 lb (137.9 kg)   BMI 44.89 kg/m    Well sounding, alert and conversant   Labs/Other Tests and Data Reviewed:    Recent Labs: 09/29/2019: B Natriuretic Peptide 222.2 09/30/2019: ALT 24; BUN 26; Creatinine, Ser 1.14; Hemoglobin 11.6; Magnesium 1.9; Platelets 262; Potassium 4.6; Sodium 140; TSH 2.069   Wt Readings from Last 3 Encounters:  10/01/19 (!) 304 lb (137.9 kg)  09/29/19 (!) 304 lb (137.9 kg)  07/21/19 (!) 309 lb (140.2 kg)     Other studies personally reviewed: Additional studies/ records that were reviewed today include: my prior notes,  Dr 09/20/19 recent notes,  Prior echo, recent ekgs  Review of the above records today demonstrates: as above  ASSESSMENT & PLAN:    1.  Paroxysmal atrial fibrillation/ SVT  The patient has symptomatic, recurrent paroxysmal atrial fibrillation.  He has recently been found to have mid RP SVT also.  Though I suspect primarily atach, I cannot exclude a reeentrant arrhythmia as the cause.  Chads2vasc score is 2.  he is anticoagulated with xarelto . Therapeutic strategies for his atrial arhythmias including medicine and ablation were discussed in detail with the patient today. Risk, benefits, and alternatives to EP study and radiofrequency ablation for afib were also discussed in detail today.  At this point, we will avoid ablation.  Increase  flecainide to 150mg  BID Lifestyle modificaiton is advised  2. Obesity Weight loss encouraged  3. OSA Compliance with CPAP advised  4. ETOH/ cocaine He reports that he is no longer using these  Follow-up:  AF clinic in 3 weeks to assess response to flecainide We should consider ablation if he fails elevated dose flecainide  Current medicines are reviewed at length with the patient today.   The patient does not have concerns regarding his medicines.  The following changes were made today:  none  Labs/ tests ordered today include:  No orders of the defined types were placed in this encounter.   Patient Risk:  after full review of this patients clinical status, I feel that they are at moderate risk at this time.   Today, I have spent 25 minutes with the patient with telehealth technology discussing arrhythmias .  SignedThompson Grayer MD, [place] 10/01/2019 11:22 AM   [facility] [address] [place] [address] [phone] (office) [phone] (fax)

## 2019-10-22 ENCOUNTER — Encounter (HOSPITAL_COMMUNITY): Payer: Self-pay | Admitting: Physician Assistant

## 2019-10-22 ENCOUNTER — Ambulatory Visit (HOSPITAL_COMMUNITY)
Admission: RE | Admit: 2019-10-22 | Discharge: 2019-10-22 | Disposition: A | Discharge status: Home / Self Care | Payer: 59 | Source: Ambulatory Visit | Attending: Physician Assistant | Admitting: Physician Assistant

## 2019-10-22 ENCOUNTER — Other Ambulatory Visit: Payer: Self-pay

## 2019-10-22 VITALS — BP 166/104 | HR 75 | Ht 69.0 in | Wt 302.6 lb

## 2019-10-22 DIAGNOSIS — Z6841 Body Mass Index (BMI) 40.0 and over, adult: Secondary | ICD-10-CM | POA: Insufficient documentation

## 2019-10-22 DIAGNOSIS — Z79899 Other long term (current) drug therapy: Secondary | ICD-10-CM | POA: Insufficient documentation

## 2019-10-22 DIAGNOSIS — E118 Type 2 diabetes mellitus with unspecified complications: Secondary | ICD-10-CM | POA: Insufficient documentation

## 2019-10-22 DIAGNOSIS — K219 Gastro-esophageal reflux disease without esophagitis: Secondary | ICD-10-CM | POA: Insufficient documentation

## 2019-10-22 DIAGNOSIS — E669 Obesity, unspecified: Secondary | ICD-10-CM | POA: Insufficient documentation

## 2019-10-22 DIAGNOSIS — I10 Essential (primary) hypertension: Secondary | ICD-10-CM | POA: Insufficient documentation

## 2019-10-22 DIAGNOSIS — D6869 Other thrombophilia: Secondary | ICD-10-CM

## 2019-10-22 DIAGNOSIS — Z7984 Long term (current) use of oral hypoglycemic drugs: Secondary | ICD-10-CM | POA: Diagnosis not present

## 2019-10-22 DIAGNOSIS — F17210 Nicotine dependence, cigarettes, uncomplicated: Secondary | ICD-10-CM | POA: Insufficient documentation

## 2019-10-22 DIAGNOSIS — I480 Paroxysmal atrial fibrillation: Secondary | ICD-10-CM | POA: Diagnosis not present

## 2019-10-22 DIAGNOSIS — Z7901 Long term (current) use of anticoagulants: Secondary | ICD-10-CM | POA: Insufficient documentation

## 2019-10-22 DIAGNOSIS — G4733 Obstructive sleep apnea (adult) (pediatric): Secondary | ICD-10-CM | POA: Insufficient documentation

## 2019-10-22 DIAGNOSIS — E785 Hyperlipidemia, unspecified: Secondary | ICD-10-CM | POA: Diagnosis not present

## 2019-10-22 DIAGNOSIS — I119 Hypertensive heart disease without heart failure: Secondary | ICD-10-CM

## 2019-10-22 MED ORDER — METOPROLOL SUCCINATE ER 100 MG PO TB24: 100.0000 mg | ORAL_TABLET | Freq: Every day | ORAL | 6 refills | Status: DC

## 2019-10-22 NOTE — Progress Notes (Signed)
Primary Care Physician: Renford Dills, MD Primary Cardiologist: Dr Eldridge Dace Primary Electrophysiologist: Dr Johney Frame Referring Physician: Dr Tommas Olp [name] is a 49 y.o. male with a history of paroxysmal atrial fibrillation, PSVT, hypertension, obstructive sleep apnea on CPAP, prior alcohol/cocaine abuse, chronic diastolic heart failure, morbid obesity and ongoing tobacco smoking who presents for follow up in the [facility]. He was evaluated by Dr. Delane Ginger in 4/14 in the hospital during an admission with PAF with RVR.Ritalin was adrug tobe avoided in the futuresince the AFib started with medicine. He converted to NSR and long-term anticoagulation was recommended given his stroke risk. Placed on Xarelto for a CHADS2VASC score of 2. Patient was seen at [facility] with salvos of SVT on 09/30/19. He saw Dr Johney Frame in follow up on 10/01/19 and his flecainide dose was increased. Patient reports that since then, he has not had any heart racing or palpitations. Patient does report that his BP has been running higher at other doctor appointments.   Today, he denies symptoms of palpitations, chest pain, shortness of breath, orthopnea, PND, lower extremity edema, dizziness, presyncope, syncope, snoring, daytime somnolence, bleeding, or neurologic sequela. The patient is tolerating medications without difficulties and is otherwise without complaint today.    Atrial Fibrillation Risk Factors:  he does have symptoms or diagnosis of sleep apnea. he is compliant with CPAP therapy. he does have a history of alcohol use.  he has a BMI of Body mass index is 44.69 kg/m.Marland Kitchen Filed Weights   10/22/19 1048  Weight: (!) 137.3 kg    Family History  Problem Relation Age of Onset  . Diabetes Mother   . Heart attack Father 12  . Stroke Maternal Grandmother   . Stroke Paternal Grandmother   . Diabetes Paternal Grandfather      Atrial Fibrillation  Management history:  Previous antiarrhythmic drugs: flecainide Previous cardioversions: none Previous ablations: none CHADS2VASC score: 2 Anticoagulation history: Xarelto   Past Medical History:  Diagnosis Date  . Diabetes mellitus   . GERD (gastroesophageal reflux disease)   . History of alcohol abuse   . History of cocaine use   . History of echocardiogram    a. Echo 4/14: Moderate LVH, vigorous LVEF, EF 65-70%, normal wall motion, grade 2 diastolic dysfunction, mildly dilated aortic root and ascending aorta, ascending aorta 40 mm, aortic root 38 mm, mild LAE  . Hx of cardiovascular stress test    a. GXT 5/14: No ischemic changes  //  b. ETT-Myoview 3/16:  Low risk, no ischemia, EF 58%  . Hyperlipidemia   . Hypertension   . Morbid obesity (HCC)   . Paroxysmal atrial fibrillation (HCC)    Occurring in 2008, with several recurrence since then (including in the setting of + cocaine on UDS).  . Sleep apnea    uses CPAP  . SVT (supraventricular tachycardia) (HCC)    mid RP SVT 09/2019   Past Surgical History:  Procedure Laterality Date  . APPENDECTOMY    . ROTATOR CUFF REPAIR    . TONSILLECTOMY      Current Outpatient Medications  Medication Sig Dispense Refill  . atorvastatin (LIPITOR) 10 MG tablet Take 10 mg by mouth daily.    . Blood Pressure Monitoring (BLOOD PRESSURE MONITOR/L CUFF) MISC 1 Units by Does not apply route daily. 1 each 0  . flecainide (TAMBOCOR) 100 MG tablet Take 1.5 tablets (150 mg total) by mouth 2 (two) times daily. 90 tablet 3  .  glipiZIDE (GLUCOTROL) 10 MG tablet Take 10 mg by mouth 2 (two) times daily.    Marland Kitchen lisinopril (ZESTRIL) 40 MG tablet Take 40 mg by mouth daily.    . metFORMIN (GLUCOPHAGE) 500 MG tablet Take 1,000 mg by mouth 2 (two) times daily.    . metoprolol succinate (TOPROL-XL) 100 MG 24 hr tablet Take 1 tablet (100 mg total) by mouth daily. 30 tablet 6  . XARELTO 20 MG TABS tablet TAKE 1 TABLET (20 MG TOTAL) BY MOUTH DAILY WITH SUPPER.  (Patient taking differently: Take 20 mg by mouth daily with supper. ) 30 tablet 5   No current facility-administered medications for this encounter.    Allergies  Allergen Reactions  . Shrimp [Shellfish Allergy] Anaphylaxis  . Banana Other (See Comments)    Pt gags  . Watermelon Flavor Nausea And Vomiting  . Peanut-Containing Drug Products Itching and Cough  . Almond Oil Itching    Roof of mouth itches  . Sulfa Antibiotics Itching    Social History   Socioeconomic History  . Marital status: Single    Spouse name: Not on file  . Number of children: 0  . Years of education: 53  . Highest education level: Not on file  Occupational History  . Occupation: Designer, industrial/product: UNEMPLOYED  Tobacco Use  . Smoking status: Current Every Day Smoker    Packs/day: 1.00    Years: 28.00    Pack years: 28.00    Types: Cigarettes  . Smokeless tobacco: Former Neurosurgeon    Types: Snuff  . Tobacco comment: smokes one pack per day 05/12/2019  Substance and Sexual Activity  . Alcohol use: Yes    Alcohol/week: 6.0 standard drinks    Types: 6 Standard drinks or equivalent per week    Comment: monthly  . Drug use: No    Comment: cocaine in the past, none currently  . Sexual activity: Not on file  Other Topics Concern  . Not on file  Social History Narrative   Fun: Photographer       Lives in [place] with sister.      unemployed   Social Determinants of Corporate investment banker Strain:   . Difficulty of Paying Living Expenses: Not on file  Food Insecurity:   . Worried About Programme researcher, broadcasting/film/video in the Last Year: Not on file  . Ran Out of Food in the Last Year: Not on file  Transportation Needs:   . Lack of Transportation (Medical): Not on file  . Lack of Transportation (Non-Medical): Not on file  Physical Activity:   . Days of Exercise per Week: Not on file  . Minutes of Exercise per Session: Not on file  Stress:   . Feeling of Stress : Not on file  Social Connections:    . Frequency of Communication with Friends and Family: Not on file  . Frequency of Social Gatherings with Friends and Family: Not on file  . Attends Religious Services: Not on file  . Active Member of Clubs or Organizations: Not on file  . Attends Banker Meetings: Not on file  . Marital Status: Not on file  Intimate Partner Violence:   . Fear of Current or Ex-Partner: Not on file  . Emotionally Abused: Not on file  . Physically Abused: Not on file  . Sexually Abused: Not on file     ROS- All systems are reviewed and negative except as per the HPI above.  Physical  Exam: Vitals:   10/22/19 1048  BP: (!) 166/104  Pulse: 75  Weight: (!) 137.3 kg  Height: 5\' 9"  (1.753 m)    GEN- The patient is well appearing obese male, alert and oriented x 3 today.   Head- normocephalic, atraumatic Eyes-  Sclera clear, conjunctiva pink Ears- hearing intact Oropharynx- clear Neck- supple  Lungs- Clear to ausculation bilaterally, normal work of breathing Heart- Regular rate and rhythm, no murmurs, rubs or gallops  GI- soft, NT, ND, + BS Extremities- no clubbing, cyanosis, or edema MS- no significant deformity or atrophy Skin- no rash or lesion Psych- euthymic mood, full affect Neuro- strength and sensation are intact  Wt Readings from Last 3 Encounters:  10/22/19 (!) 137.3 kg  10/01/19 (!) 137.9 kg  09/29/19 (!) 137.9 kg    EKG today demonstrates SR HR 75, LAFB, PR 166, QRS 80, QTc 437  Echo 07/08/19 demonstrated  1. Left ventricular ejection fraction, by visual estimation, is 60 to 65%. The left ventricle has normal function. Normal left ventricular size. There is no left ventricular hypertrophy.  2. Left ventricular diastolic Doppler parameters are consistent with impaired relaxation pattern of LV diastolic filling.  3. Global right ventricle has normal systolic function.The right ventricular size is normal. No increase in right ventricular wall thickness.  4. Left atrial  size was normal.  5. Right atrial size was normal.  6. The mitral valve is normal in structure. No evidence of mitral valve regurgitation. No evidence of mitral stenosis.  7. The tricuspid valve is normal in structure. Tricuspid valve regurgitation was not visualized by color flow Doppler.  8. The aortic valve is normal in structure. Aortic valve regurgitation was not visualized by color flow Doppler. Structurally normal aortic valve, with no evidence of sclerosis or stenosis.  9. The pulmonic valve was normal in structure. Pulmonic valve regurgitation is not visualized by color flow Doppler. 10. The inferior vena cava is normal in size with greater than 50% respiratory variability, suggesting right atrial pressure of 3 mmHg.  Epic records are reviewed at length today  Assessment and Plan:  1. Paroxysmal atrial fibrillation/SVT Patient appears to be maintaining SR on higher dose of flecainide. Intervals appropriate.  Continue flecainide 150 mg BID Per Dr Rayann Heman, would consider ablation if he fails higher dose of flecainide. Increase Toprol to 100 mg daily. Lifestyle modification was discussed and encouraged including smoking cessation and weight reduction.  This patients CHA2DS2-VASc Score and unadjusted Ischemic Stroke Rate (% per year) is equal to 2.2 % stroke rate/year from a score of 2  Above score calculated as 1 point each if present [CHF, HTN, DM, Vascular=MI/PAD/Aortic Plaque, Age if 65-74, or Male] Above score calculated as 2 points each if present [Age > 75, or Stroke/TIA/TE]   2. Obesity Body mass index is 44.69 kg/m. Lifestyle modification was discussed at length including regular exercise and weight reduction.  3. Obstructive sleep apnea The importance of adequate treatment of sleep apnea was discussed today in order to improve our ability to maintain sinus rhythm long term. Patient reports compliance with CPAP therapy.  4. HTN Elevated today. Will increase BB as  above. Return for BP check in one week.   Follow up for BP check in one week. Dr Irish Lack per recall. AF clinic in 6 months.    [facility] [address] [place], [place] [address] [phone] 10/22/2019 11:51 AM

## 2019-10-22 NOTE — Patient Instructions (Signed)
Increase metoprolol to 100mg - taking 4 tablets in the am to equal the new dose B/p check in one week Monitor blood pressure readings, purchase a blood pressure cuff to monitor daily

## 2019-10-29 ENCOUNTER — Other Ambulatory Visit: Payer: Self-pay

## 2019-10-29 ENCOUNTER — Encounter (HOSPITAL_COMMUNITY): Payer: Self-pay

## 2019-10-29 ENCOUNTER — Encounter (HOSPITAL_COMMUNITY): Payer: 59 | Admitting: Physician Assistant

## 2019-10-29 ENCOUNTER — Emergency Department (HOSPITAL_COMMUNITY)
Admission: EM | Admit: 2019-10-29 | Discharge: 2019-10-30 | Disposition: A | Discharge status: Home / Self Care | Payer: 59 | Attending: Emergency Medicine | Admitting: Emergency Medicine

## 2019-10-29 DIAGNOSIS — F1021 Alcohol dependence, in remission: Secondary | ICD-10-CM | POA: Diagnosis not present

## 2019-10-29 DIAGNOSIS — R9431 Abnormal electrocardiogram [ECG] [EKG]: Secondary | ICD-10-CM | POA: Insufficient documentation

## 2019-10-29 DIAGNOSIS — E785 Hyperlipidemia, unspecified: Secondary | ICD-10-CM | POA: Insufficient documentation

## 2019-10-29 DIAGNOSIS — Z882 Allergy status to sulfonamides status: Secondary | ICD-10-CM | POA: Diagnosis not present

## 2019-10-29 DIAGNOSIS — E119 Type 2 diabetes mellitus without complications: Secondary | ICD-10-CM | POA: Diagnosis not present

## 2019-10-29 DIAGNOSIS — F1421 Cocaine dependence, in remission: Secondary | ICD-10-CM | POA: Diagnosis not present

## 2019-10-29 DIAGNOSIS — I4891 Unspecified atrial fibrillation: Secondary | ICD-10-CM

## 2019-10-29 DIAGNOSIS — G4733 Obstructive sleep apnea (adult) (pediatric): Secondary | ICD-10-CM | POA: Insufficient documentation

## 2019-10-29 DIAGNOSIS — Z7901 Long term (current) use of anticoagulants: Secondary | ICD-10-CM | POA: Diagnosis not present

## 2019-10-29 DIAGNOSIS — I5032 Chronic diastolic (congestive) heart failure: Secondary | ICD-10-CM | POA: Diagnosis not present

## 2019-10-29 DIAGNOSIS — Z91013 Allergy to seafood: Secondary | ICD-10-CM | POA: Insufficient documentation

## 2019-10-29 DIAGNOSIS — I480 Paroxysmal atrial fibrillation: Secondary | ICD-10-CM | POA: Insufficient documentation

## 2019-10-29 DIAGNOSIS — R0789 Other chest pain: Secondary | ICD-10-CM | POA: Diagnosis present

## 2019-10-29 DIAGNOSIS — I471 Supraventricular tachycardia: Secondary | ICD-10-CM | POA: Diagnosis not present

## 2019-10-29 DIAGNOSIS — Z7984 Long term (current) use of oral hypoglycemic drugs: Secondary | ICD-10-CM | POA: Diagnosis not present

## 2019-10-29 DIAGNOSIS — R0602 Shortness of breath: Secondary | ICD-10-CM | POA: Diagnosis not present

## 2019-10-29 DIAGNOSIS — Z79899 Other long term (current) drug therapy: Secondary | ICD-10-CM | POA: Diagnosis not present

## 2019-10-29 DIAGNOSIS — I110 Hypertensive heart disease with heart failure: Secondary | ICD-10-CM | POA: Diagnosis not present

## 2019-10-29 DIAGNOSIS — Z8249 Family history of ischemic heart disease and other diseases of the circulatory system: Secondary | ICD-10-CM | POA: Diagnosis not present

## 2019-10-29 DIAGNOSIS — I444 Left anterior fascicular block: Secondary | ICD-10-CM | POA: Diagnosis not present

## 2019-10-29 DIAGNOSIS — F17210 Nicotine dependence, cigarettes, uncomplicated: Secondary | ICD-10-CM | POA: Diagnosis not present

## 2019-10-29 DIAGNOSIS — Z6841 Body Mass Index (BMI) 40.0 and over, adult: Secondary | ICD-10-CM | POA: Insufficient documentation

## 2019-10-29 DIAGNOSIS — Z833 Family history of diabetes mellitus: Secondary | ICD-10-CM | POA: Insufficient documentation

## 2019-10-29 DIAGNOSIS — Z91018 Allergy to other foods: Secondary | ICD-10-CM | POA: Insufficient documentation

## 2019-10-29 DIAGNOSIS — Z823 Family history of stroke: Secondary | ICD-10-CM | POA: Diagnosis not present

## 2019-10-29 NOTE — ED Triage Notes (Signed)
Pt states that he was at work and became hot and flushed and then he felt his heart begin to beat fast Pt states he tried all the vagal maneuvers and nothing has helped

## 2019-10-29 NOTE — ED Provider Notes (Addendum)
[facility]-EMERGENCY DEPT Provider Note  CSN: [id number] Arrival date & time: 10/29/19 2302  Chief Complaint(s) No chief complaint on file.  HPI [name] is a 49 y.o. male with a past medical history listed below including paroxysmal A. fib and SVT who presents to the emergency department with sudden onset tachycardia, palpitations with associated diaphoresis that began 1 hour prior to arrival.  Patient endorsed mild chest pressure and shortness of breath related to the tachycardia.  Denied any recent fevers or infections.  No nausea or vomiting.  No diarrhea.  No known triggers.  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  Reports that he is compliant with his Xarelto and metoprolol.   Patient denies any recent alcohol or cocaine use.  HPI  Past Medical History Past Medical History:  Diagnosis Date  . Diabetes mellitus   . GERD (gastroesophageal reflux disease)   . History of alcohol abuse   . History of cocaine use   . History of echocardiogram    a. Echo 4/14: Moderate LVH, vigorous LVEF, EF 65-70%, normal wall motion, grade 2 diastolic dysfunction, mildly dilated aortic root and ascending aorta, ascending aorta 40 mm, aortic root 38 mm, mild LAE  . Hx of cardiovascular stress test    a. GXT 5/14: No ischemic changes  //  b. ETT-Myoview 3/16:  Low risk, no ischemia, EF 58%  . Hyperlipidemia   . Hypertension   . Morbid obesity (HCC)   . Paroxysmal atrial fibrillation (HCC)    Occurring in 2008, with several recurrence since then (including in the setting of + cocaine on UDS).  . Sleep apnea    uses CPAP  . SVT (supraventricular tachycardia) (HCC)    mid RP SVT 09/2019   Patient Active Problem List   Diagnosis Date Noted  . Acquired thrombophilia (HCC) 10/22/2019  . Palpitations 09/30/2019  . Acute respiratory failure with hypoxia (HCC) 09/30/2019  . SVT (supraventricular  tachycardia) (HCC) 07/08/2019  . Acute pancreatitis 01/22/2019  . Class 3 obesity due to excess calories with serious comorbidity and body mass index (BMI) of 40.0 to 44.9 in adult 12/08/2016  . PAF (paroxysmal atrial fibrillation) (HCC) 01/23/2013  . Diabetes mellitus (HCC) 01/23/2013  . Hypertensive heart disease 01/23/2013  . Tobacco user 01/23/2013  . Chronic diastolic heart failure (HCC) 01/21/2013  . Community acquired pneumonia 01/20/2013  . 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 Shoulder pain, right 02/15/2011  . Seasonal and perennial allergic rhinitis 02/13/2008  . Dyslipidemia 02/12/2008  . OBESITY, MORBID 02/12/2008  . COCAINE Abuse, past hx 02/12/2008  . Obstructive sleep apnea 02/12/2008   Home Medication(s) Prior to Admission medications   Medication Sig Start Date End Date Taking? Authorizing Provider  atorvastatin (LIPITOR) 10 MG tablet Take 10 mg by mouth daily. 09/16/19  Yes [provider]  flecainide (TAMBOCOR) 100 MG tablet Take 1.5 tablets (150 mg total) by mouth 2 (two) times daily. 10/01/19  Yes Allred, Fayrene Fearing, MD  glipiZIDE (GLUCOTROL) 10 MG tablet Take 10 mg by mouth 2 (two) times daily. 01/06/19  Yes [provider]  lisinopril (ZESTRIL) 40 MG tablet Take 40 mg by mouth daily. 10/14/19  Yes [provider]  metFORMIN (GLUCOPHAGE) 500 MG tablet Take 1,000 mg by mouth daily with breakfast.  01/06/19  Yes [provider]  metoprolol succinate (TOPROL-XL) 100 MG 24 hr tablet Take 1 tablet (100 mg total) by mouth  daily. 10/22/19  Yes Fenton, Clint R, PA  XARELTO 20 MG TABS tablet TAKE 1 TABLET (20 MG TOTAL) BY MOUTH DAILY WITH SUPPER. Patient taking differently: Take 20 mg by mouth daily with supper.  08/21/19  Yes Jettie Booze, MD  Blood Pressure Monitoring (BLOOD PRESSURE MONITOR/L CUFF) MISC 1 Units by Does not apply route daily. 03/19/19   Roque Lias                                                                                                                                     Past Surgical History Past Surgical History:  Procedure Laterality Date  . APPENDECTOMY    . ROTATOR CUFF REPAIR    . TONSILLECTOMY     Family History Family History  Problem Relation Age of Onset  . Diabetes Mother   . Heart attack Father 66  . Stroke Maternal Grandmother   . Stroke Paternal Grandmother   . Diabetes Paternal Grandfather     Social History Social History   Tobacco Use  . Smoking status: Current Every Day Smoker    Packs/day: 1.00    Years: 28.00    Pack years: 28.00    Types: Cigarettes  . Smokeless tobacco: Former Systems developer    Types: Snuff  . Tobacco comment: smokes one pack per day 05/12/2019  Substance Use Topics  . Alcohol use: Yes    Alcohol/week: 6.0 standard drinks    Types: 6 Standard drinks or equivalent per week    Comment: monthly  . Drug use: No    Comment: cocaine in the past, none currently   Allergies Shrimp [shellfish allergy], Banana, Watermelon flavor, Peanut-containing drug products, Almond oil, and Sulfa antibiotics  Review of Systems Review of Systems All other systems are reviewed and are negative for acute change except as noted in the HPI  Physical Exam Vital Signs  I have reviewed the triage vital signs BP (!) 152/84 (BP Location: Left Arm)   Pulse 74   Temp 98.2 F (36.8 C) (Oral)   Resp (!) 23   Ht 5\' 9"  (1.753 m)   Wt (!) 137.9 kg   SpO2 98%   BMI 44.89 kg/m   Physical Exam Vitals reviewed.  Constitutional:      General: He is not in acute distress.    Appearance: He is well-developed. He is obese. He is not diaphoretic.  HENT:     Head: Normocephalic and atraumatic.     Nose: Nose normal.  Eyes:     General: No scleral icterus.       Right eye: No discharge.        Left eye: No discharge.     Conjunctiva/sclera: Conjunctivae normal.     Pupils: Pupils are equal, round, and reactive to light.  Cardiovascular:     Rate and Rhythm: Regular  rhythm. Tachycardia present.     Heart sounds: No murmur.  No friction rub. No gallop.   Pulmonary:     Effort: Pulmonary effort is normal. No respiratory distress.     Breath sounds: Normal breath sounds. No stridor. No rales.  Abdominal:     General: There is no distension.     Palpations: Abdomen is soft.     Tenderness: There is no abdominal tenderness.  Musculoskeletal:        General: No tenderness.     Cervical back: Normal range of motion and neck supple.  Skin:    General: Skin is warm and dry.     Findings: No erythema or rash.  Neurological:     Mental Status: He is alert and oriented to person, place, and time.     ED Results and Treatments Labs (all labs ordered are listed, but only abnormal results are displayed) Labs Reviewed  CBC WITH DIFFERENTIAL/PLATELET - Abnormal; Notable for the following components:      Result Value   WBC 13.0 (*)    Neutro Abs 8.5 (*)    Monocytes Absolute 1.4 (*)    Eosinophils Absolute 0.7 (*)    All other components within normal limits  BASIC METABOLIC PANEL - Abnormal; Notable for the following components:   Potassium 5.7 (*)    CO2 20 (*)    Glucose, Bld 190 (*)    BUN 26 (*)    Creatinine, Ser 1.81 (*)    GFR calc non Af Amer 43 (*)    GFR calc Af Amer 50 (*)    All other components within normal limits  CBG MONITORING, ED - Abnormal; Notable for the following components:   Glucose-Capillary 168 (*)    All other components within normal limits  I-STAT CHEM 8, ED                                                                                                                         EKG  EKG Interpretation  Date/Time:  Wednesday October 29 2019 23:08:43 EST Ventricular Rate:  161 PR Interval:    QRS Duration: 81 QT Interval:  314 QTC Calculation: 514 R Axis:   -23 Text Interpretation: Supraventricular tachycardia Consider left ventricular hypertrophy Anterior ST elevation, probably due to LVH Prolonged QT interval  Baseline wander in lead(s) II V1 Confirmed by Drema Pry [phone]) on 10/30/2019 12:02:49 AM      Radiology No results found.  Pertinent labs & imaging results that were available during my care of the patient were reviewed by me and considered in my medical decision making (see chart for details).  Medications Ordered in ED Medications  etomidate (AMIDATE) injection 20 mg (20 mg Intravenous Not Given 10/30/19 0218)  metoprolol tartrate (LOPRESSOR) injection 5 mg (5 mg Intravenous Not Given 10/30/19 0217)  sodium chloride 0.9 % bolus 1,000 mL (0 mLs Intravenous Stopped 10/30/19 0217)  Procedures .Critical Care Performed by: Nira Conn, MD Authorized by: Nira Conn, MD     CRITICAL CARE Performed by: Amadeo Garnet Dillinger Aston Total critical care time: 35 minutes Critical care time was exclusive of separately billable procedures and treating other patients. Critical care was necessary to treat or prevent imminent or life-threatening deterioration. Critical care was time spent personally by me on the following activities: development of treatment plan with patient and/or surrogate as well as nursing, discussions with consultants, evaluation of patient's response to treatment, examination of patient, obtaining history from patient or surrogate, ordering and performing treatments and interventions, ordering and review of laboratory studies, ordering and review of radiographic studies, pulse oximetry and re-evaluation of patient's condition.  (including critical care time)  Medical Decision Making / ED Course I have reviewed the nursing notes for this encounter and the patient's prior records (if available in EHR or on provided paperwork).   Daltin Crist was evaluated in Emergency Department on 10/30/2019 for the symptoms described in  the history of present illness. He was evaluated in the context of the global COVID-19 pandemic, which necessitated consideration that the patient might be at risk for infection with the SARS-CoV-2 virus that causes COVID-19. Institutional protocols and algorithms that pertain to the evaluation of patients at risk for COVID-19 are in a state of rapid change based on information released by regulatory bodies including the CDC and federal and state organizations. These policies and algorithms were followed during the patient's care in the ED.  Initial EKG appeared to be SVT however on telemetry patient had irregularly irregular rhythm most consistent with A. fib RVR.  Screening labs obtain patient had mildly elevated potassium possibly due to hemolysis due to him being a hard stick.  Patient required IV team consultation to obtain access.  During that time patient's heart rate slowly improved.  We initially were thinking of cardioverting the patient but given the improvement, we will continue to monitor and provide patient with IV fluids.  We will also give patient 5 mg of Lopressor.  Patient was provided with IV fluids.  Prior to receiving Lopressor, patient's heart rate improved to the 80s and in normal sinus rhythm now.  After additional period of monitoring, patient remained in normal sinus rhythm.   Felt to be stable for discharge home with strict return precautions.        Final Clinical Impression(s) / ED Diagnoses Final diagnoses:  Atrial fibrillation with RVR (HCC)     The patient appears reasonably screened and/or stabilized for discharge and I doubt any other medical condition or other [facility] requiring further screening, evaluation, or treatment in the ED at this time prior to discharge.  Disposition: Discharge  Condition: Good  I have discussed the results, Dx and Tx plan with the patient who expressed understanding and agree(s) with the plan. Discharge instructions discussed at  great length. The patient was given strict return precautions who verbalized understanding of the instructions. No further questions at time of discharge.    ED Discharge Orders    None       Follow Up: Cardiology  Schedule an appointment as soon as possible for a visit  As needed     This chart was dictated using voice recognition software.  Despite best efforts to proofread,  errors can occur which can change the documentation meaning.       Nira Conn, MD 10/30/19 [phone]

## 2019-10-30 ENCOUNTER — Ambulatory Visit (HOSPITAL_BASED_OUTPATIENT_CLINIC_OR_DEPARTMENT_OTHER)
Admission: RE | Admit: 2019-10-30 | Discharge: 2019-10-30 | Disposition: A | Discharge status: Home / Self Care | Payer: 59 | Source: Ambulatory Visit | Attending: Physician Assistant | Admitting: Physician Assistant

## 2019-10-30 ENCOUNTER — Encounter (HOSPITAL_COMMUNITY): Payer: Self-pay | Admitting: Physician Assistant

## 2019-10-30 VITALS — BP 124/66 | HR 76 | Ht 69.0 in | Wt 301.8 lb

## 2019-10-30 DIAGNOSIS — Z823 Family history of stroke: Secondary | ICD-10-CM | POA: Insufficient documentation

## 2019-10-30 DIAGNOSIS — F1421 Cocaine dependence, in remission: Secondary | ICD-10-CM | POA: Insufficient documentation

## 2019-10-30 DIAGNOSIS — R0602 Shortness of breath: Secondary | ICD-10-CM | POA: Diagnosis not present

## 2019-10-30 DIAGNOSIS — D6869 Other thrombophilia: Secondary | ICD-10-CM

## 2019-10-30 DIAGNOSIS — G4733 Obstructive sleep apnea (adult) (pediatric): Secondary | ICD-10-CM | POA: Insufficient documentation

## 2019-10-30 DIAGNOSIS — I480 Paroxysmal atrial fibrillation: Secondary | ICD-10-CM

## 2019-10-30 DIAGNOSIS — R9431 Abnormal electrocardiogram [ECG] [EKG]: Secondary | ICD-10-CM | POA: Insufficient documentation

## 2019-10-30 DIAGNOSIS — I471 Supraventricular tachycardia: Secondary | ICD-10-CM | POA: Insufficient documentation

## 2019-10-30 DIAGNOSIS — E785 Hyperlipidemia, unspecified: Secondary | ICD-10-CM | POA: Insufficient documentation

## 2019-10-30 DIAGNOSIS — F1021 Alcohol dependence, in remission: Secondary | ICD-10-CM | POA: Insufficient documentation

## 2019-10-30 DIAGNOSIS — Z8249 Family history of ischemic heart disease and other diseases of the circulatory system: Secondary | ICD-10-CM | POA: Insufficient documentation

## 2019-10-30 DIAGNOSIS — Z91018 Allergy to other foods: Secondary | ICD-10-CM | POA: Insufficient documentation

## 2019-10-30 DIAGNOSIS — Z91010 Allergy to peanuts: Secondary | ICD-10-CM | POA: Insufficient documentation

## 2019-10-30 DIAGNOSIS — E119 Type 2 diabetes mellitus without complications: Secondary | ICD-10-CM | POA: Insufficient documentation

## 2019-10-30 DIAGNOSIS — Z882 Allergy status to sulfonamides status: Secondary | ICD-10-CM | POA: Insufficient documentation

## 2019-10-30 DIAGNOSIS — I5032 Chronic diastolic (congestive) heart failure: Secondary | ICD-10-CM | POA: Insufficient documentation

## 2019-10-30 DIAGNOSIS — I110 Hypertensive heart disease with heart failure: Secondary | ICD-10-CM | POA: Insufficient documentation

## 2019-10-30 DIAGNOSIS — Z91013 Allergy to seafood: Secondary | ICD-10-CM | POA: Insufficient documentation

## 2019-10-30 DIAGNOSIS — Z6841 Body Mass Index (BMI) 40.0 and over, adult: Secondary | ICD-10-CM | POA: Insufficient documentation

## 2019-10-30 DIAGNOSIS — I444 Left anterior fascicular block: Secondary | ICD-10-CM | POA: Insufficient documentation

## 2019-10-30 DIAGNOSIS — F17210 Nicotine dependence, cigarettes, uncomplicated: Secondary | ICD-10-CM | POA: Insufficient documentation

## 2019-10-30 DIAGNOSIS — Z833 Family history of diabetes mellitus: Secondary | ICD-10-CM | POA: Insufficient documentation

## 2019-10-30 LAB — CBC WITH DIFFERENTIAL/PLATELET
Abs Immature Granulocytes: 0.05 10*3/uL (ref 0.00–0.07)
Basophils Absolute: 0.1 10*3/uL (ref 0.0–0.1)
Basophils Relative: 1 %
Eosinophils Absolute: 0.7 10*3/uL — ABNORMAL HIGH (ref 0.0–0.5)
Eosinophils Relative: 6 %
HCT: 42.3 % (ref 39.0–52.0)
Hemoglobin: 13.7 g/dL (ref 13.0–17.0)
Immature Granulocytes: 0 %
Lymphocytes Relative: 17 %
Lymphs Abs: 2.2 10*3/uL (ref 0.7–4.0)
MCH: 28.2 pg (ref 26.0–34.0)
MCHC: 32.4 g/dL (ref 30.0–36.0)
MCV: 87.2 fL (ref 80.0–100.0)
Monocytes Absolute: 1.4 10*3/uL — ABNORMAL HIGH (ref 0.1–1.0)
Monocytes Relative: 11 %
Neutro Abs: 8.5 10*3/uL — ABNORMAL HIGH (ref 1.7–7.7)
Neutrophils Relative %: 65 %
Platelets: 315 10*3/uL (ref 150–400)
RBC: 4.85 MIL/uL (ref 4.22–5.81)
RDW: 13 % (ref 11.5–15.5)
WBC: 13 10*3/uL — ABNORMAL HIGH (ref 4.0–10.5)
nRBC: 0 % (ref 0.0–0.2)

## 2019-10-30 LAB — BASIC METABOLIC PANEL
Anion gap: 11 (ref 5–15)
BUN: 26 mg/dL — ABNORMAL HIGH (ref 6–20)
CO2: 20 mmol/L — ABNORMAL LOW (ref 22–32)
Calcium: 9.5 mg/dL (ref 8.9–10.3)
Chloride: 107 mmol/L (ref 98–111)
Creatinine, Ser: 1.81 mg/dL — ABNORMAL HIGH (ref 0.61–1.24)
GFR calc Af Amer: 50 mL/min — ABNORMAL LOW (ref >60)
GFR calc non Af Amer: 43 mL/min — ABNORMAL LOW (ref >60)
Glucose, Bld: 190 mg/dL — ABNORMAL HIGH (ref 70–99)
Potassium: 5.7 mmol/L — ABNORMAL HIGH (ref 3.5–5.1)
Sodium: 138 mmol/L (ref 135–145)

## 2019-10-30 LAB — CBG MONITORING, ED: Glucose-Capillary: 168 mg/dL — ABNORMAL HIGH (ref 70–99)

## 2019-10-30 MED ORDER — METOPROLOL TARTRATE 5 MG/5ML IV SOLN
5.0000 mg | Freq: Once | INTRAVENOUS | Status: DC
  Filled 2019-10-30: qty 5

## 2019-10-30 MED ORDER — DILTIAZEM HCL 30 MG PO TABS: ORAL_TABLET | ORAL | 2 refills | Status: DC

## 2019-10-30 MED ORDER — SODIUM CHLORIDE 0.9 % IV BOLUS
1000.0000 mL | Freq: Once | INTRAVENOUS | Status: AC
  Administered 2019-10-30: 1000 mL via INTRAVENOUS

## 2019-10-30 MED ORDER — ETOMIDATE 2 MG/ML IV SOLN
20.0000 mg | Freq: Once | INTRAVENOUS | Status: DC
  Filled 2019-10-30: qty 10

## 2019-10-30 NOTE — Patient Instructions (Signed)
Diltiazem 30mg  Take 1 Tablet Every 4 Hours As Needed For HR >100 and BP >100

## 2019-10-30 NOTE — ED Notes (Signed)
Pt. Documented in error see above note in chart. 

## 2019-10-30 NOTE — Progress Notes (Signed)
Primary Care Physician: Seward Carol, MD Primary Cardiologist: Dr Irish Lack Primary Electrophysiologist: Dr Rayann Heman Referring Physician: Dr Criss Alvine [name] is a 49 y.o. male with a history of paroxysmal atrial fibrillation, PSVT, hypertension, obstructive sleep apnea on CPAP, prior alcohol/cocaine abuse, chronic diastolic heart failure, morbid obesity and ongoing tobacco smoking who presents for follow up in the [facility]. He was evaluated by Dr. Liam Rogers in 4/14 in the hospital during an admission with PAF with RVR.Ritalin was adrug tobe avoided in the futuresince the AFib started with medicine. He converted to NSR and long-term anticoagulation was recommended given his stroke risk. Placed on Xarelto for a CHADS2VASC score of 2. Patient was seen at [facility] with salvos of SVT on 09/30/19. He saw Dr Rayann Heman in follow up on 10/01/19 and his flecainide dose was increased.   On follow up today, patient presented to ER on 10/29/19 with symptoms of tachypalpitations and diaphoresis. He was found to be in afib with RVR. He converted to SR with IV fluids. There were no triggers that he could identify.   Today, he denies symptoms of chest pain, shortness of breath, orthopnea, PND, lower extremity edema, dizziness, presyncope, syncope, snoring, daytime somnolence, bleeding, or neurologic sequela. The patient is tolerating medications without difficulties and is otherwise without complaint today.    Atrial Fibrillation Risk Factors:  he does have symptoms or diagnosis of sleep apnea. he is compliant with CPAP therapy. he does have a history of alcohol use.  he has a BMI of Body mass index is 44.57 kg/m.Marland Kitchen Filed Weights   10/30/19 1322  Weight: (!) 136.9 kg    Family History  Problem Relation Age of Onset  . Diabetes Mother   . Heart attack Father 32  . Stroke Maternal Grandmother   . Stroke Paternal Grandmother   . Diabetes  Paternal Grandfather      Atrial Fibrillation Management history:  Previous antiarrhythmic drugs: flecainide Previous cardioversions: none Previous ablations: none CHADS2VASC score: 2 Anticoagulation history: Xarelto   Past Medical History:  Diagnosis Date  . Diabetes mellitus   . GERD (gastroesophageal reflux disease)   . History of alcohol abuse   . History of cocaine use   . History of echocardiogram    a. Echo 4/14: Moderate LVH, vigorous LVEF, EF 65-70%, normal wall motion, grade 2 diastolic dysfunction, mildly dilated aortic root and ascending aorta, ascending aorta 40 mm, aortic root 38 mm, mild LAE  . Hx of cardiovascular stress test    a. GXT 5/14: No ischemic changes  //  b. ETT-Myoview 3/16:  Low risk, no ischemia, EF 58%  . Hyperlipidemia   . Hypertension   . Morbid obesity ([place])   . Paroxysmal atrial fibrillation ([place])    Occurring in 2008, with several recurrence since then (including in the setting of + cocaine on UDS).  . Sleep apnea    uses CPAP  . SVT (supraventricular tachycardia) ([place])    mid RP SVT 09/2019   Past Surgical History:  Procedure Laterality Date  . APPENDECTOMY    . ROTATOR CUFF REPAIR    . TONSILLECTOMY      Current Outpatient Medications  Medication Sig Dispense Refill  . atorvastatin (LIPITOR) 10 MG tablet Take 10 mg by mouth daily.    . Blood Pressure Monitoring (BLOOD PRESSURE MONITOR/L CUFF) MISC 1 Units by Does not apply route daily. 1 each 0  . flecainide (TAMBOCOR) 100 MG tablet Take 1.5  tablets (150 mg total) by mouth 2 (two) times daily. 90 tablet 3  . glipiZIDE (GLUCOTROL) 10 MG tablet Take 10 mg by mouth 2 (two) times daily.    Marland Kitchen lisinopril (ZESTRIL) 40 MG tablet Take 40 mg by mouth daily.    . metFORMIN (GLUCOPHAGE) 500 MG tablet Take 1,000 mg by mouth daily with breakfast.     . metoprolol succinate (TOPROL-XL) 100 MG 24 hr tablet Take 1 tablet (100 mg total) by mouth daily. 30 tablet 6  . XARELTO 20 MG TABS tablet TAKE  1 TABLET (20 MG TOTAL) BY MOUTH DAILY WITH SUPPER. (Patient taking differently: Take 20 mg by mouth daily with supper. ) 30 tablet 5  . diltiazem (CARDIZEM) 30 MG tablet Take 1 Tablet Every 4 Hours As Needed For HR >100 45 tablet 2   No current facility-administered medications for this encounter.    Allergies  Allergen Reactions  . Shrimp [Shellfish Allergy] Anaphylaxis  . Banana Other (See Comments)    Pt gags  . Watermelon Flavor Nausea And Vomiting  . Peanut-Containing Drug Products Itching and Cough  . Almond Oil Itching    Roof of mouth itches  . Sulfa Antibiotics Itching    Social History   Socioeconomic History  . Marital status: Single    Spouse name: Not on file  . Number of children: 0  . Years of education: 30  . Highest education level: Not on file  Occupational History  . Occupation: Designer, industrial/product: UNEMPLOYED  Tobacco Use  . Smoking status: Current Every Day Smoker    Packs/day: 1.00    Years: 28.00    Pack years: 28.00    Types: Cigarettes  . Smokeless tobacco: Former Neurosurgeon    Types: Snuff  . Tobacco comment: smokes one pack per day 05/12/2019  Substance and Sexual Activity  . Alcohol use: Not Currently    Alcohol/week: 6.0 standard drinks    Types: 6 Standard drinks or equivalent per week    Comment: monthly  . Drug use: No    Comment: cocaine in the past, none currently  . Sexual activity: Not on file  Other Topics Concern  . Not on file  Social History Narrative   Fun: Photographer       Lives in [place] with sister.      unemployed   Social Determinants of Corporate investment banker Strain:   . Difficulty of Paying Living Expenses: Not on file  Food Insecurity:   . Worried About Programme researcher, broadcasting/film/video in the Last Year: Not on file  . Ran Out of Food in the Last Year: Not on file  Transportation Needs:   . Lack of Transportation (Medical): Not on file  . Lack of Transportation (Non-Medical): Not on file  Physical Activity:     . Days of Exercise per Week: Not on file  . Minutes of Exercise per Session: Not on file  Stress:   . Feeling of Stress : Not on file  Social Connections:   . Frequency of Communication with Friends and Family: Not on file  . Frequency of Social Gatherings with Friends and Family: Not on file  . Attends Religious Services: Not on file  . Active Member of Clubs or Organizations: Not on file  . Attends Banker Meetings: Not on file  . Marital Status: Not on file  Intimate Partner Violence:   . Fear of Current or Ex-Partner: Not on file  .  Emotionally Abused: Not on file  . Physically Abused: Not on file  . Sexually Abused: Not on file     ROS- All systems are reviewed and negative except as per the HPI above.  Physical Exam: Vitals:   10/30/19 1322  BP: 124/66  Pulse: 76  Weight: (!) 136.9 kg  Height: 5\' 9"  (1.753 m)    GEN- The patient is well appearing obese male, alert and oriented x 3 today.   HEENT-head normocephalic, atraumatic, sclera clear, conjunctiva pink, hearing intact, trachea midline. Lungs- Clear to ausculation bilaterally, normal work of breathing Heart- Regular rate and rhythm, no murmurs, rubs or gallops  GI- soft, NT, ND, + BS Extremities- no clubbing, cyanosis, or edema MS- no significant deformity or atrophy Skin- no rash or lesion Psych- euthymic mood, full affect Neuro- strength and sensation are intact   Wt Readings from Last 3 Encounters:  10/30/19 (!) 136.9 kg  10/29/19 (!) 137.9 kg  10/22/19 (!) 137.3 kg    EKG today demonstrates SR HR 76, LAFB, PR 160, QRS 82, QTc 436  Echo 07/08/19 demonstrated  1. Left ventricular ejection fraction, by visual estimation, is 60 to 65%. The left ventricle has normal function. Normal left ventricular size. There is no left ventricular hypertrophy.  2. Left ventricular diastolic Doppler parameters are consistent with impaired relaxation pattern of LV diastolic filling.  3. Global right  ventricle has normal systolic function.The right ventricular size is normal. No increase in right ventricular wall thickness.  4. Left atrial size was normal.  5. Right atrial size was normal.  6. The mitral valve is normal in structure. No evidence of mitral valve regurgitation. No evidence of mitral stenosis.  7. The tricuspid valve is normal in structure. Tricuspid valve regurgitation was not visualized by color flow Doppler.  8. The aortic valve is normal in structure. Aortic valve regurgitation was not visualized by color flow Doppler. Structurally normal aortic valve, with no evidence of sclerosis or stenosis.  9. The pulmonic valve was normal in structure. Pulmonic valve regurgitation is not visualized by color flow Doppler. 10. The inferior vena cava is normal in size with greater than 50% respiratory variability, suggesting right atrial pressure of 3 mmHg.  Epic records are reviewed at length today  Assessment and Plan:  1. Paroxysmal atrial fibrillation/SVT Patient continues to have symptomatic SVT/rapid afib despite higher doses of flecainide and metoprolol.  Continue flecainide 150 mg BID for now. Will refer to Dr 07/10/19 to consider ablation.  Continue Toprol to 100 mg daily. Will start diltiazem 30 mg PRN q 4 hours for heart racing. Continue Xarelto 20 mg daily. Lifestyle modification was discussed and encouraged including smoking cessation and weight reduction.  This patients CHA2DS2-VASc Score and unadjusted Ischemic Stroke Rate (% per year) is equal to 2.2 % stroke rate/year from a score of 2  Above score calculated as 1 point each if present [CHF, HTN, DM, Vascular=MI/PAD/Aortic Plaque, Age if 65-74, or Male] Above score calculated as 2 points each if present [Age > 75, or Stroke/TIA/TE]   2. Obesity Body mass index is 44.57 kg/m. Lifestyle modification was discussed and encouraged including regular physical activity and weight reduction.  3. Obstructive sleep  apnea The importance of adequate treatment of sleep apnea was discussed today in order to improve our ability to maintain sinus rhythm long term. Patient reports compliance with CPAP therapy.  4. HTN Stable, no changes today.   Follow up with Dr 12-27-2000 to discuss ablation candidacy.  Jorja Loa PA-C [facility] [address] [place], [place] [address] [phone] 10/30/2019 1:44 PM

## 2019-10-30 NOTE — ED Notes (Signed)
IV team at bedside 

## 2019-11-10 ENCOUNTER — Encounter: Payer: Self-pay | Admitting: Internal Medicine

## 2019-11-10 ENCOUNTER — Telehealth: Payer: Self-pay

## 2019-11-10 ENCOUNTER — Other Ambulatory Visit: Payer: Self-pay

## 2019-11-10 ENCOUNTER — Telehealth (INDEPENDENT_AMBULATORY_CARE_PROVIDER_SITE_OTHER): Payer: 59 | Admitting: Internal Medicine

## 2019-11-10 ENCOUNTER — Ambulatory Visit: Payer: 59 | Admitting: Podiatry

## 2019-11-10 VITALS — Ht 69.0 in | Wt 304.0 lb

## 2019-11-10 DIAGNOSIS — I480 Paroxysmal atrial fibrillation: Secondary | ICD-10-CM | POA: Diagnosis not present

## 2019-11-10 DIAGNOSIS — G4733 Obstructive sleep apnea (adult) (pediatric): Secondary | ICD-10-CM | POA: Diagnosis not present

## 2019-11-10 DIAGNOSIS — I471 Supraventricular tachycardia: Secondary | ICD-10-CM

## 2019-11-10 DIAGNOSIS — I10 Essential (primary) hypertension: Secondary | ICD-10-CM

## 2019-11-10 DIAGNOSIS — I4891 Unspecified atrial fibrillation: Secondary | ICD-10-CM

## 2019-11-10 NOTE — Telephone Encounter (Signed)
Outreach made to Pt.  Pt scheduled for ablation on February 11 first case.  No additional lab work needed.  Pt scheduled for covid test.  Cardiac CT ordered.  Also made outreach to [place] [place] d/t Pt kidney function for further advisement.  Verbal instructions given to Pt.  Will mail copy.  Work up [employer]

## 2019-11-10 NOTE — Progress Notes (Signed)
Electrophysiology TeleHealth Note  Due to national recommendations of social distancing due to COVID 19, an audio telehealth visit is felt to be most appropriate for this patient at this time.  Verbal consent was obtained by me for the telehealth visit today.  The patient does not have capability for a virtual visit.  A phone visit is therefore required today.   Date:  11/10/2019   ID:  [name], DOB [birth year], MRN [id number]  Location: patient's home  Provider location:  [facility]  Evaluation Performed: Follow-up visit  PCP:  Renford Dills, MD   Electrophysiologist:  Dr Johney Frame  Chief Complaint:  palpitations  History of Present Illness:    [name] is a 49 y.o. male who presents via telehealth conferencing today.  Since last being seen in our clinic, the patient reports doing reasonably  Well.  He has had additional SVT.  ekg from 10/29/2019 reveals mid to late RP SVT.  He reports that this spontaneously terminated.  He finds that exercise and strenuous activity trigger his arrhythmia.  He has limited his activity.  Today, he denies symptoms of chest pain, shortness of breath,  lower extremity edema, dizziness, presyncope, or syncope. He notices that the right side of his mouth is "twisted" some times. The patient is otherwise without complaint today.  The patient denies symptoms of fevers, chills, cough, or new SOB worrisome for COVID 19.  Past Medical History:  Diagnosis Date  . Diabetes mellitus   . GERD (gastroesophageal reflux disease)   . History of alcohol abuse   . History of cocaine use   . History of echocardiogram    a. Echo 4/14: Moderate LVH, vigorous LVEF, EF 65-70%, normal wall motion, grade 2 diastolic dysfunction, mildly dilated aortic root and ascending aorta, ascending aorta 40 mm, aortic root 38 mm, mild LAE  . Hx of cardiovascular stress test    a. GXT 5/14: No ischemic changes  //  b. ETT-Myoview 3/16:  Low risk, no ischemia,  EF 58%  . Hyperlipidemia   . Hypertension   . Morbid obesity ([facility])   . Paroxysmal atrial fibrillation ([facility])    Occurring in 2008, with several recurrence since then (including in the setting of + cocaine on UDS).  . Sleep apnea    uses CPAP  . SVT (supraventricular tachycardia) ([facility])    mid to long RP SVT 09/2019    Past Surgical History:  Procedure Laterality Date  . APPENDECTOMY    . ROTATOR CUFF REPAIR    . TONSILLECTOMY      Current Outpatient Medications  Medication Sig Dispense Refill  . atorvastatin (LIPITOR) 10 MG tablet Take 10 mg by mouth daily.    . Blood Pressure Monitoring (BLOOD PRESSURE MONITOR/L CUFF) MISC 1 Units by Does not apply route daily. 1 each 0  . diltiazem (CARDIZEM) 30 MG tablet Take 1 Tablet Every 4 Hours As Needed For HR >100 45 tablet 2  . flecainide (TAMBOCOR) 100 MG tablet Take 1.5 tablets (150 mg total) by mouth 2 (two) times daily. 90 tablet 3  . glipiZIDE (GLUCOTROL) 10 MG tablet Take 10 mg by mouth 2 (two) times daily.    Marland Kitchen lisinopril (ZESTRIL) 40 MG tablet Take 40 mg by mouth daily.    . metFORMIN (GLUCOPHAGE) 500 MG tablet Take 1,000 mg by mouth daily with breakfast.     . metoprolol succinate (TOPROL-XL) 100 MG 24 hr tablet Take 1 tablet (100 mg total) by mouth daily.  30 tablet 6  . XARELTO 20 MG TABS tablet TAKE 1 TABLET (20 MG TOTAL) BY MOUTH DAILY WITH SUPPER. 30 tablet 5   No current facility-administered medications for this visit.    Allergies:   Shrimp [shellfish allergy], Banana, Watermelon flavor, Peanut-containing drug products, Almond oil, and Sulfa antibiotics   Social History:  The patient  reports that he has been smoking cigarettes. He has a 28.00 pack-year smoking history. He has quit using smokeless tobacco.  His smokeless tobacco use included snuff. He reports previous alcohol use of about 6.0 standard drinks of alcohol per week. He reports that he does not use drugs.   Family History:  The patient's family history  includes Diabetes in his mother and paternal grandfather; Heart attack (age of onset: 68) in his father; Stroke in his maternal grandmother and paternal grandmother.   ROS:  Please see the history of present illness.   All other systems are personally reviewed and negative.    Exam:    Vital Signs:  Ht 5\' 9"  (1.753 m)   Wt (!) 304 lb (137.9 kg)   BMI 44.89 kg/m   Well sounding, alert and conversant  Labs/Other Tests and Data Reviewed:    Recent Labs: 09/29/2019: B Natriuretic Peptide 222.2 09/30/2019: ALT 24; Magnesium 1.9; TSH 2.069 10/30/2019: BUN 26; Creatinine, Ser 1.81; Hemoglobin 13.7; Platelets 315; Potassium 5.7; Sodium 138   Wt Readings from Last 3 Encounters:  11/10/19 (!) 304 lb (137.9 kg)  10/30/19 (!) 301 lb 12.8 oz (136.9 kg)  10/29/19 (!) 304 lb (137.9 kg)     ASSESSMENT & PLAN:    1.  Paroxysmal atrial fibrillation and mid RP SVT I suspect either atach (most likely), atypical AVNRT, or AVRT as the cause of his SVT. He also has a h/o afib. He has failed medical therapy with flecainide and metoprolol.  Chads2vasc score is 2.  he is anticoagulated with xarelto . Therapeutic strategies for SVT and afib including medicine and ablation were discussed in detail with the patient today. Risk, benefits, and alternatives to EP study and radiofrequency ablation were also discussed in detail today. These risks include but are not limited to stroke, bleeding, vascular damage, tamponade, perforation, damage to the esophagus, lungs, and other structures, pulmonary vein stenosis, worsening renal function, and death. The patient understands these risk and wishes to proceed.  We will therefore proceed with catheter ablation at the next available time.  Carto, ICE, anesthesia are requested for the procedure.  Will also obtain cardiac CT prior to the procedure to exclude LAA thrombus and further evaluate atrial anatomy.  2. Morbid obesity Body mass index is 44.89 kg/m. Lifestyle  modification is encouraged  3. HTN Stable No change required today  4. Obstructive sleep apnea Compliance with CPAP is encouraged   Patient Risk:  after full review of this patients clinical status, I feel that they are at high risk at this time.  Today, I have spent 20 minutes with the patient with telehealth technology discussing arrhythmia management .    Army Fossa, MD  11/10/2019 9:30 AM     [facility] [address] [place]  [address] [phone] (office) [phone] (fax)

## 2019-11-10 NOTE — Telephone Encounter (Signed)
-----   Message from [name], MD sent at 11/10/2019  9:33 AM EST ----- Afib and SVT ablation  Carto/ICE/anesthesia  Cardiac CT

## 2019-11-10 NOTE — H&P (View-Only) (Signed)
Electrophysiology TeleHealth Note  Due to national recommendations of social distancing due to COVID 19, an audio telehealth visit is felt to be most appropriate for this patient at this time.  Verbal consent was obtained by me for the telehealth visit today.  The patient does not have capability for a virtual visit.  A phone visit is therefore required today.   Date:  11/10/2019   ID:  [name], DOB [birth year], MRN [id number]  Location: patient's home  Provider location:  [facility]  Evaluation Performed: Follow-up visit  PCP:  Renford Dills, MD   Electrophysiologist:  Dr Johney Frame  Chief Complaint:  palpitations  History of Present Illness:    [name] is a 49 y.o. male who presents via telehealth conferencing today.  Since last being seen in our clinic, the patient reports doing reasonably  Well.  He has had additional SVT.  ekg from 10/29/2019 reveals mid to late RP SVT.  He reports that this spontaneously terminated.  He finds that exercise and strenuous activity trigger his arrhythmia.  He has limited his activity.  Today, he denies symptoms of chest pain, shortness of breath,  lower extremity edema, dizziness, presyncope, or syncope. He notices that the right side of his mouth is "twisted" some times. The patient is otherwise without complaint today.  The patient denies symptoms of fevers, chills, cough, or new SOB worrisome for COVID 19.  Past Medical History:  Diagnosis Date  . Diabetes mellitus   . GERD (gastroesophageal reflux disease)   . History of alcohol abuse   . History of cocaine use   . History of echocardiogram    a. Echo 4/14: Moderate LVH, vigorous LVEF, EF 65-70%, normal wall motion, grade 2 diastolic dysfunction, mildly dilated aortic root and ascending aorta, ascending aorta 40 mm, aortic root 38 mm, mild LAE  . Hx of cardiovascular stress test    a. GXT 5/14: No ischemic changes  //  b. ETT-Myoview 3/16:  Low risk, no ischemia,  EF 58%  . Hyperlipidemia   . Hypertension   . Morbid obesity ([facility])   . Paroxysmal atrial fibrillation ([facility])    Occurring in 2008, with several recurrence since then (including in the setting of + cocaine on UDS).  . Sleep apnea    uses CPAP  . SVT (supraventricular tachycardia) ([facility])    mid to long RP SVT 09/2019    Past Surgical History:  Procedure Laterality Date  . APPENDECTOMY    . ROTATOR CUFF REPAIR    . TONSILLECTOMY      Current Outpatient Medications  Medication Sig Dispense Refill  . atorvastatin (LIPITOR) 10 MG tablet Take 10 mg by mouth daily.    . Blood Pressure Monitoring (BLOOD PRESSURE MONITOR/L CUFF) MISC 1 Units by Does not apply route daily. 1 each 0  . diltiazem (CARDIZEM) 30 MG tablet Take 1 Tablet Every 4 Hours As Needed For HR >100 45 tablet 2  . flecainide (TAMBOCOR) 100 MG tablet Take 1.5 tablets (150 mg total) by mouth 2 (two) times daily. 90 tablet 3  . glipiZIDE (GLUCOTROL) 10 MG tablet Take 10 mg by mouth 2 (two) times daily.    Marland Kitchen lisinopril (ZESTRIL) 40 MG tablet Take 40 mg by mouth daily.    . metFORMIN (GLUCOPHAGE) 500 MG tablet Take 1,000 mg by mouth daily with breakfast.     . metoprolol succinate (TOPROL-XL) 100 MG 24 hr tablet Take 1 tablet (100 mg total) by mouth daily.  30 tablet 6  . XARELTO 20 MG TABS tablet TAKE 1 TABLET (20 MG TOTAL) BY MOUTH DAILY WITH SUPPER. 30 tablet 5   No current facility-administered medications for this visit.    Allergies:   Shrimp [shellfish allergy], Banana, Watermelon flavor, Peanut-containing drug products, Almond oil, and Sulfa antibiotics   Social History:  The patient  reports that he has been smoking cigarettes. He has a 28.00 pack-year smoking history. He has quit using smokeless tobacco.  His smokeless tobacco use included snuff. He reports previous alcohol use of about 6.0 standard drinks of alcohol per week. He reports that he does not use drugs.   Family History:  The patient's family history  includes Diabetes in his mother and paternal grandfather; Heart attack (age of onset: 37) in his father; Stroke in his maternal grandmother and paternal grandmother.   ROS:  Please see the history of present illness.   All other systems are personally reviewed and negative.    Exam:    Vital Signs:  Ht 5' 9" (1.753 m)   Wt (!) 304 lb (137.9 kg)   BMI 44.89 kg/m   Well sounding, alert and conversant  Labs/Other Tests and Data Reviewed:    Recent Labs: 09/29/2019: B Natriuretic Peptide 222.2 09/30/2019: ALT 24; Magnesium 1.9; TSH 2.069 10/30/2019: BUN 26; Creatinine, Ser 1.81; Hemoglobin 13.7; Platelets 315; Potassium 5.7; Sodium 138   Wt Readings from Last 3 Encounters:  11/10/19 (!) 304 lb (137.9 kg)  10/30/19 (!) 301 lb 12.8 oz (136.9 kg)  10/29/19 (!) 304 lb (137.9 kg)     ASSESSMENT & PLAN:    1.  Paroxysmal atrial fibrillation and mid RP SVT I suspect either atach (most likely), atypical AVNRT, or AVRT as the cause of his SVT. He also has a h/o afib. He has failed medical therapy with flecainide and metoprolol.  Chads2vasc score is 2.  he is anticoagulated with xarelto . Therapeutic strategies for SVT and afib including medicine and ablation were discussed in detail with the patient today. Risk, benefits, and alternatives to EP study and radiofrequency ablation were also discussed in detail today. These risks include but are not limited to stroke, bleeding, vascular damage, tamponade, perforation, damage to the esophagus, lungs, and other structures, pulmonary vein stenosis, worsening renal function, and death. The patient understands these risk and wishes to proceed.  We will therefore proceed with catheter ablation at the next available time.  Carto, ICE, anesthesia are requested for the procedure.  Will also obtain cardiac CT prior to the procedure to exclude LAA thrombus and further evaluate atrial anatomy.  2. Morbid obesity Body mass index is 44.89 kg/m. Lifestyle  modification is encouraged  3. HTN Stable No change required today  4. Obstructive sleep apnea Compliance with CPAP is encouraged   Patient Risk:  after full review of this patients clinical status, I feel that they are at high risk at this time.  Today, I have spent 20 minutes with the patient with telehealth technology discussing arrhythmia management .    Signed, Demosthenes Virnig, MD  11/10/2019 9:30 AM     [facility] [address] [place] [address] [phone] (office) [phone] (fax)  

## 2019-11-11 ENCOUNTER — Ambulatory Visit: Payer: 59 | Admitting: Podiatry

## 2019-11-19 ENCOUNTER — Telehealth (HOSPITAL_COMMUNITY): Payer: Self-pay | Admitting: Emergency Medicine

## 2019-11-19 NOTE — Telephone Encounter (Signed)
Reaching out to patient to offer assistance regarding upcoming cardiac imaging study; pt verbalizes understanding of appt date/time, parking situation and where to check in, pre-test NPO status and medications ordered, and verified current allergies; name and call back number provided for further questions should they arise Kamyiah Colantonio RN Navigator Cardiac Imaging [facility] [phone] office [phone] cell 

## 2019-11-20 ENCOUNTER — Other Ambulatory Visit: Payer: Self-pay

## 2019-11-20 ENCOUNTER — Ambulatory Visit (HOSPITAL_COMMUNITY)
Admission: RE | Admit: 2019-11-20 | Discharge: 2019-11-20 | Disposition: A | Discharge status: Home / Self Care | Payer: 59 | Source: Ambulatory Visit | Attending: Internal Medicine | Admitting: Internal Medicine

## 2019-11-20 DIAGNOSIS — I4891 Unspecified atrial fibrillation: Secondary | ICD-10-CM | POA: Diagnosis not present

## 2019-11-20 IMAGING — CT CT HEART MORPH/PULM VEIN W/ CM & W/O CA SCORE
2 of 9 series · 6 of 20 positions shown, 7 images · IV contrast (APPLIED)
Comparison: None.
COMPARISON: None.

Addendum:
EXAM:
OVER-READ INTERPRETATION  CT CHEST

The following report is an over-read performed by radiologist Dr.
over-read does not include interpretation of cardiac or coronary
anatomy or pathology. The cardiac CTA interpretation by the
cardiologist is attached.
CLINICAL DATA: Atrial fibrillation scheduled for an ablation.
Cardiac CT/CTA
TECHNIQUE: The patient was scanned on a Siemens Force [REDACTED]ice  scanner.

[Series 11: best diast · axial · 0.47mm/px · z∈[-28,+41]mm · 3 of 350 slices shown, 4 images]
[im 88/350  vessel]
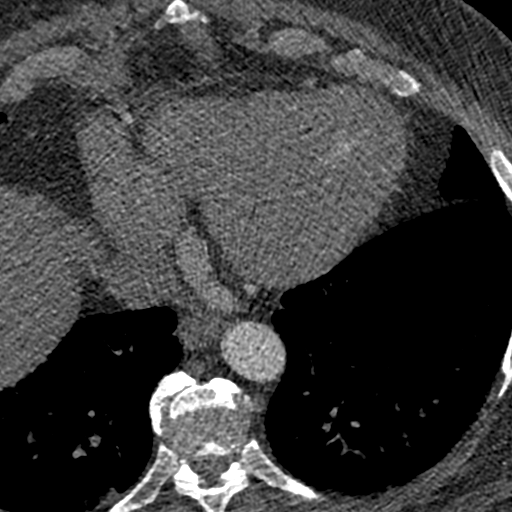
[im 88/350  lung]
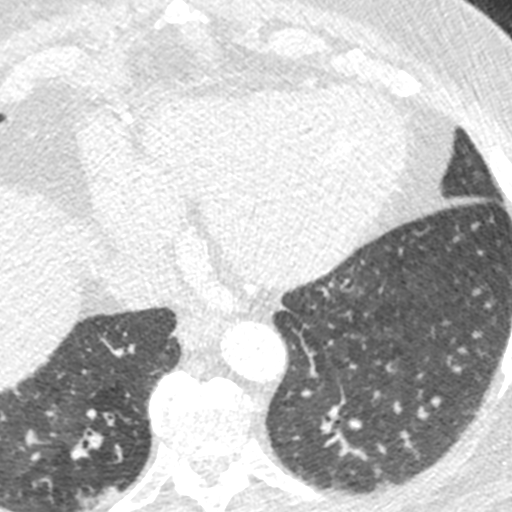
[im 175/350  vessel]
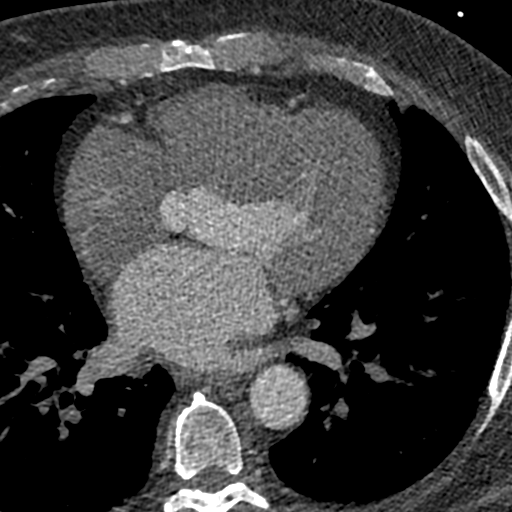
[im 262/350  vessel]
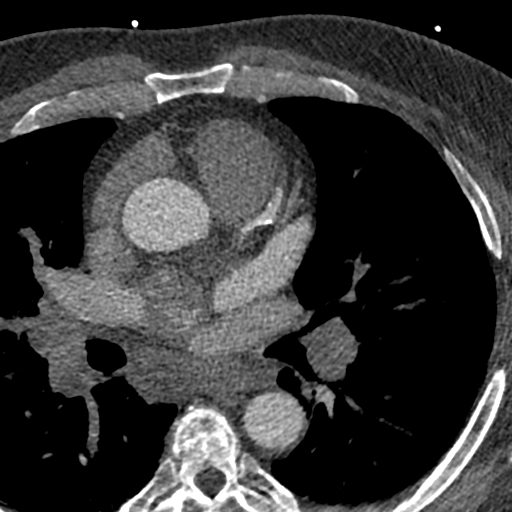

[Series 12: +300 ms · axial · 0.47mm/px · z∈[-28,+41]mm · 3 of 350 slices shown]
[im 88/350  vessel]
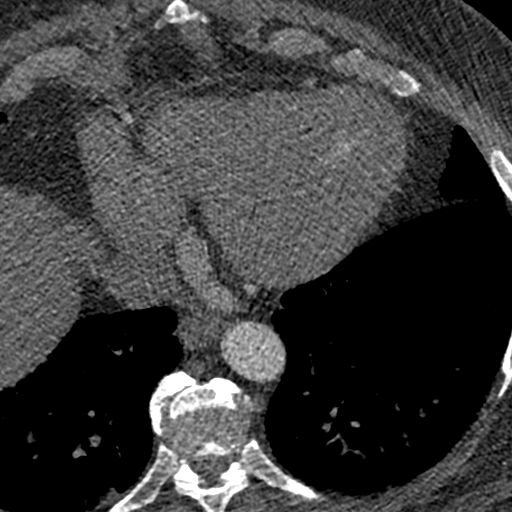
[im 175/350  vessel]
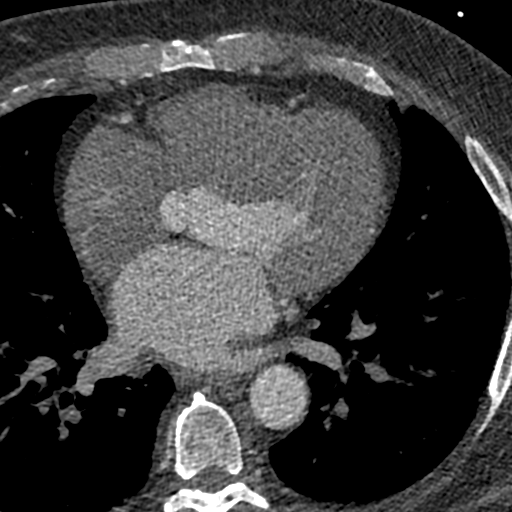
[im 262/350  vessel]
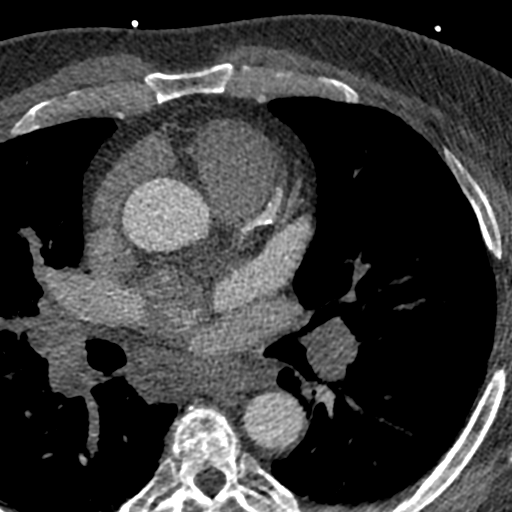

[6 of 20 positions shown; findings below may reference images not displayed]

FINDINGS: Vascular: No incidental vascular findings.

Mediastinum/Nodes: Multiple enlarged lymph nodes identified with a
2.3 cm precarinal lymph node, 3 cm subcarinal node, multiple
right-sided hilar lymph nodes including a 1.9 cm superior right
hilar node, 1.6 cm AP window node and 1.3 cm lymph node to the left
of the proximal left pulmonary artery. Enlarged 1.6 cm left hilar
lymph node.

Lungs/Pleura: Visualized lungs show no evidence of pulmonary edema,
consolidation, pneumothorax, nodule or pleural fluid.

Upper Abdomen: No acute abnormality.

Musculoskeletal: No chest wall mass or suspicious bone lesions
identified.
IMPRESSION: Note is made of significant mediastinal and bilateral hilar
lymphadenopathy. Differential considerations include
lymphoproliferative process such as lymphoma or underlying chronic
inflammatory process such as sarcoidosis. Recommend further
evaluation with a full CT of the chest.
FINDINGS: A 120 kV prospective scan was triggered in the ascending thoracic
aorta at 140 HU's. Gantry rotation speed was 250 msecs and
collimation was .6 mm. No beta blockade and no NTG was given. The 3D
data set was reconstructed for best systolic and diastolic phases
along with delayed images of the JOONSUB Images analyzed on a dedicated
work station using MPR, MIP and VRT modes. The patient received 80
cc of contrast.

Moderate bi atrial enlargement. No JOONSUB thrombus. No ASD/PFO No
pericardial effusion. Normal aortic root. Normal PV anataomy
measurements below

RUPV: Ostium 21.9 mm  area 3.56 cm2

RLPV:  Ostium 20.2 mm  area 3.6 cm2

LUPV:  Ostium 21.7 mm area 3.8 cm2

LLPV:  Ostium 18.6 mm  area 2.46 cm2

Calcium score 973 significant 3 vessel coronary calcium worse in the
LAD
IMPRESSION: 1.  Moderate bi atrial enlargement with no JOONSUB thrombus

2.  Normal PV anatomy see measurements above

3.  Normal aortic root 3.6 cm

4.  No pericardial effusion

5. Calcium score 973 which is 99th percentile for age/sex Unable to
evaluate coronary arteries by CTA due to lack of nitroglycerin and
poor opacification due to patients body habitus Suggest f/u
perfusion study

6.  No ASD/PFO

JOONSUB

*** End of Addendum ***
EXAM:
OVER-READ INTERPRETATION  CT CHEST

The following report is an over-read performed by radiologist Dr.
over-read does not include interpretation of cardiac or coronary
anatomy or pathology. The cardiac CTA interpretation by the
cardiologist is attached.
FINDINGS: Vascular: No incidental vascular findings.

Mediastinum/Nodes: Multiple enlarged lymph nodes identified with a
2.3 cm precarinal lymph node, 3 cm subcarinal node, multiple
right-sided hilar lymph nodes including a 1.9 cm superior right
hilar node, 1.6 cm AP window node and 1.3 cm lymph node to the left
of the proximal left pulmonary artery. Enlarged 1.6 cm left hilar
lymph node.

Lungs/Pleura: Visualized lungs show no evidence of pulmonary edema,
consolidation, pneumothorax, nodule or pleural fluid.

Upper Abdomen: No acute abnormality.

Musculoskeletal: No chest wall mass or suspicious bone lesions
identified.
IMPRESSION: Note is made of significant mediastinal and bilateral hilar
lymphadenopathy. Differential considerations include
lymphoproliferative process such as lymphoma or underlying chronic
inflammatory process such as sarcoidosis. Recommend further
evaluation with a full CT of the chest.

## 2019-11-20 MED ORDER — IOHEXOL 350 MG/ML SOLN
80.0000 mL | Freq: Once | INTRAVENOUS | Status: AC | PRN
  Administered 2019-11-20: 15:00:00 80 mL via INTRAVENOUS

## 2019-11-24 ENCOUNTER — Telehealth: Payer: Self-pay | Admitting: Internal Medicine

## 2019-11-24 ENCOUNTER — Other Ambulatory Visit (HOSPITAL_COMMUNITY)
Admission: RE | Admit: 2019-11-24 | Discharge: 2019-11-24 | Disposition: A | Discharge status: Home / Self Care | Payer: 59 | Source: Ambulatory Visit | Attending: Internal Medicine | Admitting: Internal Medicine

## 2019-11-24 DIAGNOSIS — Z01812 Encounter for preprocedural laboratory examination: Secondary | ICD-10-CM | POA: Diagnosis not present

## 2019-11-24 DIAGNOSIS — Z20822 Contact with and (suspected) exposure to covid-19: Secondary | ICD-10-CM | POA: Insufficient documentation

## 2019-11-24 LAB — SARS CORONAVIRUS 2 (TAT 6-24 HRS): SARS Coronavirus 2: NEGATIVE

## 2019-11-24 NOTE — Telephone Encounter (Signed)
New Message   Patient wants to know how long procedure on 11/27/2019 will take and if his sister can come into the hospital and wait? Please advise.

## 2019-11-25 NOTE — Telephone Encounter (Signed)
Returned call to Pt.  Advised of possible length of procedure.  Advised sister wait at home during procedure.  All questions answered.

## 2019-11-26 NOTE — Progress Notes (Signed)
Instructed patient on the following items: Arrival time 0530 Nothing to eat or drink after midnight No meds AM of procedure Responsible person to drive you home and stay with you for 24 hrs  Have you missed any doses of anti-coagulant- No   

## 2019-11-26 NOTE — Anesthesia Preprocedure Evaluation (Addendum)
Anesthesia Evaluation  Patient identified by MRN, date of birth, ID band Patient awake    Reviewed: Allergy & Precautions, NPO status , Patient's Chart, lab work & pertinent test results  Airway Mallampati: II  TM Distance: >3 FB     Dental   Pulmonary Current Smoker,    breath sounds clear to auscultation       Cardiovascular hypertension,  Rhythm:Regular Rate:Normal     Neuro/Psych    GI/Hepatic GERD  ,  Endo/Other  diabetes  Renal/GU      Musculoskeletal   Abdominal   Peds  Hematology   Anesthesia Other Findings   Reproductive/Obstetrics                            Anesthesia Physical Anesthesia Plan  ASA: III  Anesthesia Plan: General   Post-op Pain Management:    Induction: Intravenous  PONV Risk Score and Plan: 2 and Ondansetron, Dexamethasone and Midazolam  Airway Management Planned: Oral ETT  Additional Equipment:   Intra-op Plan:   Post-operative Plan: Extubation in OR  Informed Consent:   Plan Discussed with: Anesthesiologist and CRNA  Anesthesia Plan Comments:        Anesthesia Quick Evaluation

## 2019-11-27 ENCOUNTER — Encounter (HOSPITAL_COMMUNITY)
Admission: RE | Disposition: A | Discharge status: Home / Self Care | Payer: Self-pay | Source: Home / Self Care | Attending: Internal Medicine

## 2019-11-27 ENCOUNTER — Ambulatory Visit (HOSPITAL_COMMUNITY): Payer: 59 | Admitting: Certified Registered Nurse Anesthetist

## 2019-11-27 ENCOUNTER — Telehealth: Payer: Self-pay

## 2019-11-27 ENCOUNTER — Other Ambulatory Visit: Payer: Self-pay

## 2019-11-27 ENCOUNTER — Ambulatory Visit (HOSPITAL_COMMUNITY)
Admission: RE | Admit: 2019-11-27 | Discharge: 2019-11-27 | Disposition: A | Discharge status: Home / Self Care | Payer: 59 | Attending: Internal Medicine | Admitting: Internal Medicine

## 2019-11-27 DIAGNOSIS — Z8249 Family history of ischemic heart disease and other diseases of the circulatory system: Secondary | ICD-10-CM | POA: Insufficient documentation

## 2019-11-27 DIAGNOSIS — I471 Supraventricular tachycardia: Secondary | ICD-10-CM | POA: Insufficient documentation

## 2019-11-27 DIAGNOSIS — Z7901 Long term (current) use of anticoagulants: Secondary | ICD-10-CM | POA: Insufficient documentation

## 2019-11-27 DIAGNOSIS — E119 Type 2 diabetes mellitus without complications: Secondary | ICD-10-CM | POA: Diagnosis not present

## 2019-11-27 DIAGNOSIS — K219 Gastro-esophageal reflux disease without esophagitis: Secondary | ICD-10-CM | POA: Diagnosis not present

## 2019-11-27 DIAGNOSIS — Z79899 Other long term (current) drug therapy: Secondary | ICD-10-CM | POA: Diagnosis not present

## 2019-11-27 DIAGNOSIS — Z833 Family history of diabetes mellitus: Secondary | ICD-10-CM | POA: Insufficient documentation

## 2019-11-27 DIAGNOSIS — I10 Essential (primary) hypertension: Secondary | ICD-10-CM | POA: Insufficient documentation

## 2019-11-27 DIAGNOSIS — I480 Paroxysmal atrial fibrillation: Secondary | ICD-10-CM

## 2019-11-27 DIAGNOSIS — E785 Hyperlipidemia, unspecified: Secondary | ICD-10-CM | POA: Diagnosis not present

## 2019-11-27 DIAGNOSIS — Z87891 Personal history of nicotine dependence: Secondary | ICD-10-CM | POA: Diagnosis not present

## 2019-11-27 DIAGNOSIS — Z7984 Long term (current) use of oral hypoglycemic drugs: Secondary | ICD-10-CM | POA: Diagnosis not present

## 2019-11-27 DIAGNOSIS — G4733 Obstructive sleep apnea (adult) (pediatric): Secondary | ICD-10-CM | POA: Insufficient documentation

## 2019-11-27 DIAGNOSIS — Z6841 Body Mass Index (BMI) 40.0 and over, adult: Secondary | ICD-10-CM | POA: Insufficient documentation

## 2019-11-27 DIAGNOSIS — Z882 Allergy status to sulfonamides status: Secondary | ICD-10-CM | POA: Insufficient documentation

## 2019-11-27 HISTORY — PX: ATRIAL FIBRILLATION ABLATION: EP1191

## 2019-11-27 HISTORY — PX: SVT ABLATION: EP1225

## 2019-11-27 LAB — BASIC METABOLIC PANEL
Anion gap: 8 (ref 5–15)
BUN: 22 mg/dL — ABNORMAL HIGH (ref 6–20)
CO2: 22 mmol/L (ref 22–32)
Calcium: 8.8 mg/dL — ABNORMAL LOW (ref 8.9–10.3)
Chloride: 105 mmol/L (ref 98–111)
Creatinine, Ser: 1.12 mg/dL (ref 0.61–1.24)
GFR calc Af Amer: >60 mL/min (ref >60)
GFR calc non Af Amer: >60 mL/min (ref >60)
Glucose, Bld: 204 mg/dL — ABNORMAL HIGH (ref 70–99)
Potassium: 5.2 mmol/L — ABNORMAL HIGH (ref 3.5–5.1)
Sodium: 135 mmol/L (ref 135–145)

## 2019-11-27 LAB — GLUCOSE, CAPILLARY
Glucose-Capillary: 190 mg/dL — ABNORMAL HIGH (ref 70–99)
Glucose-Capillary: 201 mg/dL — ABNORMAL HIGH (ref 70–99)

## 2019-11-27 LAB — CBC
HCT: 39.5 % (ref 39.0–52.0)
Hemoglobin: 12.9 g/dL — ABNORMAL LOW (ref 13.0–17.0)
MCH: 27.9 pg (ref 26.0–34.0)
MCHC: 32.7 g/dL (ref 30.0–36.0)
MCV: 85.3 fL (ref 80.0–100.0)
Platelets: 289 10*3/uL (ref 150–400)
RBC: 4.63 MIL/uL (ref 4.22–5.81)
RDW: 13.6 % (ref 11.5–15.5)
WBC: 8 10*3/uL (ref 4.0–10.5)
nRBC: 0 % (ref 0.0–0.2)

## 2019-11-27 LAB — POCT ACTIVATED CLOTTING TIME
Activated Clotting Time: 230 seconds
Activated Clotting Time: 219 seconds

## 2019-11-27 SURGERY — ATRIAL FIBRILLATION ABLATION
Anesthesia: General

## 2019-11-27 MED ORDER — HEPARIN SODIUM (PORCINE) 1000 UNIT/ML IJ SOLN
INTRAMUSCULAR | Status: DC | PRN
  Administered 2019-11-27: 1000 [IU] via INTRAVENOUS
  Administered 2019-11-27: 14000 [IU] via INTRAVENOUS

## 2019-11-27 MED ORDER — ISOPROTERENOL HCL 0.2 MG/ML IJ SOLN
INTRAVENOUS | Status: DC | PRN
  Administered 2019-11-27: 4 ug/min via INTRAVENOUS

## 2019-11-27 MED ORDER — ONDANSETRON HCL 4 MG/2ML IJ SOLN: 4.0000 mg | Freq: Four times a day (QID) | INTRAMUSCULAR | Status: DC | PRN

## 2019-11-27 MED ORDER — HEPARIN SODIUM (PORCINE) 1000 UNIT/ML IJ SOLN
INTRAMUSCULAR | Status: DC | PRN
  Administered 2019-11-27 (×2): 5000 [IU] via INTRAVENOUS

## 2019-11-27 MED ORDER — HEPARIN SODIUM (PORCINE) 1000 UNIT/ML IJ SOLN
INTRAMUSCULAR | Status: AC
  Filled 2019-11-27: qty 1

## 2019-11-27 MED ORDER — HEPARIN (PORCINE) IN NACL 1000-0.9 UT/500ML-% IV SOLN
INTRAVENOUS | Status: AC
  Filled 2019-11-27: qty 500

## 2019-11-27 MED ORDER — SODIUM CHLORIDE 0.9 % IV SOLN: 250.0000 mL | INTRAVENOUS | Status: DC | PRN

## 2019-11-27 MED ORDER — ACETAMINOPHEN 325 MG PO TABS: 650.0000 mg | ORAL_TABLET | ORAL | Status: DC | PRN

## 2019-11-27 MED ORDER — ISOPROTERENOL HCL 0.2 MG/ML IJ SOLN
INTRAMUSCULAR | Status: AC
  Filled 2019-11-27: qty 5

## 2019-11-27 MED ORDER — PANTOPRAZOLE SODIUM 40 MG PO TBEC: 40.0000 mg | DELAYED_RELEASE_TABLET | Freq: Every day | ORAL | 0 refills | Status: DC

## 2019-11-27 MED ORDER — SODIUM CHLORIDE 0.9% FLUSH: 3.0000 mL | INTRAVENOUS | Status: DC | PRN

## 2019-11-27 MED ORDER — PROPOFOL 10 MG/ML IV BOLUS
INTRAVENOUS | Status: DC | PRN
  Administered 2019-11-27: 200 mg via INTRAVENOUS

## 2019-11-27 MED ORDER — HEPARIN (PORCINE) IN NACL 1000-0.9 UT/500ML-% IV SOLN
INTRAVENOUS | Status: DC | PRN
  Administered 2019-11-27: 500 mL

## 2019-11-27 MED ORDER — SUCCINYLCHOLINE CHLORIDE 20 MG/ML IJ SOLN
INTRAMUSCULAR | Status: DC | PRN
  Administered 2019-11-27: 160 mg via INTRAVENOUS

## 2019-11-27 MED ORDER — SODIUM CHLORIDE 0.9% FLUSH: 3.0000 mL | Freq: Two times a day (BID) | INTRAVENOUS | Status: DC

## 2019-11-27 MED ORDER — ROCURONIUM BROMIDE 50 MG/5ML IV SOSY
PREFILLED_SYRINGE | INTRAVENOUS | Status: DC | PRN
  Administered 2019-11-27: 40 mg via INTRAVENOUS
  Administered 2019-11-27: 30 mg via INTRAVENOUS
  Administered 2019-11-27: 25 mg via INTRAVENOUS
  Administered 2019-11-27: 15 mg via INTRAVENOUS

## 2019-11-27 MED ORDER — SUGAMMADEX SODIUM 200 MG/2ML IV SOLN
INTRAVENOUS | Status: DC | PRN
  Administered 2019-11-27: 400 mg via INTRAVENOUS

## 2019-11-27 MED ORDER — ONDANSETRON HCL 4 MG/2ML IJ SOLN
INTRAMUSCULAR | Status: DC | PRN
  Administered 2019-11-27: 4 mg via INTRAVENOUS

## 2019-11-27 MED ORDER — RIVAROXABAN 20 MG PO TABS
20.0000 mg | ORAL_TABLET | Freq: Once | ORAL | Status: AC
  Administered 2019-11-27: 20 mg via ORAL
  Filled 2019-11-27: qty 1

## 2019-11-27 MED ORDER — FENTANYL CITRATE (PF) 100 MCG/2ML IJ SOLN
INTRAMUSCULAR | Status: DC | PRN
  Administered 2019-11-27 (×2): 50 ug via INTRAVENOUS

## 2019-11-27 MED ORDER — LIDOCAINE 2% (20 MG/ML) 5 ML SYRINGE
INTRAMUSCULAR | Status: DC | PRN
  Administered 2019-11-27: 100 mg via INTRAVENOUS

## 2019-11-27 MED ORDER — PHENYLEPHRINE HCL-NACL 10-0.9 MG/250ML-% IV SOLN
INTRAVENOUS | Status: DC | PRN
  Administered 2019-11-27: 25 ug/min via INTRAVENOUS

## 2019-11-27 MED ORDER — PROTAMINE SULFATE 10 MG/ML IV SOLN
INTRAVENOUS | Status: DC | PRN
  Administered 2019-11-27: 40 mg via INTRAVENOUS

## 2019-11-27 MED ORDER — PHENYLEPHRINE HCL (PRESSORS) 10 MG/ML IV SOLN
INTRAVENOUS | Status: DC | PRN
  Administered 2019-11-27: 80 ug via INTRAVENOUS

## 2019-11-27 MED ORDER — SODIUM CHLORIDE 0.9 % IV SOLN: INTRAVENOUS | Status: DC

## 2019-11-27 MED ORDER — MIDAZOLAM HCL 5 MG/5ML IJ SOLN
INTRAMUSCULAR | Status: DC | PRN
  Administered 2019-11-27: 2 mg via INTRAVENOUS

## 2019-11-27 MED ORDER — METOPROLOL SUCCINATE ER 100 MG PO TB24
100.0000 mg | ORAL_TABLET | ORAL | Status: AC
  Administered 2019-11-27: 16:00:00 100 mg via ORAL
  Filled 2019-11-27: qty 1

## 2019-11-27 MED ORDER — HYDROCODONE-ACETAMINOPHEN 5-325 MG PO TABS: 1.0000 | ORAL_TABLET | ORAL | Status: DC | PRN

## 2019-11-27 SURGICAL SUPPLY — 22 items
BLANKET WARM UNDERBOD FULL ACC (MISCELLANEOUS) ×2 IMPLANT
CATH 8FR REPROCESSED SOUNDSTAR (CATHETERS) ×2 IMPLANT
CATH 8FR SOUNDSTAR REPROCESSED (CATHETERS) IMPLANT
CATH MAPPNG PENTARAY F 2-6-2MM (CATHETERS) IMPLANT
CATH SMTCH THERMOCOOL SF DF (CATHETERS) ×1 IMPLANT
CATH WEBSTER BI DIR CS D-F CRV (CATHETERS) ×1 IMPLANT
COVER SWIFTLINK CONNECTOR (BAG) ×2 IMPLANT
DEVICE CLOSURE PERCLS PRGLD 6F (VASCULAR PRODUCTS) IMPLANT
NDL BAYLIS TRANSSEPTAL 71CM (NEEDLE) IMPLANT
NEEDLE BAYLIS TRANSSEPTAL 71CM (NEEDLE) ×2 IMPLANT
PACK EP LATEX FREE (CUSTOM PROCEDURE TRAY) ×1
PACK EP LF (CUSTOM PROCEDURE TRAY) ×1 IMPLANT
PAD PRO RADIOLUCENT 2001M-C (PAD) ×2 IMPLANT
PATCH CARTO3 (PAD) ×1 IMPLANT
PENTARAY F 2-6-2MM (CATHETERS) ×4
PERCLOSE PROGLIDE 6F (VASCULAR PRODUCTS) ×6
SHEATH PINNACLE 7F 10CM (SHEATH) ×2 IMPLANT
SHEATH PINNACLE 9F 10CM (SHEATH) ×1 IMPLANT
SHEATH PROBE COVER 6X72 (BAG) ×2 IMPLANT
SHEATH SWARTZ TS SL2 63CM 8.5F (SHEATH) ×1 IMPLANT
SHIELD RADPAD SCOOP 12X17 (MISCELLANEOUS) ×2 IMPLANT
TUBING SMART ABLATE COOLFLOW (TUBING) ×1 IMPLANT

## 2019-11-27 NOTE — Anesthesia Postprocedure Evaluation (Signed)
Anesthesia Post Note  Patient: [name]  Procedure(s) Performed: ATRIAL FIBRILLATION ABLATION (N/A ) SVT ABLATION (N/A )     Patient location during evaluation: PACU Anesthesia Type: General Level of consciousness: awake Pain management: pain level controlled Respiratory status: spontaneous breathing Cardiovascular status: stable Postop Assessment: no apparent nausea or vomiting Anesthetic complications: no    Last Vitals:  Vitals:   11/27/19 1117 11/27/19 1126  BP: (!) 199/96 (!) 194/96  Pulse: 69 69  Resp: 19 20  Temp:    SpO2: 97% 97%    Last Pain:  Vitals:   11/27/19 1208  TempSrc:   PainSc: 0-No pain                 Naelle Diegel

## 2019-11-27 NOTE — Telephone Encounter (Signed)
Called and spoke to patient. He states that he is taking his atorvastatin 10 mg QD and has not missed any doses. Instructed patient to continue taking it to maintain heart healthy diet, get regular exercise and work on smoking cessation. Patient will keep appt on 3/9 with Dr. Eldridge Dace.

## 2019-11-27 NOTE — Telephone Encounter (Signed)
-----   Message from Corky Crafts, MD sent at 11/27/2019 12:33 PM EST ----- Regarding: FW: cardiac CT He should restart his atorvastatin 10 mg daily.   JV ----- Message ----- From: Marily Lente, NP Sent: 11/27/2019   8:55 AM EST To: [name], MD, Corky Crafts, MD Subject: cardiac CT                                     Dr Eldridge Dace,  His pre AF ablation CT showed coronary calcifications.  I went ahead and moved up his follow up with you.  No recent ischemic symptoms.  He would need to stay on uninterrupted anticoagulation for 3 months following ablation if you wanted to do cath at some point.  Hospital doctor

## 2019-11-27 NOTE — Discharge Instructions (Signed)
Cardiac Ablation, Care After This sheet gives you information about how to care for yourself after your procedure. Your health care provider may also give you more specific instructions. If you have problems or questions, contact your health care provider. What can I expect after the procedure? After the procedure, it is common to have:  Bruising around your puncture site.  Tenderness around your puncture site.  Skipped heartbeats.  Tiredness (fatigue). Follow these instructions at home: Puncture site care   Follow instructions from your health care provider about how to take care of your puncture site. Make sure you: ? Wash your hands with soap and water before you change your bandage (dressing). If soap and water are not available, use hand sanitizer. ? Change your dressing as told by your health care provider. ? Leave stitches (sutures), skin glue, or adhesive strips in place. These skin closures may need to stay in place for up to 2 weeks. If adhesive strip edges start to loosen and curl up, you may trim the loose edges. Do not remove adhesive strips completely unless your health care provider tells you to do that.  Check your puncture site every day for signs of infection. Check for: ? Redness, swelling, or pain. ? Fluid or blood. If your puncture site starts to bleed, lie down on your back, apply firm pressure to the area, and contact your health care provider. ? Warmth. ? Pus or a bad smell. Driving  Ask your health care provider when it is safe for you to drive again after the procedure.  Do not drive or use heavy machinery while taking prescription pain medicine.  Do not drive for 24 hours if you were given a medicine to help you relax (sedative) during your procedure. Activity  Avoid activities that take a lot of effort for at least 3 days after your procedure.  Do not lift anything that is heavier than 10 lb (4.5 kg), or the limit that you are told, until your health  care provider says that it is safe.  Return to your normal activities as told by your health care provider. Ask your health care provider what activities are safe for you. General instructions  Take over-the-counter and prescription medicines only as told by your health care provider.  Do not use any products that contain nicotine or tobacco, such as cigarettes and e-cigarettes. If you need help quitting, ask your health care provider.  Do not take baths, swim, or use a hot tub until your health care provider approves.  Do not drink alcohol for 24 hours after your procedure.  Keep all follow-up visits as told by your health care provider. This is important. Contact a health care provider if:  You have redness, mild swelling, or pain around your puncture site.  You have fluid or blood coming from your puncture site that stops after applying firm pressure to the area.  Your puncture site feels warm to the touch.  You have pus or a bad smell coming from your puncture site.  You have a fever.  You have chest pain or discomfort that spreads to your neck, jaw, or arm.  You are sweating a lot.  You feel nauseous.  You have a fast or irregular heartbeat.  You have shortness of breath.  You are dizzy or light-headed and feel the need to lie down.  You have pain or numbness in the arm or leg closest to your puncture site. Get help right away if:  Your puncture   site suddenly swells.  Your puncture site is bleeding and the bleeding does not stop after applying firm pressure to the area. These symptoms may represent a serious problem that is an emergency. Do not wait to see if the symptoms will go away. Get medical help right away. Call your local emergency services (911 in the [place] Do not drive yourself to the hospital. Summary  After the procedure, it is normal to have bruising and tenderness at the puncture site in your groin, neck, or forearm.  Check your puncture site every  day for signs of infection.  Get help right away if your puncture site is bleeding and the bleeding does not stop after applying firm pressure to the area. This is a medical emergency. This information is not intended to replace advice given to you by your health care provider. Make sure you discuss any questions you have with your health care provider. Document Revised: 09/14/2017 Document Reviewed: 01/11/2017 Elsevier Patient Education  2020 [employer]  

## 2019-11-27 NOTE — Anesthesia Procedure Notes (Signed)
Procedure Name: Intubation Date/Time: 11/27/2019 7:51 AM Performed by: Inda Coke, CRNA Pre-anesthesia Checklist: Patient identified, Emergency Drugs available, Suction available and Patient being monitored Patient Re-evaluated:Patient Re-evaluated prior to induction Oxygen Delivery Method: Circle System Utilized Preoxygenation: Pre-oxygenation with 100% oxygen Induction Type: IV induction and Rapid sequence Laryngoscope Size: Mac and 4 Grade View: Grade II Tube type: Oral Tube size: 7.5 mm Number of attempts: 1 Airway Equipment and Method: Stylet and Oral airway Placement Confirmation: ETT inserted through vocal cords under direct vision,  positive ETCO2 and breath sounds checked- equal and bilateral Secured at: 22 cm Tube secured with: Tape Dental Injury: Teeth and Oropharynx as per pre-operative assessment

## 2019-11-27 NOTE — Interval H&P Note (Signed)
History and Physical Interval Note:  11/27/2019 7:23 AM  [name]  has presented today for surgery, with the diagnosis of afib - svt.  The various methods of treatment have been discussed with the patient and family. After consideration of risks, benefits and other options for treatment, the patient has consented to  Procedure(s): ATRIAL FIBRILLATION ABLATION (N/A) SVT ABLATION (N/A) as a surgical intervention.  The patient's history has been reviewed, patient examined, no change in status, stable for surgery.  I have reviewed the patient's chart and labs.  Questions were answered to the patient's satisfaction.    I have discussed cardiac CT findings with the patient today.  He has no ischemic symptoms to warrant acute evaluation of coronary calcification but will need long term follow-up.  Will also need additional studies to evaluate his mediastinal LAD. Reports compliance with xarelto without interruption.   [name]

## 2019-11-27 NOTE — Transfer of Care (Signed)
Immediate Anesthesia Transfer of Care Note  Patient: [name]  Procedure(s) Performed: ATRIAL FIBRILLATION ABLATION (N/A ) SVT ABLATION (N/A )  Patient Location: PACU  Anesthesia Type:General  Level of Consciousness: awake and alert   Airway & Oxygen Therapy: Patient Spontanous Breathing and Patient connected to nasal cannula oxygen  Post-op Assessment: Report given to RN and Post -op Vital signs reviewed and stable  Post vital signs: Reviewed and stable  Last Vitals:  Vitals Value Taken Time  BP    Temp    Pulse    Resp    SpO2      Last Pain:  Vitals:   11/27/19 0557  TempSrc:   PainSc: 0-No pain         Complications: No apparent anesthesia complications

## 2019-11-27 NOTE — Progress Notes (Signed)
Md made aware of Blood Glucose. No additional orders given.

## 2019-11-28 ENCOUNTER — Other Ambulatory Visit: Payer: Self-pay | Admitting: Nurse Practitioner

## 2019-11-28 DIAGNOSIS — R591 Generalized enlarged lymph nodes: Secondary | ICD-10-CM

## 2019-12-05 ENCOUNTER — Telehealth: Payer: Self-pay

## 2019-12-05 ENCOUNTER — Ambulatory Visit (HOSPITAL_COMMUNITY)
Admission: RE | Admit: 2019-12-05 | Discharge: 2019-12-05 | Disposition: A | Discharge status: Home / Self Care | Payer: 59 | Source: Ambulatory Visit | Attending: Nurse Practitioner | Admitting: Nurse Practitioner

## 2019-12-05 ENCOUNTER — Other Ambulatory Visit: Payer: Self-pay

## 2019-12-05 DIAGNOSIS — R591 Generalized enlarged lymph nodes: Secondary | ICD-10-CM | POA: Insufficient documentation

## 2019-12-05 IMAGING — CT CT CHEST W/O CM
2 of 5 series · 15 of 36 positions shown, 18 images · non-contrast
Comparison: Cardiac CT/CTA dated [DATE].

CLINICAL DATA: Mediastinal and bilateral hilar adenopathy on a
recent cardiac CT/CTA. A full chest CT was recommended.

EXAM:
CT CHEST WITHOUT CONTRAST
TECHNIQUE: Multidetector CT imaging of the chest was performed following the
standard protocol without IV contrast.

[Series 4: thorax 2.0 · axial · 0.80mm/px · z∈[+1134,+1422]mm · 12 of 162 slices shown, 15 images]
[im 9/162  mediastinal]
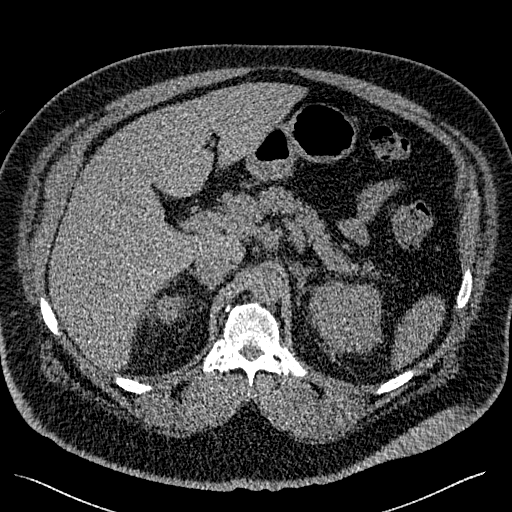
[im 9/162  lung]
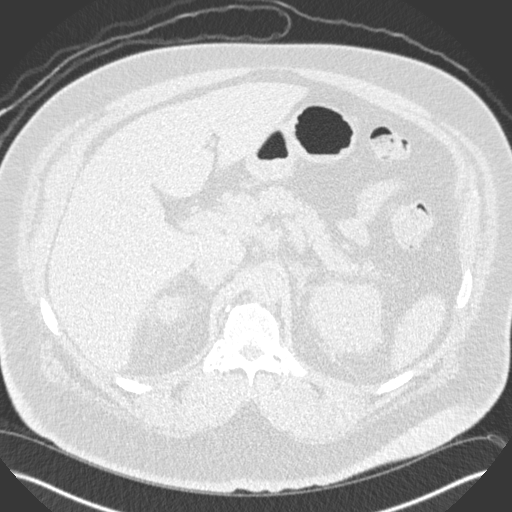
[im 26/162  lung]
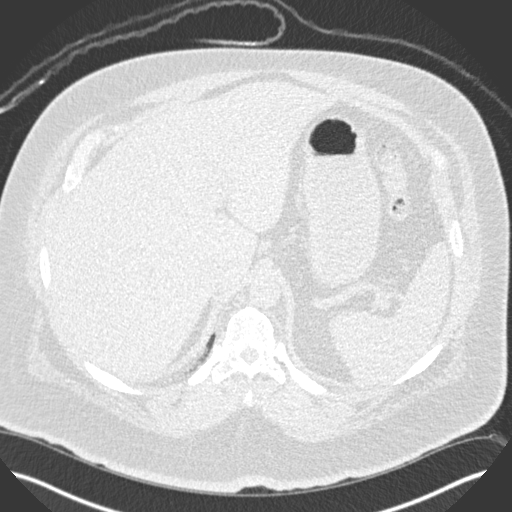
[im 34/162  lung]
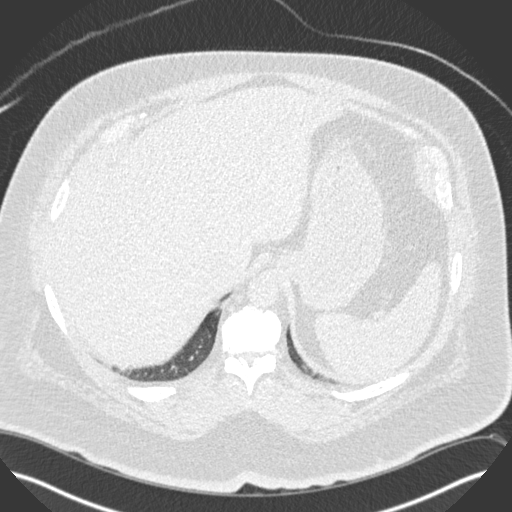
[im 51/162  lung]
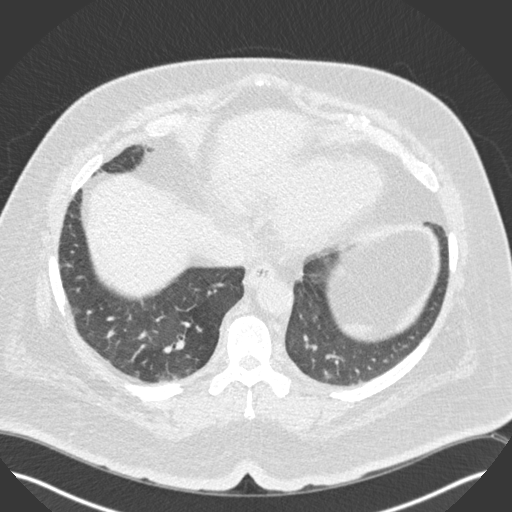
[im 60/162  mediastinal]
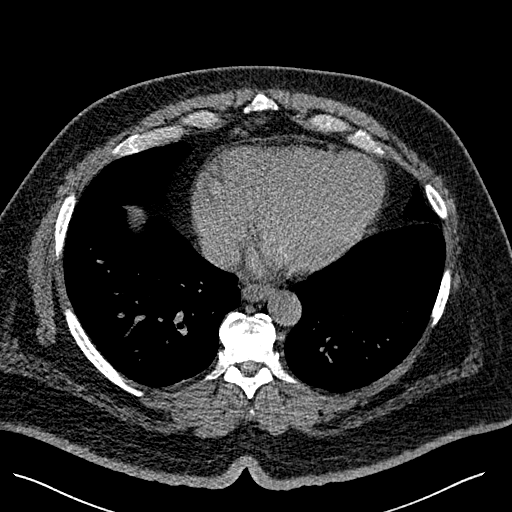
[im 60/162  lung]
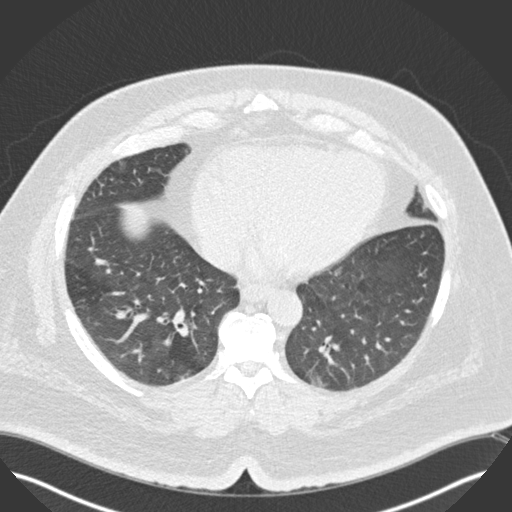
[im 77/162  lung]
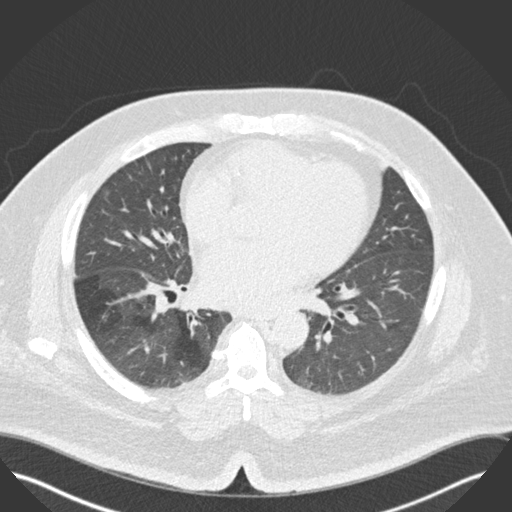
[im 85/162  lung]
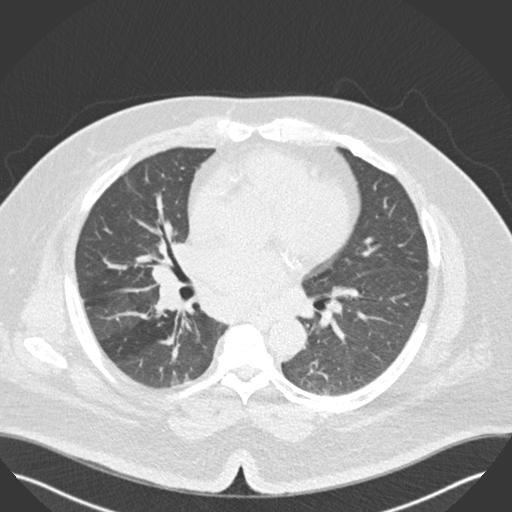
[im 102/162  lung]
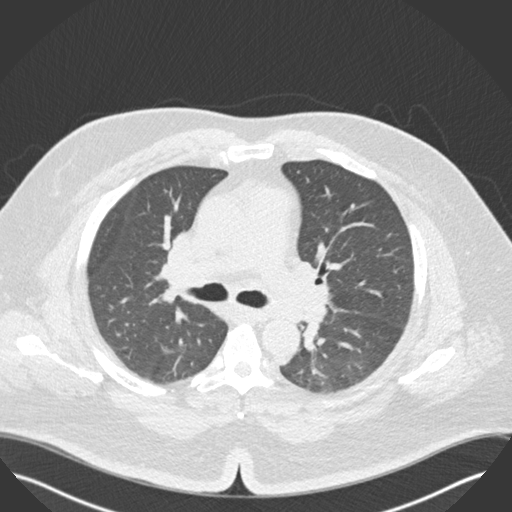
[im 111/162  mediastinal]
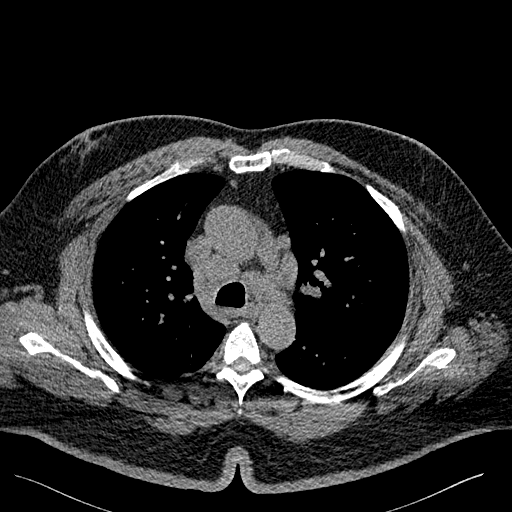
[im 111/162  lung]
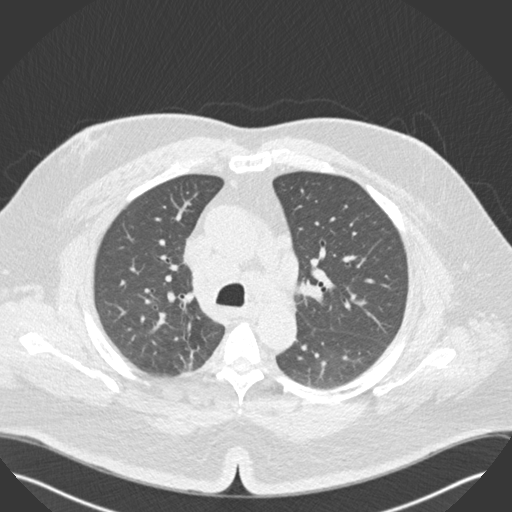
[im 128/162  lung]
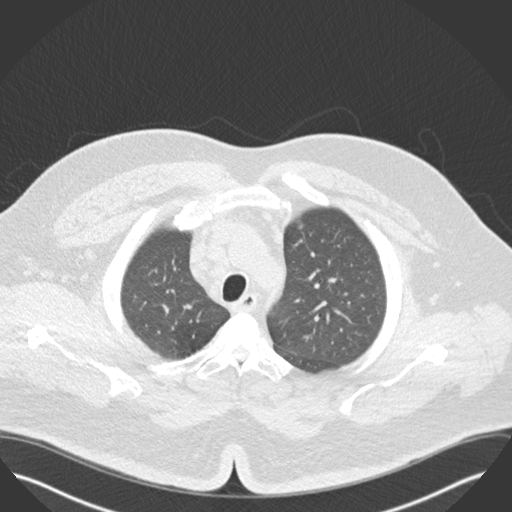
[im 136/162  lung]
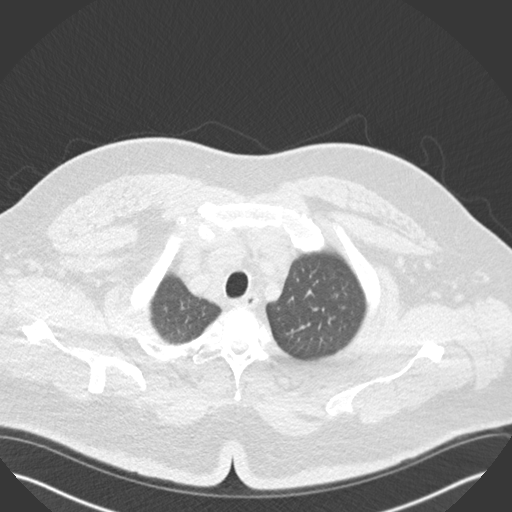
[im 153/162  lung]
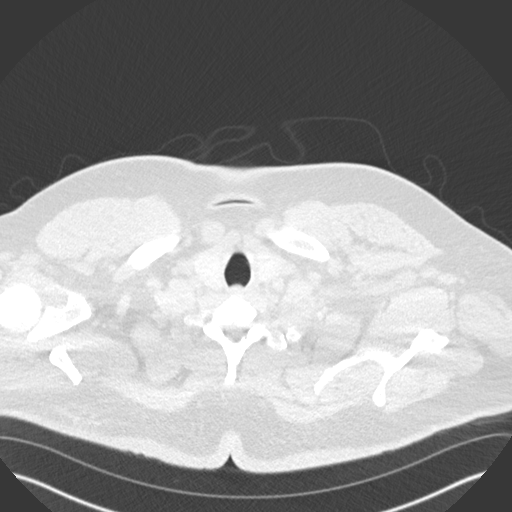

[Series 7: coronal · coronal · 0.61mm/px · 3 of 101 slices shown]
[im 21/101  lung]
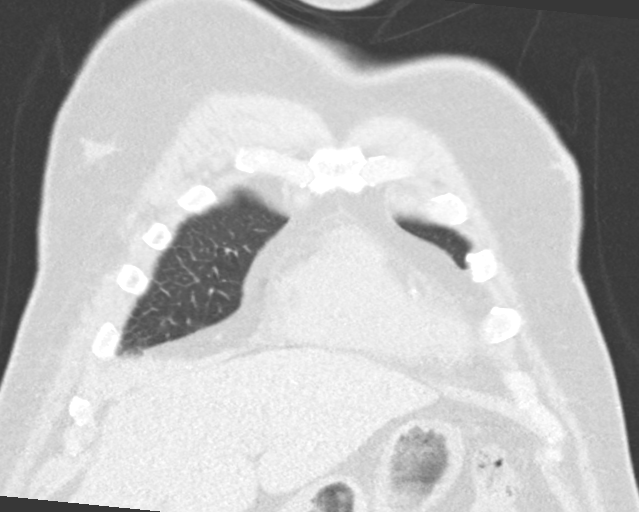
[im 41/101  lung]
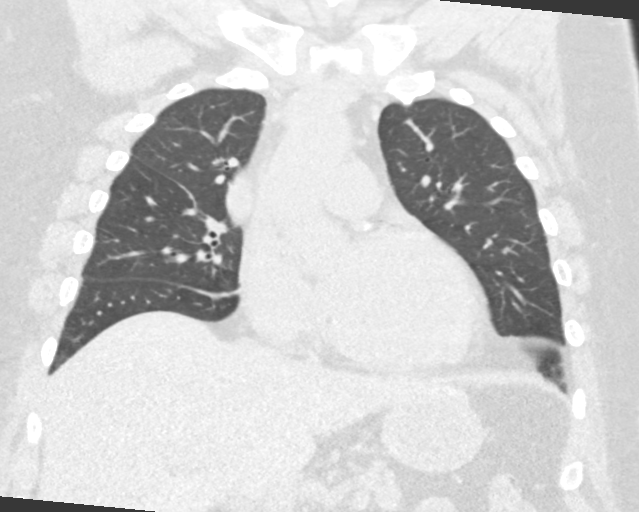
[im 61/101  lung]
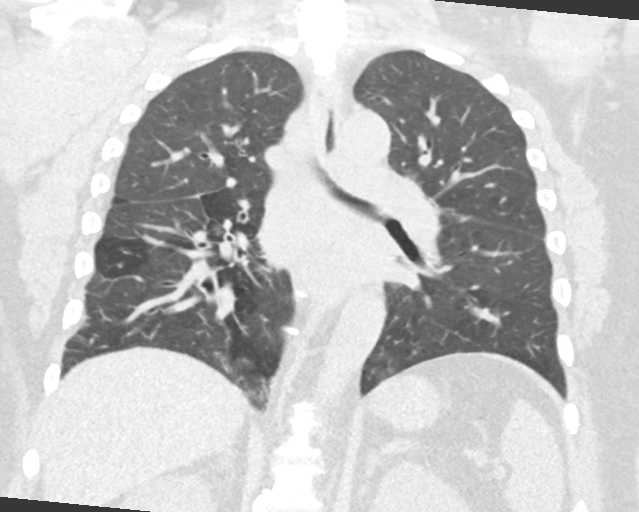

[15 of 36 positions shown; findings below may reference images not displayed]

FINDINGS: Cardiovascular: Atheromatous coronary artery calcifications. Mildly
enlarged heart.

Mediastinum/Nodes: Multiple enlarged mediastinal and bilateral hilar
lymph nodes. The hilar nodes are difficult to measure due to the
lack of intravenous contrast. The largest mediastinal nodes include
a right pretracheal node with a short axis diameter of 24 mm on
image number 22 series 3. These also include a subcarinal node with
a short axis diameter 25 mm on image number 29 series 3. No enlarged
axillary or supraclavicular nodes are seen. No enlarged nodes seen
in the upper abdomen.

Normal appearing adrenal gland. No visible esophageal abnormalities.

Lungs/Pleura: Mild linear atelectasis or scarring in the right upper
lobe and minimal linear atelectasis or scarring in the right lower
lobe. No lung nodules visualized. Mild patchy prominence of the
interstitial markings bilaterally. No pleural fluid.

Upper Abdomen: Small accessory splenule. Normal appearing included
portions of the adrenal glands.

Musculoskeletal: Mild bilateral gynecomastia. Thoracic spine
degenerative changes.
IMPRESSION: 1. Mediastinal and bilateral hilar adenopathy. Differential
considerations include lymphoma/leukemia, sarcoidosis and metastatic
lymph nodes. Metastatic nodes are less likely in the absence of CT
findings suspicious for lung cancer.
2. Mild patchy prominence of the interstitial markings bilaterally,
most likely due to interstitial pulmonary edema, interstitial
pneumonitis or chronic interstitial lung disease. This can be seen
with sarcoidosis.
3. Mild cardiomegaly.
4. Atheromatous coronary artery calcifications.
5. Mild bilateral gynecomastia.

## 2019-12-05 NOTE — Telephone Encounter (Signed)
-----   Message from Marily Lente, NP sent at 12/05/2019  1:52 PM EST ----- [name], Please let patient know that his CT scan showed enlarged lymph nodes as seen on the pre ablation CT scan. Please make sure he has follow up with his PCP to discuss further. I will route results to Dr Nehemiah Settle.  Thanks!

## 2019-12-05 NOTE — Telephone Encounter (Signed)
The patient has been notified of the CT results and verbalized understanding.  All questions (if any) were answered. Sigurd Sos, RN 12/05/2019 2:02 PM

## 2019-12-20 ENCOUNTER — Other Ambulatory Visit: Payer: Self-pay | Admitting: Internal Medicine

## 2019-12-23 ENCOUNTER — Ambulatory Visit: Payer: 59 | Admitting: Interventional Cardiology

## 2019-12-25 ENCOUNTER — Ambulatory Visit (HOSPITAL_COMMUNITY)
Admission: RE | Admit: 2019-12-25 | Discharge: 2019-12-25 | Disposition: A | Discharge status: Home / Self Care | Payer: 59 | Source: Ambulatory Visit | Attending: Physician Assistant | Admitting: Physician Assistant

## 2019-12-25 ENCOUNTER — Encounter (HOSPITAL_COMMUNITY): Payer: Self-pay | Admitting: Nurse Practitioner

## 2019-12-25 ENCOUNTER — Other Ambulatory Visit: Payer: Self-pay

## 2019-12-25 VITALS — BP 140/60 | HR 72 | Ht 69.0 in | Wt 313.4 lb

## 2019-12-25 DIAGNOSIS — R9431 Abnormal electrocardiogram [ECG] [EKG]: Secondary | ICD-10-CM | POA: Diagnosis not present

## 2019-12-25 DIAGNOSIS — Z7984 Long term (current) use of oral hypoglycemic drugs: Secondary | ICD-10-CM | POA: Insufficient documentation

## 2019-12-25 DIAGNOSIS — I10 Essential (primary) hypertension: Secondary | ICD-10-CM | POA: Diagnosis not present

## 2019-12-25 DIAGNOSIS — K219 Gastro-esophageal reflux disease without esophagitis: Secondary | ICD-10-CM | POA: Diagnosis not present

## 2019-12-25 DIAGNOSIS — G4733 Obstructive sleep apnea (adult) (pediatric): Secondary | ICD-10-CM | POA: Diagnosis not present

## 2019-12-25 DIAGNOSIS — Z79899 Other long term (current) drug therapy: Secondary | ICD-10-CM | POA: Insufficient documentation

## 2019-12-25 DIAGNOSIS — I110 Hypertensive heart disease with heart failure: Secondary | ICD-10-CM | POA: Insufficient documentation

## 2019-12-25 DIAGNOSIS — D6869 Other thrombophilia: Secondary | ICD-10-CM | POA: Diagnosis not present

## 2019-12-25 DIAGNOSIS — I5032 Chronic diastolic (congestive) heart failure: Secondary | ICD-10-CM | POA: Diagnosis not present

## 2019-12-25 DIAGNOSIS — E785 Hyperlipidemia, unspecified: Secondary | ICD-10-CM | POA: Insufficient documentation

## 2019-12-25 DIAGNOSIS — F17210 Nicotine dependence, cigarettes, uncomplicated: Secondary | ICD-10-CM | POA: Diagnosis not present

## 2019-12-25 DIAGNOSIS — I480 Paroxysmal atrial fibrillation: Secondary | ICD-10-CM | POA: Insufficient documentation

## 2019-12-25 DIAGNOSIS — Z7901 Long term (current) use of anticoagulants: Secondary | ICD-10-CM | POA: Insufficient documentation

## 2019-12-25 DIAGNOSIS — Z6841 Body Mass Index (BMI) 40.0 and over, adult: Secondary | ICD-10-CM | POA: Diagnosis not present

## 2019-12-25 NOTE — Progress Notes (Signed)
Primary Care Physician: Renford Dills, MD Primary Cardiologist: Dr Eldridge Dace Primary Electrophysiologist: Dr Johney Frame Referring Physician: Dr Tommas Olp [name] is a 49 y.o. male with a history of paroxysmal atrial fibrillation, PSVT, hypertension, obstructive sleep apnea on CPAP, prior alcohol/cocaine abuse, chronic diastolic heart failure, morbid obesity and ongoing tobacco smoking who presents for follow up in the [facility]. He was evaluated by Dr. Delane Ginger in 4/14 in the hospital during an admission with PAF with RVR.Ritalin was adrug tobe avoided in the futuresince the AFib started with medicine. He converted to NSR and long-term anticoagulation was recommended given his stroke risk. Placed on Xarelto for a CHADS2VASC score of 2. Patient was seen at [facility] with salvos of SVT on 09/30/19. He saw Dr Johney Frame in follow up on 10/01/19 and his flecainide dose was increased. Patient presented to ER on 10/29/19 with symptoms of tachypalpitations and diaphoresis. He was found to be in afib with RVR. He converted to SR with IV fluids.   On follow up today, patient is s/p afib ablation on 11/27/19. His atrial tachycardia could not be ablated. Patient reports that he has done well since his procedure with no further heart racing or palpitations. He denies CP, swallowing, or groin issues. He is back to normal activity.  Today, he denies symptoms of palpitations, chest pain, shortness of breath, orthopnea, PND, lower extremity edema, dizziness, presyncope, syncope, bleeding, or neurologic sequela. The patient is tolerating medications without difficulties and is otherwise without complaint today.    Atrial Fibrillation Risk Factors:  he does have symptoms or diagnosis of sleep apnea. he is compliant with CPAP therapy. he does have a history of alcohol use.  he has a BMI of Body mass index is 46.28 kg/m.Marland Kitchen Filed Weights   12/25/19 0930   Weight: (!) 142.2 kg    Family History  Problem Relation Age of Onset  . Diabetes Mother   . Heart attack Father 42  . Stroke Maternal Grandmother   . Stroke Paternal Grandmother   . Diabetes Paternal Grandfather      Atrial Fibrillation Management history:  Previous antiarrhythmic drugs: flecainide Previous cardioversions: none Previous ablations: 11/27/19 CHADS2VASC score: 2 Anticoagulation history: Xarelto   Past Medical History:  Diagnosis Date  . Diabetes mellitus   . GERD (gastroesophageal reflux disease)   . History of alcohol abuse   . History of cocaine use   . History of echocardiogram    a. Echo 4/14: Moderate LVH, vigorous LVEF, EF 65-70%, normal wall motion, grade 2 diastolic dysfunction, mildly dilated aortic root and ascending aorta, ascending aorta 40 mm, aortic root 38 mm, mild LAE  . Hx of cardiovascular stress test    a. GXT 5/14: No ischemic changes  //  b. ETT-Myoview 3/16:  Low risk, no ischemia, EF 58%  . Hyperlipidemia   . Hypertension   . Morbid obesity (HCC)   . Paroxysmal atrial fibrillation (HCC)    Occurring in 2008, with several recurrence since then (including in the setting of + cocaine on UDS).  . Sleep apnea    uses CPAP  . SVT (supraventricular tachycardia) (HCC)    mid to long RP SVT 09/2019   Past Surgical History:  Procedure Laterality Date  . APPENDECTOMY    . ATRIAL FIBRILLATION ABLATION N/A 11/27/2019   Procedure: ATRIAL FIBRILLATION ABLATION;  Surgeon: [name], MD;  Location: [facility];  Service: Cardiovascular;  Laterality: N/A;  . ROTATOR  CUFF REPAIR    . SVT ABLATION N/A 11/27/2019   Procedure: SVT ABLATION;  Surgeon: Thompson Grayer, MD;  Location: [facility];  Service: Cardiovascular;  Laterality: N/A;  . TONSILLECTOMY      Current Outpatient Medications  Medication Sig Dispense Refill  . atorvastatin (LIPITOR) 10 MG tablet Take 10 mg by mouth daily.    . Blood Pressure Monitoring (BLOOD PRESSURE  MONITOR/L CUFF) MISC 1 Units by Does not apply route daily. 1 each 0  . diltiazem (CARDIZEM) 30 MG tablet Take 1 Tablet Every 4 Hours As Needed For HR >100 45 tablet 2  . flecainide (TAMBOCOR) 100 MG tablet Take 1.5 tablets (150 mg total) by mouth 2 (two) times daily. 90 tablet 3  . glipiZIDE (GLUCOTROL) 10 MG tablet Take 10 mg by mouth 2 (two) times daily.    Marland Kitchen lisinopril (ZESTRIL) 40 MG tablet Take 40 mg by mouth daily.    . metFORMIN (GLUCOPHAGE) 500 MG tablet Take 1,000 mg by mouth daily with breakfast.     . metoprolol succinate (TOPROL-XL) 100 MG 24 hr tablet Take 1 tablet (100 mg total) by mouth daily. 30 tablet 6  . pantoprazole (PROTONIX) 40 MG tablet TAKE 1 TABLET BY MOUTH EVERY DAY 30 tablet 5  . XARELTO 20 MG TABS tablet TAKE 1 TABLET (20 MG TOTAL) BY MOUTH DAILY WITH SUPPER. 30 tablet 5   No current facility-administered medications for this encounter.    Allergies  Allergen Reactions  . Shrimp [Shellfish Allergy] Anaphylaxis  . Banana Other (See Comments)    Pt gags  . Watermelon Flavor Nausea And Vomiting  . Peanut-Containing Drug Products Itching and Cough  . Almond Oil Itching    Roof of mouth itches  . Sulfa Antibiotics Itching    Social History   Socioeconomic History  . Marital status: Single    Spouse name: Not on file  . Number of children: 0  . Years of education: 66  . Highest education level: Not on file  Occupational History  . Occupation: Merchandiser, retail: UNEMPLOYED  Tobacco Use  . Smoking status: Current Every Day Smoker    Packs/day: 0.50    Years: 28.00    Pack years: 14.00    Types: Cigarettes  . Smokeless tobacco: Former Systems developer    Types: Snuff  . Tobacco comment: smokes one pack per day 05/12/2019  Substance and Sexual Activity  . Alcohol use: Not Currently    Alcohol/week: 6.0 standard drinks    Types: 6 Standard drinks or equivalent per week    Comment: monthly  . Drug use: No    Comment: cocaine in the past, none currently  .  Sexual activity: Not on file  Other Topics Concern  . Not on file  Social History Narrative   Fun: Designer, fashion/clothing       Lives in [place] with sister.      Unemployed, was working a Land job   Social Determinants of Radio broadcast assistant Strain:   . Difficulty of Paying Living Expenses:   Food Insecurity:   . Worried About Charity fundraiser in the Last Year:   . Arboriculturist in the Last Year:   Transportation Needs:   . Film/video editor (Medical):   Marland Kitchen Lack of Transportation (Non-Medical):   Physical Activity:   . Days of Exercise per Week:   . Minutes of Exercise per Session:   Stress:   . Feeling  of Stress :   Social Connections:   . Frequency of Communication with Friends and Family:   . Frequency of Social Gatherings with Friends and Family:   . Attends Religious Services:   . Active Member of Clubs or Organizations:   . Attends Banker Meetings:   Marland Kitchen Marital Status:   Intimate Partner Violence:   . Fear of Current or Ex-Partner:   . Emotionally Abused:   Marland Kitchen Physically Abused:   . Sexually Abused:      ROS- All systems are reviewed and negative except as per the HPI above.  Physical Exam: Vitals:   12/25/19 0930  BP: 140/60  Pulse: 72  Weight: (!) 142.2 kg  Height: 5\' 9"  (1.753 m)    GEN- The patient is well appearing obese male, alert and oriented x 3 today.   HEENT-head normocephalic, atraumatic, sclera clear, conjunctiva pink, hearing intact, trachea midline. Lungs- Clear to ausculation bilaterally, normal work of breathing Heart- Regular rate and rhythm, no murmurs, rubs or gallops  GI- soft, NT, ND, + BS Extremities- no clubbing, cyanosis, or edema MS- no significant deformity or atrophy Skin- no rash or lesion Psych- euthymic mood, full affect Neuro- strength and sensation are intact   Wt Readings from Last 3 Encounters:  12/25/19 (!) 142.2 kg  11/27/19 (!) 137.9 kg  11/10/19 (!) 137.9 kg    EKG today  demonstrates SR HR 72, LAFB, PR 174, QRS 94, QTc 446  Echo 07/08/19 demonstrated  1. Left ventricular ejection fraction, by visual estimation, is 60 to 65%. The left ventricle has normal function. Normal left ventricular size. There is no left ventricular hypertrophy.  2. Left ventricular diastolic Doppler parameters are consistent with impaired relaxation pattern of LV diastolic filling.  3. Global right ventricle has normal systolic function.The right ventricular size is normal. No increase in right ventricular wall thickness.  4. Left atrial size was normal.  5. Right atrial size was normal.  6. The mitral valve is normal in structure. No evidence of mitral valve regurgitation. No evidence of mitral stenosis.  7. The tricuspid valve is normal in structure. Tricuspid valve regurgitation was not visualized by color flow Doppler.  8. The aortic valve is normal in structure. Aortic valve regurgitation was not visualized by color flow Doppler. Structurally normal aortic valve, with no evidence of sclerosis or stenosis.  9. The pulmonic valve was normal in structure. Pulmonic valve regurgitation is not visualized by color flow Doppler. 10. The inferior vena cava is normal in size with greater than 50% respiratory variability, suggesting right atrial pressure of 3 mmHg.  Epic records are reviewed at length today  Assessment and Plan:  1. Paroxysmal atrial fibrillation/SVT S/p afib ablation 11/27/19. Patient appears to be maintaining SR. Continue flecainide 150 mg BID Continue Toprol to 100 mg daily. Continue diltiazem 30 mg PRN q 4 hours for heart racing. Continue Xarelto 20 mg daily with no missed doses for at least 3 months post ablation. Lifestyle modification was discussed and encouraged including smoking cessation and weight reduction.  This patients CHA2DS2-VASc Score and unadjusted Ischemic Stroke Rate (% per year) is equal to 2.2 % stroke rate/year from a score of 2  Above score  calculated as 1 point each if present [CHF, HTN, DM, Vascular=MI/PAD/Aortic Plaque, Age if 65-74, or Male] Above score calculated as 2 points each if present [Age > 75, or Stroke/TIA/TE]   2. Obesity Body mass index is 46.28 kg/m. Lifestyle modification was discussed and encouraged including  regular physical activity and weight reduction.  3. Obstructive sleep apnea The importance of adequate treatment of sleep apnea was discussed today in order to improve our ability to maintain sinus rhythm long term. Patient reports compliance with CPAP therapy.  4. HTN Stable, no changes today.   Follow up with Dr Eldridge Dace and Dr Johney Frame as scheduled.    Jorja Loa PA-C Af[facility] Clinic [facility] [address] [place], [place] [address] [phone] 12/25/2019 9:52 AM

## 2019-12-30 ENCOUNTER — Other Ambulatory Visit (HOSPITAL_COMMUNITY): Payer: Self-pay | Admitting: Physician Assistant

## 2019-12-30 DIAGNOSIS — I119 Hypertensive heart disease without heart failure: Secondary | ICD-10-CM

## 2019-12-30 DIAGNOSIS — I480 Paroxysmal atrial fibrillation: Secondary | ICD-10-CM

## 2020-01-06 ENCOUNTER — Ambulatory Visit (INDEPENDENT_AMBULATORY_CARE_PROVIDER_SITE_OTHER)
Admission: RE | Admit: 2020-01-06 | Discharge: 2020-01-06 | Disposition: A | Discharge status: Home / Self Care | Payer: 59 | Source: Ambulatory Visit | Attending: Internal Medicine | Admitting: Internal Medicine

## 2020-01-06 ENCOUNTER — Other Ambulatory Visit: Payer: Self-pay

## 2020-01-06 ENCOUNTER — Ambulatory Visit: Payer: 59 | Admitting: Internal Medicine

## 2020-01-06 ENCOUNTER — Encounter: Payer: Self-pay | Admitting: Internal Medicine

## 2020-01-06 VITALS — BP 150/92 | HR 83 | Temp 97.3°F | Ht 69.0 in | Wt 314.6 lb

## 2020-01-06 DIAGNOSIS — G47411 Narcolepsy with cataplexy: Secondary | ICD-10-CM

## 2020-01-06 DIAGNOSIS — Z720 Tobacco use: Secondary | ICD-10-CM

## 2020-01-06 DIAGNOSIS — G4733 Obstructive sleep apnea (adult) (pediatric): Secondary | ICD-10-CM

## 2020-01-06 DIAGNOSIS — D869 Sarcoidosis, unspecified: Secondary | ICD-10-CM

## 2020-01-06 DIAGNOSIS — R590 Localized enlarged lymph nodes: Secondary | ICD-10-CM | POA: Diagnosis not present

## 2020-01-06 LAB — COMPREHENSIVE METABOLIC PANEL
ALT: 26 U/L (ref 0–53)
AST: 23 U/L (ref 0–37)
Albumin: 3.8 g/dL (ref 3.5–5.2)
Alkaline Phosphatase: 99 U/L (ref 39–117)
BUN: 17 mg/dL (ref 6–23)
CO2: 27 mEq/L (ref 19–32)
Calcium: 9 mg/dL (ref 8.4–10.5)
Chloride: 107 mEq/L (ref 96–112)
Creatinine, Ser: 1.25 mg/dL (ref 0.40–1.50)
GFR: 74.23 mL/min (ref >60.00)
Glucose, Bld: 157 mg/dL — ABNORMAL HIGH (ref 70–99)
Potassium: 5 mEq/L (ref 3.5–5.1)
Sodium: 137 mEq/L (ref 135–145)
Total Bilirubin: 0.3 mg/dL (ref 0.2–1.2)
Total Protein: 7.4 g/dL (ref 6.0–8.3)

## 2020-01-06 IMAGING — DX DG CHEST 2V
2 series · 2 of 2 positions shown · non-contrast
Comparison: [DATE]

CLINICAL DATA: Mediastinal adenopathy, diabetes mellitus,
hypertension, smoker

EXAM:
CHEST - 2 VIEW

[chest pa]
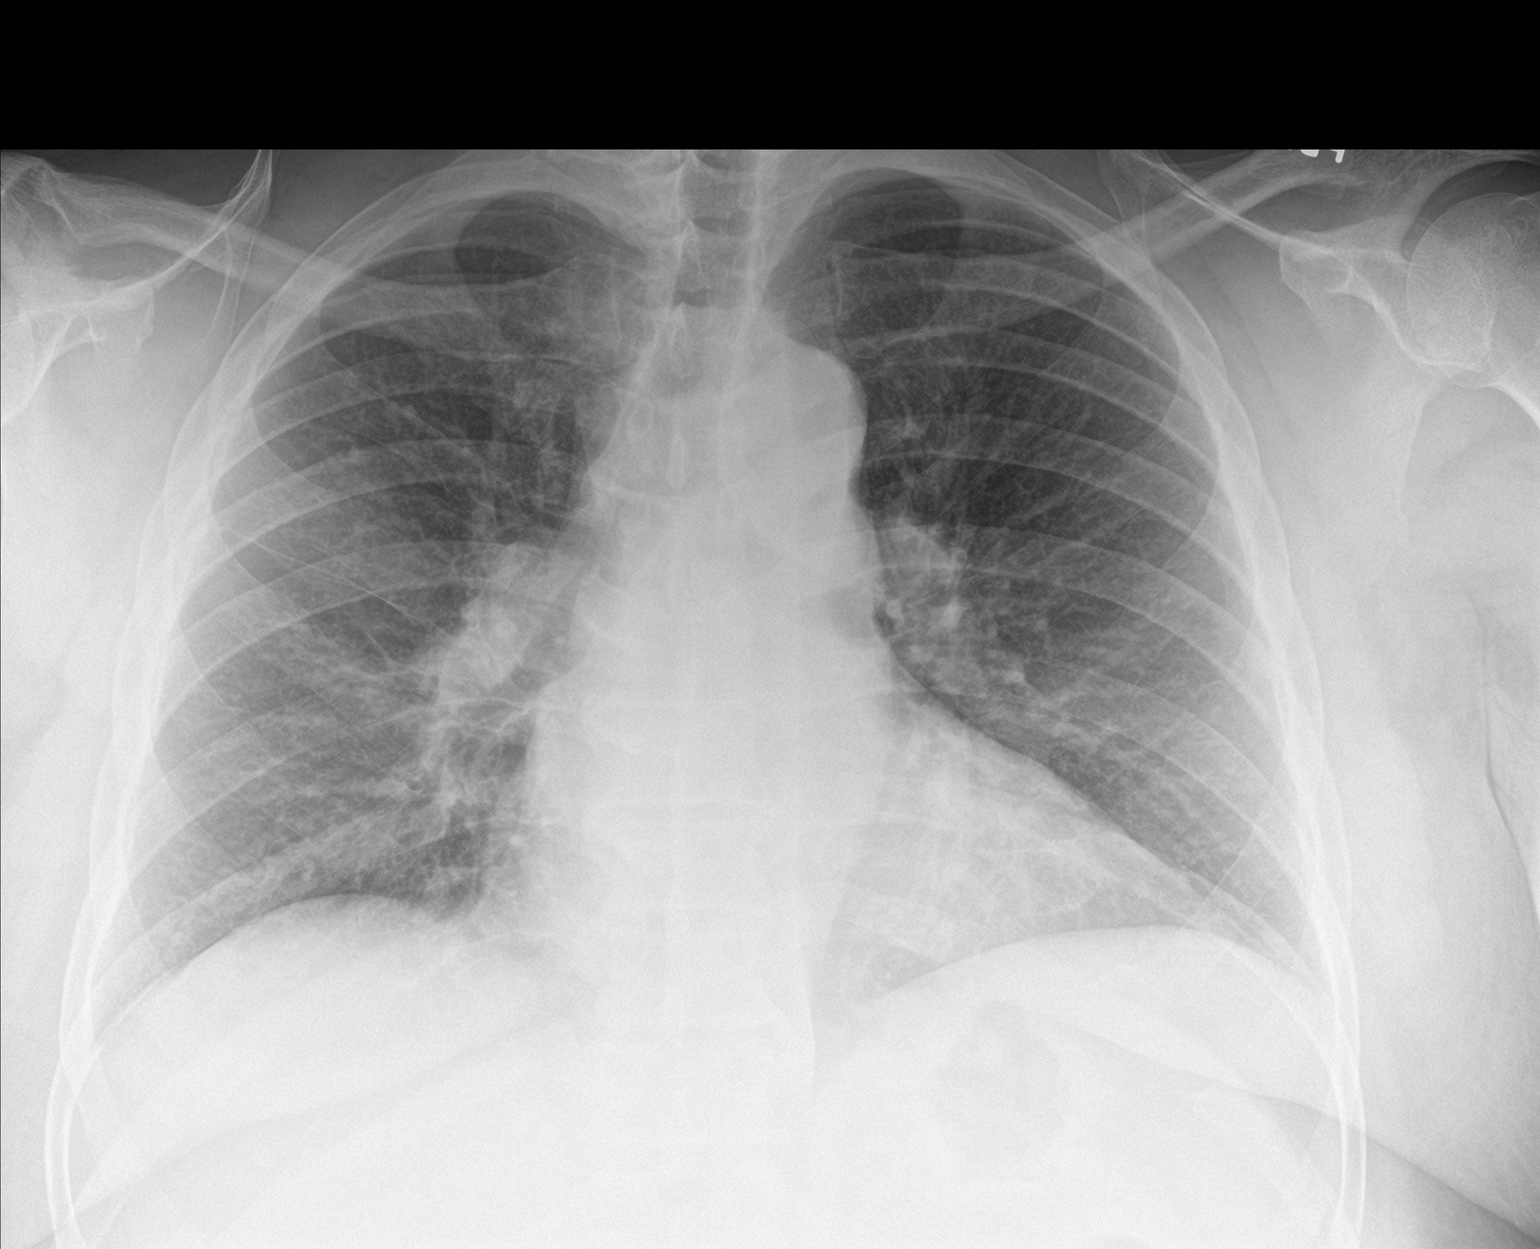

[chest lat]
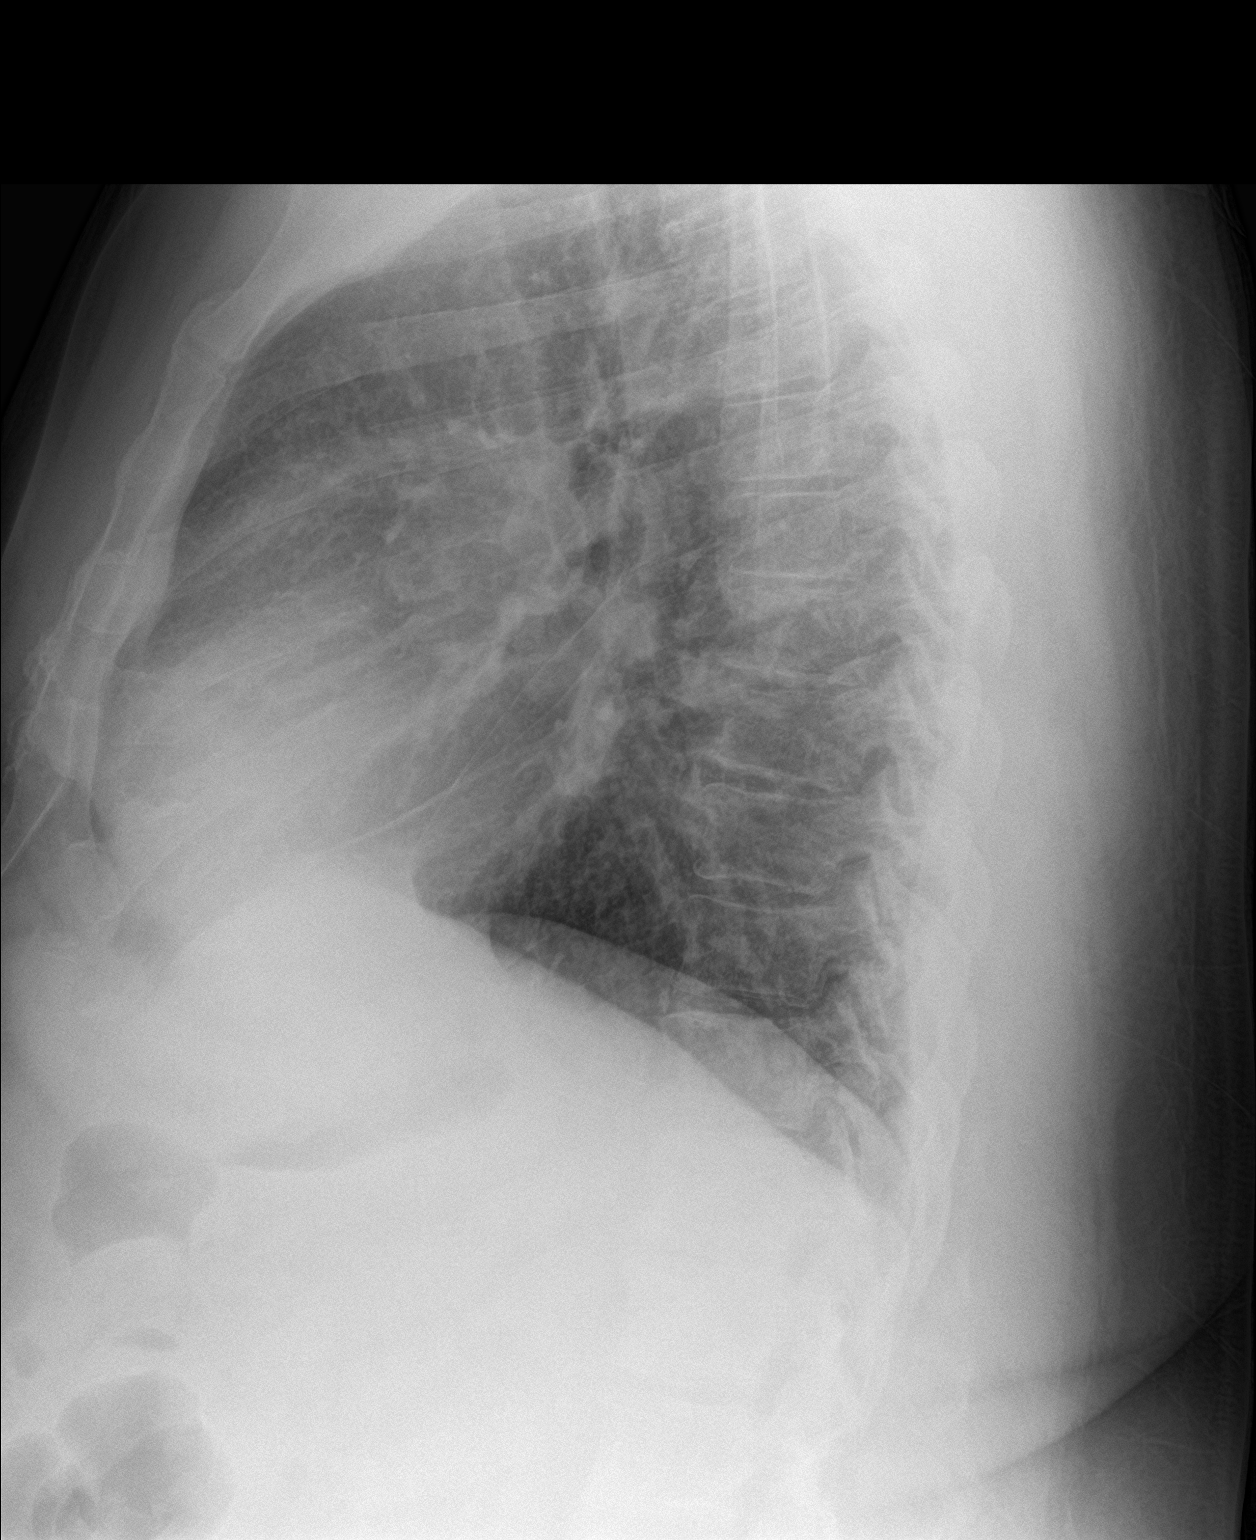

[2 of 2 positions shown; findings below may reference images not displayed]

FINDINGS: Minimal enlargement of cardiac silhouette.

Chronic hilar enlargement.

Mediastinal contours and pulmonary vascularity otherwise normal.

Central peribronchial thickening and accentuation of perihilar
markings, greatest RIGHT mid lung, decreased since previous exam.

No acute infiltrate, pleural effusion or pneumothorax.

Osseous structures unremarkable.
IMPRESSION: Chronic bronchitic changes and slight accentuation of perihilar
markings.

No acute abnormalities.

Minimal enlargement of cardiac silhouette.

## 2020-01-06 NOTE — Assessment & Plan Note (Signed)
Not motivated, but I pressed him again towards making some effort to stop. Support offered.

## 2020-01-06 NOTE — Patient Instructions (Signed)
Please stop smoking - you know its important  You are doing great with CPAP - continue auto 4-17, mask of choice, humidifier, supplies, Airview/ card  Consider trying otc saline nasal gel as needed for dry nose  Order- CXR     Dx mediastinal adenopathy  Order- Lab- CMET, Angiotensin Converting Enzyme level   Dx Sarcoid  Please call as needed

## 2020-01-06 NOTE — Assessment & Plan Note (Signed)
No regular job, so his schedule is very casual and drowsiness, improved by treatment of his OSA, is less disruptive. Little cataplexy now. No longer using stimulant med. Emphasis on driving safety.

## 2020-01-06 NOTE — Assessment & Plan Note (Signed)
Benefits with good compliance and control. Plan- continue CPAP auto 4-17

## 2020-01-06 NOTE — Assessment & Plan Note (Signed)
Sarcoid is most likely explanation. Plan- CXR to compare with CT from 1 month ago. ACE level and check of chemistries for liver and kidney. Discussed plan to repeat CT after next visit.

## 2020-01-06 NOTE — Progress Notes (Signed)
HPI  male smoker followed for management of OSA and narcolepsy with cataplexy, complicated by allergic rhinitis, atrial fibrillation, dCHF, DM, HTN NPSG 11/23/00- AHI 20 per hour. Hypnagogic hallucination associated with dreaming. 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 Vivid dreams as soon as he falls asleep Multiple Sleep Latency Test 10/30/2012-pathologic daytime hypersomnia, nonspecific, compatible with idiopathic hypersomnia or narcolepsy. Mean latency 0.9 minutes with one sleep onset REM event  ---------------------------------------------------------------------------------------  05/12/2019- 49 year old male Smoker followed for management of OSA and narcolepsy with cataplexy, complicated by allergic rhinitis, atrial fibrillation/Xarelto flecainide, dCHF, Diastolic Dysfunction, DM2, HTN, Pancreatitis, Obesity CPAP 4-20/[facility]  Pressure range 11- 17.2 Download 83% compliance, AHI 8.5/ hr Body weight today- 293 lbs -----OSA on CPAP 4-20; DME: [facility], no complaints He has been compliant with CPAP and reports sleeping very well using full face mask. Denies daytime sleepiness now. No longer needs Adderall.  We did discuss naps and caffeine if ever needed.   [birth year]/21-49 year old male Smoker followed for management of OSA and narcolepsy with cataplexy, complicated by allergic rhinitis, atrial fibrillation/Xarelto flecainide, dCHF, Diastolic Dysfunction, CAD, DM2, HTN, Pancreatitis, Obesity, Tobacco use,  CPAP auto 4-17/ [facility] Download compliance 100%, AHI 1/ hr Body weight today 314 lbs Smoking 1 ppd Wakes dry nose in AM Using CPAP well. Unemployed except part time work on [employer]. Still dozes if bored. No longer using stimulant med. R shoulder pain comes and goes. Discussed CT findings with hilar adenopathy. No hx TB, adenopathy, adenopathy, rash or fever.  CT coronary led to CT chest 12/05/19-  IMPRESSION: 1. Mediastinal and bilateral hilar adenopathy.  Differential considerations include lymphoma/leukemia, sarcoidosis and metastatic lymph nodes. Metastatic nodes are less likely in the absence of CT findings suspicious for lung cancer. 2. Mild patchy prominence of the interstitial markings bilaterally, most likely due to interstitial pulmonary edema, interstitial pneumonitis or chronic interstitial lung disease. This can be seen with sarcoidosis. 3. Mild cardiomegaly. 4. Atheromatous coronary artery calcifications. 5. Mild bilateral gynecomastia.   ROS-see HPI  + = positive Constitutional:   No-   weight loss, night sweats, fevers, chills,  +fatigue, lassitude. HEENT:   No-  headaches, difficulty swallowing, tooth/dental problems, sore throat,       No-  sneezing, itching, ear ache, + nasal congestion, post nasal drip,  CV:  No-   chest pain, orthopnea, PND, swelling in lower extremities, anasarca, dizziness, palpitations Resp: No-   shortness of breath with exertion or at rest.              No-   productive cough,  No non-productive cough,  No- coughing up of blood.              No-   change in color of mucus.  No- wheezing.   Skin: No-   rash or lesions. GI:  No-   heartburn, indigestion, abdominal pain, nausea, vomiting,  GU:  MS:  No-   joint pain or swelling.  Neuro-     +HPI Psych:  No- change in mood or affect. No depression or anxiety.  No memory loss.  OBJ- Physical Exam   General- Alert/ calm, Oriented, Affect-appropriate, Distress- none acute, +obese,  Skin- rash-none, lesions- none, excoriation- none Lymphadenopathy- none Head- atraumatic            Eyes- Gross vision intact, PERRLA, conjunctivae and secretions clear            Ears- Hearing, canals-normal            Nose-  rhinitis/ turbinate edema, no-Septal dev, mucus, polyps, erosion, perforation             Throat- Mallampati III , mucosa clear , drainage- none, tonsils- atrophic Neck- flexible , trachea midline, no stridor , thyroid nl, carotid no bruit Chest -  symmetrical excursion , unlabored           Heart/CV- RRR , no murmur , no gallop  , no rub, nl s1 s2                           - JVD- none , edema- none, stasis changes- none, varices- none           Lung- clear to P&A, wheeze- none, cough- none , dullness-none, rub- none           Chest wall-  Abd-  Br/ Gen/ Rectal- Not done, not indicated Extrem- cyanosis- none, clubbing, none, atrophy- none, strength- nl

## 2020-01-07 LAB — ANGIOTENSIN CONVERTING ENZYME: Angiotensin-Converting Enzyme: <5 U/L — ABNORMAL LOW (ref 9–67)

## 2020-01-25 NOTE — Progress Notes (Deleted)
Cardiology Office Note   Date:  01/25/2020   ID:  Bastian Andreoli, DOB [birth year], MRN [id number]  PCP:  Seward Carol, MD    No chief complaint on file.  Coronary artery calcification, atrial fibrillation  Wt Readings from Last 3 Encounters:  01/06/20 (!) 314 lb 9.6 oz (142.7 kg)  12/25/19 (!) 313 lb 6.4 oz (142.2 kg)  11/27/19 (!) 304 lb (137.9 kg)       History of Present Illness: [name] is a 49 y.o. male  Who has a hx of HTN, DM2, obesity, OSA on CPAP, prior ETOH/cocaine use and PAF. Remotely seen by Dr. Verlon Setting in the past. He was evaluated by Dr. Liam Rogers in 4/14 in the hospital during an admission with PAF with RVR.Ritalin was adrug tobe avoided in the futuresince the AFib started with medicine. He converted to NSR and long-term anticoagulation was recommended given his stroke risk. Flecainide was added to maintain NSR. He was to follow up in this office for ETT to rule out pro-arrhythmia, but was not seen here until 2/16. Review of his chart indicates he had an ETT at [facility] in 02/2013. He was placed back on Flecainide and FU ETT-Myoview was low risk and neg for pro-arrhythmia.   CHADS2-VASc=2 (HTN, DM)  Patient has been taking Adderall for narcolepsy. Retired from his job with the city due to narcolepsy.  The patient does not have symptoms concerning for COVID-19 infection (fever, chills, cough, or new shortness of breath).   Echo 07/08/19 demonstrated  1. Left ventricular ejection fraction, by visual estimation, is 60 to 65%. The left ventricle has normal function. Normal left ventricular size. There is no left ventricular hypertrophy. 2. Left ventricular diastolic Doppler parameters are consistent with impaired relaxation pattern of LV diastolic filling. 3. Global right ventricle has normal systolic function.The right ventricular size is normal. No increase in right ventricular wall thickness. 4. Left atrial size was  normal. 5. Right atrial size was normal. 6. The mitral valve is normal in structure. No evidence of mitral valve regurgitation. No evidence of mitral stenosis. 7. The tricuspid valve is normal in structure. Tricuspid valve regurgitation was not visualized by color flow Doppler. 8. The aortic valve is normal in structure. Aortic valve regurgitation was not visualized by color flow Doppler. Structurally normal aortic valve, with no evidence of sclerosis or stenosis. 9. The pulmonic valve was normal in structure. Pulmonic valve regurgitation is not visualized by color flow Doppler. 10. The inferior vena cava is normal in size with greater than 50% respiratory variability, suggesting right atrial pressure of 3 mmHg.  He has been followed in EP clinic for AFib.  Last visit plan was: "S/p afib ablation 11/27/19. Patient appears to be maintaining SR. Continue flecainide 150 mg BID Continue Toprol to 100 mg daily. Continue diltiazem 30 mg PRN q 4 hours for heart racing. Continue Xarelto 20 mg daily with no missed doses for at least 3 months post ablation. Lifestyle modification was discussed and encouraged including smoking cessation and weight reduction."    Past Medical History:  Diagnosis Date  . Diabetes mellitus   . GERD (gastroesophageal reflux disease)   . History of alcohol abuse   . History of cocaine use   . History of echocardiogram    a. Echo 4/14: Moderate LVH, vigorous LVEF, EF 65-70%, normal wall motion, grade 2 diastolic dysfunction, mildly dilated aortic root and ascending aorta, ascending aorta 40 mm, aortic root 38 mm, mild LAE  .  Hx of cardiovascular stress test    a. GXT 5/14: No ischemic changes  //  b. ETT-Myoview 3/16:  Low risk, no ischemia, EF 58%  . Hyperlipidemia   . Hypertension   . Morbid obesity (HCC)   . Paroxysmal atrial fibrillation (HCC)    Occurring in 2008, with several recurrence since then (including in the setting of + cocaine on UDS).  . Sleep  apnea    uses CPAP  . SVT (supraventricular tachycardia) (HCC)    mid to long RP SVT 09/2019    Past Surgical History:  Procedure Laterality Date  . APPENDECTOMY    . ATRIAL FIBRILLATION ABLATION N/A 11/27/2019   Procedure: ATRIAL FIBRILLATION ABLATION;  Surgeon: [name], MD;  Location: [facility];  Service: Cardiovascular;  Laterality: N/A;  . ROTATOR CUFF REPAIR    . SVT ABLATION N/A 11/27/2019   Procedure: SVT ABLATION;  Surgeon: [name], MD;  Location: [facility] INVASIVE CV LAB;  Service: Cardiovascular;  Laterality: N/A;  . TONSILLECTOMY       Current Outpatient Medications  Medication Sig Dispense Refill  . atorvastatin (LIPITOR) 10 MG tablet Take 10 mg by mouth daily.    . Blood Pressure Monitoring (BLOOD PRESSURE MONITOR/L CUFF) MISC 1 Units by Does not apply route daily. 1 each 0  . diltiazem (CARDIZEM) 30 MG tablet Take 1 Tablet Every 4 Hours As Needed For HR >100 45 tablet 2  . flecainide (TAMBOCOR) 100 MG tablet Take 1.5 tablets (150 mg total) by mouth 2 (two) times daily. 90 tablet 3  . glipiZIDE (GLUCOTROL) 10 MG tablet Take 10 mg by mouth 2 (two) times daily.    Marland Kitchen lisinopril (ZESTRIL) 40 MG tablet Take 40 mg by mouth daily.    . metFORMIN (GLUCOPHAGE) 500 MG tablet Take 1,000 mg by mouth daily with breakfast.     . metoprolol succinate (TOPROL-XL) 100 MG 24 hr tablet TAKE 1 TABLET BY MOUTH EVERY DAY 30 tablet 6  . pantoprazole (PROTONIX) 40 MG tablet TAKE 1 TABLET BY MOUTH EVERY DAY 30 tablet 5  . XARELTO 20 MG TABS tablet TAKE 1 TABLET (20 MG TOTAL) BY MOUTH DAILY WITH SUPPER. 30 tablet 5   No current facility-administered medications for this visit.    Allergies:   Shrimp [shellfish allergy], Banana, Watermelon flavor, Peanut-containing drug products, Almond oil, and Sulfa antibiotics    Social History:  The patient  reports that he has been smoking cigarettes. He has a 14.00 pack-year smoking history. He has quit using smokeless tobacco.  His  smokeless tobacco use included snuff. He reports previous alcohol use of about 6.0 standard drinks of alcohol per week. He reports that he does not use drugs.   Family History:  The patient's ***family history includes Diabetes in his mother and paternal grandfather; Heart attack (age of onset: 77) in his father; Stroke in his maternal grandmother and paternal grandmother.    ROS:  Please see the history of present illness.   Otherwise, review of systems are positive for ***.   All other systems are reviewed and negative.    PHYSICAL EXAM: VS:  There were no vitals taken for this visit. , BMI There is no height or weight on file to calculate BMI. GEN: Well nourished, well developed, in no acute distress  HEENT: normal  Neck: no JVD, carotid bruits, or masses Cardiac: ***RRR; no murmurs, rubs, or gallops,no edema  Respiratory:  clear to auscultation bilaterally, normal work of breathing GI: soft, nontender,  nondistended, + BS MS: no deformity or atrophy  Skin: warm and dry, no rash Neuro:  Strength and sensation are intact Psych: euthymic mood, full affect   EKG:   The ekg ordered today demonstrates ***   Recent Labs: 09/29/2019: B Natriuretic Peptide 222.2 09/30/2019: Magnesium 1.9; TSH 2.069 11/27/2019: Hemoglobin 12.9; Platelets 289 01/06/2020: ALT 26; BUN 17; Creatinine, Ser 1.25; Potassium 5.0; Sodium 137   Lipid Panel    Component Value Date/Time   CHOL 137 01/22/2019 0921   TRIG 133 01/22/2019 0921   HDL 29 (L) 01/22/2019 0921   CHOLHDL 4.7 01/22/2019 0921   VLDL 27 01/22/2019 0921   LDLCALC 81 01/22/2019 0921   LDLDIRECT 103.0 12/08/2016 0852     Other studies Reviewed: Additional studies/ records that were reviewed today with results demonstrating: ***.   ASSESSMENT AND PLAN:  1. Chronic atrial fibrillation:  2. Obesity: 3. OSA: 4. HTN: 5. Chronic diastolic heart failure: 6. Anticoagulated: 7. DM: A1C was > 11 in 11/2018.    Current medicines are  reviewed at length with the patient today.  The patient concerns regarding his medicines were addressed.  The following changes have been made:  No change***  Labs/ tests ordered today include: *** No orders of the defined types were placed in this encounter.   Recommend 150 minutes/week of aerobic exercise Low fat, low carb, high fiber diet recommended  Disposition:   FU in ***   Signed, Lance Muss, MD  01/25/2020 10:08 AM    [facility] [address] [place], [place], [place]  [address] Phone: [phone]; Fax: [phone]

## 2020-01-26 ENCOUNTER — Ambulatory Visit: Payer: 59 | Admitting: Interventional Cardiology

## 2020-02-05 NOTE — Progress Notes (Deleted)
Cardiology Office Note   Date:  02/05/2020   ID:  [name], DOB [birth year], MRN [id number]  PCP:  Seward Carol, MD    No chief complaint on file.  Coronary artery calcification, atrial fibrillation  Wt Readings from Last 3 Encounters:  01/06/20 (!) 314 lb 9.6 oz (142.7 kg)  12/25/19 (!) 313 lb 6.4 oz (142.2 kg)  11/27/19 (!) 304 lb (137.9 kg)       History of Present Illness: [name] is a 49 y.o. male  Who has a hx of HTN, DM2, obesity, OSA on CPAP, prior ETOH/cocaine use and PAF. Remotely seen by Dr. Verlon Setting in the past. He was evaluated by Dr. Liam Rogers in 4/14 in the hospital during an admission with PAF with RVR.Ritalin was adrug tobe avoided in the futuresince the AFib started with medicine. He converted to NSR and long-term anticoagulation was recommended given his stroke risk. Flecainide was added to maintain NSR. He was to follow up in this office for ETT to rule out pro-arrhythmia, but was not seen here until 2/16. Review of his chart indicates he had an ETT at [facility] in 02/2013. He was placed back on Flecainide and FU ETT-Myoview was low risk and neg for pro-arrhythmia.   CHADS2-VASc=2 (HTN, DM)  Patient has been taking Adderall for narcolepsy. Retired from his job with the city due to narcolepsy.  The patient does not have symptoms concerning for COVID-19 infection (fever, chills, cough, or new shortness of breath).   Echo 07/08/19 demonstrated  1. Left ventricular ejection fraction, by visual estimation, is 60 to 65%. The left ventricle has normal function. Normal left ventricular size. There is no left ventricular hypertrophy. 2. Left ventricular diastolic Doppler parameters are consistent with impaired relaxation pattern of LV diastolic filling. 3. Global right ventricle has normal systolic function.The right ventricular size is normal. No increase in right ventricular wall thickness. 4. Left atrial size was  normal. 5. Right atrial size was normal. 6. The mitral valve is normal in structure. No evidence of mitral valve regurgitation. No evidence of mitral stenosis. 7. The tricuspid valve is normal in structure. Tricuspid valve regurgitation was not visualized by color flow Doppler. 8. The aortic valve is normal in structure. Aortic valve regurgitation was not visualized by color flow Doppler. Structurally normal aortic valve, with no evidence of sclerosis or stenosis. 9. The pulmonic valve was normal in structure. Pulmonic valve regurgitation is not visualized by color flow Doppler. 10. The inferior vena cava is normal in size with greater than 50% respiratory variability, suggesting right atrial pressure of 3 mmHg.  He has been followed in EP clinic for AFib.  Last visit plan was: "S/p afib ablation 11/27/19. Patient appears to be maintaining SR. Continue flecainide 150 mg BID Continue Toprol to 100 mg daily. Continue diltiazem 30 mg PRN q 4 hours for heart racing. Continue Xarelto 20 mg daily with no missed doses for at least 3 months post ablation. Lifestyle modification was discussed and encouraged including smoking cessation and weight reduction."    Past Medical History:  Diagnosis Date  . Diabetes mellitus   . GERD (gastroesophageal reflux disease)   . History of alcohol abuse   . History of cocaine use   . History of echocardiogram    a. Echo 4/14: Moderate LVH, vigorous LVEF, EF 65-70%, normal wall motion, grade 2 diastolic dysfunction, mildly dilated aortic root and ascending aorta, ascending aorta 40 mm, aortic root 38 mm, mild LAE  .  Hx of cardiovascular stress test    a. GXT 5/14: No ischemic changes  //  b. ETT-Myoview 3/16:  Low risk, no ischemia, EF 58%  . Hyperlipidemia   . Hypertension   . Morbid obesity (HCC)   . Paroxysmal atrial fibrillation (HCC)    Occurring in 2008, with several recurrence since then (including in the setting of + cocaine on UDS).  . Sleep  apnea    uses CPAP  . SVT (supraventricular tachycardia) (HCC)    mid to long RP SVT 09/2019    Past Surgical History:  Procedure Laterality Date  . APPENDECTOMY    . ATRIAL FIBRILLATION ABLATION N/A 11/27/2019   Procedure: ATRIAL FIBRILLATION ABLATION;  Surgeon: [name], MD;  Location: [facility];  Service: Cardiovascular;  Laterality: N/A;  . ROTATOR CUFF REPAIR    . SVT ABLATION N/A 11/27/2019   Procedure: SVT ABLATION;  Surgeon: [name], MD;  Location: [facility];  Service: Cardiovascular;  Laterality: N/A;  . TONSILLECTOMY       Current Outpatient Medications  Medication Sig Dispense Refill  . atorvastatin (LIPITOR) 10 MG tablet Take 10 mg by mouth daily.    . Blood Pressure Monitoring (BLOOD PRESSURE MONITOR/L CUFF) MISC 1 Units by Does not apply route daily. 1 each 0  . diltiazem (CARDIZEM) 30 MG tablet Take 1 Tablet Every 4 Hours As Needed For HR >100 45 tablet 2  . flecainide (TAMBOCOR) 100 MG tablet Take 1.5 tablets (150 mg total) by mouth 2 (two) times daily. 90 tablet 3  . glipiZIDE (GLUCOTROL) 10 MG tablet Take 10 mg by mouth 2 (two) times daily.    Marland Kitchen lisinopril (ZESTRIL) 40 MG tablet Take 40 mg by mouth daily.    . metFORMIN (GLUCOPHAGE) 500 MG tablet Take 1,000 mg by mouth daily with breakfast.     . metoprolol succinate (TOPROL-XL) 100 MG 24 hr tablet TAKE 1 TABLET BY MOUTH EVERY DAY 30 tablet 6  . pantoprazole (PROTONIX) 40 MG tablet TAKE 1 TABLET BY MOUTH EVERY DAY 30 tablet 5  . XARELTO 20 MG TABS tablet TAKE 1 TABLET (20 MG TOTAL) BY MOUTH DAILY WITH SUPPER. 30 tablet 5   No current facility-administered medications for this visit.    Allergies:   Shrimp [shellfish allergy], Banana, Watermelon flavor, Peanut-containing drug products, Almond oil, and Sulfa antibiotics    Social History:  The patient  reports that he has been smoking cigarettes. He has a 14.00 pack-year smoking history. He has quit using smokeless tobacco.  His  smokeless tobacco use included snuff. He reports previous alcohol use of about 6.0 standard drinks of alcohol per week. He reports that he does not use drugs.   Family History:  The patient's ***family history includes Diabetes in his mother and paternal grandfather; Heart attack (age of onset: 28) in his father; Stroke in his maternal grandmother and paternal grandmother.    ROS:  Please see the history of present illness.   Otherwise, review of systems are positive for ***.   All other systems are reviewed and negative.    PHYSICAL EXAM: VS:  There were no vitals taken for this visit. , BMI There is no height or weight on file to calculate BMI. GEN: Well nourished, well developed, in no acute distress  HEENT: normal  Neck: no JVD, carotid bruits, or masses Cardiac: ***RRR; no murmurs, rubs, or gallops,no edema  Respiratory:  clear to auscultation bilaterally, normal work of breathing GI: soft, nontender,  nondistended, + BS MS: no deformity or atrophy  Skin: warm and dry, no rash Neuro:  Strength and sensation are intact Psych: euthymic mood, full affect   EKG:   The ekg ordered today demonstrates ***   Recent Labs: 09/29/2019: B Natriuretic Peptide 222.2 09/30/2019: Magnesium 1.9; TSH 2.069 11/27/2019: Hemoglobin 12.9; Platelets 289 01/06/2020: ALT 26; BUN 17; Creatinine, Ser 1.25; Potassium 5.0; Sodium 137   Lipid Panel    Component Value Date/Time   CHOL 137 01/22/2019 0921   TRIG 133 01/22/2019 0921   HDL 29 (L) 01/22/2019 0921   CHOLHDL 4.7 01/22/2019 0921   VLDL 27 01/22/2019 0921   LDLCALC 81 01/22/2019 0921   LDLDIRECT 103.0 12/08/2016 0852     Other studies Reviewed: Additional studies/ records that were reviewed today with results demonstrating: ***.   ASSESSMENT AND PLAN:  1. Chronic atrial fibrillation:  2. Obesity: 3. OSA: 4. HTN: 5. Chronic diastolic heart failure: 6. Anticoagulated: 7. DM: A1C was > 11 in 11/2018.    Current medicines are  reviewed at length with the patient today.  The patient concerns regarding his medicines were addressed.  The following changes have been made:  No change***  Labs/ tests ordered today include: *** No orders of the defined types were placed in this encounter.   Recommend 150 minutes/week of aerobic exercise Low fat, low carb, high fiber diet recommended  Disposition:   FU in ***   Signed, Lance Muss, MD  02/05/2020 8:20 AM    [facility] [address] [place], [place], [place]  [address] Phone: [phone]; Fax: [phone]

## 2020-02-06 ENCOUNTER — Ambulatory Visit: Payer: 59 | Admitting: Interventional Cardiology

## 2020-02-11 ENCOUNTER — Ambulatory Visit: Payer: 59 | Admitting: Podiatry

## 2020-02-11 ENCOUNTER — Other Ambulatory Visit: Payer: Self-pay

## 2020-02-11 VITALS — BP 183/102 | HR 71

## 2020-02-11 DIAGNOSIS — M2141 Flat foot [pes planus] (acquired), right foot: Secondary | ICD-10-CM | POA: Diagnosis not present

## 2020-02-11 DIAGNOSIS — L853 Xerosis cutis: Secondary | ICD-10-CM | POA: Diagnosis not present

## 2020-02-11 DIAGNOSIS — M2012 Hallux valgus (acquired), left foot: Secondary | ICD-10-CM

## 2020-02-11 DIAGNOSIS — E119 Type 2 diabetes mellitus without complications: Secondary | ICD-10-CM

## 2020-02-11 DIAGNOSIS — E1165 Type 2 diabetes mellitus with hyperglycemia: Secondary | ICD-10-CM

## 2020-02-11 DIAGNOSIS — M2142 Flat foot [pes planus] (acquired), left foot: Secondary | ICD-10-CM

## 2020-02-11 DIAGNOSIS — M2011 Hallux valgus (acquired), right foot: Secondary | ICD-10-CM

## 2020-02-11 NOTE — Patient Instructions (Addendum)
Moisturize feet once daily; do not apply between toes: Vaseline Intensive Care Lotion Lubriderm Lotion Gold Bond Diabetic Foot Lotion Eucerin Intensive Repair Moisturizing Lotion  If you have problems reaching your feet:  Aquaphor Advanced Therapy Ointment Body Spray Vaseline Intensive Care Spray Lotion Advanced Repair    Peripheral Neuropathy Peripheral neuropathy is a type of nerve damage. It affects nerves that carry signals between the spinal cord and the arms, legs, and the rest of the body (peripheral nerves). It does not affect nerves in the spinal cord or brain. In peripheral neuropathy, one nerve or a group of nerves may be damaged. Peripheral neuropathy is a broad category that includes many specific nerve disorders, like diabetic neuropathy, hereditary neuropathy, and carpal tunnel syndrome. What are the causes? This condition may be caused by:  Diabetes. This is the most common cause of peripheral neuropathy.  Nerve injury.  Pressure or stress on a nerve that lasts a long time.  Lack (deficiency) of B vitamins. This can result from alcoholism, poor diet, or a restricted diet.  Infections.  Autoimmune diseases, such as rheumatoid arthritis and systemic lupus erythematosus.  Nerve diseases that are passed from parent to child (inherited).  Some medicines, such as cancer medicines (chemotherapy).  Poisonous (toxic) substances, such as lead and mercury.  Too little blood flowing to the legs.  Kidney disease.  Thyroid disease. In some cases, the cause of this condition is not known. What are the signs or symptoms? Symptoms of this condition depend on which of your nerves is damaged. Common symptoms include:  Loss of feeling (numbness) in the feet, hands, or both.  Tingling in the feet, hands, or both.  Burning pain.  Very sensitive skin.  Weakness.  Not being able to move a part of the body (paralysis).  Muscle twitching.  Clumsiness or poor  coordination.  Loss of balance.  Not being able to control your bladder.  Feeling dizzy.  Sexual problems. How is this diagnosed? Diagnosing and finding the cause of peripheral neuropathy can be difficult. Your health care provider will take your medical history and do a physical exam. A neurological exam will also be done. This involves checking things that are affected by your brain, spinal cord, and nerves (nervous system). For example, your health care provider will check your reflexes, how you move, and what you can feel. You may have other tests, such as:  Blood tests.  Electromyogram (EMG) and nerve conduction tests. These tests check nerve function and how well the nerves are controlling the muscles.  Imaging tests, such as CT scans or MRI to rule out other causes of your symptoms.  Removing a small piece of nerve to be examined in a lab (nerve biopsy). This is rare.  Removing and examining a small amount of the fluid that surrounds the brain and spinal cord (lumbar puncture). This is rare. How is this treated? Treatment for this condition may involve:  Treating the underlying cause of the neuropathy, such as diabetes, kidney disease, or vitamin deficiencies.  Stopping medicines that can cause neuropathy, such as chemotherapy.  Medicine to relieve pain. Medicines may include: ? Prescription or over-the-counter pain medicine. ? Antiseizure medicine. ? Antidepressants. ? Pain-relieving patches that are applied to painful areas of skin.  Surgery to relieve pressure on a nerve or to destroy a nerve that is causing pain.  Physical therapy to help improve movement and balance.  Devices to help you move around (assistive devices). Follow these instructions at home: Medicines  Take over-the-counter and prescription medicines only as told by your health care provider. Do not take any other medicines without first asking your health care provider.  Do not drive or use heavy  machinery while taking prescription pain medicine. Lifestyle   Do not use any products that contain nicotine or tobacco, such as cigarettes and e-cigarettes. Smoking keeps blood from reaching damaged nerves. If you need help quitting, ask your health care provider.  Avoid or limit alcohol. Too much alcohol can cause a vitamin B deficiency, and vitamin B is needed for healthy nerves.  Eat a healthy diet. This includes: ? Eating foods that are high in fiber, such as fresh fruits and vegetables, whole grains, and beans. ? Limiting foods that are high in fat and processed sugars, such as fried or sweet foods. General instructions   If you have diabetes, work closely with your health care provider to keep your blood sugar under control.  If you have numbness in your feet: ? Check every day for signs of injury or infection. Watch for redness, warmth, and swelling. ? Wear padded socks and comfortable shoes. These help protect your feet.  Develop a good support system. Living with peripheral neuropathy can be stressful. Consider talking with a mental health specialist or joining a support group.  Use assistive devices and attend physical therapy as told by your health care provider. This may include using a walker or a cane.  Keep all follow-up visits as told by your health care provider. This is important. Contact a health care provider if:  You have new signs or symptoms of peripheral neuropathy.  You are struggling emotionally from dealing with peripheral neuropathy.  Your pain is not well-controlled. Get help right away if:  You have an injury or infection that is not healing normally.  You develop new weakness in an arm or leg.  You fall frequently. Summary  Peripheral neuropathy is when the nerves in the arms, or legs are damaged, resulting in numbness, weakness, or pain.  There are many causes of peripheral neuropathy, including diabetes, pinched nerves, vitamin  deficiencies, autoimmune disease, and hereditary conditions.  Diagnosing and finding the cause of peripheral neuropathy can be difficult. Your health care provider will take your medical history, do a physical exam, and do tests, including blood tests and nerve function tests.  Treatment involves treating the underlying cause of the neuropathy and taking medicines to help control pain. Physical therapy and assistive devices may also help. This information is not intended to replace advice given to you by your health care provider. Make sure you discuss any questions you have with your health care provider. Document Revised: 09/14/2017 Document Reviewed: 12/11/2016 Elsevier Patient Education  [place].  Diabetes Mellitus and [place] care is an important part of your health, especially when you have diabetes. Diabetes may cause you to have problems because of poor blood flow (circulation) to your feet and legs, which can cause your skin to:  Become thinner and drier.  Break more easily.  Heal more slowly.  Peel and crack. You may also have nerve damage (neuropathy) in your legs and feet, causing decreased feeling in them. This means that you may not notice minor injuries to your feet that could lead to more serious problems. Noticing and addressing any potential problems early is the best way to prevent future foot problems. How to care for your feet Foot hygiene  Wash your feet daily with warm water and mild soap. Do  not use hot water. Then, pat your feet and the areas between your toes until they are completely dry. Do not soak your feet as this can dry your skin.  Trim your toenails straight across. Do not dig under them or around the cuticle. File the edges of your nails with an emery board or nail file.  Apply a moisturizing lotion or petroleum jelly to the skin on your feet and to dry, brittle toenails. Use lotion that does not contain alcohol and is unscented. Do not  apply lotion between your toes. Shoes and socks  Wear clean socks or stockings every day. Make sure they are not too tight. Do not wear knee-high stockings since they may decrease blood flow to your legs.  Wear shoes that fit properly and have enough cushioning. Always look in your shoes before you put them on to be sure there are no objects inside.  To break in new shoes, wear them for just a few hours a day. This prevents injuries on your feet. Wounds, scrapes, corns, and calluses  Check your feet daily for blisters, cuts, bruises, sores, and redness. If you cannot see the bottom of your feet, use a mirror or ask someone for help.  Do not cut corns or calluses or try to remove them with medicine.  If you find a minor scrape, cut, or break in the skin on your feet, keep it and the skin around it clean and dry. You may clean these areas with mild soap and water. Do not clean the area with peroxide, alcohol, or iodine.  If you have a wound, scrape, corn, or callus on your foot, look at it several times a day to make sure it is healing and not infected. Check for: ? Redness, swelling, or pain. ? Fluid or blood. ? Warmth. ? Pus or a bad smell. General instructions  Do not cross your legs. This may decrease blood flow to your feet.  Do not use heating pads or hot water bottles on your feet. They may burn your skin. If you have lost feeling in your feet or legs, you may not know this is happening until it is too late.  Protect your feet from hot and cold by wearing shoes, such as at the beach or on hot pavement.  Schedule a complete foot exam at least once a year (annually) or more often if you have foot problems. If you have foot problems, report any cuts, sores, or bruises to your health care provider immediately. Contact a health care provider if:  You have a medical condition that increases your risk of infection and you have any cuts, sores, or bruises on your feet.  You have an  injury that is not healing.  You have redness on your legs or feet.  You feel burning or tingling in your legs or feet.  You have pain or cramps in your legs and feet.  Your legs or feet are numb.  Your feet always feel cold.  You have pain around a toenail. Get help right away if:  You have a wound, scrape, corn, or callus on your foot and: ? You have pain, swelling, or redness that gets worse. ? You have fluid or blood coming from the wound, scrape, corn, or callus. ? Your wound, scrape, corn, or callus feels warm to the touch. ? You have pus or a bad smell coming from the wound, scrape, corn, or callus. ? You have a fever. ? You have a  red line going up your leg. Summary  Check your feet every day for cuts, sores, red spots, swelling, and blisters.  Moisturize feet and legs daily.  Wear shoes that fit properly and have enough cushioning.  If you have foot problems, report any cuts, sores, or bruises to your health care provider immediately.  Schedule a complete foot exam at least once a year (annually) or more often if you have foot problems. This information is not intended to replace advice given to you by your health care provider. Make sure you discuss any questions you have with your health care provider. Document Revised: 06/25/2019 Document Reviewed: 11/03/2016 Elsevier Patient Education  2020 [employer].

## 2020-02-11 NOTE — Progress Notes (Signed)
Subjective: [name] presents today referred by Seward Carol, MD for diabetic foot evaluation.  Patient relates 21 year history of diabetes.  Patient denies any history of foot wounds.  Patient admits history of numbness, tingling, and burning in feet. Tingling symptoms have improved with medication.  He also relates losing sensation in his feet when he it sitting on the toilet.  Past Medical History:  Diagnosis Date  . Diabetes mellitus   . GERD (gastroesophageal reflux disease)   . History of alcohol abuse   . History of cocaine use   . History of echocardiogram    a. Echo 4/14: Moderate LVH, vigorous LVEF, EF 65-70%, normal wall motion, grade 2 diastolic dysfunction, mildly dilated aortic root and ascending aorta, ascending aorta 40 mm, aortic root 38 mm, mild LAE  . Hx of cardiovascular stress test    a. GXT 5/14: No ischemic changes  //  b. ETT-Myoview 3/16:  Low risk, no ischemia, EF 58%  . Hyperlipidemia   . Hypertension   . Morbid obesity ([place])   . Paroxysmal atrial fibrillation ([place])    Occurring in 2008, with several recurrence since then (including in the setting of + cocaine on UDS).  . Sleep apnea    uses CPAP  . SVT (supraventricular tachycardia) ([place])    mid to long RP SVT 09/2019    Patient Active Problem List   Diagnosis Date Noted  . Mediastinal adenopathy 01/06/2020  . Secondary hypercoagulable state ([place]) 10/22/2019  . Palpitations 09/30/2019  . Acute respiratory failure with hypoxia ([place]) 09/30/2019  . SVT (supraventricular tachycardia) ([place]) 07/08/2019  . Acute pancreatitis 01/22/2019  . Class 3 obesity due to excess calories with serious comorbidity and body mass index (BMI) of 40.0 to 44.9 in adult 12/08/2016  . Paroxysmal atrial fibrillation ([place]) 01/23/2013  . Diabetes mellitus ([place]) 01/23/2013  . Hypertensive heart disease 01/23/2013  . Tobacco user 01/23/2013  . Chronic diastolic heart failure ([place]) 01/21/2013  . Community  acquired pneumonia 01/20/2013  . 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 Shoulder pain, right 02/15/2011  . Seasonal and perennial allergic rhinitis 02/13/2008  . Dyslipidemia 02/12/2008  . OBESITY, MORBID 02/12/2008  . COCAINE Abuse, past hx 02/12/2008  . Obstructive sleep apnea 02/12/2008    Past Surgical History:  Procedure Laterality Date  . APPENDECTOMY    . ATRIAL FIBRILLATION ABLATION N/A 11/27/2019   Procedure: ATRIAL FIBRILLATION ABLATION;  Surgeon: Thompson Grayer, MD;  Location: [facility];  Service: Cardiovascular;  Laterality: N/A;  . ROTATOR CUFF REPAIR    . SVT ABLATION N/A 11/27/2019   Procedure: SVT ABLATION;  Surgeon: Thompson Grayer, MD;  Location: [facility];  Service: Cardiovascular;  Laterality: N/A;  . TONSILLECTOMY      Current Outpatient Medications on File Prior to Visit  Medication Sig Dispense Refill  . atorvastatin (LIPITOR) 10 MG tablet Take 10 mg by mouth daily.    . Blood Pressure Monitoring (BLOOD PRESSURE MONITOR/L CUFF) MISC 1 Units by Does not apply route daily. 1 each 0  . diltiazem (CARDIZEM) 30 MG tablet Take 1 Tablet Every 4 Hours As Needed For HR >100 45 tablet 2  . flecainide (TAMBOCOR) 100 MG tablet Take 1.5 tablets (150 mg total) by mouth 2 (two) times daily. 90 tablet 3  . glipiZIDE (GLUCOTROL) 10 MG tablet Take 10 mg by mouth 2 (two) times daily.    Marland Kitchen lisinopril (ZESTRIL) 40 MG tablet Take 40 mg by mouth daily.    Marland Kitchen  metFORMIN (GLUCOPHAGE) 500 MG tablet Take 1,000 mg by mouth daily with breakfast.     . metoprolol succinate (TOPROL-XL) 100 MG 24 hr tablet TAKE 1 TABLET BY MOUTH EVERY DAY 30 tablet 6  . pantoprazole (PROTONIX) 40 MG tablet TAKE 1 TABLET BY MOUTH EVERY DAY 30 tablet 5  . XARELTO 20 MG TABS tablet TAKE 1 TABLET (20 MG TOTAL) BY MOUTH DAILY WITH SUPPER. 30 tablet 5   No current facility-administered medications on file prior to visit.     Allergies  Allergen Reactions  . Shrimp [Shellfish Allergy] Anaphylaxis   . Banana Other (See Comments)    Pt gags  . Watermelon Flavor Nausea And Vomiting  . Peanut-Containing Drug Products Itching and Cough  . Almond Oil Itching    Roof of mouth itches  . Sulfa Antibiotics Itching    Social History   Occupational History  . Occupation: Designer, industrial/product: UNEMPLOYED  Tobacco Use  . Smoking status: Current Every Day Smoker    Packs/day: 0.50    Years: 28.00    Pack years: 14.00    Types: Cigarettes  . Smokeless tobacco: Former Neurosurgeon    Types: Snuff  . Tobacco comment: smokes one pack per day 01/06/2020  Substance and Sexual Activity  . Alcohol use: Not Currently    Alcohol/week: 6.0 standard drinks    Types: 6 Standard drinks or equivalent per week    Comment: monthly  . Drug use: No    Comment: cocaine in the past, none currently  . Sexual activity: Not on file    Family History  Problem Relation Age of Onset  . Diabetes Mother   . Heart attack Father 65  . Stroke Maternal Grandmother   . Stroke Paternal Grandmother   . Diabetes Paternal Grandfather     Immunization History  Administered Date(s) Administered  . Influenza Inj Mdck Quad Pf 08/19/2017  . Influenza Split 07/01/2011, 07/16/2013, 07/16/2014  . Influenza Whole 08/21/2012  . Influenza,inj,Quad PF,6+ Mos 09/10/2015  . Influenza-Unspecified 08/16/2017, 12/12/2018  . Pneumococcal Polysaccharide-23 07/16/2014  . Tdap 08/31/2011    Review of systems: Positive Findings in bold print.  Constitutional:  chills, fatigue, fever, sweats, weight change Communication: Nurse, learning disability, sign Presenter, broadcasting, hand writing, iPad/Android device Head: headaches, head injury Eyes: changes in vision, eye pain, glaucoma, cataracts, macular degeneration, diplopia, glare,  light sensitivity, eyeglasses or contacts, blindness Ears nose mouth throat: hearing impaired, hearing aids,  ringing in ears, deaf, sign language,  vertigo, nosebleeds,  rhinitis,  cold sores, snoring, swollen  glands Cardiovascular: HTN, edema, arrhythmia, pacemaker in place, defibrillator in place, chest pain/tightness, chronic anticoagulation, blood clot, heart failure, MI Peripheral Vascular: leg cramps, varicose veins, blood clots, lymphedema, varicosities Respiratory:  difficulty breathing, denies congestion, SOB, wheezing, cough, emphysema Gastrointestinal: change in appetite or weight, abdominal pain, constipation, diarrhea, nausea, vomiting, vomiting blood, change in bowel habits, abdominal pain, jaundice, rectal bleeding, hemorrhoids, GERD Genitourinary:  nocturia,  pain on urination, polyuria,  blood in urine, Foley catheter, urinary urgency, ESRD on hemodialysis Musculoskeletal: amputation, cramping, stiff joints, painful joints, decreased joint motion, fractures, OA, gout, hemiplegia, paraplegia, uses cane, wheelchair bound, uses walker, uses rollator Skin: +changes in toenails, color change, dryness, itching, mole changes,  rash, wound(s) Neurological: headaches, numbness in feet, paresthesias in feet, burning in feet, fainting,  seizures, change in speech,  headaches, memory problems/poor historian, cerebral palsy, weakness, paralysis, CVA, TIA Endocrine: diabetes, hypothyroidism, hyperthyroidism,  goiter, dry mouth, flushing, heat intolerance,  cold intolerance,  excessive thirst, denies polyuria,  nocturia Hematological:  easy bleeding, excessive bleeding, easy bruising, enlarged lymph nodes, on long term blood thinner, history of past transusions Allergy/immunological:  hives, eczema, frequent infections, multiple drug allergies, seasonal allergies, transplant recipient, multiple food allergies Psychiatric:  anxiety, depression, mood disorder, suicidal ideations, hallucinations, insomnia  Objective: Vitals:   02/11/20 0936  BP: (!) 183/102  Pulse: 71    49 y.o. AA  male morbidly obese,  IN NAD. AAO X 3.  Vascular Examination: Capillary fill time to digits <3 seconds b/l. Palpable  DP pulses b/l. Palpable PT pulses b/l. Pedal hair sparse b/l. Skin temperature gradient within normal limits b/l. No edema noted b/l.  Dermatological Examination: Pedal skin with normal turgor, texture and tone bilaterally. No open wounds bilaterally. No interdigital macerations bilaterally. Toenails 1-5 b/l well maintained. No erythema, no edema, no drainage. No signs of infection. Pedal skin noted to be dry and flaky b/l.  Musculoskeletal Examination: Normal muscle strength 5/5 to all lower extremity muscle groups bilaterally. No pain crepitus or joint limitation noted with ROM b/l. Hallux valgus with bunion deformity noted b/l. Pes planus deformity noted b/l.  Patient ambulates independent of any assistive aids.  Neurological Examination: Protective sensation intact 5/5 intact bilaterally with 10g monofilament b/l. Vibratory sensation intact b/l. Proprioception intact bilaterally. Babinski reflex negative b/l. Clonus negative b/l.  Assessment: 1. Hallux valgus, acquired, bilateral   2. Xerosis cutis   3. Pes planus of both feet   4. Encounter for diabetic foot exam (HCC)   5. Type 2 diabetes mellitus with hyperglycemia, without long-term current use of insulin (HCC)     Plan: -Diabetic foot examination performed on today's visit. -Discussed diabetic foot care principles. Literature dispensed on today. -Patient to continue soft, supportive shoe gear daily. -Patient to report any pedal injuries to medical professional immediately. -Discussed OTC moisturizers to use. -Patient/POA to call should there be question/concern in the interim.  Return in about 3 months (around 05/12/2020) for diabetic nail trim.

## 2020-02-15 ENCOUNTER — Encounter: Payer: Self-pay | Admitting: Podiatry

## 2020-02-25 ENCOUNTER — Ambulatory Visit: Payer: 59 | Admitting: Internal Medicine

## 2020-03-08 ENCOUNTER — Other Ambulatory Visit: Payer: Self-pay

## 2020-03-08 ENCOUNTER — Telehealth (INDEPENDENT_AMBULATORY_CARE_PROVIDER_SITE_OTHER): Payer: 59 | Admitting: Internal Medicine

## 2020-03-08 DIAGNOSIS — I480 Paroxysmal atrial fibrillation: Secondary | ICD-10-CM

## 2020-03-08 NOTE — Progress Notes (Signed)
No show for virtual visit.  Will reschedule for in office visit

## 2020-03-08 NOTE — Progress Notes (Deleted)
Cardiology Office Note   Date:  03/08/2020   ID:  [name], DOB [birth year], MRN [id number]  PCP:  Seward Carol, MD    No chief complaint on file.  AFib  Wt Readings from Last 3 Encounters:  01/06/20 (!) 314 lb 9.6 oz (142.7 kg)  12/25/19 (!) 313 lb 6.4 oz (142.2 kg)  11/27/19 (!) 304 lb (137.9 kg)       History of Present Illness: [name] is a 49 y.o. male  has a hx of HTN, DM2, obesity, OSA on CPAP, prior ETOH/cocaine use and PAF. Remotely seen by Dr. Verlon Setting in the past. He was evaluated by Dr. Liam Rogers in 4/14 in the hospital during an admission with PAF with RVR.Ritalin was adrug tobe avoided in the futuresince the AFib started with medicine. He converted to NSR and long-term anticoagulation was recommended given his stroke risk. Flecainide was added to maintain NSR. He was to follow up in this office for ETT to rule out pro-arrhythmia, but was not seen here until 2/16. Review of his chart indicates he had an ETT at [facility] in 02/2013. He was placed back on Flecainide and FU ETT-Myoview was low risk and neg for pro-arrhythmia.   CHADS2-VASc=2 (HTN, DM)  Patient had been taking Adderall for narcolepsy. Now retired from his job with the city due to narcolepsy.  The patient does not have symptoms concerning for COVID-19 infection (fever, chills, cough, or new shortness of breath).      Past Medical History:  Diagnosis Date  . Diabetes mellitus   . GERD (gastroesophageal reflux disease)   . History of alcohol abuse   . History of cocaine use   . History of echocardiogram    a. Echo 4/14: Moderate LVH, vigorous LVEF, EF 65-70%, normal wall motion, grade 2 diastolic dysfunction, mildly dilated aortic root and ascending aorta, ascending aorta 40 mm, aortic root 38 mm, mild LAE  . Hx of cardiovascular stress test    a. GXT 5/14: No ischemic changes  //  b. ETT-Myoview 3/16:  Low risk, no ischemia, EF 58%  . Hyperlipidemia   .  Hypertension   . Morbid obesity ([place])   . Paroxysmal atrial fibrillation ([place])    Occurring in 2008, with several recurrence since then (including in the setting of + cocaine on UDS).  . Sleep apnea    uses CPAP  . SVT (supraventricular tachycardia) ([place])    mid to long RP SVT 09/2019    Past Surgical History:  Procedure Laterality Date  . APPENDECTOMY    . ATRIAL FIBRILLATION ABLATION N/A 11/27/2019   Procedure: ATRIAL FIBRILLATION ABLATION;  Surgeon: Thompson Grayer, MD;  Location: [place] [facility];  Service: Cardiovascular;  Laterality: N/A;  . ROTATOR CUFF REPAIR    . SVT ABLATION N/A 11/27/2019   Procedure: SVT ABLATION;  Surgeon: Thompson Grayer, MD;  Location: [place] [facility];  Service: Cardiovascular;  Laterality: N/A;  . TONSILLECTOMY       Current Outpatient Medications  Medication Sig Dispense Refill  . atorvastatin (LIPITOR) 10 MG tablet Take 10 mg by mouth daily.    . Blood Pressure Monitoring (BLOOD PRESSURE MONITOR/L CUFF) MISC 1 Units by Does not apply route daily. 1 each 0  . diltiazem (CARDIZEM) 30 MG tablet Take 1 Tablet Every 4 Hours As Needed For HR >100 45 tablet 2  . flecainide (TAMBOCOR) 100 MG tablet Take 1.5 tablets (150 mg total) by mouth 2 (two) times daily. [place]  tablet 3  . glipiZIDE (GLUCOTROL) 10 MG tablet Take 10 mg by mouth 2 (two) times daily.    Marland Kitchen lisinopril (ZESTRIL) 40 MG tablet Take 40 mg by mouth daily.    . metFORMIN (GLUCOPHAGE) 500 MG tablet Take 1,000 mg by mouth daily with breakfast.     . metoprolol succinate (TOPROL-XL) 100 MG 24 hr tablet TAKE 1 TABLET BY MOUTH EVERY DAY 30 tablet 6  . pantoprazole (PROTONIX) 40 MG tablet TAKE 1 TABLET BY MOUTH EVERY DAY 30 tablet 5  . XARELTO 20 MG TABS tablet TAKE 1 TABLET (20 MG TOTAL) BY MOUTH DAILY WITH SUPPER. 30 tablet 5   No current facility-administered medications for this visit.    Allergies:   Shrimp [shellfish allergy], Banana, Watermelon flavor, Peanut-containing drug products, Almond  oil, and Sulfa antibiotics    Social History:  The patient  reports that he has been smoking cigarettes. He has a 14.00 pack-year smoking history. He has quit using smokeless tobacco.  His smokeless tobacco use included snuff. He reports previous alcohol use of about 6.0 standard drinks of alcohol per week. He reports that he does not use drugs.   Family History:  The patient's ***family history includes Diabetes in his mother and paternal grandfather; Heart attack (age of onset: 31) in his father; Stroke in his maternal grandmother and paternal grandmother.    ROS:  Please see the history of present illness.   Otherwise, review of systems are positive for ***.   All other systems are reviewed and negative.    PHYSICAL EXAM: VS:  There were no vitals taken for this visit. , BMI There is no height or weight on file to calculate BMI. GEN: Well nourished, well developed, in no acute distress HEENT: normal Neck: no JVD, carotid bruits, or masses Cardiac: ***RRR; no murmurs, rubs, or gallops,no edema  Respiratory:  clear to auscultation bilaterally, normal work of breathing GI: soft, nontender, nondistended, + BS MS: no deformity or atrophy Skin: warm and dry, no rash Neuro:  Strength and sensation are intact Psych: euthymic mood, full affect   EKG:   The ekg ordered today demonstrates ***   Recent Labs: 09/29/2019: B Natriuretic Peptide 222.2 09/30/2019: Magnesium 1.9; TSH 2.069 11/27/2019: Hemoglobin 12.9; Platelets 289 01/06/2020: ALT 26; BUN 17; Creatinine, Ser 1.25; Potassium 5.0; Sodium 137   Lipid Panel    Component Value Date/Time   CHOL 137 01/22/2019 0921   TRIG 133 01/22/2019 0921   HDL 29 (L) 01/22/2019 0921   CHOLHDL 4.7 01/22/2019 0921   VLDL 27 01/22/2019 0921   LDLCALC 81 01/22/2019 0921   LDLDIRECT 103.0 12/08/2016 0852     Other studies Reviewed: Additional studies/ records that were reviewed today with results demonstrating: ***.   ASSESSMENT AND  PLAN:  1. AFib:  2. Anticoagulated: 3. Hypertensive heart disease: 4. Diabetes: 5. Tobacco abuse:   Current medicines are reviewed at length with the patient today.  The patient concerns regarding his medicines were addressed.  The following changes have been made:  No change***  Labs/ tests ordered today include: *** No orders of the defined types were placed in this encounter.   Recommend 150 minutes/week of aerobic exercise Low fat, low carb, high fiber diet recommended  Disposition:   FU in ***   Signed, Lance Muss, MD  03/08/2020 12:53 PM    [facility] [address] [place], [place], [place]  [address] Phone: [phone]; Fax: [phone]

## 2020-03-10 ENCOUNTER — Encounter: Payer: Self-pay | Admitting: Internal Medicine

## 2020-03-10 NOTE — Telephone Encounter (Signed)
Error

## 2020-03-12 ENCOUNTER — Ambulatory Visit: Payer: 59 | Admitting: Interventional Cardiology

## 2020-03-19 ENCOUNTER — Telehealth: Payer: Self-pay | Admitting: Internal Medicine

## 2020-03-19 MED ORDER — AMPHETAMINE-DEXTROAMPHETAMINE 30 MG PO TABS: 30.0000 mg | ORAL_TABLET | Freq: Every day | ORAL | 0 refills | Status: DC

## 2020-03-19 NOTE — Telephone Encounter (Signed)
[facility] Rx [facility]sent to [facility] [address]

## 2020-03-19 NOTE — Telephone Encounter (Signed)
Rx refill for Adderall 30mg  XR. I called pt and asked him when the last time he was taking Adderall. He stated he stopped taking it but would like to go back on it because he started a new job recently. Does he need an appt to restart medication. He stated CY advised him when he was ready to restart, he could call. Please advise.   Current Outpatient Medications on File Prior to Visit  Medication Sig Dispense Refill  . atorvastatin (LIPITOR) 10 MG tablet Take 10 mg by mouth daily.    . Blood Pressure Monitoring (BLOOD PRESSURE MONITOR/L CUFF) MISC 1 Units by Does not apply route daily. 1 each 0  . diltiazem (CARDIZEM) 30 MG tablet Take 1 Tablet Every 4 Hours As Needed For HR >100 45 tablet 2  . flecainide (TAMBOCOR) 100 MG tablet Take 1.5 tablets (150 mg total) by mouth 2 (two) times daily. 90 tablet 3  . glipiZIDE (GLUCOTROL) 10 MG tablet Take 10 mg by mouth 2 (two) times daily.    lisinopril (ZESTRIL) 40 MG tablet Take 40 mg by mouth daily.    . metFORMIN (GLUCOPHAGE) 500 MG tablet Take 1,000 mg by mouth daily with breakfast.     . metoprolol succinate (TOPROL-XL) 100 MG 24 hr tablet TAKE 1 TABLET BY MOUTH EVERY DAY 30 tablet 6  . pantoprazole (PROTONIX) 40 MG tablet TAKE 1 TABLET BY MOUTH EVERY DAY 30 tablet 5  . XARELTO 20 MG TABS tablet TAKE 1 TABLET (20 MG TOTAL) BY MOUTH DAILY WITH SUPPER. 30 tablet 5   No current facility-administered medications on file prior to visit.   Allergies  Allergen Reactions  . Shrimp [Shellfish Allergy] Anaphylaxis  . Banana Other (See Comments)    Pt gags  . Watermelon Flavor Nausea And Vomiting  . Peanut-Containing Drug Products Itching and Cough  . Almond Oil Itching    Roof of mouth itches  . Sulfa Antibiotics Itching

## 2020-03-26 ENCOUNTER — Telehealth: Payer: Self-pay

## 2020-03-26 NOTE — Telephone Encounter (Signed)
Left a message regarding virtual appt on 03/29/20. 

## 2020-03-29 ENCOUNTER — Telehealth (INDEPENDENT_AMBULATORY_CARE_PROVIDER_SITE_OTHER): Payer: 59 | Admitting: Internal Medicine

## 2020-03-29 ENCOUNTER — Telehealth: Payer: Self-pay

## 2020-03-29 ENCOUNTER — Other Ambulatory Visit: Payer: Self-pay

## 2020-03-29 DIAGNOSIS — I119 Hypertensive heart disease without heart failure: Secondary | ICD-10-CM | POA: Diagnosis not present

## 2020-03-29 DIAGNOSIS — G4733 Obstructive sleep apnea (adult) (pediatric): Secondary | ICD-10-CM | POA: Diagnosis not present

## 2020-03-29 DIAGNOSIS — I480 Paroxysmal atrial fibrillation: Secondary | ICD-10-CM | POA: Diagnosis not present

## 2020-03-29 MED ORDER — METOPROLOL SUCCINATE ER 50 MG PO TB24: 50.0000 mg | ORAL_TABLET | Freq: Every day | ORAL | 3 refills | Status: DC

## 2020-03-29 MED ORDER — FLECAINIDE ACETATE 100 MG PO TABS: 100.0000 mg | ORAL_TABLET | Freq: Two times a day (BID) | ORAL | 3 refills | Status: DC

## 2020-03-29 NOTE — Progress Notes (Signed)
Electrophysiology TeleHealth Note  Due to national recommendations of social distancing due to COVID 19, an audio telehealth visit is felt to be most appropriate for this patient at this time.  Verbal consent was obtained by me for the telehealth visit today.  The patient does not have capability for a virtual visit.  A phone visit is therefore required today.   Date:  03/29/2020   ID:  [name], DOB 06-Feb-1971, MRN [id number]  Location: patient's home  Provider location:  [place]  Evaluation Performed: Follow-up visit  PCP:  Renford Dills, MD   Electrophysiologist:  Dr Johney Frame  Chief Complaint:  palpitations  History of Present Illness:    [name] is a 49 y.o. male who presents via telehealth conferencing today.  Since his ablation, the patient reports doing very well.  He is pleased with results of his procedure.  No symptoms of arrhythmia. Today, he denies symptoms of palpitations, chest pain, shortness of breath,  lower extremity edema, dizziness, presyncope, or syncope.  The patient is otherwise without complaint today.     Past Medical History:  Diagnosis Date  . Diabetes mellitus   . GERD (gastroesophageal reflux disease)   . History of alcohol abuse   . History of cocaine use   . History of echocardiogram    a. Echo 4/14: Moderate LVH, vigorous LVEF, EF 65-70%, normal wall motion, grade 2 diastolic dysfunction, mildly dilated aortic root and ascending aorta, ascending aorta 40 mm, aortic root 38 mm, mild LAE  . Hx of cardiovascular stress test    a. GXT 5/14: No ischemic changes  //  b. ETT-Myoview 3/16:  Low risk, no ischemia, EF 58%  . Hyperlipidemia   . Hypertension   . Morbid obesity ([facility])   . Paroxysmal atrial fibrillation ([facility])    Occurring in 2008, with several recurrence since then (including in the setting of + cocaine on UDS).  . Sleep apnea    uses CPAP  . SVT (supraventricular tachycardia) ([facility])    mid to long RP SVT  09/2019    Past Surgical History:  Procedure Laterality Date  . APPENDECTOMY    . ATRIAL FIBRILLATION ABLATION N/A 11/27/2019   Procedure: ATRIAL FIBRILLATION ABLATION;  Surgeon: [name], MD;  Location: [facility];  Service: Cardiovascular;  Laterality: N/A;  . ROTATOR CUFF REPAIR    . SVT ABLATION N/A 11/27/2019   Procedure: SVT ABLATION;  Surgeon: [name], MD;  Location: [facility];  Service: Cardiovascular;  Laterality: N/A;  . TONSILLECTOMY      Current Outpatient Medications  Medication Sig Dispense Refill  . amphetamine-dextroamphetamine (ADDERALL) 30 MG tablet Take 1 tablet by mouth daily. 30 tablet 0  . atorvastatin (LIPITOR) 10 MG tablet Take 10 mg by mouth daily.    . Blood Pressure Monitoring (BLOOD PRESSURE MONITOR/L CUFF) MISC 1 Units by Does not apply route daily. 1 each 0  . diltiazem (CARDIZEM) 30 MG tablet Take 1 Tablet Every 4 Hours As Needed For HR >100 45 tablet 2  . flecainide (TAMBOCOR) 100 MG tablet Take 1.5 tablets (150 mg total) by mouth 2 (two) times daily. 90 tablet 3  . glipiZIDE (GLUCOTROL) 10 MG tablet Take 10 mg by mouth 2 (two) times daily.    Marland Kitchen lisinopril (ZESTRIL) 40 MG tablet Take 40 mg by mouth daily.    . metFORMIN (GLUCOPHAGE) 500 MG tablet Take 1,000 mg by mouth daily with breakfast.     .  metoprolol succinate (TOPROL-XL) 100 MG 24 hr tablet TAKE 1 TABLET BY MOUTH EVERY DAY 30 tablet 6  . pantoprazole (PROTONIX) 40 MG tablet TAKE 1 TABLET BY MOUTH EVERY DAY 30 tablet 5  . XARELTO 20 MG TABS tablet TAKE 1 TABLET (20 MG TOTAL) BY MOUTH DAILY WITH SUPPER. 30 tablet 5   No current facility-administered medications for this visit.    Allergies:   Shrimp [shellfish allergy], Banana, Watermelon flavor, Peanut-containing drug products, Almond oil, and Sulfa antibiotics   Social History:  The patient  reports that he has been smoking cigarettes. He has a 14.00 pack-year smoking history. He has quit using smokeless tobacco.  His  smokeless tobacco use included snuff. He reports previous alcohol use of about 6.0 standard drinks of alcohol per week. He reports that he does not use drugs.   ROS:  Please see the history of present illness.   All other systems are personally reviewed and negative.    Exam:    Vital Signs:  There were no vitals taken for this visit.  Well sounding, alert and conversant   Labs/Other Tests and Data Reviewed:    Recent Labs: 09/29/2019: B Natriuretic Peptide 222.2 09/30/2019: Magnesium 1.9; TSH 2.069 11/27/2019: Hemoglobin 12.9; Platelets 289 01/06/2020: ALT 26; BUN 17; Creatinine, Ser 1.25; Potassium 5.0; Sodium 137   Wt Readings from Last 3 Encounters:  01/06/20 (!) 314 lb 9.6 oz (142.7 kg)  12/25/19 (!) 313 lb 6.4 oz (142.2 kg)  11/27/19 (!) 304 lb (137.9 kg)     ASSESSMENT & PLAN:    1.  paroxymal atrial fibrillation/ atach Well controlled post ablation Reduce flecainide to 100mg  BID Reduce toprol to 50mg  daily Continue xarelto for chads2vasc score of 2 We will follow him closely on flecainide to avoid toxicity with this medicine Lifestyle modification is advised  2. Hypertensive cardiovascular disease Reduce toprol today Continue to follow  3. OSA He reports that he uses his CPAP  4. Morbid obesity We discussed lifestyle modification today   Risks, benefits and potential toxicities for medications prescribed and/or refilled reviewed with patient today.   Follow-up:   with me in 3 months   Patient Risk:  after full review of this patients clinical status, I feel that they are at moderate risk at this time.  Today, I have spent 15 minutes with the patient with telehealth technology discussing arrhythmia management .    Army Fossa, MD  03/29/2020 2:55 PM     [facility] [address] [place] [address] [phone] (office) [phone] (fax)

## 2020-03-29 NOTE — Telephone Encounter (Signed)
New prescriptions sent to pharmacy.  Advised of new dosage.  Will need 3 mo f/u

## 2020-03-29 NOTE — Telephone Encounter (Signed)
-----   Message from [name], MD sent at 03/29/2020  2:58 PM EDT ----- Reduce flecainide to 100mg  BID Reduce toprol to 50mg  daily  Follow-up with me in 3 months in offive

## 2020-04-05 ENCOUNTER — Other Ambulatory Visit: Payer: Self-pay | Admitting: Interventional Cardiology

## 2020-04-05 DIAGNOSIS — I480 Paroxysmal atrial fibrillation: Secondary | ICD-10-CM

## 2020-04-05 NOTE — Telephone Encounter (Signed)
Pt last saw Dr Johney Frame 03/29/20, last labs 01/06/20 Creat 1.25, age 49, weight 142.7kg, CrCl 122.64, based on CrCl pt is on appropriate dosage of Xarelto 20mg  QD.  Will refill rx.

## 2020-04-08 NOTE — Progress Notes (Deleted)
Cardiology Office Note   Date:  04/08/2020   ID:  [name], DOB 1971/10/14, MRN [id number]  PCP:  Seward Carol, MD    No chief complaint on file.  AFib  Wt Readings from Last 3 Encounters:  01/06/20 (!) 314 lb 9.6 oz (142.7 kg)  12/25/19 (!) 313 lb 6.4 oz (142.2 kg)  11/27/19 (!) 304 lb (137.9 kg)       History of Present Illness: Clarke Peretz is a 49 y.o. male  has a hx of HTN, DM2, obesity, OSA on CPAP, prior ETOH/cocaine use and PAF. Remotely seen by Dr. Verlon Setting in the past. He was evaluated by Dr. Liam Rogers in 4/14 in the hospital during an admission with PAF with RVR.Ritalin was adrug tobe avoided in the futuresince the AFib started with medicine. He converted to NSR and long-term anticoagulation was recommended given his stroke risk. Flecainide was added to maintain NSR. He was to follow up in this office for ETT to rule out pro-arrhythmia, but was not seen here until 2/16. Review of his chart indicates he had an ETT at [facility] in 02/2013. He was placed back on Flecainide and FU ETT-Myoview was low risk and neg for pro-arrhythmia.   CHADS2-VASc=2 (HTN, DM)  Patient had been taking Adderall for narcolepsy. Now retired from his job with the city due to narcolepsy.  The patient does not have symptoms concerning for COVID-19 infection (fever, chills, cough, or new shortness of breath).      Past Medical History:  Diagnosis Date  . Diabetes mellitus   . GERD (gastroesophageal reflux disease)   . History of alcohol abuse   . History of cocaine use   . History of echocardiogram    a. Echo 4/14: Moderate LVH, vigorous LVEF, EF 65-70%, normal wall motion, grade 2 diastolic dysfunction, mildly dilated aortic root and ascending aorta, ascending aorta 40 mm, aortic root 38 mm, mild LAE  . Hx of cardiovascular stress test    a. GXT 5/14: No ischemic changes  //  b. ETT-Myoview 3/16:  Low risk, no ischemia, EF 58%  . Hyperlipidemia   .  Hypertension   . Morbid obesity ([place])   . Paroxysmal atrial fibrillation ([place])    Occurring in 2008, with several recurrence since then (including in the setting of + cocaine on UDS).  . Sleep apnea    uses CPAP  . SVT (supraventricular tachycardia) ([place])    mid to long RP SVT 09/2019    Past Surgical History:  Procedure Laterality Date  . APPENDECTOMY    . ATRIAL FIBRILLATION ABLATION N/A 11/27/2019   Procedure: ATRIAL FIBRILLATION ABLATION;  Surgeon: Thompson Grayer, MD;  Location: [place] [facility];  Service: Cardiovascular;  Laterality: N/A;  . ROTATOR CUFF REPAIR    . SVT ABLATION N/A 11/27/2019   Procedure: SVT ABLATION;  Surgeon: Thompson Grayer, MD;  Location: [place] [facility];  Service: Cardiovascular;  Laterality: N/A;  . TONSILLECTOMY       Current Outpatient Medications  Medication Sig Dispense Refill  . amphetamine-dextroamphetamine (ADDERALL) 30 MG tablet Take 1 tablet by mouth daily. 30 tablet 0  . atorvastatin (LIPITOR) 10 MG tablet Take 10 mg by mouth daily.    . Blood Pressure Monitoring (BLOOD PRESSURE MONITOR/L CUFF) MISC 1 Units by Does not apply route daily. 1 each 0  . diltiazem (CARDIZEM) 30 MG tablet Take 1 Tablet Every 4 Hours As Needed For HR >100 45 tablet 2  . flecainide (TAMBOCOR)  100 MG tablet Take 1 tablet (100 mg total) by mouth 2 (two) times daily. 180 tablet 3  . glipiZIDE (GLUCOTROL) 10 MG tablet Take 10 mg by mouth 2 (two) times daily.    Marland Kitchen lisinopril (ZESTRIL) 40 MG tablet Take 40 mg by mouth daily.    . metFORMIN (GLUCOPHAGE) 500 MG tablet Take 1,000 mg by mouth daily with breakfast.     . metoprolol succinate (TOPROL-XL) 50 MG 24 hr tablet Take 1 tablet (50 mg total) by mouth daily. Take with or immediately following a meal. 90 tablet 3  . pantoprazole (PROTONIX) 40 MG tablet TAKE 1 TABLET BY MOUTH EVERY DAY 30 tablet 5  . XARELTO 20 MG TABS tablet TAKE 1 TABLET (20 MG TOTAL) BY MOUTH DAILY WITH SUPPER. 30 tablet 6   No current  facility-administered medications for this visit.    Allergies:   Shrimp [shellfish allergy], Banana, Watermelon flavor, Peanut-containing drug products, Almond oil, and Sulfa antibiotics    Social History:  The patient  reports that he has been smoking cigarettes. He has a 14.00 pack-year smoking history. He has quit using smokeless tobacco.  His smokeless tobacco use included snuff. He reports previous alcohol use of about 6.0 standard drinks of alcohol per week. He reports that he does not use drugs.   Family History:  The patient's ***family history includes Diabetes in his mother and paternal grandfather; Heart attack (age of onset: 103) in his father; Stroke in his maternal grandmother and paternal grandmother.    ROS:  Please see the history of present illness.   Otherwise, review of systems are positive for ***.   All other systems are reviewed and negative.    PHYSICAL EXAM: VS:  There were no vitals taken for this visit. , BMI There is no height or weight on file to calculate BMI. GEN: Well nourished, well developed, in no acute distress HEENT: normal Neck: no JVD, carotid bruits, or masses Cardiac: ***RRR; no murmurs, rubs, or gallops,no edema  Respiratory:  clear to auscultation bilaterally, normal work of breathing GI: soft, nontender, nondistended, + BS MS: no deformity or atrophy Skin: warm and dry, no rash Neuro:  Strength and sensation are intact Psych: euthymic mood, full affect   EKG:   The ekg ordered today demonstrates ***   Recent Labs: 09/29/2019: B Natriuretic Peptide 222.2 09/30/2019: Magnesium 1.9; TSH 2.069 11/27/2019: Hemoglobin 12.9; Platelets 289 01/06/2020: ALT 26; BUN 17; Creatinine, Ser 1.25; Potassium 5.0; Sodium 137   Lipid Panel    Component Value Date/Time   CHOL 137 01/22/2019 0921   TRIG 133 01/22/2019 0921   HDL 29 (L) 01/22/2019 0921   CHOLHDL 4.7 01/22/2019 0921   VLDL 27 01/22/2019 0921   LDLCALC 81 01/22/2019 0921   LDLDIRECT  103.0 12/08/2016 0852     Other studies Reviewed: Additional studies/ records that were reviewed today with results demonstrating: ***.   ASSESSMENT AND PLAN:  1. AFib:  2. Anticoagulated: 3. Hypertensive heart disease: 4. Diabetes: 5. Tobacco abuse:   Current medicines are reviewed at length with the patient today.  The patient concerns regarding his medicines were addressed.  The following changes have been made:  No change***  Labs/ tests ordered today include: *** No orders of the defined types were placed in this encounter.   Recommend 150 minutes/week of aerobic exercise Low fat, low carb, high fiber diet recommended  Disposition:   FU in ***   Signed, Lance Muss, MD  04/08/2020 12:06 AM  [facility] [place], [place], [place]  [address] Phone: [phone]; Fax: [phone]

## 2020-04-09 ENCOUNTER — Ambulatory Visit: Payer: 59 | Admitting: Interventional Cardiology

## 2020-04-16 ENCOUNTER — Telehealth: Payer: Self-pay | Admitting: Internal Medicine

## 2020-04-16 MED ORDER — AMPHETAMINE-DEXTROAMPHET ER 30 MG PO CP24: ORAL_CAPSULE | ORAL | 0 refills | Status: DC

## 2020-04-16 NOTE — Telephone Encounter (Signed)
Called and left message for patient. He is requesting extended release Adderall but extended release is not on his profile, dose is 30 mg, last filled on 03/19/2020. Please advise.

## 2020-04-16 NOTE — Telephone Encounter (Signed)
Spoke with patient and let him know his Addrerall had been sent to the pharmacy.  Nothing further needed.

## 2020-04-16 NOTE — Telephone Encounter (Signed)
adderall  ER 30 mg e-sent to [facility]

## 2020-04-23 ENCOUNTER — Ambulatory Visit (HOSPITAL_COMMUNITY): Payer: 59 | Admitting: Physician Assistant

## 2020-04-23 ENCOUNTER — Encounter (HOSPITAL_COMMUNITY): Payer: Self-pay

## 2020-05-01 ENCOUNTER — Emergency Department (HOSPITAL_COMMUNITY)
Admission: EM | Admit: 2020-05-01 | Discharge: 2020-05-01 | Disposition: A | Discharge status: Home / Self Care | Payer: 59 | Attending: Emergency Medicine | Admitting: Emergency Medicine

## 2020-05-01 ENCOUNTER — Encounter (HOSPITAL_COMMUNITY): Payer: Self-pay | Admitting: *Deleted

## 2020-05-01 ENCOUNTER — Other Ambulatory Visit: Payer: Self-pay

## 2020-05-01 DIAGNOSIS — I10 Essential (primary) hypertension: Secondary | ICD-10-CM | POA: Insufficient documentation

## 2020-05-01 DIAGNOSIS — Z79899 Other long term (current) drug therapy: Secondary | ICD-10-CM | POA: Insufficient documentation

## 2020-05-01 DIAGNOSIS — I480 Paroxysmal atrial fibrillation: Secondary | ICD-10-CM | POA: Diagnosis not present

## 2020-05-01 DIAGNOSIS — E119 Type 2 diabetes mellitus without complications: Secondary | ICD-10-CM | POA: Diagnosis not present

## 2020-05-01 DIAGNOSIS — L03011 Cellulitis of right finger: Secondary | ICD-10-CM | POA: Diagnosis not present

## 2020-05-01 DIAGNOSIS — Z7901 Long term (current) use of anticoagulants: Secondary | ICD-10-CM | POA: Diagnosis not present

## 2020-05-01 DIAGNOSIS — F17210 Nicotine dependence, cigarettes, uncomplicated: Secondary | ICD-10-CM | POA: Insufficient documentation

## 2020-05-01 DIAGNOSIS — Z7984 Long term (current) use of oral hypoglycemic drugs: Secondary | ICD-10-CM | POA: Insufficient documentation

## 2020-05-01 MED ORDER — DOXYCYCLINE HYCLATE 100 MG PO TABS
100.0000 mg | ORAL_TABLET | Freq: Once | ORAL | Status: AC
  Administered 2020-05-01: 100 mg via ORAL
  Filled 2020-05-01: qty 1

## 2020-05-01 MED ORDER — DOXYCYCLINE HYCLATE 100 MG PO CAPS: 100.0000 mg | ORAL_CAPSULE | Freq: Two times a day (BID) | ORAL | 0 refills | Status: DC

## 2020-05-01 MED ORDER — LIDOCAINE HCL (PF) 1 % IJ SOLN
10.0000 mL | Freq: Once | INTRAMUSCULAR | Status: AC
  Administered 2020-05-01: 10 mL
  Filled 2020-05-01: qty 10

## 2020-05-01 NOTE — ED Provider Notes (Signed)
[facility]ENCY [facility] Provider Note   CSN: [id number] Arrival date & time: 05/01/20  1819     History Chief Complaint  Patient presents with  . finger blister    [name] is a 49 y.o. male.  [name] is a 49 y.o. male with a history of diabetes, hypertension, hyperlipidemia, paroxysmal A. fib, sleep apnea, obesity, who presents to the ED for evaluation of pain and swelling to the lateral aspect of his right thumb.  He states that he noticed that this was sore yesterday and today he noticed a blisterlike area on the side of his thumb, no drainage from the area.  He is able to bend and extend the finger without difficulty.  Denies any numbness or tingling.  No injury or burn to the area.  No other aggravating or alleviating factors.  Has had an abscess once before but no prior history of similar on his hands.        Past Medical History:  Diagnosis Date  . Diabetes mellitus   . GERD (gastroesophageal reflux disease)   . History of alcohol abuse   . History of cocaine use   . History of echocardiogram    a. Echo 4/14: Moderate LVH, vigorous LVEF, EF 65-70%, normal wall motion, grade 2 diastolic dysfunction, mildly dilated aortic root and ascending aorta, ascending aorta 40 mm, aortic root 38 mm, mild LAE  . Hx of cardiovascular stress test    a. GXT 5/14: No ischemic changes  //  b. ETT-Myoview 3/16:  Low risk, no ischemia, EF 58%  . Hyperlipidemia   . Hypertension   . Morbid obesity (HCC)   . Paroxysmal atrial fibrillation (HCC)    Occurring in 2008, with several recurrence since then (including in the setting of + cocaine on UDS).  . Sleep apnea    uses CPAP  . SVT (supraventricular tachycardia) (HCC)    mid to long RP SVT 09/2019    Patient Active Problem List   Diagnosis Date Noted  . Mediastinal adenopathy 01/06/2020  . Secondary hypercoagulable state (HCC) 10/22/2019  . Palpitations 09/30/2019  . Acute  respiratory failure with hypoxia (HCC) 09/30/2019  . SVT (supraventricular tachycardia) (HCC) 07/08/2019  . Acute pancreatitis 01/22/2019  . Class 3 obesity due to excess calories with serious comorbidity and body mass index (BMI) of 40.0 to 44.9 in adult 12/08/2016  . Paroxysmal atrial fibrillation (HCC) 01/23/2013  . Diabetes mellitus (HCC) 01/23/2013  . Hypertensive heart disease 01/23/2013  . Tobacco user 01/23/2013  . Chronic diastolic heart failure (HCC) 01/21/2013  . Community acquired pneumonia 01/20/2013  . 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 Shoulder pain, right 02/15/2011  . Seasonal and perennial allergic rhinitis 02/13/2008  . Dyslipidemia 02/12/2008  . OBESITY, MORBID 02/12/2008  . COCAINE Abuse, past hx 02/12/2008  . Obstructive sleep apnea 02/12/2008    Past Surgical History:  Procedure Laterality Date  . APPENDECTOMY    . ATRIAL FIBRILLATION ABLATION N/A 11/27/2019   Procedure: ATRIAL FIBRILLATION ABLATION;  Surgeon: Hillis RangeAllred, James, MD;  Location: [facility] INVASIVE CV LAB;  Service: Cardiovascular;  Laterality: N/A;  . ROTATOR CUFF REPAIR    . SVT ABLATION N/A 11/27/2019   Procedure: SVT ABLATION;  Surgeon: Hillis RangeAllred, James, MD;  Location: [facility] INVASIVE CV LAB;  Service: Cardiovascular;  Laterality: N/A;  . TONSILLECTOMY         Family History  Problem Relation Age of Onset  . Diabetes Mother   . Heart attack Father  37  . Stroke Maternal Grandmother   . Stroke Paternal Grandmother   . Diabetes Paternal Grandfather     Social History   Tobacco Use  . Smoking status: Current Every Day Smoker    Packs/day: 0.50    Years: 28.00    Pack years: 14.00    Types: Cigarettes  . Smokeless tobacco: Former Neurosurgeon    Types: Snuff  . Tobacco comment: smokes one pack per day 01/06/2020  Vaping Use  . Vaping Use: Never used  Substance Use Topics  . Alcohol use: Not Currently    Alcohol/week: 6.0 standard drinks    Types: 6 Standard drinks or equivalent per week     Comment: monthly  . Drug use: No    Comment: cocaine in the past, none currently    Home Medications Prior to Admission medications   Medication Sig Start Date End Date Taking? Authorizing Provider  amphetamine-dextroamphetamine (ADDERALL XR) 30 MG 24 hr capsule 1 daily as needed 04/16/20   Jetty Duhamel D, MD  atorvastatin (LIPITOR) 10 MG tablet Take 10 mg by mouth daily. 09/16/19   [provider]  Blood Pressure Monitoring (BLOOD PRESSURE MONITOR/L CUFF) MISC 1 Units by Does not apply route daily. 03/19/19   Jeannie Fend, PA-C  diltiazem (CARDIZEM) 30 MG tablet Take 1 Tablet Every 4 Hours As Needed For HR >100 10/30/19   Fenton, Clint R, PA  flecainide (TAMBOCOR) 100 MG tablet Take 1 tablet (100 mg total) by mouth 2 (two) times daily. 03/29/20   Allred, Fayrene Fearing, MD  glipiZIDE (GLUCOTROL) 10 MG tablet Take 10 mg by mouth 2 (two) times daily. 01/06/19   [provider]  lisinopril (ZESTRIL) 40 MG tablet Take 40 mg by mouth daily. 10/14/19   [provider]  metFORMIN (GLUCOPHAGE) 500 MG tablet Take 1,000 mg by mouth daily with breakfast.  01/06/19   [provider]  metoprolol succinate (TOPROL-XL) 50 MG 24 hr tablet Take 1 tablet (50 mg total) by mouth daily. Take with or immediately following a meal. 03/29/20 06/27/20  Allred, Fayrene Fearing, MD  pantoprazole (PROTONIX) 40 MG tablet TAKE 1 TABLET BY MOUTH EVERY DAY 12/22/19   Allred, Fayrene Fearing, MD  XARELTO 20 MG TABS tablet TAKE 1 TABLET (20 MG TOTAL) BY MOUTH DAILY WITH SUPPER. 04/05/20   Allred, Fayrene Fearing, MD    Allergies    Shrimp [shellfish allergy], Banana, Watermelon flavor, Peanut-containing drug products, Almond oil, and Sulfa antibiotics  Review of Systems   Review of Systems  Constitutional: Negative for chills and fever.  Musculoskeletal: Positive for arthralgias.  Skin: Positive for color change.  Neurological: Negative for weakness and numbness.    Physical Exam Updated Vital Signs BP 129/84   Pulse 81    Temp 98.1 F (36.7 C) (Oral)   Resp 16   Ht 5\' 9"  (1.753 m)   Wt (!) 142.4 kg   SpO2 99%   BMI 46.37 kg/m   Physical Exam Vitals and nursing note reviewed.  Constitutional:      General: He is not in acute distress.    Appearance: Normal appearance. He is well-developed. He is obese. He is not ill-appearing or diaphoretic.  HENT:     Head: Normocephalic and atraumatic.  Eyes:     General:        Right eye: No discharge.        Left eye: No discharge.  Pulmonary:     Effort: Pulmonary effort is normal. No respiratory distress.  Musculoskeletal:  Comments: Right thumb with large pocket of purulence along the lateral nail bed, no expressible drainage, the area is tender to palpation, no significant surrounding cellulitis, digit pad is soft, nail is intact.  Good cap refill, normal sensation and range of motion  Skin:    General: Skin is warm and dry.  Neurological:     Mental Status: He is alert and oriented to person, place, and time.     Coordination: Coordination normal.  Psychiatric:        Mood and Affect: Mood normal.        Behavior: Behavior normal.       ED Results / Procedures / Treatments   Labs (all labs ordered are listed, but only abnormal results are displayed) Labs Reviewed - No data to display  EKG None  Radiology No results found.  Procedures .Marland KitchenIncision and Drainage  Date/Time: 05/01/2020 10:55 PM Performed by: Dartha Lodge, PA-C Authorized by: Dartha Lodge, PA-C   Consent:    Consent obtained:  Verbal   Consent given by:  Patient   Risks discussed:  Bleeding, incomplete drainage, damage to other organs, infection and pain   Alternatives discussed:  No treatment Location:    Type:  Abscess   Location:  Upper extremity   Upper extremity location:  Finger   Finger location:  R thumb Pre-procedure details:    Skin preparation:  Chloraprep Anesthesia (see MAR for exact dosages):    Anesthesia method:  Nerve block   Block needle  gauge:  25 G   Block anesthetic:  Lidocaine 1% w/o epi   Block injection procedure:  Anatomic landmarks identified, introduced needle and incremental injection   Block outcome:  Anesthesia achieved Procedure type:    Complexity:  Simple Procedure details:    Incision types:  Single straight   Incision depth:  Dermal   Scalpel blade:  11   Wound management:  Probed and deloculated   Drainage:  Bloody and purulent   Drainage amount:  Copious   Wound treatment:  Wound left open   Packing materials:  None Post-procedure details:    Patient tolerance of procedure:  Tolerated well, no immediate complications   (including critical care time)  Medications Ordered in ED Medications  lidocaine (PF) (XYLOCAINE) 1 % injection 10 mL (10 mLs Infiltration Given by Other 05/01/20 2101)  doxycycline (VIBRA-TABS) tablet 100 mg (100 mg Oral Given 05/01/20 2134)    ED Course  I have reviewed the triage vital signs and the nursing notes.  Pertinent labs & imaging results that were available during my care of the patient were reviewed by me and considered in my medical decision making (see chart for details).    MDM Rules/Calculators/A&P                          49 year old male presents with pain and swelling to the right thumb, exam is consistent with large right paronychia, there is no evidence of felon on exam, and no surrounding cellulitis.  I&D performed with significant improvement in patient's pain with copious amount of purulent discharge.  Given that patient is diabetic we will place on antibiotics.  Dressing applied and discussed appropriate wound care and return precautions.  Discharged home in good condition.  Final Clinical Impression(s) / ED Diagnoses Final diagnoses:  Paronychia of thumb, right    Rx / DC Orders ED Discharge Orders         Ordered  doxycycline (VIBRAMYCIN) 100 MG capsule  2 times daily     Discontinue  Reprint     05/01/20 2124           Dartha Lodge,  PA-C 05/01/20 2256    Margarita Grizzle, MD 05/02/20 0005

## 2020-05-01 NOTE — ED Notes (Signed)
Discharge instructions reviewed with pt. Pt verbalized understanding.   

## 2020-05-01 NOTE — ED Triage Notes (Signed)
The pt has a lesion that appears to be a blister on his rt thumb  No known injury  No burn  He has never had anything like this he just noticed this yesterday

## 2020-05-01 NOTE — Discharge Instructions (Addendum)
Your paronychia was drained, you should do soaks in warm water 3 times daily and change dressing frequently, this may continue to drain a bit over the next day or so, that is normal.  We want this to heal from the inside out.  Monitor for signs of worsening infection such as redness, swelling, increasing drainage or worsening pain.  If you have difficulty bending or straightening the finger due to pain you should immediately return to the emergency department.  Take antibiotics as directed for the next week to help ensure infection resolves.

## 2020-05-03 ENCOUNTER — Other Ambulatory Visit: Payer: Self-pay

## 2020-05-03 ENCOUNTER — Emergency Department (HOSPITAL_COMMUNITY)
Admission: EM | Admit: 2020-05-03 | Discharge: 2020-05-03 | Disposition: A | Discharge status: Home / Self Care | Payer: 59 | Attending: Emergency Medicine | Admitting: Emergency Medicine

## 2020-05-03 ENCOUNTER — Encounter (HOSPITAL_COMMUNITY): Payer: Self-pay

## 2020-05-03 DIAGNOSIS — R109 Unspecified abdominal pain: Secondary | ICD-10-CM | POA: Insufficient documentation

## 2020-05-03 DIAGNOSIS — Z5321 Procedure and treatment not carried out due to patient leaving prior to being seen by health care provider: Secondary | ICD-10-CM | POA: Diagnosis not present

## 2020-05-03 DIAGNOSIS — R1110 Vomiting, unspecified: Secondary | ICD-10-CM | POA: Diagnosis not present

## 2020-05-03 DIAGNOSIS — R5383 Other fatigue: Secondary | ICD-10-CM | POA: Diagnosis not present

## 2020-05-03 DIAGNOSIS — R3915 Urgency of urination: Secondary | ICD-10-CM | POA: Diagnosis not present

## 2020-05-03 NOTE — ED Triage Notes (Signed)
Patient c/o fatigue, right flank pain and emesis. Patient states he had urinary frequency yesterday.

## 2020-05-04 ENCOUNTER — Emergency Department (HOSPITAL_COMMUNITY): Payer: 59

## 2020-05-04 ENCOUNTER — Encounter (HOSPITAL_COMMUNITY): Payer: Self-pay

## 2020-05-04 ENCOUNTER — Other Ambulatory Visit: Payer: Self-pay

## 2020-05-04 ENCOUNTER — Emergency Department (HOSPITAL_COMMUNITY)
Admission: EM | Admit: 2020-05-04 | Discharge: 2020-05-04 | Disposition: A | Discharge status: Home / Self Care | Payer: 59 | Attending: Emergency Medicine | Admitting: Emergency Medicine

## 2020-05-04 DIAGNOSIS — I110 Hypertensive heart disease with heart failure: Secondary | ICD-10-CM | POA: Insufficient documentation

## 2020-05-04 DIAGNOSIS — R0602 Shortness of breath: Secondary | ICD-10-CM | POA: Diagnosis present

## 2020-05-04 DIAGNOSIS — R5381 Other malaise: Secondary | ICD-10-CM | POA: Insufficient documentation

## 2020-05-04 DIAGNOSIS — Z20822 Contact with and (suspected) exposure to covid-19: Secondary | ICD-10-CM | POA: Insufficient documentation

## 2020-05-04 DIAGNOSIS — I5032 Chronic diastolic (congestive) heart failure: Secondary | ICD-10-CM | POA: Diagnosis not present

## 2020-05-04 DIAGNOSIS — E119 Type 2 diabetes mellitus without complications: Secondary | ICD-10-CM | POA: Insufficient documentation

## 2020-05-04 DIAGNOSIS — Z79899 Other long term (current) drug therapy: Secondary | ICD-10-CM | POA: Diagnosis not present

## 2020-05-04 DIAGNOSIS — F17210 Nicotine dependence, cigarettes, uncomplicated: Secondary | ICD-10-CM | POA: Insufficient documentation

## 2020-05-04 LAB — URINALYSIS, ROUTINE W REFLEX MICROSCOPIC
Bilirubin Urine: NEGATIVE
Cellular Cast, UA: 5
Glucose, UA: 50 mg/dL — AB
Ketones, ur: NEGATIVE mg/dL
Leukocytes,Ua: NEGATIVE
Nitrite: NEGATIVE
Protein, ur: 100 mg/dL — AB
Specific Gravity, Urine: 1.019 (ref 1.005–1.030)
pH: 5 (ref 5.0–8.0)

## 2020-05-04 LAB — COMPREHENSIVE METABOLIC PANEL
ALT: 24 U/L (ref 0–44)
AST: 26 U/L (ref 15–41)
Albumin: 3.1 g/dL — ABNORMAL LOW (ref 3.5–5.0)
Alkaline Phosphatase: 83 U/L (ref 38–126)
Anion gap: 10 (ref 5–15)
BUN: 26 mg/dL — ABNORMAL HIGH (ref 6–20)
CO2: 22 mmol/L (ref 22–32)
Calcium: 8.8 mg/dL — ABNORMAL LOW (ref 8.9–10.3)
Chloride: 102 mmol/L (ref 98–111)
Creatinine, Ser: 1.83 mg/dL — ABNORMAL HIGH (ref 0.61–1.24)
GFR calc Af Amer: 49 mL/min — ABNORMAL LOW (ref >60)
GFR calc non Af Amer: 42 mL/min — ABNORMAL LOW (ref >60)
Glucose, Bld: 208 mg/dL — ABNORMAL HIGH (ref 70–99)
Potassium: 4.5 mmol/L (ref 3.5–5.1)
Sodium: 134 mmol/L — ABNORMAL LOW (ref 135–145)
Total Bilirubin: 1.3 mg/dL — ABNORMAL HIGH (ref 0.3–1.2)
Total Protein: 7.5 g/dL (ref 6.5–8.1)

## 2020-05-04 LAB — CBC
HCT: 41.5 % (ref 39.0–52.0)
Hemoglobin: 13.4 g/dL (ref 13.0–17.0)
MCH: 27.5 pg (ref 26.0–34.0)
MCHC: 32.3 g/dL (ref 30.0–36.0)
MCV: 85.2 fL (ref 80.0–100.0)
Platelets: 267 10*3/uL (ref 150–400)
RBC: 4.87 MIL/uL (ref 4.22–5.81)
RDW: 13.1 % (ref 11.5–15.5)
WBC: 10.8 10*3/uL — ABNORMAL HIGH (ref 4.0–10.5)
nRBC: 0 % (ref 0.0–0.2)

## 2020-05-04 LAB — SARS CORONAVIRUS 2 BY RT PCR (HOSPITAL ORDER, PERFORMED IN ~~LOC~~ HOSPITAL LAB): SARS Coronavirus 2: NEGATIVE

## 2020-05-04 LAB — LIPASE, BLOOD: Lipase: 21 U/L (ref 11–51)

## 2020-05-04 IMAGING — DX DG CHEST 2V
2 series · 2 of 2 positions shown · non-contrast
Comparison: [DATE]

CLINICAL DATA: Shortness of breath

EXAM:
CHEST - 2 VIEW

[w chest pa]
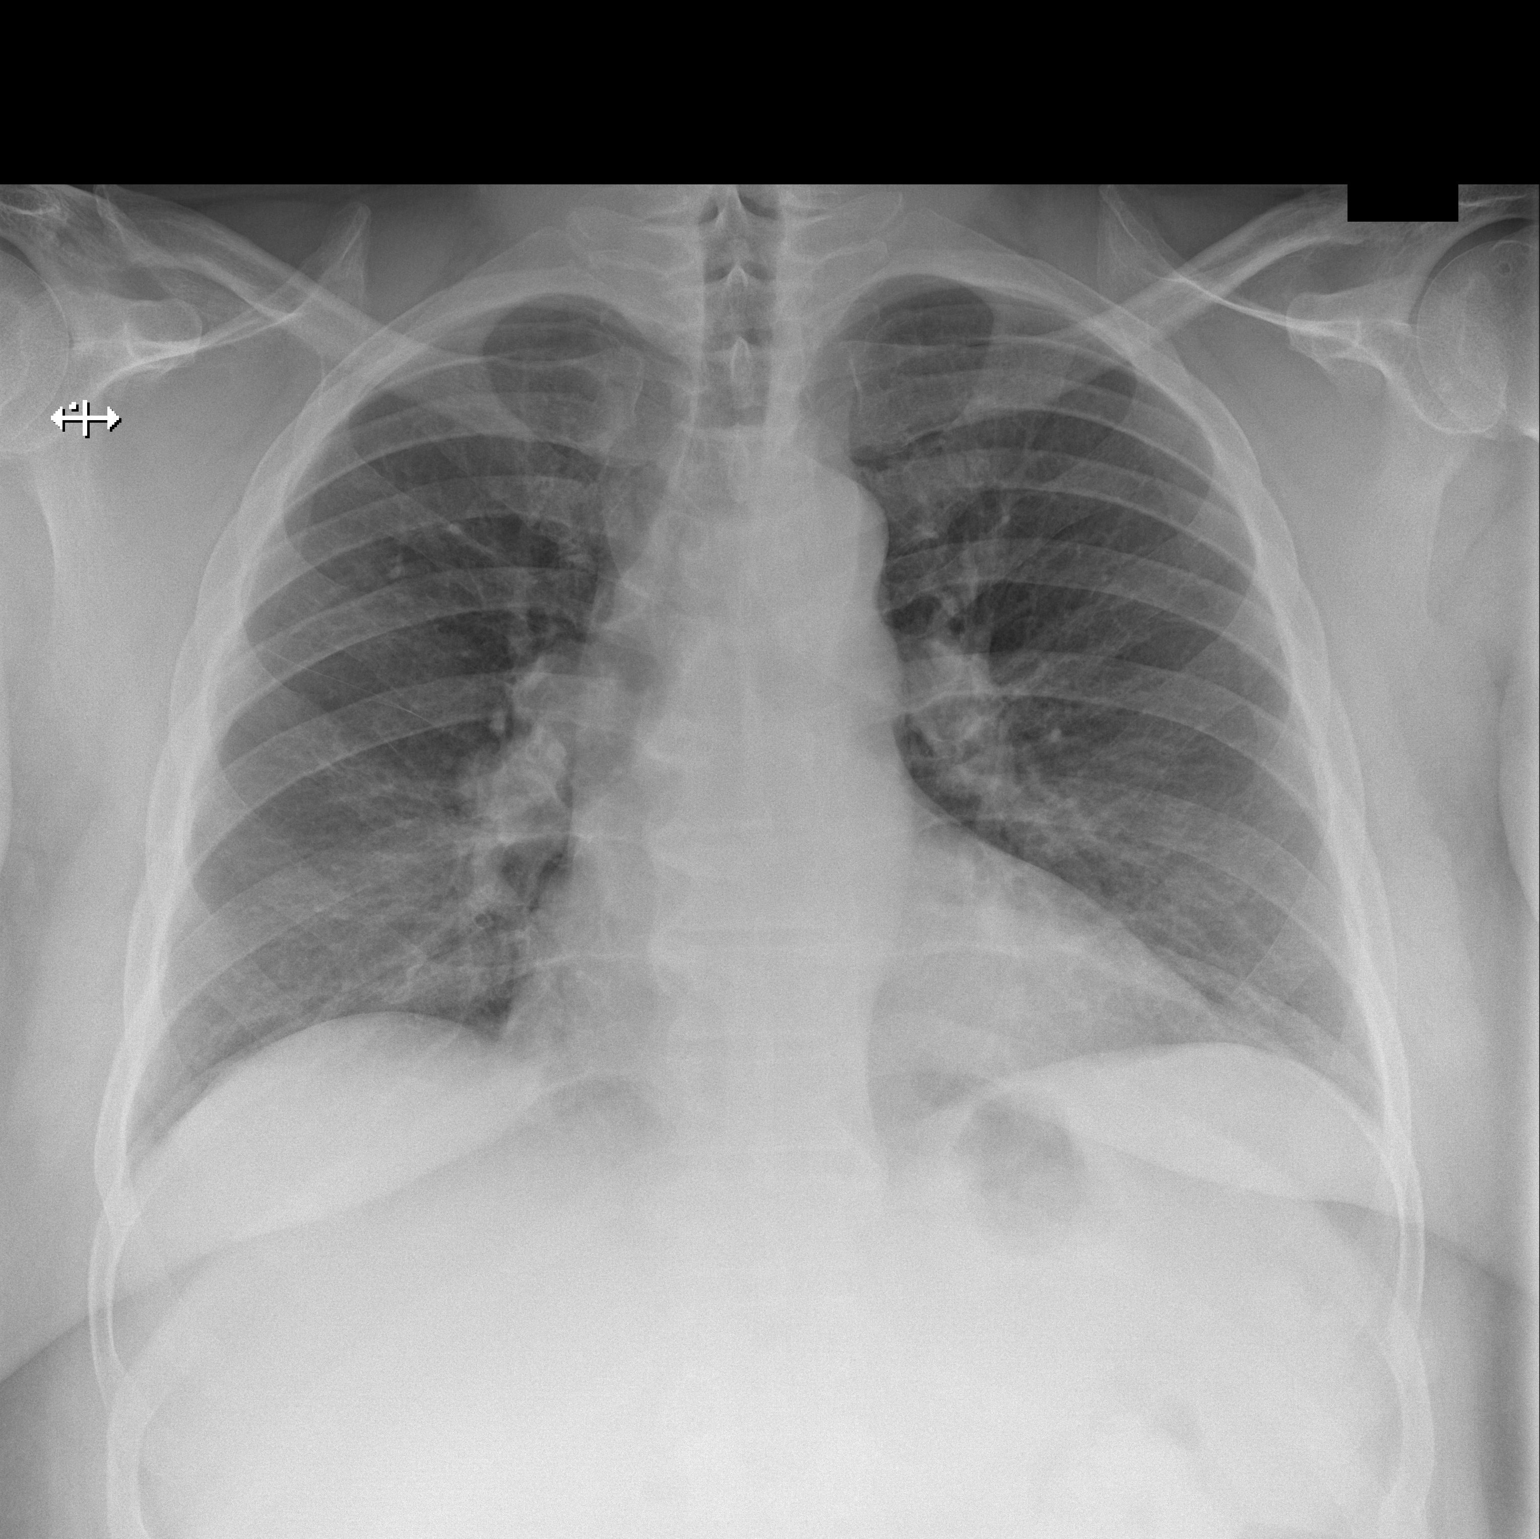

[w chest lat]
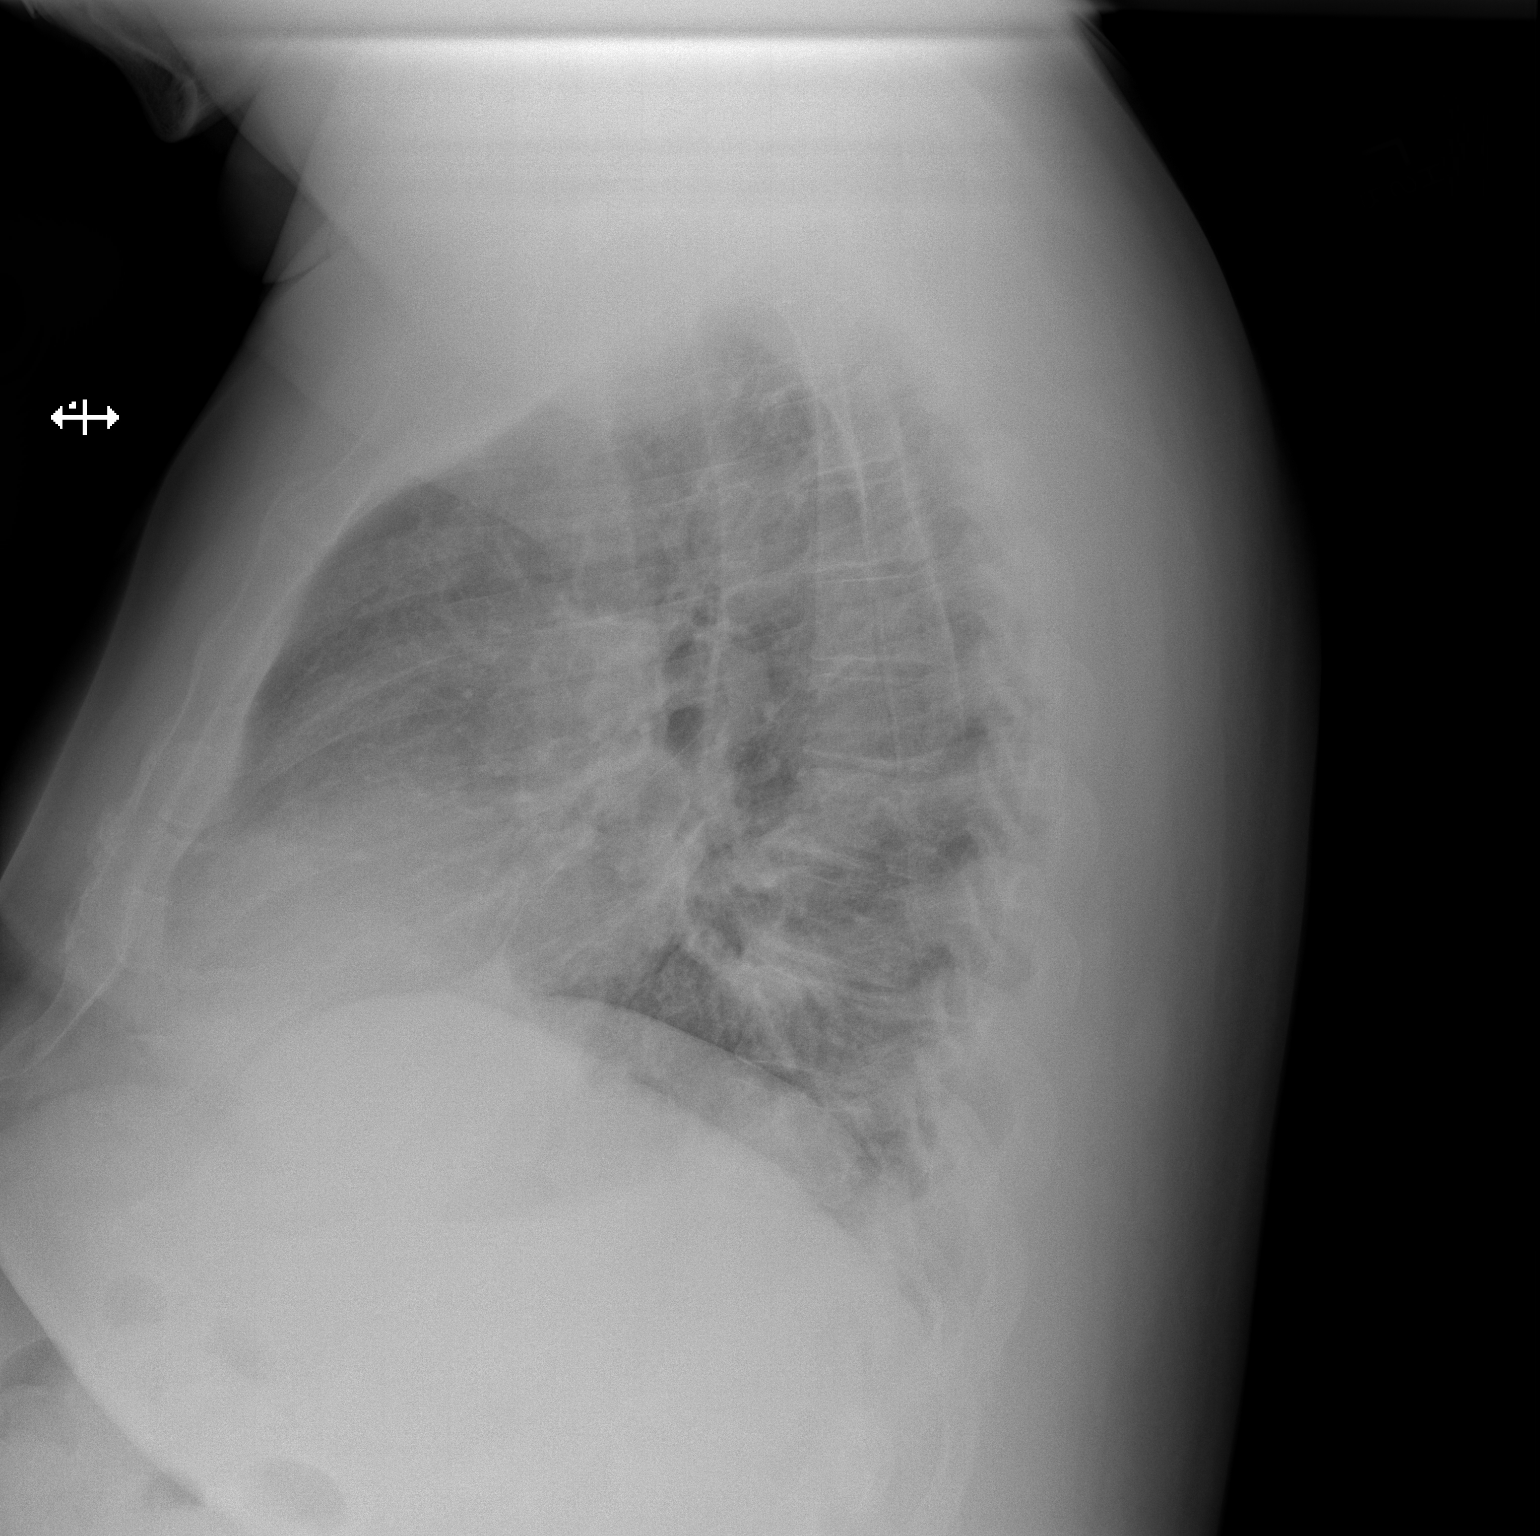

[2 of 2 positions shown; findings below may reference images not displayed]

FINDINGS: Normal heart size. Bilateral hilar prominence likely reflecting
adenopathy seen prior chest CT. Both lungs are clear. No pleural
effusion or pneumothorax. The visualized skeletal structures are
unremarkable.
IMPRESSION: No acute process in the chest. There is bilateral hilar prominence
likely reflecting adenopathy seen on prior chest CT.

## 2020-05-04 MED ORDER — SODIUM CHLORIDE 0.9 % IV BOLUS (SEPSIS)
1000.0000 mL | Freq: Once | INTRAVENOUS | Status: AC
  Administered 2020-05-04: 1000 mL via INTRAVENOUS

## 2020-05-04 MED ORDER — ONDANSETRON 8 MG PO TBDP: 8.0000 mg | ORAL_TABLET | Freq: Three times a day (TID) | ORAL | 0 refills | Status: DC | PRN

## 2020-05-04 MED ORDER — ONDANSETRON HCL 4 MG/2ML IJ SOLN
4.0000 mg | Freq: Once | INTRAMUSCULAR | Status: AC
  Administered 2020-05-04: 4 mg via INTRAVENOUS
  Filled 2020-05-04: qty 2

## 2020-05-04 MED ORDER — SODIUM CHLORIDE 0.9 % IV SOLN: 1000.0000 mL | INTRAVENOUS | Status: DC

## 2020-05-04 NOTE — ED Provider Notes (Signed)
[facility] EMERGENCY DEPARTMENT Provider Note   CSN: [id number] Arrival date & time: 05/04/20  6387     History Chief Complaint  Patient presents with  . Shortness of Breath  . Weakness    [name] is a 49 y.o. male.  HPI   Pt states he has not been feeling well since Sunday.  He feels fatigued and weak.  He has been having nausea and vomiting.  WHen he eats he vomits mucus.  He also has some pain with urination.  No fevers documented but he does not have a thermometer.  Pt has been vacinatted for covid.  No diarrhea.  No blood in stool.  Some constipation.  Past Medical History:  Diagnosis Date  . Diabetes mellitus   . GERD (gastroesophageal reflux disease)   . History of alcohol abuse   . History of cocaine use   . History of echocardiogram    a. Echo 4/14: Moderate LVH, vigorous LVEF, EF 65-70%, normal wall motion, grade 2 diastolic dysfunction, mildly dilated aortic root and ascending aorta, ascending aorta 40 mm, aortic root 38 mm, mild LAE  . Hx of cardiovascular stress test    a. GXT 5/14: No ischemic changes  //  b. ETT-Myoview 3/16:  Low risk, no ischemia, EF 58%  . Hyperlipidemia   . Hypertension   . Morbid obesity (HCC)   . Paroxysmal atrial fibrillation (HCC)    Occurring in 2008, with several recurrence since then (including in the setting of + cocaine on UDS).  . Sleep apnea    uses CPAP  . SVT (supraventricular tachycardia) (HCC)    mid to long RP SVT 09/2019    Patient Active Problem List   Diagnosis Date Noted  . Mediastinal adenopathy 01/06/2020  . Secondary hypercoagulable state (HCC) 10/22/2019  . Palpitations 09/30/2019  . Acute respiratory failure with hypoxia (HCC) 09/30/2019  . SVT (supraventricular tachycardia) (HCC) 07/08/2019  . Acute pancreatitis 01/22/2019  . Class 3 obesity due to excess calories with serious comorbidity and body mass index (BMI) of 40.0 to 44.9 in adult 12/08/2016  . Paroxysmal atrial  fibrillation (HCC) 01/23/2013  . Diabetes mellitus (HCC) 01/23/2013  . Hypertensive heart disease 01/23/2013  . Tobacco user 01/23/2013  . Chronic diastolic heart failure (HCC) 01/21/2013  . Community acquired pneumonia 01/20/2013  . 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 Shoulder pain, right 02/15/2011  . Seasonal and perennial allergic rhinitis 02/13/2008  . Dyslipidemia 02/12/2008  . OBESITY, MORBID 02/12/2008  . COCAINE Abuse, past hx 02/12/2008  . Obstructive sleep apnea 02/12/2008    Past Surgical History:  Procedure Laterality Date  . APPENDECTOMY    . ATRIAL FIBRILLATION ABLATION N/A 11/27/2019   Procedure: ATRIAL FIBRILLATION ABLATION;  Surgeon: [name], MD;  Location: [facility] INVASIVE CV LAB;  Service: Cardiovascular;  Laterality: N/A;  . ROTATOR CUFF REPAIR    . SVT ABLATION N/A 11/27/2019   Procedure: SVT ABLATION;  Surgeon: [name], MD;  Location: [facility] INVASIVE CV LAB;  Service: Cardiovascular;  Laterality: N/A;  . TONSILLECTOMY         Family History  Problem Relation Age of Onset  . Diabetes Mother   . Heart attack Father 15  . Stroke Maternal Grandmother   . Stroke Paternal Grandmother   . Diabetes Paternal Grandfather     Social History   Tobacco Use  . Smoking status: Current Every Day Smoker    Packs/day: 0.50    Years: 28.00  Pack years: 14.00    Types: Cigarettes  . Smokeless tobacco: Former Neurosurgeon    Types: Snuff  . Tobacco comment: smokes one pack per day 01/06/2020  Vaping Use  . Vaping Use: Never used  Substance Use Topics  . Alcohol use: Not Currently    Alcohol/week: 6.0 standard drinks    Types: 6 Standard drinks or equivalent per week    Comment: monthly  . Drug use: Not Currently    Comment: cocaine in the past, none currently    Home Medications Prior to Admission medications   Medication Sig Start Date End Date Taking? Authorizing Provider  amphetamine-dextroamphetamine (ADDERALL XR) 30 MG 24 hr capsule 1 daily as  needed Patient taking differently: Take 30 mg by mouth daily as needed (focus). 1 daily as needed 04/16/20  Yes Young, Joni Fears D, MD  atorvastatin (LIPITOR) 10 MG tablet Take 10 mg by mouth daily. 09/16/19  Yes [provider]  diltiazem (CARDIZEM) 30 MG tablet Take 1 Tablet Every 4 Hours As Needed For HR >100 10/30/19  Yes Fenton, Clint R, PA  doxycycline (VIBRAMYCIN) 100 MG capsule Take 1 capsule (100 mg total) by mouth 2 (two) times daily. One po bid x 7 days 05/01/20  Yes Dartha Lodge, PA-C  flecainide (TAMBOCOR) 100 MG tablet Take 1 tablet (100 mg total) by mouth 2 (two) times daily. 03/29/20  Yes Allred, Fayrene Fearing, MD  glipiZIDE (GLUCOTROL) 10 MG tablet Take 10 mg by mouth 2 (two) times daily. 01/06/19  Yes [provider]  ibuprofen (ADVIL) 200 MG tablet Take 400 mg by mouth every 6 (six) hours as needed for moderate pain.   Yes [provider]  lisinopril (ZESTRIL) 40 MG tablet Take 40 mg by mouth daily. 10/14/19  Yes [provider]  metFORMIN (GLUCOPHAGE) 500 MG tablet Take 1,000 mg by mouth 2 (two) times daily with a meal.  01/06/19  Yes [provider]  metoprolol succinate (TOPROL-XL) 50 MG 24 hr tablet Take 1 tablet (50 mg total) by mouth daily. Take with or immediately following a meal. 03/29/20 06/27/20 Yes Allred, Fayrene Fearing, MD  pantoprazole (PROTONIX) 40 MG tablet TAKE 1 TABLET BY MOUTH EVERY DAY Patient taking differently: Take 40 mg by mouth daily.  12/22/19  Yes Allred, Fayrene Fearing, MD  XARELTO 20 MG TABS tablet TAKE 1 TABLET (20 MG TOTAL) BY MOUTH DAILY WITH SUPPER. Patient taking differently: Take 20 mg by mouth daily with supper.  04/05/20  Yes Allred, Fayrene Fearing, MD  Blood Pressure Monitoring (BLOOD PRESSURE MONITOR/L CUFF) MISC 1 Units by Does not apply route daily. 03/19/19   Jeannie Fend, PA-C  ondansetron (ZOFRAN ODT) 8 MG disintegrating tablet Take 1 tablet (8 mg total) by mouth every 8 (eight) hours as needed for nausea or vomiting. 05/04/20   Linwood Dibbles,  MD    Allergies    Shrimp [shellfish allergy], Banana, Watermelon flavor, Peanut-containing drug products, Almond oil, and Sulfa antibiotics  Review of Systems   Review of Systems  All other systems reviewed and are negative.   Physical Exam Updated Vital Signs BP 132/87   Pulse 85   Temp 98.7 F (37.1 C) (Oral)   Resp 19   Ht 1.753 m (5\' 9" )   Wt (!) 145.2 kg   SpO2 97%   BMI 47.26 kg/m   Physical Exam Vitals and nursing note reviewed.  Constitutional:      General: He is not in acute distress.    Appearance: He is well-developed.  HENT:  Head: Normocephalic and atraumatic.     Right Ear: External ear normal.     Left Ear: External ear normal.  Eyes:     General: No scleral icterus.       Right eye: No discharge.        Left eye: No discharge.     Conjunctiva/sclera: Conjunctivae normal.  Neck:     Trachea: No tracheal deviation.  Cardiovascular:     Rate and Rhythm: Normal rate and regular rhythm.  Pulmonary:     Effort: Pulmonary effort is normal. No respiratory distress.     Breath sounds: Normal breath sounds. No stridor. No wheezing or rales.  Abdominal:     General: Bowel sounds are normal. There is no distension.     Palpations: Abdomen is soft.     Tenderness: There is no abdominal tenderness. There is no guarding or rebound.  Musculoskeletal:        General: No tenderness.     Cervical back: Neck supple.  Skin:    General: Skin is warm and dry.     Findings: No rash.  Neurological:     Mental Status: He is alert.     Cranial Nerves: No cranial nerve deficit (no facial droop, extraocular movements intact, no slurred speech).     Sensory: No sensory deficit.     Motor: No abnormal muscle tone or seizure activity.     Coordination: Coordination normal.     ED Results / Procedures / Treatments   Labs (all labs ordered are listed, but only abnormal results are displayed) Labs Reviewed  CBC - Abnormal; Notable for the following components:       Result Value   WBC 10.8 (*)    All other components within normal limits  URINALYSIS, ROUTINE W REFLEX MICROSCOPIC - Abnormal; Notable for the following components:   Color, Urine AMBER (*)    APPearance CLOUDY (*)    Glucose, UA 50 (*)    Hgb urine dipstick SMALL (*)    Protein, ur 100 (*)    Bacteria, UA FEW (*)    All other components within normal limits  COMPREHENSIVE METABOLIC PANEL - Abnormal; Notable for the following components:   Sodium 134 (*)    Glucose, Bld 208 (*)    BUN 26 (*)    Creatinine, Ser 1.83 (*)    Calcium 8.8 (*)    Albumin 3.1 (*)    Total Bilirubin 1.3 (*)    GFR calc non Af Amer 42 (*)    GFR calc Af Amer 49 (*)    All other components within normal limits  SARS CORONAVIRUS 2 BY RT PCR (HOSPITAL ORDER, PERFORMED IN [facility])  LIPASE, BLOOD    EKG None  Radiology DG Chest 2 View  Result Date: 05/04/2020 CLINICAL DATA:  Shortness of breath EXAM: CHEST - 2 VIEW COMPARISON:  01/06/2020 FINDINGS: Normal heart size. Bilateral hilar prominence likely reflecting adenopathy seen prior chest CT. Both lungs are clear. No pleural effusion or pneumothorax. The visualized skeletal structures are unremarkable. IMPRESSION: No acute process in the chest. There is bilateral hilar prominence likely reflecting adenopathy seen on prior chest CT. Electronically Signed   By: Guadlupe SpanishPraneil  Patel [name]   On: 05/04/2020 09:27    Procedures Procedures (including critical care time)  Medications Ordered in ED Medications  ondansetron (ZOFRAN) injection 4 mg (has no administration in time range)  sodium chloride 0.9 % bolus 1,000 mL (has no administration in time  range)    Followed by  0.9 %  sodium chloride infusion (has no administration in time range)    ED Course  I have reviewed the triage vital signs and the nursing notes.  Pertinent labs & imaging results that were available during my care of the patient were reviewed by me and considered in my  medical decision making (see chart for details).  Clinical Course as of May 04 1430  Tue May 04, 2020  1427 UA without signs of infection.   [JK]    Clinical Course User Index [JK] Linwood Dibbles, MD   MDM Rules/Calculators/A&P                          Patient presented to the ED with complaints of general malaise and nausea and vomiting previously. In the ED the patient's work-up has been reassuring. No signs of severe dehydration. Patient's not anemic. Urinalysis does show a few hours blood cells but symptoms are not suggestive of renal colic. Is not having any abdominal pain or back pain. Chest x-ray does not show pneumonia. Patient has been fully vaccinated against Covid. However considering his symptoms and the delta variant I will order Covid test. He does otherwise appear stable for discharge and outpatient follow-up. Final Clinical Impression(s) / ED Diagnoses Final diagnoses:  Malaise    Rx / DC Orders ED Discharge Orders         Ordered    ondansetron (ZOFRAN ODT) 8 MG disintegrating tablet  Every 8 hours PRN     Discontinue     05/04/20 1430           Linwood Dibbles, MD 05/04/20 1431

## 2020-05-04 NOTE — ED Notes (Signed)
Pt d/c home per MD order. Discharge summary reviewed with pt, pt verbalizes understanding. No complaints at discharge . Ambulatory off unit. No s/s of acute distress noted.

## 2020-05-04 NOTE — ED Triage Notes (Signed)
Pt reports generalized weakness , emesis and sob for the past few days, no fever. Fully vaccinated for COVID. Resp e.u, denies chest pain

## 2020-05-04 NOTE — Discharge Instructions (Signed)
A covid test was sent and should be available later this evening.  Follow  up with your doctor to be rechecked if the symptoms persist.  Take the medications as needed for nausea.  Rest, return to the ED for worsening symptoms

## 2020-05-07 ENCOUNTER — Ambulatory Visit: Payer: 59 | Admitting: Internal Medicine

## 2020-05-10 ENCOUNTER — Ambulatory Visit: Payer: 59 | Admitting: Internal Medicine

## 2020-05-10 ENCOUNTER — Encounter: Payer: Self-pay | Admitting: Internal Medicine

## 2020-05-10 ENCOUNTER — Other Ambulatory Visit: Payer: Self-pay | Admitting: *Deleted

## 2020-05-10 ENCOUNTER — Other Ambulatory Visit: Payer: Self-pay

## 2020-05-10 VITALS — BP 90/70 | HR 60 | Temp 98.4°F | Ht 69.0 in | Wt 296.4 lb

## 2020-05-10 DIAGNOSIS — R590 Localized enlarged lymph nodes: Secondary | ICD-10-CM | POA: Diagnosis not present

## 2020-05-10 DIAGNOSIS — L03011 Cellulitis of right finger: Secondary | ICD-10-CM | POA: Insufficient documentation

## 2020-05-10 DIAGNOSIS — D869 Sarcoidosis, unspecified: Secondary | ICD-10-CM

## 2020-05-10 LAB — CBC WITH DIFFERENTIAL/PLATELET
Basophils Absolute: 0.1 10*3/uL (ref 0.0–0.1)
Basophils Relative: 0.7 % (ref 0.0–3.0)
Eosinophils Absolute: 0.4 10*3/uL (ref 0.0–0.7)
Eosinophils Relative: 4.7 % (ref 0.0–5.0)
HCT: 37.7 % — ABNORMAL LOW (ref 39.0–52.0)
Hemoglobin: 12.6 g/dL — ABNORMAL LOW (ref 13.0–17.0)
Lymphocytes Relative: 13.6 % (ref 12.0–46.0)
Lymphs Abs: 1.2 10*3/uL (ref 0.7–4.0)
MCHC: 33.4 g/dL (ref 30.0–36.0)
MCV: 83.7 fl (ref 78.0–100.0)
Monocytes Absolute: 1.4 10*3/uL — ABNORMAL HIGH (ref 0.1–1.0)
Monocytes Relative: 16.3 % — ABNORMAL HIGH (ref 3.0–12.0)
Neutro Abs: 5.7 10*3/uL (ref 1.4–7.7)
Neutrophils Relative %: 64.7 % (ref 43.0–77.0)
Platelets: 303 10*3/uL (ref 150.0–400.0)
RBC: 4.5 Mil/uL (ref 4.22–5.81)
RDW: 13.9 % (ref 11.5–15.5)
WBC: 8.8 10*3/uL (ref 4.0–10.5)

## 2020-05-10 LAB — BASIC METABOLIC PANEL
BUN: 25 mg/dL — ABNORMAL HIGH (ref 6–23)
CO2: 26 mEq/L (ref 19–32)
Calcium: 9 mg/dL (ref 8.4–10.5)
Chloride: 100 mEq/L (ref 96–112)
Creatinine, Ser: 1.81 mg/dL — ABNORMAL HIGH (ref 0.40–1.50)
GFR: 48.36 mL/min — ABNORMAL LOW (ref >60.00)
Glucose, Bld: 129 mg/dL — ABNORMAL HIGH (ref 70–99)
Potassium: 4.5 mEq/L (ref 3.5–5.1)
Sodium: 132 mEq/L — ABNORMAL LOW (ref 135–145)

## 2020-05-10 NOTE — Assessment & Plan Note (Signed)
ACE was low and chest seems stable. We will wait until current acute malaise is resolved then repeat CT with contrast looking at stability of hilar adenopathy. This may be burned out sarcoid.

## 2020-05-10 NOTE — Progress Notes (Signed)
HPI  male smoker followed for management of OSA and narcolepsy with cataplexy, complicated by allergic rhinitis, atrial fibrillation, dCHF, DM, HTN NPSG 11/23/00- AHI 20 per hour. Hypnagogic hallucination associated with dreaming. 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 Vivid dreams as soon as he falls asleep Multiple Sleep Latency Test 10/30/2012-pathologic daytime hypersomnia, nonspecific, compatible with idiopathic hypersomnia or narcolepsy. Mean latency 0.9 minutes with one sleep onset REM event  ---------------------------------------------------------------------------------------   [birth year]49 year old male Smoker followed for management of OSA and narcolepsy with cataplexy, complicated by allergic rhinitis, atrial fibrillation/Xarelto flecainide, dCHF, Diastolic Dysfunction, CAD, DM2, HTN, Pancreatitis, Obesity, Tobacco use,  CPAP auto 4-17/ [employer] compliance 100%, AHI 1/ hr Body weight today 314 lbs Smoking 1 ppd Wakes dry nose in AM Using CPAP well. Unemployed except part time work on [employer]. Still dozes if bored. No longer using stimulant med. R shoulder pain comes and goes. Discussed CT findings with hilar adenopathy. No hx TB, adenopathy, adenopathy, rash or fever. CT coronary led to CT chest 12/05/19-  IMPRESSION: 1. Mediastinal and bilateral hilar adenopathy. Differential considerations include lymphoma/leukemia, sarcoidosis and metastatic lymph nodes. Metastatic nodes are less likely in the absence of CT findings suspicious for lung cancer. 2. Mild patchy prominence of the interstitial markings bilaterally, most likely due to interstitial pulmonary edema, interstitial pneumonitis or chronic interstitial lung disease. This can be seen with sarcoidosis. 3. Mild cardiomegaly. 4. Atheromatous coronary artery calcifications. 5. Mild bilateral gynecomastia.  05/10/20- 49 year old male Smoker followed for management of OSA and narcolepsy with  cataplexy, complicated by allergic rhinitis, atrial fibrillation/Xarelto flecainide/ Xarelto, dCHF, Diastolic Dysfunction, CAD, DM2, HTN, Pancreatitis, Obesity, Tobacco use,  CPAP auto 4-17/ [employer]-                                                      Had 2 Phizer Covax Body weight today- 296 lbs ED visit for malaise, dyspnea, N&V on 05/04/20- SARs swab Neg, > Zofran, d ACE level <5 on 01/06/20 CXR 05/04/20- IMPRESSION: No acute process in the chest. There is bilateral hilar prominence likely reflecting adenopathy seen on prior chest CT. 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  Went to ER for malaise and dehydration. 3 days earlier 3/17 he had paronychia lanced R thumb and started doxycycline. Since then has continued malaise, some chilling, some sweats. Denies cough. UTI was questioned and Dr Nehemiah Settle put him on Cipro, started 2 days ago with none taken yet today. Thumb feels some better today.   ROS-see HPI  + = positive Constitutional:   No-   weight loss, night sweats, fevers, +chills,  +fatigue, lassitude. HEENT:   No-  headaches, difficulty swallowing, tooth/dental problems, sore throat,       No-  sneezing, itching, ear ache, + nasal congestion, post nasal drip,  CV:  No-   chest pain, orthopnea, PND, swelling in lower extremities, anasarca, dizziness, palpitations Resp: No-   shortness of breath with exertion or at rest.              No-   productive cough,  No non-productive cough,  No- coughing up of blood.              No-   change in color of mucus.  No- wheezing.  Skin: No-   rash or lesions. GI:  No-   heartburn, indigestion, abdominal pain, nausea, vomiting,  GU:  MS:  No-   joint pain or swelling.  Neuro-     +HPI Psych:  No- change in mood or affect. No depression or anxiety.  No memory loss.  OBJ- Physical Exam   General- Alert/ calm, Oriented, Affect-appropriate, Distress- none acute, +obese,  Skin- rash-none, lesions-  none, excoriation- none Lymphadenopathy- none Head- atraumatic            Eyes- Gross vision intact, PERRLA, conjunctivae and secretions clear            Ears- Hearing, canals-normal            Nose- rhinitis/ turbinate edema, no-Septal dev, mucus, polyps, erosion, perforation             Throat- Mallampati III , mucosa clear , drainage- none, tonsils- atrophic Neck- flexible , trachea midline, no stridor , thyroid nl, carotid no bruit Chest - symmetrical excursion , unlabored           Heart/CV- RRR , no murmur , no gallop  , no rub, nl s1 s2                           - JVD- none , edema- none, stasis changes- none, varices- none           Lung- clear to P&A, wheeze- none, cough- none , dullness-none, rub- none           Chest wall-  Abd-  Br/ Gen/ Rectal- Not done, not indicated Extrem- cyanosis- + some thickening nail bed R thumb c/w resolving infection- not hot or red.

## 2020-05-10 NOTE — Patient Instructions (Signed)
It is important now to stay well- hydrated. Drink plenty of water and maybe Gatorade so you aren't thirsty.  Order- lab- CBC w diff, BMET  If you don't start feeling better pretty quickly as you continue your Cipro antibiotic, then check with Dr Nehemiah Settle

## 2020-05-10 NOTE — Assessment & Plan Note (Signed)
He got doxycycline and then Cipro. Thumb seems to be improving.  He describe sweats, chills and malaise. No audible murmur. Needs to stay well-hydrated, finish antibiotic, and follow up with Dr Nehemiah Settle, watching for possible sepsis in this diabetic, as discussed.

## 2020-05-12 ENCOUNTER — Ambulatory Visit: Payer: 59 | Admitting: Podiatry

## 2020-05-17 ENCOUNTER — Telehealth: Payer: Self-pay | Admitting: Internal Medicine

## 2020-05-17 MED ORDER — AMPHETAMINE-DEXTROAMPHET ER 30 MG PO CP24: 30.0000 mg | ORAL_CAPSULE | Freq: Every day | ORAL | 0 refills | Status: DC | PRN

## 2020-05-17 NOTE — Telephone Encounter (Signed)
adderall refill e-sent 

## 2020-05-17 NOTE — Telephone Encounter (Signed)
Spoke with patient, let him know that his Amphetamine-dextroamphetamine had been sent to his pharmacy as requested.  Nothing further needed.

## 2020-05-17 NOTE — Telephone Encounter (Signed)
Dr Maple Hudson if you are ok with sending in refill for this patient please sign order pended below.  calling to get a refill on amphetamine-dextroamphetamine sent in to [facility] on [address]

## 2020-06-01 ENCOUNTER — Emergency Department (HOSPITAL_COMMUNITY): Payer: 59

## 2020-06-01 ENCOUNTER — Inpatient Hospital Stay (HOSPITAL_COMMUNITY): Payer: 59

## 2020-06-01 ENCOUNTER — Encounter (HOSPITAL_COMMUNITY): Payer: Self-pay | Admitting: *Deleted

## 2020-06-01 ENCOUNTER — Inpatient Hospital Stay (HOSPITAL_COMMUNITY)
Admission: EM | Admit: 2020-06-01 | Discharge: 2020-06-02 | DRG: 065 | Disposition: A | Discharge status: Home / Self Care | Payer: 59 | Attending: Internal Medicine | Admitting: Internal Medicine

## 2020-06-01 ENCOUNTER — Other Ambulatory Visit: Payer: Self-pay

## 2020-06-01 DIAGNOSIS — Z20822 Contact with and (suspected) exposure to covid-19: Secondary | ICD-10-CM | POA: Diagnosis present

## 2020-06-01 DIAGNOSIS — I639 Cerebral infarction, unspecified: Principal | ICD-10-CM | POA: Diagnosis present

## 2020-06-01 DIAGNOSIS — Z6839 Body mass index (BMI) 39.0-39.9, adult: Secondary | ICD-10-CM

## 2020-06-01 DIAGNOSIS — I6310 Cerebral infarction due to embolism of unspecified precerebral artery: Secondary | ICD-10-CM | POA: Diagnosis not present

## 2020-06-01 DIAGNOSIS — F17210 Nicotine dependence, cigarettes, uncomplicated: Secondary | ICD-10-CM | POA: Diagnosis present

## 2020-06-01 DIAGNOSIS — R3915 Urgency of urination: Secondary | ICD-10-CM | POA: Diagnosis present

## 2020-06-01 DIAGNOSIS — Z91018 Allergy to other foods: Secondary | ICD-10-CM

## 2020-06-01 DIAGNOSIS — I7389 Other specified peripheral vascular diseases: Secondary | ICD-10-CM | POA: Diagnosis present

## 2020-06-01 DIAGNOSIS — G4733 Obstructive sleep apnea (adult) (pediatric): Secondary | ICD-10-CM | POA: Diagnosis present

## 2020-06-01 DIAGNOSIS — K219 Gastro-esophageal reflux disease without esophagitis: Secondary | ICD-10-CM | POA: Diagnosis present

## 2020-06-01 DIAGNOSIS — Z79899 Other long term (current) drug therapy: Secondary | ICD-10-CM

## 2020-06-01 DIAGNOSIS — Z7901 Long term (current) use of anticoagulants: Secondary | ICD-10-CM | POA: Diagnosis not present

## 2020-06-01 DIAGNOSIS — E785 Hyperlipidemia, unspecified: Secondary | ICD-10-CM | POA: Diagnosis present

## 2020-06-01 DIAGNOSIS — R471 Dysarthria and anarthria: Secondary | ICD-10-CM | POA: Diagnosis present

## 2020-06-01 DIAGNOSIS — Z9049 Acquired absence of other specified parts of digestive tract: Secondary | ICD-10-CM | POA: Diagnosis not present

## 2020-06-01 DIAGNOSIS — Z833 Family history of diabetes mellitus: Secondary | ICD-10-CM

## 2020-06-01 DIAGNOSIS — Z7984 Long term (current) use of oral hypoglycemic drugs: Secondary | ICD-10-CM

## 2020-06-01 DIAGNOSIS — R739 Hyperglycemia, unspecified: Secondary | ICD-10-CM

## 2020-06-01 DIAGNOSIS — E1165 Type 2 diabetes mellitus with hyperglycemia: Secondary | ICD-10-CM

## 2020-06-01 DIAGNOSIS — I6389 Other cerebral infarction: Secondary | ICD-10-CM | POA: Diagnosis not present

## 2020-06-01 DIAGNOSIS — E118 Type 2 diabetes mellitus with unspecified complications: Secondary | ICD-10-CM

## 2020-06-01 DIAGNOSIS — Z8249 Family history of ischemic heart disease and other diseases of the circulatory system: Secondary | ICD-10-CM | POA: Diagnosis not present

## 2020-06-01 DIAGNOSIS — Z882 Allergy status to sulfonamides status: Secondary | ICD-10-CM

## 2020-06-01 DIAGNOSIS — Z7982 Long term (current) use of aspirin: Secondary | ICD-10-CM

## 2020-06-01 DIAGNOSIS — F909 Attention-deficit hyperactivity disorder, unspecified type: Secondary | ICD-10-CM | POA: Diagnosis present

## 2020-06-01 DIAGNOSIS — Z91013 Allergy to seafood: Secondary | ICD-10-CM | POA: Diagnosis not present

## 2020-06-01 DIAGNOSIS — R29702 NIHSS score 2: Secondary | ICD-10-CM | POA: Diagnosis present

## 2020-06-01 DIAGNOSIS — I6340 Cerebral infarction due to embolism of unspecified cerebral artery: Secondary | ICD-10-CM | POA: Insufficient documentation

## 2020-06-01 DIAGNOSIS — Z9114 Patient's other noncompliance with medication regimen: Secondary | ICD-10-CM

## 2020-06-01 DIAGNOSIS — I5032 Chronic diastolic (congestive) heart failure: Secondary | ICD-10-CM | POA: Diagnosis present

## 2020-06-01 DIAGNOSIS — F1410 Cocaine abuse, uncomplicated: Secondary | ICD-10-CM | POA: Diagnosis present

## 2020-06-01 DIAGNOSIS — Z91010 Allergy to peanuts: Secondary | ICD-10-CM

## 2020-06-01 DIAGNOSIS — I480 Paroxysmal atrial fibrillation: Secondary | ICD-10-CM | POA: Diagnosis present

## 2020-06-01 DIAGNOSIS — R4701 Aphasia: Secondary | ICD-10-CM | POA: Diagnosis present

## 2020-06-01 DIAGNOSIS — E119 Type 2 diabetes mellitus without complications: Secondary | ICD-10-CM

## 2020-06-01 DIAGNOSIS — I110 Hypertensive heart disease with heart failure: Secondary | ICD-10-CM | POA: Diagnosis present

## 2020-06-01 DIAGNOSIS — Z720 Tobacco use: Secondary | ICD-10-CM | POA: Diagnosis present

## 2020-06-01 DIAGNOSIS — I4891 Unspecified atrial fibrillation: Secondary | ICD-10-CM | POA: Diagnosis present

## 2020-06-01 LAB — COMPREHENSIVE METABOLIC PANEL
ALT: 22 U/L (ref 0–44)
AST: 25 U/L (ref 15–41)
Albumin: 3.2 g/dL — ABNORMAL LOW (ref 3.5–5.0)
Alkaline Phosphatase: 121 U/L (ref 38–126)
Anion gap: 8 (ref 5–15)
BUN: 26 mg/dL — ABNORMAL HIGH (ref 6–20)
CO2: 23 mmol/L (ref 22–32)
Calcium: 8.9 mg/dL (ref 8.9–10.3)
Chloride: 102 mmol/L (ref 98–111)
Creatinine, Ser: 1.4 mg/dL — ABNORMAL HIGH (ref 0.61–1.24)
GFR calc Af Amer: >60 mL/min (ref >60)
GFR calc non Af Amer: 59 mL/min — ABNORMAL LOW (ref >60)
Glucose, Bld: 335 mg/dL — ABNORMAL HIGH (ref 70–99)
Potassium: 5.7 mmol/L — ABNORMAL HIGH (ref 3.5–5.1)
Sodium: 133 mmol/L — ABNORMAL LOW (ref 135–145)
Total Bilirubin: 1 mg/dL (ref 0.3–1.2)
Total Protein: 7.4 g/dL (ref 6.5–8.1)

## 2020-06-01 LAB — I-STAT CHEM 8, ED
BUN: 35 mg/dL — ABNORMAL HIGH (ref 6–20)
Calcium, Ion: 1.17 mmol/L (ref 1.15–1.40)
Chloride: 103 mmol/L (ref 98–111)
Creatinine, Ser: 1.3 mg/dL — ABNORMAL HIGH (ref 0.61–1.24)
Glucose, Bld: 337 mg/dL — ABNORMAL HIGH (ref 70–99)
HCT: 39 % (ref 39.0–52.0)
Hemoglobin: 13.3 g/dL (ref 13.0–17.0)
Potassium: 5.4 mmol/L — ABNORMAL HIGH (ref 3.5–5.1)
Sodium: 137 mmol/L (ref 135–145)
TCO2: 25 mmol/L (ref 22–32)

## 2020-06-01 LAB — DIFFERENTIAL
Abs Immature Granulocytes: 0.02 10*3/uL (ref 0.00–0.07)
Basophils Absolute: 0.1 10*3/uL (ref 0.0–0.1)
Basophils Relative: 1 %
Eosinophils Absolute: 0.7 10*3/uL — ABNORMAL HIGH (ref 0.0–0.5)
Eosinophils Relative: 9 %
Immature Granulocytes: 0 %
Lymphocytes Relative: 17 %
Lymphs Abs: 1.2 10*3/uL (ref 0.7–4.0)
Monocytes Absolute: 0.9 10*3/uL (ref 0.1–1.0)
Monocytes Relative: 12 %
Neutro Abs: 4.5 10*3/uL (ref 1.7–7.7)
Neutrophils Relative %: 61 %

## 2020-06-01 LAB — APTT: aPTT: 42 seconds — ABNORMAL HIGH (ref 24–36)

## 2020-06-01 LAB — SARS CORONAVIRUS 2 BY RT PCR (HOSPITAL ORDER, PERFORMED IN ~~LOC~~ HOSPITAL LAB): SARS Coronavirus 2: NEGATIVE

## 2020-06-01 LAB — CBC
HCT: 40.4 % (ref 39.0–52.0)
Hemoglobin: 13 g/dL (ref 13.0–17.0)
MCH: 27.7 pg (ref 26.0–34.0)
MCHC: 32.2 g/dL (ref 30.0–36.0)
MCV: 86 fL (ref 80.0–100.0)
Platelets: 315 10*3/uL (ref 150–400)
RBC: 4.7 MIL/uL (ref 4.22–5.81)
RDW: 13.3 % (ref 11.5–15.5)
WBC: 7.3 10*3/uL (ref 4.0–10.5)
nRBC: 0 % (ref 0.0–0.2)

## 2020-06-01 LAB — RAPID URINE DRUG SCREEN, HOSP PERFORMED
Amphetamines: NOT DETECTED
Barbiturates: NOT DETECTED
Benzodiazepines: NOT DETECTED
Cocaine: NOT DETECTED
Opiates: NOT DETECTED
Tetrahydrocannabinol: NOT DETECTED

## 2020-06-01 LAB — PROTIME-INR
INR: 1.9 — ABNORMAL HIGH (ref 0.8–1.2)
Prothrombin Time: 21.3 seconds — ABNORMAL HIGH (ref 11.4–15.2)

## 2020-06-01 LAB — CBG MONITORING, ED
Glucose-Capillary: 312 mg/dL — ABNORMAL HIGH (ref 70–99)
Glucose-Capillary: 194 mg/dL — ABNORMAL HIGH (ref 70–99)

## 2020-06-01 IMAGING — CT CT HEAD W/O CM
4 series · 16 of 47 positions shown, 18 images · non-contrast
Comparison: None.

CLINICAL DATA: Acute neuro deficit.  Slurred speech and confusion.

EXAM:
CT HEAD WITHOUT CONTRAST
TECHNIQUE: Contiguous axial images were obtained from the base of the skull
through the vertex without intravenous contrast.

[Series 3: head wo · axial · 0.42mm/px · z∈[-223,-93]mm · 7 of 36 slices shown, 9 images]
[im 5/36  brain]
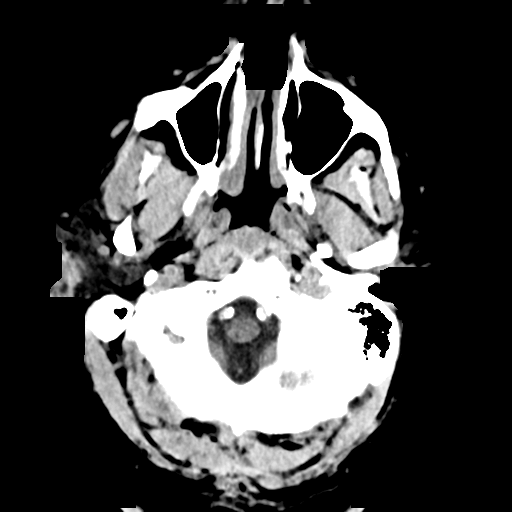
[im 5/36  bone]
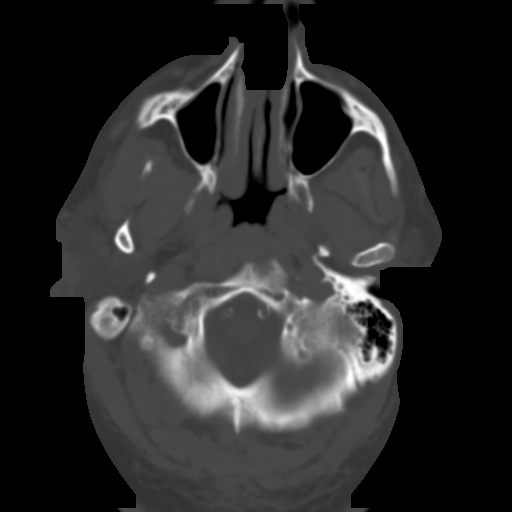
[im 9/36  brain]
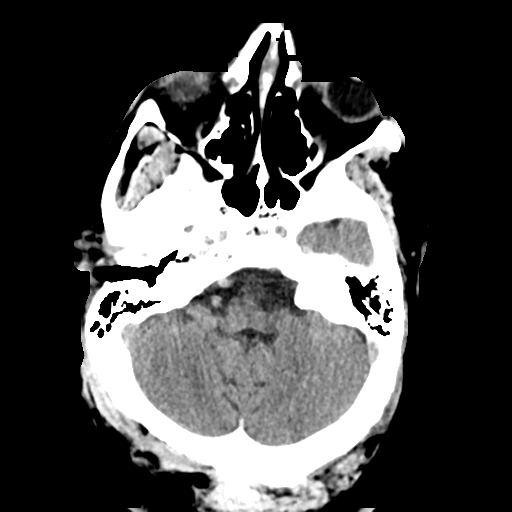
[im 14/36  brain]
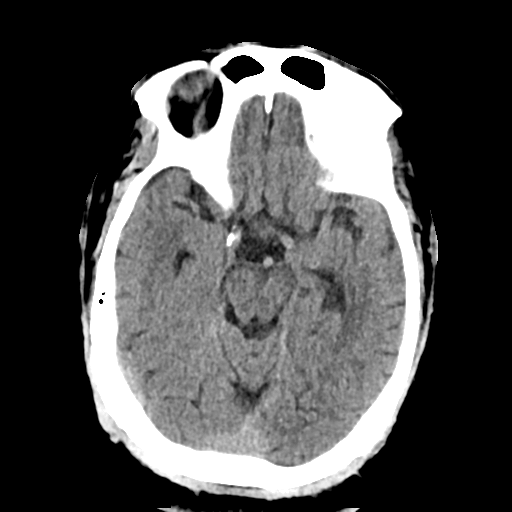
[im 18/36  brain]
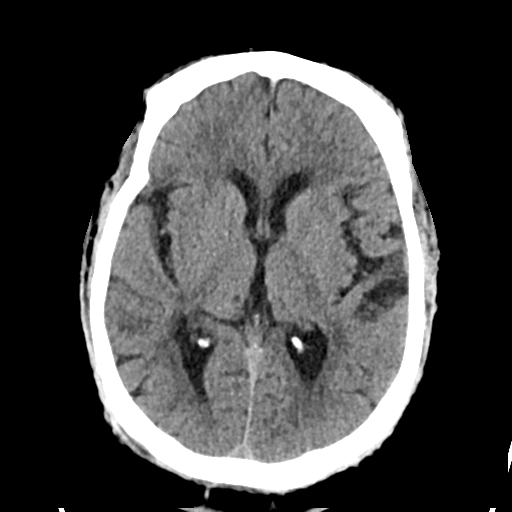
[im 22/36  brain]
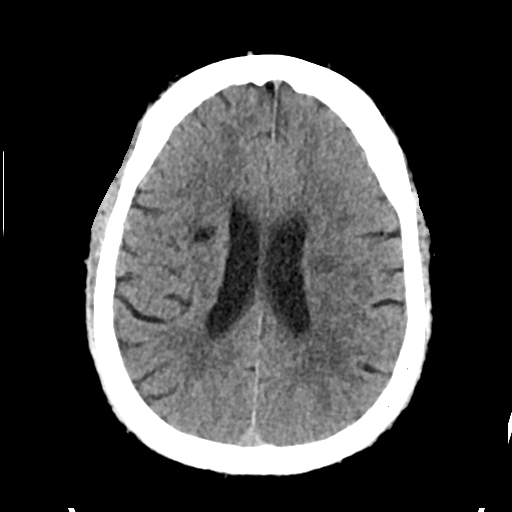
[im 22/36  bone]
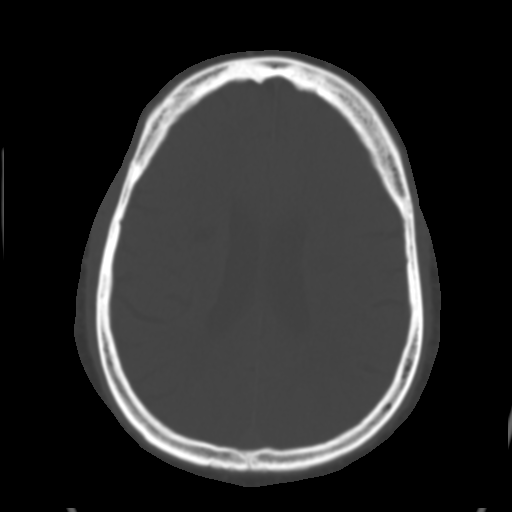
[im 27/36  brain]
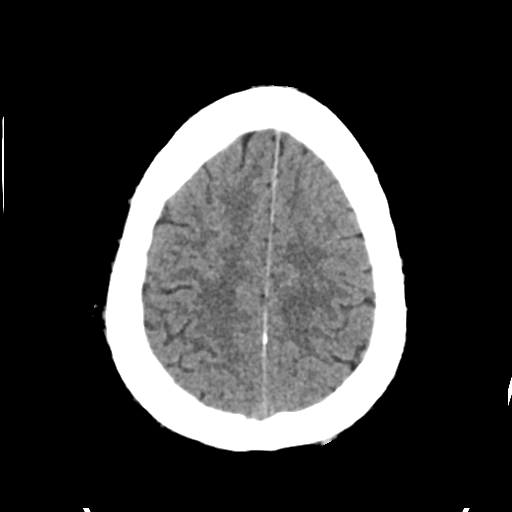
[im 31/36  brain]
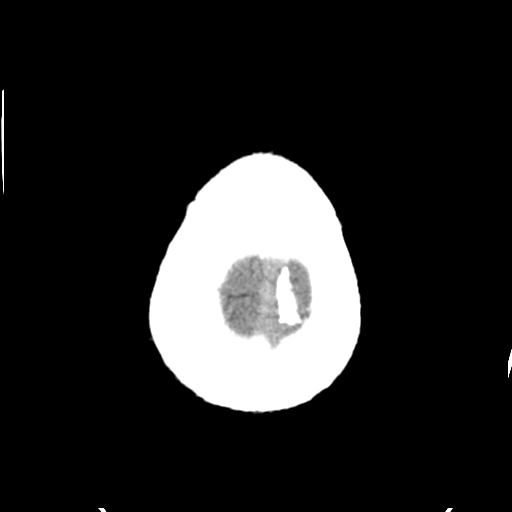

[Series 4: head bone · axial · 0.42mm/px · z∈[-227,-191]mm · 3 of 90 slices shown]
[im 9/90  bone]
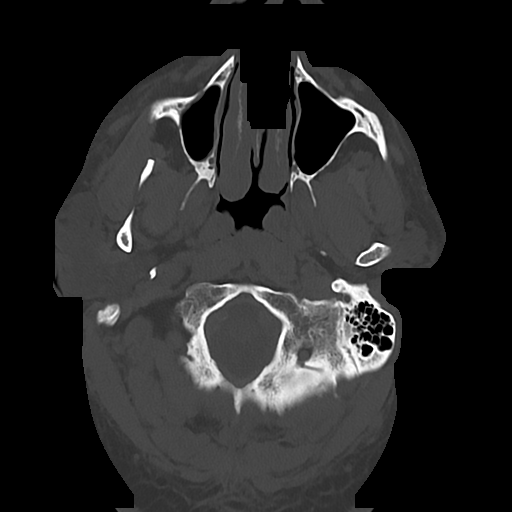
[im 18/90  bone]
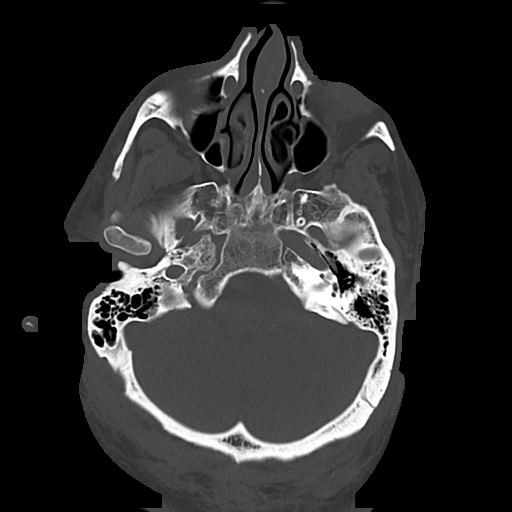
[im 27/90  bone]
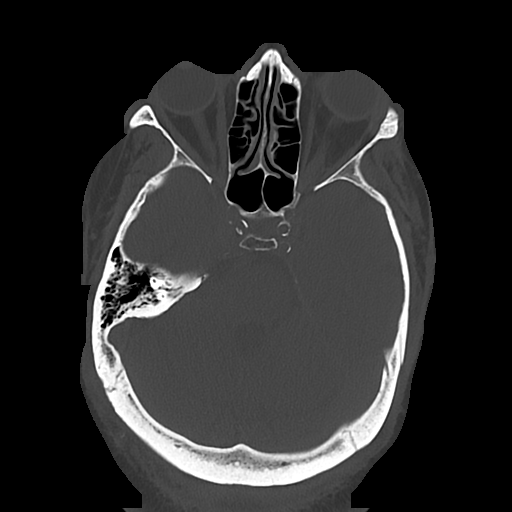

[Series 5: cor soft · coronal · 0.35mm/px · 3 of 80 slices shown]
[im 27/80  brain]
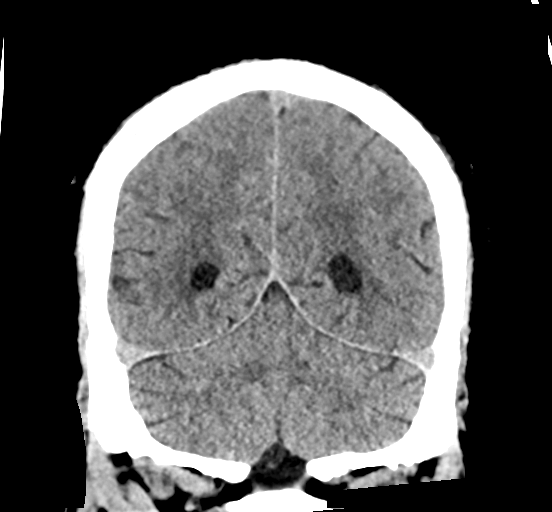
[im 36/80  brain]
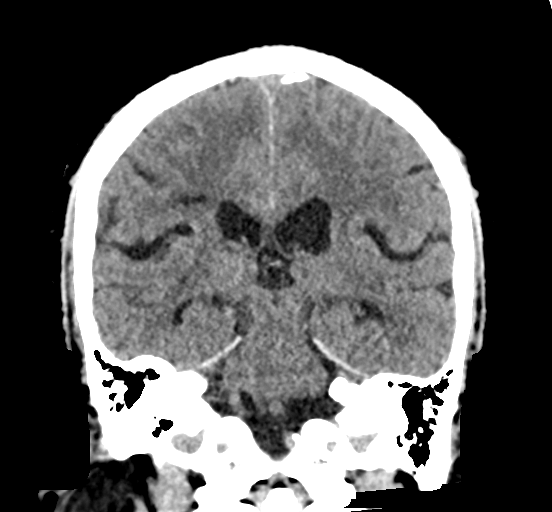
[im 44/80  brain]
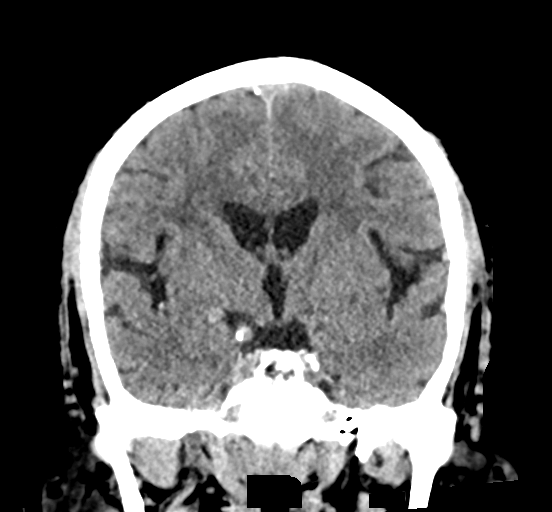

[Series 6: sag soft · sagittal · 0.35mm/px · 3 of 64 slices shown]
[im 22/64  brain]
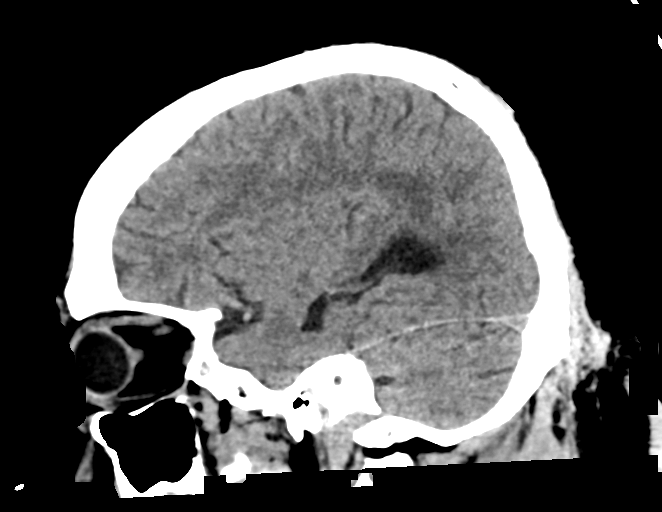
[im 32/64  brain]
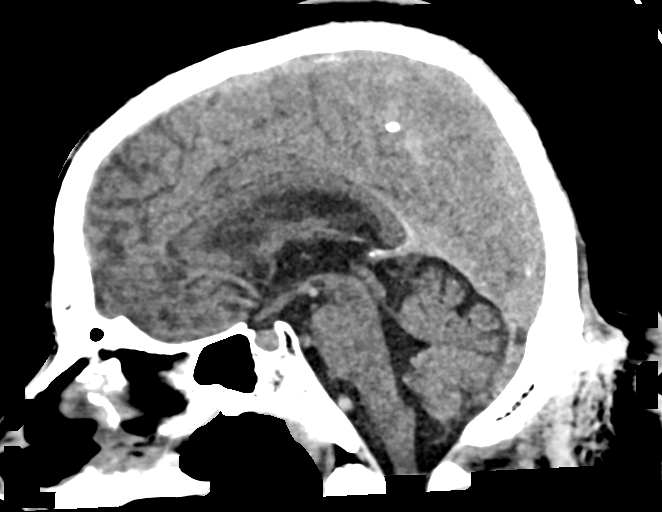
[im 43/64  brain]
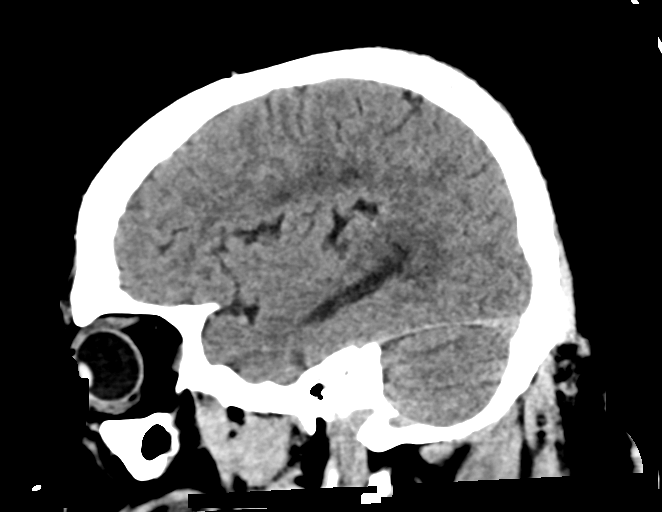

[16 of 47 positions shown; findings below may reference images not displayed]

FINDINGS: Brain: Generalized atrophy, most prominent around the sylvian
fissure bilaterally. Negative for hydrocephalus. Bilateral white
matter hypodensities compatible with microvascular ischemia. Focal
hypodensities in the corona radiata bilaterally could be subacute or
chronic infarct.

Negative for hemorrhage or mass.  No midline shift.

Vascular: Atherosclerotic calcification carotid and vertebral
arteries. Negative for hyperdense vessel

Skull: Negative

Sinuses/Orbits: Mild mucosal edema paranasal sinuses. Negative orbit

Other: None
IMPRESSION: Generalized atrophy, greater than expected for age

Bilateral white matter disease most compatible with microvascular
ischemia also more prominent than expected for age. Focal
hypodensities in the corona radiata bilaterally could represent
subacute or chronic ischemia. No intracranial hemorrhage.

## 2020-06-01 IMAGING — MR MR MRA NECK W/O CM
1 series · 42 of 48 positions shown · non-contrast
Comparison: None.

CLINICAL DATA: Neuro deficit, acute, stroke suspected

EXAM:
MRA NECK WITHOUT CONTRAST
TECHNIQUE: Angiographic images of the neck were obtained using MRA technique
without intravenous contrast.

[Series 10: tof_fl3d_tra_iso · axial · 0.6mm · 0.52mm/px · z∈[-203,-128]mm · 42 of 133 slices shown]
[im 1/133]
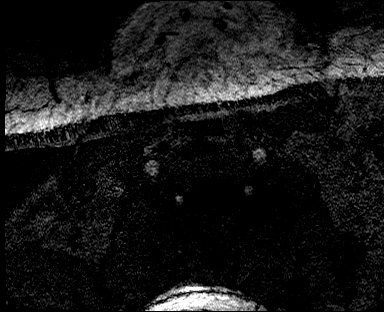
[im 3/133]
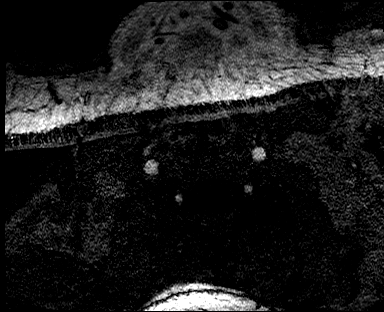
[im 6/133]
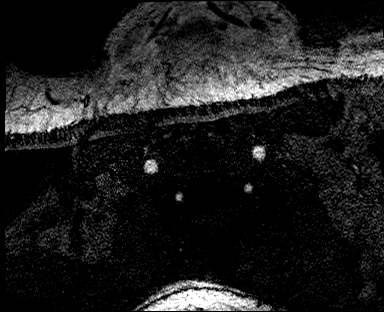
[im 9/133]
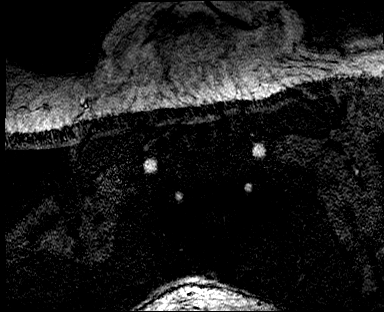
[im 12/133]
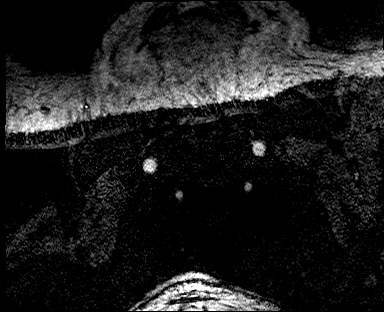
[im 15/133]
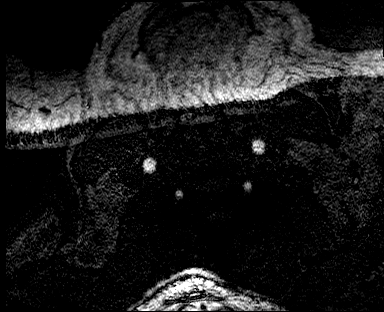
[im 17/133]
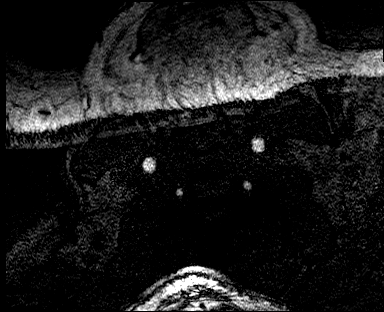
[im 20/133]
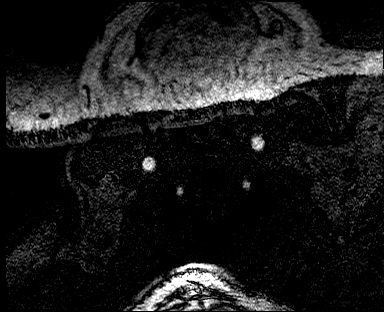
[im 23/133]
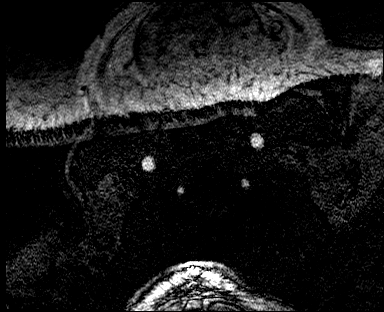
[im 26/133]
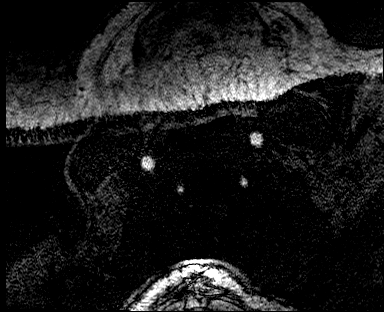
[im 29/133]
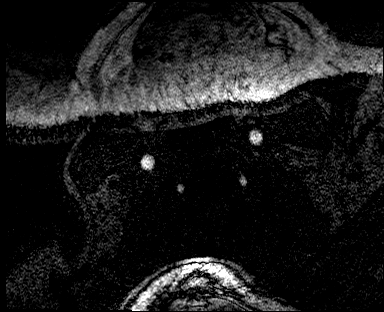
[im 31/133]
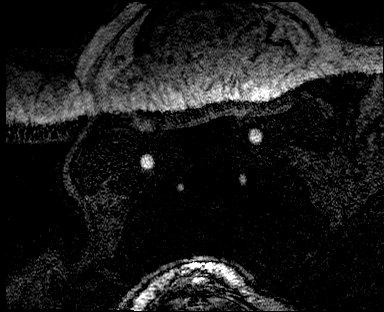
[im 34/133]
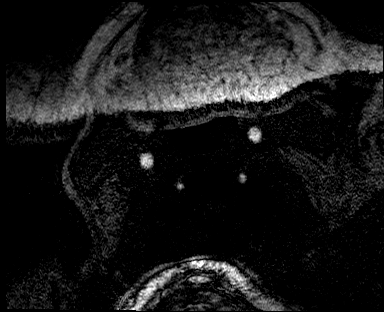
[im 37/133]
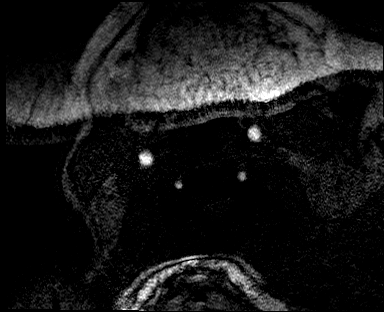
[im 40/133]
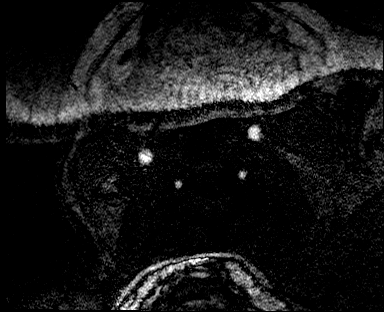
[im 43/133]
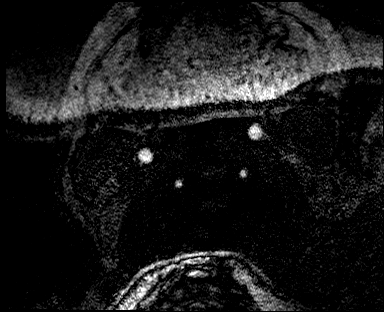
[im 45/133]
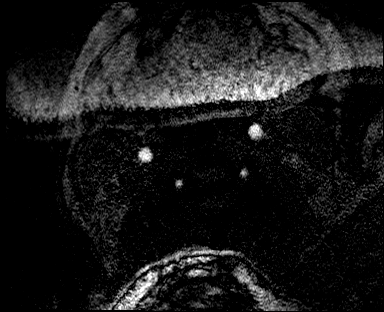
[im 48/133]
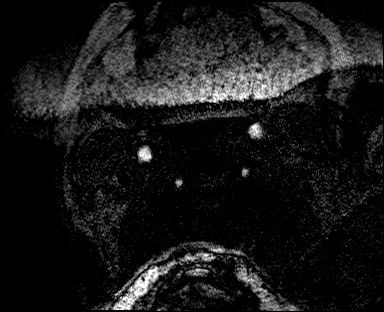
[im 51/133]
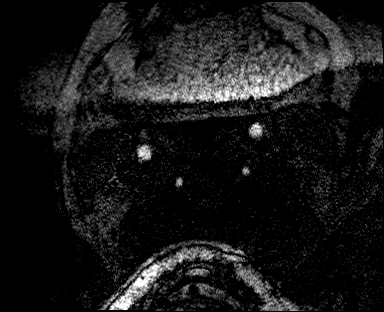
[im 54/133]
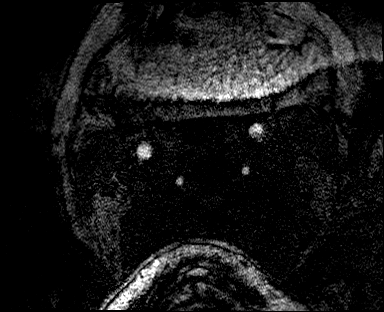
[im 57/133]
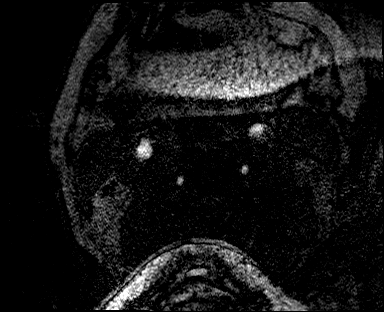
[im 59/133]
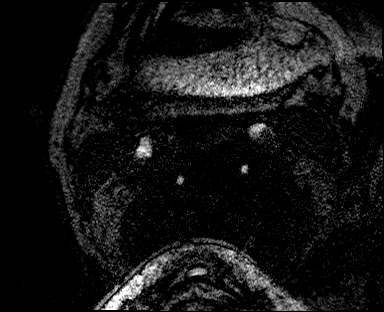
[im 62/133]
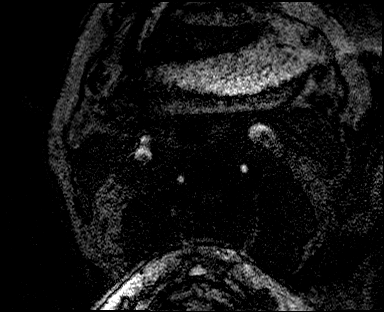
[im 65/133]
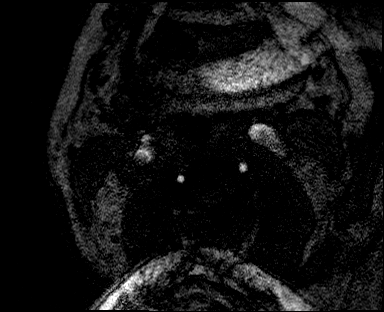
[im 68/133]
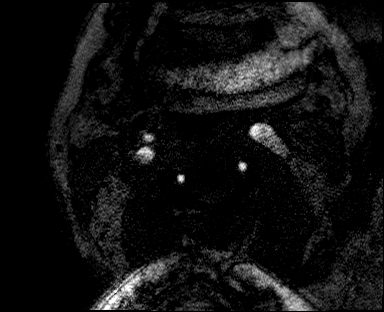
[im 71/133]
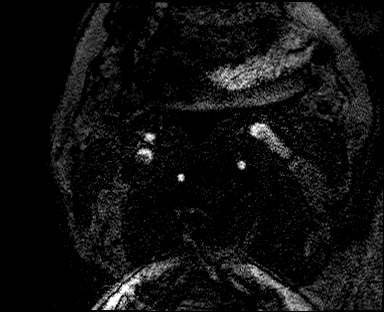
[im 74/133]
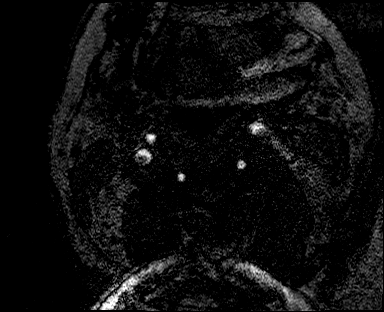
[im 76/133]
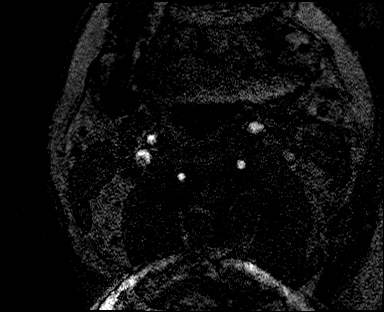
[im 79/133]
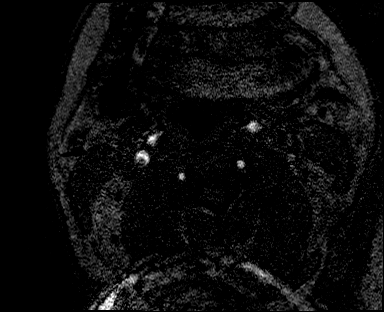
[im 82/133]
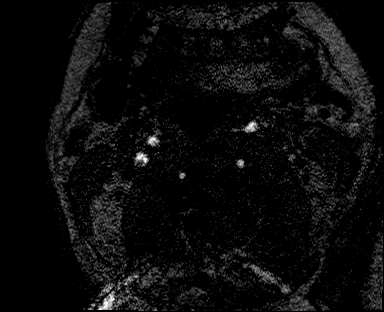
[im 85/133]
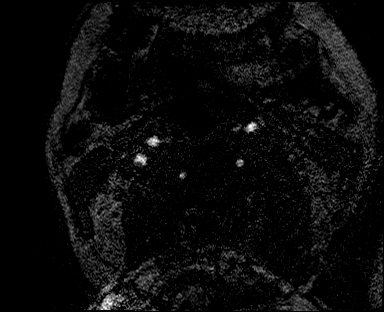
[im 88/133]
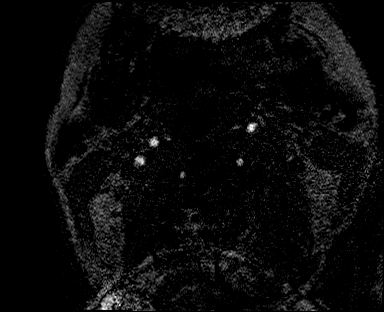
[im 90/133]
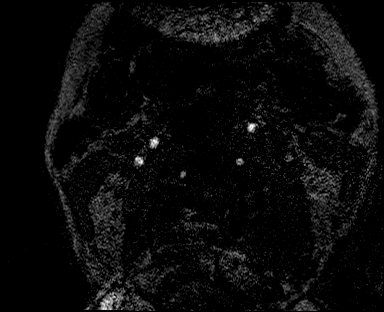
[im 93/133]
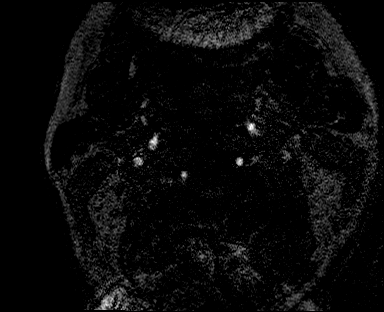
[im 96/133]
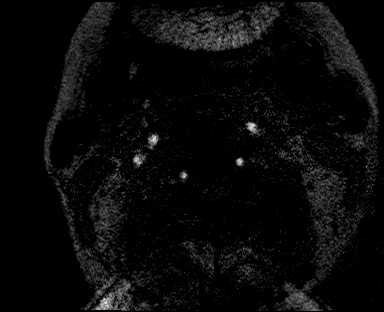
[im 99/133]
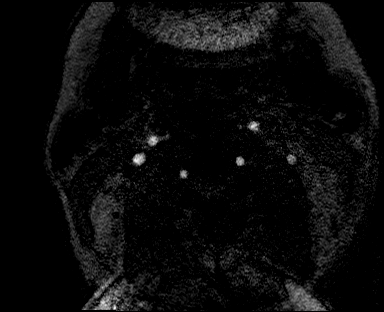
[im 102/133]
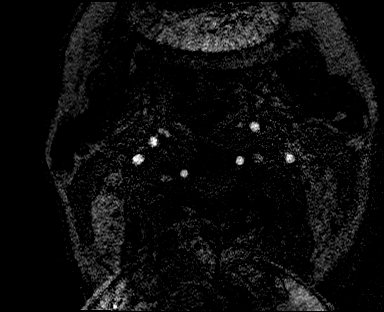
[im 104/133]
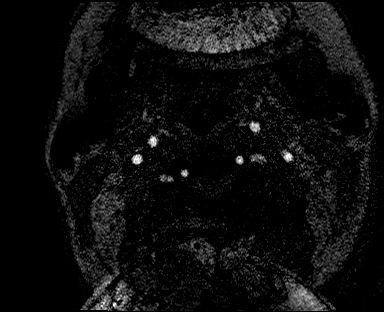
[im 107/133]
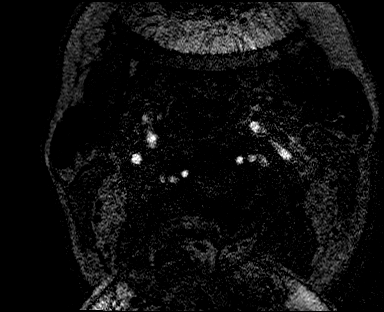
[im 110/133]
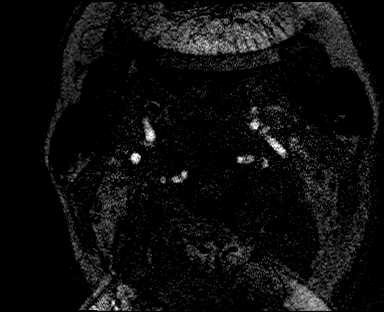
[im 113/133]
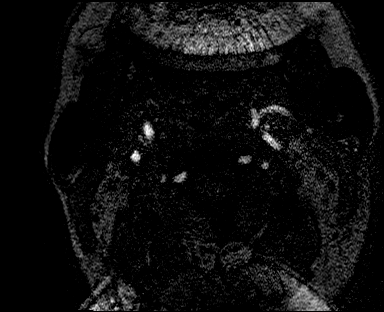
[im 127/133]
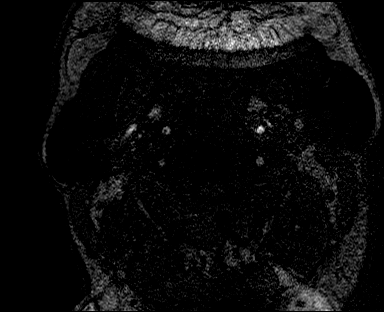

[42 of 48 positions shown; findings below may reference images not displayed]

FINDINGS: Time-of-flight images of the carotid and vertebral arteries are
normal.
IMPRESSION: Normal MRA of the neck.

## 2020-06-01 IMAGING — MR MR HEAD W/O CM
12 of 13 series · 44 of 48 positions shown · non-contrast
Comparison: None.

CLINICAL DATA: Slurred speech

EXAM:
MRI HEAD WITHOUT CONTRAST
TECHNIQUE: Multiplanar, multiecho pulse sequences of the brain and surrounding
structures were obtained without intravenous contrast.

[Series 5: DWI · axial · 3.0mm · 0.88mm/px · z∈[-112,+46]mm · 7 of 108 slices shown (1 of 4)]
[im 1/108]
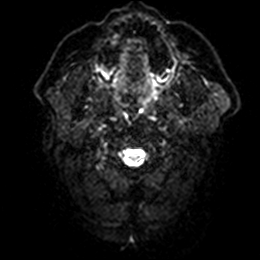
[im 18/108]
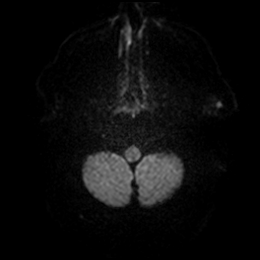
[im 36/108]
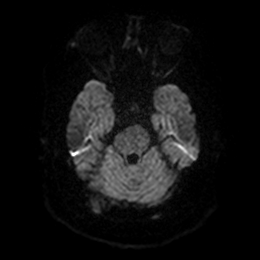
[im 54/108]
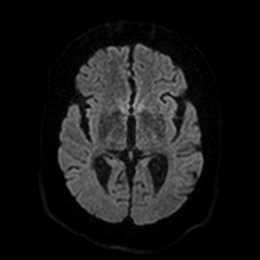
[im 72/108]
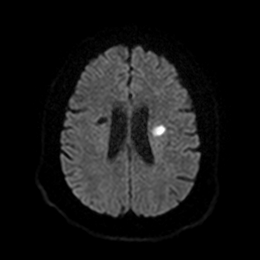
[im 90/108]
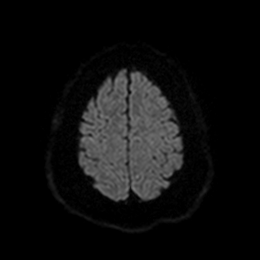
[im 108/108]
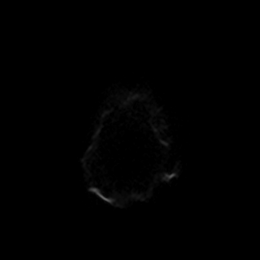

[Series 6: DWI · axial · 3.0mm · 0.88mm/px · z∈[-112,+46]mm · 4 of 54 slices shown (2 of 4)]
[im 1/54]
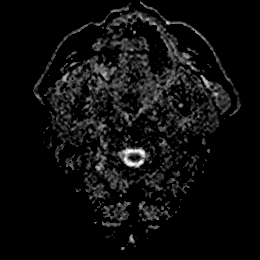
[im 18/54]
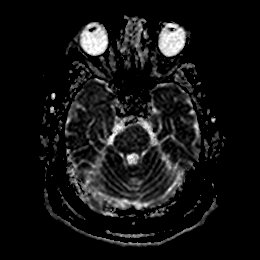
[im 36/54]
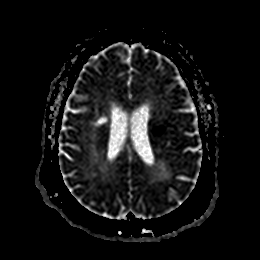
[im 54/54]
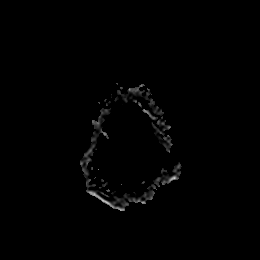

[Series 7: DWI · coronal · 4.0mm · 0.88mm/px · 6 of 76 slices shown (3 of 4)]
[im 1/76]
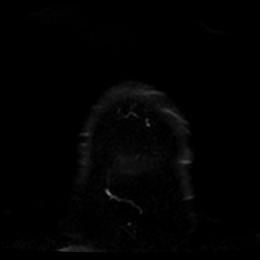
[im 16/76]
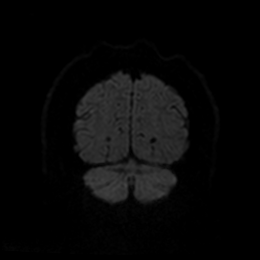
[im 31/76]
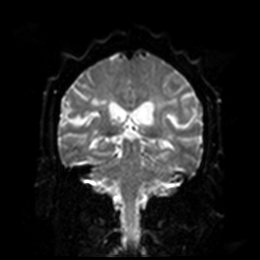
[im 46/76]
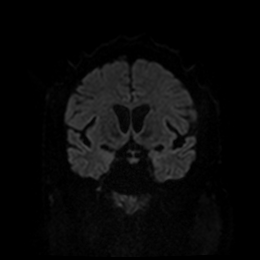
[im 61/76]
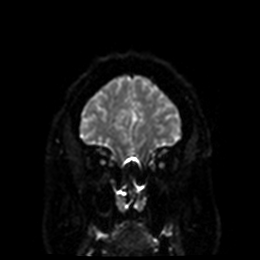
[im 76/76]
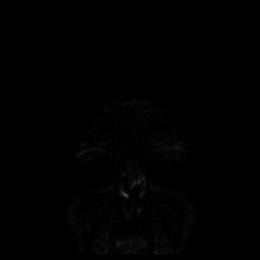

[Series 8: DWI · coronal · 4.0mm · 0.88mm/px · 3 of 38 slices shown (4 of 4)]
[im 1/38]
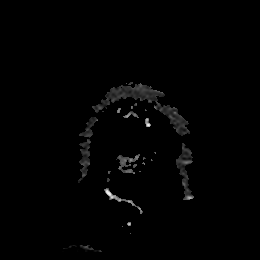
[im 19/38]
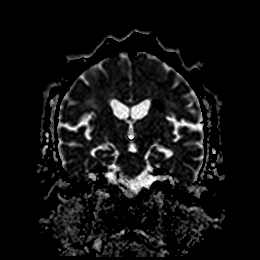
[im 38/38]
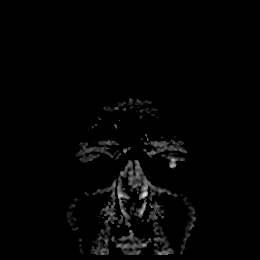

[Series 9: T1 · sagittal · 5.0mm · 0.75mm/px · 2 of 25 slices shown]
[im 1/25]
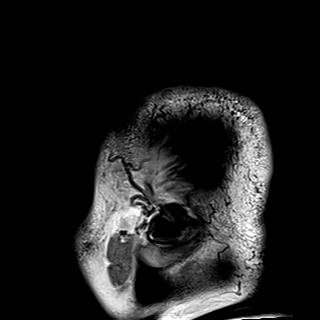
[im 25/25]
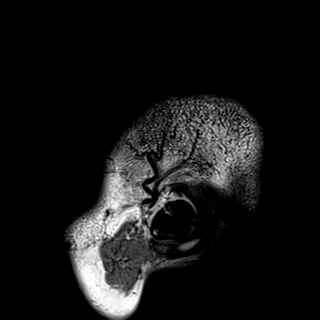

[Series 10: T2 · axial · 5.0mm · 0.72mm/px · z∈[-107,+48]mm · 2 of 27 slices shown (1 of 2)]
[im 1/27]
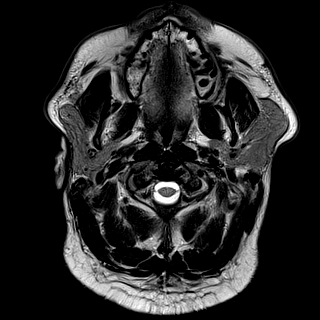
[im 27/27]
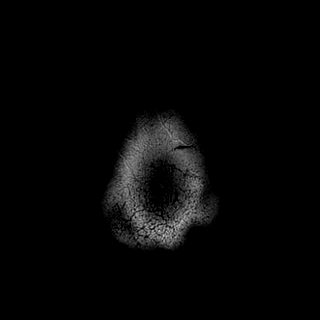

[Series 11: FLAIR · axial · 5.0mm · 0.45mm/px · z∈[-107,+48]mm · 2 of 27 slices shown]
[im 1/27]
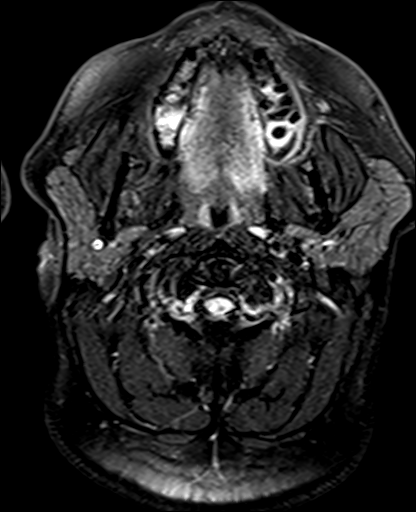
[im 27/27]
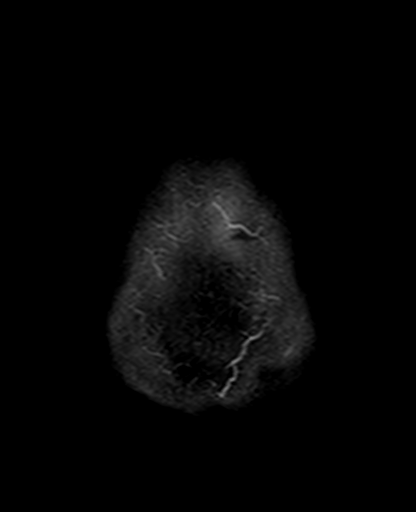

[Series 12: mag_images · axial · 3.0mm · 0.90mm/px · z∈[-116,+47]mm · 4 of 56 slices shown]
[im 1/56]
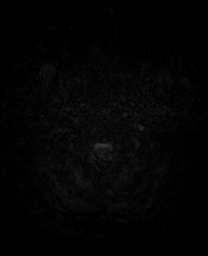
[im 19/56]
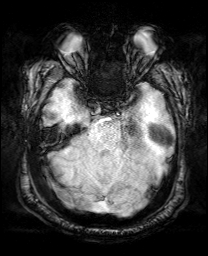
[im 37/56]
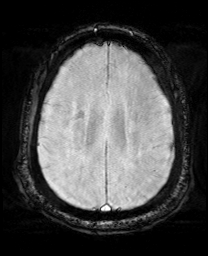
[im 56/56]
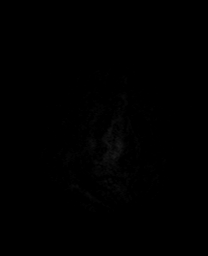

[Series 13: pha_images · axial · 3.0mm · 0.90mm/px · z∈[-113,+47]mm · 4 of 55 slices shown]
[im 1/55]
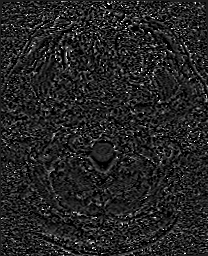
[im 19/55]
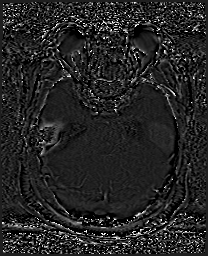
[im 37/55]
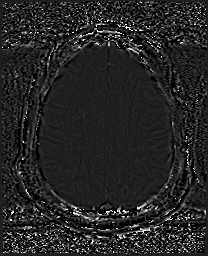
[im 55/55]
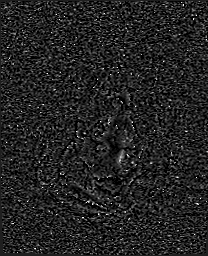

[Series 14: swi_images · axial · 3.0mm · 0.90mm/px · z∈[-116,+47]mm · 4 of 56 slices shown]
[im 1/56]
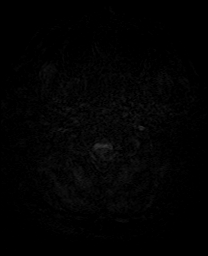
[im 19/56]
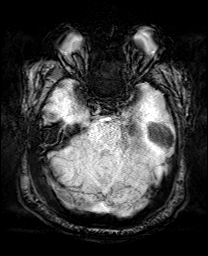
[im 37/56]
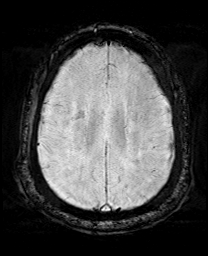
[im 56/56]
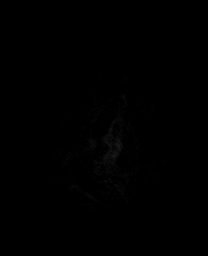

[Series 15: mip_images(sw) · axial · 24.0mm · 0.90mm/px · z∈[-105,+37]mm · 4 of 49 slices shown]
[im 1/49]
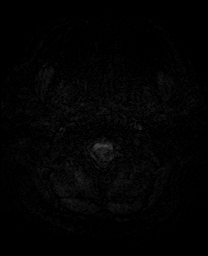
[im 17/49]
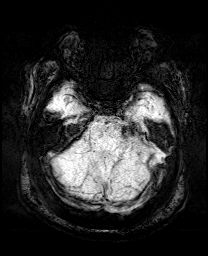
[im 33/49]
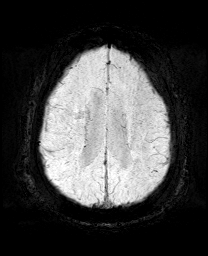
[im 49/49]
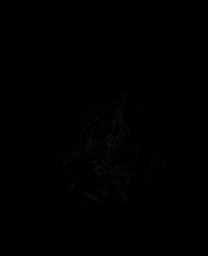

[Series 17: T2 · coronal · 5.0mm · 0.34mm/px · 2 of 31 slices shown (2 of 2)]
[im 1/31]
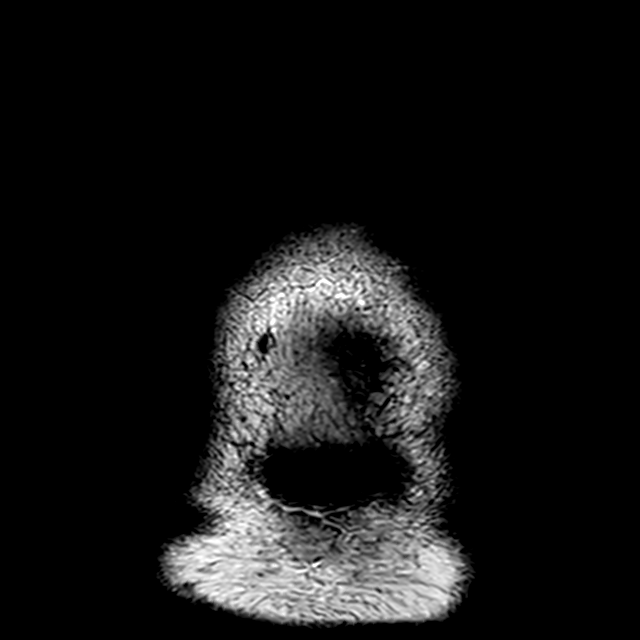
[im 31/31]
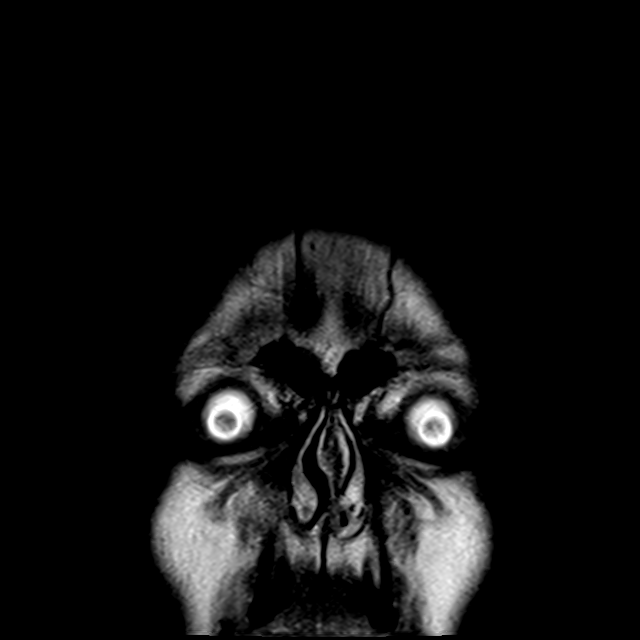

[44 of 48 positions shown; findings below may reference images not displayed]

FINDINGS: Brain: Small acute infarct of the left caudate body. Early confluent
hyperintense T2-weighted signal of the periventricular and deep
white matter, most commonly due to chronic ischemic microangiopathy.
There is an old right corona radiata small vessel infarct. Advanced
atrophy for age. No chronic microhemorrhage. Normal midline
structures.

Vascular: Normal flow voids.

Skull and upper cervical spine: Normal marrow signal.

Sinuses/Orbits: Negative.

Other: None.
IMPRESSION: 1. Small acute infarct of the left caudate body.
2. Advanced atrophy and chronic ischemic microangiopathy.

## 2020-06-01 IMAGING — MR MR MRA HEAD W/O CM
2 series · 18 of 48 positions shown · non-contrast
Comparison: None.

CLINICAL DATA: Neuro deficit, acute, stroke suspected

EXAM:
MRA HEAD WITHOUT CONTRAST
TECHNIQUE: Angiographic images of the Circle of Willis were obtained using MRA
technique without intravenous contrast.

[Series 5: 3d cow · axial · 0.5mm · 0.41mm/px · z∈[-106,-25]mm · 17 of 172 slices shown]
[im 1/172]
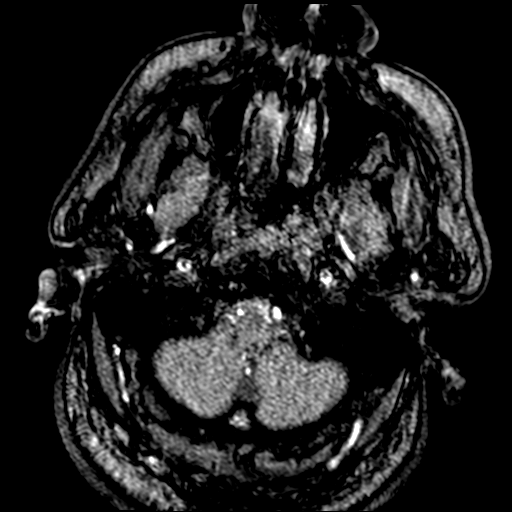
[im 4/172]
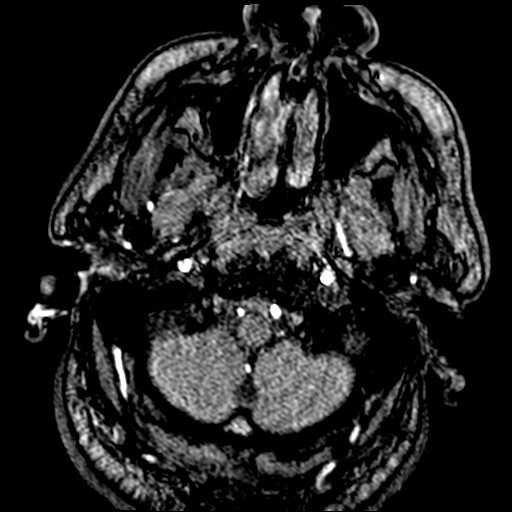
[im 8/172]
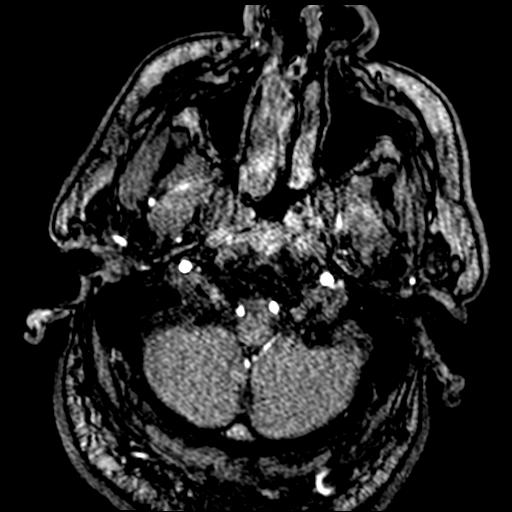
[im 12/172]
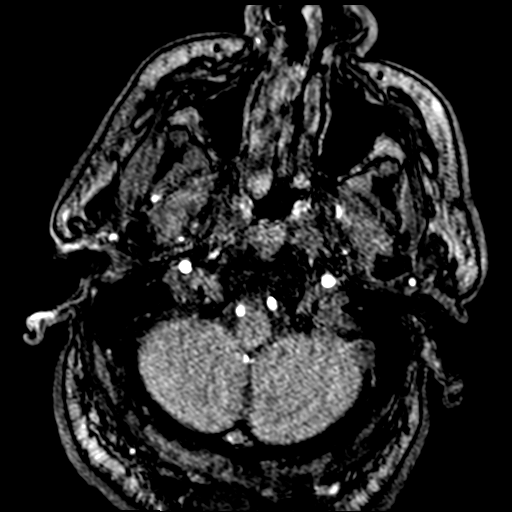
[im 15/172]
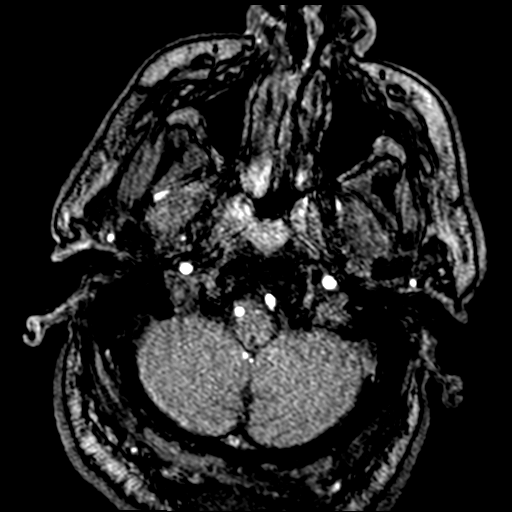
[im 19/172]
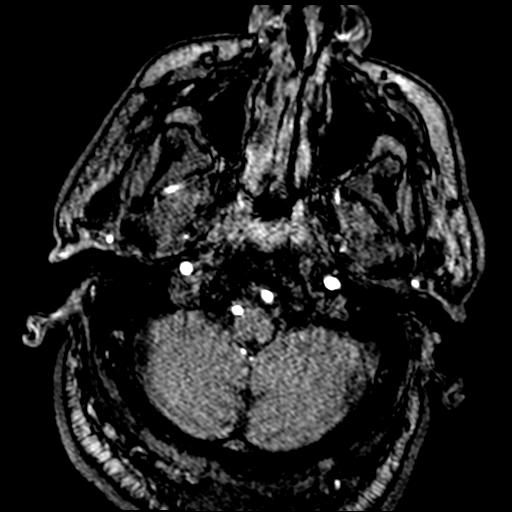
[im 23/172]
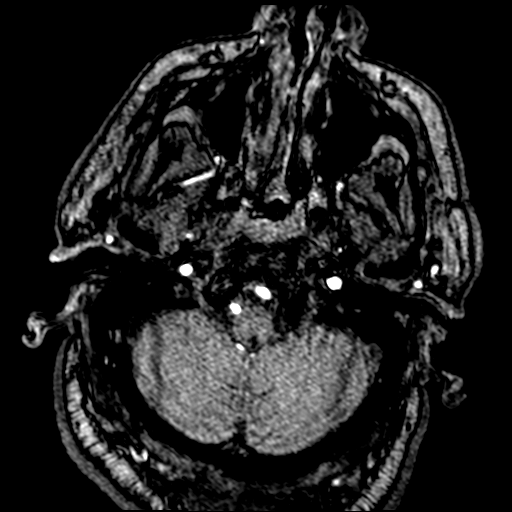
[im 27/172]
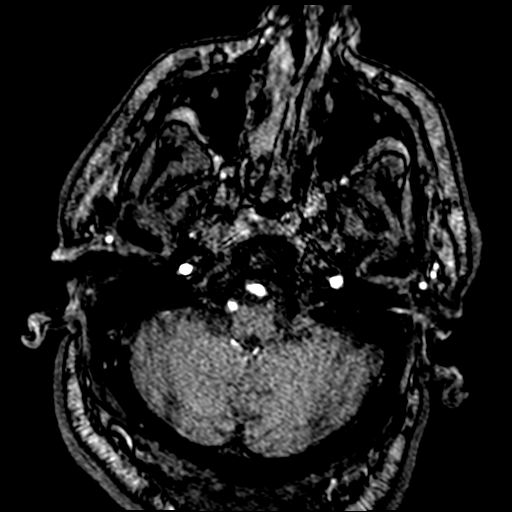
[im 30/172]
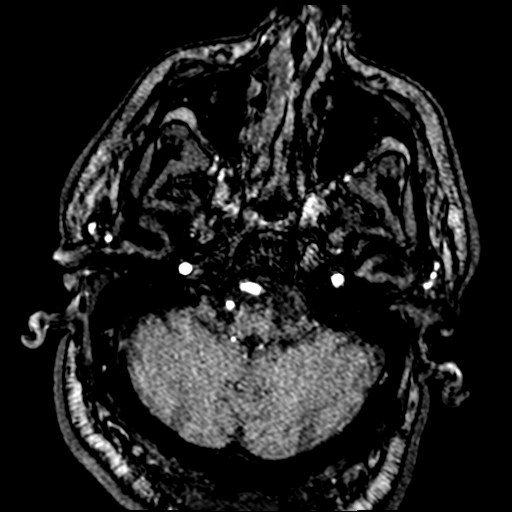
[im 53/172]
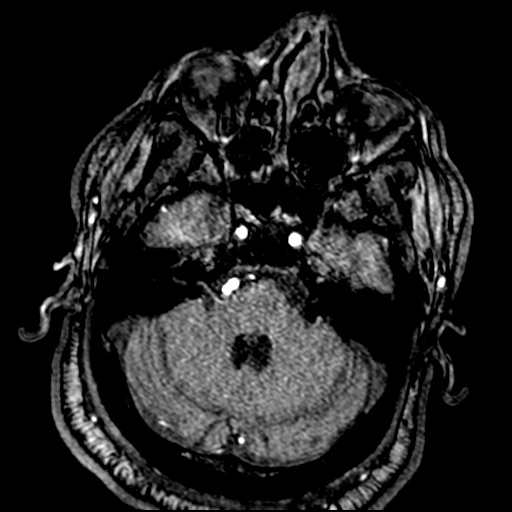
[im 75/172]
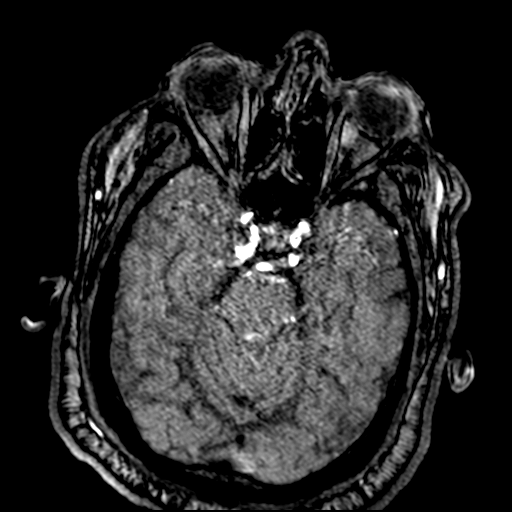
[im 86/172]
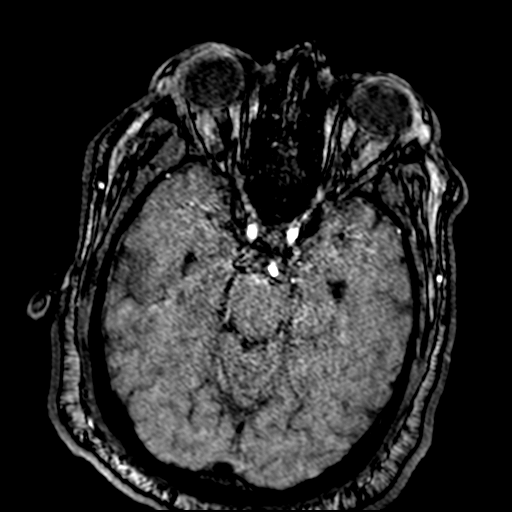
[im 97/172]
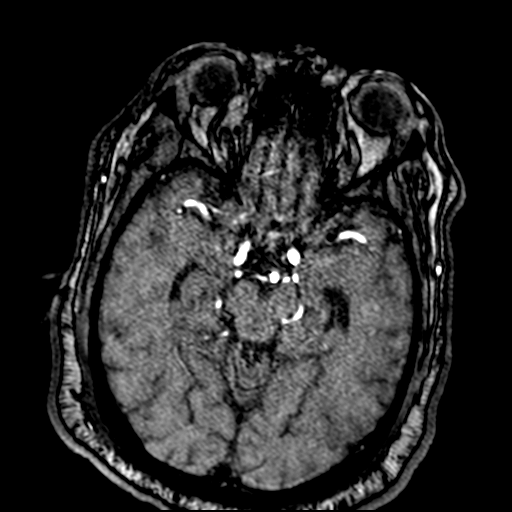
[im 119/172]
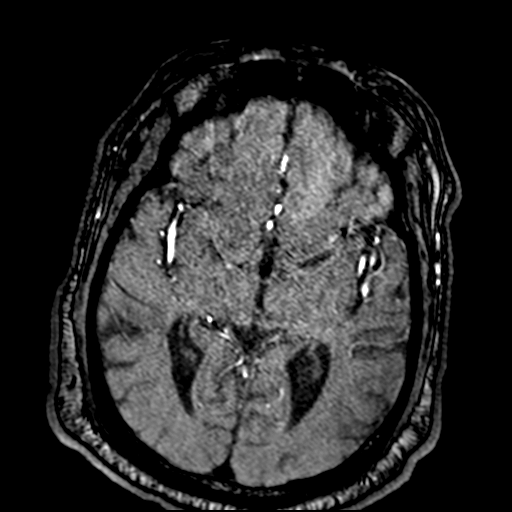
[im 142/172]
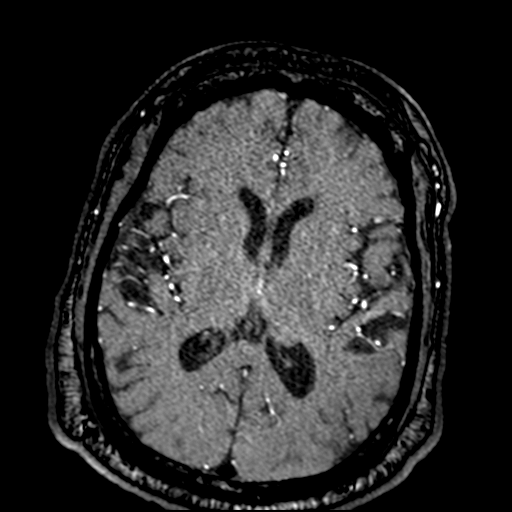
[im 145/172]
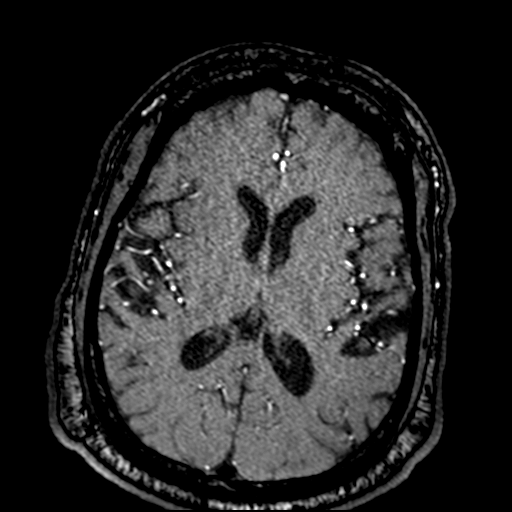
[im 164/172]
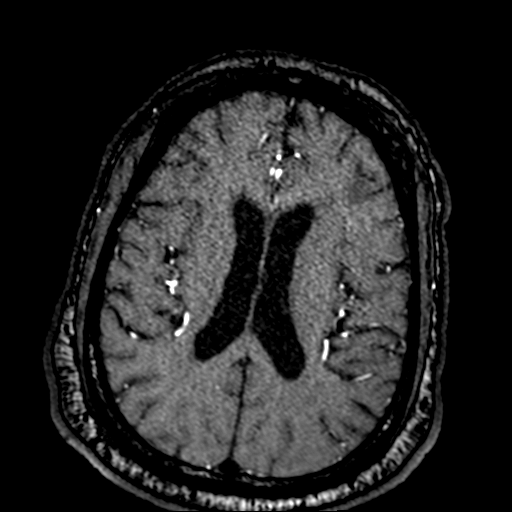

[Series 1043: mip tumble · axial · 0.4mm · 0.18mm/px · 1 of 1 slices shown]
[im 1/1]
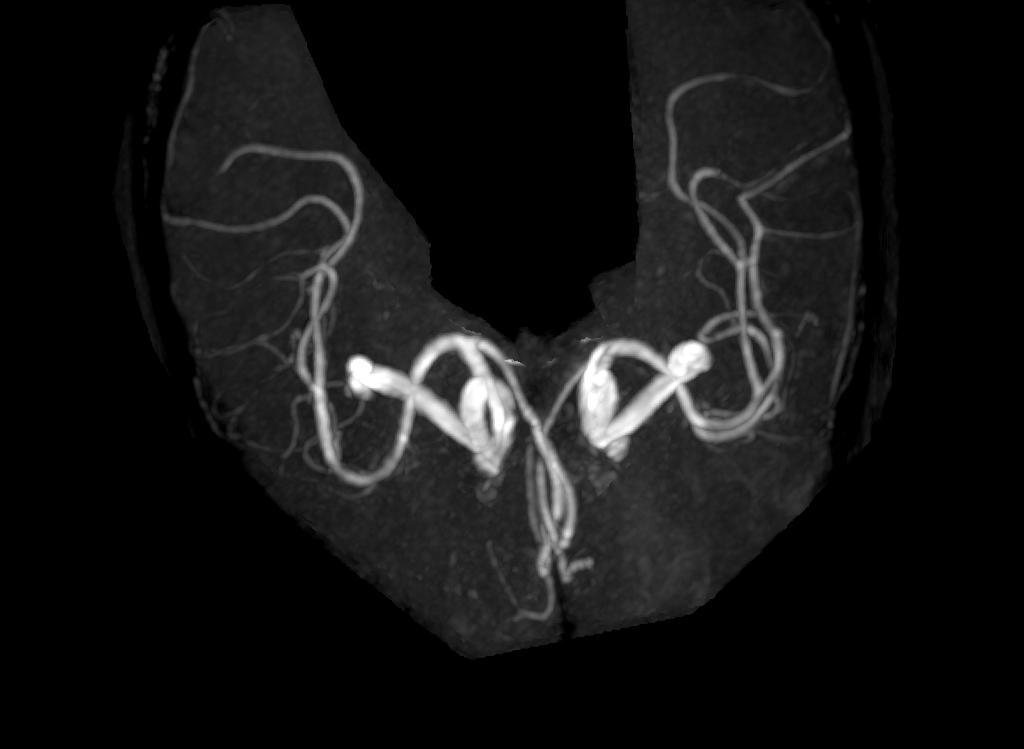

[18 of 48 positions shown; findings below may reference images not displayed]

FINDINGS: POSTERIOR CIRCULATION:

--Vertebral arteries: Normal V4 segments.

--Inferior cerebellar arteries: Normal.

--Basilar artery: Normal.

--Superior cerebellar arteries: Normal.

--Posterior cerebral arteries: Normal.

ANTERIOR CIRCULATION:

--Intracranial internal carotid arteries: Normal.

--Anterior cerebral arteries (ACA): Normal. Both A1 segments are
present. Patent anterior communicating artery (a-comm).

--Middle cerebral arteries (MCA): Normal.
IMPRESSION: Normal intracranial MRA.

## 2020-06-01 IMAGING — MR MR CERVICAL SPINE W/O CM
5 series · 40 of 48 positions shown · non-contrast
Comparison: None.

CLINICAL DATA: Myelopathy, acute or progressive

EXAM:
MRI CERVICAL SPINE WITHOUT CONTRAST
TECHNIQUE: Multiplanar, multisequence MR imaging of the cervical spine was
performed. No intravenous contrast was administered.

[Series 10: T1 · sagittal · 3.0mm · 0.69mm/px · 6 of 15 slices shown]
[im 1/15]
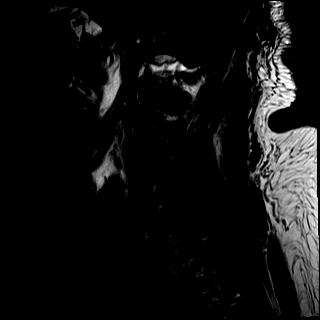
[im 3/15]
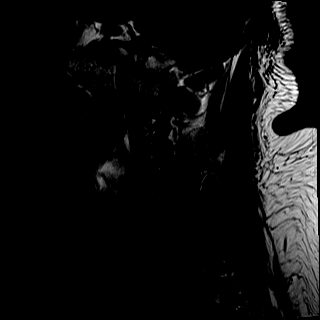
[im 6/15]
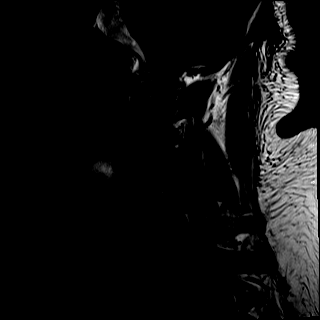
[im 9/15]
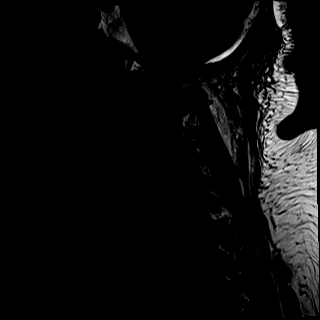
[im 12/15]
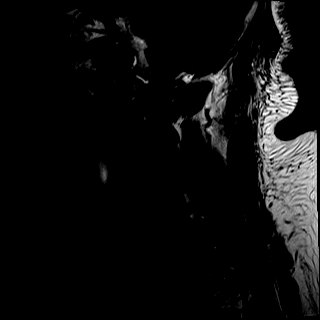
[im 15/15]
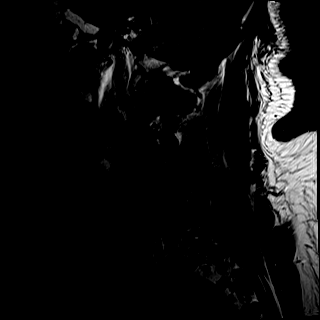

[Series 11: T2 · sagittal · 3.0mm · 0.69mm/px · 6 of 15 slices shown (1 of 2)]
[im 1/15]
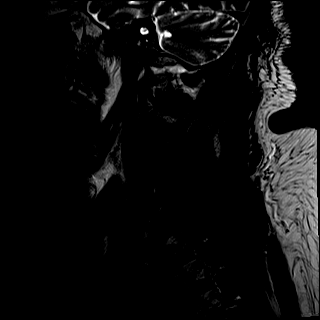
[im 3/15]
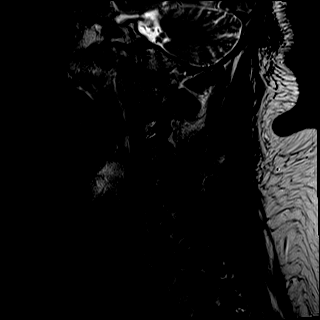
[im 6/15]
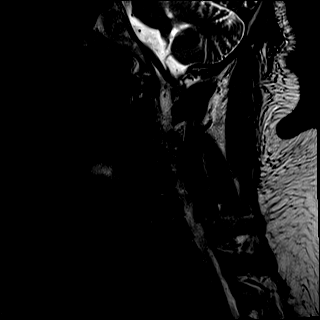
[im 9/15]
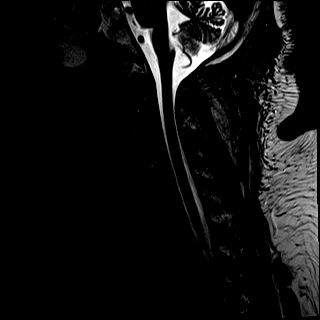
[im 12/15]
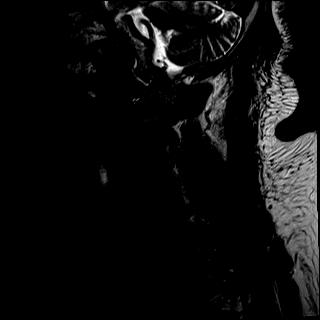
[im 15/15]
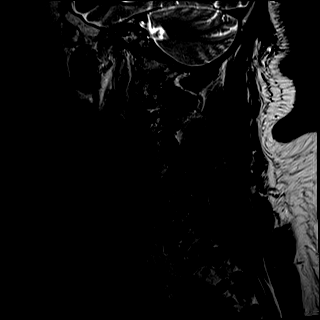

[Series 12: STIR · sagittal · 3.0mm · 0.86mm/px · 6 of 15 slices shown]
[im 1/15]
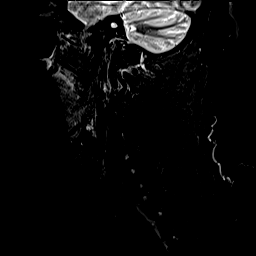
[im 3/15]
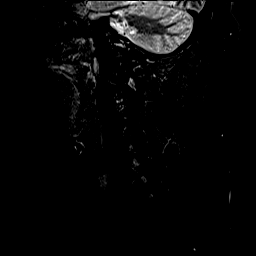
[im 6/15]
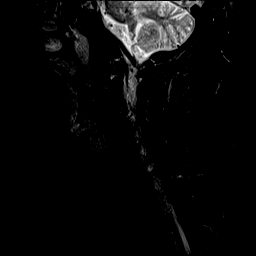
[im 9/15]
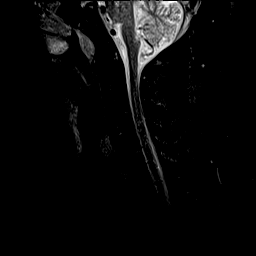
[im 12/15]
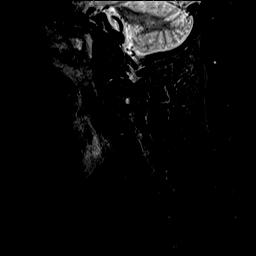
[im 15/15]
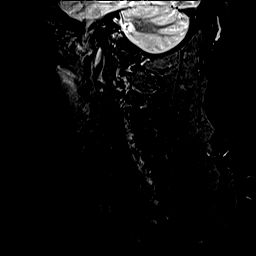

[Series 13: T2 · axial · 3.0mm · 0.66mm/px · z∈[-290,-183]mm · 14 of 35 slices shown (2 of 2)]
[im 1/35]
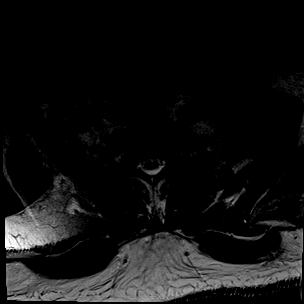
[im 3/35]
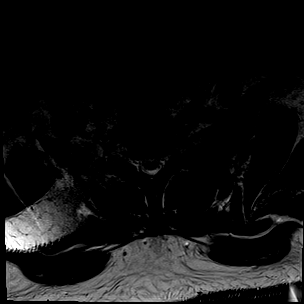
[im 5/35]
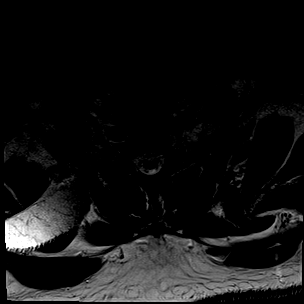
[im 8/35]
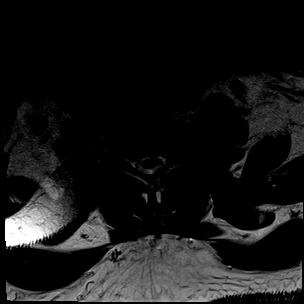
[im 10/35]
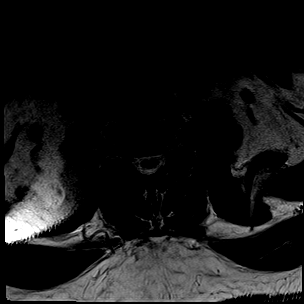
[im 13/35]
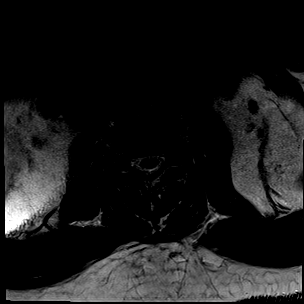
[im 15/35]
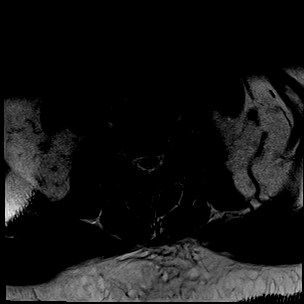
[im 18/35]
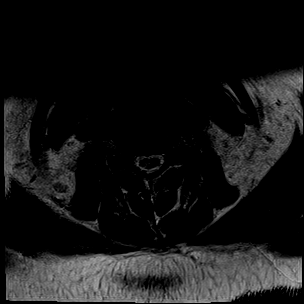
[im 20/35]
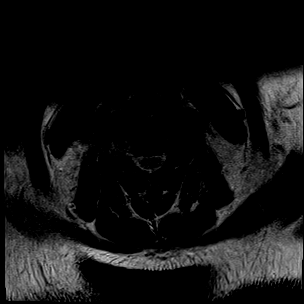
[im 22/35]
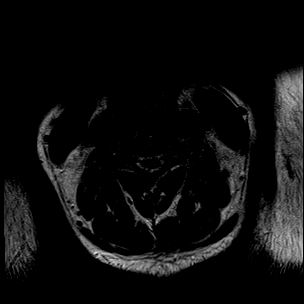
[im 25/35]
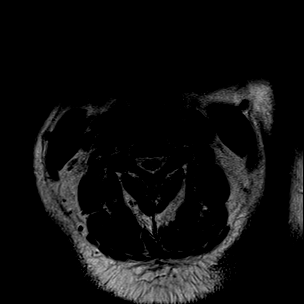
[im 27/35]
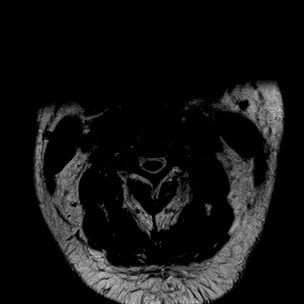
[im 30/35]
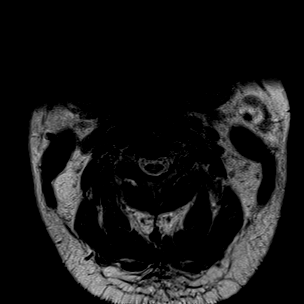
[im 35/35]
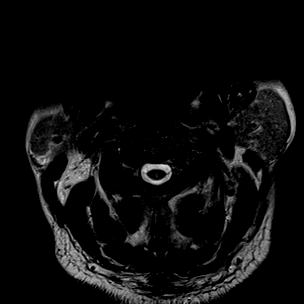

[Series 14: GRE · axial · 3.0mm · 0.39mm/px · z∈[-290,-183]mm · 8 of 35 slices shown]
[im 1/35]
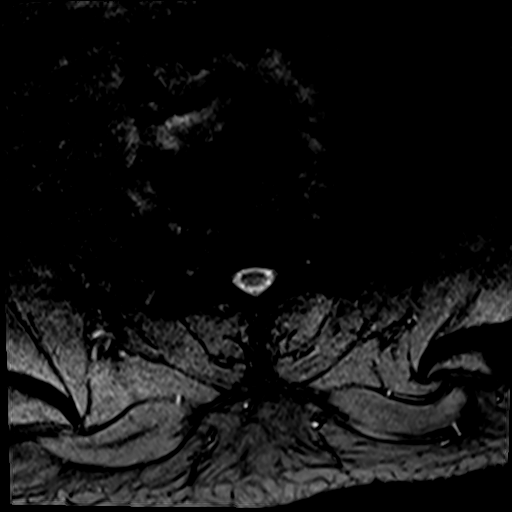
[im 5/35]
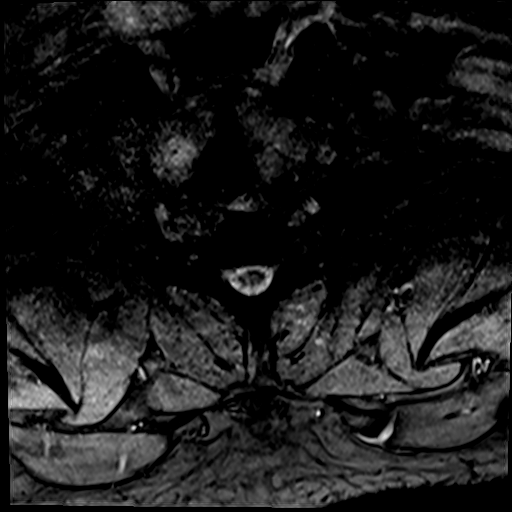
[im 10/35]
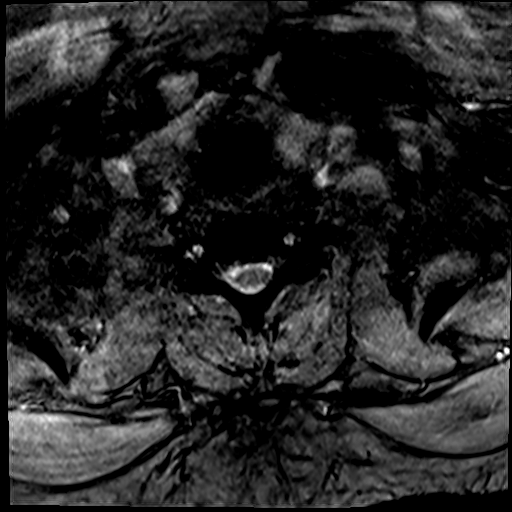
[im 15/35]
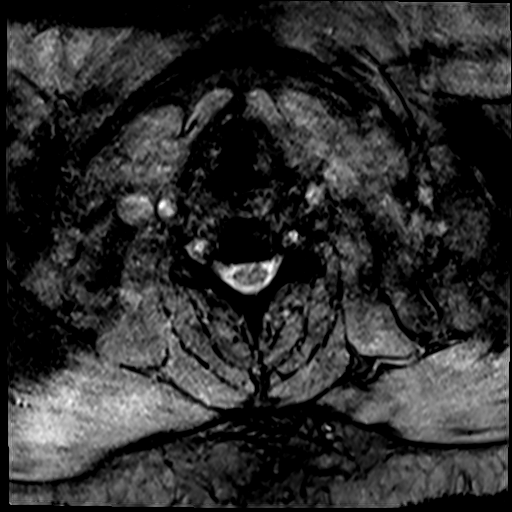
[im 20/35]
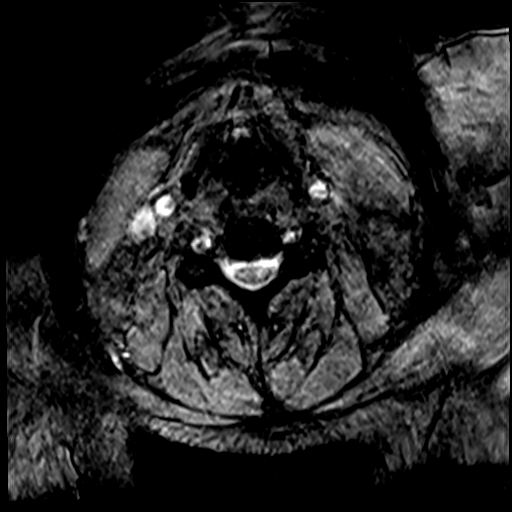
[im 25/35]
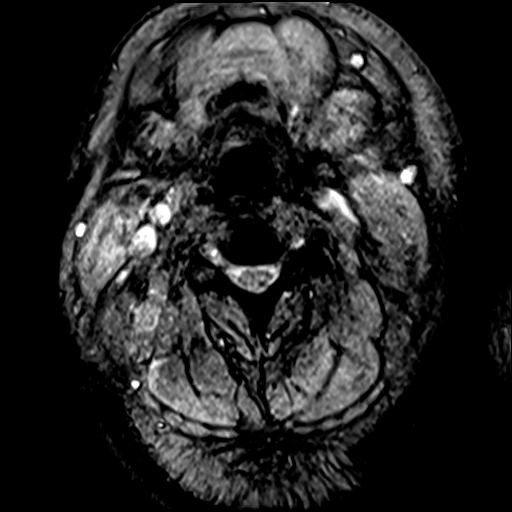
[im 30/35]
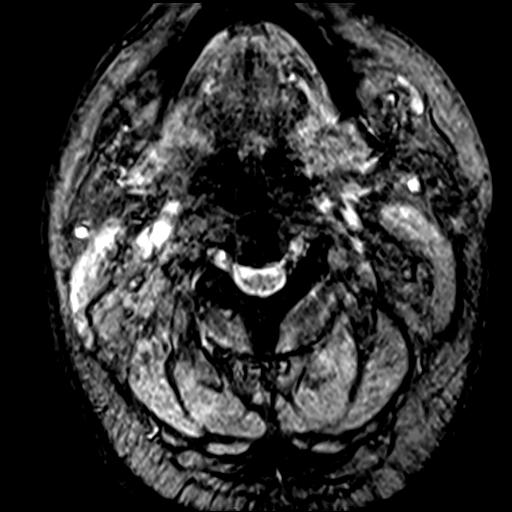
[im 35/35]
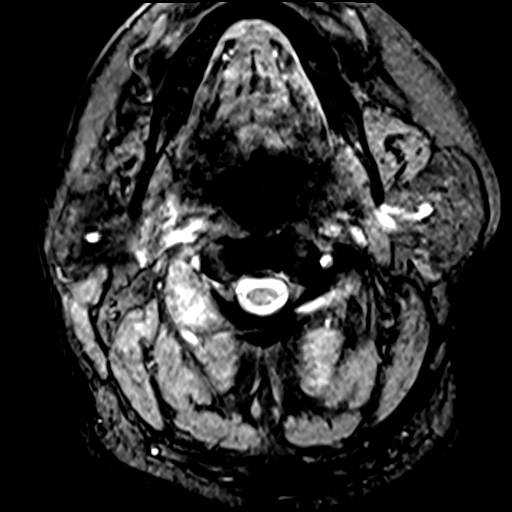

[40 of 48 positions shown; findings below may reference images not displayed]

FINDINGS: Alignment: Physiologic.

Vertebrae: No fracture, evidence of discitis, or bone lesion.

Cord: Normal signal and morphology.

Posterior Fossa, vertebral arteries, paraspinal tissues: Negative.

Disc levels:

There is no disc herniation, spinal canal stenosis or neural
impingement.
IMPRESSION: Normal MRI of the cervical spine.

## 2020-06-01 MED ORDER — LORAZEPAM 2 MG/ML IJ SOLN
1.0000 mg | Freq: Once | INTRAMUSCULAR | Status: AC | PRN
  Administered 2020-06-01: 1 mg via INTRAVENOUS
  Filled 2020-06-01: qty 1

## 2020-06-01 MED ORDER — STROKE: EARLY STAGES OF RECOVERY BOOK
Freq: Once | Status: AC
  Filled 2020-06-01: qty 1

## 2020-06-01 MED ORDER — DILTIAZEM HCL 30 MG PO TABS
30.0000 mg | ORAL_TABLET | Freq: Four times a day (QID) | ORAL | Status: DC
  Administered 2020-06-02 (×3): 30 mg via ORAL
  Filled 2020-06-01 (×3): qty 1

## 2020-06-01 MED ORDER — ACETAMINOPHEN 160 MG/5ML PO SOLN: 650.0000 mg | ORAL | Status: DC | PRN

## 2020-06-01 MED ORDER — LISINOPRIL 20 MG PO TABS
40.0000 mg | ORAL_TABLET | Freq: Every day | ORAL | Status: DC
  Administered 2020-06-02: 40 mg via ORAL
  Filled 2020-06-01: qty 2

## 2020-06-01 MED ORDER — SODIUM CHLORIDE 0.9% FLUSH
3.0000 mL | Freq: Once | INTRAVENOUS | Status: AC
  Administered 2020-06-01: 3 mL via INTRAVENOUS

## 2020-06-01 MED ORDER — GLIPIZIDE 5 MG PO TABS
10.0000 mg | ORAL_TABLET | Freq: Two times a day (BID) | ORAL | Status: DC
  Administered 2020-06-02 (×2): 10 mg via ORAL
  Filled 2020-06-01 (×3): qty 2

## 2020-06-01 MED ORDER — FLECAINIDE ACETATE 100 MG PO TABS
100.0000 mg | ORAL_TABLET | Freq: Two times a day (BID) | ORAL | Status: DC
  Administered 2020-06-02: 100 mg via ORAL
  Filled 2020-06-01 (×3): qty 1

## 2020-06-01 MED ORDER — ASPIRIN 81 MG PO CHEW
81.0000 mg | CHEWABLE_TABLET | Freq: Once | ORAL | Status: AC
  Administered 2020-06-01: 81 mg via ORAL
  Filled 2020-06-01: qty 1

## 2020-06-01 MED ORDER — ACETAMINOPHEN 325 MG PO TABS: 650.0000 mg | ORAL_TABLET | ORAL | Status: DC | PRN

## 2020-06-01 MED ORDER — RIVAROXABAN 20 MG PO TABS: 20.0000 mg | ORAL_TABLET | Freq: Every day | ORAL | Status: DC

## 2020-06-01 MED ORDER — PANTOPRAZOLE SODIUM 40 MG PO TBEC
40.0000 mg | DELAYED_RELEASE_TABLET | Freq: Every day | ORAL | Status: DC
  Administered 2020-06-02: 40 mg via ORAL
  Filled 2020-06-01: qty 1

## 2020-06-01 MED ORDER — SENNOSIDES-DOCUSATE SODIUM 8.6-50 MG PO TABS: 1.0000 | ORAL_TABLET | Freq: Every evening | ORAL | Status: DC | PRN

## 2020-06-01 MED ORDER — ACETAMINOPHEN 650 MG RE SUPP: 650.0000 mg | RECTAL | Status: DC | PRN

## 2020-06-01 MED ORDER — AMPHETAMINE-DEXTROAMPHET ER 10 MG PO CP24: 30.0000 mg | ORAL_CAPSULE | Freq: Every day | ORAL | Status: DC | PRN

## 2020-06-01 MED ORDER — ATORVASTATIN CALCIUM 10 MG PO TABS: 10.0000 mg | ORAL_TABLET | Freq: Every day | ORAL | Status: DC

## 2020-06-01 MED ORDER — ASPIRIN 325 MG PO TABS
325.0000 mg | ORAL_TABLET | Freq: Every day | ORAL | Status: DC
  Administered 2020-06-02: 325 mg via ORAL
  Filled 2020-06-01: qty 1

## 2020-06-01 MED ORDER — SODIUM CHLORIDE 0.9 % IV SOLN: INTRAVENOUS | Status: DC

## 2020-06-01 MED ORDER — METOPROLOL SUCCINATE ER 50 MG PO TB24
50.0000 mg | ORAL_TABLET | Freq: Every day | ORAL | Status: DC
  Administered 2020-06-02: 50 mg via ORAL
  Filled 2020-06-01: qty 1

## 2020-06-01 MED ORDER — SODIUM CHLORIDE 0.9 % IV BOLUS
1000.0000 mL | Freq: Once | INTRAVENOUS | Status: AC
  Administered 2020-06-01: 1000 mL via INTRAVENOUS

## 2020-06-01 MED ORDER — ATORVASTATIN CALCIUM 80 MG PO TABS: 80.0000 mg | ORAL_TABLET | Freq: Every day | ORAL | Status: DC

## 2020-06-01 NOTE — H&P (Signed)
History and Physical   [name] GLO:[id number] DOB: Mar 09, 1971 DOA: 06/01/2020  Referring MD/NP/PA: Dr. Rodena MedinMessick  PCP: Renford DillsPolite, Ronald, MD   Outpatient Specialists: None  Patient coming from: Home  Chief Complaint: Persistent dysarthric speech  HPI: Douglas BatemanLeonidas [name] is a 49 y.o. male with medical history significant of diabetes, hypertension, morbid obesity, GERD, history of polysubstance abuse including cocaine and alcohol, who presents to the ER with onset of dysarthric speech that started yesterday around 10:00 in the morning.  Patient did not think of it as anything serious.  He was working on Psychologist, occupationalthe lawn when he started feeling fatigue.  He sat down initially.  [facility] and noted that he could not communicate himself very well to the cashier.  He was not making enough sense.  Following that multiple people told him his speech was slurred.  Patient also noted being slow dealing with other people and his thought process.  He had one episode of dropping stuff to the floor and feeling clumsy.  Has also almost had a problem while driving so this all made him come to the ER for evaluation.  In the ER he was initially worked up.  Due to his history of polysubstance abuse this was evaluated.  But patient denied using any substances including alcohol.  Head CT on subsequent MRI of the brain now showed small acute CVA.  Neurology consulted and patient is being admitted for evaluation and treatment of possible CVA.  ED Course: Temperature 98.8 blood pressure 184/85 pulse 77 respirate of 20 oxygen sat 95% on room air CBC entirely within normal.  Sodium 137 potassium is 5.4 chloride 103 CO2 23 BUN 35 creatinine 1.30 and calcium 8.9.  Urine drug screen is negative COVID-19 negative.  MRI of the brain showed small acute infarct of the left caudate body.  Subsequent MRI of the cervical spine and MRA of the brain.  So far negative.  Neurology consulted patient initiated on  statin and being admitted for evaluation of stroke.  Review of Systems: As per HPI otherwise 10 point review of systems negative.    Past Medical History:  Diagnosis Date  . Diabetes mellitus   . GERD (gastroesophageal reflux disease)   . History of alcohol abuse   . History of cocaine use   . History of echocardiogram    a. Echo 4/14: Moderate LVH, vigorous LVEF, EF 65-70%, normal wall motion, grade 2 diastolic dysfunction, mildly dilated aortic root and ascending aorta, ascending aorta 40 mm, aortic root 38 mm, mild LAE  . Hx of cardiovascular stress test    a. GXT 5/14: No ischemic changes  //  b. ETT-Myoview 3/16:  Low risk, no ischemia, EF 58%  . Hyperlipidemia   . Hypertension   . Morbid obesity (HCC)   . Paroxysmal atrial fibrillation (HCC)    Occurring in 2008, with several recurrence since then (including in the setting of + cocaine on UDS).  . Sleep apnea    uses CPAP  . SVT (supraventricular tachycardia) (HCC)    mid to long RP SVT 09/2019    Past Surgical History:  Procedure Laterality Date  . APPENDECTOMY    . ATRIAL FIBRILLATION ABLATION N/A 11/27/2019   Procedure: ATRIAL FIBRILLATION ABLATION;  Surgeon: Hillis RangeAllred, James, MD;  Location: [facility];  Service: Cardiovascular;  Laterality: N/A;  . ROTATOR CUFF REPAIR    . SVT ABLATION N/A 11/27/2019   Procedure: SVT ABLATION;  Surgeon: Johney FrameAllred,  Fayrene Fearing, MD;  Location: [facility];  Service: Cardiovascular;  Laterality: N/A;  . TONSILLECTOMY       reports that he has been smoking cigarettes. He has a 14.00 pack-year smoking history. He has quit using smokeless tobacco.  His smokeless tobacco use included snuff. He reports previous alcohol use of about 6.0 standard drinks of alcohol per week. He reports previous drug use.  Allergies  Allergen Reactions  . Shrimp [Shellfish Allergy] Anaphylaxis  . Banana Other (See Comments)    Pt gags  . Watermelon Flavor Nausea And Vomiting  . Peanut-Containing Drug  Products Itching and Cough  . Almond Oil Itching    Roof of mouth itches  . Sulfa Antibiotics Itching    Family History  Problem Relation Age of Onset  . Diabetes Mother   . Heart attack Father 31  . Stroke Maternal Grandmother   . Stroke Paternal Grandmother   . Diabetes Paternal Grandfather      Prior to Admission medications   Medication Sig Start Date End Date Taking? Authorizing Provider  amphetamine-dextroamphetamine (ADDERALL XR) 30 MG 24 hr capsule Take 1 capsule (30 mg total) by mouth daily as needed (focus). 1 daily as needed Patient taking differently: Take 30 mg by mouth daily as needed (focus).  05/17/20  Yes Young, Joni Fears D, MD  atorvastatin (LIPITOR) 10 MG tablet Take 10 mg by mouth daily. 09/16/19  Yes [provider]  Blood Pressure Monitoring (BLOOD PRESSURE MONITOR/L CUFF) MISC 1 Units by Does not apply route daily. 03/19/19  Yes Jeannie Fend, PA-C  diltiazem (CARDIZEM) 30 MG tablet Take 1 Tablet Every 4 Hours As Needed For HR >100 Patient taking differently: Take 30 mg by mouth every 4 (four) hours as needed. For HR >100 10/30/19  Yes Fenton, Clint R, PA  flecainide (TAMBOCOR) 100 MG tablet Take 1 tablet (100 mg total) by mouth 2 (two) times daily. 03/29/20  Yes Allred, Fayrene Fearing, MD  glipiZIDE (GLUCOTROL) 10 MG tablet Take 10 mg by mouth 2 (two) times daily. 01/06/19  Yes [provider]  lisinopril (ZESTRIL) 40 MG tablet Take 40 mg by mouth daily. 10/14/19  Yes [provider]  metFORMIN (GLUCOPHAGE) 500 MG tablet Take 1,000 mg by mouth 2 (two) times daily with a meal.  01/06/19  Yes [provider]  metoprolol succinate (TOPROL-XL) 50 MG 24 hr tablet Take 1 tablet (50 mg total) by mouth daily. Take with or immediately following a meal. 03/29/20 06/27/20 Yes Allred, Fayrene Fearing, MD  pantoprazole (PROTONIX) 40 MG tablet TAKE 1 TABLET BY MOUTH EVERY DAY Patient taking differently: Take 40 mg by mouth daily.  12/22/19  Yes Allred, Fayrene Fearing, MD  XARELTO  20 MG TABS tablet TAKE 1 TABLET (20 MG TOTAL) BY MOUTH DAILY WITH SUPPER. Patient taking differently: Take 20 mg by mouth daily with supper.  04/05/20  Yes Allred, Fayrene Fearing, MD  doxycycline (VIBRAMYCIN) 100 MG capsule Take 1 capsule (100 mg total) by mouth 2 (two) times daily. One po bid x 7 days Patient not taking: Reported on 06/01/2020 05/01/20   Dartha Lodge, PA-C  ondansetron (ZOFRAN ODT) 8 MG disintegrating tablet Take 1 tablet (8 mg total) by mouth every 8 (eight) hours as needed for nausea or vomiting. Patient not taking: Reported on 06/01/2020 05/04/20   Linwood Dibbles, MD    Physical Exam: Vitals:   06/01/20 1456 06/01/20 1619 06/01/20 2129 06/01/20 2348  BP: 118/80 124/84 (!) 184/85 (!) 148/105  Pulse: 70 72 74 77  Resp: 15 18 18 19   Temp: 98.2 F (36.8 C)   98.3 F (36.8 C)  TempSrc: Oral   Oral  SpO2: 99% 100% 97% 99%  Weight:      Height:          Constitutional: Morbidly obese, no distress Vitals:   06/01/20 1456 06/01/20 1619 06/01/20 2129 06/01/20 2348  BP: 118/80 124/84 (!) 184/85 (!) 148/105  Pulse: 70 72 74 77  Resp: 15 18 18 19   Temp: 98.2 F (36.8 C)   98.3 F (36.8 C)  TempSrc: Oral   Oral  SpO2: 99% 100% 97% 99%  Weight:      Height:       Eyes: PERRL, lids and conjunctivae normal ENMT: Mucous membranes are moist. Posterior pharynx clear of any exudate or lesions.Normal dentition.  Neck: normal, supple, no masses, no thyromegaly Respiratory: clear to auscultation bilaterally, no wheezing, no crackles. Normal respiratory effort. No accessory muscle use.  Cardiovascular: Regular rate and rhythm, no murmurs / rubs / gallops. No extremity edema. 2+ pedal pulses. No carotid bruits.  Abdomen: no tenderness, no masses palpated. No hepatosplenomegaly. Bowel sounds positive.  Musculoskeletal: no clubbing / cyanosis. No joint deformity upper and lower extremities. Good ROM, no contractures. Normal muscle tone.  Skin: no rashes, lesions, ulcers. No  induration Neurologic: CN 2-12 grossly intact. Sensation intact, power is 4 out of 5 in the left hand and upper extremity.  Also 4-1/2 out of 5 in his left lower extremity.   Psychiatric: Normal judgment and insight. Alert and oriented x 3. Normal mood.     Labs on Admission: I have personally reviewed following labs and imaging studies  CBC: Recent Labs  Lab 06/01/20 1010 06/01/20 1047  WBC 7.3  --   NEUTROABS 4.5  --   HGB 13.0 13.3  HCT 40.4 39.0  MCV 86.0  --   PLT 315  --    Basic Metabolic Panel: Recent Labs  Lab 06/01/20 1010 06/01/20 1047  NA 133* 137  K 5.7* 5.4*  CL 102 103  CO2 23  --   GLUCOSE 335* 337*  BUN 26* 35*  CREATININE 1.40* 1.30*  CALCIUM 8.9  --    GFR: Estimated Creatinine Clearance: 88.9 mL/min (A) (by C-G formula based on SCr of 1.3 mg/dL (H)). Liver Function Tests: Recent Labs  Lab 06/01/20 1010  AST 25  ALT 22  ALKPHOS 121  BILITOT 1.0  PROT 7.4  ALBUMIN 3.2*   No results for input(s): LIPASE, AMYLASE in the last 168 hours. No results for input(s): AMMONIA in the last 168 hours. Coagulation Profile: Recent Labs  Lab 06/01/20 1010  INR 1.9*   Cardiac Enzymes: No results for input(s): CKTOTAL, CKMB, CKMBINDEX, TROPONINI in the last 168 hours. BNP (last 3 results) No results for input(s): PROBNP in the last 8760 hours. HbA1C: No results for input(s): HGBA1C in the last 72 hours. CBG: Recent Labs  Lab 06/01/20 1013 06/01/20 2108  GLUCAP 312* 194*   Lipid Profile: No results for input(s): CHOL, HDL, LDLCALC, TRIG, CHOLHDL, LDLDIRECT in the last 72 hours. Thyroid Function Tests: No results for input(s): TSH, T4TOTAL, FREET4, T3FREE, THYROIDAB in the last 72 hours. Anemia Panel: No results for input(s): VITAMINB12, FOLATE, FERRITIN, TIBC, IRON, RETICCTPCT in the last 72 hours. Urine analysis:    Component Value Date/Time   COLORURINE AMBER (A) 05/04/2020 1258   APPEARANCEUR CLOUDY (A) 05/04/2020 1258   LABSPEC 1.019  05/04/2020 1258   PHURINE 5.0 05/04/2020  1258   GLUCOSEU 50 (A) 05/04/2020 1258   HGBUR SMALL (A) 05/04/2020 1258   BILIRUBINUR NEGATIVE 05/04/2020 1258   KETONESUR NEGATIVE 05/04/2020 1258   PROTEINUR 100 (A) 05/04/2020 1258   UROBILINOGEN 1.0 11/19/2013 1855   NITRITE NEGATIVE 05/04/2020 1258   LEUKOCYTESUR NEGATIVE 05/04/2020 1258   Sepsis Labs: @LABRCNTIP (procalcitonin:4,lacticidven:4) ) Recent Results (from the past 240 hour(s))  SARS Coronavirus 2 by RT PCR (hospital order, performed in [facility] hospital lab) Nasopharyngeal Nasopharyngeal Swab     Status: None   Collection Time: 06/01/20  9:10 PM   Specimen: Nasopharyngeal Swab  Result Value Ref Range Status   SARS Coronavirus 2 NEGATIVE NEGATIVE Final    Comment: (NOTE) SARS-CoV-2 target nucleic acids are NOT DETECTED.  The SARS-CoV-2 RNA is generally detectable in upper and lower respiratory specimens during the acute phase of infection. The lowest concentration of SARS-CoV-2 viral copies this assay can detect is 250 copies / mL. A negative result does not preclude SARS-CoV-2 infection and should not be used as the sole basis for treatment or other patient management decisions.  A negative result may occur with improper specimen collection / handling, submission of specimen other than nasopharyngeal swab, presence of viral mutation(s) within the areas targeted by this assay, and inadequate number of viral copies (<250 copies / mL). A negative result must be combined with clinical observations, patient history, and epidemiological information.  Fact Sheet for Patients:   06/03/20  Fact Sheet for Healthcare Providers: BoilerBrush.com.cy  This test is not yet approved or  cleared by the https://pope.com/ FDA and has been authorized for detection and/or diagnosis of SARS-CoV-2 by FDA under an Emergency Use Authorization (EUA).  This EUA will remain in effect  (meaning this test can be used) for the duration of the COVID-19 declaration under Section 564(b)(1) of the Act, 21 U.S.C. section 360bbb-3(b)(1), unless the authorization is terminated or revoked sooner.  Performed at [facility], [address], [place], [place]      Radiological Exams on Admission: CT HEAD WO CONTRAST  Result Date: 06/01/2020 CLINICAL DATA:  Acute neuro deficit.  Slurred speech and confusion. EXAM: CT HEAD WITHOUT CONTRAST TECHNIQUE: Contiguous axial images were obtained from the base of the skull through the vertex without intravenous contrast. COMPARISON:  None. FINDINGS: Brain: Generalized atrophy, most prominent around the sylvian fissure bilaterally. Negative for hydrocephalus. Bilateral white matter hypodensities compatible with microvascular ischemia. Focal hypodensities in the corona radiata bilaterally could be subacute or chronic infarct. Negative for hemorrhage or mass.  No midline shift. Vascular: Atherosclerotic calcification carotid and vertebral arteries. Negative for hyperdense vessel Skull: Negative Sinuses/Orbits: Mild mucosal edema paranasal sinuses. Negative orbit Other: None IMPRESSION: Generalized atrophy, greater than expected for age Bilateral white matter disease most compatible with microvascular ischemia also more prominent than expected for age. Focal hypodensities in the corona radiata bilaterally could represent subacute or chronic ischemia. No intracranial hemorrhage. Electronically Signed   By: 06/03/2020 [name]   On: 06/01/2020 11:14   MR ANGIO HEAD WO CONTRAST  Result Date: 06/01/2020 CLINICAL DATA:  Neuro deficit, acute, stroke suspected EXAM: MRA HEAD WITHOUT CONTRAST TECHNIQUE: Angiographic images of the Circle of Willis were obtained using MRA technique without intravenous contrast. COMPARISON:  None. FINDINGS: POSTERIOR CIRCULATION: --Vertebral arteries: Normal V4 segments. --Inferior cerebellar arteries: Normal. --Basilar  artery: Normal. --Superior cerebellar arteries: Normal. --Posterior cerebral arteries: Normal. ANTERIOR CIRCULATION: --Intracranial internal carotid arteries: Normal. --Anterior cerebral arteries (ACA): Normal. Both A1 segments are  present. Patent anterior communicating artery (a-comm). --Middle cerebral arteries (MCA): Normal. IMPRESSION: Normal intracranial MRA. Electronically Signed   By: Deatra Robinson [name]   On: 06/01/2020 23:35   MR ANGIO NECK WO CONTRAST  Result Date: 06/01/2020 CLINICAL DATA:  Neuro deficit, acute, stroke suspected EXAM: MRA NECK WITHOUT CONTRAST TECHNIQUE: Angiographic images of the neck were obtained using MRA technique without intravenous contrast. COMPARISON:  None. FINDINGS: Time-of-flight images of the carotid and vertebral arteries are normal. IMPRESSION: Normal MRA of the neck. Electronically Signed   By: Deatra Robinson [name]   On: 06/01/2020 23:41   MR Brain Wo Contrast (neuro protocol)  Result Date: 06/01/2020 CLINICAL DATA:  Slurred speech EXAM: MRI HEAD WITHOUT CONTRAST TECHNIQUE: Multiplanar, multiecho pulse sequences of the brain and surrounding structures were obtained without intravenous contrast. COMPARISON:  None. 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 Early confluent hyperintense T2-weighted signal of the periventricular and deep white matter, most commonly due to chronic ischemic microangiopathy. There is an old right corona radiata small vessel infarct. Advanced atrophy for age. No chronic microhemorrhage. Normal midline structures. Vascular: Normal flow voids. Skull and upper cervical spine: Normal marrow signal. Sinuses/Orbits: Negative. Other: None. IMPRESSION: 1. 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 2. Advanced atrophy and chronic ischemic microangiopathy. Electronically Signed   By: Deatra Robinson [name]   On: 06/01/2020 19:40   MR Cervical Spine Wo Contrast  Result Date: 06/01/2020 CLINICAL DATA:  Myelopathy, acute or  progressive EXAM: MRI CERVICAL SPINE WITHOUT CONTRAST TECHNIQUE: Multiplanar, multisequence MR imaging of the cervical spine was performed. No intravenous contrast was administered. COMPARISON:  None. FINDINGS: Alignment: Physiologic. Vertebrae: No fracture, evidence of discitis, or bone lesion. Cord: Normal signal and morphology. Posterior Fossa, vertebral arteries, paraspinal tissues: Negative. Disc levels: There is no disc herniation, spinal canal stenosis or neural impingement. IMPRESSION: Normal MRI of the cervical spine. Electronically Signed   By: Deatra Robinson [name]   On: 06/01/2020 23:39    EKG: Independently reviewed.  Shows normal sinus rhythm with left axis deviation.  Moderate voltage criteria for LVH and nonspecific ST changes.  Assessment/Plan Principal Problem:   Acute cerebrovascular accident (CVA) (HCC) Active Problems:   Dyslipidemia   Cocaine abuse (HCC)   Chronic diastolic heart failure (HCC)   Paroxysmal atrial fibrillation (HCC)   Diabetes mellitus (HCC)   Tobacco user   Cerebral embolism with cerebral infarction     #1 acute CVA: Small lesion.  Not consistent with some of the patient's symptoms.  He will be admitted.  Maintain on aspirin and statin.  Neurology consultation.  Monitor closely and adjust medications per neurology.  PT OT and speech evaluation.  #2 diabetes: Blood sugar is elevated.  Continue long-acting insulin from home and sliding scale insulin.  Adjust dose as needed.  #3 hypertension: Tight blood pressure control.  #4 hyperlipidemia: Continue with statin.  #5 paroxysmal atrial fibrillation: On anticoagulation with Xarelto.  Continue  #6 history of polysubstance abuse: Patient reported not using any longer.  Urine drug screen is negative.  #7 morbid obesity: Counseling provided.  Continue monitoring.  #8 tobacco abuse: Nicotine patch with tobacco cessation counseling    DVT prophylaxis: Xarelto after 2 to 3 days.  Continue SCD Code Status:  Full code Family Communication: No family at bedside Disposition Plan: Home Consults called: Neurology Dr. Derry Lory Admission status: Inpatient  Severity of Illness: The appropriate patient status for this patient is INPATIENT. Inpatient status is judged to be reasonable  and necessary in order to provide the required intensity of service to ensure the patient's safety. The patient's presenting symptoms, physical exam findings, and initial radiographic and laboratory data in the context of their chronic comorbidities is felt to place them at high risk for further clinical deterioration. Furthermore, it is not anticipated that the patient will be medically stable for discharge from the hospital within 2 midnights of admission. The following factors support the patient status of inpatient.   " The patient's presenting symptoms include dysarthria and some nonspecific weakness. " The worrisome physical exam findings include speech changes. " The initial radiographic and laboratory data are worrisome because of acute CVA on MRI. " The chronic co-morbidities include diabetes with hypertension.   * I certify that at the point of admission it is my clinical judgment that the patient will require inpatient hospital care spanning beyond 2 midnights from the point of admission due to high intensity of service, high risk for further deterioration and high frequency of surveillance required.Lonia Blood MD [facility] Pager [phone]  If 7PM-7AM, please contact night-coverage [email or web address] Password [facility]  06/01/2020, 11:56 PM

## 2020-06-01 NOTE — ED Triage Notes (Signed)
Pt states yesterday he started having slurred speech and cannot get words out.  Reports he gets confused and cant do simple stuff. Pt appears to have slight left facial droop, no extremity drifts or weakness.  Pt states he is diabetic and has not checked his sugar lately.

## 2020-06-01 NOTE — Consult Note (Addendum)
NEUROLOGY CONSULTATION NOTE   Date of service: June 01, 2020 Patient Name: [name] MRN:  [id number] DOB:  06/13/1971 Reason for consult: "L acute caudate stroke on MRI"  History of Present Illness  [name] is a 49 y.o. 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  He works from 10 AM in the morning to noon.  Around noon, he went to a fast food restaurant to order some food but he had trouble with getting his words out and his words were slurring.  He thought he could sleep this off so he did not come to the hospital.  Upon awakening in the morning the next day, he still continued to have problems with speech and slurring of his speech.  He eventually came to the ED for further work-up.  He also endorses some issues with depth perception including difficulty with driving.  He drives a truck and reports that he fortunately did not hurt anyone but would either turn his truck very early or very late.  He endorses smoking, he does not monitor his glucose at home or his blood pressure.  He he endorses skipping his medication on average about 3 days a week.  He took a Xarelto last night but skipped for 3 days straight prior to that.  He denies any personal history of stroke.  He denies any family history of stroke.  During exam, he was noted to have weak left hand grip and left wrist extension and weak left finger abduction along with some mild weakness with his left hip flexion.  On further questioning, he is not sure how long this has been going on for.  He typically uses his dominant hand to do most of his work.  He denies any saddle anesthesia, denies any bowel incontinence, no constipation or diarrhea.  He endorses urinary urgency and does endorse some difficulty with completely emptying his bladder.  He  also endorses positive Lhermitte sign with shock sensation running down either from his neck down or from his mid back down. He does manual labor jobs and does endorse frequently lifting heavy objects.  He had work-up in the ED with an MRI brain which demonstrated an acute left caudate infarct.  LKW: 10:00 on 05/31/20. NIHSS: 2 for mild aphasia and mild dysarthria. Baseline mRS: 0 TPA: no, outside the window Thrombectomy: no, outside the window.    ROS   Constitutional Denies weight loss, fever and chills.  HEENT Denies changes in vision and hearing.  Respiratory Denies SOB and cough.  CV Denies palpitations and CP  GI Denies abdominal pain, nausea, vomiting and diarrhea.  GU Denies dysuria but endorses urinary frequency.  MSK Denies myalgia and joint pain.  Skin Denies rash and pruritus.  Neurological Denies headache and syncope.  Psychiatric Denies recent changes in mood. Denies anxiety and depression.   Past History   Past Medical History:  Diagnosis Date   Diabetes mellitus    GERD (gastroesophageal reflux disease)    History of alcohol abuse    History of cocaine use    History of echocardiogram    a. Echo 4/14: Moderate LVH, vigorous LVEF, EF 65-70%, normal wall motion, grade 2 diastolic dysfunction, mildly dilated aortic root and ascending aorta, ascending aorta 40 mm, aortic root 38 mm, mild LAE   Hx of cardiovascular  stress test    a. GXT 5/14: No ischemic changes  //  b. ETT-Myoview 3/16:  Low risk, no ischemia, EF 58%   Hyperlipidemia    Hypertension    Morbid obesity (HCC)    Paroxysmal atrial fibrillation (HCC)    Occurring in 2008, with several recurrence since then (including in the setting of + cocaine on UDS).   Sleep apnea    uses CPAP   SVT (supraventricular tachycardia) (HCC)    mid to long RP SVT 09/2019   Past Surgical History:  Procedure Laterality Date   APPENDECTOMY     ATRIAL FIBRILLATION ABLATION N/A 11/27/2019   Procedure:  ATRIAL FIBRILLATION ABLATION;  Surgeon: Hillis RangeAllred, James, MD;  Location: [facility];  Service: Cardiovascular;  Laterality: N/A;   ROTATOR CUFF REPAIR     SVT ABLATION N/A 11/27/2019   Procedure: SVT ABLATION;  Surgeon: Hillis RangeAllred, James, MD;  Location: [facility];  Service: Cardiovascular;  Laterality: N/A;   TONSILLECTOMY     Family History  Problem Relation Age of Onset   Diabetes Mother    Heart attack Father [id number]   Stroke Maternal Grandmother    Stroke Paternal Grandmother    Diabetes Paternal Grandfather    Social History   Socioeconomic History   Marital status: Single    Spouse name: Not on file   Number of children: 0   Years of education: 12   Highest education level: Not on file  Occupational History   Occupation: Unemployed    Employer: UNEMPLOYED  Tobacco Use   Smoking status: Current Every Day Smoker    Packs/day: 0.50    Years: 28.00    Pack years: 14.00    Types: Cigarettes   Smokeless tobacco: Former NeurosurgeonUser    Types: Snuff   Tobacco comment: smoking off and on, less than a pack per day.  Vaping Use   Vaping Use: Never used  Substance and Sexual Activity   Alcohol use: Not Currently    Alcohol/week: 6.0 standard drinks    Types: 6 Standard drinks or equivalent per week    Comment: monthly   Drug use: Not Currently    Comment: cocaine in the past, none currently   Sexual activity: Not on file  Other Topics Concern   Not on file  Social History Narrative   Fun: PhotographerDrag racing       [place].      Unemployed, was working a Office managersecurity job   Social Determinants of Corporate investment bankerHealth   Financial Resource Strain:    Difficulty of Paying Living Expenses:   Food Insecurity:    Worried About Programme researcher, broadcasting/film/videounning Out of Food in the Last Year:    Baristaan Out of Food in the Last Year:   Transportation Needs:    Freight forwarderLack of Transportation (Medical):    Lack of Transportation (Non-Medical):   Physical Activity:    Days of Exercise per  Week:    Minutes of Exercise per Session:   Stress:    Feeling of Stress :   Social Connections:    Frequency of Communication with Friends and Family:    Frequency of Social Gatherings with Friends and Family:    Attends Religious Services:    Active Member of Clubs or Organizations:    Attends BankerClub or Organization Meetings:    Marital Status:    Allergies  Allergen Reactions   Shrimp [Shellfish Allergy] Anaphylaxis   Banana Other (See Comments)    Pt  gags   Watermelon Flavor Nausea And Vomiting   Peanut-Containing Drug Products Itching and Cough   Almond Oil Itching    Roof of mouth itches   Sulfa Antibiotics Itching    Medications  (Not in a hospital admission)    Vitals  Temp:  [97.2 F (36.2 C)-98.8 F (37.1 C)] 98.2 F (36.8 C) (08/17 1456) Pulse Rate:  [68-75] 72 (08/17 1619) Resp:  [15-20] 18 (08/17 1619) BP: (105-146)/(73-106) 124/84 (08/17 1619) SpO2:  [95 %-100 %] 100 % (08/17 1619) Weight:  [122.5 kg] 122.5 kg (08/17 1007)  Body mass index is 39.87 kg/m.  Physical Exam   General: Laying comfortably in bed; in no acute distress.  HENT: Normal oropharynx and mucosa. Normal external appearance of ears and nose. Neck: Supple, no pain or tenderness CV: No JVD. No peripheral edema. Pulmonary: Symmetric Chest rise. Normal respiratory effort. Abdomen: Soft to touch, non-tender Ext: No cyanosis, edema, or deformity  Skin: Rash on BL shins, attributes that to scrapes he gets when he kneels down for work. Normal palpation of skin.   Musculoskeletal: Normal digits and nails by inspection. No clubbing.  Neurologic Examination  Mental status/Cognition: Alert, oriented to self, place, month and year, good attention. Speech/language: Fluent with some mild aphasia with mild slurring of his speech, comprehension intact, object naming intact, repetition intact. Cranial nerves:   CN II Pupils equal and reactive to light, no VF deficits   CN III,IV,VI  EOM intact, no gaze preference or deviation, no nystagmus   CN V normal sensation in V1, V2, and V3 segments bilaterally   CN VII no asymmetry, no nasolabial fold flattening   CN VIII normal hearing to speech   CN IX & X normal palatal elevation, no uvular deviation   CN XI 5/5 head turn and 5/5 shoulder shrug bilaterally   CN XII midline tongue protrusion   Motor:  Muscle bulk: normal, tone normal, pronator drift none Mvmt Root Nerve  Muscle Right Left Comments  SA C5/6 Ax Deltoid 5 5   EF C5/6 Mc Biceps 5 5   EE C6/7/8 Rad Triceps 5 5   WF C6/7 Med FCR 5 5   WE C7/8 PIN ECU 5 4+   F Ab C8/T1 U ADM/FDI 5 4+   HF L1/2/3 Fem Illopsoas 5 4+   KE L2/3/4 Fem Quad 5 5   DF L4/5 D Peron Tib Ant 5 5   PF S1/2 Tibial Grc/Sol 5 5    Reflexes:  Right Left Comments  Pectoralis      Biceps (C5/6) 2 2   Brachioradialis (C5/6) 2 2    Triceps (C6/7) 2 2    Patellar (L3/4) 3 3    Achilles (S1) 0 0    Hoffman      Plantar     Jaw jerk    Sensation:  Light touch  intact in face and all extremities   Pin prick  intact in face and all extremities   Temperature    Vibration   Proprioception    Coordination/Complex Motor:  - Finger to Nose with some past pointing - Heel to shin intact BL - Rapid alternating movement slowed - Gait: deferred  Labs   Lab Results  Component Value Date   NA 137 06/01/2020   K 5.4 (H) 06/01/2020   CL 103 06/01/2020   CO2 23 06/01/2020   GLUCOSE 337 (H) 06/01/2020   BUN 35 (H) 06/01/2020   CREATININE 1.30 (H) 06/01/2020   CALCIUM  8.9 06/01/2020   ALBUMIN 3.2 (L) 06/01/2020   AST 25 06/01/2020   ALT 22 06/01/2020   ALKPHOS 121 06/01/2020   BILITOT 1.0 06/01/2020   GFRNONAA 59 (L) 06/01/2020   GFRAA >60 06/01/2020     Imaging and Diagnostic studies  MRI brain without contrast: IMPRESSION: 1. 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 2. Advanced atrophy and chronic ischemic microangiopathy.   Impression   [name] is a  49 y.o. male with PMH significant for smoking, morbid obesity, OSA, DM2, HTN, substance use, pAfibb and dCHF who presents with abnormal depth perception, mild aphasia, mild dysarthria with a last known well of 10 AM on 05/31/2020 with a NIH stroke scale of 2 and a baseline MRS of 0.  In addition, he also has mild left handgrip weakness and left hip flexion weakness of unknown duration and endorses urinary frequency with incomplete emptying of bladder, positive Lhermitte sign.  The noted left caudate stroke on the MRI is unlikely to explain all of his symptoms specifically the left hand grip and left hip flexion mild weakness.  We will get MRI C-spine for further evaluation.  As for the abnormal depth perception, no MRI Brain findings to explain this at this time. We will get optho to weigh in on this and assess for occular causes.  Recommendations  - Plan: - Neuro checks Q4H or per protocol. - Brain imaging- MRI Brain with a left caudate infarct. - Vascular imaging- I ordered MR Angio Brain/Neck - TTE - Lipid panel - Statin - Continue Atorvastatin 10mg  daily. - A1C - Antithrombotic - Start Aspirin 81mg  daily, hold off on Xarelto for 2-3 days in the setting of acute stroke. Time to resume per stroke team during morning rounds. - DVT ppx - SQ lovenox. - SBP goal - permissive hypertension first 24 h < 220/110. - Telemetry monitoring for arrythmia - Swallow screen - please keep him NPO until he passes swallow eval. - Stroke education - PT/OT/SLP consults - MRI Cervical spine without contrast - ordered by ED team. - Recommend optho eval to assess for ophthalmologic causes of impaired depth perception.  ______________________________________________________________________   Thank you for the opportunity to take part in the care of this patient. If you have any further questions, please contact the neurology consultation attending.  Signed,  [facility] Pager  Number 

## 2020-06-01 NOTE — ED Provider Notes (Signed)
[facility] EMERGENCY DEPARTMENT Provider Note   CSN: [id number] Arrival date & time: 06/01/20  4098     History Chief Complaint  Patient presents with  . Stroke Symptoms    [name] is a 49 y.o. male with history of diabetes mellitus, GERD, polysubstance abuse, hypertension, hyperlipidemia, paroxysmal A. fib, morbid obesity, OSA presents for evaluation of acute onset, persistent dysarthric speech noted around 10 AM yesterday.  He states that he was weed eating a lawn when he began to feel very fatigued and so he sat down.  He then went to [facility] to get a meal and noted that the cashier at the register had difficulty understanding him although he felt that he was making sense.  Since then he states that multiple people have told him that he has sounded like he has had slurred speech.  He has also noted issues with depth perception.  For example, he states that he attempted to place a pen on a tabletop but misjudged the distance and the pen fell to the ground.  He also states that while driving, he has misjudged the distance for his turns and will either turn to earlier too late.  Denies vision changes, numbness of the extremities, chest pain, shortness of breath, abdominal pain, nausea, vomiting, or headaches.  No fevers.  No known sick contacts.  He is a current smoker.  He denies recreational drug use or alcohol use.  States that his baseline sugars are in the 200s.  The history is provided by the patient.       Past Medical History:  Diagnosis Date  . Diabetes mellitus   . GERD (gastroesophageal reflux disease)   . History of alcohol abuse   . History of cocaine use   . History of echocardiogram    a. Echo 4/14: Moderate LVH, vigorous LVEF, EF 65-70%, normal wall motion, grade 2 diastolic dysfunction, mildly dilated aortic root and ascending aorta, ascending aorta 40 mm, aortic root 38 mm, mild LAE  . Hx of cardiovascular stress test    a. GXT 5/14: No  ischemic changes  //  b. ETT-Myoview 3/16:  Low risk, no ischemia, EF 58%  . Hyperlipidemia   . Hypertension   . Morbid obesity (HCC)   . Paroxysmal atrial fibrillation (HCC)    Occurring in 2008, with several recurrence since then (including in the setting of + cocaine on UDS).  . Sleep apnea    uses CPAP  . SVT (supraventricular tachycardia) (HCC)    mid to long RP SVT 09/2019    Patient Active Problem List   Diagnosis Date Noted  . 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 Acute cerebrovascular accident (CVA) (HCC) 06/01/2020  . Paronychia of right thumb 05/10/2020  . Mediastinal adenopathy 01/06/2020  . Secondary hypercoagulable state (HCC) 10/22/2019  . Palpitations 09/30/2019  . Acute respiratory failure with hypoxia (HCC) 09/30/2019  . SVT (supraventricular tachycardia) (HCC) 07/08/2019  . Acute pancreatitis 01/22/2019  . Class 3 obesity due to excess calories with serious comorbidity and body mass index (BMI) of 40.0 to 44.9 in adult 12/08/2016  . Paroxysmal atrial fibrillation (HCC) 01/23/2013  . Diabetes mellitus (HCC) 01/23/2013  . Hypertensive heart disease 01/23/2013  . Tobacco user 01/23/2013  . Chronic diastolic heart failure (HCC) 01/21/2013  . Community acquired pneumonia 01/20/2013  . 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 Shoulder pain, right 02/15/2011  . Seasonal and perennial allergic rhinitis 02/13/2008  . Dyslipidemia 02/12/2008  . OBESITY,  MORBID 02/12/2008  . COCAINE Abuse, past hx 02/12/2008  . Obstructive sleep apnea 02/12/2008    Past Surgical History:  Procedure Laterality Date  . APPENDECTOMY    . ATRIAL FIBRILLATION ABLATION N/A 11/27/2019   Procedure: ATRIAL FIBRILLATION ABLATION;  Surgeon: [name], MD;  Location: [facility] INVASIVE CV LAB;  Service: Cardiovascular;  Laterality: N/A;  . ROTATOR CUFF REPAIR    . SVT ABLATION N/A 11/27/2019   Procedure: SVT ABLATION;  Surgeon: [name], MD;  Location: [facility] INVASIVE CV LAB;   Service: Cardiovascular;  Laterality: N/A;  . TONSILLECTOMY         Family History  Problem Relation Age of Onset  . Diabetes Mother   . Heart attack Father 83  . Stroke Maternal Grandmother   . Stroke Paternal Grandmother   . Diabetes Paternal Grandfather     Social History   Tobacco Use  . Smoking status: Current Every Day Smoker    Packs/day: 0.50    Years: 28.00    Pack years: 14.00    Types: Cigarettes  . Smokeless tobacco: Former Neurosurgeon    Types: Snuff  . Tobacco comment: smoking off and on, less than a pack per day.  Vaping Use  . Vaping Use: Never used  Substance Use Topics  . Alcohol use: Not Currently    Alcohol/week: 6.0 standard drinks    Types: 6 Standard drinks or equivalent per week    Comment: monthly  . Drug use: Not Currently    Comment: cocaine in the past, none currently    Home Medications Prior to Admission medications   Medication Sig Start Date End Date Taking? Authorizing Provider  amphetamine-dextroamphetamine (ADDERALL XR) 30 MG 24 hr capsule Take 1 capsule (30 mg total) by mouth daily as needed (focus). 1 daily as needed Patient taking differently: Take 30 mg by mouth daily as needed (focus).  05/17/20  Yes Young, Joni Fears D, MD  atorvastatin (LIPITOR) 10 MG tablet Take 10 mg by mouth daily. 09/16/19  Yes [provider]  Blood Pressure Monitoring (BLOOD PRESSURE MONITOR/L CUFF) MISC 1 Units by Does not apply route daily. 03/19/19  Yes Jeannie Fend, PA-C  diltiazem (CARDIZEM) 30 MG tablet Take 1 Tablet Every 4 Hours As Needed For HR >100 Patient taking differently: Take 30 mg by mouth every 4 (four) hours as needed. For HR >100 10/30/19  Yes Fenton, Clint R, PA  flecainide (TAMBOCOR) 100 MG tablet Take 1 tablet (100 mg total) by mouth 2 (two) times daily. 03/29/20  Yes Allred, Fayrene Fearing, MD  glipiZIDE (GLUCOTROL) 10 MG tablet Take 10 mg by mouth 2 (two) times daily. 01/06/19  Yes [provider]  lisinopril (ZESTRIL) 40 MG tablet Take  40 mg by mouth daily. 10/14/19  Yes [provider]  metFORMIN (GLUCOPHAGE) 500 MG tablet Take 1,000 mg by mouth 2 (two) times daily with a meal.  01/06/19  Yes [provider]  metoprolol succinate (TOPROL-XL) 50 MG 24 hr tablet Take 1 tablet (50 mg total) by mouth daily. Take with or immediately following a meal. 03/29/20 06/27/20 Yes Allred, Fayrene Fearing, MD  pantoprazole (PROTONIX) 40 MG tablet TAKE 1 TABLET BY MOUTH EVERY DAY Patient taking differently: Take 40 mg by mouth daily.  12/22/19  Yes Allred, Fayrene Fearing, MD  XARELTO 20 MG TABS tablet TAKE 1 TABLET (20 MG TOTAL) BY MOUTH DAILY WITH SUPPER. Patient taking differently: Take 20 mg by mouth daily with supper.  04/05/20  Yes Allred, Fayrene Fearing, MD  doxycycline (VIBRAMYCIN)  100 MG capsule Take 1 capsule (100 mg total) by mouth 2 (two) times daily. One po bid x 7 days Patient not taking: Reported on 06/01/2020 05/01/20   Dartha Lodge, PA-C  ondansetron (ZOFRAN ODT) 8 MG disintegrating tablet Take 1 tablet (8 mg total) by mouth every 8 (eight) hours as needed for nausea or vomiting. Patient not taking: Reported on 06/01/2020 05/04/20   Linwood Dibbles, MD    Allergies    Shrimp [shellfish allergy], Banana, Watermelon flavor, Peanut-containing drug products, Almond oil, and Sulfa antibiotics  Review of Systems   Review of Systems  Constitutional: Negative for chills and fever.  Eyes: Negative for photophobia and visual disturbance.  Respiratory: Negative for shortness of breath.   Cardiovascular: Negative for chest pain.  Gastrointestinal: Negative for abdominal pain, nausea and vomiting.  Neurological: Positive for speech difficulty. Negative for weakness, light-headedness, numbness and headaches.  All other systems reviewed and are negative.   Physical Exam Updated Vital Signs BP (!) 184/85 (BP Location: Right Arm)   Pulse 74   Temp 98.2 F (36.8 C) (Oral)   Resp 18   Ht 5\' 9"  (1.753 m)   Wt 122.5 kg   SpO2 97%   BMI 39.87 kg/m    Physical Exam Vitals and nursing note reviewed.  Constitutional:      General: He is not in acute distress.    Appearance: He is well-developed. He is obese.  HENT:     Head: Normocephalic and atraumatic.  Eyes:     General:        Right eye: No discharge.        Left eye: No discharge.     Conjunctiva/sclera: Conjunctivae normal.  Neck:     Vascular: No JVD.     Trachea: No tracheal deviation.  Cardiovascular:     Rate and Rhythm: Normal rate and regular rhythm.  Pulmonary:     Effort: Pulmonary effort is normal.     Breath sounds: Normal breath sounds.  Abdominal:     General: Bowel sounds are normal. There is no distension.     Palpations: Abdomen is soft.     Tenderness: There is no abdominal tenderness. There is no guarding or rebound.  Musculoskeletal:     Cervical back: Neck supple. No rigidity.  Skin:    Findings: No erythema.  Neurological:     Mental Status: He is alert.     Comments: Mental Status:  Alert, thought content appropriate, able to give a coherent history.Able to follow 2 step commands without difficulty. Speech is mildly dysarthric. Cranial Nerves:  II:  Peripheral visual fields grossly normal, pupils equal, round, reactive to light III,IV, VI: ptosis not present, extra-ocular motions intact bilaterally  V,VII: smile symmetric, facial light touch sensation equal VIII: hearing grossly normal to voice  X: uvula elevates symmetrically  XI: bilateral shoulder shrug symmetric and strong XII: midline tongue extension without fassiculations Motor:  Normal tone. 5/5 strength of BUE and BLE major muscle groups including strong and equal grip strength and dorsiflexion/plantar flexion, no pronator drift Sensory: light touch normal in all extremities. Cerebellar: normal finger-to-nose with bilateral upper extremities, Romberg sign absent Gait: normal gait and balance. Able to walk on toes and heels with ease.    Psychiatric:        Behavior: Behavior  normal.     ED Results / Procedures / Treatments   Labs (all labs ordered are listed, but only abnormal results are displayed) Labs Reviewed  PROTIME-INR -  Abnormal; Notable for the following components:      Result Value   Prothrombin Time 21.3 (*)    INR 1.9 (*)    All other components within normal limits  APTT - Abnormal; Notable for the following components:   aPTT 42 (*)    All other components within normal limits  DIFFERENTIAL - Abnormal; Notable for the following components:   Eosinophils Absolute 0.7 (*)    All other components within normal limits  COMPREHENSIVE METABOLIC PANEL - Abnormal; Notable for the following components:   Sodium 133 (*)    Potassium 5.7 (*)    Glucose, Bld 335 (*)    BUN 26 (*)    Creatinine, Ser 1.40 (*)    Albumin 3.2 (*)    GFR calc non Af Amer 59 (*)    All other components within normal limits  I-STAT CHEM 8, ED - Abnormal; Notable for the following components:   Potassium 5.4 (*)    BUN 35 (*)    Creatinine, Ser 1.30 (*)    Glucose, Bld 337 (*)    All other components within normal limits  CBG MONITORING, ED - Abnormal; Notable for the following components:   Glucose-Capillary 312 (*)    All other components within normal limits  CBG MONITORING, ED - Abnormal; Notable for the following components:   Glucose-Capillary 194 (*)    All other components within normal limits  SARS CORONAVIRUS 2 BY RT PCR (HOSPITAL ORDER, PERFORMED IN  HOSPITAL LAB)  CBC  RAPID URINE DRUG SCREEN, HOSP PERFORMED    EKG EKG Interpretation  Date/Time:  Tuesday June 01 2020 10:03:57 EDT Ventricular Rate:  75 PR Interval:  172 QRS Duration: 90 QT Interval:  390 QTC Calculation: 435 R Axis:   -44 Text Interpretation: Normal sinus rhythm Left axis deviation Moderate voltage criteria for LVH, may be normal variant ( R in aVL , Cornell product ) Septal infarct , age undetermined T wave abnormality, consider lateral ischemia Abnormal ECG  Confirmed by Kristine RoyalMessick, Peter [phone]([id number]) on 06/01/2020 5:06:47 PM   Radiology CT HEAD WO CONTRAST  Result Date: 06/01/2020 CLINICAL DATA:  Acute neuro deficit.  Slurred speech and confusion. EXAM: CT HEAD WITHOUT CONTRAST TECHNIQUE: Contiguous axial images were obtained from the base of the skull through the vertex without intravenous contrast. COMPARISON:  None. FINDINGS: Brain: Generalized atrophy, most prominent around the sylvian fissure bilaterally. Negative for hydrocephalus. Bilateral white matter hypodensities compatible with microvascular ischemia. Focal hypodensities in the corona radiata bilaterally could be subacute or chronic infarct. Negative for hemorrhage or mass.  No midline shift. Vascular: Atherosclerotic calcification carotid and vertebral arteries. Negative for hyperdense vessel Skull: Negative Sinuses/Orbits: Mild mucosal edema paranasal sinuses. Negative orbit Other: None IMPRESSION: Generalized atrophy, greater than expected for age Bilateral white matter disease most compatible with microvascular ischemia also more prominent than expected for age. Focal hypodensities in the corona radiata bilaterally could represent subacute or chronic ischemia. No intracranial hemorrhage. Electronically Signed   By: Marlan Palauharles  Clark [name]   On: 06/01/2020 11:14   MR Brain Wo Contrast (neuro protocol)  Result Date: 06/01/2020 CLINICAL DATA:  Slurred speech EXAM: MRI HEAD WITHOUT CONTRAST TECHNIQUE: Multiplanar, multiecho pulse sequences of the brain and surrounding structures were obtained without intravenous contrast. COMPARISON:  None. 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 Early confluent hyperintense T2-weighted signal of the periventricular and deep white matter, most commonly due to chronic ischemic microangiopathy. There is an old right corona radiata  small vessel infarct. Advanced atrophy for age. No chronic microhemorrhage. Normal midline structures. Vascular: Normal flow  voids. Skull and upper cervical spine: Normal marrow signal. Sinuses/Orbits: Negative. Other: None. IMPRESSION: 1. 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 2. Advanced atrophy and chronic ischemic microangiopathy. Electronically Signed   By: Deatra Robinson [name]   On: 06/01/2020 19:40    Procedures Procedures (including critical care time)  Medications Ordered in ED Medications  sodium chloride flush (NS) 0.9 % injection 3 mL (3 mLs Intravenous Given 06/01/20 1803)  sodium chloride 0.9 % bolus 1,000 mL (1,000 mLs Intravenous New Bag/Given 06/01/20 1803)  aspirin chewable tablet 81 mg (81 mg Oral Given 06/01/20 2115)  LORazepam (ATIVAN) injection 1 mg (1 mg Intravenous Given 06/01/20 2211)    ED Course  I have reviewed the triage vital signs and the nursing notes.  Pertinent labs & imaging results that were available during my care of the patient were reviewed by me and considered in my medical decision making (see chart for details).    MDM Rules/Calculators/A&P                          Patient presenting for evaluation of sudden onset slurred speech and difficulty with dexterity and depth perception.  He is afebrile, intermittently hypertensive in the ED.  He is nontoxic in appearance.  He does exhibit dysarthric speech.  Lab work reviewed and interpreted by myself shows no leukocytosis, no anemia.  He is hyperglycemic but anion gap is within normal limits.  Does not appear to be in DKA at this time.  He is mildly hyperkalemic but his EKG shows normal sinus rhythm, with no peaked T waves and QT interval is within normal limits.  He was given IV fluids with improvement in his blood glucose.  Renal insufficiency appears to be around baseline.  Head CT shows generalized atrophy greater than expected for age as well as bilateral white matter disease compatible with microvascular ischemia.  With multiple comorbidities, will obtain MRI to rule out acute infarct.  MRI shows small acute infarct  of the left caudate body. CONSULT: Spoke with Dr. Derry Lory with on-call neurology service who will assess the patient emergently in the ED.  He notes week left hand grip and weakness with left wrist extension and left finger abduction along with some mild weakness of the left hip flexor.  This is not consistent with noted left caudate infarct so he recommends addition of an MRI C-spine for further evaluation.  He recommends admission to the hospital service for stroke work-up.  He recommends holding Xarelto for a few days but starting him on baby aspirin daily.  Spoke with Dr. Mikeal Hawthorne with [facility] hospitalist service who agrees to assume care of patient and bring him into the hospital for further evaluation and management.   Final Clinical Impression(s) / ED Diagnoses Final diagnoses:  Cerebrovascular accident (CVA), unspecified mechanism (HCC)  Hyperglycemia    Rx / DC Orders ED Discharge Orders    None       Bennye Alm 06/01/20 2347    Wynetta Fines, MD 06/03/20 [phone]

## 2020-06-02 ENCOUNTER — Inpatient Hospital Stay (HOSPITAL_COMMUNITY): Payer: 59

## 2020-06-02 ENCOUNTER — Encounter (HOSPITAL_COMMUNITY): Payer: 59

## 2020-06-02 DIAGNOSIS — I639 Cerebral infarction, unspecified: Principal | ICD-10-CM

## 2020-06-02 DIAGNOSIS — I6310 Cerebral infarction due to embolism of unspecified precerebral artery: Secondary | ICD-10-CM

## 2020-06-02 DIAGNOSIS — I6389 Other cerebral infarction: Secondary | ICD-10-CM

## 2020-06-02 LAB — LIPID PANEL
Cholesterol: 196 mg/dL (ref 0–200)
HDL: 26 mg/dL — ABNORMAL LOW (ref >40)
LDL Cholesterol: 103 mg/dL — ABNORMAL HIGH (ref 0–99)
Total CHOL/HDL Ratio: 7.5 RATIO
Triglycerides: 333 mg/dL — ABNORMAL HIGH (ref <150)
VLDL: 67 mg/dL — ABNORMAL HIGH (ref 0–40)

## 2020-06-02 LAB — ECHOCARDIOGRAM COMPLETE
Area-P 1/2: 2.34 cm2
Height: 69 in
S' Lateral: 2.8 cm
Weight: 4320 oz

## 2020-06-02 LAB — GLUCOSE, CAPILLARY
Glucose-Capillary: 264 mg/dL — ABNORMAL HIGH (ref 70–99)
Glucose-Capillary: 180 mg/dL — ABNORMAL HIGH (ref 70–99)

## 2020-06-02 LAB — HEMOGLOBIN A1C
Hgb A1c MFr Bld: 9.9 % — ABNORMAL HIGH (ref 4.8–5.6)
Mean Plasma Glucose: 237.43 mg/dL

## 2020-06-02 LAB — HIV ANTIBODY (ROUTINE TESTING W REFLEX): HIV Screen 4th Generation wRfx: NONREACTIVE

## 2020-06-02 MED ORDER — ATORVASTATIN CALCIUM 40 MG PO TABS: 40.0000 mg | ORAL_TABLET | Freq: Every day | ORAL | 1 refills | Status: DC

## 2020-06-02 MED ORDER — RIVAROXABAN 20 MG PO TABS
20.0000 mg | ORAL_TABLET | Freq: Every day | ORAL | Status: DC
  Administered 2020-06-02: 20 mg via ORAL
  Filled 2020-06-02: qty 1

## 2020-06-02 MED ORDER — ATORVASTATIN CALCIUM 40 MG PO TABS
40.0000 mg | ORAL_TABLET | Freq: Every day | ORAL | Status: DC
  Administered 2020-06-02: 40 mg via ORAL
  Filled 2020-06-02: qty 1

## 2020-06-02 MED ORDER — INSULIN ASPART 100 UNIT/ML ~~LOC~~ SOLN
0.0000 [IU] | Freq: Three times a day (TID) | SUBCUTANEOUS | Status: DC
  Administered 2020-06-02: 3 [IU] via SUBCUTANEOUS

## 2020-06-02 MED ORDER — INSULIN DETEMIR 100 UNIT/ML ~~LOC~~ SOLN
10.0000 [IU] | Freq: Every day | SUBCUTANEOUS | Status: DC
  Filled 2020-06-02: qty 0.1

## 2020-06-02 NOTE — Evaluation (Signed)
Occupational Therapy Evaluation Patient Details Name: [name] MRN: [id number] DOB: 1971-01-14 Today's Date: 06/02/2020    History of Present Illness Pt is a 49 y/o male with PMH of diabetes, HTN, morbid obesity, history of polysubstance abuse (cocaine and alcohol), presents to ER with dysarthric speech, some L sided weakness. MRI reveals acute L caudate infarct.    Clinical Impression   PTA patient independent and driving. Admitted for above and limited by problem list below, including decreased L sided coordination, questionable vision, and decreased cognition.  Patient currently demonstrates ability to complete transfers and mobility with min guard initially fading to supervision, ADLs with min guard to supervision.  He is oriented and following commands, question fatigue affecting cognition as patient nodding off while seated EOB during visual testing, but noted difficulty following multiple step commands, attending to tasks and problem solving.  Questionable L sided inattention vs visual deficits, initially seeing "double # of fingers on L side" then unable to locate fingers in L upper quadrant.  Patient will benefit from further OT services while admitted to optimize independence and safety with ADLs, but anticipate no further OT services after dc home.  Further vision and cognition assessment recommended.     Follow Up Recommendations  No OT follow up;Supervision/Assistance - 24 hour (inital)    Equipment Recommendations  3 in 1 bedside commode    Recommendations for Other Services       Precautions / Restrictions Precautions Precautions: None Restrictions Weight Bearing Restrictions: No      Mobility Bed Mobility Overal bed mobility: Modified Independent             General bed mobility comments: (P) sitting EOB on arrival  Transfers Overall transfer level: (P) Independent Equipment used: (P) None Transfers: Sit to/from Stand Sit to Stand: Min  guard;Supervision         General transfer comment: min guard fading to supervision    Balance Overall balance assessment: (P) Needs assistance Sitting-balance support: No upper extremity supported;Feet supported Sitting balance-Leahy Scale: Good     Standing balance support: No upper extremity supported;During functional activity Standing balance-Leahy Scale: Fair                             ADL either performed or assessed with clinical judgement   ADL Overall ADL's : Needs assistance/impaired     Grooming: Min guard;Standing;Supervision/safety Grooming Details (indicate cue type and reason): min guard fading to supervision Upper Body Bathing: Set up;Sitting   Lower Body Bathing: Min guard;Sit to/from stand   Upper Body Dressing : Set up;Sitting   Lower Body Dressing: Min guard;Sit to/from stand   Toilet Transfer: Min guard;Ambulation Toilet Transfer Details (indicate cue type and reason): simulated in room          Functional mobility during ADLs: Min guard;Cueing for safety;Supervision/safety General ADL Comments: min guard fading to supervision, limited by L sided coordination, visual deficits on L side, and activity tolerance     Vision Baseline Vision/History: No visual deficits Patient Visual Report: No change from baseline Vision Assessment?: Yes Eye Alignment: Within Functional Limits Ocular Range of Motion: Within Functional Limits Alignment/Gaze Preference: Within Defined Limits Tracking/Visual Pursuits: Impaired - to be further tested in functional context (cueing required to track to L side ) Visual Fields: Other (comment) (deficits noted on L side, see comments below ) Additional Comments: patient engaged in visual testing at EOB, initally with tracking able to track to  R side and then once transitioned past midline to L patient appears to be nodding off then with cueing able to track accurately; with visual field testing noted initally  appearing to see "double" on L side, then re-assessed and unable to see upper quadrant reporting "you are supposed to see things up there?"      Perception     Praxis      Pertinent Vitals/Pain Pain Assessment: (P) No/denies pain     Hand Dominance (P) Right   Extremity/Trunk Assessment Upper Extremity Assessment Upper Extremity Assessment: (P) Defer to OT evaluation LUE Deficits / Details: grossly 4+/5 MMT, mild decreased coordinatino  LUE Sensation: WNL LUE Coordination: decreased fine motor;decreased gross motor   Lower Extremity Assessment Lower Extremity Assessment: (P) Overall WFL for tasks assessed   Cervical / Trunk Assessment Cervical / Trunk Assessment: Other exceptions Cervical / Trunk Exceptions: increased body habitus    Communication Communication Communication: (P) Expressive difficulties   Cognition Arousal/Alertness: (P) Awake/alert Behavior During Therapy: (P) Flat affect Overall Cognitive Status: (P)  (NT formally) Area of Impairment: Attention;Following commands;Problem solving;Awareness                   Current Attention Level: Sustained   Following Commands: Follows one step commands consistently;Follows one step commands with increased time;Follows multi-step commands inconsistently   Awareness: Emergent Problem Solving: Slow processing;Difficulty sequencing;Requires verbal cues General Comments: patient asleep upon entry but awakens easily, oriented and follows commands; difficulty attending to tasks and nodding off during EOB testing at times; decreased problem solving noted, continue further assessment    General Comments       Exercises     Shoulder Instructions      Home Living Family/patient expects to be discharged to:: Private residence Living Arrangements: (P) Other (Comment) (nephew and sister) Available Help at Discharge: (P) Family Type of Home: (P) House Home Access: (P) Level entry     Home Layout: (P) One level      Bathroom Shower/Tub: (P) Tub/shower unit   Bathroom Toilet: (P) Standard     Home Equipment: (P) None      Lives With: Other (Comment) (stated he does not live alone)    Prior Functioning/Environment Level of Independence: (P) Independent        Comments: (P) driving, independent         OT Problem List: Impaired vision/perception;Decreased coordination;Decreased cognition;Decreased safety awareness;Decreased knowledge of use of DME or AE;Obesity      OT Treatment/Interventions: Self-care/ADL training;Neuromuscular education;Energy conservation;DME and/or AE instruction;Therapeutic activities;Cognitive remediation/compensation;Visual/perceptual remediation/compensation;Patient/family education;Balance training    OT Goals(Current goals can be found in the care plan section) Acute Rehab OT Goals Patient Stated Goal: home  OT Goal Formulation: With patient Time For Goal Achievement: 06/16/20 Potential to Achieve Goals: Good  OT Frequency: Min 2X/week   Barriers to D/C:            Co-evaluation              AM-PAC OT "6 Clicks" Daily Activity     Outcome Measure Help from another person eating meals?: A Little Help from another person taking care of personal grooming?: A Little Help from another person toileting, which includes using toliet, bedpan, or urinal?: A Little Help from another person bathing (including washing, rinsing, drying)?: A Little Help from another person to put on and taking off regular upper body clothing?: A Little Help from another person to put on and taking off regular lower body clothing?: A  Little 6 Click Score: 18   End of Session Equipment Utilized During Treatment: Gait belt Nurse Communication: Mobility status  Activity Tolerance: Patient tolerated treatment well Patient left: Other (comment) (handoff to PT )  OT Visit Diagnosis: Other abnormalities of gait and mobility (R26.89);Other symptoms and signs involving the nervous  system (R29.898)                Time: 5409-8119 OT Time Calculation (min): 15 min Charges:  OT General Charges $OT Visit: 1 Visit OT Evaluation $OT Eval Moderate Complexity: 1 Mod  Barry Brunner, OT Acute Rehabilitation Services Pager [phone] Office [phone]   Chancy Milroy 06/02/2020, 3:45 PM

## 2020-06-02 NOTE — Evaluation (Signed)
Speech Language Pathology Evaluation Patient Details Name: [name] MRN: [id number] DOB: [birth year] Today's Date: 06/02/2020 Time: 1540-0867 SLP Time Calculation (min) (ACUTE ONLY): 27 min  Problem List:  Patient Active Problem List   Diagnosis Date Noted  . 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 Acute cerebrovascular accident (CVA) (HCC) 06/01/2020  . Paronychia of right thumb 05/10/2020  . Mediastinal adenopathy 01/06/2020  . Secondary hypercoagulable state (HCC) 10/22/2019  . Palpitations 09/30/2019  . Acute respiratory failure with hypoxia (HCC) 09/30/2019  . SVT (supraventricular tachycardia) (HCC) 07/08/2019  . Acute pancreatitis 01/22/2019  . Class 3 obesity due to excess calories with serious comorbidity and body mass index (BMI) of 40.0 to 44.9 in adult 12/08/2016  . Paroxysmal atrial fibrillation (HCC) 01/23/2013  . Diabetes mellitus (HCC) 01/23/2013  . Hypertensive heart disease 01/23/2013  . Tobacco user 01/23/2013  . Chronic diastolic heart failure (HCC) 01/21/2013  . Community acquired pneumonia 01/20/2013  . 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 Shoulder pain, right 02/15/2011  . Seasonal and perennial allergic rhinitis 02/13/2008  . Dyslipidemia 02/12/2008  . OBESITY, MORBID 02/12/2008  . Cocaine abuse (HCC) 02/12/2008  . Obstructive sleep apnea 02/12/2008   Past Medical History:  Past Medical History:  Diagnosis Date  . Diabetes mellitus   . GERD (gastroesophageal reflux disease)   . History of alcohol abuse   . History of cocaine use   . History of echocardiogram    a. Echo 4/14: Moderate LVH, vigorous LVEF, EF 65-70%, normal wall motion, grade 2 diastolic dysfunction, mildly dilated aortic root and ascending aorta, ascending aorta 40 mm, aortic root 38 mm, mild LAE  . Hx of cardiovascular stress test    a. GXT 5/14: No ischemic changes  //  b. ETT-Myoview 3/16:  Low risk, no ischemia, EF 58%  . Hyperlipidemia   .  Hypertension   . Morbid obesity (HCC)   . Paroxysmal atrial fibrillation (HCC)    Occurring in 2008, with several recurrence since then (including in the setting of + cocaine on UDS).  . Sleep apnea    uses CPAP  . SVT (supraventricular tachycardia) (HCC)    mid to long RP SVT 09/2019   Past Surgical History:  Past Surgical History:  Procedure Laterality Date  . APPENDECTOMY    . ATRIAL FIBRILLATION ABLATION N/A 11/27/2019   Procedure: ATRIAL FIBRILLATION ABLATION;  Surgeon: [name], MD;  Location: [facility];  Service: Cardiovascular;  Laterality: N/A;  . ROTATOR CUFF REPAIR    . SVT ABLATION N/A 11/27/2019   Procedure: SVT ABLATION;  Surgeon: [name], MD;  Location: [facility];  Service: Cardiovascular;  Laterality: N/A;  . TONSILLECTOMY     HPI:  [name] is a 49 y.o. male with medical history significant of diabetes, hypertension, morbid obesity, GERD, history of polysubstance abuse including cocaine and alcohol, who presents to the ER with onset of dysarthric speech which continued. MRI of the brain showed small acute infarct of the left caudate body.     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 He has stuttered since a child and received speech therapy services. Pt was given the SLUMS cognitive assessment and scored a 19/20 in the severely cognitively impaired range and exhibits mild-mod dysarthria. Most difficulty was with memory, backward digit repetition, and clock drawing/accurate time. Speech  will follow for speech intelligibility and compensatory strategies to facilitate memory.            SLP Assessment  SLP Recommendation/Assessment: Patient needs continued Speech Lanaguage Pathology Services SLP Visit Diagnosis: Dysarthria and anarthria (R47.1);Cognitive communication  deficit (R41.841)    Follow Up Recommendations  Other (comment) (could benefit from home health)    Frequency and Duration min 2x/week  2 weeks      SLP Evaluation Cognition  Overall Cognitive Status: Impaired/Different from baseline (may not be way off from baseline) Arousal/Alertness:  (awake but drowsy) Orientation Level: Oriented X4 Attention: Sustained Sustained Attention: Appears intact Memory: Impaired Memory Impairment: Retrieval deficit;Storage deficit Awareness: Appears intact Problem Solving: Impaired Problem Solving Impairment: Functional complex Safety/Judgment: Appears intact       Comprehension  Auditory Comprehension Overall Auditory Comprehension: Appears within functional limits for tasks assessed Visual Recognition/Discrimination Discrimination: Not tested Reading Comprehension Reading Status: Not tested    Expression Expression Primary Mode of Expression: Verbal Verbal Expression Overall Verbal Expression: Impaired Initiation: Impaired Level of Generative/Spontaneous Verbalization: Conversation Naming: No impairment Pragmatics: No impairment Written Expression Dominant Hand: Right Written Expression: Not tested   Oral / Motor  Oral Motor/Sensory Function Overall Oral Motor/Sensory Function: Mild impairment Facial ROM: Reduced right;Suspected CN VII (facial) dysfunction Facial Symmetry: Abnormal symmetry right;Suspected CN VII (facial) dysfunction Facial Strength: Reduced right;Suspected CN VII (facial) dysfunction Facial Sensation:  (states normal) Lingual ROM: Within Functional Limits Motor Speech Overall Motor Speech: Impaired Respiration: Within functional limits Phonation: Normal Resonance: Within functional limits Articulation: Impaired Level of Impairment: Phrase Intelligibility: Intelligibility reduced Word: 75-100% accurate Phrase: 75-100% accurate Sentence: 75-100% accurate Conversation: 75-100% accurate Motor Planning:  Witnin functional limits   GO                    [name] 06/02/2020, 2:26 PM  [name] [name] M.Ed Nurse, children's [phone] Office [phone]

## 2020-06-02 NOTE — Evaluation (Signed)
Physical Therapy Evaluation Patient Details Name: [name] MRN: [id number] DOB: 02/17/1971 Today's Date: 06/02/2020   History of Present Illness  Pt is a 49 y/o male with PMH of diabetes, HTN, morbid obesity, history of polysubstance abuse (cocaine and alcohol), presents to ER with dysarthric speech, some L sided weakness. MRI reveals acute L caudate infarct.   Clinical Impression  Pt is close to baseline functioning and should be safe at home with available assist. There are no further acute PT needs.  Will sign off at this time.     Follow Up Recommendations No PT follow up    Equipment Recommendations  None recommended by PT    Recommendations for Other Services       Precautions / Restrictions Precautions Precautions: None Restrictions Weight Bearing Restrictions: No      Mobility  Bed Mobility Overal bed mobility: Modified Independent             General bed mobility comments: sitting EOB on arrival  Transfers Overall transfer level: Independent Equipment used: None Transfers: Sit to/from Stand Sit to Stand: [employer] transfer comment: min guard fading to supervision  Ambulation/Gait Ambulation/Gait assistance: Supervision;Independent (generally independent in a home-like environment) Gait Distance (Feet): 360 Feet Assistive device: None Gait Pattern/deviations: Step-through pattern   Gait velocity interpretation: >4.37 ft/sec, indicative of normal walking speed General Gait Details: pt was steady overall, able to change speeds, make abrupt directional changes  Stairs Stairs: Yes   Stair Management: One rail Right;Alternating pattern;Forwards Number of Stairs: 3 General stair comments: safe with rails  Wheelchair Mobility    Modified Rankin (Stroke Patients Only) Modified Rankin (Stroke Patients Only) Pre-Morbid Rankin Score: No symptoms Modified Rankin: No significant disability     Balance  Overall balance assessment: Needs assistance Sitting-balance support: No upper extremity supported;Feet supported Sitting balance-Leahy Scale: Good     Standing balance support: No upper extremity supported;During functional activity Standing balance-Leahy Scale: Good                   Standardized Balance Assessment Standardized Balance Assessment : Dynamic Gait Index   Dynamic Gait Index Level Surface: Normal Change in Gait Speed: Normal Gait with Horizontal Head Turns: Normal Gait with Vertical Head Turns: Normal Gait and Pivot Turn: Normal Step Over Obstacle: Mild Impairment Step Around Obstacles: Normal Steps: Mild Impairment Total Score: 22       Pertinent Vitals/Pain Pain Assessment: No/denies pain    Home Living Family/patient expects to be discharged to:: Private residence Living Arrangements: Other (Comment) (nephew and sister) Available Help at Discharge: Family Type of Home: House Home Access: Level entry     Home Layout: One level Home Equipment: None      Prior Function Level of Independence: Independent         Comments: driving, independent      Hand Dominance   Dominant Hand: Right    Extremity/Trunk Assessment   Upper Extremity Assessment Upper Extremity Assessment: Defer to OT evaluation LUE Deficits / Details: grossly 4+/5 MMT, mild decreased coordinatino  LUE Sensation: WNL LUE Coordination: decreased fine motor;decreased gross motor    Lower Extremity Assessment Lower Extremity Assessment: Overall WFL for tasks assessed    Cervical / Trunk Assessment Cervical / Trunk Assessment: Other exceptions Cervical / Trunk Exceptions: increased body habitus   Communication   Communication: Expressive difficulties  Cognition Arousal/Alertness: Awake/alert Behavior During Therapy: Flat affect Overall Cognitive Status:  (NT  formally) Area of Impairment: Attention;Following commands;Problem solving;Awareness                    Current Attention Level: Sustained   Following Commands: Follows one step commands consistently;Follows one step commands with increased time;Follows multi-step commands inconsistently   Awareness: Emergent Problem Solving: Slow processing;Difficulty sequencing;Requires verbal cues General Comments: patient asleep upon entry but awakens easily, oriented and follows commands; difficulty attending to tasks and nodding off during EOB testing at times; decreased problem solving noted, continue further assessment       General Comments General comments (skin integrity, edema, etc.): low fall risk    Exercises     Assessment/Plan    PT Assessment Patent does not need any further PT services  PT Problem List         PT Treatment Interventions      PT Goals (Current goals can be found in the Care Plan section)  Acute Rehab PT Goals Patient Stated Goal: home  PT Goal Formulation: All assessment and education complete, DC therapy    Frequency     Barriers to discharge        Co-evaluation               AM-PAC PT "6 Clicks" Mobility  Outcome Measure Help needed turning from your back to your side while in a flat bed without using bedrails?: None Help needed moving from lying on your back to sitting on the side of a flat bed without using bedrails?: None Help needed moving to and from a bed to a chair (including a wheelchair)?: None Help needed standing up from a chair using your arms (e.g., wheelchair or bedside chair)?: None Help needed to walk in hospital room?: None Help needed climbing 3-5 steps with a railing? : None 6 Click Score: 24    End of Session   Activity Tolerance: Patient tolerated treatment well Patient left: in bed;with call bell/phone within reach Nurse Communication: Mobility status PT Visit Diagnosis: Unsteadiness on feet (R26.81)    Time: 1350-1411 PT Time Calculation (min) (ACUTE ONLY): 21 min   Charges:   PT Evaluation $PT Eval Low  Complexity: 1 Low          06/02/2020  Jacinto Halim., PT Acute Rehabilitation Services [phone]  (pager) [phone]  (office)  [name] 06/02/2020, 3:48 PM

## 2020-06-02 NOTE — Progress Notes (Signed)
OT Cancellation Note  Patient Details Name: [name] MRN: [id number] DOB: [birth year]   Cancelled Treatment:    Reason Eval/Treat Not Completed: Patient at procedure or test/ unavailable. Will follow and see as able.   Barry Brunner, OT Acute Rehabilitation Services Pager [phone] Office [phone]   [name] 06/02/2020, 10:35 AM

## 2020-06-02 NOTE — Plan of Care (Signed)
  Problem: Education: Goal: Knowledge of disease or condition will improve Outcome: Progressing   Problem: Nutrition: Goal: Adequate nutrition will be maintained Outcome: Progressing   Problem: Pain Managment: Goal: General experience of comfort will improve Outcome: Progressing

## 2020-06-02 NOTE — Discharge Summary (Signed)
Physician Discharge Summary  [name] ZOX:[id number] DOB: 1971-02-24 DOA: 06/01/2020  PCP: Douglas Dills, MD  Admit date: 06/01/2020 Discharge date: 06/02/2020  Admitted From: home Disposition:  home  Recommendations for Outpatient Follow-up:  1. Follow up with PCP in 1-2 weeks  Home Health: none Equipment/Devices: none  Discharge Condition: stable CODE STATUS: Full code Diet recommendation: carb modified  HPI: Per admitting MD, [name] is a 49 y.o. male with medical history significant of diabetes, hypertension, morbid obesity, GERD, history of polysubstance abuse including cocaine and alcohol, who presents to the ER with onset of dysarthric speech that started yesterday around 10:00 in the morning.  Patient did not think of it as anything serious.  He was working on Psychologist, occupational when he started feeling fatigue.  He sat down initially.  Later went to [name] to get a meal and noted that he could not communicate himself very well to the cashier.  He was not making enough sense.  Following that multiple people told him his speech was slurred.  Patient also noted being slow dealing with other people and his thought process.  He had one episode of dropping stuff to the floor and feeling clumsy.  Has also almost had a problem while driving so this all made him come to the ER for evaluation.  In the ER he was initially worked up.  Due to his history of polysubstance abuse this was evaluated.  But patient denied using any substances including alcohol.  Head CT on subsequent MRI of the brain now showed small acute CVA.  Neurology consulted and patient is being admitted for evaluation and treatment of possible CVA.  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  MRI on admission showed a small acute infarct in the left caudate body as well as atrophic and chronic ischemic microangiopathy.  Neurology was consulted and  followed patient while hospitalized.  MRI was normal.  A 2D echo was done showed EF 60-65%, normal LVEF, no WMA.  RV is normal.  Lipid panel showed elevated triglycerides 333 and LDL of 103.  His statin has been increased.  A1c is 9.9.  Patient reported noncompliance with Xarelto, discussed with neurology and he is to resume Xarelto on discharge, stressed importance of compliance with daily dosing.  He expressed understanding.  Physical therapy evaluated patient without need for follow-up.  Type 2 diabetes mellitus, poorly controlled, with hyperglycemia-patient noncompliant with any of his home medications or dietary changes.  Stressed importance, he states that he will take his medications as indicated and try to make dietary changes.  PCP follow-up.  Paroxysmal A. fib-continue Xarelto.  Stressed importance of compliance as this is the main reason why he had a CVA.  Continue flecainide, outpatient cardiology follow-up.  Full read for 2D echo as below.  ADHD-on Adderall as an outpatient  Essential hypertension-continue home regimen  Morbid obesity-BMI 39.8, patient will benefit from weight loss  Discharge Instructions  Discharge Instructions    Ambulatory referral to Nutrition and Diabetic Education   Complete by: As directed      Allergies as of 06/02/2020      Reactions   Shrimp [shellfish Allergy] Anaphylaxis   Banana Other (See Comments)   Pt gags   Watermelon Flavor Nausea And Vomiting   Peanut-containing Drug Products Itching, Cough   Almond Oil Itching   Roof of mouth itches   Sulfa Antibiotics Itching      Medication List  STOP taking these medications   doxycycline 100 MG capsule Commonly known as: VIBRAMYCIN     TAKE these medications   amphetamine-dextroamphetamine 30 MG 24 hr capsule Commonly known as: ADDERALL XR Take 1 capsule (30 mg total) by mouth daily as needed (focus). 1 daily as needed What changed: additional instructions   atorvastatin 40 MG  tablet Commonly known as: LIPITOR Take 1 tablet (40 mg total) by mouth daily. Start taking on: June 03, 2020 What changed:   medication strength  how much to take   Blood Pressure Monitor/L Cuff Misc 1 Units by Does not apply route daily.   diltiazem 30 MG tablet Commonly known as: Cardizem Take 1 Tablet Every 4 Hours As Needed For HR >100 What changed:   how much to take  how to take this  when to take this  reasons to take this  additional instructions   flecainide 100 MG tablet Commonly known as: TAMBOCOR Take 1 tablet (100 mg total) by mouth 2 (two) times daily.   glipiZIDE 10 MG tablet Commonly known as: GLUCOTROL Take 10 mg by mouth 2 (two) times daily.   lisinopril 40 MG tablet Commonly known as: ZESTRIL Take 40 mg by mouth daily.   metFORMIN 500 MG tablet Commonly known as: GLUCOPHAGE Take 1,000 mg by mouth 2 (two) times daily with a meal.   metoprolol succinate 50 MG 24 hr tablet Commonly known as: TOPROL-XL Take 1 tablet (50 mg total) by mouth daily. Take with or immediately following a meal.   ondansetron 8 MG disintegrating tablet Commonly known as: Zofran ODT Take 1 tablet (8 mg total) by mouth every 8 (eight) hours as needed for nausea or vomiting.   pantoprazole 40 MG tablet Commonly known as: PROTONIX TAKE 1 TABLET BY MOUTH EVERY DAY   Xarelto 20 MG Tabs tablet Generic drug: rivaroxaban TAKE 1 TABLET (20 MG TOTAL) BY MOUTH DAILY WITH SUPPER. What changed: See the new instructions.        Consultations:  Neurology   Procedures/Studies:  DG Chest 2 View  Result Date: 05/04/2020 CLINICAL DATA:  Shortness of breath EXAM: CHEST - 2 VIEW COMPARISON:  01/06/2020 FINDINGS: Normal heart size. Bilateral hilar prominence likely reflecting adenopathy seen prior chest CT. Both lungs are clear. No pleural effusion or pneumothorax. The visualized skeletal structures are unremarkable. IMPRESSION: No acute process in the chest. There is  bilateral hilar prominence likely reflecting adenopathy seen on prior chest CT. Electronically Signed   By: Guadlupe SpanishPraneil  Patel [name]   On: 05/04/2020 09:27   CT HEAD WO CONTRAST  Result Date: 06/01/2020 CLINICAL DATA:  Acute neuro deficit.  Slurred speech and confusion. EXAM: CT HEAD WITHOUT CONTRAST TECHNIQUE: Contiguous axial images were obtained from the base of the skull through the vertex without intravenous contrast. COMPARISON:  None. FINDINGS: Brain: Generalized atrophy, most prominent around the sylvian fissure bilaterally. Negative for hydrocephalus. Bilateral white matter hypodensities compatible with microvascular ischemia. Focal hypodensities in the corona radiata bilaterally could be subacute or chronic infarct. Negative for hemorrhage or mass.  No midline shift. Vascular: Atherosclerotic calcification carotid and vertebral arteries. Negative for hyperdense vessel Skull: Negative Sinuses/Orbits: Mild mucosal edema paranasal sinuses. Negative orbit Other: None IMPRESSION: Generalized atrophy, greater than expected for age Bilateral white matter disease most compatible with microvascular ischemia also more prominent than expected for age. Focal hypodensities in the corona radiata bilaterally could represent subacute or chronic ischemia. No intracranial hemorrhage. Electronically Signed   By: Marlan Palauharles  Clark [name]  On: 06/01/2020 11:14   MR ANGIO HEAD WO CONTRAST  Result Date: 06/01/2020 CLINICAL DATA:  Neuro deficit, acute, stroke suspected EXAM: MRA HEAD WITHOUT CONTRAST TECHNIQUE: Angiographic images of the Circle of Willis were obtained using MRA technique without intravenous contrast. COMPARISON:  None. FINDINGS: POSTERIOR CIRCULATION: --Vertebral arteries: Normal V4 segments. --Inferior cerebellar arteries: Normal. --Basilar artery: Normal. --Superior cerebellar arteries: Normal. --Posterior cerebral arteries: Normal. ANTERIOR CIRCULATION: --Intracranial internal carotid arteries: Normal.  --Anterior cerebral arteries (ACA): Normal. Both A1 segments are present. Patent anterior communicating artery (a-comm). --Middle cerebral arteries (MCA): Normal. IMPRESSION: Normal intracranial MRA. Electronically Signed   By: Deatra Robinson [name]   On: 06/01/2020 23:35   MR ANGIO NECK WO CONTRAST  Result Date: 06/01/2020 CLINICAL DATA:  Neuro deficit, acute, stroke suspected EXAM: MRA NECK WITHOUT CONTRAST TECHNIQUE: Angiographic images of the neck were obtained using MRA technique without intravenous contrast. COMPARISON:  None. FINDINGS: Time-of-flight images of the carotid and vertebral arteries are normal. IMPRESSION: Normal MRA of the neck. Electronically Signed   By: Deatra Robinson [name]   On: 06/01/2020 23:41   MR Brain Wo Contrast (neuro protocol)  Result Date: 06/01/2020 CLINICAL DATA:  Slurred speech EXAM: MRI HEAD WITHOUT CONTRAST TECHNIQUE: Multiplanar, multiecho pulse sequences of the brain and surrounding structures were obtained without intravenous contrast. COMPARISON:  None. 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 Early confluent hyperintense T2-weighted signal of the periventricular and deep white matter, most commonly due to chronic ischemic microangiopathy. There is an old right corona radiata small vessel infarct. Advanced atrophy for age. No chronic microhemorrhage. Normal midline structures. Vascular: Normal flow voids. Skull and upper cervical spine: Normal marrow signal. Sinuses/Orbits: Negative. Other: None. IMPRESSION: 1. 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 2. Advanced atrophy and chronic ischemic microangiopathy. Electronically Signed   By: Deatra Robinson [name]   On: 06/01/2020 19:40   MR Cervical Spine Wo Contrast  Result Date: 06/01/2020 CLINICAL DATA:  Myelopathy, acute or progressive EXAM: MRI CERVICAL SPINE WITHOUT CONTRAST TECHNIQUE: Multiplanar, multisequence MR imaging of the cervical spine was performed. No intravenous contrast was  administered. COMPARISON:  None. FINDINGS: Alignment: Physiologic. Vertebrae: No fracture, evidence of discitis, or bone lesion. Cord: Normal signal and morphology. Posterior Fossa, vertebral arteries, paraspinal tissues: Negative. Disc levels: There is no disc herniation, spinal canal stenosis or neural impingement. IMPRESSION: Normal MRI of the cervical spine. Electronically Signed   By: Deatra Robinson [name]   On: 06/01/2020 23:39   ECHOCARDIOGRAM COMPLETE  Result Date: 06/02/2020    ECHOCARDIOGRAM REPORT   Patient Name:   [name] Date of Exam: 06/02/2020 Medical Rec #:  [id number]             Height:       69.0 in Accession #:    [id number]            Weight:       270.0 lb Date of Birth:  04-04-1971             BSA:          2.347 m Patient Age:    49 years              BP:           165/100 mmHg Patient Gender: M                     HR:  82 bpm. Exam Location:  Inpatient Procedure: 2D Echo Indications:    Stroke I163.9  History:        Patient has prior history of Echocardiogram examinations, most                 recent 07/08/2019. Arrythmias:Atrial Fibrillation; Risk                 Factors:Hypertension, Dyslipidemia and Diabetes.  Sonographer:    Thurman Coyer RDCS (AE) Referring Phys: [id number] MOHAMMAD L GARBA IMPRESSIONS  1. Some flow acceleration around basal septum but unable to visulize any SAM. Left ventricular ejection fraction, by estimation, is 60 to 65%. The left ventricle has normal function. The left ventricle has no regional wall motion abnormalities. There is  mild asymmetric left ventricular hypertrophy of the basal and septal segments. Left ventricular diastolic parameters were normal.  2. Right ventricular systolic function is normal. The right ventricular size is normal.  3. Left atrial size was mildly dilated.  4. The mitral valve is normal in structure. Trivial mitral valve regurgitation. No evidence of mitral stenosis.  5. The aortic valve is normal in structure.  Aortic valve regurgitation is not visualized. No aortic stenosis is present.  6. Aortic dilatation noted. There is mild dilatation of the aortic root measuring 38 mm.  7. The inferior vena cava is normal in size with greater than 50% respiratory variability, suggesting right atrial pressure of 3 mmHg. FINDINGS  Left Ventricle: Some flow acceleration around basal septum but unable to visulize any SAM. Left ventricular ejection fraction, by estimation, is 60 to 65%. The left ventricle has normal function. The left ventricle has no regional wall motion abnormalities. The left ventricular internal cavity size was normal in size. There is mild asymmetric left ventricular hypertrophy of the basal and septal segments. Left ventricular diastolic parameters were normal. Right Ventricle: The right ventricular size is normal. No increase in right ventricular wall thickness. Right ventricular systolic function is normal. Left Atrium: Left atrial size was mildly dilated. Right Atrium: Right atrial size was normal in size. Pericardium: There is no evidence of pericardial effusion. Mitral Valve: The mitral valve is normal in structure. There is mild thickening of the mitral valve leaflet(s). Normal mobility of the mitral valve leaflets. Trivial mitral valve regurgitation. No evidence of mitral valve stenosis. Tricuspid Valve: The tricuspid valve is normal in structure. Tricuspid valve regurgitation is not demonstrated. No evidence of tricuspid stenosis. Aortic Valve: The aortic valve is normal in structure. Aortic valve regurgitation is not visualized. No aortic stenosis is present. Pulmonic Valve: The pulmonic valve was normal in structure. Pulmonic valve regurgitation is not visualized. No evidence of pulmonic stenosis. Aorta: The aortic root is normal in size and structure and aortic dilatation noted. There is mild dilatation of the aortic root measuring 38 mm. Venous: The inferior vena cava is normal in size with greater than  50% respiratory variability, suggesting right atrial pressure of 3 mmHg. IAS/Shunts: No atrial level shunt detected by color flow Doppler.  LEFT VENTRICLE PLAX 2D LVIDd:         4.40 cm  Diastology LVIDs:         2.80 cm  LV e' lateral:   5.77 cm/s LV PW:         1.10 cm  LV E/e' lateral: 11.6 LV IVS:        1.30 cm  LV e' medial:    4.68 cm/s LVOT diam:     2.80 cm  LV E/e' medial:  14.3 LV SV:         163 LV SV Index:   69 LVOT Area:     6.16 cm  RIGHT VENTRICLE RV S prime:     14.60 cm/s TAPSE (M-mode): 2.2 cm LEFT ATRIUM             Index       RIGHT ATRIUM           Index LA diam:        4.30 cm 1.83 cm/m  RA Area:     11.50 cm LA Vol (A2C):   45.3 ml 19.30 ml/m RA Volume:   22.90 ml  9.76 ml/m LA Vol (A4C):   61.3 ml 26.12 ml/m LA Biplane Vol: 53.7 ml 22.88 ml/m  AORTIC VALVE LVOT Vmax:   153.00 cm/s LVOT Vmean:  93.400 cm/s LVOT VTI:    0.264 m  AORTA Ao Root diam: 3.80 cm Ao Asc diam:  4.10 cm MITRAL VALVE MV Area (PHT): 2.34 cm    SHUNTS MV Decel Time: 324 msec    Systemic VTI:  0.26 m MV E velocity: 67.10 cm/s  Systemic Diam: 2.80 cm MV A velocity: 77.90 cm/s MV E/A ratio:  0.86 Charlton Haws MD Electronically signed by Charlton Haws MD Signature Date/Time: 06/02/2020/11:10:27 AM    Final       Subjective: - no chest pain, shortness of breath, no abdominal pain, nausea or vomiting.   Discharge Exam: BP 132/78   Pulse 70   Temp 97.9 F (36.6 C) (Oral)   Resp 18   Ht 5\' 9"  (1.753 m)   Wt 122.5 kg   SpO2 97%   BMI 39.87 kg/m   General: Pt is alert, awake, not in acute distress Cardiovascular: RRR, S1/S2 +, no rubs, no gallops Respiratory: CTA bilaterally, no wheezing, no rhonchi Abdominal: Soft, NT, ND, bowel sounds + Extremities: no edema, no cyanosis    The results of significant diagnostics from this hospitalization (including imaging, microbiology, ancillary and laboratory) are listed below for reference.     Microbiology: Recent Results (from the past 240 hour(s))   SARS Coronavirus 2 by RT PCR (hospital order, performed in [facility] hospital lab) Nasopharyngeal Nasopharyngeal Swab     Status: None   Collection Time: 06/01/20  9:10 PM   Specimen: Nasopharyngeal Swab  Result Value Ref Range Status   SARS Coronavirus 2 NEGATIVE NEGATIVE Final    Comment: (NOTE) SARS-CoV-2 target nucleic acids are NOT DETECTED.  The SARS-CoV-2 RNA is generally detectable in upper and lower respiratory specimens during the acute phase of infection. The lowest concentration of SARS-CoV-2 viral copies this assay can detect is 250 copies / mL. A negative result does not preclude SARS-CoV-2 infection and should not be used as the sole basis for treatment or other patient management decisions.  A negative result may occur with improper specimen collection / handling, submission of specimen other than nasopharyngeal swab, presence of viral mutation(s) within the areas targeted by this assay, and inadequate number of viral copies (<250 copies / mL). A negative result must be combined with clinical observations, patient history, and epidemiological information.  Fact Sheet for Patients:   06/03/20  Fact Sheet for Healthcare Providers: BoilerBrush.com.cy  This test is not yet approved or  cleared by the https://pope.com/ FDA and has been authorized for detection and/or diagnosis of SARS-CoV-2 by FDA under an Emergency Use Authorization (EUA).  This EUA will remain in effect (meaning this  test can be used) for the duration of the COVID-19 declaration under Section 564(b)(1) of the Act, 21 U.S.C. section 360bbb-3(b)(1), unless the authorization is terminated or revoked sooner.  Performed at [facility], [address], [place], [place] [address]      Labs: Basic Metabolic Panel: Recent Labs  Lab 06/01/20 1010 06/01/20 1047  NA 133* 137  K 5.7* 5.4*  CL 102 103  CO2 23  --   GLUCOSE 335* 337*  BUN  26* 35*  CREATININE 1.40* 1.30*  CALCIUM 8.9  --    Liver Function Tests: Recent Labs  Lab 06/01/20 1010  AST 25  ALT 22  ALKPHOS 121  BILITOT 1.0  PROT 7.4  ALBUMIN 3.2*   CBC: Recent Labs  Lab 06/01/20 1010 06/01/20 1047  WBC 7.3  --   NEUTROABS 4.5  --   HGB 13.0 13.3  HCT 40.4 39.0  MCV 86.0  --   PLT 315  --    CBG: Recent Labs  Lab 06/01/20 1013 06/01/20 2108 06/02/20 0635  GLUCAP 312* 194* 264*   Hgb A1c Recent Labs    06/02/20 0147  HGBA1C 9.9*   Lipid Profile Recent Labs    06/02/20 0147  CHOL 196  HDL 26*  LDLCALC 103*  TRIG 333*  CHOLHDL 7.5   Thyroid function studies No results for input(s): TSH, T4TOTAL, T3FREE, THYROIDAB in the last 72 hours.  Invalid input(s): FREET3 Urinalysis    Component Value Date/Time   COLORURINE AMBER (A) 05/04/2020 1258   APPEARANCEUR CLOUDY (A) 05/04/2020 1258   LABSPEC 1.019 05/04/2020 1258   PHURINE 5.0 05/04/2020 1258   GLUCOSEU 50 (A) 05/04/2020 1258   HGBUR SMALL (A) 05/04/2020 1258   BILIRUBINUR NEGATIVE 05/04/2020 1258   KETONESUR NEGATIVE 05/04/2020 1258   PROTEINUR 100 (A) 05/04/2020 1258   UROBILINOGEN 1.0 11/19/2013 1855   NITRITE NEGATIVE 05/04/2020 1258   LEUKOCYTESUR NEGATIVE 05/04/2020 1258    FURTHER DISCHARGE INSTRUCTIONS:   Get Medicines reviewed and adjusted: Please take all your medications with you for your next visit with your Primary MD   Laboratory/radiological data: Please request your Primary MD to go over all hospital tests and procedure/radiological results at the follow up, please ask your Primary MD to get all Hospital records sent to his/her office.   In some cases, they will be blood work, cultures and biopsy results pending at the time of your discharge. Please request that your primary care [name] goes through all the records of your hospital data and follows up on these results.   Also Note the following: If you experience worsening of your admission symptoms,  develop shortness of breath, life threatening emergency, suicidal or homicidal thoughts you must seek medical attention immediately by calling 911 or calling your MD immediately  if symptoms less severe.   You must read complete instructions/literature along with all the possible adverse reactions/side effects for all the Medicines you take and that have been prescribed to you. Take any new Medicines after you have completely understood and accpet all the possible adverse reactions/side effects.    Do not drive when taking Pain medications or sleeping medications (Benzodaizepines)   Do not take more than prescribed Pain, Sleep and Anxiety Medications. It is not advisable to combine anxiety,sleep and pain medications without talking with your primary care practitioner   Special Instructions: If you have smoked or chewed Tobacco  in the last 2 yrs please stop smoking, stop any regular Alcohol  and  or any Recreational drug use.   Wear Seat belts while driving.   Please note: You were cared for by a hospitalist during your hospital stay. Once you are discharged, your primary care physician will handle any further medical issues. Please note that NO REFILLS for any discharge medications will be authorized once you are discharged, as it is imperative that you return to your primary care physician (or establish a relationship with a primary care physician if you do not have one) for your post hospital discharge needs so that they can reassess your need for medications and monitor your lab values.  Time coordinating discharge: 40 minutes  SIGNED:  Pamella Pert, MD, PhD 06/02/2020, 4:23 PM

## 2020-06-02 NOTE — Progress Notes (Signed)
  Echocardiogram 2D Echocardiogram has been performed.  Tye Savoy 06/02/2020, 10:49 AM

## 2020-06-02 NOTE — Progress Notes (Signed)
STROKE TEAM PROGRESS NOTE   INTERVAL HISTORY I personally reviewed history of presenting illness with the patient, electronic medical records and imaging films in PACS.  He presented with fluctuating dysarthria and MRI scan shows a large left caudate subcortical infarct.  MRI of the neck and brain both did not show significant large vessel stenosis.  He has a history of A. fib and was taking Xarelto but admits to having missed a few doses recently.  He does have CPAP and states he does not use CPAP regularly.  LDL cholesterol is 103 mg percent hemoglobin A1c 9.9.  Echocardiogram results are pending.  Vitals:   06/01/20 2348 06/01/20 2354 06/02/20 0419 06/02/20 0819  BP: (!) 148/105  (!) 150/90 (!) 165/100  Pulse: 77  79 85  Resp: 19  20 20   Temp: 98.3 F (36.8 C)  98 F (36.7 C) 98.2 F (36.8 C)  TempSrc: Oral  Oral Axillary  SpO2: 99% 99% 96% 98%  Weight:      Height:       CBC:  Recent Labs  Lab 06/01/20 1010 06/01/20 1047  WBC 7.3  --   NEUTROABS 4.5  --   HGB 13.0 13.3  HCT 40.4 39.0  MCV 86.0  --   PLT 315  --    Basic Metabolic Panel:  Recent Labs  Lab 06/01/20 1010 06/01/20 1047  NA 133* 137  K 5.7* 5.4*  CL 102 103  CO2 23  --   GLUCOSE 335* 337*  BUN 26* 35*  CREATININE 1.40* 1.30*  CALCIUM 8.9  --    Lipid Panel:  Recent Labs  Lab 06/02/20 0147  CHOL 196  TRIG 333*  HDL 26*  CHOLHDL 7.5  VLDL 67*  LDLCALC 103*   HgbA1c:  Recent Labs  Lab 06/02/20 0147  HGBA1C 9.9*   Urine Drug Screen:  Recent Labs  Lab 06/01/20 2100  LABOPIA NONE DETECTED  COCAINSCRNUR NONE DETECTED  LABBENZ NONE DETECTED  AMPHETMU NONE DETECTED  THCU NONE DETECTED  LABBARB NONE DETECTED    Alcohol Level No results for input(s): ETH in the last 168 hours.  IMAGING past 24 hours MR ANGIO HEAD WO CONTRAST  Result Date: 06/01/2020 CLINICAL DATA:  Neuro deficit, acute, stroke suspected EXAM: MRA HEAD WITHOUT CONTRAST TECHNIQUE: Angiographic images of the [place] were obtained using MRA technique without intravenous contrast. COMPARISON:  None. FINDINGS: POSTERIOR CIRCULATION: --Vertebral arteries: Normal V4 segments. --Inferior cerebellar arteries: Normal. --Basilar artery: Normal. --Superior cerebellar arteries: Normal. --Posterior cerebral arteries: Normal. ANTERIOR CIRCULATION: --Intracranial internal carotid arteries: Normal. --Anterior cerebral arteries (ACA): Normal. Both A1 segments are present. Patent anterior communicating artery (a-comm). --Middle cerebral arteries (MCA): Normal. IMPRESSION: Normal intracranial MRA. Electronically Signed   By: 06/03/2020 [name]   On: 06/01/2020 23:35   MR ANGIO NECK WO CONTRAST  Result Date: 06/01/2020 CLINICAL DATA:  Neuro deficit, acute, stroke suspected EXAM: MRA NECK WITHOUT CONTRAST TECHNIQUE: Angiographic images of the neck were obtained using MRA technique without intravenous contrast. COMPARISON:  None. FINDINGS: Time-of-flight images of the carotid and vertebral arteries are normal. IMPRESSION: Normal MRA of the neck. Electronically Signed   By: 06/03/2020 [name]   On: 06/01/2020 23:41   MR Brain Wo Contrast (neuro protocol)  Result Date: 06/01/2020 CLINICAL DATA:  Slurred speech EXAM: MRI HEAD WITHOUT CONTRAST TECHNIQUE: Multiplanar, multiecho pulse sequences of the brain and surrounding structures were obtained without intravenous contrast. COMPARISON:  None. FINDINGS: Brain: Small acute infarct of the  left caudate body. Early confluent hyperintense T2-weighted signal of the periventricular and deep white matter, most commonly due to chronic ischemic microangiopathy. There is an old right corona radiata small vessel infarct. Advanced atrophy for age. No chronic microhemorrhage. Normal midline structures. Vascular: Normal flow voids. Skull and upper cervical spine: Normal marrow signal. Sinuses/Orbits: Negative. Other: None. IMPRESSION: 1. 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 2. Advanced atrophy  and chronic ischemic microangiopathy. Electronically Signed   By: Deatra Robinson [name]   On: 06/01/2020 19:40   MR Cervical Spine Wo Contrast  Result Date: 06/01/2020 CLINICAL DATA:  Myelopathy, acute or progressive EXAM: MRI CERVICAL SPINE WITHOUT CONTRAST TECHNIQUE: Multiplanar, multisequence MR imaging of the cervical spine was performed. No intravenous contrast was administered. COMPARISON:  None. FINDINGS: Alignment: Physiologic. Vertebrae: No fracture, evidence of discitis, or bone lesion. Cord: Normal signal and morphology. Posterior Fossa, vertebral arteries, paraspinal tissues: Negative. Disc levels: There is no disc herniation, spinal canal stenosis or neural impingement. IMPRESSION: Normal MRI of the cervical spine. Electronically Signed   By: Deatra Robinson [name]   On: 06/01/2020 23:39   ECHOCARDIOGRAM COMPLETE  Result Date: 06/02/2020    ECHOCARDIOGRAM REPORT   Patient Name:   CAILAN GENERAL Date of Exam: 06/02/2020 Medical Rec #:  [id number]             Height:       69.0 in Accession #:    [id number]            Weight:       270.0 lb Date of Birth:  25-Jul-1971             BSA:          2.347 m Patient Age:    49 years              BP:           165/100 mmHg Patient Gender: M                     HR:           82 bpm. Exam Location:  Inpatient Procedure: 2D Echo Indications:    Stroke I163.9  History:        Patient has prior history of Echocardiogram examinations, most                 recent 07/08/2019. Arrythmias:Atrial Fibrillation; Risk                 Factors:Hypertension, Dyslipidemia and Diabetes.  Sonographer:    Thurman Coyer RDCS (AE) Referring Phys: [id number] MOHAMMAD L GARBA IMPRESSIONS  1. Some flow acceleration around basal septum but unable to visulize any SAM. Left ventricular ejection fraction, by estimation, is 60 to 65%. The left ventricle has normal function. The left ventricle has no regional wall motion abnormalities. There is  mild asymmetric left ventricular hypertrophy of  the basal and septal segments. Left ventricular diastolic parameters were normal.  2. Right ventricular systolic function is normal. The right ventricular size is normal.  3. Left atrial size was mildly dilated.  4. The mitral valve is normal in structure. Trivial mitral valve regurgitation. No evidence of mitral stenosis.  5. The aortic valve is normal in structure. Aortic valve regurgitation is not visualized. No aortic stenosis is present.  6. Aortic dilatation noted. There is mild dilatation of the aortic root measuring 38 mm.  7. The inferior vena cava is normal in size  with greater than 50% respiratory variability, suggesting right atrial pressure of 3 mmHg. FINDINGS  Left Ventricle: Some flow acceleration around basal septum but unable to visulize any SAM. Left ventricular ejection fraction, by estimation, is 60 to 65%. The left ventricle has normal function. The left ventricle has no regional wall motion abnormalities. The left ventricular internal cavity size was normal in size. There is mild asymmetric left ventricular hypertrophy of the basal and septal segments. Left ventricular diastolic parameters were normal. Right Ventricle: The right ventricular size is normal. No increase in right ventricular wall thickness. Right ventricular systolic function is normal. Left Atrium: Left atrial size was mildly dilated. Right Atrium: Right atrial size was normal in size. Pericardium: There is no evidence of pericardial effusion. Mitral Valve: The mitral valve is normal in structure. There is mild thickening of the mitral valve leaflet(s). Normal mobility of the mitral valve leaflets. Trivial mitral valve regurgitation. No evidence of mitral valve stenosis. Tricuspid Valve: The tricuspid valve is normal in structure. Tricuspid valve regurgitation is not demonstrated. No evidence of tricuspid stenosis. Aortic Valve: The aortic valve is normal in structure. Aortic valve regurgitation is not visualized. No aortic  stenosis is present. Pulmonic Valve: The pulmonic valve was normal in structure. Pulmonic valve regurgitation is not visualized. No evidence of pulmonic stenosis. Aorta: The aortic root is normal in size and structure and aortic dilatation noted. There is mild dilatation of the aortic root measuring 38 mm. Venous: The inferior vena cava is normal in size with greater than 50% respiratory variability, suggesting right atrial pressure of 3 mmHg. IAS/Shunts: No atrial level shunt detected by color flow Doppler.  LEFT VENTRICLE PLAX 2D LVIDd:         4.40 cm  Diastology LVIDs:         2.80 cm  LV e' lateral:   5.77 cm/s LV PW:         1.10 cm  LV E/e' lateral: 11.6 LV IVS:        1.30 cm  LV e' medial:    4.68 cm/s LVOT diam:     2.80 cm  LV E/e' medial:  14.3 LV SV:         163 LV SV Index:   69 LVOT Area:     6.16 cm  RIGHT VENTRICLE RV S prime:     14.60 cm/s TAPSE (M-mode): 2.2 cm LEFT ATRIUM             Index       RIGHT ATRIUM           Index LA diam:        4.30 cm 1.83 cm/m  RA Area:     11.50 cm LA Vol (A2C):   45.3 ml 19.30 ml/m RA Volume:   22.90 ml  9.76 ml/m LA Vol (A4C):   61.3 ml 26.12 ml/m LA Biplane Vol: 53.7 ml 22.88 ml/m  AORTIC VALVE LVOT Vmax:   153.00 cm/s LVOT Vmean:  93.400 cm/s LVOT VTI:    0.264 m  AORTA Ao Root diam: 3.80 cm Ao Asc diam:  4.10 cm MITRAL VALVE MV Area (PHT): 2.34 cm    SHUNTS MV Decel Time: 324 msec    Systemic VTI:  0.26 m MV E velocity: 67.10 cm/s  Systemic Diam: 2.80 cm MV A velocity: 77.90 cm/s MV E/A ratio:  0.86 Charlton Haws MD Electronically signed by Charlton Haws MD Signature Date/Time: 06/02/2020/11:10:27 AM    Final  PHYSICAL EXAM Morbidly obese middle-aged African-American male not in distress. . Afebrile. Head is nontraumatic. Neck is supple without bruit.    Cardiac exam no murmur or gallop. Lungs are clear to auscultation. Distal pulses are well felt. Neurological Exam ;  Awake  Alert oriented x 3. Normal speech and language.eye movements full  without nystagmus.fundi were not visualized. Vision acuity and fields appear normal. Hearing is normal. Palatal movements are normal. Face symmetric. Tongue midline. Normal strength, tone, reflexes and coordination. Normal sensation. Gait deferred.  ASSESSMENT/PLAN Mr. Scherrie BatemanLeonidas Claude Wike is a 49 y.o. male with history of diabetes, hypertension, morbid obesity, GERD, history of polysubstance abuse including cocaine and alcohol presenting with persistent dysarthric speech.   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 Acute infarct, prominent Small vessel disease and Atrophy.   MRI  Small L caudate body infarct. Small vessel disease. Atrophy.   MR CS Unremarkable   MRA head Unremarkable   MRA neck Unremarkable   2D Echo EF 60-65%. No source of embolus. LA mildly dilated  LDL 103  HgbA1c 9.9  VTE prophylaxis - SCDs    Xarelto (rivaroxaban) daily prior to admission, now on aspirin 325 mg daily. Not taking xarelto as prescribed - resume xarelto and stop aspirin   Therapy recommendations:  HH SLP  Disposition:  pending   Atrial Fibrillation  Home anticoagulation:  Xarelto (rivaroxaban) daily   Patient states he missed several doses of Xarelto, typically takes about 7p w/ dinner   Now on aspirin, will stop and resume xarelto  INR 1.9 likely d/t xarelto side effect  . Continue Xarelto (rivaroxaban) daily at discharge   Hypertension  Stable . Permissive hypertension (OK if < 220/120) but gradually normalize in 5-7 days . Long-term BP goal normotensive  Hyperlipidemia  Home meds:  lipitor 10  Now on lipitor 40  LDL 103, goal < 70  Continue statin at discharge  Diabetes type II Uncontrolled  HgbA1c 9.9, goal < 7.0  Other Stroke Risk Factors  Cigarette smoker, advised to stop smoking  Hx ETOH abuse, alcohol level <10, advised to drink no more than  2 drink(s) a day  Hx Substance abuse (cocaine) - UDS:  THC NONE DETECTED, Cocaine NONE DETECTED. Patient advised to stop using due to stroke risk.  Obesity, Body mass index is 39.87 kg/m., recommend weight loss, diet and exercise as appropriate   Family hx stroke (Maternal Grandmother, Paternal Grandmother)  Obstructive sleep apnea, on CPAP at home  Other Active Problems  ADD on adderall prn  Hyperkalemia, AKI    Hospital day # 1 He presented with fluctuating dysarthria related to left caudate subcortical infarct from small vessel disease.  He has a history of atrial fibrillation and had not been compliant with Xarelto.  Patient counseled to be compliant take Xarelto every day with the largest meal of the day.  Aggressive risk factor modification with strict control of hypertension with blood pressure goal below 130/90, lipids with LDL cholesterol goal below 70 mg percent and diabetes with hemoglobin A1c goal below 6.5%.  He was also counseled to be compliant with using his CPAP every night for sleep apnea to quit smoking.  Greater than 50% time during this 35-minute visit was spent on counseling and coordination of care about his lacunar stroke and atrial fibrillation and discussion about stroke prevention and treatment and answering questions.  Discussed with Dr.  Costin. Delia Heady, MD To contact Stroke Continuity provider, please refer to [email or web address]. After hours, contact General Neurology

## 2020-06-02 NOTE — Progress Notes (Addendum)
Inpatient Diabetes Program Recommendations  AACE/ADA: New Consensus Statement on Inpatient Glycemic Control (2015)  Target Ranges:  Prepandial:   less than 140 mg/dL      Peak postprandial:   less than 180 mg/dL (1-2 hours)      Critically ill patients:  140 - 180 mg/dL   Lab Results  Component Value Date   GLUCAP 264 (H) 06/02/2020   HGBA1C 9.9 (H) 06/02/2020    Review of Glycemic Control Results for DARIVS, LUNDEN (MRN [id number]) as of 06/02/2020 08:53  Ref. Range 06/01/2020 10:13 06/01/2020 21:08 06/02/2020 06:35  Glucose-Capillary Latest Ref Range: 70 - 99 mg/dL 578 (H) 469 (H) 629 (H)   Diabetes history: Type 2 DM Outpatient Diabetes medications: Metformin 1000 mg BID, Glipizide 10 mg BID Current orders for Inpatient glycemic control: Glipizide 10 mg BID  Inpatient Diabetes Program Recommendations:    Consider the following: Novolog 0-15 units TID & Hs -Levemir 18 units QD.    Spoke with patient regarding outpatient diabetes management. Patient verifies home dosages, but admits that he has not been taking medications in quite some time. He reports stopping his medications because at a previous PCP appointment he was within goal and didn't think he needed them anymore.  Reviewed patient's current A1c of 9.9%. Explained what a A1c is and what it measures. Also reviewed goal A1c with patient, importance of good glucose control @ home, and blood sugar goals. Reviewed patho of DM, need for medications, role of pancreas, insulin resistances, impact of poor glycemic control and risk for further CVA, vascular changes and commorbidities.  Patient will need a meter at discharge. Blood glucose meter (includes lancets and strips) (#[id number]). Reviewed recommended frequency of CBG checks during the day and stressed when to call MD. Encouraged patient to follow up with PCP and bring log with him.  Admits to consuming large amounts of sugary beverages and "eating unhealthy". Reviewed at  length plate method, importance of being mindful, increasing protein options with CHO including snacks, CHO allotments with meals, goal setting and alternatives with sugary beverages. Dietitian consult placed. Also will place referral for outpatient education. Lastly, stressed the importance of taking medications as prescribed. Reviewed both outpatient medications benefits and action. Discussed goals for DM, the impact these drugs make on those goals and to continue taking once at goal to decrease risk of mortality.     Thanks, Lujean Rave, MSN, RNC-OB Diabetes Coordinator [phone] (8a-5p)

## 2020-06-02 NOTE — Progress Notes (Signed)
Pt's IV removed; site is clean, dry and intact. Discharge instructions reviewed with pt; voiced understanding. Personal belongings packed and transported with pt. Pt transported via wheelchair by staff to private vehicle driven by pt's sister.

## 2020-06-02 NOTE — TOC Transition Note (Signed)
Transition of Care [facility]) - CM/SW Discharge Note   Patient Details  Name: [name] MRN: [id number] Date of Birth: [birth year]  Transition of Care [facility]) CM/SW Contact:  Kermit Balo, RN Phone Number: 06/02/2020, 4:39 PM   Clinical Narrative:    Pt discharging home with self care. No f/u per PT/OT. Pt refused 3 in 1.  Pt states he has been noncompliant with his medications but plans on doing much better in the future.  Pt states he has transportation home.    Final next level of care: Home/Self Care Barriers to Discharge: No Barriers Identified   Patient Goals and CMS Choice        Discharge Placement                       Discharge Plan and Services                                     Social Determinants of Health (SDOH) Interventions     Readmission Risk Interventions No flowsheet data found.

## 2020-06-11 ENCOUNTER — Other Ambulatory Visit: Payer: Self-pay | Admitting: Internal Medicine

## 2020-06-11 DIAGNOSIS — R1311 Dysphagia, oral phase: Secondary | ICD-10-CM

## 2020-06-17 ENCOUNTER — Other Ambulatory Visit: Payer: 59

## 2020-06-18 ENCOUNTER — Telehealth: Payer: Self-pay | Admitting: Internal Medicine

## 2020-06-18 NOTE — Telephone Encounter (Signed)
Patient requesting refill for medication Adderall XR 30mg  sent into his pharmacy [facility] [address] [place]. Dr. ARMINGHALL can you please advise. Thank you

## 2020-06-20 MED ORDER — AMPHETAMINE-DEXTROAMPHET ER 30 MG PO CP24: 30.0000 mg | ORAL_CAPSULE | Freq: Every day | ORAL | 0 refills | Status: DC | PRN

## 2020-06-20 NOTE — Telephone Encounter (Signed)
Adderall refilled

## 2020-06-22 NOTE — Telephone Encounter (Signed)
Spoke with patient to let him know Dr.Young sent in refill for Adderall XR 30mg  . Patient voice was understanding. Nothing else further needed.

## 2020-07-07 ENCOUNTER — Ambulatory Visit: Payer: 59 | Admitting: Internal Medicine

## 2020-07-08 ENCOUNTER — Other Ambulatory Visit: Payer: 59

## 2020-07-12 ENCOUNTER — Other Ambulatory Visit: Payer: Self-pay

## 2020-07-12 ENCOUNTER — Encounter: Payer: Self-pay | Admitting: Student

## 2020-07-12 ENCOUNTER — Ambulatory Visit: Payer: 59 | Admitting: Student

## 2020-07-12 VITALS — BP 130/80 | HR 69 | Temp 97.5°F | Ht 69.0 in | Wt 304.0 lb

## 2020-07-12 DIAGNOSIS — Z720 Tobacco use: Secondary | ICD-10-CM

## 2020-07-12 DIAGNOSIS — I639 Cerebral infarction, unspecified: Secondary | ICD-10-CM | POA: Diagnosis not present

## 2020-07-12 DIAGNOSIS — E1165 Type 2 diabetes mellitus with hyperglycemia: Secondary | ICD-10-CM | POA: Diagnosis not present

## 2020-07-12 DIAGNOSIS — I69854 Hemiplegia and hemiparesis following other cerebrovascular disease affecting left non-dominant side: Secondary | ICD-10-CM

## 2020-07-12 DIAGNOSIS — F17210 Nicotine dependence, cigarettes, uncomplicated: Secondary | ICD-10-CM | POA: Diagnosis not present

## 2020-07-12 DIAGNOSIS — G47411 Narcolepsy with cataplexy: Secondary | ICD-10-CM

## 2020-07-12 DIAGNOSIS — Z6841 Body Mass Index (BMI) 40.0 and over, adult: Secondary | ICD-10-CM

## 2020-07-12 DIAGNOSIS — I480 Paroxysmal atrial fibrillation: Secondary | ICD-10-CM

## 2020-07-12 DIAGNOSIS — F17200 Nicotine dependence, unspecified, uncomplicated: Secondary | ICD-10-CM

## 2020-07-12 DIAGNOSIS — I119 Hypertensive heart disease without heart failure: Secondary | ICD-10-CM

## 2020-07-12 DIAGNOSIS — I69822 Dysarthria following other cerebrovascular disease: Secondary | ICD-10-CM

## 2020-07-12 MED ORDER — TRULICITY 0.75 MG/0.5ML ~~LOC~~ SOAJ: 0.7500 mg | SUBCUTANEOUS | 5 refills | Status: DC

## 2020-07-12 MED ORDER — NICOTINE 21 MG/24HR TD PT24: 21.0000 mg | MEDICATED_PATCH | TRANSDERMAL | 0 refills | Status: AC

## 2020-07-12 NOTE — Progress Notes (Signed)
Internal Medicine Clinic Attending  I saw and evaluated the patient.  I personally confirmed the key portions of the history and exam documented by Dr. Nguyen and I reviewed pertinent patient test results.  The assessment, diagnosis, and plan were formulated together and I agree with the documentation in the resident's note.\  

## 2020-07-12 NOTE — Assessment & Plan Note (Signed)
Patient was hospitalized on 06/01/2020 for an acute left caudal infarct.  MRI and echocardiogram was normal.  This likely lacunar infarct secondary to hypertension.  Patient reports inconsistency with his medications before hospitalization.  He was discharged with addition of atorvastatin 40 mg, no antiplatelets was added.  Patient still complaining of residual weakness of left upper and lower extremity and mild dysarthria.  Patient has had physical therapy inpatient and recommended no follow-up.  However with his weakness and dysarthria, it is reasonable to send him to physical therapy and speech therapy.  Patient already has an appointment with neurologist to follow-up.  Patient also states that he smoked 1 pack a day for 20 years.  I explained to him that smoking is another risk factor for CVA.  Advised patient to cut back on the cigarettes and I also prescribe nicotine patch.  Patient's said that he is determined to quit.  Goal is half a pack a day by next visit.  Plan: -Continue diabetes and hypertension management -Smoking cessation education provided with nicotine patch -Follow-up with neurologist -Continue taking atorvastatin 40 mg -Referrals to physical therapy and speech therapy

## 2020-07-12 NOTE — Assessment & Plan Note (Signed)
Patient states that he has not been trying to lose weight.  Patient is advised that weight loss would be beneficial for his diabetes and hypertension.  We will start GLP-1 for diabetes and weight loss benefits.    Plan: -Start Trulicity -Continue to encourage patient on lifestyle modification

## 2020-07-12 NOTE — Assessment & Plan Note (Addendum)
Blood pressure rechecked with the right size cuff is 130/80.  No change in blood pressure medication today.  Patient is advised to get a blood pressure cuff and start measuring blood pressure at home and keep a log.  He is advised to follow-up with his cardiologist.  Plan: -Lisinopril 40 mg -Metoprolol succinate 50 mg

## 2020-07-12 NOTE — Assessment & Plan Note (Signed)
Currently rate control.  Patient is advised to follow-up with his cardiologist.  Emphasized on the importance of medication adherence especially the Xarelto as it is a risk for embolic stroke  Plan: -Continue metoprolol succinate and flecainide -Diltiazem as needed -Anticoagulation with Xarelto

## 2020-07-12 NOTE — Assessment & Plan Note (Signed)
Patient smoked 1 pack a day for 20 years.  Plan: -Smoking cessation education provided -Nicotine patch

## 2020-07-12 NOTE — Assessment & Plan Note (Signed)
A1c 3 years ago was 14 and last month was 9.9.  Patient reports being inconsistent with his medications before hospitalization.  Patient denies history of DKA.  He said that he does check his sugar at home.  Patient declines insulin at this time and would like to try other regimen.  Due to his high A1c and obesity, will start Trulicity weekly injection.  Patient is instructed to continue Metformin and glipizide.  He is advised to continue checking blood sugar and bring his glucometer to the clinic at his next visit.  He states that he just had an eye exam in April of this year.  He is seeing a podiatrist for his diabetic foot management.  Plan: -Continue Metformin and glipizide -Start Trulicity -Patient will come back in 1 month to reassess blood sugar.  -Referral to Lupita Leash may be helpful if he has issue with injections.

## 2020-07-12 NOTE — Patient Instructions (Addendum)
[name],  It is a pleasure getting to know you today.  Here is a summary of what we talked about:  1.  Stroke: Please follow-up with your neurologist.  I will send a referral for physical therapy and speech therapy to hopefully help you regain strength.  Please continue taking all your medications as instructed.  2.  Diabetes: Your A1c was 9.9 while checked in the hospital.  I will add a medication called Trulicity, please give you 7 injection once a week.  Please let us know if you have questions about the injection.  Continue taking Metformin and glipizide as instructed.  3.  Hypertension: Your pressure today is 130/80.  We would like to keep your blood pressure below 130/80.  Please continue lisinopril and metoprolol.  4.  Atrial fibrillation: Please continue taking metoprolol and Xarelto as instructed.  5.  Smoking: Please try to cut back on your smoking.  I also sent a prescription for nicotine patch, please use 1 patch a day for 6 weeks.  Our goal is cut down to half pack a day by the next visit.  Please let us know if you have any questions or concerns.  Please come back in 1 week for blood sugar reassessment.  Take care  Dr. Cyndie Chime

## 2020-07-12 NOTE — Assessment & Plan Note (Signed)
Patient is using a CPAP for his OSA.  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  He is on Adderall for the narcolepsy.  Plan: -Continue follow-up with pulmonology

## 2020-07-12 NOTE — Progress Notes (Signed)
CC: Diabetes management.  HPI:  [name] is a 49 y.o. with past medical history of type 2 diabetes, hypertension, atrial fibrillation, OSA, narcolepsy, hyperlipidemia, sarcoidosis, who is here to establish care with the [facility] for diabetes and hypertension management.  Patient was hospitalized in 06/01/2020 for acute left caudate infarct evidence on MRI.  MRA and echocardiogram were normal.  Given the location, this may be a lacunar infarct due to hypertension.  Patient states that he has not been compliant with his medications before the hospitalization.  He complains of residual left upper and lower extremity weakness with mild dysarthria.  Patient has a follow-up appointment with neurology in October.  Patient states that he is compliant with his medication after discharge.  Patient has been seen primary care physician in [place] who has been managing his diabetes.  His A1c was 14 3 years ago and 9.9 last month.  He is unsure that he has been on insulin.  He is currently taking glipizide and Metformin.  He complains of numbness and tingling sensation of bilateral feet.  He is seeing a podiatrist for his diabetic foot management.  He denies wound or ulcers of bilateral feet.    Patient states that he is smoking 1 pack a day for 20 years.  He is states that he is ready to quit.  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  He has tried a few new jobs but unsuccessful.  Social history: Smoking: 1 pack a day for 20 years Alcohol: Liquor occasionally Drug: No  Family history History of diabetes, hypertension, stroke on both sides of family  Surgical history Ablation for atrial fibrillation Appendectomy Tonsillectomy Right rotator cuff tear repair  Past Medical History:  Diagnosis Date  . Diabetes mellitus   . GERD (gastroesophageal reflux disease)    . History of alcohol abuse   . History of cocaine use   . History of echocardiogram    a. Echo 4/14: Moderate LVH, vigorous LVEF, EF 65-70%, normal wall motion, grade 2 diastolic dysfunction, mildly dilated aortic root and ascending aorta, ascending aorta 40 mm, aortic root 38 mm, mild LAE  . Hx of cardiovascular stress test    a. GXT 5/14: No ischemic changes  //  b. ETT-Myoview 3/16:  Low risk, no ischemia, EF 58%  . Hyperlipidemia   . Hypertension   . Morbid obesity (HCC)   . Paroxysmal atrial fibrillation (HCC)    Occurring in 2008, with several recurrence since then (including in the setting of + cocaine on UDS).  . Sleep apnea    uses CPAP  . SVT (supraventricular tachycardia) (HCC)    mid to long RP SVT 09/2019   Review of Systems:   Review of Systems  HENT: Negative.   Eyes:       Black spots in his visual field  Gastrointestinal: Negative for abdominal pain, constipation and diarrhea.  Genitourinary: Positive for frequency.  Musculoskeletal:       Left upper and lower extremity weakness  Skin: Negative.   Neurological: Positive for tingling, sensory change, speech change and focal weakness.  Psychiatric/Behavioral: Positive for depression.    Physical Exam:  Vitals:   07/12/20 1048  BP: (!) 154/90  Pulse: 69  Temp: (!) 97.5 F (36.4 C)  TempSrc: Oral  SpO2: 99%  Weight: (!) 304 lb (137.9 kg)  Height: 5\' 9"  (  1.753 m)   Physical Exam Constitutional:      General: He is not in acute distress.    Appearance: He is obese.  HENT:     Head: Normocephalic.  Eyes:     General:        Right eye: No discharge.        Left eye: No discharge.  Cardiovascular:     Rate and Rhythm: Normal rate and regular rhythm.     Heart sounds: Murmur heard.   Pulmonary:     Effort: No respiratory distress.     Breath sounds: Normal breath sounds. No wheezing.  Abdominal:     General: Bowel sounds are normal.  Musculoskeletal:     Cervical back: Normal range of motion.      Right lower leg: No edema.     Left lower leg: No edema.     Comments: +2 pulses bilateral feet No obvious open wound ulcers Dry flaky skin  Skin:    General: Skin is warm.  Neurological:     Mental Status: He is alert.     Comments: Normal sensation bilaterally 5/5 strength right upper and lower extremity 4/5 strength left upper and lower extremity     Assessment & Plan:   See Encounters Tab for problem based charting.  Patient seen with Dr. Mayford Knife

## 2020-07-13 LAB — MICROALBUMIN / CREATININE URINE RATIO
Creatinine, Urine: 148.1 mg/dL
Microalb/Creat Ratio: 887 mg/g creat — ABNORMAL HIGH (ref 0–29)
Microalbumin, Urine: 1313.7 ug/mL

## 2020-07-14 ENCOUNTER — Inpatient Hospital Stay: Payer: 59 | Admitting: Adult Health

## 2020-07-14 NOTE — Progress Notes (Deleted)
[facility] [address] [place] [id number] [phone]       HOSPITAL FOLLOW UP NOTE  Mr. Douglas BandLeonidas Claude Schlossberg Jr. Date of Birth:  June 20, 1971 Medical Record Number:  [id number]   Reason for Referral:  hospital stroke follow up    SUBJECTIVE:   CHIEF COMPLAINT:  No chief complaint on file.   HPI:   [name] is a 49 y.o. male with history of diabetes, hypertension, morbid obesity, GERD, history of polysubstance abuse including cocaine and alcohol  who presented on 06/01/2020 with persistentdysarthric speech.  Stroke work-up revealed small L caudate body infarct embolic secondary to known AF source not consistently taking Xarelto.  Recommended continuation of Xarelto consistently for secondary stroke prevention.  HTN stable.  LDL 103 and increase atorvastatin to 40 mg daily.  Uncontrolled DM with A1c 9.9.  Other stroke risk factors include tobacco use, hx ETOH and substance abuse, obesity, family history of stroke and OSA on CPAP.  No prior personal history of stroke.  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 Acute infarct, prominent Small vessel disease and Atrophy.   MRI  Small L caudate body infarct. Small vessel disease. Atrophy.   MR CS Unremarkable   MRA head Unremarkable   MRA neck Unremarkable   2D Echo EF 60-65%. No source of embolus. LA mildly dilated  LDL 103  HgbA1c 9.9  VTE prophylaxis - SCDs    Xarelto (rivaroxaban) daily prior to admission, now on aspirin 325 mg daily. Not taking xarelto as prescribed - resume xarelto and stop aspirin   Therapy recommendations:  HH SLP  Disposition:  pending         ROS:   14 system review of systems performed and negative with exception of ***  PMH:  Past Medical History:  Diagnosis Date  . Diabetes mellitus   . GERD (gastroesophageal reflux  disease)   . History of alcohol abuse   . History of cocaine use   . History of echocardiogram    a. Echo 4/14: Moderate LVH, vigorous LVEF, EF 65-70%, normal wall motion, grade 2 diastolic dysfunction, mildly dilated aortic root and ascending aorta, ascending aorta 40 mm, aortic root 38 mm, mild LAE  . Hx of cardiovascular stress test    a. GXT 5/14: No ischemic changes  //  b. ETT-Myoview 3/16:  Low risk, no ischemia, EF 58%  . Hyperlipidemia   . Hypertension   . Morbid obesity (HCC)   . Paroxysmal atrial fibrillation (HCC)    Occurring in 2008, with several recurrence since then (including in the setting of + cocaine on UDS).  . Sleep apnea    uses CPAP  . SVT (supraventricular tachycardia) (HCC)    mid to long RP SVT 09/2019    PSH:  Past Surgical History:  Procedure Laterality Date  . APPENDECTOMY    . ATRIAL FIBRILLATION ABLATION N/A 11/27/2019   Procedure: ATRIAL FIBRILLATION ABLATION;  Surgeon: Hillis RangeAllred, James, MD;  Location: [facility] INVASIVE CV [facility];  Service: Cardiovascular;  Laterality: N/A;  . ROTATOR CUFF REPAIR    . SVT ABLATION N/A 11/27/2019   Procedure: SVT ABLATION;  Surgeon: Hillis RangeAllred, James, MD;  Location: [facility] INVASIVE CV LAB;  Service: Cardiovascular;  Laterality: N/A;  . TONSILLECTOMY      Social History:  Social History   Socioeconomic History  . Marital status: Single  Spouse name: Not on file  . Number of children: 0  . Years of education: 81  . Highest education level: Not on file  Occupational History  . Occupation: Designer, industrial/product: UNEMPLOYED  Tobacco Use  . Smoking status: Current Every Day Smoker    Packs/day: 0.50    Years: 28.00    Pack years: 14.00    Types: Cigarettes  . Smokeless tobacco: Former Neurosurgeon    Types: Snuff  . Tobacco comment: smoking off and on, less than a pack per day.  Vaping Use  . Vaping Use: Never used  Substance and Sexual Activity  . Alcohol use: Not Currently    Alcohol/week: 6.0 standard drinks    Types: 6  Standard drinks or equivalent per week    Comment: monthly  . Drug use: Not Currently    Comment: cocaine in the past, none currently  . Sexual activity: Not on file  Other Topics Concern  . Not on file  Social History Narrative   Fun: Photographer       Lives in [place] with sister.      Unemployed, was working a Office manager job   Social Determinants of Corporate investment banker Strain:   . Difficulty of Paying Living Expenses: Not on file  Food Insecurity:   . Worried About Programme researcher, broadcasting/film/video in the Last Year: Not on file  . Ran Out of Food in the Last Year: Not on file  Transportation Needs:   . Lack of Transportation (Medical): Not on file  . Lack of Transportation (Non-Medical): Not on file  Physical Activity:   . Days of Exercise per Week: Not on file  . Minutes of Exercise per Session: Not on file  Stress:   . Feeling of Stress : Not on file  Social Connections:   . Frequency of Communication with Friends and Family: Not on file  . Frequency of Social Gatherings with Friends and Family: Not on file  . Attends Religious Services: Not on file  . Active Member of Clubs or Organizations: Not on file  . Attends Banker Meetings: Not on file  . Marital Status: Not on file  Intimate Partner Violence:   . Fear of Current or Ex-Partner: Not on file  . Emotionally Abused: Not on file  . Physically Abused: Not on file  . Sexually Abused: Not on file    Family History:  Family History  Problem Relation Age of Onset  . Diabetes Mother   . Heart attack Father 65  . Stroke Maternal Grandmother   . Stroke Paternal Grandmother   . Diabetes Paternal Grandfather     Medications:   Current Outpatient Medications on File Prior to Visit  Medication Sig Dispense Refill  . amphetamine-dextroamphetamine (ADDERALL XR) 30 MG 24 hr capsule Take 1 capsule (30 mg total) by mouth daily as needed (focus). 1 daily as needed 30 capsule 0  . atorvastatin (LIPITOR) 40 MG  tablet Take 1 tablet (40 mg total) by mouth daily. 30 tablet 1  . Blood Pressure Monitoring (BLOOD PRESSURE MONITOR/L CUFF) MISC 1 Units by Does not apply route daily. 1 each 0  . diltiazem (CARDIZEM) 30 MG tablet Take 1 Tablet Every 4 Hours As Needed For HR >100 (Patient taking differently: Take 30 mg by mouth every 4 (four) hours as needed. For HR >100) 45 tablet 2  . Dulaglutide (TRULICITY) 0.75 MG/0.5ML SOPN Inject 0.75 mg into the skin once a week.  2 mL 5  . flecainide (TAMBOCOR) 100 MG tablet Take 1 tablet (100 mg total) by mouth 2 (two) times daily. 180 tablet 3  . glipiZIDE (GLUCOTROL) 10 MG tablet Take 10 mg by mouth 2 (two) times daily.    Marland Kitchen lisinopril (ZESTRIL) 40 MG tablet Take 40 mg by mouth daily.    . metFORMIN (GLUCOPHAGE) 500 MG tablet Take 1,000 mg by mouth 2 (two) times daily with a meal.     . metoprolol succinate (TOPROL-XL) 50 MG 24 hr tablet Take 1 tablet (50 mg total) by mouth daily. Take with or immediately following a meal. 90 tablet 3  . nicotine (NICODERM CQ - DOSED IN MG/24 HOURS) 21 mg/24hr patch Place 1 patch (21 mg total) onto the skin daily. 42 patch 0  . ondansetron (ZOFRAN ODT) 8 MG disintegrating tablet Take 1 tablet (8 mg total) by mouth every 8 (eight) hours as needed for nausea or vomiting. (Patient not taking: Reported on 06/01/2020) 12 tablet 0  . pantoprazole (PROTONIX) 40 MG tablet TAKE 1 TABLET BY MOUTH EVERY DAY (Patient taking differently: Take 40 mg by mouth daily. ) 30 tablet 5  . XARELTO 20 MG TABS tablet TAKE 1 TABLET (20 MG TOTAL) BY MOUTH DAILY WITH SUPPER. (Patient taking differently: Take 20 mg by mouth daily with supper. ) 30 tablet 6   No current facility-administered medications on file prior to visit.    Allergies:   Allergies  Allergen Reactions  . Shrimp [Shellfish Allergy] Anaphylaxis  . Banana Other (See Comments)    Pt gags  . Watermelon Flavor Nausea And Vomiting  . Peanut-Containing Drug Products Itching and Cough  . Almond Oil  Itching    Roof of mouth itches  . Sulfa Antibiotics Itching      OBJECTIVE:  Physical Exam  There were no vitals filed for this visit. There is no height or weight on file to calculate BMI. No exam data present  Depression screen [facility] 2/9 07/12/2020  Decreased Interest 3  Down, Depressed, Hopeless 3  PHQ - 2 Score 6  Altered sleeping 3  Tired, decreased energy 3  Change in appetite 3  Feeling bad or failure about yourself  3  Trouble concentrating 3  Moving slowly or fidgety/restless 3  Suicidal thoughts 0  PHQ-9 Score 24  Difficult doing work/chores Very difficult     General: well developed, well nourished, seated, in no evident distress Head: head normocephalic and atraumatic.   Neck: supple with no carotid or supraclavicular bruits Cardiovascular: regular rate and rhythm, no murmurs Musculoskeletal: no deformity Skin:  no rash/petichiae Vascular:  Normal pulses all extremities   Neurologic Exam Mental Status: Awake and fully alert. Oriented to place and time. Recent and remote memory intact. Attention span, concentration and fund of knowledge appropriate. Mood and affect appropriate.  Cranial Nerves: Fundoscopic exam reveals sharp disc margins. Pupils equal, briskly reactive to light. Extraocular movements full without nystagmus. Visual fields full to confrontation. Hearing intact. Facial sensation intact. Face, tongue, palate moves normally and symmetrically.  Motor: Normal bulk and tone. Normal strength in all tested extremity muscles. Sensory.: intact to touch , pinprick , position and vibratory sensation.  Coordination: Rapid alternating movements normal in all extremities. Finger-to-nose and heel-to-shin performed accurately bilaterally. Gait and Station: Arises from chair without difficulty. Stance is normal. Gait demonstrates normal stride length and balance Reflexes: 1+ and symmetric. Toes downgoing.     NIHSS  *** Modified Rankin  *** CHA2DS2-VASc  4 HAS-BLED 1  ASSESSMENT: [name]. is a 49 y.o. year old male presented with persistent dysarthric speech on 06/01/2020 with stroke work-up revealing small L caudate body infarct embolic secondary to known AF source not consistently taking Xarelto. Vascular risk factors include A. Fib, HTN, HLD, DM, OSA on CPAP, morbid obesity, current tobacco use and history of polysubstance and EtOH use.      PLAN:  1. L caudate head infarct : Residual deficit: ***. Continue Xarelto (rivaroxaban) daily  and atorvastatin 40 mg daily for secondary stroke prevention. Close PCP follow up for aggressive stroke risk factor management  2. Atrial fibrillation: Discussed importance of daily use of Xarelto for secondary stroke prevention.  Continue to follow with cardiology for monitoring and management. 3. HTN: BP goal <130/90. Continue f/u with PCP 4. HLD: LDL goal <70. Recent LDL 103.  Continue atorvastatin.  F/u with PCP for management as well as prescribing of statin 5. DMII: A1c goal<7.0. Recent A1c 9.9. F/u with PCP    Follow up in *** or call earlier if needed   I spent *** minutes of face-to-face and non-face-to-face time with patient.  This included previsit chart review, lab review, study review, order entry, electronic health record documentation, patient education regarding recent stroke, residual deficits, importance of managing stroke risk factors and answered all questions to patient satisfaction     Ihor Austin, [facility]  [facility] [address] [place], [place] [address]  Phone [phone] Fax [phone] Note: This document was prepared with digital dictation and possible smart phrase technology. Any transcriptional errors that result from this process are unintentional.

## 2020-07-19 ENCOUNTER — Telehealth: Payer: Self-pay | Admitting: Internal Medicine

## 2020-07-19 MED ORDER — AMPHETAMINE-DEXTROAMPHET ER 30 MG PO CP24: 30.0000 mg | ORAL_CAPSULE | Freq: Every day | ORAL | 0 refills | Status: DC | PRN

## 2020-07-19 NOTE — Telephone Encounter (Signed)
Please advise- pt needing refill on adderall 30 mg  Last given # 30 tablets 06/20/20  I verified that his preferred pharm is [facility] on [address]

## 2020-07-19 NOTE — Telephone Encounter (Signed)
Called and spoke with Patient.  Patient aware Dr Maple Hudson sent requested Adderall prescription to pharmacy.  Nothing further at this time.

## 2020-07-19 NOTE — Telephone Encounter (Signed)
Adderall refil e-sent

## 2020-07-23 ENCOUNTER — Ambulatory Visit: Payer: 59 | Admitting: Dietician

## 2020-07-23 ENCOUNTER — Encounter: Payer: 59 | Attending: Internal Medicine | Admitting: Dietician

## 2020-07-26 ENCOUNTER — Encounter: Payer: Self-pay | Admitting: *Deleted

## 2020-08-07 ENCOUNTER — Other Ambulatory Visit: Payer: Self-pay | Admitting: Internal Medicine

## 2020-08-08 ENCOUNTER — Encounter: Payer: Self-pay | Admitting: Internal Medicine

## 2020-08-09 ENCOUNTER — Ambulatory Visit: Payer: 59 | Admitting: Adult Health

## 2020-08-09 ENCOUNTER — Other Ambulatory Visit: Payer: Self-pay

## 2020-08-09 ENCOUNTER — Encounter: Payer: Self-pay | Admitting: Adult Health

## 2020-08-09 ENCOUNTER — Encounter: Payer: 59 | Admitting: Student

## 2020-08-09 VITALS — BP 127/77 | HR 78 | Ht 69.0 in | Wt 301.0 lb

## 2020-08-09 DIAGNOSIS — R41842 Visuospatial deficit: Secondary | ICD-10-CM

## 2020-08-09 DIAGNOSIS — I69319 Unspecified symptoms and signs involving cognitive functions following cerebral infarction: Secondary | ICD-10-CM

## 2020-08-09 DIAGNOSIS — I10 Essential (primary) hypertension: Secondary | ICD-10-CM | POA: Diagnosis not present

## 2020-08-09 DIAGNOSIS — R2689 Other abnormalities of gait and mobility: Secondary | ICD-10-CM

## 2020-08-09 DIAGNOSIS — E785 Hyperlipidemia, unspecified: Secondary | ICD-10-CM | POA: Diagnosis not present

## 2020-08-09 DIAGNOSIS — H538 Other visual disturbances: Secondary | ICD-10-CM

## 2020-08-09 DIAGNOSIS — I480 Paroxysmal atrial fibrillation: Secondary | ICD-10-CM

## 2020-08-09 DIAGNOSIS — I639 Cerebral infarction, unspecified: Secondary | ICD-10-CM | POA: Diagnosis not present

## 2020-08-09 DIAGNOSIS — G4733 Obstructive sleep apnea (adult) (pediatric): Secondary | ICD-10-CM

## 2020-08-09 DIAGNOSIS — I69322 Dysarthria following cerebral infarction: Secondary | ICD-10-CM

## 2020-08-09 NOTE — Telephone Encounter (Signed)
Pt's pharmacy is requesting a refill on pantoprazole. Would Dr. Allred like to refill this medication? Please address 

## 2020-08-09 NOTE — Patient Instructions (Signed)
Referral placed to physical, occupational, and speech therapy   Continue to follow with pulmonology for sleep apnea and narcolepsy management   Continue Xarelto (rivaroxaban) daily  and atrovastatin  for secondary stroke prevention  Continue to follow up with PCP regarding cholesterol, blood pressure and diabetes management  Maintain strict control of hypertension with blood pressure goal below 130/90, diabetes with hemoglobin A1c goal below 7% and cholesterol with LDL cholesterol (bad cholesterol) goal below 70 mg/dL.     Followup in the future with me in 3 months or call earlier if needed     Thank you for coming to see [place] at [facility]. I hope we have been able to provide you high quality care today.  You may receive a patient satisfaction survey over the next few weeks. We would appreciate your feedback and comments so that we may continue to improve ourselves and the health of our patients.    Stroke Prevention Some medical conditions and behaviors are associated with a higher chance of having a stroke. You can help prevent a stroke by making nutrition, lifestyle, and other changes, including managing any medical conditions you may have. What nutrition changes can be made?   Eat healthy foods. You can do this by: ? Choosing foods high in fiber, such as fresh fruits and vegetables and whole grains. ? Eating at least 5 or more servings of fruits and vegetables a day. Try to fill half of your plate at each meal with fruits and vegetables. ? Choosing lean protein foods, such as lean cuts of meat, poultry without skin, fish, tofu, beans, and nuts. ? Eating low-fat dairy products. ? Avoiding foods that are high in salt (sodium). This can help lower blood pressure. ? Avoiding foods that have saturated fat, trans fat, and cholesterol. This can help prevent high cholesterol. ? Avoiding processed and premade foods.  Follow your health care provider's specific  guidelines for losing weight, controlling high blood pressure (hypertension), lowering high cholesterol, and managing diabetes. These may include: ? Reducing your daily calorie intake. ? Limiting your daily sodium intake to 1,500 milligrams (mg). ? Using only healthy fats for cooking, such as olive oil, canola oil, or sunflower oil. ? Counting your daily carbohydrate intake. What lifestyle changes can be made?  Maintain a healthy weight. Talk to your health care provider about your ideal weight.  Get at least 30 minutes of moderate physical activity at least 5 days a week. Moderate activity includes brisk walking, biking, and swimming.  Do not use any products that contain nicotine or tobacco, such as cigarettes and e-cigarettes. If you need help quitting, ask your health care provider. It may also be helpful to avoid exposure to secondhand smoke.  Limit alcohol intake to no more than 1 drink a day for nonpregnant women and 2 drinks a day for men. One drink equals 12 oz of beer, 5 oz of wine, or 1 oz of hard liquor.  Stop any illegal drug use.  Avoid taking birth control pills. Talk to your health care provider about the risks of taking birth control pills if: ? You are over 74 years old. ? You smoke. ? You get migraines. ? You have ever had a blood clot. What other changes can be made?  Manage your cholesterol levels. ? Eating a healthy diet is important for preventing high cholesterol. If cholesterol cannot be managed through diet alone, you may also need to take medicines. ? Take any prescribed medicines to control your  cholesterol as told by your health care provider.  Manage your diabetes. ? Eating a healthy diet and exercising regularly are important parts of managing your blood sugar. If your blood sugar cannot be managed through diet and exercise, you may need to take medicines. ? Take any prescribed medicines to control your diabetes as told by your health care  provider.  Control your hypertension. ? To reduce your risk of stroke, try to keep your blood pressure below 130/80. ? Eating a healthy diet and exercising regularly are an important part of controlling your blood pressure. If your blood pressure cannot be managed through diet and exercise, you may need to take medicines. ? Take any prescribed medicines to control hypertension as told by your health care provider. ? Ask your health care provider if you should monitor your blood pressure at home. ? Have your blood pressure checked every year, even if your blood pressure is normal. Blood pressure increases with age and some medical conditions.  Get evaluated for sleep disorders (sleep apnea). Talk to your health care provider about getting a sleep evaluation if you snore a lot or have excessive sleepiness.  Take over-the-counter and prescription medicines only as told by your health care provider. Aspirin or blood thinners (antiplatelets or anticoagulants) may be recommended to reduce your risk of forming blood clots that can lead to stroke.  Make sure that any other medical conditions you have, such as atrial fibrillation or atherosclerosis, are managed. What are the warning signs of a stroke? The warning signs of a stroke can be easily remembered as BEFAST.  B is for balance. Signs include: ? Dizziness. ? Loss of balance or coordination. ? Sudden trouble walking.  E is for eyes. Signs include: ? A sudden change in vision. ? Trouble seeing.  F is for face. Signs include: ? Sudden weakness or numbness of the face. ? The face or eyelid drooping to one side.  A is for arms. Signs include: ? Sudden weakness or numbness of the arm, usually on one side of the body.  S is for speech. Signs include: ? Trouble speaking (aphasia). ? Trouble understanding.  T is for time. ? These symptoms may represent a serious problem that is an emergency. Do not wait to see if the symptoms will go away.  Get medical help right away. Call your local emergency services (911 in the [place] Do not drive yourself to the hospital.  Other signs of stroke may include: ? A sudden, severe headache with no known cause. ? Nausea or vomiting. ? Seizure. Where to find more information For more information, visit:  American Stroke Association: www.strokeassociation.org  National Stroke Association: www.stroke.org Summary  You can prevent a stroke by eating healthy, exercising, not smoking, limiting alcohol intake, and managing any medical conditions you may have.  Do not use any products that contain nicotine or tobacco, such as cigarettes and e-cigarettes. If you need help quitting, ask your health care provider. It may also be helpful to avoid exposure to secondhand smoke.  Remember BEFAST for warning signs of stroke. Get help right away if you or a loved one has any of these signs. This information is not intended to replace advice given to you by your health care provider. Make sure you discuss any questions you have with your health care provider. Document Revised: 09/14/2017 Document Reviewed: 11/07/2016 Elsevier Patient Education  2020 [employer].

## 2020-08-09 NOTE — Progress Notes (Signed)
[facility] [address] [place]. [place] [address] [phone]       HOSPITAL FOLLOW UP NOTE  [name]. Date of Birth:  13-Nov-1970 Medical Record Number:  [id number]   Reason for Referral:  hospital stroke follow up    SUBJECTIVE:   CHIEF COMPLAINT:  Chief Complaint  Patient presents with  . Hospitalization Follow-up    rm 9  . Cerebrovascular Accident    HPI:   [name] is a 49 y.o. male with history of atrial fibrillation, OSA on CPAP, narcolepsy, diabetes, hypertension, morbid obesity, GERD, history of polysubstance abuse including cocaine and alcohol  who presented on 06/01/2020 with persistentdysarthric speech.  Stroke work-up revealed small L caudate body infarct embolic secondary to known AF source not consistently taking Xarelto.  Recommended continuation of Xarelto consistently for secondary stroke prevention.  HTN stable.  LDL 103 and increase atorvastatin to 40 mg daily.  Uncontrolled DM with A1c 9.9.  Other stroke risk factors include tobacco use, hx ETOH and substance abuse, obesity, family history of stroke and OSA on CPAP.  No prior personal history of stroke.  Evaluated by therapy without PT/OT needs but recommended outpatient SLP for residual cognitive impairment and mild to moderate dysarthria  Stroke:   Small L caudate body infarct embolic secondary to known AF source not consistently taking Xarelto  CT head focal hyperdensities B corona radiata ? Acute infarct, prominent Small vessel disease and Atrophy.   MRI  Small L caudate body infarct. Small vessel disease. Atrophy.   MR CS Unremarkable   MRA head Unremarkable   MRA neck Unremarkable   2D Echo EF 60-65%. No source of embolus. LA mildly dilated  LDL 103  HgbA1c 9.9  VTE prophylaxis - SCDs    Xarelto (rivaroxaban) daily prior to admission, now on aspirin 325 mg daily. Not taking xarelto as prescribed - resume xarelto and stop aspirin    Therapy recommendations:  HH SLP  Disposition:  home  Today, 08/09/2020, [name] is being seen for hospital follow-up unaccompanied.  Reports residual mild to moderate speech difficulty, short-term memory impairment, imbalance and visual impairment. Denies residual right sided weakness.  He has not worked with any therapy but PCP placed referral to initiate physical and speech therapy but he has not scheduled visits at this time.  Reports worsening vision post stroke with underlying history of diabetic retinopathy.  Denies new or worsening stroke/TIA symptoms.  He reports ongoing compliance with Xarelto and denies bleeding or bruising.  Reports compliance with atorvastatin and denies myalgias.  Blood pressure today satisfactory at 127/77.  He does not routinely monitor at home.  Glucose level stable per patient.  He does report excessive daytime fatigue but has been noncompliant with CPAP over the past couple months due to increased anxiety and "fear of choking".  He has not further discuss this with his pulmonologist.  Prescribed Adderall for history of narcolepsy but he has not taken since July as he has not been working although per epic reviewed, pulmonologist recently refilled Adderall prescription.  No further concerns at this time.     ROS:   14 system review of systems performed and negative with exception of those listed in HPI  PMH:  Past Medical History:  Diagnosis Date  . Diabetes mellitus   . GERD (gastroesophageal reflux disease)   . History of alcohol abuse   . History of cocaine use   . History of echocardiogram    a.  Echo 4/14: Moderate LVH, vigorous LVEF, EF 65-70%, normal wall motion, grade 2 diastolic dysfunction, mildly dilated aortic root and ascending aorta, ascending aorta 40 mm, aortic root 38 mm, mild LAE  . Hx of cardiovascular stress test    a. GXT 5/14: No ischemic changes  //  b. ETT-Myoview 3/16:  Low risk, no ischemia, EF 58%  . Hyperlipidemia   .  Hypertension   . Morbid obesity (HCC)   . Paroxysmal atrial fibrillation (HCC)    Occurring in 2008, with several recurrence since then (including in the setting of + cocaine on UDS).  . Sleep apnea    uses CPAP  . SVT (supraventricular tachycardia) (HCC)    mid to long RP SVT 09/2019    PSH:  Past Surgical History:  Procedure Laterality Date  . APPENDECTOMY    . ATRIAL FIBRILLATION ABLATION N/A 11/27/2019   Procedure: ATRIAL FIBRILLATION ABLATION;  Surgeon: [name], MD;  Location: [facility] INVASIVE [facility];  Service: Cardiovascular;  Laterality: N/A;  . ROTATOR CUFF REPAIR    . SVT ABLATION N/A 11/27/2019   Procedure: SVT ABLATION;  Surgeon: [name], MD;  Location: [facility] INVASIVE CV LAB;  Service: Cardiovascular;  Laterality: N/A;  . TONSILLECTOMY      Social History:  Social History   Socioeconomic History  . Marital status: Single    Spouse name: Not on file  . Number of children: 0  . Years of education: 47  . Highest education level: Not on file  Occupational History  . Occupation: Designer, industrial/product: UNEMPLOYED  Tobacco Use  . Smoking status: Current Every Day Smoker    Packs/day: 0.50    Years: 28.00    Pack years: 14.00    Types: Cigarettes  . Smokeless tobacco: Former Neurosurgeon    Types: Snuff  . Tobacco comment: smoking off and on, less than a pack per day.  Vaping Use  . Vaping Use: Never used  Substance and Sexual Activity  . Alcohol use: Not Currently    Alcohol/week: 6.0 standard drinks    Types: 6 Standard drinks or equivalent per week    Comment: monthly  . Drug use: Not Currently    Comment: cocaine in the past, none currently  . Sexual activity: Not on file  Other Topics Concern  . Not on file  Social History Narrative   Fun: Photographer       Lives in [place] with sister.      Unemployed, was working a Office manager job   Social Determinants of Corporate investment banker Strain:   . Difficulty of Paying Living Expenses: Not on file   Food Insecurity:   . Worried About Programme researcher, broadcasting/film/video in the Last Year: Not on file  . Ran Out of Food in the Last Year: Not on file  Transportation Needs:   . Lack of Transportation (Medical): Not on file  . Lack of Transportation (Non-Medical): Not on file  Physical Activity:   . Days of Exercise per Week: Not on file  . Minutes of Exercise per Session: Not on file  Stress:   . Feeling of Stress : Not on file  Social Connections:   . Frequency of Communication with Friends and Family: Not on file  . Frequency of Social Gatherings with Friends and Family: Not on file  . Attends Religious Services: Not on file  . Active Member of Clubs or Organizations: Not on file  . Attends Banker  Meetings: Not on file  . Marital Status: Not on file  Intimate Partner Violence:   . Fear of Current or Ex-Partner: Not on file  . Emotionally Abused: Not on file  . Physically Abused: Not on file  . Sexually Abused: Not on file    Family History:  Family History  Problem Relation Age of Onset  . Diabetes Mother   . Heart attack Father 1337  . Stroke Maternal Grandmother   . Stroke Paternal Grandmother   . Diabetes Paternal Grandfather     Medications:   Current Outpatient Medications on File Prior to Visit  Medication Sig Dispense Refill  . amphetamine-dextroamphetamine (ADDERALL XR) 30 MG 24 hr capsule Take 1 capsule (30 mg total) by mouth daily as needed (focus). 1 daily as needed 30 capsule 0  . atorvastatin (LIPITOR) 40 MG tablet Take 1 tablet (40 mg total) by mouth daily. 30 tablet 1  . Blood Pressure Monitoring (BLOOD PRESSURE MONITOR/L CUFF) MISC 1 Units by Does not apply route daily. 1 each 0  . diltiazem (CARDIZEM) 30 MG tablet Take 1 Tablet Every 4 Hours As Needed For HR >100 (Patient taking differently: Take 30 mg by mouth every 4 (four) hours as needed. For HR >100) 45 tablet 2  . Dulaglutide (TRULICITY) 0.75 MG/0.5ML SOPN Inject 0.75 mg into the skin once a week. 2 mL  5  . flecainide (TAMBOCOR) 100 MG tablet Take 1 tablet (100 mg total) by mouth 2 (two) times daily. 180 tablet 3  . glipiZIDE (GLUCOTROL) 10 MG tablet Take 10 mg by mouth 2 (two) times daily.    Marland Kitchen. lisinopril (ZESTRIL) 40 MG tablet Take 40 mg by mouth daily.    . metFORMIN (GLUCOPHAGE) 500 MG tablet Take 1,000 mg by mouth 2 (two) times daily with a meal.     . nicotine (NICODERM CQ - DOSED IN MG/24 HOURS) 21 mg/24hr patch Place 1 patch (21 mg total) onto the skin daily. 42 patch 0  . ondansetron (ZOFRAN ODT) 8 MG disintegrating tablet Take 1 tablet (8 mg total) by mouth every 8 (eight) hours as needed for nausea or vomiting. 12 tablet 0  . pantoprazole (PROTONIX) 40 MG tablet TAKE 1 TABLET BY MOUTH EVERY DAY (Patient taking differently: Take 40 mg by mouth daily. ) 30 tablet 5  . XARELTO 20 MG TABS tablet TAKE 1 TABLET (20 MG TOTAL) BY MOUTH DAILY WITH SUPPER. (Patient taking differently: Take 20 mg by mouth daily with supper. ) 30 tablet 6  . metoprolol succinate (TOPROL-XL) 50 MG 24 hr tablet Take 1 tablet (50 mg total) by mouth daily. Take with or immediately following a meal. 90 tablet 3   No current facility-administered medications on file prior to visit.    Allergies:   Allergies  Allergen Reactions  . Shrimp [Shellfish Allergy] Anaphylaxis  . Banana Other (See Comments)    Pt gags  . Watermelon Flavor Nausea And Vomiting  . Peanut-Containing Drug Products Itching and Cough  . Almond Oil Itching    Roof of mouth itches  . Sulfa Antibiotics Itching      OBJECTIVE:  Physical Exam  Vitals:   08/09/20 1413  BP: 127/77  Pulse: 78  Weight: (!) 301 lb (136.5 kg)  Height: 5\' 9"  (1.753 m)   Body mass index is 44.45 kg/m. No exam data present  General: Morbidly obese pleasant middle-aged African-American male, seated, in no evident distress Head: head normocephalic and atraumatic.   Neck: supple with no carotid or  supraclavicular bruits Cardiovascular: regular rate and  rhythm, no murmurs Musculoskeletal: no deformity Skin:  no rash/petichiae Vascular:  Normal pulses all extremities   Neurologic Exam Mental Status: Awake and fully alert.   Mild dysarthria with mild baseline stuttering.  Oriented to place and time. Recent memory impaired and remote memory intact. Attention span, concentration and fund of knowledge appropriate during visit. Mood and affect flat.  Cranial Nerves: Fundoscopic exam reveals sharp disc margins. Pupils equal, briskly reactive to light. Extraocular movements full without nystagmus. Visual fields full to confrontation. Hearing intact. Facial sensation intact. Face, tongue, palate moves normally and symmetrically.  Motor: Normal bulk and tone. Normal strength in all tested extremity muscles. Sensory.: intact to touch , pinprick , position and vibratory sensation.  Coordination: Rapid alternating movements normal in all extremities. Finger-to-nose and heel-to-shin performed accurately bilaterally. Gait and Station: Arises from chair without difficulty. Stance is normal. Gait demonstrates normal stride length and mild unsteadiness without use of assistive device.  Unable to perform tandem walk.  Romberg testing not attempted. Reflexes: 1+ and symmetric. Toes downgoing.     NIHSS  1 Modified Rankin  2 CHA2DS2-VASc 4 HAS-BLED 1     ASSESSMENT: [name]. is a 49 y.o. year old male presented with persistent dysarthric speech on 06/01/2020 with stroke work-up revealing small L caudate body infarct embolic secondary to known AF source not consistently taking Xarelto. Vascular risk factors include A. Fib, HTN, HLD, DM, OSA on CPAP, morbid obesity, current tobacco use and history of polysubstance and EtOH use.      PLAN:  1. L caudate head infarct :  a. Residual deficit: Gait impairment with imbalance, dysarthria, subjective visual difficulty (?  Visual spatial impairment) and cognitive impairment.  i. Referral recently  placed to neuro rehab PT and SLP by PCP. Will also place referral for OT due to possible visual spatial impairment.  Personally assisted patient to neuro rehab office to ensure evaluations are scheduled. ii. He has not returned back to work at this time previously working at [employer] -advised to hold off on returning until further evaluation by therapies iii. Advised to follow-up with his ophthalmologist due to worsening visual concerns post stroke b. Continue Xarelto (rivaroxaban) daily  and atorvastatin 40 mg daily for secondary stroke prevention c. Discussed secondary stroke prevention measures and importance of close PCP follow up for aggressive stroke risk factor management  2. Atrial fibrillation:  a. CHA2DS2-VASc score 4 indicating use of AC for stroke prevention.   b. Discussed importance of daily use of Xarelto for secondary stroke prevention.   c. Continue to follow with cardiology for monitoring and management. 3. HTN: BP goal <130/90.  Stable today.  On lisinopril and Cardizem per cardiology 4. HLD: LDL goal <70. Recent LDL 103.  Continue atorvastatin.  F/u with PCP for management as well as prescribing of statin 5. DMII: A1c goal<7.0. Recent A1c 9.9. On Trulicity, glipizide and Metformin per PCP 6. OSA: Discussed importance of CPAP compliance for secondary stroke prevention and cardiovascular risk factors as well as noncompliance likely greatly contributing to increased daytime fatigue.  He reports increased anxiety with mask use and advised to further discuss with pulmonologist 7. Narcolepsy: Currently prescribed Adderall by pulmonology with recent refill but patient denies taking since August.  Unsure of accuracy but will notify pulmonology    Follow up in 3 months or call earlier if needed  CC:  [facility] provider: Dr. Rogers Blocker, MD   Jetty Duhamel, MD (pulmonology)  I spent 50 minutes of face-to-face and non-face-to-face time with patient.  This included previsit  chart review, lab review, study review, order entry, electronic health record documentation, patient education regarding recent stroke and etiology, residual deficits, importance of managing stroke risk factors and answered all questions to patient satisfaction   Ihor Austin, [facility]  [facility] [address] [place], [place] [address]  Phone [phone] Fax [phone] Note: This document was prepared with digital dictation and possible smart phrase technology. Any transcriptional errors that result from this process are unintentional.

## 2020-08-09 NOTE — Progress Notes (Signed)
[name] [name]. is a 49 y.o. male here for a hospital f/u. Pt said he is off balance and cant sleep at night. When calling the pt back he was completely a sleep in the waiting area. While walking the patient to the rm I noticed he was stumbling a lot.

## 2020-08-11 NOTE — Progress Notes (Deleted)
HPI  male smoker followed for management of OSA and narcolepsy with cataplexy, complicated by allergic rhinitis, atrial fibrillation, dCHF, DM, HTN, , Mediastinal and Hilar Adenopathy,  NPSG 11/23/00- AHI 20 per hour. Hypnagogic hallucination associated with dreaming. 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 Vivid dreams as soon as he falls asleep Multiple Sleep Latency Test 10/30/2012-pathologic daytime hypersomnia, nonspecific, compatible with idiopathic hypersomnia or narcolepsy. Mean latency 0.9 minutes with one sleep onset REM event  ---------------------------------------------------------------------------------------  05/10/20- 49 year old male Smoker followed for management of OSA and narcolepsy with cataplexy, complicated by allergic rhinitis, atrial fibrillation/Xarelto flecainide/ Xarelto, dCHF, Diastolic Dysfunction, CAD, DM2, HTN, Pancreatitis, Obesity, Tobacco use,  CPAP auto 4-17/ [employer]-                                                      Had 2 Phizer Covax Body weight today- 296 lbs ED visit for malaise, dyspnea, N&V on 05/04/20- SARs swab Neg, > Zofran, d ACE level <5 on 01/06/20 CXR 05/04/20- IMPRESSION: No acute process in the chest. There is bilateral hilar prominence likely reflecting adenopathy seen on prior chest CT. 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  Went to ER for malaise and dehydration. 3 days earlier 3/17 he had paronychia lanced R thumb and started doxycycline. Since then has continued malaise, some chilling, some sweats. Denies cough. UTI was questioned and Dr Nehemiah Settle put him on Cipro, started 2 days ago with none taken yet today. Thumb feels some better today.  08/12/20- 49 year old male Smoker followed for management of OSA and narcolepsy with cataplexy, complicated by allergic rhinitis, atrial fibrillation/Xarelto flecainide/ Xarelto, dCHF, Diastolic Dysfunction, CAD, DM2, HTN, Pancreatitis, Obesity,  Tobacco use, Mediastinal and Hilar Adenopathy, CVA,  Followed by Neurology after CVA 05/2020,  CPAP auto 4-17/ [employer]- Body weight today- Covid vax- Flu vax-   ROS-see HPI  + = positive Constitutional:   No-   weight loss, night sweats, fevers, +chills,  +fatigue, lassitude. HEENT:   No-  headaches, difficulty swallowing, tooth/dental problems, sore throat,       No-  sneezing, itching, ear ache, + nasal congestion, post nasal drip,  CV:  No-   chest pain, orthopnea, PND, swelling in lower extremities, anasarca, dizziness, palpitations Resp: No-   shortness of breath with exertion or at rest.              No-   productive cough,  No non-productive cough,  No- coughing up of blood.              No-   change in color of mucus.  No- wheezing.   Skin: No-   rash or lesions. GI:  No-   heartburn, indigestion, abdominal pain, nausea, vomiting,  GU:  MS:  No-   joint pain or swelling.  Neuro-     +HPI Psych:  No- change in mood or affect. No depression or anxiety.  No memory loss.  OBJ- Physical Exam   General- Alert/ calm, Oriented, Affect-appropriate, Distress- none acute, +obese,  Skin- rash-none, lesions- none, excoriation- none Lymphadenopathy- none Head- atraumatic            Eyes- Gross vision intact, PERRLA, conjunctivae and secretions clear            Ears- Hearing, canals-normal  Nose- rhinitis/ turbinate edema, no-Septal dev, mucus, polyps, erosion, perforation             Throat- Mallampati III , mucosa clear , drainage- none, tonsils- atrophic Neck- flexible , trachea midline, no stridor , thyroid nl, carotid no bruit Chest - symmetrical excursion , unlabored           Heart/CV- RRR , no murmur , no gallop  , no rub, nl s1 s2                           - JVD- none , edema- none, stasis changes- none, varices- none           Lung- clear to P&A, wheeze- none, cough- none , dullness-none, rub- none           Chest wall-  Abd-  Br/ Gen/ Rectal- Not done, not  indicated Extrem- cyanosis- + some thickening nail bed R thumb c/w resolving infection- not hot or red.

## 2020-08-11 NOTE — Progress Notes (Signed)
I agree with the above plan 

## 2020-08-12 ENCOUNTER — Ambulatory Visit: Payer: 59 | Admitting: Internal Medicine

## 2020-08-16 ENCOUNTER — Ambulatory Visit: Payer: 59 | Attending: Internal Medicine | Admitting: Rehabilitation

## 2020-08-16 ENCOUNTER — Telehealth: Payer: Self-pay | Admitting: Internal Medicine

## 2020-08-16 DIAGNOSIS — I69318 Other symptoms and signs involving cognitive functions following cerebral infarction: Secondary | ICD-10-CM | POA: Insufficient documentation

## 2020-08-16 DIAGNOSIS — R278 Other lack of coordination: Secondary | ICD-10-CM | POA: Insufficient documentation

## 2020-08-16 DIAGNOSIS — R41842 Visuospatial deficit: Secondary | ICD-10-CM | POA: Insufficient documentation

## 2020-08-16 DIAGNOSIS — R2689 Other abnormalities of gait and mobility: Secondary | ICD-10-CM | POA: Insufficient documentation

## 2020-08-16 DIAGNOSIS — R2681 Unsteadiness on feet: Secondary | ICD-10-CM | POA: Insufficient documentation

## 2020-08-16 MED ORDER — AMPHETAMINE-DEXTROAMPHET ER 30 MG PO CP24: 30.0000 mg | ORAL_CAPSULE | Freq: Every day | ORAL | 0 refills | Status: DC | PRN

## 2020-08-16 NOTE — Telephone Encounter (Signed)
Called and spoke to patient, who is requesting refill on Adderall 30mg . Last refilled 07/19/2020 #30 with 0 refills. Patient last seen 05/10/2020 with pending OV for 10/25/20.  Dr. 12/23/20, please advise. Thanks

## 2020-08-16 NOTE — Telephone Encounter (Signed)
Adderall refill e-sent 

## 2020-09-02 ENCOUNTER — Other Ambulatory Visit: Payer: Self-pay

## 2020-09-02 ENCOUNTER — Encounter: Payer: Self-pay | Admitting: Physical Therapy

## 2020-09-02 ENCOUNTER — Ambulatory Visit: Payer: 59 | Admitting: Physical Therapy

## 2020-09-02 VITALS — BP 154/88

## 2020-09-02 DIAGNOSIS — R278 Other lack of coordination: Secondary | ICD-10-CM | POA: Diagnosis not present

## 2020-09-02 DIAGNOSIS — I69318 Other symptoms and signs involving cognitive functions following cerebral infarction: Secondary | ICD-10-CM | POA: Diagnosis present

## 2020-09-02 DIAGNOSIS — R2689 Other abnormalities of gait and mobility: Secondary | ICD-10-CM | POA: Diagnosis present

## 2020-09-02 DIAGNOSIS — R2681 Unsteadiness on feet: Secondary | ICD-10-CM | POA: Diagnosis present

## 2020-09-02 DIAGNOSIS — R41842 Visuospatial deficit: Secondary | ICD-10-CM | POA: Diagnosis present

## 2020-09-02 NOTE — Therapy (Signed)
[facility] [address] [place], [place], [address] Phone: [phone]   Fax:  [phone]  Physical Therapy Evaluation  Patient Details  Name: [name]. MRN: [id number] Date of Birth: [birth year] Referring Provider (PT): Miguel Aschoff, MD   Encounter Date: 09/02/2020   PT End of Session - 09/02/20 1504    Visit Number 1    Number of Visits 13    Authorization Type UHC    PT Start Time 0935    PT Stop Time 1015    PT Time Calculation (min) 40 min    Activity Tolerance Patient tolerated treatment well    Behavior During Therapy [facility] for tasks assessed/performed           Past Medical History:  Diagnosis Date   Diabetes mellitus    GERD (gastroesophageal reflux disease)    History of alcohol abuse    History of cocaine use    History of echocardiogram    a. Echo 4/14: Moderate LVH, vigorous LVEF, EF 65-70%, normal wall motion, grade 2 diastolic dysfunction, mildly dilated aortic root and ascending aorta, ascending aorta 40 mm, aortic root 38 mm, mild LAE   Hx of cardiovascular stress test    a. GXT 5/14: No ischemic changes  //  b. ETT-Myoview 3/16:  Low risk, no ischemia, EF 58%   Hyperlipidemia    Hypertension    Morbid obesity (HCC)    Paroxysmal atrial fibrillation (HCC)    Occurring in 2008, with several recurrence since then (including in the setting of + cocaine on UDS).   Sleep apnea    uses CPAP   SVT (supraventricular tachycardia) (HCC)    mid to long RP SVT 09/2019    Past Surgical History:  Procedure Laterality Date   APPENDECTOMY     ATRIAL FIBRILLATION ABLATION N/A 11/27/2019   Procedure: ATRIAL FIBRILLATION ABLATION;  Surgeon: [name], MD;  [facility];  Service: Cardiovascular;  Laterality: N/A;   ROTATOR CUFF REPAIR     SVT ABLATION N/A 11/27/2019   Procedure: SVT ABLATION;  Surgeon: [name], MD;  Location: [facility] INVAS[facility] CV  LAB;  Service: Cardiovascular;  Laterality: N/A;   TONSILLECTOMY      Vitals:   09/02/20 0950  BP: (!) 154/88      Subjective Assessment - 09/02/20 0939    Subjective My coordination is terrible.  My walking is bad; stumble a lot.  Really started since the stroke I had.  No falls.  Does not use assistive device.  L side was weaker after the stroke, but it has improved.    Patient Stated Goals Pt's goals for therapy are to get back to where I was with strength and coordination.    Currently in Pain? No/denies              [facility] PT Assessment - 09/02/20 0943      Assessment   Medical Diagnosis Acute CVA    Referring Provider (PT) Miguel Aschoff, MD    Onset Date/Surgical Date 06/01/20   hospitalized   Hand Dominance Right      Precautions   Precautions Fall      Balance Screen   Has the patient fallen in the past 6 months No    Has the patient had a decrease in activity level because of a fear of falling?  No    Is the patient reluctant to leave their home because of a fear  of falling?  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    Leisure Used to go to drag strip races; afraid of doing that due to bleachers      Observation/Other Assessments   Focus on Therapeutic Outcomes (FOTO)  FOTO intake score 60      ROM / Strength   AROM / PROM / Strength Strength      Strength   Strength Assessment Site Hip;Knee;Ankle    Right/Left Hip Right;Left    Right Hip Flexion 5/5    Left Hip Flexion 5/5    Right/Left Knee Right;Left     Right Knee Flexion 5/5    Right Knee Extension 5/5    Left Knee Flexion 5/5    Left Knee Extension 5/5    Right/Left Ankle Right;Left    Right Ankle Dorsiflexion 5/5    Left Ankle Dorsiflexion 5/5      Transfers   Transfers Sit to Stand;Stand to Sit    Sit to Stand 6: Modified independent (Device/Increase time);Without upper extremity assist;From chair/3-in-1    Five time sit to stand comments  14.03    Stand to Sit 6: Modified independent (Device/Increase time);Without upper extremity assist;To chair/3-in-1      Ambulation/Gait   Ambulation/Gait Yes    Ambulation/Gait Assistance 6: Modified independent (Device/Increase time)    Ambulation Distance (Feet) 200 Feet    Assistive device None    Gait Pattern Step-through pattern;Decreased stride length;Poor foot clearance - left;Poor foot clearance - right   decreased foot clearance at times   Ambulation Surface Level;Indoor    Gait velocity 10.25 sec = 3.2 ft/sec      Standardized Balance Assessment   Standardized Balance Assessment Timed Up and Go Test      Timed Up and Go Test   TUG Normal TUG    Normal TUG (seconds) 13.56    TUG Comments Scores >13.5 sec indicate increased fall risk.      High Level Balance   High Level Balance Comments Stand EO, EC solid surface x 30 seconds; standing on foam EO x 30 seocnds, EC x 29 sec with increased sway, LOB at end, posterior direction      Functional Gait  Assessment   Gait assessed  Yes    Gait Level Surface Walks 20 ft, slow speed, abnormal gait pattern, evidence for imbalance or deviates 10-15 in outside of the 12 in walkway width. Requires more than 7 sec to ambulate 20 ft.   7.5   Change in Gait Speed Able to smoothly change walking speed without loss of balance or gait deviation. Deviate no more than 6 in outside of the 12 in walkway width.    Gait with Horizontal Head Turns Performs head turns smoothly with no change in gait. Deviates no more than 6 in outside 12 in walkway width      Gait with Vertical Head Turns Performs head turns with no change in gait. Deviates no more than 6  in outside 12 in walkway width.    Gait and Pivot Turn Pivot turns safely within 3 sec and stops quickly with no loss of balance.    Step Over Obstacle Is able to step over one shoe box (4.5 in total height) but must slow down and adjust steps to clear box safely. May require verbal cueing.    Gait with Narrow Base of Support Ambulates 4-7 steps.    Gait with Eyes Closed Walks 20 ft, slow speed, abnormal gait pattern, evidence for imbalance, deviates 10-15 in outside 12 in walkway width. Requires more than 9 sec to ambulate 20 ft.   veers to R   Ambulating Backwards Walks 20 ft, uses assistive device, slower speed, mild gait deviations, deviates 6-10 in outside 12 in walkway width.   15.84   Steps Two feet to a stair, must use rail.    Total Score 19    FGA comment: Scores <22/30 indicate increased fall risk.                      Objective measurements completed on examination: See above findings.               PT Education - 09/02/20 1502    Education Details Eval results, PT POC    Person(s) Educated Patient    Methods Explanation    Comprehension Verbalized understanding            PT Short Term Goals - 09/02/20 1512      PT SHORT TERM GOAL #1   Title Pt will be independent with HEP for improved balance and gait.  TARGET 10/01/2020    Time 4    Period Weeks    Status New      PT SHORT TERM GOAL #2   Title to be assessed, with goal written as apporpriate.    Time 4    Period Weeks    Status New      PT SHORT TERM GOAL #3   Title Sensory Organization Test to be assessed, with goal written as appropriate.    Time 4    Period Weeks    Status New      PT SHORT TERM GOAL #4   Title Pt will improve FGA score to at least 22/30 for decreased fall risk.    Baseline 19/30 at eval    Time 4    Period Weeks    Status New             PT Long  Term Goals - 09/02/20 1513      PT LONG TERM GOAL #1   Title Pt will be independent with progression of HEP for improved gait and balance.  TARGET 10/15/2020    Time 6    Period Weeks    Status New      PT LONG TERM GOAL #2   Title Pt will negotiate 4 steps with 1 handrail, alternating pattern, modified independently.    Time 6    Period Weeks    Status New      PT LONG TERM GOAL #3   Title Pt will verbalize understanding of fall prevention in home environment.    Time 6    Period Weeks    Status New      PT LONG TERM GOAL #4   Title Pt will ambulate at least 1000 ft, indoors and outdoors surfaces, independently, no LOB, for improved community ambulation.  Time 6    Period Weeks    Status New                  Plan - 09/02/20 1506    Clinical Impression Statement Pt is a 49 year old male who presents to OPPT with history of CVA 06/01/2020.  He reports having LLE weakness initially and notes decreased balance.  At eval today, pt presents with decreased high level balance, decreased vestibular system input for balance, decreased gait independence/slowed/cautious gait pattern.  He demonstrates increased fall risk per FGA score of 19/30; he demonstrates decreased vestibular input with eyes closed/foam surface.  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  He would benefit from skilled PT to address the above stated deficits for improved overall functional moiblity and decreased fall risk.    Personal Factors and Comorbidities Comorbidity 3+    Comorbidities diabetes, HTN, morbid obesity, history of polysubstance abuse (cocaine and alcohol)    Examination-Activity Limitations Locomotion Level;Transfers;Stairs    Examination-Participation Restrictions Community Activity;Yard Work    Conservation officer, historic buildings Evolving/Moderate complexity    Clinical Decision Making Moderate    Rehab Potential Good    PT Frequency 2x / week    PT Duration 6 weeks    plus eval   PT Treatment/Interventions ADLs/Self Care Home Management;Gait training;Stair training;Functional mobility training;Therapeutic activities;Therapeutic exercise;Balance training;Neuromuscular re-education;Patient/family education    PT Next Visit Plan Complete 6 MWT and Sensory Organization Test and write goals as appropriate.  Initiate HEP for balance    Consulted and Agree with Plan of Care Patient           Patient will benefit from skilled therapeutic intervention in order to improve the following deficits and impairments:  Abnormal gait, Difficulty walking, Decreased balance, Decreased mobility  Visit Diagnosis: Other abnormalities of gait and mobility  Unsteadiness on feet     Problem List Patient Active Problem List   Diagnosis Date Noted   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   Acute cerebrovascular accident (CVA) (HCC) 06/01/2020   Paronychia of right thumb 05/10/2020   Mediastinal adenopathy 01/06/2020   Secondary hypercoagulable state (HCC) 10/22/2019   Acute pancreatitis 01/22/2019   Class 3 obesity due to excess calories with serious comorbidity and body mass index (BMI) of 40.0 to 44.9 in adult 12/08/2016   Paroxysmal atrial fibrillation (HCC) 01/23/2013   Diabetes mellitus (HCC) 01/23/2013   Hypertensive heart disease 01/23/2013   Tobacco user 01/23/2013   Chronic diastolic heart failure (HCC) 01/21/2013   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 09/02/2020, 3:16 PM Gean Maidens., PT  [facility] [address] [place], [place], [address] Phone: [phone]   Fax:  [phone]  Name: [name]. MRN: [id number] Date of Birth:  10/13/1971

## 2020-09-06 ENCOUNTER — Ambulatory Visit: Payer: 59 | Admitting: Physical Therapy

## 2020-09-06 ENCOUNTER — Ambulatory Visit: Payer: 59 | Admitting: Occupational Therapy

## 2020-09-06 ENCOUNTER — Other Ambulatory Visit: Payer: Self-pay

## 2020-09-06 DIAGNOSIS — R41842 Visuospatial deficit: Secondary | ICD-10-CM

## 2020-09-06 DIAGNOSIS — R278 Other lack of coordination: Secondary | ICD-10-CM

## 2020-09-06 DIAGNOSIS — R2681 Unsteadiness on feet: Secondary | ICD-10-CM

## 2020-09-06 DIAGNOSIS — R2689 Other abnormalities of gait and mobility: Secondary | ICD-10-CM

## 2020-09-06 DIAGNOSIS — I69318 Other symptoms and signs involving cognitive functions following cerebral infarction: Secondary | ICD-10-CM

## 2020-09-06 NOTE — Therapy (Signed)
[facility] [address] [place], [place], [address] Phone: [phone]   Fax:  [phone]  Occupational Therapy Evaluation  Patient Details  Name: [name]. MRN: [id number] Date of Birth: 04-20-1971 No data recorded  Encounter Date: 09/06/2020   OT End of Session - 09/06/20 1010    Visit Number 1    Number of Visits 12    Date for OT Re-Evaluation 10/15/20    Authorization Type UHC    OT Start Time 0845    OT Stop Time 0930    OT Time Calculation (min) 45 min    Activity Tolerance Patient tolerated treatment well;Patient limited by lethargy    Behavior During Therapy [facility] for tasks assessed/performed;Restless           Past Medical History:  Diagnosis Date  . Diabetes mellitus   . GERD (gastroesophageal reflux disease)   . History of alcohol abuse   . History of cocaine use   . History of echocardiogram    a. Echo 4/14: Moderate LVH, vigorous LVEF, EF 65-70%, normal wall motion, grade 2 diastolic dysfunction, mildly dilated aortic root and ascending aorta, ascending aorta 40 mm, aortic root 38 mm, mild LAE  . Hx of cardiovascular stress test    a. GXT 5/14: No ischemic changes  //  b. ETT-Myoview 3/16:  Low risk, no ischemia, EF 58%  . Hyperlipidemia   . Hypertension   . Morbid obesity (HCC)   . Paroxysmal atrial fibrillation (HCC)    Occurring in 2008, with several recurrence since then (including in the setting of + cocaine on UDS).  . Sleep apnea    uses CPAP  . SVT (supraventricular tachycardia) (HCC)    mid to long RP SVT 09/2019    Past Surgical History:  Procedure Laterality Date  . APPENDECTOMY    . ATRIAL FIBRILLATION ABLATION N/A 11/27/2019   Procedure: ATRIAL FIBRILLATION ABLATION;  Surgeon: [name], MD;  [facility];  Service: Cardiovascular;  Laterality: N/A;  . ROTATOR CUFF REPAIR    . SVT ABLATION N/A 11/27/2019   Procedure: SVT ABLATION;  Surgeon:  [name], MD;  [facility]: [facility];  Service: Cardiovascular;  Laterality: N/A;  . TONSILLECTOMY      There were no vitals filed for this visit.   Subjective Assessment - 09/06/20 0854    Pertinent History CVA 06/01/20. PMH: HTN, DM, sleep apnea, ETOH and cocaine abuse    Limitations no heavy lifting    Currently in Pain? 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    Leisure Used to go to drag strip races; afraid of doing that due to bleachers      ADL  Eating/Feeding Independent    Grooming Independent    Upper Body Bathing Modified independent    Lower Body Bathing Modified independent    Upper Body Dressing Independent    Lower Body Dressing Independent    Toilet Transfer Independent    Toileting - Clothing Manipulation Independent    Toileting -  Hygiene Independent    Tub/Shower Transfer Modified independent    ADL comments Sister lives with pt      IADL   Shopping Shops independently for small purchases    Light Housekeeping Does personal laundry completely;Performs light daily tasks such as dishwashing, bed making   sister does most cleaning   Meal Prep Able to complete simple warm meal prep;Able to complete simple cold meal and snack prep    Community Mobility Drives own vehicle   but safety concerns with this   Medication Management Is responsible for taking medication in  correct dosages at correct time      Mobility   Mobility Status Independent      Written Expression   Dominant Hand Right    Handwriting --   Pt reports always has been bad, noted ? apraxia or aphasia     Vision - History   Baseline Vision Wears glasses only for reading    Visual History Retinopathy      Vision Assessment   Ocular Range of Motion Within Functional Limits    Tracking/Visual Pursuits Able to track stimulus in all quads without difficulty   however required cues to stay awake   Convergence Within functional limits    Visual Fields No apparent deficits   but reports bumping into things (could not remember if Lt)   Diplopia Assessment --   denies   Comment Pt reports RT/LT discrimination always has been impaired. However pt reports trouble backing up out of parking space, making turns too wide, ? apraxia (also noted w/ writing) or depth perception deficits. Double simutaneous stimulation appears WFL's when patient paying attention      Cognition   Overall Cognitive Status Cognition to be further assessed in functional context PRN    Cognition Comments decreased attention noted during evaluation. pt also very fidgety and requied stimulus to stay awake with visual tracking assessment      Observation/Other Assessments   Observations somewhat distracted/fidgety, ? apraxia      Sensation   Light Touch Appears Intact      Coordination   9 Hole Peg Test Right;Left    Right 9 Hole Peg Test 32.97 sec    Left 9 Hole Peg Test 44.25 sec      Praxis   Praxis Impaired    Praxis Impairment Details Ideomotor   ??     Edema   Edema none      ROM / Strength   AROM / PROM / Strength AROM;Strength      AROM   Overall AROM Comments BUE AROM WNL'S      Strength   Overall Strength Comments BUE MMT grossly 5/5      Hand Function   Right Hand Grip (lbs) 92.3 lb    Left Hand Grip (lbs) 86.6 lb                                OT Long Term Goals -  09/06/20 1304      OT LONG TERM GOAL #1   Title Independent with coordination HEP  Time 6    Period Weeks    Status New      OT LONG TERM GOAL #2   Title Improve coordination Lt hand as evidenced by reducing speed on 9 hole peg test to 35 sec or less    Time 6    Period Weeks    Status New      OT LONG TERM GOAL #3   Title Pt to perform tabletop scanning at 90% or greater accuracy    Time 6    Period Weeks    Status New      OT LONG TERM GOAL #4   Title Pt to perform environmental scanning at 85% accuracy or greater while performing simple physical task and without bumping into objects/furniture/walls    Time 6    Period Weeks    Status New      OT LONG TERM GOAL #5   Title Pt to perform simulated work tasks safely    Time 6    Period Weeks    Status New      Long Term Additional Goals   Additional Long Term Goals Yes      OT LONG TERM GOAL #6   Title Pt to attend to task for 15 minutes without distraction and remain alert    Time 6    Period Weeks    Status New                 Plan - 09/06/20 1013    Clinical Impression Statement Pt is a 49 y/o male with PMH of diabetes, HTN, morbid obesity, history of polysubstance abuse (cocaine and alcohol), presents to ER with dysarthric speech, some L sided weakness. MRI on 06/01/20 reveals acute L caudate infarct. Pt presents today for O.T. evaluation with deficits in coordination, attention, possible apraxia and depth perception issues.    OT Occupational Profile and History Problem Focused Assessment - Including review of records relating to presenting problem    Occupational performance deficits (Please refer to evaluation for details): IADL's;Work    Body Structure / Function / Physical Skills IADL;Coordination;FMC;Decreased knowledge of precautions;UE functional use    Cognitive Skills Attention;Safety Awareness    Rehab Potential Good    Clinical Decision Making Several treatment options, min-mod task  modification necessary    Comorbidities Affecting Occupational Performance: May have comorbidities impacting occupational performance    Modification or Assistance to Complete Evaluation  No modification of tasks or assist necessary to complete eval    OT Frequency 2x / week    OT Duration 6 weeks   or 12 visits over extended weeks (d/t potential scheduling conflicts)   OT Treatment/Interventions Therapeutic activities;Therapeutic exercise;Cognitive remediation/compensation;Neuromuscular education;Functional Mobility Training;Visual/perceptual remediation/compensation;Patient/family education    Plan coordination HEP, tabletop and visual scanning    Consulted and Agree with Plan of Care Patient           Patient will benefit from skilled therapeutic intervention in order to improve the following deficits and impairments:   Body Structure / Function / Physical Skills: IADL, Coordination, FMC, Decreased knowledge of precautions, UE functional use Cognitive Skills: Attention, Safety Awareness     Visit Diagnosis: Other lack of coordination  Other symptoms and signs involving cognitive functions following cerebral infarction  Visuospatial deficit  Unsteadiness on feet    Problem List Patient Active Problem List   Diagnosis Date Noted  . 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 Acute cerebrovascular accident (CVA) (HCC) 06/01/2020  . Paronychia of  right thumb 05/10/2020  . Mediastinal adenopathy 01/06/2020  . Secondary hypercoagulable state (HCC) 10/22/2019  . Acute pancreatitis 01/22/2019  . Class 3 obesity due to excess calories with serious comorbidity and body mass index (BMI) of 40.0 to 44.9 in adult 12/08/2016  . Paroxysmal atrial fibrillation (HCC) 01/23/2013  . Diabetes mellitus (HCC) 01/23/2013  . Hypertensive heart disease 01/23/2013  . Tobacco user 01/23/2013  . Chronic diastolic heart failure (HCC) 01/21/2013  . 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 Shoulder pain, right 02/15/2011  . Seasonal and perennial allergic rhinitis 02/13/2008  . Dyslipidemia 02/12/2008  . Cocaine abuse (HCC) 02/12/2008  . Obstructive sleep apnea 02/12/2008    Kelli ChurnBallie, Tifani Dack Johnson, OTR/L 09/06/2020, 2:28 PM  [facility] [address] [place], [place], [address] Phone: [phone]   Fax:  [phone]  Name: Douglas BandLeonidas Claude Deford Jr. MRN: [id number] Date of Birth: 16-Oct-1971

## 2020-09-06 NOTE — Patient Instructions (Signed)
Initiated HEP with balance exercise handouts:  -heel walking x 3 reps along counter -toe walking x 3 reps along counter -tandem gait forward/back along counter x 3 reps -Single limb stance x 3 reps, 10 sec -Tandem stance x 3 reps, 30 sec  Perform once daily

## 2020-09-06 NOTE — Therapy (Signed)
[facility] [address] [place], [place], [address] Phone: [phone]   Fax:  [phone]  Physical Therapy Treatment  Patient Details  Name: [name]. MRN: [id number] Date of Birth: April 14, 1971 Referring Provider (PT): Miguel Aschoff, MD   Encounter Date: 09/06/2020   PT End of Session - 09/06/20 0801    Visit Number 2    Number of Visits 13    Authorization Type UHC    PT Start Time [phone]    PT Stop Time 0845    PT Time Calculation (min) 47 min    Activity Tolerance Patient tolerated treatment well    Behavior During Therapy [facility] for tasks assessed/performed   seems fatigued at times; eyes closed occasionally          Past Medical History:  Diagnosis Date  . Diabetes mellitus   . GERD (gastroesophageal reflux disease)   . History of alcohol abuse   . History of cocaine use   . History of echocardiogram    a. Echo 4/14: Moderate LVH, vigorous LVEF, EF 65-70%, normal wall motion, grade 2 diastolic dysfunction, mildly dilated aortic root and ascending aorta, ascending aorta 40 mm, aortic root 38 mm, mild LAE  . Hx of cardiovascular stress test    a. GXT 5/14: No ischemic changes  //  b. ETT-Myoview 3/16:  Low risk, no ischemia, EF 58%  . Hyperlipidemia   . Hypertension   . Morbid obesity (HCC)   . Paroxysmal atrial fibrillation (HCC)    Occurring in 2008, with several recurrence since then (including in the setting of + cocaine on UDS).  . Sleep apnea    uses CPAP  . SVT (supraventricular tachycardia) (HCC)    mid to long RP SVT 09/2019    Past Surgical History:  Procedure Laterality Date  . APPENDECTOMY    . ATRIAL FIBRILLATION ABLATION N/A 11/27/2019   Procedure: ATRIAL FIBRILLATION ABLATION;  Surgeon: [name], MD;  [facility];  Service: Cardiovascular;  Laterality: N/A;  . ROTATOR CUFF REPAIR    . SVT ABLATION N/A 11/27/2019   Procedure: SVT ABLATION;   Surgeon: [name], MD;  Location: [facility] INVASIVE CV LAB;  Service: Cardiovascular;  Laterality: N/A;  . TONSILLECTOMY      There were no vitals filed for this visit.   Subjective Assessment - 09/06/20 0800    Subjective No changes since last visit.    Patient Stated Goals Pt's goals for therapy are to get back to where I was with strength and coordination.    Currently in Pain? No/denies              [facility] PT Assessment - 09/06/20 0001      6 Minute Walk- Baseline   6 Minute Walk- Baseline yes    BP (mmHg) 142/86    HR (bpm) 74    02 Sat (%RA) 96 %      6 Minute walk- Post Test   6 Minute Walk Post Test yes    BP (mmHg) 152/88    HR (bpm) 75    02 Sat (%RA) 98 %    Modified Borg Scale for Dyspnea 2- Mild shortness of breath    Perceived Rate of Exertion (Borg) 9- very light      6 minute walk test results    Aerobic Endurance Distance Walked 994  [facility] Adult PT Treatment/Exercise - 09/06/20 0001      Therapeutic Activites    Therapeutic Activities Other Therapeutic Activities    Other Therapeutic Activities Discussed pt's fatigue and not sleeping well; pt questions if that is from his CVA (but he does say he has a CPAP).  Encouraged pt to talk to MD about sleep and CPAP concerns.               Balance Exercises - 09/06/20 0001      Balance Exercises: Standing   Tandem Stance Eyes open;Upper extremity support 2;2 reps;30 secs    SLS Eyes open;Solid surface;Upper extremity support 2;2 reps;10 secs    Tandem Gait Forward;Retro;Upper extremity support;2 reps;Limitations    Tandem Gait Limitations At counter, then progress x 2 reps to tandem march forward and back    Sidestepping 2 reps;Limitations    Sidestepping Limitations Then progressed to sidestepping with head turns at counter R and L x 2 reps.    Marching Solid surface;Dynamic;Static;Forwards;10 reps;Limitations    Marching Limitations Marching in place x 10;  forward marching at counter x 4 reps    Heel Raises Both;20 reps    Toe Raise 20 reps;Both    Other Standing Exercises Heel walking x 4 reps at counter, toe walking x 4 reps at counter             PT Education - 09/06/20 0954    Education Details Initiated HEP; results of    Person(s) Educated Patient    Methods Explanation;Demonstration;Handout    Comprehension Verbalized understanding;Returned demonstration            PT Short Term Goals - 09/06/20 0957      PT SHORT TERM GOAL #1   Title Pt will be independent with HEP for improved balance and gait.  TARGET 10/01/2020    Time 4    Period Weeks    Status New      PT SHORT TERM GOAL #2   Title Pt to improve by at least 75 ft for improved gait endurance and efficiency.    Baseline 995 ft    Time 4    Period Weeks    Status Revised      PT SHORT TERM GOAL #3   Title Sensory Organization Test to be assessed, with goal written as appropriate.    Time 4    Period Weeks    Status New      PT SHORT TERM GOAL #4   Title Pt will improve FGA score to at least 22/30 for decreased fall risk.    Baseline 19/30 at eval    Time 4    Period Weeks    Status New             PT Long Term Goals - 09/02/20 1513      PT LONG TERM GOAL #1   Title Pt will be independent with progression of HEP for improved gait and balance.  TARGET 10/15/2020    Time 6    Period Weeks    Status New      PT LONG TERM GOAL #2   Title Pt will negotiate 4 steps with 1 handrail, alternating pattern, modified independently.    Time 6    Period Weeks    Status New      PT LONG TERM GOAL #3   Title Pt will verbalize understanding of fall prevention in home environment.    Time 6  Period Weeks    Status New      PT LONG TERM GOAL #4   Title Pt will ambulate at least 1000 ft, indoors and outdoors surfaces, independently, no LOB, for improved community ambulation.    Time 6    Period Weeks    Status New                  Plan - 09/06/20 0955    Clinical Impression Statement test completed today, with pt ambulating 994 ft distance (well below norms at least 1800 ft).  Did not complete SOT today, due to pt seemed fatigued after 6 MWT; PT decided to initiate basic balance HEP.  He will continue to benefit from skilled PT to work towards goals for improved overall mobility.    Personal Factors and Comorbidities Comorbidity 3+    Comorbidities diabetes, HTN, morbid obesity, history of polysubstance abuse (cocaine and alcohol)    Examination-Activity Limitations Locomotion Level;Transfers;Stairs    Examination-Participation Restrictions Community Activity;Yard Work    Conservation officer, historic buildings Evolving/Moderate complexity    Rehab Potential Good    PT Frequency 2x / week    PT Duration 6 weeks   plus eval   PT Treatment/Interventions ADLs/Self Care Home Management;Gait training;Stair training;Functional mobility training;Therapeutic activities;Therapeutic exercise;Balance training;Neuromuscular re-education;Patient/family education    PT Next Visit Plan Complete Sensory Organization Test and write goals as appropriate.  Review initial HEP for balance and progress as needed; likely corner balance exercises    Consulted and Agree with Plan of Care Patient           Patient will benefit from skilled therapeutic intervention in order to improve the following deficits and impairments:  Abnormal gait, Difficulty walking, Decreased balance, Decreased mobility  Visit Diagnosis: Other abnormalities of gait and mobility  Unsteadiness on feet     Problem List Patient Active Problem List   Diagnosis Date Noted  . 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 Acute cerebrovascular accident (CVA) (HCC) 06/01/2020  . Paronychia of right thumb 05/10/2020  . Mediastinal adenopathy 01/06/2020  . Secondary hypercoagulable state (HCC) 10/22/2019  . Acute pancreatitis 01/22/2019  . Class 3 obesity  due to excess calories with serious comorbidity and body mass index (BMI) of 40.0 to 44.9 in adult 12/08/2016  . Paroxysmal atrial fibrillation (HCC) 01/23/2013  . Diabetes mellitus (HCC) 01/23/2013  . Hypertensive heart disease 01/23/2013  . Tobacco user 01/23/2013  . Chronic diastolic heart failure (HCC) 01/21/2013  . 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 Shoulder pain, right 02/15/2011  . Seasonal and perennial allergic rhinitis 02/13/2008  . Dyslipidemia 02/12/2008  . Cocaine abuse (HCC) 02/12/2008  . Obstructive sleep apnea 02/12/2008    Marvetta Vohs W. 09/06/2020, 10:00 AM Gean Maidens., PT  [facility] [address] [place], [place], [address] Phone: [phone]   Fax:  [phone]  Name: Rocket Gunderson. MRN: [id number] Date of Birth: Apr 20, 1971

## 2020-09-08 ENCOUNTER — Encounter: Payer: Self-pay | Admitting: Internal Medicine

## 2020-09-08 ENCOUNTER — Ambulatory Visit (INDEPENDENT_AMBULATORY_CARE_PROVIDER_SITE_OTHER): Payer: 59 | Admitting: Internal Medicine

## 2020-09-08 ENCOUNTER — Ambulatory Visit: Payer: 59 | Admitting: Physical Therapy

## 2020-09-08 ENCOUNTER — Other Ambulatory Visit: Payer: Self-pay

## 2020-09-08 VITALS — BP 182/107 | HR 74

## 2020-09-08 VITALS — BP 160/79 | HR 73 | Temp 97.3°F | Ht 69.0 in | Wt 306.2 lb

## 2020-09-08 DIAGNOSIS — I119 Hypertensive heart disease without heart failure: Secondary | ICD-10-CM

## 2020-09-08 DIAGNOSIS — I10 Essential (primary) hypertension: Secondary | ICD-10-CM

## 2020-09-08 DIAGNOSIS — R413 Other amnesia: Secondary | ICD-10-CM

## 2020-09-08 DIAGNOSIS — R2681 Unsteadiness on feet: Secondary | ICD-10-CM

## 2020-09-08 DIAGNOSIS — R278 Other lack of coordination: Secondary | ICD-10-CM | POA: Diagnosis not present

## 2020-09-08 MED ORDER — SPIRONOLACTONE 25 MG PO TABS
25.0000 mg | ORAL_TABLET | Freq: Every day | ORAL | 0 refills | Status: DC
Start: 2020-09-08 — End: 2020-10-04

## 2020-09-08 NOTE — Progress Notes (Signed)
   CC: Hypertension, amnesia  HPI:  Mr.Saturnino Keeven Matty. is a 49 y.o. with a history listed below presenting for follow-up of his hypertension and concerns of amnesia episodes.   Past Medical History:  Diagnosis Date  . Diabetes mellitus   . GERD (gastroesophageal reflux disease)   . History of alcohol abuse   . History of cocaine use   . History of echocardiogram    a. Echo 4/14: Moderate LVH, vigorous LVEF, EF 65-70%, normal wall motion, grade 2 diastolic dysfunction, mildly dilated aortic root and ascending aorta, ascending aorta 40 mm, aortic root 38 mm, mild LAE  . Hx of cardiovascular stress test    a. GXT 5/14: No ischemic changes  //  b. ETT-Myoview 3/16:  Low risk, no ischemia, EF 58%  . Hyperlipidemia   . Hypertension   . Morbid obesity (HCC)   . Paroxysmal atrial fibrillation (HCC)    Occurring in 2008, with several recurrence since then (including in the setting of + cocaine on UDS).  . Sleep apnea    uses CPAP  . SVT (supraventricular tachycardia) (HCC)    mid to long RP SVT 09/2019   Review of Systems:  Constitutional: Negative for chills and fever.  Respiratory: Negative for shortness of breath.   Cardiovascular: Negative for chest pain and leg swelling.  Gastrointestinal: Negative for abdominal pain, nausea and vomiting.  Neurological: Negative for dizziness and headaches.   Physical Exam:  Vitals:   09/08/20 0912  BP: (!) 160/79  Pulse: 73  Temp: (!) 97.3 F (36.3 C)  SpO2: 98%  Weight: (!) 306 lb 3.2 oz (138.9 kg)  Height: 5\' 9"  (1.753 m)   Physical Exam Constitutional:      General: He is not in acute distress.    Appearance: He is obese.  HENT:     Head: Normocephalic and atraumatic.     Mouth/Throat:     Mouth: Mucous membranes are moist.     Pharynx: Oropharynx is clear.  Cardiovascular:     Rate and Rhythm: Normal rate and regular rhythm.     Pulses: Normal pulses.     Heart sounds: Normal heart sounds.  Pulmonary:     Effort:  Pulmonary effort is normal.     Breath sounds: Normal breath sounds.  Abdominal:     General: Abdomen is flat. Bowel sounds are normal.     Palpations: Abdomen is soft.  Musculoskeletal:        General: No swelling. Normal range of motion.  Skin:    General: Skin is warm and dry.     Capillary Refill: Capillary refill takes less than 2 seconds.  Neurological:     Mental Status: He is alert and oriented to person, place, and time. Mental status is at baseline.  Psychiatric:        Mood and Affect: Mood normal.        Behavior: Behavior normal.      Assessment & Plan:   See Encounters Tab for problem based charting.  Patient discussed with Dr. 

## 2020-09-08 NOTE — Therapy (Signed)
[facility] [address] [place], [place], [address] Phone: [phone]   Fax:  [phone]  Physical Therapy Treatment  Patient Details  Name: [name]. MRN: [id number] Date of Birth: Oct 14, 1971 Referring Provider (PT): Miguel Aschoff, MD   Encounter Date: 09/08/2020   PT End of Session - 09/08/20 0847    Visit Number 3    Number of Visits 13    Authorization Type UHC    PT Start Time 0806    PT Stop Time 0838    PT Time Calculation (min) 32 min    Activity Tolerance Patient tolerated treatment well    Behavior During Therapy [facility] for tasks assessed/performed   seems fatigued at times; eyes closed occasionally          Past Medical History:  Diagnosis Date  . Diabetes mellitus   . GERD (gastroesophageal reflux disease)   . History of alcohol abuse   . History of cocaine use   . History of echocardiogram    a. Echo 4/14: Moderate LVH, vigorous LVEF, EF 65-70%, normal wall motion, grade 2 diastolic dysfunction, mildly dilated aortic root and ascending aorta, ascending aorta 40 mm, aortic root 38 mm, mild LAE  . Hx of cardiovascular stress test    a. GXT 5/14: No ischemic changes  //  b. ETT-Myoview 3/16:  Low risk, no ischemia, EF 58%  . Hyperlipidemia   . Hypertension   . Morbid obesity (HCC)   . Paroxysmal atrial fibrillation (HCC)    Occurring in 2008, with several recurrence since then (including in the setting of + cocaine on UDS).  . Sleep apnea    uses CPAP  . SVT (supraventricular tachycardia) (HCC)    mid to long RP SVT 09/2019    Past Surgical History:  Procedure Laterality Date  . APPENDECTOMY    . ATRIAL FIBRILLATION ABLATION N/A 11/27/2019   Procedure: ATRIAL FIBRILLATION ABLATION;  Surgeon: [name], MD;  Location: [facility];  Service: Cardiovascular;  Laterality: N/A;  . ROTATOR CUFF REPAIR    . SVT ABLATION N/A 11/27/2019   Procedure: SVT ABLATION;   Surgeon: [name], MD;  Location: [facility]VASIVE CV LAB;  Service: Cardiovascular;  Laterality: N/A;  . TONSILLECTOMY      Vitals:   09/08/20 0820 09/08/20 0831  BP: (!) 199/109 (!) 182/107  Pulse:  74     Subjective Assessment - 09/08/20 0846    Subjective Have a concern to talk about:  sometimes I get confused, almost like I black out.  Sometimes happens when I'm driving.    Patient Stated Goals Pt's goals for therapy are to get back to where I was with strength and coordination.    Currently in Pain? No/denies             Self Care: At beginning of session, pt reports sometimes having intermittent confusion and nearly "blacking out"; also has episodes where his vision is completely white (when he is outdoors in bright areas).  Addressed this as follows:  -PT discussed pt's PMH of narcolepsy and OSA-he reports NOT taking Adderrall at this time (which was prescribed to address this) -PT discussed CVA and TIA warning signs and symptoms, then CVA risk factors -PT discussed ways to manage risk factors, including medication compliance (Pt reports not taking BP meds this morning due to running late; but he also reports taking some of his medications twice per day (that appear to be once per  day on his medication list)) -When BP measures assessed and elevated BP measures note, PT recommended pt follow up with his physician.  Recommended that pt talk to MD about the above symptoms (he feels are worse since recent CVA) and take medication as prescribed by MD.                        PT Education - 09/08/20 0847    Education Details CVA and TIA warning signs and risk factors    Person(s) Educated Patient    Methods Explanation;Handout    Comprehension Verbalized understanding            PT Short Term Goals - 09/06/20 0957      PT SHORT TERM GOAL #1   Title Pt will be independent with HEP for improved balance and gait.  TARGET 10/01/2020    Time 4    Period  Weeks    Status New      PT SHORT TERM GOAL #2   Title Pt to improve by at least 75 ft for improved gait endurance and efficiency.    Baseline 995 ft    Time 4    Period Weeks    Status Revised      PT SHORT TERM GOAL #3   Title Sensory Organization Test to be assessed, with goal written as appropriate.    Time 4    Period Weeks    Status New      PT SHORT TERM GOAL #4   Title Pt will improve FGA score to at least 22/30 for decreased fall risk.    Baseline 19/30 at eval    Time 4    Period Weeks    Status New             PT Long Term Goals - 09/02/20 1513      PT LONG TERM GOAL #1   Title Pt will be independent with progression of HEP for improved gait and balance.  TARGET 10/15/2020    Time 6    Period Weeks    Status New      PT LONG TERM GOAL #2   Title Pt will negotiate 4 steps with 1 handrail, alternating pattern, modified independently.    Time 6    Period Weeks    Status New      PT LONG TERM GOAL #3   Title Pt will verbalize understanding of fall prevention in home environment.    Time 6    Period Weeks    Status New      PT LONG TERM GOAL #4   Title Pt will ambulate at least 1000 ft, indoors and outdoors surfaces, independently, no LOB, for improved community ambulation.    Time 6    Period Weeks    Status New                 Plan - 09/08/20 0848    Clinical Impression Statement Education provided regarding CVA/TIA warning signs and risk factors.  Pt reports not taking Adderrall and also not having had BP medication today.  He also reports some days taking multiple doses of medications (morning and evening).  Recommended pt follow up with medication compliance questions/any new or changing symptoms since CVA with MD.    Personal Factors and Comorbidities Comorbidity 3+    Comorbidities diabetes, HTN, morbid obesity, history of polysubstance abuse (cocaine and alcohol)    Examination-Activity Limitations Locomotion Level;Transfers;Stairs  Examination-Participation Restrictions Community Activity;Yard Work    Conservation officer, historic buildings Evolving/Moderate complexity    Rehab Potential Good    PT Frequency 2x / week    PT Duration 6 weeks   plus eval   PT Treatment/Interventions ADLs/Self Care Home Management;Gait training;Stair training;Functional mobility training;Therapeutic activities;Therapeutic exercise;Balance training;Neuromuscular re-education;Patient/family education    PT Next Visit Plan Defer SOT test at this time (will address balance through exercises on compliant surfaces); review initial HEP for balance and progress as needed.  Likely corner balance exercises.    Consulted and Agree with Plan of Care Patient           Patient will benefit from skilled therapeutic intervention in order to improve the following deficits and impairments:  Abnormal gait, Difficulty walking, Decreased balance, Decreased mobility  Visit Diagnosis: Unsteadiness on feet     Problem List Patient Active Problem List   Diagnosis Date Noted  . 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 Acute cerebrovascular accident (CVA) (HCC) 06/01/2020  . Paronychia of right thumb 05/10/2020  . Mediastinal adenopathy 01/06/2020  . Secondary hypercoagulable state (HCC) 10/22/2019  . Acute pancreatitis 01/22/2019  . Class 3 obesity due to excess calories with serious comorbidity and body mass index (BMI) of 40.0 to 44.9 in adult 12/08/2016  . Paroxysmal atrial fibrillation (HCC) 01/23/2013  . Diabetes mellitus (HCC) 01/23/2013  . Hypertensive heart disease 01/23/2013  . Tobacco user 01/23/2013  . Chronic diastolic heart failure (HCC) 01/21/2013  . 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 Shoulder pain, right 02/15/2011  . Seasonal and perennial allergic rhinitis 02/13/2008  . Dyslipidemia 02/12/2008  . Cocaine abuse (HCC) 02/12/2008  . Obstructive sleep apnea 02/12/2008    Dontre Laduca W. 09/08/2020, 8:51 AM    Gean Maidens., PT   [facility] [address] [place], [place], [address] Phone: [phone]   Fax:  [phone]  Name: Douglas Lieb. MRN: [id number] Date of Birth: 12/16/1970

## 2020-09-08 NOTE — Assessment & Plan Note (Signed)
Patient states that while he was at his neuro rehab he was found to have elevated blood pressure of 182/107, he was advised to come into clinic to be evaluated.  He states that he is currently on metoprolol 50 mg daily and lisinopril 40 mg, he states that he had been taking lisinopril 40 mg twice a day for the past week except for today or he did not take it this morning.  When asked why he was taking lisinopril twice a day he states that he previously had been on a lower dose of lisinopril twice a day.  On chart review it does not appear that he had been on lisinopril twice a day, we discussed that lisinopril is not a twice a day medication.  He reports having mild episode of lightheadedness when he initially presented to the clinic however this had resolved completely.  He denies any current chest pain, headaches, lightheadedness, dizziness, shortness of breath, palpitations,, or weakness. Patient has a recent history of acute CVA of the left caudate body in 06/01/2020 that was thought to possibly be due to hypertension and medication noncompliance prior to that hospitalization.  His visit on 9/27 and 10/25 showed well controlled blood pressure readings.  We discussed the importance of improvement of his blood pressure to decrease risk of recurrent stroke and other complications.  Advised to stop taking lisinopril twice a day.  Patient is currently on Cardizem as needed for paroxysmal A. fib states he has not taken it since January.  He had been on hydrochlorothiazide in the past and this was stopped due to concern that was related to pancreatitis.  -Continue metoprolol 50 mg daily -Continue lisinopril 40 mg daily -Add spironolactone 25 mg daily -Check BMP today -RTC in 2 weeks to repeat labs and blood pressure check

## 2020-09-08 NOTE — Assessment & Plan Note (Signed)
Patient states that he has been having episodes of forgetfulness since his stroke, states that it happened about five times.  He describes a time where he took his sister's car to the grocery store in when he walked out he was looking for his car and called his sister telling her he was going to call the cops because his car was stolen.  Also described times where he would be driving to location and would completely forget the drive in the car ride.  He has never had any loss of consciousness, falls, headaches, lightheadedness, chest pain, palpitations, shortness of breath, any symptoms around these episodes. Unclear etiology of this amnesia. Advised patient to follow up with neurology regarding this.

## 2020-09-08 NOTE — Patient Instructions (Addendum)
[name],  It was a pleasure to see you today. Thank you for coming in.   Today we discussed your blood pressure. In regards to this please continue the metoprolol 50 mg daily. Please only take Lisinopril 40 mg once per day. I have added a medication called spironolactone, please start taking this daily.   In regards to your memory issues, please follow up with your neurologist for this.   Please return to clinic in 2 weeks or sooner if needed.   Thank you again for coming in.   Claudean Severance.D.

## 2020-09-09 LAB — BMP8+ANION GAP
Anion Gap: 10 mmol/L (ref 10.0–18.0)
BUN/Creatinine Ratio: 12 (ref 9–20)
BUN: 13 mg/dL (ref 6–24)
CO2: 28 mmol/L (ref 20–29)
Calcium: 9 mg/dL (ref 8.7–10.2)
Chloride: 102 mmol/L (ref 96–106)
Creatinine, Ser: 1.06 mg/dL (ref 0.76–1.27)
GFR calc Af Amer: 95 mL/min/{1.73_m2} (ref >59)
GFR calc non Af Amer: 82 mL/min/{1.73_m2} (ref >59)
Glucose: 149 mg/dL — ABNORMAL HIGH (ref 65–99)
Potassium: 5 mmol/L (ref 3.5–5.2)
Sodium: 140 mmol/L (ref 134–144)

## 2020-09-11 NOTE — Progress Notes (Signed)
Internal Medicine Clinic Attending ° °Case discussed with Dr. Krienke  At the time of the visit.  We reviewed the resident’s history and exam and pertinent patient test results.  I agree with the assessment, diagnosis, and plan of care documented in the resident’s note.  °

## 2020-09-14 ENCOUNTER — Other Ambulatory Visit: Payer: Self-pay

## 2020-09-14 ENCOUNTER — Ambulatory Visit: Payer: 59 | Admitting: Occupational Therapy

## 2020-09-14 ENCOUNTER — Ambulatory Visit: Payer: 59 | Admitting: Physical Therapy

## 2020-09-14 VITALS — BP 140/85 | HR 75

## 2020-09-14 DIAGNOSIS — R2681 Unsteadiness on feet: Secondary | ICD-10-CM

## 2020-09-14 DIAGNOSIS — R278 Other lack of coordination: Secondary | ICD-10-CM

## 2020-09-14 DIAGNOSIS — R2689 Other abnormalities of gait and mobility: Secondary | ICD-10-CM

## 2020-09-14 DIAGNOSIS — R41842 Visuospatial deficit: Secondary | ICD-10-CM

## 2020-09-14 NOTE — Therapy (Signed)
[facility] [address] [place], [place], [address] Phone: [phone]   Fax:  [phone]  Occupational Therapy Treatment  Patient Details  Name: [name]. MRN: [id number] Date of Birth: 1971-08-14 No data recorded  Encounter Date: 09/14/2020   OT End of Session - 09/14/20 0955    Visit Number 2    Number of Visits 12    Date for OT Re-Evaluation 10/15/20    Authorization Type UHC    OT Start Time 0930    OT Stop Time 1015    OT Time Calculation (min) 45 min    Activity Tolerance Patient tolerated treatment well;Patient limited by lethargy    Behavior During Therapy [facility] for tasks assessed/performed;Restless           Past Medical History:  Diagnosis Date  . Diabetes mellitus   . GERD (gastroesophageal reflux disease)   . History of alcohol abuse   . History of cocaine use   . History of echocardiogram    a. Echo 4/14: Moderate LVH, vigorous LVEF, EF 65-70%, normal wall motion, grade 2 diastolic dysfunction, mildly dilated aortic root and ascending aorta, ascending aorta 40 mm, aortic root 38 mm, mild LAE  . Hx of cardiovascular stress test    a. GXT 5/14: No ischemic changes  //  b. ETT-Myoview 3/16:  Low risk, no ischemia, EF 58%  . Hyperlipidemia   . Hypertension   . Morbid obesity (HCC)   . Paroxysmal atrial fibrillation (HCC)    Occurring in 2008, with several recurrence since then (including in the setting of + cocaine on UDS).  . Sleep apnea    uses CPAP  . SVT (supraventricular tachycardia) (HCC)    mid to long RP SVT 09/2019    Past Surgical History:  Procedure Laterality Date  . APPENDECTOMY    . ATRIAL FIBRILLATION ABLATION N/A 11/27/2019   Procedure: ATRIAL FIBRILLATION ABLATION;  Surgeon: [name], MD;  [facility]: [facility] INV[facility];  Service: Cardiovascular;  Laterality: N/A;  . ROTATOR CUFF REPAIR    . SVT ABLATION N/A 11/27/2019   Procedure: SVT ABLATION;  Surgeon:  [name], MD;  Location: [facility] INVASIVE CV LAB;  Service: Cardiovascular;  Laterality: N/A;  . TONSILLECTOMY      There were no vitals filed for this visit.   Subjective Assessment - 09/14/20 0934    Subjective  I had a fall last week    Pertinent History CVA 06/01/20. PMH: HTN, DM, sleep apnea, ETOH and cocaine abuse    Limitations no heavy lifting    Currently in Pain? No/denies            Pt issued coordination HEP - pt returned demo of each w/ min cues.  Pt performing tabletop scanning (letter cancellation 29M print size) with approx 70% accuracy. Pt performing environmental scanning first pass finding only 9/14 items (64% accuracy), found 5 remaining items on 2nd pass with cues provided.                     OT Education - 09/14/20 0944    Education Details coordination HEP    Person(s) Educated Patient    Methods Explanation;Demonstration;Handout;Verbal cues    Comprehension Verbalized understanding;Returned demonstration               OT Long Term Goals - 09/14/20 0955      OT LONG TERM GOAL #1   Title Independent with coordination HEP  Time 6    Period Weeks    Status On-going      OT LONG TERM GOAL #2   Title Improve coordination Lt hand as evidenced by reducing speed on 9 hole peg test to 35 sec or less    Time 6    Period Weeks    Status New      OT LONG TERM GOAL #3   Title Pt to perform tabletop scanning at 90% or greater accuracy    Time 6    Period Weeks    Status On-going      OT LONG TERM GOAL #4   Title Pt to perform environmental scanning at 85% accuracy or greater while performing simple physical task and without bumping into objects/furniture/walls    Time 6    Period Weeks    Status New      OT LONG TERM GOAL #5   Title Pt to perform simulated work tasks safely    Time 6    Period Weeks    Status New      OT LONG TERM GOAL #6   Title Pt to attend to task for 15 minutes without distraction and remain alert     Time 6    Period Weeks    Status New                 Plan - 09/14/20 0956    Clinical Impression Statement Pt tolerating coordination ex's well. Therapist w/ concerns with patient driving d/t decreased visual scanning, potential depth perception issues, and decreased alertness. Pt reports he also has narcolepsy.    OT Occupational Profile and History Problem Focused Assessment - Including review of records relating to presenting problem    Occupational performance deficits (Please refer to evaluation for details): IADL's;Work    Body Structure / Function / Physical Skills IADL;Coordination;FMC;Decreased knowledge of precautions;UE functional use    Cognitive Skills Attention;Safety Awareness    Rehab Potential Good    Clinical Decision Making Several treatment options, min-mod task modification necessary    Comorbidities Affecting Occupational Performance: May have comorbidities impacting occupational performance    Modification or Assistance to Complete Evaluation  No modification of tasks or assist necessary to complete eval    OT Frequency 2x / week    OT Duration 6 weeks   or 12 visits over extended weeks (d/t potential scheduling conflicts)   OT Treatment/Interventions Therapeutic activities;Therapeutic exercise;Cognitive remediation/compensation;Neuromuscular education;Functional Mobility Training;Visual/perceptual remediation/compensation;Patient/family education    Plan visual scanning strategies, memory strategies, review coordination HEP prn    Consulted and Agree with Plan of Care Patient           Patient will benefit from skilled therapeutic intervention in order to improve the following deficits and impairments:   Body Structure / Function / Physical Skills: IADL, Coordination, FMC, Decreased knowledge of precautions, UE functional use Cognitive Skills: Attention, Safety Awareness     Visit Diagnosis: Other lack of coordination  Visuospatial  deficit    Problem List Patient Active Problem List   Diagnosis Date Noted  . Amnesia 09/08/2020  . 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 Acute cerebrovascular accident (CVA) (HCC) 06/01/2020  . Paronychia of right thumb 05/10/2020  . Mediastinal adenopathy 01/06/2020  . Secondary hypercoagulable state (HCC) 10/22/2019  . Acute pancreatitis 01/22/2019  . Class 3 obesity due to excess calories with serious comorbidity and body mass index (BMI) of 40.0 to 44.9 in adult 12/08/2016  . Paroxysmal atrial fibrillation (HCC)  01/23/2013  . Diabetes mellitus (HCC) 01/23/2013  . Hypertension 01/23/2013  . Tobacco user 01/23/2013  . Chronic diastolic heart failure (HCC) 01/21/2013  . 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 Shoulder pain, right 02/15/2011  . Seasonal and perennial allergic rhinitis 02/13/2008  . Dyslipidemia 02/12/2008  . Cocaine abuse (HCC) 02/12/2008  . Obstructive sleep apnea 02/12/2008    Kelli Churn, OTR/L 09/14/2020, 11:09 AM  [facility] [address] [place], [place], [address] Phone: [phone]   Fax:  [phone]  Name: Seneca Hoback. MRN: [id number] Date of Birth: [birth year]

## 2020-09-14 NOTE — Therapy (Signed)
[facility] [address] [place], [place], [address] Phone: [phone]   Fax:  [phone]  Physical Therapy Treatment  Patient Details  Name: [name]. MRN: [id number] Date of Birth: 04-08-1971 Referring Provider (PT): Miguel Aschoff, MD   Encounter Date: 09/14/2020   PT End of Session - 09/14/20 1501    Visit Number 4    Number of Visits 13    Authorization Type UHC    PT Start Time [phone]    PT Stop Time 0930    PT Time Calculation (min) 46 min    Activity Tolerance Patient tolerated treatment well    Behavior During Therapy [facility] for tasks assessed/performed   seems fatigued at times; eyes closed occasionally          Past Medical History:  Diagnosis Date  . Diabetes mellitus   . GERD (gastroesophageal reflux disease)   . History of alcohol abuse   . History of cocaine use   . History of echocardiogram    a. Echo 4/14: Moderate LVH, vigorous LVEF, EF 65-70%, normal wall motion, grade 2 diastolic dysfunction, mildly dilated aortic root and ascending aorta, ascending aorta 40 mm, aortic root 38 mm, mild LAE  . Hx of cardiovascular stress test    a. GXT 5/14: No ischemic changes  //  b. ETT-Myoview 3/16:  Low risk, no ischemia, EF 58%  . Hyperlipidemia   . Hypertension   . Morbid obesity (HCC)   . Paroxysmal atrial fibrillation (HCC)    Occurring in 2008, with several recurrence since then (including in the setting of + cocaine on UDS).  . Sleep apnea    uses CPAP  . SVT (supraventricular tachycardia) (HCC)    mid to long RP SVT 09/2019    Past Surgical History:  Procedure Laterality Date  . APPENDECTOMY    . ATRIAL FIBRILLATION ABLATION N/A 11/27/2019   Procedure: ATRIAL FIBRILLATION ABLATION;  Surgeon: [name], MD;  [facility]: [facility];  Service: Cardiovascular;  Laterality: N/A;  . ROTATOR CUFF REPAIR    . SVT ABLATION N/A 11/27/2019   Procedure: SVT ABLATION;   Surgeon: [name], MD;  Location: [facility];  Service: Cardiovascular;  Laterality: N/A;  . TONSILLECTOMY      Vitals:   09/14/20 0849  BP: 140/85  Pulse: 75     Subjective Assessment - 09/14/20 0845    Subjective I fell in the grass, trying to mow the grass, the dip in the yard made me fall.  Been taking my medicine; haven't been doing my exercises.    Patient Stated Goals Pt's goals for therapy are to get back to where I was with strength and coordination.    Currently in Pain? No/denies                             [facility] Adult PT Treatment/Exercise - 09/14/20 0001      Exercises   Exercises Knee/Hip      Knee/Hip Exercises: Aerobic   Stepper Seated Stepper, Level 1.4>2, 4 extremities x 6 minutes, cues initially for RPM> 50, but pt keeps RPM >80; performed for lower extremity strengthening            Neuro Re-education: Reviewed HEP from last visit, with pt return demo understanding (but reports he is not doing at home).  Cued pt to use UE support as needed for optimal stability.  -  heel walking x 3 reps along counter -toe walking x 3 reps along counter -tandem gait forward/back along counter x 3 reps -Single limb stance x 3 reps, 10 sec -Tandem stance x 3 reps, 30 sec      Balance Exercises - 09/14/20 0901      Balance Exercises: Standing   Standing Eyes Opened Wide (BOA);Narrow base of support (BOS);Foam/compliant surface;Limitations    Standing Eyes Opened Limitations Head turns, head nods x 10 reps each; standing on 2 pillows    Standing Eyes Closed Narrow base of support (BOS);Wide (BOA);Foam/compliant surface;Limitations    Standing Eyes Closed Limitations Head turns/head nods x 5-10 reps each, intermittent UE support; on 2 pillows    Wall Bumps Hip;Eyes opened;Eyes closed;10 reps;Limitations    Wall Bumps Limitations on compliant (2 pillows) surface, x 10 reps each EO/EC    Marching Static;10 reps;Limitations;Foam/compliant  surface    Marching Limitations Marching in place x 10 on 2 pillows in corner    Heel Raises 10 reps;Both;Limitations   compliant surface   Heel Raises Limitations 2 sets; 2nd set EC    Toe Raise 10 reps;Both;Limitations   compliant surface   Toe Raise Limitations 2 sets; 2nd set EC    Other Standing Exercises Short distance forward gait on red/blue mat surfaces x 6 reps, cues for increased foot clearance, then forward marching x 4 reps with min guard assist              PT Education - 09/14/20 1501    Education Details Updates to HEP    Person(s) Educated Patient    Methods Explanation;Demonstration;Handout;Verbal cues    Comprehension Verbalized understanding;Returned demonstration            PT Short Term Goals - 09/06/20 0957      PT SHORT TERM GOAL #1   Title Pt will be independent with HEP for improved balance and gait.  TARGET 10/01/2020    Time 4    Period Weeks    Status New      PT SHORT TERM GOAL #2   Title Pt to improve by at least 75 ft for improved gait endurance and efficiency.    Baseline 995 ft    Time 4    Period Weeks    Status Revised      PT SHORT TERM GOAL #3   Title Sensory Organization Test to be assessed, with goal written as appropriate.    Time 4    Period Weeks    Status New      PT SHORT TERM GOAL #4   Title Pt will improve FGA score to at least 22/30 for decreased fall risk.    Baseline 19/30 at eval    Time 4    Period Weeks    Status New             PT Long Term Goals - 09/02/20 1513      PT LONG TERM GOAL #1   Title Pt will be independent with progression of HEP for improved gait and balance.  TARGET 10/15/2020    Time 6    Period Weeks    Status New      PT LONG TERM GOAL #2   Title Pt will negotiate 4 steps with 1 handrail, alternating pattern, modified independently.    Time 6    Period Weeks    Status New      PT LONG TERM GOAL #3   Title Pt will  verbalize understanding of fall prevention in home  environment.    Time 6    Period Weeks    Status New      PT LONG TERM GOAL #4   Title Pt will ambulate at least 1000 ft, indoors and outdoors surfaces, independently, no LOB, for improved community ambulation.    Time 6    Period Weeks    Status New                 Plan - 09/14/20 1502    Clinical Impression Statement Pt's BP more WNL today and appropriate to proceed with balance exercises.  Reviewed and upgraded HEP to address compliant surface balance, as pt reports he fell on outdoor ground.  Pt requires intermittent UE support for [facility] corner balance exercises for optimal stability.  Will conitnue to benefit from skilled PT to work towards goals for improved overall mobility and decreased fall risk.    Personal Factors and Comorbidities Comorbidity 3+    Comorbidities diabetes, HTN, morbid obesity, history of polysubstance abuse (cocaine and alcohol)    Examination-Activity Limitations Locomotion Level;Transfers;Stairs    Examination-Participation Restrictions Community Activity;Yard Work    Conservation officer, historic buildings Evolving/Moderate complexity    Rehab Potential Good    PT Frequency 2x / week    PT Duration 6 weeks   plus eval   PT Treatment/Interventions ADLs/Self Care Home Management;Gait training;Stair training;Functional mobility training;Therapeutic activities;Therapeutic exercise;Balance training;Neuromuscular re-education;Patient/family education    PT Next Visit Plan Review HEP additions; continue to work on compliant surface and corner balance exercises; dynamic gait and balance on compliant surfaces;  per pt request, work on floor>stand transfer, general lower extremity strengthening for return to work activities    Consulted and Agree with Plan of Care Patient           Patient will benefit from skilled therapeutic intervention in order to improve the following deficits and impairments:  Abnormal gait, Difficulty walking, Decreased balance, Decreased  mobility  Visit Diagnosis: Unsteadiness on feet  Other abnormalities of gait and mobility     Problem List Patient Active Problem List   Diagnosis Date Noted  . Amnesia 09/08/2020  . 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 Acute cerebrovascular accident (CVA) (HCC) 06/01/2020  . Paronychia of right thumb 05/10/2020  . Mediastinal adenopathy 01/06/2020  . Secondary hypercoagulable state (HCC) 10/22/2019  . Acute pancreatitis 01/22/2019  . Class 3 obesity due to excess calories with serious comorbidity and body mass index (BMI) of 40.0 to 44.9 in adult 12/08/2016  . Paroxysmal atrial fibrillation (HCC) 01/23/2013  . Diabetes mellitus (HCC) 01/23/2013  . Hypertension 01/23/2013  . Tobacco user 01/23/2013  . Chronic diastolic heart failure (HCC) 01/21/2013  . 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 Shoulder pain, right 02/15/2011  . Seasonal and perennial allergic rhinitis 02/13/2008  . Dyslipidemia 02/12/2008  . Cocaine abuse (HCC) 02/12/2008  . Obstructive sleep apnea 02/12/2008    Brylee Mcgreal W. 09/14/2020, 3:05 PM  Gean Maidens., PT   [facility] [address] [place], [place], [address] Phone: [phone]   Fax:  [phone]  Name: [name]. MRN: [id number] Date of Birth: 1971-06-23

## 2020-09-14 NOTE — Patient Instructions (Signed)
Access Code: [email or web address] URL: [email or web address] Date: 09/14/2020 Prepared by: Lonia Blood  Exercises Standing Balance in Corner - 1 x daily - 5 x weekly - 1 sets - 5-10 reps (on compliant surface)  -feet apart EO head turns/head nods x 10, then EC head turns/head nods x 10  -marching in place x 10  -heel/toe raises x 10 Romberg Stance on Foam Pad - 1 x daily - 5 x weekly - 1-2 sets - 10 reps  -feet together EO head turns/nods x 10; then EC head turns/head nods x 10

## 2020-09-14 NOTE — Patient Instructions (Signed)
  Coordination Activities  Perform the following activities for 10 minutes 2 times per day with left hand(s).   Rotate tennis ball in fingertips (clockwise and counter-clockwise).  Toss bean bag or hacky sack ball in air and catch with the same hand.  Flip cards 1 at a time as fast as you can.  Deal cards with your thumb (Hold deck in hand and push card off top with thumb).  Rotate one card around in hand clockwise and counterclockwise  Pick up coins one at a time until you get 5 in your hand, then move coins from palm to fingertips to stack one at a time.  Screw together nuts and bolts, then unfasten.

## 2020-09-16 ENCOUNTER — Ambulatory Visit: Payer: 59 | Admitting: Occupational Therapy

## 2020-09-16 ENCOUNTER — Ambulatory Visit: Payer: 59 | Attending: Internal Medicine | Admitting: Physical Therapy

## 2020-09-16 ENCOUNTER — Other Ambulatory Visit: Payer: Self-pay

## 2020-09-16 VITALS — BP 164/88 | HR 80

## 2020-09-16 DIAGNOSIS — R2681 Unsteadiness on feet: Secondary | ICD-10-CM | POA: Insufficient documentation

## 2020-09-16 DIAGNOSIS — I69318 Other symptoms and signs involving cognitive functions following cerebral infarction: Secondary | ICD-10-CM | POA: Insufficient documentation

## 2020-09-16 DIAGNOSIS — R2689 Other abnormalities of gait and mobility: Secondary | ICD-10-CM | POA: Insufficient documentation

## 2020-09-16 DIAGNOSIS — F8089 Other developmental disorders of speech and language: Secondary | ICD-10-CM | POA: Diagnosis present

## 2020-09-16 DIAGNOSIS — R41842 Visuospatial deficit: Secondary | ICD-10-CM | POA: Diagnosis present

## 2020-09-16 DIAGNOSIS — R1310 Dysphagia, unspecified: Secondary | ICD-10-CM | POA: Diagnosis present

## 2020-09-16 DIAGNOSIS — R278 Other lack of coordination: Secondary | ICD-10-CM

## 2020-09-16 DIAGNOSIS — R41841 Cognitive communication deficit: Secondary | ICD-10-CM | POA: Insufficient documentation

## 2020-09-16 DIAGNOSIS — R471 Dysarthria and anarthria: Secondary | ICD-10-CM | POA: Diagnosis present

## 2020-09-16 NOTE — Patient Instructions (Signed)
VISUAL SCANNING STRATEGIES  1. Look for the edge of objects (to the left and/or right) so that you make sure you are seeing all of an object 2. Turn your head when walking, scan from side to side, particularly in busy environments 3. Use an organized scanning pattern. It's usually easier to scan from top to bottom, and left to right (like you are reading) 4. Double check yourself 5. Use a line guide (like a blank piece of paper) or your finger when reading 6. If necessary, place brightly colored tape at end of table or work area as a reminder to always look until you see the tape.    Activities to try at home to encourage visual scanning:   1. Word searches 2. Mazes 3. Puzzles 4. Card games 5. Computer games and/or searches 6. Connect-the-dots  Activities for environmental (larger) scanning:  1. With supervision, scan for items in grocery store or drugstore.  Begin with a familiar store, then progress to a new store you've never been in before. Make sure you have supervision with this.  2. With supervision, tell a family member or caregiver when it is safe to cross a street after looking all directions and any side streets. However, do NOT cross street unless family member or caregiver is with you and says it is OK   Memory Compensation Strategies  1. Use WARM strategy. W= write it down A=  associate it R=  repeat it M=  make a mental picture  2. You can keep a Glass blower/designer. Use a 3-ring notebook with sections for the following:  calendar, important names and phone numbers, medications, doctors names/phone numbers, to do list/reminders, and a section to journal what you did each day  3. Use a calendar to write appointments down.  4. Write yourself a schedule for the day.  This can be placed on the calendar or in a separate section of the Memory Notebook.  Keeping a regular schedule can help memory.  5. Use medication organizer with sections for each day or  morning/evening pills  You may need help loading it  6. Keep a basket, or pegboard by the door.   Place items that you need to take out with you in the basket or on the pegboard.  You may also want to include a message board for reminders.  7. Use sticky notes. Place sticky notes with reminders in a place where the task is performed.  For example:  turn off the stove placed by the stove, lock the door placed on the door at eye level, take your medications on the bathroom mirror or by the place where you normally take your medications  8. Use alarms/timers.  Use while cooking to remind yourself to check on food or as a reminder to take your medicine, or as a reminder to make a call, or as a reminder to perform another task, etc.  9. Use a small tape recorder to record important information and notes for yourself.

## 2020-09-16 NOTE — Therapy (Signed)
[facility] [address] [place], [place], [address] Phone: [phone]   Fax:  [phone]  Occupational Therapy Treatment  Patient Details  Name: [name]. MRN: [id number] Date of Birth: December 28, 1970 No data recorded  Encounter Date: 09/16/2020   OT End of Session - 09/16/20 0914    Visit Number 3    Number of Visits 12    Date for OT Re-Evaluation 10/15/20    Authorization Type UHC    OT Start Time 0850    OT Stop Time 0930    OT Time Calculation (min) 40 min    Activity Tolerance Patient tolerated treatment well;Patient limited by lethargy    Behavior During Therapy [facility] for tasks assessed/performed;Restless           Past Medical History:  Diagnosis Date  . Diabetes mellitus   . GERD (gastroesophageal reflux disease)   . History of alcohol abuse   . History of cocaine use   . History of echocardiogram    a. Echo 4/14: Moderate LVH, vigorous LVEF, EF 65-70%, normal wall motion, grade 2 diastolic dysfunction, mildly dilated aortic root and ascending aorta, ascending aorta 40 mm, aortic root 38 mm, mild LAE  . Hx of cardiovascular stress test    a. GXT 5/14: No ischemic changes  //  b. ETT-Myoview 3/16:  Low risk, no ischemia, EF 58%  . Hyperlipidemia   . Hypertension   . Morbid obesity (HCC)   . Paroxysmal atrial fibrillation (HCC)    Occurring in 2008, with several recurrence since then (including in the setting of + cocaine on UDS).  . Sleep apnea    uses CPAP  . SVT (supraventricular tachycardia) (HCC)    mid to long RP SVT 09/2019    Past Surgical History:  Procedure Laterality Date  . APPENDECTOMY    . ATRIAL FIBRILLATION ABLATION N/A 11/27/2019   Procedure: ATRIAL FIBRILLATION ABLATION;  Surgeon: [name], MD;  [facility]: [facility];  Service: Cardiovascular;  Laterality: N/A;  . ROTATOR CUFF REPAIR    . SVT ABLATION N/A 11/27/2019   Procedure: SVT ABLATION;  Surgeon:  [name], MD;  Location: [facility] CV LAB;  Service: Cardiovascular;  Laterality: N/A;  . TONSILLECTOMY      There were no vitals filed for this visit.   Subjective Assessment - 09/16/20 0854    Pertinent History CVA 06/01/20. PMH: HTN, DM, sleep apnea, ETOH and cocaine abuse    Limitations no heavy lifting    Currently in Pain? No/denies           Verbally reviewed coordination HEP - pt had no further questions.  Issued visual scanning strategies and memory strategies and discussed/reviewed.  Copying simple design using medium sized pegs with mod cueing needed to use Lt hand and to correctly copy design and correct mistakes.  Counting # of times double #'s appear on each line (90M print size) with 90% accuracy after initial demo                     OT Education - 09/16/20 0857    Education Details Visual strategies, memory strategies    Person(s) Educated Patient    Methods Explanation;Handout    Comprehension Verbalized understanding               OT Long Term Goals - 09/16/20 0915      OT LONG TERM GOAL #1   Title Independent with coordination  HEP    Time 6    Period Weeks    Status Achieved      OT LONG TERM GOAL #2   Title Improve coordination Lt hand as evidenced by reducing speed on 9 hole peg test to 35 sec or less    Time 6    Period Weeks    Status On-going      OT LONG TERM GOAL #3   Title Pt to perform tabletop scanning at 90% or greater accuracy    Time 6    Period Weeks    Status On-going      OT LONG TERM GOAL #4   Title Pt to perform environmental scanning at 85% accuracy or greater while performing simple physical task and without bumping into objects/furniture/walls    Time 6    Period Weeks    Status On-going      OT LONG TERM GOAL #5   Title Pt to perform simulated work tasks safely    Time 6    Period Weeks    Status New      OT LONG TERM GOAL #6   Title Pt to attend to task for 15 minutes without  distraction and remain alert    Time 6    Period Weeks    Status New                 Plan - 09/16/20 0916    Clinical Impression Statement Pt requires multiple cues to use Lt hand and to follow visual design correctly    OT Occupational Profile and History Problem Focused Assessment - Including review of records relating to presenting problem    Occupational performance deficits (Please refer to evaluation for details): IADL's;Work    Body Structure / Function / Physical Skills IADL;Coordination;FMC;Decreased knowledge of precautions;UE functional use    Cognitive Skills Attention;Safety Awareness    Rehab Potential Good    Clinical Decision Making Several treatment options, min-mod task modification necessary    Comorbidities Affecting Occupational Performance: May have comorbidities impacting occupational performance    Modification or Assistance to Complete Evaluation  No modification of tasks or assist necessary to complete eval    OT Frequency 2x / week    OT Duration 6 weeks   or 12 visits over extended weeks (d/t potential scheduling conflicts)   OT Treatment/Interventions Therapeutic activities;Therapeutic exercise;Cognitive remediation/compensation;Neuromuscular education;Functional Mobility Training;Visual/perceptual remediation/compensation;Patient/family education    Plan continue visual scanning (tabletop and environmental) and coordination Lt hand    Consulted and Agree with Plan of Care Patient           Patient will benefit from skilled therapeutic intervention in order to improve the following deficits and impairments:   Body Structure / Function / Physical Skills: IADL, Coordination, FMC, Decreased knowledge of precautions, UE functional use Cognitive Skills: Attention, Safety Awareness     Visit Diagnosis: Other lack of coordination  Visuospatial deficit    Problem List Patient Active Problem List   Diagnosis Date Noted  . Amnesia 09/08/2020  .  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 Acute cerebrovascular accident (CVA) (HCC) 06/01/2020  . Paronychia of right thumb 05/10/2020  . Mediastinal adenopathy 01/06/2020  . Secondary hypercoagulable state (HCC) 10/22/2019  . Acute pancreatitis 01/22/2019  . Class 3 obesity due to excess calories with serious comorbidity and body mass index (BMI) of 40.0 to 44.9 in adult 12/08/2016  . Paroxysmal atrial fibrillation (HCC) 01/23/2013  . Diabetes mellitus (HCC) 01/23/2013  .  Hypertension 01/23/2013  . Tobacco user 01/23/2013  . Chronic diastolic heart failure (HCC) 01/21/2013  . 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 Shoulder pain, right 02/15/2011  . Seasonal and perennial allergic rhinitis 02/13/2008  . Dyslipidemia 02/12/2008  . Cocaine abuse (HCC) 02/12/2008  . Obstructive sleep apnea 02/12/2008    Kelli Churn, OTR/L 09/16/2020, 9:21 AM  [facility] [address] [place], [place], [address] Phone: [phone]   Fax:  [phone]  Name: Erastus Bartolomei. MRN: [id number] Date of Birth: 06-12-1971

## 2020-09-16 NOTE — Therapy (Addendum)
[facility] [address] [place], [place], [address] Phone: [phone]   Fax:  [phone]  Physical Therapy Treatment  Patient Details  Name: [name]. MRN: [id number] Date of Birth: 06/24/1971 Referring Provider (PT): Miguel Aschoff, MD   Encounter Date: 09/16/2020   PT End of Session - 09/16/20 0808    Visit Number 6    Number of Visits 13    Authorization Type UHC    PT Start Time 0805    PT Stop Time 0845    PT Time Calculation (min) 40 min    Activity Tolerance Patient tolerated treatment well    Behavior During Therapy [facility] for tasks assessed/performed   seems fatigued at times; eyes closed occasionally          Past Medical History:  Diagnosis Date  . Diabetes mellitus   . GERD (gastroesophageal reflux disease)   . History of alcohol abuse   . History of cocaine use   . History of echocardiogram    a. Echo 4/14: Moderate LVH, vigorous LVEF, EF 65-70%, normal wall motion, grade 2 diastolic dysfunction, mildly dilated aortic root and ascending aorta, ascending aorta 40 mm, aortic root 38 mm, mild LAE  . Hx of cardiovascular stress test    a. GXT 5/14: No ischemic changes  //  b. ETT-Myoview 3/16:  Low risk, no ischemia, EF 58%  . Hyperlipidemia   . Hypertension   . Morbid obesity (HCC)   . Paroxysmal atrial fibrillation (HCC)    Occurring in 2008, with several recurrence since then (including in the setting of + cocaine on UDS).  . Sleep apnea    uses CPAP  . SVT (supraventricular tachycardia) (HCC)    mid to long RP SVT 09/2019    Past Surgical History:  Procedure Laterality Date  . APPENDECTOMY    . ATRIAL FIBRILLATION ABLATION N/A 11/27/2019   Procedure: ATRIAL FIBRILLATION ABLATION;  Surgeon: [name], MD;  [facility];  Service: Cardiovascular;  Laterality: N/A;  . ROTATOR CUFF REPAIR    . SVT ABLATION N/A 11/27/2019   Procedure: SVT ABLATION;   Surgeon: [name], MD;  [facility]: [facility];  Service: Cardiovascular;  Laterality: N/A;  . TONSILLECTOMY      Vitals:   09/16/20 0809  BP: (!) 164/88  Pulse: 80     Subjective Assessment - 09/16/20 0806    Subjective No changes, no more falls.    Patient Stated Goals Pt's goals for therapy are to get back to where I was with strength and coordination.    Currently in Pain? No/denies                             Neuro Re-education: Reviewed HEP from last visit, with pt return demo understanding:  Standing Balance in Corner - 1 x daily - 5 x weekly - 1 sets - 5-10 reps (on compliant surface)             -feet apart EO head turns/head nods x 10, then EC head turns/head nods x 10             -marching in place x 10             -heel/toe raises x 10 Romberg Stance on Foam Pad - 1 x daily - 5 x weekly - 1-2 sets - 10 reps             -  feet together EO head turns/nods x 10; then EC head turns/head nods x 10        Balance Exercises - 09/16/20 0001      Balance Exercises: Standing   Balance Beam Heel/toe raises x 10 reps; forward step taps to floor, return to beam, alternating legs x 10 reps; then forward<>back step and weightshift without stopping in the middle, 10 reps each side for improved SLS.  Minisquats on balance beam    Tandem Gait Forward;Retro;Upper extremity support;Limitations;3 reps    Tandem Gait Limitations In parallel bars on balance beam    Partial Tandem Stance Eyes open;Eyes closed;Foam/compliant surface;Upper extremity support 2;Other reps (comment);Limitations    Partial Tandem Stance Limitations Head turns, head nods x 10 EO; EC head steady 30 sec    Sidestepping Limitations;3 reps    Sidestepping Limitations In parallel bars on balance beam, then 3rd rep with head turns    Marching Foam/compliant surface;Upper extremity assist 2;Static;Head turns;10 reps   2 sets   Marching Limitations on balance beam, marching in place; then  forward/back marching on blue/red mats, 4 reps.    Heel Raises --    Toe Raise --    Other Standing Exercises Short distance forward/back gait on red/blue mat surfaces x 3 reps, cues for increased foot clearance, the sidestepping R and left on mats x 2, with addition of head turns           Therapeutic Activity: Floor>stand transfer, with UE support, pt able to transition side sit on floor>tall kneeling>1/2 kneel to stand with UE support at mat table with supervision.  He reports at home he has to get down on floor to look for things and it is not that easy.  Educated pt on use of UE support as able and will continue to work on 1/2 kneel to stand and tall>1/2 kneel components for improved ease of transfer.    PT Short Term Goals - 09/06/20 0957      PT SHORT TERM GOAL #1   Title Pt will be independent with HEP for improved balance and gait.  TARGET 10/01/2020    Time 4    Period Weeks    Status New      PT SHORT TERM GOAL #2   Title Pt to improve by at least 75 ft for improved gait endurance and efficiency.    Baseline 995 ft    Time 4    Period Weeks    Status Revised      PT SHORT TERM GOAL #3   Title Sensory Organization Test to be assessed, with goal written as appropriate.    Time 4    Period Weeks    Status New      PT SHORT TERM GOAL #4   Title Pt will improve FGA score to at least 22/30 for decreased fall risk.    Baseline 19/30 at eval    Time 4    Period Weeks    Status New             PT Long Term Goals - 09/02/20 1513      PT LONG TERM GOAL #1   Title Pt will be independent with progression of HEP for improved gait and balance.  TARGET 10/15/2020    Time 6    Period Weeks    Status New      PT LONG TERM GOAL #2   Title Pt will negotiate 4 steps with 1 handrail, alternating pattern,  modified independently.    Time 6    Period Weeks    Status New      PT LONG TERM GOAL #3   Title Pt will verbalize understanding of fall prevention in home  environment.    Time 6    Period Weeks    Status New      PT LONG TERM GOAL #4   Title Pt will ambulate at least 1000 ft, indoors and outdoors surfaces, independently, no LOB, for improved community ambulation.    Time 6    Period Weeks    Status New                 Plan - 09/16/20 7253    Clinical Impression Statement Reviewed HEP, with pt return demo understanding.  Focused on compliant surface exercises at corner and in parallel bars; pt needs intermittent UE support for EC on compliant surface; with eyes open compliant surface dynamic activities, he is able to progress from BUE to 1 UE support at times.  He will continue to benefit from skilled PT to further address balance, strength and functional mobility.    Personal Factors and Comorbidities Comorbidity 3+    Comorbidities diabetes, HTN, morbid obesity, history of polysubstance abuse (cocaine and alcohol)    Examination-Activity Limitations Locomotion Level;Transfers;Stairs    Examination-Participation Restrictions Community Activity;Yard Work    Conservation officer, historic buildings Evolving/Moderate complexity    Rehab Potential Good    PT Frequency 2x / week    PT Duration 6 weeks   plus eval   PT Treatment/Interventions ADLs/Self Care Home Management;Gait training;Stair training;Functional mobility training;Therapeutic activities;Therapeutic exercise;Balance training;Neuromuscular re-education;Patient/family education    PT Next Visit Plan Review HEP as needed and progress; continue to work on compliant surface and corner balance exercises; dynamic gait and balance on compliant surfaces;  per pt request, work on floor>stand transfer, general lower extremity strengthening for return to work activities (work on tall>1/2 kneel 1/2 kneel to stand components)    Consulted and Agree with Plan of Care Patient           Patient will benefit from skilled therapeutic intervention in order to improve the following deficits and  impairments:  Abnormal gait, Difficulty walking, Decreased balance, Decreased mobility  Visit Diagnosis: Unsteadiness on feet     Problem List Patient Active Problem List   Diagnosis Date Noted  . Amnesia 09/08/2020  . 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 Acute cerebrovascular accident (CVA) (HCC) 06/01/2020  . Paronychia of right thumb 05/10/2020  . Mediastinal adenopathy 01/06/2020  . Secondary hypercoagulable state (HCC) 10/22/2019  . Acute pancreatitis 01/22/2019  . Class 3 obesity due to excess calories with serious comorbidity and body mass index (BMI) of 40.0 to 44.9 in adult 12/08/2016  . Paroxysmal atrial fibrillation (HCC) 01/23/2013  . Diabetes mellitus (HCC) 01/23/2013  . Hypertension 01/23/2013  . Tobacco user 01/23/2013  . Chronic diastolic heart failure (HCC) 01/21/2013  . 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 Shoulder pain, right 02/15/2011  . Seasonal and perennial allergic rhinitis 02/13/2008  . Dyslipidemia 02/12/2008  . Cocaine abuse (HCC) 02/12/2008  . Obstructive sleep apnea 02/12/2008    Jeliyah Middlebrooks W. 09/16/2020, 9:51 AM  Lonia Blood, PT 09/16/20 9:53 AM Phone: [phone] Fax: [phone]    [facility] [address] [place], [place], [address] Phone: [phone]   Fax:  [phone]  Name: Lenell Lama. MRN: [id number] Date of Birth: May 29, 1971

## 2020-09-20 ENCOUNTER — Telehealth: Payer: Self-pay | Admitting: Internal Medicine

## 2020-09-20 NOTE — Telephone Encounter (Signed)
Received faxed refill request from--pt called for refill  Medication name/strength/dose: Adderall 30 mg Medication last rx'd: 08/16/2020 Quantity and number of refills last rx'd: 30 with no refills Instructions: take one daily as needed  Last OV: 05/10/2020 Next OV: 10/25/2020  CY please advise on refill request  Allergies  Allergen Reactions  . Shrimp [Shellfish Allergy] Anaphylaxis  . Banana Other (See Comments)    Pt gags  . Watermelon Flavor Nausea And Vomiting  . Peanut-Containing Drug Products Itching and Cough  . Almond Oil Itching    Roof of mouth itches  . Sulfa Antibiotics Itching   Current Outpatient Medications on File Prior to Visit  Medication Sig Dispense Refill  . amphetamine-dextroamphetamine (ADDERALL XR) 30 MG 24 hr capsule Take 1 capsule (30 mg total) by mouth daily as needed (focus). 1 daily as needed 30 capsule 0  . atorvastatin (LIPITOR) 40 MG tablet Take 1 tablet (40 mg total) by mouth daily. 30 tablet 1  . Blood Pressure Monitoring (BLOOD PRESSURE MONITOR/L CUFF) MISC 1 Units by Does not apply route daily. (Patient not taking: Reported on 09/02/2020) 1 each 0  . diltiazem (CARDIZEM) 30 MG tablet Take 1 Tablet Every 4 Hours As Needed For HR >100 (Patient taking differently: Take 30 mg by mouth every 4 (four) hours as needed. For HR >100) 45 tablet 2  . Dulaglutide (TRULICITY) 0.75 MG/0.5ML SOPN Inject 0.75 mg into the skin once a week. (Patient not taking: Reported on 09/02/2020) 2 mL 5  . flecainide (TAMBOCOR) 100 MG tablet Take 1 tablet (100 mg total) by mouth 2 (two) times daily. 180 tablet 3  . glipiZIDE (GLUCOTROL) 10 MG tablet Take 10 mg by mouth 2 (two) times daily.    Marland Kitchen lisinopril (ZESTRIL) 40 MG tablet Take 40 mg by mouth daily.    . metFORMIN (GLUCOPHAGE) 500 MG tablet Take 1,000 mg by mouth 2 (two) times daily with a meal.     . metoprolol succinate (TOPROL-XL) 50 MG 24 hr tablet Take 1 tablet (50 mg total) by mouth daily. Take with or immediately  following a meal. 90 tablet 3  . ondansetron (ZOFRAN ODT) 8 MG disintegrating tablet Take 1 tablet (8 mg total) by mouth every 8 (eight) hours as needed for nausea or vomiting. (Patient not taking: Reported on 09/02/2020) 12 tablet 0  . pantoprazole (PROTONIX) 40 MG tablet Take 1 tablet (40 mg total) by mouth daily. 30 tablet 5  . spironolactone (ALDACTONE) 25 MG tablet Take 1 tablet (25 mg total) by mouth daily. 30 tablet 0  . XARELTO 20 MG TABS tablet TAKE 1 TABLET (20 MG TOTAL) BY MOUTH DAILY WITH SUPPER. (Patient taking differently: Take 20 mg by mouth daily with supper. ) 30 tablet 6   No current facility-administered medications on file prior to visit.

## 2020-09-21 MED ORDER — AMPHETAMINE-DEXTROAMPHET ER 30 MG PO CP24: 30.0000 mg | ORAL_CAPSULE | Freq: Every day | ORAL | 0 refills | Status: DC | PRN

## 2020-09-21 NOTE — Telephone Encounter (Signed)
Adderall sent to his [facility]

## 2020-09-22 ENCOUNTER — Ambulatory Visit: Payer: 59 | Admitting: Occupational Therapy

## 2020-09-22 ENCOUNTER — Ambulatory Visit: Payer: 59 | Admitting: Physical Therapy

## 2020-09-24 ENCOUNTER — Ambulatory Visit: Payer: 59 | Admitting: Occupational Therapy

## 2020-09-24 ENCOUNTER — Ambulatory Visit: Payer: 59 | Admitting: Physical Therapy

## 2020-09-27 ENCOUNTER — Ambulatory Visit (INDEPENDENT_AMBULATORY_CARE_PROVIDER_SITE_OTHER): Payer: 59 | Admitting: Student

## 2020-09-27 ENCOUNTER — Other Ambulatory Visit: Payer: Self-pay

## 2020-09-27 ENCOUNTER — Inpatient Hospital Stay: Payer: Self-pay

## 2020-09-27 ENCOUNTER — Encounter: Payer: Self-pay | Admitting: Student

## 2020-09-27 VITALS — BP 148/75 | HR 73 | Temp 98.0°F | Ht 69.0 in | Wt 302.0 lb

## 2020-09-27 DIAGNOSIS — I10 Essential (primary) hypertension: Secondary | ICD-10-CM

## 2020-09-27 DIAGNOSIS — E1165 Type 2 diabetes mellitus with hyperglycemia: Secondary | ICD-10-CM | POA: Diagnosis not present

## 2020-09-27 NOTE — Progress Notes (Signed)
   CC: hypertension follow-up  HPI:  [name] is a 49 y.o. HFpEF, type II diabetes mellitus, hypertension, GERD, paroxysmal atrial fibrillation presenting to clinic for follow-up on recent medication changes for hypertension.  Please see problem-based list for further details.  Past Medical History:  Diagnosis Date  . Diabetes mellitus   . GERD (gastroesophageal reflux disease)   . History of alcohol abuse   . History of cocaine use   . History of echocardiogram    a. Echo 4/14: Moderate LVH, vigorous LVEF, EF 65-70%, normal wall motion, grade 2 diastolic dysfunction, mildly dilated aortic root and ascending aorta, ascending aorta 40 mm, aortic root 38 mm, mild LAE  . Hx of cardiovascular stress test    a. GXT 5/14: No ischemic changes  //  b. ETT-Myoview 3/16:  Low risk, no ischemia, EF 58%  . Hyperlipidemia   . Hypertension   . Morbid obesity (HCC)   . Paroxysmal atrial fibrillation (HCC)    Occurring in 2008, with several recurrence since then (including in the setting of + cocaine on UDS).  . Sleep apnea    uses CPAP  . SVT (supraventricular tachycardia) (HCC)    mid to long RP SVT 09/2019   Review of Systems:  As per HPI.  Physical Exam:  Vitals:   09/27/20 0836 09/27/20 0916  BP: (!) 151/78 (!) 148/75  Pulse: 73   Temp: 98 F (36.7 C)   TempSrc: Oral   SpO2: 100%   Weight: (!) 302 lb (137 kg)   Height: 5\' 9"  (1.753 m)    General: Pleasant, sitting in chair, no acute distress CV: Regular rate, rhythm. No murmurs, rubs, gallops Pulm: Clear to auscultation bilaterally. No wheezing, rales, rhonchi. GI: Abdomen soft, non-distended, non-tender. Normoactive bowel sounds. MSK: No pitting edema bilaterally. Normal bulk, tone throughout.  Assessment & Plan:   See Encounters Tab for problem based charting.  Patient seen with Dr. 

## 2020-09-27 NOTE — Assessment & Plan Note (Addendum)
Will re-check A1c today as well. Once resulted will call patient and discuss next steps.  A/P: - A1c today - Continue with metformin, Trulicity, glipizide

## 2020-09-27 NOTE — Patient Instructions (Signed)
Mr. Cato,  It was a pleasure seeing you today!  Today we discussed your blood pressure. It is still elevated from where we would like it to be. We are getting some lab work today then we will determine our next steps. I will call you tomorrow with the results and which medication you should increase. We are also getting an ultrasound of your kidneys.   If you have any prescriptions that need re-fills, you can call us or the pharmacy!  We look forward to seeing you next time. Please call our clinic at [phone] if you have any questions or concerns. The best time to call is Monday-Friday from 9am-4pm, but there is someone available 24/7 at the same number. If you need medication refills, please notify your pharmacy one week in advance and they will send [place] a request.  Thank you for letting us take part in your care. Wishing you the best!  Thank you, Dr. Evlyn Kanner, MD

## 2020-09-27 NOTE — Assessment & Plan Note (Addendum)
Patient presenting today for follow-up for hypertension. During last visit, patient was started on spironolactone and instructed to continue his lisinopril and metoprolol. Patient says he has taken each of these medications as prescribed. Denies headaches, blurry vision, chest pain, palpitations, dyspnea, abdominal pain, nausea, vomiting, leg swelling.  A/P: On arrival, BP 151/78. R/p after 10 minutes of sitting was 148/78. HR 70. Plan to obtain BMP today and titrate spironolactone vs metoprolol pending results. Given his age and medication burden, will also check labs and u/s for secondary hypertension  - Continue lisinopril 40mg  daily - BMP today - Aldosterone/renin today - Bilateral renal artery ultrasound ordered - If K normal, will increase spironolactone to 50mg  per day. If abnormal, will increase metoprolol. Discussed with patient that I will contact him tomorrow once BMP has resulted to discuss next steps. - Plan to re-check blood pressure and electrolytes in 1 month.  BP Readings from Last 3 Encounters:  09/27/20 (!) 148/75  09/16/20 (!) 164/88  09/14/20 140/85

## 2020-09-28 ENCOUNTER — Other Ambulatory Visit: Payer: Self-pay | Admitting: Student

## 2020-09-28 ENCOUNTER — Ambulatory Visit: Payer: 59 | Admitting: Occupational Therapy

## 2020-09-28 ENCOUNTER — Telehealth: Payer: Self-pay | Admitting: Physical Therapy

## 2020-09-28 ENCOUNTER — Ambulatory Visit: Payer: 59 | Admitting: Physical Therapy

## 2020-09-28 DIAGNOSIS — E875 Hyperkalemia: Secondary | ICD-10-CM

## 2020-09-28 LAB — BMP8+ANION GAP
Anion Gap: 10 mmol/L (ref 10.0–18.0)
BUN/Creatinine Ratio: 20 (ref 9–20)
BUN: 22 mg/dL (ref 6–24)
CO2: 24 mmol/L (ref 20–29)
Calcium: 9.5 mg/dL (ref 8.7–10.2)
Chloride: 105 mmol/L (ref 96–106)
Creatinine, Ser: 1.1 mg/dL (ref 0.76–1.27)
GFR calc Af Amer: 91 mL/min/{1.73_m2} (ref >59)
GFR calc non Af Amer: 78 mL/min/{1.73_m2} (ref >59)
Glucose: 152 mg/dL — ABNORMAL HIGH (ref 65–99)
Potassium: 5.9 mmol/L — ABNORMAL HIGH (ref 3.5–5.2)
Sodium: 139 mmol/L (ref 134–144)

## 2020-09-28 LAB — TSH: TSH: 1.83 u[IU]/mL (ref 0.450–4.500)

## 2020-09-28 MED ORDER — LOKELMA 10 G PO PACK: 10.0000 g | PACK | Freq: Two times a day (BID) | ORAL | 0 refills | Status: AC

## 2020-09-28 MED ORDER — METOPROLOL SUCCINATE ER 100 MG PO TB24: 100.0000 mg | ORAL_TABLET | Freq: Every day | ORAL | 11 refills | Status: DC

## 2020-09-28 NOTE — Addendum Note (Signed)
Addended byEvlyn Kanner on: 09/28/2020 12:17 PM   Modules accepted: Orders

## 2020-09-28 NOTE — Telephone Encounter (Signed)
Called patient, as he did not show for 8:45 PT appointment today.  He states he missed last appointment and today's appointment due to no transportation.  Reminded pt of his next scheduled appointment and let him know about transportation services through [employer].  He is interested in this, and PT let pt know to expect a call to work on setting up transportation for him.  Lonia Blood, PT 09/28/20 9:20 AM Phone: [phone] Fax: [phone]

## 2020-09-28 NOTE — Progress Notes (Signed)
Internal Medicine Clinic Attending  I saw and evaluated the patient.  I personally confirmed the key portions of the history and exam documented by Dr. Braswell and I reviewed pertinent patient test results.  The assessment, diagnosis, and plan were formulated together and I agree with the documentation in the resident's note.  

## 2020-09-29 NOTE — Progress Notes (Deleted)
May need to hold the spiro and monitor him off of it unless you plan to give him daily lokelma

## 2020-09-30 ENCOUNTER — Ambulatory Visit: Payer: 59 | Admitting: Occupational Therapy

## 2020-09-30 ENCOUNTER — Other Ambulatory Visit: Payer: Self-pay

## 2020-09-30 ENCOUNTER — Encounter: Payer: Self-pay | Admitting: Physical Therapy

## 2020-09-30 ENCOUNTER — Ambulatory Visit: Payer: 59 | Admitting: Physical Therapy

## 2020-09-30 DIAGNOSIS — R41842 Visuospatial deficit: Secondary | ICD-10-CM

## 2020-09-30 DIAGNOSIS — R2681 Unsteadiness on feet: Secondary | ICD-10-CM | POA: Diagnosis not present

## 2020-09-30 DIAGNOSIS — R278 Other lack of coordination: Secondary | ICD-10-CM

## 2020-09-30 DIAGNOSIS — R2689 Other abnormalities of gait and mobility: Secondary | ICD-10-CM

## 2020-09-30 NOTE — Therapy (Signed)
[facility] [address] [place], [place], [address] Phone: [phone]   Fax:  [phone]  Occupational Therapy Treatment  Patient Details  Name: [name]. MRN: [id number] Date of Birth: [birth year] No data recorded  Encounter Date: 09/30/2020   OT End of Session - 09/30/20 1038    Visit Number 4    Number of Visits 12    Date for OT Re-Evaluation 10/15/20    Authorization Type [employer]    OT Start Time 1020    OT Stop Time 1100    OT Time Calculation (min) 40 min    Activity Tolerance Patient tolerated treatment well;Patient limited by lethargy    Behavior During Therapy [facility] for tasks assessed/performed;Restless           Past Medical History:  Diagnosis Date  . Diabetes mellitus   . GERD (gastroesophageal reflux disease)   . History of alcohol abuse   . History of cocaine use   . History of echocardiogram    a. Echo 4/14: Moderate LVH, vigorous LVEF, EF 65-70%, normal wall motion, grade 2 diastolic dysfunction, mildly dilated aortic root and ascending aorta, ascending aorta 40 mm, aortic root 38 mm, mild LAE  . Hx of cardiovascular stress test    a. GXT 5/14: No ischemic changes  //  b. ETT-Myoview 3/16:  Low risk, no ischemia, EF 58%  . Hyperlipidemia   . Hypertension   . Morbid obesity (HCC)   . Paroxysmal atrial fibrillation (HCC)    Occurring in 2008, with several recurrence since then (including in the setting of + cocaine on UDS).  . Sleep apnea    uses CPAP  . SVT (supraventricular tachycardia) (HCC)    mid to long RP SVT 09/2019    Past Surgical History:  Procedure Laterality Date  . APPENDECTOMY    . ATRIAL FIBRILLATION ABLATION N/A 11/27/2019   Procedure: ATRIAL FIBRILLATION ABLATION;  Surgeon: [name], MD;  Location: [facility];  Service: Cardiovascular;  Laterality: N/A;  . ROTATOR CUFF REPAIR    . SVT ABLATION N/A 11/27/2019   Procedure: SVT ABLATION;  Surgeon:  [name], MD;  Location: [facility] INVASIVE CV LAB;  Service: Cardiovascular;  Laterality: N/A;  . TONSILLECTOMY      There were no vitals filed for this visit.   Subjective Assessment - 09/30/20 1019    Subjective  Glare bothers me. I decided to give up driving; someone else brought me today    Pertinent History CVA 06/01/20. PMH: HTN, DM, sleep apnea, ETOH and cocaine abuse    Limitations no heavy lifting    Currently in Pain? No/denies                        OT Treatments/Exercises (OP) - 09/30/20 1040      Exercises   Exercises Hand      Visual/Perceptual Exercises   Scanning - Environmental Pt found 12/13 items on first pass in familiar environment (busy gym) - however pt has done this similar path before    Scanning - Tabletop simple word search (33M print size) with extra time and max difficulty.      Fine Motor Coordination (Hand/Wrist)   Fine Motor Coordination Grooved pegs    Grooved pegs Pt placing grooved pegs in pegboard Lt hand with min to mod difficulty and occasional assist from Rt hand  OT Long Term Goals - 09/16/20 0915      OT LONG TERM GOAL #1   Title Independent with coordination HEP    Time 6    Period Weeks    Status Achieved      OT LONG TERM GOAL #2   Title Improve coordination Lt hand as evidenced by reducing speed on 9 hole peg test to 35 sec or less    Time 6    Period Weeks    Status On-going      OT LONG TERM GOAL #3   Title Pt to perform tabletop scanning at 90% or greater accuracy    Time 6    Period Weeks    Status On-going      OT LONG TERM GOAL #4   Title Pt to perform environmental scanning at 85% accuracy or greater while performing simple physical task and without bumping into objects/furniture/walls    Time 6    Period Weeks    Status On-going      OT LONG TERM GOAL #5   Title Pt to perform simulated work tasks safely    Time 6    Period Weeks    Status New      OT LONG TERM  GOAL #6   Title Pt to attend to task for 15 minutes without distraction and remain alert    Time 6    Period Weeks    Status New                 Plan - 09/30/20 1423    Clinical Impression Statement Pt with increased accuracy w/ environmental scanning with familiar path    OT Occupational Profile and History Problem Focused Assessment - Including review of records relating to presenting problem    Occupational performance deficits (Please refer to evaluation for details): IADL's;Work    Body Structure / Function / Physical Skills IADL;Coordination;FMC;Decreased knowledge of precautions;UE functional use    Cognitive Skills Attention;Safety Awareness    Rehab Potential Good    Clinical Decision Making Several treatment options, min-mod task modification necessary    Comorbidities Affecting Occupational Performance: May have comorbidities impacting occupational performance    Modification or Assistance to Complete Evaluation  No modification of tasks or assist necessary to complete eval    OT Frequency 2x / week    OT Duration 6 weeks   or 12 visits over extended weeks (d/t potential scheduling conflicts)   OT Treatment/Interventions Therapeutic activities;Therapeutic exercise;Cognitive remediation/compensation;Neuromuscular education;Functional Mobility Training;Visual/perceptual remediation/compensation;Patient/family education    Plan environmental scanning down hallway, continue coordination Lt hand, PVC pipe design    Consulted and Agree with Plan of Care Patient           Patient will benefit from skilled therapeutic intervention in order to improve the following deficits and impairments:   Body Structure / Function / Physical Skills: IADL,Coordination,FMC,Decreased knowledge of precautions,UE functional use Cognitive Skills: Attention,Safety Awareness     Visit Diagnosis: Other lack of coordination  Visuospatial deficit    Problem List Patient Active Problem List    Diagnosis Date Noted  . Amnesia 09/08/2020  . 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 Acute cerebrovascular accident (CVA) (HCC) 06/01/2020  . Paronychia of right thumb 05/10/2020  . Mediastinal adenopathy 01/06/2020  . Secondary hypercoagulable state (HCC) 10/22/2019  . Acute pancreatitis 01/22/2019  . Class 3 obesity due to excess calories with serious comorbidity and body mass index (BMI) of 40.0 to 44.9 in adult 12/08/2016  .  Paroxysmal atrial fibrillation (HCC) 01/23/2013  . Diabetes mellitus (HCC) 01/23/2013  . Hypertension 01/23/2013  . Tobacco user 01/23/2013  . Chronic diastolic heart failure (HCC) 01/21/2013  . 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 Shoulder pain, right 02/15/2011  . Seasonal and perennial allergic rhinitis 02/13/2008  . Dyslipidemia 02/12/2008  . Cocaine abuse (HCC) 02/12/2008  . Obstructive sleep apnea 02/12/2008    Kelli Churn, OTR/L 09/30/2020, 2:26 PM  [facility] [address] [place], [place], [address] Phone: [phone]   Fax:  [phone]  Name: Kwali Wrinkle. MRN: [id number] Date of Birth: 1971/01/03

## 2020-09-30 NOTE — Therapy (Signed)
[facility] [address] [place], [name], [address] Phone: [phone]   Fax:  [phone]  Physical Therapy Treatment  Patient Details  Name: [name]. MRN: [id number] Date of Birth: 1971/08/16 Referring Provider (PT): Angelica Pou, MD   Encounter Date: 09/30/2020   PT End of Session - 09/30/20 0937    Visit Number 6   number count corrected on 12/16   Number of Visits [place]    PT Start Time 0932    PT Stop Time 1015    PT Time Calculation (min) 43 min    Activity Tolerance Patient tolerated treatment well    Behavior During Therapy [facility] for tasks assessed/performed   seems fatigued at times; eyes closed occasionally          Past Medical History:  Diagnosis Date  . Diabetes mellitus   . GERD (gastroesophageal reflux disease)   . History of alcohol abuse   . History of cocaine use   . History of echocardiogram    a. Echo 4/14: Moderate LVH, vigorous LVEF, EF 65-70%, normal wall motion, grade 2 diastolic dysfunction, mildly dilated aortic root and ascending aorta, ascending aorta 40 mm, aortic root 38 mm, mild LAE  . Hx of cardiovascular stress test    a. GXT 5/14: No ischemic changes  //  b. ETT-Myoview 3/16:  Low risk, no ischemia, EF 58%  . Hyperlipidemia   . Hypertension   . Morbid obesity ([place])   . Paroxysmal atrial fibrillation ([place])    Occurring in 2008, with several recurrence since then (including in the setting of + cocaine on UDS).  . Sleep apnea    uses CPAP  . SVT (supraventricular tachycardia) ([place])    mid to long RP SVT 09/2019    Past Surgical History:  Procedure Laterality Date  . APPENDECTOMY    . ATRIAL FIBRILLATION ABLATION N/A 11/27/2019   Procedure: ATRIAL FIBRILLATION ABLATION;  Surgeon: Thompson Grayer, MD;  Location: [place] CV LAB;  Service: Cardiovascular;  Laterality: N/A;  . ROTATOR CUFF REPAIR    . SVT ABLATION N/A 11/27/2019    Procedure: SVT ABLATION;  Surgeon: Thompson Grayer, MD;  Location: [place] CV LAB;  Service: Cardiovascular;  Laterality: N/A;  . TONSILLECTOMY      There were no vitals filed for this visit.   Subjective Assessment - 09/30/20 0936    Subjective No new complaints. No falls or pain to report. States HEP is getting easier to do.    Patient Stated Goals Pt's goals for therapy are to get back to where I was with strength and coordination.    Currently in Pain? No/denies    Pain Score 0-No pain              [facility] PT Assessment - 09/30/20 0943      6 Minute Walk- Baseline   6 Minute Walk- Baseline yes    BP (mmHg) 150/93    HR (bpm) 68    02 Sat (%RA) 98 %    Modified Borg Scale for Dyspnea 0- Nothing at all    Perceived Rate of Exertion (Borg) 6-      6 Minute walk- Post Test   6 Minute Walk Post Test yes    BP (mmHg) 192/96    HR (bpm) 72    02 Sat (%RA) 99 %    Modified Borg Scale for Dyspnea 1- Very mild shortness of breath  Perceived Rate of Exertion (Borg) 7- Very, very light      6 minute walk test results    Aerobic Endurance Distance Walked 1197    Endurance additional comments no AD, no rest breaks      Functional Gait  Assessment   Gait assessed  Yes    Gait Level Surface Walks 20 ft in less than 7 sec but greater than 5.5 sec, uses assistive device, slower speed, mild gait deviations, or deviates 6-10 in outside of the 12 in walkway width.   6.37 sec's   Change in Gait Speed Able to smoothly change walking speed without loss of balance or gait deviation. Deviate no more than 6 in outside of the 12 in walkway width.    Gait with Horizontal Head Turns Performs head turns smoothly with no change in gait. Deviates no more than 6 in outside 12 in walkway width    Gait with Vertical Head Turns Performs head turns with no change in gait. Deviates no more than 6 in outside 12 in walkway width.    Gait and Pivot Turn Pivot turns safely within 3 sec and stops quickly  with no loss of balance.    Step Over Obstacle Is able to step over one shoe box (4.5 in total height) without changing gait speed. No evidence of imbalance.    Gait with Narrow Base of Support Ambulates 7-9 steps.    Gait with Eyes Closed Walks 20 ft, slow speed, abnormal gait pattern, evidence for imbalance, deviates 10-15 in outside 12 in walkway width. Requires more than 9 sec to ambulate 20 ft.   15 sec's   Ambulating Backwards Walks 20 ft, uses assistive device, slower speed, mild gait deviations, deviates 6-10 in outside 12 in walkway width.   >15 sec's   Steps Alternating feet, must use rail.    Total Score 23    FGA comment: 23/30 socred today               [facility] Adult PT Treatment/Exercise - 09/30/20 0947      Transfers   Transfers Sit to Stand;Stand to Sit    Sit to Stand 6: Modified independent (Device/Increase time);Without upper extremity assist;From chair/3-in-1    Stand to Sit 6: Modified independent (Device/Increase time);Without upper extremity assist;To chair/3-in-1      Ambulation/Gait   Ambulation/Gait Yes    Ambulation/Gait Assistance 6: Modified independent (Device/Increase time)    Assistive device None    Gait Pattern Step-through pattern;Decreased stride length;Poor foot clearance - left;Poor foot clearance - right    Ambulation Surface Level;Indoor      Self-Care   Self-Care Other Self-Care Comments    Other Self-Care Comments  discussed and reviewed current HEP. No issues reported. Pt states he is doing them at home a "few" days a week.               PT Short Term Goals - 09/30/20 0942      PT SHORT TERM GOAL #1   Title Pt will be independent with HEP for improved balance and gait.  TARGET 10/01/2020    Baseline 09/30/20: pt reports doing current HEP with no issues    Time --    Period --    Status Achieved      PT SHORT TERM GOAL #2   Title Pt to improve 6MWT by at least 75 ft for improved gait endurance and efficiency.    Baseline  09/30/20: 1197 feet (increased by 200 feet)  Time --    Period --    Status Achieved      PT SHORT TERM GOAL #3   Title Sensory Organization Test to be assessed, with goal written as appropriate.    Time 4    Period Weeks    Status On-going      PT SHORT TERM GOAL #4   Title Pt will improve FGA score to at least 22/30 for decreased fall risk.    Baseline 09/30/20: 23/30 scored today    Time --    Period --    Status Achieved             PT Long Term Goals - 09/02/20 1513      PT LONG TERM GOAL #1   Title Pt will be independent with progression of HEP for improved gait and balance.  TARGET 10/15/2020    Time 6    Period Weeks    Status New      PT LONG TERM GOAL #2   Title Pt will negotiate 4 steps with 1 handrail, alternating pattern, modified independently.    Time 6    Period Weeks    Status New      PT LONG TERM GOAL #3   Title Pt will verbalize understanding of fall prevention in home environment.    Time 6    Period Weeks    Status New      PT LONG TERM GOAL #4   Title Pt will ambulate at least 1000 ft, indoors and outdoors surfaces, independently, no LOB, for improved community ambulation.    Time 6    Period Weeks    Status New                 Plan - 09/30/20 2130    Clinical Impression Statement Today's skilled session focused on progress toward STGs with all goals checked met today. Pt has one goal remaining for the SOT to be performed and goals set as indicated. Will plan to check this goal at next session.    Personal Factors and Comorbidities Comorbidity 3+    Comorbidities diabetes, HTN, morbid obesity, history of polysubstance abuse (cocaine and alcohol)    Examination-Activity Limitations Locomotion Level;Transfers;Stairs    Examination-Participation Restrictions Community Activity;Yard Work    [employer] Evolving/Moderate complexity    Rehab Potential Good    PT Frequency 2x / week    PT Duration 6 weeks    plus eval   PT Treatment/Interventions ADLs/Self Care Home Management;Gait training;Stair training;Functional mobility training;Therapeutic activities;Therapeutic exercise;Balance training;Neuromuscular re-education;Patient/family education    PT Next Visit Plan check last goal for SOT? (will discuss with primary PT as has not been done to date, not sure if it was deferred); continue to work on balance on compliant surfaces and dynamic gait/balance. Begin to incorporate work simulation tasks- lifting, floor transfers, stand<>kneeling    Consulted and Agree with Plan of Care Patient           Patient will benefit from skilled therapeutic intervention in order to improve the following deficits and impairments:  Abnormal gait,Difficulty walking,Decreased balance,Decreased mobility  Visit Diagnosis: Unsteadiness on feet  Other abnormalities of gait and mobility     Problem List Patient Active Problem List   Diagnosis Date Noted  . Amnesia 09/08/2020  . 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 Acute cerebrovascular accident (CVA) ([place]) 06/01/2020  . Paronychia of right thumb 05/10/2020  . Mediastinal adenopathy 01/06/2020  . Secondary hypercoagulable  state ([place]) 10/22/2019  . Acute pancreatitis 01/22/2019  . Class 3 obesity due to excess calories with serious comorbidity and body mass index (BMI) of 40.0 to 44.9 in adult 12/08/2016  . Paroxysmal atrial fibrillation ([place]) 01/23/2013  . Diabetes mellitus ([place]) 01/23/2013  . Hypertension 01/23/2013  . Tobacco user 01/23/2013  . Chronic diastolic heart failure ([place]) 01/21/2013  . 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 Shoulder pain, right 02/15/2011  . Seasonal and perennial allergic rhinitis 02/13/2008  . Dyslipidemia 02/12/2008  . Cocaine abuse ([place]) 02/12/2008  . Obstructive sleep apnea 02/12/2008    Willow Ora, PTA, [facility] [address] [place], [place]  [phone] 09/30/20, 5:44 PM   Name: [name]. MRN: [id number] Date of Birth: [birth year]

## 2020-10-01 ENCOUNTER — Telehealth: Payer: Self-pay | Admitting: *Deleted

## 2020-10-01 ENCOUNTER — Other Ambulatory Visit: Payer: 59

## 2020-10-01 NOTE — Telephone Encounter (Addendum)
Call to [employer] for PA for Lokelma 10 GM Packets.  Awaiting determination which has been upgraded to Urgent.  PA  [id number] .Angelina Ok, RN 10/01/2020 11:29 AM   PA for Thompson Caul was approved after first denial.  PA [id number].  Patient was called and notified of approval. 12/20/201 thru 10/04/2021.  Angelina Ok, RN 12/20/201 4:52 PM.

## 2020-10-02 ENCOUNTER — Other Ambulatory Visit: Payer: Self-pay | Admitting: Internal Medicine

## 2020-10-04 ENCOUNTER — Other Ambulatory Visit: Payer: Self-pay

## 2020-10-04 ENCOUNTER — Encounter: Payer: Self-pay | Admitting: Physical Therapy

## 2020-10-04 ENCOUNTER — Ambulatory Visit: Payer: 59 | Admitting: Physical Therapy

## 2020-10-04 ENCOUNTER — Other Ambulatory Visit: Payer: Self-pay | Admitting: Internal Medicine

## 2020-10-04 ENCOUNTER — Ambulatory Visit: Payer: 59

## 2020-10-04 ENCOUNTER — Telehealth: Payer: Self-pay

## 2020-10-04 ENCOUNTER — Telehealth: Payer: Self-pay | Admitting: Internal Medicine

## 2020-10-04 DIAGNOSIS — R2681 Unsteadiness on feet: Secondary | ICD-10-CM | POA: Diagnosis not present

## 2020-10-04 DIAGNOSIS — R1310 Dysphagia, unspecified: Secondary | ICD-10-CM

## 2020-10-04 DIAGNOSIS — R41841 Cognitive communication deficit: Secondary | ICD-10-CM

## 2020-10-04 DIAGNOSIS — I10 Essential (primary) hypertension: Secondary | ICD-10-CM

## 2020-10-04 DIAGNOSIS — R471 Dysarthria and anarthria: Secondary | ICD-10-CM

## 2020-10-04 DIAGNOSIS — F8089 Other developmental disorders of speech and language: Secondary | ICD-10-CM

## 2020-10-04 LAB — ALDOSTERONE + RENIN ACTIVITY W/ RATIO
ALDOS/RENIN RATIO: 1.4 (ref 0.0–30.0)
ALDOSTERONE: 1.1 ng/dL (ref 0.0–30.0)
Renin: 0.803 ng/mL/hr (ref 0.167–5.380)

## 2020-10-04 NOTE — Patient Instructions (Signed)
  Try using the alarm to help you with taking your medicine. Your phone alarms can help you remember more things too!

## 2020-10-04 NOTE — Telephone Encounter (Signed)
Questions about meds, please call pt back. 

## 2020-10-04 NOTE — Therapy (Signed)
[facility] [address] [place], [name], [address] Phone: [phone]   Fax:  [phone]  Physical Therapy Treatment  Patient Details  Name: [name]. MRN: [id number] Date of Birth: [birth year] Referring Provider (PT): Angelica Pou, MD   Encounter Date: 10/04/2020   PT End of Session - 10/04/20 0954    Visit Number 7    Number of Visits 13    Authorization Type [employer]    PT Start Time 0800    PT Stop Time 0847    PT Time Calculation (min) 47 min    Activity Tolerance Patient tolerated treatment well    Behavior During Therapy [facility] for tasks assessed/performed   seems fatigued at times; eyes closed occasionally          Past Medical History:  Diagnosis Date  . Diabetes mellitus   . GERD (gastroesophageal reflux disease)   . History of alcohol abuse   . History of cocaine use   . History of echocardiogram    a. Echo 4/14: Moderate LVH, vigorous LVEF, EF 65-70%, normal wall motion, grade 2 diastolic dysfunction, mildly dilated aortic root and ascending aorta, ascending aorta 40 mm, aortic root 38 mm, mild LAE  . Hx of cardiovascular stress test    a. GXT 5/14: No ischemic changes  //  b. ETT-Myoview 3/16:  Low risk, no ischemia, EF 58%  . Hyperlipidemia   . Hypertension   . Morbid obesity ([place])   . Paroxysmal atrial fibrillation ([place])    Occurring in 2008, with several recurrence since then (including in the setting of + cocaine on UDS).  . Sleep apnea    uses CPAP  . SVT (supraventricular tachycardia) ([place])    mid to long RP SVT 09/2019    Past Surgical History:  Procedure Laterality Date  . APPENDECTOMY    . ATRIAL FIBRILLATION ABLATION N/A 11/27/2019   Procedure: ATRIAL FIBRILLATION ABLATION;  Surgeon: Thompson Grayer, MD;  Location: [facility];  Service: Cardiovascular;  Laterality: N/A;  . ROTATOR CUFF REPAIR    . SVT ABLATION N/A 11/27/2019   Procedure: SVT ABLATION;   Surgeon: Thompson Grayer, MD;  Location: [facility];  Service: Cardiovascular;  Laterality: N/A;  . TONSILLECTOMY      There were no vitals filed for this visit.   Subjective Assessment - 10/04/20 0805    Subjective Had a fall on Friday.  Missed the last step and fell; didn't get hurt.    Patient Stated Goals Pt's goals for therapy are to get back to where I was with strength and coordination.    Currently in Pain? No/denies                       Sensory Organization Test:  Condition1:  Decreased all trials Condition 2:  Decreased all trials Condition 3:  Fall trial 1, decreased trial 2-3 Condition 4:  Fall trial 1 and 3; decreased trial 2 Condition 5:  Decreased trial 1, fall trial 2-3 Condition 6:  Fall trials 1-3  Composite Score:  29 (normal is approx 70)  Sensory Analysis:   Somatosensory: approx 85 (normal is 90) Visual:  20 (normal is 75) Vestibular:  20 (normal is 55)  Strategy Analysis:  Ankle dominant multiple conditions (Please note, pt takes multiple steps and reaches out to wall for multiple conditions)  COG analysis:  Posterior  Balance Exercises - 10/04/20 0001      Balance Exercises: Standing   SLS with Vectors Foam/compliant surface;Upper extremity assist 2;Other reps (comment);Limitations    SLS with Vectors Limitations Standing on Airex:  alternating step taps to 6" step, BUE support    Wall Bumps Hip;Eyes opened;10 reps    Wall Bumps Limitations 2 sets, holding at rails of steps    Stepping Strategy Posterior;Foam/compliant surface;10 reps;Limitations    Stepping Strategy Limitations BUE supported    Heel Raises 10 reps;Both;Limitations    Heel Raises Limitations 2 sets; 2nd set, 1 UE support    Toe Raise 10 reps;Both;Limitations    Toe Raise Limitations 2 sets; 2nd set 1 UE support only   LOB several times, requiring multiple steps to regain balance.          Standing on Airex:  minisquats x 10 reps, then  minisquats and up on toes x 10 reps    PT Education - 10/04/20 0952    Education Details Explained results of Sensory Organization test and real-life scenarios that would give him balance difficulty (stairs, unlevel ground, lowly lit and busy surroundings).  Explained how we will continue to address with exercises and pt's needing to use caution and extra time/attention to scenarios above for improved safety.    Person(s) Educated Patient    Methods Explanation    Comprehension Verbalized understanding            PT Short Term Goals - 10/04/20 0954      PT SHORT TERM GOAL #1   Title Pt will be independent with HEP for improved balance and gait.  TARGET 10/01/2020    Baseline 09/30/20: pt reports doing current HEP with no issues    Status Achieved      PT SHORT TERM GOAL #2   Title Pt to improve 6MWT by at least 75 ft for improved gait endurance and efficiency.    Baseline 09/30/20: 1197 feet (increased by 200 feet)    Status Achieved      PT SHORT TERM GOAL #3   Title Sensory Organization Test to be assessed, with goal written as appropriate.    Baseline SOT had been deferred by primary PT, but completed 10/04/2020 visit; composite score 29 and all measures decreased compared to normal    Time 4    Period Weeks    Status Achieved      PT SHORT TERM GOAL #4   Title Pt will improve FGA score to at least 22/30 for decreased fall risk.    Baseline 09/30/20: 23/30 scored today    Status Achieved             PT Long Term Goals - 10/04/20 0955      PT LONG TERM GOAL #1   Title Pt will be independent with progression of HEP for improved gait and balance.  TARGET 10/15/2020    Time 6    Period Weeks    Status On-going      PT LONG TERM GOAL #2   Title Pt will negotiate 4 steps with 1 handrail, alternating pattern, modified independently.    Time 6    Period Weeks    Status On-going      PT LONG TERM GOAL #3   Title Pt will verbalize understanding of fall prevention in  home environment.    Time 6    Period Weeks    Status On-going      PT LONG TERM GOAL #4  Title Pt will ambulate at least 1000 ft, indoors and outdoors surfaces, independently, no LOB, for improved community ambulation.    Time 6    Period Weeks    Status On-going      PT LONG TERM GOAL #5   Title Sensory Organization Test composite score to improve by at least 10 points for improved balance.    Baseline 29 on 10/04/2020    Time 2    Period Weeks    Status New                 Plan - 10/04/20 0957    Clinical Impression Statement STG for SOT (Sensory Organization Test) met, and LTG written.  Pt demonstrated decreased overall balance, decreased vestibular, visual and somatosensory system use for balance; and he demonstrates decreased use of hip and ankle strategy, with posterior lean for most of the test.  Pt reports his exercises are easy and what he does in therapy session is easy, but in real life situations, he has difficulty, with one recent fall.  Today with balance exercises, he uses BUE support and when support lessened, he has several posterior losses of balance.  He will continue to benefit from skilled PT to further address high level balance.    Personal Factors and Comorbidities Comorbidity 3+    Comorbidities diabetes, HTN, morbid obesity, history of polysubstance abuse (cocaine and alcohol)    Examination-Activity Limitations Locomotion Level;Transfers;Stairs    Examination-Participation Restrictions Community Activity;Yard Work    [employer] Evolving/Moderate complexity    Rehab Potential Good    PT Frequency 2x / week    PT Duration 6 weeks   plus eval   PT Treatment/Interventions ADLs/Self Care Home Management;Gait training;Stair training;Functional mobility training;Therapeutic activities;Therapeutic exercise;Balance training;Neuromuscular re-education;Patient/family education    PT Next Visit Plan Need to work on hip/ankle/step strategy  work for balance, functional strength (step ups, step downs), compliant surfaces for balance reactions/balance strategy work.  Begin working on work simulation tasks; may need to discuss additional appointments and additional POC.    Consulted and Agree with Plan of Care Patient           Patient will benefit from skilled therapeutic intervention in order to improve the following deficits and impairments:  Abnormal gait,Difficulty walking,Decreased balance,Decreased mobility  Visit Diagnosis: Unsteadiness on feet     Problem List Patient Active Problem List   Diagnosis Date Noted  . Amnesia 09/08/2020  . 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 Acute cerebrovascular accident (CVA) ([place]) 06/01/2020  . Paronychia of right thumb 05/10/2020  . Mediastinal adenopathy 01/06/2020  . Secondary hypercoagulable state ([place]) 10/22/2019  . Acute pancreatitis 01/22/2019  . Class 3 obesity due to excess calories with serious comorbidity and body mass index (BMI) of 40.0 to 44.9 in adult 12/08/2016  . Paroxysmal atrial fibrillation ([place]) 01/23/2013  . Diabetes mellitus ([place]) 01/23/2013  . Hypertension 01/23/2013  . Tobacco user 01/23/2013  . Chronic diastolic heart failure ([place]) 01/21/2013  . 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 Shoulder pain, right 02/15/2011  . Seasonal and perennial allergic rhinitis 02/13/2008  . Dyslipidemia 02/12/2008  . Cocaine abuse ([place]) 02/12/2008  . Obstructive sleep apnea 02/12/2008    Ether Wolters W. 10/04/2020, 10:03 AM  Frazier Butt., PT   [facility] [address] [place], [name], [address] Phone: [phone]   Fax:  [phone]  Name: [name]. MRN: [id number] Date of Birth: [birth year]

## 2020-10-04 NOTE — Therapy (Signed)
[facility] [address] [place], [place], [address] Phone: [phone]   Fax:  [phone]  Speech Language Pathology Evaluation  Patient Details  Name: Douglas BandLeonidas Claude Umeda Jr. MRN: [id number] Date of Birth: November 13, 1970 Referring Provider (SLP): Ihor AustinMcCue, Jessica, NP   Encounter Date: 10/04/2020   End of Session - 10/04/20 1738    Visit Number 1    Number of Visits 17    Date for SLP Re-Evaluation 12/31/20    SLP Start Time 0936    SLP Stop Time  1017    SLP Time Calculation (min) 41 min    Activity Tolerance Patient tolerated treatment well           Past Medical History:  Diagnosis Date  . Diabetes mellitus   . GERD (gastroesophageal reflux disease)   . History of alcohol abuse   . History of cocaine use   . History of echocardiogram    a. Echo 4/14: Moderate LVH, vigorous LVEF, EF 65-70%, normal wall motion, grade 2 diastolic dysfunction, mildly dilated aortic root and ascending aorta, ascending aorta 40 mm, aortic root 38 mm, mild LAE  . Hx of cardiovascular stress test    a. GXT 5/14: No ischemic changes  //  b. ETT-Myoview 3/16:  Low risk, no ischemia, EF 58%  . Hyperlipidemia   . Hypertension   . Morbid obesity ([facility])   . Paroxysmal atrial fibrillation ([facility])    Occurring in 2008, with several recurrence since then (including in the setting of + cocaine on UDS).  . Sleep apnea    uses CPAP  . SVT (supraventricular tachycardia) ([facility])    mid to long RP SVT 09/2019    Past Surgical History:  Procedure Laterality Date  . APPENDECTOMY    . ATRIAL FIBRILLATION ABLATION N/A 11/27/2019   Procedure: ATRIAL FIBRILLATION ABLATION;  Surgeon: Hillis RangeAllred, James, MD;  [facility];  Service: Cardiovascular;  Laterality: N/A;  . ROTATOR CUFF REPAIR    . SVT ABLATION N/A 11/27/2019   Procedure: SVT ABLATION;  Surgeon: Hillis RangeAllred, James, MD;  Location: [facility];  Service: Cardiovascular;   Laterality: N/A;  . TONSILLECTOMY      There were no vitals filed for this visit.   Subjective Assessment - 10/04/20 0946    Subjective "My memory is terrible." "I've always had a stutter." Pt reports more repetitions per dysfluency now, compared to prior to CVA.    Currently in Pain? No/denies              [facility] - 10/04/20 [id number]      SLP Visit Information   SLP Received On 10/04/20    Referring Provider (SLP) Ihor AustinMcCue, Jessica, NP    Onset Date 06-01-20    Medical Diagnosis lt CVA      Subjective   Patient/Family Stated Goal "At least halfway talking back like I was. Like I said I always had a stutter."      Balance Screen   Has the patient fallen in the past 6 months Yes    How many times? "about 5"   "tripping over stuff"   Has the patient had a decrease in activity level because of a fear of falling?  Yes    Is the patient reluctant to leave their home because of a fear of falling?  No      Prior Functional Status   Cognitive/Linguistic Baseline Within functional limits    Type of Home House  Lives With Family    Available Support Family    Education 12th grade    Vocation --   "between jobs"     Cognition   Overall Cognitive Status Impaired/Different from baseline    Area of Impairment Memory;Attention    Current Attention Level Selective    Attention Comments He reports he cannot pay as close attention to things as prior to CVA. [name] states that sometimes he is driving and in the middle of his route cannot recall where he is.    Memory Decreased short-term memory    Memory Comments Pt's sister reminding him to take meds, so he put med box (which sister fills) on the windowledge so it is the first thing that he would see when he wakes up in AM. For PM meds, sister reminds him. Pt states this is "annoying" to him.  Pt demonstrates he knows when he is supposed to take meds (6am, 6pm). In order to remember better "I try to remember better. My  handwriting is garbage now." Pt says he writes down list going to grocery store. He tells SLP sometimes in the middle of driving route he asks himself where he is going. SLP assisted pt setting alarm for 6PM and 6 AM meds today.    Behaviors Other (comment)   looking out window frequently     Auditory Comprehension   Overall Auditory Comprehension Appears within functional limits for tasks assessed      Verbal Expression   Overall Verbal Expression Appears within functional limits for tasks assessed      Oral Motor/Sensory Function   Overall Oral Motor/Sensory Function Impaired    Lingual Strength --   bilateral but more pronounced weakness on rt   Overall Oral Motor/Sensory Function functional/normal except for min reduced lingual strength bil, slightly moreso on rt than lt      Motor Speech   Overall Motor Speech Impaired at baseline   re: mild dysfluency                          SLP Education - 10/04/20 1737    Education Details memory compensations (phone alarm), how to set phone alarm    Person(s) Educated Patient    Methods Explanation;Demonstration;Verbal cues    Comprehension Need further instruction;Verbal cues required;Verbalized understanding;Returned demonstration            SLP Short Term Goals - 10/04/20 1740      SLP SHORT TERM GOAL #1   Title pt will tell SLP three ways to compensate for his memory in 3 sessions, with modifeid independence    Time 4    Period Weeks    Status New      SLP SHORT TERM GOAL #2   Title pt will demonstrate selective attention to simple-mod complex task for 8 mintues in quiet environment in 2 sessions    Time 4    Period Weeks    Status New      SLP SHORT TERM GOAL #3   Title pt will demo speech compensations for intelligibility 100% in 10 minutes simple/mod compelx conversation in 2 sessions    Time 4    Period Weeks    Status New            SLP Long Term Goals - 10/04/20 1746      SLP LONG TERM GOAL  #1   Title pt will demo simple divided attention skills in functinoal cogntive communcation  tasks in 3 sessions    Time 8    Period Weeks   or 17 visits, for all LTGs   Status New      SLP LONG TERM GOAL #2   Title pt will report improving swallow skills by telling SLP frequency of cough during liquid consumption is once/month    Time 8    Period Weeks    Status New      SLP LONG TERM GOAL #3   Title pt will use memory compensations for appointments, medications, exercise/homework tracking, and/or to-do lists, etc in 5 sessions    Time 8    Period Weeks    Status New            Plan - 10/04/20 1738    Clinical Impression Statement [name] presents today with a cognitive communication deficit and possible dysphagia ("I choke probably about once every 2 weeks - it used to be a couple times a week.") He provides examples of attention and memory deficits, and reports his speech is dysarthric, although SLP did not hear any dysarthria today. Rt lingual strength appears slightly reduced. Pt requires formal cognitive linguistic testing to determine severity of pt's cognitive linguistic defiict. Pt would benefit from skilled ST addressing cognition-communication as well as (possibly) dysarthria in order to return to workforce in some capacity and to better communicate with friends and family. Pt may also require objective swallow exam during this therapy course - although it appears as if dysphagia is resolving spontaneously.    Speech Therapy Frequency 2x / week    Duration 8 weeks   or 17 sessions   Treatment/Interventions Aspiration precaution training;Pharyngeal strengthening exercises;Diet toleration management by SLP;Trials of upgraded texture/liquids;Cueing hierarchy;Cognitive reorganization;Internal/external aids;Patient/family education;Compensatory strategies;SLP instruction and feedback;Functional tasks    Potential to Achieve Goals Good    Potential Considerations Previous level of  function;Cooperation/participation level   hx of decr'd follow through of medical recommendations   Consulted and Agree with Plan of Care Patient           Patient will benefit from skilled therapeutic intervention in order to improve the following deficits and impairments:   Cognitive communication deficit - Plan: SLP plan of care cert/re-cert  Dysarthria and anarthria - Plan: SLP plan of care cert/re-cert  Dysphagia, unspecified type - Plan: SLP plan of care cert/re-cert  Developmental dysfluency - Plan: SLP plan of care cert/re-cert    Problem List Patient Active Problem List   Diagnosis Date Noted  . Amnesia 09/08/2020  . 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 Acute cerebrovascular accident (CVA) ([facility]) 06/01/2020  . Paronychia of right thumb 05/10/2020  . Mediastinal adenopathy 01/06/2020  . Secondary hypercoagulable state ([facility]) 10/22/2019  . Acute pancreatitis 01/22/2019  . Class 3 obesity due to excess calories with serious comorbidity and body mass index (BMI) of 40.0 to 44.9 in adult 12/08/2016  . Paroxysmal atrial fibrillation ([facility]) 01/23/2013  . Diabetes mellitus ([facility]) 01/23/2013  . Hypertension 01/23/2013  . Tobacco user 01/23/2013  . Chronic diastolic heart failure ([facility]) 01/21/2013  . 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 Shoulder pain, right 02/15/2011  . Seasonal and perennial allergic rhinitis 02/13/2008  . Dyslipidemia 02/12/2008  . Cocaine abuse ([facility]) 02/12/2008  . Obstructive sleep apnea 02/12/2008    [facility] ,MS, CCC-SLP  10/04/2020, 6:04 PM  [facility] [address] [place], [place], [address] Phone: [phone]   Fax:  [phone]  Name: Darril Patriarca. MRN: [id number]  Date of Birth: 01-Oct-1971

## 2020-10-04 NOTE — Telephone Encounter (Signed)
Explained to pt what a PA is and it may take a few days to get approval, will send to [facility]. To call pt as soon as approval is given

## 2020-10-04 NOTE — Telephone Encounter (Signed)
It appears that [name] has not had any follow-up labs.  I called him to report to the clinic to have repeat blood work and he expressed understanding.

## 2020-10-06 ENCOUNTER — Ambulatory Visit: Payer: 59 | Admitting: Occupational Therapy

## 2020-10-06 ENCOUNTER — Ambulatory Visit: Payer: 59 | Admitting: Physical Therapy

## 2020-10-06 ENCOUNTER — Other Ambulatory Visit: Payer: Self-pay

## 2020-10-06 ENCOUNTER — Ambulatory Visit: Payer: 59

## 2020-10-06 ENCOUNTER — Encounter: Payer: Self-pay | Admitting: Physical Therapy

## 2020-10-06 DIAGNOSIS — F8089 Other developmental disorders of speech and language: Secondary | ICD-10-CM

## 2020-10-06 DIAGNOSIS — R278 Other lack of coordination: Secondary | ICD-10-CM

## 2020-10-06 DIAGNOSIS — R2681 Unsteadiness on feet: Secondary | ICD-10-CM

## 2020-10-06 DIAGNOSIS — R2689 Other abnormalities of gait and mobility: Secondary | ICD-10-CM

## 2020-10-06 DIAGNOSIS — R41842 Visuospatial deficit: Secondary | ICD-10-CM

## 2020-10-06 DIAGNOSIS — R1310 Dysphagia, unspecified: Secondary | ICD-10-CM

## 2020-10-06 DIAGNOSIS — R471 Dysarthria and anarthria: Secondary | ICD-10-CM

## 2020-10-06 DIAGNOSIS — I69318 Other symptoms and signs involving cognitive functions following cerebral infarction: Secondary | ICD-10-CM

## 2020-10-06 NOTE — Therapy (Signed)
[facility] [address] [place], [place], [address] Phone: [phone]   Fax:  [phone]  Occupational Therapy Treatment  Patient Details  Name: [name]. MRN: [id number] Date of Birth: Apr 11, 1971 No data recorded  Encounter Date: 10/06/2020   OT End of Session - 10/06/20 0900    Visit Number 5    Number of Visits 12    Date for OT Re-Evaluation 10/15/20    Authorization Type [employer]    OT Start Time [phone]    OT Stop Time 0931    OT Time Calculation (min) 38 min    Activity Tolerance Patient tolerated treatment well    Behavior During Therapy WFL for tasks assessed/performed           Past Medical History:  Diagnosis Date   Diabetes mellitus    GERD (gastroesophageal reflux disease)    History of alcohol abuse    History of cocaine use    History of echocardiogram    a. Echo 4/14: Moderate LVH, vigorous LVEF, EF 65-70%, normal wall motion, grade 2 diastolic dysfunction, mildly dilated aortic root and ascending aorta, ascending aorta 40 mm, aortic root 38 mm, mild LAE   Hx of cardiovascular stress test    a. GXT 5/14: No ischemic changes  //  b. ETT-Myoview 3/16:  Low risk, no ischemia, EF 58%   Hyperlipidemia    Hypertension    Morbid obesity (HCC)    Paroxysmal atrial fibrillation (HCC)    Occurring in 2008, with several recurrence since then (including in the setting of + cocaine on UDS).   Sleep apnea    uses CPAP   SVT (supraventricular tachycardia) (HCC)    mid to long RP SVT 09/2019    Past Surgical History:  Procedure Laterality Date   APPENDECTOMY     ATRIAL FIBRILLATION ABLATION N/A 11/27/2019   Procedure: ATRIAL FIBRILLATION ABLATION;  Surgeon: [name], MD;  [facility]: [facility] IN[facility];  Service: Cardiovascular;  Laterality: N/A;   ROTATOR CUFF REPAIR     SVT ABLATION N/A 11/27/2019   Procedure: SVT ABLATION;  Surgeon: [name], MD;  Location: [facility]  INVASIVE CV LAB;  Service: Cardiovascular;  Laterality: N/A;   TONSILLECTOMY      There were no vitals filed for this visit.   Subjective Assessment - 10/06/20 0855    Subjective  I don't like school work    Pertinent History CVA 06/01/20. PMH: HTN, DM, sleep apnea, ETOH and cocaine abuse    Limitations no heavy lifting    Currently in Pain? No/denies           Copying PVC design with min-mod cueing for problem-solving (needed cueing to sort pieces/use information/sizing and key to determine which piece to use) initially.  Then copied design with min cueing for omission of 2 pieces.  Copied 2nd design with 100% accuracy.   Environmental scanning in minimally distracting environment (down hallway) with 9/15 items located initially and remaining found without cueing in 2nd pass (4/6 missed were on the L side).  Placing grooved pegs in pegboard with L hand with min difficulty for incr coordination     OT Long Term Goals - 09/16/20 0915      OT LONG TERM GOAL #1   Title Independent with coordination HEP    Time 6    Period Weeks    Status Achieved      OT LONG TERM GOAL #2   Title Improve coordination  Lt hand as evidenced by reducing speed on 9 hole peg test to 35 sec or less    Time 6    Period Weeks    Status On-going      OT LONG TERM GOAL #3   Title Pt to perform tabletop scanning at 90% or greater accuracy    Time 6    Period Weeks    Status On-going      OT LONG TERM GOAL #4   Title Pt to perform environmental scanning at 85% accuracy or greater while performing simple physical task and without bumping into objects/furniture/walls    Time 6    Period Weeks    Status On-going      OT LONG TERM GOAL #5   Title Pt to perform simulated work tasks safely    Time 6    Period Weeks    Status New      OT LONG TERM GOAL #6   Title Pt to attend to task for 15 minutes without distraction and remain alert    Time 6    Period Weeks    Status New                  Plan - 10/06/20 2355    Clinical Impression Statement Pt with min difficulty with problem solving of tabletop visual perceptual task initially, but improved with repetition.  Pt also demo decr accuracy with environmental scanning today with unfamiliar path.    OT Occupational Profile and History Problem Focused Assessment - Including review of records relating to presenting problem    Occupational performance deficits (Please refer to evaluation for details): IADL's;Work    Body Structure / Function / Physical Skills IADL;Coordination;FMC;Decreased knowledge of precautions;UE functional use    Cognitive Skills Attention;Safety Awareness    Rehab Potential Good    Clinical Decision Making Several treatment options, min-mod task modification necessary    Comorbidities Affecting Occupational Performance: May have comorbidities impacting occupational performance    Modification or Assistance to Complete Evaluation  No modification of tasks or assist necessary to complete eval    OT Frequency 2x / week    OT Duration 6 weeks   or 12 visits over extended weeks (d/t potential scheduling conflicts)   OT Treatment/Interventions Therapeutic activities;Therapeutic exercise;Cognitive remediation/compensation;Neuromuscular education;Functional Mobility Training;Visual/perceptual remediation/compensation;Patient/family education    Plan continue with environmental/visual scanning and coordination for L hand    Consulted and Agree with Plan of Care Patient           Patient will benefit from skilled therapeutic intervention in order to improve the following deficits and impairments:   Body Structure / Function / Physical Skills: IADL,Coordination,FMC,Decreased knowledge of precautions,UE functional use Cognitive Skills: Attention,Safety Awareness     Visit Diagnosis: Visuospatial deficit  Other lack of coordination  Unsteadiness on feet  Other symptoms and signs involving cognitive  functions following cerebral infarction  Other abnormalities of gait and mobility    Problem Walsh Patient Active Problem Walsh   Diagnosis Date Noted   Amnesia 09/08/2020   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   Acute cerebrovascular accident (CVA) (HCC) 06/01/2020   Paronychia of right thumb 05/10/2020   Mediastinal adenopathy 01/06/2020   Secondary hypercoagulable state (HCC) 10/22/2019   Acute pancreatitis 01/22/2019   Class 3 obesity due to excess calories with serious comorbidity and body mass index (BMI) of 40.0 to 44.9 in adult 12/08/2016   Paroxysmal atrial fibrillation (HCC) 01/23/2013   Diabetes mellitus (HCC) 01/23/2013  Hypertension 01/23/2013   Tobacco user 01/23/2013   Chronic diastolic heart failure (HCC) 01/21/2013   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 [place], [place], [address] Phone: [phone]   Fax:  [phone]  Name: [name]. MRN: [id number] Date of Birth: Mar 25, 1971   Willa Frater, OTR/L [facility] [address] [place], [place]  [address] [phone] phone [phone] 10/06/20 10:11 AM

## 2020-10-06 NOTE — Patient Instructions (Addendum)
Memory Compensation Strategies  1. Use "WARM" strategy. W= write it down A=  associate it R=  repeat it M=  make a mental picture  2. You can keep a Glass blower/designer. Use a 3-ring notebook with sections for the following:  calendar, important names and phone numbers, medications, doctors' names/phone numbers, "to do list"/reminders, and a section to journal what you did each day  3. Use a calendar to write appointments down.  4. Write yourself a schedule for the day.  This can be placed on the calendar or in a separate section of the Memory Notebook.  Keeping a regular schedule can help memory.  5. Use medication organizer with sections for each day or morning/evening pills  You may need help loading it  6. Keep a basket, or pegboard by the door.   Place items that you need to take out with you in the basket or on the pegboard.  You may also want to include a message board for reminders.  7. Use sticky notes. Place sticky notes with reminders in a place where the task is performed.  For example:  "turn off the stove" placed by the stove, "lock the door" placed on the door at eye level, "take your medications" on the bathroom mirror or by the place where you normally take your medications  8. Use alarms/timers.  Use while cooking to remind yourself to check on food or as a reminder to take your medicine, or as a reminder to make a call, or as a reminder to perform another task, etc.  9. Use a voice app, or a small tape recorder to record important information and notes for yourself.

## 2020-10-06 NOTE — Therapy (Signed)
[facility] [address] [place], [place], [address] Phone: [phone]   Fax:  [phone]  Speech Language Pathology Treatment  Patient Details  Name: [name]. MRN: [id number] Date of Birth: Nov 22, 1970 Referring Provider (SLP): Ihor Austin, NP   Encounter Date: 10/06/2020   End of Session - 10/06/20 0954    Visit Number 2    Number of Visits 17    Date for SLP Re-Evaluation 12/31/20    SLP Start Time 0935    SLP Stop Time  1015    SLP Time Calculation (min) 40 min    Activity Tolerance Patient tolerated treatment well           Past Medical History:  Diagnosis Date  . Diabetes mellitus   . GERD (gastroesophageal reflux disease)   . History of alcohol abuse   . History of cocaine use   . History of echocardiogram    a. Echo 4/14: Moderate LVH, vigorous LVEF, EF 65-70%, normal wall motion, grade 2 diastolic dysfunction, mildly dilated aortic root and ascending aorta, ascending aorta 40 mm, aortic root 38 mm, mild LAE  . Hx of cardiovascular stress test    a. GXT 5/14: No ischemic changes  //  b. ETT-Myoview 3/16:  Low risk, no ischemia, EF 58%  . Hyperlipidemia   . Hypertension   . Morbid obesity (HCC)   . Paroxysmal atrial fibrillation (HCC)    Occurring in 2008, with several recurrence since then (including in the setting of + cocaine on UDS).  . Sleep apnea    uses CPAP  . SVT (supraventricular tachycardia) (HCC)    mid to long RP SVT 09/2019    Past Surgical History:  Procedure Laterality Date  . APPENDECTOMY    . ATRIAL FIBRILLATION ABLATION N/A 11/27/2019   Procedure: ATRIAL FIBRILLATION ABLATION;  Surgeon: [name], MD;  [facility];  Service: Cardiovascular;  Laterality: N/A;  . ROTATOR CUFF REPAIR    . SVT ABLATION N/A 11/27/2019   Procedure: SVT ABLATION;  Surgeon: [name], MD;  [facility]: [facility];  Service: Cardiovascular;  Laterality:  N/A;  . TONSILLECTOMY      There were no vitals filed for this visit.   Subjective Assessment - 10/06/20 0942    Subjective "CPAP? Yah I have that. I get dried out and stopped up so sometimes I tear it off."    Currently in Pain? No/denies                 ADULT SLP TREATMENT - 10/06/20 0945      General Information   Behavior/Cognition Cooperative;Alert;Pleasant mood      Treatment Provided   Treatment provided Cognitive-Linquistic      Cognitive-Linquistic Treatment   Treatment focused on Cognition    Skilled Treatment Pt told SLP he used his alarm to take meds last night and this morning, successfully. SLP educated pt about memory strategies and provided examples of each for pt. Rajesh stated he used some of these already as SLP described them and gave examples. Pt told SLP he would likely cont to use his phone for reminders, notes, and alarms for meds.      Assessment / Recommendations / Plan   Plan Continue with current plan of care      Progression Toward Goals   Progression toward goals Progressing toward goals            SLP Education - 10/06/20 1317  Education Details memory compensations (handout) with examples by SLP    Person(s) Educated Patient    Methods Explanation;Handout;Demonstration    Comprehension Verbalized understanding;Need further instruction            SLP Short Term Goals - 10/06/20 0954      SLP SHORT TERM GOAL #1   Title pt will tell SLP three ways to compensate for his memory in 3 sessions, with modifeid independence    Time 4    Period Weeks    Status On-going      SLP SHORT TERM GOAL #2   Title pt will demonstrate selective attention to simple-mod complex task for 8 mintues in quiet environment in 2 sessions    Time 4    Period Weeks    Status On-going      SLP SHORT TERM GOAL #3   Title pt will demo speech compensations for intelligibility 100% in 10 minutes simple/mod compelx conversation in 2 sessions    Time 4     Period Weeks    Status On-going            SLP Long Term Goals - 10/06/20 1318      SLP LONG TERM GOAL #1   Title pt will demo simple divided attention skills in functinoal cogntive communcation tasks in 3 sessions    Time 8    Period Weeks   or 17 visits, for all LTGs   Status On-going      SLP LONG TERM GOAL #2   Title pt will report improving swallow skills by telling SLP frequency of cough during liquid consumption is once/month    Time 8    Period Weeks    Status On-going      SLP LONG TERM GOAL #3   Title pt will use memory compensations for appointments, medications, exercise/homework tracking, and/or to-do lists, etc in 5 sessions    Time 8    Period Weeks    Status On-going            Plan - 10/06/20 1317    Clinical Impression Statement Milton Ferguson presents today with a cognitive communication deficit and possible dysphagia ("I choke probably about once every 2 weeks - it used to be a couple times a week.") He provides examples of attention and memory deficits, and reports his speech is dysarthric, although SLP did not hear any dysarthria today. Pt requires formal cognitive linguistic testing to determine severity of pt's cognitive linguistic defiict. Pt would benefit from skilled ST addressing cognition-communication as well as (possibly) dysarthria in order to return to workforce in some capacity and to better communicate with friends and family. Pt may also require objective swallow exam during this therapy course - although it appears as if dysphagia is resolving spontaneously.    Speech Therapy Frequency 2x / week    Duration 8 weeks   or 17 sessions   Treatment/Interventions Aspiration precaution training;Pharyngeal strengthening exercises;Diet toleration management by SLP;Trials of upgraded texture/liquids;Cueing hierarchy;Cognitive reorganization;Internal/external aids;Patient/family education;Compensatory strategies;SLP instruction and feedback;Functional tasks     Potential to Achieve Goals Good    Potential Considerations Previous level of function;Cooperation/participation level   hx of decr'd follow through of medical recommendations   Consulted and Agree with Plan of Care Patient           Patient will benefit from skilled therapeutic intervention in order to improve the following deficits and impairments:   Dysarthria and anarthria  Dysphagia, unspecified type  Developmental dysfluency  Problem List Patient Active Problem List   Diagnosis Date Noted  . Amnesia 09/08/2020  . 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 Acute cerebrovascular accident (CVA) (HCC) 06/01/2020  . Paronychia of right thumb 05/10/2020  . Mediastinal adenopathy 01/06/2020  . Secondary hypercoagulable state (HCC) 10/22/2019  . Acute pancreatitis 01/22/2019  . Class 3 obesity due to excess calories with serious comorbidity and body mass index (BMI) of 40.0 to 44.9 in adult 12/08/2016  . Paroxysmal atrial fibrillation (HCC) 01/23/2013  . Diabetes mellitus (HCC) 01/23/2013  . Hypertension 01/23/2013  . Tobacco user 01/23/2013  . Chronic diastolic heart failure (HCC) 01/21/2013  . 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 Shoulder pain, right 02/15/2011  . Seasonal and perennial allergic rhinitis 02/13/2008  . Dyslipidemia 02/12/2008  . Cocaine abuse (HCC) 02/12/2008  . Obstructive sleep apnea 02/12/2008    [facility] ,MS, CCC-SLP  10/06/2020, 1:19 PM  [facility] [address] [place], [place], [address] Phone: [phone]   Fax:  [phone]   Name: Visente Kirker. MRN: [id number] Date of Birth: 12-May-1971

## 2020-10-06 NOTE — Therapy (Signed)
[facility] [address] [place], [place], [address] Phone: [phone]   Fax:  [phone]  Physical Therapy Treatment  Patient Details  Name: [name]. MRN: [id number] Date of Birth: [birth year] Referring Provider (PT): Miguel Aschoff, MD   Encounter Date: 10/06/2020   PT End of Session - 10/06/20 0807    Visit Number 8    Number of Visits 13    Authorization Type UHC    PT Start Time 0804    PT Stop Time 0845    PT Time Calculation (min) 41 min    Activity Tolerance Patient tolerated treatment well    Behavior During Therapy [facility] for tasks assessed/performed           Past Medical History:  Diagnosis Date  . Diabetes mellitus   . GERD (gastroesophageal reflux disease)   . History of alcohol abuse   . History of cocaine use   . History of echocardiogram    a. Echo 4/14: Moderate LVH, vigorous LVEF, EF 65-70%, normal wall motion, grade 2 diastolic dysfunction, mildly dilated aortic root and ascending aorta, ascending aorta 40 mm, aortic root 38 mm, mild LAE  . Hx of cardiovascular stress test    a. GXT 5/14: No ischemic changes  //  b. ETT-Myoview 3/16:  Low risk, no ischemia, EF 58%  . Hyperlipidemia   . Hypertension   . Morbid obesity (HCC)   . Paroxysmal atrial fibrillation (HCC)    Occurring in 2008, with several recurrence since then (including in the setting of + cocaine on UDS).  . Sleep apnea    uses CPAP  . SVT (supraventricular tachycardia) (HCC)    mid to long RP SVT 09/2019    Past Surgical History:  Procedure Laterality Date  . APPENDECTOMY    . ATRIAL FIBRILLATION ABLATION N/A 11/27/2019   Procedure: ATRIAL FIBRILLATION ABLATION;  Surgeon: [name], MD;  [facility]: [facility] INVASIVE CV LAB;  Service: Cardiovascular;  Laterality: N/A;  . ROTATOR CUFF REPAIR    . SVT ABLATION N/A 11/27/2019   Procedure: SVT ABLATION;  Surgeon: [name], MD;  Location: [facility] INVASIVE CV  LAB;  Service: Cardiovascular;  Laterality: N/A;  . TONSILLECTOMY      There were no vitals filed for this visit.   Subjective Assessment - 10/06/20 0806    Subjective No new complaitns. No new falls or pain to report.    Patient Stated Goals Pt's goals for therapy are to get back to where I was with strength and coordination.    Currently in Pain? No/denies    Pain Score 0-No pain                 [facility] Adult PT Treatment/Exercise - 10/06/20 0808      Transfers   Transfers Sit to Stand;Stand to Sit    Sit to Stand 6: Modified independent (Device/Increase time);Without upper extremity assist;From chair/3-in-1    Stand to Sit 6: Modified independent (Device/Increase time);Without upper extremity assist;To chair/3-in-1      Ambulation/Gait   Ambulation/Gait Yes    Ambulation/Gait Assistance 6: Modified independent (Device/Increase time)    Assistive device None    Gait Pattern Step-through pattern;Decreased stride length;Poor foot clearance - left;Poor foot clearance - right    Ambulation Surface Level;Indoor      High Level Balance   High Level Balance Activities Marching forwards;Marching backwards;Tandem walking   tandem and toe walking fwd/bwd   High Level Balance Comments on  red/blue mat next to counter top: 3 laps each/each way with intermittent to light touch to counter for balance. cues on correct ex form/technique with min guard assist for balance.               Balance Exercises - 10/06/20 0823      Balance Exercises: Standing   Rockerboard Anterior/posterior;Lateral;EO;EC;30 seconds;Other reps (comment);Intermittent UE support;Limitations    Rockerboard Limitations performed both ways on the balance board: rocking the board with emphasis on tall posture with EO, progressing to EC for ~10 reps. then holding the board steady for EC 30 sec's x 3 reps. min guard to min assist needed with cues on posture and weight shifting for balance assistance.    Balance Beam  standing across blue foam beam with no UE support: alternating fwd stepping to floor/back onto the beam, then alternating bwd stepping to floor/back onto beam for ~10 reps each, cues for step length and weight shifting. Min guard assist for safety.    Tandem Gait Forward;Retro;Intermittent upper extremity support;Foam/compliant surface;3 reps;Limitations    Tandem Gait Limitations on blue foam beam for 3 laps each way with intermittent touch to bars for balance.    Sidestepping Foam/compliant support;3 reps;Limitations    Sidestepping Limitations on blue foam beam for 3 laps each way with intermittent touch to bars for balance. cues for step length, to lift feet/not slide them and on posture.               PT Short Term Goals - 10/04/20 0954      PT SHORT TERM GOAL #1   Title Pt will be independent with HEP for improved balance and gait.  TARGET 10/01/2020    Baseline 09/30/20: pt reports doing current HEP with no issues    Status Achieved      PT SHORT TERM GOAL #2   Title Pt to improve by at least 75 ft for improved gait endurance and efficiency.    Baseline 09/30/20: 1197 feet (increased by 200 feet)    Status Achieved      PT SHORT TERM GOAL #3   Title Sensory Organization Test to be assessed, with goal written as appropriate.    Baseline SOT had been deferred by primary PT, but completed 10/04/2020 visit; composite score 29 and all measures decreased compared to normal    Time 4    Period Weeks    Status Achieved      PT SHORT TERM GOAL #4   Title Pt will improve FGA score to at least 22/30 for decreased fall risk.    Baseline 09/30/20: 23/30 scored today    Status Achieved             PT Long Term Goals - 10/04/20 0955      PT LONG TERM GOAL #1   Title Pt will be independent with progression of HEP for improved gait and balance.  TARGET 10/15/2020    Time 6    Period Weeks    Status On-going      PT LONG TERM GOAL #2   Title Pt will negotiate 4 steps with  1 handrail, alternating pattern, modified independently.    Time 6    Period Weeks    Status On-going      PT LONG TERM GOAL #3   Title Pt will verbalize understanding of fall prevention in home environment.    Time 6    Period Weeks    Status On-going  PT LONG TERM GOAL #4   Title Pt will ambulate at least 1000 ft, indoors and outdoors surfaces, independently, no LOB, for improved community ambulation.    Time 6    Period Weeks    Status On-going      PT LONG TERM GOAL #5   Title Sensory Organization Test composite score to improve by at least 10 points for improved balance.    Baseline 29 on 10/04/2020    Time 2    Period Weeks    Status New                 Plan - 10/06/20 0807    Clinical Impression Statement Today's skilled session continued to focus on balance training and ankle strengthening/stability in stance with very few rest breaks needed. No other issues noted or reported. The pt is progressing toward goals and should benefit from continued PT to progress toward unmet goals.    Personal Factors and Comorbidities Comorbidity 3+    Comorbidities diabetes, HTN, morbid obesity, history of polysubstance abuse (cocaine and alcohol)    Examination-Activity Limitations Locomotion Level;Transfers;Stairs    Examination-Participation Restrictions Community Activity;Yard Work    Conservation officer, historic buildings Evolving/Moderate complexity    Rehab Potential Good    PT Frequency 2x / week    PT Duration 6 weeks   plus eval   PT Treatment/Interventions ADLs/Self Care Home Management;Gait training;Stair training;Functional mobility training;Therapeutic activities;Therapeutic exercise;Balance training;Neuromuscular re-education;Patient/family education    PT Next Visit Plan Need to work on hip/ankle/step strategy work for balance, functional strength (step ups, step downs), compliant surfaces for balance reactions/balance strategy work.  Begin working on work simulation  tasks; may need to discuss additional appointments and additional POC.    Consulted and Agree with Plan of Care Patient           Patient will benefit from skilled therapeutic intervention in order to improve the following deficits and impairments:  Abnormal gait,Difficulty walking,Decreased balance,Decreased mobility  Visit Diagnosis: Unsteadiness on feet  Other abnormalities of gait and mobility     Problem List Patient Active Problem List   Diagnosis Date Noted  . Amnesia 09/08/2020  . 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 Acute cerebrovascular accident (CVA) (HCC) 06/01/2020  . Paronychia of right thumb 05/10/2020  . Mediastinal adenopathy 01/06/2020  . Secondary hypercoagulable state (HCC) 10/22/2019  . Acute pancreatitis 01/22/2019  . Class 3 obesity due to excess calories with serious comorbidity and body mass index (BMI) of 40.0 to 44.9 in adult 12/08/2016  . Paroxysmal atrial fibrillation (HCC) 01/23/2013  . Diabetes mellitus (HCC) 01/23/2013  . Hypertension 01/23/2013  . Tobacco user 01/23/2013  . Chronic diastolic heart failure (HCC) 01/21/2013  . 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 Shoulder pain, right 02/15/2011  . Seasonal and perennial allergic rhinitis 02/13/2008  . Dyslipidemia 02/12/2008  . Cocaine abuse (HCC) 02/12/2008  . Obstructive sleep apnea 02/12/2008    Sallyanne Kuster, PTA, [facility] [address] [place], [place] [address] [phone] 10/06/20, 9:06 AM   Name: [name]. MRN: [id number] Date of Birth: 1971-06-08

## 2020-10-12 ENCOUNTER — Other Ambulatory Visit: Payer: Self-pay

## 2020-10-12 ENCOUNTER — Ambulatory Visit: Payer: 59 | Admitting: Physical Therapy

## 2020-10-12 ENCOUNTER — Ambulatory Visit: Payer: 59

## 2020-10-12 ENCOUNTER — Ambulatory Visit: Payer: 59 | Admitting: Occupational Therapy

## 2020-10-12 DIAGNOSIS — R471 Dysarthria and anarthria: Secondary | ICD-10-CM

## 2020-10-12 DIAGNOSIS — R2689 Other abnormalities of gait and mobility: Secondary | ICD-10-CM

## 2020-10-12 DIAGNOSIS — R1310 Dysphagia, unspecified: Secondary | ICD-10-CM

## 2020-10-12 DIAGNOSIS — R2681 Unsteadiness on feet: Secondary | ICD-10-CM

## 2020-10-12 DIAGNOSIS — R278 Other lack of coordination: Secondary | ICD-10-CM

## 2020-10-12 DIAGNOSIS — R41841 Cognitive communication deficit: Secondary | ICD-10-CM

## 2020-10-12 DIAGNOSIS — I69318 Other symptoms and signs involving cognitive functions following cerebral infarction: Secondary | ICD-10-CM

## 2020-10-12 DIAGNOSIS — R41842 Visuospatial deficit: Secondary | ICD-10-CM

## 2020-10-12 NOTE — Therapy (Signed)
[facility] [address] [place], [place], [address] Phone: [phone]   Fax:  [phone]  Occupational Therapy Treatment  Patient Details  Name: [name]. MRN: [id number] Date of Birth: 04/12/1971 No data recorded  Encounter Date: 10/12/2020   OT End of Session - 10/12/20 0823    Visit Number 6    Number of Visits 12    Date for OT Re-Evaluation 10/15/20    Authorization Type UHC    OT Start Time 0804    OT Stop Time 0842    OT Time Calculation (min) 38 min    Activity Tolerance Patient tolerated treatment well    Behavior During Therapy [facility] for tasks assessed/performed           Past Medical History:  Diagnosis Date  . Diabetes mellitus   . GERD (gastroesophageal reflux disease)   . History of alcohol abuse   . History of cocaine use   . History of echocardiogram    a. Echo 4/14: Moderate LVH, vigorous LVEF, EF 65-70%, normal wall motion, grade 2 diastolic dysfunction, mildly dilated aortic root and ascending aorta, ascending aorta 40 mm, aortic root 38 mm, mild LAE  . Hx of cardiovascular stress test    a. GXT 5/14: No ischemic changes  //  b. ETT-Myoview 3/16:  Low risk, no ischemia, EF 58%  . Hyperlipidemia   . Hypertension   . Morbid obesity (HCC)   . Paroxysmal atrial fibrillation (HCC)    Occurring in 2008, with several recurrence since then (including in the setting of + cocaine on UDS).  . Sleep apnea    uses CPAP  . SVT (supraventricular tachycardia) (HCC)    mid to long RP SVT 09/2019    Past Surgical History:  Procedure Laterality Date  . APPENDECTOMY    . ATRIAL FIBRILLATION ABLATION N/A 11/27/2019   Procedure: ATRIAL FIBRILLATION ABLATION;  Surgeon: [name], MD;  [facility]: [facility] INVASIVE CV LAB;  Service: Cardiovascular;  Laterality: N/A;  . ROTATOR CUFF REPAIR    . SVT ABLATION N/A 11/27/2019   Procedure: SVT ABLATION;  Surgeon: [name], MD;  Location: [facility]  INVASIVE CV LAB;  Service: Cardiovascular;  Laterality: N/A;  . TONSILLECTOMY      There were no vitals filed for this visit.   Subjective Assessment - 10/12/20 0842    Subjective  Denies pain    Pertinent History CVA 06/01/20. PMH: HTN, DM, sleep apnea, ETOH and cocaine abuse    Limitations no heavy lifting    Currently in Pain? No/denies                 Treatment: copying small peg design for LUE fine motor coordination and visual skills, mod difficulty v.c, pt confused colors and had difficulty with design, increased time required.  Environmental scanning in a minimally distracting environment 3/16 items missed Tabletop scanning task for number cancellation 18M 60% accuracy                    OT Long Term Goals - 09/16/20 0915      OT LONG TERM GOAL #1   Title Independent with coordination HEP    Time 6    Period Weeks    Status Achieved      OT LONG TERM GOAL #2   Title Improve coordination Lt hand as evidenced by reducing speed on 9 hole peg test to 35 sec or less    Time 6  Period Weeks    Status On-going      OT LONG TERM GOAL #3   Title Pt to perform tabletop scanning at 90% or greater accuracy    Time 6    Period Weeks    Status On-going      OT LONG TERM GOAL #4   Title Pt to perform environmental scanning at 85% accuracy or greater while performing simple physical task and without bumping into objects/furniture/walls    Time 6    Period Weeks    Status On-going      OT LONG TERM GOAL #5   Title Pt to perform simulated work tasks safely    Time 6    Period Weeks    Status New      OT LONG TERM GOAL #6   Title Pt to attend to task for 15 minutes without distraction and remain alert    Time 6    Period Weeks    Status New                 Plan - 10/12/20 0825    Clinical Impression Statement Pt is progressing towards goals, however he had difficulty discriminating colors and copying peg design today.    OT Occupational  Profile and History Problem Focused Assessment - Including review of records relating to presenting problem    Occupational performance deficits (Please refer to evaluation for details): IADL's;Work    Body Structure / Function / Physical Skills IADL;Coordination;FMC;Decreased knowledge of precautions;UE functional use    Cognitive Skills Attention;Safety Awareness    Rehab Potential Good    Clinical Decision Making Several treatment options, min-mod task modification necessary    Comorbidities Affecting Occupational Performance: May have comorbidities impacting occupational performance    Modification or Assistance to Complete Evaluation  No modification of tasks or assist necessary to complete eval    OT Frequency 2x / week    OT Duration 6 weeks   or 12 visits over extended weeks (d/t potential scheduling conflicts)   OT Treatment/Interventions Therapeutic activities;Therapeutic exercise;Cognitive remediation/compensation;Neuromuscular education;Functional Mobility Training;Visual/perceptual remediation/compensation;Patient/family education    Plan work towards goals, tabletop scanning/ coordination    Consulted and Agree with Plan of Care Patient           Patient will benefit from skilled therapeutic intervention in order to improve the following deficits and impairments:   Body Structure / Function / Physical Skills: IADL,Coordination,FMC,Decreased knowledge of precautions,UE functional use Cognitive Skills: Attention,Safety Awareness     Visit Diagnosis: Visuospatial deficit  Other lack of coordination  Unsteadiness on feet  Other symptoms and signs involving cognitive functions following cerebral infarction    Problem List Patient Active Problem List   Diagnosis Date Noted  . Amnesia 09/08/2020  . 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 Acute cerebrovascular accident (CVA) (HCC) 06/01/2020  . Paronychia of right thumb 05/10/2020  . Mediastinal  adenopathy 01/06/2020  . Secondary hypercoagulable state (HCC) 10/22/2019  . Acute pancreatitis 01/22/2019  . Class 3 obesity due to excess calories with serious comorbidity and body mass index (BMI) of 40.0 to 44.9 in adult 12/08/2016  . Paroxysmal atrial fibrillation (HCC) 01/23/2013  . Diabetes mellitus (HCC) 01/23/2013  . Hypertension 01/23/2013  . Tobacco user 01/23/2013  . Chronic diastolic heart failure (HCC) 01/21/2013  . 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 Shoulder pain, right 02/15/2011  . Seasonal and perennial allergic rhinitis 02/13/2008  . Dyslipidemia 02/12/2008  . Cocaine abuse (HCC) 02/12/2008  . Obstructive  sleep apnea 02/12/2008    Aysel Gilchrest 10/12/2020, 8:43 AM  [facility] [address] [place], [place], [address] Phone: [phone]   Fax:  [phone]  Name: Traveion Ruddock. MRN: [id number] Date of Birth: 1971-01-24

## 2020-10-12 NOTE — Therapy (Signed)
[facility] [address] [place], [place], [address] Phone: [phone]   Fax:  [phone]  Speech Language Pathology Treatment  Patient Details  Name: [name]. MRN: [id number] Date of Birth: October 23, 1970 Referring Provider (SLP): Ihor Austin, NP   Encounter Date: 10/12/2020   End of Session - 10/12/20 1732    Visit Number 3    Number of Visits 17    Date for SLP Re-Evaluation 12/31/20    SLP Start Time 0850    SLP Stop Time  0930    SLP Time Calculation (min) 40 min    Activity Tolerance Patient tolerated treatment well           Past Medical History:  Diagnosis Date  . Diabetes mellitus   . GERD (gastroesophageal reflux disease)   . History of alcohol abuse   . History of cocaine use   . History of echocardiogram    a. Echo 4/14: Moderate LVH, vigorous LVEF, EF 65-70%, normal wall motion, grade 2 diastolic dysfunction, mildly dilated aortic root and ascending aorta, ascending aorta 40 mm, aortic root 38 mm, mild LAE  . Hx of cardiovascular stress test    a. GXT 5/14: No ischemic changes  //  b. ETT-Myoview 3/16:  Low risk, no ischemia, EF 58%  . Hyperlipidemia   . Hypertension   . Morbid obesity (HCC)   . Paroxysmal atrial fibrillation (HCC)    Occurring in 2008, with several recurrence since then (including in the setting of + cocaine on UDS).  . Sleep apnea    uses CPAP  . SVT (supraventricular tachycardia) (HCC)    mid to long RP SVT 09/2019    Past Surgical History:  Procedure Laterality Date  . APPENDECTOMY    . ATRIAL FIBRILLATION ABLATION N/A 11/27/2019   Procedure: ATRIAL FIBRILLATION ABLATION;  Surgeon: [name], MD;  [facility];  Service: Cardiovascular;  Laterality: N/A;  . ROTATOR CUFF REPAIR    . SVT ABLATION N/A 11/27/2019   Procedure: SVT ABLATION;  Surgeon: [name], MD;  [facility]: [facility] CV LAB;  Service: Cardiovascular;  Laterality:  N/A;  . TONSILLECTOMY      There were no vitals filed for this visit.   Subjective Assessment - 10/12/20 0857    Subjective ""Oh which one was it? It was the eye and hand lady."    Currently in Pain? No/denies                 ADULT SLP TREATMENT - 10/12/20 0001      General Information   Behavior/Cognition Cooperative;Alert;Pleasant mood      Treatment Provided   Treatment provided Cognitive-Linquistic      Cognitive-Linquistic Treatment   Treatment focused on Cognition    Skilled Treatment SLP began CLQT with pt. Attention, emergent awareness, and attention to detail deficits noted. SLP informed Yeiden of this and provided explanation of these using his performance on assessment as examples. SLP to cont CLQT next session.      Assessment / Recommendations / Plan   Plan Continue with current plan of care      Progression Toward Goals   Progression toward goals Progressing toward goals            SLP Education - 10/12/20 1731    Education Details performance on CLQT (memory, attention, awareness, attention to detail)    Person(s) Educated Patient    Methods Explanation;Demonstration    Comprehension Verbalized understanding  SLP Short Term Goals - 10/12/20 1734      SLP SHORT TERM GOAL #1   Title pt will tell SLP three ways to compensate for his memory in 3 sessions, with modifeid independence    Time 3    Period Weeks    Status On-going      SLP SHORT TERM GOAL #2   Title pt will demonstrate selective attention to simple-mod complex task for 8 mintues in quiet environment in 2 sessions    Time 3    Period Weeks    Status On-going      SLP SHORT TERM GOAL #3   Title pt will demo speech compensations for intelligibility 100% in 10 minutes simple/mod compelx conversation in 2 sessions    Time 3    Period Weeks    Status On-going            SLP Long Term Goals - 10/12/20 1734      SLP LONG TERM GOAL #1   Title pt will demo simple  divided attention skills in functinoal cogntive communcation tasks in 3 sessions    Time 7    Period Weeks   or 17 visits, for all LTGs   Status On-going      SLP LONG TERM GOAL #2   Title pt will report improving swallow skills by telling SLP frequency of cough during liquid consumption is once/month    Time 7    Period Weeks    Status On-going      SLP LONG TERM GOAL #3   Title pt will use memory compensations for appointments, medications, exercise/homework tracking, and/or to-do lists, etc in 5 sessions    Time 7    Period Weeks    Status On-going            Plan - 10/12/20 1732    Clinical Impression Statement Milton Ferguson presents today with a cognitive communication deficit and possible dysphagia ("I choke probably about once every 2 weeks - it used to be a couple times a week."). He provides examples of attention and memory deficits, and reports his speech is dysarthric, although SLP did not hear any dysarthria today. Pt began formal cognitive linguistic testing today, to determine severity of pt's cognitive linguistic defiict. Pt would benefit from skilled ST addressing cognition-communication as well as (possibly) dysarthria in order to return to workforce in some capacity and to better communicate with friends and family. Pt may also require objective swallow exam during this therapy course - although it appears as if dysphagia is resolving spontaneously.    Speech Therapy Frequency 2x / week    Duration 8 weeks   or 17 sessions   Treatment/Interventions Aspiration precaution training;Pharyngeal strengthening exercises;Diet toleration management by SLP;Trials of upgraded texture/liquids;Cueing hierarchy;Cognitive reorganization;Internal/external aids;Patient/family education;Compensatory strategies;SLP instruction and feedback;Functional tasks    Potential to Achieve Goals Good    Potential Considerations Previous level of function;Cooperation/participation level   hx of decr'd  follow through of medical recommendations   Consulted and Agree with Plan of Care Patient           Patient will benefit from skilled therapeutic intervention in order to improve the following deficits and impairments:   Cognitive communication deficit  Dysarthria and anarthria  Dysphagia, unspecified type    Problem List Patient Active Problem List   Diagnosis Date Noted  . Amnesia 09/08/2020  . 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 Acute cerebrovascular accident (CVA) (HCC) 06/01/2020  . Paronychia of  right thumb 05/10/2020  . Mediastinal adenopathy 01/06/2020  . Secondary hypercoagulable state (HCC) 10/22/2019  . Acute pancreatitis 01/22/2019  . Class 3 obesity due to excess calories with serious comorbidity and body mass index (BMI) of 40.0 to 44.9 in adult 12/08/2016  . Paroxysmal atrial fibrillation (HCC) 01/23/2013  . Diabetes mellitus (HCC) 01/23/2013  . Hypertension 01/23/2013  . Tobacco user 01/23/2013  . Chronic diastolic heart failure (HCC) 01/21/2013  . 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 Shoulder pain, right 02/15/2011  . Seasonal and perennial allergic rhinitis 02/13/2008  . Dyslipidemia 02/12/2008  . Cocaine abuse (HCC) 02/12/2008  . Obstructive sleep apnea 02/12/2008    [facility] ,MS, CCC-SLP  10/12/2020, 5:35 PM  [facility] [address] [place], [place], [address] Phone: [phone]   Fax:  [phone]   Name: Shlome Baldree. MRN: [id number] Date of Birth: 1971/03/24

## 2020-10-12 NOTE — Therapy (Signed)
[facility] [address] [place], [place], [address] Phone: [phone]   Fax:  [phone]  Physical Therapy Treatment  Patient Details  Name: [name]. MRN: [id number] Date of Birth: 29-Jul-1971 Referring Provider (PT): Miguel Aschoff, MD   Encounter Date: 10/12/2020   PT End of Session - 10/12/20 1215    Visit Number 9    Number of Visits 13    Authorization Type UHC    PT Start Time [phone]   PT unaware pt was waiting outside of building after speech therapy session   PT Stop Time 1017    PT Time Calculation (min) 35 min    Activity Tolerance Patient tolerated treatment well    Behavior During Therapy [facility] for tasks assessed/performed           Past Medical History:  Diagnosis Date   Diabetes mellitus    GERD (gastroesophageal reflux disease)    History of alcohol abuse    History of cocaine use    History of echocardiogram    a. Echo 4/14: Moderate LVH, vigorous LVEF, EF 65-70%, normal wall motion, grade 2 diastolic dysfunction, mildly dilated aortic root and ascending aorta, ascending aorta 40 mm, aortic root 38 mm, mild LAE   Hx of cardiovascular stress test    a. GXT 5/14: No ischemic changes  //  b. ETT-Myoview 3/16:  Low risk, no ischemia, EF 58%   Hyperlipidemia    Hypertension    Morbid obesity (HCC)    Paroxysmal atrial fibrillation (HCC)    Occurring in 2008, with several recurrence since then (including in the setting of + cocaine on UDS).   Sleep apnea    uses CPAP   SVT (supraventricular tachycardia) (HCC)    mid to long RP SVT 09/2019    Past Surgical History:  Procedure Laterality Date   APPENDECTOMY     ATRIAL FIBRILLATION ABLATION N/A 11/27/2019   Procedure: ATRIAL FIBRILLATION ABLATION;  Surgeon: [name], MD;  [facility]: [facility] INV[facility];  Service: Cardiovascular;  Laterality: N/A;   ROTATOR CUFF REPAIR     SVT ABLATION N/A 11/27/2019    Procedure: SVT ABLATION;  Surgeon: [name], MD;  Location: [facility] INVASIVE CV LAB;  Service: Cardiovascular;  Laterality: N/A;   TONSILLECTOMY      There were no vitals filed for this visit.   Subjective Assessment - 10/12/20 0943    Subjective No pain, no falls.  (Pt was outside taking a break after speech therapy and PT could not find patient; therefore, PT started late).    Patient Stated Goals Pt's goals for therapy are to get back to where I was with strength and coordination.    Currently in Pain? No/denies                             [facility] Adult PT Treatment/Exercise - 10/12/20 0947      Transfers   Transfers Sit to Stand;Stand to Sit    Sit to Stand 6: Modified independent (Device/Increase time);Without upper extremity assist;From chair/3-in-1    Stand to Sit 6: Modified independent (Device/Increase time);Without upper extremity assist;To chair/3-in-1    Number of Reps 10 reps;2 sets   2nd set standing on Airex     Ambulation/Gait   Ambulation/Gait Yes    Ambulation/Gait Assistance 6: Modified independent (Device/Increase time)    Ambulation Distance (Feet) 345 Feet    Assistive device None  Gait Pattern Step-through pattern;Decreased stride length;Poor foot clearance - left;Poor foot clearance - right    Ambulation Surface Level;Indoor    Gait Comments Discussed walking program at home, and verbally discussed walking outside in neighborhood for 5-10 minutes, every other day.  Currently, he is doing this 10 minutes, 1x per week.      Knee/Hip Exercises: Standing   Forward Step Up 10 reps;Hand Hold: 2;Step Height: 6";2 sets               Balance Exercises - 10/12/20 0001      Balance Exercises: Standing   Tandem Gait Forward;Retro;Intermittent upper extremity support;Foam/compliant surface;3 reps;Limitations    Tandem Gait Limitations on blue foam beam for 3 laps each way with intermittent touch to bars for balance.    Sidestepping  Foam/compliant support;3 reps;Limitations    Sidestepping Limitations on blue foam beam for 3 laps each way with intermittent touch to bars for balance. cues for step length, to lift feet/not slide them.    Heel Raises 20 reps;Both    Heel Raises Limitations 2 sets; 2nd set on Airex    Toe Raise Both;20 reps    Toe Raise Limitations 2 sets; 2nd set on Airex    Other Standing Exercises Standing on Airex:  minisquats x 10 reps, then minisquats to up on toes x 10 reps with UE support on parallel bars.  Marching in place x 10 reps, alternating step forward step taps to floor, cues for foot clearance with return to mat.             PT Education - 10/12/20 1214    Education Details Verbally instructed in walking program as part of HEP-try to walk in neighborhood at least every other day, 5-10 minutes    Person(s) Educated Patient    Methods Explanation    Comprehension Verbalized understanding            PT Short Term Goals - 10/04/20 0954      PT SHORT TERM GOAL #1   Title Pt will be independent with HEP for improved balance and gait.  TARGET 10/01/2020    Baseline 09/30/20: pt reports doing current HEP with no issues    Status Achieved      PT SHORT TERM GOAL #2   Title Pt to improve by at least 75 ft for improved gait endurance and efficiency.    Baseline 09/30/20: 1197 feet (increased by 200 feet)    Status Achieved      PT SHORT TERM GOAL #3   Title Sensory Organization Test to be assessed, with goal written as appropriate.    Baseline SOT had been deferred by primary PT, but completed 10/04/2020 visit; composite score 29 and all measures decreased compared to normal    Time 4    Period Weeks    Status Achieved      PT SHORT TERM GOAL #4   Title Pt will improve FGA score to at least 22/30 for decreased fall risk.    Baseline 09/30/20: 23/30 scored today    Status Achieved             PT Long Term Goals - 10/04/20 0955      PT LONG TERM GOAL #1   Title Pt  will be independent with progression of HEP for improved gait and balance.  TARGET 10/15/2020    Time 6    Period Weeks    Status On-going      PT LONG  TERM GOAL #2   Title Pt will negotiate 4 steps with 1 handrail, alternating pattern, modified independently.    Time 6    Period Weeks    Status On-going      PT LONG TERM GOAL #3   Title Pt will verbalize understanding of fall prevention in home environment.    Time 6    Period Weeks    Status On-going      PT LONG TERM GOAL #4   Title Pt will ambulate at least 1000 ft, indoors and outdoors surfaces, independently, no LOB, for improved community ambulation.    Time 6    Period Weeks    Status On-going      PT LONG TERM GOAL #5   Title Sensory Organization Test composite score to improve by at least 10 points for improved balance.    Baseline 29 on 10/04/2020    Time 2    Period Weeks    Status New                 Plan - 10/12/20 1216    Clinical Impression Statement Skilled PT session focused on lower extremity strengthening as well as balance on compliant surfaces.  Pt noted to have decreased plantarflexion strength, as pt unable to lift heel from floor on single limb stance attempts at heel raises.  With balance exercises on compliant surfaces in parallel bars, pt able to lessen UE support, with min guard from therapist and no overt LOB noted.    Personal Factors and Comorbidities Comorbidity 3+    Comorbidities diabetes, HTN, morbid obesity, history of polysubstance abuse (cocaine and alcohol)    Examination-Activity Limitations Locomotion Level;Transfers;Stairs    Examination-Participation Restrictions Community Activity;Yard Work    Conservation officer, historic buildings Evolving/Moderate complexity    Rehab Potential Good    PT Frequency 2x / week    PT Duration 6 weeks   plus eval   PT Treatment/Interventions ADLs/Self Care Home Management;Gait training;Stair training;Functional mobility training;Therapeutic  activities;Therapeutic exercise;Balance training;Neuromuscular re-education;Patient/family education    PT Next Visit Plan Continue to work on hip/ankle/step strategy work for balance, functional strength (step ups, step downs), compliant surfaces for balance reactions/balance strategy work.  Update and progress balance HEP.  PT discussed continuing therapy beyond initial POC, due to decreased balance measures on SOT.  He should have added appts today; PT will complete his renewal first visit next week.    Consulted and Agree with Plan of Care Patient           Patient will benefit from skilled therapeutic intervention in order to improve the following deficits and impairments:  Abnormal gait,Difficulty walking,Decreased balance,Decreased mobility  Visit Diagnosis: Unsteadiness on feet  Other abnormalities of gait and mobility     Problem List Patient Active Problem List   Diagnosis Date Noted   Amnesia 09/08/2020   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   Acute cerebrovascular accident (CVA) (HCC) 06/01/2020   Paronychia of right thumb 05/10/2020   Mediastinal adenopathy 01/06/2020   Secondary hypercoagulable state (HCC) 10/22/2019   Acute pancreatitis 01/22/2019   Class 3 obesity due to excess calories with serious comorbidity and body mass index (BMI) of 40.0 to 44.9 in adult 12/08/2016   Paroxysmal atrial fibrillation (HCC) 01/23/2013   Diabetes mellitus (HCC) 01/23/2013   Hypertension 01/23/2013   Tobacco user 01/23/2013   Chronic diastolic heart failure (HCC) 01/21/2013   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 10/12/2020, 12:22 PM  Gean MaidensMARRIOTT,Jye Fariss W., PT   [facility] [address]  [address] [place], [place], [address] Phone: [phone]   Fax:  [phone]  Name: Raymondo BandLeonidas Claude Ungerer Jr. MRN: [id number] Date of Birth: Jul 04, 1971

## 2020-10-14 ENCOUNTER — Ambulatory Visit: Payer: 59

## 2020-10-14 ENCOUNTER — Ambulatory Visit: Payer: 59 | Admitting: Occupational Therapy

## 2020-10-14 ENCOUNTER — Other Ambulatory Visit: Payer: Self-pay

## 2020-10-14 ENCOUNTER — Ambulatory Visit: Payer: 59 | Admitting: Physical Therapy

## 2020-10-14 ENCOUNTER — Encounter: Payer: Self-pay | Admitting: Physical Therapy

## 2020-10-14 DIAGNOSIS — R278 Other lack of coordination: Secondary | ICD-10-CM

## 2020-10-14 DIAGNOSIS — R471 Dysarthria and anarthria: Secondary | ICD-10-CM

## 2020-10-14 DIAGNOSIS — R2689 Other abnormalities of gait and mobility: Secondary | ICD-10-CM

## 2020-10-14 DIAGNOSIS — R2681 Unsteadiness on feet: Secondary | ICD-10-CM | POA: Diagnosis not present

## 2020-10-14 DIAGNOSIS — I69318 Other symptoms and signs involving cognitive functions following cerebral infarction: Secondary | ICD-10-CM

## 2020-10-14 DIAGNOSIS — R41841 Cognitive communication deficit: Secondary | ICD-10-CM

## 2020-10-14 DIAGNOSIS — R1310 Dysphagia, unspecified: Secondary | ICD-10-CM

## 2020-10-14 DIAGNOSIS — R41842 Visuospatial deficit: Secondary | ICD-10-CM

## 2020-10-14 NOTE — Therapy (Signed)
[facility] [address] [place], [place], [address] Phone: [phone]   Fax:  [phone]  Occupational Therapy Treatment  Patient Details  Name: [name]. MRN: [id number] Date of Birth: August 29, 1971 No data recorded  Encounter Date: 10/14/2020   OT End of Session - 10/14/20 0937    Visit Number 7    Number of Visits 12    Date for OT Re-Evaluation 10/15/20    Authorization Type UHC    OT Start Time [phone]    OT Stop Time 1015    OT Time Calculation (min) 39 min    Activity Tolerance Patient tolerated treatment well    Behavior During Therapy WFL for tasks assessed/performed           Past Medical History:  Diagnosis Date  . Diabetes mellitus   . GERD (gastroesophageal reflux disease)   . History of alcohol abuse   . History of cocaine use   . History of echocardiogram    a. Echo 4/14: Moderate LVH, vigorous LVEF, EF 65-70%, normal wall motion, grade 2 diastolic dysfunction, mildly dilated aortic root and ascending aorta, ascending aorta 40 mm, aortic root 38 mm, mild LAE  . Hx of cardiovascular stress test    a. GXT 5/14: No ischemic changes  //  b. ETT-Myoview 3/16:  Low risk, no ischemia, EF 58%  . Hyperlipidemia   . Hypertension   . Morbid obesity (HCC)   . Paroxysmal atrial fibrillation (HCC)    Occurring in 2008, with several recurrence since then (including in the setting of + cocaine on UDS).  . Sleep apnea    uses CPAP  . SVT (supraventricular tachycardia) (HCC)    mid to long RP SVT 09/2019    Past Surgical History:  Procedure Laterality Date  . APPENDECTOMY    . ATRIAL FIBRILLATION ABLATION N/A 11/27/2019   Procedure: ATRIAL FIBRILLATION ABLATION;  Surgeon: [name], MD;  [facility]: [facility];  Service: Cardiovascular;  Laterality: N/A;  . ROTATOR CUFF REPAIR    . SVT ABLATION N/A 11/27/2019   Procedure: SVT ABLATION;  Surgeon: [name], MD;  Location: [facility]  INVASIVE CV LAB;  Service: Cardiovascular;  Laterality: N/A;  . TONSILLECTOMY      There were no vitals filed for this visit.   Subjective Assessment - 10/14/20 0936    Subjective  been taking powder that MD perscribed that is causing nausea due to too much calcium.  only has to take 1 more time (doesn't remember the name)    Pertinent History CVA 06/01/20. PMH: HTN, DM, sleep apnea, ETOH and cocaine abuse    Limitations no heavy lifting    Currently in Pain? No/denies           Copying small peg design with L hand for incr coordination and copying with 1 v.c. initially due to error, then with good accuracy except demo difficulty and reports that he can't tell difference between orange/red or blue/green and needed incr time.   Constant therapy:  Visual scanning for symbols level 5 with 62% accuracy, 20.91sec average response time.  Then on level 7 with 73%accuracy, and 45.18 sec average response time.   Completing 24-piece puzzle with incr time and min cueing initially for strategy (starting with edge pieces) and then min-mod cueing for problem solving.         OT Long Term Goals - 09/16/20 0915      OT LONG TERM GOAL #1  Title Independent with coordination HEP    Time 6    Period Weeks    Status Achieved      OT LONG TERM GOAL #2   Title Improve coordination Lt hand as evidenced by reducing speed on 9 hole peg test to 35 sec or less    Time 6    Period Weeks    Status On-going      OT LONG TERM GOAL #3   Title Pt to perform tabletop scanning at 90% or greater accuracy    Time 6    Period Weeks    Status On-going      OT LONG TERM GOAL #4   Title Pt to perform environmental scanning at 85% accuracy or greater while performing simple physical task and without bumping into objects/furniture/walls    Time 6    Period Weeks    Status On-going      OT LONG TERM GOAL #5   Title Pt to perform simulated work tasks safely    Time 6    Period Weeks    Status New       OT LONG TERM GOAL #6   Title Pt to attend to task for 15 minutes without distraction and remain alert    Time 6    Period Weeks    Status New                 Plan - 10/14/20 0950    Clinical Impression Statement Pt is progressing towards goals, but continues to demo difficulty with visual perceptual/scanning activities and problem solving    OT Occupational Profile and History Problem Focused Assessment - Including review of records relating to presenting problem    Occupational performance deficits (Please refer to evaluation for details): IADL's;Work    Body Structure / Function / Physical Skills IADL;Coordination;FMC;Decreased knowledge of precautions;UE functional use    Cognitive Skills Attention;Safety Awareness    Rehab Potential Good    Clinical Decision Making Several treatment options, min-mod task modification necessary    Comorbidities Affecting Occupational Performance: May have comorbidities impacting occupational performance    Modification or Assistance to Complete Evaluation  No modification of tasks or assist necessary to complete eval    OT Frequency 2x / week    OT Duration 6 weeks   or 12 visits over extended weeks (d/t potential scheduling conflicts)   OT Treatment/Interventions Therapeutic activities;Therapeutic exercise;Cognitive remediation/compensation;Neuromuscular education;Functional Mobility Training;Visual/perceptual remediation/compensation;Patient/family education    Plan work towards goals, tabletop and environmental scanning/coordination    Consulted and Agree with Plan of Care Patient           Patient will benefit from skilled therapeutic intervention in order to improve the following deficits and impairments:   Body Structure / Function / Physical Skills: IADL,Coordination,FMC,Decreased knowledge of precautions,UE functional use Cognitive Skills: Attention,Safety Awareness     Visit Diagnosis: Visuospatial deficit  Other lack of  coordination  Other symptoms and signs involving cognitive functions following cerebral infarction    Problem List Patient Active Problem List   Diagnosis Date Noted  . Amnesia 09/08/2020  . 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 Acute cerebrovascular accident (CVA) (HCC) 06/01/2020  . Paronychia of right thumb 05/10/2020  . Mediastinal adenopathy 01/06/2020  . Secondary hypercoagulable state (HCC) 10/22/2019  . Acute pancreatitis 01/22/2019  . Class 3 obesity due to excess calories with serious comorbidity and body mass index (BMI) of 40.0 to 44.9 in adult 12/08/2016  . Paroxysmal atrial fibrillation (  HCC) 01/23/2013  . Diabetes mellitus (HCC) 01/23/2013  . Hypertension 01/23/2013  . Tobacco user 01/23/2013  . Chronic diastolic heart failure (HCC) 01/21/2013  . 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 Shoulder pain, right 02/15/2011  . Seasonal and perennial allergic rhinitis 02/13/2008  . Dyslipidemia 02/12/2008  . Cocaine abuse (HCC) 02/12/2008  . Obstructive sleep apnea 02/12/2008    [facility] 10/14/2020, 1:40 PM  [facility] [address] [place], [place], [address] Phone: [phone]   Fax:  [phone]  Name: Yuchen Fedor. MRN: [id number] Date of Birth: 05/05/1971   Willa Frater, OTR/L [facility] [address] [place], [place]  [address] [phone] phone [phone] 10/14/20 1:40 PM

## 2020-10-14 NOTE — Therapy (Signed)
[facility] [address] [place], [place], [address] Phone: [phone]   Fax:  [phone]  Physical Therapy Treatment  Patient Details  Name: [name]. MRN: [id number] Date of Birth: 30-Sep-1971 Referring Provider (PT): Miguel Aschoff, MD   Encounter Date: 10/14/2020   PT End of Session - 10/14/20 0850    Visit Number 10    Number of Visits 13    Authorization Type [employer]    PT Start Time [phone]    PT Stop Time 0930    PT Time Calculation (min) 41 min    Equipment Utilized During Treatment Gait belt    Activity Tolerance Patient tolerated treatment well    Behavior During Therapy [facility] for tasks assessed/performed           Past Medical History:  Diagnosis Date  . Diabetes mellitus   . GERD (gastroesophageal reflux disease)   . History of alcohol abuse   . History of cocaine use   . History of echocardiogram    a. Echo 4/14: Moderate LVH, vigorous LVEF, EF 65-70%, normal wall motion, grade 2 diastolic dysfunction, mildly dilated aortic root and ascending aorta, ascending aorta 40 mm, aortic root 38 mm, mild LAE  . Hx of cardiovascular stress test    a. GXT 5/14: No ischemic changes  //  b. ETT-Myoview 3/16:  Low risk, no ischemia, EF 58%  . Hyperlipidemia   . Hypertension   . Morbid obesity (HCC)   . Paroxysmal atrial fibrillation (HCC)    Occurring in 2008, with several recurrence since then (including in the setting of + cocaine on UDS).  . Sleep apnea    uses CPAP  . SVT (supraventricular tachycardia) (HCC)    mid to long RP SVT 09/2019    Past Surgical History:  Procedure Laterality Date  . APPENDECTOMY    . ATRIAL FIBRILLATION ABLATION N/A 11/27/2019   Procedure: ATRIAL FIBRILLATION ABLATION;  Surgeon: [name], MD;  Location: [facility];  Service: Cardiovascular;  Laterality: N/A;  . ROTATOR CUFF REPAIR    . SVT ABLATION N/A 11/27/2019   Procedure: SVT ABLATION;   Surgeon: [name], MD;  Location: [facility]VAS[facility] CV [facility];  Service: Cardiovascular;  Laterality: N/A;  . TONSILLECTOMY      There were no vitals filed for this visit.   Subjective Assessment - 10/14/20 0849    Subjective No new complaints. No falls or pain to report.    Patient Stated Goals Pt's goals for therapy are to get back to where I was with strength and coordination.    Currently in Pain? No/denies    Pain Score 0-No pain                   [facility] Adult PT Treatment/Exercise - 10/14/20 0851      Transfers   Transfers Sit to Stand;Stand to Sit    Sit to Stand 6: Modified independent (Device/Increase time);Without upper extremity assist;From chair/3-in-1    Stand to Sit 6: Modified independent (Device/Increase time);Without upper extremity assist;To chair/3-in-1      Ambulation/Gait   Ambulation/Gait Yes    Ambulation/Gait Assistance 6: Modified independent (Device/Increase time)    Ambulation Distance (Feet) --   around gym with session   Assistive device None    Gait Pattern Step-through pattern;Decreased stride length;Poor foot clearance - left;Poor foot clearance - right    Ambulation Surface Level;Indoor      Knee/Hip Exercises: Aerobic  Elliptical level 1.0 x 2 minutes forward with UE support. cues on posture.               Balance Exercises - 10/14/20 0907      Balance Exercises: Standing   Rockerboard Anterior/posterior;Lateral;Head turns;EO;EC;30 seconds;Other reps (comment);Intermittent UE support;Limitations    Rockerboard Limitations performed both ways on the balance board: rocking the board with emphasis on tall posture with EO, progressing to EC for ~10 reps. then holding the board steady for EC 30 sec's x 3 reps, progressing to EC head movements left<>right, up<>down for ~10 reps each. min guard to min assist for balance with cues on posture/weight shifitng for balance assistance. intermittent touch to bars as needed for balance as well.     Balance Beam standing across blue foam beam with no UE support: alternating fwd heel taps to floor/back onto the beam, then alternating bwd toe taps to floor/back onto beam for ~10 reps each, cues for step length and weight shifting. Min guard assist for safety.    Tandem Gait Forward;Retro;Intermittent upper extremity support;Foam/compliant surface;3 reps;Limitations    Tandem Gait Limitations on blue foam beam for 3 laps each way with intermittent touch to bars for balance.    Sidestepping Foam/compliant support;3 reps;Limitations    Sidestepping Limitations on blue foam beam for 3 laps each way with intermittent touch to bars for balance. cues for step length, to lift feet/not slide them.    Sit to Stand Standard surface;Without upper extremity support;Foam/compliant surface;Limitations    Sit to Stand Limitations seated at edge of mat with feet across red beam: sit<>stands x 5 reps with no UE support. cues for tall posture and controlled descent with sitting.               PT Short Term Goals - 10/04/20 0954      PT SHORT TERM GOAL #1   Title Pt will be independent with HEP for improved balance and gait.  TARGET 10/01/2020    Baseline 09/30/20: pt reports doing current HEP with no issues    Status Achieved      PT SHORT TERM GOAL #2   Title Pt to improve by at least 75 ft for improved gait endurance and efficiency.    Baseline 09/30/20: 1197 feet (increased by 200 feet)    Status Achieved      PT SHORT TERM GOAL #3   Title Sensory Organization Test to be assessed, with goal written as appropriate.    Baseline SOT had been deferred by primary PT, but completed 10/04/2020 visit; composite score 29 and all measures decreased compared to normal    Time 4    Period Weeks    Status Achieved      PT SHORT TERM GOAL #4   Title Pt will improve FGA score to at least 22/30 for decreased fall risk.    Baseline 09/30/20: 23/30 scored today    Status Achieved             PT  Long Term Goals - 10/04/20 0955      PT LONG TERM GOAL #1   Title Pt will be independent with progression of HEP for improved gait and balance.  TARGET 10/15/2020    Time 6    Period Weeks    Status On-going      PT LONG TERM GOAL #2   Title Pt will negotiate 4 steps with 1 handrail, alternating pattern, modified independently.    Time 6  Period Weeks    Status On-going      PT LONG TERM GOAL #3   Title Pt will verbalize understanding of fall prevention in home environment.    Time 6    Period Weeks    Status On-going      PT LONG TERM GOAL #4   Title Pt will ambulate at least 1000 ft, indoors and outdoors surfaces, independently, no LOB, for improved community ambulation.    Time 6    Period Weeks    Status On-going      PT LONG TERM GOAL #5   Title Sensory Organization Test composite score to improve by at least 10 points for improved balance.    Baseline 29 on 10/04/2020    Time 2    Period Weeks    Status New                 Plan - 10/14/20 0850    Clinical Impression Statement Today's skilled session continued to focus on LE strengthening and balance training with rest needed due to LE fatigue. No other issues noted or reported in session. The pt is progressing toward goals and should benefit from continued PT to progress toward unmet goals.    Personal Factors and Comorbidities Comorbidity 3+    Comorbidities diabetes, HTN, morbid obesity, history of polysubstance abuse (cocaine and alcohol)    Examination-Activity Limitations Locomotion Level;Transfers;Stairs    Examination-Participation Restrictions Community Activity;Yard Work    Conservation officer, historic buildings Evolving/Moderate complexity    Rehab Potential Good    PT Frequency 2x / week    PT Duration 6 weeks   plus eval   PT Treatment/Interventions ADLs/Self Care Home Management;Gait training;Stair training;Functional mobility training;Therapeutic activities;Therapeutic exercise;Balance  training;Neuromuscular re-education;Patient/family education    PT Next Visit Plan check LTGs and sent to primary PT for recert    Consulted and Agree with Plan of Care Patient           Patient will benefit from skilled therapeutic intervention in order to improve the following deficits and impairments:  Abnormal gait,Difficulty walking,Decreased balance,Decreased mobility  Visit Diagnosis: Unsteadiness on feet  Other abnormalities of gait and mobility     Problem List Patient Active Problem List   Diagnosis Date Noted  . Amnesia 09/08/2020  . 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 Acute cerebrovascular accident (CVA) (HCC) 06/01/2020  . Paronychia of right thumb 05/10/2020  . Mediastinal adenopathy 01/06/2020  . Secondary hypercoagulable state (HCC) 10/22/2019  . Acute pancreatitis 01/22/2019  . Class 3 obesity due to excess calories with serious comorbidity and body mass index (BMI) of 40.0 to 44.9 in adult 12/08/2016  . Paroxysmal atrial fibrillation (HCC) 01/23/2013  . Diabetes mellitus (HCC) 01/23/2013  . Hypertension 01/23/2013  . Tobacco user 01/23/2013  . Chronic diastolic heart failure (HCC) 01/21/2013  . 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 Shoulder pain, right 02/15/2011  . Seasonal and perennial allergic rhinitis 02/13/2008  . Dyslipidemia 02/12/2008  . Cocaine abuse (HCC) 02/12/2008  . Obstructive sleep apnea 02/12/2008    Sallyanne Kuster, PTA, [facility] [address], [address] [place], [place] [address] [phone] 10/14/20, 9:41 PM   Name: [name]. MRN: [id number] Date of Birth: September 04, 1971

## 2020-10-14 NOTE — Therapy (Signed)
[facility] [address] [place], [place], [address] Phone: [phone]   Fax:  [phone]  Speech Language Pathology Treatment  Patient Details  Name: [name]. MRN: [id number] Date of Birth: [birth year] Referring Provider (SLP): Ihor Austin, NP   Encounter Date: 10/14/2020   End of Session - 10/14/20 1438    Visit Number 4    Number of Visits 17    Date for SLP Re-Evaluation 12/31/20    SLP Start Time 0805    SLP Stop Time  0845    SLP Time Calculation (min) 40 min    Activity Tolerance Patient tolerated treatment well           Past Medical History:  Diagnosis Date   Diabetes mellitus    GERD (gastroesophageal reflux disease)    History of alcohol abuse    History of cocaine use    History of echocardiogram    a. Echo 4/14: Moderate LVH, vigorous LVEF, EF 65-70%, normal wall motion, grade 2 diastolic dysfunction, mildly dilated aortic root and ascending aorta, ascending aorta 40 mm, aortic root 38 mm, mild LAE   Hx of cardiovascular stress test    a. GXT 5/14: No ischemic changes  //  b. ETT-Myoview 3/16:  Low risk, no ischemia, EF 58%   Hyperlipidemia    Hypertension    Morbid obesity (HCC)    Paroxysmal atrial fibrillation (HCC)    Occurring in 2008, with several recurrence since then (including in the setting of + cocaine on UDS).   Sleep apnea    uses CPAP   SVT (supraventricular tachycardia) (HCC)    mid to long RP SVT 09/2019    Past Surgical History:  Procedure Laterality Date   APPENDECTOMY     ATRIAL FIBRILLATION ABLATION N/A 11/27/2019   Procedure: ATRIAL FIBRILLATION ABLATION;  Surgeon: [name], MD;  Location: [facility];  Service: Cardiovascular;  Laterality: N/A;   ROTATOR CUFF REPAIR     SVT ABLATION N/A 11/27/2019   Procedure: SVT ABLATION;  Surgeon: [name], MD;  Location: [facility];  Service: Cardiovascular;  Laterality:  N/A;   TONSILLECTOMY      There were no vitals filed for this visit.   Subjective Assessment - 10/14/20 1013    Subjective Pt told SLP he thought the maze, memory story, and design generation would have been just as challenging prior to CVA.    Currently in Pain? No/denies                 ADULT SLP TREATMENT - 10/14/20 1015      General Information   Behavior/Cognition Cooperative;Alert;Pleasant mood      Treatment Provided   Treatment provided Cognitive-Linquistic      Cognitive-Linquistic Treatment   Treatment focused on Cognition    Skilled Treatment See "S" about pt's appraisal of CLQT subtests compared pre vs. post CVA. Pt domain scores: attention 42 "(severe), memory 138 (moderate), executive function 14 (severe), language 30 (WNL), visuospatial 43 (moderate), and clock drawing 9 (moderate). Pt composite severity range is moderate deficit, however, SLP believes functionally, pt appears to be with mild-moderate deficits. Pt states his med alarm is working very well - has not missed meds and sister is not "bugging him" about taking his medicatio anymore. Pt cont to complain of dysarthria in light of fatigue ("It's bad if I talk a long time") so SLP to provide dysarthria exercises next session - SLP explained rationale  for this today.      Assessment / Recommendations / Plan   Plan Continue with current plan of care      Progression Toward Goals   Progression toward goals Progressing toward goals              SLP Short Term Goals - 10/14/20 1441      SLP SHORT TERM GOAL #1   Title pt will tell SLP three ways to compensate for his memory in 3 sessions, with modifeid independence    Time 3    Period Weeks    Status On-going      SLP SHORT TERM GOAL #2   Title pt will demonstrate selective attention to simple-mod complex task for 8 mintues in quiet environment in 2 sessions    Baseline 10-14-20    Time 3    Period Weeks    Status On-going      SLP SHORT TERM  GOAL #3   Title pt will demo speech compensations for intelligibility 100% in 15 minutes simple/mod compelx conversation in 2 sessions    Time 3    Period Weeks    Status Revised            SLP Long Term Goals - 10/14/20 1442      SLP LONG TERM GOAL #1   Title pt will demo simple divided attention skills in functinoal cogntive communcation tasks in 3 sessions    Time 7    Period Weeks   or 17 visits, for all LTGs   Status On-going      SLP LONG TERM GOAL #2   Title pt will report improving swallow skills by telling SLP frequency of cough during liquid consumption is once/month    Time 7    Period Weeks    Status On-going      SLP LONG TERM GOAL #3   Title pt will use memory compensations for appointments, medications, exercise/homework tracking, and/or to-do lists, etc in 5 sessions    Time 7    Period Weeks    Status On-going            Plan - 10/14/20 1438    Clinical Impression Statement Dary presents today with a cognitive communication deficit and possible dysphagia ("I choke probably about once every 2 weeks - it used to be a couple times a week."). He provides examples of attention and memory deficits, and reports his speech is dysarthric, SLP again did not hear any dysarthria today but pt cont to tell SLP he experiences dysarthria in xtended conversation so SLP to provide dysarthria HEP. Pt concluded formal cognitive linguistic testing today, SLP believes pt's functional deficit is more mild-moderate rather than moderate. Pt would cont to benefit from skilled ST addressing cognition-communication as well as dysarthria in order to return to workforce in some capacity and to better communicate with friends and family. Pt may also require objective swallow exam during this therapy course - although it appears as if dysphagia is resolving spontaneously.    Speech Therapy Frequency 2x / week    Duration 8 weeks   or 17 sessions   Treatment/Interventions Aspiration  precaution training;Pharyngeal strengthening exercises;Diet toleration management by SLP;Trials of upgraded texture/liquids;Cueing hierarchy;Cognitive reorganization;Internal/external aids;Patient/family education;Compensatory strategies;SLP instruction and feedback;Functional tasks    Potential to Achieve Goals Good    Potential Considerations Previous level of function;Cooperation/participation level   hx of decr'd follow through of medical recommendations   Consulted and Agree with Plan of Care  Patient           Patient will benefit from skilled therapeutic intervention in order to improve the following deficits and impairments:   Cognitive communication deficit  Dysarthria and anarthria  Dysphagia, unspecified type    Problem List Patient Active Problem List   Diagnosis Date Noted   Amnesia 09/08/2020   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   Acute cerebrovascular accident (CVA) (HCC) 06/01/2020   Paronychia of right thumb 05/10/2020   Mediastinal adenopathy 01/06/2020   Secondary hypercoagulable state (HCC) 10/22/2019   Acute pancreatitis 01/22/2019   Class 3 obesity due to excess calories with serious comorbidity and body mass index (BMI) of 40.0 to 44.9 in adult 12/08/2016   Paroxysmal atrial fibrillation (HCC) 01/23/2013   Diabetes mellitus (HCC) 01/23/2013   Hypertension 01/23/2013   Tobacco user 01/23/2013   Chronic diastolic heart failure (HCC) 01/21/2013   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 MRN:  [id number] Date of Birth: October 06, 1971

## 2020-10-19 ENCOUNTER — Ambulatory Visit: Payer: 59 | Admitting: Occupational Therapy

## 2020-10-19 ENCOUNTER — Ambulatory Visit: Payer: 59

## 2020-10-19 ENCOUNTER — Ambulatory Visit: Payer: 59 | Admitting: Physical Therapy

## 2020-10-20 ENCOUNTER — Ambulatory Visit: Payer: 59

## 2020-10-21 ENCOUNTER — Telehealth: Payer: Self-pay | Admitting: Internal Medicine

## 2020-10-21 ENCOUNTER — Ambulatory Visit: Payer: 59 | Admitting: Occupational Therapy

## 2020-10-21 ENCOUNTER — Encounter: Payer: Self-pay | Admitting: Physical Therapy

## 2020-10-21 ENCOUNTER — Ambulatory Visit: Payer: 59 | Attending: Internal Medicine | Admitting: Physical Therapy

## 2020-10-21 ENCOUNTER — Other Ambulatory Visit: Payer: Self-pay

## 2020-10-21 DIAGNOSIS — R278 Other lack of coordination: Secondary | ICD-10-CM

## 2020-10-21 DIAGNOSIS — I69318 Other symptoms and signs involving cognitive functions following cerebral infarction: Secondary | ICD-10-CM | POA: Diagnosis present

## 2020-10-21 DIAGNOSIS — R1310 Dysphagia, unspecified: Secondary | ICD-10-CM | POA: Diagnosis present

## 2020-10-21 DIAGNOSIS — R2689 Other abnormalities of gait and mobility: Secondary | ICD-10-CM | POA: Diagnosis present

## 2020-10-21 DIAGNOSIS — R41842 Visuospatial deficit: Secondary | ICD-10-CM | POA: Insufficient documentation

## 2020-10-21 DIAGNOSIS — R471 Dysarthria and anarthria: Secondary | ICD-10-CM | POA: Diagnosis present

## 2020-10-21 DIAGNOSIS — R41841 Cognitive communication deficit: Secondary | ICD-10-CM | POA: Diagnosis present

## 2020-10-21 DIAGNOSIS — R2681 Unsteadiness on feet: Secondary | ICD-10-CM

## 2020-10-21 MED ORDER — AMPHETAMINE-DEXTROAMPHET ER 30 MG PO CP24: 30.0000 mg | ORAL_CAPSULE | Freq: Every day | ORAL | 0 refills | Status: DC | PRN

## 2020-10-21 NOTE — Patient Instructions (Signed)

## 2020-10-21 NOTE — Telephone Encounter (Signed)
Patient requesting refill of Adderall, he has an apt with you on 10/25/20. Thanks

## 2020-10-21 NOTE — Telephone Encounter (Signed)
Adderall refill sent to [facility]

## 2020-10-21 NOTE — Telephone Encounter (Signed)
Called and spoke with pt and he is aware of rx that has been sent to the pharmacy.  

## 2020-10-21 NOTE — Therapy (Signed)
[facility] [address] [place], [name], [address] Phone: [phone]   Fax:  [phone]  Physical Therapy Treatment/Recert  Patient Details  Name: [name]. MRN: [id number] Date of Birth: [birth year] Referring Provider (PT): Douglas Pou, MD   Encounter Date: 10/21/2020   PT End of Session - 10/21/20 1502    Visit Number 11    Number of Visits 19   per recert 06/21/2228   Authorization Type UHC    PT Start Time 0805    PT Stop Time 0845    PT Time Calculation (min) 40 min    Equipment Utilized During Treatment Gait belt    Activity Tolerance Patient tolerated treatment well    Behavior During Therapy WFL for tasks assessed/performed           Past Medical History:  Diagnosis Date  . Diabetes mellitus   . GERD (gastroesophageal reflux disease)   . History of alcohol abuse   . History of cocaine use   . History of echocardiogram    a. Echo 4/14: Moderate LVH, vigorous LVEF, EF 65-70%, normal wall motion, grade 2 diastolic dysfunction, mildly dilated aortic root and ascending aorta, ascending aorta 40 mm, aortic root 38 mm, mild LAE  . Hx of cardiovascular stress test    a. GXT 5/14: No ischemic changes  //  b. ETT-Myoview 3/16:  Low risk, no ischemia, EF 58%  . Hyperlipidemia   . Hypertension   . Morbid obesity ([place])   . Paroxysmal atrial fibrillation ([name])    Occurring in 2008, with several recurrence since then (including in the setting of + cocaine on UDS).  . Sleep apnea    uses CPAP  . SVT (supraventricular tachycardia) ([name])    mid to long RP SVT 09/2019    Past Surgical History:  Procedure Laterality Date  . APPENDECTOMY    . ATRIAL FIBRILLATION ABLATION N/A 11/27/2019   Procedure: ATRIAL FIBRILLATION ABLATION;  Surgeon: Douglas Grayer, MD;  Location: [facility];  Service: Cardiovascular;  Laterality: N/A;  . ROTATOR CUFF REPAIR    . SVT ABLATION N/A 11/27/2019    Procedure: SVT ABLATION;  Surgeon: Douglas Grayer, MD;  Location: [facility];  Service: Cardiovascular;  Laterality: N/A;  . TONSILLECTOMY      There were no vitals filed for this visit.   Subjective Assessment - 10/21/20 0809    Subjective No new complaints, no falls.  Feel like I'm back to my normal.    Patient Stated Goals Pt's goals for therapy are to get back to where I was with strength and coordination.    Currently in Pain? No/denies                             [facility] Adult PT Treatment/Exercise - 10/21/20 0001      Transfers   Transfers Sit to Stand;Stand to Sit    Sit to Stand 6: Modified independent (Device/Increase time);Without upper extremity assist;From chair/3-in-1    Stand to Sit 6: Modified independent (Device/Increase time);Without upper extremity assist;To chair/3-in-1      Ambulation/Gait   Ambulation/Gait Yes    Ambulation/Gait Assistance 6: Modified independent (Device/Increase time)    Ambulation Distance (Feet) 1250 Feet    Assistive device None    Gait Pattern Step-through pattern;Decreased stride length;Poor foot clearance - left;Poor foot clearance - right    Ambulation Surface Level;Indoor  Gait Comments 1190 ft in 6 minutes      Self-Care   Self-Care Other Self-Care Comments    Other Self-Care Comments  Discussed progress towards goals and deficits continued to be noticed with compliant surfaces with eyes closed.  Discussed PT POC and PT recommends continueing to see pt for additional balance and vestibular work.  Discussed fall prevention education.  Discussed importance of continued, consistent performance of HEP; he is not performing corner balance exercises on compliant surface, so educated pt to stand on pillow for corner balance exercises.            Neuro Re-education: Reviewed HEP: Standing Balance in Corner - 1 x daily - 5 x weekly - 1 sets - 5-10 reps (on compliant surface)             -feet apart EO head  turns/head nods x 10, then EC head turns/head nods x 10 (Pt has LOB posteriorly with EC and needs cues for UE support)             -marching in place x 10             -heel/toe raises x 10 Romberg Stance on Foam Pad - 1 x daily - 5 x weekly - 1-2 sets - 10 reps             -feet together EO head turns/nods x 10; then EC head turns/head nods x 10 (pt has LOB posteriorly with EC and needs cues for UE support)    Balance Exercises - 10/21/20 0001      Balance Exercises: Standing   Tandem Stance Eyes open;Upper extremity support 2;2 reps;30 secs    SLS Eyes open;Upper extremity support 2;2 reps;10 secs;Foam/compliant surface   then 1 UE support x 10 sec   Wall Bumps Hip;Eyes opened;10 reps   2 sets in corner   Heel Raises 20 reps;Both   On airex   Toe Raise Both;20 reps   On Airex            PT Education - 10/21/20 1500    Education Details Fall prevention education; progress towards goals, POC    Person(s) Educated Patient    Methods Explanation;Demonstration    Comprehension Verbalized understanding;Returned demonstration;Verbal cues required            PT Short Term Goals - 10/04/20 0954      PT SHORT TERM GOAL #1   Title Pt will be independent with HEP for improved balance and gait.  TARGET 10/01/2020    Baseline 09/30/20: pt reports doing current HEP with no issues    Status Achieved      PT SHORT TERM GOAL #2   Title Pt to improve 6MWT by at least 75 ft for improved gait endurance and efficiency.    Baseline 09/30/20: 1197 feet (increased by 200 feet)    Status Achieved      PT SHORT TERM GOAL #3   Title Sensory Organization Test to be assessed, with goal written as appropriate.    Baseline SOT had been deferred by primary PT, but completed 10/04/2020 visit; composite score 29 and all measures decreased compared to normal    Time 4    Period Weeks    Status Achieved      PT SHORT TERM GOAL #4   Title Pt will improve FGA score to at least 22/30 for decreased fall  risk.    Baseline 09/30/20: 23/30 scored today    Status Achieved  PT Long Term Goals - 10/21/20 7564      PT LONG TERM GOAL #1   Title Pt will be independent with progression of HEP for improved gait and balance.  TARGET 10/15/2020    Time 6    Period Weeks    Status Not Met      PT LONG TERM GOAL #2   Title Pt will negotiate 4 steps with 1 handrail, alternating pattern, modified independently.    Time 6    Period Weeks    Status Achieved      PT LONG TERM GOAL #3   Title Pt will verbalize understanding of fall prevention in home environment.    Time 6    Period Weeks    Status Achieved      PT LONG TERM GOAL #4   Title Pt will ambulate at least 1000 ft, indoors and outdoors surfaces, independently, no LOB, for improved community ambulation.    Time 6    Period Weeks    Status On-going      PT LONG TERM GOAL #5   Title Sensory Organization Test composite score to improve by at least 10 points for improved balance.    Baseline 29 on 10/04/2020    Time 2    Period Weeks    Status On-going            Updated goals for recert:   PT Long Term Goals - 10/21/20 1526      PT LONG TERM GOAL #1   Title Pt will be independent with final progression of HEP for improved gait and balance.  TARGET 11/19/2020    Time 4    Period Weeks    Status Revised      PT LONG TERM GOAL #2   Title Pt will improve FGA to at least 26/30 for decreased fall risk.    Baseline 23/30 at last check    Time 4    Period Weeks    Status Revised      PT LONG TERM GOAL #3   Title Pt will improve 6 MWT to at least 1300 ft for improved gait efficiency for return to work tasks.    Baseline 1197 ft/1190 ft    Time 4    Period Weeks    Status Revised      PT LONG TERM GOAL #4   Title Pt will ambulate at least 1000 ft, indoors and outdoors surfaces, independently, no LOB, for improved community ambulation.    Time 4    Period Weeks    Status On-going      PT LONG TERM GOAL #5    Title Sensory Organization Test composite score to improve by at least 10 points for improved balance.    Baseline 29 on 10/04/2020    Time 4    Period Weeks    Status On-going               Plan - 10/21/20 1504    Clinical Impression Statement Pt cancelled earlier visit this week due to transportation issues.  Began assessing LTGs this visit, but unable to complete due to time constraints.  Pt has met 2 LTGs thus far, LTG 1 not met for HEP, LTG 2 for stairs, and LTG3 for fall prevention.  LTG 4 and 5 unable to be assessed today.  With HEP, pt is not performing consistently and is not using foam surface at home.  Reviewed HEP and educated in correct and consistent  performance.  Pt is continueing to experience balanc difficulty with SLS on LLE and on compliant surfaces when he has decreased UE support.  He will continue to benefit from skilled PT to further address high level balance and strength for improved overall functional mobility and decreased fall risk.    Personal Factors and Comorbidities Comorbidity 3+    Comorbidities diabetes, HTN, morbid obesity, history of polysubstance abuse (cocaine and alcohol)    Examination-Activity Limitations Locomotion Level;Transfers;Stairs    Examination-Participation Restrictions Community Activity;[place] Work    Stability/Clinical Decision Making Evolving/Moderate complexity    Rehab Potential Good    PT Frequency 2x / week    PT Duration 4 weeks   per recert 10/24/3435   PT Treatment/Interventions ADLs/Self Care Home Management;Gait training;Stair training;Functional mobility training;Therapeutic activities;Therapeutic exercise;Balance training;Neuromuscular re-education;Patient/family education    PT Next Visit Plan Review HEP as needed; continue to work on balance reactions, compliant surfaces and dynamic balance    Consulted and Agree with Plan of Care Patient           Patient will benefit from skilled therapeutic intervention in order to  improve the following deficits and impairments:  Abnormal gait,Difficulty walking,Decreased balance,Decreased mobility  Visit Diagnosis: Unsteadiness on feet  Other abnormalities of gait and mobility     Problem List Patient Active Problem List   Diagnosis Date Noted  . Amnesia 09/08/2020  . 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 Acute cerebrovascular accident (CVA) ([place]) 06/01/2020  . Paronychia of right thumb 05/10/2020  . Mediastinal adenopathy 01/06/2020  . Secondary hypercoagulable state ([place]) 10/22/2019  . Acute pancreatitis 01/22/2019  . Class 3 obesity due to excess calories with serious comorbidity and body mass index (BMI) of 40.0 to 44.9 in adult 12/08/2016  . Paroxysmal atrial fibrillation ([place]) 01/23/2013  . Diabetes mellitus ([place]) 01/23/2013  . Hypertension 01/23/2013  . Tobacco user 01/23/2013  . Chronic diastolic heart failure ([place]) 01/21/2013  . 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 Shoulder pain, right 02/15/2011  . Seasonal and perennial allergic rhinitis 02/13/2008  . Dyslipidemia 02/12/2008  . Cocaine abuse ([place]) 02/12/2008  . Obstructive sleep apnea 02/12/2008    Shawnese Magner W. 10/21/2020, 3:22 PM  Frazier Butt., PT   [facility] [address] [place], [name], [address] Phone: [phone]   Fax:  [phone]  Name: [name]. MRN: [id number] Date of Birth: 1970-11-30

## 2020-10-21 NOTE — Therapy (Signed)
[facility] [address] [place], [place], [address] Phone: [phone]   Fax:  [phone]  Occupational Therapy Treatment  Patient Details  Name: [name]. MRN: [id number] Date of Birth: [birth year] No data recorded  Encounter Date: 10/21/2020   OT End of Session - 10/21/20 0758    Visit Number 8    Number of Visits 12    Date for OT Re-Evaluation 10/15/20    Authorization Type UHC    Authorization Time Period --    OT Start Time 0848    OT Stop Time 0930    OT Time Calculation (min) 42 min    Activity Tolerance Patient tolerated treatment well    Behavior During Therapy WFL for tasks assessed/performed           Past Medical History:  Diagnosis Date  . Diabetes mellitus   . GERD (gastroesophageal reflux disease)   . History of alcohol abuse   . History of cocaine use   . History of echocardiogram    a. Echo 4/14: Moderate LVH, vigorous LVEF, EF 65-70%, normal wall motion, grade 2 diastolic dysfunction, mildly dilated aortic root and ascending aorta, ascending aorta 40 mm, aortic root 38 mm, mild LAE  . Hx of cardiovascular stress test    a. GXT 5/14: No ischemic changes  //  b. ETT-Myoview 3/16:  Low risk, no ischemia, EF 58%  . Hyperlipidemia   . Hypertension   . Morbid obesity ([facility])   . Paroxysmal atrial fibrillation ([facility])    Occurring in 2008, with several recurrence since then (including in the setting of + cocaine on UDS).  . Sleep apnea    uses CPAP  . SVT (supraventricular tachycardia) ([facility])    mid to long RP SVT 09/2019    Past Surgical History:  Procedure Laterality Date  . APPENDECTOMY    . ATRIAL FIBRILLATION ABLATION N/A 11/27/2019   Procedure: ATRIAL FIBRILLATION ABLATION;  Surgeon: [name], MD;  [facility]: [facility];  Service: Cardiovascular;  Laterality: N/A;  . ROTATOR CUFF REPAIR    . SVT ABLATION N/A 11/27/2019   Procedure: SVT ABLATION;  Surgeon:  [name], MD;  Location: [facility];  Service: Cardiovascular;  Laterality: N/A;  . TONSILLECTOMY      There were no vitals filed for this visit.   Subjective Assessment - 10/21/20 0757    Subjective  "same ole"  At end of session, pt reports dyslexia as a child which may be contributing to difficulty    Pertinent History CVA 06/01/20. PMH: HTN, DM, sleep apnea, ETOH and cocaine abuse    Limitations no heavy lifting    Currently in Pain? No/denies           Matching clock faces with digital times for problem solving, visual scanning, and attention.  Pt needed min cueing initially for problem solving and determining hour based on short hand.  Pt with incr time overall.  Constant therapy:  Visual scanning for symbols level 7 with 56% accuracy, and 50.53 sec average response time.   Tabletop visual scanning to copy phone numbers from right side of the page to L side of the page with 77% accuracy (1.41M size).  Pt reports blurriness with this size.  Then 68M number cancellation with 85 % accuracy.   Pt with 1 row completely missed and other errors were "88."   Pt reports hx of dyslexia.  Environmental scanning (simple, static, min distracting environment)  with 14/15 items found on first pass and remaining found on 2nd pass.        OT Long Term Goals - 09/16/20 0915      OT LONG TERM GOAL #1   Title Independent with coordination HEP    Time 6    Period Weeks    Status Achieved      OT LONG TERM GOAL #2   Title Improve coordination Lt hand as evidenced by reducing speed on 9 hole peg test to 35 sec or less    Time 6    Period Weeks    Status On-going      OT LONG TERM GOAL #3   Title Pt to perform tabletop scanning at 90% or greater accuracy    Time 6    Period Weeks    Status On-going      OT LONG TERM GOAL #4   Title Pt to perform environmental scanning at 85% accuracy or greater while performing simple physical task and without bumping into  objects/furniture/walls    Time 6    Period Weeks    Status On-going      OT LONG TERM GOAL #5   Title Pt to perform simulated work tasks safely    Time 6    Period Weeks    Status New      OT LONG TERM GOAL #6   Title Pt to attend to task for 15 minutes without distraction and remain alert    Time 6    Period Weeks    Status New                 Plan - 10/21/20 0758    Clinical Impression Statement Pt is progressing with simple environmental scanning, but continues to demo difficulty with visual perceptual/scanning activities and problem solving.  Pt does better with familiar tasks.  Pt also easily frustrated (?due to decr attention) and needs encouragement to continue tasks at times.  Pt also demo difficulty with smaller print items and numbers (blurriness) and pt reported hx of dyslexia at end of session.    OT Occupational Profile and History Problem Focused Assessment - Including review of records relating to presenting problem    Occupational performance deficits (Please refer to evaluation for details): IADL's;Work    Body Structure / Function / Physical Skills IADL;Coordination;FMC;Decreased knowledge of precautions;UE functional use    Cognitive Skills Attention;Safety Awareness    Rehab Potential Good    Clinical Decision Making Several treatment options, min-mod task modification necessary    Comorbidities Affecting Occupational Performance: May have comorbidities impacting occupational performance    Modification or Assistance to Complete Evaluation  No modification of tasks or assist necessary to complete eval    OT Frequency 2x / week    OT Duration 6 weeks   or 12 visits over extended weeks (d/t potential scheduling conflicts)   OT Treatment/Interventions Therapeutic activities;Therapeutic exercise;Cognitive remediation/compensation;Neuromuscular education;Functional Mobility Training;Visual/perceptual remediation/compensation;Patient/family education    Plan  continue to work towards remaining goals, simulated work tasks, environmental scanning in busy environment (extend goals as pt has not been seen frequency due to schedule conflicts)    Consulted and Agree with Plan of Care Patient           Patient will benefit from skilled therapeutic intervention in order to improve the following deficits and impairments:   Body Structure / Function / Physical Skills: IADL,Coordination,FMC,Decreased knowledge of precautions,UE functional use Cognitive Skills: Attention,Safety Awareness  Visit Diagnosis: Visuospatial deficit  Other lack of coordination  Other symptoms and signs involving cognitive functions following cerebral infarction    Problem List Patient Active Problem List   Diagnosis Date Noted  . Amnesia 09/08/2020  . 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 Acute cerebrovascular accident (CVA) ([place]) 06/01/2020  . Paronychia of right thumb 05/10/2020  . Mediastinal adenopathy 01/06/2020  . Secondary hypercoagulable state ([place]) 10/22/2019  . Acute pancreatitis 01/22/2019  . Class 3 obesity due to excess calories with serious comorbidity and body mass index (BMI) of 40.0 to 44.9 in adult 12/08/2016  . Paroxysmal atrial fibrillation ([place]) 01/23/2013  . Diabetes mellitus ([place]) 01/23/2013  . Hypertension 01/23/2013  . Tobacco user 01/23/2013  . Chronic diastolic heart failure ([place]) 01/21/2013  . 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 Shoulder pain, right 02/15/2011  . Seasonal and perennial allergic rhinitis 02/13/2008  . Dyslipidemia 02/12/2008  . Cocaine abuse ([place]) 02/12/2008  . Obstructive sleep apnea 02/12/2008    [facility] 10/21/2020, 9:37 AM  [facility] [address] [place], [name], [address] Phone: [phone]   Fax:  [phone]  Name: Apolonio Cutting. MRN: [id number] Date of Birth: 23-Mar-1971    Vianne Bulls,  OTR/L [facility] [address] [place], [place]  [address] [phone] phone [phone] 10/21/20 9:37 AM

## 2020-10-24 NOTE — Progress Notes (Signed)
HPI  male smoker followed for management of OSA and narcolepsy with cataplexy, complicated by allergic rhinitis, atrial fibrillation, dCHF, DM, HTN NPSG 11/23/00- AHI 20 per hour. Hypnagogic hallucination associated with dreaming. 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 Vivid dreams as soon as he falls asleep Multiple Sleep Latency Test 10/30/2012-pathologic daytime hypersomnia, nonspecific, compatible with idiopathic hypersomnia or narcolepsy. Mean latency 0.9 minutes with one sleep onset REM event CT chest 12/05/19- mediastinal and bilateral adenopathy, interstitial prominence, ASCVD --------------------------------------------------------------------------------------- .  05/10/20- 50 year old male Smoker followed for management of OSA and narcolepsy with cataplexy, complicated by allergic rhinitis, atrial fibrillation/Xarelto flecainide/ Xarelto, dCHF, Diastolic Dysfunction, CAD, DM2, HTN, Pancreatitis, Obesity, Tobacco use,  CPAP auto 4-17/ [employer]-                                                      Had 2 Phizer Covax Body weight today- 296 lbs ED visit for malaise, dyspnea, N&V on 05/04/20- SARs swab Neg, > Zofran, d ACE level <5 on 01/06/20 CXR 05/04/20- IMPRESSION: No acute process in the chest. There is bilateral hilar prominence likely reflecting adenopathy seen on prior chest CT. 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  Went to ER for malaise and dehydration. 3 days earlier 3/17 he had paronychia lanced R thumb and started doxycycline. Since then has continued malaise, some chilling, some sweats. Denies cough. UTI was questioned and Dr Nehemiah Settle put him on Cipro, started 2 days ago with none taken yet today. Thumb feels some better today.  10/25/20- 50 year old male Smoker followed for management of OSA and narcolepsy with cataplexy,, Mediastinal / Hilar Adenopathy, ILD,  complicated by allergic rhinitis, atrial fibrillation/Xarelto  flecainide/ Xarelto, dCHF, Diastolic Dysfunction, CAD, CVA, DM2, HTN, Pancreatitis, Obesity, Tobacco use,  - Adderall XR 30 mg, 1 daily CPAP auto 4-20/ [employer]- compliance "0%", used 1 day since Dec 10. Body weight today- 312 lbs Covid vax- 2Phizer Flu vax- had -----Patient states that he is tired all the time, also states he had a stroke in August. States he wakes up several times a night Complains CPAP "smothers" him. Unclear if problem is pressure or mask, or both. Since CVA he wakes around MN, awake all night then drowsy all day. CVA affected speech and eyesight. 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  Smoking against advice. Easier DOE since CVA, but minimizes cough/ wheeze.  ROS-see HPI  + = positive Constitutional:   No-   weight loss, night sweats, fevers, +chills,  +fatigue, lassitude. HEENT:   No-  headaches, difficulty swallowing, tooth/dental problems, sore throat,       No-  sneezing, itching, ear ache, + nasal congestion, post nasal drip,  CV:  No-   chest pain, orthopnea, PND, swelling in lower extremities, anasarca, dizziness, palpitations Resp: + shortness of breath with exertion or at rest.              No-   productive cough,  No non-productive cough,  No- coughing up of blood.              No-   change in color of mucus.  No- wheezing.   Skin: No-   rash or lesions. GI:  No-   heartburn, indigestion, abdominal  pain, nausea, vomiting,  GU:  MS:  No-   joint pain or swelling.  Neuro-     +HPI Psych:  No- change in mood or affect. No depression or anxiety.  No memory loss.  OBJ- Physical Exam   General- Alert/ calm, Oriented, Affect-appropriate, Distress- none acute, +morbidly obese, +drowsy Skin- rash-none, lesions- none, excoriation- none Lymphadenopathy- none Head- atraumatic            Eyes- Gross vision intact, PERRLA, conjunctivae and secretions clear            Ears- Hearing, canals-normal             Nose- rhinitis/ turbinate edema, no-Septal dev, mucus, polyps, erosion, perforation             Throat- Mallampati III , mucosa clear , drainage- none, tonsils- atrophic Neck- flexible , trachea midline, no stridor , thyroid nl, carotid no bruit Chest - symmetrical excursion , unlabored           Heart/CV- RRR today, no murmur , no gallop  , no rub, nl s1 s2                           - JVD- none , edema- none, stasis changes- none, varices- none           Lung- clear to P&A, wheeze- none, cough- none , dullness-none, rub- none           Chest wall-  Abd-  Br/ Gen/ Rectal- Not done, not indicated Extrem-

## 2020-10-25 ENCOUNTER — Ambulatory Visit: Payer: 59 | Admitting: Internal Medicine

## 2020-10-25 ENCOUNTER — Other Ambulatory Visit: Payer: Self-pay

## 2020-10-25 ENCOUNTER — Encounter: Payer: Self-pay | Admitting: Internal Medicine

## 2020-10-25 ENCOUNTER — Ambulatory Visit: Payer: 59 | Admitting: Occupational Therapy

## 2020-10-25 VITALS — BP 110/64 | HR 79 | Temp 98.7°F | Ht 69.0 in | Wt 312.2 lb

## 2020-10-25 DIAGNOSIS — I6340 Cerebral infarction due to embolism of unspecified cerebral artery: Secondary | ICD-10-CM

## 2020-10-25 DIAGNOSIS — G4733 Obstructive sleep apnea (adult) (pediatric): Secondary | ICD-10-CM

## 2020-10-25 NOTE — Assessment & Plan Note (Signed)
Now not driving and is working with OT/PT. ' Hopefully he can recover and avoid further insults.

## 2020-10-25 NOTE — Assessment & Plan Note (Addendum)
He has not shown ability to manage his health issues, including diet/ weight. Consider [facility].

## 2020-10-25 NOTE — Assessment & Plan Note (Signed)
We need to get him back on track with CPAP. Emphasized importance with his drowsiness, hx of AFib and CVA. Plan- CPAP/ BIPAP titration, which will include mask refit.

## 2020-10-25 NOTE — Patient Instructions (Signed)
Schedule CPAP/ BIPAP titration seep study  Dx OSA  Try to stay awake in daytime- coffee is fine, so you can sleep at night.  Please call after your sleep study so we can decide whether to change your CPAP machine.

## 2020-10-26 ENCOUNTER — Ambulatory Visit: Payer: 59 | Admitting: Occupational Therapy

## 2020-10-26 ENCOUNTER — Ambulatory Visit: Payer: 59 | Admitting: Physical Therapy

## 2020-10-27 ENCOUNTER — Ambulatory Visit: Payer: 59 | Admitting: Physical Therapy

## 2020-10-27 ENCOUNTER — Other Ambulatory Visit: Payer: 59

## 2020-10-28 ENCOUNTER — Encounter: Payer: 59 | Admitting: Student

## 2020-10-29 ENCOUNTER — Encounter: Payer: Self-pay | Admitting: Physical Therapy

## 2020-10-29 ENCOUNTER — Encounter: Payer: Self-pay | Admitting: Occupational Therapy

## 2020-10-29 ENCOUNTER — Ambulatory Visit: Payer: 59 | Admitting: Occupational Therapy

## 2020-10-29 ENCOUNTER — Ambulatory Visit: Payer: 59

## 2020-10-29 ENCOUNTER — Ambulatory Visit: Payer: 59 | Admitting: Physical Therapy

## 2020-10-29 ENCOUNTER — Other Ambulatory Visit: Payer: Self-pay

## 2020-10-29 VITALS — BP 169/99 | HR 69

## 2020-10-29 VITALS — BP 160/99

## 2020-10-29 DIAGNOSIS — R2681 Unsteadiness on feet: Secondary | ICD-10-CM | POA: Diagnosis not present

## 2020-10-29 DIAGNOSIS — I69318 Other symptoms and signs involving cognitive functions following cerebral infarction: Secondary | ICD-10-CM

## 2020-10-29 DIAGNOSIS — R41842 Visuospatial deficit: Secondary | ICD-10-CM

## 2020-10-29 DIAGNOSIS — R278 Other lack of coordination: Secondary | ICD-10-CM

## 2020-10-29 NOTE — Therapy (Signed)
[facility] [address] [place], [place], [address] Phone: [phone]   Fax:  [phone]  Occupational Therapy Treatment  Patient Details  Name: [name]. MRN: [id number] Date of Birth: [birth year] No data recorded  Encounter Date: 10/29/2020   OT End of Session - 10/29/20 0810    Visit Number 9    Number of Visits 12    Date for OT Re-Evaluation 10/15/20    Authorization Type UHC    OT Start Time 0807    OT Stop Time 0845    OT Time Calculation (min) 38 min           Past Medical History:  Diagnosis Date  . Diabetes mellitus   . GERD (gastroesophageal reflux disease)   . History of alcohol abuse   . History of cocaine use   . History of echocardiogram    a. Echo 4/14: Moderate LVH, vigorous LVEF, EF 65-70%, normal wall motion, grade 2 diastolic dysfunction, mildly dilated aortic root and ascending aorta, ascending aorta 40 mm, aortic root 38 mm, mild LAE  . Hx of cardiovascular stress test    a. GXT 5/14: No ischemic changes  //  b. ETT-Myoview 3/16:  Low risk, no ischemia, EF 58%  . Hyperlipidemia   . Hypertension   . Morbid obesity (HCC)   . Paroxysmal atrial fibrillation (HCC)    Occurring in 2008, with several recurrence since then (including in the setting of + cocaine on UDS).  . Sleep apnea    uses CPAP  . SVT (supraventricular tachycardia) (HCC)    mid to long RP SVT 09/2019    Past Surgical History:  Procedure Laterality Date  . APPENDECTOMY    . ATRIAL FIBRILLATION ABLATION N/A 11/27/2019   Procedure: ATRIAL FIBRILLATION ABLATION;  Surgeon: [name], MD;  [facility]: [facility] IN[facility];  Service: Cardiovascular;  Laterality: N/A;  . ROTATOR CUFF REPAIR    . SVT ABLATION N/A 11/27/2019   Procedure: SVT ABLATION;  Surgeon: [name], MD;  Location: [facility] INV[facility] CV [facility];  Service: Cardiovascular;  Laterality: N/A;  . TONSILLECTOMY      There were no vitals filed for this  visit.   Subjective Assessment - 10/29/20 0809    Subjective  Everything is the same    Pertinent History CVA 06/01/20. PMH: HTN, DM, sleep apnea, ETOH and cocaine abuse    Limitations no heavy lifting    Currently in Pain? No/denies                  Treatment: Environmental scanning: 2/14 items missed first pass for basic scanning in min distracting environment, 86 % accuracy  For alternating attention and scanning,  constant therapy alternating symbols level 7:  90% accuracy , avg response time 53.04 secs  Tabletop 64M number cancellation task: errors on 6/13 rows with omissions or incomplete work 54 % correct                 OT Long Term Goals - 10/29/20 0814      OT LONG TERM GOAL #1   Title Independent with coordination HEP    Time 6    Period Weeks    Status Achieved      OT LONG TERM GOAL #2   Title Improve coordination Lt hand as evidenced by reducing speed on 9 hole peg test to 35 sec or less    Time 6    Period Weeks  Status Achieved   34.41 secs     OT LONG TERM GOAL #3   Title Pt to perform tabletop scanning at 90% or greater accuracy    Time 6    Period Weeks    Status On-going      OT LONG TERM GOAL #4   Title Pt to perform environmental scanning at 85% accuracy or greater while performing simple physical task and without bumping into objects/furniture/walls    Time 6    Period Weeks    Status On-going      OT LONG TERM GOAL #5   Title Pt to perform simulated work tasks safely    Time 6    Period Weeks    Status New      OT LONG TERM GOAL #6   Title Pt to attend to task for 15 minutes without distraction and remain alert    Time 6    Period Weeks    Status On-going                 Plan - 10/29/20 1610    Clinical Impression Statement Pt is progressing with simple environmental scanning, but continues to demo difficulty with tabletop scanning and problem solving. .    OT Occupational Profile and History Problem  Focused Assessment - Including review of records relating to presenting problem    Occupational performance deficits (Please refer to evaluation for details): IADL's;Work    Body Structure / Function / Physical Skills IADL;Coordination;FMC;Decreased knowledge of precautions;UE functional use    Cognitive Skills Attention;Safety Awareness    Rehab Potential Good    Clinical Decision Making Several treatment options, min-mod task modification necessary    Comorbidities Affecting Occupational Performance: May have comorbidities impacting occupational performance    Modification or Assistance to Complete Evaluation  No modification of tasks or assist necessary to complete eval    OT Frequency 2x / week    OT Duration 6 weeks   or 12 visits over extended weeks (d/t potential scheduling conflicts)   OT Treatment/Interventions Therapeutic activities;Therapeutic exercise;Cognitive remediation/compensation;Neuromuscular education;Functional Mobility Training;Visual/perceptual remediation/compensation;Patient/family education    Plan continue to work towards remaining goals, environmental scanning in busy environment (extend goals as pt has not been seen frequency due to schedule conflicts)    Consulted and Agree with Plan of Care Patient           Patient will benefit from skilled therapeutic intervention in order to improve the following deficits and impairments:   Body Structure / Function / Physical Skills: IADL,Coordination,FMC,Decreased knowledge of precautions,UE functional use Cognitive Skills: Attention,Safety Awareness     Visit Diagnosis: Visuospatial deficit  Other lack of coordination  Other symptoms and signs involving cognitive functions following cerebral infarction    Problem List Patient Active Problem List   Diagnosis Date Noted  . Amnesia 09/08/2020  . 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 Acute cerebrovascular accident (CVA) (HCC) 06/01/2020  .  Paronychia of right thumb 05/10/2020  . Mediastinal adenopathy 01/06/2020  . Secondary hypercoagulable state (HCC) 10/22/2019  . Acute pancreatitis 01/22/2019  . Morbid obesity due to excess calories (HCC) 12/08/2016  . Paroxysmal atrial fibrillation (HCC) 01/23/2013  . Diabetes mellitus (HCC) 01/23/2013  . Hypertension 01/23/2013  . Tobacco user 01/23/2013  . Chronic diastolic heart failure (HCC) 01/21/2013  . 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 Shoulder pain, right 02/15/2011  . Seasonal and perennial allergic rhinitis 02/13/2008  . Dyslipidemia 02/12/2008  . Cocaine abuse (HCC) 02/12/2008  .  Obstructive sleep apnea 02/12/2008    [name] 10/29/2020, 8:39 AM  [facility] [address] [place], [place], [address] Phone: [phone]   Fax:  [phone]  Name: [name]. MRN: [id number] Date of Birth: 09-06-1971

## 2020-10-29 NOTE — Therapy (Signed)
[facility] [address] [place], [place], [address] Phone: [phone]   Fax:  [phone]  Physical Therapy Treatment  Patient Details  Name: [name]. MRN: [id number] Date of Birth: Oct 21, 1970 Referring Provider (PT): Miguel Aschoff, MD   Encounter Date: 10/29/2020   PT End of Session - 10/29/20 1648    Visit Number 12    Number of Visits 19   per recert 10/21/2020   Authorization Type UHC    PT Start Time 0849    PT Stop Time 0915    PT Time Calculation (min) 26 min    Activity Tolerance --   Elevated BP measures   Behavior During Therapy WFL for tasks assessed/performed           Past Medical History:  Diagnosis Date  . Diabetes mellitus   . GERD (gastroesophageal reflux disease)   . History of alcohol abuse   . History of cocaine use   . History of echocardiogram    a. Echo 4/14: Moderate LVH, vigorous LVEF, EF 65-70%, normal wall motion, grade 2 diastolic dysfunction, mildly dilated aortic root and ascending aorta, ascending aorta 40 mm, aortic root 38 mm, mild LAE  . Hx of cardiovascular stress test    a. GXT 5/14: No ischemic changes  //  b. ETT-Myoview 3/16:  Low risk, no ischemia, EF 58%  . Hyperlipidemia   . Hypertension   . Morbid obesity (HCC)   . Paroxysmal atrial fibrillation (HCC)    Occurring in 2008, with several recurrence since then (including in the setting of + cocaine on UDS).  . Sleep apnea    uses CPAP  . SVT (supraventricular tachycardia) (HCC)    mid to long RP SVT 09/2019    Past Surgical History:  Procedure Laterality Date  . APPENDECTOMY    . ATRIAL FIBRILLATION ABLATION N/A 11/27/2019   Procedure: ATRIAL FIBRILLATION ABLATION;  Surgeon: [name], MD;  [facility];  Service: Cardiovascular;  Laterality: N/A;  . ROTATOR CUFF REPAIR    . SVT ABLATION N/A 11/27/2019   Procedure: SVT ABLATION;  Surgeon: [name], MD;  [facility]  INVASIVE CV LAB;  Service: Cardiovascular;  Laterality: N/A;  . TONSILLECTOMY      Vitals:   10/29/20 0851 10/29/20 0903 10/29/20 0911  BP: (!) 163/105 (!) 162/97 (!) 169/99  Pulse: 71 69 69     Subjective Assessment - 10/29/20 0851    Subjective Had a bad headache on Tuesday, that's why I didn't come in.  No falls.    Patient Stated Goals Pt's goals for therapy are to get back to where I was with strength and coordination.    Currently in Pain? No/denies                             [facility] Adult PT Treatment/Exercise - 10/29/20 0001      Self-Care   Self-Care Other Self-Care Comments    Other Self-Care Comments  FOTO progress/status assessment completed, with PT asking questions to patient.  Score 56 today on FOTO, which has decreased from 60 at eval.  Pt's BP measures taken today, with elevated measures today (see vitals).  Educated pt verbally and with handouts on HTN and CVA warning signs and risk factors.          Discussed getting BP cuff for home use and f/u with MD regarding BP, as he  reports taking his medication.        PT Education - 10/29/20 1647    Education Details CVA warning signs and risk factors; BP parameters for PT treatment sessions    Person(s) Educated Patient    Methods Explanation;Handout    Comprehension Verbalized understanding            PT Short Term Goals - 10/04/20 0954      PT SHORT TERM GOAL #1   Title Pt will be independent with HEP for improved balance and gait.  TARGET 10/01/2020    Baseline 09/30/20: pt reports doing current HEP with no issues    Status Achieved      PT SHORT TERM GOAL #2   Title Pt to improve by at least 75 ft for improved gait endurance and efficiency.    Baseline 09/30/20: 1197 feet (increased by 200 feet)    Status Achieved      PT SHORT TERM GOAL #3   Title Sensory Organization Test to be assessed, with goal written as appropriate.    Baseline SOT had been deferred by primary PT,  but completed 10/04/2020 visit; composite score 29 and all measures decreased compared to normal    Time 4    Period Weeks    Status Achieved      PT SHORT TERM GOAL #4   Title Pt will improve FGA score to at least 22/30 for decreased fall risk.    Baseline 09/30/20: 23/30 scored today    Status Achieved             PT Long Term Goals - 10/21/20 1526      PT LONG TERM GOAL #1   Title Pt will be independent with final progression of HEP for improved gait and balance.  TARGET 11/19/2020    Time 4    Period Weeks    Status Revised      PT LONG TERM GOAL #2   Title Pt will improve FGA to at least 26/30 for decreased fall risk.    Baseline 23/30 at last check    Time 4    Period Weeks    Status Revised      PT LONG TERM GOAL #3   Title Pt will improve 6 MWT to at least 1300 ft for improved gait efficiency for return to work tasks.    Baseline 1197 ft/1190 ft    Time 4    Period Weeks    Status Revised      PT LONG TERM GOAL #4   Title Pt will ambulate at least 1000 ft, indoors and outdoors surfaces, independently, no LOB, for improved community ambulation.    Time 4    Period Weeks    Status On-going      PT LONG TERM GOAL #5   Title Sensory Organization Test composite score to improve by at least 10 points for improved balance.    Baseline 29 on 10/04/2020    Time 4    Period Weeks    Status On-going                 Plan - 10/29/20 1649    Clinical Impression Statement Treatment session limited today to education on CVA warning signs and symtpoms as well as FOTO status questionaire.  Pt's functional status has decreased to 56 from 60 at initial eval.  Unable to fully perform PT session with elevated BP measures.    Personal Factors and Comorbidities Comorbidity 3+  Comorbidities diabetes, HTN, morbid obesity, history of polysubstance abuse (cocaine and alcohol)    Examination-Activity Limitations Locomotion Level;Transfers;Stairs     Examination-Participation Restrictions Community Activity;Yard Work    Stability/Clinical Decision Making Evolving/Moderate complexity    Rehab Potential Good    PT Frequency 2x / week    PT Duration 4 weeks   per recert 10/21/2020   PT Treatment/Interventions ADLs/Self Care Home Management;Gait training;Stair training;Functional mobility training;Therapeutic activities;Therapeutic exercise;Balance training;Neuromuscular re-education;Patient/family education    PT Next Visit Plan Review HEP as needed; continue to work on balance reactions, compliant surfaces and dynamic balance.  Please check BP measures before treatment    Consulted and Agree with Plan of Care Patient           Patient will benefit from skilled therapeutic intervention in order to improve the following deficits and impairments:  Abnormal gait,Difficulty walking,Decreased balance,Decreased mobility  Visit Diagnosis: Unsteadiness on feet     Problem List Patient Active Problem List   Diagnosis Date Noted  . Amnesia 09/08/2020  . 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 Acute cerebrovascular accident (CVA) (HCC) 06/01/2020  . Paronychia of right thumb 05/10/2020  . Mediastinal adenopathy 01/06/2020  . Secondary hypercoagulable state (HCC) 10/22/2019  . Acute pancreatitis 01/22/2019  . Morbid obesity due to excess calories (HCC) 12/08/2016  . Paroxysmal atrial fibrillation (HCC) 01/23/2013  . Diabetes mellitus (HCC) 01/23/2013  . Hypertension 01/23/2013  . Tobacco user 01/23/2013  . Chronic diastolic heart failure (HCC) 01/21/2013  . 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 Shoulder pain, right 02/15/2011  . Seasonal and perennial allergic rhinitis 02/13/2008  . Dyslipidemia 02/12/2008  . Cocaine abuse (HCC) 02/12/2008  . Obstructive sleep apnea 02/12/2008    Khalon Cansler W. 10/29/2020, 4:51 PM  Gean Maidens., PT   [facility] [address] [place], [place], [address] Phone: [phone]   Fax:  [phone]  Name: [name]. MRN: [id number] Date of Birth: 08/20/1971

## 2020-11-03 ENCOUNTER — Ambulatory Visit: Payer: 59

## 2020-11-03 ENCOUNTER — Encounter: Payer: Self-pay | Admitting: Physical Therapy

## 2020-11-03 ENCOUNTER — Ambulatory Visit: Payer: 59 | Admitting: Physical Therapy

## 2020-11-03 ENCOUNTER — Other Ambulatory Visit: Payer: Self-pay

## 2020-11-03 ENCOUNTER — Encounter: Payer: 59 | Admitting: Student

## 2020-11-03 ENCOUNTER — Ambulatory Visit: Payer: 59 | Admitting: Occupational Therapy

## 2020-11-03 ENCOUNTER — Other Ambulatory Visit: Payer: Self-pay | Admitting: Internal Medicine

## 2020-11-03 VITALS — BP 151/89 | HR 73

## 2020-11-03 DIAGNOSIS — R2689 Other abnormalities of gait and mobility: Secondary | ICD-10-CM

## 2020-11-03 DIAGNOSIS — I480 Paroxysmal atrial fibrillation: Secondary | ICD-10-CM

## 2020-11-03 DIAGNOSIS — R2681 Unsteadiness on feet: Secondary | ICD-10-CM | POA: Diagnosis not present

## 2020-11-03 NOTE — Telephone Encounter (Signed)
Xarelto 20mg  refill request received. Pt is 50 years old, weight-141.6kg, Crea-1.10 on 09/27/2020, last seen by Dr. 09/29/2020 on 03/29/2020 via Telemedicine, Diagnosis-Afib, CrCl-162.17ml/min; Dose is appropriate based on dosing criteria. Will send in refill to requested pharmacy.

## 2020-11-03 NOTE — Therapy (Signed)
[facility] [place] [address] [place], [place], [address] Phone: [phone]   Fax:  [phone]  Physical Therapy Treatment  Patient Details  Name: [name]. MRN: [id number] Date of Birth: 1971/07/23 Referring Provider (PT): Miguel Aschoff, MD   Encounter Date: 11/03/2020   PT End of Session - 11/03/20 1323    Visit Number 13    Number of Visits 19   per recert 10/21/2020   Authorization Type UHC    PT Start Time 1317    PT Stop Time 1356    PT Time Calculation (min) 39 min    Equipment Utilized During Treatment Gait belt    Activity Tolerance Patient tolerated treatment well;No increased pain    Behavior During Therapy WFL for tasks assessed/performed           Past Medical History:  Diagnosis Date  . Diabetes mellitus   . GERD (gastroesophageal reflux disease)   . History of alcohol abuse   . History of cocaine use   . History of echocardiogram    a. Echo 4/14: Moderate LVH, vigorous LVEF, EF 65-70%, normal wall motion, grade 2 diastolic dysfunction, mildly dilated aortic root and ascending aorta, ascending aorta 40 mm, aortic root 38 mm, mild LAE  . Hx of cardiovascular stress test    a. GXT 5/14: No ischemic changes  //  b. ETT-Myoview 3/16:  Low risk, no ischemia, EF 58%  . Hyperlipidemia   . Hypertension   . Morbid obesity (HCC)   . Paroxysmal atrial fibrillation (HCC)    Occurring in 2008, with several recurrence since then (including in the setting of + cocaine on UDS).  . Sleep apnea    uses CPAP  . SVT (supraventricular tachycardia) (HCC)    mid to long RP SVT 09/2019    Past Surgical History:  Procedure Laterality Date  . APPENDECTOMY    . ATRIAL FIBRILLATION ABLATION N/A 11/27/2019   Procedure: ATRIAL FIBRILLATION ABLATION;  Surgeon: [name], MD;  [facility];  Service: Cardiovascular;  Laterality: N/A;  . ROTATOR CUFF REPAIR    . SVT ABLATION N/A  11/27/2019   Procedure: SVT ABLATION;  Surgeon: [name], MD;  [facility]: [facility];  Service: Cardiovascular;  Laterality: N/A;  . TONSILLECTOMY      Vitals:   11/03/20 1321 11/03/20 1342 11/03/20 1353  BP: 139/86 (!) 154/91 (!) 151/89  Pulse: 72 70 73     Subjective Assessment - 11/03/20 1321    Subjective No new complaints. No falls or pain to report. See's the doctor today. To talk to him about getting a BP cuff for home as he does not have one or any way to monitor his BP at home.    Patient Stated Goals Pt's goals for therapy are to get back to where I was with strength and coordination.    Currently in Pain? No/denies    Pain Score 0-No pain                [facility] Adult PT Treatment/Exercise - 11/03/20 1325      Transfers   Transfers Sit to Stand;Stand to Sit    Sit to Stand 6: Modified independent (Device/Increase time);Without upper extremity assist;From chair/3-in-1    Stand to Sit 6: Modified independent (Device/Increase time);Without upper extremity assist;To chair/3-in-1      Ambulation/Gait   Ambulation/Gait Yes    Ambulation/Gait Assistance 6: Modified independent (Device/Increase time)  Assistive device None    Gait Pattern Step-through pattern;Decreased stride length;Poor foot clearance - left;Poor foot clearance - right    Ambulation Surface Level;Indoor      Neuro Re-ed    Neuro Re-ed Details  for balance/muscle re-ed: on blue mat over ramp:  facing up the ramp- alternating fwd/bwd stepping for ~10 reps each side, then with wide stance for EC 30 sec's x 3 reps, progressing to EC head movements left<>right, up<>down for ~10 reps each. min to mod assist for balance with cues on posture/weight shifting to assist with balance recovery;  facing down the ramp- alternating fwd/bwd stepping for ~6-7 reps each side with mod assist, occasional touch to mirror/chair for balance, cues on balance. then with wide stance for EC 30 sec's x 3 reps, progressing to  EC head movements left<>right, then up<>donw for ~10 reps each with min to mod assist for balance. cues on posture and weight shifting for balance.      Knee/Hip Exercises: Aerobic   Other Aerobic Scifit level 2.0 with UE/LE's for 6 minutes with goal >/= 65 rpm for activity tolerance and strengthening.                    PT Short Term Goals - 10/04/20 0954      PT SHORT TERM GOAL #1   Title Pt will be independent with HEP for improved balance and gait.  TARGET 10/01/2020    Baseline 09/30/20: pt reports doing current HEP with no issues    Status Achieved      PT SHORT TERM GOAL #2   Title Pt to improve by at least 75 ft for improved gait endurance and efficiency.    Baseline 09/30/20: 1197 feet (increased by 200 feet)    Status Achieved      PT SHORT TERM GOAL #3   Title Sensory Organization Test to be assessed, with goal written as appropriate.    Baseline SOT had been deferred by primary PT, but completed 10/04/2020 visit; composite score 29 and all measures decreased compared to normal    Time 4    Period Weeks    Status Achieved      PT SHORT TERM GOAL #4   Title Pt will improve FGA score to at least 22/30 for decreased fall risk.    Baseline 09/30/20: 23/30 scored today    Status Achieved             PT Long Term Goals - 10/21/20 1526      PT LONG TERM GOAL #1   Title Pt will be independent with final progression of HEP for improved gait and balance.  TARGET 11/19/2020    Time 4    Period Weeks    Status Revised      PT LONG TERM GOAL #2   Title Pt will improve FGA to at least 26/30 for decreased fall risk.    Baseline 23/30 at last check    Time 4    Period Weeks    Status Revised      PT LONG TERM GOAL #3   Title Pt will improve 6 MWT to at least 1300 ft for improved gait efficiency for return to work tasks.    Baseline 1197 ft/1190 ft    Time 4    Period Weeks    Status Revised      PT LONG TERM GOAL #4   Title Pt will ambulate at least  1000 ft, indoors and  outdoors surfaces, independently, no LOB, for improved community ambulation.    Time 4    Period Weeks    Status On-going      PT LONG TERM GOAL #5   Title Sensory Organization Test composite score to improve by at least 10 points for improved balance.    Baseline 29 on 10/04/2020    Time 4    Period Weeks    Status On-going                 Plan - 11/03/20 1323    Clinical Impression Statement Today's skilled session was able to focus on strengthening, activity tolerance and balance training with BP staying within stable values to allow for PT to continue. Rest breaks taken due to fatigue. No other issues noted or reported in session. The pt is progressing toward goals and should benefit from continued PT to progress toward unmet goals.    Personal Factors and Comorbidities Comorbidity 3+    Comorbidities diabetes, HTN, morbid obesity, history of polysubstance abuse (cocaine and alcohol)    Examination-Activity Limitations Locomotion Level;Transfers;Stairs    Examination-Participation Restrictions Community Activity;Yard Work    Stability/Clinical Decision Making Evolving/Moderate complexity    Rehab Potential Good    PT Frequency 2x / week    PT Duration 4 weeks   per recert 10/21/2020   PT Treatment/Interventions ADLs/Self Care Home Management;Gait training;Stair training;Functional mobility training;Therapeutic activities;Therapeutic exercise;Balance training;Neuromuscular re-education;Patient/family education    PT Next Visit Plan Review HEP as needed; continue to work on balance reactions, compliant surfaces and dynamic balance.  Please check BP measures before treatment    Consulted and Agree with Plan of Care Patient           Patient will benefit from skilled therapeutic intervention in order to improve the following deficits and impairments:  Abnormal gait,Difficulty walking,Decreased balance,Decreased mobility  Visit Diagnosis: Unsteadiness on  feet  Other abnormalities of gait and mobility     Problem List Patient Active Problem List   Diagnosis Date Noted  . Amnesia 09/08/2020  . 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 Acute cerebrovascular accident (CVA) (HCC) 06/01/2020  . Paronychia of right thumb 05/10/2020  . Mediastinal adenopathy 01/06/2020  . Secondary hypercoagulable state (HCC) 10/22/2019  . Acute pancreatitis 01/22/2019  . Morbid obesity due to excess calories (HCC) 12/08/2016  . Paroxysmal atrial fibrillation (HCC) 01/23/2013  . Diabetes mellitus (HCC) 01/23/2013  . Hypertension 01/23/2013  . Tobacco user 01/23/2013  . Chronic diastolic heart failure (HCC) 01/21/2013  . 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 Shoulder pain, right 02/15/2011  . Seasonal and perennial allergic rhinitis 02/13/2008  . Dyslipidemia 02/12/2008  . Cocaine abuse (HCC) 02/12/2008  . Obstructive sleep apnea 02/12/2008    Sallyanne Kuster, PTA, [facility] [address], [address] [place], [place] [address] [phone] 11/04/20, 8:52 AM   Name: Darrold Tripplett. MRN: [id number] Date of Birth: Aug 10, 1971

## 2020-11-05 ENCOUNTER — Ambulatory Visit: Payer: 59

## 2020-11-05 ENCOUNTER — Ambulatory Visit: Payer: 59 | Admitting: Physical Therapy

## 2020-11-05 ENCOUNTER — Ambulatory Visit: Payer: 59 | Admitting: Occupational Therapy

## 2020-11-05 ENCOUNTER — Other Ambulatory Visit: Payer: Self-pay

## 2020-11-05 ENCOUNTER — Encounter: Payer: Self-pay | Admitting: Physical Therapy

## 2020-11-05 VITALS — BP 124/92

## 2020-11-05 DIAGNOSIS — R2681 Unsteadiness on feet: Secondary | ICD-10-CM

## 2020-11-05 DIAGNOSIS — R471 Dysarthria and anarthria: Secondary | ICD-10-CM

## 2020-11-05 DIAGNOSIS — I69318 Other symptoms and signs involving cognitive functions following cerebral infarction: Secondary | ICD-10-CM

## 2020-11-05 DIAGNOSIS — R41841 Cognitive communication deficit: Secondary | ICD-10-CM

## 2020-11-05 DIAGNOSIS — R41842 Visuospatial deficit: Secondary | ICD-10-CM

## 2020-11-05 DIAGNOSIS — R2689 Other abnormalities of gait and mobility: Secondary | ICD-10-CM

## 2020-11-05 DIAGNOSIS — R278 Other lack of coordination: Secondary | ICD-10-CM

## 2020-11-05 DIAGNOSIS — R1310 Dysphagia, unspecified: Secondary | ICD-10-CM

## 2020-11-05 NOTE — Patient Instructions (Signed)
    Start taking your Adderall again! See if it helps you pay attention and focus more, like it did when you were taking it before.

## 2020-11-05 NOTE — Therapy (Signed)
[facility] [address] [place], [place], [address] Phone: [phone]   Fax:  [phone]  Speech Language Pathology Treatment  Patient Details  Name: [name]. MRN: [id number] Date of Birth: 07-23-1971 Referring Provider (SLP): Ihor Austin, NP   Encounter Date: 11/05/2020   End of Session - 11/05/20 1549    Visit Number 5    Number of Visits 17    Date for SLP Re-Evaluation 12/31/20    SLP Start Time 1025    SLP Stop Time  1105    SLP Time Calculation (min) 40 min    Activity Tolerance Patient tolerated treatment well           Past Medical History:  Diagnosis Date  . Diabetes mellitus   . GERD (gastroesophageal reflux disease)   . History of alcohol abuse   . History of cocaine use   . History of echocardiogram    a. Echo 4/14: Moderate LVH, vigorous LVEF, EF 65-70%, normal wall motion, grade 2 diastolic dysfunction, mildly dilated aortic root and ascending aorta, ascending aorta 40 mm, aortic root 38 mm, mild LAE  . Hx of cardiovascular stress test    a. GXT 5/14: No ischemic changes  //  b. ETT-Myoview 3/16:  Low risk, no ischemia, EF 58%  . Hyperlipidemia   . Hypertension   . Morbid obesity (HCC)   . Paroxysmal atrial fibrillation (HCC)    Occurring in 2008, with several recurrence since then (including in the setting of + cocaine on UDS).  . Sleep apnea    uses CPAP  . SVT (supraventricular tachycardia) (HCC)    mid to long RP SVT 09/2019    Past Surgical History:  Procedure Laterality Date  . APPENDECTOMY    . ATRIAL FIBRILLATION ABLATION N/A 11/27/2019   Procedure: ATRIAL FIBRILLATION ABLATION;  Surgeon: [name], MD;  [facility];  Service: Cardiovascular;  Laterality: N/A;  . ROTATOR CUFF REPAIR    . SVT ABLATION N/A 11/27/2019   Procedure: SVT ABLATION;  Surgeon: [name], MD;  [facility]: [facility];  Service: Cardiovascular;  Laterality:  N/A;  . TONSILLECTOMY      There were no vitals filed for this visit.   Subjective Assessment - 11/05/20 1037    Subjective "I can go into the store for a Diet Pepsi, but walk out wihtout the Diet Pepsi. I think it's because I'm talking."                 ADULT SLP TREATMENT - 11/05/20 1042      General Information   Behavior/Cognition Cooperative;Alert;Pleasant mood      Treatment Provided   Treatment provided Cognitive-Linquistic      Cognitive-Linquistic Treatment   Treatment focused on Cognition    Skilled Treatment Alarms for meds continues to be successful since last ST visit. Pt tells SLP he is frustrated because he can't remember where he parks his car. SLP assisted pt reason through what might be some good ways to recall where his car is parked/sister is waiting with the car. After occasional min A pt came to conclusion he could ask sister to park in ONE place and not move the car so that he knows exactly where the car is all the time he exits the store. [name] then shared with SLP he has "always been absent-mided", even before the stroke - e.g., he has left the $ he was going to pay for his  items on the grocery shelf when looking at grocery items. Pt had been prescribed Adderall in the past: "When I was on the Adderall everything seemed to focus." I don't take it like I should-when I was working I took it every day and I was able to work better." SLP strongly encouraged pt to begin taking his Adderall again.      Assessment / Recommendations / Plan   Plan Continue with current plan of care   consider reducing frequency if pt begins taking Adderall and attention improves     Progression Toward Goals   Progression toward goals Progressing toward goals            SLP Education - 11/05/20 1549    Education Details Adderall that pt was prescribed will help with focus like it did when pt was taking it before    Person(s) Educated Patient    Methods Explanation     Comprehension Verbalized understanding            SLP Short Term Goals - 11/05/20 1553      SLP SHORT TERM GOAL #1   Title pt will tell SLP three ways to compensate for his memory in 3 sessions, with modifeid independence    Baseline 11-05-20    Time 2    Period Weeks    Status On-going      SLP SHORT TERM GOAL #2   Title pt will demonstrate selective attention to simple-mod complex task for 8 mintues in quiet environment in 2 sessions    Baseline 10-14-20, 11-05-20    Time 2    Period Weeks    Status On-going      SLP SHORT TERM GOAL #3   Title pt will demo speech compensations for intelligibility 100% in 15 minutes simple/mod compelx conversation in 2 sessions    Time 2    Period Weeks    Status On-going            SLP Long Term Goals - 11/05/20 1554      SLP LONG TERM GOAL #1   Title pt will demo simple divided attention skills in functinoal cogntive communcation tasks in 3 sessions    Time 6    Period Weeks   or 17 visits, for all LTGs   Status On-going      SLP LONG TERM GOAL #2   Title pt will report improving swallow skills by telling SLP frequency of cough during liquid consumption is once/month    Time 6    Period Weeks    Status On-going      SLP LONG TERM GOAL #3   Title pt will use memory compensations for appointments, medications, exercise/homework tracking, and/or to-do lists, etc in 5 sessions    Baseline 11-05-20 (meds)    Time 6    Period Weeks    Status On-going            Plan - 11/05/20 1549    Clinical Impression Statement [name] presents today with a cognitive communication deficit and possible dysphagia ("I choke probably about once every 2 weeks - it used to be a couple times a week."). He provides examples of attention and memory deficits, and reports his speech is dysarthric, In extended conversation today, SLP did not hear any dysarthria - SLP did not provide pt dysarthria HEP.  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  Pt would cont to benefit from skilled ST addressing cognition-communication in order to return to workforce in some capacity and to better communicate with friends and family. Goals may change if pt's attention improves with pt begins taking his prescribed Adderall. Pt may also require objective swallow exam during this therapy course - although it appears as if dysphagia is resolving spontaneously.    Speech Therapy Frequency 2x / week    Duration 8 weeks   or 17 sessions   Treatment/Interventions Aspiration precaution training;Pharyngeal strengthening exercises;Diet toleration management by SLP;Trials of upgraded texture/liquids;Cueing hierarchy;Cognitive reorganization;Internal/external aids;Patient/family education;Compensatory strategies;SLP instruction and feedback;Functional tasks    Potential to Achieve Goals Good    Potential Considerations Previous level of function;Cooperation/participation level   hx of decr'd follow through of medical recommendations   Consulted and Agree with Plan of Care Patient           Patient will benefit from skilled therapeutic intervention in order to improve the following deficits and impairments:   Cognitive communication deficit  Dysarthria and anarthria  Dysphagia, unspecified type    Problem List Patient Active Problem List   Diagnosis Date Noted  . Amnesia 09/08/2020  . 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 Acute cerebrovascular accident (CVA) (HCC) 06/01/2020  . Paronychia of right thumb 05/10/2020  . Mediastinal adenopathy 01/06/2020  . Secondary hypercoagulable state (HCC) 10/22/2019  . Acute pancreatitis 01/22/2019  . Morbid obesity due to excess calories (HCC) 12/08/2016  . Paroxysmal atrial fibrillation (HCC) 01/23/2013  . Diabetes mellitus (HCC) 01/23/2013  . Hypertension 01/23/2013  . Tobacco user 01/23/2013  . Chronic  diastolic heart failure (HCC) 01/21/2013  . 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 Shoulder pain, right 02/15/2011  . Seasonal and perennial allergic rhinitis 02/13/2008  . Dyslipidemia 02/12/2008  . Cocaine abuse (HCC) 02/12/2008  . Obstructive sleep apnea 02/12/2008    [facility] ,MS, CCC-SLP  11/05/2020, 3:56 PM  [facility] [address] [place], [place], [address] Phone: [phone]   Fax:  [phone]   Name: [name]. MRN: [id number] Date of Birth: 1971-01-03

## 2020-11-05 NOTE — Therapy (Signed)
[facility] [address] [place], [place], [address] Phone: [phone]   Fax:  [phone]  Physical Therapy Treatment  Patient Details  Name: [name]. MRN: [id number] Date of Birth: 02/19/1971 Referring Provider (PT): Miguel Aschoff, MD   Encounter Date: 11/05/2020   PT End of Session - 11/05/20 0934    Visit Number 14    Number of Visits 19   per recert 10/21/2020   Authorization Type UHC    PT Start Time 0932    PT Stop Time 1013    PT Time Calculation (min) 41 min    Equipment Utilized During Treatment Gait belt    Activity Tolerance Patient tolerated treatment well;No increased pain    Behavior During Therapy WFL for tasks assessed/performed           Past Medical History:  Diagnosis Date  . Diabetes mellitus   . GERD (gastroesophageal reflux disease)   . History of alcohol abuse   . History of cocaine use   . History of echocardiogram    a. Echo 4/14: Moderate LVH, vigorous LVEF, EF 65-70%, normal wall motion, grade 2 diastolic dysfunction, mildly dilated aortic root and ascending aorta, ascending aorta 40 mm, aortic root 38 mm, mild LAE  . Hx of cardiovascular stress test    a. GXT 5/14: No ischemic changes  //  b. ETT-Myoview 3/16:  Low risk, no ischemia, EF 58%  . Hyperlipidemia   . Hypertension   . Morbid obesity (HCC)   . Paroxysmal atrial fibrillation (HCC)    Occurring in 2008, with several recurrence since then (including in the setting of + cocaine on UDS).  . Sleep apnea    uses CPAP  . SVT (supraventricular tachycardia) (HCC)    mid to long RP SVT 09/2019    Past Surgical History:  Procedure Laterality Date  . APPENDECTOMY    . ATRIAL FIBRILLATION ABLATION N/A 11/27/2019   Procedure: ATRIAL FIBRILLATION ABLATION;  Surgeon: [name], MD;  [facility];  Service: Cardiovascular;  Laterality: N/A;  . ROTATOR CUFF REPAIR    . SVT ABLATION N/A  11/27/2019   Procedure: SVT ABLATION;  Surgeon: [name], MD;  [facility];  Service: Cardiovascular;  Laterality: N/A;  . TONSILLECTOMY      Vitals:   11/05/20 0936 11/05/20 0958 11/05/20 1009  BP: 134/90 126/81 (!) 124/92     Subjective Assessment - 11/05/20 0933    Subjective No new complaints. No falls or pain to report.    Patient Stated Goals Pt's goals for therapy are to get back to where I was with strength and coordination.    Currently in Pain? No/denies    Pain Score 0-No pain                [facility] Adult PT Treatment/Exercise - 11/05/20 0940      Transfers   Transfers Sit to Stand;Stand to Sit    Sit to Stand 6: Modified independent (Device/Increase time);Without upper extremity assist;From chair/3-in-1    Stand to Sit 6: Modified independent (Device/Increase time);Without upper extremity assist;To chair/3-in-1      Ambulation/Gait   Ambulation/Gait Yes    Ambulation/Gait Assistance 6: Modified independent (Device/Increase time)    Assistive device None    Gait Pattern Step-through pattern;Decreased stride length;Poor foot clearance - left;Poor foot clearance - right    Ambulation Surface Level;Indoor      Knee/Hip Exercises: Aerobic  Other Aerobic Scifit level 2.5 with UE/LE's for 8 minutes with goal >/= 65 rpm for activity tolerance and strengthening.               Balance Exercises - 11/05/20 0958      Balance Exercises: Standing   Rockerboard Anterior/posterior;Lateral;Head turns;EO;EC;30 seconds;10 reps;Limitations    Rockerboard Limitations performed both ways on the balance board: rocking the board with emphasis on tall posture with EO, progressing to EC for ~10 reps. then holding the board steady for EC 30 sec's x 3 reps, progressing to EC head movements left<>right, up<>down for ~10 reps each. min guard to min assist for balance with cues on posture/weight shifitng for balance assistance. intermittent touch to bars as needed  for balance as well.               PT Short Term Goals - 10/04/20 0954      PT SHORT TERM GOAL #1   Title Pt will be independent with HEP for improved balance and gait.  TARGET 10/01/2020    Baseline 09/30/20: pt reports doing current HEP with no issues    Status Achieved      PT SHORT TERM GOAL #2   Title Pt to improve by at least 75 ft for improved gait endurance and efficiency.    Baseline 09/30/20: 1197 feet (increased by 200 feet)    Status Achieved      PT SHORT TERM GOAL #3   Title Sensory Organization Test to be assessed, with goal written as appropriate.    Baseline SOT had been deferred by primary PT, but completed 10/04/2020 visit; composite score 29 and all measures decreased compared to normal    Time 4    Period Weeks    Status Achieved      PT SHORT TERM GOAL #4   Title Pt will improve FGA score to at least 22/30 for decreased fall risk.    Baseline 09/30/20: 23/30 scored today    Status Achieved             PT Long Term Goals - 10/21/20 1526      PT LONG TERM GOAL #1   Title Pt will be independent with final progression of HEP for improved gait and balance.  TARGET 11/19/2020    Time 4    Period Weeks    Status Revised      PT LONG TERM GOAL #2   Title Pt will improve FGA to at least 26/30 for decreased fall risk.    Baseline 23/30 at last check    Time 4    Period Weeks    Status Revised      PT LONG TERM GOAL #3   Title Pt will improve 6 MWT to at least 1300 ft for improved gait efficiency for return to work tasks.    Baseline 1197 ft/1190 ft    Time 4    Period Weeks    Status Revised      PT LONG TERM GOAL #4   Title Pt will ambulate at least 1000 ft, indoors and outdoors surfaces, independently, no LOB, for improved community ambulation.    Time 4    Period Weeks    Status On-going      PT LONG TERM GOAL #5   Title Sensory Organization Test composite score to improve by at least 10 points for improved balance.    Baseline 29  on 10/04/2020    Time 4  Period Weeks    Status On-going                 Plan - 11/05/20 0934    Clinical Impression Statement Today's skilled session continued to focus on activity tolerance, strengthening and balance training with out and with vision removed on compliant surfaces. BP remainded withing acceptable ranges with diastolic continuing to run elevated in 80-90's. Pt see's his MD next Tuesday as yesterday's appt was cancelled by the MD offiice and moved to next week. The pt is progressing and should benefit from continued PT to progress toward unmet goals.    Personal Factors and Comorbidities Comorbidity 3+    Comorbidities diabetes, HTN, morbid obesity, history of polysubstance abuse (cocaine and alcohol)    Examination-Activity Limitations Locomotion Level;Transfers;Stairs    Examination-Participation Restrictions Community Activity;Yard Work    Stability/Clinical Decision Making Evolving/Moderate complexity    Rehab Potential Good    PT Frequency 2x / week    PT Duration 4 weeks   per recert 10/21/2020   PT Treatment/Interventions ADLs/Self Care Home Management;Gait training;Stair training;Functional mobility training;Therapeutic activities;Therapeutic exercise;Balance training;Neuromuscular re-education;Patient/family education    PT Next Visit Plan Review HEP as needed; continue to work on balance reactions, compliant surfaces and dynamic balance.  Please check BP measures before treatment    Consulted and Agree with Plan of Care Patient           Patient will benefit from skilled therapeutic intervention in order to improve the following deficits and impairments:  Abnormal gait,Difficulty walking,Decreased balance,Decreased mobility  Visit Diagnosis: Unsteadiness on feet  Other abnormalities of gait and mobility     Problem List Patient Active Problem List   Diagnosis Date Noted  . Amnesia 09/08/2020  . Cerebral embolism with cerebral infarction  06/01/2020  . Acute cerebrovascular accident (CVA) (HCC) 06/01/2020  . Paronychia of right thumb 05/10/2020  . Mediastinal adenopathy 01/06/2020  . Secondary hypercoagulable state (HCC) 10/22/2019  . Acute pancreatitis 01/22/2019  . Morbid obesity due to excess calories (HCC) 12/08/2016  . Paroxysmal atrial fibrillation (HCC) 01/23/2013  . Diabetes mellitus (HCC) 01/23/2013  . Hypertension 01/23/2013  . Tobacco user 01/23/2013  . Chronic diastolic heart failure (HCC) 01/21/2013  . 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 Shoulder pain, right 02/15/2011  . Seasonal and perennial allergic rhinitis 02/13/2008  . Dyslipidemia 02/12/2008  . Cocaine abuse (HCC) 02/12/2008  . Obstructive sleep apnea 02/12/2008    Sallyanne Kuster, PTA, [facility] [address], [address] [place], [place] [address] [phone] 11/05/20, 10:37 AM   Name: [name]. MRN: [id number] Date of Birth: 05/02/1971

## 2020-11-05 NOTE — Therapy (Signed)
[facility] [address] [place], [name], [address] Phone: [phone]   Fax:  [phone]  Occupational Therapy Treatment  Patient Details  Name: [name]. MRN: [id number] Date of Birth: Jun 25, 1971 No data recorded  Encounter Date: 11/05/2020   OT End of Session - 11/05/20 1141    Visit Number 10    Number of Visits 12    Date for OT Re-Evaluation 10/15/20    Authorization Type UHC    OT Start Time 1106    OT Stop Time 1145    OT Time Calculation (min) 39 min    Behavior During Therapy WFL for tasks assessed/performed           Past Medical History:  Diagnosis Date   Diabetes mellitus    GERD (gastroesophageal reflux disease)    History of alcohol abuse    History of cocaine use    History of echocardiogram    a. Echo 4/14: Moderate LVH, vigorous LVEF, EF 65-70%, normal wall motion, grade 2 diastolic dysfunction, mildly dilated aortic root and ascending aorta, ascending aorta 40 mm, aortic root 38 mm, mild LAE   Hx of cardiovascular stress test    a. GXT 5/14: No ischemic changes  //  b. ETT-Myoview 3/16:  Low risk, no ischemia, EF 58%   Hyperlipidemia    Hypertension    Morbid obesity ([place])    Paroxysmal atrial fibrillation ([place])    Occurring in 2008, with several recurrence since then (including in the setting of + cocaine on UDS).   Sleep apnea    uses CPAP   SVT (supraventricular tachycardia) ([place])    mid to long RP SVT 09/2019    Past Surgical History:  Procedure Laterality Date   APPENDECTOMY     ATRIAL FIBRILLATION ABLATION N/A 11/27/2019   Procedure: ATRIAL FIBRILLATION ABLATION;  Surgeon: Thompson Grayer, MD;  Location: [place] [facility];  Service: Cardiovascular;  Laterality: N/A;   ROTATOR CUFF REPAIR     SVT ABLATION N/A 11/27/2019   Procedure: SVT ABLATION;  Surgeon: Thompson Grayer, MD;  Location: [place] [facility] LAB;  Service: Cardiovascular;  Laterality: N/A;    TONSILLECTOMY      There were no vitals filed for this visit.          Treatment:97% on large print number cancellation 21M, pt reports previous difficulties were related to small letter size and that he wears readers which he has not brougth in  Environmental scanning 1/17 missed 94% Alternating attention task on constant therapy 90%, 43.84 secs response time                   OT Long Term Goals - 11/05/20 1119      OT LONG TERM GOAL #1   Title Independent with coordination HEP    Time 6    Period Weeks    Status Achieved      OT LONG TERM GOAL #2   Title Improve coordination Lt hand as evidenced by reducing speed on 9 hole peg test to 35 sec or less    Time 6    Period Weeks    Status Achieved   34.41 secs     OT LONG TERM GOAL #3   Title Pt to perform tabletop scanning at 90% or greater accuracy    Time 6    Period Weeks    Status Achieved   met with larger print 21M, pt reports he generally wears  readers but has not brought them to therapy     OT LONG TERM GOAL #4   Title Pt to perform environmental scanning at 85% accuracy or greater while performing simple physical task and without bumping into objects/furniture/walls    Time 6    Period Weeks    Status Achieved   94%     OT LONG TERM GOAL #5   Title Pt to perform simulated work tasks safely    Time 6    Period Weeks    Status Deferred   Pt is not planning to go back to his job at this time     [place] #6   Title Pt to attend to task for 15 minutes without distraction and remain alert    Time 6    Period Weeks    Status Achieved                 Plan - 11/05/20 1339    Clinical Impression Statement Pt demonstrates overall progress. Pt agrees with plans for d/c.  Pt reports some learning difficulties premorbidly and that he usually wears readers which he has not brought in.    OT Occupational Profile and History Problem Focused Assessment - Including review of records  relating to presenting problem    Occupational performance deficits (Please refer to evaluation for details): IADL's;Work    Body Structure / Function / Physical Skills IADL;Coordination;FMC;Decreased knowledge of precautions;UE functional use    Cognitive Skills Attention;Safety Awareness    Rehab Potential Good    Clinical Decision Making Several treatment options, min-mod task modification necessary    Comorbidities Affecting Occupational Performance: May have comorbidities impacting occupational performance    Modification or Assistance to Complete Evaluation  No modification of tasks or assist necessary to complete eval    OT Frequency 2x / week    OT Duration 6 weeks   or 12 visits over extended weeks (d/t potential scheduling conflicts)   OT Treatment/Interventions Therapeutic activities;Therapeutic exercise;Cognitive remediation/compensation;Neuromuscular education;Functional Mobility Training;Visual/perceptual remediation/compensation;Patient/family education    Plan d/c OT, pt is in agreement    Consulted and Agree with Plan of Care Patient           Patient will benefit from skilled therapeutic intervention in order to improve the following deficits and impairments:   Body Structure / Function / Physical Skills: IADL,Coordination,FMC,Decreased knowledge of precautions,UE functional use Cognitive Skills: Attention,Safety Awareness   OCCUPATIONAL THERAPY DISCHARGE SUMMARY    Current functional level related to goals / functional outcomes: Pt made good overall progress towards goals.   Remaining deficits: Visual deficits, cognition   Education / Equipment: Pt was educated regarding compensatory strategies for visual deficits. Pt demonstrates understanding. Plan: Patient agrees to discharge.  Patient goals were partially met. Patient is being discharged due to the patient's request.  ?????      Visit Diagnosis: Visuospatial deficit  Other lack of  coordination  Other symptoms and signs involving cognitive functions following cerebral infarction    Problem List Patient Active Problem List   Diagnosis Date Noted   Amnesia 09/08/2020   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   Acute cerebrovascular accident (CVA) ([place]) 06/01/2020   Paronychia of right thumb 05/10/2020   Mediastinal adenopathy 01/06/2020   Secondary hypercoagulable state ([place]) 10/22/2019   Acute pancreatitis 01/22/2019   Morbid obesity due to excess calories ([place]) 12/08/2016   Paroxysmal atrial fibrillation ([place]) 01/23/2013   Diabetes mellitus ([place]) 01/23/2013   Hypertension 01/23/2013  Tobacco user 01/23/2013   Chronic diastolic heart failure ([place]) 01/21/2013   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 MRN: [id number] Date of Birth: September 04, 1971

## 2020-11-08 ENCOUNTER — Ambulatory Visit: Payer: 59

## 2020-11-08 ENCOUNTER — Other Ambulatory Visit: Payer: Self-pay

## 2020-11-08 ENCOUNTER — Ambulatory Visit: Payer: 59 | Admitting: Physical Therapy

## 2020-11-08 ENCOUNTER — Ambulatory Visit: Payer: 59 | Admitting: Occupational Therapy

## 2020-11-08 VITALS — BP 151/99 | HR 71

## 2020-11-08 DIAGNOSIS — R2681 Unsteadiness on feet: Secondary | ICD-10-CM

## 2020-11-08 DIAGNOSIS — R2689 Other abnormalities of gait and mobility: Secondary | ICD-10-CM

## 2020-11-08 DIAGNOSIS — R41841 Cognitive communication deficit: Secondary | ICD-10-CM

## 2020-11-08 NOTE — Patient Instructions (Signed)
   Begin taking Adderall again.

## 2020-11-08 NOTE — Therapy (Signed)
[facility] [address] [place], [place], [address] Phone: [phone]   Fax:  [phone]  Speech Language Pathology Treatment  Patient Details  Name: [name]. MRN: [id number] Date of Birth: 12/19/1970 Referring Provider (SLP): Ihor Austin, NP   Encounter Date: 11/08/2020   End of Session - 11/08/20 1553    Visit Number 6    Number of Visits 17    Date for SLP Re-Evaluation 12/31/20    SLP Start Time 1104    SLP Stop Time  1140    SLP Time Calculation (min) 36 min    Activity Tolerance Patient tolerated treatment well           Past Medical History:  Diagnosis Date  . Diabetes mellitus   . GERD (gastroesophageal reflux disease)   . History of alcohol abuse   . History of cocaine use   . History of echocardiogram    a. Echo 4/14: Moderate LVH, vigorous LVEF, EF 65-70%, normal wall motion, grade 2 diastolic dysfunction, mildly dilated aortic root and ascending aorta, ascending aorta 40 mm, aortic root 38 mm, mild LAE  . Hx of cardiovascular stress test    a. GXT 5/14: No ischemic changes  //  b. ETT-Myoview 3/16:  Low risk, no ischemia, EF 58%  . Hyperlipidemia   . Hypertension   . Morbid obesity (HCC)   . Paroxysmal atrial fibrillation (HCC)    Occurring in 2008, with several recurrence since then (including in the setting of + cocaine on UDS).  . Sleep apnea    uses CPAP  . SVT (supraventricular tachycardia) (HCC)    mid to long RP SVT 09/2019    Past Surgical History:  Procedure Laterality Date  . APPENDECTOMY    . ATRIAL FIBRILLATION ABLATION N/A 11/27/2019   Procedure: ATRIAL FIBRILLATION ABLATION;  Surgeon: [name], MD;  Location: [facility];  Service: Cardiovascular;  Laterality: N/A;  . ROTATOR CUFF REPAIR    . SVT ABLATION N/A 11/27/2019   Procedure: SVT ABLATION;  Surgeon: [name], MD;  Location: [facility];  Service: Cardiovascular;  Laterality:  N/A;  . TONSILLECTOMY      There were no vitals filed for this visit.   Subjective Assessment - 11/08/20 1114    Subjective "What we talked about the calendar last time? It's helping me remember more stuff."    Currently in Pain? No/denies                 ADULT SLP TREATMENT - 11/08/20 1123      General Information   Behavior/Cognition Cooperative;Alert;Pleasant mood      Treatment Provided   Treatment provided Cognitive-Linquistic      Cognitive-Linquistic Treatment   Treatment focused on Cognition    Skilled Treatment Pt tells SLP that it's on his calendar to call MD office about refill for Adderall. Pt has not taken it at all due to not thinking about how to time the dosage. SLP assisted pt in finding out that 10AM was good time for dosage for patient - pt put alarm in his phone for 10am "adderall"      Assessment / Recommendations / Plan   Plan Continue with current plan of care      Progression Toward Goals   Progression toward goals Progressing toward goals              SLP Short Term Goals - 11/08/20 1557  SLP SHORT TERM GOAL #1   Title pt will tell SLP three ways to compensate for his memory in 3 sessions, with modifeid independence    Baseline 11-05-20, 11-08-20 (calendar)    Time 1    Period Weeks    Status On-going      SLP SHORT TERM GOAL #2   Title pt will demonstrate selective attention to simple-mod complex task for 8 mintues in quiet environment in 2 sessions    Baseline 10-14-20, 11-05-20    Time 1    Period Weeks    Status On-going      SLP SHORT TERM GOAL #3   Title pt will demo speech compensations for intelligibility 100% in 15 minutes simple/mod compelx conversation in 2 sessions    Status Deferred   denies ongoing dysarthria           SLP Long Term Goals - 11/08/20 1559      SLP LONG TERM GOAL #1   Title pt will demo simple divided attention skills in functinoal cogntive communcation tasks in 3 sessions    Time 5     Period Weeks   or 17 visits, for all LTGs   Status On-going      SLP LONG TERM GOAL #2   Title pt will report improving swallow skills by telling SLP frequency of cough during liquid consumption is once/month    Time 5    Period Weeks    Status On-going      SLP LONG TERM GOAL #3   Title pt will use memory compensations for appointments, medications, exercise/homework tracking, and/or to-do lists, etc in 5 sessions    Baseline 11-05-20 (meds)    Time 5    Period Weeks    Status On-going            Plan - 11/08/20 1554    Clinical Impression Statement Shyler presents today with a cognitive communication deficit and dysphagia having resolved. Pt had not begun taking Adderall so SLP again encouraged pt to do so. SLP again did not hear any dysarthria - pt indicates this has resolved. Pt would cont to benefit from skilled ST addressing cognition-communication in order to return to workforce in some capacity and to better communicate with friends and family. Goals may change if pt's attention improves with pt begins taking his prescribed Adderall. Pt may also require objective swallow exam during this therapy course - although it appears as if dysphagia is resolving spontaneously.    Speech Therapy Frequency 2x / week    Duration 8 weeks   or 17 sessions   Treatment/Interventions Aspiration precaution training;Pharyngeal strengthening exercises;Diet toleration management by SLP;Trials of upgraded texture/liquids;Cueing hierarchy;Cognitive reorganization;Internal/external aids;Patient/family education;Compensatory strategies;SLP instruction and feedback;Functional tasks    Potential to Achieve Goals Good    Potential Considerations Previous level of function;Cooperation/participation level   hx of decr'd follow through of medical recommendations   Consulted and Agree with Plan of Care Patient           Patient will benefit from skilled therapeutic intervention in order to improve the  following deficits and impairments:   Cognitive communication deficit    Problem List Patient Active Problem List   Diagnosis Date Noted  . Amnesia 09/08/2020  . 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 Acute cerebrovascular accident (CVA) (HCC) 06/01/2020  . Paronychia of right thumb 05/10/2020  . Mediastinal adenopathy 01/06/2020  . Secondary hypercoagulable state (HCC) 10/22/2019  . Acute pancreatitis 01/22/2019  . Morbid  obesity due to excess calories (HCC) 12/08/2016  . Paroxysmal atrial fibrillation (HCC) 01/23/2013  . Diabetes mellitus (HCC) 01/23/2013  . Hypertension 01/23/2013  . Tobacco user 01/23/2013  . Chronic diastolic heart failure (HCC) 01/21/2013  . 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 Shoulder pain, right 02/15/2011  . Seasonal and perennial allergic rhinitis 02/13/2008  . Dyslipidemia 02/12/2008  . Cocaine abuse (HCC) 02/12/2008  . Obstructive sleep apnea 02/12/2008    [facility] ,MS, CCC-SLP  11/08/2020, 4:00 PM   [facility] [address] [place], [place], [address] Phone: [phone]   Fax:  [phone]   Name: Marie Chow. MRN: [id number] Date of Birth: Dec 08, 1970

## 2020-11-09 ENCOUNTER — Encounter: Payer: Self-pay | Admitting: Physical Therapy

## 2020-11-09 NOTE — Therapy (Signed)
[facility] [address] [place], [place], [address] Phone: [phone]   Fax:  [phone]  Physical Therapy Treatment  Patient Details  Name: [name]. MRN: [id number] Date of Birth: June 08, 1971 Referring Provider (PT): Miguel Aschoff, MD   Encounter Date: 11/08/2020   PT End of Session - 11/08/20 1100    Visit Number 15    Number of Visits 19   per recert 10/21/2020   Authorization Type [employer]    PT Start Time 1022    PT Stop Time 1100    PT Time Calculation (min) 38 min    Equipment Utilized During Treatment Gait belt    Activity Tolerance Patient tolerated treatment well    Behavior During Therapy WFL for tasks assessed/performed           Past Medical History:  Diagnosis Date  . Diabetes mellitus   . GERD (gastroesophageal reflux disease)   . History of alcohol abuse   . History of cocaine use   . History of echocardiogram    a. Echo 4/14: Moderate LVH, vigorous LVEF, EF 65-70%, normal wall motion, grade 2 diastolic dysfunction, mildly dilated aortic root and ascending aorta, ascending aorta 40 mm, aortic root 38 mm, mild LAE  . Hx of cardiovascular stress test    a. GXT 5/14: No ischemic changes  //  b. ETT-Myoview 3/16:  Low risk, no ischemia, EF 58%  . Hyperlipidemia   . Hypertension   . Morbid obesity (HCC)   . Paroxysmal atrial fibrillation (HCC)    Occurring in 2008, with several recurrence since then (including in the setting of + cocaine on UDS).  . Sleep apnea    uses CPAP  . SVT (supraventricular tachycardia) (HCC)    mid to long RP SVT 09/2019    Past Surgical History:  Procedure Laterality Date  . APPENDECTOMY    . ATRIAL FIBRILLATION ABLATION N/A 11/27/2019   Procedure: ATRIAL FIBRILLATION ABLATION;  Surgeon: [name], MD;  Location: [facility];  Service: Cardiovascular;  Laterality: N/A;  . ROTATOR CUFF REPAIR    . SVT ABLATION N/A 11/27/2019   Procedure:  SVT ABLATION;  Surgeon: [name], MD;  Location: [facility];  Service: Cardiovascular;  Laterality: N/A;  . TONSILLECTOMY      Vitals:   11/08/20 1029 11/08/20 1031 11/08/20 1050  BP: (!) 159/102 (!) 148/93 (!) 151/99  Pulse: 70 68 71     Subjective Assessment - 11/08/20 1025    Subjective No changes, no falls.    Patient Stated Goals Pt's goals for therapy are to get back to where I was with strength and coordination.    Currently in Pain? No/denies                             [facility] Adult PT Treatment/Exercise - 11/08/20 1025      Ambulation/Gait   Ambulation/Gait Yes    Ambulation/Gait Assistance 6: Modified independent (Device/Increase time)    Ambulation Distance (Feet) 575 Feet   approx 3 minutes of walking   Assistive device None    Gait Pattern Step-through pattern;Decreased stride length    Ambulation Surface Level;Indoor      Knee/Hip Exercises: Standing   Forward Step Up 10 reps;Hand Hold: 2;Step Height: 6";2 sets    Forward Step Up Limitations Step up/up, down/down x 10 reps; Single limb step ups x 10 reps  Functional Squat 1 set;10 reps    Functional Squat Limitations 2nd set of 10, squat>up on toes.  Pt c/o fatigue at end            Neuro Re-education: Access Code: [email or web address] URL: [email or web address] Date: 09/14/2020 Prepared by: Lonia Blood   Exercises-Reviewed pt's balance HEP:   Standing Balance in Corner - 1 x daily - 5 x weekly - 1 sets - 5-10 reps (on compliant surface)             -feet apart EO head turns/head nods x 10, then EC head turns/head nods x 10             -marching in place x 10             -heel/toe raises x 10 Romberg Stance on Foam Pad - 1 x daily - 5 x weekly - 1-2 sets - 10 reps             -feet together EO head turns/nods x 10; then EC head turns/head nods x 10   Cues for UE support as needed for stability.    Additional balance exercises: Standing on incline/decline of ramp on  blue mat surface:  Marching in place x 10, alt forward step taps x 10 reps, stagger stance position each foot position with EC head turns/head nods.  Min guard assist provided throughout, with pt having occasional UE support at mirror/wall to steady himself.      PT Education - 11/09/20 0731    Education Details Importance of continuing HEP-pt reports not performing consistently corner balance exercises and c/o leg fatigue with exercises in session today.  Discussed importance of consistency of exercises and walking through the day.  Recommended pt walking in his home at least 3 minutes, 3x/day for walking exercise and resume consistent performance of HEP.    Person(s) Educated Patient    Methods Explanation    Comprehension Verbalized understanding            PT Short Term Goals - 10/04/20 0954      PT SHORT TERM GOAL #1   Title Pt will be independent with HEP for improved balance and gait.  TARGET 10/01/2020    Baseline 09/30/20: pt reports doing current HEP with no issues    Status Achieved      PT SHORT TERM GOAL #2   Title Pt to improve by at least 75 ft for improved gait endurance and efficiency.    Baseline 09/30/20: 1197 feet (increased by 200 feet)    Status Achieved      PT SHORT TERM GOAL #3   Title Sensory Organization Test to be assessed, with goal written as appropriate.    Baseline SOT had been deferred by primary PT, but completed 10/04/2020 visit; composite score 29 and all measures decreased compared to normal    Time 4    Period Weeks    Status Achieved      PT SHORT TERM GOAL #4   Title Pt will improve FGA score to at least 22/30 for decreased fall risk.    Baseline 09/30/20: 23/30 scored today    Status Achieved             PT Long Term Goals - 10/21/20 1526      PT LONG TERM GOAL #1   Title Pt will be independent with final progression of HEP for improved gait and balance.  TARGET 11/19/2020    Time 4  Period Weeks    Status Revised       PT LONG TERM GOAL #2   Title Pt will improve FGA to at least 26/30 for decreased fall risk.    Baseline 23/30 at last check    Time 4    Period Weeks    Status Revised      PT LONG TERM GOAL #3   Title Pt will improve 6 MWT to at least 1300 ft for improved gait efficiency for return to work tasks.    Baseline 1197 ft/1190 ft    Time 4    Period Weeks    Status Revised      PT LONG TERM GOAL #4   Title Pt will ambulate at least 1000 ft, indoors and outdoors surfaces, independently, no LOB, for improved community ambulation.    Time 4    Period Weeks    Status On-going      PT LONG TERM GOAL #5   Title Sensory Organization Test composite score to improve by at least 10 points for improved balance.    Baseline 29 on 10/04/2020    Time 4    Period Weeks    Status On-going                 Plan - 11/09/20 0734    Clinical Impression Statement Reviewed pt's balance HEP (corner balance exercises), and pt reports not performing at home.  Pt continues to be challenged by compliant surfaces with vision removed and head motions.  PT recommended to pt that he resume consistent performance of HEP, as this is how is balance will improve.  Had frank discussion about overall increased activity and walking for exercise daily, as pt reports fatigue and muscle tiredness with exercises in session today.  This is wk 3 of 4 in POC; plan to assess goals next week and decide renew vs. d/c (more appts were made today just in case we conitnue beyond next week, but can be removed if needed.)    Personal Factors and Comorbidities Comorbidity 3+    Comorbidities diabetes, HTN, morbid obesity, history of polysubstance abuse (cocaine and alcohol)    Examination-Activity Limitations Locomotion Level;Transfers;Stairs    Examination-Participation Restrictions Community Activity;Yard Work    Stability/Clinical Decision Making Evolving/Moderate complexity    Rehab Potential Good    PT Frequency 2x / week     PT Duration 4 weeks   per recert 10/21/2020   PT Treatment/Interventions ADLs/Self Care Home Management;Gait training;Stair training;Functional mobility training;Therapeutic activities;Therapeutic exercise;Balance training;Neuromuscular re-education;Patient/family education    PT Next Visit Plan Review HEP as needed and walking for exercise recommendations-is he doing them?; continue to work on balance reactions, compliant surfaces and dynamic balance.  Please check BP measures before treatment.  Need to check LTGs wk of 11/15/20 and decide renew or d/c-    Consulted and Agree with Plan of Care Patient           Patient will benefit from skilled therapeutic intervention in order to improve the following deficits and impairments:  Abnormal gait,Difficulty walking,Decreased balance,Decreased mobility  Visit Diagnosis: Unsteadiness on feet  Other abnormalities of gait and mobility     Problem List Patient Active Problem List   Diagnosis Date Noted  . Amnesia 09/08/2020  . 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 Acute cerebrovascular accident (CVA) (HCC) 06/01/2020  . Paronychia of right thumb 05/10/2020  . Mediastinal adenopathy 01/06/2020  . Secondary hypercoagulable state (HCC) 10/22/2019  . Acute pancreatitis  01/22/2019  . Morbid obesity due to excess calories (HCC) 12/08/2016  . Paroxysmal atrial fibrillation (HCC) 01/23/2013  . Diabetes mellitus (HCC) 01/23/2013  . Hypertension 01/23/2013  . Tobacco user 01/23/2013  . Chronic diastolic heart failure (HCC) 01/21/2013  . 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 Shoulder pain, right 02/15/2011  . Seasonal and perennial allergic rhinitis 02/13/2008  . Dyslipidemia 02/12/2008  . Cocaine abuse (HCC) 02/12/2008  . Obstructive sleep apnea 02/12/2008    Josephine Wooldridge W. 11/09/2020, 7:38 AM  Gean MaidensMARRIOTT,Naftoli Penny W., PT   [facility] [address] [place],  [place], [id number] Phone: [phone]   Fax:  [phone]  Name: Raymondo BandLeonidas Claude Merced Jr. MRN: [id number] Date of Birth: 1971-02-23

## 2020-11-10 ENCOUNTER — Ambulatory Visit: Payer: 59 | Admitting: Physical Therapy

## 2020-11-10 ENCOUNTER — Ambulatory Visit: Payer: 59 | Admitting: Occupational Therapy

## 2020-11-10 ENCOUNTER — Ambulatory Visit: Payer: 59

## 2020-11-10 ENCOUNTER — Encounter: Payer: 59 | Admitting: Student

## 2020-11-15 ENCOUNTER — Encounter (INDEPENDENT_AMBULATORY_CARE_PROVIDER_SITE_OTHER): Payer: 59 | Admitting: Ophthalmology

## 2020-11-16 ENCOUNTER — Other Ambulatory Visit: Payer: Self-pay

## 2020-11-16 ENCOUNTER — Encounter: Payer: Self-pay | Admitting: Physical Therapy

## 2020-11-16 ENCOUNTER — Ambulatory Visit: Payer: 59 | Attending: Internal Medicine | Admitting: Physical Therapy

## 2020-11-16 VITALS — BP 132/85

## 2020-11-16 DIAGNOSIS — I69318 Other symptoms and signs involving cognitive functions following cerebral infarction: Secondary | ICD-10-CM | POA: Insufficient documentation

## 2020-11-16 DIAGNOSIS — I69351 Hemiplegia and hemiparesis following cerebral infarction affecting right dominant side: Secondary | ICD-10-CM | POA: Diagnosis present

## 2020-11-16 DIAGNOSIS — R278 Other lack of coordination: Secondary | ICD-10-CM | POA: Insufficient documentation

## 2020-11-16 DIAGNOSIS — R471 Dysarthria and anarthria: Secondary | ICD-10-CM | POA: Insufficient documentation

## 2020-11-16 DIAGNOSIS — M6281 Muscle weakness (generalized): Secondary | ICD-10-CM | POA: Insufficient documentation

## 2020-11-16 DIAGNOSIS — R2689 Other abnormalities of gait and mobility: Secondary | ICD-10-CM | POA: Diagnosis present

## 2020-11-16 DIAGNOSIS — R41842 Visuospatial deficit: Secondary | ICD-10-CM | POA: Diagnosis present

## 2020-11-16 DIAGNOSIS — R41841 Cognitive communication deficit: Secondary | ICD-10-CM | POA: Insufficient documentation

## 2020-11-16 DIAGNOSIS — R1310 Dysphagia, unspecified: Secondary | ICD-10-CM | POA: Diagnosis present

## 2020-11-16 DIAGNOSIS — R2681 Unsteadiness on feet: Secondary | ICD-10-CM | POA: Diagnosis present

## 2020-11-16 NOTE — Therapy (Signed)
[facility] [address] [place], [place], [address] Phone: [phone]   Fax:  [phone]  Physical Therapy Treatment  Patient Details  Name: [name]. MRN: [id number] Date of Birth: 1971/04/25 Referring Provider (PT): Miguel Aschoff, MD   Encounter Date: 11/16/2020   PT End of Session - 11/16/20 0854    Visit Number 16    Number of Visits 19   per recert 10/21/2020   Authorization Type UHC    PT Start Time 0846    PT Stop Time 0929    PT Time Calculation (min) 43 min    Equipment Utilized During Treatment Gait belt    Activity Tolerance Patient tolerated treatment well    Behavior During Therapy [facility] for tasks assessed/performed           Past Medical History:  Diagnosis Date  . Diabetes mellitus   . GERD (gastroesophageal reflux disease)   . History of alcohol abuse   . History of cocaine use   . History of echocardiogram    a. Echo 4/14: Moderate LVH, vigorous LVEF, EF 65-70%, normal wall motion, grade 2 diastolic dysfunction, mildly dilated aortic root and ascending aorta, ascending aorta 40 mm, aortic root 38 mm, mild LAE  . Hx of cardiovascular stress test    a. GXT 5/14: No ischemic changes  //  b. ETT-Myoview 3/16:  Low risk, no ischemia, EF 58%  . Hyperlipidemia   . Hypertension   . Morbid obesity (HCC)   . Paroxysmal atrial fibrillation (HCC)    Occurring in 2008, with several recurrence since then (including in the setting of + cocaine on UDS).  . Sleep apnea    uses CPAP  . SVT (supraventricular tachycardia) (HCC)    mid to long RP SVT 09/2019    Past Surgical History:  Procedure Laterality Date  . APPENDECTOMY    . ATRIAL FIBRILLATION ABLATION N/A 11/27/2019   Procedure: ATRIAL FIBRILLATION ABLATION;  Surgeon: [name], MD;  [facility]: [facility] INV[facility] LAB;  Service: Cardiovascular;  Laterality: N/A;  . ROTATOR CUFF REPAIR    . SVT ABLATION N/A 11/27/2019   Procedure:  SVT ABLATION;  Surgeon: [name], MD;  Location: [facility] INVASIVE CV LAB;  Service: Cardiovascular;  Laterality: N/A;  . TONSILLECTOMY      Vitals:   11/16/20 0851 11/16/20 0928  BP: (!) 144/92 132/85     Subjective Assessment - 11/16/20 0851    Subjective Has had two falls since last session. One was with getting out of the shower. Lost balance forward. Second fall was when he was standing trying to get his undergarments on. Denies any injuries. Was able to get himself back up. Has not been doing the HEP.    Patient Stated Goals Pt's goals for therapy are to get back to where I was with strength and coordination.    Currently in Pain? No/denies    Pain Score 0-No pain              [facility] PT Assessment - 11/16/20 0859      6 Minute Walk- Baseline   6 Minute Walk- Baseline yes    BP (mmHg) 144/92    HR (bpm) 74    02 Sat (%RA) 97 %    Modified Borg Scale for Dyspnea 0- Nothing at all    Perceived Rate of Exertion (Borg) 6-      6 Minute walk- Post Test   6 Minute Walk Post  Test yes    BP (mmHg) 163/97    HR (bpm) 76    02 Sat (%RA) 99 %    Modified Borg Scale for Dyspnea 0.5- Very, very slight shortness of breath    Perceived Rate of Exertion (Borg) 9- very light      6 minute walk test results    Aerobic Endurance Distance Walked 990    Endurance additional comments no AD, no rest breaks                [facility] Adult PT Treatment/Exercise - 11/16/20 0859      Transfers   Transfers Sit to Stand;Stand to Sit    Sit to Stand 6: Modified independent (Device/Increase time);Without upper extremity assist;From chair/3-in-1    Stand to Sit 6: Modified independent (Device/Increase time);Without upper extremity assist;To chair/3-in-1      Ambulation/Gait   Ambulation/Gait Yes    Ambulation/Gait Assistance 6: Modified independent (Device/Increase time)    Ambulation Distance (Feet) 1000 Feet   x1, plus around gym with session   Assistive device None    Gait Pattern  Step-through pattern;Decreased stride length    Ambulation Surface Level;Indoor;Unlevel;Outdoor;Paved      Self-Care   Self-Care Other Self-Care Comments    Other Self-Care Comments  Reviewed HEP. Pt has a complete program, is not compliant with it at this time. Discussed that consistency with his HEP will help his strength/balance and work towards fall prevention. Also discussed fall prevention due to recent falls. Refer to Clinical impression statement for full details.                PT Short Term Goals - 10/04/20 0954      PT SHORT TERM GOAL #1   Title Pt will be independent with HEP for improved balance and gait.  TARGET 10/01/2020    Baseline 09/30/20: pt reports doing current HEP with no issues    Status Achieved      PT SHORT TERM GOAL #2   Title Pt to improve 6MWT by at least 75 ft for improved gait endurance and efficiency.    Baseline 09/30/20: 1197 feet (increased by 200 feet)    Status Achieved      PT SHORT TERM GOAL #3   Title Sensory Organization Test to be assessed, with goal written as appropriate.    Baseline SOT had been deferred by primary PT, but completed 10/04/2020 visit; composite score 29 and all measures decreased compared to normal    Time 4    Period Weeks    Status Achieved      PT SHORT TERM GOAL #4   Title Pt will improve FGA score to at least 22/30 for decreased fall risk.    Baseline 09/30/20: 23/30 scored today    Status Achieved             PT Long Term Goals - 11/16/20 0857      PT LONG TERM GOAL #1   Title Pt will be independent with final progression of HEP for improved gait and balance.  TARGET 11/19/2020    Baseline 11/16/20: pt has an HEP, noncomplaint with program at this time    Time --    Period --    Status Not Met      PT LONG TERM GOAL #2   Title Pt will improve FGA to at least 26/30 for decreased fall risk.    Baseline 23/30 at last check    Time 4  Period Weeks    Status On-going      PT LONG TERM GOAL #3    Title Pt will improve 6 MWT to at least 1300 ft for improved gait efficiency for return to work tasks.    Baseline 11/16/20: 990 feet no AD, decreased from 1197 feet at last assessment    Time --    Period --    Status Not Met      PT LONG TERM GOAL #4   Title Pt will ambulate at least 1000 ft, indoors and outdoors surfaces, independently, no LOB, for improved community ambulation.    Baseline 11/16/20: met in session today    Time --    Period --    Status Achieved      PT LONG TERM GOAL #5   Title Sensory Organization Test composite score to improve by at least 10 points for improved balance.    Baseline 29 on 10/04/2020    Time 4    Period Weeks    Status On-going                 Plan - 11/16/20 0854    Clinical Impression Statement Today's skilled session focused initially on fall prevention due to recent falls. Discussed that consistency with balance HEP will assist with strengthening his balance reactions. Also to sit down to don undergarments/pants as his single leg stance balance without UE support is poor. Pt verbalized understanding. [place] of session focused on progress toward goals.    Personal Factors and Comorbidities Comorbidity 3+    Comorbidities diabetes, HTN, morbid obesity, history of polysubstance abuse (cocaine and alcohol)    Examination-Activity Limitations Locomotion Level;Transfers;Stairs    Examination-Participation Restrictions Community Activity;Yard Work    Stability/Clinical Decision Making Evolving/Moderate complexity    Rehab Potential Good    PT Frequency 2x / week    PT Duration 4 weeks   per recert 01/19/2702   PT Treatment/Interventions ADLs/Self Care Home Management;Gait training;Stair training;Functional mobility training;Therapeutic activities;Therapeutic exercise;Balance training;Neuromuscular re-education;Patient/family education    PT Next Visit Plan check remaining LTGs. discuss with primary PT recert vs discharge    Consulted and  Agree with Plan of Care Patient           Patient will benefit from skilled therapeutic intervention in order to improve the following deficits and impairments:  Abnormal gait,Difficulty walking,Decreased balance,Decreased mobility  Visit Diagnosis: Unsteadiness on feet  Other abnormalities of gait and mobility     Problem List Patient Active Problem List   Diagnosis Date Noted  . Amnesia 09/08/2020  . 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 Acute cerebrovascular accident (CVA) ([place]) 06/01/2020  . Paronychia of right thumb 05/10/2020  . Mediastinal adenopathy 01/06/2020  . Secondary hypercoagulable state ([place]) 10/22/2019  . Acute pancreatitis 01/22/2019  . Morbid obesity due to excess calories ([place]) 12/08/2016  . Paroxysmal atrial fibrillation ([place]) 01/23/2013  . Diabetes mellitus ([place]) 01/23/2013  . Hypertension 01/23/2013  . Tobacco user 01/23/2013  . Chronic diastolic heart failure ([place]) 01/21/2013  . 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 Shoulder pain, right 02/15/2011  . Seasonal and perennial allergic rhinitis 02/13/2008  . Dyslipidemia 02/12/2008  . Cocaine abuse ([place]) 02/12/2008  . Obstructive sleep apnea 02/12/2008    Willow Ora, PTA, [facility] [address], [place], [place] [address] [phone] 11/16/20, 10:50 AM   Name: [name]. MRN: [id number] Date of Birth: 09/11/1971

## 2020-11-18 ENCOUNTER — Ambulatory Visit (INDEPENDENT_AMBULATORY_CARE_PROVIDER_SITE_OTHER): Payer: 59 | Admitting: Student

## 2020-11-18 ENCOUNTER — Other Ambulatory Visit: Payer: Self-pay

## 2020-11-18 ENCOUNTER — Ambulatory Visit: Payer: 59 | Admitting: Adult Health

## 2020-11-18 VITALS — BP 176/96 | HR 68 | Temp 98.2°F

## 2020-11-18 DIAGNOSIS — R531 Weakness: Secondary | ICD-10-CM

## 2020-11-18 DIAGNOSIS — I10 Essential (primary) hypertension: Secondary | ICD-10-CM | POA: Diagnosis not present

## 2020-11-18 DIAGNOSIS — I639 Cerebral infarction, unspecified: Secondary | ICD-10-CM

## 2020-11-18 MED ORDER — AMLODIPINE BESYLATE 5 MG PO TABS: 5.0000 mg | ORAL_TABLET | Freq: Every day | ORAL | 11 refills | Status: DC

## 2020-11-18 NOTE — Patient Instructions (Signed)
Thank you, [name] for allowing [place] to provide your care today. Today we discussed  Right sided weakness We will be ordering a scan of your head for tomorrow. Please do not miss this appointment. After I get the results, I will call you with the plan in terms of resuming your medications. Please hold your xarelto.   Blood pressure Please continue taking your current blood pressure medicine, we will be adding amlodipine to your regimen to take.   I have ordered the following labs for you:   Lab Orders     BMP8+Anion Gap    Referrals ordered today:   Referral Orders  No referral(s) requested today     I have ordered the following medication/changed the following medications:   Stop the following medications: There are no discontinued medications.   Start the following medications: No orders of the defined types were placed in this encounter.    Follow up: 1 week    Remember: If anything changes or if you have worsening weakness, call 911.   Should you have any questions or concerns please call the internal medicine clinic at [phone].     Thalia Bloodgood, D.O. [facility]

## 2020-11-18 NOTE — Progress Notes (Deleted)
[facility] [address] [place]. [place] [address] [phone]       STROKE FOLLOW UP NOTE  [name]. Date of Birth:  July 20, 1971 Medical Record Number:  [id number]   Reason for Referral: stroke follow up    SUBJECTIVE:   CHIEF COMPLAINT:  No chief complaint on file.   HPI:   Today, 11/18/2020, [name] returns for stroke follow-up.  Stable from stroke standpoint without new or worsening stroke/TIA symptoms and reports residual imbalance, cognitive communication deficit and visual impairment.  He continues to work with PT and SLP but has since completed OT.  Reports compliance with Xarelto and atorvastatin and denies related side effects.  Blood pressure today ***.  Glucose levels ***.  He continues to follow with pulmonology Dr. Maple Hudson for history of narcolepsy with cataplexy and sleep apnea but plans on pursuing CPAP/BiPAP titration due to difficulty tolerating CPAP.      History provided for reference purposes only Initial visit 08/09/2020 JM: Mr. Calcaterra is being seen for hospital follow-up unaccompanied.  Reports residual mild to moderate speech difficulty, short-term memory impairment, imbalance and visual impairment. Denies residual right sided weakness.  He has not worked with any therapy but PCP placed referral to initiate physical and speech therapy but he has not scheduled visits at this time.  Reports worsening vision post stroke with underlying history of diabetic retinopathy.  Denies new or worsening stroke/TIA symptoms.  He reports ongoing compliance with Xarelto and denies bleeding or bruising.  Reports compliance with atorvastatin and denies myalgias.  Blood pressure today satisfactory at 127/77.  He does not routinely monitor at home.  Glucose level stable per patient.  He does report excessive daytime fatigue but has been noncompliant with CPAP over the past couple months due to increased anxiety and "fear of choking".  He has not  further discuss this with his pulmonologist.  Prescribed Adderall for history of narcolepsy but he has not taken since July as he has not been working although per epic reviewed, pulmonologist recently refilled Adderall prescription.  No further concerns at this time.   Stroke admission 06/01/2020 [name] is a 50 y.o. male with history of atrial fibrillation, OSA on CPAP, narcolepsy, diabetes, hypertension, morbid obesity, GERD, history of polysubstance abuse including cocaine and alcohol  who presented on 06/01/2020 with persistentdysarthric speech.  Stroke work-up revealed small L caudate body infarct embolic secondary to known AF source not consistently taking Xarelto.  Recommended continuation of Xarelto consistently for secondary stroke prevention.  HTN stable.  LDL 103 and increase atorvastatin to 40 mg daily.  Uncontrolled DM with A1c 9.9.  Other stroke risk factors include tobacco use, hx ETOH and substance abuse, obesity, family history of stroke and OSA on CPAP.  No prior personal history of stroke.  Evaluated by therapy without PT/OT needs but recommended outpatient SLP for residual cognitive impairment and mild to moderate dysarthria  Stroke:   Small L caudate body infarct embolic secondary to known AF source not consistently taking Xarelto  CT head focal hyperdensities B corona radiata ? Acute infarct, prominent Small vessel disease and Atrophy.   MRI  Small L caudate body infarct. Small vessel disease. Atrophy.   MR CS Unremarkable   MRA head Unremarkable   MRA neck Unremarkable   2D Echo EF 60-65%. No source of embolus. LA mildly dilated  LDL 103  HgbA1c 9.9  VTE prophylaxis - SCDs    Xarelto (rivaroxaban) daily prior to admission, now  on aspirin 325 mg daily. Not taking xarelto as prescribed - resume xarelto and stop aspirin   Therapy recommendations:  HH SLP  Disposition:  home      ROS:   14 system review of systems performed and negative with  exception of those listed in HPI  PMH:  Past Medical History:  Diagnosis Date  . Diabetes mellitus   . GERD (gastroesophageal reflux disease)   . History of alcohol abuse   . History of cocaine use   . History of echocardiogram    a. Echo 4/14: Moderate LVH, vigorous LVEF, EF 65-70%, normal wall motion, grade 2 diastolic dysfunction, mildly dilated aortic root and ascending aorta, ascending aorta 40 mm, aortic root 38 mm, mild LAE  . Hx of cardiovascular stress test    a. GXT 5/14: No ischemic changes  //  b. ETT-Myoview 3/16:  Low risk, no ischemia, EF 58%  . Hyperlipidemia   . Hypertension   . Morbid obesity (HCC)   . Paroxysmal atrial fibrillation (HCC)    Occurring in 2008, with several recurrence since then (including in the setting of + cocaine on UDS).  . Sleep apnea    uses CPAP  . SVT (supraventricular tachycardia) (HCC)    mid to long RP SVT 09/2019    PSH:  Past Surgical History:  Procedure Laterality Date  . APPENDECTOMY    . ATRIAL FIBRILLATION ABLATION N/A 11/27/2019   Procedure: ATRIAL FIBRILLATION ABLATION;  Surgeon: [name], MD;  [facility] IN[facility] CV LAB;  Service: Cardiovascular;  Laterality: N/A;  . ROTATOR CUFF REPAIR    . SVT ABLATION N/A 11/27/2019   Procedure: SVT ABLATION;  Surgeon: [name], MD;  Location: [facility] CV LAB;  Service: Cardiovascular;  Laterality: N/A;  . TONSILLECTOMY      Social History:  Social History   Socioeconomic History  . Marital status: Single    Spouse name: Not on file  . Number of children: 0  . Years of education: 107  . Highest education level: Not on file  Occupational History  . Occupation: Designer, industrial/product: UNEMPLOYED  Tobacco Use  . Smoking status: Current Every Walsh Smoker    Packs/Walsh: 0.50    Years: 28.00    Pack years: 14.00    Types: Cigarettes  . Smokeless tobacco: Former Neurosurgeon    Types: Snuff  Vaping Use  . Vaping Use: Never used  Substance and Sexual Activity  . Alcohol  use: Not Currently    Alcohol/week: 6.0 standard drinks    Types: 6 Standard drinks or equivalent per week    Comment: monthly  . Drug use: Not Currently    Comment: cocaine in the past, none currently  . Sexual activity: Not on file  Other Topics Concern  . Not on file  Social History Narrative   Fun: Photographer       Lives in [place] with sister.      Unemployed, was working a Office manager job   Social Determinants of Community education officer: Not on file  Food Insecurity: Not on file  Transportation Needs: Not on file  Physical Activity: Not on file  Stress: Not on file  Social Connections: Not on file  Intimate Partner Violence: Not on file    Family History:  Family History  Problem Relation Age of Onset  . Diabetes Mother   . Heart attack Father 3  . Stroke Maternal Grandmother   . Stroke Paternal Grandmother   .  Diabetes Paternal Grandfather     Medications:   Current Outpatient Medications on File Prior to Visit  Medication Sig Dispense Refill  . amphetamine-dextroamphetamine (ADDERALL XR) 30 MG 24 hr capsule Take 1 capsule (30 mg total) by mouth daily as needed (focus). 1 daily as needed 30 capsule 0  . atorvastatin (LIPITOR) 40 MG tablet Take 1 tablet (40 mg total) by mouth daily. 30 tablet 1  . Blood Pressure Monitoring (BLOOD PRESSURE MONITOR/L CUFF) MISC 1 Units by Does not apply route daily. 1 each 0  . diltiazem (CARDIZEM) 30 MG tablet Take 1 Tablet Every 4 Hours As Needed For HR >100 (Patient taking differently: Take 30 mg by mouth every 4 (four) hours as needed. For HR >100) 45 tablet 2  . Dulaglutide (TRULICITY) 0.75 MG/0.5ML SOPN Inject 0.75 mg into the skin once a week. 2 mL 5  . flecainide (TAMBOCOR) 100 MG tablet Take 1 tablet (100 mg total) by mouth 2 (two) times daily. 180 tablet 3  . glipiZIDE (GLUCOTROL) 10 MG tablet Take 10 mg by mouth 2 (two) times daily.    Marland Kitchen lisinopril (ZESTRIL) 40 MG tablet Take 40 mg by mouth daily.    .  metFORMIN (GLUCOPHAGE) 500 MG tablet Take 1,000 mg by mouth 2 (two) times daily with a meal.     . metoprolol succinate (TOPROL XL) 100 MG 24 hr tablet Take 1 tablet (100 mg total) by mouth daily. Take with or immediately following a meal. 30 tablet 11  . ondansetron (ZOFRAN ODT) 8 MG disintegrating tablet Take 1 tablet (8 mg total) by mouth every 8 (eight) hours as needed for nausea or vomiting. 12 tablet 0  . pantoprazole (PROTONIX) 40 MG tablet Take 1 tablet (40 mg total) by mouth daily. 30 tablet 5  . spironolactone (ALDACTONE) 25 MG tablet Take 25 mg by mouth daily.    Carlena Hurl 20 MG TABS tablet TAKE 1 TABLET (20 MG TOTAL) BY MOUTH DAILY WITH SUPPER. 30 tablet 6   No current facility-administered medications on file prior to visit.    Allergies:   Allergies  Allergen Reactions  . Shrimp [Shellfish Allergy] Anaphylaxis  . Banana Other (See Comments)    Pt gags  . Watermelon Flavor Nausea And Vomiting  . Peanut-Containing Drug Products Itching and Cough  . Almond Oil Itching    Roof of mouth itches  . Sulfa Antibiotics Itching      OBJECTIVE:  Physical Exam  There were no vitals filed for this visit. There is no height or weight on file to calculate BMI. No exam data present  General: Morbidly obese pleasant middle-aged African-American male, seated, in no evident distress Head: head normocephalic and atraumatic.   Neck: supple with no carotid or supraclavicular bruits Cardiovascular: regular rate and rhythm, no murmurs Musculoskeletal: no deformity Skin:  no rash/petichiae Vascular:  Normal pulses all extremities   Neurologic Exam Mental Status: Awake and fully alert.   Mild dysarthria with mild baseline stuttering.  Oriented to place and time. Recent memory impaired and remote memory intact. Attention span, concentration and fund of knowledge appropriate during visit. Mood and affect flat.  Cranial Nerves: Pupils equal, briskly reactive to light. Extraocular movements  full without nystagmus. Visual fields full to confrontation. Hearing intact. Facial sensation intact. Face, tongue, palate moves normally and symmetrically.  Motor: Normal bulk and tone. Normal strength in all tested extremity muscles. Sensory.: intact to touch , pinprick , position and vibratory sensation.  Coordination: Rapid alternating movements normal  in all extremities. Finger-to-nose and heel-to-shin performed accurately bilaterally. Gait and Station: Arises from chair without difficulty. Stance is normal. Gait demonstrates normal stride length and mild unsteadiness without use of assistive device.  Unable to perform tandem walk.  Romberg testing not attempted. Reflexes: 1+ and symmetric. Toes downgoing.         ASSESSMENT: [name]. is a 50 y.o. year old male presented with persistent dysarthric speech on 06/01/2020 with stroke work-up revealing small L caudate body infarct embolic secondary to known AF source not consistently taking Xarelto. Vascular risk factors include A. Fib, HTN, HLD, DM, OSA on CPAP, morbid obesity, current tobacco use and history of polysubstance and EtOH use.      PLAN:  1. L caudate head infarct :  a. Residual deficit: Gait impairment with imbalance, dysarthria, subjective visual difficulty (?  Visual spatial impairment) and cognitive impairment.  i. Referral recently placed to neuro rehab PT and SLP by PCP. Will also place referral for OT due to possible visual spatial impairment.  Personally assisted patient to neuro rehab office to ensure evaluations are scheduled. ii. He has not returned back to work at this time previously working at [employer] -advised to hold off on returning until further evaluation by therapies iii. Advised to follow-up with his ophthalmologist due to worsening visual concerns post stroke b. Continue Xarelto (rivaroxaban) daily  and atorvastatin 40 mg daily for secondary stroke prevention c. Discussed secondary stroke  prevention measures and importance of close PCP follow up for aggressive stroke risk factor management  2. Atrial fibrillation:  a. CHA2DS2-VASc score 4 indicating use of AC for stroke prevention.   b. Discussed importance of daily use of Xarelto for secondary stroke prevention.   c. Continue to follow with cardiology for monitoring and management. 3. HTN: BP goal <130/90.  Stable today.  On lisinopril and Cardizem per cardiology 4. HLD: LDL goal <70. Recent LDL 103.  Continue atorvastatin.  F/u with PCP for management as well as prescribing of statin 5. DMII: A1c goal<7.0. Recent A1c 9.9. On Trulicity, glipizide and Metformin per PCP 6. OSA: Discussed importance of CPAP compliance for secondary stroke prevention and cardiovascular risk factors as well as noncompliance likely greatly contributing to increased daytime fatigue.  He reports increased anxiety with mask use and advised to further discuss with pulmonologist 7. Narcolepsy: Currently prescribed Adderall by pulmonology with recent refill but patient denies taking since August.  Unsure of accuracy but will notify pulmonology    Follow up in 3 months or call earlier if needed  CC:  [facility] provider: Dr. Rogers Blocker, MD     I spent 50 minutes of face-to-face and non-face-to-face time with patient.  This included previsit chart review, lab review, study review, order entry, electronic health record documentation, patient education regarding recent stroke and etiology, residual deficits, importance of managing stroke risk factors and answered all questions to patient satisfaction   Ihor Austin, [facility]  [facility] [address] [place], [place] [address]  Phone [phone] Fax [phone] Note: This document was prepared with digital dictation and possible smart phrase technology. Any transcriptional errors that result from this process are unintentional.

## 2020-11-19 ENCOUNTER — Observation Stay (HOSPITAL_COMMUNITY): Payer: 59

## 2020-11-19 ENCOUNTER — Other Ambulatory Visit: Payer: Self-pay | Admitting: Student

## 2020-11-19 ENCOUNTER — Ambulatory Visit: Payer: 59 | Admitting: Physical Therapy

## 2020-11-19 ENCOUNTER — Observation Stay (HOSPITAL_BASED_OUTPATIENT_CLINIC_OR_DEPARTMENT_OTHER): Payer: 59

## 2020-11-19 ENCOUNTER — Encounter (HOSPITAL_COMMUNITY): Payer: Self-pay | Admitting: Student in an Organized Health Care Education/Training Program

## 2020-11-19 ENCOUNTER — Ambulatory Visit (INDEPENDENT_AMBULATORY_CARE_PROVIDER_SITE_OTHER): Payer: 59 | Admitting: Student

## 2020-11-19 ENCOUNTER — Ambulatory Visit (HOSPITAL_COMMUNITY): Admission: RE | Admit: 2020-11-19 | Payer: 59 | Source: Ambulatory Visit

## 2020-11-19 ENCOUNTER — Observation Stay (HOSPITAL_COMMUNITY)
Admission: AD | Admit: 2020-11-19 | Discharge: 2020-11-21 | Disposition: A | Discharge status: Home / Self Care | Payer: 59 | Source: Ambulatory Visit | Attending: Student in an Organized Health Care Education/Training Program | Admitting: Student in an Organized Health Care Education/Training Program

## 2020-11-19 ENCOUNTER — Ambulatory Visit (HOSPITAL_COMMUNITY)
Admission: RE | Admit: 2020-11-19 | Discharge: 2020-11-19 | Disposition: A | Discharge status: Home / Self Care | Payer: 59 | Source: Ambulatory Visit | Attending: Internal Medicine | Admitting: Internal Medicine

## 2020-11-19 ENCOUNTER — Other Ambulatory Visit: Payer: Self-pay | Admitting: Internal Medicine

## 2020-11-19 VITALS — BP 143/91 | HR 68 | Temp 97.7°F

## 2020-11-19 DIAGNOSIS — E1122 Type 2 diabetes mellitus with diabetic chronic kidney disease: Secondary | ICD-10-CM | POA: Diagnosis not present

## 2020-11-19 DIAGNOSIS — I6389 Other cerebral infarction: Secondary | ICD-10-CM

## 2020-11-19 DIAGNOSIS — I639 Cerebral infarction, unspecified: Secondary | ICD-10-CM

## 2020-11-19 DIAGNOSIS — F17210 Nicotine dependence, cigarettes, uncomplicated: Secondary | ICD-10-CM | POA: Insufficient documentation

## 2020-11-19 DIAGNOSIS — Z1152 Encounter for screening for COVID-19: Secondary | ICD-10-CM

## 2020-11-19 DIAGNOSIS — E785 Hyperlipidemia, unspecified: Secondary | ICD-10-CM | POA: Diagnosis not present

## 2020-11-19 DIAGNOSIS — I480 Paroxysmal atrial fibrillation: Secondary | ICD-10-CM | POA: Diagnosis not present

## 2020-11-19 DIAGNOSIS — E875 Hyperkalemia: Secondary | ICD-10-CM

## 2020-11-19 DIAGNOSIS — Z6841 Body Mass Index (BMI) 40.0 and over, adult: Secondary | ICD-10-CM | POA: Insufficient documentation

## 2020-11-19 DIAGNOSIS — Z79899 Other long term (current) drug therapy: Secondary | ICD-10-CM | POA: Diagnosis not present

## 2020-11-19 DIAGNOSIS — Z7901 Long term (current) use of anticoagulants: Secondary | ICD-10-CM | POA: Diagnosis not present

## 2020-11-19 DIAGNOSIS — R531 Weakness: Secondary | ICD-10-CM

## 2020-11-19 DIAGNOSIS — E663 Overweight: Secondary | ICD-10-CM | POA: Insufficient documentation

## 2020-11-19 DIAGNOSIS — R471 Dysarthria and anarthria: Secondary | ICD-10-CM | POA: Diagnosis present

## 2020-11-19 DIAGNOSIS — E119 Type 2 diabetes mellitus without complications: Secondary | ICD-10-CM

## 2020-11-19 DIAGNOSIS — I10 Essential (primary) hypertension: Secondary | ICD-10-CM

## 2020-11-19 DIAGNOSIS — N183 Chronic kidney disease, stage 3 unspecified: Secondary | ICD-10-CM | POA: Diagnosis not present

## 2020-11-19 DIAGNOSIS — N179 Acute kidney failure, unspecified: Secondary | ICD-10-CM

## 2020-11-19 DIAGNOSIS — I129 Hypertensive chronic kidney disease with stage 1 through stage 4 chronic kidney disease, or unspecified chronic kidney disease: Secondary | ICD-10-CM | POA: Diagnosis not present

## 2020-11-19 DIAGNOSIS — K089 Disorder of teeth and supporting structures, unspecified: Secondary | ICD-10-CM | POA: Insufficient documentation

## 2020-11-19 DIAGNOSIS — Z7984 Long term (current) use of oral hypoglycemic drugs: Secondary | ICD-10-CM | POA: Insufficient documentation

## 2020-11-19 DIAGNOSIS — I6330 Cerebral infarction due to thrombosis of unspecified cerebral artery: Secondary | ICD-10-CM | POA: Diagnosis present

## 2020-11-19 DIAGNOSIS — I4891 Unspecified atrial fibrillation: Secondary | ICD-10-CM

## 2020-11-19 LAB — LIPID PANEL
Cholesterol: 184 mg/dL (ref 0–200)
HDL: 27 mg/dL — ABNORMAL LOW (ref >40)
LDL Cholesterol: 102 mg/dL — ABNORMAL HIGH (ref 0–99)
Total CHOL/HDL Ratio: 6.8 RATIO
Triglycerides: 274 mg/dL — ABNORMAL HIGH (ref <150)
VLDL: 55 mg/dL — ABNORMAL HIGH (ref 0–40)

## 2020-11-19 LAB — GLUCOSE, CAPILLARY
Glucose-Capillary: 143 mg/dL — ABNORMAL HIGH (ref 70–99)
Glucose-Capillary: 138 mg/dL — ABNORMAL HIGH (ref 70–99)

## 2020-11-19 LAB — URINALYSIS, COMPLETE (UACMP) WITH MICROSCOPIC
Bacteria, UA: NONE SEEN
Bilirubin Urine: NEGATIVE
Glucose, UA: NEGATIVE mg/dL
Ketones, ur: NEGATIVE mg/dL
Leukocytes,Ua: NEGATIVE
Nitrite: NEGATIVE
Protein, ur: 100 mg/dL — AB
Specific Gravity, Urine: 1.017 (ref 1.005–1.030)
pH: 5 (ref 5.0–8.0)

## 2020-11-19 LAB — BASIC METABOLIC PANEL
Anion gap: 9 (ref 5–15)
Anion gap: 8 (ref 5–15)
Anion gap: 5 (ref 5–15)
BUN: 32 mg/dL — ABNORMAL HIGH (ref 6–20)
BUN: 34 mg/dL — ABNORMAL HIGH (ref 6–20)
BUN: 31 mg/dL — ABNORMAL HIGH (ref 6–20)
CO2: 20 mmol/L — ABNORMAL LOW (ref 22–32)
CO2: 20 mmol/L — ABNORMAL LOW (ref 22–32)
CO2: 22 mmol/L (ref 22–32)
Calcium: 9 mg/dL (ref 8.9–10.3)
Calcium: 8.6 mg/dL — ABNORMAL LOW (ref 8.9–10.3)
Calcium: 8.4 mg/dL — ABNORMAL LOW (ref 8.9–10.3)
Chloride: 110 mmol/L (ref 98–111)
Chloride: 110 mmol/L (ref 98–111)
Chloride: 108 mmol/L (ref 98–111)
Creatinine, Ser: 1.48 mg/dL — ABNORMAL HIGH (ref 0.61–1.24)
Creatinine, Ser: 1.53 mg/dL — ABNORMAL HIGH (ref 0.61–1.24)
Creatinine, Ser: 1.35 mg/dL — ABNORMAL HIGH (ref 0.61–1.24)
GFR, Estimated: 57 mL/min — ABNORMAL LOW (ref >60)
GFR, Estimated: 55 mL/min — ABNORMAL LOW (ref >60)
GFR, Estimated: >60 mL/min (ref >60)
Glucose, Bld: 91 mg/dL (ref 70–99)
Glucose, Bld: 183 mg/dL — ABNORMAL HIGH (ref 70–99)
Glucose, Bld: 152 mg/dL — ABNORMAL HIGH (ref 70–99)
Potassium: 6.3 mmol/L (ref 3.5–5.1)
Potassium: 5.7 mmol/L — ABNORMAL HIGH (ref 3.5–5.1)
Potassium: 5.3 mmol/L — ABNORMAL HIGH (ref 3.5–5.1)
Sodium: 139 mmol/L (ref 135–145)
Sodium: 138 mmol/L (ref 135–145)
Sodium: 135 mmol/L (ref 135–145)

## 2020-11-19 LAB — BMP8+ANION GAP
Anion Gap: 12 mmol/L (ref 10.0–18.0)
BUN/Creatinine Ratio: 21 — ABNORMAL HIGH (ref 9–20)
BUN: 26 mg/dL — ABNORMAL HIGH (ref 6–24)
CO2: 19 mmol/L — ABNORMAL LOW (ref 20–29)
Calcium: 8.7 mg/dL (ref 8.7–10.2)
Chloride: 108 mmol/L — ABNORMAL HIGH (ref 96–106)
Creatinine, Ser: 1.26 mg/dL (ref 0.76–1.27)
GFR calc Af Amer: 76 mL/min/{1.73_m2} (ref >59)
GFR calc non Af Amer: 66 mL/min/{1.73_m2} (ref >59)
Glucose: 108 mg/dL — ABNORMAL HIGH (ref 65–99)
Potassium: 6.3 mmol/L — ABNORMAL HIGH (ref 3.5–5.2)
Sodium: 139 mmol/L (ref 134–144)

## 2020-11-19 LAB — ECHOCARDIOGRAM COMPLETE
Area-P 1/2: 2.8 cm2
Height: 69 in
S' Lateral: 2.9 cm

## 2020-11-19 LAB — MAGNESIUM: Magnesium: 1.9 mg/dL (ref 1.7–2.4)

## 2020-11-19 LAB — HEMOGLOBIN A1C
Hgb A1c MFr Bld: 7.7 % — ABNORMAL HIGH (ref 4.8–5.6)
Mean Plasma Glucose: 174.29 mg/dL

## 2020-11-19 LAB — SODIUM, URINE, RANDOM: Sodium, Ur: 123 mmol/L

## 2020-11-19 LAB — TSH: TSH: 1.611 u[IU]/mL (ref 0.350–4.500)

## 2020-11-19 LAB — CREATININE, URINE, RANDOM: Creatinine, Urine: 129.67 mg/dL

## 2020-11-19 LAB — RESP PANEL BY RT-PCR (FLU A&B, COVID) ARPGX2
Influenza A by PCR: NEGATIVE
Influenza B by PCR: NEGATIVE
SARS Coronavirus 2 by RT PCR: NEGATIVE

## 2020-11-19 IMAGING — MR MR MRA HEAD W/O CM
1 series · 21 of 48 positions shown · non-contrast
Comparison: [DATE]

CLINICAL DATA: Stroke in [REDACTED] last year with left-sided deficit
and speech disturbance. Acute stroke presentation.



[Series 5: 3d cow · axial · 0.5mm · 0.43mm/px · z∈[-93,-12]mm · 21 of 172 slices shown]
[im 1/172]
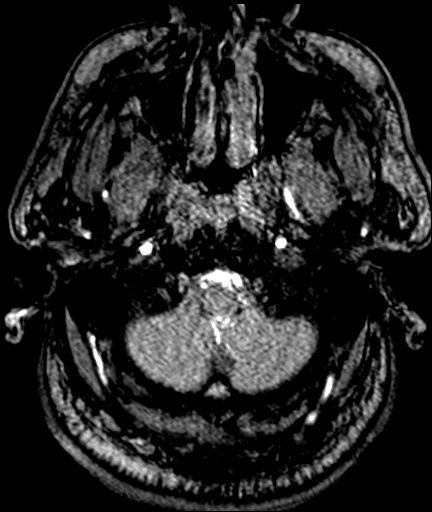
[im 4/172]
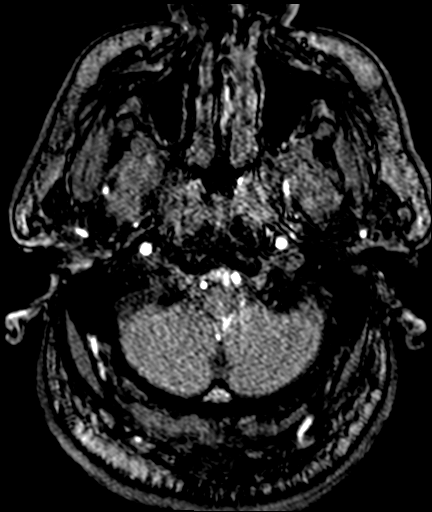
[im 8/172]
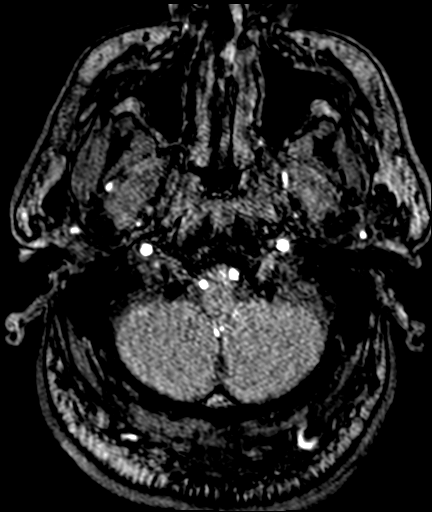
[im 11/172]
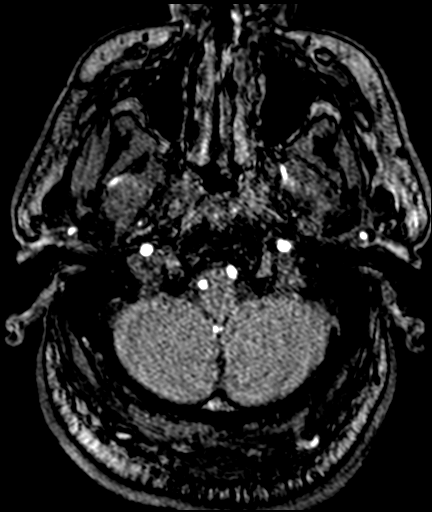
[im 15/172]
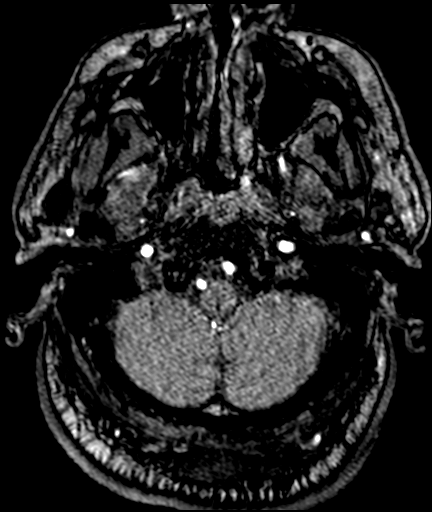
[im 19/172]
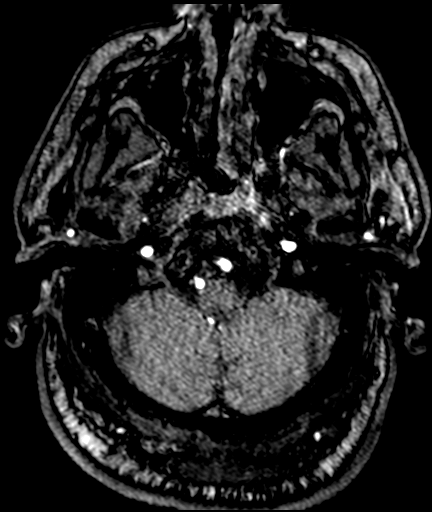
[im 22/172]
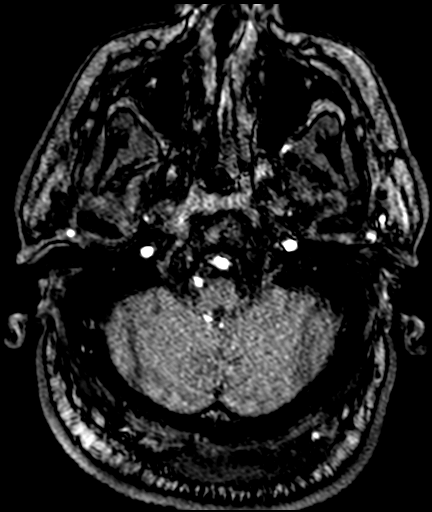
[im 26/172]
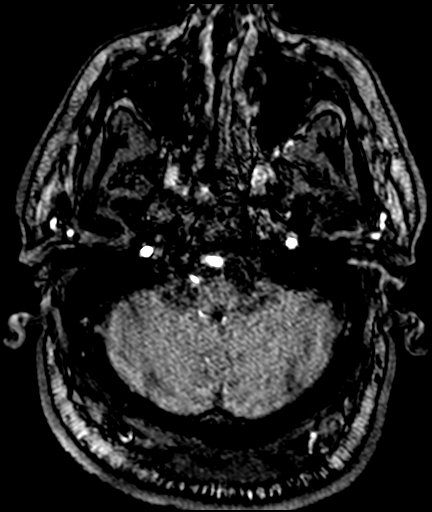
[im 30/172]
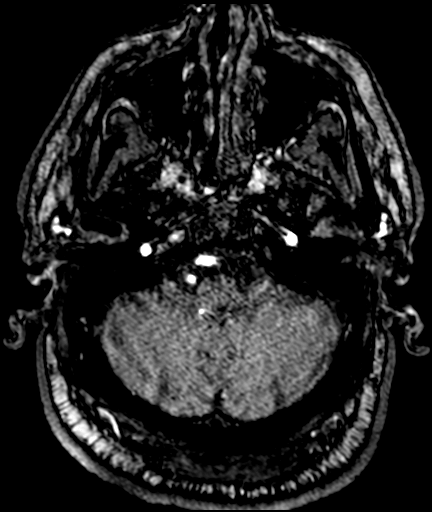
[im 33/172]
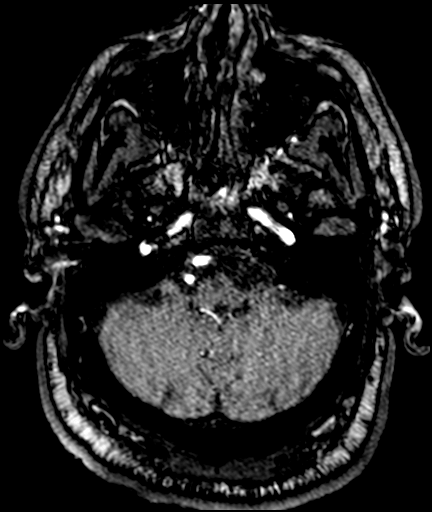
[im 37/172]
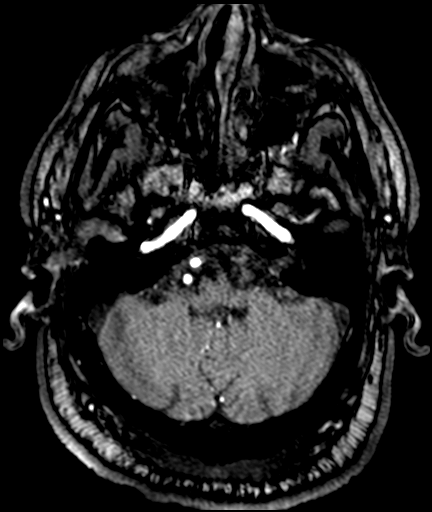
[im 41/172]
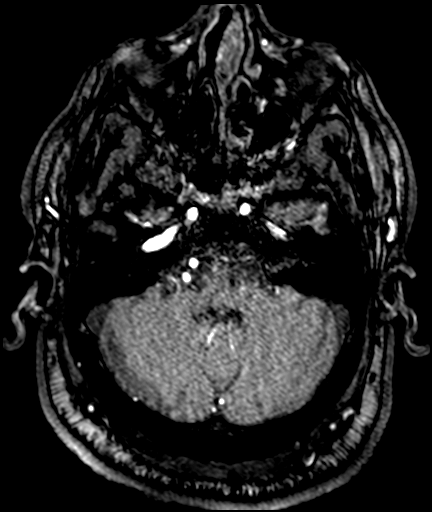
[im 44/172]
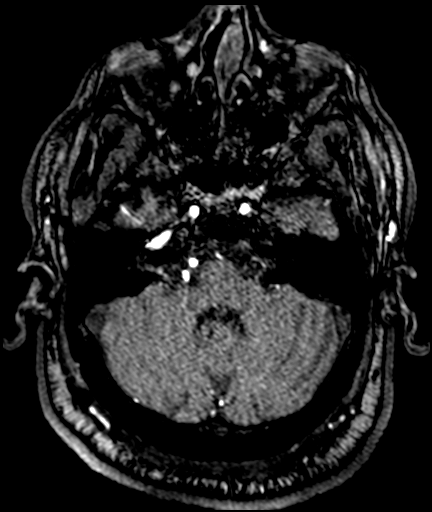
[im 55/172]
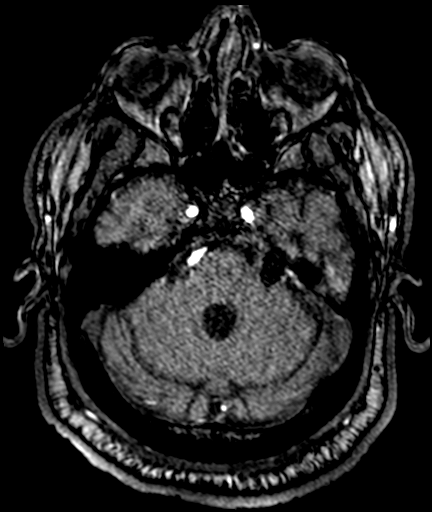
[im 77/172]
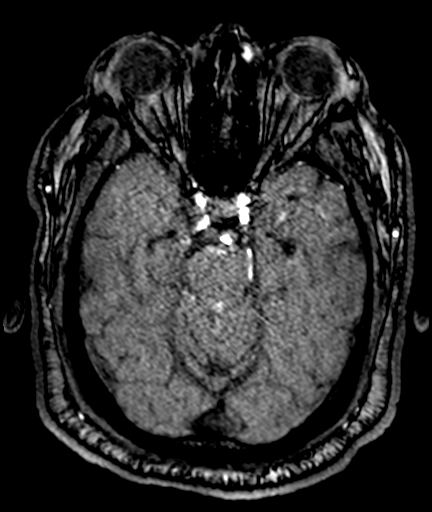
[im 88/172]
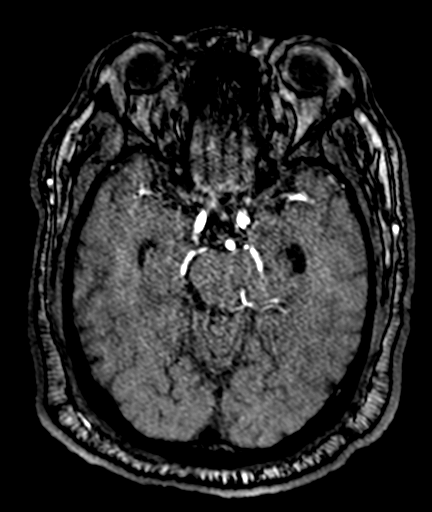
[im 99/172]
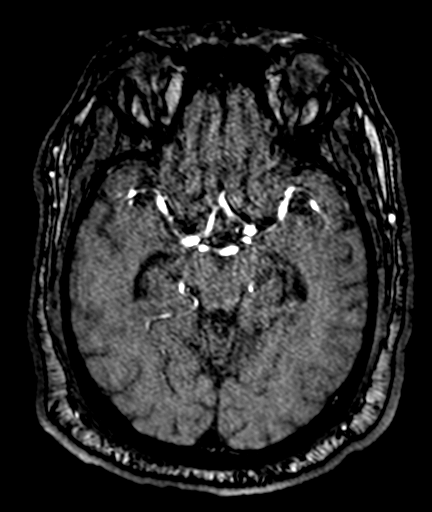
[im 121/172]
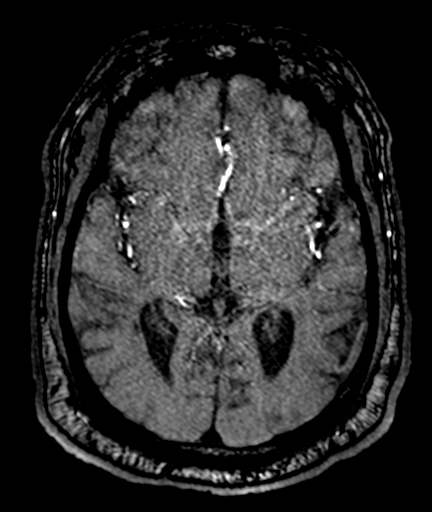
[im 142/172]
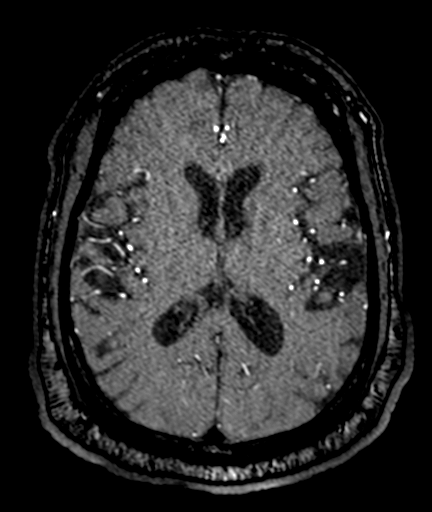
[im 146/172]
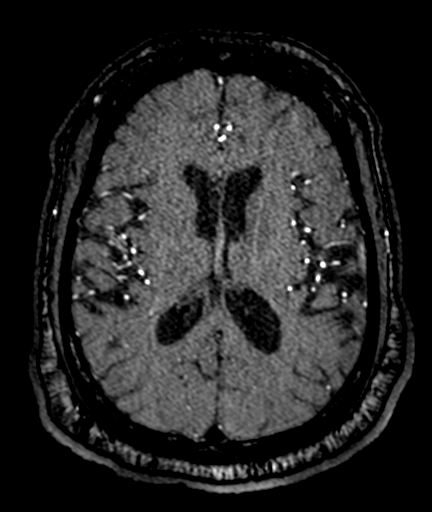
[im 164/172]
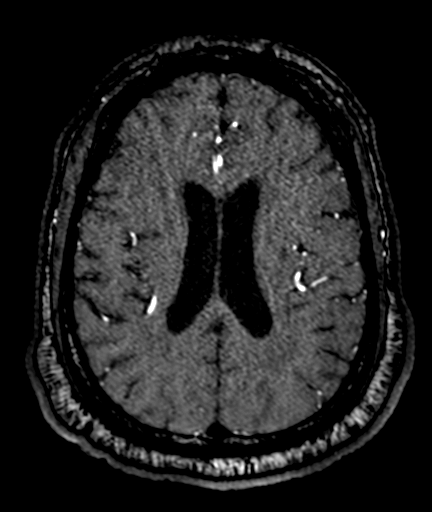

[21 of 48 positions shown; findings below may reference images not displayed]

FINDINGS: MRI HEAD FINDINGS

Brain: Diffusion imaging shows acute infarction in left pons. Second
acute infarction noted within the right lateral splenium of the
corpus callosum. Third small focus acute infarction medial left
temporal lobe/hippocampus.

Elsewhere, there are some chronic small-vessel ischemic changes of
the pons. There are old white matter infarctions primarily within
the deep and to a lesser extent the subcortical white matter of the
cerebral hemispheres. There are old small vessel thalamic
infarctions.

No mass, acute hemorrhage, hydrocephalus or extra-axial collection.

Vascular: Major vessels at the base of the brain show flow.

Skull and upper cervical spine: Negative

Sinuses/Orbits: Clear/normal

Other: None

MRA HEAD FINDINGS

Both internal carotid arteries are widely patent through the skull
base and siphon regions. There is some atherosclerotic irregularity
in the siphon regions but no flow limiting stenosis. The anterior
and middle cerebral vessels are patent without proximal stenosis or
large/medium vessel occlusion.

Both vertebral arteries are patent to the basilar. No basilar
stenosis. Posterior circulation branch vessels show flow.

MRA NECK FINDINGS

Both common carotid arteries are patent to the bifurcation regions.
No bifurcation DME ease is seen. Cervical ICAs are normal in
caliber.

Both vertebral arteries show antegrade flow. Vertebral artery
origins not visible due to technical factors. In the region
visualized, no vertebral stenosis is present.
IMPRESSION: Acute infarction in the left pons. Second acute infarction in the
right lateral splenium of the corpus callosum. Third small focus of
acute infarction in the medial left temporal lobe/hippocampus. Given
the other old ischemic changes throughout the brain, these are
likely due to micro embolic disease within the posterior
circulation.

MRA HEAD:

No large or medium vessel occlusion or correctable proximal
stenosis. Some atherosclerotic irregularity in the siphon regions
but no flow limiting stenosis.

MRA NECK:

No abnormality seen in the region visualized. Vertebral artery
origins not visible due to technical factors inherent to noncontrast
technique.

## 2020-11-19 IMAGING — MR MR MRA NECK W/O CM
1 series · 32 of 48 positions shown · non-contrast
Comparison: [DATE]

CLINICAL DATA: Stroke in [REDACTED] last year with left-sided deficit
and speech disturbance. Acute stroke presentation.



[Series 15: [id number] · axial · 0.6mm · 0.52mm/px · z∈[-275,-90]mm · 32 of 324 slices shown]
[im 1/324]
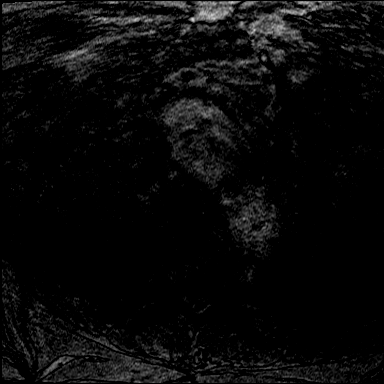
[im 7/324]
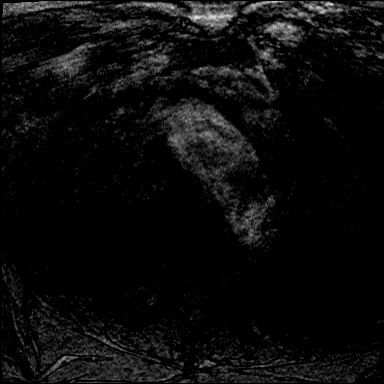
[im 14/324]
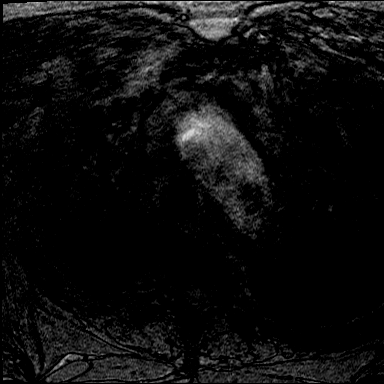
[im 21/324]
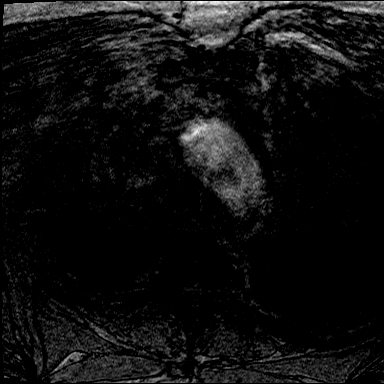
[im 28/324]
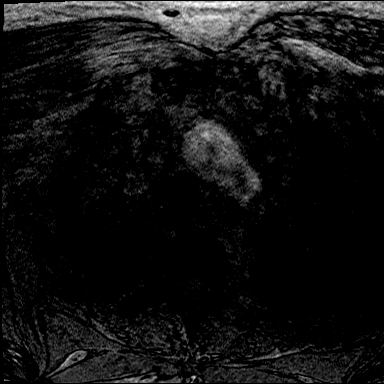
[im 35/324]
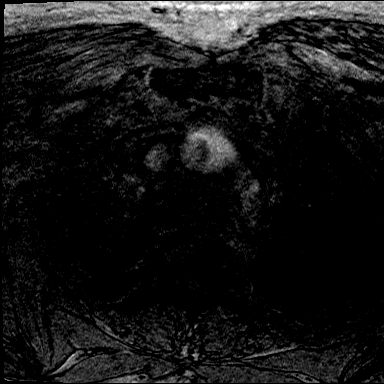
[im 42/324]
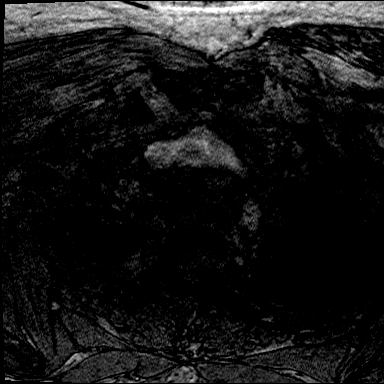
[im 49/324]
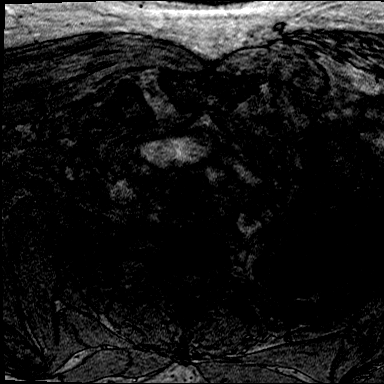
[im 55/324]
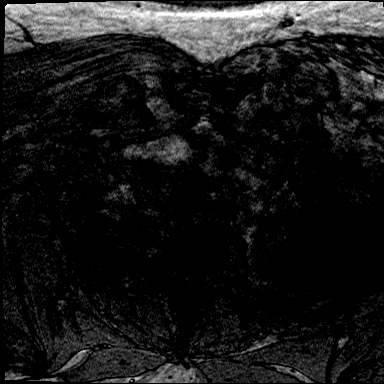
[im 62/324]
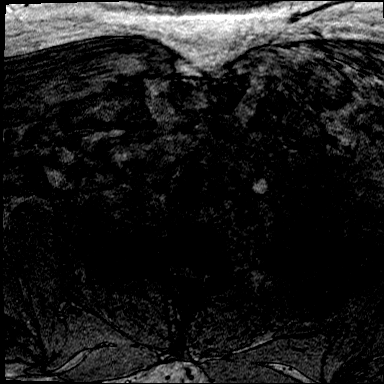
[im 69/324]
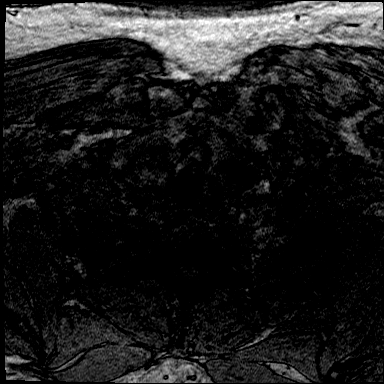
[im 76/324]
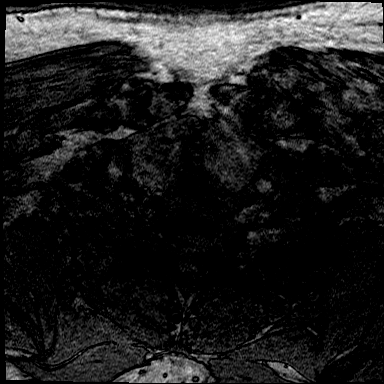
[im 83/324]
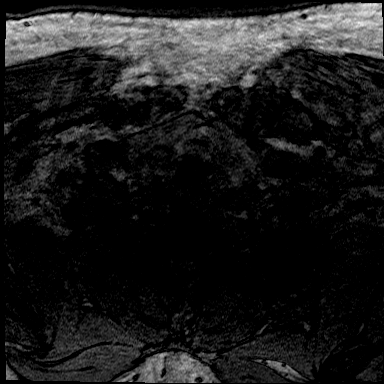
[im 90/324]
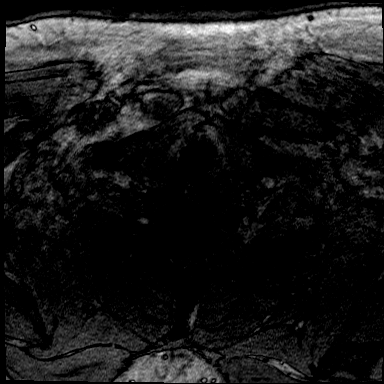
[im 97/324]
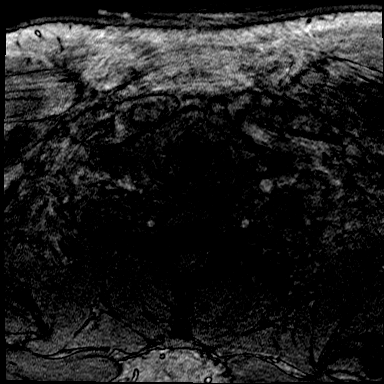
[im 104/324]
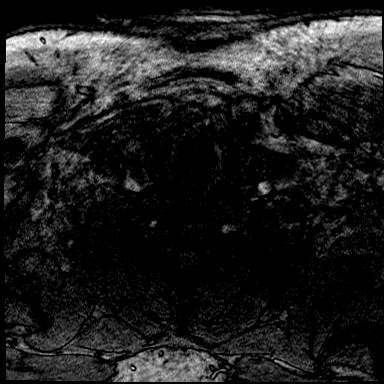
[im 110/324]
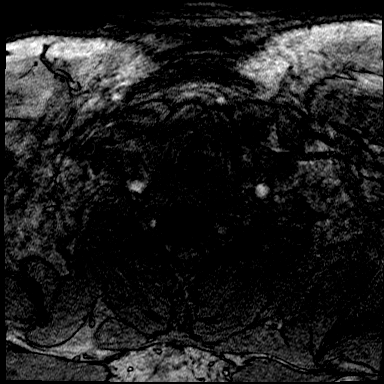
[im 117/324]
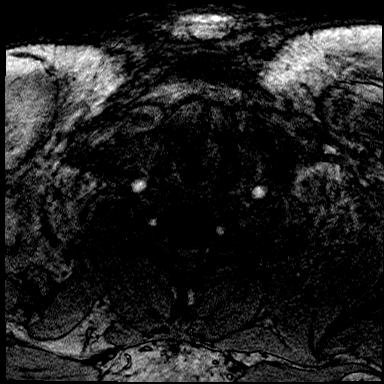
[im 124/324]
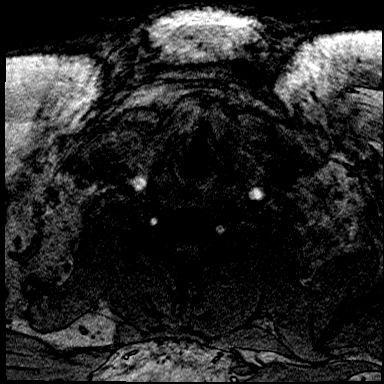
[im 131/324]
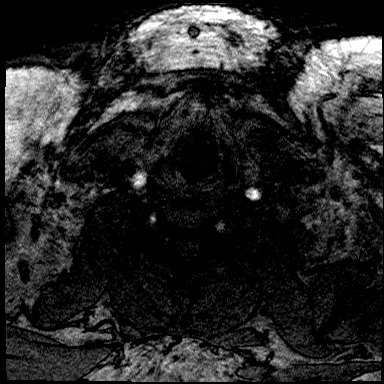
[im 138/324]
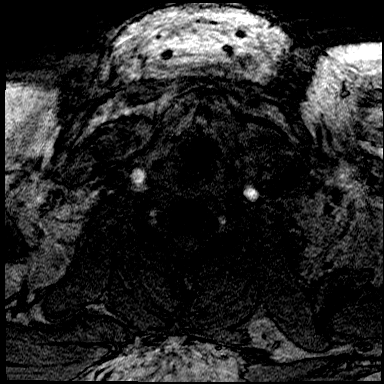
[im 145/324]
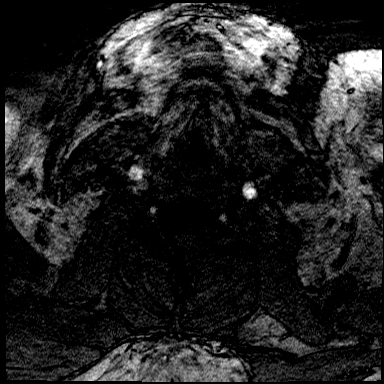
[im 152/324]
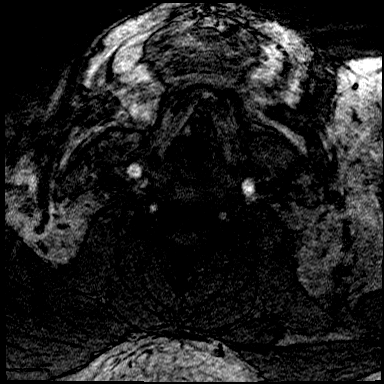
[im 159/324]
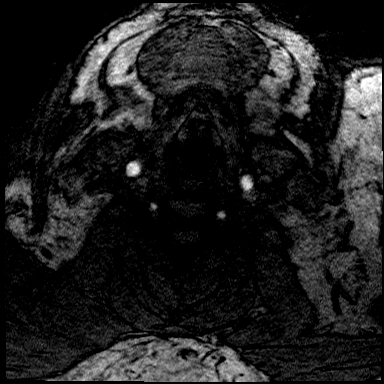
[im 165/324]
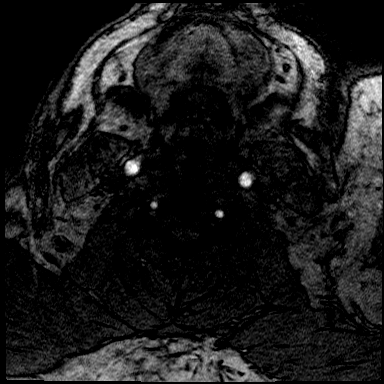
[im 172/324]
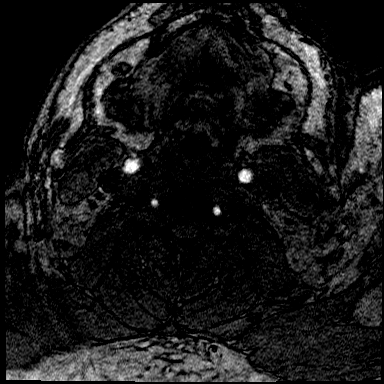
[im 179/324]
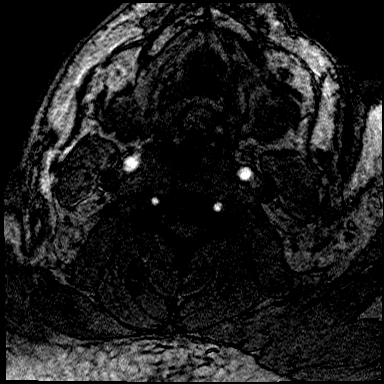
[im 186/324]
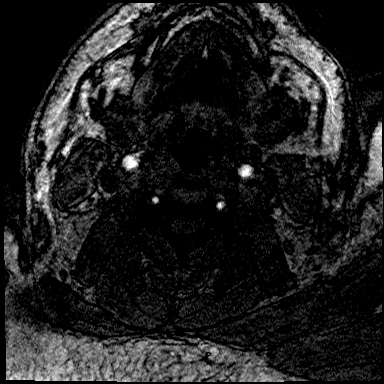
[im 227/324]
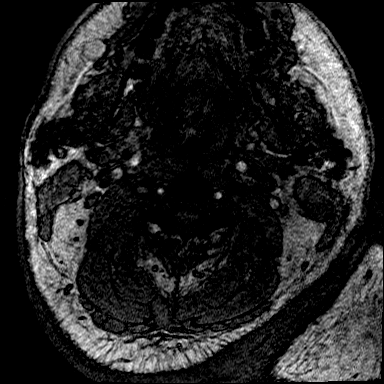
[im 269/324]
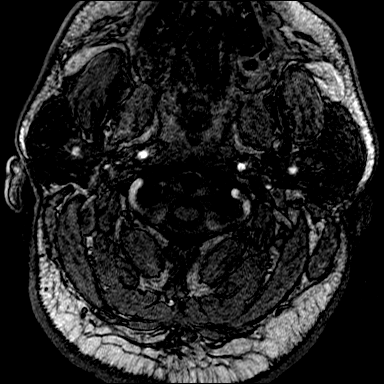
[im 275/324]
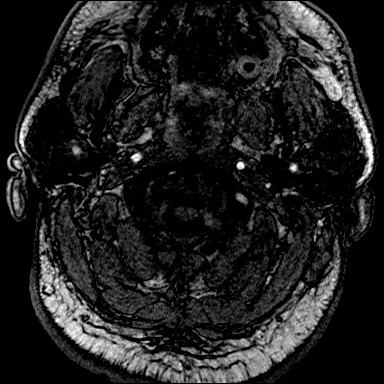
[im 310/324]
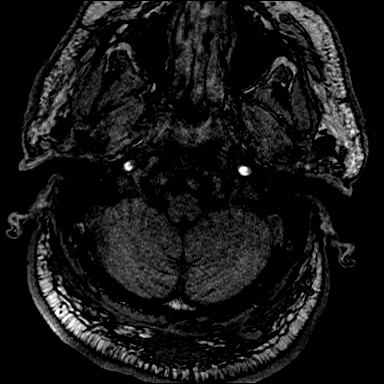

[32 of 48 positions shown; findings below may reference images not displayed]

FINDINGS: MRI HEAD FINDINGS

Brain: Diffusion imaging shows acute infarction in left pons. Second
acute infarction noted within the right lateral splenium of the
corpus callosum. Third small focus acute infarction medial left
temporal lobe/hippocampus.

Elsewhere, there are some chronic small-vessel ischemic changes of
the pons. There are old white matter infarctions primarily within
the deep and to a lesser extent the subcortical white matter of the
cerebral hemispheres. There are old small vessel thalamic
infarctions.

No mass, acute hemorrhage, hydrocephalus or extra-axial collection.

Vascular: Major vessels at the base of the brain show flow.

Skull and upper cervical spine: Negative

Sinuses/Orbits: Clear/normal

Other: None

MRA HEAD FINDINGS

Both internal carotid arteries are widely patent through the skull
base and siphon regions. There is some atherosclerotic irregularity
in the siphon regions but no flow limiting stenosis. The anterior
and middle cerebral vessels are patent without proximal stenosis or
large/medium vessel occlusion.

Both vertebral arteries are patent to the basilar. No basilar
stenosis. Posterior circulation branch vessels show flow.

MRA NECK FINDINGS

Both common carotid arteries are patent to the bifurcation regions.
No bifurcation DME ease is seen. Cervical ICAs are normal in
caliber.

Both vertebral arteries show antegrade flow. Vertebral artery
origins not visible due to technical factors. In the region
visualized, no vertebral stenosis is present.
IMPRESSION: Acute infarction in the left pons. Second acute infarction in the
right lateral splenium of the corpus callosum. Third small focus of
acute infarction in the medial left temporal lobe/hippocampus. Given
the other old ischemic changes throughout the brain, these are
likely due to micro embolic disease within the posterior
circulation.

MRA HEAD:

No large or medium vessel occlusion or correctable proximal
stenosis. Some atherosclerotic irregularity in the siphon regions
but no flow limiting stenosis.

MRA NECK:

No abnormality seen in the region visualized. Vertebral artery
origins not visible due to technical factors inherent to noncontrast
technique.

## 2020-11-19 IMAGING — MR MR HEAD W/O CM
12 of 13 series · 44 of 48 positions shown · non-contrast
Comparison: [DATE]

CLINICAL DATA: Stroke in [REDACTED] last year with left-sided deficit
and speech disturbance. Acute stroke presentation.



[Series 5: DWI · axial · 3.0mm · 0.88mm/px · z∈[-105,+54]mm · 8 of 108 slices shown (1 of 4)]
[im 1/108]
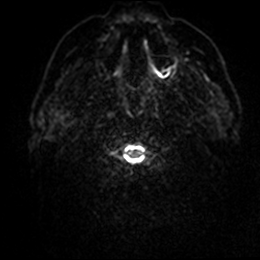
[im 16/108]
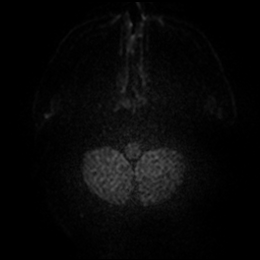
[im 31/108]
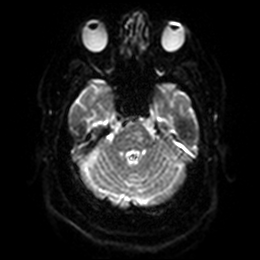
[im 46/108]
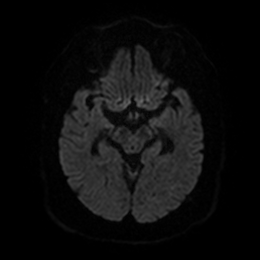
[im 62/108]
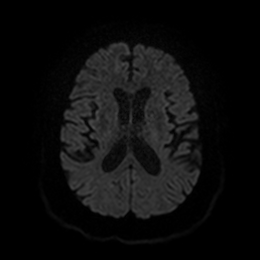
[im 77/108]
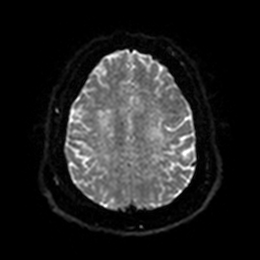
[im 92/108]
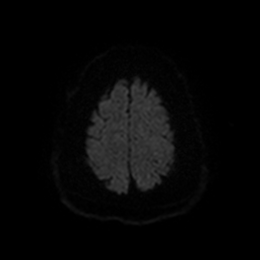
[im 108/108]
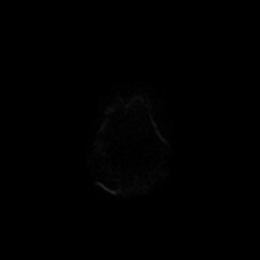

[Series 6: DWI · axial · 3.0mm · 0.88mm/px · z∈[-105,+54]mm · 4 of 54 slices shown (2 of 4)]
[im 1/54]
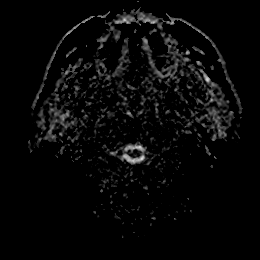
[im 18/54]
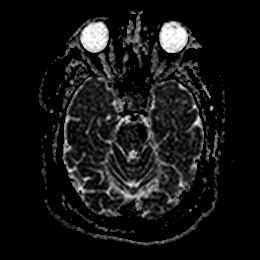
[im 36/54]
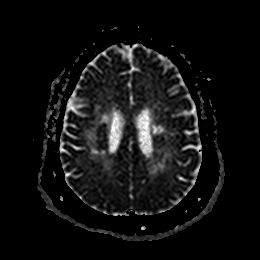
[im 54/54]
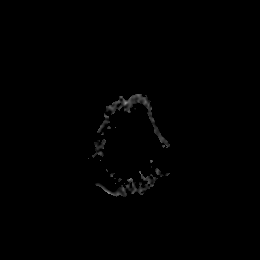

[Series 7: DWI · coronal · 4.0mm · 0.88mm/px · 5 of 72 slices shown (3 of 4)]
[im 1/72]
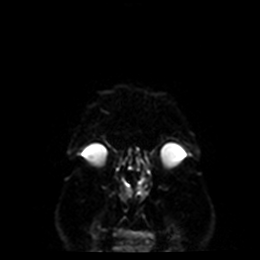
[im 18/72]
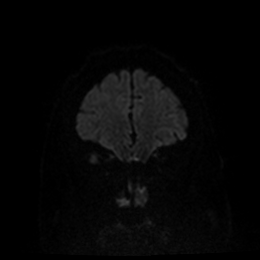
[im 36/72]
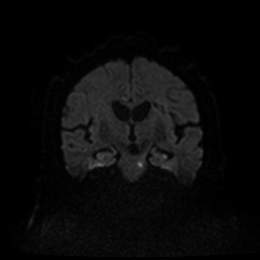
[im 54/72]
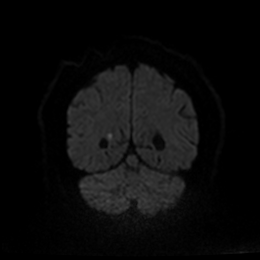
[im 72/72]
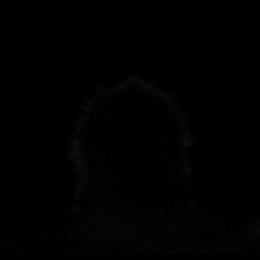

[Series 8: DWI · coronal · 4.0mm · 0.88mm/px · 3 of 36 slices shown (4 of 4)]
[im 1/36]
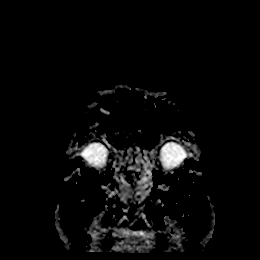
[im 18/36]
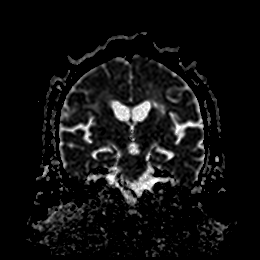
[im 36/36]
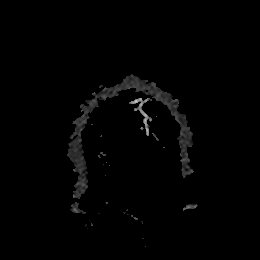

[Series 9: FLAIR · axial · 5.0mm · 0.45mm/px · z∈[-102,+48]mm · 2 of 26 slices shown]
[im 1/26]
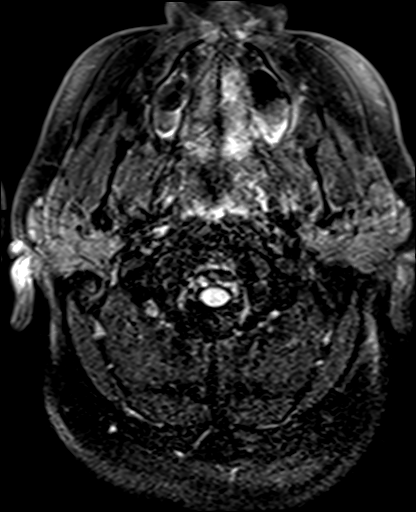
[im 26/26]
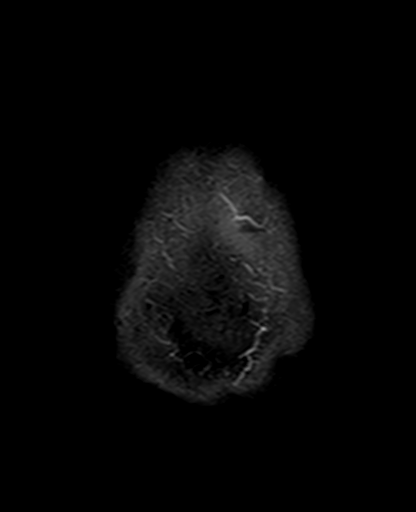

[Series 10: mag_images · axial · 3.0mm · 0.90mm/px · z∈[-109,+56]mm · 4 of 56 slices shown]
[im 1/56]
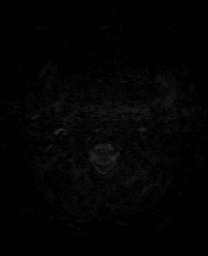
[im 19/56]
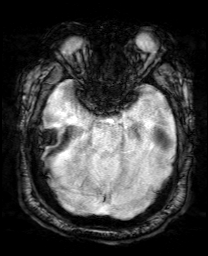
[im 37/56]
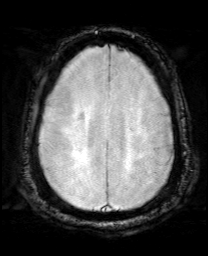
[im 56/56]
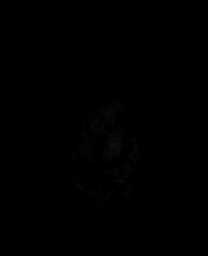

[Series 11: pha_images · axial · 3.0mm · 0.90mm/px · z∈[-109,+56]mm · 4 of 56 slices shown]
[im 1/56]
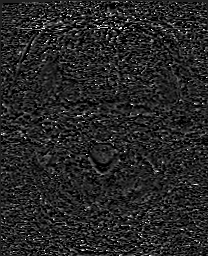
[im 19/56]
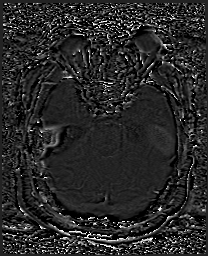
[im 37/56]
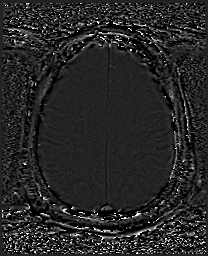
[im 56/56]
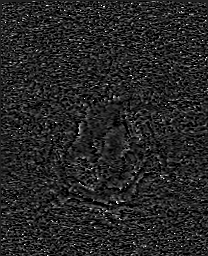

[Series 12: swi_images · axial · 3.0mm · 0.90mm/px · z∈[-109,+56]mm · 4 of 56 slices shown]
[im 1/56]
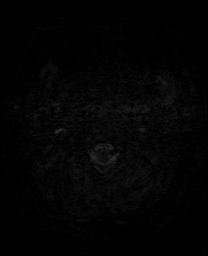
[im 19/56]
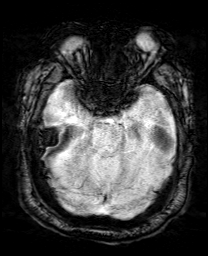
[im 37/56]
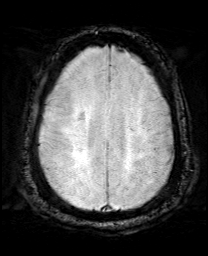
[im 56/56]
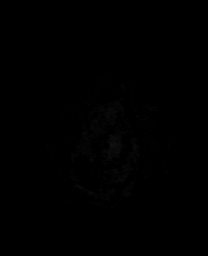

[Series 13: mip_images(sw) · axial · 24.0mm · 0.90mm/px · z∈[-99,+45]mm · 4 of 49 slices shown]
[im 1/49]
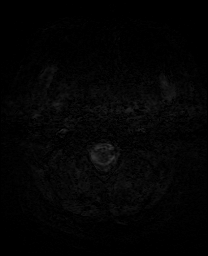
[im 17/49]
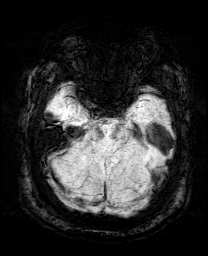
[im 33/49]
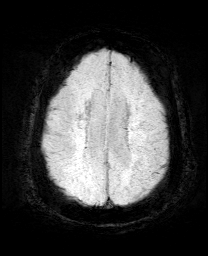
[im 49/49]
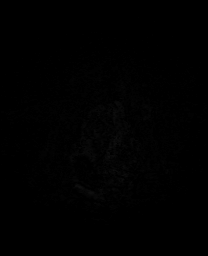

[Series 14: T1 · sagittal · 5.0mm · 0.75mm/px · 2 of 25 slices shown]
[im 1/25]
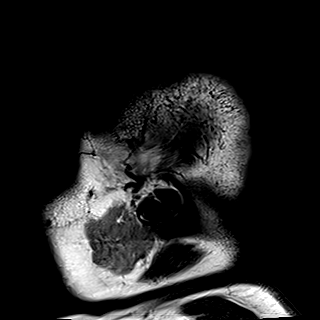
[im 25/25]
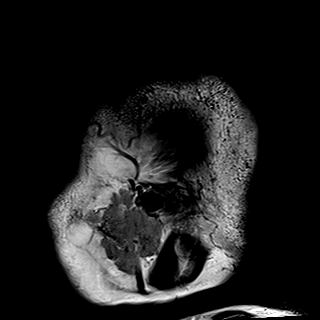

[Series 15: T2 · axial · 5.0mm · 0.72mm/px · z∈[-97,+46]mm · 2 of 25 slices shown (1 of 2)]
[im 1/25]
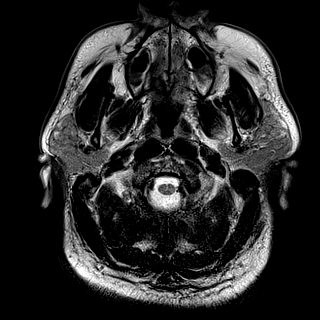
[im 25/25]
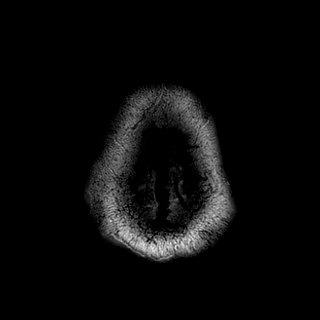

[Series 22: T2 · coronal · 5.0mm · 0.34mm/px · 2 of 30 slices shown (2 of 2)]
[im 1/30]
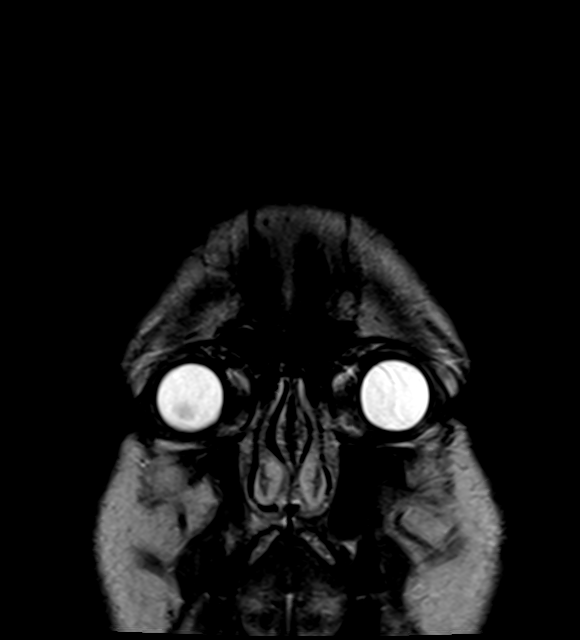
[im 30/30]
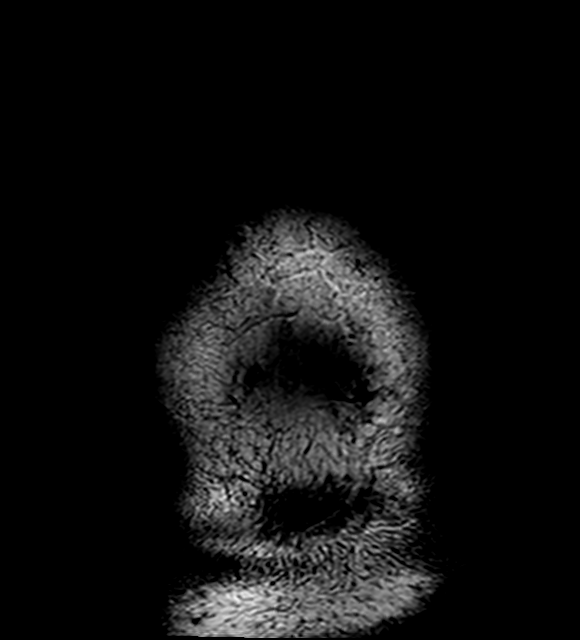

[44 of 48 positions shown; findings below may reference images not displayed]

FINDINGS: MRI HEAD FINDINGS

Brain: Diffusion imaging shows acute infarction in left pons. Second
acute infarction noted within the right lateral splenium of the
corpus callosum. Third small focus acute infarction medial left
temporal lobe/hippocampus.

Elsewhere, there are some chronic small-vessel ischemic changes of
the pons. There are old white matter infarctions primarily within
the deep and to a lesser extent the subcortical white matter of the
cerebral hemispheres. There are old small vessel thalamic
infarctions.

No mass, acute hemorrhage, hydrocephalus or extra-axial collection.

Vascular: Major vessels at the base of the brain show flow.

Skull and upper cervical spine: Negative

Sinuses/Orbits: Clear/normal

Other: None

MRA HEAD FINDINGS

Both internal carotid arteries are widely patent through the skull
base and siphon regions. There is some atherosclerotic irregularity
in the siphon regions but no flow limiting stenosis. The anterior
and middle cerebral vessels are patent without proximal stenosis or
large/medium vessel occlusion.

Both vertebral arteries are patent to the basilar. No basilar
stenosis. Posterior circulation branch vessels show flow.

MRA NECK FINDINGS

Both common carotid arteries are patent to the bifurcation regions.
No bifurcation DME ease is seen. Cervical ICAs are normal in
caliber.

Both vertebral arteries show antegrade flow. Vertebral artery
origins not visible due to technical factors. In the region
visualized, no vertebral stenosis is present.
IMPRESSION: Acute infarction in the left pons. Second acute infarction in the
right lateral splenium of the corpus callosum. Third small focus of
acute infarction in the medial left temporal lobe/hippocampus. Given
the other old ischemic changes throughout the brain, these are
likely due to micro embolic disease within the posterior
circulation.

MRA HEAD:

No large or medium vessel occlusion or correctable proximal
stenosis. Some atherosclerotic irregularity in the siphon regions
but no flow limiting stenosis.

MRA NECK:

No abnormality seen in the region visualized. Vertebral artery
origins not visible due to technical factors inherent to noncontrast
technique.

## 2020-11-19 MED ORDER — SODIUM ZIRCONIUM CYCLOSILICATE 5 G PO PACK: 10.0000 g | PACK | Freq: Once | ORAL | Status: DC

## 2020-11-19 MED ORDER — INSULIN ASPART 100 UNIT/ML ~~LOC~~ SOLN
0.0000 [IU] | Freq: Three times a day (TID) | SUBCUTANEOUS | Status: DC
  Administered 2020-11-19 – 2020-11-20 (×2): 2 [IU] via SUBCUTANEOUS
  Administered 2020-11-20: 3 [IU] via SUBCUTANEOUS

## 2020-11-19 MED ORDER — INSULIN ASPART 100 UNIT/ML ~~LOC~~ SOLN: 0.0000 [IU] | Freq: Every day | SUBCUTANEOUS | Status: DC

## 2020-11-19 MED ORDER — ATORVASTATIN CALCIUM 80 MG PO TABS
80.0000 mg | ORAL_TABLET | Freq: Every day | ORAL | Status: DC
  Administered 2020-11-20 – 2020-11-21 (×2): 80 mg via ORAL
  Filled 2020-11-19 (×3): qty 1

## 2020-11-19 MED ORDER — SODIUM ZIRCONIUM CYCLOSILICATE 5 G PO PACK
5.0000 g | PACK | Freq: Once | ORAL | Status: AC
  Administered 2020-11-19: 5 g via ORAL
  Filled 2020-11-19: qty 1

## 2020-11-19 MED ORDER — SODIUM CHLORIDE 0.9 % IV BOLUS
500.0000 mL | Freq: Once | INTRAVENOUS | Status: AC
  Administered 2020-11-19: 500 mL via INTRAVENOUS

## 2020-11-19 MED ORDER — CALCIUM GLUCONATE 10 % IV SOLN: 1.0000 g | Freq: Once | INTRAVENOUS | Status: DC

## 2020-11-19 MED ORDER — ACETAMINOPHEN 650 MG RE SUPP: 650.0000 mg | Freq: Four times a day (QID) | RECTAL | Status: DC | PRN

## 2020-11-19 MED ORDER — ACETAMINOPHEN 325 MG PO TABS: 650.0000 mg | ORAL_TABLET | Freq: Four times a day (QID) | ORAL | Status: DC | PRN

## 2020-11-19 MED ORDER — FLECAINIDE ACETATE 100 MG PO TABS
100.0000 mg | ORAL_TABLET | Freq: Two times a day (BID) | ORAL | Status: DC
  Administered 2020-11-19 – 2020-11-21 (×4): 100 mg via ORAL
  Filled 2020-11-19 (×6): qty 1

## 2020-11-19 MED ORDER — PANTOPRAZOLE SODIUM 40 MG PO TBEC
40.0000 mg | DELAYED_RELEASE_TABLET | Freq: Every day | ORAL | Status: DC
  Administered 2020-11-20 – 2020-11-21 (×2): 40 mg via ORAL
  Filled 2020-11-19 (×2): qty 1

## 2020-11-19 MED ORDER — SODIUM CHLORIDE 0.9% FLUSH
3.0000 mL | Freq: Two times a day (BID) | INTRAVENOUS | Status: DC
  Administered 2020-11-19 – 2020-11-21 (×3): 3 mL via INTRAVENOUS

## 2020-11-19 MED ORDER — RIVAROXABAN 20 MG PO TABS
20.0000 mg | ORAL_TABLET | Freq: Every day | ORAL | Status: DC
  Administered 2020-11-20: 20 mg via ORAL
  Filled 2020-11-19 (×2): qty 1

## 2020-11-19 MED ORDER — CALCIUM GLUCONATE-NACL 1-0.675 GM/50ML-% IV SOLN
1.0000 g | Freq: Once | INTRAVENOUS | Status: AC
  Administered 2020-11-19: 1000 mg via INTRAVENOUS
  Filled 2020-11-19: qty 50

## 2020-11-19 MED ORDER — SODIUM ZIRCONIUM CYCLOSILICATE 10 G PO PACK
10.0000 g | PACK | Freq: Once | ORAL | Status: DC
  Filled 2020-11-19: qty 1

## 2020-11-19 MED ORDER — METOPROLOL SUCCINATE ER 100 MG PO TB24
100.0000 mg | ORAL_TABLET | Freq: Every day | ORAL | Status: DC
  Administered 2020-11-20 – 2020-11-21 (×2): 100 mg via ORAL
  Filled 2020-11-19 (×2): qty 1

## 2020-11-19 MED ORDER — LOKELMA 10 G PO PACK: 10.0000 g | PACK | Freq: Once | ORAL | 0 refills | Status: DC

## 2020-11-19 NOTE — H&P (Addendum)
Date: 11/19/2020               Patient Name:  [name]. MRN: [id number]  DOB: March 25, 1971 Age / Sex: 50 y.o., male   PCP: Evlyn Kanner, MD              Medical Service: Internal Medicine Teaching Service              Attending Physician: Dr. Oswaldo Done, Marquita Palms, *    First Contact: Dr. Marijo Conception Pager: [phone]  Second Contact: Dr. Ephriam Knuckles Pager: [phone]            After Hours (After 5p/  First Contact Pager: [phone]  weekends / holidays): Second Contact Pager: [phone]   Chief Complaint: Hyperkalemia  History of Present Illness:   [name]. Is a 50 y.o. 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 Reports no alcohol use since ablation, and reduction in tobacco use to one pack a week.    Patient had presented to PCP office yesterday after greater than 48 hrs of acute symptomology, so was not a candidate for acute intervention. Was unable to perform CT w/o contrast at the time due to schedulers having left. Xarelto held. Had planned for outpatient CT head today, but admission pursued in light of hyperkalemia, with peaked t waves.  Patient reports three days ago he noticed weakness in the right arm and leg. Felt unsteady with weight bearing on right leg. Additionally noted worsening to his dysarthria, which had previously been improving with therapy. He does report continued difficulty swallowing following the previous CVA in 2021, sometimes choking on liquids. He has not noticed any changes to this in the past few days. Denies vision changes. He has had difficulty controlling his bladder following his prior CVA, but reports noticed increased difficulty with this in the past few days,  especially at night. Additionally conveys was unable to control a bowel movement yesterday, which is atypical for him. No BM since.  He reports has had shortness of breath with walking over the past few days. Denies sob at rest. Denies chest pain. Has not noticed any swelling to extremities. He denies dizziness. He denies palpitations or cramps or changes in vision.   Per chart review, patient previously on Spironolactone, but this was stopped in December when discovered to be hyperkalemic to 5.9. Patient recently resumed taking Spironolactone. Patient unsure if he was taking spironolactone prior to January, but has been since. Found to be hyperkalemic to 6.3 today at PCP office, with peaked T waves on EKG. Given 10 g dose of Lokelma. He endorses has had vague muscle spasms.   Reports compliance with medications, including Xarelto. He expresses he has not tolerated trials on Narcolepsy medications, and stopped taking adderall, as he did not like how it made him feel.    Meds:   Patient endorses taking: Metformin 1,000mg  Glipizide 10mg  BID Trulicity  Xarelto 20mg  Flecainide 100 mg BID Toprol XL 100 mg daily Lisinopril  Current Meds  Medication Sig  . amphetamine-dextroamphetamine (ADDERALL XR) 30 MG 24 hr capsule Take 1 capsule (30 mg total) by mouth daily as needed (focus). 1 daily as needed (Patient taking differently: Take 30 mg by mouth daily as needed (to stay alert at work).)  .  Dulaglutide (TRULICITY) 0.75 MG/0.5ML SOPN Inject 0.75 mg into the skin once a week. (Patient taking differently: Inject 0.75 mg into the skin every Monday.)  . flecainide (TAMBOCOR) 100 MG tablet Take 1 tablet (100 mg total) by mouth 2 (two) times daily.  Marland Kitchen glipiZIDE (GLUCOTROL) 10 MG tablet Take 10 mg by mouth 2 (two) times daily before a meal.  . ibuprofen (ADVIL) 200 MG tablet Take 400 mg by mouth 2 (two) times daily as needed (pain).  Marland Kitchen lactose free nutrition (BOOST) LIQD Take 237 mLs by mouth daily.  Marland Kitchen  lisinopril (ZESTRIL) 40 MG tablet Take 40 mg by mouth daily with supper.  . metFORMIN (GLUCOPHAGE) 500 MG tablet Take 1,000 mg by mouth 2 (two) times daily with a meal.   . metoprolol succinate (TOPROL XL) 100 MG 24 hr tablet Take 1 tablet (100 mg total) by mouth daily. Take with or immediately following a meal.  . nicotine (NICODERM CQ - DOSED IN MG/24 HOURS) 21 mg/24hr patch Place 21 mg onto the skin daily as needed (smoking cessation).  . pantoprazole (PROTONIX) 40 MG tablet Take 1 tablet (40 mg total) by mouth daily. (Patient taking differently: Take 40 mg by mouth daily with supper.)  . spironolactone (ALDACTONE) 25 MG tablet Take 25 mg by mouth daily with supper.  Carlena Hurl 20 MG TABS tablet TAKE 1 TABLET (20 MG TOTAL) BY MOUTH DAILY WITH SUPPER. (Patient taking differently: Take 20 mg by mouth daily with supper.)     Allergies: Allergies as of 11/19/2020 - Review Complete 11/19/2020  Allergen Reaction Noted  . Shrimp [shellfish allergy] Anaphylaxis 05/11/2012  . Banana Other (See Comments) 05/05/2013  . Watermelon flavor Nausea And Vomiting 05/11/2012  . Almond oil Itching 10/29/2013  . Peanut-containing drug products Itching and Cough 04/07/2016  . Sulfa antibiotics Itching 05/13/2012   Past Medical History:  Diagnosis Date  . Diabetes mellitus   . GERD (gastroesophageal reflux disease)   . History of alcohol abuse   . History of cocaine use   . History of echocardiogram    a. Echo 4/14: Moderate LVH, vigorous LVEF, EF 65-70%, normal wall motion, grade 2 diastolic dysfunction, mildly dilated aortic root and ascending aorta, ascending aorta 40 mm, aortic root 38 mm, mild LAE  . Hx of cardiovascular stress test    a. GXT 5/14: No ischemic changes  //  b. ETT-Myoview 3/16:  Low risk, no ischemia, EF 58%  . Hyperlipidemia   . Hypertension   . Morbid obesity (HCC)   . Paroxysmal atrial fibrillation (HCC)    Occurring in 2008, with several recurrence since then (including in the  setting of + cocaine on UDS).  . Sleep apnea    uses CPAP  . SVT (supraventricular tachycardia) (HCC)    mid to long RP SVT 09/2019    Family History: Reports extensive familial hx of HTN and DM. Hx of prostatic cancer in uncle, and colon cancer in father. Hx of lupus in nephew, who recently passed from COVID.    Social History:  Patient lives with sister and nephews.  Reports has recently cut down to 1 pack of cigarettes a week. 30 year hx of tobacco use Hx of cocaine use. Reports no use in 6-7 years.   Review of Systems: A complete ROS was negative except as per HPI.   Physical Exam: Blood pressure 123/85, pulse 68, temperature 98.1 F (36.7 C), temperature source Oral, resp. rate 16, SpO2 99 %.  Physical Exam Constitutional:  General: He is not in acute distress.    Appearance: He is obese. He is not ill-appearing, toxic-appearing or diaphoretic.     Comments: Pleasant obese gentleman seen sitting in wheelchair, later laying in bed comfortably. NAD. Conversant and engaged.   Cardiovascular:     Rate and Rhythm: Normal rate and regular rhythm.     Pulses:          Dorsalis pedis pulses are 2+ on the right side and 2+ on the left side.  Pulmonary:     Effort: Pulmonary effort is normal. No tachypnea or respiratory distress.     Breath sounds: Transmitted upper airway sounds present. Wheezing (mild diffuse end-expiratory wheezes) present.     Comments: Upper airway wheeze Musculoskeletal:     Right lower leg: No edema.     Left lower leg: No edema.     Comments: 4/5 in RUE. 4-5/5 in RLE. 5/5 strength in left arms and legs. Onchomycosis present.  Skin:    General: Skin is warm and dry.  Neurological:     Mental Status: He is alert and oriented to person, place, and time.     Cranial Nerves: Dysarthria (moderate) present.     Coordination: Finger-Nose-Finger Test and Heel to [facility] Test normal.     Deep Tendon Reflexes:     Reflex Scores:      Patellar reflexes are 2+  on the right side and 2+ on the left side.    Comments: Patient moderately dysarthric. Right sided facial droop noted with smiling. Grip strength 4/5, arm and elbow flexion 4/5 CN II-XII otherwise within normal limits Sensation intact upper and lower extremities  Psychiatric:        Mood and Affect: Mood normal.        Behavior: Behavior normal.     EKG: personally reviewed my interpretation is NSR with peaked T waves  Assessment & Plan by Problem: Active Problems:   Hyperkalemia   Hyperkalemia  AKI Patient with potassium of 6.3 on admission. Likely iatrogenic secondary to spironolactone as this was stopped and then restarted recently. Peaked T waves on EKG. Reporting some vague muscular tenderness. S/p lokelma 10 in clinic. Patient presenting with creatinine mildly elevated from baseline at 1.48. Urinalysis unchanged from previous PTA, showing moderate Hgb per dipstick, with some 6-10 RBCs and proteinuria.   -Holding home lisinopril and spironolactone  -received 500cc bolus x2 -f/u urine sodium and creatinine -Received calcium gluconate on arrival - repeat bmp stat   Suspected Subacute CVA Fecal incontinence Patient with history of prior CVA in Aug 2021 and PAF (s/p ablation), with reported left-sided deficits that have been improving with therapy presenting with onset of right-sided focality starting three days ago, additionally endorsing worsening bladder control, and recent new-onset fecal incontinence. Likely neurogenic. Reports has been compliant on xarelto (taken for PAF). Denies cocaine use in last 6-7 years. Likely contribution of difficult to manage HTN vs thromboembolic.   - MRI head - MRA/carotid US - repeat echo  - PT/OT/SLP with new dysarthria  - bladder scan  T2DM Patient with hx of DM, with recent A1C of 9.9. Endorses compliance with medications. A1C 7.7 this admission. -ssi   Atrial Fibrillation continue home flecainide (hx of PAF s/p ablation) and Toprol  XL Restart Dilt prn if needed   Dispo: Admit patient to Inpatient with expected length of stay greater than 2 midnights.  Signed: Audria Nine, Medical Student 11/19/2020, 2:28 PM  Pager: @[phone] @

## 2020-11-19 NOTE — Progress Notes (Signed)
CC: Hypertension, Right Sided Weakness  HPI:  [name]. is a 50 y.o. male with a past medical history stated below and presents today for high blood pressure and right sided weakness. Please see problem based assessment and plan for additional details.  Past Medical History:  Diagnosis Date  . Diabetes mellitus   . GERD (gastroesophageal reflux disease)   . History of alcohol abuse   . History of cocaine use   . History of echocardiogram    a. Echo 4/14: Moderate LVH, vigorous LVEF, EF 65-70%, normal wall motion, grade 2 diastolic dysfunction, mildly dilated aortic root and ascending aorta, ascending aorta 40 mm, aortic root 38 mm, mild LAE  . Hx of cardiovascular stress test    a. GXT 5/14: No ischemic changes  //  b. ETT-Myoview 3/16:  Low risk, no ischemia, EF 58%  . Hyperlipidemia   . Hypertension   . Morbid obesity (HCC)   . Paroxysmal atrial fibrillation (HCC)    Occurring in 2008, with several recurrence since then (including in the setting of + cocaine on UDS).  . Sleep apnea    uses CPAP  . SVT (supraventricular tachycardia) (HCC)    mid to long RP SVT 09/2019    Current Outpatient Medications on File Prior to Visit  Medication Sig Dispense Refill  . amphetamine-dextroamphetamine (ADDERALL XR) 30 MG 24 hr capsule Take 1 capsule (30 mg total) by mouth daily as needed (focus). 1 daily as needed 30 capsule 0  . atorvastatin (LIPITOR) 40 MG tablet Take 1 tablet (40 mg total) by mouth daily. 30 tablet 1  . Blood Pressure Monitoring (BLOOD PRESSURE MONITOR/L CUFF) MISC 1 Units by Does not apply route daily. 1 each 0  . diltiazem (CARDIZEM) 30 MG tablet Take 1 Tablet Every 4 Hours As Needed For HR >100 (Patient taking differently: Take 30 mg by mouth every 4 (four) hours as needed. For HR >100) 45 tablet 2  . Dulaglutide (TRULICITY) 0.75 MG/0.5ML SOPN Inject 0.75 mg into the skin once a week. 2 mL 5  . flecainide (TAMBOCOR) 100 MG tablet Take 1 tablet (100  mg total) by mouth 2 (two) times daily. 180 tablet 3  . glipiZIDE (GLUCOTROL) 10 MG tablet Take 10 mg by mouth 2 (two) times daily.    Marland Kitchen lisinopril (ZESTRIL) 40 MG tablet Take 40 mg by mouth daily.    . metFORMIN (GLUCOPHAGE) 500 MG tablet Take 1,000 mg by mouth 2 (two) times daily with a meal.     . metoprolol succinate (TOPROL XL) 100 MG 24 hr tablet Take 1 tablet (100 mg total) by mouth daily. Take with or immediately following a meal. 30 tablet 11  . ondansetron (ZOFRAN ODT) 8 MG disintegrating tablet Take 1 tablet (8 mg total) by mouth every 8 (eight) hours as needed for nausea or vomiting. 12 tablet 0  . pantoprazole (PROTONIX) 40 MG tablet Take 1 tablet (40 mg total) by mouth daily. 30 tablet 5  . spironolactone (ALDACTONE) 25 MG tablet Take 25 mg by mouth daily.    Carlena Hurl 20 MG TABS tablet TAKE 1 TABLET (20 MG TOTAL) BY MOUTH DAILY WITH SUPPER. 30 tablet 6   No current facility-administered medications on file prior to visit.    Family History  Problem Relation Age of Onset  . Diabetes Mother   . Heart attack Father 93  . Stroke Maternal Grandmother   . Stroke Paternal Grandmother   . Diabetes Paternal Grandfather  Social History   Socioeconomic History  . Marital status: Single    Spouse name: Not on file  . Number of children: 0  . Years of education: 22  . Highest education level: Not on file  Occupational History  . Occupation: Designer, industrial/product: UNEMPLOYED  Tobacco Use  . Smoking status: Current Every Day Smoker    Packs/day: 0.50    Years: 28.00    Pack years: 14.00    Types: Cigarettes  . Smokeless tobacco: Former Neurosurgeon    Types: Snuff  Vaping Use  . Vaping Use: Never used  Substance and Sexual Activity  . Alcohol use: Not Currently    Alcohol/week: 6.0 standard drinks    Types: 6 Standard drinks or equivalent per week    Comment: monthly  . Drug use: Not Currently    Comment: cocaine in the past, none currently  . Sexual activity: Not on  file  Other Topics Concern  . Not on file  Social History Narrative   Fun: Photographer       Lives in [place] with sister.      Unemployed, was working a Office manager job   Social Determinants of Community education officer: Not on file  Food Insecurity: Not on file  Transportation Needs: Not on file  Physical Activity: Not on file  Stress: Not on file  Social Connections: Not on file  Intimate Partner Violence: Not on file    Review of Systems: ROS negative except for what is noted on the assessment and plan.  Vitals:   11/18/20 1555 11/18/20 1606  BP: (!) 176/95 (!) 176/96  Pulse: 70 68  Temp: 98.2 F (36.8 C)   TempSrc: Oral   SpO2: 100%    Physical Exam: Constitutional: well-appearing, resting in chair comfortably HENT: normocephalic atraumatic, mucous membranes moist.  Eyes: conjunctiva non-erythematous. PERRL.  Neck: supple Cardiovascular: regular rate and rhythm, no m/r/g Pulmonary/Chest: normal work of breathing on room air, lungs clear to auscultation bilaterally Abdominal:  non-distended MSK: normal bulk and tone Neurological: alert & oriented x 3, 4/5 strength RUE. 5/5 strength LUE. RLE 4/5 strength. LLE 5/5 strength. Normal finger to nose and heel to shin. Off balanced when turning.  Skin: warm and dry Psych: normal mood   Assessment & Plan:   See Encounters Tab for problem based charting.  Patient seen with Dr. Mackey Birchwood, D.O. [facility], PGY-1 Pager: [phone], Phone: [phone] Date 11/19/2020 Time 12:43 PM

## 2020-11-19 NOTE — Consult Note (Signed)
NEUROLOGY CONSULTATION NOTE   Date of service: November 19, 2020 Patient Name: [name]. MRN:  [id number] DOB:  Dec 26, 1970 Reason for consult: "Stroke" _ _ _   _ __   _ __ _ _  __ __   _ __   __ _  History of Present Illness  Douglas Mullenbach. is a 50 y.o. male with PMH significant for smoking, morbid obesity, OSA, DM2, HTN, substance use, pAfibb s/p Ablation on Xarelto and dCHF who p/w 4 day hx of R arm and leg weakness with dysarthria. He went to bed on 11/14/29 and woke up the next day with these symptoms. 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 MR Angio head and neck is negative for a LVO.  He endorses compliance with Xarelto. Reports that his blood pressure has been consistently elevated. Also has hx of OSA and his CPAP pressure needs to be readjusted and is scheduled for a sleep study.  NIHSS components Score: Comment  1a Level of Conscious 0[x]  1[]  2[]  3[]      1b LOC Questions 0[x]  1[]  2[]       1c LOC Commands 0[x]  1[]  2[]       2 Best Gaze 0[x]  1[]  2[]       3 Visual 0[x]  1[]  2[]  3[]      4 Facial Palsy 0[]  1[x]  2[]  3[]      5a Motor Arm - left 0[x]  1[]  2[]  3[]  4[]  UN[]    5b Motor Arm - Right 0[x]  1[]  2[]  3[]  4[]  UN[]    6a Motor Leg - Left 0[x]  1[]  2[]  3[]  4[]  UN[]    6b Motor Leg - Right 0[x]  1[]  2[]  3[]  4[]  UN[]    7 Limb Ataxia 0[x]  1[]  2[]  3[]  UN[]     8 Sensory 0[x]  1[]  2[]  UN[]      9 Best Language 0[x]  1[]  2[]  3[]      10 Dysarthria 0[]  1[x]  2[]  UN[]      11 Extinct. and Inattention 0[x]  1[]  2[]       TOTAL: 2    MRS: 0 Outside window for tPA and Thrombectomy.   ROS   Constitutional Denies weight loss, fever and chills.  HEENT Denies changes in vision and hearing.  Respiratory Denies SOB and cough.  CV Denies palpitations and CP  GI Denies abdominal pain, nausea, vomiting and diarrhea.  GU Denies dysuria and urinary  frequency.  MSK Denies myalgia and joint pain.  Skin Denies rash and pruritus.  Neurological Denies headache and syncope.  Psychiatric Denies recent changes in mood. Denies anxiety and depression.   Past History   Past Medical History:  Diagnosis Date  . Diabetes mellitus   . GERD (gastroesophageal reflux disease)   . History of alcohol abuse   . History of cocaine use   . History of echocardiogram    a. Echo 4/14: Moderate LVH, vigorous LVEF, EF 65-70%, normal wall motion, grade 2 diastolic dysfunction, mildly dilated aortic root and ascending aorta, ascending aorta 40 mm, aortic root 38 mm, mild LAE  . Hx of cardiovascular stress test    a. GXT 5/14: No ischemic changes  //  b. ETT-Myoview 3/16:  Low risk, no ischemia, EF 58%  . Hyperlipidemia   . Hypertension   . Morbid obesity (HCC)   . Paroxysmal atrial fibrillation (HCC)    Occurring in 2008, with several recurrence since then (including in the setting of + cocaine on UDS).  Marland Kitchen  Sleep apnea    uses CPAP  . SVT (supraventricular tachycardia) (HCC)    mid to long RP SVT 09/2019   Past Surgical History:  Procedure Laterality Date  . APPENDECTOMY    . ATRIAL FIBRILLATION ABLATION N/A 11/27/2019   Procedure: ATRIAL FIBRILLATION ABLATION;  Surgeon: [name], MD;  Location: [facility] INVASIVE CV LAB;  Service: Cardiovascular;  Laterality: N/A;  . ROTATOR CUFF REPAIR    . SVT ABLATION N/A 11/27/2019   Procedure: SVT ABLATION;  Surgeon: [name], MD;  Location: [facility] INVASIVE CV LAB;  Service: Cardiovascular;  Laterality: N/A;  . TONSILLECTOMY     Family History  Problem Relation Age of Onset  . Diabetes Mother   . Heart attack Father 63  . Stroke Maternal Grandmother   . Stroke Paternal Grandmother   . Diabetes Paternal Grandfather    Social History   Socioeconomic History  . Marital status: Single    Spouse name: Not on file  . Number of children: 0  . Years of education: 106  . Highest education level: Not on file   Occupational History  . Occupation: Designer, industrial/product: UNEMPLOYED  Tobacco Use  . Smoking status: Current Every Day Smoker    Packs/day: 0.50    Years: 28.00    Pack years: 14.00    Types: Cigarettes  . Smokeless tobacco: Former Neurosurgeon    Types: Snuff  Vaping Use  . Vaping Use: Never used  Substance and Sexual Activity  . Alcohol use: Not Currently    Alcohol/week: 6.0 standard drinks    Types: 6 Standard drinks or equivalent per week    Comment: monthly  . Drug use: Not Currently    Comment: cocaine in the past, none currently  . Sexual activity: Not on file  Other Topics Concern  . Not on file  Social History Narrative   Fun: Photographer       Lives in [place] with sister.      Unemployed, was working a Office manager job   Social Determinants of Corporate investment banker Strain: Not on file  Food Insecurity: Not on file  Transportation Needs: Not on file  Physical Activity: Not on file  Stress: Not on file  Social Connections: Not on file   Allergies  Allergen Reactions  . Shrimp [Shellfish Allergy] Anaphylaxis  . Banana Other (See Comments)    Pt gags  . Watermelon Flavor Nausea And Vomiting  . Other Itching    Grapes cause itching  . Almond Oil Itching    Roof of mouth itches  . Peanut-Containing Drug Products Itching and Cough  . Sulfa Antibiotics Itching    Medications   Medications Prior to Admission  Medication Sig Dispense Refill Last Dose  . amphetamine-dextroamphetamine (ADDERALL XR) 30 MG 24 hr capsule Take 1 capsule (30 mg total) by mouth daily as needed (focus). 1 daily as needed (Patient taking differently: Take 30 mg by mouth daily as needed (to stay alert at work).) 30 capsule 0 month ago  . Dulaglutide (TRULICITY) 0.75 MG/0.5ML SOPN Inject 0.75 mg into the skin once a week. (Patient taking differently: Inject 0.75 mg into the skin every Monday.) 2 mL 5 11/15/2020  . flecainide (TAMBOCOR) 100 MG tablet Take 1 tablet (100 mg total) by  mouth 2 (two) times daily. 180 tablet 3 11/19/2020 at am  . glipiZIDE (GLUCOTROL) 10 MG tablet Take 10 mg by mouth 2 (two) times daily before a meal.   11/19/2020 at  am  . ibuprofen (ADVIL) 200 MG tablet Take 400 mg by mouth 2 (two) times daily as needed (pain).   11/18/2020  . lactose free nutrition (BOOST) LIQD Take 237 mLs by mouth daily.   11/18/2020 at am  . lisinopril (ZESTRIL) 40 MG tablet Take 40 mg by mouth daily with supper.   11/18/2020 at pm  . metFORMIN (GLUCOPHAGE) 500 MG tablet Take 1,000 mg by mouth 2 (two) times daily with a meal.    11/19/2020 at am  . metoprolol succinate (TOPROL XL) 100 MG 24 hr tablet Take 1 tablet (100 mg total) by mouth daily. Take with or immediately following a meal. 30 tablet 11 11/19/2020 at 6am  . nicotine (NICODERM CQ - DOSED IN MG/24 HOURS) 21 mg/24hr patch Place 21 mg onto the skin daily as needed (smoking cessation).   few weeks ago  . pantoprazole (PROTONIX) 40 MG tablet Take 1 tablet (40 mg total) by mouth daily. (Patient taking differently: Take 40 mg by mouth daily with supper.) 30 tablet 5 11/18/2020 at pm  . spironolactone (ALDACTONE) 25 MG tablet Take 25 mg by mouth daily with supper.   11/18/2020 at pm  . XARELTO 20 MG TABS tablet TAKE 1 TABLET (20 MG TOTAL) BY MOUTH DAILY WITH SUPPER. (Patient taking differently: Take 20 mg by mouth daily with supper.) 30 tablet 6 11/18/2020 at 1800  . amLODipine (NORVASC) 5 MG tablet Take 1 tablet (5 mg total) by mouth daily. 30 tablet 11 not yet  . atorvastatin (LIPITOR) 40 MG tablet Take 1 tablet (40 mg total) by mouth daily. (Patient not taking: No sig reported) 30 tablet 1 Not Taking at Unknown time  . Blood Pressure Monitoring (BLOOD PRESSURE MONITOR/L CUFF) MISC 1 Units by Does not apply route daily. 1 each 0   . diltiazem (CARDIZEM) 30 MG tablet Take 1 Tablet Every 4 Hours As Needed For HR >100 (Patient not taking: Reported on 11/19/2020) 45 tablet 2 Not Taking at Unknown time  . sodium zirconium cyclosilicate (LOKELMA) 10 g  PACK packet Take 10 g by mouth once for 1 dose. 1 packet 0 Dec 2021     Vitals   Vitals:   11/19/20 1424 11/19/20 1430 11/19/20 1959  BP: 123/85  (!) 173/96  Pulse: 68  69  Resp: 16  20  Temp: 98.1 F (36.7 C)  (!) 97.4 F (36.3 C)  TempSrc: Oral  Oral  SpO2: 99%  99%  Height:  5\' 9"  (1.753 m)      Body mass index is 46.1 kg/m.  Physical Exam   General: Laying comfortably in bed; in no acute distress.  HENT: Normal oropharynx and mucosa. Normal external appearance of ears and nose.  Neck: Supple, no pain or tenderness  CV: No JVD. No peripheral edema.  Pulmonary: Symmetric Chest rise. Normal respiratory effort.  Abdomen: Soft to touch, non-tender.  Ext: No cyanosis, edema, or deformity  Skin: No rash. Normal palpation of skin.   Musculoskeletal: Normal digits and nails by inspection. No clubbing.   Neurologic Examination  Mental status/Cognition: Alert, oriented to self, place, month and year, good attention.  Speech/language: Mildly dysarthric speech, Fluent, comprehension intact, object naming intact, repetition intact.  Cranial nerves:   CN II Pupils equal and reactive to light, no VF deficits    CN III,IV,VI EOM intact, no gaze preference or deviation, no nystagmus    CN V normal sensation in V1, V2, and V3 segments bilaterally    CN VII R facial  droop, mild.   CN VIII normal hearing to speech    CN IX & X normal palatal elevation, no uvular deviation    CN XI 5/5 head turn and 5/5 shoulder shrug bilaterally    CN XII midline tongue protrusion    Motor:  Muscle bulk: normal, tone normal, pronator drift yes RUE tremor none Mvmt Root Nerve  Muscle Right Left Comments  SA C5/6 Ax Deltoid 5 5   EF C5/6 Mc Biceps 5 5   EE C6/7/8 Rad Triceps 5 5   WF C6/7 Med FCR 5 5   WE C7/8 PIN ECU 5 5   F Ab C8/T1 U ADM/FDI 5 5   HF L1/2/3 Fem Illopsoas 5 5   KE L2/3/4 Fem Quad 5 5   DF L4/5 D Peron Tib Ant 5 5   PF S1/2 Tibial Grc/Sol 5 5    Reflexes:  Right Left  Comments  Pectoralis      Biceps (C5/6) 2 2   Brachioradialis (C5/6) 2 2    Triceps (C6/7) 2 2    Patellar (L3/4) 2 2    Achilles (S1)      Hoffman      Plantar     Jaw jerk    Sensation:  Light touch Intact throughout   Pin prick    Temperature    Vibration   Proprioception    Coordination/Complex Motor:  - Finger to Nose intact BL - Heel to shin intact BL - Rapid alternating movement are normal - Gait: Deferred.  Labs   CBC: No results for input(s): WBC, NEUTROABS, HGB, HCT, MCV, PLT in the last 168 hours.  Basic Metabolic Panel:  Lab Results  Component Value Date   NA 138 11/19/2020   K 5.7 (H) 11/19/2020   CO2 20 (L) 11/19/2020   GLUCOSE 183 (H) 11/19/2020   BUN 34 (H) 11/19/2020   CREATININE 1.53 (H) 11/19/2020   CALCIUM 8.6 (L) 11/19/2020   GFRNONAA 55 (L) 11/19/2020   GFRAA 76 11/18/2020   Lipid Panel:  Lab Results  Component Value Date   LDLCALC 102 (H) 11/19/2020   HgbA1c:  Lab Results  Component Value Date   HGBA1C 7.7 (H) 11/19/2020   Urine Drug Screen:     Component Value Date/Time   LABOPIA NONE DETECTED 06/01/2020 2100   COCAINSCRNUR NONE DETECTED 06/01/2020 2100   COCAINSCRNUR NEGATIVE 01/20/2013 1112   LABBENZ NONE DETECTED 06/01/2020 2100   LABBENZ NEGATIVE 01/20/2013 1112   AMPHETMU NONE DETECTED 06/01/2020 2100   THCU NONE DETECTED 06/01/2020 2100   LABBARB NONE DETECTED 06/01/2020 2100    Alcohol Level     Component Value Date/Time   ETH <10 07/08/2019 0814    MRI Brain  Acute infarction in the left pons. Second acute infarction in the right lateral splenium of the corpus callosum. Third small focus of acute infarction in the medial left temporal lobe/hippocampus.  MR Angio head and neck: No LVO  Impression   Douglas Carles. is a 50 y.o. male with PMH significant for pafibb on Xarelto, hx of HTN poorly controlled and OSA, DM2 who presents with dysarthria and R facial droop. He is outside the window for any  intervention at the time of presentation. MRI Brain demonstrated small infarct in left pons, R lateral corpus callosum, medial left hippocampus. Althou he does have afibb and these maybe embolic, my highest suspicion is that these strokes are likely due to small vessel disease specially given that these  are mostly white matter infarcts and he has poorly controlled HTN and OSA.  Recommendations  Plan:  - I ordered Frequent Neuro checks per stroke unit protocol - TTE with EF of 60-65%, mildly dilated left atrium. No shunt on color flow doppler. - Lipid panel pending - Please start statin if LDL > 70 - HbA1c pending. - Can resume home Xarelto 20mg  every evening. - Recommend DVT ppx - SBP goal - aim for gradual normotension, he is outside the permissive hypertension window - Recommend Telemetry monitoring for arrythmia - Recommend bedside swallow screen prior to PO intake. - Stroke education booklet - Recommend PT/OT/SLP consult  ______________________________________________________________________   Thank you for the opportunity to take part in the care of this patient. If you have any further questions, please contact the neurology consultation attending.  Signed,  Erick BlinksSalman Oluwafemi Villella [facility] Pager Number [phone] _ _ _   _ __   _ __ _ _  __ __   _ __   __ _

## 2020-11-19 NOTE — Assessment & Plan Note (Signed)
BP Readings from Last 3 Encounters:  11/19/20 123/85  11/19/20 (!) 143/91  11/18/20 (!) 176/96    Assessment: BP appears somewhat improved. Will need adjustment of medications once discharged and to have continued close follow up with clinic to get BP under control. Also believe patient not using CPAP is contributing.   Plan: -d/c spiro in setting of hyperkalemia -patient will need CPAP for Hx of OSA -inpatient management of HTN medications

## 2020-11-19 NOTE — Progress Notes (Addendum)
Brief care note   [name] was evaluated in our clinic yesterday afternoon for a routine follow-up, though explained that he had developed gradual onset weakness in the right arm and leg 2 to 3 days prior.  He was seen for routine post stroke rehab session earlier this week and his objective findings were evidently not alarming at that time.  Very mild right sided hemiparesis noted yesterday-he is able to swallow and to walk unassisted, though balance seems to be affected to some degree.  Given subacute presentation past the window of intervention, plan was for noncontrast CT outpatient to rule out the low likelihood of hemorrhagic CVA so that dual antiplatelet therapy could commence, if appropriate.  Xarelto placed on hold.  Plan was for CT today followed by a previously scheduled rehab evaluation same day, at which time gait assessment would determine whether an assistive device would be recommended.  Renal function was checked late yesterday, and today resulted with potassium 6.3 which is associated with peaked T waves on EKG.  Because of long wait time in ED, he was asked to cometo our clinic this morning for K recheck and for EKG.  He is asymptomatic. K was done stat though is still pending.  Placed call  to charge nurse in ED, where there are 25 people waiting, all hallway beds are full and waiting for room placement.  She explains that [name] would be waiting in the waiting room until triage was able to find him a bed.  In the meantime, today's outpatient brain CT is placed on hold (they were able to accommodate him earlier this morning) and we will order a dose of Lokelma to be administered in the clinic while we investigate whether direct placement under observation status in the hospital is an option.  Dr. Charissa Bash, attending

## 2020-11-19 NOTE — Progress Notes (Signed)
Pt was seen together with Dr. Evie Lacks and plan for continued care was made together as options were explored for treatment as the morning progressed.  Yesterday, plan was for outpatient non-contrasted CT of brain to be done today (to r/o hemorrhagic CVA and inform appropriate stroke therapy) followed by a previously scheduled PT/OT Rehab appt at which time his functional needs could be more fully assessed.  BMP was drawn yesterday to evaluate renal function given concurrent ARB and spironolactone and HTN had been uncontrolled (gradual outpatient lowering of hypertension was planned with amlodipine).  Because the ED and hospital were full and he was past the window of aggressive stroke treatment such as thrombolysis), outpatient management was deemed acceptable. Xarelto was placed on hold until the CT ruled out hemorrhage.  The hyperkalemia on yesterday's BMP is associated with peaked T waves on EKG and while there are no arrhythmias, urgency in cardiac monitoring was acknowledged while a second BMP was resulting.  The ED was unfortunately so full that they were unable to accommodate him; the hallway beds already being occupied and > 20 patients on hold, he would have had to wait in the waiting area until a vacant monitoring opportunity was available.  Direct admission to observation status was then sought; in the meantime he was treated with Lokelma 10 g and remained asymptomatic (perhaps some improvement in the R hemiparesis compared to yesterday).    Confirmatory BMP resulted with unchanged K of 6.3.  ARB and spironolactone to be held.  Bed in hospital available.  Once his hyperkalemia is safely addressed, brain imaging and PT/OT evaluation can proceed as originally intended.

## 2020-11-19 NOTE — Plan of Care (Signed)
  Problem: Clinical Measurements: Goal: Diagnostic test results will improve Outcome: Progressing   Problem: Coping: Goal: Level of anxiety will decrease Outcome: Progressing   

## 2020-11-19 NOTE — Assessment & Plan Note (Addendum)
BP Readings from Last 3 Encounters:  11/19/20 (!) 143/91  11/18/20 (!) 176/96  11/16/20 132/85   Assessment: Patient with history of difficult to control blood pressure despite medication. Current regimen of toprol xl 100 mg, lisinopril 40 mg, spironolactone 25 mg daily. Aldosterone/renin ratio normal. Renal artery ultrasound does not appear to have been done, however, it was ordered by Dr. Autumn Patty.  It does not appear as though he is adherent with a CPAP and has a history of OSA. I suspect this may be a contributing factor to persistent HTN. We will add on amlodipine today and check potassium to determine of spironolactone needs to be discontinued.   Plan: - Continue current regimen, add amlodipine - If hyperkalemic will discontinue spironolactone.

## 2020-11-19 NOTE — Progress Notes (Signed)
Carotid completed   Please see CV Proc for preliminary results.   Jordynn Marcella, RVT  

## 2020-11-19 NOTE — Progress Notes (Signed)
Bladder scan completed. Pt. Able to empty bladder, 250cc output. Post void residual, 0cc.

## 2020-11-19 NOTE — Progress Notes (Signed)
Pt given Lokelma 5g packet X 2 (10 g total) by mouth at 1145. NDC: [id number] Lot # [id number] EXP: 02/13/2023 [employer]

## 2020-11-19 NOTE — Assessment & Plan Note (Addendum)
Assessment: Mr. Dasaro has a PMHx of CVA in 05/2020. He was found to have a negative CT, but positive MRI, small acute infarct of the left caudate body. He was evaluated by neurology and continued on his xarelto, started on atorvastatin and follow up with them on an outpatient basis. He denies any residual weakness from his prior stroke.   He has been following with physical therapy recently and on 11/16/20 (two days prior to his evaluation), he noticed he was having some slurring of his words as well as right sided weakness of his right upper/lower extremity. He noticed it was worsening since it's onset. He denied any other neurological symptoms. He endorsed compliance with all of his medications, including his xarelto.   Pertinent neurological findings of right sided weakness 4/5. Slight right sided facial droop. No other facial asymmetry noted. All other cranial nerves intact. Gait evaluation, patient with difficulty balancing when turning. He was in a regular rate/rhythm.   His symptoms are consistent with acute CVA. Because the patient was symptomatic 48 hours prior to his presentation, he is not a candidate for acute intervention at this time. His timing as well as negative prior CVA work-up, will attempt to work up new CVA on outpatient basis. CT w/o contrast unable to be performed this evening as schedulers have already left. Plan for CT head w/o C tomorrow. Will ask patient to hold xarelto until we rule out hemorrhagic etiology. Continue on home bp meds and start amlodipine 5 mg in setting of acute hypertension   Recent A1c of 9.9, Lipid panel of chol 196 and LDL of 103. He endorses compliance with statin and metformin. Etiology possibly secondary to his uncontrolled hypertension.   Plan: - CT head w/o C tomorrow - hold xarelto until CT scan results - start amlodipine 5 mg  - continue PT/OT - continue DM/HLD regimen - continue to follow up neurology

## 2020-11-19 NOTE — Assessment & Plan Note (Signed)
Assessment: Potassium of 6.3 from 5.9. Patient called and instructed to return to clinic for further evaluation once labs from yesterday resulted. Patient had broadened T waves on EKG. Patient endorses compliance with his spironolactone.   Patient to be admitted to IMTS service in setting of severe hyperkalemia with evidence of cardiac involvement per EKG.   Plan: -admit to IMTS for acute lowering -d/c spiro -10 g lokelma given in [facility]

## 2020-11-19 NOTE — Progress Notes (Signed)
  Echocardiogram 2D Echocardiogram has been performed.  Delcie Roch 11/19/2020, 4:56 PM

## 2020-11-19 NOTE — Progress Notes (Signed)
CC: Hyperkalemia  HPI:  [name] is a 49 y.o. male with a past medical history stated below and presents today for hyperkalemia. Please see problem based assessment and plan for additional details.  Past Medical History:  Diagnosis Date  . Diabetes mellitus   . GERD (gastroesophageal reflux disease)   . History of alcohol abuse   . History of cocaine use   . History of echocardiogram    a. Echo 4/14: Moderate LVH, vigorous LVEF, EF 65-70%, normal wall motion, grade 2 diastolic dysfunction, mildly dilated aortic root and ascending aorta, ascending aorta 40 mm, aortic root 38 mm, mild LAE  . Hx of cardiovascular stress test    a. GXT 5/14: No ischemic changes  //  b. ETT-Myoview 3/16:  Low risk, no ischemia, EF 58%  . Hyperlipidemia   . Hypertension   . Morbid obesity (HCC)   . Paroxysmal atrial fibrillation (HCC)    Occurring in 2008, with several recurrence since then (including in the setting of + cocaine on UDS).  . Sleep apnea    uses CPAP  . SVT (supraventricular tachycardia) (HCC)    mid to long RP SVT 09/2019    No current facility-administered medications on file prior to visit.   Current Outpatient Medications on File Prior to Visit  Medication Sig Dispense Refill  . amLODipine (NORVASC) 5 MG tablet Take 1 tablet (5 mg total) by mouth daily. 30 tablet 11  . amphetamine-dextroamphetamine (ADDERALL XR) 30 MG 24 hr capsule Take 1 capsule (30 mg total) by mouth daily as needed (focus). 1 daily as needed 30 capsule 0  . atorvastatin (LIPITOR) 40 MG tablet Take 1 tablet (40 mg total) by mouth daily. 30 tablet 1  . Blood Pressure Monitoring (BLOOD PRESSURE MONITOR/L CUFF) MISC 1 Units by Does not apply route daily. 1 each 0  . diltiazem (CARDIZEM) 30 MG tablet Take 1 Tablet Every 4 Hours As Needed For HR >100 (Patient taking differently: Take 30 mg by mouth every 4 (four) hours as needed. For HR >100) 45 tablet 2  . Dulaglutide (TRULICITY) 0.75 MG/0.5ML  SOPN Inject 0.75 mg into the skin once a week. 2 mL 5  . flecainide (TAMBOCOR) 100 MG tablet Take 1 tablet (100 mg total) by mouth 2 (two) times daily. 180 tablet 3  . glipiZIDE (GLUCOTROL) 10 MG tablet Take 10 mg by mouth 2 (two) times daily.    Marland Kitchen lisinopril (ZESTRIL) 40 MG tablet Take 40 mg by mouth daily.    . metFORMIN (GLUCOPHAGE) 500 MG tablet Take 1,000 mg by mouth 2 (two) times daily with a meal.     . metoprolol succinate (TOPROL XL) 100 MG 24 hr tablet Take 1 tablet (100 mg total) by mouth daily. Take with or immediately following a meal. 30 tablet 11  . ondansetron (ZOFRAN ODT) 8 MG disintegrating tablet Take 1 tablet (8 mg total) by mouth every 8 (eight) hours as needed for nausea or vomiting. 12 tablet 0  . pantoprazole (PROTONIX) 40 MG tablet Take 1 tablet (40 mg total) by mouth daily. 30 tablet 5  . spironolactone (ALDACTONE) 25 MG tablet Take 25 mg by mouth daily.    Carlena Hurl 20 MG TABS tablet TAKE 1 TABLET (20 MG TOTAL) BY MOUTH DAILY WITH SUPPER. 30 tablet 6    Family History  Problem Relation Age of Onset  . Diabetes Mother   . Heart attack Father 64  . Stroke Maternal Grandmother   . Stroke  Paternal Grandmother   . Diabetes Paternal Grandfather     Social History   Socioeconomic History  . Marital status: Single    Spouse name: Not on file  . Number of children: 0  . Years of education: 14  . Highest education level: Not on file  Occupational History  . Occupation: Designer, industrial/product: UNEMPLOYED  Tobacco Use  . Smoking status: Current Every Day Smoker    Packs/day: 0.50    Years: 28.00    Pack years: 14.00    Types: Cigarettes  . Smokeless tobacco: Former Neurosurgeon    Types: Snuff  Vaping Use  . Vaping Use: Never used  Substance and Sexual Activity  . Alcohol use: Not Currently    Alcohol/week: 6.0 standard drinks    Types: 6 Standard drinks or equivalent per week    Comment: monthly  . Drug use: Not Currently    Comment: cocaine in the past, none  currently  . Sexual activity: Not on file  Other Topics Concern  . Not on file  Social History Narrative   Fun: Photographer       Lives in [place] with sister.      Unemployed, was working a Office manager job   Social Determinants of Community education officer: Not on file  Food Insecurity: Not on file  Transportation Needs: Not on file  Physical Activity: Not on file  Stress: Not on file  Social Connections: Not on file  Intimate Partner Violence: Not on file    Review of Systems: ROS negative except for what is noted on the assessment and plan.  Vitals:   11/19/20 1024 11/19/20 1304  BP: (!) 154/80 (!) 143/91  Pulse: 63 68  Temp: 98.4 F (36.9 C) 97.7 F (36.5 C)  TempSrc: Oral Oral  SpO2: 97% 100%   Physical Exam: Constitutional: well-appearing, in no acute distress HENT: normocephalic atraumatic Eyes: conjunctiva non-erythematous. PERRL. Neck: supple Cardiovascular: regular rate and rhythm, no m/r/g Pulmonary/Chest: normal work of breathing on room air, lungs clear to auscultation bilaterally Abdominal: soft, non-tender, non-distended MSK: normal bulk and tone Neurological: alert & oriented x 3, slight right sided facial droop, no other facial asymmetry noted. Normal swallowing function. No uvular deviation. No tongue fasciculations. Finger to nose and heel to shin unremarkable. RUE strength 4/5. LUE strength 5/5. RLE strength 4/5. LLE strength 5/5. No sensory abnormalities.   Skin: warm and dry Psych: normal mood  Assessment & Plan:   See Encounters Tab for problem based charting.  Patient discussed with Dr. Mackey Birchwood, D.O. [facility], [facility], PGY-1 Pager: [phone], Phone: [phone] Date 11/19/2020 Time 3:31 PM

## 2020-11-19 NOTE — Assessment & Plan Note (Signed)
Assessment: Patient was scheduled for head CT w/o C today, however, with potassium of 6.3 patient to be admitted to IMTS for further evaluation. Patient to have further work-up during admission  Plan: -Admitted to IMTS, suspect will receive neurological evaluation while admitted including head CT.

## 2020-11-19 NOTE — Evaluation (Signed)
Physical Therapy Evaluation Patient Details Name: [name]. MRN: [id number] DOB: 03-Jun-1971 Today's Date: 11/19/2020   History of Present Illness  Pt is a 50 y/o male with PMH of diabetes, HTN, morbid obesity, history of polysubstance abuse (cocaine and alcohol), pt is a direct admit from outpatient due to R sided weakness and hyperkalemia.  Clinical Impression  Pt presents to PT with deficits in strength, power, gait, balance, functional mobility. Pt with R sided weakness but is still able to mobilize without physical assistance at this time. PT notes reduced gait speed, pt reports he feels the need to remain cautious and focus on mobility to reduce his falls risk. Pt will benefit from aggressive mobilization and acute PT POC to aide in a return to independence. PT recommends a return to outpatient PT services at the time of discharge.    Follow Up Recommendations Outpatient PT;Supervision - Intermittent    Equipment Recommendations  None recommended by PT    Recommendations for Other Services       Precautions / Restrictions Precautions Precautions: Fall Restrictions Weight Bearing Restrictions: No      Mobility  Bed Mobility               General bed mobility comments: pt received in standing and left sitting at edge of bed    Transfers Overall transfer level: Needs assistance Equipment used: None             General transfer comment: pt received in standing, performs stand to sit with supervision  Ambulation/Gait Ambulation/Gait assistance: Supervision Gait Distance (Feet): 120 Feet Assistive device: None Gait Pattern/deviations: Step-to pattern Gait velocity: reduced Gait velocity interpretation: <1.8 ft/sec, indicate of risk for recurrent falls General Gait Details: pt with slowed step-to gait, reduced step length and stance time of RLE  Stairs            Wheelchair Mobility    Modified Rankin (Stroke Patients Only) Modified  Rankin (Stroke Patients Only) Pre-Morbid Rankin Score: Moderate disability Modified Rankin: Moderately severe disability     Balance Overall balance assessment: Needs assistance Sitting-balance support: No upper extremity supported;Feet supported Sitting balance-Leahy Scale: Good     Standing balance support: No upper extremity supported;During functional activity Standing balance-Leahy Scale: Good Standing balance comment: brushing teeth with supervision                             Pertinent Vitals/Pain Pain Assessment: No/denies pain    Home Living Family/patient expects to be discharged to:: Private residence Living Arrangements: Other relatives (sister and nephew) Available Help at Discharge: Family;Available PRN/intermittently Type of Home: House Home Access: Level entry     Home Layout: One level Home Equipment: None      Prior Function Level of Independence: Needs assistance   Gait / Transfers Assistance Needed: independent household ambulator, exercises by walking down the street  ADL's / Homemaking Assistance Needed: sister assists with bathing, dressing. Sister does all cooking and cleaning  Comments: pt not driving, uses transport to get to therapies     Hand Dominance   Dominant Hand: Right    Extremity/Trunk Assessment   Upper Extremity Assessment Upper Extremity Assessment: RUE deficits/detail RUE Deficits / Details: grossly 4+/5    Lower Extremity Assessment Lower Extremity Assessment: RLE deficits/detail RLE Deficits / Details: grossly 4+/5    Cervical / Trunk Assessment Cervical / Trunk Assessment: Normal  Communication   Communication: No difficulties  Cognition  Arousal/Alertness: Awake/alert Behavior During Therapy: WFL for tasks assessed/performed Overall Cognitive Status: Within Functional Limits for tasks assessed                                        General Comments General comments (skin  integrity, edema, etc.): VSS on RA    Exercises     Assessment/Plan    PT Assessment Patient needs continued PT services  PT Problem List Decreased strength;Decreased activity tolerance;Decreased balance;Decreased mobility       PT Treatment Interventions DME instruction;Gait training;Functional mobility training;Therapeutic activities;Therapeutic exercise;Balance training;Neuromuscular re-education;Patient/family education    PT Goals (Current goals can be found in the Care Plan section)  Acute Rehab PT Goals Patient Stated Goal: to return to independent mobility PT Goal Formulation: With patient Time For Goal Achievement: 12/03/20 Potential to Achieve Goals: Good    Frequency Min 3X/week (may need to increase to 4x/week if CVA is identified)   Barriers to discharge        Co-evaluation               AM-PAC PT "6 Clicks" Mobility  Outcome Measure Help needed turning from your back to your side while in a flat bed without using bedrails?: A Little Help needed moving from lying on your back to sitting on the side of a flat bed without using bedrails?: A Little Help needed moving to and from a bed to a chair (including a wheelchair)?: A Little Help needed standing up from a chair using your arms (e.g., wheelchair or bedside chair)?: A Little Help needed to walk in hospital room?: A Little Help needed climbing 3-5 steps with a railing? : A Little 6 Click Score: 18    End of Session   Activity Tolerance: Patient tolerated treatment well Patient left: in bed;with call bell/phone within reach Nurse Communication: Mobility status PT Visit Diagnosis: Muscle weakness (generalized) (M62.81);Other abnormalities of gait and mobility (R26.89)    Time: 1459-1511 PT Time Calculation (min) (ACUTE ONLY): 12 min   Charges:   PT Evaluation $PT Eval Low Complexity: 1 Low          Arlyss Gandy, PT, DPT Acute Rehabilitation Pager: [phone]   Arlyss Gandy 11/19/2020, 3:32  PM

## 2020-11-19 NOTE — Progress Notes (Signed)
Pt. Alert and stable. No s/s of distress noted at this time. Pt. Back from MRI. Telemetry monitor on.

## 2020-11-20 DIAGNOSIS — I480 Paroxysmal atrial fibrillation: Secondary | ICD-10-CM | POA: Diagnosis not present

## 2020-11-20 DIAGNOSIS — N179 Acute kidney failure, unspecified: Secondary | ICD-10-CM | POA: Diagnosis not present

## 2020-11-20 DIAGNOSIS — E875 Hyperkalemia: Secondary | ICD-10-CM | POA: Diagnosis not present

## 2020-11-20 DIAGNOSIS — R471 Dysarthria and anarthria: Secondary | ICD-10-CM | POA: Diagnosis not present

## 2020-11-20 DIAGNOSIS — I6330 Cerebral infarction due to thrombosis of unspecified cerebral artery: Secondary | ICD-10-CM | POA: Diagnosis present

## 2020-11-20 DIAGNOSIS — I639 Cerebral infarction, unspecified: Secondary | ICD-10-CM | POA: Diagnosis not present

## 2020-11-20 DIAGNOSIS — R531 Weakness: Secondary | ICD-10-CM | POA: Diagnosis not present

## 2020-11-20 LAB — CBC WITH DIFFERENTIAL/PLATELET
Abs Immature Granulocytes: 0.03 10*3/uL (ref 0.00–0.07)
Basophils Absolute: 0 10*3/uL (ref 0.0–0.1)
Basophils Relative: 0 %
Eosinophils Absolute: 0.6 10*3/uL — ABNORMAL HIGH (ref 0.0–0.5)
Eosinophils Relative: 8 %
HCT: 37.3 % — ABNORMAL LOW (ref 39.0–52.0)
Hemoglobin: 12.3 g/dL — ABNORMAL LOW (ref 13.0–17.0)
Immature Granulocytes: 0 %
Lymphocytes Relative: 24 %
Lymphs Abs: 2 10*3/uL (ref 0.7–4.0)
MCH: 28 pg (ref 26.0–34.0)
MCHC: 33 g/dL (ref 30.0–36.0)
MCV: 85 fL (ref 80.0–100.0)
Monocytes Absolute: 0.9 10*3/uL (ref 0.1–1.0)
Monocytes Relative: 11 %
Neutro Abs: 4.5 10*3/uL (ref 1.7–7.7)
Neutrophils Relative %: 57 %
Platelets: 250 10*3/uL (ref 150–400)
RBC: 4.39 MIL/uL (ref 4.22–5.81)
RDW: 13.8 % (ref 11.5–15.5)
WBC: 8.1 10*3/uL (ref 4.0–10.5)
nRBC: 0 % (ref 0.0–0.2)

## 2020-11-20 LAB — RAPID URINE DRUG SCREEN, HOSP PERFORMED
Amphetamines: NOT DETECTED
Barbiturates: NOT DETECTED
Benzodiazepines: NOT DETECTED
Cocaine: NOT DETECTED
Opiates: NOT DETECTED
Tetrahydrocannabinol: NOT DETECTED

## 2020-11-20 LAB — GLUCOSE, CAPILLARY
Glucose-Capillary: 143 mg/dL — ABNORMAL HIGH (ref 70–99)
Glucose-Capillary: 197 mg/dL — ABNORMAL HIGH (ref 70–99)
Glucose-Capillary: 184 mg/dL — ABNORMAL HIGH (ref 70–99)
Glucose-Capillary: 159 mg/dL — ABNORMAL HIGH (ref 70–99)

## 2020-11-20 LAB — BASIC METABOLIC PANEL
Anion gap: 7 (ref 5–15)
BUN: 26 mg/dL — ABNORMAL HIGH (ref 6–20)
CO2: 22 mmol/L (ref 22–32)
Calcium: 8.8 mg/dL — ABNORMAL LOW (ref 8.9–10.3)
Chloride: 108 mmol/L (ref 98–111)
Creatinine, Ser: 1.26 mg/dL — ABNORMAL HIGH (ref 0.61–1.24)
GFR, Estimated: >60 mL/min (ref >60)
Glucose, Bld: 148 mg/dL — ABNORMAL HIGH (ref 70–99)
Potassium: 5.6 mmol/L — ABNORMAL HIGH (ref 3.5–5.1)
Sodium: 137 mmol/L (ref 135–145)

## 2020-11-20 MED ORDER — RAMELTEON 8 MG PO TABS
8.0000 mg | ORAL_TABLET | Freq: Every day | ORAL | Status: DC
  Administered 2020-11-21: 8 mg via ORAL
  Filled 2020-11-20 (×3): qty 1

## 2020-11-20 MED ORDER — STROKE: EARLY STAGES OF RECOVERY BOOK
Freq: Once | Status: DC
  Filled 2020-11-20 (×2): qty 1

## 2020-11-20 MED ORDER — INSULIN ASPART 100 UNIT/ML ~~LOC~~ SOLN
0.0000 [IU] | Freq: Three times a day (TID) | SUBCUTANEOUS | Status: DC
  Administered 2020-11-20: 3 [IU] via SUBCUTANEOUS
  Administered 2020-11-21 (×2): 7 [IU] via SUBCUTANEOUS

## 2020-11-20 MED ORDER — APIXABAN 5 MG PO TABS
5.0000 mg | ORAL_TABLET | Freq: Two times a day (BID) | ORAL | Status: DC
  Administered 2020-11-21 (×2): 5 mg via ORAL
  Filled 2020-11-20 (×2): qty 1

## 2020-11-20 MED ORDER — ASPIRIN EC 81 MG PO TBEC
81.0000 mg | DELAYED_RELEASE_TABLET | Freq: Every day | ORAL | Status: DC
  Administered 2020-11-20: 81 mg via ORAL
  Filled 2020-11-20: qty 1

## 2020-11-20 MED ORDER — SODIUM ZIRCONIUM CYCLOSILICATE 10 G PO PACK
10.0000 g | PACK | Freq: Two times a day (BID) | ORAL | Status: DC
  Administered 2020-11-20 – 2020-11-21 (×3): 10 g via ORAL
  Filled 2020-11-20 (×3): qty 1

## 2020-11-20 NOTE — Progress Notes (Deleted)
ANTICOAGULATION CONSULT NOTE - Initial Consult  Pharmacy Consult for apixaban Indication: stroke  Allergies  Allergen Reactions  . Shrimp [Shellfish Allergy] Anaphylaxis  . Banana Other (See Comments)    Pt gags  . Watermelon Flavor Nausea And Vomiting  . Other Itching    Grapes cause itching  . Almond Oil Itching    Roof of mouth itches  . Peanut-Containing Drug Products Itching and Cough  . Sulfa Antibiotics Itching    Patient Measurements: Height: 5\' 9"  (175.3 cm) Weight: (!) 139.2 kg (306 lb 12.8 oz) IBW/kg (Calculated) : 70.7  Vital Signs: Temp: 98.6 F (37 C) (02/05 1134) Temp Source: Oral (02/05 1134) BP: 133/85 (02/05 1134) Pulse Rate: 66 (02/05 1134)  Labs: Recent Labs    11/19/20 1439 11/19/20 2156 11/20/20 0255 11/20/20 0746  HGB  --   --  12.3*  --   HCT  --   --  37.3*  --   PLT  --   --  250  --   CREATININE 1.53* 1.35*  --  1.26*    Estimated Creatinine Clearance: 97.3 mL/min (A) (by C-G formula based on SCr of 1.26 mg/dL (H)).   Medical History: Past Medical History:  Diagnosis Date  . Diabetes mellitus   . GERD (gastroesophageal reflux disease)   . History of alcohol abuse   . History of cocaine use   . History of echocardiogram    a. Echo 4/14: Moderate LVH, vigorous LVEF, EF 65-70%, normal wall motion, grade 2 diastolic dysfunction, mildly dilated aortic root and ascending aorta, ascending aorta 40 mm, aortic root 38 mm, mild LAE  . Hx of cardiovascular stress test    a. GXT 5/14: No ischemic changes  //  b. ETT-Myoview 3/16:  Low risk, no ischemia, EF 58%  . Hyperlipidemia   . Hypertension   . Morbid obesity (HCC)   . Paroxysmal atrial fibrillation (HCC)    Occurring in 2008, with several recurrence since then (including in the setting of + cocaine on UDS).  . Sleep apnea    uses CPAP  . SVT (supraventricular tachycardia) (HCC)    mid to long RP SVT 09/2019    Medications:  Scheduled:  .  stroke: mapping our early stages of  recovery book   Does not apply Once  . aspirin EC  81 mg Oral Daily  . atorvastatin  80 mg Oral Daily  . flecainide  100 mg Oral BID  . insulin aspart  0-20 Units Subcutaneous TID WC  . insulin aspart  0-5 Units Subcutaneous QHS  . metoprolol succinate  100 mg Oral Q breakfast  . pantoprazole  40 mg Oral Daily  . ramelteon  8 mg Oral QHS  . rivaroxaban  20 mg Oral Q supper  . sodium chloride flush  3 mL Intravenous Q12H  . sodium zirconium cyclosilicate  10 g Oral BID    Assessment: 50yoM presenting with stroke symptoms (r-sided weakness). MRI revealed acute infarctions and old ischemic changes throughout the brain, likely microembolic disease. Pt currently seen by neuro outpatient for prior CVAs as well and is on rivaroxaban PTA. Pharmacy has been consulted to switch rivaroxaban to apixaban.   CBC stable, no s/sx bleeding. Last dose of rivaroxaban was 2/4.   Goal of Therapy:  Monitor platelets by anticoagulation protocol: Yes   Plan:   Discontinue rivaroxaban Initiate apixaban 5mg  twice daily  Follow CBC daily, s/sx bleeding  10/2019, PharmD PGY1 Acute Care Pharmacy Resident Please refer to [facility]  for unit-specific pharmacist

## 2020-11-20 NOTE — Evaluation (Signed)
Occupational Therapy Evaluation Patient Details Name: [name]. MRN: [id number] DOB: 07/18/1971 Today's Date: 11/20/2020    History of Present Illness Pt is a 50 y/o male with PMH of diabetes, HTN, morbid obesity, history of polysubstance abuse (cocaine and alcohol), pt is a direct admit from outpatient due to R sided weakness and hyperkalemia.   Clinical Impression   Patient admitted for the diagnosis above.  Unfortunately this is his 3rd stroke that he is aware of.  Deficits include L visual field deficit, slight R shoulder weakness, mild R coordination deficits, and dynamic stand balance deficits.  Unclear if these are new, or an extension from prior CVA's.  He was participating with outpatient therapies, and plans on continuing those.  OT will continue to see him in the acute setting to maximize his functional status prior to returning home.      Follow Up Recommendations  Other (comment) (continue outpatient as before)    Equipment Recommendations  None recommended by OT    Recommendations for Other Services       Precautions / Restrictions Precautions Precautions: Fall Restrictions Weight Bearing Restrictions: No      Mobility Bed Mobility Overal bed mobility: Modified Independent                  Transfers Overall transfer level: Needs assistance   Transfers: Sit to/from Stand Sit to Stand: Supervision         General transfer comment: supervision and occasional min guard with in room mobility.  Knee buckle and slight LOB noted R side.    Balance Overall balance assessment: Mild deficits observed, not formally tested                                         ADL either performed or assessed with clinical judgement   ADL Overall ADL's : At baseline                                       General ADL Comments: most likely at his baseline - family assists as needed for basic ADL.  Family does all meal prep  and home mangement.     Vision Patient Visual Report: No change from baseline Vision Assessment?: Yes Ocular Range of Motion: Within Functional Limits Visual Fields: Left homonymous hemianopsia Additional Comments: maybe missing about 15 degrees on L visual field, but not impacting him functioonally.  Not running into objects on L side.     Perception     Praxis      Pertinent Vitals/Pain Pain Assessment: No/denies pain     Hand Dominance Right   Extremity/Trunk Assessment Upper Extremity Assessment Upper Extremity Assessment: RUE deficits/detail RUE Deficits / Details: noted decreased MMT shoulder flexion to R compared to L.  Slight coordination deficit with R UE - not as smooth and overshooting noted - does not really impact his independence. RUE Sensation: WNL RUE Coordination: WNL   Lower Extremity Assessment Lower Extremity Assessment: Defer to PT evaluation       Communication Communication Communication: No difficulties;Other (comment) (slurred)   Cognition Arousal/Alertness: Awake/alert Behavior During Therapy: WFL for tasks assessed/performed Overall Cognitive Status: Within Functional Limits for tasks assessed  General Comments       Exercises     Shoulder Instructions      Home Living Family/patient expects to be discharged to:: Private residence Living Arrangements: Other relatives Available Help at Discharge: Family;Available PRN/intermittently Type of Home: House Home Access: Level entry     Home Layout: One level     Bathroom Shower/Tub: Chief Strategy Officer: Standard     Home Equipment: None          Prior Functioning/Environment Level of Independence: Needs assistance  Gait / Transfers Assistance Needed: independent household ambulator, exercises by walking down the street ADL's / Homemaking Assistance Needed: sister assists with bathing, dressing. Sister does all  cooking and cleaning   Comments: pt not driving, uses transport to get to therapies        OT Problem List: Decreased strength;Impaired balance (sitting and/or standing)      OT Treatment/Interventions: Self-care/ADL training;Therapeutic exercise;Therapeutic activities;Balance training    OT Goals(Current goals can be found in the care plan section) Acute Rehab OT Goals Patient Stated Goal: be able to walk better and do more for myself OT Goal Formulation: With patient Time For Goal Achievement: 12/04/20 Potential to Achieve Goals: Good ADL Goals Pt Will Perform Lower Body Bathing: Independently Pt Will Perform Lower Body Dressing: Independently Pt Will Transfer to Toilet: Independently Pt Will Perform Toileting - Clothing Manipulation and hygiene: Independently  OT Frequency: Min 2X/week   Barriers to D/C:    none noted       Co-evaluation              AM-PAC OT "6 Clicks" Daily Activity     Outcome Measure Help from another person eating meals?: None Help from another person taking care of personal grooming?: A Little Help from another person toileting, which includes using toliet, bedpan, or urinal?: A Little Help from another person bathing (including washing, rinsing, drying)?: A Little Help from another person to put on and taking off regular upper body clothing?: None Help from another person to put on and taking off regular lower body clothing?: A Little 6 Click Score: 20   End of Session Nurse Communication: Mobility status  Activity Tolerance: Patient tolerated treatment well Patient left: in chair;with call bell/phone within reach  OT Visit Diagnosis: Unsteadiness on feet (R26.81);Muscle weakness (generalized) (M62.81);Ataxia, unspecified (R27.0);Hemiplegia and hemiparesis Hemiplegia - Right/Left: Right Hemiplegia - dominant/non-dominant: Dominant Hemiplegia - caused by: Cerebral infarction                Time: 0912-0925 OT Time Calculation (min):  13 min Charges:  OT General Charges $OT Visit: 1 Visit OT Evaluation $OT Eval Moderate Complexity: 1 Mod  11/20/2020  Rich, OTR/L  Acute Rehabilitation Services  Office:  [phone]   Suzanna Obey 11/20/2020, 9:40 AM

## 2020-11-20 NOTE — Progress Notes (Signed)
ANTICOAGULATION CONSULT NOTE - Initial Consult  Pharmacy Consult for apixaban Indication: stroke  Allergies  Allergen Reactions  . Shrimp [Shellfish Allergy] Anaphylaxis  . Banana Other (See Comments)    Pt gags  . Watermelon Flavor Nausea And Vomiting  . Other Itching    Grapes cause itching  . Almond Oil Itching    Roof of mouth itches  . Peanut-Containing Drug Products Itching and Cough  . Sulfa Antibiotics Itching    Patient Measurements: Height: 5\' 9"  (175.3 cm) Weight: (!) 139.2 kg (306 lb 12.8 oz) IBW/kg (Calculated) : 70.7  Vital Signs: Temp: 98.6 F (37 C) (02/05 1134) Temp Source: Oral (02/05 1134) BP: 133/85 (02/05 1134) Pulse Rate: 66 (02/05 1134)  Labs: Recent Labs    11/19/20 1439 11/19/20 2156 11/20/20 0255 11/20/20 0746  HGB  --   --  12.3*  --   HCT  --   --  37.3*  --   PLT  --   --  250  --   CREATININE 1.53* 1.35*  --  1.26*    Estimated Creatinine Clearance: 97.3 mL/min (A) (by C-G formula based on SCr of 1.26 mg/dL (H)).   Medical History: Past Medical History:  Diagnosis Date  . Diabetes mellitus   . GERD (gastroesophageal reflux disease)   . History of alcohol abuse   . History of cocaine use   . History of echocardiogram    a. Echo 4/14: Moderate LVH, vigorous LVEF, EF 65-70%, normal wall motion, grade 2 diastolic dysfunction, mildly dilated aortic root and ascending aorta, ascending aorta 40 mm, aortic root 38 mm, mild LAE  . Hx of cardiovascular stress test    a. GXT 5/14: No ischemic changes  //  b. ETT-Myoview 3/16:  Low risk, no ischemia, EF 58%  . Hyperlipidemia   . Hypertension   . Morbid obesity (HCC)   . Paroxysmal atrial fibrillation (HCC)    Occurring in 2008, with several recurrence since then (including in the setting of + cocaine on UDS).  . Sleep apnea    uses CPAP  . SVT (supraventricular tachycardia) (HCC)    mid to long RP SVT 09/2019    Medications:  Scheduled:  .  stroke: mapping our early stages of  recovery book   Does not apply Once  . apixaban  5 mg Oral BID  . aspirin EC  81 mg Oral Daily  . atorvastatin  80 mg Oral Daily  . flecainide  100 mg Oral BID  . insulin aspart  0-20 Units Subcutaneous TID WC  . insulin aspart  0-5 Units Subcutaneous QHS  . metoprolol succinate  100 mg Oral Q breakfast  . pantoprazole  40 mg Oral Daily  . ramelteon  8 mg Oral QHS  . sodium chloride flush  3 mL Intravenous Q12H  . sodium zirconium cyclosilicate  10 g Oral BID    Assessment: 50yoM presenting with stroke symptoms (r-sided weakness). MRI revealed acute infarctions and old ischemic changes throughout the brain, likely microembolic disease. Pt currently seen by neuro outpatient for prior CVAs as well and is on rivaroxaban PTA. Pharmacy has been consulted to switch rivaroxaban to apixaban.   CBC stable, no s/sx bleeding. Last dose of rivaroxaban was 2/5 around 0200.   Goal of Therapy:  Monitor platelets by anticoagulation protocol: Yes   Plan:   Discontinue rivaroxaban Initiate apixaban 5mg  twice daily  Follow CBC daily, s/sx bleeding  10/2019, PharmD PGY1 Acute Care Pharmacy Resident Please refer to  [facility] for unit-specific pharmacist

## 2020-11-20 NOTE — Progress Notes (Signed)
STROKE TEAM PROGRESS NOTE   INTERVAL HISTORY No family is at the bedside.  Pt lying in bed, sleepy but arousable and cooperative on exam. He stated compliance with Xarelto. He still has slight right UE and LE weakness.    OBJECTIVE Vitals:   11/20/20 0027 11/20/20 0325 11/20/20 0728 11/20/20 1134  BP: (!) 158/84 (!) 168/91 (!) 174/98 133/85  Pulse: 68 72 73 66  Resp: 20 16 20 20   Temp: (!) 97.5 F (36.4 C) (!) 97.5 F (36.4 C) 98.2 F (36.8 C) 98.6 F (37 C)  TempSrc: Oral Oral Oral Oral  SpO2: 96% 96% 97% 96%  Weight:  (!) 139.2 kg    Height:        CBC:  Recent Labs  Lab 11/20/20 0255  WBC 8.1  NEUTROABS 4.5  HGB 12.3*  HCT 37.3*  MCV 85.0  PLT 250    Basic Metabolic Panel:  Recent Labs  Lab 11/19/20 1439 11/19/20 2156 11/20/20 0746  NA 138 135 137  K 5.7* 5.3* 5.6*  CL 110 108 108  CO2 20* 22 22  GLUCOSE 183* 152* 148*  BUN 34* 31* 26*  CREATININE 1.53* 1.35* 1.26*  CALCIUM 8.6* 8.4* 8.8*  MG 1.9  --   --     Lipid Panel:     Component Value Date/Time   CHOL 184 11/19/2020 1439   TRIG 274 (H) 11/19/2020 1439   HDL 27 (L) 11/19/2020 1439   CHOLHDL 6.8 11/19/2020 1439   VLDL 55 (H) 11/19/2020 1439   LDLCALC 102 (H) 11/19/2020 1439   HgbA1c:  Lab Results  Component Value Date   HGBA1C 7.7 (H) 11/19/2020   Urine Drug Screen:     Component Value Date/Time   LABOPIA NONE DETECTED 11/20/2020 0157   COCAINSCRNUR NONE DETECTED 11/20/2020 0157   COCAINSCRNUR NEGATIVE 01/20/2013 1112   LABBENZ NONE DETECTED 11/20/2020 0157   LABBENZ NEGATIVE 01/20/2013 1112   AMPHETMU NONE DETECTED 11/20/2020 0157   THCU NONE DETECTED 11/20/2020 0157   LABBARB NONE DETECTED 11/20/2020 0157    Alcohol Level     Component Value Date/Time   ETH <10 07/08/2019 0814    IMAGING  MR BRAIN WO CONTRAST MR ANGIO HEAD WO CONTRAST  MR ANGIO NECK WO CONTRAST 11/19/2020 IMPRESSION:  Acute infarction in the left pons. Second acute infarction in the right lateral  splenium of the corpus callosum. Third small focus of acute infarction in the medial left temporal lobe/hippocampus. Given the other old ischemic changes throughout the brain, these are likely due to micro embolic disease within the posterior circulation.   MRA HEAD: No large or medium vessel occlusion or correctable proximal stenosis. Some atherosclerotic irregularity in the siphon regions but no flow limiting stenosis.   MRA NECK: No abnormality seen in the region visualized. Vertebral artery origins not visible due to technical factors inherent to noncontrast technique.   ECHOCARDIOGRAM COMPLETE 11/19/2020 IMPRESSIONS   1. Left ventricular ejection fraction, by estimation, is 60 to 65%. The left ventricle has normal function. The left ventricle has no regional wall motion abnormalities. Left ventricular diastolic parameters are consistent with Grade I diastolic dysfunction (impaired relaxation).   2. Right ventricular systolic function is normal. The right ventricular size is normal.   3. Left atrial size was mildly dilated.   4. The mitral valve is normal in structure. Mild mitral valve regurgitation. No evidence of mitral stenosis. Moderate mitral annular calcification.   5. The aortic valve is normal in structure. Aortic valve  regurgitation is not visualized. No aortic stenosis is present.   6. Aortic dilatation noted. There is mild dilatation of the ascending aorta, measuring 39 mm.   7. The inferior vena cava is normal in size with greater than 50% respiratory variability, suggesting right atrial pressure of 3 mmHg. Comparison(s): No significant change from prior study. Prior images reviewed side by side. Conclusion(s)/Recommendation(s): No intracardiac source of embolism detected on this transthoracic study. A transesophageal echocardiogram is recommended to exclude cardiac source of embolism if clinically indicated.   VAS US CAROTID 11/19/2020 Summary:  Right Carotid: There was no evidence  of thrombus, dissection, atherosclerotic plaque or stenosis in the cervical carotid system.  Left Carotid: There was no evidence of thrombus, dissection, atherosclerotic plaque or stenosis in the cervical carotid system.  Vertebrals:  Bilateral vertebral arteries demonstrate antegrade flow.  Subclavians: Normal flow hemodynamics were seen in bilateral subclavian arteries.   Final     ECG - SR rate 66 BPM. (See cardiology reading for complete details)   PHYSICAL EXAM  Temp:  [97.4 F (36.3 C)-98.6 F (37 C)] 98.6 F (37 C) (02/05 1134) Pulse Rate:  [66-73] 66 (02/05 1134) Resp:  [16-20] 20 (02/05 1134) BP: (133-174)/(84-98) 133/85 (02/05 1134) SpO2:  [96 %-99 %] 96 % (02/05 1134) Weight:  [139.2 kg] 139.2 kg (02/05 0325)  General - Well nourished, well developed, in no apparent distress, mildly sleepy.  Ophthalmologic - fundi not visualized due to noncooperation.  Cardiovascular - Regular rhythm and rate.  Mental Status -  Level of arousal and orientation to time, place, and person were intact. Language including expression, naming, repetition, comprehension was assessed and found intact.  Cranial Nerves II - XII - II - Visual field intact OU. III, IV, VI - Extraocular movements intact. V - Facial sensation intact bilaterally. VII - right facial droop. VIII - Hearing & vestibular intact bilaterally. X - Palate elevates symmetrically. XI - Chin turning & shoulder shrug intact bilaterally. XII - Tongue protrusion intact.  Motor Strength - The patient's strength was normal in left upper and lower extremities however right UE mild pronator drift and 4+/5 finger grip, right LE DF/PF 4+/5.  Bulk was normal and fasciculations were absent.   Motor Tone - Muscle tone was assessed at the neck and appendages and was normal.  Reflexes - The patient's reflexes were symmetrical in all extremities and he had no pathological reflexes.  Sensory - Light touch, temperature/pinprick were  assessed and were symmetrical.    Coordination - The patient had normal movements in the left hand with no ataxia or dysmetria. However, mild ataxia right FTN not out of proportion to weakness. Tremor was absent.  Gait and Station - deferred.   ASSESSMENT/PLAN [name]. is a 50 y.o. male with history significant for smoking,morbid obesity,OSA, DM2, HTN, HLD, substance use (cocaine), pAF s/p Ablationon Xarelto and dCHFwho p/w 4 day hx of R arm and leg weakness with dysarthria. He went to bed on 11/14/29 and woke up the next day with these symptoms. 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  He did not receive IV t-PA due to late presentation (>4.5 hours from time of onset).  Stroke: multiple infarcts with a left pontine, R lateral splenium of corpus callosum infarct, medial left temporal lobe/hippocampus infarct - likely embolic due to PAF even on Xarelto  MRI head - Acute infarction in the left  pons. Second acute infarction in the right lateral splenium of the corpus callosum. Third small focus of acute infarction in the medial left temporal lobe/hippocampus.   MRA H&N - No large or medium vessel occlusion or correctable proximal stenosis. Some atherosclerotic irregularity in the siphon regions but no flow limiting stenosis.   Carotid Doppler - unremarkable  2D Echo - EF 60 - 65%. No cardiac source of emboli identified.   Sars Corona Virus 2 - negative  LDL - 102  HgbA1c - 7.7  UDS - negative  VTE prophylaxis - Xarelto -> Eliquis  Xarelto (rivaroxaban) daily prior to admission, now Xarelto switched to Eliquis.  Primary team also put patient on aspirin 81.  Patient will be counseled to be compliant with his antithrombotic medications  Ongoing aggressive stroke risk factor management  Therapy recommendations:  Outpt PT   Disposition:   Pending  PAF  Status post ablation  Rate controlled  On Xarelto PTA  Pt stated compliance with Xarelto  Given Xarelto failure, discussed with pt and he agrees to switch to eliquis  History of stroke  05/2020 admitted for small left carotid infarct.  MRA head and neck unremarkable.  EF 60 to 65%.  LDL 103 A1c 9.9, UDS negative.  He was not compliant with Xarelto at that time.  His Xarelto resumed and discharged with Lipitor 40 too.   Hypertension  Home BP meds: metoprolol ; cardizem ; Zestril ; Aldactone ; Norvasc  Current BP meds: metoprolol  Stable . Long-term BP goal normotensive  Hyperlipidemia  Home Lipid lowering medication: Lipitor 40 mg daily  LDL 102, goal < 70  Current lipid lowering medication: Lipitor 80 mg daily  Continue statin at discharge  Diabetes, uncontrolled  Home diabetic meds: metformin ; Trulicity ; Glucotrol  HgbA1c 7.7, goal < 7.0  SSI  CBG  Close PCP follow-up for better DM control  Tobacco abuse  Current smoker  Smoking cessation counseling provided  Pt is willing to quit  Other Stroke Risk Factors  Cigarette smoker - advised to stop smoking  Previous ETOH use  Morbid obesity, Body mass index is 45.31 kg/m., recommend weight loss, diet and exercise as appropriate   Family hx stroke (maternal and paternal grandmothers)   Obstructive sleep apnea, on CPAP at home  Congestive Heart Failure  Previous substance abuse  Other Active Problems, Findings, Recommendations and/or Plan Code status - Full code   CKD - stage 3a - creatinine - 1.53->1.35->1.26  Hyperkalemia - potassium - 5.7->5.3->5.6   Hospital day # 0  Neurology will sign off. Please call with questions. Pt will follow up with stroke clinic NP at [facility] on 12/14/20. Thanks for the consult.  Marvel Plan, MD PhD Stroke Neurology 11/20/2020 5:54 PM     To contact Stroke Continuity provider, please refer to [email or web address]. After hours, contact General Neurology

## 2020-11-20 NOTE — Progress Notes (Signed)
NAME:  Douglas Stthomas., MRN:  [id number], DOB:  1971-04-15, LOS: 0 ADMISSION DATE:  11/19/2020  Subjective  Patient evaluated at bedside this AM. States he is doing okay, just frustrated with another stroke after having one recently in August. Eating well, states he hopes he can get up to go to the bathroom alone soon.  Objective   Blood pressure (!) 168/91, pulse 72, temperature (!) 97.5 F (36.4 C), temperature source Oral, resp. rate 16, height 5\' 9"  (1.753 m), weight (!) 139.2 kg, SpO2 96 %.     Intake/Output Summary (Last 24 hours) at 11/20/2020 01/18/2021 Last data filed at 11/20/2020 0149 Gross per 24 hour  Intake 660 ml  Output 675 ml  Net -15 ml   Filed Weights   11/20/20 0325  Weight: (!) 139.2 kg   Physical Exam: General: Laying in bed, resting comfortably, no acute distress CV: Regular rate, rhythm. No m/r/g Neuro: Awake, alert. Moving all four extremities. Intermittently dysarthric.  Labs    CBC Latest Ref Rng & Units 11/20/2020 06/01/2020 06/01/2020  WBC 4.0 - 10.5 K/uL 8.1 - 7.3  Hemoglobin 13.0 - 17.0 g/dL 12.3(L) 13.3 13.0  Hematocrit 39.0 - 52.0 % 37.3(L) 39.0 40.4  Platelets 150 - 400 K/uL 250 - 315   BMP Latest Ref Rng & Units 11/19/2020 11/19/2020 11/19/2020  Glucose 70 - 99 mg/dL 01/17/2021) 784(O) 91  BUN 6 - 20 mg/dL 962(X) 52(W) 41(L)  Creatinine 0.61 - 1.24 mg/dL 24(M) 0.10(U) 7.25(D)  BUN/Creat Ratio 9 - 20 - - -  Sodium 135 - 145 mmol/L 135 138 139  Potassium 3.5 - 5.1 mmol/L 5.3(H) 5.7(H) 6.3(HH)  Chloride 98 - 111 mmol/L 108 110 110  CO2 22 - 32 mmol/L 22 20(L) 20(L)  Calcium 8.9 - 10.3 mg/dL 6.64(Q) 0.3(K) 9.0   Consults:  Neurology  Significant Diagnostic Tests:  MRI brain 2/4 > Acute infarction in left pons. Second acute infarction in right lateral splenium of corpus callosum. Third small focus in medial left temporal lobe.   MRA head/neck 2/4 > No large or medium vessel occlusion. Vertebral artery origins not visible.  Bilateral carotid  ultrasound 2/4 > No evidence of thrombus, dissection, atherosclerotic plaque or stenosis in cervical carotid system. Bilateral vertebral arteries demonstrate antegrade flow. Normal flow hemodynamics were seen in bilateral subclavian arteries.  TTE 2/4 > LVEF 60-65%. No regional wall abnormalities. Grade I diastolic dysfunction. Normal right ventricular systolic function. Mildly dilated left atrium. Mild mitral regurgitation. Mild dilation of ascending aorta.   Micro Data:  COVID-19 > negative  Summary  [name] is 50yo person with hypertension, type 2 diabetes mellitus, previous CVA 8/21, tobacco use disorder, CKD IIIa, OSA admitted 2/4 with acute kidney injury and hyperkalemia, found to have multipl acute cerebral infarctions.  Assessment & Plan:  Active Problems:   Right sided weakness   Hyperkalemia   AKI (acute kidney injury) (HCC)   Dysarthria   Cerebral thrombosis with cerebral infarction  #Acute cerebral infarctions #RF: HTN, T2DM, HLD, tobacco use, prior CVA Patient found to have multiple acute infarctions, including one in left pons, another in left temporal lobe, and another in right lateral splenium of corpus callosum. Neurology on board. Follow-up imaging revealed no large or medium vessel occlusion, patent carotids, and mildly dilated left atrium w/ normal systolic function on Echo. Possibly infarction d/t embolic source given patient has history of atrial fibrillation or d/t small vessel disease given hx hypertension, diabetes, hyperlipidemia, and tobacco use. A1c 7.7 on  admission, LDL 102. Started patient on Lipitor, will need to monitor as he was previously not taking statin d/t previous non-alcohol related pancreatitis. Will continue on patient's home anticoagulation and add anti-platelet today. - C/w Xarelto 20mg  qd, ASA 81mg  qd - C/w Lipitor 80mg  qd - Allowing permissive hypertension, holding anti-hypertensives - Pending PT/OT/SLP  #Hyperkalemia K 6.3 on admission with  peaked T waves on ECG. Most likely in setting of spironolactone and lisinopril. Received calcium gluconate and lokelma yesterday, down to 5.6 this AM. Will give lokelma 10mg  BID today and re-check in AM. - Holding spironolactone, lisinopril - Lokelma 10mg  BID - F/u AM BMP  #Acute kidney injury on CKD IIIa On admission sCr 1.48, now 1.26 this AM s/p fluids. Expect to continue to improve as he is able to tolerate po. Baseline renal function sCr 1.1. Will continue to monitor. - I&O's - Avoid nephrotoxic meds  #Type 2 diabetes mellitus A1c 7.7 on admission. AM CBG 143. Glucose elevated throughout the day, will adjust sensitivity of sliding scale, can add on long-acting tomorrow if glucose continues to be elevated. - SSI  Best practice:  DIET: HH/CM IVF: n/a DVT PPX: Xarelto BOWEL: n/a CODE: FULL FAM COM: n/a  , MD Internal Medicine Resident PGY-1 PAGER: [phone] 11/20/2020 6:33 AM  If after hours (below), please contact on-call pager: [phone] 5PM-7AM Monday-Friday 1PM-7AM Saturday-Sunday

## 2020-11-20 NOTE — Progress Notes (Signed)
   11/20/20 2328  BiPAP/CPAP/SIPAP  $ Non-Invasive Ventilator  Non-Invasive Vent Set Up  $ Non-Invasive Home Ventilator  Initial  BiPAP/CPAP/SIPAP Pt Type Adult  Mask Type Full face mask  Mask Size Extra large  EPAP 8 cmH2O  Oxygen Percent 21 %  Placed pt. On cpap pt. Didn't tolerate cpap mask was removed.

## 2020-11-20 NOTE — Evaluation (Signed)
Clinical/Bedside Swallow Evaluation Patient Details  Name: Terin Cragle. MRN: [id number] Date of Birth: [birth year]  Today's Date: 11/20/2020 Time: SLP Start Time (ACUTE ONLY): 8502 SLP Stop Time (ACUTE ONLY): 0946 SLP Time Calculation (min) (ACUTE ONLY): 21 min  Past Medical History:  Past Medical History:  Diagnosis Date  . Diabetes mellitus   . GERD (gastroesophageal reflux disease)   . History of alcohol abuse   . History of cocaine use   . History of echocardiogram    a. Echo 4/14: Moderate LVH, vigorous LVEF, EF 65-70%, normal wall motion, grade 2 diastolic dysfunction, mildly dilated aortic root and ascending aorta, ascending aorta 40 mm, aortic root 38 mm, mild LAE  . Hx of cardiovascular stress test    a. GXT 5/14: No ischemic changes  //  b. ETT-Myoview 3/16:  Low risk, no ischemia, EF 58%  . Hyperlipidemia   . Hypertension   . Morbid obesity (HCC)   . Paroxysmal atrial fibrillation (HCC)    Occurring in 2008, with several recurrence since then (including in the setting of + cocaine on UDS).  . Sleep apnea    uses CPAP  . SVT (supraventricular tachycardia) (HCC)    mid to long RP SVT 09/2019   Past Surgical History:  Past Surgical History:  Procedure Laterality Date  . APPENDECTOMY    . ATRIAL FIBRILLATION ABLATION N/A 11/27/2019   Procedure: ATRIAL FIBRILLATION ABLATION;  Surgeon: [name], MD;  Location: [facility];  Service: Cardiovascular;  Laterality: N/A;  . ROTATOR CUFF REPAIR    . SVT ABLATION N/A 11/27/2019   Procedure: SVT ABLATION;  Surgeon: [name], MD;  Location: [facility] INVASIVE CV LAB;  Service: Cardiovascular;  Laterality: N/A;  . TONSILLECTOMY     HPI:  Pt is a 50 y/o male with PMH of diabetes, HTN, morbid obesity, history of polysubstance abuse (cocaine and alcohol), pt is a direct admit from outpatient due to R sided weakness and hyperkalemia MRI brain 11/19/20 indicated Acute infarction in the left pons. Second acute infarction  in the right lateral splenium of the corpus callosum. Third small focus of acute infarction in the medial left temporal lobe/hippocampus. Given the other old ischemic changes throughout the brain, these are likely due to micro embolic disease within the posterior circulation.  Pt currently receiving ST in Neuro outpatient clinic for speech/memory per pt report.  BSE generated d/t new CVAs noted on MRI brain 11/19/20 and current NPO status.  Assessment / Plan / Recommendation Clinical Impression  Pt seen for BSE with various consistencies assessed including: thin via cup with various volumes, puree, solids and medications given with puree during assessment.  Pt c/o "coughing with liquids sometimes"; however,  no overt s/s of aspiration noted during clinical swallowing evaluation. Pt's speech is mildly dysarthric, but intelligible within conversation with low intensity noted.  Pt currently being seen at Neuro rehab for speech/memory deficits from prior CVAs per pt report prior to this hospitalization.  OME revealed slight right labial/lingual deviation, but appears functional for swallowing efforts.  Recommend continue OP ST services once pt d/c and ST will f/u for diet tolerance/education while pt in acute setting.  Thank you for this consult. SLP Visit Diagnosis: Dysphagia, unspecified (R13.10)    Aspiration Risk  Mild aspiration risk    Diet Recommendation   Regular/thin liquids  Medication Administration: Whole meds with puree    Other  Recommendations Oral Care Recommendations: Oral care BID   Follow up Recommendations Outpatient SLP  Frequency and Duration min 1 x/week  1 week       Prognosis Prognosis for Safe Diet Advancement: Good      Swallow Study   General Date of Onset: 11/19/20 HPI: Pt is a 50 y/o male with PMH of diabetes, HTN, morbid obesity, history of polysubstance abuse (cocaine and alcohol), pt is a direct admit from outpatient due to R sided weakness and  hyperkalemia Type of Study: Bedside Swallow Evaluation Previous Swallow Assessment: n/a Diet Prior to this Study: NPO Temperature Spikes Noted: No Respiratory Status: Room air History of Recent Intubation: No Behavior/Cognition: Alert;Cooperative;Distractible Oral Cavity Assessment: Within Functional Limits Oral Care Completed by SLP: Other (Comment) (Pt brushed teeth prior to BSE) Oral Cavity - Dentition: Adequate natural dentition Vision: Functional for self-feeding Self-Feeding Abilities: Able to feed self Patient Positioning: Upright in bed Baseline Vocal Quality: Low vocal intensity Volitional Cough: Weak Volitional Swallow: Able to elicit    Oral/Motor/Sensory Function Overall Oral Motor/Sensory Function: Mild impairment Facial Symmetry: Abnormal symmetry right;Other (Comment) (slight) Facial Sensation: Within Functional Limits Lingual ROM: Within Functional Limits Lingual Symmetry: Abnormal symmetry right;Other (Comment) (slight) Velum: Within Functional Limits Mandible: Within Functional Limits   Ice Chips Ice chips: Within functional limits Presentation: Spoon   Thin Liquid Thin Liquid: Within functional limits Presentation: Cup;Spoon Other Comments: c/o "coughing with liquids"    Nectar Thick Nectar Thick Liquid: Not tested   Honey Thick Honey Thick Liquid: Not tested   Puree Puree: Within functional limits Presentation: Self Fed   Solid     Solid: Impaired Presentation: Self Fed Oral Phase Functional Implications: Prolonged oral transit (slight)      Tressie Stalker, M.S., CCC-SLP 11/20/2020,9:55 AM

## 2020-11-20 NOTE — Progress Notes (Signed)
Patient received from 3E via wheelchair;  Patient oriented to room and unit routine.

## 2020-11-21 DIAGNOSIS — I639 Cerebral infarction, unspecified: Secondary | ICD-10-CM | POA: Diagnosis not present

## 2020-11-21 DIAGNOSIS — R471 Dysarthria and anarthria: Secondary | ICD-10-CM | POA: Diagnosis not present

## 2020-11-21 DIAGNOSIS — E875 Hyperkalemia: Secondary | ICD-10-CM | POA: Diagnosis not present

## 2020-11-21 DIAGNOSIS — I6330 Cerebral infarction due to thrombosis of unspecified cerebral artery: Secondary | ICD-10-CM | POA: Diagnosis not present

## 2020-11-21 DIAGNOSIS — R531 Weakness: Secondary | ICD-10-CM | POA: Diagnosis not present

## 2020-11-21 LAB — CBC
HCT: 37.5 % — ABNORMAL LOW (ref 39.0–52.0)
Hemoglobin: 12.9 g/dL — ABNORMAL LOW (ref 13.0–17.0)
MCH: 28.8 pg (ref 26.0–34.0)
MCHC: 34.4 g/dL (ref 30.0–36.0)
MCV: 83.7 fL (ref 80.0–100.0)
Platelets: 273 10*3/uL (ref 150–400)
RBC: 4.48 MIL/uL (ref 4.22–5.81)
RDW: 13.8 % (ref 11.5–15.5)
WBC: 8.2 10*3/uL (ref 4.0–10.5)
nRBC: 0 % (ref 0.0–0.2)

## 2020-11-21 LAB — BASIC METABOLIC PANEL
Anion gap: 7 (ref 5–15)
BUN: 29 mg/dL — ABNORMAL HIGH (ref 6–20)
CO2: 22 mmol/L (ref 22–32)
Calcium: 8.9 mg/dL (ref 8.9–10.3)
Chloride: 108 mmol/L (ref 98–111)
Creatinine, Ser: 1.51 mg/dL — ABNORMAL HIGH (ref 0.61–1.24)
GFR, Estimated: 56 mL/min — ABNORMAL LOW (ref >60)
Glucose, Bld: 189 mg/dL — ABNORMAL HIGH (ref 70–99)
Potassium: 5 mmol/L (ref 3.5–5.1)
Sodium: 137 mmol/L (ref 135–145)

## 2020-11-21 LAB — GLUCOSE, CAPILLARY
Glucose-Capillary: 207 mg/dL — ABNORMAL HIGH (ref 70–99)
Glucose-Capillary: 214 mg/dL — ABNORMAL HIGH (ref 70–99)

## 2020-11-21 MED ORDER — APIXABAN 5 MG PO TABS: 5.0000 mg | ORAL_TABLET | Freq: Two times a day (BID) | ORAL | 0 refills | Status: DC

## 2020-11-21 MED ORDER — SODIUM POLYSTYRENE SULFONATE PO POWD: Freq: Two times a day (BID) | ORAL | 0 refills | Status: AC

## 2020-11-21 MED ORDER — AMLODIPINE BESYLATE 5 MG PO TABS: 10.0000 mg | ORAL_TABLET | Freq: Every day | ORAL | 11 refills | Status: DC

## 2020-11-21 MED ORDER — ATORVASTATIN CALCIUM 80 MG PO TABS: 80.0000 mg | ORAL_TABLET | Freq: Every day | ORAL | 0 refills | Status: DC

## 2020-11-21 MED ORDER — HYDROCHLOROTHIAZIDE 12.5 MG PO CAPS: 25.0000 mg | ORAL_CAPSULE | Freq: Every day | ORAL | 0 refills | Status: DC

## 2020-11-21 MED ORDER — ASPIRIN 81 MG PO TBEC: 81.0000 mg | DELAYED_RELEASE_TABLET | Freq: Every day | ORAL | 11 refills | Status: DC

## 2020-11-21 NOTE — Discharge Summary (Signed)
Name: [name]. MRN: [id number] DOB: [birth year] 50 y.o. PCP: Evlyn Kanner, MD  Date of Admission: 11/19/2020  2:02 PM Date of Discharge: 11/21/20 Attending Physician: Tyson Alias, *  Discharge Diagnosis: 1. Acute multifocal cerebral infarcts 2. Hyperkalemia 3. Tobacco use disorder 4. Hypertension 5. Hyperlipidemia 6. Type 2 Diabetes Mellitus 7. Acute Kidney Injury 8. CKD IIIa 9. Class 3 Obesity 10. Paroxysmal Atrial fibrillation 11. Poor dentition  Discharge Medications: Allergies as of 11/21/2020      Reactions   Shrimp [shellfish Allergy] Anaphylaxis   Banana Other (See Comments)   Pt gags   Watermelon Flavor Nausea And Vomiting   Other Itching   Grapes cause itching   Almond Oil Itching   Roof of mouth itches   Peanut-containing Drug Products Itching, Cough   Sulfa Antibiotics Itching      Medication List    STOP taking these medications   diltiazem 30 MG tablet Commonly known as: Cardizem   ibuprofen 200 MG tablet Commonly known as: ADVIL   lisinopril 40 MG tablet Commonly known as: ZESTRIL   Lokelma 10 g Pack packet Generic drug: sodium zirconium cyclosilicate   spironolactone 25 MG tablet Commonly known as: ALDACTONE   Xarelto 20 MG Tabs tablet Generic drug: rivaroxaban     TAKE these medications   amLODipine 5 MG tablet Commonly known as: NORVASC Take 2 tablets (10 mg total) by mouth daily. What changed: how much to take   amphetamine-dextroamphetamine 30 MG 24 hr capsule Commonly known as: ADDERALL XR Take 1 capsule (30 mg total) by mouth daily as needed (focus). 1 daily as needed What changed:   reasons to take this  additional instructions   apixaban 5 MG Tabs tablet Commonly known as: ELIQUIS Take 1 tablet (5 mg total) by mouth 2 (two) times daily.   aspirin 81 MG EC tablet Take 1 tablet (81 mg total) by mouth daily. Swallow whole.   atorvastatin 80 MG tablet Commonly known as: LIPITOR Take 1  tablet (80 mg total) by mouth daily. Start taking on: November 22, 2020 What changed:   medication strength  how much to take   Blood Pressure Monitor/L Cuff Misc 1 Units by Does not apply route daily.   flecainide 100 MG tablet Commonly known as: TAMBOCOR Take 1 tablet (100 mg total) by mouth 2 (two) times daily.   glipiZIDE 10 MG tablet Commonly known as: GLUCOTROL Take 10 mg by mouth 2 (two) times daily before a meal.   hydrochlorothiazide 12.5 MG capsule Commonly known as: Microzide Take 2 capsules (25 mg total) by mouth daily.   lactose free nutrition Liqd Take 237 mLs by mouth daily.   metFORMIN 500 MG tablet Commonly known as: GLUCOPHAGE Take 1,000 mg by mouth 2 (two) times daily with a meal.   metoprolol succinate 100 MG 24 hr tablet Commonly known as: Toprol XL Take 1 tablet (100 mg total) by mouth daily. Take with or immediately following a meal.   nicotine 21 mg/24hr patch Commonly known as: NICODERM CQ - dosed in mg/24 hours Place 21 mg onto the skin daily as needed (smoking cessation).   pantoprazole 40 MG tablet Commonly known as: PROTONIX Take 1 tablet (40 mg total) by mouth daily. What changed: when to take this   sodium polystyrene powder Commonly known as: KAYEXALATE Take by mouth in the morning and at bedtime.   Trulicity 0.75 MG/0.5ML Sopn Generic drug: Dulaglutide Inject 0.75 mg into the skin once a week. What  changed: when to take this       Disposition and follow-up:   [name] was discharged from [facility] in Stable condition.  At the hospital follow up visit please address:  Acute multifocal cerebral infarcts. Embolic Vs microvascular. Presented with 3d hx of right sided weakness.  Risk factors: Hypertension, hyperlipidemia, diabetes, atrial fibrillation Antiplatelet agent: START aspirin 81mg  daily Follow up: Recommend continuing to try to mitigate risk factors. Home health PT ordered at  discharge. Follow up with neurology.  AKI, Hyperkalemia.  Medication changes: START kayexylate 1 packet BID, DISCONTINUE lisinopril, DISCONTINUE spironolactone Appointments: PCP in 2-3 days. Please recheck his potassium level at time of follow up. Please also ensure that he is not having financial barriers to obtaining kayexylate and is tolerating it well.  Tobacco use disorder. Continue to encourage cessation.  Hypertension Medication changes: DISCONTINUE Lisinopril, DISCONTINUE Spironolactone, INCREASE amlodipine to 10mg , START HCTZ 25mg  daily Appointments: PCP in 2-3 days. Message sent to our [facility] front desk to call him to arrange an appointment   Hyperlipidemia Medication change: INCREASE lipitor to 80mg  daily  Paroxysmal atrial fibrillation Medication changes: DISCONTINUE Xarelto, START Eliquis 5mg  twice daily  Poor dentition. Left lower molar appears severely decayed. He has pain in that region but did not appear infected on day of discharge. Please re-evaluate at follow up.   Labs / imaging needed at time of follow-up: BMP  Pending labs/ test needing follow-up: None  Follow-up Appointments:  Follow-up Information    , NP. Go on 12/14/2020.   Specialty: Neurology Contact information: [address] [place]  [phone]               Hospital Course: [name]. [place]. Is a 50 y.o. male with hx of CVA in August 2021 (with nearly resolved left sided deficits and dysarthria), OSA, narcolepsy with cataplexy, allergic rhinitis, Paroxysmal Atrial fibrillation (s/p ablation in 2021), HTN, DM (previous A1C of 9.9; A1C 7.7 this admission), and polysubstance use disorder (including cocaine-reports no use in six to seven years). Reports no alcohol use since ablation, and reduction in tobacco use to one pack a week.    Hyperkalemia Patient previously found to be hyperkalemic last year, with consequently discontinued spironolactone, but was unable  to afford outpatient Lokelma due to prohibitive costs. Patient with potassium of 6.3 on admission with peaked T waves; likely secondary to recently resumed spironolactone; and lisinopril use. Received lokelma, and calcium gluconate. Held Spironolactone and lisinopril. Additionally received insulin for T2DM management. Potassium down-trended, with K of 5 prior to discharge. Patient discharged on Kayexalate. Recommend f/u BMP out-patient.     Acute CVA Patient with history of prior CVA in Aug 2021 and PAF (s/p ablation; on Xarelto), with reported left-sided deficits that had been improving with therapy presented with onset of right-sided focality starting three days PTA, additionally endorsed worsening bladder control, and recent new-onset fecal incontinence. Bladder scan appropriate. Patient was past the point of acute intervention. Found to have multiple acute infarctions, including one in left pons, another in left temporal lobe, and another in right lateral splenium of corpus callosum. Carotids patent. No large or medium vessel occlusion noted. Echo without evidence of intracardiac source. Patient switched from home Xarelto to Eliquis, and additionally discharged on ASA 81, and Lipitor 80. Patient to f/u with stroke clinic NP at [facility] on 12/14/20; and continue with home therapy.   Acute kidney injury on CKD IIIa Patient with variable Cr readings PTA,  baseline ~1.1. On admission sCr 1.48. Improved with fluids. Patient notably tolerating po.   Discharge Vitals:   BP 134/88 (BP Location: Left Arm)   Pulse 63   Temp 98 F (36.7 C) (Oral)   Resp 18   Ht 5\' 9"  (1.753 m)   Wt (!) 139.2 kg   SpO2 98%   BMI 45.31 kg/m    Discharge Exam General: chronically ill appearing Neuro: A/O x4. CN III-XII intact. 5/5 strength in the extremities with slightly decreased strength on the right compared to left. Sensation intact.    Pertinent Labs, Studies, and Procedures:   MR BRAIN WO CONTRAST, MRA  HEAD Result Date: 11/19/2020 IMPRESSION: Acute infarction in the left pons. Second acute infarction in the right lateral splenium of the corpus callosum. Third small focus of acute infarction in the medial left temporal lobe/hippocampus. Given the other old ischemic changes throughout the brain, these are likely due to micro embolic disease within the posterior circulation. MRA HEAD: No large or medium vessel occlusion or correctable proximal stenosis. Some atherosclerotic irregularity in the siphon regions but no flow limiting stenosis. MRA NECK: No abnormality seen in the region visualized. Vertebral artery origins not visible due to technical factors inherent to noncontrast technique.   ECHOCARDIOGRAM COMPLETE Result Date: 11/19/2020 IMPRESSIONS  1. Left ventricular ejection fraction, by estimation, is 60 to 65%. The left ventricle has normal function. The left ventricle has no regional wall motion abnormalities. Left ventricular diastolic parameters are consistent with Grade I diastolic dysfunction (impaired relaxation).  2. Right ventricular systolic function is normal. The right ventricular size is normal.  3. Left atrial size was mildly dilated.  4. The mitral valve is normal in structure. Mild mitral valve regurgitation. No evidence of mitral stenosis. Moderate mitral annular calcification.  5. The aortic valve is normal in structure. Aortic valve regurgitation is not visualized. No aortic stenosis is present.  6. Aortic dilatation noted. There is mild dilatation of the ascending aorta, measuring 39 mm.  7. The inferior vena cava is normal in size with greater than 50% respiratory variability, suggesting right atrial pressure of 3 mmHg. Comparison(s): No significant change from prior study. Prior images reviewed side by side. Conclusion(s)/Recommendation(s): No intracardiac source of embolism detected on this transthoracic study.   VAS 01/17/2021 CAROTID Result Date: 11/19/2020 Summary: Right Carotid: There was  no evidence of thrombus, dissection, atherosclerotic plaque or stenosis in the cervical carotid system. Left Carotid: There was no evidence of thrombus, dissection, atherosclerotic plaque or stenosis in the cervical carotid system. Vertebrals:  Bilateral vertebral arteries demonstrate antegrade flow. Subclavians: Normal flow hemodynamics were seen in bilateral subclavian arteries.     Discharge Instructions: Discharge Instructions    Ambulatory referral to Occupational Therapy   Complete by: As directed       Signed:  01/17/2021, MD Internal Medicine Resident PGY-2 [facility] Pager: [phone] 11/21/2020 3:55 PM

## 2020-11-21 NOTE — Progress Notes (Incomplete)
Subjective: Patient seen eating breakfast. Reports doing well. Conveys still some unsteadiness with walking. Some superior left tooth pain in molars, which has been present for several days PTA. Worsens with eating, no change with food temperature. Eating okay, just avoiding chewing on left. No particles stuck. Denies ear pain. He had some teeth pulled two years ago.   Reports difficulty sleeping even with ramelteon; he has been sleeping in chair because uncomfortable sleeping in bed because he does not like bed alarm. Discussed treatment regimen, CVA risk reduction, and provided tobacco cessation counseling. He does not desire nicotine patch at this time, has tried in past, but may desire at discharge. Reports smokes at home when bored.   Objective:  Vital signs in last 24 hours: Vitals:   11/20/20 1722 11/20/20 1945 11/20/20 2352 11/21/20 0416  BP: (!) 151/72 (!) 169/89 (!) 145/79 (!) 167/90  Pulse: 64 65 68 67  Resp: 20 19  18   Temp: 98.5 F (36.9 C) 98.4 F (36.9 C)  97.9 F (36.6 C)  TempSrc: Oral Oral  Oral  SpO2: 98% 94% 97% 98%  Weight:      Height:       Weight change:   Intake/Output Summary (Last 24 hours) at 11/21/2020 01/19/2021 Last data filed at 11/20/2020 1250 Gross per 24 hour  Intake 90 ml  Output 500 ml  Net -410 ml    Physical Exam Constitutional:      General: He is not in acute distress.    Appearance: He is obese. He is not ill-appearing, toxic-appearing or diaphoretic.  HENT:     Mouth/Throat:     Mouth: Mucous membranes are moist.     Dentition: Dental caries present.  Cardiovascular:     Rate and Rhythm: Normal rate and regular rhythm.  Pulmonary:     Effort: Pulmonary effort is normal. No respiratory distress.  Neurological:     Mental Status: He is alert.     Cranial Nerves: Dysarthria (moderate) present.     Sensory: Sensation is intact.     Motor: Weakness present.     Comments: 5/5 strength on LEs, and RLE. 4/5 strength to RUE. Appropriate  finger to nose bilaterally though slowed.    Assessment/Plan:  Principal Problem:   Cerebral thrombosis with cerebral infarction Active Problems:   Right sided weakness   Hyperkalemia   AKI (acute kidney injury) (HCC)   Dysarthria   #Acute cerebral infarctions #RF: HTN, T2DM, HLD, tobacco use, prior CVA Patient found to have multiple acute infarctions, including one in left pons, another in left temporal lobe, and another in right lateral splenium of corpus callosum. Neurology followed. Follow-up imaging revealed no large or medium vessel occlusion, patent carotids, and mildly dilated left atrium w/ normal systolic function on Echo. Possibly infarction d/t embolic source given patient has history of atrial fibrillation or d/t small vessel disease given hx hypertension, diabetes, hyperlipidemia, and tobacco use. A1c 7.7 on admission, LDL 102. Started patient on Lipitor, will need to monitor as he was previously not taking statin d/t previous non-alcohol related pancreatitis. Switching anticoagulation due to Xarelto failure per Neurology recs. -Switched from home Xarelto to Eliquis per pharmacy -C/w ASA 81mg  qd -C/w Lipitor 80mg  qd -Neurology followed, signed off  -patient to f/u with stroke clinic NP at [facility] on 12/14/20  -PT/OT/SLP recommending continued outpatient f/u -SLP recs: No overt s/s aspiration during clinical swallowing eval. Regular/thin liquids. Whole meds with puree.    #Hyperkalemia K 6.3 on admission with  peaked T waves on ECG. Most likely in setting of spironolactone and lisinopril. Received calcium gluconate and additional lokelma on admission. Started on lokelma 10mg  BID yesterday. Potassium normalizing. - Holding spironolactone, lisinopril - Lokelma 10g BID - Trend BMP   #Acute kidney injury on CKD IIIa On admission sCr 1.48. Decreased with fluids. Some bump in Cr to 1.51 today. Patient notably tolerating po. Baseline renal function sCr 1.1.   - I&O's - Avoid  nephrotoxic meds   LOS: 0 days   , Medical Student 11/21/2020, 6:28 AM

## 2020-11-21 NOTE — Hospital Course (Addendum)
[name]. Is a 50 y.o. male with hx of CVA in August 2021 (with nearly resolved left sided deficits and dysarthria), OSA, narcolepsy with cataplexy, allergic rhinitis, Paroxysmal Atrial fibrillation (s/p ablation in 2021), HTN, DM (previous A1C of 9.9; A1C 7.7 this admission), and polysubstance use disorder (including cocaine-reports no use in six to seven years). Reports no alcohol use since ablation, and reduction in tobacco use to one pack a week. Patient admitted from clinic for acute CVA few days prior, hyperkalemia with peaked T waves, and AKI.    Hyperkalemia Patient previously found to be hyperkalemic last year, with consequently discontinued spironolactone, but was unable to afford outpatient Lokelma due to prohibitive costs. Patient with potassium of 6.3 on admission with peaked T waves; likely secondary to recently resumed spironolactone; and lisinopril use. Received lokelma, and calcium gluconate. Held Spironolactone and lisinopril. Additionally received insulin for T2DM management. Potassium down-trended, with K of 5 prior to discharge. Patient discharged on Kayexalate. Recommend f/u BMP out-patient.     Acute CVA Patient with history of prior CVA in Aug 2021 and PAF (s/p ablation; on Xarelto), with reported left-sided deficits that had been improving with therapy presented with onset of right-sided focality starting three days PTA, additionally endorsed worsening bladder control, and recent new-onset fecal incontinence. Bladder scan appropriate. Patient was past the point of acute intervention. Found to have multiple acute infarctions, including one in left pons, another in left temporal lobe, and another in right lateral splenium of corpus callosum. Carotids patent. No large or medium vessel occlusion noted. Echo without evidence of intracardiac source. Patient switched from home Xarelto to Eliquis, and additionally discharged on ASA 81, and Lipitor 80. Patient to f/u with stroke  clinic NP at [facility] on 12/14/20; and continue with home therapy.   Acute kidney injury on CKD IIIa Patient with variable Cr readings PTA, baseline ~1.1. On admission sCr 1.48. Decreased with fluids. Mild bump in Cr to 1.51 today. Patient notably tolerating po. Recommend f/u BMP outpatient

## 2020-11-21 NOTE — Discharge Instructions (Signed)

## 2020-11-21 NOTE — Care Management (Signed)
Spoke w patient at bedside. Provided with both copayt and 30 day card for Eliquis.  He is currently at [facility] for ST and PT. We wants to continue there for therapies after DC. Will send referral for OT.

## 2020-11-22 ENCOUNTER — Ambulatory Visit: Payer: 59 | Admitting: Physical Therapy

## 2020-11-22 ENCOUNTER — Telehealth: Payer: Self-pay | Admitting: Student

## 2020-11-22 ENCOUNTER — Telehealth: Payer: Self-pay | Admitting: Physical Therapy

## 2020-11-22 ENCOUNTER — Telehealth: Payer: Self-pay | Admitting: Internal Medicine

## 2020-11-22 ENCOUNTER — Encounter: Payer: Self-pay | Admitting: Physical Therapy

## 2020-11-22 ENCOUNTER — Other Ambulatory Visit: Payer: Self-pay

## 2020-11-22 VITALS — BP 158/87 | HR 67

## 2020-11-22 DIAGNOSIS — R2681 Unsteadiness on feet: Secondary | ICD-10-CM | POA: Diagnosis not present

## 2020-11-22 DIAGNOSIS — M6281 Muscle weakness (generalized): Secondary | ICD-10-CM

## 2020-11-22 DIAGNOSIS — R2689 Other abnormalities of gait and mobility: Secondary | ICD-10-CM

## 2020-11-22 MED ORDER — AMPHETAMINE-DEXTROAMPHET ER 30 MG PO CP24
30.0000 mg | ORAL_CAPSULE | Freq: Every day | ORAL | 0 refills | Status: DC | PRN
Start: 2020-11-22 — End: 2020-12-20

## 2020-11-22 NOTE — Telephone Encounter (Signed)
Dr. Rebeca Allegra. Marijo Conception, Drema Halon, Montez Hageman. Is a patient of mine, who has been seen for physical therapy; he presented today to OPPT following weekend hospitalization for acute CVA.  He presents with new onset RLE weakness, decreased balance and decreased gait stability in comparison to before this recent CVA.  Due to his high fall risk on balance assessments in re-eval today, he would benefit from a rolling walker for improved safety and stability with gait.  If you agree, could you please write order for rolling walker via Epic?  Thank you.  Lonia Blood, PT 11/22/20 8:11 PM Phone: [phone] Fax: [phone]

## 2020-11-22 NOTE — Telephone Encounter (Signed)
Called and spoke with patient who is requesting a refill on his adderall. Patient uses [facility] on [address]   Dr. Maple Hudson please advise

## 2020-11-22 NOTE — Telephone Encounter (Signed)
Adderall refillwed

## 2020-11-22 NOTE — Telephone Encounter (Signed)
TOC HFU [facility] FOR 11/24/2020 @ 1:15 PM WITH DR Huel Cote

## 2020-11-22 NOTE — Therapy (Signed)
[facility] [address] [place], [place], [address] Phone: [phone]   Fax:  [phone]  Physical Therapy Treatment/Re-Evaluation  Patient Details  Name: [name]. MRN: [id number] Date of Birth: 09/05/1971 Referring Provider (PT): Miguel Aschoff, MD   Encounter Date: 11/22/2020   PT End of Session - 11/22/20 1942    Visit Number 17    Number of Visits 32   per recert 11/22/2020, to include 2/7 visit   Authorization Type [employer]-60 visit limit PT/OT/speech (each visit of each type counts); as of 11/22/2020, pt has had 11 TOTAL visits in 2022    Authorization - Visit Number 6   PT visits in 2022   Authorization - Number of Visits 20   divided 60/3 disciplines-may need to discuss with other discplines   PT Start Time 0806    PT Stop Time 0846    PT Time Calculation (min) 40 min    Equipment Utilized During Treatment Gait belt    Activity Tolerance Patient tolerated treatment well;Patient limited by fatigue    Behavior During Therapy [facility] for tasks assessed/performed           Past Medical History:  Diagnosis Date  . Diabetes mellitus   . GERD (gastroesophageal reflux disease)   . History of alcohol abuse   . History of cocaine use   . History of echocardiogram    a. Echo 4/14: Moderate LVH, vigorous LVEF, EF 65-70%, normal wall motion, grade 2 diastolic dysfunction, mildly dilated aortic root and ascending aorta, ascending aorta 40 mm, aortic root 38 mm, mild LAE  . Hx of cardiovascular stress test    a. GXT 5/14: No ischemic changes  //  b. ETT-Myoview 3/16:  Low risk, no ischemia, EF 58%  . Hyperlipidemia   . Hypertension   . Morbid obesity (HCC)   . Paroxysmal atrial fibrillation (HCC)    Occurring in 2008, with several recurrence since then (including in the setting of + cocaine on UDS).  . Sleep apnea    uses CPAP  . SVT (supraventricular tachycardia) (HCC)    mid to long RP SVT 09/2019     Past Surgical History:  Procedure Laterality Date  . APPENDECTOMY    . ATRIAL FIBRILLATION ABLATION N/A 11/27/2019   Procedure: ATRIAL FIBRILLATION ABLATION;  Surgeon: [name], MD;  Location: [facility] INVASIVE CV LAB;  Service: Cardiovascular;  Laterality: N/A;  . ROTATOR CUFF REPAIR    . SVT ABLATION N/A 11/27/2019   Procedure: SVT ABLATION;  Surgeon: [name], MD;  Location: [facility] INVASIVE CV LAB;  Service: Cardiovascular;  Laterality: N/A;  . TONSILLECTOMY      Vitals:   11/22/20 0823  BP: (!) 158/87  Pulse: 67     Subjective Assessment - 11/22/20 0818    Subjective Was in the hospital over the weekend, due to high potassium and they think I had another stroke with R sided weakness.  Also, my speech is different.  They changed some of my medications in the hospital.  I will pick up the new one today.  Hard to put weight on my right side and having a hard time using my R hand    Pertinent History Recent hospitalization 2/4-11/21/20 (Dr. Marvel Plan and RN at d/c recommend OP PT)    Patient Stated Goals Pt's goals for therapy are to get back to where I was with strength and coordination.  11/22/20:  To get back where I was before this  stroke.    Currently in Pain? No/denies              [facility] PT Assessment - 11/22/20 0001      ROM / Strength   AROM / PROM / Strength Strength      Strength   Strength Assessment Site Hip;Knee;Ankle    Right/Left Hip Left;Right    Right Hip Flexion 3+/5    Left Hip Flexion 5/5    Right/Left Knee Right;Left    Right Knee Flexion 4/5    Right Knee Extension 4/5    Left Knee Flexion 5/5    Left Knee Extension 5/5    Right/Left Ankle Right;Left    Right Ankle Dorsiflexion 4/5    Left Ankle Dorsiflexion 5/5      Transfers   Transfers Sit to Stand;Stand to Sit    Sit to Stand 6: Modified independent (Device/Increase time);Without upper extremity assist;From chair/3-in-1;5: Supervision    Five time sit to stand comments  24.82    Stand to  Sit 6: Modified independent (Device/Increase time);Without upper extremity assist;To chair/3-in-1;5: Supervision    Comments Hesitation, RLE gives way after 3rd attempt      Ambulation/Gait   Ambulation/Gait Yes    Ambulation/Gait Assistance 4: Min guard    Ambulation Distance (Feet) 60 Feet   x 4   Assistive device None   Trialed SPC and trialed RW   Gait Pattern Step-through pattern;Decreased step length - right;Decreased step length - left;Decreased stance time - right;Decreased weight shift to right;Trendelenburg;Narrow base of support   Overall decreased stability compared to previous gait pattern   Ambulation Surface Level;Indoor    Gait velocity 34.25 sec= 0.96 ft/sec    Gait Comments Trialed SPC x 60 ft, with min assist and cues for technqiue/sequence.  Pt with narrow BOS and placement of cane too close to body, causing him to nearly trip over it.  Trial of RW 60 ft x 2, with pt needing cues to avoid leaning over and pushing too hard through BUEs.      Standardized Balance Assessment   Standardized Balance Assessment Timed Up and Go Test;Berg Balance Test      Berg Balance Test   Sit to Stand Able to stand  independently using hands    Standing Unsupported Unable to stand 30 seconds unassisted   Attempted x 2, posterior LOB both trials   Sitting with Back Unsupported but Feet Supported on Floor or Stool Able to sit 2 minutes under supervision    Stand to Sit Controls descent by using hands    Transfers Needs two people to assist of supervise to be safe    Standing Unsupported with Eyes Closed Needs help to keep from falling    Standing Unsupported with Feet Together Needs help to attain position and unable to hold for 15 seconds    From Standing, Reach Forward with Outstretched Arm Loses balance while trying/requires external support    From Standing Position, Pick up Object from Floor Unable to try/needs assist to keep balance    From Standing Position, Turn to Look Behind Over  each Shoulder Needs assist to keep from losing balance and falling    Turn 360 Degrees Needs assistance while turning    Standing Unsupported, Alternately Place Feet on Step/Stool Needs assistance to keep from falling or unable to try    Standing Unsupported, One Foot in [employer] balance while stepping or standing    Standing on One Leg Unable to try or  needs assist to prevent fall    Total Score 9    Berg comment: Not able to complete further parts of assessment, due to LOB in static standing x 2.      Timed Up and Go Test   TUG Normal TUG    Normal TUG (seconds) 23.4    TUG Comments Scores >13.5 sec indicate increased fall risk.                                 PT Education - 11/22/20 1940    Education Details Discussed pt's recent hospitalization for new CVA; POC for PT to address overall decreased strength, decreased balance and stability.  Recommend RW for safety with gait and PT to f/u with MD to request order.    Person(s) Educated Patient    Methods Explanation    Comprehension Verbalized understanding            PT Short Term Goals - 11/22/20 1956      PT SHORT TERM GOAL #1   Title Pt will be independent with revised HEP for improved strength, balance, gait.  TARGET 12/17/2020    Time 4    Period Weeks    Status Revised      PT SHORT TERM GOAL #2   Title Pt will improve TUG score to less than or equal to 18 seconds for decreased fall risk.    Baseline TUG 23.4 sec 11/22/20    Time 4    Period Weeks    Status New      PT SHORT TERM GOAL #3   Title Pt will improve Berg Balance score to at least 19/56 for decreased fall risk.    Baseline 9/56 11/22/20    Time 4    Period Weeks    Status New      PT SHORT TERM GOAL #4   Title Pt will improve 5x sit<>stand to less than or equal to 18 seconds to demonstrate improved functional strength.    Baseline 24.82 11/22/20    Time 4    Status New      PT SHORT TERM GOAL #5   Title Pt will verbalize  understanding of fall prevention in home environment.    Time 4    Period Weeks    Status New             PT Long Term Goals - 11/22/20 1959      PT LONG TERM GOAL #1   Title Pt will be independent with final progression of HEP for improved gait and balance.  TARGET 01/14/2021    Time 8    Period Weeks    Status Revised      PT LONG TERM GOAL #2   Title Pt will improve TUG score to less than or equal to 13.5 sec for decreased fall risk.    Time 8    Period Weeks    Status New      PT LONG TERM GOAL #3   Title Pt will improve Berg Balance score to at least 35/56 for decreased fall risk.    Time 8    Period Weeks    Status New      PT LONG TERM GOAL #4   Title Pt will improve 5x sit<>stand to less than or equal to 13 sec for improved functional lower extremity strength.    Time 8    Period  Weeks    Status New      PT LONG TERM GOAL #5   Title Pt will improve gait velocity to at least 2 ft/sec for improved gait efficiency and decreased fall risk.    Baseline 0.96 ft/sec 11/22/20    Time 8    Period Weeks    Status New      Additional Long Term Goals   Additional Long Term Goals Yes      PT LONG TERM GOAL #6   Title Pt will ambulate at least 1000 ft, indoors and outdoors surfaces, using least restrictive assistive device, mod independently, no LOB, for improved community ambulation.    Time 8    Period Weeks    Status New                 Plan - 11/22/20 1947    Clinical Impression Statement Pt presents to OPPT today status post hospitalization over the weekend, 11/19/20-11/21/20 due to hyperkalemia and acute multifocal cerebral infarcts.  He was discharged from hospital, with planned neurologist and PCP follow-up, with Dr. Marvel PlanJindong Xu and RN Care Management recommended return to OP PT.  He presents to OPPT with decreased RLE strength, decreased balance, decreased functional strength, decreased gait independence and stability, decreased gait velocity (all compared to  initial eval and his previous progress with PT).  Pt demo increased fall risk per Berg score of 9/56, TUG score of 23.4 sec, and gait velocity score of 0.96 ft/sec.  Pt demo decr functional lower extremity strength with 5x sit<>stand score of 24.82 sec.  Pt was making steady, slow progress towards previous goals of independent gait and improved balance for decreased falls; however, pt has noticeable status change since brief hospitalization for new CVA.  He would benefit from skilled PT to address the above stated deficits for decreased fall risk, improved return to independent functional mobility.  Please note revised STGs and LTGs.  Please note that order is being requested from PCP for rolling walker to aid in stability for patient's gait.    Personal Factors and Comorbidities Comorbidity 3+    Comorbidities diabetes, HTN, morbid obesity, history of polysubstance abuse (cocaine and alcohol)    Examination-Activity Limitations Locomotion Level;Transfers;Stairs    Examination-Participation Restrictions Community Activity;Yard Work    Stability/Clinical Decision Making Evolving/Moderate complexity    Rehab Potential Good    PT Frequency 2x / week    PT Duration 8 weeks   including week of recert, 11/22/2020   PT Treatment/Interventions ADLs/Self Care Home Management;Gait training;Stair training;Functional mobility training;Therapeutic activities;Therapeutic exercise;Balance training;Neuromuscular re-education;Patient/family education;DME Instruction    PT Next Visit Plan Renewal completed; requesting RW order from MD; please discuss how pt can get RW (may need to communicate with sister) and gait training with RW; revise HEP to address strength and balance    Recommended Other Services Order requested from MD for rolling walker    Consulted and Agree with Plan of Care Patient           Patient will benefit from skilled therapeutic intervention in order to improve the following deficits and  impairments:  Abnormal gait,Difficulty walking,Decreased balance,Decreased mobility  Visit Diagnosis: Unsteadiness on feet  Muscle weakness (generalized)  Other abnormalities of gait and mobility     Problem List Patient Active Problem List   Diagnosis Date Noted  . Cerebral thrombosis with cerebral infarction 11/20/2020  . Right sided weakness 11/19/2020  . Hyperkalemia 11/19/2020  . AKI (acute kidney injury) (HCC)   .  Dysarthria   . Amnesia 09/08/2020  . 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 Acute cerebrovascular accident (CVA) (HCC) 06/01/2020  . Paronychia of right thumb 05/10/2020  . Mediastinal adenopathy 01/06/2020  . Secondary hypercoagulable state (HCC) 10/22/2019  . Acute pancreatitis 01/22/2019  . Morbid obesity due to excess calories (HCC) 12/08/2016  . Paroxysmal atrial fibrillation (HCC) 01/23/2013  . Diabetes mellitus (HCC) 01/23/2013  . Hypertension 01/23/2013  . Tobacco user 01/23/2013  . Chronic diastolic heart failure (HCC) 01/21/2013  . 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 Shoulder pain, right 02/15/2011  . Seasonal and perennial allergic rhinitis 02/13/2008  . Dyslipidemia 02/12/2008  . Cocaine abuse (HCC) 02/12/2008  . Obstructive sleep apnea 02/12/2008    Gean Maidens. 11/22/2020, 8:04 PM  Gean Maidens., PT   [facility] [address] [place], [place], [address] Phone: [phone]   Fax:  [phone]  Name: Joran Ulin. MRN: [id number] Date of Birth: [birth year]

## 2020-11-22 NOTE — Telephone Encounter (Signed)
Spoke with patient. He is aware that the RX has been sent to his pharmacy. Nothing further needed at time of call.

## 2020-11-23 ENCOUNTER — Encounter: Payer: Self-pay | Admitting: Occupational Therapy

## 2020-11-23 ENCOUNTER — Encounter: Payer: Self-pay | Admitting: Gastroenterology

## 2020-11-23 ENCOUNTER — Ambulatory Visit: Payer: 59 | Admitting: Occupational Therapy

## 2020-11-23 DIAGNOSIS — R2681 Unsteadiness on feet: Secondary | ICD-10-CM | POA: Diagnosis not present

## 2020-11-23 DIAGNOSIS — R41842 Visuospatial deficit: Secondary | ICD-10-CM

## 2020-11-23 DIAGNOSIS — I69351 Hemiplegia and hemiparesis following cerebral infarction affecting right dominant side: Secondary | ICD-10-CM

## 2020-11-23 DIAGNOSIS — R278 Other lack of coordination: Secondary | ICD-10-CM

## 2020-11-23 DIAGNOSIS — I69318 Other symptoms and signs involving cognitive functions following cerebral infarction: Secondary | ICD-10-CM

## 2020-11-23 DIAGNOSIS — M6281 Muscle weakness (generalized): Secondary | ICD-10-CM

## 2020-11-23 NOTE — Telephone Encounter (Signed)
Transition Care Management Follow-up Telephone Call  Date of discharge and from where: 11/21/2020 from [facility]  How have you been since you were released from the hospital? "Alright"  Any questions or concerns? No  Items Reviewed:  Did the pt receive and understand the discharge instructions provided? Yes   Medications obtained and verified? Patient went to pick up RX's from pharmacy and was told they had to order some of the medications.  He doesn't remember which meds, but they will be in tomorrow.  Patient instructed to bring all medications to HFU.  Other? No   Any new allergies since your discharge? No   Dietary orders reviewed? Yes  Do you have support at home? Yes , Sister  Home Care and Equipment/Supplies: Were home health services ordered? no   Functional Questionnaire: (I = Independent and D = Dependent) ADLs: D  Bathing/Dressing- D  Meal Prep-D  Eating- D  Maintaining continence- I  Transferring/Ambulation- D  Managing Meds- D  Follow up appointments reviewed:   PCP Hospital f/u appt confirmed? Yes  Scheduled to see Dr. Huel Cote on 11/24/20 @ 1:15.  Specialist Hospital f/u appt confirmed? Yes  Pt had 1st rehab appt.  Are transportation arrangements needed? No   If their condition worsens, is the pt aware to call PCP or go to the Emergency Dept.? Yes  Was the patient provided with contact information for the PCP's office or ED? Yes  Was to pt encouraged to call back with questions or concerns? Yes

## 2020-11-23 NOTE — Therapy (Signed)
[facility] [address] [place], [place], [address] Phone: [phone]   Fax:  [phone]  Occupational Therapy Evaluation  Patient Details  Name: [name]. MRN: [id number] Date of Birth: 01-Apr-1971 Referring Provider (OT): Dr. Shanon Brow   Encounter Date: 11/23/2020   OT End of Session - 11/23/20 1251    Visit Number 1    Number of Visits 13    Date for OT Re-Evaluation 01/07/21    Authorization Type [employer]--copay per day, VL:60 each PT/OT/ST.   (3 previous OT visits used this year)    Authorization - Visit Number 13   (12 total OT/PT/ST used since 2022 at time of OT eval +eval=13   Authorization - Number of Visits 60    OT Start Time 0805    OT Stop Time 0847    OT Time Calculation (min) 42 min    Activity Tolerance Patient tolerated treatment well    Behavior During Therapy [facility] for tasks assessed/performed           Past Medical History:  Diagnosis Date  . Diabetes mellitus   . GERD (gastroesophageal reflux disease)   . History of alcohol abuse   . History of cocaine use   . History of echocardiogram    a. Echo 4/14: Moderate LVH, vigorous LVEF, EF 65-70%, normal wall motion, grade 2 diastolic dysfunction, mildly dilated aortic root and ascending aorta, ascending aorta 40 mm, aortic root 38 mm, mild LAE  . Hx of cardiovascular stress test    a. GXT 5/14: No ischemic changes  //  b. ETT-Myoview 3/16:  Low risk, no ischemia, EF 58%  . Hyperlipidemia   . Hypertension   . Morbid obesity (HCC)   . Paroxysmal atrial fibrillation (HCC)    Occurring in 2008, with several recurrence since then (including in the setting of + cocaine on UDS).  . Sleep apnea    uses CPAP  . SVT (supraventricular tachycardia) (HCC)    mid to long RP SVT 09/2019    Past Surgical History:  Procedure Laterality Date  . APPENDECTOMY    . ATRIAL FIBRILLATION ABLATION N/A 11/27/2019   Procedure: ATRIAL  FIBRILLATION ABLATION;  Surgeon: [name], MD;  Location: [facility] INVASIVE CV LAB;  Service: Cardiovascular;  Laterality: N/A;  . ROTATOR CUFF REPAIR    . SVT ABLATION N/A 11/27/2019   Procedure: SVT ABLATION;  Surgeon: [name], MD;  Location: [facility] INVASIVE CV LAB;  Service: Cardiovascular;  Laterality: N/A;  . TONSILLECTOMY      There were no vitals filed for this visit.   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    PMH: HTN, DM, sleep apnea, narcolepsy, hx of ETOH and cocaine abuse, CKD,  hyperkalemia, Paroxysmal Atrial fibrillation (s/p ablation in 2021), hx of CVA 06/01/20 with L-sided deficits    Limitations --    Patient Stated Goals improve R-sided strength    Currently in Pain? No/denies             [facility] OT Assessment - 11/23/20 0001      Assessment   Medical Diagnosis Acute CVA    Referring Provider (OT) Dr. 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, broadcasting; 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--has not returned since initial CVA    Leisure Used to go to drag strip races; afraid of doing that due to bleachers      ADL   Eating/Feeding Set up   for cutting food   Grooming Independent   pt reports unable to feel pressure when  brushing teeth   Upper Body Bathing Minimal assistance   for back   Lower Body Bathing Modified independent    Upper Body Dressing --   min A   Lower Body Dressing --   mod A   Toilet Transfer Modified independent    Toileting - Clothing Manipulation Modified independent    Toileting -  Hygiene Modified Independent    Tub/Shower Transfer Minimal assistance   tub/shower combo   ADL comments unable to use RUE to open/close car door      IADL   Prior Level of Function Light Housekeeping pt did some simple tasks prior (sister does most cleaning)    Prior Level of Function Meal Prep prior to first CVA pt cooked some    Meal Prep --   able to get drink, but no other snack/meal prep   Community Mobility Relies on family or friends for transportation   or [place]   Medication Management Takes responsibility if medication is prepared in advance in seperate dosage      Mobility   Mobility Status --   Mod I currently, but at fall risk--see PT eval   Mobility Status Comments pt demo difficulty picking up RLE      Written Expression   Dominant Hand Right    Handwriting 90% legible      Vision - History   Baseline Vision Wears glasses only for reading    Visual History Retinopathy      Vision Assessment   Vision Assessment --   to be assessed further in functional context prn   Comment 4M visual scanning/letter cancelation with approx 90-95% accuaracy (pt missed one line as he realized he was looking for wrong letter, 1 omission, and additional errors may be due to coordination R hand)      Cognition   Overall Cognitive Status Cognition to be further assessed in functional context PRN   pt able to follow directions for eval and answer questions appropriately and report some compensation strategies for ADLs (from previous speech therapy)   Cognition Comments appears to be close to level at last OT d/c, pt reports hx of learing difficulties and dyslexia      Sensation   Additional Comments pt  reports that he can't feel how much pressure he is using with RUE      Coordination   9 Hole Peg Test Right;Left    Right 9 Hole Peg Test 54.40   multiple drops   Left 9 Hole Peg Test 36.31  ROM / Strength   AROM / PROM / Strength AROM;Strength      AROM   Overall AROM  Deficits    Overall AROM Comments RUE:  grossly WNL except unable to fully oppose to digit 5.  LUE WNL      Strength   Overall Strength Deficits    Overall Strength Comments LUE proximal strength grossly 5/5    Strength Assessment Site Shoulder;Elbow    Right/Left Shoulder Right    Right Shoulder ABduction 3+/5    Right Shoulder Internal Rotation 3+/5    Right Shoulder Horizontal ABduction 4-/5    Right Shoulder Horizontal ADduction 4-/5    Right/Left Elbow Right    Right Elbow Flexion 4-/5    Right Elbow Extension 4/5      Hand Function   Right Hand Grip (lbs) 40.1    Left Hand Grip (lbs) 76                           OT Education - 11/23/20 1240    Education Details OT eval results and POC    Person(s) Educated Patient    Methods Explanation    Comprehension Verbalized understanding            OT Short Term Goals - 11/23/20 1310      OT SHORT TERM GOAL #1   Title Pt will be independent with HEP for RUE coordination and strength.--check STGs 12/21/20    Time 4    Period Weeks    Status New      OT SHORT TERM GOAL #2   Title Pt will perform bathing with supervision for transfer.    Time 4    Period Weeks    Status New      OT SHORT TERM GOAL #3   Title Pt will perform UB dressing mod I.    Time 4    Period Weeks    Status New      OT SHORT TERM GOAL #4   Title Pt will peform LB dressing with min A.    Time 4    Period Weeks    Status New      OT SHORT TERM GOAL #5   Title Pt will improve coordination for ADLs as shown by improving time on 9-hole peg test by at least 8sec with R hand.    Baseline 54.4sec    Time 4    Period Weeks    Status New              OT Long Term Goals - 11/23/20 1312      OT LONG TERM GOAL #1   Title Pt will perform simple snack prep and home maintenance tasks mod I.    Time 6    Period Weeks    Status New      OT LONG TERM GOAL #2   Title Pt will improve coordination for ADLs as shown by improving time on 9-hole peg test by at least 15sec with R hand.    Baseline 54.4sec    Time 6    Period Weeks    Status New      OT LONG TERM GOAL #3   Title Pt will improve R grip strength by at least 15lbs to assist in opening containers and lifting objects.    Baseline R-40.1, L-76lbs    Time 6    Period Weeks    Status New  OT LONG TERM GOAL #4   Title Pt will perform LB dressing mod I.    Time 6    Period Weeks    Status New      OT LONG TERM GOAL #5   Title Assess environmental scanning and establish goal prn.    Time 6    Period Weeks    Status New      OT LONG TERM GOAL #6   Title ---                 Plan - 11/23/20 1256    Clinical Impression Statement Pt is a 50 y.o. male who presents today with recent hospitalization (11/18/20-11/21/20) for multifocal cerebral infarcts with R sided weakness.  Pt was seen previously (d/c 11/05/20) for OT for CVA 05/2020 with L-sided deficits.  PMH also includes:  HTN, DM, sleep apnea, narcolepsy, hx of ETOH and cocaine abuse, CKD,  hyperkalemia, Paroxysmal Atrial fibrillation (s/p ablation in 2021).  Pt reports that R-side feels weaker and pt needs incr assist for ADLs since recent hospitalization.  Pt presents today with R hemiparesis with decr coordination, decr balance for ADLs.  (Cognition and vision to be assessed further in functional context).  Pt would benefit from occupational therapy to address these changes for improved ADL/IADL performance and improved RUE functional use.    OT Occupational Profile and History Problem Focused Assessment - Including review of records relating to presenting problem    Occupational performance deficits (Please refer to  evaluation for details): IADL's;ADL's;Leisure;Work    Games developerBody Structure / Function / Physical Skills IADL;Coordination;FMC;Decreased knowledge of precautions;UE functional use;ADL;Strength;Dexterity;Balance;Vision;Mobility;Sensation    Cognitive Skills Safety Awareness   pt reports long hx of learning difficulties and dyslexia   Rehab Potential Good    Clinical Decision Making Several treatment options, min-mod task modification necessary    Comorbidities Affecting Occupational Performance: May have comorbidities impacting occupational performance    Modification or Assistance to Complete Evaluation  Min-Moderate modification of tasks or assist with assess necessary to complete eval    OT Frequency 2x / week    OT Duration 6 weeks   +eval or 13 visits over extended weeks (due to potential scheduling conflicts)   OT Treatment/Interventions Therapeutic activities;Therapeutic exercise;Cognitive remediation/compensation;Neuromuscular education;Functional Mobility Training;Visual/perceptual remediation/compensation;Patient/family education;Self-care/ADL training;Moist Heat;Fluidtherapy;DME and/or AE instruction;Cryotherapy;Passive range of motion;Manual Therapy;Paraffin    Plan initiate HEP for RUE coordination and strength, assess environmental scanning and establish goal prn    Consulted and Agree with Plan of Care Patient           Patient will benefit from skilled therapeutic intervention in order to improve the following deficits and impairments:   Body Structure / Function / Physical Skills: IADL,Coordination,FMC,Decreased knowledge of precautions,UE functional use,ADL,Strength,Dexterity,Balance,Vision,Mobility,Sensation Cognitive Skills: Safety Awareness (pt reports long hx of learning difficulties and dyslexia)     Visit Diagnosis: Hemiplegia and hemiparesis following cerebral infarction affecting right dominant side (HCC)  Muscle weakness (generalized)  Unsteadiness on  feet  Visuospatial deficit  Other lack of coordination  Other symptoms and signs involving cognitive functions following cerebral infarction    Problem List Patient Active Problem List   Diagnosis Date Noted  . Cerebral thrombosis with cerebral infarction 11/20/2020  . Right sided weakness 11/19/2020  . Hyperkalemia 11/19/2020  . AKI (acute kidney injury) (HCC)   . Dysarthria   . Amnesia 09/08/2020  . 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 Acute cerebrovascular accident (CVA) (HCC) 06/01/2020  . Paronychia of right thumb 05/10/2020  .  Mediastinal adenopathy 01/06/2020  . Secondary hypercoagulable state (HCC) 10/22/2019  . Acute pancreatitis 01/22/2019  . Morbid obesity due to excess calories (HCC) 12/08/2016  . Paroxysmal atrial fibrillation (HCC) 01/23/2013  . Diabetes mellitus (HCC) 01/23/2013  . Hypertension 01/23/2013  . Tobacco user 01/23/2013  . Chronic diastolic heart failure (HCC) 01/21/2013  . 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 Shoulder pain, right 02/15/2011  . Seasonal and perennial allergic rhinitis 02/13/2008  . Dyslipidemia 02/12/2008  . Cocaine abuse (HCC) 02/12/2008  . Obstructive sleep apnea 02/12/2008    [facility] 11/23/2020, 1:16 PM  [facility] [address] [place], [place], [address] Phone: [phone]   Fax:  [phone]  Name: [name]. MRN: [id number] Date of Birth: 08/04/1971   Willa Frater, OTR/L [facility] [address] [place], [place]  [address] [phone] phone [phone] 11/23/20 1:17 PM

## 2020-11-24 ENCOUNTER — Encounter: Payer: Self-pay | Admitting: Internal Medicine

## 2020-11-24 ENCOUNTER — Telehealth: Payer: Self-pay | Admitting: Internal Medicine

## 2020-11-24 ENCOUNTER — Ambulatory Visit (INDEPENDENT_AMBULATORY_CARE_PROVIDER_SITE_OTHER): Payer: 59 | Admitting: Internal Medicine

## 2020-11-24 ENCOUNTER — Other Ambulatory Visit: Payer: Self-pay | Admitting: Internal Medicine

## 2020-11-24 VITALS — BP 128/82 | HR 83 | Temp 98.0°F | Wt 312.9 lb

## 2020-11-24 DIAGNOSIS — I639 Cerebral infarction, unspecified: Secondary | ICD-10-CM | POA: Diagnosis not present

## 2020-11-24 DIAGNOSIS — I10 Essential (primary) hypertension: Secondary | ICD-10-CM

## 2020-11-24 DIAGNOSIS — E875 Hyperkalemia: Secondary | ICD-10-CM

## 2020-11-24 DIAGNOSIS — I69398 Other sequelae of cerebral infarction: Secondary | ICD-10-CM

## 2020-11-24 DIAGNOSIS — R269 Unspecified abnormalities of gait and mobility: Secondary | ICD-10-CM

## 2020-11-24 NOTE — Telephone Encounter (Signed)
Thank you so much.  Lonia Blood, PT 11/24/20 3:10 PM Phone: [phone] Fax: [phone]

## 2020-11-24 NOTE — Patient Instructions (Addendum)
It was nice seeing you today! Thank you for choosing [facility] for your Primary Care.    Today we talked about:   1. We are re-checking your potassium levels today. I will call you with the results 2. Today we focused on going through your medications. Please call us if you have any questions.

## 2020-11-24 NOTE — Telephone Encounter (Signed)
ABsolutely, and thank you. Order in.

## 2020-11-24 NOTE — Assessment & Plan Note (Addendum)
[name] has been taking his Kayexalate twice daily without any difficulty.  He denies any palpitations.  Assessment/plan: At recent hospitalization, patient was discontinued off lisinopril and spironolactone, in addition to being started on Kayexalate in effort to improve hyperkalemia.  Unfortunately he did continue the lisinopril, however he does understand now to stop.  We will obtain BMP today for further evaluation and adjust medications accordingly.  -BMP pending

## 2020-11-24 NOTE — Assessment & Plan Note (Addendum)
BP: 128/82   [name] states he has been taking motor pain, chlorothiazide, metoprolol without difficulty.  He was not aware use discontinued from lisinopril and had continued taking that one as well.  He denies any chest pain, shortness of breath, dizziness, headaches  Assessment/plan: Blood pressure well controlled today.  Due to hyperkalemia, lisinopril was discontinued on hospital discharge and will discard that medication today.  -Continue amlodipine 10 mg daily and hydrochlorothiazide 25 mg daily -4-week follow-up

## 2020-11-24 NOTE — Progress Notes (Signed)
   CC: Hospital follow up; acute CVA  HPI:  [name] is a 50 y.o. with a PMHx as listed below who presents to the clinic for Hospital follow up; acute CVA.   Please see the Encounters tab for problem-based Assessment & Plan regarding status of patient's acute and chronic conditions.  Past Medical History:  Diagnosis Date  . Acute pancreatitis 01/22/2019  . Diabetes mellitus   . GERD (gastroesophageal reflux disease)   . History of alcohol abuse   . History of cocaine use   . History of echocardiogram    a. Echo 4/14: Moderate LVH, vigorous LVEF, EF 65-70%, normal wall motion, grade 2 diastolic dysfunction, mildly dilated aortic root and ascending aorta, ascending aorta 40 mm, aortic root 38 mm, mild LAE  . Hx of cardiovascular stress test    a. GXT 5/14: No ischemic changes  //  b. ETT-Myoview 3/16:  Low risk, no ischemia, EF 58%  . Hyperlipidemia   . Hypertension   . Morbid obesity (HCC)   . Paroxysmal atrial fibrillation (HCC)    Occurring in 2008, with several recurrence since then (including in the setting of + cocaine on UDS).  . Sleep apnea    uses CPAP  . SVT (supraventricular tachycardia) (HCC)    mid to long RP SVT 09/2019   Review of Systems: Review of Systems  Constitutional: Negative for chills, fever and weight loss.  Respiratory: Negative for cough, shortness of breath and wheezing.   Cardiovascular: Negative for chest pain and leg swelling.  Gastrointestinal: Negative for abdominal pain, diarrhea, nausea and vomiting.  Neurological: Positive for focal weakness (unchanged). Negative for dizziness and headaches.   Physical Exam:  Vitals:   11/24/20 1318  BP: 128/82  Pulse: 83  Temp: 98 F (36.7 C)  TempSrc: Oral  SpO2: 99%  Weight: (!) 312 lb 14.4 oz (141.9 kg)   Physical Exam Vitals and nursing note reviewed.  Constitutional:      General: He is not in acute distress.    Appearance: He is obese.  Cardiovascular:     Rate and  Rhythm: Normal rate and regular rhythm.     Heart sounds: No murmur heard. No gallop.   Pulmonary:     Effort: Pulmonary effort is normal. No respiratory distress.     Breath sounds: No wheezing or rales.  Musculoskeletal:     Right lower leg: No edema.     Left lower leg: No edema.  Neurological:     Mental Status: He is alert and oriented to person, place, and time. Mental status is at baseline.     Comments: Mild dysarthria  Psychiatric:        Mood and Affect: Mood normal.        Behavior: Behavior normal.    Assessment & Plan:   See Encounters Tab for problem based charting.  Patient discussed with Dr. Mayford Knife

## 2020-11-24 NOTE — Assessment & Plan Note (Signed)
[name] is here for a hospital follow-up from his recent admission for acute CVA.  He notes that he has been doing well and has not noticed any new neuro deficits.  He has taken aspirin daily, as well as apixaban, without any difficulty.  Assessment/plan: [name] has neurology follow-up on 1st March.  No medication changes made today.  -Continue aspirin -Continue risk factor modification

## 2020-11-24 NOTE — Telephone Encounter (Signed)
Spoke with [facility] on [address] who stated they had just gotten Adderall shipment in and would fill order for pt. Spoke with pt and notified of what [facility] stated and to inform us if he had any problems. Pt stated understanding. Nothing further needed at this time.

## 2020-11-25 ENCOUNTER — Other Ambulatory Visit: Payer: Self-pay

## 2020-11-25 ENCOUNTER — Ambulatory Visit: Payer: 59 | Admitting: Physical Therapy

## 2020-11-25 ENCOUNTER — Encounter: Payer: Self-pay | Admitting: Physical Therapy

## 2020-11-25 VITALS — BP 165/92 | HR 80

## 2020-11-25 DIAGNOSIS — R2681 Unsteadiness on feet: Secondary | ICD-10-CM

## 2020-11-25 DIAGNOSIS — R2689 Other abnormalities of gait and mobility: Secondary | ICD-10-CM

## 2020-11-25 DIAGNOSIS — M6281 Muscle weakness (generalized): Secondary | ICD-10-CM

## 2020-11-25 LAB — BMP8+ANION GAP
Anion Gap: 13 mmol/L (ref 10.0–18.0)
BUN/Creatinine Ratio: 15 (ref 9–20)
BUN: 19 mg/dL (ref 6–24)
CO2: 24 mmol/L (ref 20–29)
Calcium: 9.1 mg/dL (ref 8.7–10.2)
Chloride: 103 mmol/L (ref 96–106)
Creatinine, Ser: 1.3 mg/dL — ABNORMAL HIGH (ref 0.76–1.27)
GFR calc Af Amer: 74 mL/min/{1.73_m2} (ref >59)
GFR calc non Af Amer: 64 mL/min/{1.73_m2} (ref >59)
Glucose: 147 mg/dL — ABNORMAL HIGH (ref 65–99)
Potassium: 4.9 mmol/L (ref 3.5–5.2)
Sodium: 140 mmol/L (ref 134–144)

## 2020-11-25 NOTE — Therapy (Signed)
[facility] [address] [place], [place], [address] Phone: [phone]   Fax:  [phone]  Physical Therapy Treatment  Patient Details  Name: [name]. MRN: [id number] Date of Birth: 05-08-1971 Referring Provider (PT): Miguel Aschoff, MD   Encounter Date: 11/25/2020   PT End of Session - 11/25/20 0808    Visit Number 18    Number of Visits 32   per recert 11/22/2020, to include 2/7 visit   Authorization Type UHC-60 visit limit PT/OT/speech (each visit of each type counts); as of 11/22/2020, pt has had 11 TOTAL visits in 2022    Authorization - Visit Number 7   PT visits in 2022   Authorization - Number of Visits 20   divided 60/3 disciplines-may need to discuss with other discplines   PT Start Time 0805    PT Stop Time 0845    PT Time Calculation (min) 40 min    Equipment Utilized During Treatment Gait belt    Activity Tolerance Patient tolerated treatment well;Patient limited by fatigue    Behavior During Therapy WFL for tasks assessed/performed           Past Medical History:  Diagnosis Date  . Acute pancreatitis 01/22/2019  . Diabetes mellitus   . GERD (gastroesophageal reflux disease)   . History of alcohol abuse   . History of cocaine use   . History of echocardiogram    a. Echo 4/14: Moderate LVH, vigorous LVEF, EF 65-70%, normal wall motion, grade 2 diastolic dysfunction, mildly dilated aortic root and ascending aorta, ascending aorta 40 mm, aortic root 38 mm, mild LAE  . Hx of cardiovascular stress test    a. GXT 5/14: No ischemic changes  //  b. ETT-Myoview 3/16:  Low risk, no ischemia, EF 58%  . Hyperlipidemia   . Hypertension   . Morbid obesity (HCC)   . Paroxysmal atrial fibrillation (HCC)    Occurring in 2008, with several recurrence since then (including in the setting of + cocaine on UDS).  . Sleep apnea    uses CPAP  . SVT (supraventricular tachycardia) (HCC)    mid to long  RP SVT 09/2019    Past Surgical History:  Procedure Laterality Date  . APPENDECTOMY    . ATRIAL FIBRILLATION ABLATION N/A 11/27/2019   Procedure: ATRIAL FIBRILLATION ABLATION;  Surgeon: [name], MD;  [facility];  Service: Cardiovascular;  Laterality: N/A;  . ROTATOR CUFF REPAIR    . SVT ABLATION N/A 11/27/2019   Procedure: SVT ABLATION;  Surgeon: [name], MD;  Location: [facility];  Service: Cardiovascular;  Laterality: N/A;  . TONSILLECTOMY      Vitals:   11/25/20 0808  BP: (!) 165/92  Pulse: 80  SpO2: 97%     Subjective Assessment - 11/25/20 0808    Subjective No new complaints. No falls. Does not feel he needs a walker, "I walk just fine, morning take a while to get going".    Pertinent History Recent hospitalization 2/4-11/21/20 (Dr. Marvel Plan and RN at d/c recommend OP PT)    Patient Stated Goals Pt's goals for therapy are to get back to where I was with strength and coordination.  11/22/20:  To get back where I was before this stroke.    Currently in Pain? No/denies    Pain Score 0-No pain                [facility] Adult PT Treatment/Exercise -  11/25/20 0810      Transfers   Transfers Sit to Stand;Stand to Sit    Sit to Stand 6: Modified independent (Device/Increase time);Without upper extremity assist;From chair/3-in-1;5: Supervision    Stand to Sit 6: Modified independent (Device/Increase time);Without upper extremity assist;To chair/3-in-1;5: Supervision      Ambulation/Gait   Ambulation/Gait Yes    Ambulation/Gait Assistance 4: Min guard;5: Supervision    Ambulation/Gait Assistance Details Pt moslty supervision with gait with min guard assist at times due to right LE instability/walker tipping. less assistance/cues needed with second gait rep with only supervision required. Cues each time on posture and walker position with gait.    Ambulation Distance (Feet) 115 Feet   x2   Assistive device None;Rolling walker    Gait Pattern  Step-through pattern;Decreased step length - right;Decreased step length - left;Decreased stance time - right;Decreased weight shift to right;Trendelenburg;Narrow base of support    Ambulation Surface Level;Indoor      Exercises   Exercises Other Exercises    Other Exercises  reviewed and modificed HEP for LE strengthening. Refer to Medbridge for full details. Cues needed on correct ex form/technique.          Issued the following to HEP today:  Access Code: [email or web address] URL: [email or web address] Date: 11/25/2020 Prepared by: Sallyanne Kuster  Exercises Sit to Stand with Counter Support - 1 x daily - 5 x weekly - 1 sets - 10 reps Seated Knee Extension with Resistance - 1 x daily - 5 x weekly - 1 sets - 10 reps Seated Hamstring Curl with Anchored Resistance - 1 x daily - 5 x weekly - 1 sets - 10 reps Heel Toe Raises with Counter Support - 1 x daily - 5 x weekly - 1 sets - 10 reps   PT Education - 11/25/20 1328    Education Details benefit of use of RW with gait at this time; revised HEP for strengthening    Person(s) Educated Patient    Methods Explanation;Demonstration;Verbal cues;Handout    Comprehension Verbalized understanding;Returned demonstration;Verbal cues required;Need further instruction            PT Short Term Goals - 11/22/20 1956      PT SHORT TERM GOAL #1   Title Pt will be independent with revised HEP for improved strength, balance, gait.  TARGET 12/17/2020    Time 4    Period Weeks    Status Revised      PT SHORT TERM GOAL #2   Title Pt will improve TUG score to less than or equal to 18 seconds for decreased fall risk.    Baseline TUG 23.4 sec 11/22/20    Time 4    Period Weeks    Status New      PT SHORT TERM GOAL #3   Title Pt will improve Berg Balance score to at least 19/56 for decreased fall risk.    Baseline 9/56 11/22/20    Time 4    Period Weeks    Status New      PT SHORT TERM GOAL #4   Title Pt will improve 5x sit<>stand to less than or  equal to 18 seconds to demonstrate improved functional strength.    Baseline 24.82 11/22/20    Time 4    Status New      PT SHORT TERM GOAL #5   Title Pt will verbalize understanding of fall prevention in home environment.    Time 4    Period Weeks  Status New             PT Long Term Goals - 11/22/20 1959      PT LONG TERM GOAL #1   Title Pt will be independent with final progression of HEP for improved gait and balance.  TARGET 01/14/2021    Time 8    Period Weeks    Status Revised      PT LONG TERM GOAL #2   Title Pt will improve TUG score to less than or equal to 13.5 sec for decreased fall risk.    Time 8    Period Weeks    Status New      PT LONG TERM GOAL #3   Title Pt will improve Berg Balance score to at least 35/56 for decreased fall risk.    Time 8    Period Weeks    Status New      PT LONG TERM GOAL #4   Title Pt will improve 5x sit<>stand to less than or equal to 13 sec for improved functional lower extremity strength.    Time 8    Period Weeks    Status New      PT LONG TERM GOAL #5   Title Pt will improve gait velocity to at least 2 ft/sec for improved gait efficiency and decreased fall risk.    Baseline 0.96 ft/sec 11/22/20    Time 8    Period Weeks    Status New      Additional Long Term Goals   Additional Long Term Goals Yes      PT LONG TERM GOAL #6   Title Pt will ambulate at least 1000 ft, indoors and outdoors surfaces, using least restrictive assistive device, mod independently, no LOB, for improved community ambulation.    Time 8    Period Weeks    Status New                 Plan - 11/25/20 0809    Clinical Impression Statement Today's skilled session continued to focus on gait training with RW. Pt educated on use of RW for safety and energy conservation with pt verbalizing understanding. Remainder of session focused on review and updating HEP to address strengthening. Will need to add balance ex's back in that are appropriate for  pt's mobility level at this time. The pt is progressing toward goals and should benefit from continued PT to progress toward unmet goals.    Personal Factors and Comorbidities Comorbidity 3+    Comorbidities diabetes, HTN, morbid obesity, history of polysubstance abuse (cocaine and alcohol)    Examination-Activity Limitations Locomotion Level;Transfers;Stairs    Examination-Participation Restrictions Community Activity;Yard Work    Stability/Clinical Decision Making Evolving/Moderate complexity    Rehab Potential Good    PT Frequency 2x / week    PT Duration 8 weeks   including week of recert, 11/22/2020   PT Treatment/Interventions ADLs/Self Care Home Management;Gait training;Stair training;Functional mobility training;Therapeutic activities;Therapeutic exercise;Balance training;Neuromuscular re-education;Patient/family education;DME Instruction    PT Next Visit Plan continue with use of RW, gait training. Did MD order for RW come in? add to HEP balance ex's.    PT Home Exercise Plan Access Code: [email or web address]    Consulted and Agree with Plan of Care Patient           Patient will benefit from skilled therapeutic intervention in order to improve the following deficits and impairments:  Abnormal gait,Difficulty walking,Decreased balance,Decreased mobility  Visit Diagnosis: Muscle weakness (generalized)  Unsteadiness on feet  Other abnormalities of gait and mobility     Problem List Patient Active Problem List   Diagnosis Date Noted  . Cerebral thrombosis with cerebral infarction 11/20/2020  . Right sided weakness 11/19/2020  . Hyperkalemia 11/19/2020  . AKI (acute kidney injury) (HCC)   . Dysarthria   . Amnesia 09/08/2020  . 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 Acute cerebrovascular accident (CVA) (HCC) 06/01/2020  . Paronychia of right thumb 05/10/2020  . Mediastinal adenopathy 01/06/2020  . Secondary hypercoagulable state (HCC) 10/22/2019  . Morbid obesity  due to excess calories (HCC) 12/08/2016  . Paroxysmal atrial fibrillation (HCC) 01/23/2013  . Diabetes mellitus (HCC) 01/23/2013  . Hypertension 01/23/2013  . Tobacco user 01/23/2013  . Chronic diastolic heart failure (HCC) 01/21/2013  . 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 Shoulder pain, right 02/15/2011  . Seasonal and perennial allergic rhinitis 02/13/2008  . Dyslipidemia 02/12/2008  . Cocaine abuse (HCC) 02/12/2008  . Obstructive sleep apnea 02/12/2008    Sallyanne Kuster, PTA, [facility] [address], [address] [place], [place] [address] [phone] 11/25/20, 1:28 PM   Name: Erling Arrazola. MRN: [id number] Date of Birth: [birth year]

## 2020-11-25 NOTE — Patient Instructions (Signed)
Access Code: [email or web address] URL: [email or web address] Date: 11/25/2020 Prepared by: Sallyanne Kuster  Exercises Sit to Stand with Counter Support - 1 x daily - 5 x weekly - 1 sets - 10 reps Seated Knee Extension with Resistance - 1 x daily - 5 x weekly - 1 sets - 10 reps Seated Hamstring Curl with Anchored Resistance - 1 x daily - 5 x weekly - 1 sets - 10 reps Heel Toe Raises with Counter Support - 1 x daily - 5 x weekly - 1 sets - 10 reps

## 2020-11-26 NOTE — Progress Notes (Signed)
Internal Medicine Clinic Attending  Case discussed with Dr. Basaraba  At the time of the visit.  We reviewed the resident's history and exam and pertinent patient test results.  I agree with the assessment, diagnosis, and plan of care documented in the resident's note.  

## 2020-11-26 NOTE — Progress Notes (Signed)
Creatinine stable. Potassium improved marginally. I expect it will continue to improve now that he is off the Lisinopril. We discarded the prescription for him at his appointment.

## 2020-11-29 ENCOUNTER — Telehealth: Payer: Self-pay | Admitting: Student

## 2020-11-29 ENCOUNTER — Ambulatory Visit: Payer: 59 | Admitting: Physical Therapy

## 2020-11-29 DIAGNOSIS — R531 Weakness: Secondary | ICD-10-CM

## 2020-11-29 NOTE — Telephone Encounter (Signed)
I returned a phone call to patient about getting a walker. II left a voice mail message for the patient. The patient is working with PT on [address].I Left a message for Mauro Kaufmann the thearpist that is working with patient to see if it is safe for him to get this walker and what type is she recommending.

## 2020-11-29 NOTE — Telephone Encounter (Signed)
Patient called back. States walker at PT had wheels but not breaks or seat. Will send staff message to Laqueta Due to see what she recommends. Kinnie Feil, BSN, RN-BC

## 2020-11-29 NOTE — Telephone Encounter (Signed)
Pt calling to report that the [facility] was to send over a request for a walker.  The patient is unaware if it was for a 4 wheeled Environmental consultant or a Teaching laboratory technician.  Please call the patient back as the patient states no one has called him back.

## 2020-12-03 ENCOUNTER — Ambulatory Visit: Payer: 59 | Admitting: Physical Therapy

## 2020-12-03 ENCOUNTER — Ambulatory Visit: Payer: 59

## 2020-12-03 ENCOUNTER — Encounter: Payer: Self-pay | Admitting: Physical Therapy

## 2020-12-03 ENCOUNTER — Other Ambulatory Visit: Payer: Self-pay

## 2020-12-03 DIAGNOSIS — R471 Dysarthria and anarthria: Secondary | ICD-10-CM

## 2020-12-03 DIAGNOSIS — R41841 Cognitive communication deficit: Secondary | ICD-10-CM

## 2020-12-03 DIAGNOSIS — R2681 Unsteadiness on feet: Secondary | ICD-10-CM | POA: Diagnosis not present

## 2020-12-03 DIAGNOSIS — M6281 Muscle weakness (generalized): Secondary | ICD-10-CM

## 2020-12-03 DIAGNOSIS — R2689 Other abnormalities of gait and mobility: Secondary | ICD-10-CM

## 2020-12-03 NOTE — Telephone Encounter (Signed)
I sent  A community message to [facility] with order an message from PT who worked with the patient [name] C2/18/202212:08 PM

## 2020-12-03 NOTE — Telephone Encounter (Signed)
Good morning, At [name] PT visit today, we trialed both a standard rolling walker (RW) and a 4-wheeled rollator walker with a seat.  Based on trials in PT and the original recommendation, patient would benefit most from a standard rolling walker with 2 front wheels to assist with his gait stability and balance.  He had difficulty sequencing the 4-wheeled rollator brakes and tended to keep the rollator too far in front of him with gait.  A rolling walker would be the best option.  He is in agreement.  Could you please write an order for a 2-wheeled rolling walker for [name]?  Thank you, and I apologize for any confusion.  Sincerely, Lonia Blood, PT 12/03/20 10:14 AM Phone: [phone] Fax: [phone]

## 2020-12-03 NOTE — Progress Notes (Signed)
Internal Medicine Clinic Attending  Case discussed with Dr. Katsadouros  At the time of the visit.  We reviewed the resident's history and exam and pertinent patient test results.  I agree with the assessment, diagnosis, and plan of care documented in the resident's note.  

## 2020-12-03 NOTE — Telephone Encounter (Signed)
[place], Mamie C, [place]; [place], Tacey Ruiz; [place], [place]; [place], Elige Radon; [place], [place]; 1 other   received

## 2020-12-03 NOTE — Therapy (Signed)
[facility] [address] [place], [name], [address] Phone: [phone]   Fax:  [phone]  Speech Language Pathology Treatment  Patient Details  Name: [name]. MRN: [id number] Date of Birth: 08/29/1971 Referring Provider (SLP): Frann Rider, NP   Encounter Date: 12/03/2020   End of Session - 12/03/20 0924    Visit Number 7    Number of Visits 17    Date for SLP Re-Evaluation 12/31/20    SLP Start Time 0804    SLP Stop Time  0846    SLP Time Calculation (min) 42 min    Activity Tolerance Patient tolerated treatment well           Past Medical History:  Diagnosis Date  . Acute pancreatitis 01/22/2019  . Diabetes mellitus   . GERD (gastroesophageal reflux disease)   . History of alcohol abuse   . History of cocaine use   . History of echocardiogram    a. Echo 4/14: Moderate LVH, vigorous LVEF, EF 65-70%, normal wall motion, grade 2 diastolic dysfunction, mildly dilated aortic root and ascending aorta, ascending aorta 40 mm, aortic root 38 mm, mild LAE  . Hx of cardiovascular stress test    a. GXT 5/14: No ischemic changes  //  b. ETT-Myoview 3/16:  Low risk, no ischemia, EF 58%  . Hyperlipidemia   . Hypertension   . Morbid obesity ([place])   . Paroxysmal atrial fibrillation ([place])    Occurring in 2008, with several recurrence since then (including in the setting of + cocaine on UDS).  . Sleep apnea    uses CPAP  . SVT (supraventricular tachycardia) ([place])    mid to long RP SVT 09/2019    Past Surgical History:  Procedure Laterality Date  . APPENDECTOMY    . ATRIAL FIBRILLATION ABLATION N/A 11/27/2019   Procedure: ATRIAL FIBRILLATION ABLATION;  Surgeon: Thompson Grayer, MD;  Location: [facility];  Service: Cardiovascular;  Laterality: N/A;  . ROTATOR CUFF REPAIR    . SVT ABLATION N/A 11/27/2019   Procedure: SVT ABLATION;  Surgeon: Thompson Grayer, MD;  Location: [place];   Service: Cardiovascular;  Laterality: N/A;  . TONSILLECTOMY      There were no vitals filed for this visit.   Subjective Assessment - 12/03/20 0830    Subjective Pt's speech is slightly more dysarthric than previous sessions. "The Adderall is more expensive now that my insurance started over.Marland KitchenMarland KitchenI'm trying to free up the $40 to go get it."    Currently in Pain? No/denies                 ADULT SLP TREATMENT - 12/03/20 0001      Pain Assessment   Pain Assessment No/denies pain      Cognitive-Linquistic Treatment   Treatment focused on Dysarthria;Cognition    Skilled Treatment "The more I talk the sorrier(intelligibility) it gets." Pt's speech intelligibility and oral motor strength were reassessed today in light of pt's admission due to new CVA. Pt assessment is correct in that ntelligibility masked started out 95% but decr'd to 90-95% after 6-7 minutes. SLP assessed pt's oral motor skills and found that pt's rt labial and lingual musculature were more flaccid after current CVA. STG was returned to "ongoing" and STG and LTG were added for speech compensations/HEP.      Assessment / Recommendations / Plan   Plan Goals updated      Progression Toward Goals   Progression toward  goals Progressing toward goals            SLP Education - 12/03/20 [phone]    Education Details Consider filling Adderall prescription, needs to overarticulate    Person(s) Educated Patient    Methods Explanation;Demonstration;Verbal cues    Comprehension Verbalized understanding;Verbal cues required;Need further instruction            SLP Short Term Goals - 12/03/20 0927      SLP SHORT TERM GOAL #1   Title pt will tell SLP three ways to compensate for his memory in 3 sessions, with modifeid independence    Baseline 11-05-20, 11-08-20 (calendar), 12-03-20 (phone alarms for meds)    Status Achieved      SLP SHORT TERM GOAL #2   Title pt will demonstrate selective attention to simple-mod complex task  for 8 mintues in quiet environment in 2 sessions    Baseline 10-14-20, 11-05-20    Status Partially Met      SLP SHORT TERM GOAL #3   Title pt will demo speech compensations for intelligibility 180% in 10 minutes simple conversation    Time 2    Period Weeks    Status On-going   denies ongoing dysarthria     SLP SHORT TERM GOAL #4   Title pt will perform HEP for dysarthria with occasional min A for exaggeration of speech sounds    Time 2    Period Weeks    Status New            SLP Long Term Goals - 12/03/20 0929      SLP LONG TERM GOAL #1   Title pt will demo simple divided attention skills in functinoal cogntive communcation tasks in 3 sessions    Time 4    Period Weeks   or 17 visits, for all LTGs   Status On-going      SLP LONG TERM GOAL #2   Title pt will report improving swallow skills by telling SLP frequency of cough during liquid consumption is once/month    Time 4    Period Weeks    Status On-going      SLP LONG TERM GOAL #3   Title pt will use memory compensations for appointments, medications, exercise/homework tracking, and/or to-do lists, etc in 5 sessions    Baseline 11-05-20 (meds), 12-03-20    Time 4    Period Weeks    Status On-going            Plan - 12/03/20 0924    Clinical Impression Statement [name] presents today with cont'd mild cognitive communication deficit and new onset worsened dysarthria since most recent CVA. Pt reports dysphagia having resolved. SLP again encouraged pt to do fill Adderall prescription but pt states to SLP he is trying to get $ to do so. Pt would cont to benefit from skilled ST addressing cognition-communication (and/or pt filling Adderall  prescription), and new onset dysarthria since latest CVA, in order to return to workforce in some capacity and to better communicate with friends and family. Goals may change if pt's attention improves with pt begins taking his prescribed Adderall. Pt may also require objective swallow  exam during this therapy course - although it appears as if dysphagia has resolved spontaneously.    Speech Therapy Frequency 2x / week    Duration 8 weeks   or 17 sessions   Treatment/Interventions Aspiration precaution training;Pharyngeal strengthening exercises;Diet toleration management by SLP;Trials of upgraded texture/liquids;Cueing hierarchy;Cognitive reorganization;Internal/external aids;Patient/family education;Compensatory strategies;SLP instruction  and feedback;Functional tasks    Potential to Achieve Goals Good    Potential Considerations Previous level of function;Cooperation/participation level   hx of decr'd follow through of medical recommendations   Consulted and Agree with Plan of Care Patient           Patient will benefit from skilled therapeutic intervention in order to improve the following deficits and impairments:   Dysarthria and anarthria  Cognitive communication deficit    Problem List Patient Active Problem List   Diagnosis Date Noted  . Cerebral thrombosis with cerebral infarction 11/20/2020  . Right sided weakness 11/19/2020  . Hyperkalemia 11/19/2020  . AKI (acute kidney injury) ([place])   . Dysarthria   . Amnesia 09/08/2020  . 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 Acute cerebrovascular accident (CVA) ([place]) 06/01/2020  . Paronychia of right thumb 05/10/2020  . Mediastinal adenopathy 01/06/2020  . Secondary hypercoagulable state ([place]) 10/22/2019  . Morbid obesity due to excess calories ([place]) 12/08/2016  . Paroxysmal atrial fibrillation ([place]) 01/23/2013  . Diabetes mellitus ([place]) 01/23/2013  . Hypertension 01/23/2013  . Tobacco user 01/23/2013  . Chronic diastolic heart failure ([place]) 01/21/2013  . 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 Shoulder pain, right 02/15/2011  . Seasonal and perennial allergic rhinitis 02/13/2008  . Dyslipidemia 02/12/2008  . Cocaine abuse ([place]) 02/12/2008  . Obstructive sleep apnea 02/12/2008     [facility] ,[place], CCC-SLP  12/03/2020, 9:30 AM  [facility] [address] [place], [name], [address] Phone: [phone]   Fax:  [phone]   Name: [name]. MRN: [id number] Date of Birth: 11-29-1970

## 2020-12-03 NOTE — Telephone Encounter (Signed)
Yes indeed, order placed.

## 2020-12-03 NOTE — Patient Instructions (Addendum)
Medical supply stores: [facility] on [facility] [phone]  [facility]  [facility] on [address] [place] [phone]   You will go to one of these places to purchase a rolling walker.  You will have the doctor's script (that I am requesting today) and then you will file the receipt and script with your insurance to reimburse you.  Also-could check with family members, [employer] for rolling walker (front 2 wheels only) in good repair.

## 2020-12-03 NOTE — Therapy (Signed)
[facility] [address] [place], [place], [address] Phone: [phone]   Fax:  [phone]  Physical Therapy Treatment  Patient Details  Name: [name]. MRN: [id number] Date of Birth: 01-Apr-1971 Referring Provider (PT): Miguel Aschoff, MD   Encounter Date: 12/03/2020   PT End of Session - 12/03/20 0855    Visit Number 19    Number of Visits 32   per recert 11/22/2020, to include 2/7 visit   Authorization Type U[employer]-60 visit limit PT/OT/speech (each visit of each type counts); as of 11/22/2020, pt has had 11 TOTAL visits in 2022    Authorization - Visit Number 8   PT visits in 2022   Authorization - Number of Visits 20   divided 60/3 disciplines-may need to discuss with other discplines   PT Start Time 0850    PT Stop Time 0933    PT Time Calculation (min) 43 min    Equipment Utilized During Treatment Gait belt    Activity Tolerance Patient tolerated treatment well    Behavior During Therapy WFL for tasks assessed/performed           Past Medical History:  Diagnosis Date  . Acute pancreatitis 01/22/2019  . Diabetes mellitus   . GERD (gastroesophageal reflux disease)   . History of alcohol abuse   . History of cocaine use   . History of echocardiogram    a. Echo 4/14: Moderate LVH, vigorous LVEF, EF 65-70%, normal wall motion, grade 2 diastolic dysfunction, mildly dilated aortic root and ascending aorta, ascending aorta 40 mm, aortic root 38 mm, mild LAE  . Hx of cardiovascular stress test    a. GXT 5/14: No ischemic changes  //  b. ETT-Myoview 3/16:  Low risk, no ischemia, EF 58%  . Hyperlipidemia   . Hypertension   . Morbid obesity (HCC)   . Paroxysmal atrial fibrillation (HCC)    Occurring in 2008, with several recurrence since then (including in the setting of + cocaine on UDS).  . Sleep apnea    uses CPAP  . 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 APPENDECTOMY    . ATRIAL FIBRILLATION ABLATION N/A 11/27/2019   Procedure: ATRIAL FIBRILLATION ABLATION;  Surgeon: [name], MD;  [facility]: [facility];  Service: Cardiovascular;  Laterality: N/A;  . ROTATOR CUFF REPAIR    . SVT ABLATION N/A 11/27/2019   Procedure: SVT ABLATION;  Surgeon: [name], MD;  Location: [facility];  Service: Cardiovascular;  Laterality: N/A;  . TONSILLECTOMY      There were no vitals filed for this visit.   Subjective Assessment - 12/03/20 0853    Subjective Nothing new.  Want to ask about the walker. Has had one fall in the past week.  Stepping up into the truck using the right leg, and it gave way.Tried to do some of the exercises.    Pertinent History Recent hospitalization 2/4-11/21/20 (Dr. Marvel Plan and RN at d/c recommend OP PT)    Patient Stated Goals Pt's goals for therapy are to get back to where I was with strength and coordination.  11/22/20:  To get back where I was before this stroke.    Currently in Pain? No/denies  [facility] Adult PT Treatment/Exercise - 12/03/20 0001      Ambulation/Gait   Ambulation/Gait Yes    Ambulation/Gait Assistance 4: Min guard;5: Supervision    Ambulation/Gait Assistance Details Gait in and out of therapy session with no device, 100 ft x 2, with supervision, wide BOS, lateral dip on R side.    Ambulation Distance (Feet) 190 Feet   with RW;  200 ft with rollator   Assistive device None;Rolling walker;Rollator    Gait Pattern Step-through pattern;Decreased step length - right;Decreased step length - left;Decreased stance time - right;Decreased weight shift to right;Trendelenburg;Narrow base of support    Ambulation Surface Level;Indoor    Ramp 4: Min assist   and cues for posture, foot clearance   Curb 5: Supervision;Other (comment)   cues for technique   Curb Details (indicate cue type and reason) Performed x  2, with VCs for correct sequence    Pre-Gait Activities With use of RW, PT provides cues to lessen UE support, to maintain upright posture and increased step length, as normal walking pattern, just with light UE support for stability at walker.    Gait Comments Discussed rationale behind initial recommendation to MD on 11/23/2019, for rolling walker, due to pt's high fall risk on balance measures from acute CVA.  Discussed options for walkers (order currently requesing 4-wheeled RW with seat) and trialed both RW and rollator today.  PT feels after trials of walkers today that pt would be best suited for standard rolling walker, as this would ultimately be safe option for additional stability in the short term as pt's balance and strength improves.  With trial of rollator, pt tends to keep it too far out in front and has difficulty sequencing the brakes to use the seat for a rest break.  Feel that the rollator is not the best option for him, so PT will follow up with MD about correcting the script to request rolling walker.  Provided pt with list of DME stores in [place], and pt may ask family members to borrow a RW as well.                  PT Education - 12/03/20 1003    Education Details Rationale/benefits of using rolling walker at this time; recommend rolling walker (vs. rollator) and PT to f/u with MD for appropriate order; how to obtain RW    Person(s) Educated Patient    Methods Explanation;Handout    Comprehension Other (comment);Verbalized understanding            PT Short Term Goals - 11/22/20 1956      PT SHORT TERM GOAL #1   Title Pt will be independent with revised HEP for improved strength, balance, gait.  TARGET 12/17/2020    Time 4    Period Weeks    Status Revised      PT SHORT TERM GOAL #2   Title Pt will improve TUG score to less than or equal to 18 seconds for decreased fall risk.    Baseline TUG 23.4 sec 11/22/20    Time 4    Period Weeks    Status New      PT  SHORT TERM GOAL #3   Title Pt will improve Berg Balance score to at least 19/56 for decreased fall risk.    Baseline 9/56 11/22/20    Time 4    Period Weeks    Status New      PT SHORT TERM GOAL #4  Title Pt will improve 5x sit<>stand to less than or equal to 18 seconds to demonstrate improved functional strength.    Baseline 24.82 11/22/20    Time 4    Status New      PT SHORT TERM GOAL #5   Title Pt will verbalize understanding of fall prevention in home environment.    Time 4    Period Weeks    Status New             PT Long Term Goals - 11/22/20 1959      PT LONG TERM GOAL #1   Title Pt will be independent with final progression of HEP for improved gait and balance.  TARGET 01/14/2021    Time 8    Period Weeks    Status Revised      PT LONG TERM GOAL #2   Title Pt will improve TUG score to less than or equal to 13.5 sec for decreased fall risk.    Time 8    Period Weeks    Status New      PT LONG TERM GOAL #3   Title Pt will improve Berg Balance score to at least 35/56 for decreased fall risk.    Time 8    Period Weeks    Status New      PT LONG TERM GOAL #4   Title Pt will improve 5x sit<>stand to less than or equal to 13 sec for improved functional lower extremity strength.    Time 8    Period Weeks    Status New      PT LONG TERM GOAL #5   Title Pt will improve gait velocity to at least 2 ft/sec for improved gait efficiency and decreased fall risk.    Baseline 0.96 ft/sec 11/22/20    Time 8    Period Weeks    Status New      Additional Long Term Goals   Additional Long Term Goals Yes      PT LONG TERM GOAL #6   Title Pt will ambulate at least 1000 ft, indoors and outdoors surfaces, using least restrictive assistive device, mod independently, no LOB, for improved community ambulation.    Time 8    Period Weeks    Status New                 Plan - 12/03/20 1005    Clinical Impression Statement Skilled PT session focused on trials of RW versus  rollator (as MD scipt was for rollator/PT had requested rolling walker).  Educated pt on rationale of using walker in general and trialed the two; ultimately, PT recommends, and pt agrees on standard rolling walker as best option for safety with gait at this time.  Pt needs cues for techniqeu and sequence with curb and ramp training with RW and will need additional training.    Personal Factors and Comorbidities Comorbidity 3+    Comorbidities diabetes, HTN, morbid obesity, history of polysubstance abuse (cocaine and alcohol)    Examination-Activity Limitations Locomotion Level;Transfers;Stairs    Examination-Participation Restrictions Community Activity;Yard Work    Stability/Clinical Decision Making Evolving/Moderate complexity    Rehab Potential Good    PT Frequency 2x / week    PT Duration 8 weeks   including week of recert, 11/22/2020   PT Treatment/Interventions ADLs/Self Care Home Management;Gait training;Stair training;Functional mobility training;Therapeutic activities;Therapeutic exercise;Balance training;Neuromuscular re-education;Patient/family education;DME Instruction    PT Next Visit Plan Check on MD order for rolling walker (  or was pt able to obtain one on his own?); gait training with RW-curb, ramp, outdoors.  Review HEP from last visit; conitnue to work on RLE Economist.  Please check vitals  (make sure pt has more appts; PT requested 4 additional weeks beyond week of 2/21; we will need to monitor visit number for him, due to VL with insurance, now that he is back to OT and speech)    PT Home Exercise Plan Access Code: [email or web address]    Consulted and Agree with Plan of Care Patient           Patient will benefit from skilled therapeutic intervention in order to improve the following deficits and impairments:  Abnormal gait,Difficulty walking,Decreased balance,Decreased mobility  Visit Diagnosis: Other abnormalities of gait and mobility  Unsteadiness on feet  Muscle  weakness (generalized)     Problem List Patient Active Problem List   Diagnosis Date Noted  . Cerebral thrombosis with cerebral infarction 11/20/2020  . Right sided weakness 11/19/2020  . Hyperkalemia 11/19/2020  . AKI (acute kidney injury) (HCC)   . Dysarthria   . Amnesia 09/08/2020  . 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 Acute cerebrovascular accident (CVA) (HCC) 06/01/2020  . Paronychia of right thumb 05/10/2020  . Mediastinal adenopathy 01/06/2020  . Secondary hypercoagulable state (HCC) 10/22/2019  . Morbid obesity due to excess calories (HCC) 12/08/2016  . Paroxysmal atrial fibrillation (HCC) 01/23/2013  . Diabetes mellitus (HCC) 01/23/2013  . Hypertension 01/23/2013  . Tobacco user 01/23/2013  . Chronic diastolic heart failure (HCC) 01/21/2013  . 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 Shoulder pain, right 02/15/2011  . Seasonal and perennial allergic rhinitis 02/13/2008  . Dyslipidemia 02/12/2008  . Cocaine abuse (HCC) 02/12/2008  . Obstructive sleep apnea 02/12/2008    Marcelline Temkin W. 12/03/2020, 10:09 AM Gean Maidens., PT  [facility] [address] [place], [place], [address] Phone: [phone]   Fax:  [phone]  Name: [name]. MRN: [id number] Date of Birth: [birth year]

## 2020-12-05 ENCOUNTER — Encounter (HOSPITAL_BASED_OUTPATIENT_CLINIC_OR_DEPARTMENT_OTHER): Payer: 59 | Admitting: Internal Medicine

## 2020-12-08 ENCOUNTER — Ambulatory Visit: Payer: 59

## 2020-12-08 ENCOUNTER — Ambulatory Visit: Payer: 59 | Admitting: Physical Therapy

## 2020-12-08 ENCOUNTER — Ambulatory Visit: Payer: 59 | Admitting: Occupational Therapy

## 2020-12-08 ENCOUNTER — Encounter: Payer: Self-pay | Admitting: Occupational Therapy

## 2020-12-08 ENCOUNTER — Other Ambulatory Visit: Payer: Self-pay

## 2020-12-08 ENCOUNTER — Encounter: Payer: Self-pay | Admitting: Physical Therapy

## 2020-12-08 VITALS — BP 141/88

## 2020-12-08 VITALS — BP 155/91 | HR 69

## 2020-12-08 DIAGNOSIS — I69318 Other symptoms and signs involving cognitive functions following cerebral infarction: Secondary | ICD-10-CM

## 2020-12-08 DIAGNOSIS — M6281 Muscle weakness (generalized): Secondary | ICD-10-CM

## 2020-12-08 DIAGNOSIS — R471 Dysarthria and anarthria: Secondary | ICD-10-CM

## 2020-12-08 DIAGNOSIS — R2681 Unsteadiness on feet: Secondary | ICD-10-CM | POA: Diagnosis not present

## 2020-12-08 DIAGNOSIS — R2689 Other abnormalities of gait and mobility: Secondary | ICD-10-CM

## 2020-12-08 DIAGNOSIS — I69351 Hemiplegia and hemiparesis following cerebral infarction affecting right dominant side: Secondary | ICD-10-CM

## 2020-12-08 DIAGNOSIS — R278 Other lack of coordination: Secondary | ICD-10-CM

## 2020-12-08 DIAGNOSIS — R41842 Visuospatial deficit: Secondary | ICD-10-CM

## 2020-12-08 DIAGNOSIS — R41841 Cognitive communication deficit: Secondary | ICD-10-CM

## 2020-12-08 NOTE — Therapy (Signed)
[facility] [address] [place], [place], [address] Phone: [phone]   Fax:  [phone]  Physical Therapy Treatment  Patient Details  Name: [name]. MRN: [id number] Date of Birth: 16-Dec-1970 Referring Provider (PT): Miguel Aschoff, MD   Encounter Date: 12/08/2020   PT End of Session - 12/08/20 0854    Visit Number 20    Number of Visits 32   per recert 11/22/2020, to include 2/7 visit   Authorization Type UHC-60 visit limit PT/OT/speech (each visit of each type counts); as of 11/22/2020, pt has had 11 TOTAL visits in 2022    Authorization - Visit Number 9   PT visits in 2022   Authorization - Number of Visits 20   divided 60/3 disciplines-may need to discuss with other discplines   PT Start Time 0848    PT Stop Time 0930    PT Time Calculation (min) 42 min    Equipment Utilized During Treatment Gait belt    Activity Tolerance Patient tolerated treatment well    Behavior During Therapy WFL for tasks assessed/performed           Past Medical History:  Diagnosis Date  . Acute pancreatitis 01/22/2019  . Diabetes mellitus   . GERD (gastroesophageal reflux disease)   . History of alcohol abuse   . History of cocaine use   . History of echocardiogram    a. Echo 4/14: Moderate LVH, vigorous LVEF, EF 65-70%, normal wall motion, grade 2 diastolic dysfunction, mildly dilated aortic root and ascending aorta, ascending aorta 40 mm, aortic root 38 mm, mild LAE  . Hx of cardiovascular stress test    a. GXT 5/14: No ischemic changes  //  b. ETT-Myoview 3/16:  Low risk, no ischemia, EF 58%  . Hyperlipidemia   . Hypertension   . Morbid obesity (HCC)   . Paroxysmal atrial fibrillation (HCC)    Occurring in 2008, with several recurrence since then (including in the setting of + cocaine on UDS).  . Sleep apnea    uses CPAP  . 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 APPENDECTOMY    . ATRIAL FIBRILLATION ABLATION N/A 11/27/2019   Procedure: ATRIAL FIBRILLATION ABLATION;  Surgeon: [name], MD;  [facility]: [facility];  Service: Cardiovascular;  Laterality: N/A;  . ROTATOR CUFF REPAIR    . SVT ABLATION N/A 11/27/2019   Procedure: SVT ABLATION;  Surgeon: [name], MD;  Location: [facility];  Service: Cardiovascular;  Laterality: N/A;  . TONSILLECTOMY      Vitals:   12/08/20 0848 12/08/20 0906  BP: (!) 143/93 (!) 155/91  Pulse: 69      Subjective Assessment - 12/08/20 0848    Subjective No new complaints. No falls. Reports his right LE still gets weak and wants to give way at times. Suppossed to get his walker Friday, [employer] is to deliver it to his house.    Pertinent History Recent hospitalization 2/4-11/21/20 (Dr. Marvel Plan and RN at d/c recommend OP PT)    Patient Stated Goals Pt's goals for therapy are to get back to where I was with strength and coordination.  11/22/20:  To get back where I was before this stroke.    Currently in Pain? No/denies    Pain Score 0-No pain  [facility] Adult PT Treatment/Exercise - 12/08/20 0859      Transfers   Transfers Sit to Stand;Stand to Sit    Sit to Stand 6: Modified independent (Device/Increase time);Without upper extremity assist;From chair/3-in-1;5: Supervision    Stand to Sit 6: Modified independent (Device/Increase time);Without upper extremity assist;To chair/3-in-1;5: Supervision      Ambulation/Gait   Ambulation/Gait Yes    Ambulation/Gait Assistance 5: Supervision;4: Min guard    Ambulation/Gait Assistance Details with first gait rep pt noted to have foot drag/toe scuffing with swing phase. Increased knee instability noted as well as distance increased. therefore did a trial of braces with last two gait reps- foot up brace on first rep with no significant improvement noted, then anterior ottobock brace with improved  foot clearance with occaisional recurvatum .Pt also reported feeling as if the brace rolled his ankle out. May benefit from lateral strut vs the medial strut and possibly a heel wedge. Will need to further assess this with gait.    Ambulation Distance (Feet) 230 Feet   x1, 115 x2   Assistive device Rolling walker;None    Gait Pattern Step-through pattern;Decreased step length - right;Decreased step length - left;Decreased stance time - right;Decreased weight shift to right;Trendelenburg;Narrow base of support;Decreased dorsiflexion - right    Ambulation Surface Level;Indoor                PT Short Term Goals - 11/22/20 1956      PT SHORT TERM GOAL #1   Title Pt will be independent with revised HEP for improved strength, balance, gait.  TARGET 12/17/2020    Time 4    Period Weeks    Status Revised      PT SHORT TERM GOAL #2   Title Pt will improve TUG score to less than or equal to 18 seconds for decreased fall risk.    Baseline TUG 23.4 sec 11/22/20    Time 4    Period Weeks    Status New      PT SHORT TERM GOAL #3   Title Pt will improve Berg Balance score to at least 19/56 for decreased fall risk.    Baseline 9/56 11/22/20    Time 4    Period Weeks    Status New      PT SHORT TERM GOAL #4   Title Pt will improve 5x sit<>stand to less than or equal to 18 seconds to demonstrate improved functional strength.    Baseline 24.82 11/22/20    Time 4    Status New      PT SHORT TERM GOAL #5   Title Pt will verbalize understanding of fall prevention in home environment.    Time 4    Period Weeks    Status New             PT Long Term Goals - 11/22/20 1959      PT LONG TERM GOAL #1   Title Pt will be independent with final progression of HEP for improved gait and balance.  TARGET 01/14/2021    Time 8    Period Weeks    Status Revised      PT LONG TERM GOAL #2   Title Pt will improve TUG score to less than or equal to 13.5 sec for decreased fall risk.    Time 8    Period  Weeks    Status New      PT LONG TERM GOAL #3   Title Pt will improve  Berg Balance score to at least 35/56 for decreased fall risk.    Time 8    Period Weeks    Status New      PT LONG TERM GOAL #4   Title Pt will improve 5x sit<>stand to less than or equal to 13 sec for improved functional lower extremity strength.    Time 8    Period Weeks    Status New      PT LONG TERM GOAL #5   Title Pt will improve gait velocity to at least 2 ft/sec for improved gait efficiency and decreased fall risk.    Baseline 0.96 ft/sec 11/22/20    Time 8    Period Weeks    Status New      Additional Long Term Goals   Additional Long Term Goals Yes      PT LONG TERM GOAL #6   Title Pt will ambulate at least 1000 ft, indoors and outdoors surfaces, using least restrictive assistive device, mod independently, no LOB, for improved community ambulation.    Time 8    Period Weeks    Status New                 Plan - 12/08/20 0854    Clinical Impression Statement Today's skilled session continued to focus on gait with RW and began to look at use of various orthotics for improved right foot clearance and stability with gait. Will need to continue to look at other braces as neither one used today was sufficient/comfortable for pt to use. The pt is progressing toward goals and should benefit from continued PT to progress toward unmet goals.    Personal Factors and Comorbidities Comorbidity 3+    Comorbidities diabetes, HTN, morbid obesity, history of polysubstance abuse (cocaine and alcohol)    Examination-Activity Limitations Locomotion Level;Transfers;Stairs    Examination-Participation Restrictions Community Activity;Yard Work    Stability/Clinical Decision Making Evolving/Moderate complexity    Rehab Potential Good    PT Frequency 2x / week    PT Duration 8 weeks   including week of recert, 11/22/2020   PT Treatment/Interventions ADLs/Self Care Home Management;Gait training;Stair  training;Functional mobility training;Therapeutic activities;Therapeutic exercise;Balance training;Neuromuscular re-education;Patient/family education;DME Instruction    PT Next Visit Plan Check on MD order for rolling walker (or was pt able to obtain one on his own?); gait training with RW-curb, ramp, outdoors.  Review HEP from last visit; conitnue to work on RLE Economiststrenghtening and safety.  Please check vitals  (make sure pt has more appts; PT requested 4 additional weeks beyond week of 2/21; we will need to monitor visit number for him, due to VL with insurance, now that he is back to OT and speech). continue with trials of braces for right LE.    PT Home Exercise Plan Access Code: [email or web address]    Consulted and Agree with Plan of Care Patient           Patient will benefit from skilled therapeutic intervention in order to improve the following deficits and impairments:  Abnormal gait,Difficulty walking,Decreased balance,Decreased mobility  Visit Diagnosis: Other abnormalities of gait and mobility  Unsteadiness on feet  Muscle weakness (generalized)     Problem List Patient Active Problem List   Diagnosis Date Noted  . Cerebral thrombosis with cerebral infarction 11/20/2020  . Right sided weakness 11/19/2020  . Hyperkalemia 11/19/2020  . AKI (acute kidney injury) (HCC)   . Dysarthria   . Amnesia 09/08/2020  . Cerebral embolism with cerebral infarction  06/01/2020  . Acute cerebrovascular accident (CVA) (HCC) 06/01/2020  . Paronychia of right thumb 05/10/2020  . Mediastinal adenopathy 01/06/2020  . Secondary hypercoagulable state (HCC) 10/22/2019  . Morbid obesity due to excess calories (HCC) 12/08/2016  . Paroxysmal atrial fibrillation (HCC) 01/23/2013  . Diabetes mellitus (HCC) 01/23/2013  . Hypertension 01/23/2013  . Tobacco user 01/23/2013  . Chronic diastolic heart failure (HCC) 01/21/2013  . 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 Shoulder pain, right 02/15/2011  . Seasonal  and perennial allergic rhinitis 02/13/2008  . Dyslipidemia 02/12/2008  . Cocaine abuse (HCC) 02/12/2008  . Obstructive sleep apnea 02/12/2008    Sallyanne Kuster, PTA, [facility] [address] [place], [place] [address] [phone] 12/09/20, 2:39 PM   Name: [name]. MRN: [id number] Date of Birth: 1971/04/15

## 2020-12-08 NOTE — Therapy (Signed)
[facility] [address] [place], [place], [address] Phone: [phone]   Fax:  [phone]  Occupational Therapy Treatment  Patient Details  Name: [name]. MRN: [id number] Date of Birth: 17-Dec-1970 Referring Provider (OT): Dr. Shanon Brow   Encounter Date: 12/08/2020   OT End of Session - 12/08/20 1241    Visit Number 2    Number of Visits 13    Date for OT Re-Evaluation 01/07/21    Authorization Type [employer]--copay per day, VL:60 each PT/OT/ST.   (3 previous OT visits used this year)    Authorization - Visit Number 14    Authorization - Number of Visits 60    OT Start Time 0935    OT Stop Time 1015    OT Time Calculation (min) 40 min    Activity Tolerance Patient tolerated treatment well    Behavior During Therapy WFL for tasks assessed/performed           Past Medical History:  Diagnosis Date  . Acute pancreatitis 01/22/2019  . Diabetes mellitus   . GERD (gastroesophageal reflux disease)   . History of alcohol abuse   . History of cocaine use   . History of echocardiogram    a. Echo 4/14: Moderate LVH, vigorous LVEF, EF 65-70%, normal wall motion, grade 2 diastolic dysfunction, mildly dilated aortic root and ascending aorta, ascending aorta 40 mm, aortic root 38 mm, mild LAE  . Hx of cardiovascular stress test    a. GXT 5/14: No ischemic changes  //  b. ETT-Myoview 3/16:  Low risk, no ischemia, EF 58%  . Hyperlipidemia   . Hypertension   . Morbid obesity (HCC)   . Paroxysmal atrial fibrillation (HCC)    Occurring in 2008, with several recurrence since then (including in the setting of + cocaine on UDS).  . Sleep apnea    uses CPAP  . SVT (supraventricular tachycardia) (HCC)    mid to long RP SVT 09/2019    Past Surgical History:  Procedure Laterality Date  . APPENDECTOMY    . ATRIAL FIBRILLATION ABLATION N/A 11/27/2019   Procedure: ATRIAL FIBRILLATION ABLATION;  Surgeon: [name], MD;  Location: [facility];  Service: Cardiovascular;  Laterality: N/A;  . ROTATOR CUFF REPAIR    . SVT ABLATION N/A 11/27/2019   Procedure: SVT ABLATION;  Surgeon: [name], MD;  Location: [facility];  Service: Cardiovascular;  Laterality: N/A;  . TONSILLECTOMY      Vitals:   12/08/20 1000  BP: (!) 141/88     Subjective Assessment - 12/08/20 0935    Subjective  Denies pain    Pertinent History Acute multifocal cerebral infarcts with R-sided weakness 11/18/20.    PMH: HTN, DM, sleep apnea, narcolepsy, hx of ETOH and cocaine abuse, CKD,  hyperkalemia, Paroxysmal Atrial fibrillation (s/p ablation in 2021), hx of CVA 06/01/20 with L-sided deficits    Limitations no heavy lifting    Patient Stated Goals improve R-sided strength    Currently in Pain? No/denies                   Environmental scanning 14/16 items located on first pass, pt located remainder on second pass.             OT Education - 12/08/20 1249    Education Details coordinatiion HEP    Person(s) Educated Patient    Methods Explanation;Demonstration    Comprehension Verbalized understanding;Returned demonstration;Verbal cues required  OT Short Term Goals - 11/23/20 1310      OT SHORT TERM GOAL #1   Title Pt will be independent with HEP for RUE coordination and strength.--check STGs 12/21/20    Time 4    Period Weeks    Status New      OT SHORT TERM GOAL #2   Title Pt will perform bathing with supervision for transfer.    Time 4    Period Weeks    Status New      OT SHORT TERM GOAL #3   Title Pt will perform UB dressing mod I.    Time 4    Period Weeks    Status New      OT SHORT TERM GOAL #4   Title Pt will peform LB dressing with min A.    Time 4    Period Weeks    Status New      OT SHORT TERM GOAL #5   Title Pt will improve coordination for ADLs as shown by improving time on 9-hole peg test by at least 8sec with R hand.    Baseline 54.4sec     Time 4    Period Weeks    Status New             OT Long Term Goals - 12/08/20 1236      OT LONG TERM GOAL #1   Title Pt will perform simple snack prep and home maintenance tasks mod I.    Time 6    Period Weeks    Status New      OT LONG TERM GOAL #2   Title Pt will improve coordination for ADLs as shown by improving time on 9-hole peg test by at least 15sec with R hand.    Baseline 54.4sec    Time 6    Period Weeks    Status New      OT LONG TERM GOAL #3   Title Pt will improve R grip strength by at least 15lbs to assist in opening containers and lifting objects.    Baseline R-40.1, L-76lbs    Time 6    Period Weeks    Status New      OT LONG TERM GOAL #4   Title Pt will perform LB dressing mod I.    Time 6    Period Weeks    Status New      OT LONG TERM GOAL #5   Title Pt wil perform envirnonmental scanning with 90% or better accuracy    Baseline 14/16 , 87% accuracy located first pass, for basic    Time 6    Period Weeks    Status New      OT LONG TERM GOAL #6   Title ---                 Plan - 12/08/20 1234    Clinical Impression Statement Pt is progressing towards goals. He demonstrates understanding of HEP.    OT Occupational Profile and History Problem Focused Assessment - Including review of records relating to presenting problem    Occupational performance deficits (Please refer to evaluation for details): IADL's;ADL's;Leisure;Work    Games developer / Function / Physical Skills IADL;Coordination;FMC;Decreased knowledge of precautions;UE functional use;ADL;Strength;Dexterity;Balance;Vision;Mobility;Sensation    Cognitive Skills Safety Awareness   pt reports long hx of learning difficulties and dyslexia   Rehab Potential Good    Clinical Decision Making Several treatment options, min-mod task modification necessary  Comorbidities Affecting Occupational Performance: May have comorbidities impacting occupational performance    Modification  or Assistance to Complete Evaluation  Min-Moderate modification of tasks or assist with assess necessary to complete eval    OT Frequency 2x / week    OT Duration 6 weeks   +eval or 13 visits over extended weeks (due to potential scheduling conflicts)   OT Treatment/Interventions Therapeutic activities;Therapeutic exercise;Cognitive remediation/compensation;Neuromuscular education;Functional Mobility Training;Visual/perceptual remediation/compensation;Patient/family education;Self-care/ADL training;Moist Heat;Fluidtherapy;DME and/or AE instruction;Cryotherapy;Passive range of motion;Manual Therapy;Paraffin    Plan initiate HEP for strength, work towards goals    Consulted and Agree with Plan of Care Patient           Patient will benefit from skilled therapeutic intervention in order to improve the following deficits and impairments:   Body Structure / Function / Physical Skills: IADL,Coordination,FMC,Decreased knowledge of precautions,UE functional use,ADL,Strength,Dexterity,Balance,Vision,Mobility,Sensation Cognitive Skills: Safety Awareness (pt reports long hx of learning difficulties and dyslexia)     Visit Diagnosis: Unsteadiness on feet  Muscle weakness (generalized)  Hemiplegia and hemiparesis following cerebral infarction affecting right dominant side (HCC)  Visuospatial deficit  Other lack of coordination  Other symptoms and signs involving cognitive functions following cerebral infarction  Other abnormalities of gait and mobility    Problem List Patient Active Problem List   Diagnosis Date Noted  . Cerebral thrombosis with cerebral infarction 11/20/2020  . Right sided weakness 11/19/2020  . Hyperkalemia 11/19/2020  . AKI (acute kidney injury) (HCC)   . Dysarthria   . Amnesia 09/08/2020  . 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 Acute cerebrovascular accident (CVA) (HCC) 06/01/2020  . Paronychia of right thumb 05/10/2020  . Mediastinal  adenopathy 01/06/2020  . Secondary hypercoagulable state (HCC) 10/22/2019  . Morbid obesity due to excess calories (HCC) 12/08/2016  . Paroxysmal atrial fibrillation (HCC) 01/23/2013  . Diabetes mellitus (HCC) 01/23/2013  . Hypertension 01/23/2013  . Tobacco user 01/23/2013  . Chronic diastolic heart failure (HCC) 01/21/2013  . 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 Shoulder pain, right 02/15/2011  . Seasonal and perennial allergic rhinitis 02/13/2008  . Dyslipidemia 02/12/2008  . Cocaine abuse (HCC) 02/12/2008  . Obstructive sleep apnea 02/12/2008    Hedy Garro 12/08/2020, 12:50 PM Keene Breath, OTR/L [phone] Phone: [phone] 12:50 PM 12/08/20 [facility] [address] [place], [place], [address] Phone: [phone]   Fax:  [phone]  Name: [name]. MRN: [id number] Date of Birth: Jun 13, 1971

## 2020-12-08 NOTE — Therapy (Signed)
[facility] [address] [place], [name], [address] Phone: [phone]   Fax:  [phone]  Speech Language Pathology Treatment  Patient Details  Name: [name]. MRN: [id number] Date of Birth: [birth year] Referring Provider (SLP): Frann Rider, NP   Encounter Date: 12/08/2020   End of Session - 12/08/20 0844    Visit Number 8    Number of Visits 17    Date for SLP Re-Evaluation 12/31/20    SLP Start Time 0811    SLP Stop Time  0845    SLP Time Calculation (min) 34 min    Activity Tolerance Patient tolerated treatment well           Past Medical History:  Diagnosis Date  . Acute pancreatitis 01/22/2019  . Diabetes mellitus   . GERD (gastroesophageal reflux disease)   . History of alcohol abuse   . History of cocaine use   . History of echocardiogram    a. Echo 4/14: Moderate LVH, vigorous LVEF, EF 65-70%, normal wall motion, grade 2 diastolic dysfunction, mildly dilated aortic root and ascending aorta, ascending aorta 40 mm, aortic root 38 mm, mild LAE  . Hx of cardiovascular stress test    a. GXT 5/14: No ischemic changes  //  b. ETT-Myoview 3/16:  Low risk, no ischemia, EF 58%  . Hyperlipidemia   . Hypertension   . Morbid obesity ([place])   . Paroxysmal atrial fibrillation ([place])    Occurring in 2008, with several recurrence since then (including in the setting of + cocaine on UDS).  . Sleep apnea    uses CPAP  . SVT (supraventricular tachycardia) ([place])    mid to long RP SVT 09/2019    Past Surgical History:  Procedure Laterality Date  . APPENDECTOMY    . ATRIAL FIBRILLATION ABLATION N/A 11/27/2019   Procedure: ATRIAL FIBRILLATION ABLATION;  Surgeon: Thompson Grayer, MD;  Location: [facility];  Service: Cardiovascular;  Laterality: N/A;  . ROTATOR CUFF REPAIR    . SVT ABLATION N/A 11/27/2019   Procedure: SVT ABLATION;  Surgeon: Thompson Grayer, MD;  Location: [facility];   Service: Cardiovascular;  Laterality: N/A;  . TONSILLECTOMY      There were no vitals filed for this visit.   Subjective Assessment - 12/08/20 0849    Subjective Pt arrived 11 minutes late    Currently in Pain? No/denies    Pain Score 0-No pain                 ADULT SLP TREATMENT - 12/08/20 0814      General Information   Behavior/Cognition Cooperative;Alert;Pleasant mood      Treatment Provided   Treatment provided Cognitive-Linquistic      Cognitive-Linquistic Treatment   Treatment focused on Dysarthria    Skilled Treatment Pt arrived 11 minutes late due to transportation. Pt reported increased dysarthria since most recent CVA. Pt stated "it sounds like I have a mouth full of bubble gum." Pt told SLP about recent phone interview that was impacted by dysarthria. SLP provided dysarthria HEP to target imprecise articulation and vocal intensity. Min A required to improve accuracy and carryover of strategies for longer utterances. Of note, pt has not filled Adderall prescription due to finances and feeling of his "heart racing."      Assessment / Recommendations / Plan   Plan Continue with current plan of care      Progression Toward Goals   Progression toward goals  Progressing toward goals            SLP Education - 12/08/20 0843    Education Details dysarthria HEP, writing grocery list in phone    Person(s) Educated Patient    Methods Explanation;Demonstration;Handout    Comprehension Verbalized understanding;Returned demonstration            SLP Short Term Goals - 12/08/20 0844      SLP SHORT TERM GOAL #1   Title pt will tell SLP three ways to compensate for his memory in 3 sessions, with modifeid independence    Baseline 11-05-20, 11-08-20 (calendar), 12-03-20 (phone alarms for meds)    Status Achieved      SLP SHORT TERM GOAL #2   Title pt will demonstrate selective attention to simple-mod complex task for 8 mintues in quiet environment in 2 sessions     Baseline 10-14-20, 11-05-20    Status Partially Met      SLP SHORT TERM GOAL #3   Title pt will demo speech compensations for intelligibility 80% in 10 minutes simple conversation    Time 1    Period Weeks    Status On-going   denies ongoing dysarthria     SLP SHORT TERM GOAL #4   Title pt will perform HEP for dysarthria with occasional min A for exaggeration of speech sounds    Time 1    Period Weeks    Status New            SLP Long Term Goals - 12/08/20 0844      SLP LONG TERM GOAL #1   Title pt will demo simple divided attention skills in functinoal cogntive communcation tasks in 3 sessions    Time 3    Period Weeks   or 17 visits, for all LTGs   Status On-going      SLP LONG TERM GOAL #2   Title pt will report improving swallow skills by telling SLP frequency of cough during liquid consumption is once/month    Time 3    Period Weeks    Status On-going      SLP LONG TERM GOAL #3   Title pt will use memory compensations for appointments, medications, exercise/homework tracking, and/or to-do lists, etc in 5 sessions    Baseline 11-05-20 (meds), 12-03-20    Time 3    Period Weeks    Status On-going            Plan - 12/08/20 0854    Clinical Impression Statement [name] presents today with cont'd mild cognitive communication deficit and new onset worsened dysarthria since most recent CVA. [employer] phone interview impacted by dysarthria. SLP provided dysarthria HEP with emphasis on loud voice and over-articulation. Pt would cont to benefit from skilled ST addressing cognition-communication (and/or pt filling Adderall  prescription), and new onset dysarthria since latest CVA, in order to return to workforce in some capacity and to better communicate with friends and family. Goals may change if pt's attention improves with pt begins taking his prescribed Adderall. Pt may also require objective swallow exam during this therapy course - although it appears as if dysphagia has  resolved spontaneously.    Speech Therapy Frequency 2x / week    Duration 8 weeks   17 sessions   Treatment/Interventions Aspiration precaution training;Pharyngeal strengthening exercises;Diet toleration management by SLP;Trials of upgraded texture/liquids;Cueing hierarchy;Cognitive reorganization;Internal/external aids;Patient/family education;Compensatory strategies;SLP instruction and feedback;Functional tasks    Potential to Achieve Goals Good    Potential Considerations Previous  level of function;Cooperation/participation level    SLP Home Exercise Plan dysarthria HEP    Consulted and Agree with Plan of Care Patient           Patient will benefit from skilled therapeutic intervention in order to improve the following deficits and impairments:   Dysarthria and anarthria  Cognitive communication deficit    Problem List Patient Active Problem List   Diagnosis Date Noted  . Cerebral thrombosis with cerebral infarction 11/20/2020  . Right sided weakness 11/19/2020  . Hyperkalemia 11/19/2020  . AKI (acute kidney injury) ([place])   . Dysarthria   . Amnesia 09/08/2020  . 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 Acute cerebrovascular accident (CVA) ([place]) 06/01/2020  . Paronychia of right thumb 05/10/2020  . Mediastinal adenopathy 01/06/2020  . Secondary hypercoagulable state ([place]) 10/22/2019  . Morbid obesity due to excess calories ([place]) 12/08/2016  . Paroxysmal atrial fibrillation ([place]) 01/23/2013  . Diabetes mellitus ([place]) 01/23/2013  . Hypertension 01/23/2013  . Tobacco user 01/23/2013  . Chronic diastolic heart failure ([place]) 01/21/2013  . 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 Shoulder pain, right 02/15/2011  . Seasonal and perennial allergic rhinitis 02/13/2008  . Dyslipidemia 02/12/2008  . Cocaine abuse ([place]) 02/12/2008  . Obstructive sleep apnea 02/12/2008    Alinda Deem, MA CCC-SLP 12/08/2020, 8:57 AM  [facility] [address] [place], [name], [address] Phone: [phone]   Fax:  [phone]   Name: [name]. MRN: [id number] Date of Birth: 05-Dec-1970

## 2020-12-08 NOTE — Patient Instructions (Signed)
  Coordination Activities  Perform the following activities for 20 minutes 1 times per day with right hand(s).   Rotate ball in fingertips (clockwise and counter-clockwise).  Flip cards 1 at a time as fast as you can.  Deal cards with your thumb (Hold deck in hand and push card off top with thumb).  Pick up coins and place in container or coin bank.  Pick up coins and stack.  Practice writing and/or typing.

## 2020-12-08 NOTE — Patient Instructions (Signed)
  Speech Strategies:   SLOW LOUD OVER-ENNUNCIATE (move your mouth big for each sound) PAUSE (take a deep breath)  PA TA KA  PATA TAKA KAPA PATAKA  BUTTERCUP  CATERPILLAR  BASEBALLL PLAYER  [place]  [place] BUCCANEERS  SLOW AND BIG - EXAGGERATE YOUR MOUTH, MAKE EACH CONSONANT     Speech Exercises  Say 5 times, 2-3 times a day  Call the cat "Buttercup" A calendar of [place], [place] Four floors to cover Yellow oil ointment Fellow lovers of felines Catastrophe in [place] Plump plumbers' plums The church's chimes chimed Telling time 'til eleven Five valve levers Keep the gate closed Go see that guy Fat cows give milk [employer] Gophers Fat frogs flip freely [employer] into bed Get that game to [employer] Thick thistles stick together Cinnamon aluminum linoleum Black bugs blood Lovely lemon linament Red leather, yellow leather  Big grocery buggy    Purple baby carriage [facility] [place] copper coffee pot Ripe purple cabbage Three free throws [employer] tackled  [employer] dipped the dessert  [employer] Buckle that [employer] [place] Shirts shrink, shells shouldn't [place] Take the tackle box File the flash message Give me five flapjacks Fundamental relatives Dye the pets purple Talking [place] time after time Dark chocolate chunks Political landscape of the kingdom Actuary genius We played yo-yos yesterday

## 2020-12-10 ENCOUNTER — Ambulatory Visit: Payer: 59 | Admitting: Physical Therapy

## 2020-12-10 ENCOUNTER — Other Ambulatory Visit: Payer: Self-pay

## 2020-12-10 ENCOUNTER — Ambulatory Visit: Payer: 59

## 2020-12-10 ENCOUNTER — Encounter: Payer: Self-pay | Admitting: Physical Therapy

## 2020-12-10 VITALS — BP 156/100 | HR 75

## 2020-12-10 DIAGNOSIS — M6281 Muscle weakness (generalized): Secondary | ICD-10-CM

## 2020-12-10 DIAGNOSIS — R2681 Unsteadiness on feet: Secondary | ICD-10-CM | POA: Diagnosis not present

## 2020-12-10 DIAGNOSIS — R2689 Other abnormalities of gait and mobility: Secondary | ICD-10-CM

## 2020-12-10 DIAGNOSIS — R471 Dysarthria and anarthria: Secondary | ICD-10-CM

## 2020-12-10 DIAGNOSIS — R41841 Cognitive communication deficit: Secondary | ICD-10-CM

## 2020-12-10 DIAGNOSIS — R1310 Dysphagia, unspecified: Secondary | ICD-10-CM

## 2020-12-10 NOTE — Therapy (Signed)
[name] [address] [place], [name], [address] Phone: [phone]   Fax:  [phone]  Speech Language Pathology Treatment  Patient Details  Name: [name]. MRN: [id number] Date of Birth: 10-Oct-1971 Referring Provider (SLP): Frann Rider, NP   Encounter Date: 12/10/2020   End of Session - 12/10/20 1339    Visit Number 9    Number of Visits 17    Date for SLP Re-Evaluation 12/31/20    SLP Start Time 0803    SLP Stop Time  0845    SLP Time Calculation (min) 42 min    Activity Tolerance Patient tolerated treatment well           Past Medical History:  Diagnosis Date  . Acute pancreatitis 01/22/2019  . Diabetes mellitus   . GERD (gastroesophageal reflux disease)   . History of alcohol abuse   . History of cocaine use   . History of echocardiogram    a. Echo 4/14: Moderate LVH, vigorous LVEF, EF 65-70%, normal wall motion, grade 2 diastolic dysfunction, mildly dilated aortic root and ascending aorta, ascending aorta 40 mm, aortic root 38 mm, mild LAE  . Hx of cardiovascular stress test    a. GXT 5/14: No ischemic changes  //  b. ETT-Myoview 3/16:  Low risk, no ischemia, EF 58%  . Hyperlipidemia   . Hypertension   . Morbid obesity ([place])   . Paroxysmal atrial fibrillation ([place])    Occurring in 2008, with several recurrence since then (including in the setting of + cocaine on UDS).  . Sleep apnea    uses CPAP  . SVT (supraventricular tachycardia) ([place])    mid to long RP SVT 09/2019    Past Surgical History:  Procedure Laterality Date  . APPENDECTOMY    . ATRIAL FIBRILLATION ABLATION N/A 11/27/2019   Procedure: ATRIAL FIBRILLATION ABLATION;  Surgeon: Thompson Grayer, MD;  Location: [facility];  Service: Cardiovascular;  Laterality: N/A;  . ROTATOR CUFF REPAIR    . SVT ABLATION N/A 11/27/2019   Procedure: SVT ABLATION;  Surgeon: Thompson Grayer, MD;  Location: [facility];   Service: Cardiovascular;  Laterality: N/A;  . TONSILLECTOMY      There were no vitals filed for this visit.   Subjective Assessment - 12/10/20 0820    Subjective "My talking's a lot better today and yesterday."                 ADULT SLP TREATMENT - 12/10/20 0825      General Information   Behavior/Cognition Cooperative;Alert;Pleasant mood      Treatment Provided   Treatment provided Cognitive-Linquistic, dysphagia      Cognitive-Linquistic Treatment   Treatment focused on Cognition;Dysarthria;   Skilled Treatment                    Swallowing:  SLP reprinted pt's HEP for articulatory accuracy in the case he feels the need to complete this practice - although pt now agrees speech is at baseline prior to his most recent CVA and no longer sounds like he "has a mouth full of bubble gum" as reported last session. See pt's "s" statement today that he told SLP. Pt told SLP he still believes his attention skills are at baseline (i.e, they are at the same level prior to his initial CVA) - SLP agrees with him. Even though pt exhibited attention deficits prior to his initial CVA SLP provided pt with some  compensations. [name] continues to turn off distractions when he needs to focus on something, he keeps a calendar where he writes appointments as soon as he can after he makes them. Today, pt told SLP the appointments he has for next week. Additionally pt has set alarms for his meds and has not had a miss or skip "since that one time way back."     Today [name] reports that he continues to rarely cough with water, daily. Will request modified (MBSS). SLP explained what MBSS was and the rationale for this. SLP ensured pt was having difficulty with water prior to suggesting referral to NP at neurologist's office. Pt stated difficulty rarely occurs, but happens daily.       Assessment / Recommendations / Plan   Plan Continue with current plan of care;Other (Comment)    request MBSS; possible decr to x1/week if MBSS warrants this     Progression Toward Goals   Progression toward goals Progressing toward goals            SLP Education - 12/10/20 1339    Education Details What MBSS is, rationale for MBSS, pt may need dysphagia tx afterwards due to swallowing muscle weakness    Person(s) Educated Patient    Methods Explanation    Comprehension Verbalized understanding            SLP Short Term Goals - 12/08/20 0844      SLP SHORT TERM GOAL #1   Title pt will tell SLP three ways to compensate for his memory in 3 sessions, with modifeid independence    Baseline 11-05-20, 11-08-20 (calendar), 12-03-20 (phone alarms for meds)    Status Achieved      SLP SHORT TERM GOAL #2   Title pt will demonstrate selective attention to simple-mod complex task for 8 mintues in quiet environment in 2 sessions    Baseline 10-14-20, 11-05-20    Status Partially Met      SLP SHORT TERM GOAL #3   Title pt will demo speech compensations for intelligibility 80% in 10 minutes simple conversation    Time 1    Period Weeks    Status On-going   denies ongoing dysarthria     SLP SHORT TERM GOAL #4   Title pt will perform HEP for dysarthria with occasional min A for exaggeration of speech sounds    Time 1    Period Weeks    Status New            SLP Long Term Goals - 12/10/20 1343      SLP LONG TERM GOAL #1   Title pt will demo simple divided attention skills in functinoal cogntive communcation tasks in 3 sessions    Time 3    Period Weeks   or 17 visits, for all LTGs   Status On-going      SLP LONG TERM GOAL #2   Title pt will report improving swallow skills by telling SLP frequency of cough during liquid consumption is once/month    Time 3    Period Weeks    Status On-going      SLP LONG TERM GOAL #3   Title pt will use memory compensations for appointments, medications, exercise/homework tracking, and/or to-do lists, etc in 5 sessions    Baseline 11-05-20  (meds), 12-03-20, 12-10-20    Time 3    Period Weeks    Status On-going            Plan - 12/10/20  [place] presents today with premorbid attention deficit, and told SLP he thinkgs his dysarthria is (mostly) resolved. Today pt told SLP he has rare coughing with water, daily. SLP is requesting script/referral for modified.f Pt would cont to benefit from skilled ST addressing resolving dysarthria, and possible dysphagia. Pt cont to tell SLP he is waiting to fill Adderall until he has $. Goals may change if pt's attention improves with pt begins taking his prescribed Adderall.    Speech Therapy Frequency 2x / week    Duration 8 weeks   17 sessions   Treatment/Interventions Aspiration precaution training;Pharyngeal strengthening exercises;Diet toleration management by SLP;Trials of upgraded texture/liquids;Cueing hierarchy;Cognitive reorganization;Internal/external aids;Patient/family education;Compensatory strategies;SLP instruction and feedback;Functional tasks    Potential to Achieve Goals Good    Potential Considerations Previous level of function;Cooperation/participation level    SLP Home Exercise Plan dysarthria HEP    Consulted and Agree with Plan of Care Patient           Patient will benefit from skilled therapeutic intervention in order to improve the following deficits and impairments:   Cognitive communication deficit  Dysphagia, unspecified type  Dysarthria and anarthria    Problem List Patient Active Problem List   Diagnosis Date Noted  . Cerebral thrombosis with cerebral infarction 11/20/2020  . Right sided weakness 11/19/2020  . Hyperkalemia 11/19/2020  . AKI (acute kidney injury) ([facility])   . Dysarthria   . Amnesia 09/08/2020  . 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 Acute cerebrovascular accident (CVA) ([place]) 06/01/2020  . Paronychia of right thumb 05/10/2020  . Mediastinal adenopathy 01/06/2020  . Secondary  hypercoagulable state ([facility]) 10/22/2019  . Morbid obesity due to excess calories ([place]) 12/08/2016  . Paroxysmal atrial fibrillation ([place]) 01/23/2013  . Diabetes mellitus ([place]) 01/23/2013  . Hypertension 01/23/2013  . Tobacco user 01/23/2013  . Chronic diastolic heart failure ([place]) 01/21/2013  . 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 Shoulder pain, right 02/15/2011  . Seasonal and perennial allergic rhinitis 02/13/2008  . Dyslipidemia 02/12/2008  . Cocaine abuse ([place]) 02/12/2008  . Obstructive sleep apnea 02/12/2008    [name] ,MS, CCC-SLP  12/10/2020, 1:44 PM  [facility] [address] [place], [name], [address] Phone: [phone]   Fax:  [phone]   Name: Allyn Bertoni. MRN: [id number] Date of Birth: [birth year]

## 2020-12-10 NOTE — Patient Instructions (Signed)
  SLOW AND BIG - EXAGGERATE YOUR MOUTH, MAKE EACH CONSONANT   Speech Exercises  Repeat each one 2 times, 2-3 times a day   Call the cat "Buttercup" A calendar of [place] Four floors to cover Yellow oil ointment Fellow lovers of felines Catastrophe in [place] Plump plumbers' plums The church's chimes chimed Telling time 'til eleven Five valve levers Keep the gate closed Go see that guy Fat cows give milk [employer] Gophers Fat frogs flip freely [employer] into bed Get that game to [employer] Thick thistles stick together Cinnamon aluminum linoleum Black bugs blood Lovely lemon linament Red leather, yellow leather     Big grocery buggy                              Purple baby carriage [facility] Proper copper coffee pot Ripe purple cabbage Three free throws [employer] tackled  [employer] dipped the dessert  [employer] Buckle that [employer] [place] Shirts shrink, shells shouldn't [place] [employer] Take the tackle box File the flash message Give me five flapjacks Fundamental relatives Dye the pets purple Talking [place] time after time Dark chocolate chunks Political landscape of the kingdom Actuary genius We played yo-yos yesterday

## 2020-12-10 NOTE — Therapy (Signed)
[facility] [address] [place], [place], [address] Phone: [phone]   Fax:  [phone]  Physical Therapy Treatment  Patient Details  Name: [name]. MRN: [id number] Date of Birth: Oct 21, 1970 Referring Provider (PT): Miguel Aschoff, MD   Encounter Date: 12/10/2020   PT End of Session - 12/10/20 0852    Visit Number 21    Number of Visits 32   per recert 11/22/2020, to include 2/7 visit   Authorization Type [employer]-60 visit limit PT/OT/speech (each visit of each type counts); as of 11/22/2020, pt has had 11 TOTAL visits in 2022    Authorization - Visit Number 10   PT visits in 2022   Authorization - Number of Visits 20   divided 60/3 disciplines-may need to discuss with other discplines   PT Start Time 0848    PT Stop Time 0930    PT Time Calculation (min) 42 min    Equipment Utilized During Treatment Gait belt    Activity Tolerance Patient tolerated treatment well    Behavior During Therapy WFL for tasks assessed/performed           Past Medical History:  Diagnosis Date  . Acute pancreatitis 01/22/2019  . Diabetes mellitus   . GERD (gastroesophageal reflux disease)   . History of alcohol abuse   . History of cocaine use   . History of echocardiogram    a. Echo 4/14: Moderate LVH, vigorous LVEF, EF 65-70%, normal wall motion, grade 2 diastolic dysfunction, mildly dilated aortic root and ascending aorta, ascending aorta 40 mm, aortic root 38 mm, mild LAE  . Hx of cardiovascular stress test    a. GXT 5/14: No ischemic changes  //  b. ETT-Myoview 3/16:  Low risk, no ischemia, EF 58%  . Hyperlipidemia   . Hypertension   . Morbid obesity ([facility])   . Paroxysmal atrial fibrillation ([facility])    Occurring in 2008, with several recurrence since then (including in the setting of + cocaine on UDS).  . Sleep apnea    uses CPAP  . 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 APPENDECTOMY    . ATRIAL FIBRILLATION ABLATION N/A 11/27/2019   Procedure: ATRIAL FIBRILLATION ABLATION;  Surgeon: [name], MD;  [facility];  Service: Cardiovascular;  Laterality: N/A;  . ROTATOR CUFF REPAIR    . SVT ABLATION N/A 11/27/2019   Procedure: SVT ABLATION;  Surgeon: [name], MD;  Location: [facility];  Service: Cardiovascular;  Laterality: N/A;  . TONSILLECTOMY      Vitals:   12/10/20 0909 12/10/20 0915 12/10/20 0921 12/10/20 0926  BP: (!) 155/102 (!) 147/100 (!) 158/100 (!) 156/100  Pulse: 75 77 75 75     Subjective Assessment - 12/10/20 0849    Subjective Reports being really tired after last session, his right leg was "jumping around while lying in bed". Does report it calmed down pretty quickly. No falls or pain to report today.    Pertinent History Recent hospitalization 2/4-11/21/20 (Dr. Marvel Plan and RN at d/c recommend OP PT)    Patient Stated Goals Pt's goals for therapy are to get back to where I was with strength and coordination.  11/22/20:  To get back where I was before this stroke.    Currently in Pain? No/denies    Pain Score 0-No pain  [facility] Adult PT Treatment/Exercise - 12/10/20 0856      Transfers   Transfers Sit to Stand;Stand to Sit    Sit to Stand 6: Modified independent (Device/Increase time);Without upper extremity assist;From chair/3-in-1;5: Supervision    Stand to Sit 6: Modified independent (Device/Increase time);Without upper extremity assist;To chair/3-in-1;5: Supervision      Ambulation/Gait   Ambulation/Gait Yes    Ambulation/Gait Assistance 5: Supervision    Ambulation/Gait Assistance Details pt arrived with no device. Hoping to get his RW today. Has heard from the company and is paying for it today. Not sure when it will be delivered. use of clinic RW today with cues on posture and walker position needed.    Ambulation Distance (Feet) --    around gym with session.   Assistive device Rolling walker;None    Gait Pattern Step-through pattern;Decreased step length - right;Decreased step length - left;Decreased stance time - right;Decreased weight shift to right;Trendelenburg;Narrow base of support;Decreased dorsiflexion - right    Ambulation Surface Level;Indoor      Self-Care   Self-Care Other Self-Care Comments    Other Self-Care Comments  discussed elevated BP readings today. Pt had not had breakfast, reports he does not eat in the am. Educated on the importance of eating before therapy to have energy and maintain glucose levels. Pt reported having [employer] for dinner last night. Discussed how the sodium in this style of food can cause BP to go up. Pt reported he is working on diet modifications, however "I wanted it just one more time". Discussed the risk on recurrent strokes with elevated BP's. Pt verbalized understanding.      Knee/Hip Exercises: Aerobic   Other Aerobic Scifit level 1.5 with UE/LE's for  minutes with goal 35-45 rpm for activity tolerance and strengthening.                    PT Short Term Goals - 11/22/20 1956      PT SHORT TERM GOAL #1   Title Pt will be independent with revised HEP for improved strength, balance, gait.  TARGET 12/17/2020    Time 4    Period Weeks    Status Revised      PT SHORT TERM GOAL #2   Title Pt will improve TUG score to less than or equal to 18 seconds for decreased fall risk.    Baseline TUG 23.4 sec 11/22/20    Time 4    Period Weeks    Status New      PT SHORT TERM GOAL #3   Title Pt will improve Berg Balance score to at least 19/56 for decreased fall risk.    Baseline 9/56 11/22/20    Time 4    Period Weeks    Status New      PT SHORT TERM GOAL #4   Title Pt will improve 5x sit<>stand to less than or equal to 18 seconds to demonstrate improved functional strength.    Baseline 24.82 11/22/20    Time 4    Status New      PT SHORT TERM GOAL #5   Title Pt will  verbalize understanding of fall prevention in home environment.    Time 4    Period Weeks    Status New             PT Long Term Goals - 11/22/20 1959      PT LONG TERM GOAL #1   Title Pt will be independent with final progression  of HEP for improved gait and balance.  TARGET 01/14/2021    Time 8    Period Weeks    Status Revised      PT LONG TERM GOAL #2   Title Pt will improve TUG score to less than or equal to 13.5 sec for decreased fall risk.    Time 8    Period Weeks    Status New      PT LONG TERM GOAL #3   Title Pt will improve Berg Balance score to at least 35/56 for decreased fall risk.    Time 8    Period Weeks    Status New      PT LONG TERM GOAL #4   Title Pt will improve 5x sit<>stand to less than or equal to 13 sec for improved functional lower extremity strength.    Time 8    Period Weeks    Status New      PT LONG TERM GOAL #5   Title Pt will improve gait velocity to at least 2 ft/sec for improved gait efficiency and decreased fall risk.    Baseline 0.96 ft/sec 11/22/20    Time 8    Period Weeks    Status New      Additional Long Term Goals   Additional Long Term Goals Yes      PT LONG TERM GOAL #6   Title Pt will ambulate at least 1000 ft, indoors and outdoors surfaces, using least restrictive assistive device, mod independently, no LOB, for improved community ambulation.    Time 8    Period Weeks    Status New                 Plan - 12/10/20 [id number]    Clinical Impression Statement Today's skilled session was limited due to elevated BP. BP was 146/98 at start of session. After use of Scifit with limited resistance and low RPM diastolic increased to 102. With seated rest for remainder of session with frequent checks decreased to 100. Pt educated on diet (limiting sodium) to keep BP from elevating. Pt verbalized understanding. Refer to self care section for more details. The pt should benefit from further PT to progress toward unmet LTGs.     Personal Factors and Comorbidities Comorbidity 3+    Comorbidities diabetes, HTN, morbid obesity, history of polysubstance abuse (cocaine and alcohol)    Examination-Activity Limitations Locomotion Level;Transfers;Stairs    Examination-Participation Restrictions Community Activity;Yard Work    Stability/Clinical Decision Making Evolving/Moderate complexity    Rehab Potential Good    PT Frequency 2x / week    PT Duration 8 weeks   including week of recert, 11/22/2020   PT Treatment/Interventions ADLs/Self Care Home Management;Gait training;Stair training;Functional mobility training;Therapeutic activities;Therapeutic exercise;Balance training;Neuromuscular re-education;Patient/family education;DME Instruction    PT Next Visit Plan STGs due. Check on MD order for rolling walker (or was pt able to obtain one on his own?); gait training with RW-curb, ramp, outdoors.  Review HEP from last visit; conitnue to work on RLE Economiststrenghtening and safety.  Please check vitals  (make sure pt has more appts; PT requested 4 additional weeks beyond week of 2/21; we will need to monitor visit number for him, due to VL with insurance, now that he is back to OT and speech). continue with trials of braces for right LE.    PT Home Exercise Plan Access Code: [email or web address]    Consulted and Agree with Plan of Care Patient  Patient will benefit from skilled therapeutic intervention in order to improve the following deficits and impairments:  Abnormal gait,Difficulty walking,Decreased balance,Decreased mobility  Visit Diagnosis: Unsteadiness on feet  Muscle weakness (generalized)  Other abnormalities of gait and mobility     Problem List Patient Active Problem List   Diagnosis Date Noted  . Cerebral thrombosis with cerebral infarction 11/20/2020  . Right sided weakness 11/19/2020  . Hyperkalemia 11/19/2020  . AKI (acute kidney injury) ([facility])   . Dysarthria   . Amnesia 09/08/2020  . Cerebral embolism with  cerebral infarction 06/01/2020  . Acute cerebrovascular accident (CVA) ([facility]) 06/01/2020  . Paronychia of right thumb 05/10/2020  . Mediastinal adenopathy 01/06/2020  . Secondary hypercoagulable state ([facility]) 10/22/2019  . Morbid obesity due to excess calories ([facility]) 12/08/2016  . Paroxysmal atrial fibrillation ([facility]) 01/23/2013  . Diabetes mellitus ([facility]) 01/23/2013  . Hypertension 01/23/2013  . Tobacco user 01/23/2013  . Chronic diastolic heart failure ([facility]) 01/21/2013  . 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 Shoulder pain, right 02/15/2011  . Seasonal and perennial allergic rhinitis 02/13/2008  . Dyslipidemia 02/12/2008  . Cocaine abuse ([facility]) 02/12/2008  . Obstructive sleep apnea 02/12/2008    Sallyanne Kuster, PTA, [facility] [address], [address] [place], [place] [address] [phone] 12/10/20, 4:42 PM   Name: Larenzo [place]. MRN: [id number] Date of Birth: 08-Aug-1971

## 2020-12-13 ENCOUNTER — Encounter: Payer: Self-pay | Admitting: Physical Therapy

## 2020-12-13 ENCOUNTER — Ambulatory Visit: Payer: 59

## 2020-12-13 ENCOUNTER — Ambulatory Visit: Payer: 59 | Admitting: Physical Therapy

## 2020-12-13 ENCOUNTER — Other Ambulatory Visit: Payer: Self-pay

## 2020-12-13 ENCOUNTER — Ambulatory Visit: Payer: 59 | Admitting: Occupational Therapy

## 2020-12-13 VITALS — BP 140/88

## 2020-12-13 DIAGNOSIS — M6281 Muscle weakness (generalized): Secondary | ICD-10-CM

## 2020-12-13 DIAGNOSIS — R278 Other lack of coordination: Secondary | ICD-10-CM

## 2020-12-13 DIAGNOSIS — R1310 Dysphagia, unspecified: Secondary | ICD-10-CM

## 2020-12-13 DIAGNOSIS — R2681 Unsteadiness on feet: Secondary | ICD-10-CM | POA: Diagnosis not present

## 2020-12-13 DIAGNOSIS — R41841 Cognitive communication deficit: Secondary | ICD-10-CM

## 2020-12-13 DIAGNOSIS — R2689 Other abnormalities of gait and mobility: Secondary | ICD-10-CM

## 2020-12-13 DIAGNOSIS — I69351 Hemiplegia and hemiparesis following cerebral infarction affecting right dominant side: Secondary | ICD-10-CM

## 2020-12-13 NOTE — Progress Notes (Signed)
Scheduled appointment with him for recent stroke f/u on 3/1 - this will be discussed further at f/u visit and will likely order as requested

## 2020-12-13 NOTE — Therapy (Signed)
[facility] [address] [place], [name], [address] Phone: [phone]   Fax:  [phone]  Speech Language Pathology Treatment  Patient Details  Name: [name]. MRN: [id number] Date of Birth: May 23, 1971 Referring Provider (SLP): Frann Rider, NP   Encounter Date: 12/13/2020   End of Session - 12/13/20 1544    Visit Number 10    Number of Visits 17    Date for SLP Re-Evaluation 12/31/20    SLP Start Time 0934    SLP Stop Time  7253    SLP Time Calculation (min) 41 min    Activity Tolerance Patient tolerated treatment well           Past Medical History:  Diagnosis Date  . Acute pancreatitis 01/22/2019  . Diabetes mellitus   . GERD (gastroesophageal reflux disease)   . History of alcohol abuse   . History of cocaine use   . History of echocardiogram    a. Echo 4/14: Moderate LVH, vigorous LVEF, EF 65-70%, normal wall motion, grade 2 diastolic dysfunction, mildly dilated aortic root and ascending aorta, ascending aorta 40 mm, aortic root 38 mm, mild LAE  . Hx of cardiovascular stress test    a. GXT 5/14: No ischemic changes  //  b. ETT-Myoview 3/16:  Low risk, no ischemia, EF 58%  . Hyperlipidemia   . Hypertension   . Morbid obesity ([place])   . Paroxysmal atrial fibrillation ([place])    Occurring in 2008, with several recurrence since then (including in the setting of + cocaine on UDS).  . Sleep apnea    uses CPAP  . SVT (supraventricular tachycardia) ([place])    mid to long RP SVT 09/2019    Past Surgical History:  Procedure Laterality Date  . APPENDECTOMY    . ATRIAL FIBRILLATION ABLATION N/A 11/27/2019   Procedure: ATRIAL FIBRILLATION ABLATION;  Surgeon: Thompson Grayer, MD;  Location: [facility];  Service: Cardiovascular;  Laterality: N/A;  . ROTATOR CUFF REPAIR    . SVT ABLATION N/A 11/27/2019   Procedure: SVT ABLATION;  Surgeon: Thompson Grayer, MD;  Location: [facility];   Service: Cardiovascular;  Laterality: N/A;  . TONSILLECTOMY      There were no vitals filed for this visit.   Subjective Assessment - 12/13/20 1543    Subjective "it's good" re: speech    Currently in Pain? No/denies    Pain Score 0-No pain                 ADULT SLP TREATMENT - 12/13/20 0931      General Information   Behavior/Cognition Cooperative;Alert;Pleasant mood      Treatment Provided   Treatment provided Cognitive-Linquistic;Dysphagia      Dysphagia Treatment   Treatment Methods Skilled observation;Compensation strategy training    Other treatment/comments Pt reports some coughing with drinks and difficulty with food getting "stuck," which occurs if pt is "distracted" or eating too quickly. SLP reviewed swallow strategies to trial, including reducing distractions, take smaller sips/bites, and chewing food well. MBSS to be scheduled.      Cognitive-Linquistic Treatment   Treatment focused on Cognition    Skilled Treatment Today, [name] reports speech was returned to prior baseline, unless "if I talk too long, it feels like bubblegum." SLP reviewed external memory aids to compensate for memory (appointments in calendar, alarm for meds), which have been succesful. Pt reports his sister cued him to take a picture of grocery list, appointments, and  medications. Pt unable to provide example of strategy as not yet completed. Pt uses air fryer and microwave with success. Some difficulty exhibited with topic maintenance related to targeted questions.      Assessment / Recommendations / Plan   Plan Continue with current plan of care;Other (Comment)      Progression Toward Goals   Progression toward goals Progressing toward goals            SLP Education - 12/13/20 1005    Education Details swallow strateiges to trial, reducing environmental modifications    Person(s) Educated Patient    Methods Explanation;Demonstration    Comprehension Verbalized  understanding;Returned demonstration            SLP Short Term Goals - 12/13/20 0925      SLP SHORT TERM GOAL #1   Title pt will tell SLP three ways to compensate for his memory in 3 sessions, with modifeid independence    Baseline 11-05-20, 11-08-20 (calendar), 12-03-20 (phone alarms for meds)    Status Achieved      SLP SHORT TERM GOAL #2   Title pt will demonstrate selective attention to simple-mod complex task for 8 mintues in quiet environment in 2 sessions    Baseline 10-14-20, 11-05-20    Status Partially Met      SLP SHORT TERM GOAL #3   Title pt will demo speech compensations for intelligibility 80% in 10 minutes simple conversation    Baseline 12-13-20    Time --    Period Weeks    Status On-going   denies ongoing dysarthria     SLP SHORT TERM GOAL #4   Title pt will perform HEP for dysarthria with occasional min A for exaggeration of speech sounds    Time --    Period Weeks    Status On-going            SLP Long Term Goals - 12/13/20 0925      SLP LONG TERM GOAL #1   Title pt will demo simple divided attention skills in functinoal cogntive communcation tasks in 3 sessions    Time 2    Period Weeks   or 17 visits, for all LTGs   Status On-going      SLP LONG TERM GOAL #2   Title pt will report improving swallow skills by telling SLP frequency of cough during liquid consumption is once/month    Time 2    Period Weeks    Status On-going      SLP LONG TERM GOAL #3   Title pt will use memory compensations for appointments, medications, exercise/homework tracking, and/or to-do lists, etc in 5 sessions    Baseline 11-05-20 (meds), 12-03-20, 12-10-20, 12-13-20    Time 2    Period Weeks    Status On-going            Plan - 12/13/20 1248    Clinical Impression Statement [name] presents today with premorbid attention deficit, and told SLP he thinks his dysarthria is (mostly) resolved. Pt has told SLP he has some occasional coughing with water and food getting  "stuck." Previous SLP requested script/referral for MBSS. SLP introduced swallow strategies to trial at home. Pt would cont to benefit from skilled ST addressing resolving dysarthria, and possible dysphagia. Pt cont to tell SLP he is waiting to fill Adderall until he has $. Goals may change if pt's attention improves with pt begins taking his prescribed Adderall.    Speech Therapy Frequency 2x /  week    Duration 8 weeks    Treatment/Interventions Aspiration precaution training;Pharyngeal strengthening exercises;Diet toleration management by SLP;Trials of upgraded texture/liquids;Cueing hierarchy;Cognitive reorganization;Internal/external aids;Patient/family education;Compensatory strategies;SLP instruction and feedback;Functional tasks    Potential Considerations Previous level of function    SLP Home Exercise Plan dysarthria HEP    Consulted and Agree with Plan of Care Patient           Patient will benefit from skilled therapeutic intervention in order to improve the following deficits and impairments:   Cognitive communication deficit  Dysphagia, unspecified type    Problem List Patient Active Problem List   Diagnosis Date Noted  . Cerebral thrombosis with cerebral infarction 11/20/2020  . Right sided weakness 11/19/2020  . Hyperkalemia 11/19/2020  . AKI (acute kidney injury) ([place])   . Dysarthria   . Amnesia 09/08/2020  . 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 Acute cerebrovascular accident (CVA) ([place]) 06/01/2020  . Paronychia of right thumb 05/10/2020  . Mediastinal adenopathy 01/06/2020  . Secondary hypercoagulable state ([place]) 10/22/2019  . Morbid obesity due to excess calories ([place]) 12/08/2016  . Paroxysmal atrial fibrillation ([place]) 01/23/2013  . Diabetes mellitus ([place]) 01/23/2013  . Hypertension 01/23/2013  . Tobacco user 01/23/2013  . Chronic diastolic heart failure ([place]) 01/21/2013  . 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 Shoulder pain, right  02/15/2011  . Seasonal and perennial allergic rhinitis 02/13/2008  . Dyslipidemia 02/12/2008  . Cocaine abuse ([place]) 02/12/2008  . Obstructive sleep apnea 02/12/2008    Alinda Deem, MA CCC-SLP 12/13/2020, 3:44 PM  [facility] [address] [place], [name], [address] Phone: [phone]   Fax:  [phone]   Name: [name]. MRN: [id number] Date of Birth: 1971/03/30

## 2020-12-13 NOTE — Therapy (Signed)
[facility] [address] [place], [name], [address] Phone: [phone]   Fax:  [phone]  Physical Therapy Treatment  Patient Details  Name: [name]. MRN: [id number] Date of Birth: Feb 19, 1971 Referring Provider (PT): Angelica Pou, MD   Encounter Date: 12/13/2020   PT End of Session - 12/13/20 1253    Visit Number 22    Number of Visits 32    Authorization Type [employer]-60 visit limit PT/OT/speech (each visit of each type counts); as of 11/22/2020, pt has had 11 TOTAL visits in 2022    Authorization - Visit Number 10    Authorization - Number of Visits 20    PT Start Time 1017    PT Stop Time 1100    PT Time Calculation (min) 43 min    Activity Tolerance Patient tolerated treatment well           Past Medical History:  Diagnosis Date  . Acute pancreatitis 01/22/2019  . Diabetes mellitus   . GERD (gastroesophageal reflux disease)   . History of alcohol abuse   . History of cocaine use   . History of echocardiogram    a. Echo 4/14: Moderate LVH, vigorous LVEF, EF 65-70%, normal wall motion, grade 2 diastolic dysfunction, mildly dilated aortic root and ascending aorta, ascending aorta 40 mm, aortic root 38 mm, mild LAE  . Hx of cardiovascular stress test    a. GXT 5/14: No ischemic changes  //  b. ETT-Myoview 3/16:  Low risk, no ischemia, EF 58%  . Hyperlipidemia   . Hypertension   . Morbid obesity ([place])   . Paroxysmal atrial fibrillation ([place])    Occurring in 2008, with several recurrence since then (including in the setting of + cocaine on UDS).  . Sleep apnea    uses CPAP  . SVT (supraventricular tachycardia) ([place])    mid to long RP SVT 09/2019    Past Surgical History:  Procedure Laterality Date  . APPENDECTOMY    . ATRIAL FIBRILLATION ABLATION N/A 11/27/2019   Procedure: ATRIAL FIBRILLATION ABLATION;  Surgeon: Thompson Grayer, MD;  Location: [facility];  Service: Cardiovascular;   Laterality: N/A;  . ROTATOR CUFF REPAIR    . SVT ABLATION N/A 11/27/2019   Procedure: SVT ABLATION;  Surgeon: Thompson Grayer, MD;  Location: [facility];  Service: Cardiovascular;  Laterality: N/A;  . TONSILLECTOMY      Vitals:   12/13/20 1025  BP: 140/88     Subjective Assessment - 12/13/20 1025    Subjective Reports R leg is not doing that "jumping thing". Pt reports feeling off  balance when stepping on/off curb. Difficulty getting in/out of a high truck. Pt has not gotten walker maybe this Friday.              [facility] PT Assessment - 12/13/20 0001      Transfers   Transfers Sit to Stand;Stand to Sit    Sit to Stand 7: Independent    Five time sit to stand comments  14.25      Berg Balance Test   Sit to Stand Able to stand without using hands and stabilize independently    Standing Unsupported Able to stand 2 minutes with supervision    Sitting with Back Unsupported but Feet Supported on Floor or Stool Able to sit safely and securely 2 minutes    Stand to Sit Sits safely with minimal use of hands    Transfers Able  to transfer safely, minor use of hands    Standing Unsupported with Eyes Closed Able to stand 10 seconds with supervision    Standing Unsupported with Feet Together Able to place feet together independently but unable to hold for 30 seconds    From Standing, Reach Forward with Outstretched Arm Can reach confidently >25 cm (10")    From Standing Position, Pick up Object from [place] to pick up shoe safely and easily    From Standing Position, Turn to Look Behind Over each Shoulder Looks behind from both sides and weight shifts well    Turn 360 Degrees Able to turn 360 degrees safely but slowly    Standing Unsupported, Alternately Place Feet on Step/Stool Able to stand independently and complete 8 steps >20 seconds    Standing Unsupported, One Foot in Front Able to plae foot ahead of the other independently and hold 30 seconds    Standing on One Leg Able to  lift leg independently and hold equal to or more than 3 seconds    Total Score 46      Timed Up and Go Test   TUG Normal TUG    Normal TUG (seconds) 10.39                                 PT Education - 12/13/20 1254    Education Details Reviewed STGs checked.    Person(s) Educated Patient    Methods Explanation    Comprehension Verbalized understanding            PT Short Term Goals - 12/13/20 1248      PT SHORT TERM GOAL #1   Title Pt will be independent with revised HEP for improved strength, balance, gait.  TARGET 12/17/2020    Baseline 12/13/20 Pt reports doing current HEP and walking daily.    Time 4    Period Weeks    Status Achieved      PT SHORT TERM GOAL #2   Title Pt will improve TUG score to less than or equal to 18 seconds for decreased fall risk.    Baseline TUG 10.39 sec 12/13/20    Time 4    Period Weeks    Status Achieved      PT SHORT TERM GOAL #3   Title Pt will improve Berg Balance score to at least 19/56 for decreased fall risk.    Baseline 46/56 12/13/20    Time 4    Period Weeks    Status Achieved      PT SHORT TERM GOAL #4   Title Pt will improve 5x sit<>stand to less than or equal to 18 seconds to demonstrate improved functional strength.    Baseline 14.45  12/13/20    Time 4    Status Achieved      PT SHORT TERM GOAL #5   Title Pt will verbalize understanding of fall prevention in home environment.    Time 4    Period Weeks    Status Unable to assess   unable due to time constraints            PT Long Term Goals - 11/22/20 1959      PT LONG TERM GOAL #1   Title Pt will be independent with final progression of HEP for improved gait and balance.  TARGET 01/14/2021    Time 8    Period Weeks    Status  Revised      PT LONG TERM GOAL #2   Title Pt will improve TUG score to less than or equal to 13.5 sec for decreased fall risk.    Time 8    Period Weeks    Status New      PT LONG TERM GOAL #3   Title Pt will  improve Berg Balance score to at least 35/56 for decreased fall risk.    Time 8    Period Weeks    Status New      PT LONG TERM GOAL #4   Title Pt will improve 5x sit<>stand to less than or equal to 13 sec for improved functional lower extremity strength.    Time 8    Period Weeks    Status New      PT LONG TERM GOAL #5   Title Pt will improve gait velocity to at least 2 ft/sec for improved gait efficiency and decreased fall risk.    Baseline 0.96 ft/sec 11/22/20    Time 8    Period Weeks    Status New      Additional Long Term Goals   Additional Long Term Goals Yes      PT LONG TERM GOAL #6   Title Pt will ambulate at least 1000 ft, indoors and outdoors surfaces, using least restrictive assistive device, mod independently, no LOB, for improved community ambulation.    Time 8    Period Weeks    Status New                 Plan - 12/13/20 1242    Clinical Impression Statement Skilled session focused on checking pt's STGs, discussing results of test, POC, and checking BP.  Pt's BP was lower today 140/88. Pt is progressing well and met all STGs (except #5 was not checked due to time contraints). Pt reports that he continues to feel off balance with curb negotiation and getting in/out of tall trucks.    Personal Factors and Comorbidities Comorbidity 3+    Comorbidities diabetes, HTN, morbid obesity, history of polysubstance abuse (cocaine and alcohol)    Examination-Activity Limitations Locomotion Level;Transfers;Stairs    Examination-Participation Restrictions Community Activity;Yard Work    Stability/Clinical Decision Making Evolving/Moderate complexity    Rehab Potential Good    PT Frequency 2x / week    PT Duration 8 weeks   including week of recert, 04/20/2830   PT Treatment/Interventions ADLs/Self Care Home Management;Gait training;Stair training;Functional mobility training;Therapeutic activities;Therapeutic exercise;Balance training;Neuromuscular  re-education;Patient/family education;DME Instruction    PT Next Visit Plan PLEASE check STG # 5. Curb negotiation and truck transfers with high steps. Update HEP and walking program .      Per STG test results discuss AD recommendation.    Check on MD order for rolling walker (or was pt able to obtain one on his own?); gait training with RW-curb, ramp, outdoors.  Review HEP from last visit; conitnue to work on [employer].  Please check vitals  (make sure pt has more appts; PT requested 4 additional weeks beyond week of 2/21; we will need to monitor visit number for him, due to VL with insurance, now that he is back to OT and speech). continue with trials of braces for right LE.    PT Home Exercise Plan Access Code: [email or web address]    Consulted and Agree with Plan of Care Patient           Patient will benefit from skilled therapeutic  intervention in order to improve the following deficits and impairments:  Abnormal gait,Difficulty walking,Decreased balance,Decreased mobility  Visit Diagnosis: Muscle weakness (generalized)  Hemiplegia and hemiparesis following cerebral infarction affecting right dominant side (HCC)  Unsteadiness on feet  Other abnormalities of gait and mobility     Problem List Patient Active Problem List   Diagnosis Date Noted  . Cerebral thrombosis with cerebral infarction 11/20/2020  . Right sided weakness 11/19/2020  . Hyperkalemia 11/19/2020  . AKI (acute kidney injury) ([place])   . Dysarthria   . Amnesia 09/08/2020  . 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 Acute cerebrovascular accident (CVA) ([place]) 06/01/2020  . Paronychia of right thumb 05/10/2020  . Mediastinal adenopathy 01/06/2020  . Secondary hypercoagulable state ([place]) 10/22/2019  . Morbid obesity due to excess calories ([place]) 12/08/2016  . Paroxysmal atrial fibrillation ([place]) 01/23/2013  . Diabetes mellitus ([place]) 01/23/2013  . Hypertension 01/23/2013  . Tobacco user  01/23/2013  . Chronic diastolic heart failure ([place]) 01/21/2013  . 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 Shoulder pain, right 02/15/2011  . Seasonal and perennial allergic rhinitis 02/13/2008  . Dyslipidemia 02/12/2008  . Cocaine abuse ([place]) 02/12/2008  . Obstructive sleep apnea 02/12/2008    Bjorn Loser, PTA  12/13/20, 1:00 PM [facility] [address] [place], [name], [address] Phone: [phone]   Fax:  [phone]  Name: [name]. MRN: [id number] Date of Birth: 1971/08/30

## 2020-12-13 NOTE — Patient Instructions (Signed)
     Cane Overhead - Standing   SIT, With arms straight, hold cane forward at waist. Raise cane OVERHEAD Hold 3 seconds. Repeat 10 times. Do 1-2 times per day.     ROM: Abduction - Wand   Holding wand with RIGHT hand palm up, push wand directly out to side, leading with other hand palm down, until stretch is felt. Hold 3 seconds.  Don't raise shoulder.  Repeat 10 times per set. Do 1-2 sessions per day.       Reverse Fly / Shoulder Retraction    Extend both arms in front of body at BELLY BUTTON HEIGHT, palms down, holding band. Move arms out to sides, squeeze shoulder blades together. Repeat 10 times. Do 1-2 sessions per day.  Scapular Retraction: Bilateral    Facing anchor, pull arms back, bringing shoulder blades together. Repeat 10 times per set. Do 1-2 sessions per day.

## 2020-12-13 NOTE — Progress Notes (Signed)
[facility] [address] [place]. [place] [address] [phone]       STROKE FOLLOW UP NOTE  [name] Date of Birth:  [birth year] Medical Record Number:  [id number]   Reason for Referral: stroke follow up    SUBJECTIVE:   CHIEF COMPLAINT:  Chief Complaint  Patient presents with  . Follow-up    Rm 14 alone Pt has had 3 small stokes in February    HPI:   Today, 12/13/2020, [name] returns for stroke follow-up unaccompanied.  He was initially scheduled 11/18/2020 for routine follow-up but left prior to being seen as he was late for appointment.  He was seen by his PCP that day with complaints of 2-day onset of worsening right-sided weakness and dysarthria.  Initially planned on doing further work-up outpatient as symptoms present >48 hours but lab work showed hyperkalemia therefore PCP advised pt to proceed to ED for further management on 11/19/2020.  MRI showed acute infarction in the left pons, right lateral splenium of the corpus callosum and medial left temporal lobe/hippocampus felt likely microembolic with history of A. fib despite compliance on Xarelto.  MRA head/neck unremarkable.  Due to concern of Xarelto failure, switched to Eliquis 5 mg twice daily.  Aspirin 81 mg daily also initiated.  Potassium levels stabilized with medication adjustments and interventions and advised outpatient follow-up with PCP.  Since discharge, reports continued right sided weakness, dysarthria, gait impairment with imbalance, cognitive impairment, and possible dysphagia.  SLP reached out prior to todays visit with pt reporting concern of occasional coughing with water - no difficulty with food but typically difficulty with water - SLP Baldo Ash requested MBS for further evaluation Currently working with neuro rehab PT/OT/SLP He is currently awaiting for rolling walker to help him ambulate longer distances -currently ambulating without assistive device but denies any  recent falls Denies new or worsening stroke/TIA symptoms  Reports compliance on Eliquis 5 mg twice daily and aspirin 81 mg daily-denies side effects Reports compliance on atorvastatin 80 mg daily -denies side effects Blood pressure today 160/96 - does not routinely monitor at home but has been elevated during therapy visits Glucose levels not currently monitored at home as he recently ran out of lancets and plans on contacting PCP for refill  He continues to follow with pulmonology Dr. Maple Hudson for history of narcolepsy with cataplexy and sleep apnea but plans on pursuing CPAP/BiPAP titration due to difficulty tolerating CPAP.  Titration study scheduled 01/16/2021.   Lab work 11/19/2020 A1c 7.7 (down from 9.9) LDL 102 -  Increased atorvastatin from 40 mg to 80 mg daily  Continued tobacco use approx 1 pack/week with eventual goal of complete cessation  No further concerns at this time     History provided for reference purposes only Initial visit 08/09/2020 JM: [name] is being seen for hospital follow-up unaccompanied.  Reports residual mild to moderate speech difficulty, short-term memory impairment, imbalance and visual impairment. Denies residual right sided weakness.  He has not worked with any therapy but PCP placed referral to initiate physical and speech therapy but he has not scheduled visits at this time.  Reports worsening vision post stroke with underlying history of diabetic retinopathy.  Denies new or worsening stroke/TIA symptoms.  He reports ongoing compliance with Xarelto and denies bleeding or bruising.  Reports compliance with atorvastatin and denies myalgias.  Blood pressure today satisfactory at 127/77.  He does not routinely monitor at home.  Glucose level stable per patient.  He  does report excessive daytime fatigue but has been noncompliant with CPAP over the past couple months due to increased anxiety and "fear of choking".  He has not further discuss this with his  pulmonologist.  Prescribed Adderall for history of narcolepsy but he has not taken since July as he has not been working although per epic reviewed, pulmonologist recently refilled Adderall prescription.  No further concerns at this time.   Stroke admission 06/01/2020 [name] is a 50 y.o. male with history of atrial fibrillation, OSA on CPAP, narcolepsy, diabetes, hypertension, morbid obesity, GERD, history of polysubstance abuse including cocaine and alcohol  who presented on 06/01/2020 with persistentdysarthric speech.  Stroke work-up revealed small L caudate body infarct embolic secondary to known AF source not consistently taking Xarelto.  Recommended continuation of Xarelto consistently for secondary stroke prevention.  HTN stable.  LDL 103 and increase atorvastatin to 40 mg daily.  Uncontrolled DM with A1c 9.9.  Other stroke risk factors include tobacco use, hx ETOH and substance abuse, obesity, family history of stroke and OSA on CPAP.  No prior personal history of stroke.  Evaluated by therapy without PT/OT needs but recommended outpatient SLP for residual cognitive impairment and mild to moderate dysarthria  Stroke:   Small L caudate body infarct embolic secondary to known AF source not consistently taking Xarelto  CT head focal hyperdensities B corona radiata ? Acute infarct, prominent Small vessel disease and Atrophy.   MRI  Small L caudate body infarct. Small vessel disease. Atrophy.   MR CS Unremarkable   MRA head Unremarkable   MRA neck Unremarkable   2D Echo EF 60-65%. No source of embolus. LA mildly dilated  LDL 103  HgbA1c 9.9  VTE prophylaxis - SCDs    Xarelto (rivaroxaban) daily prior to admission, now on aspirin 325 mg daily. Not taking xarelto as prescribed - resume xarelto and stop aspirin   Therapy recommendations:  HH SLP  Disposition:  home      ROS:   14 system review of systems performed and negative with exception of those listed in  HPI  PMH:  Past Medical History:  Diagnosis Date  . Acute pancreatitis 01/22/2019  . Diabetes mellitus   . GERD (gastroesophageal reflux disease)   . History of alcohol abuse   . History of cocaine use   . History of echocardiogram    a. Echo 4/14: Moderate LVH, vigorous LVEF, EF 65-70%, normal wall motion, grade 2 diastolic dysfunction, mildly dilated aortic root and ascending aorta, ascending aorta 40 mm, aortic root 38 mm, mild LAE  . Hx of cardiovascular stress test    a. GXT 5/14: No ischemic changes  //  b. ETT-Myoview 3/16:  Low risk, no ischemia, EF 58%  . Hyperlipidemia   . Hypertension   . Morbid obesity (HCC)   . Paroxysmal atrial fibrillation (HCC)    Occurring in 2008, with several recurrence since then (including in the setting of + cocaine on UDS).  . Sleep apnea    uses CPAP  . SVT (supraventricular tachycardia) (HCC)    mid to long RP SVT 09/2019    PSH:  Past Surgical History:  Procedure Laterality Date  . APPENDECTOMY    . ATRIAL FIBRILLATION ABLATION N/A 11/27/2019   Procedure: ATRIAL FIBRILLATION ABLATION;  Surgeon: [name], MD;  Location: [facility] INVASIVE CV LAB;  Service: Cardiovascular;  Laterality: N/A;  . ROTATOR CUFF REPAIR    . SVT ABLATION N/A 11/27/2019   Procedure: SVT ABLATION;  Surgeon: Hillis RangeAllred, James, MD;  [facility];  Service: Cardiovascular;  Laterality: N/A;  . TONSILLECTOMY      Social History:  Social History   Socioeconomic History  . Marital status: Single    Spouse name: Not on file  . Number of children: 0  . Years of education: 2412  . Highest education level: Not on file  Occupational History  . Occupation: Designer, industrial/productUnemployed    Employer: UNEMPLOYED  Tobacco Use  . Smoking status: Current Every Day Smoker    Packs/day: 0.50    Years: 28.00    Pack years: 14.00    Types: Cigarettes  . Smokeless tobacco: Former NeurosurgeonUser    Types: Snuff  Vaping Use  . Vaping Use: Never used  Substance and Sexual Activity  . Alcohol  use: Not Currently    Alcohol/week: 6.0 standard drinks    Types: 6 Standard drinks or equivalent per week    Comment: monthly  . Drug use: Not Currently    Comment: cocaine in the past, none currently  . Sexual activity: Not on file  Other Topics Concern  . Not on file  Social History Narrative   Fun: PhotographerDrag racing       [place]      Unemployed, was working a Office managersecurity job   Social Determinants of Community education officerHealth   Financial Resource Strain: Not on file  Food Insecurity: Not on file  Transportation Needs: Not on file  Physical Activity: Not on file  Stress: Not on file  Social Connections: Not on file  Intimate Partner Violence: Not on file    Family History:  Family History  Problem Relation Age of Onset  . Diabetes Mother   . Heart attack Father [id number]  . Stroke Maternal Grandmother   . Stroke Paternal Grandmother   . Diabetes Paternal Grandfather     Medications:   Current Outpatient Medications on File Prior to Visit  Medication Sig Dispense Refill  . amLODipine (NORVASC) 5 MG tablet Take 2 tablets (10 mg total) by mouth daily. 30 tablet 11  . amphetamine-dextroamphetamine (ADDERALL XR) 30 MG 24 hr capsule Take 1 capsule (30 mg total) by mouth daily as needed (focus). 1 daily as needed 30 capsule 0  . apixaban (ELIQUIS) 5 MG TABS tablet Take 1 tablet (5 mg total) by mouth 2 (two) times daily. 60 tablet 0  . aspirin EC 81 MG EC tablet Take 1 tablet (81 mg total) by mouth daily. Swallow whole. 30 tablet 11  . atorvastatin (LIPITOR) 80 MG tablet Take 1 tablet (80 mg total) by mouth daily. 30 tablet 0  . Blood Pressure Monitoring (BLOOD PRESSURE MONITOR/L CUFF) MISC 1 Units by Does not apply route daily. 1 each 0  . Dulaglutide (TRULICITY) 0.75 MG/0.5ML SOPN Inject 0.75 mg into the skin once a week. (Patient taking differently: Inject 0.75 mg into the skin every Monday.) 2 mL 5  . flecainide (TAMBOCOR) 100 MG tablet Take 1 tablet (100 mg total) by mouth 2  (two) times daily. 180 tablet 3  . glipiZIDE (GLUCOTROL) 10 MG tablet Take 10 mg by mouth 2 (two) times daily before a meal.    . hydrochlorothiazide (MICROZIDE) 12.5 MG capsule Take 2 capsules (25 mg total) by mouth daily. 30 capsule 0  . lactose free nutrition (BOOST) LIQD Take 237 mLs by mouth daily.    . metFORMIN (GLUCOPHAGE) 500 MG tablet Take 1,000 mg by mouth 2 (two) times daily with a meal.     .  metoprolol succinate (TOPROL XL) 100 MG 24 hr tablet Take 1 tablet (100 mg total) by mouth daily. Take with or immediately following a meal. 30 tablet 11  . nicotine (NICODERM CQ - DOSED IN MG/24 HOURS) 21 mg/24hr patch Place 21 mg onto the skin daily as needed (smoking cessation).    . pantoprazole (PROTONIX) 40 MG tablet Take 1 tablet (40 mg total) by mouth daily. (Patient taking differently: Take 40 mg by mouth daily with supper.) 30 tablet 5  . sodium polystyrene (KAYEXALATE) powder Take by mouth in the morning and at bedtime. 450 each 0   No current facility-administered medications on file prior to visit.    Allergies:   Allergies  Allergen Reactions  . Shrimp [Shellfish Allergy] Anaphylaxis  . Banana Other (See Comments)    Pt gags  . Watermelon Flavor Nausea And Vomiting  . Other Itching    Grapes cause itching  . Almond Oil Itching    Roof of mouth itches  . Peanut-Containing Drug Products Itching and Cough  . Sulfa Antibiotics Itching      OBJECTIVE:  Physical Exam  Vitals:   12/14/20 0829  BP: (!) 160/96  Pulse: 75  Weight: (!) 315 lb (142.9 kg)  Height: 5\' 9"  (1.753 m)   Body mass index is 46.52 kg/m. No exam data present  General: Morbidly obese pleasant middle-aged African-American male, seated, in no evident distress Head: head normocephalic and atraumatic.   Neck: supple with no carotid or supraclavicular bruits Cardiovascular: regular rate and rhythm, no murmurs Musculoskeletal: no deformity Skin:  no rash/petichiae Vascular:  Normal pulses all  extremities   Neurologic Exam Mental Status: Awake and fully alert.  Mild to moderate dysarthria with mild baseline stuttering.  Worsening dysarthria with prolonged conversation or when attempting to speak too quickly.  Oriented to place and time. Recent memory impaired and remote memory intact. Attention span, concentration and fund of knowledge appropriate during visit. Mood and affect flat.  Cranial Nerves: Pupils equal, briskly reactive to light. Extraocular movements full without nystagmus. Visual fields full to confrontation. Hearing intact. Facial sensation intact. R lower facial weakness. Tongue and palate moves normally and symmetrically.  Motor: Normal bulk and tone. Normal strength in all tested extremity muscles except slight weakness right grip strength. Sensory.: intact to touch , pinprick , position and vibratory sensation.  Coordination: Rapid alternating movements normal in all extremities except decreased right hand dexterity. Finger-to-nose and heel-to-shin performed accurately bilaterally. Gait and Station: Arises from chair without difficulty. Stance is normal. Gait demonstrates  shortened stride length and step height with mild unsteadiness and imbalance without use of assistive device.  Tandem walk and heel toe not attempted. Reflexes: 1+ and symmetric. Toes downgoing.         ASSESSMENT: Karion Cudd. is a 50 y.o. year old male with history of left caudate head stroke likely in setting of known AF with Xarelto noncompliance 06/01/2020 and recent multiple embolic infarcts within L pons, left temporal lobe and left lateral splenium of corpus callosum 11/19/2020 likely in setting of known AF despite Xarelto compliance therefore switched to Eliquis. Vascular risk factors include A. Fib, HTN, HLD, DM, OSA with CPAP intolerance, morbid obesity, current tobacco use and history of polysubstance and EtOH use.      PLAN:  1. Multiple embolic strokes:  a. Residual  deficit: Right hand weakness, gait impairment with imbalance, dysarthria,cognitive impairment and possible dysphagia i. Order placed for MBS for further evaluation of possible dysphagia ii.  Continue working with neuro rehab PT/OT/SLP b. Continue aspirin 81 mg daily and Eliquis (apixaban) daily  and atorvastatin 80 mg daily for secondary stroke prevention c. Discussed secondary stroke prevention measures and importance of close PCP follow up for aggressive stroke risk factor management  2. Atrial fibrillation:  a. CHA2DS2-VASc score 4 indicating use of AC for stroke prevention.   b. In setting of recent stroke, likely Xarelto failure therefore switched to Eliquis 5 mg twice daily c. Has not had recent follow-up with cardiology -advised patient to contact office to schedule follow-up visit 3. HTN: BP goal <130/90.  Uncontrolled on hydrochlorothiazide, Tambocor and metoprolol per PCP -recent adjustments to BP regimen in setting of hyperkalemia -encouraged monitoring at home and f/u with PCP next week for further discussion 4. HLD: LDL goal <70. Recent LDL 102 on atorvastatin 40 mg daily therefore increased to 80 mg daily during recent stroke admission.  Encourage continued increased dosage and follow-up with PCP in the next 1 to 2 months for repeat lipid panel as well as ongoing prescribing of statin 5. DMII: A1c goal<7.0. Recent A1c 7.7 down from 9.9.  Monitored/managed by PCP 6. OSA, narcolepsy: Followed by Dr. Maple Hudson.  Plans on titration study 4/3 due to difficulty tolerating CPAP.  Discussed importance of sleep apnea treatment for secondary stroke prevention and cardiovascular disease 7. Tobacco abuse: Discussed importance of complete tobacco cessation    Follow up in 4 months or call earlier if needed  CC:  [facility] provider: Dr. Rogers Blocker, MD     I spent 40 minutes of face-to-face and non-face-to-face time with patient.  This included previsit chart review including recent  hospitalization pertinent progress notes, lab work and imaging, lab review, study review, order entry, electronic health record documentation, patient education regarding recent stroke and etiology as well as prior strokes, residual deficits, importance of managing stroke risk factors including management of sleep apnea and answered all other questions to patients satisfaction   Ihor Austin, AGNP-BC  [facility] [address] [place], [place] [address]  Phone [phone] Fax [phone] Note: This document was prepared with digital dictation and possible smart phrase technology. Any transcriptional errors that result from this process are unintentional.

## 2020-12-13 NOTE — Therapy (Signed)
[facility] [address] [place], [place], [address] Phone: [phone]   Fax:  [phone]  Occupational Therapy Treatment  Patient Details  Name: [name]. MRN: [id number] Date of Birth: September 25, 1971 Referring Provider (OT): Dr. Shanon Brow   Encounter Date: 12/13/2020   OT End of Session - 12/13/20 0853    Visit Number 3    Number of Visits 13    Date for OT Re-Evaluation 01/07/21    Authorization Type UHC--copay per day, VL:60 each PT/OT/ST.   (3 previous OT visits used this year)    Authorization - Visit Number 15    Authorization - Number of Visits 60    OT Start Time [phone]    OT Stop Time 0930    OT Time Calculation (min) 40 min    Activity Tolerance Patient tolerated treatment well    Behavior During Therapy WFL for tasks assessed/performed           Past Medical History:  Diagnosis Date  . Acute pancreatitis 01/22/2019  . Diabetes mellitus   . GERD (gastroesophageal reflux disease)   . History of alcohol abuse   . History of cocaine use   . History of echocardiogram    a. Echo 4/14: Moderate LVH, vigorous LVEF, EF 65-70%, normal wall motion, grade 2 diastolic dysfunction, mildly dilated aortic root and ascending aorta, ascending aorta 40 mm, aortic root 38 mm, mild LAE  . Hx of cardiovascular stress test    a. GXT 5/14: No ischemic changes  //  b. ETT-Myoview 3/16:  Low risk, no ischemia, EF 58%  . Hyperlipidemia   . Hypertension   . Morbid obesity (HCC)   . Paroxysmal atrial fibrillation (HCC)    Occurring in 2008, with several recurrence since then (including in the setting of + cocaine on UDS).  . Sleep apnea    uses CPAP  . SVT (supraventricular tachycardia) (HCC)    mid to long RP SVT 09/2019    Past Surgical History:  Procedure Laterality Date  . APPENDECTOMY    . ATRIAL FIBRILLATION ABLATION N/A 11/27/2019   Procedure: ATRIAL FIBRILLATION ABLATION;  Surgeon: [name], MD;  [facility];  Service: Cardiovascular;  Laterality: N/A;  . ROTATOR CUFF REPAIR    . SVT ABLATION N/A 11/27/2019   Procedure: SVT ABLATION;  Surgeon: [name], MD;  Location: [facility];  Service: Cardiovascular;  Laterality: N/A;  . TONSILLECTOMY      There were no vitals filed for this visit.   Subjective Assessment - 12/13/20 0853    Subjective  They still don't know why I had the stroke    Pertinent History Acute multifocal cerebral infarcts with R-sided weakness 11/18/20.    PMH: HTN, DM, sleep apnea, narcolepsy, hx of ETOH and cocaine abuse, CKD,  hyperkalemia, Paroxysmal Atrial fibrillation (s/p ablation in 2021), hx of CVA 06/01/20 with L-sided deficits    Limitations no heavy lifting    Patient Stated Goals improve R-sided strength    Currently in Pain? No/denies            Reviewed exercises from coordination HEP:  Flipping cards with min cueing, dealing cards with thumb with min cueing for shoulder compensation, picking up coins and placing in coin bank, stacking coins with min cueing, rotating ball in fingertips with min difficulty.  Manipulating coins in R hand to move palm>fingertips to place in coin bank with min difficulty for coordination.  Attempted shoulder ext in sitting with yellow band, mod cueing for compensation/difficulty.  Standing cane ex for shoulder ext/scapular retraction with min cueing and CGA for balance (with LOB x2).       OT Education - 12/13/20 0912    Education Details Shoulder HEP--see pt instructions (cane and yellow band); Discussed stroke risk factors (per pt questions) and lifestyle changes    Person(s) Educated Patient    Methods Explanation;Demonstration;Handout;Verbal cues;Tactile cues    Comprehension Verbalized understanding;Returned demonstration;Verbal cues required            OT Short Term Goals - 12/13/20 0926      OT SHORT TERM GOAL #1   Title Pt will be independent with HEP for RUE  coordination and strength.--check STGs 12/21/20    Time 4    Period Weeks    Status New      OT SHORT TERM GOAL #2   Title Pt will perform bathing with supervision for transfer.    Time 4    Period Weeks    Status New      OT SHORT TERM GOAL #3   Title Pt will perform UB dressing mod I.    Time 4    Period Weeks    Status New      OT SHORT TERM GOAL #4   Title Pt will peform LB dressing with min A.    Time 4    Period Weeks    Status New      OT SHORT TERM GOAL #5   Title Pt will improve coordination for ADLs as shown by improving time on 9-hole peg test by at least 8sec with R hand.    Baseline 54.4sec    Time 4    Period Weeks    Status New             OT Long Term Goals - 12/08/20 1236      OT LONG TERM GOAL #1   Title Pt will perform simple snack prep and home maintenance tasks mod I.    Time 6    Period Weeks    Status New      OT LONG TERM GOAL #2   Title Pt will improve coordination for ADLs as shown by improving time on 9-hole peg test by at least 15sec with R hand.    Baseline 54.4sec    Time 6    Period Weeks    Status New      OT LONG TERM GOAL #3   Title Pt will improve R grip strength by at least 15lbs to assist in opening containers and lifting objects.    Baseline R-40.1, L-76lbs    Time 6    Period Weeks    Status New      OT LONG TERM GOAL #4   Title Pt will perform LB dressing mod I.    Time 6    Period Weeks    Status New      OT LONG TERM GOAL #5   Title Pt wil perform envirnonmental scanning with 90% or better accuracy    Baseline 14/16 , 87% accuracy located first pass, for basic    Time 6    Period Weeks    Status New      OT LONG TERM GOAL #6   Title ---                 Plan - 12/13/20 9528    Clinical Impression Statement  Pt is progressing towards goals with improving coordination.    OT Occupational Profile and History Problem Focused Assessment - Including review of records relating to presenting problem     Occupational performance deficits (Please refer to evaluation for details): IADL's;ADL's;Leisure;Work    Games developer / Function / Physical Skills IADL;Coordination;FMC;Decreased knowledge of precautions;UE functional use;ADL;Strength;Dexterity;Balance;Vision;Mobility;Sensation    Cognitive Skills Safety Awareness   pt reports long hx of learning difficulties and dyslexia   Rehab Potential Good    Clinical Decision Making Several treatment options, min-mod task modification necessary    Comorbidities Affecting Occupational Performance: May have comorbidities impacting occupational performance    Modification or Assistance to Complete Evaluation  Min-Moderate modification of tasks or assist with assess necessary to complete eval    OT Frequency 2x / week    OT Duration 6 weeks   +eval or 13 visits over extended weeks (due to potential scheduling conflicts)   OT Treatment/Interventions Therapeutic activities;Therapeutic exercise;Cognitive remediation/compensation;Neuromuscular education;Functional Mobility Training;Visual/perceptual remediation/compensation;Patient/family education;Self-care/ADL training;Moist Heat;Fluidtherapy;DME and/or AE instruction;Cryotherapy;Passive range of motion;Manual Therapy;Paraffin    Plan review shoulder HEP, add putty HEP    Consulted and Agree with Plan of Care Patient           Patient will benefit from skilled therapeutic intervention in order to improve the following deficits and impairments:   Body Structure / Function / Physical Skills: IADL,Coordination,FMC,Decreased knowledge of precautions,UE functional use,ADL,Strength,Dexterity,Balance,Vision,Mobility,Sensation Cognitive Skills: Safety Awareness (pt reports long hx of learning difficulties and dyslexia)     Visit Diagnosis: Hemiplegia and hemiparesis following cerebral infarction affecting right dominant side (HCC)  Muscle weakness (generalized)  Other lack of coordination  Unsteadiness on  feet    Problem List Patient Active Problem List   Diagnosis Date Noted  . Cerebral thrombosis with cerebral infarction 11/20/2020  . Right sided weakness 11/19/2020  . Hyperkalemia 11/19/2020  . AKI (acute kidney injury) (HCC)   . Dysarthria   . Amnesia 09/08/2020  . 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 Acute cerebrovascular accident (CVA) (HCC) 06/01/2020  . Paronychia of right thumb 05/10/2020  . Mediastinal adenopathy 01/06/2020  . Secondary hypercoagulable state (HCC) 10/22/2019  . Morbid obesity due to excess calories (HCC) 12/08/2016  . Paroxysmal atrial fibrillation (HCC) 01/23/2013  . Diabetes mellitus (HCC) 01/23/2013  . Hypertension 01/23/2013  . Tobacco user 01/23/2013  . Chronic diastolic heart failure (HCC) 01/21/2013  . 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 Shoulder pain, right 02/15/2011  . Seasonal and perennial allergic rhinitis 02/13/2008  . Dyslipidemia 02/12/2008  . Cocaine abuse (HCC) 02/12/2008  . Obstructive sleep apnea 02/12/2008    [facility] 12/13/2020, 12:06 PM  [facility] [address] [place], [place], [address] Phone: [phone]   Fax:  [phone]  Name: [name]. MRN: [id number] Date of Birth: Jul 06, 1971   Willa Frater, OTR/L [facility] [address] [place], [place]  [address] [phone] phone [phone] 12/13/20 12:06 PM

## 2020-12-13 NOTE — Patient Instructions (Signed)
   Strategies to try while eating: -Reduce distractions (turn off the tv, don't carry a conversation) -Small bites/sips -Chew your food well   Keep items in the same place (ex: remote on nightstand, keys in drawer by the door)

## 2020-12-14 ENCOUNTER — Ambulatory Visit: Payer: 59 | Admitting: Adult Health

## 2020-12-14 ENCOUNTER — Encounter: Payer: 59 | Admitting: Occupational Therapy

## 2020-12-14 ENCOUNTER — Encounter: Payer: Self-pay | Admitting: Adult Health

## 2020-12-14 ENCOUNTER — Telehealth: Payer: Self-pay | Admitting: Internal Medicine

## 2020-12-14 VITALS — BP 160/96 | HR 75 | Ht 69.0 in | Wt 315.0 lb

## 2020-12-14 DIAGNOSIS — I639 Cerebral infarction, unspecified: Secondary | ICD-10-CM | POA: Diagnosis not present

## 2020-12-14 DIAGNOSIS — R269 Unspecified abnormalities of gait and mobility: Secondary | ICD-10-CM

## 2020-12-14 DIAGNOSIS — I69391 Dysphagia following cerebral infarction: Secondary | ICD-10-CM

## 2020-12-14 DIAGNOSIS — I69398 Other sequelae of cerebral infarction: Secondary | ICD-10-CM | POA: Diagnosis not present

## 2020-12-14 DIAGNOSIS — I69322 Dysarthria following cerebral infarction: Secondary | ICD-10-CM

## 2020-12-14 NOTE — Patient Instructions (Addendum)
Continue working with outpatient therapies for hopeful ongoing improvement  You will be called to schedule swallowing study   Continue aspirin 81 mg daily and Eliquis (apixaban) daily  and atorvastatin 80mg  daily  for secondary stroke prevention  Continue to follow with Dr. regarding sleep apnea and difficulty tolerating CPAP  Continue to follow up with PCP regarding cholesterol, blood pressure and diabetes management  Maintain strict control of hypertension with blood pressure goal below 130/90, diabetes with hemoglobin A1c goal below 7.0% and cholesterol with LDL cholesterol (bad cholesterol) goal below 70 mg/dL.      Followup in the future with me in 4 months or call earlier if needed      Thank you for coming to see [name] at [facility]. I hope we have been able to provide you high quality care today.  You may receive a patient satisfaction survey over the next few weeks. We would appreciate your feedback and comments so that we may continue to improve ourselves and the health of our patients.

## 2020-12-14 NOTE — Telephone Encounter (Signed)
Rec'd disability forms from - given to Dr Maple Hudson for review and signature. -pr

## 2020-12-14 NOTE — Progress Notes (Signed)
I agree with the above plan 

## 2020-12-15 ENCOUNTER — Encounter (INDEPENDENT_AMBULATORY_CARE_PROVIDER_SITE_OTHER): Payer: 59 | Admitting: Ophthalmology

## 2020-12-16 ENCOUNTER — Ambulatory Visit: Payer: 59

## 2020-12-16 ENCOUNTER — Encounter: Payer: Self-pay | Admitting: Physical Therapy

## 2020-12-16 ENCOUNTER — Ambulatory Visit: Payer: 59 | Attending: Internal Medicine | Admitting: Occupational Therapy

## 2020-12-16 ENCOUNTER — Ambulatory Visit: Payer: 59 | Admitting: Physical Therapy

## 2020-12-16 ENCOUNTER — Other Ambulatory Visit: Payer: Self-pay

## 2020-12-16 VITALS — BP 186/106

## 2020-12-16 DIAGNOSIS — R471 Dysarthria and anarthria: Secondary | ICD-10-CM

## 2020-12-16 DIAGNOSIS — I69351 Hemiplegia and hemiparesis following cerebral infarction affecting right dominant side: Secondary | ICD-10-CM | POA: Insufficient documentation

## 2020-12-16 DIAGNOSIS — I69318 Other symptoms and signs involving cognitive functions following cerebral infarction: Secondary | ICD-10-CM | POA: Insufficient documentation

## 2020-12-16 DIAGNOSIS — F8089 Other developmental disorders of speech and language: Secondary | ICD-10-CM | POA: Diagnosis present

## 2020-12-16 DIAGNOSIS — R41841 Cognitive communication deficit: Secondary | ICD-10-CM | POA: Insufficient documentation

## 2020-12-16 DIAGNOSIS — R2689 Other abnormalities of gait and mobility: Secondary | ICD-10-CM

## 2020-12-16 DIAGNOSIS — R2681 Unsteadiness on feet: Secondary | ICD-10-CM | POA: Insufficient documentation

## 2020-12-16 DIAGNOSIS — R1310 Dysphagia, unspecified: Secondary | ICD-10-CM

## 2020-12-16 DIAGNOSIS — M6281 Muscle weakness (generalized): Secondary | ICD-10-CM

## 2020-12-16 DIAGNOSIS — Z0289 Encounter for other administrative examinations: Secondary | ICD-10-CM

## 2020-12-16 DIAGNOSIS — R278 Other lack of coordination: Secondary | ICD-10-CM | POA: Diagnosis present

## 2020-12-16 DIAGNOSIS — R41842 Visuospatial deficit: Secondary | ICD-10-CM

## 2020-12-16 NOTE — Therapy (Signed)
[facility] [address] [place], [place], [address] Phone: [phone]   Fax:  [phone]  Physical Therapy Treatment  Patient Details  Name: [name]. MRN: [id number] Date of Birth: Feb 21, 1971 Referring Provider (PT): Miguel Aschoff, MD   Encounter Date: 12/16/2020   PT End of Session - 12/16/20 0935    Visit Number 23    Number of Visits 32    Authorization Type UHC-60 visit limit PT/OT/speech (each visit of each type counts); as of 11/22/2020, pt has had 11 TOTAL visits in 2022    Authorization - Visit Number 11    Authorization - Number of Visits 20    PT Start Time 0932    PT Stop Time 0945   no charge, session ended due to medical issues   PT Time Calculation (min) 13 min    Activity Tolerance Patient tolerated treatment well           Past Medical History:  Diagnosis Date  . Acute pancreatitis 01/22/2019  . Diabetes mellitus   . GERD (gastroesophageal reflux disease)   . History of alcohol abuse   . History of cocaine use   . History of echocardiogram    a. Echo 4/14: Moderate LVH, vigorous LVEF, EF 65-70%, normal wall motion, grade 2 diastolic dysfunction, mildly dilated aortic root and ascending aorta, ascending aorta 40 mm, aortic root 38 mm, mild LAE  . Hx of cardiovascular stress test    a. GXT 5/14: No ischemic changes  //  b. ETT-Myoview 3/16:  Low risk, no ischemia, EF 58%  . Hyperlipidemia   . Hypertension   . Morbid obesity (HCC)   . Paroxysmal atrial fibrillation (HCC)    Occurring in 2008, with several recurrence since then (including in the setting of + cocaine on UDS).  . Sleep apnea    uses CPAP  . SVT (supraventricular tachycardia) (HCC)    mid to long RP SVT 09/2019    Past Surgical History:  Procedure Laterality Date  . APPENDECTOMY    . ATRIAL FIBRILLATION ABLATION N/A 11/27/2019   Procedure: ATRIAL FIBRILLATION ABLATION;  Surgeon: [name], MD;   [facility];  Service: Cardiovascular;  Laterality: N/A;  . ROTATOR CUFF REPAIR    . SVT ABLATION N/A 11/27/2019   Procedure: SVT ABLATION;  Surgeon: [name], MD;  [facility]: [facility];  Service: Cardiovascular;  Laterality: N/A;  . TONSILLECTOMY      Vitals:   12/16/20 0932 12/16/20 0942 12/16/20 0944  BP: (!) 174/117 with automated machine (!) 178/110 manual recheck (!) 186/106 2cd person manual recheck     Subjective Assessment - 12/16/20 0932    Subjective No new complaints. No falls to report. Received a walker, however it was the wrong one (they sent a rollator instead of a standard RW with 5 inch wheels). Called the company and they stated they didn't send any walkers to him. Has paid his copay.    Pertinent History Recent hospitalization 2/4-11/21/20 (Dr. Marvel Plan and RN at d/c recommend OP PT)    Patient Stated Goals Pt's goals for therapy are to get back to where I was with strength and coordination.  11/22/20:  To get back where I was before this stroke.    Currently in Pain? No/denies    Pain Score 0-No pain                   PT Short Term  Goals - 12/13/20 1248      PT SHORT TERM GOAL #1   Title Pt will be independent with revised HEP for improved strength, balance, gait.  TARGET 12/17/2020    Baseline 12/13/20 Pt reports doing current HEP and walking daily.    Time 4    Period Weeks    Status Achieved      PT SHORT TERM GOAL #2   Title Pt will improve TUG score to less than or equal to 18 seconds for decreased fall risk.    Baseline TUG 10.39 sec 12/13/20    Time 4    Period Weeks    Status Achieved      PT SHORT TERM GOAL #3   Title Pt will improve Berg Balance score to at least 19/56 for decreased fall risk.    Baseline 46/56 12/13/20    Time 4    Period Weeks    Status Achieved      PT SHORT TERM GOAL #4   Title Pt will improve 5x sit<>stand to less than or equal to 18 seconds to demonstrate improved functional strength.     Baseline 14.45  12/13/20    Time 4    Status Achieved      PT SHORT TERM GOAL #5   Title Pt will verbalize understanding of fall prevention in home environment.    Time 4    Period Weeks    Status Unable to assess   unable due to time constraints            PT Long Term Goals - 11/22/20 1959      PT LONG TERM GOAL #1   Title Pt will be independent with final progression of HEP for improved gait and balance.  TARGET 01/14/2021    Time 8    Period Weeks    Status Revised      PT LONG TERM GOAL #2   Title Pt will improve TUG score to less than or equal to 13.5 sec for decreased fall risk.    Time 8    Period Weeks    Status New      PT LONG TERM GOAL #3   Title Pt will improve Berg Balance score to at least 35/56 for decreased fall risk.    Time 8    Period Weeks    Status New      PT LONG TERM GOAL #4   Title Pt will improve 5x sit<>stand to less than or equal to 13 sec for improved functional lower extremity strength.    Time 8    Period Weeks    Status New      PT LONG TERM GOAL #5   Title Pt will improve gait velocity to at least 2 ft/sec for improved gait efficiency and decreased fall risk.    Baseline 0.96 ft/sec 11/22/20    Time 8    Period Weeks    Status New      Additional Long Term Goals   Additional Long Term Goals Yes      PT LONG TERM GOAL #6   Title Pt will ambulate at least 1000 ft, indoors and outdoors surfaces, using least restrictive assistive device, mod independently, no LOB, for improved community ambulation.    Time 8    Period Weeks    Status New                 Plan - 12/16/20 0935    Clinical Impression  Statement Pt's BP too elevated to safely participate in PT this session. Pt reminded of CVA signs and symptoms and to call 911 if any occur. Will plan to check remaining STG and continue with PT at next session pending BP being WNL's.    Personal Factors and Comorbidities Comorbidity 3+    Comorbidities diabetes, HTN, morbid  obesity, history of polysubstance abuse (cocaine and alcohol)    Examination-Activity Limitations Locomotion Level;Transfers;Stairs    Examination-Participation Restrictions Community Activity;Yard Work    Stability/Clinical Decision Making Evolving/Moderate complexity    Rehab Potential Good    PT Frequency 2x / week    PT Duration 8 weeks   including week of recert, 11/22/2020   PT Treatment/Interventions ADLs/Self Care Home Management;Gait training;Stair training;Functional mobility training;Therapeutic activities;Therapeutic exercise;Balance training;Neuromuscular re-education;Patient/family education;DME Instruction    PT Next Visit Plan check remaining STG. Curb negotiation and truck transfers with high steps. Update HEP and walking program .   Check on MD order for rolling walker (or was pt able to obtain one on his own?); gait training with RW-curb, ramp, outdoors.  Review HEP from last visit; conitnue to work on RLE Economist.  Please check vitals  (make sure pt has more appts; PT requested 4 additional weeks beyond week of 2/21; we will need to monitor visit number for him, due to VL with insurance, now that he is back to OT and speech). continue with trials of braces for right LE.    PT Home Exercise Plan Access Code: [email or web address]    Consulted and Agree with Plan of Care Patient           Patient will benefit from skilled therapeutic intervention in order to improve the following deficits and impairments:  Abnormal gait,Difficulty walking,Decreased balance,Decreased mobility  Visit Diagnosis: Muscle weakness (generalized)  Hemiplegia and hemiparesis following cerebral infarction affecting right dominant side (HCC)  Other abnormalities of gait and mobility     Problem List Patient Active Problem List   Diagnosis Date Noted  . Cerebral thrombosis with cerebral infarction 11/20/2020  . Right sided weakness 11/19/2020  . Hyperkalemia 11/19/2020  . AKI (acute kidney  injury) (HCC)   . Dysarthria   . Amnesia 09/08/2020  . 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 Acute cerebrovascular accident (CVA) (HCC) 06/01/2020  . Paronychia of right thumb 05/10/2020  . Mediastinal adenopathy 01/06/2020  . Secondary hypercoagulable state (HCC) 10/22/2019  . Morbid obesity due to excess calories (HCC) 12/08/2016  . Paroxysmal atrial fibrillation (HCC) 01/23/2013  . Diabetes mellitus (HCC) 01/23/2013  . Hypertension 01/23/2013  . Tobacco user 01/23/2013  . Chronic diastolic heart failure (HCC) 01/21/2013  . 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 Shoulder pain, right 02/15/2011  . Seasonal and perennial allergic rhinitis 02/13/2008  . Dyslipidemia 02/12/2008  . Cocaine abuse (HCC) 02/12/2008  . Obstructive sleep apnea 02/12/2008   Sallyanne Kuster, PTA, [facility] [address] [place], [place] [address] [phone] 12/16/20, 9:53 AM   Name: Early Steel. MRN: [id number] Date of Birth: 16-Sep-1971

## 2020-12-16 NOTE — Patient Instructions (Signed)
Speech Strategies:   SLOW LOUD OVER-ENNUNCIATE (move your mouth big for each sound) PAUSE (take a deep breath)   **Ask a question so you can get a break during long phone calls with medical professionals     Swallow strategies:  -Reduce distractions (turn off the tv, don't carry on a conversation) -Slow down -Small sips/bites  -Chew your food well

## 2020-12-16 NOTE — Patient Instructions (Signed)
   Extension (Assistive Putty)   Roll putty back and forth, being sure to use all fingertips. Repeat 3 times. Do 1-2 sessions per day.  Then pinch as below.   Palmar Pinch Strengthening (Resistive Putty)   Pinch putty between thumb and each fingertip in turn after rolling out     Grip Strengthening (Resistive Putty)   Squeeze putty using thumb and all fingers. Repeat 25 times. Do 1-2 sessions per day.

## 2020-12-16 NOTE — Therapy (Signed)
[facility] [address] [place], [place], [address] Phone: [phone]   Fax:  [phone]  Occupational Therapy Treatment  Patient Details  Name: [name]. MRN: [id number] Date of Birth: 09-Feb-1971 Referring Provider (OT): Dr. Shanon Brow   Encounter Date: 12/16/2020   OT End of Session - 12/16/20 0814    Visit Number 4    Number of Visits 13    Date for OT Re-Evaluation 01/07/21    Authorization Type [employer]--copay per day, VL:60 each PT/OT/ST.   (3 previous OT visits used this year)    Authorization - Visit Number 16    Authorization - Number of Visits 60    OT Start Time [phone]   pt arrived late   OT Stop Time 0845    OT Time Calculation (min) 33 min    Activity Tolerance Patient tolerated treatment well    Behavior During Therapy WFL for tasks assessed/performed           Past Medical History:  Diagnosis Date  . Acute pancreatitis 01/22/2019  . Diabetes mellitus   . GERD (gastroesophageal reflux disease)   . History of alcohol abuse   . History of cocaine use   . History of echocardiogram    a. Echo 4/14: Moderate LVH, vigorous LVEF, EF 65-70%, normal wall motion, grade 2 diastolic dysfunction, mildly dilated aortic root and ascending aorta, ascending aorta 40 mm, aortic root 38 mm, mild LAE  . Hx of cardiovascular stress test    a. GXT 5/14: No ischemic changes  //  b. ETT-Myoview 3/16:  Low risk, no ischemia, EF 58%  . Hyperlipidemia   . Hypertension   . Morbid obesity (HCC)   . Paroxysmal atrial fibrillation (HCC)    Occurring in 2008, with several recurrence since then (including in the setting of + cocaine on UDS).  . Sleep apnea    uses CPAP  . SVT (supraventricular tachycardia) (HCC)    mid to long RP SVT 09/2019    Past Surgical History:  Procedure Laterality Date  . APPENDECTOMY    . ATRIAL FIBRILLATION ABLATION N/A 11/27/2019   Procedure: ATRIAL FIBRILLATION ABLATION;   Surgeon: [name], MD;  [facility]: [facility];  Service: Cardiovascular;  Laterality: N/A;  . ROTATOR CUFF REPAIR    . SVT ABLATION N/A 11/27/2019   Procedure: SVT ABLATION;  Surgeon: [name], MD;  Location: [facility];  Service: Cardiovascular;  Laterality: N/A;  . TONSILLECTOMY      There were no vitals filed for this visit.   Subjective Assessment - 12/16/20 0812    Subjective  I took my medicine this morning    Pertinent History Acute multifocal cerebral infarcts with R-sided weakness 11/18/20.    PMH: HTN, DM, sleep apnea, narcolepsy, hx of ETOH and cocaine abuse, CKD,  hyperkalemia, Paroxysmal Atrial fibrillation (s/p ablation in 2021), hx of CVA 06/01/20 with L-sided deficits    Limitations no heavy lifting    Patient Stated Goals improve R-sided strength    Currently in Pain? No/denies           Reviewed shoulder HEP (cane ex for shoulder flex and abduction and yellow band ex for horizontal abduction and rows for scapular strength).  Pt returned demo each x10 (band ex)-15 (cane ex) with min v.c.  Functional reaching in sitting/standing (mid-level due to compensation patterns with overhead reach) with RUE to place/remove clothespins with 1-8lb resistance on vertical pole for incr  strength/ROM.  Placing grooved pegs in pegboard with min-mod difficulty for incr coordination.       OT Education - 12/16/20 0814    Education Details [place] [employer] HEP--see pt instructions    Person(s) Educated Patient    Methods Explanation;Demonstration;Handout;Verbal cues    Comprehension Verbalized understanding;Returned demonstration;Verbal cues required            OT Short Term Goals - 12/16/20 0816      OT SHORT TERM GOAL #1   Title Pt will be independent with HEP for RUE coordination and strength.--check STGs 12/24/20 (extended due to seen for decr frequency initially)    Time 4    Period Weeks    Status New      OT SHORT TERM GOAL #2   Title Pt will perform  bathing with supervision for transfer.    Time 4    Period Weeks    Status On-going      OT SHORT TERM GOAL #3   Title Pt will perform UB dressing mod I.    Time 4    Period Weeks    Status Achieved      OT SHORT TERM GOAL #4   Title Pt will peform LB dressing with min A.    Time 4    Period Weeks    Status Achieved   12/16/20:  supervision     OT SHORT TERM GOAL #5   Title Pt will improve coordination for ADLs as shown by improving time on 9-hole peg test by at least 8sec with R hand.    Baseline 54.4sec    Time 4    Period Weeks    Status New             OT Long Term Goals - 12/08/20 1236      OT LONG TERM GOAL #1   Title Pt will perform simple snack prep and home maintenance tasks mod I.    Time 6    Period Weeks    Status New      OT LONG TERM GOAL #2   Title Pt will improve coordination for ADLs as shown by improving time on 9-hole peg test by at least 15sec with R hand.    Baseline 54.4sec    Time 6    Period Weeks    Status New      OT LONG TERM GOAL #3   Title Pt will improve R grip strength by at least 15lbs to assist in opening containers and lifting objects.    Baseline R-40.1, L-76lbs    Time 6    Period Weeks    Status New      OT LONG TERM GOAL #4   Title Pt will perform LB dressing mod I.    Time 6    Period Weeks    Status New      OT LONG TERM GOAL #5   Title Pt wil perform envirnonmental scanning with 90% or better accuracy    Baseline 14/16 , 87% accuracy located first pass, for basic    Time 6    Period Weeks    Status New      OT LONG TERM GOAL #6   Title ---                 Plan - 12/16/20 0815    Clinical Impression Statement Pt is progressing towards goals with improving RUE coordination and strength.    OT Occupational Profile and History  Problem Focused Assessment - Including review of records relating to presenting problem    Occupational performance deficits (Please refer to evaluation for details):  IADL's;ADL's;Leisure;Work    Games developer / Function / Physical Skills IADL;Coordination;FMC;Decreased knowledge of precautions;UE functional use;ADL;Strength;Dexterity;Balance;Vision;Mobility;Sensation    Cognitive Skills Safety Awareness   pt reports long hx of learning difficulties and dyslexia   Rehab Potential Good    Clinical Decision Making Several treatment options, min-mod task modification necessary    Comorbidities Affecting Occupational Performance: May have comorbidities impacting occupational performance    Modification or Assistance to Complete Evaluation  Min-Moderate modification of tasks or assist with assess necessary to complete eval    OT Frequency 2x / week    OT Duration 6 weeks   +eval or 13 visits over extended weeks (due to potential scheduling conflicts)   OT Treatment/Interventions Therapeutic activities;Therapeutic exercise;Cognitive remediation/compensation;Neuromuscular education;Functional Mobility Training;Visual/perceptual remediation/compensation;Patient/family education;Self-care/ADL training;Moist Heat;Fluidtherapy;DME and/or AE instruction;Cryotherapy;Passive range of motion;Manual Therapy;Paraffin    Plan continue to address RUE strength and coordination    Consulted and Agree with Plan of Care Patient           Patient will benefit from skilled therapeutic intervention in order to improve the following deficits and impairments:   Body Structure / Function / Physical Skills: IADL,Coordination,FMC,Decreased knowledge of precautions,UE functional use,ADL,Strength,Dexterity,Balance,Vision,Mobility,Sensation Cognitive Skills: Safety Awareness (pt reports long hx of learning difficulties and dyslexia)     Visit Diagnosis: Muscle weakness (generalized)  Other lack of coordination  Unsteadiness on feet  Visuospatial deficit    Problem List Patient Active Problem List   Diagnosis Date Noted  . Cerebral thrombosis with cerebral infarction  11/20/2020  . Right sided weakness 11/19/2020  . Hyperkalemia 11/19/2020  . AKI (acute kidney injury) (HCC)   . Dysarthria   . Amnesia 09/08/2020  . 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 Acute cerebrovascular accident (CVA) (HCC) 06/01/2020  . Paronychia of right thumb 05/10/2020  . Mediastinal adenopathy 01/06/2020  . Secondary hypercoagulable state (HCC) 10/22/2019  . Morbid obesity due to excess calories (HCC) 12/08/2016  . Paroxysmal atrial fibrillation (HCC) 01/23/2013  . Diabetes mellitus (HCC) 01/23/2013  . Hypertension 01/23/2013  . Tobacco user 01/23/2013  . Chronic diastolic heart failure (HCC) 01/21/2013  . 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 Shoulder pain, right 02/15/2011  . Seasonal and perennial allergic rhinitis 02/13/2008  . Dyslipidemia 02/12/2008  . Cocaine abuse (HCC) 02/12/2008  . Obstructive sleep apnea 02/12/2008    [facility] 12/16/2020, 8:19 AM  [facility] [address] [place], [place], [address] Phone: [phone]   Fax:  [phone]  Name: Milik Gilreath. MRN: [id number] Date of Birth: 1971/06/10   Willa Frater, OTR/L [facility] [address] [place], [place]  [address] [phone] phone [phone] 12/16/20 8:19 AM

## 2020-12-16 NOTE — Therapy (Signed)
[facility] [address] [place], [name], [address] Phone: [phone]   Fax:  [phone]  Speech Language Pathology Treatment  Patient Details  Name: [name]. MRN: [id number] Date of Birth: 23-May-1971 Referring Provider (SLP): Frann Rider, NP   Encounter Date: 12/16/2020   End of Session - 12/16/20 0918    Visit Number 11    Number of Visits 17    Date for SLP Re-Evaluation 12/31/20    SLP Start Time 0846    SLP Stop Time  0930    SLP Time Calculation (min) 44 min    Activity Tolerance Patient tolerated treatment well           Past Medical History:  Diagnosis Date  . Acute pancreatitis 01/22/2019  . Diabetes mellitus   . GERD (gastroesophageal reflux disease)   . History of alcohol abuse   . History of cocaine use   . History of echocardiogram    a. Echo 4/14: Moderate LVH, vigorous LVEF, EF 65-70%, normal wall motion, grade 2 diastolic dysfunction, mildly dilated aortic root and ascending aorta, ascending aorta 40 mm, aortic root 38 mm, mild LAE  . Hx of cardiovascular stress test    a. GXT 5/14: No ischemic changes  //  b. ETT-Myoview 3/16:  Low risk, no ischemia, EF 58%  . Hyperlipidemia   . Hypertension   . Morbid obesity ([place])   . Paroxysmal atrial fibrillation ([place])    Occurring in 2008, with several recurrence since then (including in the setting of + cocaine on UDS).  . Sleep apnea    uses CPAP  . SVT (supraventricular tachycardia) ([place])    mid to long RP SVT 09/2019    Past Surgical History:  Procedure Laterality Date  . APPENDECTOMY    . ATRIAL FIBRILLATION ABLATION N/A 11/27/2019   Procedure: ATRIAL FIBRILLATION ABLATION;  Surgeon: Thompson Grayer, MD;  Location: [place] [facility];  Service: Cardiovascular;  Laterality: N/A;  . ROTATOR CUFF REPAIR    . SVT ABLATION N/A 11/27/2019   Procedure: SVT ABLATION;  Surgeon: Thompson Grayer, MD;  Location: [place] [facility];   Service: Cardiovascular;  Laterality: N/A;  . TONSILLECTOMY      There were no vitals filed for this visit.   Subjective Assessment - 12/16/20 0849    Subjective "I'm feeling better"    Currently in Pain? No/denies    Pain Score 0-No pain                 ADULT SLP TREATMENT - 12/16/20 0850      General Information   Behavior/Cognition Cooperative;Alert;Pleasant mood      Treatment Provided   Treatment provided Cognitive-Linquistic;Dysphagia      Dysphagia Treatment   Other treatment/comments Pt had recent neuro appointment, with pt to receive call to schedule swallow study. SLP reviewed swallow strategies to reduce coughing during meals. Pt able to ID strategy x1 to reduce distractions. SLP re-educated patient on increased mastication, slow rate, and small bites/sips.      Cognitive-Linquistic Treatment   Treatment focused on Dysarthria    Skilled Treatment Pt reports some difficulty with long phone calls (~15 mins) which causes "bubble gum" sensation. SLP educated techniques to ask questions to take break. SLP reviewed SLOP. Pt deemed approximately 85% intelligible in conversation.      Assessment / Recommendations / Plan   Plan Continue with current plan of care      Progression Toward Goals  Progression toward goals Progressing toward goals            SLP Education - 12/16/20 0916    Education Details swallow strategies, voice compensations    Person(s) Educated Patient    Methods Explanation;Demonstration;Handout    Comprehension Verbalized understanding;Returned demonstration;Need further instruction            SLP Short Term Goals - 12/16/20 0920      SLP SHORT TERM GOAL #1   Title pt will tell SLP three ways to compensate for his memory in 3 sessions, with modifeid independence    Baseline 11-05-20, 11-08-20 (calendar), 12-03-20 (phone alarms for meds)    Status Achieved      SLP SHORT TERM GOAL #2   Title pt will demonstrate selective attention to  simple-mod complex task for 8 mintues in quiet environment in 2 sessions    Baseline 10-14-20, 11-05-20    Status Partially Met      SLP SHORT TERM GOAL #3   Title pt will demo speech compensations for intelligibility 80% in 10 minutes simple conversation    Baseline 12-13-20    Status Achieved   denies ongoing dysarthria     SLP SHORT TERM GOAL #4   Title pt will perform HEP for dysarthria with occasional min A for exaggeration of speech sounds    Period Weeks    Status On-going            SLP Long Term Goals - 12/16/20 7322      SLP LONG TERM GOAL #1   Title pt will demo simple divided attention skills in functinoal cogntive communcation tasks in 3 sessions    Time 2    Period Weeks   or 17 visits, for all LTGs   Status On-going      SLP LONG TERM GOAL #2   Title pt will report improving swallow skills by telling SLP frequency of cough during liquid consumption is once/month    Time 2    Period Weeks    Status On-going      SLP LONG TERM GOAL #3   Title pt will use memory compensations for appointments, medications, exercise/homework tracking, and/or to-do lists, etc in 5 sessions    Baseline 11-05-20 (meds), 12-03-20, 12-10-20, 12-13-20    Time 2    Period Weeks    Status On-going            Plan - 12/16/20 0254    Clinical Impression Statement Jaimere presents today with premorbid attention deficit, and told SLP he thinks his dysarthria is (mostly) resolved. SLP reviewed compensations for longer phone conversations. Pt has told SLP he has some occasional coughing with water and food getting "stuck." Pt awaiting call to schedule MBSS. SLP reviewed swallow strategies to trial at home. Pt would cont to benefit from skilled ST addressing resolving dysarthria, and possible dysphagia.    Speech Therapy Frequency 2x / week    Duration 8 weeks    Treatment/Interventions Aspiration precaution training;Pharyngeal strengthening exercises;Diet toleration management by SLP;Trials of  upgraded texture/liquids;Cueing hierarchy;Cognitive reorganization;Internal/external aids;Patient/family education;Compensatory strategies;SLP instruction and feedback;Functional tasks    Potential to Achieve Goals Good    Potential Considerations Previous level of function    SLP Home Exercise Plan dysarthria HEP, swallow strats    Consulted and Agree with Plan of Care Patient           Patient will benefit from skilled therapeutic intervention in order to improve the following deficits and impairments:  Dysarthria and anarthria  Dysphagia, unspecified type    Problem List Patient Active Problem List   Diagnosis Date Noted  . Cerebral thrombosis with cerebral infarction 11/20/2020  . Right sided weakness 11/19/2020  . Hyperkalemia 11/19/2020  . AKI (acute kidney injury) ([place])   . Dysarthria   . Amnesia 09/08/2020  . 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 Acute cerebrovascular accident (CVA) ([place]) 06/01/2020  . Paronychia of right thumb 05/10/2020  . Mediastinal adenopathy 01/06/2020  . Secondary hypercoagulable state ([place]) 10/22/2019  . Morbid obesity due to excess calories ([place]) 12/08/2016  . Paroxysmal atrial fibrillation ([place]) 01/23/2013  . Diabetes mellitus ([place]) 01/23/2013  . Hypertension 01/23/2013  . Tobacco user 01/23/2013  . Chronic diastolic heart failure ([place]) 01/21/2013  . 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 Shoulder pain, right 02/15/2011  . Seasonal and perennial allergic rhinitis 02/13/2008  . Dyslipidemia 02/12/2008  . Cocaine abuse ([place]) 02/12/2008  . Obstructive sleep apnea 02/12/2008    Alinda Deem, MA CCC-SLP 12/16/2020, 9:24 AM  [facility] [address] [place], [name], [address] Phone: [phone]   Fax:  [phone]   Name: Spiro Ausborn. MRN: [id number] Date of Birth: 1971/04/01

## 2020-12-16 NOTE — Telephone Encounter (Signed)
Rec'd form back - Marisue Ivan dropped charge. Called and spoke to patient sister, paid fee over the phone. Faxed forms to [facility] [employer] at [phone], mailed copy of forms and receipt to patient sister. -pr

## 2020-12-17 ENCOUNTER — Other Ambulatory Visit: Payer: Self-pay | Admitting: Internal Medicine

## 2020-12-17 ENCOUNTER — Other Ambulatory Visit: Payer: Self-pay | Admitting: Student

## 2020-12-17 DIAGNOSIS — F17200 Nicotine dependence, unspecified, uncomplicated: Secondary | ICD-10-CM

## 2020-12-20 ENCOUNTER — Telehealth: Payer: Self-pay | Admitting: Internal Medicine

## 2020-12-20 MED ORDER — AMPHETAMINE-DEXTROAMPHET ER 30 MG PO CP24: 30.0000 mg | ORAL_CAPSULE | Freq: Every day | ORAL | 0 refills | Status: DC | PRN

## 2020-12-20 NOTE — Telephone Encounter (Signed)
Adderall refilled

## 2020-12-20 NOTE — Telephone Encounter (Signed)
Pt calling requesting to have his Adderall Rx refilled. Preferred pharmacy is [facility] [address]  Dr. Maple Hudson, please advise.  Allergies  Allergen Reactions  . Shrimp [Shellfish Allergy] Anaphylaxis  . Banana Other (See Comments)    Pt gags  . Watermelon Flavor Nausea And Vomiting  . Other Itching    Grapes cause itching  . Almond Oil Itching    Roof of mouth itches  . Peanut-Containing Drug Products Itching and Cough  . Sulfa Antibiotics Itching     Current Outpatient Medications:  .  amLODipine (NORVASC) 5 MG tablet, Take 2 tablets (10 mg total) by mouth daily., Disp: 30 tablet, Rfl: 11 .  amphetamine-dextroamphetamine (ADDERALL XR) 30 MG 24 hr capsule, Take 1 capsule (30 mg total) by mouth daily as needed (focus). 1 daily as needed, Disp: 30 capsule, Rfl: 0 .  apixaban (ELIQUIS) 5 MG TABS tablet, Take 1 tablet (5 mg total) by mouth 2 (two) times daily., Disp: 60 tablet, Rfl: 0 .  aspirin EC 81 MG EC tablet, Take 1 tablet (81 mg total) by mouth daily. Swallow whole., Disp: 30 tablet, Rfl: 11 .  atorvastatin (LIPITOR) 80 MG tablet, Take 1 tablet (80 mg total) by mouth daily., Disp: 30 tablet, Rfl: 0 .  Blood Pressure Monitoring (BLOOD PRESSURE MONITOR/L CUFF) MISC, 1 Units by Does not apply route daily., Disp: 1 each, Rfl: 0 .  Dulaglutide (TRULICITY) 0.75 MG/0.5ML SOPN, Inject 0.75 mg into the skin once a week. (Patient taking differently: Inject 0.75 mg into the skin every Monday.), Disp: 2 mL, Rfl: 5 .  flecainide (TAMBOCOR) 100 MG tablet, Take 1 tablet (100 mg total) by mouth 2 (two) times daily., Disp: 180 tablet, Rfl: 3 .  glipiZIDE (GLUCOTROL) 10 MG tablet, Take 10 mg by mouth 2 (two) times daily before a meal., Disp: , Rfl:  .  hydrochlorothiazide (MICROZIDE) 12.5 MG capsule, TAKE 2 CAPSULES BY MOUTH DAILY., Disp: 30 capsule, Rfl: 0 .  lactose free nutrition (BOOST) LIQD, Take 237 mLs by mouth daily., Disp: , Rfl:  .  metFORMIN (GLUCOPHAGE) 500 MG tablet, Take 1,000 mg by  mouth 2 (two) times daily with a meal. , Disp: , Rfl:  .  metoprolol succinate (TOPROL XL) 100 MG 24 hr tablet, Take 1 tablet (100 mg total) by mouth daily. Take with or immediately following a meal., Disp: 30 tablet, Rfl: 11 .  nicotine (NICODERM CQ - DOSED IN MG/24 HOURS) 21 mg/24hr patch, PLACE 1 PATCH (21 MG TOTAL) ONTO THE SKIN DAILY., Disp: 28 patch, Rfl: 1 .  pantoprazole (PROTONIX) 40 MG tablet, Take 1 tablet (40 mg total) by mouth daily. (Patient taking differently: Take 40 mg by mouth daily with supper.), Disp: 30 tablet, Rfl: 5 .  sodium polystyrene (KAYEXALATE) powder, Take by mouth in the morning and at bedtime., Disp: 450 each, Rfl: 0

## 2020-12-20 NOTE — Telephone Encounter (Signed)
Called and spoke with pt letting him know that [facility] refilled his Rx and he verbalized understanding. Nothing further needed.

## 2020-12-21 ENCOUNTER — Ambulatory Visit: Payer: 59 | Admitting: Occupational Therapy

## 2020-12-21 ENCOUNTER — Other Ambulatory Visit: Payer: Self-pay

## 2020-12-21 ENCOUNTER — Encounter: Payer: Self-pay | Admitting: Physical Therapy

## 2020-12-21 ENCOUNTER — Ambulatory Visit: Payer: 59

## 2020-12-21 ENCOUNTER — Encounter: Payer: Self-pay | Admitting: Occupational Therapy

## 2020-12-21 ENCOUNTER — Ambulatory Visit: Payer: 59 | Admitting: Physical Therapy

## 2020-12-21 VITALS — BP 145/89

## 2020-12-21 VITALS — BP 150/86

## 2020-12-21 DIAGNOSIS — R2681 Unsteadiness on feet: Secondary | ICD-10-CM

## 2020-12-21 DIAGNOSIS — R2689 Other abnormalities of gait and mobility: Secondary | ICD-10-CM

## 2020-12-21 DIAGNOSIS — R471 Dysarthria and anarthria: Secondary | ICD-10-CM

## 2020-12-21 DIAGNOSIS — I69351 Hemiplegia and hemiparesis following cerebral infarction affecting right dominant side: Secondary | ICD-10-CM

## 2020-12-21 DIAGNOSIS — M6281 Muscle weakness (generalized): Secondary | ICD-10-CM | POA: Diagnosis not present

## 2020-12-21 DIAGNOSIS — R41841 Cognitive communication deficit: Secondary | ICD-10-CM

## 2020-12-21 DIAGNOSIS — R41842 Visuospatial deficit: Secondary | ICD-10-CM

## 2020-12-21 DIAGNOSIS — I69318 Other symptoms and signs involving cognitive functions following cerebral infarction: Secondary | ICD-10-CM

## 2020-12-21 DIAGNOSIS — R1310 Dysphagia, unspecified: Secondary | ICD-10-CM

## 2020-12-21 DIAGNOSIS — R278 Other lack of coordination: Secondary | ICD-10-CM

## 2020-12-21 NOTE — Progress Notes (Signed)
This encounter was created in error - please disregard.

## 2020-12-21 NOTE — Patient Instructions (Addendum)
It is important to avoid accidents which may result in broken bones.  Here are a few ideas on how to make your home safer so you will be less likely to trip or fall.  1. Use nonskid mats or non slip strips in your shower or tub, on your bathroom floor and around sinks.  If you know that you have spilled water, wipe it up! 2. In the bathroom, it is important to have properly installed grab bars on the walls or on the edge of the tub.  Towel racks are NOT strong enough for you to hold onto or to pull on for support. 3. Stairs and hallways should have enough light.  Add lamps or night lights if you need ore light. 4. It is good to have handrails on both sides of the stairs if possible.  Always fix broken handrails right away. 5. It is important to see the edges of steps.  Paint the edges of outdoor steps white so you can see them better.  Put colored tape on the edge of inside steps. 6. Throw-rugs are dangerous because they can slide.  Removing the rugs is the best idea, but if they must stay, add adhesive carpet tape to prevent slipping. 7. Do not keep things on stairs or in the halls.  Remove small furniture that blocks the halls as it may cause you to trip.  Keep telephone and electrical cords out of the way where you walk. 8. Always were sturdy, rubber-soled shoes for good support.  Never wear just socks, especially on the stairs.  Socks may cause you to slip or fall.  Do not wear full-length housecoats as you can easily trip on the bottom.  9. Place the things you use the most on the shelves that are the easiest to reach.  If you use a stepstool, make sure it is in good condition.  If you feel unsteady, DO NOT climb, ask for help. 10. If a health professional advises you to use a cane or walker, do not be ashamed.  These items can keep you from falling and breaking your bones.   Access Code: [email or web address] URL: [email or web address] Date: 12/21/2020 Prepared by: Lonia Blood  Exercises Sit to Stand with Counter Support - 1 x daily - 5 x weekly - 1 sets - 10 reps Seated Knee Extension with Resistance - 1 x daily - 5 x weekly - 3 sets - 10 reps Seated Hamstring Curl with Anchored Resistance - 1 x daily - 5 x weekly - 3 sets - 10 reps Heel Toe Raises with Counter Support - 1 x daily - 5 x weekly - 3 sets - 10 reps

## 2020-12-21 NOTE — Therapy (Signed)
[facility] [address] [place], [name], [address] Phone: [phone]   Fax:  [phone]  Speech Language Pathology Treatment  Patient Details  Name: [name]. MRN: [id number] Date of Birth: 08/22/1971 Referring Provider (SLP): Frann Rider, NP   Encounter Date: 12/21/2020   End of Session - 12/21/20 0931    Visit Number 12    Number of Visits 17    Date for SLP Re-Evaluation 12/31/20    SLP Start Time 0808    SLP Stop Time  0846    SLP Time Calculation (min) 38 min    Activity Tolerance Patient tolerated treatment well           Past Medical History:  Diagnosis Date  . Acute pancreatitis 01/22/2019  . Diabetes mellitus   . GERD (gastroesophageal reflux disease)   . History of alcohol abuse   . History of cocaine use   . History of echocardiogram    a. Echo 4/14: Moderate LVH, vigorous LVEF, EF 65-70%, normal wall motion, grade 2 diastolic dysfunction, mildly dilated aortic root and ascending aorta, ascending aorta 40 mm, aortic root 38 mm, mild LAE  . Hx of cardiovascular stress test    a. GXT 5/14: No ischemic changes  //  b. ETT-Myoview 3/16:  Low risk, no ischemia, EF 58%  . Hyperlipidemia   . Hypertension   . Morbid obesity ([place])   . Paroxysmal atrial fibrillation ([place])    Occurring in 2008, with several recurrence since then (including in the setting of + cocaine on UDS).  . Sleep apnea    uses CPAP  . SVT (supraventricular tachycardia) ([place])    mid to long RP SVT 09/2019    Past Surgical History:  Procedure Laterality Date  . APPENDECTOMY    . ATRIAL FIBRILLATION ABLATION N/A 11/27/2019   Procedure: ATRIAL FIBRILLATION ABLATION;  Surgeon: Thompson Grayer, MD;  Location: [facility];  Service: Cardiovascular;  Laterality: N/A;  . ROTATOR CUFF REPAIR    . SVT ABLATION N/A 11/27/2019   Procedure: SVT ABLATION;  Surgeon: Thompson Grayer, MD;  Location: [facility];   Service: Cardiovascular;  Laterality: N/A;  . TONSILLECTOMY      There were no vitals filed for this visit.   Subjective Assessment - 12/21/20 0816    Subjective "When I get paid then maybe I can get (the Adderall). It's over there waiting."    Currently in Pain? No/denies                 ADULT SLP TREATMENT - 12/21/20 0823      General Information   Behavior/Cognition Cooperative;Alert;Pleasant mood      Treatment Provided   Treatment provided Cognitive-Linquistic      Dysphagia Treatment   Other treatment/comments SLP assessed pt's swallow ability with peanut butter crackers and water - pt told SLP that last SLP Dalbert Batman) reiterated that he needed to turn off TV and not have any distractions while eating or drinking. "I was drinking the other day, and just as I swallowed I was thinking 'don't get strangled' and I got strangled." SLP suggested again that pt obtain thickener if he should cough with liquids >once/day. No overt s/s of oral or pharyngeal difficulty with solids (x7 bites) or liquids (x4 sips).[name] told SLP he favors chewing on his lt side due to some pain on his rt side since just before the latest CVA. SLP has contacted scheduler for MBSS and inquired  of pt's referral status for that exam.      Cognitive-Linquistic Treatment   Treatment focused on Dysarthria    Skilled Treatment Pt reports his talking and his thinking are primarily at baseline. He is waiting to get paid and then will obtain Adderall which he states he was notified yesterday is at the [facility] on [address] (confirmed via [employer]).      Assessment / Recommendations / Plan   Plan Continue with current plan of care      Progression Toward Goals   Progression toward goals --   awaiting MBSS scheduling/completion           SLP Education - 12/21/20 0931    Education Details reiterated to reduce distractions while eating    Person(s) Educated Patient    Methods Explanation    Comprehension  Verbalized understanding            SLP Short Term Goals - 12/16/20 0920      SLP SHORT TERM GOAL #1   Title pt will tell SLP three ways to compensate for his memory in 3 sessions, with modifeid independence    Baseline 11-05-20, 11-08-20 (calendar), 12-03-20 (phone alarms for meds)    Status Achieved      SLP SHORT TERM GOAL #2   Title pt will demonstrate selective attention to simple-mod complex task for 8 mintues in quiet environment in 2 sessions    Baseline 10-14-20, 11-05-20    Status Partially Met      SLP SHORT TERM GOAL #3   Title pt will demo speech compensations for intelligibility 80% in 10 minutes simple conversation    Baseline 12-13-20    Status Achieved   denies ongoing dysarthria     SLP SHORT TERM GOAL #4   Title pt will perform HEP for dysarthria with occasional min A for exaggeration of speech sounds    Period Weeks    Status On-going            SLP Long Term Goals - 12/21/20 0935      SLP LONG TERM GOAL #1   Title pt will demo simple divided attention skills in functinoal cogntive communcation tasks in 3 sessions    Period --   or 17 visits, for all LTGs   Status Deferred   pt at baseline, per his report     SLP LONG TERM GOAL #2   Title pt will report improving swallow skills by telling SLP frequency of cough during liquid consumption is once/month    Time 1    Period Weeks    Status On-going      SLP LONG TERM GOAL #3   Title pt will use memory compensations for appointments, medications, exercise/homework tracking, and/or to-do lists, etc in 5 sessions    Baseline 11-05-20 (meds), 12-03-20, 12-10-20, 12-13-20    Status Achieved      SLP LONG TERM GOAL #4   Title pt will undergo objective swallow assessment PRN    Time 1    Period Weeks    Status New            Plan - 12/21/20 0931    Clinical Impression Statement [name] presents today with premorbid attention deficit, We are awaiting scheduling of MBSS - SLP contacted OP MBSS scheduler and  inquired on status of order (reportedly) placed on 12-14-20. [name] told SLP today that speech and thinking are primarily at baseline. Pt would cont to benefit from skilled ST addressing  possible dysphagia.    Speech Therapy Frequency 2x / week    Duration 8 weeks    Treatment/Interventions Aspiration precaution training;Pharyngeal strengthening exercises;Diet toleration management by SLP;Trials of upgraded texture/liquids;Cueing hierarchy;Cognitive reorganization;Internal/external aids;Patient/family education;Compensatory strategies;SLP instruction and feedback;Functional tasks    Potential to Achieve Goals Good    Potential Considerations Previous level of function    SLP Home Exercise Plan dysarthria HEP, swallow strats    Consulted and Agree with Plan of Care Patient           Patient will benefit from skilled therapeutic intervention in order to improve the following deficits and impairments:   Dysarthria and anarthria  Cognitive communication deficit  Dysphagia, unspecified type    Problem List Patient Active Problem List   Diagnosis Date Noted  . Cerebral thrombosis with cerebral infarction 11/20/2020  . Right sided weakness 11/19/2020  . Hyperkalemia 11/19/2020  . AKI (acute kidney injury) ([place])   . Dysarthria   . Amnesia 09/08/2020  . 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 Acute cerebrovascular accident (CVA) ([place]) 06/01/2020  . Paronychia of right thumb 05/10/2020  . Mediastinal adenopathy 01/06/2020  . Secondary hypercoagulable state ([place]) 10/22/2019  . Morbid obesity due to excess calories ([place]) 12/08/2016  . Paroxysmal atrial fibrillation ([place]) 01/23/2013  . Diabetes mellitus ([place]) 01/23/2013  . Hypertension 01/23/2013  . Tobacco user 01/23/2013  . Chronic diastolic heart failure ([place]) 01/21/2013  . 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 Shoulder pain, right 02/15/2011  . Seasonal and perennial allergic rhinitis 02/13/2008  . Dyslipidemia  02/12/2008  . Cocaine abuse ([place]) 02/12/2008  . Obstructive sleep apnea 02/12/2008    [facility] ,MS, CCC-SLP  12/21/2020, 9:37 AM  [facility] [address] [place], [name], [address] Phone: [phone]   Fax:  [phone]   Name: [name]. MRN: [id number] Date of Birth: [birth year]

## 2020-12-21 NOTE — Therapy (Signed)
[facility] [address] [place], [place], [address] Phone: [phone]   Fax:  [phone]  Occupational Therapy Treatment  Patient Details  Name: [name]. MRN: [id number] Date of Birth: [birth year] Referring Provider (OT): Dr. Shanon Brow   Encounter Date: 12/21/2020   OT End of Session - 12/21/20 0900    Visit Number 5    Number of Visits 13    Date for OT Re-Evaluation 01/07/21    Authorization Type [employer]--copay per day, VL:60 each PT/OT/ST.   (3 previous OT visits used this year)    Authorization - Visit Number 17    Authorization - Number of Visits 60    OT Start Time [phone]    OT Stop Time 0930    OT Time Calculation (min) 40 min    Activity Tolerance Patient tolerated treatment well    Behavior During Therapy WFL for tasks assessed/performed           Past Medical History:  Diagnosis Date  . Acute pancreatitis 01/22/2019  . Diabetes mellitus   . GERD (gastroesophageal reflux disease)   . History of alcohol abuse   . History of cocaine use   . History of echocardiogram    a. Echo 4/14: Moderate LVH, vigorous LVEF, EF 65-70%, normal wall motion, grade 2 diastolic dysfunction, mildly dilated aortic root and ascending aorta, ascending aorta 40 mm, aortic root 38 mm, mild LAE  . Hx of cardiovascular stress test    a. GXT 5/14: No ischemic changes  //  b. ETT-Myoview 3/16:  Low risk, no ischemia, EF 58%  . Hyperlipidemia   . Hypertension   . Morbid obesity ([facility])   . Paroxysmal atrial fibrillation ([facility])    Occurring in 2008, with several recurrence since then (including in the setting of + cocaine on UDS).  . Sleep apnea    uses CPAP  . SVT (supraventricular tachycardia) ([facility])    mid to long RP SVT 09/2019    Past Surgical History:  Procedure Laterality Date  . APPENDECTOMY    . ATRIAL FIBRILLATION ABLATION N/A 11/27/2019   Procedure: ATRIAL FIBRILLATION ABLATION;  Surgeon: [name], MD;  [facility]: [facility];  Service: Cardiovascular;  Laterality: N/A;  . ROTATOR CUFF REPAIR    . SVT ABLATION N/A 11/27/2019   Procedure: SVT ABLATION;  Surgeon: [name], MD;  Location: [facility];  Service: Cardiovascular;  Laterality: N/A;  . TONSILLECTOMY      Vitals:   12/21/20 0857  BP: (!) 145/89     Subjective Assessment - 12/21/20 0854    Subjective  pt denies pain, took medication this morning    Pertinent History Acute multifocal cerebral infarcts with R-sided weakness 11/18/20.    PMH: HTN, DM, sleep apnea, narcolepsy, hx of ETOH and cocaine abuse, CKD,  hyperkalemia, Paroxysmal Atrial fibrillation (s/p ablation in 2021), hx of CVA 06/01/20 with L-sided deficits    Limitations no heavy lifting    Patient Stated Goals improve R-sided strength    Currently in Pain? No/denies           Placing grooved pegs in pegboard with R hand for incr coordination and in-hand manipulation with min-mod difficulty and incr time.   Picking up blocks using gripper (level 2, black spring) for sustained grip strength with min-mod difficulty and incr time.  Functional reaching to place large pegs in vertical pegboard (higher level) with min difficulty with coordination at that range  and fatigue.          OT Short Term Goals - 12/16/20 0816      OT SHORT TERM GOAL #1   Title Pt will be independent with HEP for RUE coordination and strength.--check STGs 12/24/20 (extended due to seen for decr frequency initially)    Time 4    Period Weeks    Status New      OT SHORT TERM GOAL #2   Title Pt will perform bathing with supervision for transfer.    Time 4    Period Weeks    Status On-going      OT SHORT TERM GOAL #3   Title Pt will perform UB dressing mod I.    Time 4    Period Weeks    Status Achieved      OT SHORT TERM GOAL #4   Title Pt will peform LB dressing with min A.    Time 4    Period Weeks    Status Achieved   12/16/20:  supervision     OT  SHORT TERM GOAL #5   Title Pt will improve coordination for ADLs as shown by improving time on 9-hole peg test by at least 8sec with R hand.    Baseline 54.4sec    Time 4    Period Weeks    Status New             OT Long Term Goals - 12/08/20 1236      OT LONG TERM GOAL #1   Title Pt will perform simple snack prep and home maintenance tasks mod I.    Time 6    Period Weeks    Status New      OT LONG TERM GOAL #2   Title Pt will improve coordination for ADLs as shown by improving time on 9-hole peg test by at least 15sec with R hand.    Baseline 54.4sec    Time 6    Period Weeks    Status New      OT LONG TERM GOAL #3   Title Pt will improve R grip strength by at least 15lbs to assist in opening containers and lifting objects.    Baseline R-40.1, L-76lbs    Time 6    Period Weeks    Status New      OT LONG TERM GOAL #4   Title Pt will perform LB dressing mod I.    Time 6    Period Weeks    Status New      OT LONG TERM GOAL #5   Title Pt wil perform envirnonmental scanning with 90% or better accuracy    Baseline 14/16 , 87% accuracy located first pass, for basic    Time 6    Period Weeks    Status New      OT LONG TERM GOAL #6   Title ---                 Plan - 12/21/20 0900    Clinical Impression Statement Pt is progressing towards goals with improving RUE coordination and strength.    OT Occupational Profile and History Problem Focused Assessment - Including review of records relating to presenting problem    Occupational performance deficits (Please refer to evaluation for details): IADL's;ADL's;Leisure;Work    Games developer / Function / Physical Skills IADL;Coordination;FMC;Decreased knowledge of precautions;UE functional use;ADL;Strength;Dexterity;Balance;Vision;Mobility;Sensation    Cognitive Skills Safety Awareness   pt reports long hx of  learning difficulties and dyslexia   Rehab Potential Good    Clinical Decision Making Several treatment  options, min-mod task modification necessary    Comorbidities Affecting Occupational Performance: May have comorbidities impacting occupational performance    Modification or Assistance to Complete Evaluation  Min-Moderate modification of tasks or assist with assess necessary to complete eval    OT Frequency 2x / week    OT Duration 6 weeks   +eval or 13 visits over extended weeks (due to potential scheduling conflicts)   OT Treatment/Interventions Therapeutic activities;Therapeutic exercise;Cognitive remediation/compensation;Neuromuscular education;Functional Mobility Training;Visual/perceptual remediation/compensation;Patient/family education;Self-care/ADL training;Moist Heat;Fluidtherapy;DME and/or AE instruction;Cryotherapy;Passive range of motion;Manual Therapy;Paraffin    Plan continue to address RUE strength and coordination    Consulted and Agree with Plan of Care Patient           Patient will benefit from skilled therapeutic intervention in order to improve the following deficits and impairments:   Body Structure / Function / Physical Skills: IADL,Coordination,FMC,Decreased knowledge of precautions,UE functional use,ADL,Strength,Dexterity,Balance,Vision,Mobility,Sensation Cognitive Skills: Safety Awareness (pt reports long hx of learning difficulties and dyslexia)     Visit Diagnosis: Hemiplegia and hemiparesis following cerebral infarction affecting right dominant side ([facility])  Other lack of coordination  Unsteadiness on feet  Visuospatial deficit  Other symptoms and signs involving cognitive functions following cerebral infarction    Problem List Patient Active Problem List   Diagnosis Date Noted  . Cerebral thrombosis with cerebral infarction 11/20/2020  . Right sided weakness 11/19/2020  . Hyperkalemia 11/19/2020  . AKI (acute kidney injury) ([facility])   . Dysarthria   . Amnesia 09/08/2020  . 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 Acute  cerebrovascular accident (CVA) ([facility]) 06/01/2020  . Paronychia of right thumb 05/10/2020  . Mediastinal adenopathy 01/06/2020  . Secondary hypercoagulable state ([facility]) 10/22/2019  . Morbid obesity due to excess calories ([facility]) 12/08/2016  . Paroxysmal atrial fibrillation ([facility]) 01/23/2013  . Diabetes mellitus ([facility]) 01/23/2013  . Hypertension 01/23/2013  . Tobacco user 01/23/2013  . Chronic diastolic heart failure ([facility]) 01/21/2013  . 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 Shoulder pain, right 02/15/2011  . Seasonal and perennial allergic rhinitis 02/13/2008  . Dyslipidemia 02/12/2008  . Cocaine abuse ([facility]) 02/12/2008  . Obstructive sleep apnea 02/12/2008    [facility] 12/21/2020, 9:31 AM  [facility] [address] [place], [place], [address] Phone: [phone]   Fax:  [phone]  Name: [name]. MRN: [id number] Date of Birth: [birth year]   Willa Frater, OTR/L [facility] [address] [place], [place]  [address] [phone] phone [phone] 12/21/20 9:31 AM

## 2020-12-22 ENCOUNTER — Encounter: Payer: 59 | Admitting: Student

## 2020-12-22 NOTE — Therapy (Signed)
[facility] [address] [place], [name], [address] Phone: [phone]   Fax:  [phone]  Physical Therapy Treatment  Patient Details  Name: [name]. MRN: [id number] Date of Birth: 1971/05/23 Referring Provider (PT): Angelica Pou, MD   Encounter Date: 12/21/2020   PT End of Session - 12/21/20 0941    Visit Number 24    Number of Visits 32    Authorization Type UHC-60 visit limit PT/OT/speech (each visit of each type counts); as of 11/22/2020, pt has had 11 TOTAL visits in 2022    Authorization - Visit Number 12    Authorization - Number of Visits 20    PT Start Time 0935    PT Stop Time 1015    PT Time Calculation (min) 40 min    Activity Tolerance Patient tolerated treatment well    Behavior During Therapy [facility] for tasks assessed/performed           Past Medical History:  Diagnosis Date  . Acute pancreatitis 01/22/2019  . Diabetes mellitus   . GERD (gastroesophageal reflux disease)   . History of alcohol abuse   . History of cocaine use   . History of echocardiogram    a. Echo 4/14: Moderate LVH, vigorous LVEF, EF 65-70%, normal wall motion, grade 2 diastolic dysfunction, mildly dilated aortic root and ascending aorta, ascending aorta 40 mm, aortic root 38 mm, mild LAE  . Hx of cardiovascular stress test    a. GXT 5/14: No ischemic changes  //  b. ETT-Myoview 3/16:  Low risk, no ischemia, EF 58%  . Hyperlipidemia   . Hypertension   . Morbid obesity ([place])   . Paroxysmal atrial fibrillation ([place])    Occurring in 2008, with several recurrence since then (including in the setting of + cocaine on UDS).  . Sleep apnea    uses CPAP  . SVT (supraventricular tachycardia) ([place])    mid to long RP SVT 09/2019    Past Surgical History:  Procedure Laterality Date  . APPENDECTOMY    . ATRIAL FIBRILLATION ABLATION N/A 11/27/2019   Procedure: ATRIAL FIBRILLATION ABLATION;  Surgeon: Thompson Grayer,  MD;  Location: [facility];  Service: Cardiovascular;  Laterality: N/A;  . ROTATOR CUFF REPAIR    . SVT ABLATION N/A 11/27/2019   Procedure: SVT ABLATION;  Surgeon: Thompson Grayer, MD;  Location: [facility];  Service: Cardiovascular;  Laterality: N/A;  . TONSILLECTOMY      Vitals:   12/21/20 0945  BP: (!) 150/86     Subjective Assessment - 12/21/20 0937    Subjective Still have the first rollator walker they sent me and I use that for outdoor surfaces.  I'm not limping like I used to. No falls.    Pertinent History Recent hospitalization 2/4-11/21/20 (Dr. Rosalin Hawking and RN at d/c recommend OP PT)    Patient Stated Goals Pt's goals for therapy are to get back to where I was with strength and coordination.  11/22/20:  To get back where I was before this stroke.    Currently in Pain? No/denies                             [facility] Adult PT Treatment/Exercise - 12/21/20 0935      Self-Care   Self-Care Other Self-Care Comments    Other Self-Care Comments  Fall prevention education discussed and provided.  Based on pt's  improved balance measure scores, PT feels he no longer has need of RW for indoor/home use.  He has never received the rolling walker; rather he reports a rollator initially delivered from [place], which they have not picked up.  He is using this for outdoor longer distance gait, which PT agrees for pt to conitnue doing this.  Discussed pt's POC and progress to goals, plans for conitnued therapy.  Pt agreeable to continueing towards LTGs, but aware we may need to make decisions on POC, based on his visit limit.      Exercises   Exercises Other Exercises    Other Exercises  Seated edge of mat with cues for abdominal activation, and cues to breathe through this 3 sec abdominal hold, x 5 reps.  Then added abdominal activation with seated R hip flexion x 5 reps.  Discussed use of abdominal muscles to help offset back muscles trying to compensate for RLE  weakness.  Reiterated importance of continued consistency with HEP.      Knee/Hip Exercises: Standing   Knee Flexion Strengthening;AROM;Right;Left;1 set;10 reps    Hip Flexion Stengthening;Right;Left;1 set;10 reps;Knee bent    Hip Flexion Limitations Pt reports pain in his low back with repeated hip/knee flexion R side.  Explained likely compensation from low back due to weak RLE    Hip Abduction Stengthening;AROM;Right;Left;1 set;10 reps    Abduction Limitations Pt with difficulty with side hip kicks, able to tap out to side and cues to slowly return to midline.      Knee/Hip Exercises: Seated   Marching AROM;Strengthening;Right;Left;1 set;10 reps    Marching Limitations Pt reports increased pain R low back with seated march            Access Code: [email or web address] URL: [email or web address] Date: 12/21/2020 Prepared by: Mady Haagensen  Reviewed Exercises in HEP:  Pt reports he hasn't been doing them because lost his copy and needs a new copy.  Pt return demo with visual cues from [employer]. . Sit to Stand with Counter Support - 1 x daily - 5 x weekly - 1 sets - 10 reps . Seated Knee Extension with Resistance - 1 x daily - 5 x weekly - 3 sets - 10 reps o  Performed 2 sets with green band in session today, upgraded HEP to 3 sets of 10 with red band at home . Seated Hamstring Curl with Anchored Resistance - 1 x daily - 5 x weekly - 3 sets - 10 reps o  Performed 2 sets with green band in session today, upgraded HEP to 3 sets of 10 with red band at home . Heel Toe Raises with Counter Support - 1 x daily - 5 x weekly - 1 sets - 10 reps o  Upgraded to 3 sets of 10 of last 3 exercises         PT Education - 12/21/20 0943    Education Details Fall prevention education provided.  Provided printouts to HEP, as pt says he has lost his    Person(s) Educated Patient    Methods Explanation;Handout    Comprehension Verbalized understanding            PT Short Term Goals -  12/22/20 0911      PT SHORT TERM GOAL #1   Title Pt will be independent with revised HEP for improved strength, balance, gait.  TARGET 12/17/2020    Baseline 12/13/20 Pt reports doing current HEP and walking daily.    Time 4  Period Weeks    Status Achieved      PT SHORT TERM GOAL #2   Title Pt will improve TUG score to less than or equal to 18 seconds for decreased fall risk.    Baseline TUG 10.39 sec 12/13/20    Time 4    Period Weeks    Status Achieved      PT SHORT TERM GOAL #3   Title Pt will improve Berg Balance score to at least 19/56 for decreased fall risk.    Baseline 46/56 12/13/20    Time 4    Period Weeks    Status Achieved      PT SHORT TERM GOAL #4   Title Pt will improve 5x sit<>stand to less than or equal to 18 seconds to demonstrate improved functional strength.    Baseline 14.45  12/13/20    Time 4    Status Achieved      PT SHORT TERM GOAL #5   Title Pt will verbalize understanding of fall prevention in home environment.    Baseline 12/21/20 met    Time 4    Period Weeks    Status Achieved   unable due to time constraints            PT Long Term Goals - 12/22/20 0912      PT LONG TERM GOAL #1   Title Pt will be independent with final progression of HEP for improved gait and balance.  TARGET 01/14/2021    Time 8    Period Weeks    Status On-going      PT LONG TERM GOAL #2   Title Pt will improve TUG score to less than or equal to 13.5 sec for decreased fall risk.    Baseline met per STG check 10.39 sec    Time 8    Period Weeks    Status Achieved      PT LONG TERM GOAL #3   Title Pt will improve Berg Balance score to at least 52/56 for decreased fall risk.    Baseline 46/56 at STG check    Time 8    Period Weeks    Status Revised      PT LONG TERM GOAL #4   Title Pt will improve 5x sit<>stand to less than or equal to 13 sec for improved functional lower extremity strength.    Baseline 14.25 STG check    Time 8    Period Weeks    Status  On-going      PT LONG TERM GOAL #5   Title Pt will improve gait velocity to at least 2 ft/sec for improved gait efficiency and decreased fall risk.    Baseline 0.96 ft/sec 11/22/20    Time 8    Period Weeks    Status On-going      PT LONG TERM GOAL #6   Title Pt will ambulate at least 1000 ft, indoors and outdoors surfaces, using least restrictive assistive device, mod independently, no LOB, for improved community ambulation.    Time 8    Period Weeks    Status New                 Plan - 12/22/20 8850    Clinical Impression Statement Pt's vitals WNL this visit.  Focused on education on fall preveniton, importance of consistent performance of HEP in addition to walking.  Tried to progress to adding standing exercises at counter, but pt limited with back pain (likely  compensations with posture due to RLE weakness).  Made adjustments in session (side step taps, cues for abdominal activation), and increased sets to 3 sets of 10 for HEP.  Pt will need continued work for RLE strengthening to improve strength and balance.  Updated LTGs based on pt's progress with STGs.    Personal Factors and Comorbidities Comorbidity 3+    Comorbidities diabetes, HTN, morbid obesity, history of polysubstance abuse (cocaine and alcohol)    Examination-Activity Limitations Locomotion Level;Transfers;Stairs    Examination-Participation Restrictions Community Activity;Yard Work    Stability/Clinical Decision Making Evolving/Moderate complexity    Rehab Potential Good    PT Frequency 2x / week    PT Duration 8 weeks   including week of recert, 03/23/3418   PT Treatment/Interventions ADLs/Self Care Home Management;Gait training;Stair training;Functional mobility training;Therapeutic activities;Therapeutic exercise;Balance training;Neuromuscular re-education;Patient/family education;DME Instruction    PT Next Visit Plan Curb negotiation and truck transfers with high steps. Update HEP (try to add standing exercises  at counter, try step ups?) and walking program .  Pt reports having rollator at home for long distances; at this point, train on outdoor surfaces with rollator.   Please check vitals  (we will need to monitor visit number for him, due to VL with insurance, now that he is back to OT and speech). continue with trials of braces for right LE.    PT Home Exercise Plan Access Code: [email or web address]    Consulted and Agree with Plan of Care Patient           Patient will benefit from skilled therapeutic intervention in order to improve the following deficits and impairments:  Abnormal gait,Difficulty walking,Decreased balance,Decreased mobility  Visit Diagnosis: Muscle weakness (generalized)  Other abnormalities of gait and mobility  Unsteadiness on feet     Problem List Patient Active Problem List   Diagnosis Date Noted  . Cerebral thrombosis with cerebral infarction 11/20/2020  . Right sided weakness 11/19/2020  . Hyperkalemia 11/19/2020  . AKI (acute kidney injury) ([place])   . Dysarthria   . Amnesia 09/08/2020  . 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 Acute cerebrovascular accident (CVA) ([place]) 06/01/2020  . Paronychia of right thumb 05/10/2020  . Mediastinal adenopathy 01/06/2020  . Secondary hypercoagulable state ([place]) 10/22/2019  . Morbid obesity due to excess calories ([place]) 12/08/2016  . Paroxysmal atrial fibrillation ([place]) 01/23/2013  . Diabetes mellitus ([place]) 01/23/2013  . Hypertension 01/23/2013  . Tobacco user 01/23/2013  . Chronic diastolic heart failure ([place]) 01/21/2013  . 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 Shoulder pain, right 02/15/2011  . Seasonal and perennial allergic rhinitis 02/13/2008  . Dyslipidemia 02/12/2008  . Cocaine abuse ([place]) 02/12/2008  . Obstructive sleep apnea 02/12/2008    Frazier Butt. 12/22/2020, 9:22 AM  Frazier Butt., PT   [facility] [address] [place], [name], [address] Phone: [phone]   Fax:  [phone]  Name: [name]. MRN: [id number] Date of Birth: June 12, 1971

## 2020-12-23 ENCOUNTER — Ambulatory Visit: Payer: 59 | Admitting: Occupational Therapy

## 2020-12-23 ENCOUNTER — Encounter: Payer: 59 | Admitting: Student

## 2020-12-23 ENCOUNTER — Ambulatory Visit: Payer: 59

## 2020-12-23 ENCOUNTER — Telehealth: Payer: Self-pay | Admitting: Adult Health

## 2020-12-23 ENCOUNTER — Other Ambulatory Visit (HOSPITAL_COMMUNITY): Payer: Self-pay

## 2020-12-23 ENCOUNTER — Ambulatory Visit: Payer: 59 | Admitting: Physical Therapy

## 2020-12-23 DIAGNOSIS — R1310 Dysphagia, unspecified: Secondary | ICD-10-CM

## 2020-12-23 NOTE — Telephone Encounter (Signed)
-----   Message from Ihor Austin, NP sent at 12/22/2020  2:38 PM EST ----- [name], can you please look into this referral?  It was placed on 3/1 but patient has not been contacted to schedule. Thank you!

## 2020-12-23 NOTE — Telephone Encounter (Signed)
Awesome. Thank you Annabelle Harman for looking into this and assisting!  Greatly appreciated!

## 2020-12-23 NOTE — Telephone Encounter (Signed)
Andreas Blower I sent order on 12/14/2020 Dahlia Client called patient on 12/16/2020 left him a message to call back to schedule. I will call patient and make sure he touches base with Dahlia Client for apt Hannah swallow study [phone] . Thanks Annabelle Harman

## 2020-12-23 NOTE — Telephone Encounter (Signed)
Spoke to patient and he has the number he will call Dahlia Client [phone] .

## 2020-12-27 ENCOUNTER — Ambulatory Visit: Payer: 59 | Admitting: Physical Therapy

## 2020-12-27 ENCOUNTER — Ambulatory Visit: Payer: 59

## 2020-12-27 ENCOUNTER — Ambulatory Visit: Payer: 59 | Admitting: Occupational Therapy

## 2020-12-28 ENCOUNTER — Ambulatory Visit (HOSPITAL_COMMUNITY)
Admission: RE | Admit: 2020-12-28 | Discharge: 2020-12-28 | Disposition: A | Discharge status: Home / Self Care | Payer: 59 | Source: Ambulatory Visit | Attending: Adult Health | Admitting: Adult Health

## 2020-12-28 ENCOUNTER — Other Ambulatory Visit: Payer: Self-pay

## 2020-12-28 DIAGNOSIS — R1310 Dysphagia, unspecified: Secondary | ICD-10-CM | POA: Insufficient documentation

## 2020-12-28 DIAGNOSIS — I69391 Dysphagia following cerebral infarction: Secondary | ICD-10-CM | POA: Insufficient documentation

## 2020-12-28 DIAGNOSIS — K219 Gastro-esophageal reflux disease without esophagitis: Secondary | ICD-10-CM | POA: Diagnosis not present

## 2020-12-29 ENCOUNTER — Encounter: Payer: Self-pay | Admitting: Occupational Therapy

## 2020-12-29 ENCOUNTER — Ambulatory Visit (INDEPENDENT_AMBULATORY_CARE_PROVIDER_SITE_OTHER): Payer: 59 | Admitting: Student

## 2020-12-29 ENCOUNTER — Ambulatory Visit: Payer: 59

## 2020-12-29 ENCOUNTER — Ambulatory Visit: Payer: 59 | Admitting: Occupational Therapy

## 2020-12-29 ENCOUNTER — Encounter: Payer: Self-pay | Admitting: Student

## 2020-12-29 ENCOUNTER — Other Ambulatory Visit: Payer: Self-pay | Admitting: Internal Medicine

## 2020-12-29 ENCOUNTER — Telehealth: Payer: Self-pay

## 2020-12-29 ENCOUNTER — Other Ambulatory Visit: Payer: Self-pay

## 2020-12-29 VITALS — BP 151/88 | HR 80 | Temp 97.7°F | Ht 69.0 in | Wt 315.6 lb

## 2020-12-29 VITALS — BP 148/96

## 2020-12-29 DIAGNOSIS — R278 Other lack of coordination: Secondary | ICD-10-CM

## 2020-12-29 DIAGNOSIS — K219 Gastro-esophageal reflux disease without esophagitis: Secondary | ICD-10-CM

## 2020-12-29 DIAGNOSIS — E785 Hyperlipidemia, unspecified: Secondary | ICD-10-CM

## 2020-12-29 DIAGNOSIS — R42 Dizziness and giddiness: Secondary | ICD-10-CM

## 2020-12-29 DIAGNOSIS — R41842 Visuospatial deficit: Secondary | ICD-10-CM

## 2020-12-29 DIAGNOSIS — I10 Essential (primary) hypertension: Secondary | ICD-10-CM | POA: Diagnosis not present

## 2020-12-29 DIAGNOSIS — E875 Hyperkalemia: Secondary | ICD-10-CM

## 2020-12-29 DIAGNOSIS — I480 Paroxysmal atrial fibrillation: Secondary | ICD-10-CM

## 2020-12-29 DIAGNOSIS — M6281 Muscle weakness (generalized): Secondary | ICD-10-CM

## 2020-12-29 DIAGNOSIS — R2689 Other abnormalities of gait and mobility: Secondary | ICD-10-CM

## 2020-12-29 DIAGNOSIS — E1165 Type 2 diabetes mellitus with hyperglycemia: Secondary | ICD-10-CM

## 2020-12-29 DIAGNOSIS — I69351 Hemiplegia and hemiparesis following cerebral infarction affecting right dominant side: Secondary | ICD-10-CM

## 2020-12-29 MED ORDER — BLOOD GLUCOSE MONITOR KIT
PACK | 0 refills | Status: DC
Start: 2020-12-29 — End: 2023-02-02

## 2020-12-29 MED ORDER — APIXABAN 5 MG PO TABS: 5.0000 mg | ORAL_TABLET | Freq: Two times a day (BID) | ORAL | 0 refills | Status: DC

## 2020-12-29 MED ORDER — ATORVASTATIN CALCIUM 80 MG PO TABS: 80.0000 mg | ORAL_TABLET | Freq: Every day | ORAL | 0 refills | Status: DC

## 2020-12-29 MED ORDER — PANTOPRAZOLE SODIUM 40 MG PO TBEC: 40.0000 mg | DELAYED_RELEASE_TABLET | Freq: Every day | ORAL | 5 refills | Status: DC

## 2020-12-29 MED ORDER — OLMESARTAN-AMLODIPINE-HCTZ 40-5-25 MG PO TABS: 1.0000 | ORAL_TABLET | Freq: Every day | ORAL | 0 refills | Status: DC

## 2020-12-29 NOTE — Progress Notes (Signed)
   CC: hypertension, follow-up  HPI:  [name] is a 50 y.o. with medical history as listed below presenting to [facility] for follow-up appointment from last month.  Please see problem-based list for further details, assessments, and plans.  Past Medical History:  Diagnosis Date  . Acute pancreatitis 01/22/2019  . Diabetes mellitus   . GERD (gastroesophageal reflux disease)   . History of alcohol abuse   . History of cocaine use   . History of echocardiogram    a. Echo 4/14: Moderate LVH, vigorous LVEF, EF 65-70%, normal wall motion, grade 2 diastolic dysfunction, mildly dilated aortic root and ascending aorta, ascending aorta 40 mm, aortic root 38 mm, mild LAE  . Hx of cardiovascular stress test    a. GXT 5/14: No ischemic changes  //  b. ETT-Myoview 3/16:  Low risk, no ischemia, EF 58%  . Hyperlipidemia   . Hypertension   . Morbid obesity ([facility])   . Paroxysmal atrial fibrillation ([facility])    Occurring in 2008, with several recurrence since then (including in the setting of + cocaine on UDS).  . Sleep apnea    uses CPAP  . SVT (supraventricular tachycardia) ([facility])    mid to long RP SVT 09/2019   Review of Systems:  As per HPI  Physical Exam:  Vitals:   12/29/20 1607  BP: (!) 151/88  Pulse: 80  Temp: 97.7 F (36.5 C)  TempSrc: Oral  SpO2: 99%  Weight: (!) 315 lb 9.6 oz (143.2 kg)  Height: 5\' 9"  (1.753 m)   General: Pleasant, sitting in chair in no acute distress CV: Regular rate, rhythm. No murmurs, rubs, gallops. Pulm: Normal work of breathing, no use accessory muscles. Clear to auscultation bilaterally. Neuro: Awake, alert, oriented x4. CN in tact. Motor 5/5 throughout. Sensation in tact throughout.  Assessment & Plan:   See Encounters Tab for problem based charting.  Patient discussed with Dr. 

## 2020-12-29 NOTE — Therapy (Signed)
[facility] [address] [place], [name], [address] Phone: [phone]   Fax:  [phone]  Occupational Therapy Treatment  Patient Details  Name: [name]. MRN: [id number] Date of Birth: 20-May-1971 Referring Provider (OT): Dr. Gerarda Gunther   Encounter Date: 12/29/2020   OT End of Session - 12/29/20 0956    Visit Number 6    Number of Visits 13    Date for OT Re-Evaluation 01/07/21    Authorization Type UHC--copay per day, VL:60 each PT/OT/ST.   (3 previous OT visits used this year)    Authorization - Visit Number 18    Authorization - Number of Visits 13    OT Start Time [phone]    OT Stop Time 1015    OT Time Calculation (min) 38 min    Activity Tolerance Patient tolerated treatment well    Behavior During Therapy [facility] for tasks assessed/performed           Past Medical History:  Diagnosis Date  . Acute pancreatitis 01/22/2019  . Diabetes mellitus   . GERD (gastroesophageal reflux disease)   . History of alcohol abuse   . History of cocaine use   . History of echocardiogram    a. Echo 4/14: Moderate LVH, vigorous LVEF, EF 65-70%, normal wall motion, grade 2 diastolic dysfunction, mildly dilated aortic root and ascending aorta, ascending aorta 40 mm, aortic root 38 mm, mild LAE  . Hx of cardiovascular stress test    a. GXT 5/14: No ischemic changes  //  b. ETT-Myoview 3/16:  Low risk, no ischemia, EF 58%  . Hyperlipidemia   . Hypertension   . Morbid obesity ([place])   . Paroxysmal atrial fibrillation ([place])    Occurring in 2008, with several recurrence since then (including in the setting of + cocaine on UDS).  . Sleep apnea    uses CPAP  . SVT (supraventricular tachycardia) ([place])    mid to long RP SVT 09/2019    Past Surgical History:  Procedure Laterality Date  . APPENDECTOMY    . ATRIAL FIBRILLATION ABLATION N/A 11/27/2019   Procedure: ATRIAL FIBRILLATION ABLATION;  Surgeon: Thompson Grayer, MD;  Location: [facility];  Service: Cardiovascular;  Laterality: N/A;  . ROTATOR CUFF REPAIR    . SVT ABLATION N/A 11/27/2019   Procedure: SVT ABLATION;  Surgeon: Thompson Grayer, MD;  Location: [facility];  Service: Cardiovascular;  Laterality: N/A;  . TONSILLECTOMY      There were no vitals filed for this visit.   Subjective Assessment - 12/29/20 0939    Subjective  Pt has run out of Eliquis    Pertinent History Acute multifocal cerebral infarcts with R-sided weakness 11/18/20.    PMH: HTN, DM, sleep apnea, narcolepsy, hx of ETOH and cocaine abuse, CKD,  hyperkalemia, Paroxysmal Atrial fibrillation (s/p ablation in 2021), hx of CVA 06/01/20 with L-sided deficits    Currently in Pain? No/denies                   Treatment: BP was elevated in PT today. Pt sees MD today and plans to discuss with him. Therapist discussed with p importance of a healthier diet and limiting salt intake as it can affect BP.(Pt reports eating sausage for breakfast). Pt reports running out of eliquuis. Therpaist discussed with pt importance of getting this medication refilled to minimize CVA risk. Pt reports got a summons for jury duty. Therapist recommends pt discusses with  MD and to make sure he calls the court house as he would likely be eligible to defer this due to CVA.  Placing and removing pegs, washers from [employer] pegboard with RUE, min-mod difficulty for increased fine motor coordination/ in hand manipulation,  increased time required. Pt demonstrates difficulty with in hand manipulation. Gripper set at level 3 with silver spring for sustained grip min difficulty and drops  Graded clothespins for sustained pinch, and functional reach with RUE, min difficulty  Checked 9 hole peg test, pt met STG.              OT Short Term Goals - 12/29/20 1016      OT SHORT TERM GOAL #1   Title Pt will be independent with HEP for RUE coordination and strength.--check STGs 12/24/20  (extended due to seen for decr frequency initially)    Time 4    Period Weeks    Status New      OT SHORT TERM GOAL #2   Title Pt will perform bathing with supervision for transfer.    Time 4    Period Weeks    Status On-going   min A     OT SHORT TERM GOAL #3   Title Pt will perform UB dressing mod I.    Time 4    Period Weeks    Status Achieved      OT SHORT TERM GOAL #4   Title Pt will peform LB dressing with min A.    Time 4    Period Weeks    Status Achieved   12/16/20:  supervision     OT SHORT TERM GOAL #5   Title Pt will improve coordination for ADLs as shown by improving time on 9-hole peg test by at least 8sec with R hand.    Baseline 54.4sec    Time 4    Period Weeks    Status Achieved   12/29/20-42.22 secs            OT Long Term Goals - 12/08/20 1236      OT LONG TERM GOAL #1   Title Pt will perform simple snack prep and home maintenance tasks mod I.    Time 6    Period Weeks    Status New      OT LONG TERM GOAL #2   Title Pt will improve coordination for ADLs as shown by improving time on 9-hole peg test by at least 15sec with R hand.    Baseline 54.4sec    Time 6    Period Weeks    Status New      OT LONG TERM GOAL #3   Title Pt will improve R grip strength by at least 15lbs to assist in opening containers and lifting objects.    Baseline R-40.1, L-76lbs    Time 6    Period Weeks    Status New      OT LONG TERM GOAL #4   Title Pt will perform LB dressing mod I.    Time 6    Period Weeks    Status New      OT LONG TERM GOAL #5   Title Pt wil perform envirnonmental scanning with 90% or better accuracy    Baseline 14/16 , 87% accuracy located first pass, for basic    Time 6    Period Weeks    Status New      OT LONG TERM GOAL #6   Title ---  Plan - 12/29/20 0954    Clinical Impression Statement Pt is progressing towards goals with improving RUE coordination and functional use.    OT Occupational Profile and  History Problem Focused Assessment - Including review of records relating to presenting problem    Occupational performance deficits (Please refer to evaluation for details): IADL's;ADL's;Leisure;Work    Marketing executive / Function / Physical Skills IADL;Coordination;FMC;Decreased knowledge of precautions;UE functional use;ADL;Strength;Dexterity;Balance;Vision;Mobility;Sensation    Cognitive Skills Safety Awareness   pt reports long hx of learning difficulties and dyslexia   Rehab Potential Good    Clinical Decision Making Several treatment options, min-mod task modification necessary    Comorbidities Affecting Occupational Performance: May have comorbidities impacting occupational performance    Modification or Assistance to Complete Evaluation  Min-Moderate modification of tasks or assist with assess necessary to complete eval    OT Frequency 2x / week    OT Duration 6 weeks   +eval or 13 visits over extended weeks (due to potential scheduling conflicts)   OT Treatment/Interventions Therapeutic activities;Therapeutic exercise;Cognitive remediation/compensation;Neuromuscular education;Functional Mobility Training;Visual/perceptual remediation/compensation;Patient/family education;Self-care/ADL training;Moist Heat;Fluidtherapy;DME and/or AE instruction;Cryotherapy;Passive range of motion;Manual Therapy;Paraffin    Plan work towards unmet goals    Consulted and Agree with Plan of Care Patient           Patient will benefit from skilled therapeutic intervention in order to improve the following deficits and impairments:   Body Structure / Function / Physical Skills: IADL,Coordination,FMC,Decreased knowledge of precautions,UE functional use,ADL,Strength,Dexterity,Balance,Vision,Mobility,Sensation Cognitive Skills: Safety Awareness (pt reports long hx of learning difficulties and dyslexia)     Visit Diagnosis: Muscle weakness (generalized)  Other lack of coordination  Visuospatial  deficit  Hemiplegia and hemiparesis following cerebral infarction affecting right dominant side [facility])    Problem List Patient Active Problem List   Diagnosis Date Noted  . Cerebral thrombosis with cerebral infarction 11/20/2020  . Right sided weakness 11/19/2020  . Hyperkalemia 11/19/2020  . AKI (acute kidney injury) ([place])   . Dysarthria   . Amnesia 09/08/2020  . 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 Acute cerebrovascular accident (CVA) ([place]) 06/01/2020  . Paronychia of right thumb 05/10/2020  . Mediastinal adenopathy 01/06/2020  . Secondary hypercoagulable state ([place]) 10/22/2019  . Morbid obesity due to excess calories ([place]) 12/08/2016  . Paroxysmal atrial fibrillation ([place]) 01/23/2013  . Diabetes mellitus ([place]) 01/23/2013  . Hypertension 01/23/2013  . Tobacco user 01/23/2013  . Chronic diastolic heart failure ([place]) 01/21/2013  . 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 Shoulder pain, right 02/15/2011  . Seasonal and perennial allergic rhinitis 02/13/2008  . Dyslipidemia 02/12/2008  . Cocaine abuse ([place]) 02/12/2008  . Obstructive sleep apnea 02/12/2008    Iran Rowe 12/29/2020, 10:32 AM  [facility] [address] [place], [name], [address] Phone: [phone]   Fax:  [phone]  Name: [name]. MRN: [id number] Date of Birth: June 10, 1971

## 2020-12-29 NOTE — Patient Instructions (Addendum)
Mr. Negron  It was a pleasure seeing you today! Here is your list of medications:  These medications will NOT change: Apixaban (Eliquis), 5mg  - Take one tablet in the AM, one tablet in the PM (This is a blood thinner)  Metformin, 500mg  - Take TWO tablets in the AM and TWO tablets in the PM  Glipizide 10mg  - Take one tablet in the AM, one tablet in the PM (This is for your diabetes)  Felcanide 10mg  - Take one tablet in the AM (This is for your heart rhythm)  Atorvastatin 80mg  - Take one tablet in the AM (This is for your cholesterol)  Metoprolol 50mg  - Take one tablet in the AM (This is for your blood pressure and heart)  Pantoprazole 40mg  - Take one tablet in the AM (This is for your acid reflux)  STOP taking the following medications: Amlodipine Lisinopril Spironolactone Hydrochlorothiazide Kayexalate (the powder)  START taking the following medications: Olmesartan-amlodipine-hydrochlorothiazide - Take ONE tablet in the AM  I have sent this new medication and your re-fills to the pharmacy. I have also ordered a new glucose meter.  We look forward to seeing you next time. Please call our clinic at [phone] if you have any questions or concerns. The best time to call is Monday-Friday from 9am-4pm, but there is someone available 24/7 at the same number. If you need medication refills, please notify your pharmacy one week in advance and they will send a request.  Thank you for letting take part in your care. Wishing you the best!  Thank you, Dr. , MD

## 2020-12-29 NOTE — Telephone Encounter (Signed)
Dr. Marijo Conception, I saw [name] today for PT. I know he is scheduled to see you tomorrow for a follow-up. I wanted to send you some information on his vitals and med issues from his recent therapy visits. His BP has been elevated and I've included those readings below. He had a lot of questions about how to properly take his meds today as far as timing and such. He says he does use a med box. He seems to be running out sooner than he should per his report. He told me he ran out of his eliquis this week. Could you perhaps go out his med schedule with him? Thanks so much for your help!  12/10/20 156/100 2/28 140/88 3/3 186/106 3/8 145/89  3/16 148/96  Elmer Bales, PT, DPT, NCS

## 2020-12-29 NOTE — Therapy (Signed)
[facility] [address] [place], [place], [address] Phone: [phone]   Fax:  [phone]  Physical Therapy Treatment  Patient Details  Name: [name]. MRN: [id number] Date of Birth: 11/20/1970 Referring Provider (PT): Miguel Aschoff, MD   Encounter Date: 12/29/2020   PT End of Session - 12/29/20 0857    Visit Number 25    Number of Visits 32    Authorization Type [employer]-60 visit limit PT/OT/speech (each visit of each type counts); as of 11/22/2020, pt has had 11 TOTAL visits in 2022    Authorization - Visit Number 13    Authorization - Number of Visits 20    PT Start Time [phone]   pt arrived late due to transportation   PT Stop Time 0929    PT Time Calculation (min) 34 min    Equipment Utilized During Treatment --    Activity Tolerance Patient tolerated treatment well    Behavior During Therapy [facility] for tasks assessed/performed           Past Medical History:  Diagnosis Date  . Acute pancreatitis 01/22/2019  . Diabetes mellitus   . GERD (gastroesophageal reflux disease)   . History of alcohol abuse   . History of cocaine use   . History of echocardiogram    a. Echo 4/14: Moderate LVH, vigorous LVEF, EF 65-70%, normal wall motion, grade 2 diastolic dysfunction, mildly dilated aortic root and ascending aorta, ascending aorta 40 mm, aortic root 38 mm, mild LAE  . Hx of cardiovascular stress test    a. GXT 5/14: No ischemic changes  //  b. ETT-Myoview 3/16:  Low risk, no ischemia, EF 58%  . Hyperlipidemia   . Hypertension   . Morbid obesity (HCC)   . Paroxysmal atrial fibrillation (HCC)    Occurring in 2008, with several recurrence since then (including in the setting of + cocaine on UDS).  . Sleep apnea    uses CPAP  . SVT (supraventricular tachycardia) (HCC)    mid to long RP SVT 09/2019    Past Surgical History:  Procedure Laterality Date  . APPENDECTOMY    . ATRIAL FIBRILLATION ABLATION  N/A 11/27/2019   Procedure: ATRIAL FIBRILLATION ABLATION;  Surgeon: [name], MD;  Location: [facility] INVASIVE CV LAB;  Service: Cardiovascular;  Laterality: N/A;  . ROTATOR CUFF REPAIR    . SVT ABLATION N/A 11/27/2019   Procedure: SVT ABLATION;  Surgeon: [name], MD;  Location: [facility] INVASIVE CV LAB;  Service: Cardiovascular;  Laterality: N/A;  . TONSILLECTOMY      Vitals:   12/29/20 0858  BP: (!) 148/96     Subjective Assessment - 12/29/20 0857    Subjective Pt reports he is doing ok. He does have questions about his medications and reports he ran out of eliquis this week. Goes to PCP tomorrow.    Pertinent History Recent hospitalization 2/4-11/21/20 (Dr. Marvel Plan and RN at d/c recommend OP PT)    Patient Stated Goals Pt's goals for therapy are to get back to where I was with strength and coordination.  11/22/20:  To get back where I was before this stroke.    Currently in Pain? No/denies                             [facility] Adult PT Treatment/Exercise - 12/29/20 0904      Ambulation/Gait   Ambulation/Gait Yes    Ambulation/Gait  Assistance 5: Supervision    Ambulation/Gait Assistance Details ambulated in/out of cinic without AD    Assistive device None    Gait Pattern Step-through pattern;Decreased hip/knee flexion - right    Ambulation Surface Level;Indoor      Neuro Re-ed    Neuro Re-ed Details  At counter: side stepping with light fingertip support with verbal cues to keep right foot straight and not slide but pick up 8' x 6. Marching gait with 1 UE support forwards then backwards walk with cues to take bigger steps 8' x 3 each. Step-ups on 4" step with RLE x 10. 158/100 after activities. Rested a couple minutes and rechecked and was 152/98.      Exercises   Exercises Other Exercises    Other Exercises  Standing at counter stepping out to right and back x 10 with cues to pick foot up with return.                  PT Education - 12/29/20 [phone]     Education Details PT discussed with pt about plan to send message to his PCP about recent elevated BP reading in therapy. Will also let him know about pt being out of eliquis. Advised pt to discuss meds with PCP as he has a lot of questions about how he should be taking them and when.    Person(s) Educated Patient    Methods Explanation    Comprehension Verbalized understanding            PT Short Term Goals - 12/22/20 0911      PT SHORT TERM GOAL #1   Title Pt will be independent with revised HEP for improved strength, balance, gait.  TARGET 12/17/2020    Baseline 12/13/20 Pt reports doing current HEP and walking daily.    Time 4    Period Weeks    Status Achieved      PT SHORT TERM GOAL #2   Title Pt will improve TUG score to less than or equal to 18 seconds for decreased fall risk.    Baseline TUG 10.39 sec 12/13/20    Time 4    Period Weeks    Status Achieved      PT SHORT TERM GOAL #3   Title Pt will improve Berg Balance score to at least 19/56 for decreased fall risk.    Baseline 46/56 12/13/20    Time 4    Period Weeks    Status Achieved      PT SHORT TERM GOAL #4   Title Pt will improve 5x sit<>stand to less than or equal to 18 seconds to demonstrate improved functional strength.    Baseline 14.45  12/13/20    Time 4    Status Achieved      PT SHORT TERM GOAL #5   Title Pt will verbalize understanding of fall prevention in home environment.    Baseline 12/21/20 met    Time 4    Period Weeks    Status Achieved   unable due to time constraints            PT Long Term Goals - 12/22/20 0912      PT LONG TERM GOAL #1   Title Pt will be independent with final progression of HEP for improved gait and balance.  TARGET 01/14/2021    Time 8    Period Weeks    Status On-going      PT LONG TERM GOAL #2  Title Pt will improve TUG score to less than or equal to 13.5 sec for decreased fall risk.    Baseline met per STG check 10.39 sec    Time 8    Period Weeks    Status  Achieved      PT LONG TERM GOAL #3   Title Pt will improve Berg Balance score to at least 52/56 for decreased fall risk.    Baseline 46/56 at STG check    Time 8    Period Weeks    Status Revised      PT LONG TERM GOAL #4   Title Pt will improve 5x sit<>stand to less than or equal to 13 sec for improved functional lower extremity strength.    Baseline 14.25 STG check    Time 8    Period Weeks    Status On-going      PT LONG TERM GOAL #5   Title Pt will improve gait velocity to at least 2 ft/sec for improved gait efficiency and decreased fall risk.    Baseline 0.96 ft/sec 11/22/20    Time 8    Period Weeks    Status On-going      PT LONG TERM GOAL #6   Title Pt will ambulate at least 1000 ft, indoors and outdoors surfaces, using least restrictive assistive device, mod independently, no LOB, for improved community ambulation.    Time 8    Period Weeks    Status New                 Plan - 12/29/20 0959    Clinical Impression Statement Pt had not pain with standing exercises today and PT was able to add to HEP. Did well with step-ups with RLE. Pt did however, have elevated BP so treatment was limited. Pt going to PCP tomorrow and PT will send note to Dr. Collene Gobble about ongoing elevated BPs.    Personal Factors and Comorbidities Comorbidity 3+    Comorbidities diabetes, HTN, morbid obesity, history of polysubstance abuse (cocaine and alcohol)    Examination-Activity Limitations Locomotion Level;Transfers;Stairs    Examination-Participation Restrictions Community Activity;Yard Work    Stability/Clinical Decision Making Evolving/Moderate complexity    Rehab Potential Good    PT Frequency 2x / week    PT Duration 8 weeks   including week of recert, 12/16/4399   PT Treatment/Interventions ADLs/Self Care Home Management;Gait training;Stair training;Functional mobility training;Therapeutic activities;Therapeutic exercise;Balance training;Neuromuscular re-education;Patient/family  education;DME Instruction    PT Next Visit Plan How did visit with PCP go? Curb negotiation and truck transfers with high steps.   Pt reports having rollator at home for long distances; at this point, train on outdoor surfaces with rollator.   Please check vitals  (we will need to monitor visit number for him, due to VL with insurance, now that he is back to OT and speech). continue with trials of braces for right LE.    PT Home Exercise Plan Access Code: [email or web address]    Consulted and Agree with Plan of Care Patient           Patient will benefit from skilled therapeutic intervention in order to improve the following deficits and impairments:  Abnormal gait,Difficulty walking,Decreased balance,Decreased mobility  Visit Diagnosis: Other abnormalities of gait and mobility  Muscle weakness (generalized)     Problem List Patient Active Problem List   Diagnosis Date Noted  . Cerebral thrombosis with cerebral infarction 11/20/2020  . Right sided weakness 11/19/2020  . Hyperkalemia 11/19/2020  .  AKI (acute kidney injury) ([place])   . Dysarthria   . Amnesia 09/08/2020  . 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 Acute cerebrovascular accident (CVA) ([place]) 06/01/2020  . Paronychia of right thumb 05/10/2020  . Mediastinal adenopathy 01/06/2020  . Secondary hypercoagulable state ([place]) 10/22/2019  . Morbid obesity due to excess calories ([place]) 12/08/2016  . Paroxysmal atrial fibrillation ([place]) 01/23/2013  . Diabetes mellitus ([place]) 01/23/2013  . Hypertension 01/23/2013  . Tobacco user 01/23/2013  . Chronic diastolic heart failure ([place]) 01/21/2013  . 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 Shoulder pain, right 02/15/2011  . Seasonal and perennial allergic rhinitis 02/13/2008  . Dyslipidemia 02/12/2008  . Cocaine abuse ([place]) 02/12/2008  . Obstructive sleep apnea 02/12/2008    Electa Sniff, PT, DPT, NCS 12/29/2020, 10:02 AM  [facility] [address] [place], [name], [address] Phone: [phone]   Fax:  [phone]  Name: [name]. MRN: [id number] Date of Birth: 08/21/1971

## 2020-12-29 NOTE — Telephone Encounter (Signed)
[name], Thank you so much! This is very helpful. I will definitely go over this with him.  Thanks! Evlyn Kanner

## 2020-12-29 NOTE — Patient Instructions (Signed)
Access Code: [email or web address] URL: [email or web address] Date: 12/29/2020 Prepared by: Elmer Bales  Exercises Sit to Stand with Counter Support - 1 x daily - 5 x weekly - 1 sets - 10 reps Seated Knee Extension with Resistance - 1 x daily - 5 x weekly - 3 sets - 10 reps Seated Hamstring Curl with Anchored Resistance - 1 x daily - 5 x weekly - 3 sets - 10 reps Heel Toe Raises with Counter Support - 1 x daily - 5 x weekly - 3 sets - 10 reps Side Stepping with Counter Support - 2 x daily - 5 x weekly - 1 sets - 3-4 reps Walking March - 2 x daily - 5 x weekly - 1 sets - 3-4 reps Backward Walking with Counter Support - 2 x daily - 5 x weekly - 1 sets - 3-4 reps

## 2020-12-30 DIAGNOSIS — R42 Dizziness and giddiness: Secondary | ICD-10-CM | POA: Insufficient documentation

## 2020-12-30 NOTE — Assessment & Plan Note (Addendum)
BP Readings from Last 3 Encounters:  12/29/20 (!) 151/88  12/29/20 (!) 148/96  12/21/20 (!) 150/86   Mr. Hightower presenting for follow-up appointment. Today he does mention some dizziness, but denies any chest pain, dyspnea, orthopnea, PND, leg swelling. When asked, patient does not know the medications he is taking. Mr. Kirby did bring his medication bottles to his appointment. It appears he is still taking lisinopril and spironolactone despite previously being stopped.  Had long discussion with Mr. Prokop regarding the importance of keeping up with his medications. He states he does have a pill box, although he is unclear which ones are in it. During his visit, we went through each of his bottles. Using a Sharpie, I marked the bottles of medications he should no longer take, including lisinopril, spironolactone, amlodipine, and hydrochlorothiazide. Instead, will prescribe combination pill to simplify regimen. Stressed importance of NOT taking the previous four medications, as this could make him more dizzy. Suggested that he does not take the pills that are currently in his pill box since he is unclear which ones they are. Will have him return in one week to re-check blood pressure and go over his medications again. Moving forward, it will be imperative to go over his medications at every visit to ensure he understands which medications to take, and when. Suggest discussing pharmacy visit with Dr. Nicholaus Bloom at next visit for further assistance. - Stop taking amlodipine, HCTZ, lisinopril, spironolactone - Start olmesartan-amlodipine-HCTZ 40-10-25mg  daily - Follow-up in one week - Can consider consult with Dr. Nicholaus Bloom for polypharmacy

## 2020-12-30 NOTE — Assessment & Plan Note (Signed)
Patient states that he is feeling dizzy today. Mentions that he last ate a couple hours ago, which included fries and steak patty. Specifically states he feels a bit dizzy when sitting up. Orthostatic vital signs obtained, confirmed orthostatic hypotension. Likely this is due to polypharmacy. Will stop his spironolactone d/t orthostatic hypotension and hx hyperkalemia. Will obtain orthostatics and review his medications again at his next visit.  - Orthostatics at next visit - Stop spironolactone

## 2020-12-30 NOTE — Assessment & Plan Note (Signed)
Patient reports he is not taking his sugars at home as his glucometer broke. Will order another today. At next visit, can discuss increasing Trulicity for further glycemic control with added benefit of weight loss. - C/w metformin 1000mg  BID, Trulicity 0.75mg  weekly, glipizide 10mg  BID

## 2020-12-30 NOTE — Assessment & Plan Note (Signed)
Patient has history of hyperkalemia, thought most likely to be 2/2 medications. At most recent hospitalization, patient was discharged with kayexelate and instructed to stop lisinopril and spironolactone. Today during his visit, patient mentions he is still taking the Kayexelate. It also appears he has continued to take lisinopril and spironolactone. Discussed that he should stop all of these medications immediately. Will switch to combination pill to better simplify regimen. Will also check BMET today. Hopeful potassium will normalize after stopping spironolactone. - Stop spironolactone, lisinopril, kayexalate - BMET today

## 2020-12-31 LAB — BMP8+ANION GAP
Anion Gap: 14 mmol/L (ref 10.0–18.0)
BUN/Creatinine Ratio: 27 — ABNORMAL HIGH (ref 9–20)
BUN: 37 mg/dL — ABNORMAL HIGH (ref 6–24)
CO2: 20 mmol/L (ref 20–29)
Calcium: 9.1 mg/dL (ref 8.7–10.2)
Chloride: 107 mmol/L — ABNORMAL HIGH (ref 96–106)
Creatinine, Ser: 1.36 mg/dL — ABNORMAL HIGH (ref 0.76–1.27)
Glucose: 170 mg/dL — ABNORMAL HIGH (ref 65–99)
Potassium: 5 mmol/L (ref 3.5–5.2)
Sodium: 141 mmol/L (ref 134–144)
eGFR: 63 mL/min/{1.73_m2} (ref >59)

## 2021-01-02 ENCOUNTER — Other Ambulatory Visit: Payer: Self-pay | Admitting: Student

## 2021-01-02 DIAGNOSIS — E1165 Type 2 diabetes mellitus with hyperglycemia: Secondary | ICD-10-CM

## 2021-01-03 ENCOUNTER — Ambulatory Visit: Payer: 59 | Admitting: Occupational Therapy

## 2021-01-03 ENCOUNTER — Other Ambulatory Visit: Payer: Self-pay

## 2021-01-03 ENCOUNTER — Encounter: Payer: Self-pay | Admitting: Occupational Therapy

## 2021-01-03 DIAGNOSIS — M6281 Muscle weakness (generalized): Secondary | ICD-10-CM

## 2021-01-03 DIAGNOSIS — R2681 Unsteadiness on feet: Secondary | ICD-10-CM

## 2021-01-03 DIAGNOSIS — R41842 Visuospatial deficit: Secondary | ICD-10-CM

## 2021-01-03 DIAGNOSIS — R278 Other lack of coordination: Secondary | ICD-10-CM

## 2021-01-03 NOTE — Therapy (Signed)
[facility] [address] [place], [name], [address] Phone: [phone]   Fax:  [phone]  Occupational Therapy Treatment  Patient Details  Name: [name]. MRN: [id number] Date of Birth: 1971/07/16 Referring Provider (OT): Dr. Gerarda Gunther   Encounter Date: 01/03/2021   OT End of Session - 01/03/21 0810    Visit Number 7    Number of Visits 13    Date for OT Re-Evaluation 01/07/21    Authorization Type U[employer]--copay per day, VL:60 each PT/OT/ST.   (3 previous OT visits used this year)    Authorization Time Period wk 5/6    Authorization - Visit Number 19    Authorization - Number of Visits 89    OT Start Time 0805    OT Stop Time 0845    OT Time Calculation (min) 40 min    Activity Tolerance Patient tolerated treatment well    Behavior During Therapy WFL for tasks assessed/performed           Past Medical History:  Diagnosis Date  . Acute pancreatitis 01/22/2019  . Diabetes mellitus   . GERD (gastroesophageal reflux disease)   . History of alcohol abuse   . History of cocaine use   . History of echocardiogram    a. Echo 4/14: Moderate LVH, vigorous LVEF, EF 65-70%, normal wall motion, grade 2 diastolic dysfunction, mildly dilated aortic root and ascending aorta, ascending aorta 40 mm, aortic root 38 mm, mild LAE  . Hx of cardiovascular stress test    a. GXT 5/14: No ischemic changes  //  b. ETT-Myoview 3/16:  Low risk, no ischemia, EF 58%  . Hyperlipidemia   . Hypertension   . Morbid obesity ([place])   . Paroxysmal atrial fibrillation ([place])    Occurring in 2008, with several recurrence since then (including in the setting of + cocaine on UDS).  . Sleep apnea    uses CPAP  . SVT (supraventricular tachycardia) ([place])    mid to long RP SVT 09/2019    Past Surgical History:  Procedure Laterality Date  . APPENDECTOMY    . ATRIAL FIBRILLATION ABLATION N/A 11/27/2019   Procedure: ATRIAL  FIBRILLATION ABLATION;  Surgeon: Thompson Grayer, MD;  Location: [facility];  Service: Cardiovascular;  Laterality: N/A;  . ROTATOR CUFF REPAIR    . SVT ABLATION N/A 11/27/2019   Procedure: SVT ABLATION;  Surgeon: Thompson Grayer, MD;  Location: [facility];  Service: Cardiovascular;  Laterality: N/A;  . TONSILLECTOMY      There were no vitals filed for this visit.   Subjective Assessment - 01/03/21 0806    Subjective  pt denies pain    Pertinent History Acute multifocal cerebral infarcts with R-sided weakness 11/18/20.    PMH: HTN, DM, sleep apnea, narcolepsy, hx of ETOH and cocaine abuse, CKD,  hyperkalemia, Paroxysmal Atrial fibrillation (s/p ablation in 2021), hx of CVA 06/01/20 with L-sided deficits    Currently in Pain? No/denies            Placing grooved pegs in pegboard with R hand for incr coordination and in-hand manipulation with min difficulty and incr time.   Picking up blocks using gripper (level 3, silver spring) for sustained grip strength with min-mod difficulty and incr time.  Functional reaching to place/remove clothespins on vertical pole (1-8lb resistance clothespins) for incr strength/activity tolerance.         OT Education - 01/03/21 1011    Education Details  Upgraded yellow theraband HEP--see pt instructions    Person(s) Educated Patient    Methods Explanation;Demonstration;Handout;Verbal cues    Comprehension Verbalized understanding;Returned demonstration;Verbal cues required            OT Short Term Goals - 01/03/21 0816      OT SHORT TERM GOAL #1   Title Pt will be independent with HEP for RUE coordination and strength.--check STGs 12/24/20 (extended due to seen for decr frequency initially)    Time 4    Period Weeks    Status Achieved      OT SHORT TERM GOAL #2   Title Pt will perform bathing with supervision for transfer.    Time 4    Period Weeks    Status On-going   min A     OT SHORT TERM GOAL #3   Title Pt will perform UB  dressing mod I.    Time 4    Period Weeks    Status Achieved      OT SHORT TERM GOAL #4   Title Pt will peform LB dressing with min A.    Time 4    Period Weeks    Status Achieved   12/16/20:  supervision     OT SHORT TERM GOAL #5   Title Pt will improve coordination for ADLs as shown by improving time on 9-hole peg test by at least 8sec with R hand.    Baseline 54.4sec    Time 4    Period Weeks    Status Achieved   12/29/20-42.22 secs            OT Long Term Goals - 01/03/21 0818      OT LONG TERM GOAL #1   Title Pt will perform simple snack prep and home maintenance tasks mod I.    Time 6    Period Weeks    Status On-going   01/03/21:  met with snack prep, ongoing for home maintenance     OT LONG TERM GOAL #2   Title Pt will improve coordination for ADLs as shown by improving time on 9-hole peg test by at least 15sec with R hand.    Baseline 54.4sec    Time 6    Period Weeks    Status New      OT LONG TERM GOAL #3   Title Pt will improve R grip strength by at least 15lbs to assist in opening containers and lifting objects.    Baseline R-40.1, L-76lbs    Time 6    Period Weeks    Status New      OT LONG TERM GOAL #4   Title Pt will perform LB dressing mod I.    Time 6    Period Weeks    Status New      OT LONG TERM GOAL #5   Title Pt wil perform envirnonmental scanning with 90% or better accuracy    Baseline 14/16 , 87% accuracy located first pass, for basic    Time 6    Period Weeks    Status New      OT LONG TERM GOAL #6   Title ---                 Plan - 01/03/21 0165    Clinical Impression Statement Pt is progressing towards goals with improving RUE coordination, strength, and functional use.    OT Occupational Profile and History Problem Focused Assessment - Including review of records relating  to presenting problem    Occupational performance deficits (Please refer to evaluation for details): IADL's;ADL's;Leisure;Work    Marketing executive /  Function / Physical Skills IADL;Coordination;FMC;Decreased knowledge of precautions;UE functional use;ADL;Strength;Dexterity;Balance;Vision;Mobility;Sensation    Cognitive Skills Safety Awareness   pt reports long hx of learning difficulties and dyslexia   Rehab Potential Good    Clinical Decision Making Several treatment options, min-mod task modification necessary    Comorbidities Affecting Occupational Performance: May have comorbidities impacting occupational performance    Modification or Assistance to Complete Evaluation  Min-Moderate modification of tasks or assist with assess necessary to complete eval    OT Frequency 2x / week    OT Duration 6 weeks   +eval or 13 visits over extended weeks (due to potential scheduling conflicts)   OT Treatment/Interventions Therapeutic activities;Therapeutic exercise;Cognitive remediation/compensation;Neuromuscular education;Functional Mobility Training;Visual/perceptual remediation/compensation;Patient/family education;Self-care/ADL training;Moist Heat;Fluidtherapy;DME and/or AE instruction;Cryotherapy;Passive range of motion;Manual Therapy;Paraffin    Plan begin checking goals with anticipated d/c next week    Consulted and Agree with Plan of Care Patient           Patient will benefit from skilled therapeutic intervention in order to improve the following deficits and impairments:   Body Structure / Function / Physical Skills: IADL,Coordination,FMC,Decreased knowledge of precautions,UE functional use,ADL,Strength,Dexterity,Balance,Vision,Mobility,Sensation Cognitive Skills: Safety Awareness (pt reports long hx of learning difficulties and dyslexia)     Visit Diagnosis: Other lack of coordination  Visuospatial deficit  Unsteadiness on feet  Muscle weakness (generalized)    Problem List Patient Active Problem List   Diagnosis Date Noted  . Dizziness 12/30/2020  . Cerebral thrombosis with cerebral infarction 11/20/2020  . Right sided  weakness 11/19/2020  . Hyperkalemia 11/19/2020  . AKI (acute kidney injury) ([place])   . Dysarthria   . Amnesia 09/08/2020  . 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 Acute cerebrovascular accident (CVA) ([place]) 06/01/2020  . Paronychia of right thumb 05/10/2020  . Mediastinal adenopathy 01/06/2020  . Secondary hypercoagulable state ([place]) 10/22/2019  . Morbid obesity due to excess calories ([place]) 12/08/2016  . Paroxysmal atrial fibrillation ([place]) 01/23/2013  . Diabetes mellitus ([place]) 01/23/2013  . Hypertension 01/23/2013  . Tobacco user 01/23/2013  . Chronic diastolic heart failure ([place]) 01/21/2013  . 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 Shoulder pain, right 02/15/2011  . Seasonal and perennial allergic rhinitis 02/13/2008  . Dyslipidemia 02/12/2008  . Cocaine abuse ([place]) 02/12/2008  . Obstructive sleep apnea 02/12/2008    [facility] 01/03/2021, 12:38 PM  [facility] [address] [place], [name], [address] Phone: [phone]   Fax:  [phone]  Name: Servando Kyllonen. MRN: [id number] Date of Birth: 28-Oct-1970   Vianne Bulls, OTR/L [facility] [address] [place], [place]  [address] [phone] phone [phone] 01/03/21 12:38 PM

## 2021-01-03 NOTE — Patient Instructions (Signed)
   Reverse Fly / Shoulder Retraction    Extend both arms in front of body at BELLY BUTTON HEIGHT, palms down, holding band. Move arms out to sides, squeeze shoulder blades together. Repeat 10 times. Do 1-2 sessions per day.  Scapular Retraction: Bilateral    Facing anchor, pull arms back, bringing shoulder blades together. Repeat 10 times per set. Do 1-2 sessions per day.   Strengthening: Resisted Flexion   Attach tube to door.  Hold tubing with one arm at side. Pull forward and up with elbow straight. Move shoulder through pain-free range of motion, no further than shoulder height. Repeat 10 times per set.  Do 1-2 sessions per day.    Strengthening: Resisted Extension   Attach one end to door.  Hold tubing in one hand, arm forward. Pull arm back, elbow straight. Repeat 10 times per set. Do 1-2 sessions per day.

## 2021-01-03 NOTE — Progress Notes (Signed)
Internal Medicine Clinic Attending ? ?Case discussed with Dr. Braswell  At the time of the visit.  We reviewed the resident?s history and exam and pertinent patient test results.  I agree with the assessment, diagnosis, and plan of care documented in the resident?s note.  ?

## 2021-01-05 ENCOUNTER — Other Ambulatory Visit: Payer: Self-pay

## 2021-01-05 ENCOUNTER — Ambulatory Visit: Payer: 59

## 2021-01-05 ENCOUNTER — Ambulatory Visit: Payer: 59 | Admitting: Occupational Therapy

## 2021-01-05 ENCOUNTER — Encounter: Payer: Self-pay | Admitting: Occupational Therapy

## 2021-01-05 ENCOUNTER — Ambulatory Visit: Payer: 59 | Admitting: Physical Therapy

## 2021-01-05 ENCOUNTER — Encounter: Payer: Self-pay | Admitting: Physical Therapy

## 2021-01-05 VITALS — BP 145/92

## 2021-01-05 VITALS — BP 136/93

## 2021-01-05 DIAGNOSIS — M6281 Muscle weakness (generalized): Secondary | ICD-10-CM

## 2021-01-05 DIAGNOSIS — R2689 Other abnormalities of gait and mobility: Secondary | ICD-10-CM

## 2021-01-05 DIAGNOSIS — R41841 Cognitive communication deficit: Secondary | ICD-10-CM

## 2021-01-05 DIAGNOSIS — R41842 Visuospatial deficit: Secondary | ICD-10-CM

## 2021-01-05 DIAGNOSIS — F8089 Other developmental disorders of speech and language: Secondary | ICD-10-CM

## 2021-01-05 DIAGNOSIS — I69351 Hemiplegia and hemiparesis following cerebral infarction affecting right dominant side: Secondary | ICD-10-CM

## 2021-01-05 DIAGNOSIS — R278 Other lack of coordination: Secondary | ICD-10-CM

## 2021-01-05 DIAGNOSIS — R471 Dysarthria and anarthria: Secondary | ICD-10-CM

## 2021-01-05 DIAGNOSIS — R2681 Unsteadiness on feet: Secondary | ICD-10-CM

## 2021-01-05 NOTE — Therapy (Addendum)
[facility] [address] [place], [name], [address] Phone: [phone]   Fax:  [phone]  Occupational Therapy Treatment  Patient Details  Name: [name]. MRN: [id number] Date of Birth: 11-Aug-1971 Referring Provider (OT): Dr. Gerarda Gunther   Encounter Date: 01/05/2021   OT End of Session - 01/05/21 0922    Visit Number 8    Number of Visits 13    Date for OT Re-Evaluation 01/07/21    Authorization Type UHC--copay per day, VL:60 each PT/OT/ST.   (3 previous OT visits used this year)    Authorization Time Period wk 5/6    Authorization - Visit Number 20    Authorization - Number of Visits 77    OT Start Time [phone]    OT Stop Time 0930    OT Time Calculation (min) 38 min    Activity Tolerance Patient tolerated treatment well    Behavior During Therapy WFL for tasks assessed/performed           Past Medical History:  Diagnosis Date  . Acute pancreatitis 01/22/2019  . Diabetes mellitus   . GERD (gastroesophageal reflux disease)   . History of alcohol abuse   . History of cocaine use   . History of echocardiogram    a. Echo 4/14: Moderate LVH, vigorous LVEF, EF 65-70%, normal wall motion, grade 2 diastolic dysfunction, mildly dilated aortic root and ascending aorta, ascending aorta 40 mm, aortic root 38 mm, mild LAE  . Hx of cardiovascular stress test    a. GXT 5/14: No ischemic changes  //  b. ETT-Myoview 3/16:  Low risk, no ischemia, EF 58%  . Hyperlipidemia   . Hypertension   . Morbid obesity ([place])   . Paroxysmal atrial fibrillation ([place])    Occurring in 2008, with several recurrence since then (including in the setting of + cocaine on UDS).  . Sleep apnea    uses CPAP  . SVT (supraventricular tachycardia) ([place])    mid to long RP SVT 09/2019    Past Surgical History:  Procedure Laterality Date  . APPENDECTOMY    . ATRIAL FIBRILLATION ABLATION N/A 11/27/2019   Procedure: ATRIAL  FIBRILLATION ABLATION;  Surgeon: Thompson Grayer, MD;  Location: [facility];  Service: Cardiovascular;  Laterality: N/A;  . ROTATOR CUFF REPAIR    . SVT ABLATION N/A 11/27/2019   Procedure: SVT ABLATION;  Surgeon: Thompson Grayer, MD;  Location: [facility];  Service: Cardiovascular;  Laterality: N/A;  . TONSILLECTOMY      Vitals:   01/05/21 0923  BP: (!) 136/93     Subjective Assessment - 01/05/21 0854    Subjective  pt denies pain    Pertinent History Acute multifocal cerebral infarcts with R-sided weakness 11/18/20.    PMH: HTN, DM, sleep apnea, narcolepsy, hx of ETOH and cocaine abuse, CKD,  hyperkalemia, Paroxysmal Atrial fibrillation (s/p ablation in 2021), hx of CVA 06/01/20 with L-sided deficits    Patient Stated Goals improve R-sided strength    Currently in Pain? No/denies               Treatment: Started checking progress towards goals  Environmental scanning 93% on first pass, goal met  Purdue pegboard for increased fine motor coordination, mid difficulty/ v.c  Gripper set at level 3 silver spring for 1/2 blocks and level 4 for sustained grip, increased difficulty and drops on level 4  OT Education - 01/05/21 0925    Education Details reveiwed yellow theraband HEP 10-15 reps each, min v.c for positioning    Person(s) Educated Patient    Methods Explanation;Demonstration;Verbal cues    Comprehension Verbalized understanding;Returned demonstration;Verbal cues required            OT Short Term Goals - 01/03/21 0816      OT SHORT TERM GOAL #1   Title Pt will be independent with HEP for RUE coordination and strength.--check STGs 12/24/20 (extended due to seen for decr frequency initially)    Time 4    Period Weeks    Status Achieved      OT SHORT TERM GOAL #2   Title Pt will perform bathing with supervision for transfer.    Time 4    Period Weeks    Status On-going   min A     OT SHORT TERM GOAL #3   Title Pt will  perform UB dressing mod I.    Time 4    Period Weeks    Status Achieved      OT SHORT TERM GOAL #4   Title Pt will peform LB dressing with min A.    Time 4    Period Weeks    Status Achieved   12/16/20:  supervision     OT SHORT TERM GOAL #5   Title Pt will improve coordination for ADLs as shown by improving time on 9-hole peg test by at least 8sec with R hand.    Baseline 54.4sec    Time 4    Period Weeks    Status Achieved   12/29/20-42.22 secs            OT Long Term Goals - 01/05/21 0856      OT LONG TERM GOAL #1   Title Pt will perform simple snack prep and home maintenance tasks mod I.    Time 6    Period Weeks    Status On-going   01/03/21:  met with snack prep, ongoing for home maintenance     OT LONG TERM GOAL #2   Title Pt will improve coordination for ADLs as shown by improving time on 9-hole peg test by at least 15sec with R hand.    Baseline 54.4sec    Time 6    Period Weeks    Status On-going      OT LONG TERM GOAL #3   Title Pt will improve R grip strength by at least 15lbs to assist in opening containers and lifting objects.    Baseline R-40.1, L-76lbs    Time 6    Period Weeks    Status Achieved   77.8     OT LONG TERM GOAL #4   Title Pt will perform LB dressing mod I.    Time 6    Period Weeks    Status Achieved      OT LONG TERM GOAL #5   Title Pt wil perform envirnonmental scanning with 90% or better accuracy    Baseline 14/16 , 87% accuracy located first pass, for basic    Time 6    Period Weeks    Status Achieved   13/14 located first pass 93%     OT LONG TERM GOAL #6   Title ---                  Patient will benefit from skilled therapeutic intervention in order to improve the following deficits and impairments:  Strength, coordination      Plan - 01/11/21 1031    Clinical Impression Statement Pt is progressing towards goals with improved environmental scanning.    OT Occupational Profile and History Problem Focused  Assessment - Including review of records relating to presenting problem    Occupational performance deficits (Please refer to evaluation for details): IADL's;ADL's;Leisure;Work    Marketing executive / Function / Physical Skills IADL;Coordination;FMC;Decreased knowledge of precautions;UE functional use;ADL;Strength;Dexterity;Balance;Vision;Mobility;Sensation    Cognitive Skills Safety Awareness   pt reports long hx of learning difficulties and dyslexia   Rehab Potential Good    Clinical Decision Making Several treatment options, min-mod task modification necessary    Comorbidities Affecting Occupational Performance: May have comorbidities impacting occupational performance    Modification or Assistance to Complete Evaluation  Min-Moderate modification of tasks or assist with assess necessary to complete eval    OT Frequency 2x / week    OT Duration 6 weeks   +eval or 13 visits over extended weeks (due to potential scheduling conflicts)   OT Treatment/Interventions Therapeutic activities;Therapeutic exercise;Cognitive remediation/compensation;Neuromuscular education;Functional Mobility Training;Visual/perceptual remediation/compensation;Patient/family education;Self-care/ADL training;Moist Heat;Fluidtherapy;DME and/or AE instruction;Cryotherapy;Passive range of motion;Manual Therapy;Paraffin    Plan begin checking goals with anticipate d/c next week    Consulted and Agree with Plan of Care Patient            Visit Diagnosis: Other lack of coordination  Visuospatial deficit  Unsteadiness on feet  Muscle weakness (generalized)  Other abnormalities of gait and mobility  Hemiplegia and hemiparesis following cerebral infarction affecting right dominant side [facility])    Problem List Patient Active Problem List   Diagnosis Date Noted  . Dizziness 12/30/2020  . Cerebral thrombosis with cerebral infarction 11/20/2020  . Right sided weakness 11/19/2020  . Hyperkalemia 11/19/2020  . AKI (acute  kidney injury) ([place])   . Dysarthria   . Amnesia 09/08/2020  . 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 Acute cerebrovascular accident (CVA) ([place]) 06/01/2020  . Paronychia of right thumb 05/10/2020  . Mediastinal adenopathy 01/06/2020  . Secondary hypercoagulable state ([place]) 10/22/2019  . Morbid obesity due to excess calories ([place]) 12/08/2016  . Paroxysmal atrial fibrillation ([place]) 01/23/2013  . Diabetes mellitus ([place]) 01/23/2013  . Hypertension 01/23/2013  . Tobacco user 01/23/2013  . Chronic diastolic heart failure ([place]) 01/21/2013  . 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 Shoulder pain, right 02/15/2011  . Seasonal and perennial allergic rhinitis 02/13/2008  . Dyslipidemia 02/12/2008  . Cocaine abuse ([place]) 02/12/2008  . Obstructive sleep apnea 02/12/2008    RINE,KATHRYN 01/05/2021, 9:26 AM  [facility] [address] [place], [name], [address] Phone: [phone]   Fax:  [phone]  Name: Albin Duckett. MRN: [id number] Date of Birth: 1970/12/22

## 2021-01-05 NOTE — Therapy (Signed)
[facility] [address] [place], [name], [address] Phone: [phone]   Fax:  [phone]  Physical Therapy Treatment  Patient Details  Name: [name]. MRN: [id number] Date of Birth: 1971-10-07 Referring Provider (PT): Angelica Pou, MD   Encounter Date: 01/05/2021   PT End of Session - 01/05/21 1024    Visit Number 26    Number of Visits 32    Authorization Type [employer]-60 visit limit PT/OT/speech (each visit of each type counts); as of 11/22/2020, pt has had 11 TOTAL visits in 2022    Authorization - Visit Number 14    Authorization - Number of Visits 20    PT Start Time 1018    PT Stop Time 1100    PT Time Calculation (min) 42 min    Equipment Utilized During Treatment Gait belt    Activity Tolerance Patient tolerated treatment well;No increased pain    Behavior During Therapy WFL for tasks assessed/performed           Past Medical History:  Diagnosis Date  . Acute pancreatitis 01/22/2019  . Diabetes mellitus   . GERD (gastroesophageal reflux disease)   . History of alcohol abuse   . History of cocaine use   . History of echocardiogram    a. Echo 4/14: Moderate LVH, vigorous LVEF, EF 65-70%, normal wall motion, grade 2 diastolic dysfunction, mildly dilated aortic root and ascending aorta, ascending aorta 40 mm, aortic root 38 mm, mild LAE  . Hx of cardiovascular stress test    a. GXT 5/14: No ischemic changes  //  b. ETT-Myoview 3/16:  Low risk, no ischemia, EF 58%  . Hyperlipidemia   . Hypertension   . Morbid obesity ([place])   . Paroxysmal atrial fibrillation ([place])    Occurring in 2008, with several recurrence since then (including in the setting of + cocaine on UDS).  . Sleep apnea    uses CPAP  . SVT (supraventricular tachycardia) ([place])    mid to long RP SVT 09/2019    Past Surgical History:  Procedure Laterality Date  . APPENDECTOMY    . ATRIAL FIBRILLATION ABLATION N/A 11/27/2019    Procedure: ATRIAL FIBRILLATION ABLATION;  Surgeon: Thompson Grayer, MD;  Location: [facility];  Service: Cardiovascular;  Laterality: N/A;  . ROTATOR CUFF REPAIR    . SVT ABLATION N/A 11/27/2019   Procedure: SVT ABLATION;  Surgeon: Thompson Grayer, MD;  Location: [facility];  Service: Cardiovascular;  Laterality: N/A;  . TONSILLECTOMY      Vitals:   01/05/21 1022 01/05/21 1044 01/05/21 1052  BP: (!) 144/96 (!) 160/86 (!) 145/92     Subjective Assessment - 01/05/21 1022    Subjective Saw the PCP who changed med's around, was orthostatic in the office that day. Denies any dizzness/lightheadedness, had it that one day in the MD office. No falls or pain to report.    Pertinent History Recent hospitalization 2/4-11/21/20 (Dr. Rosalin Hawking and RN at d/c recommend OP PT)    Patient Stated Goals Pt's goals for therapy are to get back to where I was with strength and coordination.  11/22/20:  To get back where I was before this stroke.    Currently in Pain? No/denies    Pain Score 0-No pain                [facility] Adult PT Treatment/Exercise - 01/05/21 1031      Transfers   Transfers Sit  to Stand;Stand to Sit    Sit to Stand 7: Independent    Stand to Sit 6: Modified independent (Device/Increase time);Without upper extremity assist;To chair/3-in-1;5: Supervision      Ambulation/Gait   Ambulation/Gait Yes    Ambulation/Gait Assistance 5: Supervision    Ambulation/Gait Assistance Details around gym with session    Assistive device None    Gait Pattern Step-through pattern;Decreased hip/knee flexion - right    Ambulation Surface Level;Indoor      Knee/Hip Exercises: Standing   Lateral Step Up Both;1 set;10 reps;Hand Hold: 2;Step Height: 6";Limitations    Lateral Step Up Limitations in parallel bars with UE support- left stance leg on box with contralateral march x 10 reps. cues for increased hip flexion/march height neeed. then with right stance- attempted left march with right  knee buckling after ~3 reps. changed to stepping left foot up to box/back down for remaining 7 reps. min guard to min assist for balance.               Balance Exercises - 01/05/21 1037      Balance Exercises: Standing   Standing Eyes Closed Narrow base of support (BOS);Wide (BOA);Foam/compliant surface;Limitations;Head turns;Other reps (comment);30 secs    Standing Eyes Closed Limitations on airex with feet hip width apart, no UE support: feet together for EC 30 sec's x 3 reps, progressing to feet apart for EC head movements left<>right, up<>down for ~10 reps. min guard to min assist for balance.    Wall Bumps Hip;Eyes opened;Eyes closed;10 reps;Limitations    Wall Bumps Limitations on floor then on airex with EO, progressing to [facility] for 10 reps each/on each surface. min guard assist for safety with cues on form/technique.    Sit to Stand Standard surface;Without upper extremity support;Foam/compliant surface;Limitations    Sit to Stand Limitations seated at edge of mat with feet across red beam: sit<>stands x 10 reps with UE support to control descent. cues for tall posture and controlled descent with sitting.               PT Short Term Goals - 12/22/20 0911      PT SHORT TERM GOAL #1   Title Pt will be independent with revised HEP for improved strength, balance, gait.  TARGET 12/17/2020    Baseline 12/13/20 Pt reports doing current HEP and walking daily.    Time 4    Period Weeks    Status Achieved      PT SHORT TERM GOAL #2   Title Pt will improve TUG score to less than or equal to 18 seconds for decreased fall risk.    Baseline TUG 10.39 sec 12/13/20    Time 4    Period Weeks    Status Achieved      PT SHORT TERM GOAL #3   Title Pt will improve Berg Balance score to at least 19/56 for decreased fall risk.    Baseline 46/56 12/13/20    Time 4    Period Weeks    Status Achieved      PT SHORT TERM GOAL #4   Title Pt will improve 5x sit<>stand to less than or equal to 18  seconds to demonstrate improved functional strength.    Baseline 14.45  12/13/20    Time 4    Status Achieved      PT SHORT TERM GOAL #5   Title Pt will verbalize understanding of fall prevention in home environment.    Baseline 12/21/20 met  Time 4    Period Weeks    Status Achieved   unable due to time constraints            PT Long Term Goals - 12/22/20 0912      PT LONG TERM GOAL #1   Title Pt will be independent with final progression of HEP for improved gait and balance.  TARGET 01/14/2021    Time 8    Period Weeks    Status On-going      PT LONG TERM GOAL #2   Title Pt will improve TUG score to less than or equal to 13.5 sec for decreased fall risk.    Baseline met per STG check 10.39 sec    Time 8    Period Weeks    Status Achieved      PT LONG TERM GOAL #3   Title Pt will improve Berg Balance score to at least 52/56 for decreased fall risk.    Baseline 46/56 at STG check    Time 8    Period Weeks    Status Revised      PT LONG TERM GOAL #4   Title Pt will improve 5x sit<>stand to less than or equal to 13 sec for improved functional lower extremity strength.    Baseline 14.25 STG check    Time 8    Period Weeks    Status On-going      PT LONG TERM GOAL #5   Title Pt will improve gait velocity to at least 2 ft/sec for improved gait efficiency and decreased fall risk.    Baseline 0.96 ft/sec 11/22/20    Time 8    Period Weeks    Status On-going      PT LONG TERM GOAL #6   Title Pt will ambulate at least 1000 ft, indoors and outdoors surfaces, using least restrictive assistive device, mod independently, no LOB, for improved community ambulation.    Time 8    Period Weeks    Status New                 Plan - 01/05/21 1024    Clinical Impression Statement Today's skilled session continued to focus on strengthening and balance training with rest breaks taken as needed. BP remained within acceptable ranges this session however still tends to be on the  elevated side. The pt is progressing and should benefit from continued PT to progress toward unmet goals.    Personal Factors and Comorbidities Comorbidity 3+    Comorbidities diabetes, HTN, morbid obesity, history of polysubstance abuse (cocaine and alcohol)    Examination-Activity Limitations Locomotion Level;Transfers;Stairs    Examination-Participation Restrictions Community Activity;Yard Work    Stability/Clinical Decision Making Evolving/Moderate complexity    Rehab Potential Good    PT Frequency 2x / week    PT Duration 8 weeks   including week of recert, 05/21/7618   PT Treatment/Interventions ADLs/Self Care Home Management;Gait training;Stair training;Functional mobility training;Therapeutic activities;Therapeutic exercise;Balance training;Neuromuscular re-education;Patient/family education;DME Instruction    PT Next Visit Plan Curb negotiation and truck transfers with high steps.   Pt reports having rollator at home for long distances; at this point, train on outdoor surfaces with rollator.   Please check vitals  (we will need to monitor visit number for him, due to VL with insurance, now that he is back to OT and speech). continue with trials of braces for right LE.    PT Home Exercise Plan Access Code: [email or web address]    Consulted  and Agree with Plan of Care Patient           Patient will benefit from skilled therapeutic intervention in order to improve the following deficits and impairments:  Abnormal gait,Difficulty walking,Decreased balance,Decreased mobility  Visit Diagnosis: Other abnormalities of gait and mobility  Muscle weakness (generalized)  Unsteadiness on feet     Problem List Patient Active Problem List   Diagnosis Date Noted  . Dizziness 12/30/2020  . Cerebral thrombosis with cerebral infarction 11/20/2020  . Right sided weakness 11/19/2020  . Hyperkalemia 11/19/2020  . AKI (acute kidney injury) ([place])   . Dysarthria   . Amnesia 09/08/2020  . Cerebral embolism  with cerebral infarction 06/01/2020  . Acute cerebrovascular accident (CVA) ([place]) 06/01/2020  . Paronychia of right thumb 05/10/2020  . Mediastinal adenopathy 01/06/2020  . Secondary hypercoagulable state ([place]) 10/22/2019  . Morbid obesity due to excess calories ([place]) 12/08/2016  . Paroxysmal atrial fibrillation ([place]) 01/23/2013  . Diabetes mellitus ([place]) 01/23/2013  . Hypertension 01/23/2013  . Tobacco user 01/23/2013  . Chronic diastolic heart failure ([place]) 01/21/2013  . 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 Shoulder pain, right 02/15/2011  . Seasonal and perennial allergic rhinitis 02/13/2008  . Dyslipidemia 02/12/2008  . Cocaine abuse ([employer]) 02/12/2008  . Obstructive sleep apnea 02/12/2008    Willow Ora, PTA, [facility] [address], [place], [place] [address] [phone] 01/06/21, 8:18 AM   Name: [name]. MRN: [id number] Date of Birth: Nov 28, 1970

## 2021-01-05 NOTE — Therapy (Signed)
[facility] [address] [place], [name], [address] Phone: [phone]   Fax:  [phone]  Speech Language Pathology Treatment  Patient Details  Name: [name]. MRN: [id number] Date of Birth: [birth year] Referring Provider (SLP): Frann Rider, NP   Encounter Date: 01/05/2021   End of Session - 01/05/21 0937    Visit Number 13    Number of Visits 17    Date for SLP Re-Evaluation 01/13/21    SLP Start Time 0803    SLP Stop Time  6962    SLP Time Calculation (min) 34 min    Activity Tolerance Patient tolerated treatment well         1  Past Medical History:  Diagnosis Date  . Acute pancreatitis 01/22/2019  . Diabetes mellitus   . GERD (gastroesophageal reflux disease)   . History of alcohol abuse   . History of cocaine use   . History of echocardiogram    a. Echo 4/14: Moderate LVH, vigorous LVEF, EF 65-70%, normal wall motion, grade 2 diastolic dysfunction, mildly dilated aortic root and ascending aorta, ascending aorta 40 mm, aortic root 38 mm, mild LAE  . Hx of cardiovascular stress test    a. GXT 5/14: No ischemic changes  //  b. ETT-Myoview 3/16:  Low risk, no ischemia, EF 58%  . Hyperlipidemia   . Hypertension   . Morbid obesity ([place])   . Paroxysmal atrial fibrillation ([place])    Occurring in 2008, with several recurrence since then (including in the setting of + cocaine on UDS).  . Sleep apnea    uses CPAP  . SVT (supraventricular tachycardia) ([place])    mid to long RP SVT 09/2019    Past Surgical History:  Procedure Laterality Date  . APPENDECTOMY    . ATRIAL FIBRILLATION ABLATION N/A 11/27/2019   Procedure: ATRIAL FIBRILLATION ABLATION;  Surgeon: Thompson Grayer, MD;  Location: [facility];  Service: Cardiovascular;  Laterality: N/A;  . ROTATOR CUFF REPAIR    . SVT ABLATION N/A 11/27/2019   Procedure: SVT ABLATION;  Surgeon: Thompson Grayer, MD;  Location: [facility];   Service: Cardiovascular;  Laterality: N/A;  . TONSILLECTOMY      There were no vitals filed for this visit.   Subjective Assessment - 01/05/21 0813    Subjective "Yah I did real good (at the [facility])."    Currently in Pain? No/denies                 ADULT SLP TREATMENT - 01/05/21 0813      General Information   Behavior/Cognition Cooperative;Alert;Pleasant mood      Treatment Provided   Treatment provided Cognitive-Linquistic      Cognitive-Linquistic Treatment   Treatment focused on Dysarthria    Skilled Treatment SLP engaged pt with mod complex conversation and pt mainained intelligibility 100% for two 15-20 minute conversations. [name]'s topic maintenance and conversational flow was WNL. He cont to endorse attention difficulties, however, note pt was dx'd ADD prior to his CVA in August 2021 and has chosen not to pick up Adderall med waiting for him as of 12-21-20 at his desired pharmacy. [name] agreed it was appropriate at this time to reduce ST to once/week - last appointment with ST is Monday 01-10-21 to ensure success continues. If all looks good pt agrees with d/c that day.      Assessment / Recommendations / Plan   Plan Continue with current plan of care  d/c likely next session     Progression Toward Goals   Progression toward goals Progressing toward goals              SLP Short Term Goals - 12/16/20 0920      SLP SHORT TERM GOAL #1   Title pt will tell SLP three ways to compensate for his memory in 3 sessions, with modifeid independence    Baseline 11-05-20, 11-08-20 (calendar), 12-03-20 (phone alarms for meds)    Status Achieved      SLP SHORT TERM GOAL #2   Title pt will demonstrate selective attention to simple-mod complex task for 8 mintues in quiet environment in 2 sessions    Baseline 10-14-20, 11-05-20    Status Partially Met      SLP SHORT TERM GOAL #3   Title pt will demo speech compensations for intelligibility 80% in 10 minutes simple conversation     Baseline 12-13-20    Status Achieved   denies ongoing dysarthria     SLP SHORT TERM GOAL #4   Title pt will perform HEP for dysarthria with occasional min A for exaggeration of speech sounds    Period Weeks    Status On-going            SLP Long Term Goals - 01/05/21 0939      SLP LONG TERM GOAL #1   Title pt will demo simple divided attention skills in functinoal cogntive communcation tasks in 3 sessions    Period --   or 17 visits, for all LTGs   Status Deferred   pt at baseline, per his report     SLP LONG TERM GOAL #2   Title pt will report improving swallow skills by telling SLP frequency of cough during liquid consumption is once/month    Status Deferred   due to WNL MBSS     SLP LONG TERM GOAL #3   Title pt will use memory compensations for appointments, medications, exercise/homework tracking, and/or to-do lists, etc in 5 sessions    Baseline 11-05-20 (meds), 12-03-20, 12-10-20, 12-13-20    Status Achieved      SLP LONG TERM GOAL #4   Title pt will undergo objective swallow assessment PRN    Time 1    Period Weeks    Status Achieved      SLP LONG TERM GOAL #5   Title pt will maintain WNL speech intelligibility x2 sessions    Baseline 01-05-21    Time 4    Period Weeks    Status New            Plan - 01/05/21 0937    Clinical Impression Statement [name] presents today with premorbid attention deficit, MBSS revealed WNL swallow ability. [name] told SLP today that speech and thinking are primarily at baseline. Pt would cont to benefit from skilled ST addressing communication skills - likely d/c next visit. ST decreasing to once/week - pt agrees. Continuing ST at this time for one more visit to ensure pt maintains success after 2-3 weeks of minimal ST. Pt agrees with this.    Speech Therapy Frequency 1x /week    Duration 8 weeks    Treatment/Interventions Aspiration precaution training;Pharyngeal strengthening exercises;Diet toleration management by SLP;Trials of  upgraded texture/liquids;Cueing hierarchy;Cognitive reorganization;Internal/external aids;Patient/family education;Compensatory strategies;SLP instruction and feedback;Functional tasks    Potential to Achieve Goals Good    Potential Considerations Previous level of function    SLP Home Exercise Plan dysarthria HEP, swallow strats  Consulted and Agree with Plan of Care Patient           Patient will benefit from skilled therapeutic intervention in order to improve the following deficits and impairments:   Dysarthria and anarthria  Cognitive communication deficit  Developmental dysfluency    Problem List Patient Active Problem List   Diagnosis Date Noted  . Dizziness 12/30/2020  . Cerebral thrombosis with cerebral infarction 11/20/2020  . Right sided weakness 11/19/2020  . Hyperkalemia 11/19/2020  . AKI (acute kidney injury) ([place])   . Dysarthria   . Amnesia 09/08/2020  . 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 Acute cerebrovascular accident (CVA) ([place]) 06/01/2020  . Paronychia of right thumb 05/10/2020  . Mediastinal adenopathy 01/06/2020  . Secondary hypercoagulable state ([place]) 10/22/2019  . Morbid obesity due to excess calories ([place]) 12/08/2016  . Paroxysmal atrial fibrillation ([place]) 01/23/2013  . Diabetes mellitus ([place]) 01/23/2013  . Hypertension 01/23/2013  . Tobacco user 01/23/2013  . Chronic diastolic heart failure ([place]) 01/21/2013  . 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 Shoulder pain, right 02/15/2011  . Seasonal and perennial allergic rhinitis 02/13/2008  . Dyslipidemia 02/12/2008  . Cocaine abuse ([place]) 02/12/2008  . Obstructive sleep apnea 02/12/2008    [facility], CCC-SLP  01/05/2021, 9:41 AM  [facility] [address] [place], [name], [address] Phone: [phone]   Fax:  [phone]   Name: Richie Bonanno. MRN: [id number] Date of Birth: 06-13-1971

## 2021-01-06 ENCOUNTER — Encounter: Payer: Self-pay | Admitting: Physical Therapy

## 2021-01-06 ENCOUNTER — Ambulatory Visit: Payer: 59 | Admitting: Physical Therapy

## 2021-01-06 ENCOUNTER — Ambulatory Visit: Payer: 59

## 2021-01-06 ENCOUNTER — Ambulatory Visit: Payer: 59 | Admitting: Occupational Therapy

## 2021-01-06 VITALS — BP 126/86

## 2021-01-06 DIAGNOSIS — R2681 Unsteadiness on feet: Secondary | ICD-10-CM

## 2021-01-06 DIAGNOSIS — M6281 Muscle weakness (generalized): Secondary | ICD-10-CM

## 2021-01-06 DIAGNOSIS — R2689 Other abnormalities of gait and mobility: Secondary | ICD-10-CM

## 2021-01-07 NOTE — Therapy (Signed)
[facility] [address] [place], [name], [address] Phone: [phone]   Fax:  [phone]  Physical Therapy Treatment  Patient Details  Name: [name]. MRN: [id number] Date of Birth: 10-14-1971 Referring Provider (PT): Angelica Pou, MD   Encounter Date: 01/06/2021   PT End of Session - 01/06/21 0938    Visit Number 27    Number of Visits 32    Authorization Type UHC-60 visit limit PT/OT/speech (each visit of each type counts); as of 11/22/2020, pt has had 11 TOTAL visits in 2022    Authorization - Visit Number 15    Authorization - Number of Visits 20    PT Start Time 0931    PT Stop Time 1010    PT Time Calculation (min) 39 min    Equipment Utilized During Treatment Gait belt    Activity Tolerance Patient tolerated treatment well;No increased pain    Behavior During Therapy WFL for tasks assessed/performed           Past Medical History:  Diagnosis Date  . Acute pancreatitis 01/22/2019  . Diabetes mellitus   . GERD (gastroesophageal reflux disease)   . History of alcohol abuse   . History of cocaine use   . History of echocardiogram    a. Echo 4/14: Moderate LVH, vigorous LVEF, EF 65-70%, normal wall motion, grade 2 diastolic dysfunction, mildly dilated aortic root and ascending aorta, ascending aorta 40 mm, aortic root 38 mm, mild LAE  . Hx of cardiovascular stress test    a. GXT 5/14: No ischemic changes  //  b. ETT-Myoview 3/16:  Low risk, no ischemia, EF 58%  . Hyperlipidemia   . Hypertension   . Morbid obesity ([place])   . Paroxysmal atrial fibrillation ([place])    Occurring in 2008, with several recurrence since then (including in the setting of + cocaine on UDS).  . Sleep apnea    uses CPAP  . SVT (supraventricular tachycardia) ([place])    mid to long RP SVT 09/2019    Past Surgical History:  Procedure Laterality Date  . APPENDECTOMY    . ATRIAL FIBRILLATION ABLATION N/A 11/27/2019    Procedure: ATRIAL FIBRILLATION ABLATION;  Surgeon: Thompson Grayer, MD;  Location: [facility];  Service: Cardiovascular;  Laterality: N/A;  . ROTATOR CUFF REPAIR    . SVT ABLATION N/A 11/27/2019   Procedure: SVT ABLATION;  Surgeon: Thompson Grayer, MD;  Location: [facility];  Service: Cardiovascular;  Laterality: N/A;  . TONSILLECTOMY      Vitals:   01/06/21 0937 01/06/21 0957 01/06/21 1008  BP: 134/85 (!) 127/92 126/86     Subjective Assessment - 01/06/21 0937    Subjective No new complaints. No falls or pain. Right LE was tired after yesterday's session.    Pertinent History Recent hospitalization 2/4-11/21/20 (Dr. Rosalin Hawking and RN at d/c recommend OP PT)    Patient Stated Goals Pt's goals for therapy are to get back to where I was with strength and coordination.  11/22/20:  To get back where I was before this stroke.    Currently in Pain? No/denies    Pain Score 0-No pain                  [facility] Adult PT Treatment/Exercise - 01/06/21 0942      Transfers   Transfers Sit to Stand;Stand to Sit    Sit to Stand 7: Independent    Stand to Sit  7: Independent      Ambulation/Gait   Ambulation/Gait Yes    Ambulation/Gait Assistance 5: Supervision    Ambulation/Gait Assistance Details around gym with session    Assistive device None    Gait Pattern Step-through pattern;Decreased hip/knee flexion - right    Ambulation Surface Level;Indoor      Knee/Hip Exercises: Aerobic   Other Aerobic Scifit level 2.0 with UE/LE's for 8 minutes with goal 35-45 rpm for activity tolerance and strengthening.      Knee/Hip Exercises: Machines for Strengthening   Cybex Leg Press Bil LE's- 60#'s for 10 reps, then right LE only 40#'s x 10 reps. guarding at knee to prevent hyperextension with cues for slow, controlled movements.               Balance Exercises - 01/06/21 0958      Balance Exercises: Standing   Tandem Gait Forward;Retro;Intermittent upper extremity  support;Foam/compliant surface;3 reps;Limitations    Tandem Gait Limitations on blue foam beam for 3 laps each way with intermittent touch to bars for balance.    Sidestepping Foam/compliant support;3 reps;Limitations    Sidestepping Limitations on blue foam beam for 3 laps each way with intermittent touch to bars for balance. cues for step length, to lift feet/not slide them.               PT Short Term Goals - 12/22/20 0911      PT SHORT TERM GOAL #1   Title Pt will be independent with revised HEP for improved strength, balance, gait.  TARGET 12/17/2020    Baseline 12/13/20 Pt reports doing current HEP and walking daily.    Time 4    Period Weeks    Status Achieved      PT SHORT TERM GOAL #2   Title Pt will improve TUG score to less than or equal to 18 seconds for decreased fall risk.    Baseline TUG 10.39 sec 12/13/20    Time 4    Period Weeks    Status Achieved      PT SHORT TERM GOAL #3   Title Pt will improve Berg Balance score to at least 19/56 for decreased fall risk.    Baseline 46/56 12/13/20    Time 4    Period Weeks    Status Achieved      PT SHORT TERM GOAL #4   Title Pt will improve 5x sit<>stand to less than or equal to 18 seconds to demonstrate improved functional strength.    Baseline 14.45  12/13/20    Time 4    Status Achieved      PT SHORT TERM GOAL #5   Title Pt will verbalize understanding of fall prevention in home environment.    Baseline 12/21/20 met    Time 4    Period Weeks    Status Achieved   unable due to time constraints            PT Long Term Goals - 12/22/20 0912      PT LONG TERM GOAL #1   Title Pt will be independent with final progression of HEP for improved gait and balance.  TARGET 01/14/2021    Time 8    Period Weeks    Status On-going      PT LONG TERM GOAL #2   Title Pt will improve TUG score to less than or equal to 13.5 sec for decreased fall risk.    Baseline met per STG check 10.39 sec  Time 8    Period Weeks     Status Achieved      PT LONG TERM GOAL #3   Title Pt will improve Berg Balance score to at least 52/56 for decreased fall risk.    Baseline 46/56 at STG check    Time 8    Period Weeks    Status Revised      PT LONG TERM GOAL #4   Title Pt will improve 5x sit<>stand to less than or equal to 13 sec for improved functional lower extremity strength.    Baseline 14.25 STG check    Time 8    Period Weeks    Status On-going      PT LONG TERM GOAL #5   Title Pt will improve gait velocity to at least 2 ft/sec for improved gait efficiency and decreased fall risk.    Baseline 0.96 ft/sec 11/22/20    Time 8    Period Weeks    Status On-going      PT LONG TERM GOAL #6   Title Pt will ambulate at least 1000 ft, indoors and outdoors surfaces, using least restrictive assistive device, mod independently, no LOB, for improved community ambulation.    Time 8    Period Weeks    Status New                 Plan - 01/06/21 7829    Clinical Impression Statement Today's skilled session continued to focus on strengthening, balance training and activity tolerance. No issues noted or reported. BP remained in acceptable ranges to work in therapy today.    Personal Factors and Comorbidities Comorbidity 3+    Comorbidities diabetes, HTN, morbid obesity, history of polysubstance abuse (cocaine and alcohol)    Examination-Activity Limitations Locomotion Level;Transfers;Stairs    Examination-Participation Restrictions Community Activity;Yard Work    Stability/Clinical Decision Making Evolving/Moderate complexity    Rehab Potential Good    PT Frequency 2x / week    PT Duration 8 weeks   including week of recert, 02/18/2129   PT Treatment/Interventions ADLs/Self Care Home Management;Gait training;Stair training;Functional mobility training;Therapeutic activities;Therapeutic exercise;Balance training;Neuromuscular re-education;Patient/family education;DME Instruction    PT Next Visit Plan monitor vitials.  Begin to address LTGs due on 01/14/21.    PT Home Exercise Plan Access Code: [email or web address]    Consulted and Agree with Plan of Care Patient           Patient will benefit from skilled therapeutic intervention in order to improve the following deficits and impairments:  Abnormal gait,Difficulty walking,Decreased balance,Decreased mobility  Visit Diagnosis: Other abnormalities of gait and mobility  Muscle weakness (generalized)  Unsteadiness on feet     Problem List Patient Active Problem List   Diagnosis Date Noted  . Dizziness 12/30/2020  . Cerebral thrombosis with cerebral infarction 11/20/2020  . Right sided weakness 11/19/2020  . Hyperkalemia 11/19/2020  . AKI (acute kidney injury) ([place])   . Dysarthria   . Amnesia 09/08/2020  . 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 Acute cerebrovascular accident (CVA) ([place]) 06/01/2020  . Paronychia of right thumb 05/10/2020  . Mediastinal adenopathy 01/06/2020  . Secondary hypercoagulable state ([place]) 10/22/2019  . Morbid obesity due to excess calories ([place]) 12/08/2016  . Paroxysmal atrial fibrillation ([place]) 01/23/2013  . Diabetes mellitus ([place]) 01/23/2013  . Hypertension 01/23/2013  . Tobacco user 01/23/2013  . Chronic diastolic heart failure ([place]) 01/21/2013  . 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 Shoulder pain, right 02/15/2011  . Seasonal and  perennial allergic rhinitis 02/13/2008  . Dyslipidemia 02/12/2008  . Cocaine abuse ([place]) 02/12/2008  . Obstructive sleep apnea 02/12/2008    Willow Ora, PTA, [facility] [address] [place], [place] [address] [phone] 01/07/21, 11:33 AM   Name: [name]. MRN: [id number] Date of Birth: [birth year]

## 2021-01-10 ENCOUNTER — Ambulatory Visit: Payer: 59 | Admitting: Physical Therapy

## 2021-01-10 ENCOUNTER — Ambulatory Visit: Payer: 59 | Admitting: Occupational Therapy

## 2021-01-10 ENCOUNTER — Ambulatory Visit: Payer: 59

## 2021-01-12 ENCOUNTER — Ambulatory Visit: Payer: 59 | Admitting: Occupational Therapy

## 2021-01-12 ENCOUNTER — Ambulatory Visit: Payer: 59

## 2021-01-12 ENCOUNTER — Ambulatory Visit: Payer: 59 | Admitting: Physical Therapy

## 2021-01-13 ENCOUNTER — Telehealth: Payer: Self-pay | Admitting: *Deleted

## 2021-01-13 ENCOUNTER — Encounter: Payer: 59 | Admitting: Student

## 2021-01-13 NOTE — Telephone Encounter (Signed)
Spoke with patient regarding his missed appointment today/ patient was instructed to call the clinic [phone]) to reschedule this appointment. Patient states he tried to call the clinic.

## 2021-01-16 ENCOUNTER — Ambulatory Visit (HOSPITAL_BASED_OUTPATIENT_CLINIC_OR_DEPARTMENT_OTHER): Payer: 59 | Attending: Internal Medicine | Admitting: Internal Medicine

## 2021-01-18 ENCOUNTER — Ambulatory Visit: Payer: 59 | Attending: Internal Medicine | Admitting: Occupational Therapy

## 2021-01-18 ENCOUNTER — Other Ambulatory Visit: Payer: Self-pay

## 2021-01-18 ENCOUNTER — Ambulatory Visit: Payer: 59 | Admitting: Physical Therapy

## 2021-01-18 ENCOUNTER — Encounter: Payer: Self-pay | Admitting: Physical Therapy

## 2021-01-18 ENCOUNTER — Encounter: Payer: Self-pay | Admitting: Occupational Therapy

## 2021-01-18 VITALS — BP 134/84 | HR 65

## 2021-01-18 DIAGNOSIS — R278 Other lack of coordination: Secondary | ICD-10-CM

## 2021-01-18 DIAGNOSIS — M6281 Muscle weakness (generalized): Secondary | ICD-10-CM | POA: Diagnosis present

## 2021-01-18 DIAGNOSIS — R2689 Other abnormalities of gait and mobility: Secondary | ICD-10-CM | POA: Diagnosis present

## 2021-01-18 DIAGNOSIS — I69351 Hemiplegia and hemiparesis following cerebral infarction affecting right dominant side: Secondary | ICD-10-CM | POA: Diagnosis present

## 2021-01-18 DIAGNOSIS — R41842 Visuospatial deficit: Secondary | ICD-10-CM | POA: Insufficient documentation

## 2021-01-18 DIAGNOSIS — R2681 Unsteadiness on feet: Secondary | ICD-10-CM

## 2021-01-18 DIAGNOSIS — I69318 Other symptoms and signs involving cognitive functions following cerebral infarction: Secondary | ICD-10-CM | POA: Diagnosis present

## 2021-01-18 NOTE — Therapy (Signed)
[facility] [address] [place], [name], [address] Phone: [phone]   Fax:  [phone]  Occupational Therapy Treatment  Patient Details  Name: [name]. MRN: [id number] Date of Birth: Sep 03, 1971 Referring Provider (OT): Dr. Gerarda Gunther   Encounter Date: 01/18/2021   OT End of Session - 01/18/21 0853    Visit Number 9    Number of Visits 13    Authorization Type UHC--copay per day, VL:60 each PT/OT/ST.   (3 previous OT visits used this year)    Authorization - Visit Number 21    Authorization - Number of Visits 60    Activity Tolerance Patient tolerated treatment well    Behavior During Therapy WFL for tasks assessed/performed           Past Medical History:  Diagnosis Date  . Acute pancreatitis 01/22/2019  . Diabetes mellitus   . GERD (gastroesophageal reflux disease)   . History of alcohol abuse   . History of cocaine use   . History of echocardiogram    a. Echo 4/14: Moderate LVH, vigorous LVEF, EF 65-70%, normal wall motion, grade 2 diastolic dysfunction, mildly dilated aortic root and ascending aorta, ascending aorta 40 mm, aortic root 38 mm, mild LAE  . Hx of cardiovascular stress test    a. GXT 5/14: No ischemic changes  //  b. ETT-Myoview 3/16:  Low risk, no ischemia, EF 58%  . Hyperlipidemia   . Hypertension   . Morbid obesity ([place])   . Paroxysmal atrial fibrillation ([place])    Occurring in 2008, with several recurrence since then (including in the setting of + cocaine on UDS).  . Sleep apnea    uses CPAP  . SVT (supraventricular tachycardia) ([place])    mid to long RP SVT 09/2019    Past Surgical History:  Procedure Laterality Date  . APPENDECTOMY    . ATRIAL FIBRILLATION ABLATION N/A 11/27/2019   Procedure: ATRIAL FIBRILLATION ABLATION;  Surgeon: Thompson Grayer, MD;  Location: [facility];  Service: Cardiovascular;  Laterality: N/A;  . ROTATOR CUFF REPAIR    . SVT  ABLATION N/A 11/27/2019   Procedure: SVT ABLATION;  Surgeon: Thompson Grayer, MD;  Location: [facility];  Service: Cardiovascular;  Laterality: N/A;  . TONSILLECTOMY      There were no vitals filed for this visit.   Subjective Assessment - 01/18/21 0853    Subjective  pt denies pain    Pertinent History Acute multifocal cerebral infarcts with R-sided weakness 11/18/20.    PMH: HTN, DM, sleep apnea, narcolepsy, hx of ETOH and cocaine abuse, CKD,  hyperkalemia, Paroxysmal Atrial fibrillation (s/p ablation in 2021), hx of CVA 06/01/20 with L-sided deficits    Limitations no heavy lifting    Patient Stated Goals improve R-sided strength    Currently in Pain? No/denies                  Treatment Gripper set at level 3 to pick up 1 inch blocks for sustained grip, min difficulty/ drops, min .vc Therapist checked progress towards goals, pt agrees with plans for d/c  Reviewed theraband exercises from HEP, 15 reps each good performance.                OT Short Term Goals - 01/18/21 0854      OT SHORT TERM GOAL #1   Title Pt will be independent with HEP for RUE coordination and strength.--check STGs 12/24/20 (extended due  to seen for decr frequency initially)    Time 4    Period Weeks    Status Achieved      OT SHORT TERM GOAL #2   Title Pt will perform bathing with supervision for transfer.    Time 4    Period Weeks    Status Not Met   min A, pt was shown tub transfer bench, and leg lifter in case he wishes to pursue.     OT SHORT TERM GOAL #3   Title Pt will perform UB dressing mod I.    Time 4    Period Weeks    Status Achieved      OT SHORT TERM GOAL #4   Title Pt will peform LB dressing with min A.    Time 4    Period Weeks    Status Achieved   12/16/20:  supervision     OT SHORT TERM GOAL #5   Title Pt will improve coordination for ADLs as shown by improving time on 9-hole peg test by at least 8sec with R hand.    Baseline 54.4sec    Time 4    Period  Weeks    Status Achieved   12/29/20-42.22 secs            OT Long Term Goals - 01/18/21 0902      OT LONG TERM GOAL #1   Title Pt will perform simple snack prep and home maintenance tasks mod I.    Time 6    Period Weeks    Status Achieved   met with snack prep, not met for for home maintenance     OT LONG TERM GOAL #2   Title Pt will improve coordination for ADLs as shown by improving time on 9-hole peg test by at least 15sec with R hand.    Baseline 54.4sec    Time 6    Period Weeks    Status Achieved   47.90 secs     OT LONG TERM GOAL #3   Title Pt will improve R grip strength by at least 15lbs to assist in opening containers and lifting objects.    Baseline R-40.1, L-76lbs    Time 6    Period Weeks    Status Achieved   77.8     OT LONG TERM GOAL #4   Title Pt will perform LB dressing mod I.    Time 6    Period Weeks    Status Achieved      OT LONG TERM GOAL #5   Title Pt wil perform envirnonmental scanning with 90% or better accuracy    Baseline 14/16 , 87% accuracy located first pass, for basic    Time 6    Period Weeks    Status Achieved   13/14 located first pass 93%     OT LONG TERM GOAL #6   Title ---                 Plan - 01/18/21 4081    Clinical Impression Statement Pt demonstrates good overall progress. He agrees with plans for d/c.    OT Occupational Profile and History Problem Focused Assessment - Including review of records relating to presenting problem    Occupational performance deficits (Please refer to evaluation for details): IADL's;ADL's;Leisure;Work    Marketing executive / Function / Physical Skills IADL;Coordination;FMC;Decreased knowledge of precautions;UE functional use;ADL;Strength;Dexterity;Balance;Vision;Mobility;Sensation    Cognitive Skills Safety Awareness   pt reports long hx of learning difficulties  and dyslexia   Rehab Potential Good    Clinical Decision Making Several treatment options, min-mod task modification necessary     Comorbidities Affecting Occupational Performance: May have comorbidities impacting occupational performance    Modification or Assistance to Complete Evaluation  Min-Moderate modification of tasks or assist with assess necessary to complete eval    OT Frequency 2x / week    OT Duration 6 weeks   +eval or 13 visits over extended weeks (due to potential scheduling conflicts)   OT Treatment/Interventions Therapeutic activities;Therapeutic exercise;Cognitive remediation/compensation;Neuromuscular education;Functional Mobility Training;Visual/perceptual remediation/compensation;Patient/family education;Self-care/ADL training;Moist Heat;Fluidtherapy;DME and/or AE instruction;Cryotherapy;Passive range of motion;Manual Therapy;Paraffin    Plan d/c OT    Consulted and Agree with Plan of Care Patient           Patient will benefit from skilled therapeutic intervention in order to improve the following deficits and impairments:   Body Structure / Function / Physical Skills: IADL,Coordination,FMC,Decreased knowledge of precautions,UE functional use,ADL,Strength,Dexterity,Balance,Vision,Mobility,Sensation Cognitive Skills: Safety Awareness (pt reports long hx of learning difficulties and dyslexia)   OCCUPATIONAL THERAPY DISCHARGE SUMMARY   Current functional level related to goals / functional outcomes:Pt met all except 1 goal. He demonstrates good overall progress.   Remaining deficits: Decreased strength, decreased coordination, decreased balance   Education / Equipment: Pt was educated in HEP and tub bench transfers. Pt demonstrates understanding. Plan: Patient agrees to discharge.  Patient goals were partially met. Patient is being discharged due to being pleased with the current functional level.  ?????         Visit Diagnosis: Muscle weakness (generalized)  Unsteadiness on feet  Other lack of coordination  Visuospatial deficit  Hemiplegia and hemiparesis following cerebral  infarction affecting right dominant side (HCC)  Other abnormalities of gait and mobility  Other symptoms and signs involving cognitive functions following cerebral infarction    Problem List Patient Active Problem List   Diagnosis Date Noted  . Dizziness 12/30/2020  . Cerebral thrombosis with cerebral infarction 11/20/2020  . Right sided weakness 11/19/2020  . Hyperkalemia 11/19/2020  . AKI (acute kidney injury) ([place])   . Dysarthria   . Amnesia 09/08/2020  . 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 Acute cerebrovascular accident (CVA) ([place]) 06/01/2020  . Paronychia of right thumb 05/10/2020  . Mediastinal adenopathy 01/06/2020  . Secondary hypercoagulable state ([place]) 10/22/2019  . Morbid obesity due to excess calories ([place]) 12/08/2016  . Paroxysmal atrial fibrillation ([place]) 01/23/2013  . Diabetes mellitus ([place]) 01/23/2013  . Hypertension 01/23/2013  . Tobacco user 01/23/2013  . Chronic diastolic heart failure ([place]) 01/21/2013  . 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 Shoulder pain, right 02/15/2011  . Seasonal and perennial allergic rhinitis 02/13/2008  . Dyslipidemia 02/12/2008  . Cocaine abuse ([place]) 02/12/2008  . Obstructive sleep apnea 02/12/2008    [name] 01/18/2021, 9:15 AM  [facility] [address] [place], [name], [address] Phone: [phone]   Fax:  [phone]  Name: [name]. MRN: [id number] Date of Birth: 1971-05-13

## 2021-01-18 NOTE — Therapy (Signed)
[name], [name], [address] Phone: [phone]   Fax:  [phone]  Physical Therapy Treatment/Discharge Summary  Patient Details  Name: [name]. MRN: [id number] Date of Birth: Aug 16, 1971 Referring Provider (PT): Angelica Pou, MD   PHYSICAL THERAPY DISCHARGE SUMMARY  Visits from Start of Care: 28  Current functional level related to goals / functional outcomes: Pt has met 2 of 5 LTGs-see below   Remaining deficits: RLE weakness, fall risk, decreased balance, decreased endurance/safety with gait   Education / Equipment: Educated in [place], importance of consistent HEP performance, fall prevention.  Plan: Patient agrees to discharge.  Patient goals were partially met. Patient is being discharged due to                                                     ?????maximizing rehab potential at this time.       Encounter Date: 01/18/2021   PT End of Session - 01/18/21 0817    Visit Number 28    Number of Visits 32    Authorization Type [employer]-60 visit limit PT/OT/speech (each visit of each type counts); as of 11/22/2020, pt has had 11 TOTAL visits in 2022    Authorization - Visit Number 16    Authorization - Number of Visits 20    PT Start Time 6389   Pt arrives late   PT Stop Time 0842    PT Time Calculation (min) 28 min    Equipment Utilized During Treatment Gait belt    Activity Tolerance Patient tolerated treatment well;No increased pain    Behavior During Therapy WFL for tasks assessed/performed           Past Medical History:  Diagnosis Date  . Acute pancreatitis 01/22/2019  . Diabetes mellitus   . GERD (gastroesophageal reflux disease)   . History of alcohol abuse   . History of cocaine use   . History of echocardiogram    a. Echo 4/14: Moderate LVH, vigorous LVEF, EF 65-70%, normal wall motion, grade 2 diastolic dysfunction, mildly dilated aortic root and  ascending aorta, ascending aorta 40 mm, aortic root 38 mm, mild LAE  . Hx of cardiovascular stress test    a. GXT 5/14: No ischemic changes  //  b. ETT-Myoview 3/16:  Low risk, no ischemia, EF 58%  . Hyperlipidemia   . Hypertension   . Morbid obesity ([place])   . Paroxysmal atrial fibrillation ([place])    Occurring in 2008, with several recurrence since then (including in the setting of + cocaine on UDS).  . Sleep apnea    uses CPAP  . SVT (supraventricular tachycardia) ([place])    mid to long RP SVT 09/2019    Past Surgical History:  Procedure Laterality Date  . APPENDECTOMY    . ATRIAL FIBRILLATION ABLATION N/A 11/27/2019   Procedure: ATRIAL FIBRILLATION ABLATION;  Surgeon: Thompson Grayer, MD;  Location: [facility];  Service: Cardiovascular;  Laterality: N/A;  . ROTATOR CUFF REPAIR    . SVT ABLATION N/A 11/27/2019   Procedure: SVT ABLATION;  Surgeon: Thompson Grayer, MD;  Location: [facility];  Service: Cardiovascular;  Laterality: N/A;  . TONSILLECTOMY      Vitals:   01/18/21 0819  BP: 134/84  Pulse: 65  Subjective Assessment - 01/18/21 0816    Subjective Nothing new; no falls.  In the early mornings, my R leg is somewhat shaky.  Feel like where I was before the last stroke.    Pertinent History Recent hospitalization 2/4-11/21/20 (Dr. Rosalin Hawking and RN at d/c recommend OP PT)    Patient Stated Goals Pt's goals for therapy are to get back to where I was with strength and coordination.  11/22/20:  To get back where I was before this stroke.    Currently in Pain? No/denies                             [facility] Adult PT Treatment/Exercise - 01/18/21 0001      Transfers   Transfers Sit to Stand;Stand to Sit    Sit to Stand 7: Independent    Five time sit to stand comments  16.62    Stand to Sit 7: Independent      Ambulation/Gait   Ambulation/Gait Yes    Ambulation/Gait Assistance 5: Supervision    Assistive device None    Gait Pattern Step-through  pattern;Decreased hip/knee flexion - right    Ambulation Surface Level;Indoor    Gait velocity 17.59 sec =1.86 ft/sec      Standardized Balance Assessment   Standardized Balance Assessment Timed Up and Go Test;Berg Balance Test      Berg Balance Test   Sit to Stand Able to stand without using hands and stabilize independently    Standing Unsupported Able to stand safely 2 minutes    Sitting with Back Unsupported but Feet Supported on Floor or Stool Able to sit safely and securely 2 minutes    Stand to Sit Sits safely with minimal use of hands    Transfers Able to transfer safely, minor use of hands    Standing Unsupported with Eyes Closed Able to stand 10 seconds safely    Standing Ubsupported with Feet Together Able to place feet together independently but unable to hold for 30 seconds    From Standing, Reach Forward with Outstretched Arm Can reach confidently >25 cm (10")    From Standing Position, Pick up Object from Floor Able to pick up shoe safely and easily    From Standing Position, Turn to Look Behind Over each Shoulder Looks behind from both sides and weight shifts well    Turn 360 Degrees Able to turn 360 degrees safely but slowly    Standing Unsupported, Alternately Place Feet on Step/Stool Needs assistance to keep from falling or unable to try    Standing Unsupported, One Foot in Front Able to plae foot ahead of the other independently and hold 30 seconds    Standing on One Leg Unable to try or needs assist to prevent fall    Total Score 43      Timed Up and Go Test   TUG Normal TUG    Normal TUG (seconds) 13.9      Therapeutic Activites    Therapeutic Activities Other Therapeutic Activities    Other Therapeutic Activities Assessed BP measures at beginning of session (see vitals).  Reviewed pt's updates to HEP on 3/16, with pt return demo understanding of sidestepping, forward march, backwards walking at counter.  Cues to hold for support as needed.  Discussed progress  towards goals, POC and plans for d/c this visit.  Pt in agreement.  PT Short Term Goals - 12/22/20 0911      PT SHORT TERM GOAL #1   Title Pt will be independent with revised HEP for improved strength, balance, gait.  TARGET 12/17/2020    Baseline 12/13/20 Pt reports doing current HEP and walking daily.    Time 4    Period Weeks    Status Achieved      PT SHORT TERM GOAL #2   Title Pt will improve TUG score to less than or equal to 18 seconds for decreased fall risk.    Baseline TUG 10.39 sec 12/13/20    Time 4    Period Weeks    Status Achieved      PT SHORT TERM GOAL #3   Title Pt will improve Berg Balance score to at least 19/56 for decreased fall risk.    Baseline 46/56 12/13/20    Time 4    Period Weeks    Status Achieved      PT SHORT TERM GOAL #4   Title Pt will improve 5x sit<>stand to less than or equal to 18 seconds to demonstrate improved functional strength.    Baseline 14.45  12/13/20    Time 4    Status Achieved      PT SHORT TERM GOAL #5   Title Pt will verbalize understanding of fall prevention in home environment.    Baseline 12/21/20 met    Time 4    Period Weeks    Status Achieved   unable due to time constraints            PT Long Term Goals - 01/18/21 0818      PT LONG TERM GOAL #1   Title Pt will be independent with final progression of HEP for improved gait and balance.  TARGET 01/14/2021    Baseline per demonstration 01/18/2021    Time 8    Period Weeks    Status Achieved      PT LONG TERM GOAL #2   Title Pt will improve TUG score to less than or equal to 13.5 sec for decreased fall risk.    Baseline met per STG check 10.39 sec    Time 8    Period Weeks    Status Achieved      PT LONG TERM GOAL #3   Title Pt will improve Berg Balance score to at least 52/56 for decreased fall risk.    Baseline 46/56 at STG check; 43/56 at LTG check    Time 8    Period Weeks    Status Not Met      PT LONG TERM GOAL #4   Title  Pt will improve 5x sit<>stand to less than or equal to 13 sec for improved functional lower extremity strength.    Baseline 14.25 STG check; 16 sec at LTG check    Time 8    Period Weeks    Status Not Met      PT LONG TERM GOAL #5   Title Pt will improve gait velocity to at least 2 ft/sec for improved gait efficiency and decreased fall risk.    Baseline 0.96 ft/sec 11/22/20; 1.86 ft/sec    Time 8    Period Weeks    Status Not Met      PT LONG TERM GOAL #6   Title Pt will ambulate at least 1000 ft, indoors and outdoors surfaces, using least restrictive assistive device, mod independently, no LOB, for improved community ambulation.  Baseline 01/18/2021:  Unable to assess today due to time constraints with pt being late to PT session    Time 8    Period Weeks    Status Not Met                 Plan - 01/18/21 1451    Clinical Impression Statement Pt presents today for final PT visit, with pt meeting 2 of 5 LTGs.  He met LTG 1 and 2.  LTG 3, 4, 5 not met, with functional measures of gait velocity, Berg and 5x sit<>stand not progressing from last check.  LTG 6 not met for long distance gait, as pt arrived late for session; did not have time to assess.  Pt is using walker on outdoor surfaces, per his report.  He has made initial progress with PT, but he continues to demo RLE weakness and fall risk.  He has had limited progress towards functional goals in the past 4 weeks since STG check; discussed and educated pt's continued weakness and fall risk.  Pt is appropriate for and agreeable to d/c this visit.    Personal Factors and Comorbidities Comorbidity 3+    Comorbidities diabetes, HTN, morbid obesity, history of polysubstance abuse (cocaine and alcohol)    Examination-Activity Limitations Locomotion Level;Transfers;Stairs    Examination-Participation Restrictions Community Activity;Yard Work    Stability/Clinical Decision Making Evolving/Moderate complexity    Rehab Potential Good    PT  Frequency 2x / week    PT Duration 8 weeks   including week of recert, 12/19/1441   PT Treatment/Interventions ADLs/Self Care Home Management;Gait training;Stair training;Functional mobility training;Therapeutic activities;Therapeutic exercise;Balance training;Neuromuscular re-education;Patient/family education;DME Instruction    PT Next Visit Plan monitor vitials. Begin to address LTGs due on 01/14/21.    PT Home Exercise Plan Access Code: [email or web address]    Consulted and Agree with Plan of Care Patient           Patient will benefit from skilled therapeutic intervention in order to improve the following deficits and impairments:  Abnormal gait,Difficulty walking,Decreased balance,Decreased mobility  Visit Diagnosis: Other abnormalities of gait and mobility  Muscle weakness (generalized)  Unsteadiness on feet     Problem List Patient Active Problem List   Diagnosis Date Noted  . Dizziness 12/30/2020  . Cerebral thrombosis with cerebral infarction 11/20/2020  . Right sided weakness 11/19/2020  . Hyperkalemia 11/19/2020  . AKI (acute kidney injury) ([place])   . Dysarthria   . Amnesia 09/08/2020  . 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 Acute cerebrovascular accident (CVA) ([place]) 06/01/2020  . Paronychia of right thumb 05/10/2020  . Mediastinal adenopathy 01/06/2020  . Secondary hypercoagulable state ([place]) 10/22/2019  . Morbid obesity due to excess calories ([place]) 12/08/2016  . Paroxysmal atrial fibrillation ([place]) 01/23/2013  . Diabetes mellitus ([place]) 01/23/2013  . Hypertension 01/23/2013  . Tobacco user 01/23/2013  . Chronic diastolic heart failure ([place]) 01/21/2013  . 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 Shoulder pain, right 02/15/2011  . Seasonal and perennial allergic rhinitis 02/13/2008  . Dyslipidemia 02/12/2008  . Cocaine abuse ([place]) 02/12/2008  . Obstructive sleep apnea 02/12/2008    Emmogene Simson W. 01/18/2021, 2:55 PM  Frazier Butt., PT   [facility] [address] [place], [name], [address] Phone: [phone]   Fax:  [phone]  Name: [name]. MRN: [id number] Date of Birth: 1971/06/12

## 2021-01-19 DIAGNOSIS — Z0271 Encounter for disability determination: Secondary | ICD-10-CM

## 2021-01-23 NOTE — Progress Notes (Deleted)
HPI  male smoker followed for management of OSA and narcolepsy with cataplexy, complicated by allergic rhinitis, atrial fibrillation, dCHF, DM, HTN NPSG 11/23/00- AHI 20 per hour. Hypnagogic hallucination associated with dreaming. 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 Vivid dreams as soon as he falls asleep Multiple Sleep Latency Test 10/30/2012-pathologic daytime hypersomnia, nonspecific, compatible with idiopathic hypersomnia or narcolepsy. Mean latency 0.9 minutes with one sleep onset REM event CT chest 12/05/19- mediastinal and bilateral adenopathy, interstitial prominence, ASCVD --------------------------------------------------------------------------------------- .  10/25/20- 50 year old male Smoker followed for management of OSA and narcolepsy with cataplexy,, Mediastinal / Hilar Adenopathy, ILD,  complicated by allergic rhinitis, atrial fibrillation/Xarelto flecainide/ Xarelto, dCHF, Diastolic Dysfunction, CAD, CVA, DM2, HTN, Pancreatitis, Obesity, Tobacco use,  - Adderall XR 30 mg, 1 daily CPAP auto 4-20/ [employer]- compliance "0%", used 1 day since Dec 10. Body weight today- 312 lbs Covid vax- 2Phizer Flu vax- had -----Patient states that he is tired all the time, also states he had a stroke in August. States he wakes up several times a night Complains CPAP "smothers" him. Unclear if problem is pressure or mask, or both. Since CVA he wakes around MN, awake all night then drowsy all day. CVA affected speech and eyesight. 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  Smoking against advice. Easier DOE since CVA, but minimizes cough/ wheeze.  01/24/21-  50 year old male Smoker followed for management of OSA and narcolepsy with cataplexy,, Mediastinal / Hilar Adenopathy, ILD,  complicated by allergic rhinitis, atrial fibrillation/Xarelto flecainide/ Xarelto, dCHF, Diastolic Dysfunction, CAD, CVA, DM2, HTN, Pancreatitis, Obesity, Tobacco  use,  - Adderall XR 30 mg, 1 daily CPAP auto 4-20/ [employer]- Body weight today- Covid vax- Flu vax- CPAP titration was scheduled, not done   ROS-see HPI  + = positive Constitutional:   No-   weight loss, night sweats, fevers, +chills,  +fatigue, lassitude. HEENT:   No-  headaches, difficulty swallowing, tooth/dental problems, sore throat,       No-  sneezing, itching, ear ache, + nasal congestion, post nasal drip,  CV:  No-   chest pain, orthopnea, PND, swelling in lower extremities, anasarca, dizziness, palpitations Resp: + shortness of breath with exertion or at rest.              No-   productive cough,  No non-productive cough,  No- coughing up of blood.              No-   change in color of mucus.  No- wheezing.   Skin: No-   rash or lesions. GI:  No-   heartburn, indigestion, abdominal pain, nausea, vomiting,  GU:  MS:  No-   joint pain or swelling.  Neuro-     +HPI Psych:  No- change in mood or affect. No depression or anxiety.  No memory loss.  OBJ- Physical Exam   General- Alert/ calm, Oriented, Affect-appropriate, Distress- none acute, +morbidly obese, +drowsy Skin- rash-none, lesions- none, excoriation- none Lymphadenopathy- none Head- atraumatic            Eyes- Gross vision intact, PERRLA, conjunctivae and secretions clear            Ears- Hearing, canals-normal            Nose- rhinitis/ turbinate edema, no-Septal dev, mucus, polyps, erosion, perforation             Throat- Mallampati III , mucosa clear , drainage- none, tonsils- atrophic Neck- flexible , trachea  midline, no stridor , thyroid nl, carotid no bruit Chest - symmetrical excursion , unlabored           Heart/CV- RRR today, no murmur , no gallop  , no rub, nl s1 s2                           - JVD- none , edema- none, stasis changes- none, varices- none           Lung- clear to P&A, wheeze- none, cough- none , dullness-none, rub- none           Chest wall-  Abd-  Br/ Gen/ Rectal- Not done, not  indicated Extrem-

## 2021-01-24 ENCOUNTER — Telehealth: Payer: Self-pay | Admitting: Internal Medicine

## 2021-01-24 ENCOUNTER — Ambulatory Visit: Payer: 59 | Admitting: Internal Medicine

## 2021-01-24 MED ORDER — AMPHETAMINE-DEXTROAMPHET ER 30 MG PO CP24: 30.0000 mg | ORAL_CAPSULE | Freq: Every day | ORAL | 0 refills | Status: DC | PRN

## 2021-01-24 NOTE — Telephone Encounter (Signed)
Called and spoke with patient, advised I would send message to Dr. Maple Hudson that he needs refill on his Adderall sent to [facility] on [address]  He verbalized understanding.  Dr. Maple Hudson, Patient called regarding refill of Adderall.  Please advise.  Thank you.

## 2021-01-24 NOTE — Telephone Encounter (Signed)
Adderall refilled  Please remind him that he missed an appointment with me today. Please reschedule for next available routine slot.

## 2021-01-24 NOTE — Telephone Encounter (Signed)
Called and spoke with pt and he is aware of rx has been sent to the pharmacy.   Pt did rsch his appt to 05/17 at 930

## 2021-02-01 ENCOUNTER — Other Ambulatory Visit: Payer: Self-pay | Admitting: Student

## 2021-02-02 ENCOUNTER — Ambulatory Visit (INDEPENDENT_AMBULATORY_CARE_PROVIDER_SITE_OTHER): Payer: 59 | Admitting: Podiatrist

## 2021-02-02 ENCOUNTER — Other Ambulatory Visit: Payer: Self-pay

## 2021-02-02 DIAGNOSIS — E119 Type 2 diabetes mellitus without complications: Secondary | ICD-10-CM

## 2021-02-02 DIAGNOSIS — M79609 Pain in unspecified limb: Secondary | ICD-10-CM | POA: Diagnosis not present

## 2021-02-02 DIAGNOSIS — G608 Other hereditary and idiopathic neuropathies: Secondary | ICD-10-CM

## 2021-02-02 DIAGNOSIS — B351 Tinea unguium: Secondary | ICD-10-CM | POA: Diagnosis not present

## 2021-02-02 NOTE — Patient Instructions (Signed)

## 2021-02-03 ENCOUNTER — Encounter: Payer: 59 | Admitting: Internal Medicine

## 2021-02-09 ENCOUNTER — Encounter: Payer: Self-pay | Admitting: Podiatrist

## 2021-02-09 NOTE — Progress Notes (Signed)
Subjective: [name]. presents today for diabetic foot evaluation, at risk foot care with history of peripheral neuropathy and long term blood thinner, eliquis, and presents today with painful, discolored, thick toenails which interfere with daily activities. He reports his last A1C was 7.7.  He would like to reestablish preventive foot care on a regular basis.   Douglas Dame, MD is patient's PCP. Last visit was:12/29/2020  Past Medical History:  Diagnosis Date  . Acute pancreatitis 01/22/2019  . Diabetes mellitus   . GERD (gastroesophageal reflux disease)   . History of alcohol abuse   . History of cocaine use   . History of echocardiogram    a. Echo 4/14: Moderate LVH, vigorous LVEF, EF 65-70%, normal wall motion, grade 2 diastolic dysfunction, mildly dilated aortic root and ascending aorta, ascending aorta 40 mm, aortic root 38 mm, mild LAE  . Hx of cardiovascular stress test    a. GXT 5/14: No ischemic changes  //  b. ETT-Myoview 3/16:  Low risk, no ischemia, EF 58%  . Hyperlipidemia   . Hypertension   . Morbid obesity ([place])   . Paroxysmal atrial fibrillation ([place])    Occurring in 2008, with several recurrence since then (including in the setting of + cocaine on UDS).  . Sleep apnea    uses CPAP  . SVT (supraventricular tachycardia) ([place])    mid to long RP SVT 09/2019   Outpatient Encounter Medications as of 02/02/2021  Medication Sig  . amLODipine (NORVASC) 5 MG tablet   . sodium polystyrene (KAYEXALATE) powder   . amphetamine-dextroamphetamine (ADDERALL XR) 30 MG 24 hr capsule Take 1 capsule (30 mg total) by mouth daily as needed (focus). 1 daily as needed  . apixaban (ELIQUIS) 5 MG TABS tablet Take 1 tablet (5 mg total) by mouth 2 (two) times daily.  Marland Kitchen aspirin EC 81 MG EC tablet Take 1 tablet (81 mg total) by mouth daily. Swallow whole.  Marland Kitchen atorvastatin (LIPITOR) 80 MG tablet Take 1 tablet (80 mg total) by mouth daily.  . blood glucose meter kit and supplies  KIT Dispense based on patient and insurance preference. Use up to four times daily as directed.  . Blood Pressure Monitoring (BLOOD PRESSURE MONITOR/L CUFF) MISC 1 Units by Does not apply route daily.  Marland Kitchen diltiazem (CARDIZEM) 30 MG tablet   . flecainide (TAMBOCOR) 100 MG tablet Take 1 tablet (100 mg total) by mouth 2 (two) times daily.  Marland Kitchen glipiZIDE (GLUCOTROL) 10 MG tablet Take 10 mg by mouth 2 (two) times daily before a meal.  . hydrochlorothiazide (MICROZIDE) 12.5 MG capsule TAKE 2 CAPSULES BY MOUTH DAILY.  Marland Kitchen lactose free nutrition (BOOST) LIQD Take 237 mLs by mouth daily.  Marland Kitchen lisinopril (ZESTRIL) 40 MG tablet Take 40 mg by mouth daily.  . metFORMIN (GLUCOPHAGE) 500 MG tablet Take 1,000 mg by mouth 2 (two) times daily with a meal.   . metoprolol succinate (TOPROL XL) 100 MG 24 hr tablet Take 1 tablet (100 mg total) by mouth daily. Take with or immediately following a meal.  . nicotine (NICODERM CQ - DOSED IN MG/24 HOURS) 21 mg/24hr patch PLACE 1 PATCH (21 MG TOTAL) ONTO THE SKIN DAILY.  Marland Kitchen Olmesartan-amLODIPine-HCTZ 40-5-25 MG TABS Take 1 tablet by mouth daily.  . pantoprazole (PROTONIX) 40 MG tablet Take 1 tablet (40 mg total) by mouth daily.  . rivaroxaban (XARELTO) 20 MG TABS tablet   . spironolactone (ALDACTONE) 25 MG tablet Take 1 tablet by mouth daily.  . TRULICITY  0.75 MG/0.5ML SOPN INJECT 0.75 MG INTO THE SKIN ONCE A WEEK.   No facility-administered encounter medications on file as of 02/02/2021.   Allergies  Allergen Reactions  . Shrimp [Shellfish Allergy] Anaphylaxis  . Banana Other (See Comments)    Pt gags  . Watermelon Flavor Nausea And Vomiting  . Other Itching    Grapes cause itching  . Almond Oil Itching    Roof of mouth itches  . Peanut-Containing Drug Products Itching and Cough  . Sulfa Antibiotics Itching     Objective: [name]. is a pleasant 50 y.o. y.o. male in NAD. AAO x 3.   Vascular Examination: Capillary refill time to digits immediate  b/l. Palpable pedal pulses b/l LE. Pedal hair present. Lower extremity skin temperature gradient within normal limits.  Dermatological Examination: Pedal skin with normal turgor, texture and tone bilaterally. No open wounds bilaterally. No interdigital macerations bilaterally. Toenails 1-5 b/l elongated, discolored, dystrophic, thickened, crumbly with subungual debris and tenderness to dorsal palpation. No hyperkeratotic nor porokeratotic lesions present on today's visit.  Musculoskeletal: Normal muscle strength 5/5 to all lower extremity muscle groups bilaterally. No pain crepitus or joint limitation noted with ROM b/l. Hallux valgus with bunion deformity noted right lower extremity. Pes planus deformity noted b/l.   Neurological Examination: Protective sensation decreased with 10 gram monofilament b/l. Vibratory sensation decreased b/l. Proprioception intact bilaterally.  Assessment:   ICD-10-CM   1. Encounter for diabetic foot exam ([place])  E11.9   2. Pain due to onychomycosis of nail  B35.1    M79.609   3. Sensory peripheral neuropathy  G60.8     Plan: -Examined patient. -Patient to continue soft, supportive shoe gear daily. -Toenails 1-5 b/l were debrided in length and girth with sterile nail nippers and dremel without iatrogenic bleeding.  -Patient to report any pedal injuries to medical professional immediately. -Patient/POA to call should there be question/concern in the interim.  Patient to be seen back in 3 months for continued care.  He will call to make an appontment at his convenience.

## 2021-02-17 ENCOUNTER — Encounter: Payer: 59 | Admitting: Student

## 2021-02-17 NOTE — Progress Notes (Deleted)
This is a Careers information officer Note.  The care of the patient was discussed with Dr. Johnney Ou and the assessment and plan was formulated with their assistance.  Please see their note for official documentation of the patient encounter.   Subjective:   Patient ID: [name]. male   DOB: Feb 23, 1971 50 y.o.   MRN: [id number]  HPI: [name]. is a 50 y.o. male with a past medical history of diabetes, hypertension, hyperlipidemia, diastolic heart failure, CVA, atrial fibrillation, SVT, sleep apnea, and cocaine use who presents today for a routine follow up.    Diastolic HF - Echo in Feb 2022: LVEF of 60-65%, mildly dilated LA, mild mitral regurgitation and calcification, dilation of the ascending aorta of 26m - Olmesartan-amlodipine-hydrochlorothiazide, metoprolol  HTN - Olmesartan-amlodipine-hydrochlorothiazide, metoprolol  Atrial fibrillation         Past Medical History:  Diagnosis Date  . Acute pancreatitis 01/22/2019  . Diabetes mellitus   . GERD (gastroesophageal reflux disease)   . History of alcohol abuse   . History of cocaine use   . History of echocardiogram    a. Echo 4/14: Moderate LVH, vigorous LVEF, EF 65-70%, normal wall motion, grade 2 diastolic dysfunction, mildly dilated aortic root and ascending aorta, ascending aorta 40 mm, aortic root 38 mm, mild LAE  . Hx of cardiovascular stress test    a. GXT 5/14: No ischemic changes  //  b. ETT-Myoview 3/16:  Low risk, no ischemia, EF 58%  . Hyperlipidemia   . Hypertension   . Morbid obesity ([place]   . Paroxysmal atrial fibrillation ([place]    Occurring in 2008, with several recurrence since then (including in the setting of + cocaine on UDS).  . Sleep apnea    uses CPAP  . SVT (supraventricular tachycardia) ([place]    mid to long RP SVT 09/2019   Current Outpatient Medications  Medication Sig Dispense Refill  . Accu-Chek Softclix Lancets lancets TEST UP TO 4 TIMES A DAY 100 each 2  .  amLODipine (NORVASC) 5 MG tablet     . amphetamine-dextroamphetamine (ADDERALL XR) 30 MG 24 hr capsule Take 1 capsule (30 mg total) by mouth daily as needed (focus). 1 daily as needed 30 capsule 0  . apixaban (ELIQUIS) 5 MG TABS tablet Take 1 tablet (5 mg total) by mouth 2 (two) times daily. 60 tablet 0  . aspirin EC 81 MG EC tablet Take 1 tablet (81 mg total) by mouth daily. Swallow whole. 30 tablet 11  . atorvastatin (LIPITOR) 80 MG tablet Take 1 tablet (80 mg total) by mouth daily. 30 tablet 0  . blood glucose meter kit and supplies KIT Dispense based on patient and insurance preference. Use up to four times daily as directed. 1 each 0  . Blood Pressure Monitoring (BLOOD PRESSURE MONITOR/L CUFF) MISC 1 Units by Does not apply route daily. 1 each 0  . diltiazem (CARDIZEM) 30 MG tablet     . flecainide (TAMBOCOR) 100 MG tablet Take 1 tablet (100 mg total) by mouth 2 (two) times daily. 180 tablet 3  . glipiZIDE (GLUCOTROL) 10 MG tablet Take 10 mg by mouth 2 (two) times daily before a meal.    . hydrochlorothiazide (MICROZIDE) 12.5 MG capsule TAKE 2 CAPSULES BY MOUTH DAILY. 30 capsule 0  . lactose free nutrition (BOOST) LIQD Take 237 mLs by mouth daily.    .Marland Kitchenlisinopril (ZESTRIL) 40 MG tablet Take 40 mg by mouth daily.    . metFORMIN (  GLUCOPHAGE) 500 MG tablet Take 1,000 mg by mouth 2 (two) times daily with a meal.     . metoprolol succinate (TOPROL XL) 100 MG 24 hr tablet Take 1 tablet (100 mg total) by mouth daily. Take with or immediately following a meal. 30 tablet 11  . nicotine (NICODERM CQ - DOSED IN MG/24 HOURS) 21 mg/24hr patch PLACE 1 PATCH (21 MG TOTAL) ONTO THE SKIN DAILY. 28 patch 1  . Olmesartan-amLODIPine-HCTZ 40-5-25 MG TABS Take 1 tablet by mouth daily. 30 tablet 0  . pantoprazole (PROTONIX) 40 MG tablet Take 1 tablet (40 mg total) by mouth daily. 30 tablet 5  . rivaroxaban (XARELTO) 20 MG TABS tablet     . sodium polystyrene (KAYEXALATE) powder     . spironolactone (ALDACTONE) 25  MG tablet Take 1 tablet by mouth daily.    . TRULICITY 6.38 GT/3.6IW SOPN INJECT 0.75 MG INTO THE SKIN ONCE A WEEK. 6 mL 1   No current facility-administered medications for this visit.   Family History  Problem Relation Age of Onset  . Diabetes Mother   . Heart attack Father 67  . Stroke Maternal Grandmother   . Stroke Paternal Grandmother   . Diabetes Paternal Grandfather    Social History   Socioeconomic History  . Marital status: Single    Spouse name: Not on file  . Number of children: 0  . Years of education: 69  . Highest education level: Not on file  Occupational History  . Occupation: Merchandiser, retail: UNEMPLOYED  Tobacco Use  . Smoking status: Current Every Day Smoker    Packs/day: 0.50    Years: 28.00    Pack years: 14.00    Types: Cigarettes  . Smokeless tobacco: Former Systems developer    Types: Snuff  Vaping Use  . Vaping Use: Never used  Substance and Sexual Activity  . Alcohol use: Not Currently    Alcohol/week: 6.0 standard drinks    Types: 6 Standard drinks or equivalent per week    Comment: monthly  . Drug use: Not Currently    Comment: cocaine in the past, none currently  . Sexual activity: Not on file  Other Topics Concern  . Not on file  Social History Narrative   Fun: Designer, fashion/clothing       Lives in [place] with sister.      Unemployed, was working a Land job   Social Determinants of Sales executive: Not on file  Food Insecurity: Not on file  Transportation Needs: Not on file  Physical Activity: Not on file  Stress: Not on file  Social Connections: Not on file   Review of Systems: {Review Of Systems:30496} Objective:  Physical Exam: There were no vitals filed for this visit. {Exam, Complete:17964} Assessment & Plan:  Please see problem based charting for more details

## 2021-02-19 ENCOUNTER — Other Ambulatory Visit: Payer: Self-pay | Admitting: Internal Medicine

## 2021-02-19 ENCOUNTER — Other Ambulatory Visit: Payer: Self-pay | Admitting: Student

## 2021-02-19 DIAGNOSIS — I10 Essential (primary) hypertension: Secondary | ICD-10-CM

## 2021-02-21 ENCOUNTER — Other Ambulatory Visit: Payer: Self-pay | Admitting: Student

## 2021-02-21 DIAGNOSIS — I480 Paroxysmal atrial fibrillation: Secondary | ICD-10-CM

## 2021-02-21 NOTE — Telephone Encounter (Signed)
Patient is in need of an OV for HTN evaluation.

## 2021-02-23 ENCOUNTER — Other Ambulatory Visit: Payer: Self-pay

## 2021-02-23 ENCOUNTER — Encounter: Payer: Self-pay | Admitting: Student

## 2021-02-23 ENCOUNTER — Ambulatory Visit (INDEPENDENT_AMBULATORY_CARE_PROVIDER_SITE_OTHER): Payer: 59 | Admitting: Student

## 2021-02-23 ENCOUNTER — Encounter: Payer: Self-pay | Admitting: Dietician

## 2021-02-23 ENCOUNTER — Ambulatory Visit: Payer: 59 | Admitting: Dietician

## 2021-02-23 VITALS — BP 143/128 | HR 73 | Temp 97.6°F | Ht 69.0 in | Wt 311.9 lb

## 2021-02-23 DIAGNOSIS — E1165 Type 2 diabetes mellitus with hyperglycemia: Secondary | ICD-10-CM | POA: Diagnosis not present

## 2021-02-23 DIAGNOSIS — I10 Essential (primary) hypertension: Secondary | ICD-10-CM

## 2021-02-23 DIAGNOSIS — E785 Hyperlipidemia, unspecified: Secondary | ICD-10-CM

## 2021-02-23 DIAGNOSIS — Z0000 Encounter for general adult medical examination without abnormal findings: Secondary | ICD-10-CM | POA: Insufficient documentation

## 2021-02-23 DIAGNOSIS — I480 Paroxysmal atrial fibrillation: Secondary | ICD-10-CM

## 2021-02-23 DIAGNOSIS — G4733 Obstructive sleep apnea (adult) (pediatric): Secondary | ICD-10-CM

## 2021-02-23 LAB — POCT GLYCOSYLATED HEMOGLOBIN (HGB A1C): Hemoglobin A1C: 8.3 % — AB (ref 4.0–5.6)

## 2021-02-23 LAB — GLUCOSE, CAPILLARY: Glucose-Capillary: 72 mg/dL (ref 70–99)

## 2021-02-23 MED ORDER — METOPROLOL SUCCINATE ER 100 MG PO TB24: 100.0000 mg | ORAL_TABLET | Freq: Every day | ORAL | 11 refills | Status: DC

## 2021-02-23 MED ORDER — OLMESARTAN-AMLODIPINE-HCTZ 40-5-25 MG PO TABS: 1.0000 | ORAL_TABLET | Freq: Every day | ORAL | 3 refills | Status: DC

## 2021-02-23 MED ORDER — ATORVASTATIN CALCIUM 80 MG PO TABS: 80.0000 mg | ORAL_TABLET | Freq: Every day | ORAL | 3 refills | Status: DC

## 2021-02-23 MED ORDER — ELIQUIS 5 MG PO TABS: 1.0000 | ORAL_TABLET | Freq: Two times a day (BID) | ORAL | 3 refills | Status: DC

## 2021-02-23 MED ORDER — TRULICITY 1.5 MG/0.5ML ~~LOC~~ SOAJ: 1.5000 mg | SUBCUTANEOUS | 5 refills | Status: DC

## 2021-02-23 MED ORDER — OLMESARTAN-AMLODIPINE-HCTZ 40-10-25 MG PO TABS: 1.0000 | ORAL_TABLET | Freq: Every day | ORAL | 3 refills | Status: DC

## 2021-02-23 NOTE — Progress Notes (Signed)
CC: Hyperglycemia, high blood pressure  HPI:  [name] is a 50 y.o. male with a past medical history stated below and presents today for hyperglycemia, high blood pressure. Please see problem based assessment and plan for additional details.  Past Medical History:  Diagnosis Date  . Acute pancreatitis 01/22/2019  . Diabetes mellitus   . GERD (gastroesophageal reflux disease)   . History of alcohol abuse   . History of cocaine use   . History of echocardiogram    a. Echo 4/14: Moderate LVH, vigorous LVEF, EF 65-70%, normal wall motion, grade 2 diastolic dysfunction, mildly dilated aortic root and ascending aorta, ascending aorta 40 mm, aortic root 38 mm, mild LAE  . Hx of cardiovascular stress test    a. GXT 5/14: No ischemic changes  //  b. ETT-Myoview 3/16:  Low risk, no ischemia, EF 58%  . Hyperlipidemia   . Hypertension   . Morbid obesity ([place])   . Paroxysmal atrial fibrillation ([place])    Occurring in 2008, with several recurrence since then (including in the setting of + cocaine on UDS).  . Sleep apnea    uses CPAP  . SVT (supraventricular tachycardia) ([place])    mid to long RP SVT 09/2019    Current Outpatient Medications on File Prior to Visit  Medication Sig Dispense Refill  . Accu-Chek Softclix Lancets lancets TEST UP TO 4 TIMES A DAY 100 each 2  . amphetamine-dextroamphetamine (ADDERALL XR) 30 MG 24 hr capsule Take 1 capsule (30 mg total) by mouth daily as needed (focus). 1 daily as needed 30 capsule 0  . aspirin EC 81 MG EC tablet Take 1 tablet (81 mg total) by mouth daily. Swallow whole. 30 tablet 11  . blood glucose meter kit and supplies KIT Dispense based on patient and insurance preference. Use up to four times daily as directed. 1 each 0  . Blood Pressure Monitoring (BLOOD PRESSURE MONITOR/L CUFF) MISC 1 Units by Does not apply route daily. 1 each 0  . diltiazem (CARDIZEM) 30 MG tablet     . flecainide (TAMBOCOR) 100 MG tablet Take 1 tablet (100  mg total) by mouth 2 (two) times daily. 180 tablet 3  . lactose free nutrition (BOOST) LIQD Take 237 mLs by mouth daily.    . metFORMIN (GLUCOPHAGE) 500 MG tablet Take 1,000 mg by mouth 2 (two) times daily with a meal.     . nicotine (NICODERM CQ - DOSED IN MG/24 HOURS) 21 mg/24hr patch PLACE 1 PATCH (21 MG TOTAL) ONTO THE SKIN DAILY. 28 patch 1  . pantoprazole (PROTONIX) 40 MG tablet Take 1 tablet (40 mg total) by mouth daily. 30 tablet 5  . sodium polystyrene (KAYEXALATE) powder     . TRULICITY 8.03 OZ/2.2QM SOPN INJECT 0.75 MG INTO THE SKIN ONCE A WEEK. 6 mL 1   No current facility-administered medications on file prior to visit.    Family History  Problem Relation Age of Onset  . Diabetes Mother   . Heart attack Father 45  . Stroke Maternal Grandmother   . Stroke Paternal Grandmother   . Diabetes Paternal Grandfather     Social History   Socioeconomic History  . Marital status: Single    Spouse name: Not on file  . Number of children: 0  . Years of education: 30  . Highest education level: Not on file  Occupational History  . Occupation: Merchandiser, retail: UNEMPLOYED  Tobacco Use  . Smoking status:  Current Every Day Smoker    Packs/day: 0.50    Years: 28.00    Pack years: 14.00    Types: Cigarettes  . Smokeless tobacco: Former Systems developer    Types: Snuff  Vaping Use  . Vaping Use: Never used  Substance and Sexual Activity  . Alcohol use: Not Currently    Alcohol/week: 6.0 standard drinks    Types: 6 Standard drinks or equivalent per week    Comment: monthly  . Drug use: Not Currently    Comment: cocaine in the past, none currently  . Sexual activity: Not on file  Other Topics Concern  . Not on file  Social History Narrative   Fun: Designer, fashion/clothing       Lives in [place] with sister.      Unemployed, was working a Land job   Social Determinants of Sales executive: Not on file  Food Insecurity: Not on file  Transportation Needs: Not  on file  Physical Activity: Not on file  Stress: Not on file  Social Connections: Not on file  Intimate Partner Violence: Not on file    Review of Systems: ROS negative except for what is noted on the assessment and plan.  Vitals:   02/23/21 1010 02/23/21 1012  BP: (!) 156/90 (!) 143/128  Pulse: 75 73  Temp: 97.6 F (36.4 C)   TempSrc: Oral   SpO2: 99%   Weight: (!) 311 lb 14.4 oz (141.5 kg)   Height: $Remove'5\' 9"'GrKIdME$  (1.753 m)      Physical Exam: Constitutional: well-appearing, sitting in chair comfortably, no distress distress HENT: normocephalic atraumatic Eyes: conjunctiva non-erythematous Neck: supple Cardiovascular: regular rate and rhythm, no m/r/g Pulmonary/Chest: normal work of breathing on room air Abdominal: soft, non-tender, non-distended MSK: normal bulk and tone Neurological: alert & oriented x 3 Skin: warm and dry Psych: Normal mood and thought process   Assessment & Plan:   See Encounters Tab for problem based charting.  Patient discussed with Dr. Newell Coral, D.O. [facility], PGY-1 Pager: [phone], Phone: [phone] Date 02/23/2021 Time 4:46 PM

## 2021-02-23 NOTE — Progress Notes (Signed)
Diabetes Self Management Education & Support Asked to assist patient in learning how to use his Accu chek guide meter. He brought the meter and supplies to the office,but has not been checking because he did not know how.  Instructed him on how to use it, he verbalized understanding and was able to provide teach back.  [name] was also educated about Continuous glucose monitoring, but it was decided this was not needed today. Patient's care was discussed with Dr. Ladora Daniel. Norm Parcel, RD 02/23/2021 1:38 PM.

## 2021-02-23 NOTE — Patient Instructions (Addendum)
Thank you, Mr.Earley Elimelech Houseman. for allowing [place] to provide your care today. Today we discussed.  Diabetes We will be putting on a continuous glucose monitor.  This will allow [place] to check her sugars more frequently and be able to track them better.  We will be discontinued glipizide.  And increasing her Trulicity.  High blood pressure Please continue your olmesartan-amlodipine,-HCTZ medicine.  We will be increasing the dose of this medicine.  Please stop taking the amlodipine by itself as well as the lisinopril and spironolactone.  I have ordered the following labs for you:   Lab Orders     Glucose, capillary     POC Hbg A1C    Referrals ordered today:    Referral Orders     Ambulatory referral to Gastroenterology   I have ordered the following medication/changed the following medications:   Stop the following medications: Medications Discontinued During This Encounter  Medication Reason  . spironolactone (ALDACTONE) 25 MG tablet   . metoprolol succinate (TOPROL XL) 100 MG 24 hr tablet Reorder  . atorvastatin (LIPITOR) 80 MG tablet Reorder  . Olmesartan-amLODIPine-HCTZ 40-5-25 MG TABS Reorder  . ELIQUIS 5 MG TABS tablet Reorder     Start the following medications: Meds ordered this encounter  Medications  . Olmesartan-amLODIPine-HCTZ 40-5-25 MG TABS    Sig: Take 1 tablet by mouth daily.    Dispense:  90 tablet    Refill:  3  . apixaban (ELIQUIS) 5 MG TABS tablet    Sig: Take 1 tablet (5 mg total) by mouth 2 (two) times daily.    Dispense:  90 tablet    Refill:  3  . atorvastatin (LIPITOR) 80 MG tablet    Sig: Take 1 tablet (80 mg total) by mouth daily.    Dispense:  90 tablet    Refill:  3  . metoprolol succinate (TOPROL XL) 100 MG 24 hr tablet    Sig: Take 1 tablet (100 mg total) by mouth daily. Take with or immediately following a meal.    Dispense:  30 tablet    Refill:  11     Follow up: 1 month    Remember: If you feel as though your sugars are  dropping low frequently (below 80) please check your sugar and call our office to be seen  Should you have any questions or concerns please call the internal medicine clinic at [phone].     Thalia Bloodgood, D.O. [facility]

## 2021-02-23 NOTE — Assessment & Plan Note (Signed)
Referral placed to GI for colonoscopy.

## 2021-02-23 NOTE — Assessment & Plan Note (Addendum)
Assessment: Patient notes history of OSA, he has been able to wear his CPAP because it feels suffocating.  Encouraged him to continue wearing it.  Discussed with him that this may be why he has persistent hypertension, patient knowledges understanding.   Plan: -Encourage patient to use CPAP

## 2021-02-23 NOTE — Assessment & Plan Note (Signed)
Assessment: Patient with history of uncontrolled diabetes.  Current regimen is metformin, glipizide, Trulicity.  Patient does endorse multiple episodes of low blood sugars.  He is only able to feel them if they are into the 40s which occurred a few times.  Patient notes that these new glucometer was difficulties he was unable to record his readings.  Because of his multiple low blood sugars, we will plan to discontinue glipizide and increase the Trulicity.  This will help with glycemic control but also weight loss which will overall improve his general health.  Plan: -Continue glipizide, increase Trulicity to 1.5 mg weekly. -Continue metformin 1000 mg twice daily -Repeat A1c of 8.3

## 2021-02-23 NOTE — Assessment & Plan Note (Signed)
Assessment: Blood pressure of 143/128.  Patient has history of nonadherence to medications.  Dr. Marijo Conception did an excellent job with reviewing all of patient's medications during his last visit as well as a marking through the ones that he did not with the patient to take.  States that they have not been refilling his olmesartan-amlodipine-HCTZ and have been giving him refills on lisinopril.  Discussed with him that this medication was discontinued and that he should only be on the olmesartan-amlodipine-HCTZ.  I requested the patient leave the medications here and they were disposed of safely by our clinic staff.  Because of the patient's persistent hypertension we will increase dose of his amlodipine and his combination pill.  Plan: -Continue olmesartan-amlodipine-HCTZ 40-10-20 5 mg -Encourage patient to take home blood pressure readings -repeat blood pressure in 1 month

## 2021-02-24 ENCOUNTER — Telehealth: Payer: Self-pay | Admitting: Internal Medicine

## 2021-02-24 MED ORDER — AMPHETAMINE-DEXTROAMPHET ER 30 MG PO CP24: 30.0000 mg | ORAL_CAPSULE | Freq: Every day | ORAL | 0 refills | Status: DC | PRN

## 2021-02-24 NOTE — Telephone Encounter (Signed)
Seen in clinic on 05/11

## 2021-02-24 NOTE — Telephone Encounter (Signed)
Called and spoke with patient who needs refill on his adderall. Pharmacy is [facility] on [address].    Dr. Maple Hudson please advise

## 2021-02-24 NOTE — Telephone Encounter (Signed)
Spoke with pt and notified him that Rx refill was place. Pt stated understanding. Nothing further needed at this time.

## 2021-02-24 NOTE — Telephone Encounter (Signed)
Adderall refilled

## 2021-02-27 NOTE — Progress Notes (Signed)
Internal Medicine Clinic Attending  Case discussed with Dr. Katsadouros  At the time of the visit.  We reviewed the resident's history and exam and pertinent patient test results.  I agree with the assessment, diagnosis, and plan of care documented in the resident's note.  

## 2021-02-28 NOTE — Progress Notes (Signed)
HPI  male smoker followed for management of OSA and narcolepsy with cataplexy, complicated by allergic rhinitis, atrial fibrillation, dCHF, DM, HTN NPSG 11/23/00- AHI 20 per hour. Hypnagogic hallucination associated with dreaming. 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 Vivid dreams as soon as he falls asleep Multiple Sleep Latency Test 10/30/2012-pathologic daytime hypersomnia, nonspecific, compatible with idiopathic hypersomnia or narcolepsy. Mean latency 0.9 minutes with one sleep onset REM event CT chest 12/05/19- mediastinal and bilateral adenopathy, interstitial prominence, ASCVD --------------------------------------------------------------------------------------- .  10/25/20- 50 year old male Smoker followed for management of OSA and narcolepsy with cataplexy,, Mediastinal / Hilar Adenopathy, ILD,  complicated by allergic rhinitis, atrial fibrillation/Xarelto flecainide/ Xarelto, dCHF, Diastolic Dysfunction, CAD, CVA, DM2, HTN, Pancreatitis, Obesity, Tobacco use,  - Adderall XR 30 mg, 1 daily CPAP auto 4-20/ [employer]- compliance "0%", used 1 day since Dec 10. Body weight today- 312 lbs Covid vax- 2Phizer Flu vax- had -----Patient states that he is tired all the time, also states he had a stroke in August. States he wakes up several times a night Complains CPAP "smothers" him. Unclear if problem is pressure or mask, or both. Since CVA he wakes around MN, awake all night then drowsy all day. CVA affected speech and eyesight. 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  Smoking against advice. Easier DOE since CVA, but minimizes cough/ wheeze.  03/01/21- 50 year old male Smoker (14 pkyrs)  followed for management of OSA and narcolepsy with cataplexy,, Mediastinal / Hilar Adenopathy, ILD,  complicated by allergic rhinitis, atrial fibrillation/Eliquis flecainide/ Xarelto, dCHF, Diastolic Dysfunction, CAD, CVA, DM2, HTN, Pancreatitis,  Obesity, Tobacco use,  - Adderall XR 30 mg, 1 daily CPAP auto 4-20/ [employer]-   Body weight today- 306 lbs Covid vax- 3Phizer -----Winded easily, stroke in Feb 2022, fatigued easily, not wearing CPAP Still smoking 1/2 ppd.  Hasn't been able to tolerate CPAP. Sleeps poorly at night. Fights daytime sleepiness. More intrusive Cataplexy, triggered by startle, laughter. Adderall doesn't last past midmorning. Used Benedetto Goad to get here today.  ROS-see HPI  + = positive Constitutional:   No-   weight loss, night sweats, fevers, +chills,  +fatigue, lassitude. HEENT:   No-  headaches, difficulty swallowing, tooth/dental problems, sore throat,       No-  sneezing, itching, ear ache, + nasal congestion, post nasal drip,  CV:  No-   chest pain, orthopnea, PND, swelling in lower extremities, anasarca, dizziness, palpitations Resp: + shortness of breath with exertion or at rest.              No-   productive cough,  No non-productive cough,  No- coughing up of blood.              No-   change in color of mucus.  No- wheezing.   Skin: No-   rash or lesions. GI:  No-   heartburn, indigestion, abdominal pain, nausea, vomiting,  GU:  MS:  No-   joint pain or swelling.  Neuro-     +HPI Psych:  No- change in mood or affect. No depression or anxiety.  No memory loss.  OBJ- Physical Exam   General- Alert/ calm, Oriented, Affect-appropriate, Distress- none acute, +morbidly obese, +extremey drowsy initially with me in room, then woke up and became much more interactive. with appropriate questions.  Skin- rash-none, lesions- none, excoriation- none Lymphadenopathy- none Head- atraumatic            Eyes- Gross vision intact, PERRLA, conjunctivae and secretions  clear            Ears- Hearing, canals-normal            Nose- rhinitis/ turbinate edema, no-Septal dev, mucus, polyps, erosion, perforation             Throat- Mallampati III , mucosa clear , drainage- none, tonsils- atrophic Neck- flexible ,  trachea midline, no stridor , thyroid nl, carotid no bruit Chest - symmetrical excursion , unlabored           Heart/CV- RRR today, no murmur , no gallop  , no rub, nl s1 s2                           - JVD- none , edema- none, stasis changes- none, varices- none           Lung- clear to P&A, wheeze- none, cough- none , dullness-none, rub- none           Chest wall-  Abd-  Br/ Gen/ Rectal- Not done, not indicated Extrem- no edema

## 2021-03-01 ENCOUNTER — Ambulatory Visit (INDEPENDENT_AMBULATORY_CARE_PROVIDER_SITE_OTHER): Payer: 59 | Admitting: Internal Medicine

## 2021-03-01 ENCOUNTER — Ambulatory Visit (INDEPENDENT_AMBULATORY_CARE_PROVIDER_SITE_OTHER): Payer: 59

## 2021-03-01 ENCOUNTER — Other Ambulatory Visit: Payer: Self-pay

## 2021-03-01 ENCOUNTER — Encounter: Payer: Self-pay | Admitting: Internal Medicine

## 2021-03-01 VITALS — BP 128/84 | HR 82 | Temp 95.8°F | Ht 69.0 in | Wt 306.4 lb

## 2021-03-01 DIAGNOSIS — Z720 Tobacco use: Secondary | ICD-10-CM

## 2021-03-01 DIAGNOSIS — G4733 Obstructive sleep apnea (adult) (pediatric): Secondary | ICD-10-CM | POA: Diagnosis not present

## 2021-03-01 DIAGNOSIS — I639 Cerebral infarction, unspecified: Secondary | ICD-10-CM

## 2021-03-01 DIAGNOSIS — G47411 Narcolepsy with cataplexy: Secondary | ICD-10-CM | POA: Diagnosis not present

## 2021-03-01 IMAGING — DX DG CHEST 2V
2 series · 2 of 2 positions shown · non-contrast
Comparison: [DATE]

CLINICAL DATA: 50-year-old male with a history of tobacco use

EXAM:
CHEST - 2 VIEW

[chest pa]
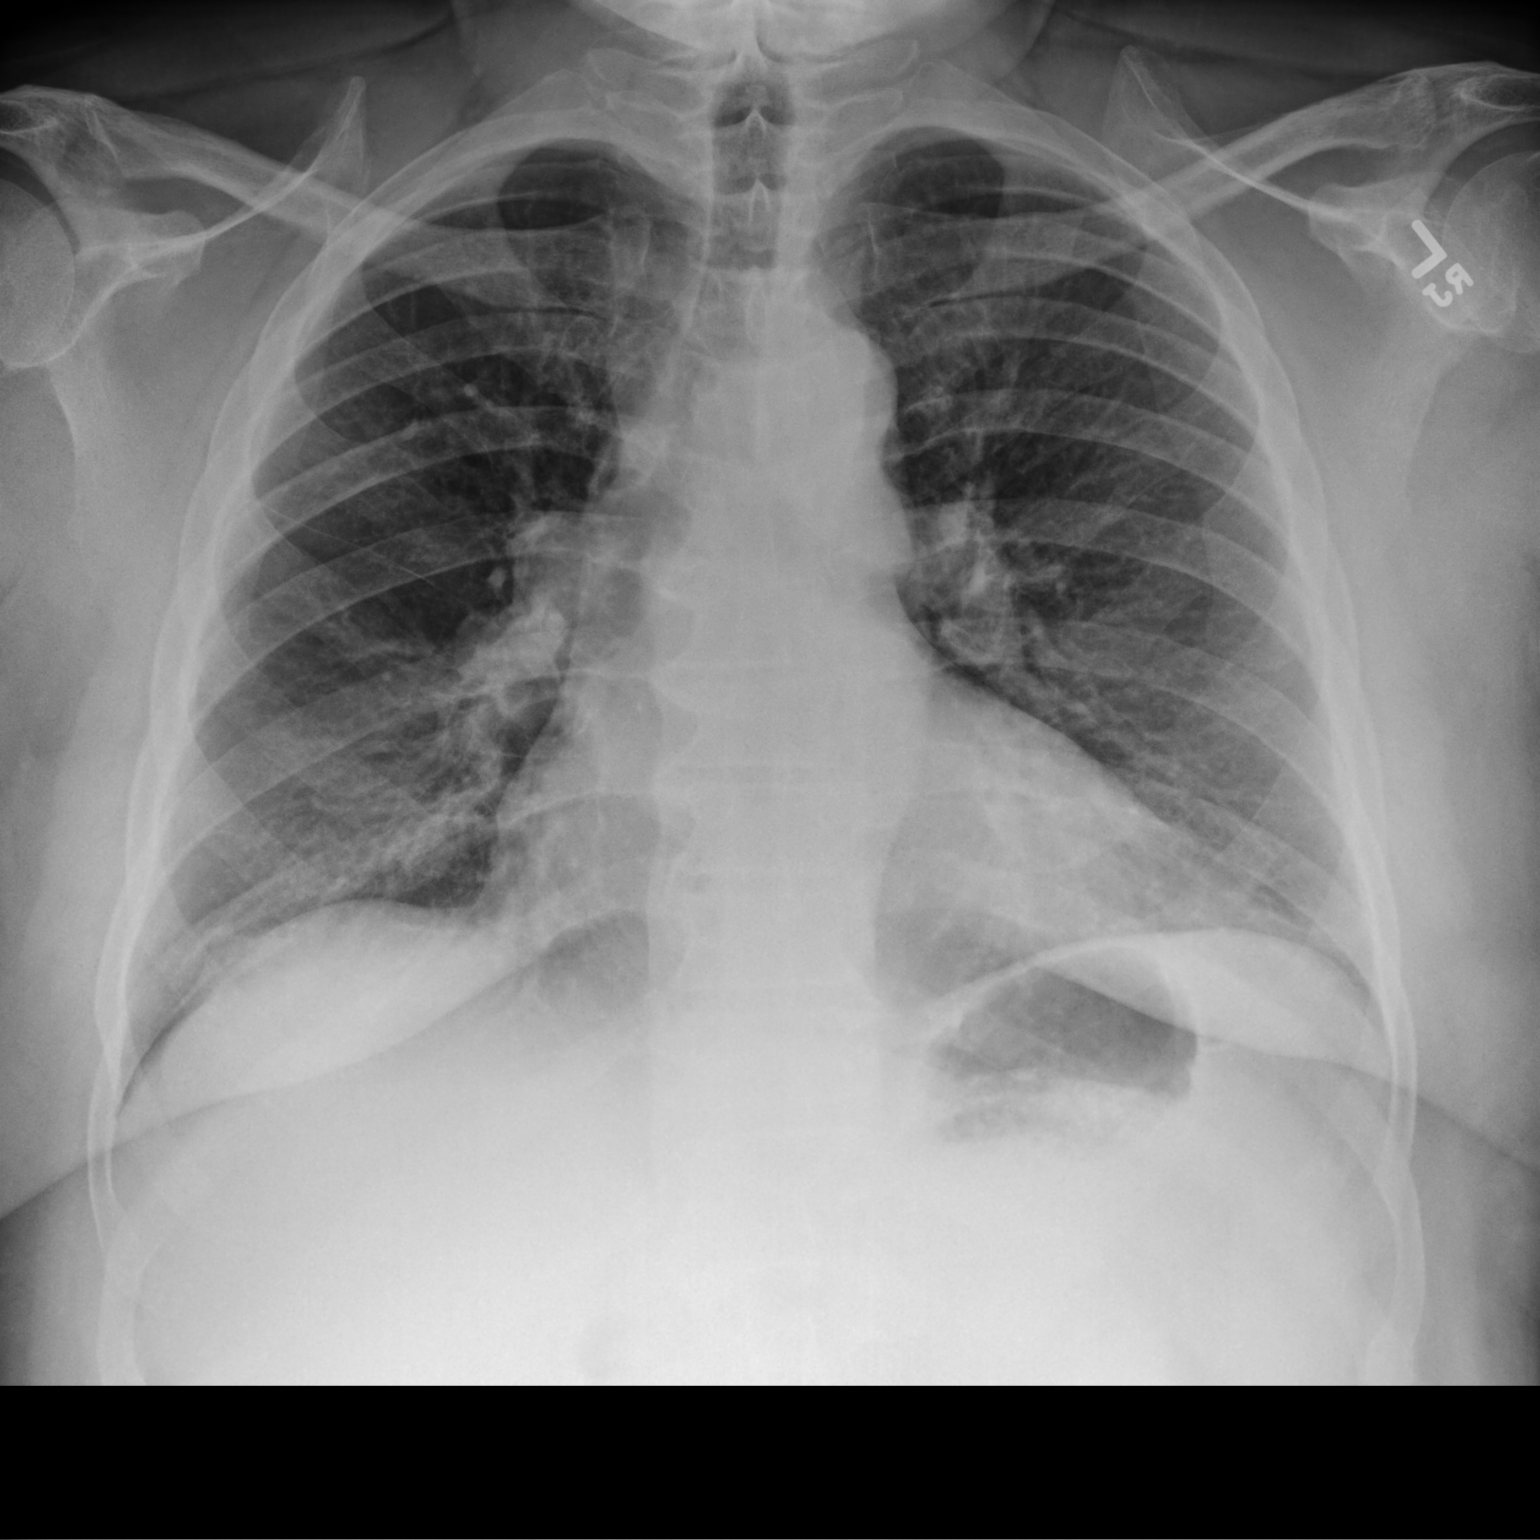

[chest lat]
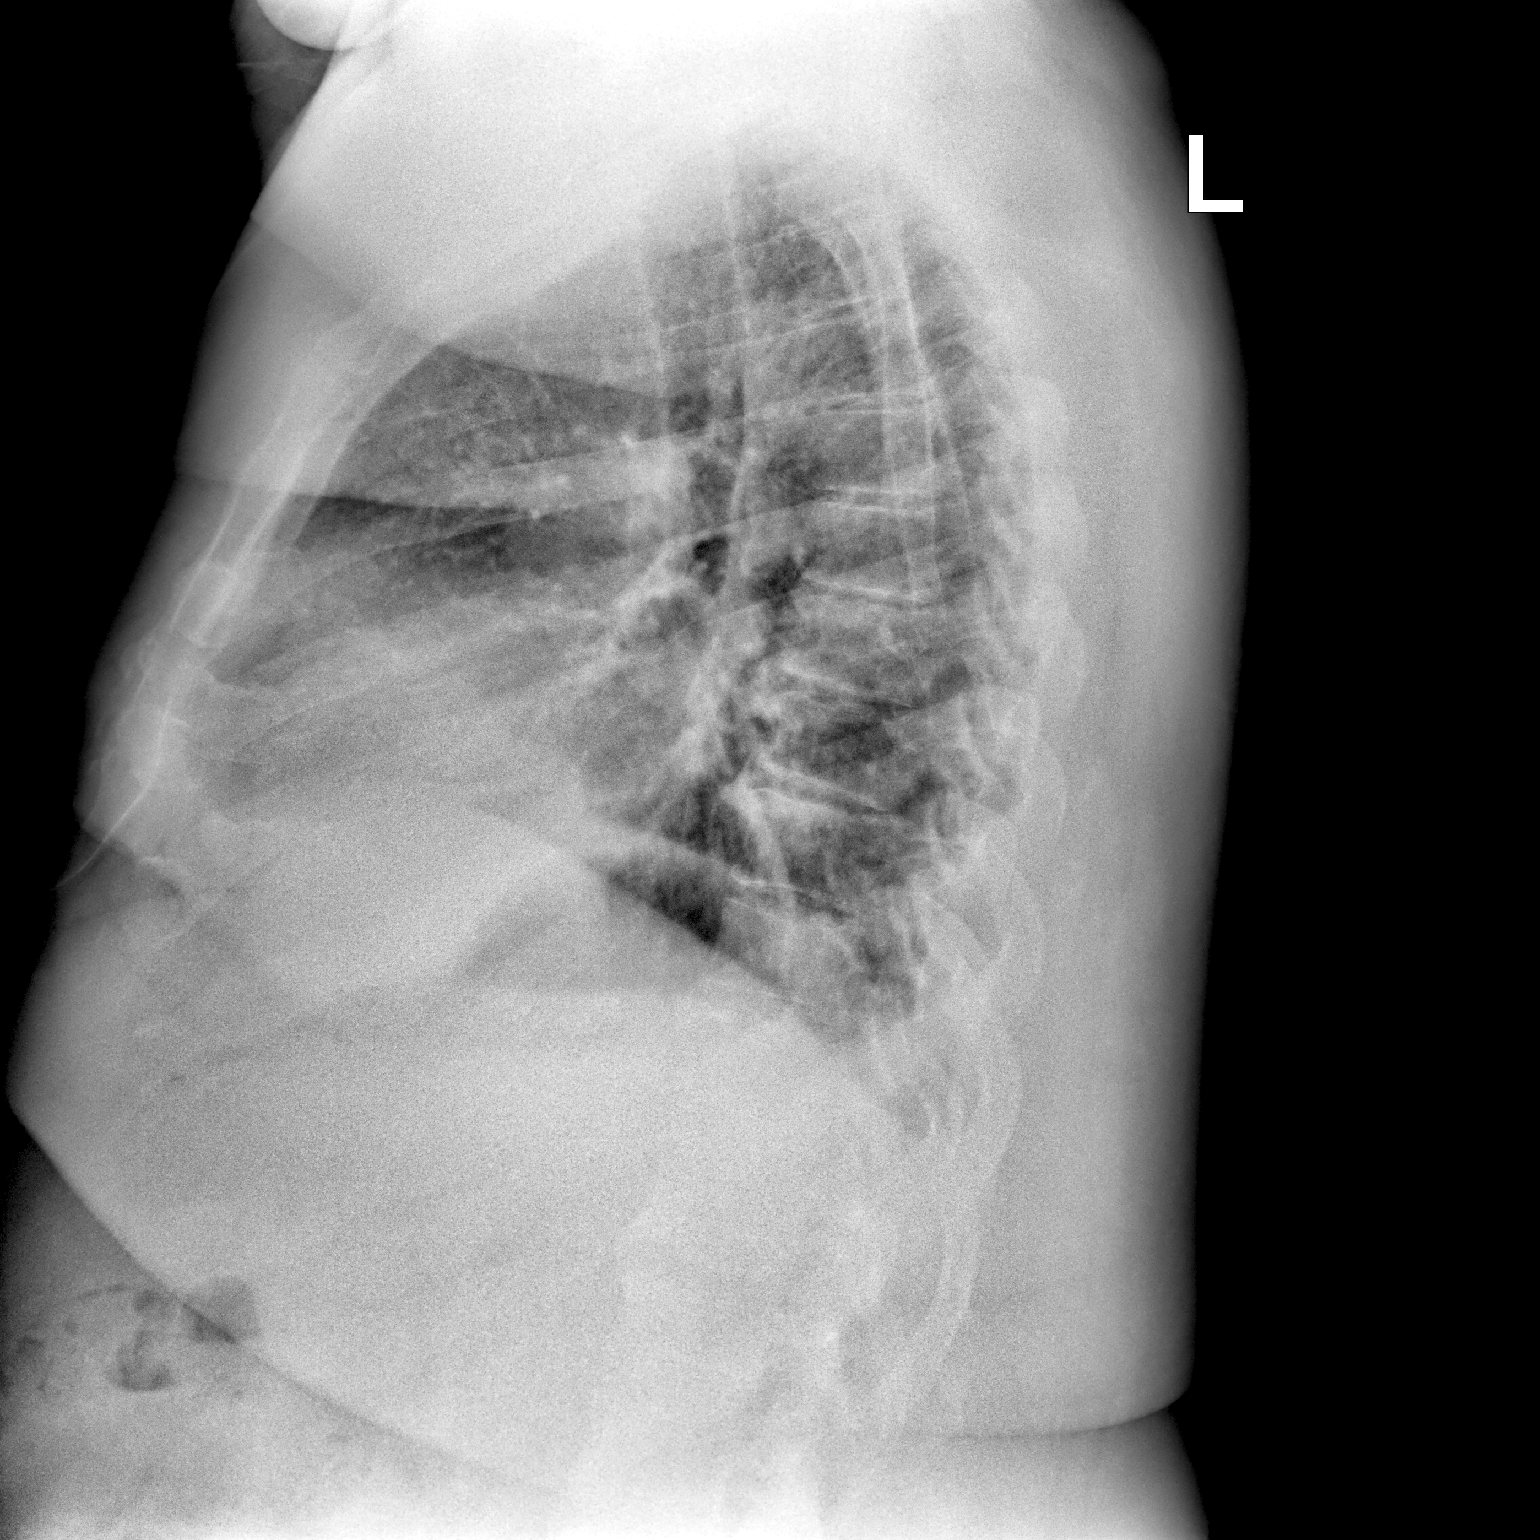

[2 of 2 positions shown; findings below may reference images not displayed]

FINDINGS: Cardiomediastinal silhouette unchanged in size and contour.

New reticulonodular opacities at the right greater than left lung
bases. No pneumothorax or pleural effusion.

Degenerative changes the spine.  No acute displaced fracture
IMPRESSION: New reticulonodular opacities of the right greater than left lung
base, with the differential including atypical infection or
developing chronic changes. Either repeat plain film in 3-4 weeks,
or chest CT may be considered.

## 2021-03-01 MED ORDER — MODAFINIL 200 MG PO TABS: 200.0000 mg | ORAL_TABLET | Freq: Every day | ORAL | 0 refills | Status: DC

## 2021-03-01 NOTE — Assessment & Plan Note (Signed)
Work on [employer] and diet/ exercise

## 2021-03-01 NOTE — Patient Instructions (Signed)
Order- CXR dx tobacco user  Order- schedule split night sleep study at sleep center   Dx OSA,  Narcolepsy, CVA  Script sent for modafinil 200 mg    1 daily to help alertness  Continue Adderal for now

## 2021-03-01 NOTE — Assessment & Plan Note (Signed)
Encouraged to stop completely Plan- CXR

## 2021-03-01 NOTE — Assessment & Plan Note (Signed)
Needs control of OSA first.  Even so, functioning poorly- Plan- emphasis on naps and sleep hygiene. Try adding modafinil. Consider trial venlafaxine for cataplexy.

## 2021-03-01 NOTE — Assessment & Plan Note (Signed)
Difficult to manage other sleep problems when noncompliant with treatment for significant OSA. Too heavy for Inspire and not likely to benefit from oral appliance.  Plan- Split night study to re-establish control of OSA

## 2021-03-02 ENCOUNTER — Other Ambulatory Visit: Payer: Self-pay | Admitting: Internal Medicine

## 2021-03-02 DIAGNOSIS — R918 Other nonspecific abnormal finding of lung field: Secondary | ICD-10-CM

## 2021-03-04 ENCOUNTER — Telehealth (INDEPENDENT_AMBULATORY_CARE_PROVIDER_SITE_OTHER): Payer: 59 | Admitting: Interventional Cardiology

## 2021-03-04 ENCOUNTER — Encounter: Payer: Self-pay | Admitting: Interventional Cardiology

## 2021-03-04 ENCOUNTER — Other Ambulatory Visit: Payer: Self-pay

## 2021-03-04 VITALS — Ht 69.0 in | Wt 306.0 lb

## 2021-03-04 DIAGNOSIS — I480 Paroxysmal atrial fibrillation: Secondary | ICD-10-CM

## 2021-03-04 DIAGNOSIS — I10 Essential (primary) hypertension: Secondary | ICD-10-CM

## 2021-03-04 DIAGNOSIS — Z7901 Long term (current) use of anticoagulants: Secondary | ICD-10-CM

## 2021-03-04 DIAGNOSIS — I5032 Chronic diastolic (congestive) heart failure: Secondary | ICD-10-CM | POA: Diagnosis not present

## 2021-03-04 NOTE — Progress Notes (Signed)
Virtual Visit via Telephone Note   This visit type was conducted due to national recommendations for restrictions regarding the COVID-19 Pandemic (e.g. social distancing) in an effort to limit this patient's exposure and mitigate transmission in our community.  Due to his co-morbid illnesses, this patient is at least at moderate risk for complications without adequate follow up.  This format is felt to be most appropriate for this patient at this time.  The patient did not have access to video technology/had technical difficulties with video requiring transitioning to audio format only (telephone).  All issues noted in this document were discussed and addressed.  No physical exam could be performed with this format.  Please refer to the patient's chart for his  consent to telehealth for [facility].    Date:  03/04/2021   ID:  Douglas Martyr., DOB 1970/11/15, MRN [id number] The patient was identified using 2 identifiers.  Patient Location: Home Provider Location: Home Office   PCP:  [name], MD   [place] [facility] Cardiologist:  Larae Grooms, MD     Evaluation Performed:  Follow-Up Visit  Chief Complaint:  AFib  History of Present Illness:    Douglas Nehring. is a 50 y.o. male with who presents via audio/video conferencing for a telehealth visit today.    He has a hx of HTN, DM2, obesity, OSA on CPAP, prior ETOH/cocaine use and PAF. Remotely seen by Dr. Verlon Setting in the past. He was evaluated by Dr. Liam Rogers in 4/14 in the hospital during an admission with PAF with RVR.Ritalin was adrug tobe avoided in the futuresince the AFib started with medicine. He converted to NSR and long-term anticoagulation was recommended given his stroke risk. Flecainide was added to maintain NSR. He was to follow up in this office for ETT to rule out pro-arrhythmia, but was not seen here until 2/16. Review of his chart indicates he had an ETT at [facility] in  02/2013. He was placed back on Flecainide and FU ETT-Myoview was low risk and neg for pro-arrhythmia.   CHADS2-VASc=2 (HTN, DM)  Patient has been taking Adderall for narcolepsy. Now retired from his job with the city due to narcolepsy.  The patient does not have symptoms concerning for COVID-19 infection (fever, chills, cough, or new shortness of breath).   He had an ablation in 11/2019.  His atrial tachycardia could not be ablated. Patient reports that he has done well since his procedure with no further heart racing or palpitations.  He had a stroke in Aug 2021 (had missed Xarelto) and Feb 2022,  On Xarelto on that occasion, in the setting of hyperkalemia.  Disontinued ACE-I and aldactone.  Switched to [employer].   Right sided weakness from stroke limits walking.  Has DOE.  Denies : Chest pain. Dizziness. Leg edema. Nitroglycerin use. Orthopnea. Palpitations. Paroxysmal nocturnal dyspnea. Syncope.   Past Medical History:  Diagnosis Date  . Acute pancreatitis 01/22/2019  . Diabetes mellitus   . GERD (gastroesophageal reflux disease)   . History of alcohol abuse   . History of cocaine use   . History of echocardiogram    a. Echo 4/14: Moderate LVH, vigorous LVEF, EF 65-70%, normal wall motion, grade 2 diastolic dysfunction, mildly dilated aortic root and ascending aorta, ascending aorta 40 mm, aortic root 38 mm, mild LAE  . Hx of cardiovascular stress test    a. GXT 5/14: No ischemic changes  //  b. ETT-Myoview 3/16:  Low risk, no ischemia, EF 58%  .  Hyperlipidemia   . Hypertension   . Morbid obesity ([place])   . Paroxysmal atrial fibrillation ([place])    Occurring in 2008, with several recurrence since then (including in the setting of + cocaine on UDS).  . Sleep apnea    uses CPAP  . SVT (supraventricular tachycardia) ([place])    mid to long RP SVT 09/2019   Past Surgical History:  Procedure Laterality Date  . APPENDECTOMY    . ATRIAL FIBRILLATION ABLATION N/A 11/27/2019   Procedure:  ATRIAL FIBRILLATION ABLATION;  Surgeon: Thompson Grayer, MD;  Location: [facility];  Service: Cardiovascular;  Laterality: N/A;  . ROTATOR CUFF REPAIR    . SVT ABLATION N/A 11/27/2019   Procedure: SVT ABLATION;  Surgeon: Thompson Grayer, MD;  Location: [facility];  Service: Cardiovascular;  Laterality: N/A;  . TONSILLECTOMY       Current Meds  Medication Sig  . Accu-Chek Softclix Lancets lancets TEST UP TO 4 TIMES A DAY  . amphetamine-dextroamphetamine (ADDERALL XR) 30 MG 24 hr capsule Take 1 capsule (30 mg total) by mouth daily as needed (focus). 1 daily as needed  . apixaban (ELIQUIS) 5 MG TABS tablet Take 1 tablet (5 mg total) by mouth 2 (two) times daily.  Marland Kitchen atorvastatin (LIPITOR) 80 MG tablet Take 1 tablet (80 mg total) by mouth daily.  . blood glucose meter kit and supplies KIT Dispense based on patient and insurance preference. Use up to four times daily as directed.  . Dulaglutide (TRULICITY) 1.5 YP/9.5KD SOPN Inject 1.5 mg into the skin once a week.  . flecainide (TAMBOCOR) 100 MG tablet Take 1 tablet (100 mg total) by mouth 2 (two) times daily.  Marland Kitchen lactose free nutrition (BOOST) LIQD Take 237 mLs by mouth daily.  . metFORMIN (GLUCOPHAGE) 500 MG tablet Take 1,000 mg by mouth 2 (two) times daily with a meal.   . metoprolol succinate (TOPROL XL) 100 MG 24 hr tablet Take 1 tablet (100 mg total) by mouth daily. Take with or immediately following a meal.  . modafinil (PROVIGIL) 200 MG tablet Take 1 tablet (200 mg total) by mouth daily.  . nicotine (NICODERM CQ - DOSED IN MG/24 HOURS) 21 mg/24hr patch PLACE 1 PATCH (21 MG TOTAL) ONTO THE SKIN DAILY.  Marland Kitchen Olmesartan-amLODIPine-HCTZ 40-10-25 MG TABS Take 1 tablet by mouth daily.  . pantoprazole (PROTONIX) 40 MG tablet Take 1 tablet (40 mg total) by mouth daily.     Allergies:   Shrimp [shellfish allergy], Banana, Watermelon flavor, Other, Almond oil, Peanut-containing drug products, and Sulfa antibiotics   Social History    Tobacco Use  . Smoking status: Current Every Day Smoker    Packs/day: 0.50    Years: 28.00    Pack years: 14.00    Types: Cigarettes  . Smokeless tobacco: Former Systems developer    Types: Snuff  Vaping Use  . Vaping Use: Never used  Substance Use Topics  . Alcohol use: Not Currently    Alcohol/week: 6.0 standard drinks    Types: 6 Standard drinks or equivalent per week    Comment: monthly  . Drug use: Not Currently    Comment: cocaine in the past, none currently     Family Hx: The patient's family history includes Diabetes in his mother and paternal grandfather; Heart attack (age of onset: 53) in his father; Stroke in his maternal grandmother and paternal grandmother.  ROS:   Please see the history of present illness.     All other systems reviewed  and are negative.   Prior CV studies:   The following studies were reviewed today:  Hospital records reviewed; normal EF on 2/22 echo  Labs/Other Tests and Data Reviewed:    EKG:  An ECG dated 11/19/20 was personally reviewed today and demonstrated:  NSR, hyperacute T waves, nonspecific ST changes  Recent Labs: 06/01/2020: ALT 22 11/19/2020: Magnesium 1.9; TSH 1.611 11/21/2020: Hemoglobin 12.9; Platelets 273 12/29/2020: BUN 37; Creatinine, Ser 1.36; Potassium 5.0; Sodium 141   Recent Lipid Panel Lab Results  Component Value Date/Time   CHOL 184 11/19/2020 02:39 PM   TRIG 274 (H) 11/19/2020 02:39 PM   HDL 27 (L) 11/19/2020 02:39 PM   CHOLHDL 6.8 11/19/2020 02:39 PM   LDLCALC 102 (H) 11/19/2020 02:39 PM   LDLDIRECT 103.0 12/08/2016 08:52 AM    Wt Readings from Last 3 Encounters:  03/04/21 (!) 306 lb (138.8 kg)  03/01/21 (!) 306 lb 6.4 oz (139 kg)  02/23/21 (!) 311 lb 14.4 oz (141.5 kg)     Risk Assessment/Calculations:    CHA2DS2-VASc Score = 5  This indicates a 7.2% annual risk of stroke. The patient's score is based upon: CHF History: Yes HTN History: Yes Diabetes History: Yes Stroke History: Yes Vascular Disease  History: No Age Score: 0 Gender Score: 0     Objective:    Vital Signs:  Ht _0  (1.753 m)   Wt (!) 306 lb (138.8 kg)   BMI 45.19 kg/m    VITAL SIGNS:  reviewed GEN:  no acute distress NEURO:  alert and oriented x 3, no obvious focal deficit PSYCH:  normal affect exam limited by ohone format; no shortness of breath  ASSESSMENT & PLAN:    1. AFib: s/p ablation.  NSR at last check.  Flecainide or NSR. 2. Chronic diastolic heart failure:  Normal EF.  Describes DOE.  Will have BMet and BNP checked at next visit.  If BNP elevated, may need to change HCTZ to torsemide. I encouraged him to do the exercise PT showed him to improve stamina.  3. Anticoagulated: Eliquis for stroke prevention.  Patient had a stroke while on Xarelto.  4. Morbid obesity: Needs healthy diet and more exercise.  5. HTN: Was controlled at last check.         COVID-19 Education: The signs and symptoms of COVID-19 were discussed with the patient and how to seek care for testing (follow up with PCP or arrange E-visit).    Time:   Today, I have spent 22 minutes with the patient with telehealth technology discussing the above problems.     Medication Adjustments/Labs and Tests Ordered: Current medicines are reviewed at length with the patient today.  Concerns regarding medicines are outlined above.   Tests Ordered: No orders of the defined types were placed in this encounter.   Medication Changes: No orders of the defined types were placed in this encounter.   Follow Up:  In Person in 6 month(s)  Signed, Larae Grooms, MD  03/04/2021 1:54 PM    [facility]

## 2021-03-04 NOTE — Patient Instructions (Signed)
Medication Instructions:  Your physician recommends that you continue on your current medications as directed. Please refer to the Current Medication list given to you today.  *If you need a refill on your cardiac medications before your next appointment, please call your pharmacy*   Lab Work: Have BMP and BNP checked next time you see your primary care provider If you have labs (blood work) drawn today and your tests are completely normal, you will receive your results only by: Marland Kitchen MyChart Message (if you have MyChart) OR . A paper copy in the mail If you have any lab test that is abnormal or we need to change your treatment, we will call you to review the results.   Testing/Procedures: none   Follow-Up: At [facility], you and your health needs are our priority.  As part of our continuing mission to provide you with exceptional heart care, we have created designated Provider Care Teams.  These Care Teams include your primary Cardiologist (physician) and Advanced Practice Providers (APPs -  Physician Assistants and Nurse Practitioners) who all work together to provide you with the care you need, when you need it.  We recommend signing up for the patient portal called "MyChart".  Sign up information is provided on this After Visit Summary.  MyChart is used to connect with patients for Virtual Visits (Telemedicine).  Patients are able to view lab/test results, encounter notes, upcoming appointments, etc.  Non-urgent messages can be sent to your provider as well.   To learn more about what you can do with MyChart, go to [email or web address].    Your next appointment:   6 month(s)  The format for your next appointment:   In Person  Provider:   You may see Lance Muss, MD   or one of the following Advanced Practice Providers on your designated Care Team:    Ronie Spies, PA-C  Jacolyn Reedy, PA-C    Other Instructions

## 2021-03-08 ENCOUNTER — Other Ambulatory Visit: Payer: Self-pay | Admitting: Internal Medicine

## 2021-03-15 ENCOUNTER — Encounter (INDEPENDENT_AMBULATORY_CARE_PROVIDER_SITE_OTHER): Payer: Self-pay | Admitting: Ophthalmology

## 2021-03-15 ENCOUNTER — Ambulatory Visit (INDEPENDENT_AMBULATORY_CARE_PROVIDER_SITE_OTHER): Payer: 59 | Admitting: Ophthalmology

## 2021-03-15 ENCOUNTER — Other Ambulatory Visit: Payer: Self-pay

## 2021-03-15 DIAGNOSIS — H35033 Hypertensive retinopathy, bilateral: Secondary | ICD-10-CM | POA: Diagnosis not present

## 2021-03-15 DIAGNOSIS — E113491 Type 2 diabetes mellitus with severe nonproliferative diabetic retinopathy without macular edema, right eye: Secondary | ICD-10-CM | POA: Insufficient documentation

## 2021-03-15 DIAGNOSIS — E113493 Type 2 diabetes mellitus with severe nonproliferative diabetic retinopathy without macular edema, bilateral: Secondary | ICD-10-CM | POA: Diagnosis not present

## 2021-03-15 DIAGNOSIS — G4733 Obstructive sleep apnea (adult) (pediatric): Secondary | ICD-10-CM

## 2021-03-15 NOTE — Assessment & Plan Note (Signed)
Patient reports poor to no compliance with CPAP use.  I reviewed with the patient the potential benefits of using CPAP particularly importance of sense of wellbeing, more energy, less awakening at night but physiologically also with less evanescent hypertensive episodes which could start trigger stroke, better blood sugar control better with pressure control all the other attendant benefits that were explained in some lay person terminology detail

## 2021-03-15 NOTE — Assessment & Plan Note (Signed)
The nature of severe nonproliferative diabetic retinopathy discussed with the patient as well as the need for more frequent follow up and likely progression to proliferative disease in the near future. The options of continued observation versus panretinal photocoagulation at this time were reviewed as well as the risks, benefits, and alternatives. More recent option includes the use of ocular injectable medications to slow progression of retinal disease. Tight control of glucose, blood pressure, and serum lipid levels were recommended under the direction of general physician or endocrinologist, as well as avoidance of smoking and maintenance of normal body weight. The 2-year risk of progression to proliferative diabetic retinopathy is 60%.  Superimposed hypertensive retinopathy apparent likely due to noncompliance with CPAP in the nightly hypertensive episodes occur with nightly hypoxic induced damage

## 2021-03-15 NOTE — Progress Notes (Signed)
03/15/2021     CHIEF COMPLAINT Patient presents for Diabetic Eye Exam (1-2 year fu and OCT/Pt states,"My va seems to be worse. I cannot see to read small print anymore. I had two strokes recently and they say that I cannot see on one side anymore but I cannot tell.'/Pt reports stroke in August 2021 and February 2022/A1C: 9.9/LBS: 140)   HISTORY OF PRESENT ILLNESS: [name]. is a 50 y.o. male who presents to the clinic today for:   HPI    Diabetic Eye Exam    Vision: is blurred for near and is worsening   Associated Symptoms: Negative for Flashes and Floaters   Diabetes Type: Type 2   Onset: 20 years ago   Blood Sugars: fluctuates   Last Blood Glucose: 140   Last A1C: 9.9   Comments: 1-2 year fu and OCT Pt states,"My va seems to be worse. I cannot see to read small print anymore. I had two strokes recently and they say that I cannot see on one side anymore but I cannot tell.' Pt reports stroke in August 2021 and February 2022 A1C: 9.9 LBS: 140       Last edited by Demetrios Loll, COA on 03/15/2021  9:21 AM. (History)      Referring physician: Evlyn Kanner, MD [address] [place],  [place] [address]  HISTORICAL INFORMATION:   Selected notes from the MEDICAL RECORD NUMBER    Lab Results  Component Value Date   HGBA1C 8.3 (A) 02/23/2021     CURRENT MEDICATIONS: No current outpatient medications on file. (Ophthalmic Drugs)   No current facility-administered medications for this visit. (Ophthalmic Drugs)   Current Outpatient Medications (Other)  Medication Sig  . Accu-Chek Softclix Lancets lancets TEST UP TO 4 TIMES A DAY  . amphetamine-dextroamphetamine (ADDERALL XR) 30 MG 24 hr capsule Take 1 capsule (30 mg total) by mouth daily as needed (focus). 1 daily as needed  . apixaban (ELIQUIS) 5 MG TABS tablet Take 1 tablet (5 mg total) by mouth 2 (two) times daily.  Marland Kitchen atorvastatin (LIPITOR) 80 MG tablet Take 1 tablet (80 mg total) by mouth daily.  .  blood glucose meter kit and supplies KIT Dispense based on patient and insurance preference. Use up to four times daily as directed.  . Blood Pressure Monitoring (BLOOD PRESSURE MONITOR/L CUFF) MISC 1 Units by Does not apply route daily. (Patient not taking: Reported on 03/04/2021)  . Dulaglutide (TRULICITY) 1.5 MG/0.5ML SOPN Inject 1.5 mg into the skin once a week.  . flecainide (TAMBOCOR) 100 MG tablet Take 1 tablet (100 mg total) by mouth 2 (two) times daily. Please make yearly appt with Dr. Johney Frame for June 2022 before anymore refills. Thank you 1st attempt  . lactose free nutrition (BOOST) LIQD Take 237 mLs by mouth daily.  . metFORMIN (GLUCOPHAGE) 500 MG tablet Take 1,000 mg by mouth 2 (two) times daily with a meal.   . metoprolol succinate (TOPROL XL) 100 MG 24 hr tablet Take 1 tablet (100 mg total) by mouth daily. Take with or immediately following a meal.  . modafinil (PROVIGIL) 200 MG tablet Take 1 tablet (200 mg total) by mouth daily.  . nicotine (NICODERM CQ - DOSED IN MG/24 HOURS) 21 mg/24hr patch PLACE 1 PATCH (21 MG TOTAL) ONTO THE SKIN DAILY.  Marland Kitchen Olmesartan-amLODIPine-HCTZ 40-10-25 MG TABS Take 1 tablet by mouth daily.  . pantoprazole (PROTONIX) 40 MG tablet Take 1 tablet (40 mg total) by mouth daily.  No current facility-administered medications for this visit. (Other)      REVIEW OF SYSTEMS:    ALLERGIES Allergies  Allergen Reactions  . Shrimp [Shellfish Allergy] Anaphylaxis  . Banana Other (See Comments)    Pt gags  . Watermelon Flavor Nausea And Vomiting  . Other Itching    Grapes cause itching  . Almond Oil Itching    Roof of mouth itches  . Peanut-Containing Drug Products Itching and Cough  . Sulfa Antibiotics Itching    PAST MEDICAL HISTORY Past Medical History:  Diagnosis Date  . Acute pancreatitis 01/22/2019  . Diabetes mellitus   . GERD (gastroesophageal reflux disease)   . History of alcohol abuse   . History of cocaine use   . History of  echocardiogram    a. Echo 4/14: Moderate LVH, vigorous LVEF, EF 65-70%, normal wall motion, grade 2 diastolic dysfunction, mildly dilated aortic root and ascending aorta, ascending aorta 40 mm, aortic root 38 mm, mild LAE  . Hx of cardiovascular stress test    a. GXT 5/14: No ischemic changes  //  b. ETT-Myoview 3/16:  Low risk, no ischemia, EF 58%  . Hyperlipidemia   . Hypertension   . Morbid obesity (HCC)   . Paroxysmal atrial fibrillation (HCC)    Occurring in 2008, with several recurrence since then (including in the setting of + cocaine on UDS).  . Sleep apnea    uses CPAP  . SVT (supraventricular tachycardia) (HCC)    mid to long RP SVT 09/2019   Past Surgical History:  Procedure Laterality Date  . APPENDECTOMY    . ATRIAL FIBRILLATION ABLATION N/A 11/27/2019   Procedure: ATRIAL FIBRILLATION ABLATION;  Surgeon: [name], MD;  Location: [facility];  Service: Cardiovascular;  Laterality: N/A;  . ROTATOR CUFF REPAIR    . SVT ABLATION N/A 11/27/2019   Procedure: SVT ABLATION;  Surgeon: [name], MD;  Location: [facility];  Service: Cardiovascular;  Laterality: N/A;  . TONSILLECTOMY      FAMILY HISTORY Family History  Problem Relation Age of Onset  . Diabetes Mother   . Heart attack Father 64  . Stroke Maternal Grandmother   . Stroke Paternal Grandmother   . Diabetes Paternal Grandfather     SOCIAL HISTORY Social History   Tobacco Use  . Smoking status: Current Every Day Smoker    Packs/day: 0.50    Years: 28.00    Pack years: 14.00    Types: Cigarettes  . Smokeless tobacco: Former Neurosurgeon    Types: Snuff  Vaping Use  . Vaping Use: Never used  Substance Use Topics  . Alcohol use: Not Currently    Alcohol/week: 6.0 standard drinks    Types: 6 Standard drinks or equivalent per week    Comment: monthly  . Drug use: Not Currently    Comment: cocaine in the past, none currently         OPHTHALMIC EXAM:  Base Eye Exam    Visual Acuity  (ETDRS)      Right Left   Dist [place] 20/25 -1 20/40 +2       Tonometry (Tonopen, 9:27 AM)      Right Left   Pressure 21 20       Pupils      Pupils Dark Light Shape React APD   Right PERRL 4 4 Round Minimal None   Left PERRL 4 4 Round Minimal None       Visual Fields  Left Right   Restrictions Partial outer superior temporal deficiency Partial outer superior temporal deficiency       Extraocular Movement      Right Left    Full Full       Neuro/Psych    Oriented x3: Yes       Dilation    Both eyes: 1.0% Mydriacyl, 2.5% Phenylephrine @ 9:27 AM        Slit Lamp and Fundus Exam    External Exam      Right Left   External Normal Normal       Slit Lamp Exam      Right Left   Lids/Lashes Normal Normal   Conjunctiva/Sclera White and quiet White and quiet   Cornea Clear Clear   Anterior Chamber Deep and quiet Deep and quiet   Iris Round and reactive Round and reactive   Lens 1+ Cortical cataract 1+ Cortical cataract   Anterior Vitreous Normal Normal       Fundus Exam      Right Left   Posterior Vitreous Normal Normal   Disc Normal Normal   C/D Ratio 0.35 0.35   Macula Normal Normal   Vessels NPDR-Severe, hypertensive retinopathy, Arteriolar narrowing NPDR-Severe, hypertensive retinopathy, Arteriolar narrowing   Periphery Cotton wool spots mostly in the posterior pole Cotton wool spots mostly in the posterior pole          IMAGING AND PROCEDURES  Imaging and Procedures for 03/15/21  OCT, Retina - OU - Both Eyes       Right Eye Quality was good. Scan locations included subfoveal. Central Foveal Thickness: 236. Progression has been stable. Findings include abnormal foveal contour.   Left Eye Quality was good. Scan locations included subfoveal. Central Foveal Thickness: 254. Progression has been stable. Findings include abnormal foveal contour.   Notes Minor exudates temporally, no active CSME OU observe                 ASSESSMENT/PLAN:  Obstructive sleep apnea Patient reports poor to no compliance with CPAP use.  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 The options of continued observation versus panretinal photocoagulation at this time were reviewed as well as the risks, benefits, and alternatives. More recent option includes the use of ocular injectable medications to slow progression of retinal disease. Tight control of glucose, blood pressure, and serum lipid levels were recommended under the direction of general physician or endocrinologist, as well as avoidance of smoking and maintenance of normal body weight. The 2-year risk of progression to proliferative diabetic retinopathy is 60%.  Superimposed hypertensive retinopathy apparent likely due to noncompliance with CPAP in the nightly hypertensive episodes occur with nightly hypoxic induced damage  Hypertensive retinopathy of both eyes, grade 2 Likely due to untreated OSA, I encourage restart CPAP and intensive compliance to prevent other vascular damage      ICD-10-CM   1. 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 Obstructive sleep apnea  G47.33   3. Hypertensive retinopathy of both eyes, grade 2  H35.033     1.   Severe nonproliferative diabetic retinopathy with superimposed hypertensive retinopathy most often seen in patient with uncontrolled hypertension or with noncompliance or untreated OSA.  2.  I spent some time explaining to Mr. Hoyt the critical importance of using CPAP at night for his overall improved sense of wellbeing, diabetes control, control of hypertensive damage upon the diabetic retinopathy as well as the remainder of the systemic benefits and CNS benefits and prevention including prevention of stroke 3.  Ophthalmic Meds Ordered this visit:  No orders of the defined types were placed in this encounter.      Return in about 4 months (around 07/15/2021) for DILATE OU, COLOR FP, OCT.  There are no Patient Instructions on file for this visit.   Explained the diagnoses, plan, and follow up with the patient and they expressed understanding.  Patient expressed understanding of the importance of proper follow up care.   Clent Demark Brianah Hopson [name] Diseases & Surgery of the Retina and Vitreous Retina & Diabetic [place] 03/15/21     Abbreviations: M myopia (nearsighted); A astigmatism; H hyperopia (farsighted); P presbyopia; Mrx spectacle prescription;  CTL contact lenses; OD right eye; OS left eye; OU both eyes  XT exotropia; ET esotropia; PEK punctate epithelial keratitis; PEE punctate epithelial erosions; DES dry eye syndrome; MGD meibomian gland dysfunction; ATs artificial tears; PFAT's preservative free artificial tears; [place] nuclear sclerotic cataract; PSC posterior subcapsular cataract; ERM epi-retinal membrane; PVD posterior vitreous detachment; RD retinal detachment; DM diabetes mellitus; DR diabetic retinopathy; NPDR non-proliferative diabetic retinopathy; PDR proliferative diabetic retinopathy; CSME clinically significant macular edema; DME diabetic macular edema; dbh dot blot hemorrhages; CWS cotton wool spot; POAG primary open angle glaucoma; C/D cup-to-disc ratio; HVF humphrey  visual field; GVF goldmann visual field; OCT optical coherence tomography; IOP intraocular pressure; BRVO Branch retinal vein occlusion; CRVO central retinal vein occlusion; CRAO central retinal artery occlusion; BRAO branch retinal artery occlusion; RT retinal tear; SB scleral buckle; PPV pars plana vitrectomy; VH Vitreous hemorrhage; PRP panretinal laser photocoagulation; IVK intravitreal kenalog; VMT vitreomacular traction; MH Macular hole;  NVD neovascularization of the disc; NVE neovascularization elsewhere; AREDS age related eye disease study; ARMD age related macular degeneration; POAG primary open angle glaucoma; EBMD epithelial/anterior basement membrane dystrophy; ACIOL anterior chamber intraocular lens; IOL intraocular lens; PCIOL posterior chamber intraocular lens; Phaco/IOL phacoemulsification with intraocular lens placement; [place] photorefractive keratectomy; LASIK laser assisted in situ keratomileusis; HTN hypertension; DM diabetes mellitus; COPD chronic obstructive pulmonary disease

## 2021-03-15 NOTE — Assessment & Plan Note (Signed)
Likely due to untreated OSA, I encourage restart CPAP and intensive compliance to prevent other vascular damage

## 2021-03-19 ENCOUNTER — Other Ambulatory Visit: Payer: Self-pay | Admitting: Internal Medicine

## 2021-03-28 ENCOUNTER — Other Ambulatory Visit: Payer: Self-pay | Admitting: Internal Medicine

## 2021-03-28 MED ORDER — AMPHETAMINE-DEXTROAMPHET ER 30 MG PO CP24: 30.0000 mg | ORAL_CAPSULE | Freq: Every day | ORAL | 0 refills | Status: DC | PRN

## 2021-03-28 NOTE — Telephone Encounter (Signed)
Adderall refilled

## 2021-03-28 NOTE — Telephone Encounter (Signed)
Patient requesting refill on Adderall XR 30 mg  Last OV 03/01/21 Last refill 02/24/21 #30 #0 RF   Medication is pended   Thank you

## 2021-04-06 ENCOUNTER — Other Ambulatory Visit: Payer: Self-pay

## 2021-04-06 ENCOUNTER — Encounter: Payer: Self-pay | Admitting: Student

## 2021-04-06 ENCOUNTER — Ambulatory Visit (INDEPENDENT_AMBULATORY_CARE_PROVIDER_SITE_OTHER): Payer: 59 | Admitting: Student

## 2021-04-06 DIAGNOSIS — I10 Essential (primary) hypertension: Secondary | ICD-10-CM

## 2021-04-06 DIAGNOSIS — E1165 Type 2 diabetes mellitus with hyperglycemia: Secondary | ICD-10-CM | POA: Diagnosis not present

## 2021-04-06 DIAGNOSIS — G4733 Obstructive sleep apnea (adult) (pediatric): Secondary | ICD-10-CM | POA: Diagnosis not present

## 2021-04-06 NOTE — Assessment & Plan Note (Signed)
BP Readings from Last 3 Encounters:  04/06/21 129/81  03/01/21 128/84  02/23/21 (!) 143/128   Blood pressure very well-controlled today. Mr. Holte reports that he has been doing well with his current regimen, denies chest pain, dyspnea, headaches, leg swelling. Encouraged him to continue the good work. During his visit today, I reviewed his pill bag to ensure he was on the correct medication. - Olmesartan-amlodipine-hydrochlorothiazide 40-10-25mg  - Follow-up in two months - Consider BMET at next appointment

## 2021-04-06 NOTE — Progress Notes (Signed)
   CC: hypertension follow-up  HPI:  [name] is a 50 y.o. with medical history as below presenting to [facility] for one month follow-up for blood pressure.  Please see problem-based list for further details, assessments, and plans.  Past Medical History:  Diagnosis Date   Acute pancreatitis 01/22/2019   Diabetes mellitus    GERD (gastroesophageal reflux disease)    History of alcohol abuse    History of cocaine use    History of echocardiogram    a. Echo 4/14: Moderate LVH, vigorous LVEF, EF 65-70%, normal wall motion, grade 2 diastolic dysfunction, mildly dilated aortic root and ascending aorta, ascending aorta 40 mm, aortic root 38 mm, mild LAE   Hx of cardiovascular stress test    a. GXT 5/14: No ischemic changes  //  b. ETT-Myoview 3/16:  Low risk, no ischemia, EF 58%   Hyperlipidemia    Hypertension    Morbid obesity (HCC)    Paroxysmal atrial fibrillation (HCC)    Occurring in 2008, with several recurrence since then (including in the setting of + cocaine on UDS).   Sleep apnea    uses CPAP   SVT (supraventricular tachycardia) (HCC)    mid to long RP SVT 09/2019   Review of Systems:  As per HPI  Physical Exam:  Vitals:   04/06/21 0909  BP: 129/81  Pulse: 75  Resp: (!) 32  Temp: 98.6 F (37 C)  TempSrc: Oral  SpO2: 99%  Weight: (!) 313 lb 11.2 oz (142.3 kg)  Height: 5\' 9"  (1.753 m)   General: Obese, sitting comfortably in chair, no acute distress CV: Regular rate, rhythm. No murmurs, rubs, gallops appreciated. Warm extremities. Pulm: Normal work of breathing on room air. Clear to auscultation bilaterally. MSK: Normal bulk, tone. No pitting edema bilaterally. Skin: Warm, dry. No lesions or rashes appreciated. Neuro: Awake, alert, answering questions appropriately.  Assessment & Plan:   See Encounters Tab for problem based charting.  Patient discussed with Dr.  

## 2021-04-06 NOTE — Assessment & Plan Note (Signed)
Since stopping the glipizide at his last appointment, [name] reports his sugars have been better controlled. Denies any episodes of hypoglycemia. Further mentions that his sugars range from 130-180 with only one sugar out of range at 240 after late-night snacking. He did bring his glucometer today which confirmed his report. Will continue at his current dose and have him return in two months to re-check his A1c. Can consider increasing dose of Trulicity for further glycemic control as well as weight loss, especially as he has gained weight in the last month. - Trulicity 1.5mg  weekly - Metformin 1000mg  twice daily - Repeat A1c in two months

## 2021-04-06 NOTE — Patient Instructions (Signed)
[name], it was a pleasure seeing you today!  Today we discussed: - Blood pressure: Your blood pressure looks great today! Continue taking the combination pill daily.  - Diabetes: Continue with your current regimen and we will re-check your sugars in two months.  I have ordered the following labs today:  Lab Orders  No laboratory test(s) ordered today     Tests ordered today:  None  Referrals ordered today:   Referral Orders  No referral(s) requested today     I have ordered the following medication/changed the following medications:   Stop the following medications: There are no discontinued medications.   Start the following medications: No orders of the defined types were placed in this encounter.    Follow-up: 2 months   Please make sure to arrive 15 minutes prior to your next appointment. If you arrive late, you may be asked to reschedule.   We look forward to seeing you next time. Please call our clinic at [phone] if you have any questions or concerns. The best time to call is Monday-Friday from 9am-4pm, but there is someone available 24/7. If after hours or the weekend, call the main hospital number and ask for the Internal Medicine Resident On-Call. If you need medication refills, please notify your pharmacy one week in advance and they will send [place] a request.  Thank you for letting us take part in your care. Wishing you the best!  Thank you, Evlyn Kanner, MD

## 2021-04-06 NOTE — Assessment & Plan Note (Signed)
[name] reports that he has not been wearing his CPAP like he should be. He mentioned that he is scheduled for another sleep study in July so that pressures could be adjusted accordingly. I encouraged Mr. Netzel to wear his CPAP nightly in order to reduce risk of further strokes and further help his blood pressure.

## 2021-04-10 NOTE — Progress Notes (Deleted)
Cardiology Office Note Date:  04/10/2021  Patient ID:  Douglas Rodin., DOB [birth year], MRN [id number] PCP:  [name], MD  Cardiologist:  Dr. Irish Lack Electrophysiologist: Dr. Rayann Heman  ***refresh   Chief Complaint:  *** annual EP visit  History of Present Illness: [name]. is a 50 y.o. male with history of HTN, HLD, DM, morbid obesity, OSA w/CPAP, hx of ETOH/cocaine, narcolepsy, stroke, Atach, Afib, chronic CHF (diastolic).  He comes in today to be seen for Dr. Rayann Heman, last seen by him 03/29/2020, at that time he was feeling well.  Had not had symptom of AFib or tach, his flecainide reduced to $RemoveBe'100mg'WvKIVXVpe$  BID and Toprol reduced to $RemoveBe'50mg'JWbYYeXyp$  QD  He was most recently seen by Dr. Irish Lack via tele health visit 03/04/21, mentioned some DOE, encouraged to exercise and try to improved his exertional capacity, planned to have BNP and BMET check at next in clinic visit if BNP elevated perhaps change his HCTZ to torsemide.  *** symptoms *** flecainide, BB, ekg *** volume *** BMET and BNP today *** using CPAP? *** ETOH, drugs? *** eliquis, bleeding, labs   AF hx Diagnosed 2014  AAD Flecainide started 2014  Review of his chart indicates he had an ETT at [facility] in 02/2013.  He was placed back on Flecainide and FU ETT-Myoview was low risk and neg for pro-arrhythmia  11/27/19: EPS/PVI Ablation, unable to map a long RP tachycardia (likely and ATach)  A/c hx Xarelto > stroke Aug 2021 with some missed xarelto >> another stroke Feb 2022 no missed doses > >> Eliquis   Past Medical History:  Diagnosis Date   Acute pancreatitis 01/22/2019   Diabetes mellitus    GERD (gastroesophageal reflux disease)    History of alcohol abuse    History of cocaine use    History of echocardiogram    a. Echo 4/14: Moderate LVH, vigorous LVEF, EF 65-70%, normal wall motion, grade 2 diastolic dysfunction, mildly dilated aortic root and ascending aorta, ascending aorta 40 mm, aortic root 38  mm, mild LAE   Hx of cardiovascular stress test    a. GXT 5/14: No ischemic changes  //  b. ETT-Myoview 3/16:  Low risk, no ischemia, EF 58%   Hyperlipidemia    Hypertension    Morbid obesity ([place])    Paroxysmal atrial fibrillation ([place])    Occurring in 2008, with several recurrence since then (including in the setting of + cocaine on UDS).   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 SVT 09/2019    Past Surgical History:  Procedure Laterality Date   APPENDECTOMY     ATRIAL FIBRILLATION ABLATION N/A 11/27/2019   Procedure: ATRIAL FIBRILLATION ABLATION;  Surgeon: Thompson Grayer, MD;  Location: [place] [facility];  Service: Cardiovascular;  Laterality: N/A;   ROTATOR CUFF REPAIR     SVT ABLATION N/A 11/27/2019   Procedure: SVT ABLATION;  Surgeon: Thompson Grayer, MD;  Location: [place] [facility];  Service: Cardiovascular;  Laterality: N/A;   TONSILLECTOMY      Current Outpatient Medications  Medication Sig Dispense Refill   Accu-Chek Softclix Lancets lancets TEST UP TO 4 TIMES A DAY 100 each 2   amphetamine-dextroamphetamine (ADDERALL XR) 30 MG 24 hr capsule Take 1 capsule (30 mg total) by mouth daily as needed (focus). 1 daily as needed 30 capsule 0   apixaban (ELIQUIS) 5 MG TABS tablet Take 1 tablet (5 mg  total) by mouth 2 (two) times daily. 90 tablet 3   atorvastatin (LIPITOR) 80 MG tablet Take 1 tablet (80 mg total) by mouth daily. 90 tablet 3   blood glucose meter kit and supplies KIT Dispense based on patient and insurance preference. Use up to four times daily as directed. 1 each 0   Blood Pressure Monitoring (BLOOD PRESSURE MONITOR/L CUFF) MISC 1 Units by Does not apply route daily. (Patient not taking: Reported on 03/04/2021) 1 each 0   Dulaglutide (TRULICITY) 1.5 MG/0.5ML SOPN Inject 1.5 mg into the skin once a week. 0.5 mL 5   flecainide (TAMBOCOR) 100 MG tablet Take 1 tablet (100 mg total) by mouth 2 (two) times daily. Please make  yearly appt with Dr. Johney Frame for June 2022 before anymore refills. Thank you 1st attempt 60 tablet 1   lactose free nutrition (BOOST) LIQD Take 237 mLs by mouth daily.     metFORMIN (GLUCOPHAGE) 500 MG tablet Take 1,000 mg by mouth 2 (two) times daily with a meal.      metoprolol succinate (TOPROL XL) 100 MG 24 hr tablet Take 1 tablet (100 mg total) by mouth daily. Take with or immediately following a meal. 30 tablet 11   modafinil (PROVIGIL) 200 MG tablet Take 1 tablet (200 mg total) by mouth daily. 30 tablet 0   nicotine (NICODERM CQ - DOSED IN MG/24 HOURS) 21 mg/24hr patch PLACE 1 PATCH (21 MG TOTAL) ONTO THE SKIN DAILY. 28 patch 1   Olmesartan-amLODIPine-HCTZ 40-10-25 MG TABS Take 1 tablet by mouth daily. 90 tablet 3   pantoprazole (PROTONIX) 40 MG tablet Take 1 tablet (40 mg total) by mouth daily. 30 tablet 5   No current facility-administered medications for this visit.    Allergies:   Shrimp [shellfish allergy], Banana, Watermelon flavor, Other, Almond oil, Peanut-containing drug products, and Sulfa antibiotics   Social History:  The patient  reports that he has been smoking cigarettes. He has a 2.80 pack-year smoking history. He has quit using smokeless tobacco.  His smokeless tobacco use included snuff. He reports previous alcohol use of about 6.0 standard drinks of alcohol per week. He reports previous drug use.   Family History:  The patient's family history includes Diabetes in his mother and paternal grandfather; Heart attack (age of onset: 25) in his father; Stroke in his maternal grandmother and paternal grandmother.  ROS:  Please see the history of present illness.    All other systems are reviewed and otherwise negative.   PHYSICAL EXAM:  VS:  There were no vitals taken for this visit. BMI: There is no height or weight on file to calculate BMI. Well nourished, well developed, in no acute distress HEENT: normocephalic, atraumatic Neck: no JVD, carotid bruits or  masses Cardiac:  *** RRR; no significant murmurs, no rubs, or gallops Lungs:  *** CTA b/l, no wheezing, rhonchi or rales Abd: soft, nontender MS: no deformity or *** atrophy Ext: *** no edema Skin: warm and dry, no rash Neuro:  No gross deficits appreciated Psych: euthymic mood, full affect    EKG:  Done today and reviewed by myself shows  ***  11/19/2020: TTE IMPRESSIONS   1. Left ventricular ejection fraction, by estimation, is 60 to 65%. The  left ventricle has normal function. The left ventricle has no regional  wall motion abnormalities. Left ventricular diastolic parameters are  consistent with Grade I diastolic  dysfunction (impaired relaxation).   2. Right ventricular systolic function is normal. The right ventricular  size is normal.   3. Left atrial size was mildly dilated.   4. The mitral valve is normal in structure. Mild mitral valve  regurgitation. No evidence of mitral stenosis. Moderate mitral annular  calcification.   5. The aortic valve is normal in structure. Aortic valve regurgitation is  not visualized. No aortic stenosis is present.   6. Aortic dilatation noted. There is mild dilatation of the ascending  aorta, measuring 39 mm.   7. The inferior vena cava is normal in size with greater than 50%  respiratory variability, suggesting right atrial pressure of 3 mmHg.   Comparison(s): No significant change from prior study. Prior images  reviewed side by side.    11/27/2019: EPS/PVI ablation CONCLUSIONS: 1. Sinus rhythm upon presentation.   2. Intracardiac echo reveals a moderate sized left atrium with four separate pulmonary veins without evidence of pulmonary vein stenosis. 3. Successful electrical isolation and anatomical encircling of all four pulmonary veins with radiofrequency current. 4. No inducible arrhythmias following ablation both on and off of Isuprel 5. The patient had nonsustained atrial tachycardia which is likely his prior long RP SVT which  was not reinducible or sustained for 3D mapping or ablation today.  He did not have accessory pathways.  He has not had AVNRT clinically. 6. No early apparent complications.   02/08/2018: ETT Patient walked for 5 minutes and 30 seconds. He achieved a peak heart rate of 151 which is 87% predicted maximal heart rate. Blood pressure demonstrated a hypertensive response to exercise. There were no ST or T wave changes to suggest ischemia. There was a premature ventricular contraction during the recovery phase. This is interpreted as a negative treadmill test.   Recent Labs: 06/01/2020: ALT 22 11/19/2020: Magnesium 1.9; TSH 1.611 11/21/2020: Hemoglobin 12.9; Platelets 273 12/29/2020: BUN 37; Creatinine, Ser 1.36; Potassium 5.0; Sodium 141  11/19/2020: Cholesterol 184; HDL 27; LDL Cholesterol 102; Total CHOL/HDL Ratio 6.8; Triglycerides 274; VLDL 55   CrCl cannot be calculated (Patient's most recent lab result is older than the maximum 21 days allowed.).   Wt Readings from Last 3 Encounters:  04/06/21 (!) 313 lb 11.2 oz (142.3 kg)  03/04/21 (!) 306 lb (138.8 kg)  03/01/21 (!) 306 lb 6.4 oz (139 kg)     Other studies reviewed: Additional studies/records reviewed today include: summarized above  ASSESSMENT AND PLAN:  Paoxysmal AFib Atach CHA2DS2Vasc is 5, on Eliquis, *** appropriately dosed  2. HTN ***  3. Chronic CHF (diastolic) ***  Disposition: F/u with ***  Current medicines are reviewed at length with the patient today.  The patient did not have any concerns regarding medicines.  Venetia Night, PA-C 04/10/2021 3:17 PM     [address] [place] [address] [phone] (office)  [phone] (fax)

## 2021-04-12 ENCOUNTER — Ambulatory Visit: Payer: 59 | Admitting: Physician Assistant

## 2021-04-13 ENCOUNTER — Ambulatory Visit: Payer: 59 | Admitting: Internal Medicine

## 2021-04-14 ENCOUNTER — Inpatient Hospital Stay (HOSPITAL_COMMUNITY)
Admission: EM | Admit: 2021-04-14 | Discharge: 2021-04-16 | DRG: 065 | Disposition: A | Discharge status: Home / Self Care | Payer: 59 | Attending: Internal Medicine | Admitting: Internal Medicine

## 2021-04-14 ENCOUNTER — Emergency Department (HOSPITAL_COMMUNITY): Payer: 59

## 2021-04-14 ENCOUNTER — Other Ambulatory Visit: Payer: Self-pay

## 2021-04-14 ENCOUNTER — Encounter (HOSPITAL_COMMUNITY): Payer: Self-pay | Admitting: Emergency Medicine

## 2021-04-14 ENCOUNTER — Inpatient Hospital Stay (HOSPITAL_COMMUNITY): Payer: 59

## 2021-04-14 DIAGNOSIS — E785 Hyperlipidemia, unspecified: Secondary | ICD-10-CM

## 2021-04-14 DIAGNOSIS — I639 Cerebral infarction, unspecified: Secondary | ICD-10-CM

## 2021-04-14 DIAGNOSIS — G47419 Narcolepsy without cataplexy: Secondary | ICD-10-CM | POA: Diagnosis present

## 2021-04-14 DIAGNOSIS — Z8249 Family history of ischemic heart disease and other diseases of the circulatory system: Secondary | ICD-10-CM | POA: Diagnosis not present

## 2021-04-14 DIAGNOSIS — D6859 Other primary thrombophilia: Secondary | ICD-10-CM | POA: Diagnosis present

## 2021-04-14 DIAGNOSIS — Z7984 Long term (current) use of oral hypoglycemic drugs: Secondary | ICD-10-CM

## 2021-04-14 DIAGNOSIS — Z20822 Contact with and (suspected) exposure to covid-19: Secondary | ICD-10-CM | POA: Diagnosis present

## 2021-04-14 DIAGNOSIS — Z7901 Long term (current) use of anticoagulants: Secondary | ICD-10-CM

## 2021-04-14 DIAGNOSIS — Z833 Family history of diabetes mellitus: Secondary | ICD-10-CM

## 2021-04-14 DIAGNOSIS — E119 Type 2 diabetes mellitus without complications: Secondary | ICD-10-CM

## 2021-04-14 DIAGNOSIS — D72829 Elevated white blood cell count, unspecified: Secondary | ICD-10-CM | POA: Diagnosis present

## 2021-04-14 DIAGNOSIS — I110 Hypertensive heart disease with heart failure: Secondary | ICD-10-CM | POA: Diagnosis present

## 2021-04-14 DIAGNOSIS — R4781 Slurred speech: Secondary | ICD-10-CM | POA: Diagnosis present

## 2021-04-14 DIAGNOSIS — K5900 Constipation, unspecified: Secondary | ICD-10-CM | POA: Diagnosis present

## 2021-04-14 DIAGNOSIS — E118 Type 2 diabetes mellitus with unspecified complications: Secondary | ICD-10-CM

## 2021-04-14 DIAGNOSIS — I10 Essential (primary) hypertension: Secondary | ICD-10-CM | POA: Diagnosis not present

## 2021-04-14 DIAGNOSIS — Z8673 Personal history of transient ischemic attack (TIA), and cerebral infarction without residual deficits: Secondary | ICD-10-CM | POA: Diagnosis not present

## 2021-04-14 DIAGNOSIS — I5032 Chronic diastolic (congestive) heart failure: Secondary | ICD-10-CM | POA: Diagnosis present

## 2021-04-14 DIAGNOSIS — R4189 Other symptoms and signs involving cognitive functions and awareness: Secondary | ICD-10-CM | POA: Diagnosis present

## 2021-04-14 DIAGNOSIS — D649 Anemia, unspecified: Secondary | ICD-10-CM | POA: Diagnosis present

## 2021-04-14 DIAGNOSIS — I6340 Cerebral infarction due to embolism of unspecified cerebral artery: Principal | ICD-10-CM | POA: Diagnosis present

## 2021-04-14 DIAGNOSIS — F17210 Nicotine dependence, cigarettes, uncomplicated: Secondary | ICD-10-CM | POA: Diagnosis present

## 2021-04-14 DIAGNOSIS — R471 Dysarthria and anarthria: Secondary | ICD-10-CM | POA: Diagnosis present

## 2021-04-14 DIAGNOSIS — I6389 Other cerebral infarction: Secondary | ICD-10-CM | POA: Diagnosis not present

## 2021-04-14 DIAGNOSIS — Z6841 Body Mass Index (BMI) 40.0 and over, adult: Secondary | ICD-10-CM | POA: Diagnosis not present

## 2021-04-14 DIAGNOSIS — E875 Hyperkalemia: Secondary | ICD-10-CM | POA: Diagnosis present

## 2021-04-14 DIAGNOSIS — I69351 Hemiplegia and hemiparesis following cerebral infarction affecting right dominant side: Secondary | ICD-10-CM

## 2021-04-14 DIAGNOSIS — R739 Hyperglycemia, unspecified: Secondary | ICD-10-CM

## 2021-04-14 DIAGNOSIS — I4891 Unspecified atrial fibrillation: Secondary | ICD-10-CM | POA: Diagnosis present

## 2021-04-14 DIAGNOSIS — F1410 Cocaine abuse, uncomplicated: Secondary | ICD-10-CM | POA: Diagnosis not present

## 2021-04-14 DIAGNOSIS — Z7982 Long term (current) use of aspirin: Secondary | ICD-10-CM

## 2021-04-14 DIAGNOSIS — K219 Gastro-esophageal reflux disease without esophagitis: Secondary | ICD-10-CM | POA: Diagnosis present

## 2021-04-14 DIAGNOSIS — G4733 Obstructive sleep apnea (adult) (pediatric): Secondary | ICD-10-CM | POA: Diagnosis present

## 2021-04-14 DIAGNOSIS — R1310 Dysphagia, unspecified: Secondary | ICD-10-CM

## 2021-04-14 DIAGNOSIS — F32A Depression, unspecified: Secondary | ICD-10-CM | POA: Diagnosis present

## 2021-04-14 DIAGNOSIS — I480 Paroxysmal atrial fibrillation: Secondary | ICD-10-CM | POA: Diagnosis present

## 2021-04-14 DIAGNOSIS — Z823 Family history of stroke: Secondary | ICD-10-CM | POA: Diagnosis not present

## 2021-04-14 DIAGNOSIS — Z79899 Other long term (current) drug therapy: Secondary | ICD-10-CM

## 2021-04-14 DIAGNOSIS — E1165 Type 2 diabetes mellitus with hyperglycemia: Secondary | ICD-10-CM | POA: Diagnosis present

## 2021-04-14 DIAGNOSIS — R29810 Facial weakness: Secondary | ICD-10-CM | POA: Diagnosis present

## 2021-04-14 DIAGNOSIS — R0902 Hypoxemia: Secondary | ICD-10-CM | POA: Diagnosis present

## 2021-04-14 DIAGNOSIS — Z9119 Patient's noncompliance with other medical treatment and regimen: Secondary | ICD-10-CM

## 2021-04-14 LAB — BASIC METABOLIC PANEL WITH GFR
Anion gap: 7 (ref 5–15)
BUN: 26 mg/dL — ABNORMAL HIGH (ref 6–20)
CO2: 23 mmol/L (ref 22–32)
Calcium: 8.9 mg/dL (ref 8.9–10.3)
Chloride: 106 mmol/L (ref 98–111)
Creatinine, Ser: 1.46 mg/dL — ABNORMAL HIGH (ref 0.61–1.24)
GFR, Estimated: 58 mL/min — ABNORMAL LOW
Glucose, Bld: 242 mg/dL — ABNORMAL HIGH (ref 70–99)
Potassium: 5.6 mmol/L — ABNORMAL HIGH (ref 3.5–5.1)
Sodium: 136 mmol/L (ref 135–145)

## 2021-04-14 LAB — CBC WITH DIFFERENTIAL/PLATELET
Abs Immature Granulocytes: 0.05 10*3/uL (ref 0.00–0.07)
Basophils Absolute: 0.1 10*3/uL (ref 0.0–0.1)
Basophils Relative: 1 %
Eosinophils Absolute: 0.7 10*3/uL — ABNORMAL HIGH (ref 0.0–0.5)
Eosinophils Relative: 7 %
HCT: 38.5 % — ABNORMAL LOW (ref 39.0–52.0)
Hemoglobin: 12.9 g/dL — ABNORMAL LOW (ref 13.0–17.0)
Immature Granulocytes: 1 %
Lymphocytes Relative: 10 %
Lymphs Abs: 1.1 10*3/uL (ref 0.7–4.0)
MCH: 28.6 pg (ref 26.0–34.0)
MCHC: 33.5 g/dL (ref 30.0–36.0)
MCV: 85.4 fL (ref 80.0–100.0)
Monocytes Absolute: 0.9 10*3/uL (ref 0.1–1.0)
Monocytes Relative: 8 %
Neutro Abs: 8 10*3/uL — ABNORMAL HIGH (ref 1.7–7.7)
Neutrophils Relative %: 73 %
Platelets: 277 10*3/uL (ref 150–400)
RBC: 4.51 MIL/uL (ref 4.22–5.81)
RDW: 13.2 % (ref 11.5–15.5)
WBC: 10.8 10*3/uL — ABNORMAL HIGH (ref 4.0–10.5)
nRBC: 0 % (ref 0.0–0.2)

## 2021-04-14 LAB — URINALYSIS, ROUTINE W REFLEX MICROSCOPIC
Bacteria, UA: NONE SEEN
Bilirubin Urine: NEGATIVE
Glucose, UA: NEGATIVE mg/dL
Ketones, ur: NEGATIVE mg/dL
Leukocytes,Ua: NEGATIVE
Nitrite: NEGATIVE
Protein, ur: 100 mg/dL — AB
Specific Gravity, Urine: 1.013 (ref 1.005–1.030)
pH: 5 (ref 5.0–8.0)

## 2021-04-14 LAB — RAPID URINE DRUG SCREEN, HOSP PERFORMED
Amphetamines: NOT DETECTED
Barbiturates: NOT DETECTED
Benzodiazepines: NOT DETECTED
Cocaine: POSITIVE — AB
Opiates: NOT DETECTED
Tetrahydrocannabinol: NOT DETECTED

## 2021-04-14 LAB — RESP PANEL BY RT-PCR (FLU A&B, COVID) ARPGX2
Influenza A by PCR: NEGATIVE
Influenza B by PCR: NEGATIVE
SARS Coronavirus 2 by RT PCR: NEGATIVE

## 2021-04-14 LAB — CBG MONITORING, ED: Glucose-Capillary: 157 mg/dL — ABNORMAL HIGH (ref 70–99)

## 2021-04-14 LAB — BRAIN NATRIURETIC PEPTIDE: B Natriuretic Peptide: 182.8 pg/mL — ABNORMAL HIGH (ref 0.0–100.0)

## 2021-04-14 LAB — POTASSIUM: Potassium: 4.9 mmol/L (ref 3.5–5.1)

## 2021-04-14 IMAGING — CT CT ANGIO HEAD-NECK (W OR W/O PERF)
2 of 11 series · 15 of 47 positions shown · IV contrast (omnipaque)
Comparison: Brain MRI [DATE]

CLINICAL DATA: Swallowing difficulty

EXAM:
CT ANGIOGRAPHY HEAD AND NECK
TECHNIQUE: Multidetector CT imaging of the head and neck was performed using
the standard protocol during bolus administration of intravenous
contrast. Multiplanar CT image reconstructions and MIPs were
obtained to evaluate the vascular anatomy. Carotid stenosis
measurements (when applicable) are obtained utilizing NASCET
criteria, using the distal internal carotid diameter as the
denominator.
CONTRAST:  75mL OMNIPAQUE IOHEXOL 350 MG/ML SOLN

[Series 7: coronal · coronal · 0.36mm/px · 1 of 45 slices shown]
[im 5/45  brain]
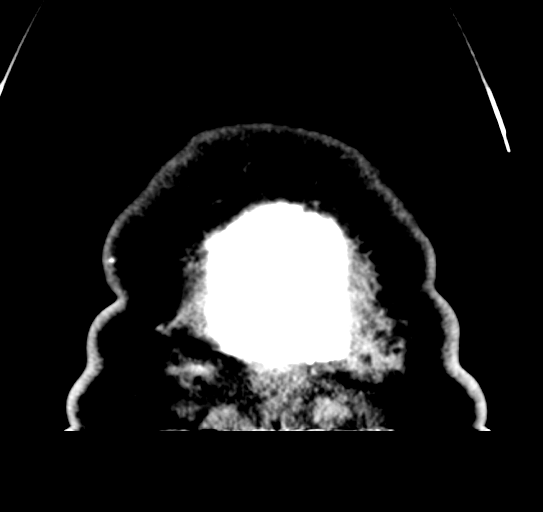

[Series 16: thin · axial · 0.58mm/px · z∈[-403,-70]mm · 14 of 771 slices shown]
[im 52/771  brain]
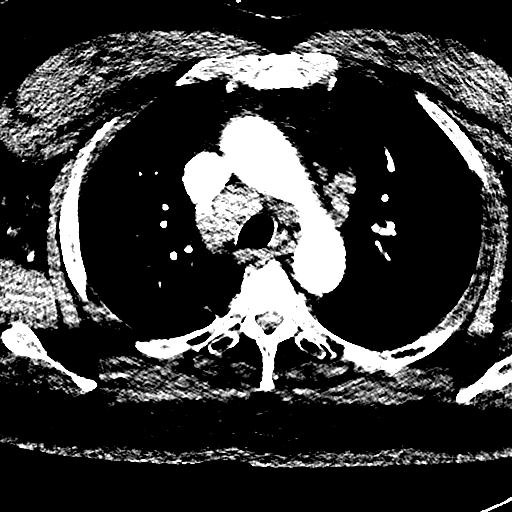
[im 103/771  bone]
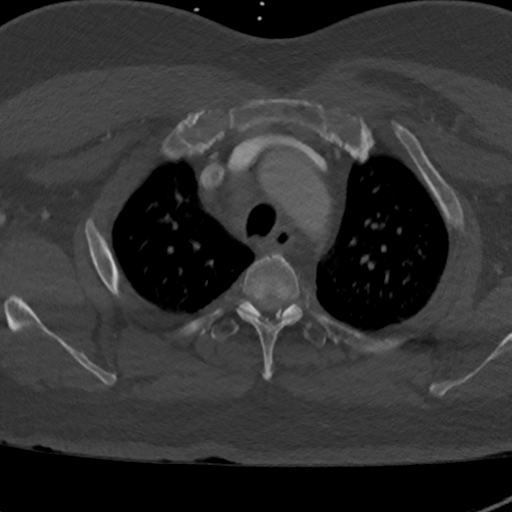
[im 155/771  brain]
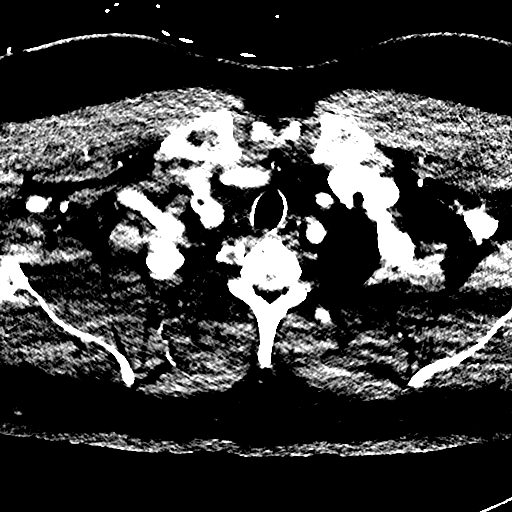
[im 206/771  bone]
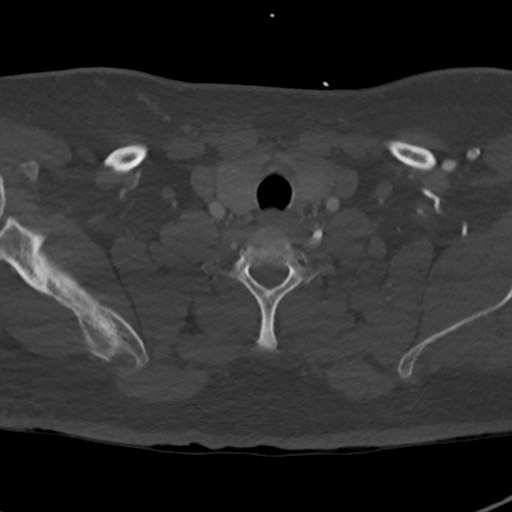
[im 257/771  brain]
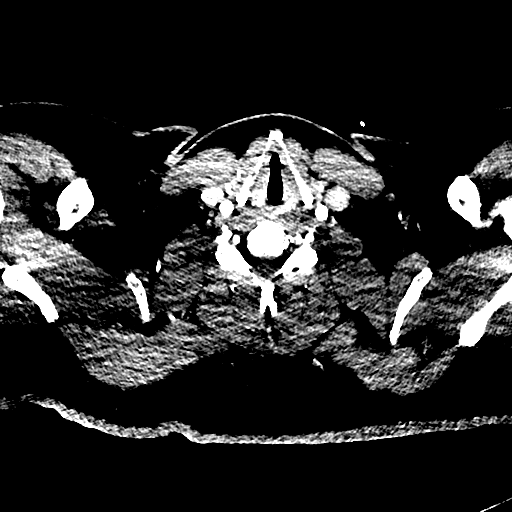
[im 309/771  bone]
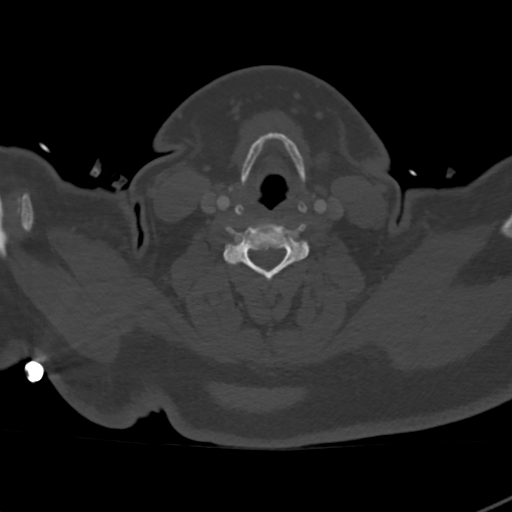
[im 360/771  brain]
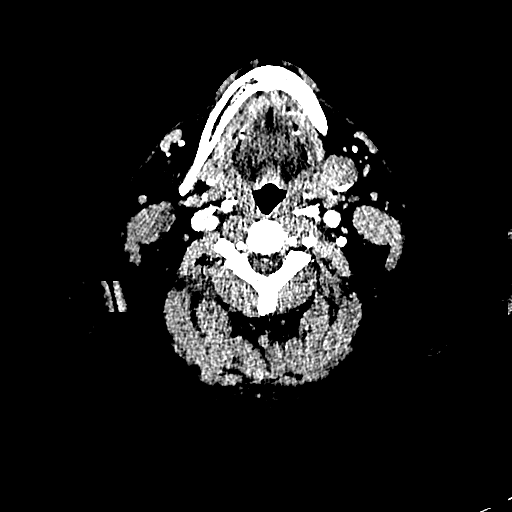
[im 411/771  bone]
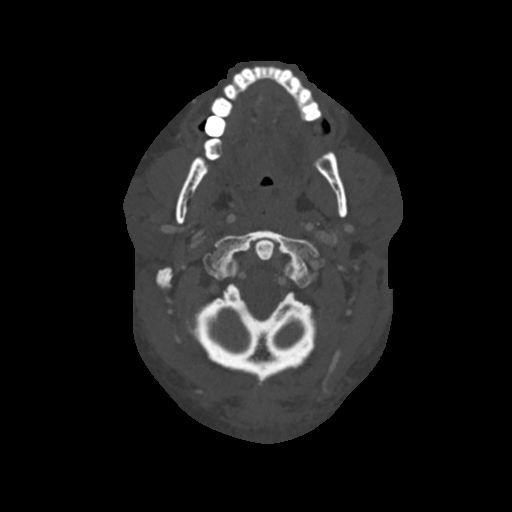
[im 463/771  brain]
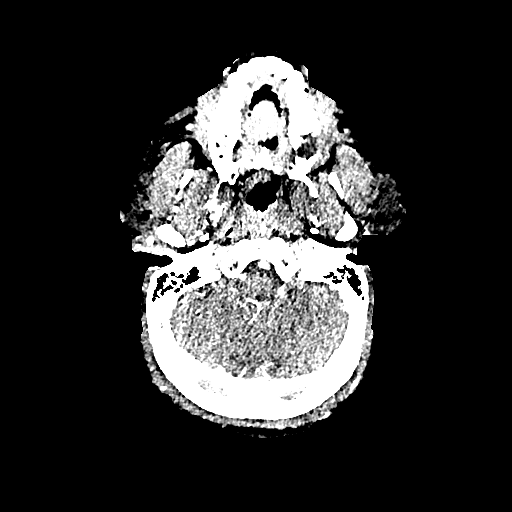
[im 514/771  bone]
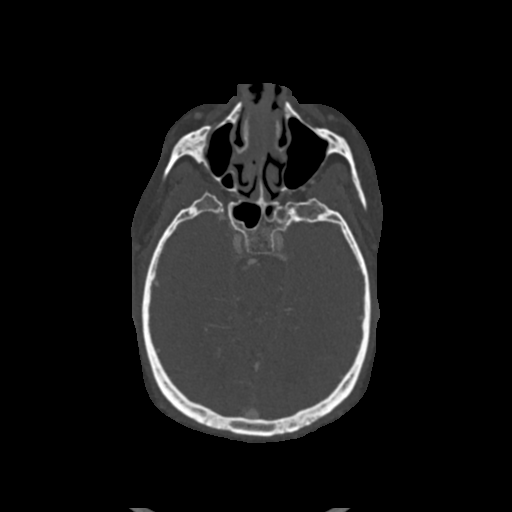
[im 565/771  brain]
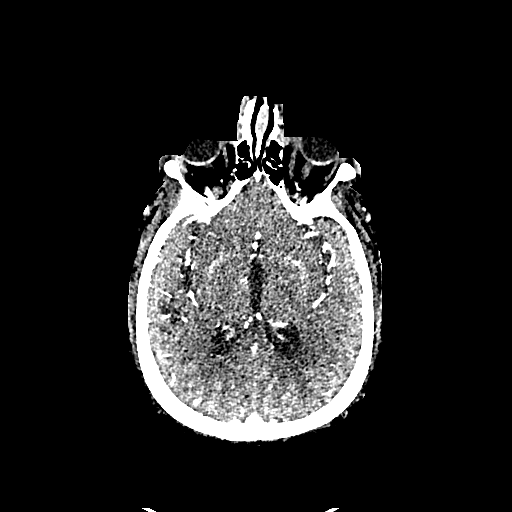
[im 617/771  bone]
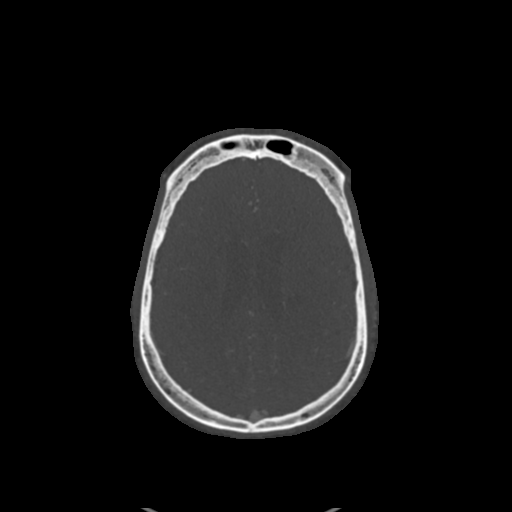
[im 668/771  brain]
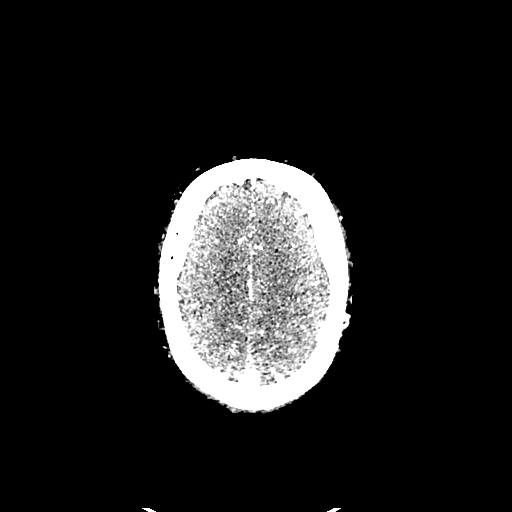
[im 719/771  bone]
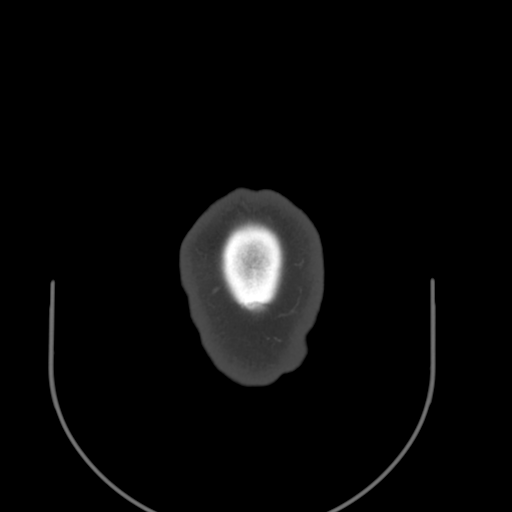

[15 of 47 positions shown; findings below may reference images not displayed]

FINDINGS: CT HEAD FINDINGS

Brain: There are multiple hypodense white matter lesions. The number
of lesions has increased since [DATE]. No acute hemorrhage. No
midline shift or mass effect

Skull: The visualized skull base, calvarium and extracranial soft
tissues are normal.

Sinuses/Orbits: No fluid levels or advanced mucosal thickening of
the visualized paranasal sinuses. No mastoid or middle ear effusion.
The orbits are normal.

CTA NECK FINDINGS

SKELETON: There is no bony spinal canal stenosis. No lytic or
blastic lesion.

OTHER NECK: Normal pharynx, larynx and major salivary glands. No
cervical lymphadenopathy. Unremarkable thyroid gland.

UPPER CHEST: No pneumothorax or pleural effusion. No nodules or
masses.

AORTIC ARCH:

There is no calcific atherosclerosis of the aortic arch. There is no
aneurysm, dissection or hemodynamically significant stenosis of the
visualized portion of the aorta. Normal variant aortic arch
branching pattern with the brachiocephalic and left common carotid
arteries sharing a common origin. The visualized proximal subclavian
arteries are widely patent.

RIGHT CAROTID SYSTEM: Normal without aneurysm, dissection or
stenosis.

LEFT CAROTID SYSTEM: Normal without aneurysm, dissection or
stenosis.

VERTEBRAL ARTERIES: Left dominant configuration. Both origins are
clearly patent. There is no dissection, occlusion or flow-limiting
stenosis to the skull base (V1-V3 segments).

CTA HEAD FINDINGS

POSTERIOR CIRCULATION:

--Vertebral arteries: Mild calcific atherosclerosis.

--Inferior cerebellar arteries: Normal.

--Basilar artery: Normal.

--Superior cerebellar arteries: Normal.

--Posterior cerebral arteries (PCA): Normal.

ANTERIOR CIRCULATION:

--Intracranial internal carotid arteries: Atherosclerotic
calcification of the internal carotid arteries at the skull base
without hemodynamically significant stenosis.

--Anterior cerebral arteries (ACA): Normal. Both A1 segments are
present. Patent anterior communicating artery (a-comm).

--Middle cerebral arteries (MCA): Normal.

VENOUS SINUSES: As permitted by contrast timing, patent.

ANATOMIC VARIANTS: None

Review of the MIP images confirms the above findings.
IMPRESSION: 1. No emergent large vessel occlusion or high-grade stenosis of the
head or neck.
2. Multiple white matter infarcts of mixed ages, including recent
ones demonstrated by MRI.

## 2021-04-14 IMAGING — CR DG CHEST 2V
2 series · 2 of 2 positions shown · non-contrast
Comparison: [DATE].

CLINICAL DATA: Shortness of breath.

EXAM:
CHEST - 2 VIEW

[chest ap]
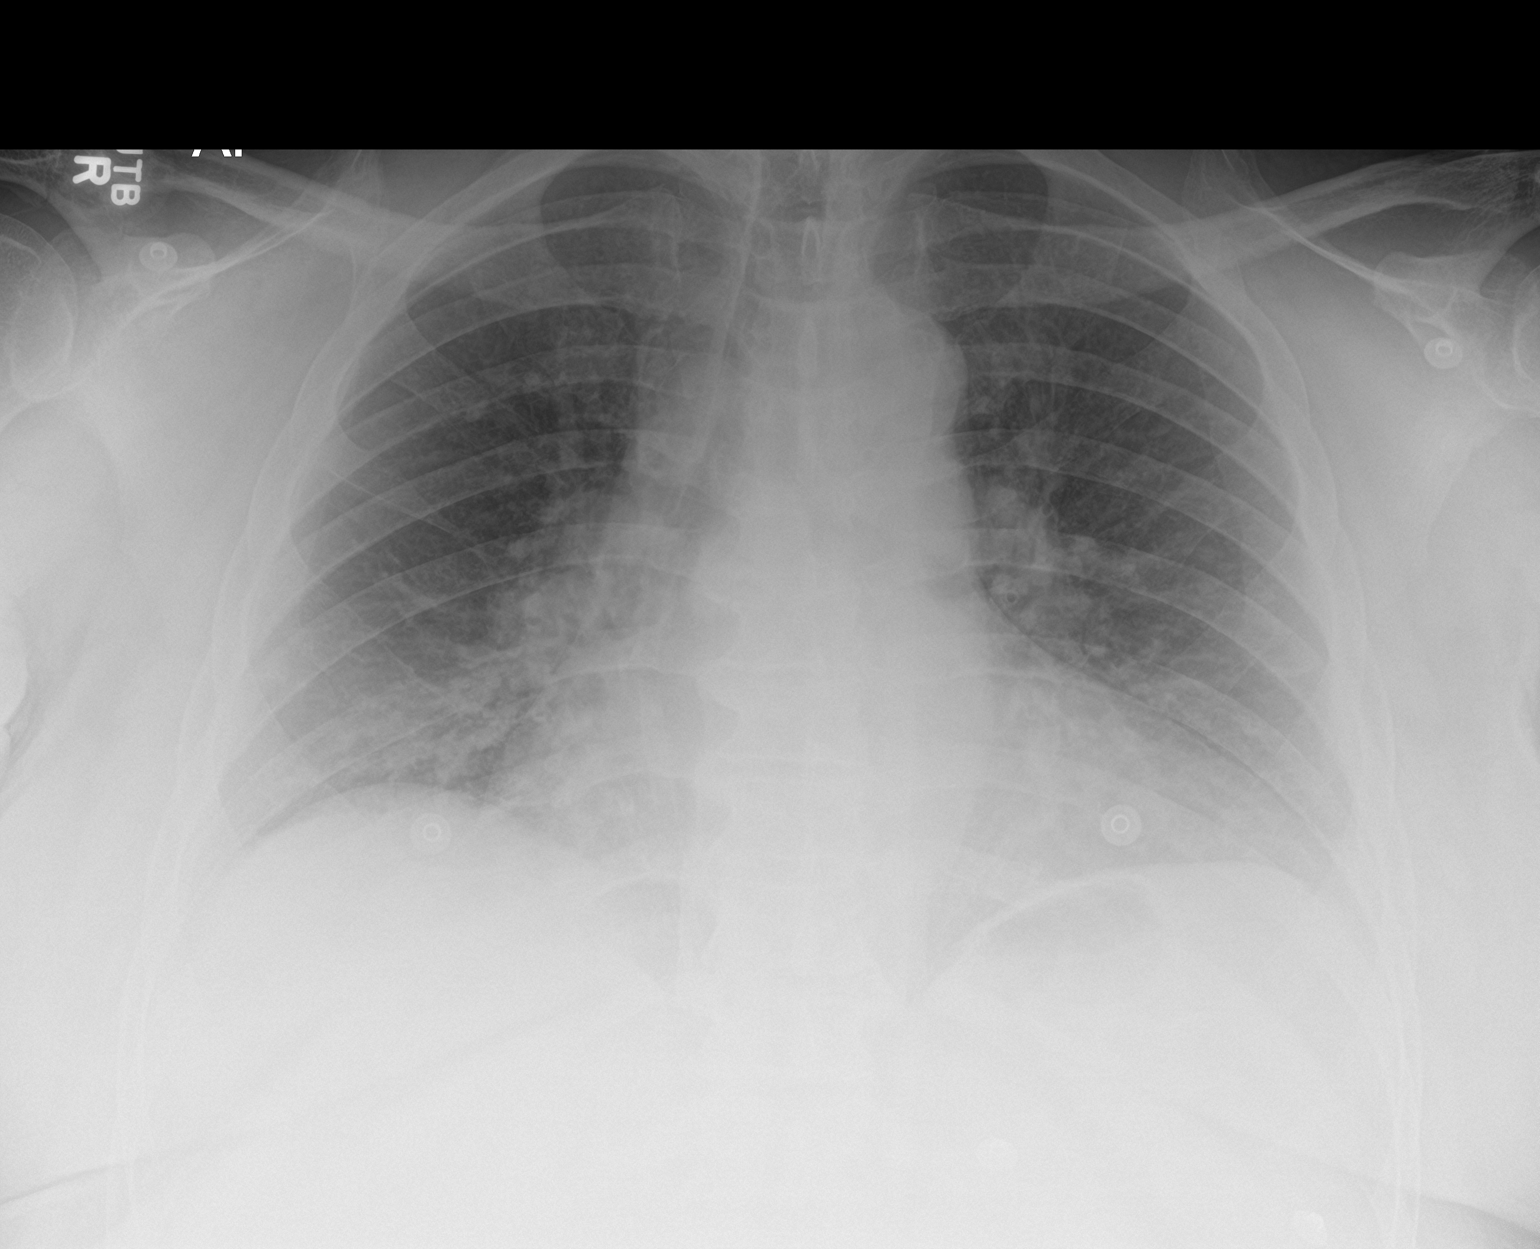

[chest lat]
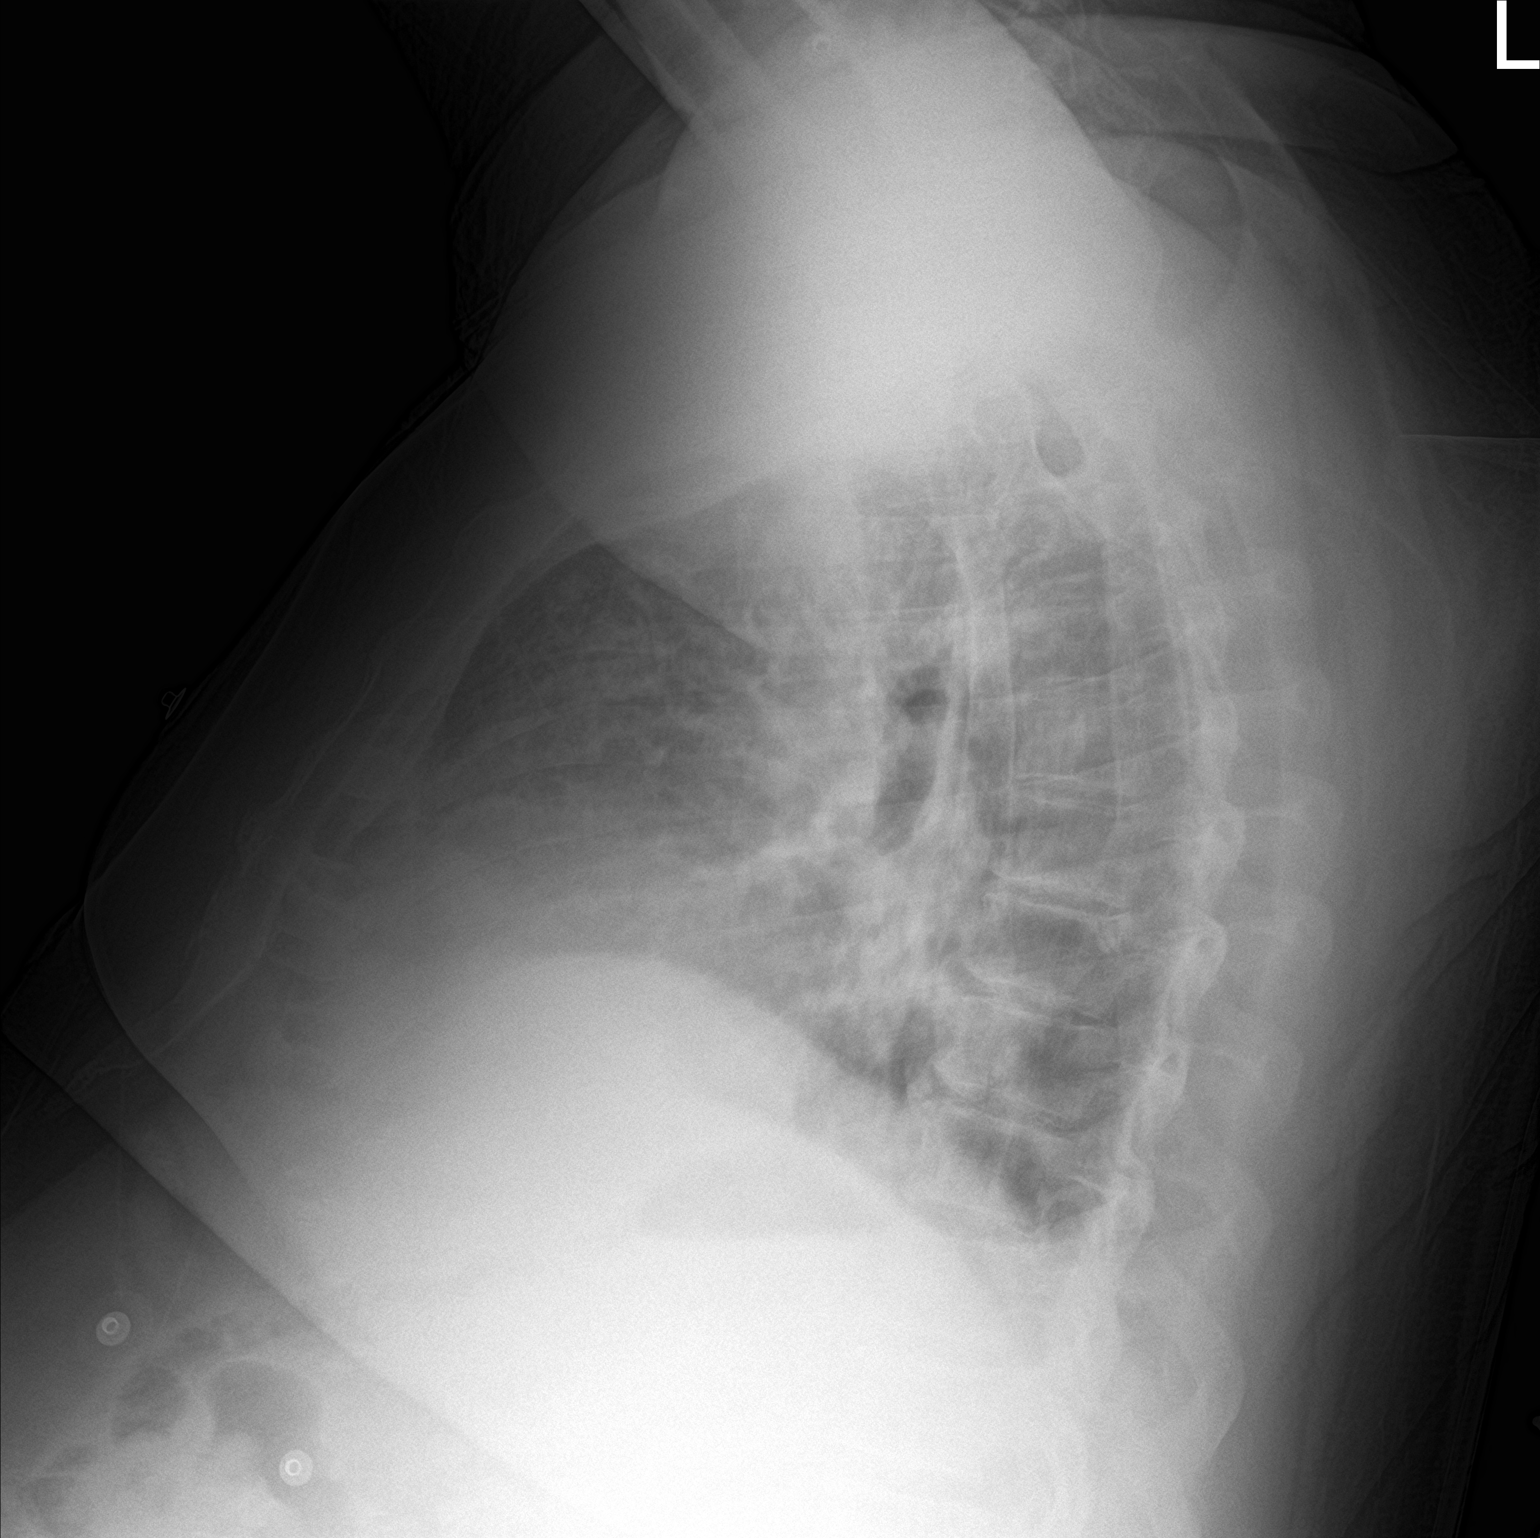

[2 of 2 positions shown; findings below may reference images not displayed]

FINDINGS: Cardiomegaly with mild pulmonary venous congestion and bilateral
interstitial prominence. Findings suggest mild CHF. No prominent
pleural effusion. No pneumothorax. No acute bony abnormality.
IMPRESSION: Cardiomegaly with mild pulmonary venous congestion and bilateral
interstitial prominence suggesting mild CHF.

## 2021-04-14 IMAGING — MR MR HEAD W/O CM
6 of 11 series · 24 of 48 positions shown · non-contrast
Comparison: MRI [DATE].

CLINICAL DATA: Neuro deficit, acute stroke suspected.

EXAM:
MRI HEAD WITHOUT CONTRAST
TECHNIQUE: Multiplanar, multiecho pulse sequences of the brain and surrounding
structures were obtained without intravenous contrast.

[Series 3: DWI · axial · 3.0mm · 0.94mm/px · z∈[-77,+88]mm · 8 of 111 slices shown (1 of 2)]
[im 1/111]
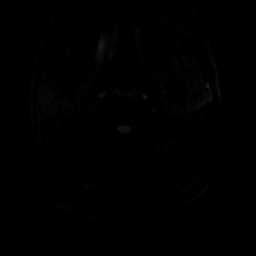
[im 16/111]
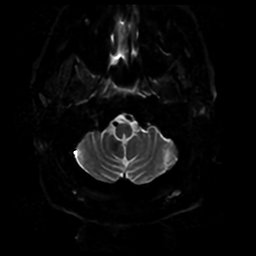
[im 32/111]
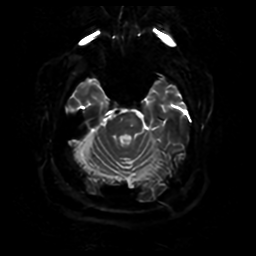
[im 48/111]
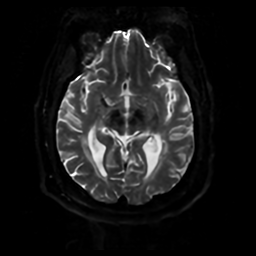
[im 63/111]
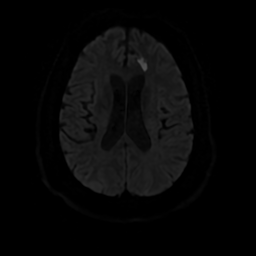
[im 79/111]
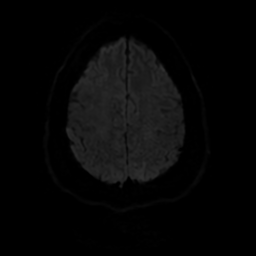
[im 95/111]
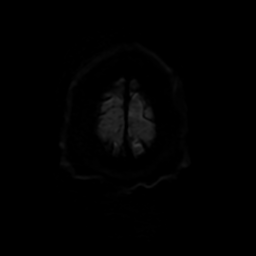
[im 111/111]
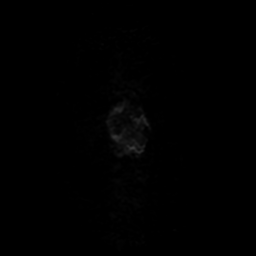

[Series 4: DWI · coronal · 4.0mm · 0.94mm/px · 5 of 76 slices shown (2 of 2)]
[im 1/76]
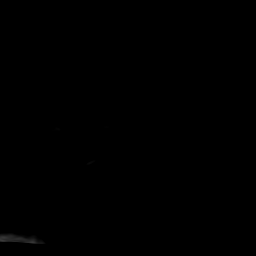
[im 19/76]
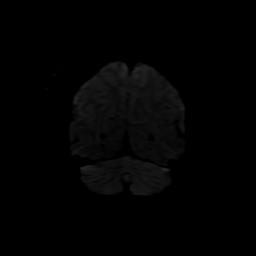
[im 38/76]
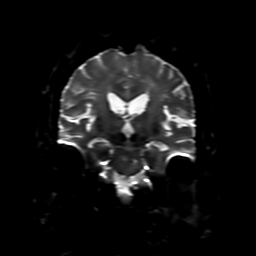
[im 57/76]
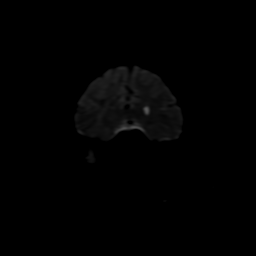
[im 76/76]
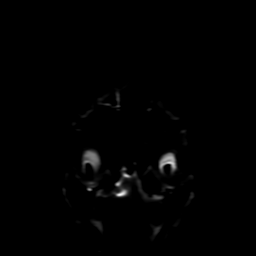

[Series 5: FLAIR · sagittal · 5.0mm · 0.23mm/px · 2 of 29 slices shown (1 of 2)]
[im 1/29]
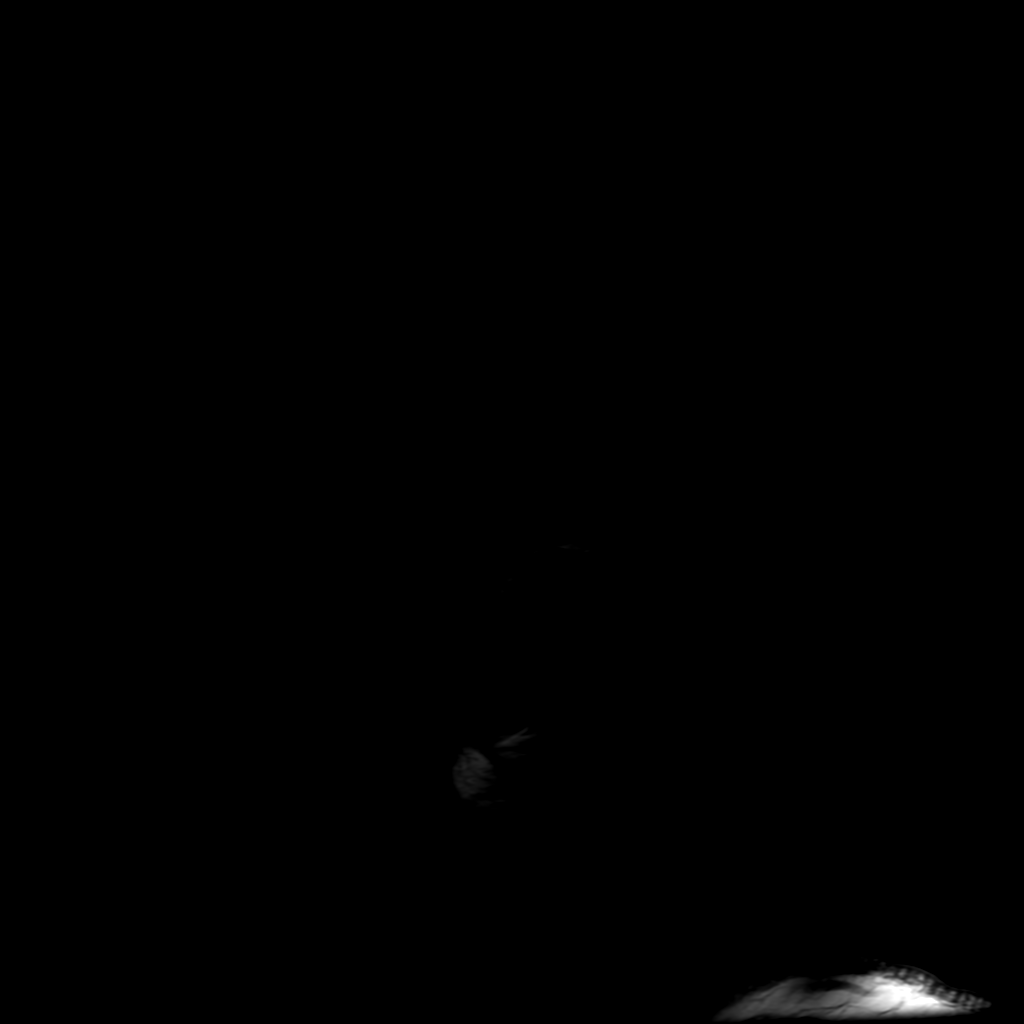
[im 29/29]
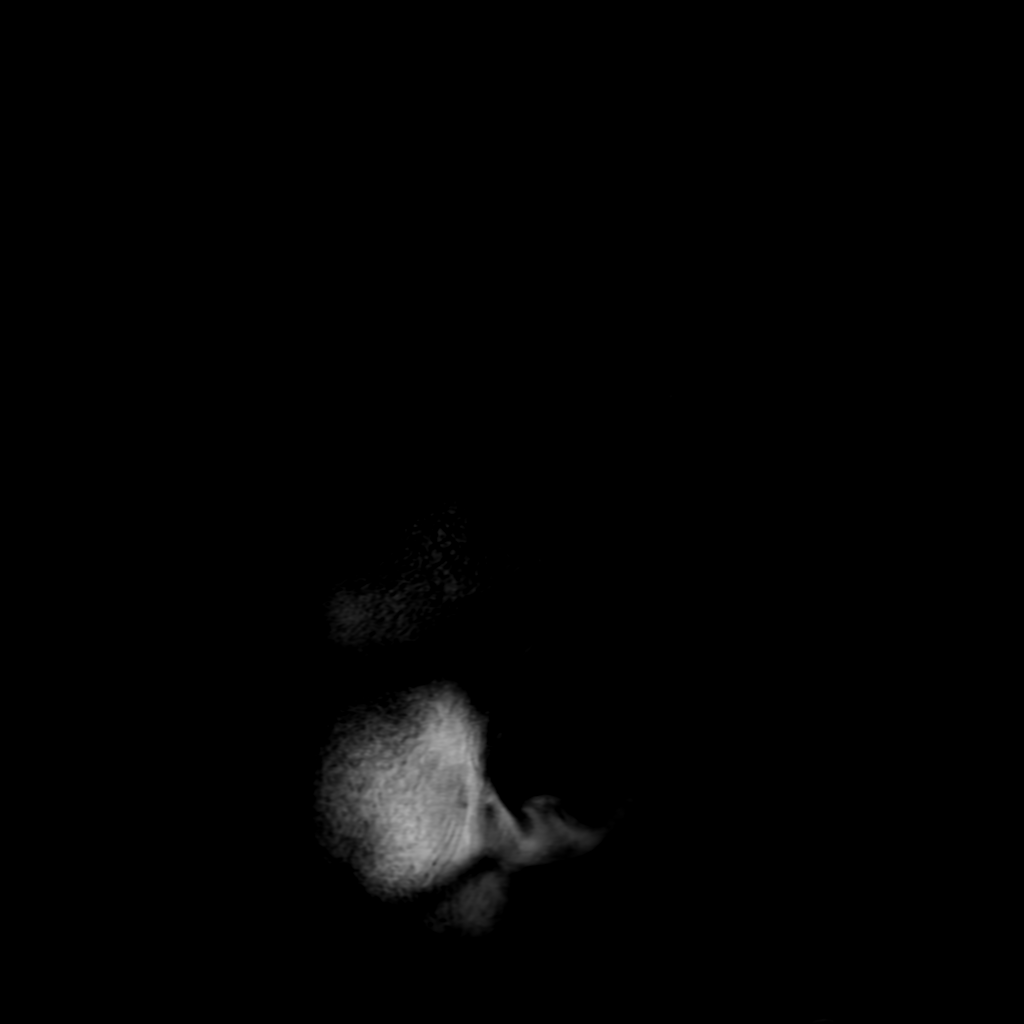

[Series 7: FLAIR · axial · 4.0mm · 0.45mm/px · z∈[-69,+94]mm · 3 of 38 slices shown (2 of 2)]
[im 1/38]
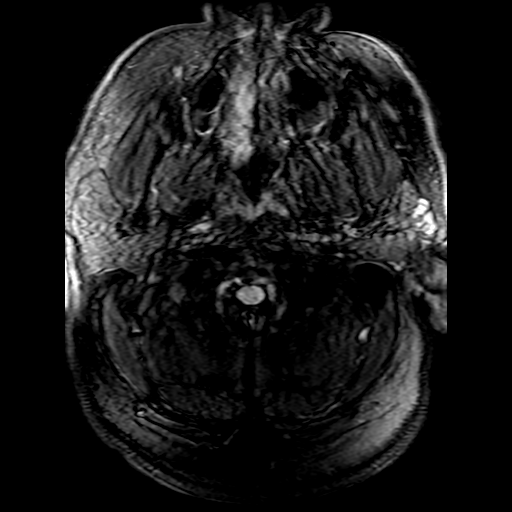
[im 19/38]
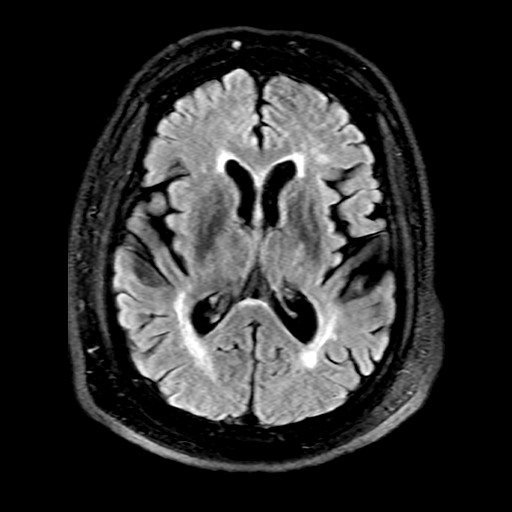
[im 38/38]
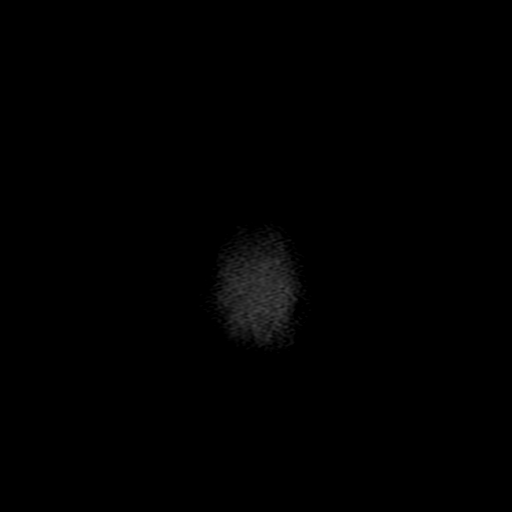

[Series 350: ADC · axial · 3.0mm · 0.94mm/px · z∈[-77,+88]mm · 4 of 55 slices shown (1 of 2)]
[im 1/55]
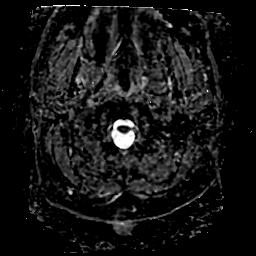
[im 19/55]
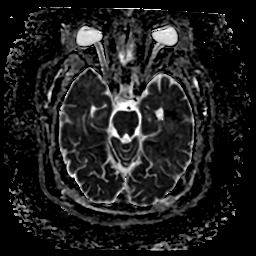
[im 37/55]
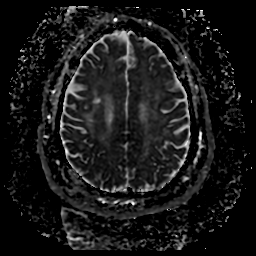
[im 55/55]
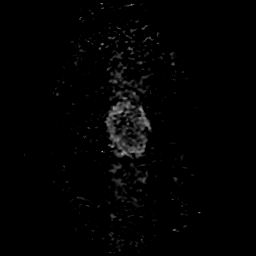

[Series 450: ADC · coronal · 4.0mm · 0.94mm/px · 2 of 35 slices shown (2 of 2)]
[im 1/35]
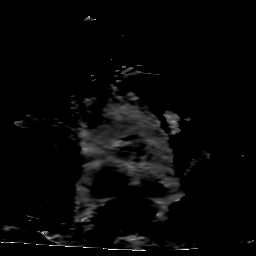
[im 35/35]
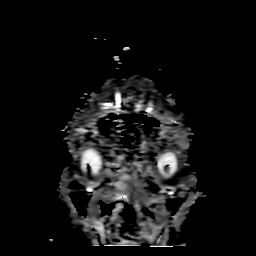

[24 of 48 positions shown; findings below may reference images not displayed]

FINDINGS: Brain: Two small acute infarcts in the pons (series 3, images 15 and
18). Additional acute infarct in the periventricular posterior left
temporal lobe white matter (series 3, images 22 through 24).
Additional acute infarct in the anteromedial left frontal lobe
(series 3, images 30 through 33). Slight associated edema without
mass effect. There are multiple chronic white matter infarcts in
bilateral frontal parietal lobes as well as remote left pontine
infarct. Additional age advanced patchy and confluent T2/FLAIR
hyperintensity within the white matter, most likely related to
chronic microvascular ischemic disease. Age advanced atrophy with ex
vacuo ventricular dilation. No acute hemorrhage, mass lesion,
midline shift, or extra-axial fluid collection.

Vascular: Major arterial flow voids are maintained at the skull
base.

Skull and upper cervical spine: Normal marrow signal.

Sinuses/Orbits: Mild paranasal sinus mucosal thickening. No acute
orbital findings.

Other: No sizable mastoid effusions.
IMPRESSION: 1. Acute infarcts in the pons, posterior left temporal lobe white
matter, and anteromedial left frontal white matter. Slight
associated edema without mass effect. Given involvement of multiple
vascular territories, consider a central embolic etiology.
2. Remote bilateral frontoparietal white matter infarcts and remote
left pontine infarct.
3. Age advanced chronic microvascular ischemic disease.

## 2021-04-14 MED ORDER — STROKE: EARLY STAGES OF RECOVERY BOOK
Freq: Once | Status: DC
  Filled 2021-04-14: qty 1

## 2021-04-14 MED ORDER — INSULIN ASPART 100 UNIT/ML IJ SOLN
0.0000 [IU] | Freq: Four times a day (QID) | INTRAMUSCULAR | Status: DC
  Administered 2021-04-15: 11 [IU] via SUBCUTANEOUS
  Administered 2021-04-15 – 2021-04-16 (×2): 4 [IU] via SUBCUTANEOUS
  Administered 2021-04-16: 7 [IU] via SUBCUTANEOUS
  Administered 2021-04-16: 11 [IU] via SUBCUTANEOUS

## 2021-04-14 MED ORDER — ACETAMINOPHEN 160 MG/5ML PO SOLN: 650.0000 mg | ORAL | Status: DC | PRN

## 2021-04-14 MED ORDER — INSULIN ASPART 100 UNIT/ML IJ SOLN
8.0000 [IU] | Freq: Once | INTRAMUSCULAR | Status: AC
  Administered 2021-04-14: 8 [IU] via SUBCUTANEOUS

## 2021-04-14 MED ORDER — ACETAMINOPHEN 650 MG RE SUPP: 650.0000 mg | RECTAL | Status: DC | PRN

## 2021-04-14 MED ORDER — IOHEXOL 350 MG/ML SOLN
75.0000 mL | Freq: Once | INTRAVENOUS | Status: AC | PRN
  Administered 2021-04-14: 75 mL via INTRAVENOUS

## 2021-04-14 MED ORDER — LORAZEPAM 2 MG/ML IJ SOLN
0.5000 mg | Freq: Once | INTRAMUSCULAR | Status: AC
  Administered 2021-04-14: 0.5 mg via INTRAVENOUS
  Filled 2021-04-14: qty 1

## 2021-04-14 MED ORDER — ACETAMINOPHEN 325 MG PO TABS: 650.0000 mg | ORAL_TABLET | ORAL | Status: DC | PRN

## 2021-04-14 MED ORDER — HEPARIN (PORCINE) 25000 UT/250ML-% IV SOLN
1500.0000 [IU]/h | INTRAVENOUS | Status: DC
  Filled 2021-04-14: qty 250

## 2021-04-14 MED ORDER — ASPIRIN 300 MG RE SUPP
300.0000 mg | Freq: Once | RECTAL | Status: DC
  Filled 2021-04-14: qty 1

## 2021-04-14 MED ORDER — INSULIN ASPART 100 UNIT/ML IJ SOLN: 0.0000 [IU] | INTRAMUSCULAR | Status: DC

## 2021-04-14 MED ORDER — HEPARIN SODIUM (PORCINE) 5000 UNIT/ML IJ SOLN: 5000.0000 [IU] | Freq: Three times a day (TID) | INTRAMUSCULAR | Status: DC

## 2021-04-14 MED ORDER — IPRATROPIUM-ALBUTEROL 0.5-2.5 (3) MG/3ML IN SOLN: 3.0000 mL | RESPIRATORY_TRACT | Status: DC | PRN

## 2021-04-14 MED ORDER — SENNOSIDES-DOCUSATE SODIUM 8.6-50 MG PO TABS
1.0000 | ORAL_TABLET | Freq: Every evening | ORAL | Status: DC | PRN
  Administered 2021-04-15: 1 via ORAL
  Filled 2021-04-14: qty 1

## 2021-04-14 NOTE — Progress Notes (Signed)
ANTICOAGULATION CONSULT NOTE - Initial Consult  Pharmacy Consult for Heparin Indication: atrial fibrillation  Allergies  Allergen Reactions   Shrimp [Shellfish Allergy] Anaphylaxis   Banana Other (See Comments)    Pt gags   Watermelon Flavor Nausea And Vomiting   Other Itching    Grapes cause itching   Almond Oil Itching    Roof of mouth itches   Peanut-Containing Drug Products Itching and Cough   Sulfa Antibiotics Itching    Patient Measurements: Height: 5\' 9"  (175.3 cm) Weight: (!) 142.3 kg (313 lb 11.4 oz) IBW/kg (Calculated) : 70.7 Heparin Dosing Weight: 104.6 kg  Vital Signs: Temp: 97.7 F (36.5 C) (06/30 2049) Temp Source: Oral (06/30 2049) BP: 187/81 (06/30 2021) Pulse Rate: 69 (06/30 2021)  Labs: Recent Labs    04/14/21 1048  HGB 12.9*  HCT 38.5*  PLT 277  CREATININE 1.46*    Estimated Creatinine Clearance: 85 mL/min (A) (by C-G formula based on SCr of 1.46 mg/dL (H)).   Medical History: Past Medical History:  Diagnosis Date   Acute pancreatitis 01/22/2019   Diabetes mellitus    GERD (gastroesophageal reflux disease)    History of alcohol abuse    History of cocaine use    History of echocardiogram    a. Echo 4/14: Moderate LVH, vigorous LVEF, EF 65-70%, normal wall motion, grade 2 diastolic dysfunction, mildly dilated aortic root and ascending aorta, ascending aorta 40 mm, aortic root 38 mm, mild LAE   Hx of cardiovascular stress test    a. GXT 5/14: No ischemic changes  //  b. ETT-Myoview 3/16:  Low risk, no ischemia, EF 58%   Hyperlipidemia    Hypertension    Morbid obesity ([facility])    Paroxysmal atrial fibrillation ([facility])    Occurring in 2008, with several recurrence since then (including in the setting of + cocaine on UDS).   Sleep apnea    uses CPAP   SVT (supraventricular tachycardia) ([facility])    mid to long RP SVT 09/2019    Medications:  (Not in a hospital admission)  Scheduled:    stroke: mapping our early stages of recovery book   Does  not apply Once   aspirin  300 mg Rectal Once   [START ON 04/15/2021] insulin aspart  0-20 Units Subcutaneous Q6H   Infusions:   heparin      Assessment: 50 yom presenting with dysphagia. Patient has a history of A. fib on Eliquis, OSA on CPAP, hypertension, type 2 diabetes, polysubstance use disorder, narcolepsy, and past CVA.  Patient on eliquis last dose 04/14/21 at 0600.  Goal of Therapy:  Heparin level 0.3-0.7 units/ml aPTT 66-102 seconds Monitor platelets by anticoagulation protocol: Yes   Plan:  Start heparin infusion at 1500 units/hr Baseline PTT and Heparin level ordered Check PTT and Heparin level in 6-8 hours Monitor using PTT until heparin level correlates with PTT  04/16/21, PharmD, BCPS 04/14/2021 10:25 PM ED Clinical Pharmacist -  [phone]

## 2021-04-14 NOTE — H&P (Addendum)
Date: 04/14/2021               Patient Name:  [name]. MRN: [id number]  DOB: 05/08/1971 Age / Sex: 50 y.o., male   PCP: Evlyn Kanner, MD         Medical Service: Internal Medicine Teaching Service         Attending Physician: Dr. Gust Rung, DO    First Contact: Dr. Sharene Butters Pager: [phone]  Second Contact: Dr. Barbaraann Faster Pager: [phone]       After Hours (After 5p/  First Contact Pager: [phone]  weekends / holidays): Second Contact Pager: [phone]   Chief Complaint: Dysphagia  History of Present Illness:  [name] is a 50 year old male with past medical history of A. fib on Eliquis, OSA on CPAP, hypertension, type 2 diabetes, polysubstance use disorder, narcolepsy, past CVA, who presented to the ED for dysphagia.  Patient was in his usual state of health until yesterday afternoon where he choked on drinking orange juice.  The next morning, patient noticed worsening dysphagia which he states that food took a long time to clear up.  States that the dysphagia lasted all day.  Also states that he " could not breathe normally" which prompted him to go to the ED. he denies any new focal neurological deficit.  Patient has baseline right-sided weakness from last CVA.  Denies headache, change in vision, recent fall, chest pain, shortness of breath, potation, abdominal pain, nausea, vomiting, leg swelling, orthopnea.  Endorses constipation.  Patient say that he does not miss any dose of his Eliquis, hypertension or cholesterol medication.  Patient is not using CPAP every night because he has some issue with the right setting of the machine.  He has appointment in July to adjust to the setting.  Patient had an acute CVA in February. MRI brain showed acute infarct in the left pons, R lateral splenium of the corpus callosum, medical left temporal lobe/hippocampus, thought is due to microembolic disease.  MRA head and neck did not show any large or medium vessel  disease.  In the ED, code stroke was called.  MRI brain showed acute infarct in the pons, posterior left temporal lobe, and anteromedial left frontal.  There was slight associated edema without mass-effect.  tPA was not given.  Neurology was consulted.  Patient failed the bedside swallow test.  Meds:  No outpatient medications have been marked as taking for the 04/14/21 encounter [facility] Encounter).     Allergies: Allergies as of 04/14/2021 - Review Complete 04/14/2021  Allergen Reaction Noted   Shrimp [shellfish allergy] Anaphylaxis 05/11/2012   Banana Other (See Comments) 05/05/2013   Watermelon flavor Nausea And Vomiting 05/11/2012   Other Itching 06/24/2014   Almond oil Itching 10/29/2013   Peanut-containing drug products Itching and Cough 04/07/2016   Sulfa antibiotics Itching 05/13/2012   Past Medical History:  Diagnosis Date   Acute pancreatitis 01/22/2019   Diabetes mellitus    GERD (gastroesophageal reflux disease)    History of alcohol abuse    History of cocaine use    History of echocardiogram    a. Echo 4/14: Moderate LVH, vigorous LVEF, EF 65-70%, normal wall motion, grade 2 diastolic dysfunction, mildly dilated aortic root and ascending aorta, ascending aorta 40 mm, aortic root 38 mm, mild LAE   Hx of cardiovascular stress test    a. GXT 5/14: No ischemic changes  //  b. ETT-Myoview 3/16:  Low risk, no ischemia, EF 58%  Hyperlipidemia    Hypertension    Morbid obesity ([place])    Paroxysmal atrial fibrillation ([place])    Occurring in 2008, with several recurrence since then (including in the setting of + cocaine on UDS).   Sleep apnea    uses CPAP   SVT (supraventricular tachycardia) ([place])    mid to long RP SVT 09/2019    Family History:  Family History  Problem Relation Age of Onset   Diabetes Mother    Heart attack Father 82   Stroke Maternal Grandmother    Stroke Paternal Grandmother    Diabetes Paternal Grandfather      Social History:  Lives at home  with sister.  Requires some assistance with ADLs Only drinks alcohol socially Smoke 1 pack a week for many years Denies illicit drug use  Review of Systems: A complete ROS was negative except as per HPI.   Physical Exam: Blood pressure (!) 187/81, pulse 69, temperature 97.7 F (36.5 C), temperature source Oral, resp. rate 18, SpO2 98 %. Physical Exam Constitutional:      General: He is not in acute distress.    Appearance: He is not ill-appearing or toxic-appearing.  HENT:     Head: Normocephalic and atraumatic.     Mouth/Throat:     Mouth: Mucous membranes are moist.     Pharynx: No oropharyngeal exudate or posterior oropharyngeal erythema.  Eyes:     General: No scleral icterus.       Right eye: No discharge.        Left eye: No discharge.     Extraocular Movements: Extraocular movements intact.     Conjunctiva/sclera: Conjunctivae normal.     Pupils: Pupils are equal, round, and reactive to light.  Cardiovascular:     Rate and Rhythm: Normal rate and regular rhythm.     Heart sounds: Normal heart sounds. No murmur heard.    Comments: Trace LE edema.  No JVD Pulmonary:     Effort: Pulmonary effort is normal. No respiratory distress.     Breath sounds: No stridor.     Comments: Mild expiratory wheezes in the upper lung bilaterally Abdominal:     General: Bowel sounds are normal. There is no distension.     Palpations: Abdomen is soft.     Tenderness: There is no abdominal tenderness.  Musculoskeletal:        General: No signs of injury. Normal range of motion.     Cervical back: Normal range of motion.  Skin:    General: Skin is warm.     Coloration: Skin is not jaundiced.  Neurological:     Mental Status: He is alert and oriented to person, place, and time.     Comments: PERRLA Intact EOM.  No visual fields deficit No cranial nerve deficit except for 9 and 10 responsible for dysphagia Mild dysarthria  Very subtle weakness noticed on the right upper extremity.   Strength still 5/5, normal ROM and intact sensation. Left UE strength 5/5, normal ROM and intact sensation Bilateral LE strength 5/5, normal ROM and intact sensation No pronator drift.  Slightly lagging of finger-to-nose test.     Psychiatric:        Mood and Affect: Mood normal.        Thought Content: Thought content normal.        Judgment: Judgment normal.     EKG: personally reviewed my interpretation is normal sinus rhythm.  There are peaked T waves V1-V4 but appear better  than last EKG.  CXR: personally reviewed my interpretation is cardiomegaly.  Mild pulmonary vascular congestion  Brain MRI IMPRESSION: 1. Acute infarcts in the pons, posterior left temporal lobe white matter, and anteromedial left frontal white matter. Slight associated edema without mass effect. Given involvement of multiple vascular territories, consider a central embolic etiology. 2. Remote bilateral frontoparietal white matter infarcts and remote left pontine infarct. 3. Age advanced chronic microvascular ischemic disease  Assessment & Plan by Problem: Principal Problem:   CVA (cerebral vascular accident) ([place]) Active Problems:   Dyslipidemia   Chronic diastolic heart failure ([place])   Paroxysmal atrial fibrillation ([place])   Diabetes mellitus ([place])   Hypertension  [name] is a 50 year old male with past medical history of A. fib on Eliquis, OSA on CPAP, hypertension, type 2 diabetes, polysubstance use disorder, narcolepsy, past CVA, who presented to the ED for dysphagia, found to have acute infarct in multiple areas, suspecting embolic stroke.  Question if patient failed Eliquis therapy for his A. fib.  Stroke work up.    Acute CVA MRI brain showed acute infarct in the pons, posterior left temporal lobe, and anteromedial left frontal, suspecting embolic stroke.  Patient has A. fib on Eliquis and report compliance with medication.  Question if he fails Eliquis.  Patient has failed Xarelto in the  past; may switch to warfarin.  Other risk factors include HTN, DM, HLD and smoking. Stroke work-up per neurology. -Greatly appreciate neurology recommendation -TTE.  Patient may need a TEE to rule out cardiac source of emboli -Work up for hypercoagulable state with ESR. Malignancy is also a possibility with his smoking history. RPR to rule out syphilis.  -ASA 300 mg rectally -CTA head and neck -Telemetry -Goal BP normotensive. If significantly elevated, can give IV medication PRN  -Lipid panel.  Currently on atorvastatin 80 mg, if LDL > 70, may need to add Zetia -Failed bedside swallow.  Formal SLP in a.m. Currently n.p.o. -PT/OT -Frequent neurochecks  Atrial fibrillation s/p ablation 11/2019 CHADSVAS score 4 Rhythm controlled with metoprolol and flecainide.  Anticoagulate with Eliquis.  There is a question if patient failed Eliquis given his new embolic stroke.  He has failed Xarelto in the past.   -Currently rhythm controlled.  Holding home meds for because of n.p.o. -Holding anticoagulation due to risk of hemorraghic conversion   HFpEF Echo in 11/2020 EF 60-65%. BNP is elevated 182 and CXR showed vascular congestion.  However patient does not appear volume overloaded on exam.  He is breathing well on room air.  Weight also at baseline (313 lbs). Given stable respiratory status and elevated sCr, hold off on diuresis.   Elevated creatinine Hyperkalemia Serum creatinine 1.46, slightly elevated from baseline of 1.3.  Initial potassium was 5.6.  EKG showed peaked T waves but appear improved from prior EKG.  Patient received 8 units of NovoLog in the ED, repeat potassium of 4.9.   Per chart review, patient had hyperkalemia in the past requiring Lokelma because he was on spironolactone and lisinopril.  Patient was instructed to stop this medication and was started on Olmesartan-amlodipine-HCTZ.  Patient confirms taking new medication. His hyperkalemia can be 2/2 kidney dysfunction. Recheck K in  AM.  -BMP in a.m.  Hypertension -Holding home meds until SLP  Hyperlipidemia -Lipid panel  Type 2 diabetes A1c of 8.3 in May.  Home regimen: Metformin, Trulicity. -Sliding scale every 6 hour -Monitor CBG  Narcolepsy Holding Adderall and modafinil due to n.p.o. status  Full code IVF: N/A Diet:  N.p.o. DVT: SCD  Dispo: Admit patient to Inpatient with expected length of stay greater than 2 midnights.  Signed: Gaylan Gerold, DO 04/14/2021, 9:00 PM  Pager: [phone] After 5pm on weekdays and 1pm on weekends: On Call pager: [phone]

## 2021-04-14 NOTE — ED Provider Notes (Signed)
[facility] EMERGENCY DEPARTMENT Provider Note   CSN: [id number] Arrival date & time: 04/14/21  8182     History Chief Complaint  Patient presents with   Dysphagia    [name]. is a 50 y.o. male with a past medical history significant for diabetes, GERD, history of alcohol abuse, history of cocaine use, hyperlipidemia, hypertension, A. fib on chronic Eliquis, history of CVA who presents to the ED due to worsening dysphagia.  Patient states he has had dysphagia ever since his CVA in February 2022; however, it worsened yesterday. He notes he has had 2 "choking episodes" while drinking liquids yesterday and today that lasted longer than his typical spells which prompted him to report to the ED for further evaluation. He notes he was able to eat solids yesterday without any issues. No solids today. Dysphagia associated with shortness of breath only when he is "choking". Denies associated chest pain or lower extremity edema. He states he has had a swallow test in the past after his CVA which he notes was normal. Denies sore throat. He notes he felt like he was coming down with a "cold" recently. Denies foreign body sensation in throat. Denies nausea, vomiting, and abdominal pain. When EMS arrived to patient's house, he was having a coughing spell where he was found to be hypoxic at 86% that quickly revolved with 2L [place].  Patient denies speech changes, visual changes, and worsening unilateral weakness.  Of note, patient has residual right-sided weakness from previous CVA which he notes has not changed from his baseline.  History obtained from patient and past medical records. No interpreter used during encounter.       Past Medical History:  Diagnosis Date   Acute pancreatitis 01/22/2019   Diabetes mellitus    GERD (gastroesophageal reflux disease)    History of alcohol abuse    History of cocaine use    History of echocardiogram    a. Echo 4/14: Moderate LVH,  vigorous LVEF, EF 65-70%, normal wall motion, grade 2 diastolic dysfunction, mildly dilated aortic root and ascending aorta, ascending aorta 40 mm, aortic root 38 mm, mild LAE   Hx of cardiovascular stress test    a. GXT 5/14: No ischemic changes  //  b. ETT-Myoview 3/16:  Low risk, no ischemia, EF 58%   Hyperlipidemia    Hypertension    Morbid obesity ([place])    Paroxysmal atrial fibrillation ([place])    Occurring in 2008, with several recurrence since then (including in the setting of + cocaine on UDS).   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 ([place]) 03/15/2021   Hypertensive retinopathy of both eyes, grade 2 03/15/2021   Healthcare maintenance 02/23/2021   Dizziness 12/30/2020   Cerebral thrombosis with cerebral infarction 11/20/2020   Right sided weakness 11/19/2020   Hyperkalemia 11/19/2020   AKI (acute kidney injury) ([place])    Dysarthria    Amnesia 09/08/2020   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   Acute cerebrovascular accident (CVA) ([place]) 06/01/2020   Paronychia of right thumb 05/10/2020   Mediastinal adenopathy 01/06/2020   Secondary hypercoagulable state ([place]) 10/22/2019   Morbid obesity due to excess calories ([place]) 12/08/2016   Paroxysmal atrial fibrillation ([place]) 01/23/2013   Diabetes mellitus ([place]) 01/23/2013   Hypertension 01/23/2013  Tobacco user 01/23/2013   Chronic diastolic heart failure ([place]) 01/21/2013   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   Shoulder pain, right 02/15/2011   Seasonal and perennial allergic rhinitis 02/13/2008   Dyslipidemia 02/12/2008   Cocaine abuse ([place]) 02/12/2008   Obstructive sleep apnea 02/12/2008    Past Surgical History:  Procedure Laterality Date   APPENDECTOMY     ATRIAL FIBRILLATION ABLATION N/A 11/27/2019   Procedure: ATRIAL FIBRILLATION  ABLATION;  Surgeon: Thompson Grayer, MD;  Location: [facility];  Service: Cardiovascular;  Laterality: N/A;   ROTATOR CUFF REPAIR     SVT ABLATION N/A 11/27/2019   Procedure: SVT ABLATION;  Surgeon: Thompson Grayer, MD;  Location: [facility];  Service: Cardiovascular;  Laterality: N/A;   TONSILLECTOMY         Family History  Problem Relation Age of Onset   Diabetes Mother    Heart attack Father 75   Stroke Maternal Grandmother    Stroke Paternal Grandmother    Diabetes Paternal Grandfather     Social History   Tobacco Use   Smoking status: Every Day    Packs/day: 0.10    Years: 28.00    Pack years: 2.80    Types: Cigarettes   Smokeless tobacco: Former    Types: Snuff   Tobacco comments:    1 pack per week   Vaping Use   Vaping Use: Never used  Substance Use Topics   Alcohol use: Not Currently    Alcohol/week: 6.0 standard drinks    Types: 6 Standard drinks or equivalent per week    Comment: monthly   Drug use: Not Currently    Comment: cocaine in the past, none currently    Home Medications Prior to Admission medications   Medication Sig Start Date End Date Taking? Authorizing Provider  Accu-Chek Softclix Lancets lancets TEST UP TO 4 TIMES A DAY 02/02/21   Mosetta Anis, MD  amphetamine-dextroamphetamine (ADDERALL XR) 30 MG 24 hr capsule Take 1 capsule (30 mg total) by mouth daily as needed (focus). 1 daily as needed 03/28/21   Baird Lyons D, MD  apixaban (ELIQUIS) 5 MG TABS tablet Take 1 tablet (5 mg total) by mouth 2 (two) times daily. 02/23/21   Katsadouros, Vasilios, MD  atorvastatin (LIPITOR) 80 MG tablet Take 1 tablet (80 mg total) by mouth daily. 02/23/21   Riesa Pope, MD  blood glucose meter kit and supplies KIT Dispense based on patient and insurance preference. Use up to four times daily as directed. 12/29/20   [name], MD  Blood Pressure Monitoring (BLOOD PRESSURE MONITOR/L CUFF) MISC 1 Units by Does not apply route  daily. Patient not taking: Reported on 03/04/2021 03/19/19   Suella Broad A, PA-C  Dulaglutide (TRULICITY) 1.5 BZ/1.6RC SOPN Inject 1.5 mg into the skin once a week. 02/23/21   Riesa Pope, MD  flecainide (TAMBOCOR) 100 MG tablet Take 1 tablet (100 mg total) by mouth 2 (two) times daily. Please make yearly appt with Dr. Rayann Heman for June 2022 before anymore refills. Thank you 1st attempt 03/08/21   Thompson Grayer, MD  lactose free nutrition (BOOST) LIQD Take 237 mLs by mouth daily.    [provider]  metFORMIN (GLUCOPHAGE) 500 MG tablet Take 1,000 mg by mouth 2 (two) times daily with a meal.  01/06/19   [provider]  metoprolol succinate (TOPROL XL) 100 MG 24 hr tablet Take 1 tablet (100 mg total) by mouth daily. Take with or immediately following a meal.  02/23/21 02/23/22  Riesa Pope, MD  modafinil (PROVIGIL) 200 MG tablet Take 1 tablet (200 mg total) by mouth daily. 03/01/21   Baird Lyons D, MD  nicotine (NICODERM CQ - DOSED IN MG/24 HOURS) 21 mg/24hr patch PLACE 1 PATCH (21 MG TOTAL) ONTO THE SKIN DAILY. 12/20/20   Gaylan Gerold, DO  Olmesartan-amLODIPine-HCTZ 40-10-25 MG TABS Take 1 tablet by mouth daily. 02/23/21   Katsadouros, Vasilios, MD  pantoprazole (PROTONIX) 40 MG tablet Take 1 tablet (40 mg total) by mouth daily. 12/29/20   [name], MD    Allergies    Shrimp [shellfish allergy], Banana, Watermelon flavor, Other, Almond oil, Peanut-containing drug products, and Sulfa antibiotics  Review of Systems   Review of Systems  Constitutional:  Negative for chills and fever.  HENT:  Positive for trouble swallowing. Negative for sore throat.   Respiratory:  Positive for cough and shortness of breath.   Cardiovascular:  Negative for chest pain and leg swelling.  Gastrointestinal:  Negative for nausea and vomiting.  All other systems reviewed and are negative.  Physical Exam Updated Vital Signs BP (!) 157/96 (BP Location: Right Arm)   Pulse 65    Temp 98.7 F (37.1 C) (Oral)   Resp 18   SpO2 97%   Physical Exam Vitals and nursing note reviewed.  Constitutional:      General: He is not in acute distress.    Appearance: He is not ill-appearing.  HENT:     Head: Normocephalic.     Mouth/Throat:     Comments: Airway patent without erythema or edema.  Eyes:     Pupils: Pupils are equal, round, and reactive to light.  Cardiovascular:     Rate and Rhythm: Normal rate and regular rhythm.     Pulses: Normal pulses.     Heart sounds: Normal heart sounds. No murmur heard.   No friction rub. No gallop.  Pulmonary:     Effort: Pulmonary effort is normal.     Breath sounds: Normal breath sounds.     Comments: Respirations equal and unlabored, patient able to speak in full sentences, lungs clear to auscultation bilaterally Abdominal:     General: Abdomen is flat. There is no distension.     Palpations: Abdomen is soft.     Tenderness: There is no abdominal tenderness. There is no guarding or rebound.  Musculoskeletal:        General: Normal range of motion.     Cervical back: Neck supple.     Comments: No lower extremity edema.   Skin:    General: Skin is warm and dry.  Neurological:     General: No focal deficit present.     Mental Status: He is alert.     Comments: Speech is clear, able to follow commands CN III-XII intact Normal strength in upper and lower extremities on left, slight decreased in strength on right which is baseline for patient from previous CVA Sensation grossly intact throughout Moves extremities without ataxia, coordination intact No pronator drift Ambulates without difficulty    Psychiatric:        Mood and Affect: Mood normal.        Behavior: Behavior normal.    ED Results / Procedures / Treatments   Labs (all labs ordered are listed, but only abnormal results are displayed) Labs Reviewed  CBC WITH DIFFERENTIAL/PLATELET - Abnormal; Notable for the following components:      Result Value    WBC 10.8 (*)    Hemoglobin  12.9 (*)    HCT 38.5 (*)    Neutro Abs 8.0 (*)    Eosinophils Absolute 0.7 (*)    All other components within normal limits  BASIC METABOLIC PANEL - Abnormal; Notable for the following components:   Potassium 5.6 (*)    Glucose, Bld 242 (*)    BUN 26 (*)    Creatinine, Ser 1.46 (*)    GFR, Estimated 58 (*)    All other components within normal limits  BRAIN NATRIURETIC PEPTIDE - Abnormal; Notable for the following components:   B Natriuretic Peptide 182.8 (*)    All other components within normal limits  CBG MONITORING, ED - Abnormal; Notable for the following components:   Glucose-Capillary 157 (*)    All other components within normal limits  RESP PANEL BY RT-PCR (FLU A&B, COVID) [id number]  POTASSIUM    EKG EKG Interpretation  Date/Time:  Thursday April 14 2021 10:11:43 EDT Ventricular Rate:  68 PR Interval:  178 QRS Duration: 86 QT Interval:  408 QTC Calculation: 433 R Axis:   -51 Text Interpretation: Normal sinus rhythm Left anterior fascicular block Abnormal ECG NSR, similar to previous Confirmed by Lavenia Atlas [phone]) on 04/14/2021 1:40:54 PM  Radiology DG Chest 2 View  Result Date: 04/14/2021 CLINICAL DATA:  Shortness of breath. EXAM: CHEST - 2 VIEW COMPARISON:  03/01/2021. FINDINGS: Cardiomegaly with mild pulmonary venous congestion and bilateral interstitial prominence. Findings suggest mild CHF. No prominent pleural effusion. No pneumothorax. No acute bony abnormality. IMPRESSION: Cardiomegaly with mild pulmonary venous congestion and bilateral interstitial prominence suggesting mild CHF. Electronically Signed   By: Marcello Moores  Register   On: 04/14/2021 10:02    Procedures Procedures   Medications Ordered in ED Medications  LORazepam (ATIVAN) injection 0.5 mg (has no administration in time range)  insulin aspart (novoLOG) injection 8 Units (8 Units Subcutaneous Given 04/14/21 1410)    ED Course  I have reviewed the triage vital signs and  the nursing notes.  Pertinent labs & imaging results that were available during my care of the patient were reviewed by me and considered in my medical decision making (see chart for details).  Clinical Course as of 04/14/21 1536  Thu Apr 14, 2021  1223 Glucose(!): 242 [CA]  1223 Creatinine(!): 1.46 [CA]  1223 BUN(!): 26 [CA]  1224 B Natriuretic Peptide(!): 182.8 [CA]    Clinical Course User Index [CA] Karie Kirks   MDM Rules/Calculators/A&P                         50 year old male presents to the ED due to acute on chronic dysphagia after a CVA in February 2022.  Patient states dysphagia has worsened over the past 2 days associated with "choking spells" while drinking liquids and shortness of breath.  Patient found to be hypoxic at 86% upon EMS arrival during "choking spell" which quickly resolved after 2 L nasal cannula.  Upon arrival, vitals all within normal limits.  Patient nontoxic-appearing.  Physical exam reassuring.  Airway sent.  Lungs clear to auscultation bilaterally.  No stridor or wheeze.  No lower extremity edema.  Chest x-ray and EKG ordered at triage.  Added routine labs and BNP.  Patient has residual right-sided weakness from previous CVA which he notes has not changed from baseline.  Low suspicion for CVA.  CXR personally reviewed which demonstrates: IMPRESSION:  Cardiomegaly with mild pulmonary venous congestion and bilateral  interstitial prominence suggesting mild CHF.  Discussed case with Dr. Rory Percy with neurology who recommends MRI brain to rule out worsening CVA.   CBC significant for mild leukocytosis at 10.8 and anemia with hemoglobin at 12.9.  BMP significant for hyperglycemia 242.  No anion gap.  Doubt DKA.  Hyperkalemia at 5.6.  EKG with peaked t-waves at baseline; however, appears better than previous EKG. Renal function around baseline. Insulin given for hyperkalemia and hyperglycemia. COVID/influenza negative. BNP elevated at 182.   Patient  handed off to [facility], PA-C pending repeat potassium and MRI brain. If MRI brain negative for CVA and potassium has improved patient may be discharged home if able to tolerate po. Will need PCP follow-up for further swallow testing Final Clinical Impression(s) / ED Diagnoses Final diagnoses:  Dysphagia, unspecified type  Hyperkalemia    Rx / DC Orders ED Discharge Orders     None        Suzy Bouchard, PA-C 04/14/21 [place], DO 04/18/21 1900

## 2021-04-14 NOTE — ED Notes (Signed)
Pt updated on plane of care. Receptive to information. Pt c/o being hungry. Educated pt that he didn't pass his swallow screening and we are unable to give anything at this time. Pt ambulated to rest room with a steady gait.

## 2021-04-14 NOTE — ED Provider Notes (Signed)
6:17 PM patient signed out from [place] PA-C at shift change.  Patient here with worsening dysphagia and choking spell.  He reports dysphagia worsened yesterday.  He has a history of CVA and right lower extremity weakness at baseline.  MRI shows 3 new areas of new stroke.  Patient states compliance with Eliquis.  Plan for admission.  Discussed with neurology, Dr. Wilford Corner aware of positive findings.  Neurology to consult.  Will touch base with patient's PCP, internal medicine teaching service, for admission.  BP (!) 192/112 (BP Location: Right Arm)   Pulse 70   Temp 98.7 F (37.1 C) (Oral)   Resp 20   SpO2 99%   7:42 PM Spoke with IMTS. They will see patient.      Renne Crigler, PA-C 04/14/21 1942    Little, Ambrose Finland, MD 04/15/21 [phone]

## 2021-04-14 NOTE — ED Triage Notes (Signed)
Patient BIB [facility] after he was swallowing some lemonade and choked, reports simliar incident yesterday. Patient states he had a stroke in the past and was told his swallowing was intact but has had difficulty swallowing recently. Patient alert, oriented, and in no apparent distress at this time.

## 2021-04-14 NOTE — ED Notes (Signed)
Pt able to hold secretions.

## 2021-04-14 NOTE — ED Notes (Signed)
Patient transported to MRI 

## 2021-04-14 NOTE — ED Notes (Signed)
Pt has returned from MRI. 

## 2021-04-14 NOTE — Consult Note (Signed)
Neurology Consultation Reason for Consult: Dysphagia, multifocal strokes Requesting Physician: Lavenia Atlas  CC: Worsening trouble swallowing  History is obtained from: Patient and chart review   HPI: [name]. is a 50 y.o. male with a past medical history significant for atrial fibrillation (prescribed Eliquis), obstructive sleep apnea  (planned CPAP/BiPAP titration due to difficulty tolerating CPAP), diabetes, hypertension, obesity (BMI 46.5), history of polysubstance use including alcohol and cocaine, multiple prior strokes (residual symptoms of right-sided weakness, dysarthria, gait impairment with imbalance, cognitive impairment and possible dysphagia), narcolepsy with cataplexy.  He follows with Dr. Leonie Man and nurse practitioner Darden Dates, last seen for follow-up on 12/14/2020.  At this time his examination was notable for mild to moderate dysarthria with mild baseline stuttering, with worsening of his dysarthria with prolonged conversation or with attempts to speak too quickly.  Orientation was intact to place and time with recent memory impaired but remote memory intact cranial nerves were notable for right lower facial weakness and motor exam was notable for mild weakness of right grip strength with decreased right hand dexterity.  Gait was notable for shortened stride length and step height with mild unsteadiness and imbalance.  Reflexes were 1+ and symmetric with downgoing toes  Regarding his functional baseline, he ambulates with a walker for longer distances  Regarding prior strokes 06/01/2020 left caudate head infarct in the setting of atrial fibrillation with Xarelto noncompliance 11/19/2020 infarcts within the left pons, left temporal lobe and left lateral splenium of the corpus callosum, felt to be compliant with Xarelto at the time and therefore switched to Eliquis due to concerns for Xarelto failure  Regarding his modifiable stroke risk factors on 11/19/2020 his A1c was  noted to have improved from 9.9 previously to 7.7, and due to LDL above goal at 101 atorvastatin was increased from 40 mg daily to 80 mg daily.  He was noted to still be smoking approximately 1 pack a week with a goal of eventual complete cessation.  Secondary prevention strategies include aspirin 81 mg daily as well as apixaban and atorvastatin  He reports that after his 05/2020 stroke he learned to set an alarm to take his meds and has not missed any doses of anticoagulation. Reports sister fills his pill boxes and gave me permission to discuss with her. Sister additionally reports she thinks he's depressed and therefore increased smoking to 1 ppd, though she has no concerns for any other substance use. However, she is certain he does not miss any of his medication doses; pills are always appropriately gone when she goes to fill his pillboxes.  LKW: At least 2 days prior to presentation tPA given?: No, due to out of the window (similar dysphagia the day prior to presentation) Premorbid modified rankin scale: 2-3      2 - Slight disability. Able to look after own affairs without assistance, but unable to carry out all previous activities.     3 - Moderate disability. Requires some help, but able to walk unassisted.  ROS: All other review of systems was negative except as noted in the HPI.  Past Medical History:  Diagnosis Date   Acute pancreatitis 01/22/2019   Diabetes mellitus    GERD (gastroesophageal reflux disease)    History of alcohol abuse    History of cocaine use    History of echocardiogram    a. Echo 4/14: Moderate LVH, vigorous LVEF, EF 65-70%, normal wall motion, grade 2 diastolic dysfunction, mildly dilated aortic root and ascending aorta, ascending aorta  40 mm, aortic root 38 mm, mild LAE   Hx of cardiovascular stress test    a. GXT 5/14: No ischemic changes  //  b. ETT-Myoview 3/16:  Low risk, no ischemia, EF 58%   Hyperlipidemia    Hypertension    Morbid obesity ([place])     Paroxysmal atrial fibrillation ([place])    Occurring in 2008, with several recurrence since then (including in the setting of + cocaine on UDS).   Sleep apnea    uses CPAP   SVT (supraventricular tachycardia) ([place])    mid to long RP SVT 09/2019   Past Surgical History:  Procedure Laterality Date   APPENDECTOMY     ATRIAL FIBRILLATION ABLATION N/A 11/27/2019   Procedure: ATRIAL FIBRILLATION ABLATION;  Surgeon: Thompson Grayer, MD;  Location: [facility];  Service: Cardiovascular;  Laterality: N/A;   ROTATOR CUFF REPAIR     SVT ABLATION N/A 11/27/2019   Procedure: SVT ABLATION;  Surgeon: Thompson Grayer, MD;  Location: [facility];  Service: Cardiovascular;  Laterality: N/A;   TONSILLECTOMY     Current Outpatient Medications  Medication Instructions   Accu-Chek Softclix Lancets lancets TEST UP TO 4 TIMES A DAY   amphetamine-dextroamphetamine (ADDERALL XR) 30 MG 24 hr capsule 30 mg, Oral, Daily PRN, 1 daily as needed   atorvastatin (LIPITOR) 80 mg, Oral, Daily   blood glucose meter kit and supplies KIT Dispense based on patient and insurance preference. Use up to four times daily as directed.   Blood Pressure Monitoring (BLOOD PRESSURE MONITOR/L CUFF) MISC 1 Units, Does not apply, Daily   Eliquis 5 mg, Oral, 2 times daily   flecainide (TAMBOCOR) 100 mg, Oral, 2 times daily, Please make yearly appt with Dr. Rayann Heman for June 2022 before anymore refills. Thank you 1st attempt   lactose free nutrition (BOOST) LIQD 237 mLs, Oral, Daily   metFORMIN (GLUCOPHAGE) 1,000 mg, Oral, 2 times daily with meals   metoprolol succinate (TOPROL XL) 100 mg, Oral, Daily, Take with or immediately following a meal.   modafinil (PROVIGIL) 200 mg, Oral, Daily   nicotine (NICODERM CQ - DOSED IN MG/24 HOURS) 21 mg/24hr patch PLACE 1 PATCH (21 MG TOTAL) ONTO THE SKIN DAILY.   Olmesartan-amLODIPine-HCTZ 40-10-25 MG TABS 1 tablet, Oral, Daily   pantoprazole (PROTONIX) 40 mg, Oral, Daily   Trulicity 1.5 mg,  Subcutaneous, Weekly     Family History  Problem Relation Age of Onset   Diabetes Mother    Heart attack Father 15   Stroke Maternal Grandmother    Stroke Paternal Grandmother    Diabetes Paternal Grandfather     Social History:  reports that he has been smoking cigarettes. He has a 2.80 pack-year smoking history. He has quit using smokeless tobacco.  His smokeless tobacco use included snuff. He reports previous alcohol use of about 6.0 standard drinks of alcohol per week. He reports previous drug use. He reports decreasing smoking but family reports he is smoking 1 ppd and has increased recently  Exam: Current vital signs: BP (!) 192/112 (BP Location: Right Arm)   Pulse 70   Temp 98.7 F (37.1 C) (Oral)   Resp 20   SpO2 99%  Vital signs in last 24 hours: Temp:  [98.7 F (37.1 C)] 98.7 F (37.1 C) (06/30 0910) Pulse Rate:  [65-71] 70 (06/30 1757) Resp:  [18-20] 20 (06/30 1757) BP: (105-192)/(68-112) 192/112 (06/30 1757) SpO2:  [92 %-99 %] 99 % (06/30 1757)   Physical Exam  Constitutional:  Appears well-developed and well-nourished.  Psych: Affect flat, mildly irritable at times but appropriate and cooperative.  Eyes: No scleral injection HENT: No oropharyngeal obstruction.  MSK: no joint deformities.  Cardiovascular: Perfusing extremities well Respiratory: Effort normal, non-labored breathing GI: Soft.  No distension. There is no tenderness.  Skin: Warm dry and intact visible skin  Neuro: Mental Status: Patient is awake, alert, oriented to person, place, month, year, and situation. Patient is able to give a clear and coherent history. No signs of aphasia or neglect Cranial Nerves: II: Visual Fields are full. Pupils are equal, round, and reactive to light.   III,IV, VI: EOMI without ptosis or diploplia.  V: Facial sensation is symmetric to temperature VII: Facial movement is symmetric, very subtle delay in activation of the right side but symmetric on activation.   VIII: hearing is intact to voice X: Uvula elevates symmetrically XI: Shoulder shrug is symmetric. XII: tongue is midline without atrophy or fasciculations.  Motor: Tone is normal. Bulk is normal. 5/5 strength was present in all four extremities. Subtle clumsiness of fine motor control of the right hand in this right-handed patient. Mild pronation of bilateral upper extremities without drift Sensory: Sensation is symmetric to light touch and temperature in the arms and legs. Deep Tendon Reflexes: 3+ and symmetric in the brachioradialis and biceps, 3+ right patella, 2+ left patella.  Cerebellar: FNF intact bilaterally, HKS tested sitting on the edge of the bed and mildly limited due to truncal stability  NIHSS total 1 Score breakdown: Dysarthria, mild   I have reviewed labs in epic and the results pertinent to this consultation are: Labs are notable for mild hyperkalemia to 5.6, hyper glycemia to 242, creatinine 1.46 (baseline 1.2-1.5), BUN 26 (baseline 26-29) Leukocytosis to 10.8 with mild normocytic anemia (hemoglobin 12.9, at his baseline), normocytic  Results for [name], [name] (MRN [id number]) as of 04/14/2021 19:02  Ref. Range 05/06/2007 22:14 01/22/2010 00:46 04/11/2010 11:09 01/20/2013 18:03 12/08/2016 08:52 06/02/2020 01:47 11/19/2020 14:39 02/23/2021 11:41  Hemoglobin A1C Latest Ref Range: 4.0 - 5.6 % 10.6... (H) 10.7... (H) 10.3.Marland Kitchen. (H) 10.0 (H) 13.3 (H) 9.9 (H) 7.7 (H) 8.3 (A)    Lab Results  Component Value Date   CHOL 184 11/19/2020   HDL 27 (L) 11/19/2020   LDLCALC 102 (H) 11/19/2020   LDLDIRECT 103.0 12/08/2016   TRIG 274 (H) 11/19/2020   CHOLHDL 6.8 11/19/2020     I have reviewed the images obtained:  MRI brain personally reviewed, agree with radiology that there are multifocal acute infarcts including the left lower pons, right mid and upper pons, posterior left temporal lobe white matter, and anteromedial left frontal white matter, with multiple chronic  infarcts, age advanced chronic microvascular disease   Impression: This is a 50 year old male with stroke risk factors as above presenting with recurrent multifocal strokes despite medication compliance confirmed with family.  He does have multiple uncontrolled modifiable risk factors (worsening A1c on last check, increasing smoking per family, intolerant of his current CPAP), and perhaps these are driving his refractory hypercoagulable state.  However will broaden stroke work-up with ESR (significantly elevated beyond what would be expected for smoking consider malignancy screening with CT chest abdomen pelvis), as well as RPR testing (consider neurosyphilis if positive).  Remainder of plan as below  Recommendations: # Recurrent multifocal strokes c/f central embolic source, known Afib compliant with Eliquis - Stroke labs ESR, RPR, HgbA1c, fasting lipid panel, UA, UDS - CTA  - Frequent neuro checks - Echocardiogram -  ASA 81 mg daily,  - Hold Eliquis overnight, likely resume tomorrow if patient remains clinically stable  - Risk factor modification, smoking cessation, diet, exercise, CPAP, depression treatment - Telemetry monitoring - Blood pressure goal   - Normotension - SLP consult, patient reluctant for possible feeding tube but discussed high risk of aspiration pneumonia if no safe diet - NPO pending SLP eval  - continue home statin, if LDL > 70 still, consider PSK9 inhibitor - low threshold for CXR if respiratory symptoms arise given high risk of aspiration - Stroke team to follow  [place] [phone] Available 7 PM to 7 AM, outside of these hours please call Neurologist on call as listed on Amion.

## 2021-04-14 NOTE — ED Notes (Signed)
Unable to obtain vitals at this time, pt is in MRI. 

## 2021-04-14 NOTE — ED Provider Notes (Signed)
Emergency Medicine Provider Triage Evaluation Note  [name]. , a 50 y.o. male  was evaluated in triage.  Pt complains of dysphagia.  On my examination, patient reports that he was sipping water out of a straw when he aspirated.  He states that afterward he was coughing and choking.  He is feeling improved after he arrived to the ED.  History is obtained by EMS reports that he was placed on 2 L supplemental oxygen because he dipped to 86% during coughing episode.  Patient states that he had felt perfectly fine prior to onset of his symptoms this morning.  No recent extremity swelling or edema.  He is taking his fluid pills and other medications, as directed.  Endorses chronic tobacco use.  Review of Systems  Positive: Aspiration Negative: Fevers, chills, recent illness/infection, peripheral swelling  Physical Exam  BP 105/68 (BP Location: Right Arm)   Pulse 71   Temp 98.7 F (37.1 C) (Oral)   Resp 20   SpO2 92% he needs Gen:   Awake, no distress   Resp:  Normal effort.  Wheezing bilaterally. MSK:   Moves extremities without difficulty  Other:  No significant lower extremity edema.  Medical Decision Making  Medically screening exam initiated at 9:16 AM.  Appropriate orders placed.  [name]. was informed that the remainder of the evaluation will be completed by another provider, this initial triage assessment does not replace that evaluation, and the importance of remaining in the ED until their evaluation is complete.  Will obtain x-ray.  Aspiration.  He says that it happens frequently and he has been told that he simply needs to concentrate on eating/drinking slowly.   Lorelee New, PA-C 04/14/21 [id number]    Linwood Dibbles, MD 04/15/21 [phone]

## 2021-04-14 NOTE — Discharge Instructions (Addendum)

## 2021-04-15 ENCOUNTER — Inpatient Hospital Stay (HOSPITAL_COMMUNITY): Payer: 59

## 2021-04-15 DIAGNOSIS — I639 Cerebral infarction, unspecified: Secondary | ICD-10-CM

## 2021-04-15 DIAGNOSIS — I6389 Other cerebral infarction: Secondary | ICD-10-CM

## 2021-04-15 DIAGNOSIS — I6340 Cerebral infarction due to embolism of unspecified cerebral artery: Principal | ICD-10-CM

## 2021-04-15 DIAGNOSIS — Z8673 Personal history of transient ischemic attack (TIA), and cerebral infarction without residual deficits: Secondary | ICD-10-CM

## 2021-04-15 DIAGNOSIS — I480 Paroxysmal atrial fibrillation: Secondary | ICD-10-CM

## 2021-04-15 DIAGNOSIS — F1410 Cocaine abuse, uncomplicated: Secondary | ICD-10-CM

## 2021-04-15 LAB — LIPID PANEL
Cholesterol: 114 mg/dL (ref 0–200)
HDL: 23 mg/dL — ABNORMAL LOW (ref >40)
LDL Cholesterol: 49 mg/dL (ref 0–99)
Total CHOL/HDL Ratio: 5 RATIO
Triglycerides: 210 mg/dL — ABNORMAL HIGH (ref <150)
VLDL: 42 mg/dL — ABNORMAL HIGH (ref 0–40)

## 2021-04-15 LAB — CBC
HCT: 36.8 % — ABNORMAL LOW (ref 39.0–52.0)
Hemoglobin: 12.3 g/dL — ABNORMAL LOW (ref 13.0–17.0)
MCH: 28.2 pg (ref 26.0–34.0)
MCHC: 33.4 g/dL (ref 30.0–36.0)
MCV: 84.4 fL (ref 80.0–100.0)
Platelets: 265 10*3/uL (ref 150–400)
RBC: 4.36 MIL/uL (ref 4.22–5.81)
RDW: 13.2 % (ref 11.5–15.5)
WBC: 10.1 10*3/uL (ref 4.0–10.5)
nRBC: 0 % (ref 0.0–0.2)

## 2021-04-15 LAB — GLUCOSE, CAPILLARY
Glucose-Capillary: 116 mg/dL — ABNORMAL HIGH (ref 70–99)
Glucose-Capillary: 121 mg/dL — ABNORMAL HIGH (ref 70–99)
Glucose-Capillary: 186 mg/dL — ABNORMAL HIGH (ref 70–99)
Glucose-Capillary: 275 mg/dL — ABNORMAL HIGH (ref 70–99)

## 2021-04-15 LAB — COMPREHENSIVE METABOLIC PANEL
ALT: 33 U/L (ref 0–44)
AST: 22 U/L (ref 15–41)
Albumin: 3.3 g/dL — ABNORMAL LOW (ref 3.5–5.0)
Alkaline Phosphatase: 76 U/L (ref 38–126)
Anion gap: 8 (ref 5–15)
BUN: 23 mg/dL — ABNORMAL HIGH (ref 6–20)
CO2: 25 mmol/L (ref 22–32)
Calcium: 9.2 mg/dL (ref 8.9–10.3)
Chloride: 106 mmol/L (ref 98–111)
Creatinine, Ser: 1.24 mg/dL (ref 0.61–1.24)
GFR, Estimated: >60 mL/min (ref >60)
Glucose, Bld: 107 mg/dL — ABNORMAL HIGH (ref 70–99)
Potassium: 4 mmol/L (ref 3.5–5.1)
Sodium: 139 mmol/L (ref 135–145)
Total Bilirubin: 0.9 mg/dL (ref 0.3–1.2)
Total Protein: 7 g/dL (ref 6.5–8.1)

## 2021-04-15 LAB — ECHOCARDIOGRAM COMPLETE
Area-P 1/2: 2.05 cm2
Height: 69 in
S' Lateral: 3.5 cm
Weight: 5019.43 oz

## 2021-04-15 LAB — HIV ANTIBODY (ROUTINE TESTING W REFLEX): HIV Screen 4th Generation wRfx: NONREACTIVE

## 2021-04-15 LAB — RPR: RPR Ser Ql: NONREACTIVE

## 2021-04-15 LAB — PROTIME-INR
INR: 1.2 (ref 0.8–1.2)
Prothrombin Time: 15.4 seconds — ABNORMAL HIGH (ref 11.4–15.2)

## 2021-04-15 LAB — SEDIMENTATION RATE: Sed Rate: 28 mm/hr — ABNORMAL HIGH (ref 0–16)

## 2021-04-15 MED ORDER — AMPHETAMINE-DEXTROAMPHET ER 5 MG PO CP24: 30.0000 mg | ORAL_CAPSULE | Freq: Every day | ORAL | Status: DC | PRN

## 2021-04-15 MED ORDER — APIXABAN 5 MG PO TABS
5.0000 mg | ORAL_TABLET | Freq: Two times a day (BID) | ORAL | Status: DC
  Administered 2021-04-15 – 2021-04-16 (×2): 5 mg via ORAL
  Filled 2021-04-15 (×3): qty 1

## 2021-04-15 MED ORDER — ATORVASTATIN CALCIUM 80 MG PO TABS
80.0000 mg | ORAL_TABLET | Freq: Every day | ORAL | Status: DC
  Administered 2021-04-15 – 2021-04-16 (×2): 80 mg via ORAL
  Filled 2021-04-15 (×2): qty 1

## 2021-04-15 MED ORDER — ASPIRIN EC 81 MG PO TBEC
81.0000 mg | DELAYED_RELEASE_TABLET | Freq: Every day | ORAL | Status: DC
  Administered 2021-04-15 – 2021-04-16 (×2): 81 mg via ORAL
  Filled 2021-04-15 (×2): qty 1

## 2021-04-15 MED ORDER — MODAFINIL 100 MG PO TABS: 200.0000 mg | ORAL_TABLET | Freq: Every day | ORAL | Status: DC

## 2021-04-15 MED ORDER — FLECAINIDE ACETATE 100 MG PO TABS
100.0000 mg | ORAL_TABLET | Freq: Two times a day (BID) | ORAL | Status: DC
  Administered 2021-04-15 – 2021-04-16 (×2): 100 mg via ORAL
  Filled 2021-04-15 (×4): qty 1

## 2021-04-15 MED ORDER — PANTOPRAZOLE SODIUM 40 MG PO TBEC
40.0000 mg | DELAYED_RELEASE_TABLET | Freq: Every day | ORAL | Status: DC
  Administered 2021-04-15 – 2021-04-16 (×2): 40 mg via ORAL
  Filled 2021-04-15: qty 1

## 2021-04-15 NOTE — TOC Initial Note (Signed)
Transition of Care (TOC) - Initial/Assessment Note    Patient Details  Name: [name]. MRN: [id number] Date of Birth: [birth year]  Transition of Care [facility]) CM/SW Contact:    Kermit Balo, RN Phone Number: 04/15/2021, 11:21 AM  Clinical Narrative:                 Patient is from home with his sister. She works during the day but is home in the evenings and night.  Recommendations for outpatient PT. Pt stats he has been to [facility] in the past and would like to attend there again. Orders in Epic. CM will attempt to obtain him an appt before d/c and place information on the AVS. Pt denies issues with home medications or transportation. TOC following.  Expected Discharge Plan: OP Rehab Barriers to Discharge: Continued Medical Work up   Patient Goals and CMS Choice     Choice offered to / list presented to : Patient  Expected Discharge Plan and Services Expected Discharge Plan: OP Rehab   Discharge Planning Services: CM Consult   Living arrangements for the past 2 months: Single Family Home                                      Prior Living Arrangements/Services Living arrangements for the past 2 months: Single Family Home Lives with:: Siblings Patient language and need for interpreter reviewed:: Yes Do you feel safe going back to the place where you live?: Yes        Care giver support system in place?: No (comment) Current home services: DME (CPAP/ rollator/) Criminal Activity/Legal Involvement Pertinent to Current Situation/Hospitalization: No - Comment as needed  Activities of Daily Living      Permission Sought/Granted                  Emotional Assessment Appearance:: Appears stated age Attitude/Demeanor/Rapport: Engaged Affect (typically observed): Accepting Orientation: : Oriented to Self, Oriented to Place, Oriented to  Time, Oriented to Situation Alcohol / Substance Use: Illicit Drugs Psych Involvement: No  (comment)  Admission diagnosis:  Hyperkalemia [E87.5] Hyperglycemia [R73.9] CVA (cerebral vascular accident) (HCC) [I63.9] Acute CVA (cerebrovascular accident) (HCC) [I63.9] Dysphagia, unspecified type [R13.10] Patient Active Problem List   Diagnosis Date Noted   CVA (cerebral vascular accident) (HCC) 04/14/2021   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) 03/15/2021   Hypertensive retinopathy of both eyes, grade 2 03/15/2021   Healthcare maintenance 02/23/2021   Dizziness 12/30/2020   Cerebral thrombosis with cerebral infarction 11/20/2020   Right sided weakness 11/19/2020   Hyperkalemia 11/19/2020   AKI (acute kidney injury) (HCC)    Dysarthria    Amnesia 09/08/2020   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   Acute cerebrovascular accident (CVA) (HCC) 06/01/2020   Paronychia of right thumb 05/10/2020   Mediastinal adenopathy 01/06/2020   Secondary hypercoagulable state (HCC) 10/22/2019   Morbid obesity due to excess calories (HCC) 12/08/2016   Paroxysmal atrial fibrillation (HCC) 01/23/2013   Diabetes mellitus (HCC) 01/23/2013   Hypertension 01/23/2013   Tobacco user 01/23/2013   Chronic diastolic heart failure (HCC) 01/21/2013   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   Shoulder pain, right 02/15/2011   Seasonal and perennial allergic rhinitis 02/13/2008   Dyslipidemia 02/12/2008   Cocaine abuse (HCC) 02/12/2008   Obstructive sleep apnea 02/12/2008   PCP:  Evlyn Kanner, MD Pharmacy:   [facility] [id number] -  [address], [name] [address] Phone: [phone] Fax: [phone]     Social Determinants of Health (SDOH) Interventions    Readmission Risk Interventions No flowsheet data found.

## 2021-04-15 NOTE — Evaluation (Signed)
Physical Therapy Evaluation Patient Details Name: [name]. MRN: [id number] DOB: July 30, 1971 Today's Date: 04/15/2021   History of Present Illness  Pt is a 50 year old male admitted 6/30 after presenting to the ED with c/o worsening dysphagia. MRI revealed acute infarcts in the pons, posterior left temporal lobe white  matter, and anteromedial left frontal white matter; as well as remote bilateral frontoparietal white matter infarcts and remote  left pontine infarct. PMH consists of A. fib on Eliquis, OSA on CPAP, HTN, DM type 2, polysubstance use disorder, narcolepsy, and h/o CVA.   Clinical Impression  Pt admitted with above diagnosis. PTA pt lived at home with his sister, independent with mobility. Sister assists with ADLs as needed. On eval, pt demonstrates mod I bed mobility and transfers. Supervision provided for ambulation 200' with RW and min guard assist ambulation an additional 150' without AD. No LOB noted. Pt reports R knee tends to buckle with extended distances but no buckling noted this session.  Pt currently with functional limitations due to the deficits listed below (see PT Problem List). Pt will benefit from skilled PT to increase their independence and safety with mobility to allow discharge home.        Follow Up Recommendations Outpatient PT (neuro OPPT)    Equipment Recommendations  None recommended by PT    Recommendations for Other Services       Precautions / Restrictions Precautions Precautions: Fall      Mobility  Bed Mobility Overal bed mobility: Modified Independent                  Transfers Overall transfer level: Modified independent Equipment used: Ambulation equipment used                Ambulation/Gait Ambulation/Gait assistance: Min Emergency planning/management officer (Feet): 350 Feet Assistive device: None;Rolling walker (2 wheeled) Gait Pattern/deviations: Step-through pattern;Decreased stride length;Wide base of  support Gait velocity: WFL Gait velocity interpretation: >2.62 ft/sec, indicative of community ambulatory General Gait Details: ambulated with and without RW. Supervision with RW and min guard assist without AD. Mildly unsteady but no LOB noted. Pt reports his R knee tends to buckle when he walks extended distances.  Stairs            Wheelchair Mobility    Modified Rankin (Stroke Patients Only) Modified Rankin (Stroke Patients Only) Pre-Morbid Rankin Score: Moderate disability Modified Rankin: Moderately severe disability     Balance Overall balance assessment: Needs assistance Sitting-balance support: No upper extremity supported;Feet supported Sitting balance-Leahy Scale: Good     Standing balance support: No upper extremity supported;During functional activity Standing balance-Leahy Scale: Fair Standing balance comment: amb without AD, cannot accept challenges                             Pertinent Vitals/Pain Pain Assessment: No/denies pain    Home Living Family/patient expects to be discharged to:: Private residence Living Arrangements: Other relatives (sister) Available Help at Discharge: Family;Available PRN/intermittently Type of Home: House Home Access: Level entry     Home Layout: One level Home Equipment: Walker - 2 wheels      Prior Function Level of Independence: Needs assistance   Gait / Transfers Assistance Needed: Pt reports he has a RW but does not use it. Independent household ambulator. Uses shopping cart when ambulating in store.  ADL's / Homemaking Assistance Needed: sister assists with bathing, dressing. Sister does all cooking and cleaning  Comments: Pt does not drive.     Hand Dominance   Dominant Hand: Right    Extremity/Trunk Assessment   Upper Extremity Assessment Upper Extremity Assessment: Defer to OT evaluation    Lower Extremity Assessment Lower Extremity Assessment: RLE deficits/detail RLE Deficits /  Details: strength intact and symmetrical. Pt with c/o numbness plantar surface of R foot.    Cervical / Trunk Assessment Cervical / Trunk Assessment: Normal  Communication   Communication: No difficulties (mildly slurred speech)  Cognition Arousal/Alertness: Awake/alert Behavior During Therapy: WFL for tasks assessed/performed Overall Cognitive Status: Within Functional Limits for tasks assessed                                        General Comments General comments (skin integrity, edema, etc.): VSS on RA    Exercises     Assessment/Plan    PT Assessment Patient needs continued PT services  PT Problem List Decreased mobility;Decreased activity tolerance;Decreased balance       PT Treatment Interventions Therapeutic activities;Gait training;Therapeutic exercise;Patient/family education;Balance training;Functional mobility training    PT Goals (Current goals can be found in the Care Plan section)  Acute Rehab PT Goals Patient Stated Goal: to eat PT Goal Formulation: With patient Time For Goal Achievement: 04/29/21 Potential to Achieve Goals: Good    Frequency Min 4X/week   Barriers to discharge        Co-evaluation               AM-PAC PT "6 Clicks" Mobility  Outcome Measure Help needed turning from your back to your side while in a flat bed without using bedrails?: None Help needed moving from lying on your back to sitting on the side of a flat bed without using bedrails?: None Help needed moving to and from a bed to a chair (including a wheelchair)?: A Little Help needed standing up from a chair using your arms (e.g., wheelchair or bedside chair)?: A Little Help needed to walk in hospital room?: A Little Help needed climbing 3-5 steps with a railing? : A Little 6 Click Score: 20    End of Session Equipment Utilized During Treatment: Gait belt Activity Tolerance: Patient tolerated treatment well Patient left: in bed;with call bell/phone  within reach Nurse Communication: Mobility status PT Visit Diagnosis: Unsteadiness on feet (R26.81)    Time: 4098-1191 PT Time Calculation (min) (ACUTE ONLY): 13 min   Charges:   PT Evaluation $PT Eval Moderate Complexity: 1 Mod          Aida Raider, PT  Office # [phone] Pager [phone]   Ilda Foil 04/15/2021, 8:38 AM

## 2021-04-15 NOTE — Evaluation (Signed)
Occupational Therapy Evaluation Patient Details Name: [name]. MRN: [id number] DOB: [birth year] Today's Date: 04/15/2021    History of Present Illness Pt is a 50 year old male admitted 6/30 after presenting to the ED with c/o worsening dysphagia. MRI revealed acute infarcts in the pons, posterior left temporal lobe white  matter, and anteromedial left frontal white matter; as well as remote bilateral frontoparietal white matter infarcts and remote  left pontine infarct. PMH consists of A. fib on Eliquis, OSA on CPAP, HTN, DM type 2, polysubstance use disorder, narcolepsy, and h/o CVA.   Clinical Impression   PTA, pt lived with sister who assisted with IADLs, and provided supervision during shower transfers. Currently, pt perfroming LB ADLs and functional mobility with min guard A. Pt performing 3 step path finding task with one indirect verbal cue for last step of path finding task. Pt presenting with decreased strength, RUE coordination, cognition, and activity tolerance. Recommend discharge home with no follow up OT and will continue to follow acutely to optimize independence in ADLs.     Follow Up Recommendations  No OT follow up    Equipment Recommendations  None recommended by OT    Recommendations for Other Services       Precautions / Restrictions Precautions Precautions: Fall Restrictions Weight Bearing Restrictions: No      Mobility Bed Mobility Overal bed mobility: Needs Assistance Bed Mobility: Supine to Sit     Supine to sit: Supervision     General bed mobility comments: Pt moving quickly to EOB. Supervision for safety.    Transfers Overall transfer level: Needs assistance Equipment used: None Transfers: Sit to/from Stand Sit to Stand: Min guard         General transfer comment: Min guard for safety    Balance Overall balance assessment: Needs assistance Sitting-balance support: No upper extremity supported;Feet supported Sitting  balance-Leahy Scale: Good Sitting balance - Comments: Pt sitting EOB performing finger to nose test reaching outside of BOS.   Standing balance support: No upper extremity supported;During functional activity Standing balance-Leahy Scale: Good Standing balance comment: Functional mobility in hallway. Performing tub/shower transfer, supporting self on wall with BUE                           ADL either performed or assessed with clinical judgement   ADL Overall ADL's : Needs assistance/impaired Eating/Feeding: NPO   Grooming: Min guard;Standing   Upper Body Bathing: Set up;Supervision/ safety;Sitting   Lower Body Bathing: Sitting/lateral leans;Min guard   Upper Body Dressing : Supervision/safety;Sitting   Lower Body Dressing: Min guard;Sit to/from stand   Toilet Transfer: Min guard;Ambulation;Regular Social worker and Hygiene: Min guard;Sit to/from stand   Tub/ Shower Transfer: Min guard;Ambulation Tub/Shower Transfer Details (indicate cue type and reason): Pt performing tub/shower transfer with min guard for safety. Reporting sister usually watches to make sure he gets in. Supporting self on wall with BUE Functional mobility during ADLs: Min guard General ADL Comments: Pt requiring min guard-close supervision for functional mobility due to decreased balance. Pt requiring supervision for UB ADLs and min guard for LB ADLs.     Vision Patient Visual Report: No change from baseline Vision Assessment?: No apparent visual deficits     Perception Perception Perception Tested?: No Comments: no apparent difficulty with perception   Praxis      Pertinent Vitals/Pain Pain Assessment: No/denies pain     Hand Dominance Right   Extremity/Trunk Assessment  Upper Extremity Assessment Upper Extremity Assessment: RUE deficits/detail;Generalized weakness RUE Deficits / Details: decreased strength of shoulder flexion/extension as compared to LUE.  ROM within functional limits. Decreased fine motor coordination with finger opposition RUE.   Lower Extremity Assessment Lower Extremity Assessment: Defer to PT evaluation RLE Deficits / Details: strength intact and symmetrical. Pt with c/o numbness plantar surface of R foot.   Cervical / Trunk Assessment Cervical / Trunk Assessment: Normal   Communication Communication Communication: No difficulties (mildly slurred speech)   Cognition Arousal/Alertness: Awake/alert Behavior During Therapy: WFL for tasks assessed/performed Overall Cognitive Status: No family/caregiver present to determine baseline cognitive functioning Area of Impairment: Memory;Following commands                     Memory: Decreased short-term memory Following Commands: Follows multi-step commands with increased time;Follows one step commands consistently;Follows one step commands with increased time       General Comments: Pt requiring one indirect verbal cue to recall third step of path finding task. Pt moving impulsively to EOB at beginning of session, but overall moving more safely throughout remainder of session. Perseverating on wanting to eat and believes staff did not want him to eat after choking on water yesterday. Stating "you can go beat her up for me can't you" despite redirection to staff acting in pt best interest and demonstrating poor awareness of safety and safety prioritization.   General Comments  VSS.    Exercises     Shoulder Instructions      Home Living Family/patient expects to be discharged to:: Private residence Living Arrangements: Other relatives (sister) Available Help at Discharge: Family;Available PRN/intermittently Type of Home: House Home Access: Level entry     Home Layout: One level     Bathroom Shower/Tub: Chief Strategy Officer: Standard     Home Equipment: Environmental consultant - 2 wheels          Prior Functioning/Environment Level of Independence:  Needs assistance  Gait / Transfers Assistance Needed: Pt reports he has a RW but does not use it. Independent household ambulator. Uses shopping cart when ambulating in store. ADL's / Homemaking Assistance Needed: sister assists with bathing, dressing. Sister does all cooking and cleaning. Sister sets up pill box   Comments: Pt does not drive.        OT Problem List: Decreased strength;Decreased activity tolerance;Impaired balance (sitting and/or standing);Decreased coordination;Decreased cognition;Decreased safety awareness      OT Treatment/Interventions: Self-care/ADL training;Therapeutic exercise;Therapeutic activities;Cognitive remediation/compensation;Patient/family education    OT Goals(Current goals can be found in the care plan section) Acute Rehab OT Goals Patient Stated Goal: to eat OT Goal Formulation: With patient Time For Goal Achievement: 04/29/21 Potential to Achieve Goals: Good  OT Frequency: Min 2X/week   Barriers to D/C:            Co-evaluation              AM-PAC OT "6 Clicks" Daily Activity     Outcome Measure Help from another person eating meals?: A Little Help from another person taking care of personal grooming?: A Little Help from another person toileting, which includes using toliet, bedpan, or urinal?: A Little Help from another person bathing (including washing, rinsing, drying)?: A Little Help from another person to put on and taking off regular upper body clothing?: A Little Help from another person to put on and taking off regular lower body clothing?: A Little 6 Click Score: 18   End of  Session Equipment Utilized During Treatment: Gait belt Nurse Communication: Mobility status  Activity Tolerance: Patient tolerated treatment well Patient left: in chair;with call bell/phone within reach;with chair alarm set  OT Visit Diagnosis: Unsteadiness on feet (R26.81);Muscle weakness (generalized) (M62.81);Other symptoms and signs involving  cognitive function                Time: 4193-7902 OT Time Calculation (min): 19 min Charges:  OT General Charges $OT Visit: 1 Visit OT Evaluation $OT Eval Moderate Complexity: 1 [address], OTDS   Ladene Artist 04/15/2021, 10:24 AM

## 2021-04-15 NOTE — Evaluation (Signed)
Clinical/Bedside Swallow Evaluation Patient Details  Name: [name]. MRN: [id number] Date of Birth: 1971-01-08  Today's Date: 04/15/2021 Time: SLP Start Time (ACUTE ONLY): 1020 SLP Stop Time (ACUTE ONLY): 1038 SLP Time Calculation (min) (ACUTE ONLY): 18 min  Past Medical History:  Past Medical History:  Diagnosis Date   Acute pancreatitis 01/22/2019   Diabetes mellitus    GERD (gastroesophageal reflux disease)    History of alcohol abuse    History of cocaine use    History of echocardiogram    a. Echo 4/14: Moderate LVH, vigorous LVEF, EF 65-70%, normal wall motion, grade 2 diastolic dysfunction, mildly dilated aortic root and ascending aorta, ascending aorta 40 mm, aortic root 38 mm, mild LAE   Hx of cardiovascular stress test    a. GXT 5/14: No ischemic changes  //  b. ETT-Myoview 3/16:  Low risk, no ischemia, EF 58%   Hyperlipidemia    Hypertension    Morbid obesity (HCC)    Paroxysmal atrial fibrillation (HCC)    Occurring in 2008, with several recurrence since then (including in the setting of + cocaine on UDS).   Sleep apnea    uses CPAP   SVT (supraventricular tachycardia) (HCC)    mid to long RP SVT 09/2019   Past Surgical History:  Past Surgical History:  Procedure Laterality Date   APPENDECTOMY     ATRIAL FIBRILLATION ABLATION N/A 11/27/2019   Procedure: ATRIAL FIBRILLATION ABLATION;  Surgeon: [name], MD;  Location: [facility];  Service: Cardiovascular;  Laterality: N/A;   ROTATOR CUFF REPAIR     SVT ABLATION N/A 11/27/2019   Procedure: SVT ABLATION;  Surgeon: [name], MD;  Location: [facility];  Service: Cardiovascular;  Laterality: N/A;   TONSILLECTOMY     HPI:  Pt is a 50 year old male who presented to the ED for dysphagia. Per H&P,  pt choked while drinking orange juice and subsequently noticed worsening dysphagia with food taking "a long time to clear up." and difficulty breathing. MRI brain 6/30: Acute infarcts in the  pons, posterior left temporal lobe white matter, and anteromedial left frontal white matter. Slight associated edema without mass effect. Pt failed the [facility] due to coughing.PMH: A. fib on Eliquis, OSA on CPAP, hypertension, type 2 diabetes, polysubstance use disorder, narcolepsy, past CVA, outpatient MBS 12/29/19 due to intermittent coughing with thin liquids was WNL.   Assessment / Plan / Recommendation Clinical Impression  Pt was seen for bedside swallow evaluation and he reported intermittent coughing with thin liquids, but stated that he does not have any difficulty with solids. Pt reported that for the past two days he has demonstrated significant coughing with thin liquids at ~0700 and globus sensation (pt identified pharynx) once with boiled eggs at ~0900 on 7/1. Pt stated that these symptoms resolved with time and he was able to consume all subsequent meals during both days without difficulty. Oral mechanism exam was [facility] and he presented with adequate, natural dentition. He tolerated all solids and liquids without signs or symptoms of oropharyngeal dysphagia. A regular texture diet with thin liquids is recommended at this time and, considering pt's reports of recent difficulty, SLP will follow briefly to ensure diet tolerance. SLP Visit Diagnosis: Dysphagia, unspecified (R13.10)    Aspiration Risk  Mild aspiration risk    Diet Recommendation Regular;Thin liquid   Liquid Administration via: Straw;Cup Medication Administration: Whole meds with liquid Supervision: Patient able to self feed Compensations: Slow rate;Small sips/bites Postural Changes: Seated  upright at 90 degrees    Other  Recommendations Oral Care Recommendations: Oral care BID   Follow up Recommendations  (TBD)      Frequency and Duration min 1 x/week  1 week       Prognosis Prognosis for Safe Diet Advancement: Good      Swallow Study   General Date of Onset: 04/14/21 HPI: Pt is a 50 year old male who presented  to the ED for dysphagia. Per H&P,  pt choked while drinking orange juice and subsequently noticed worsening dysphagia with food taking "a long time to clear up." and difficulty breathing. MRI brain 6/30: Acute infarcts in the pons, posterior left temporal lobe white matter, and anteromedial left frontal white matter. Slight associated edema without mass effect. Pt failed the [facility] due to coughing.PMH: A. fib on Eliquis, OSA on CPAP, hypertension, type 2 diabetes, polysubstance use disorder, narcolepsy, past CVA, outpatient MBS 12/29/19 due to intermittent coughing with thin liquids was WNL. Type of Study: Bedside Swallow Evaluation Previous Swallow Assessment: none Diet Prior to this Study: NPO Temperature Spikes Noted: No Respiratory Status: Room air History of Recent Intubation: No Behavior/Cognition: Alert;Pleasant mood;Cooperative Oral Cavity Assessment: Within Functional Limits Oral Care Completed by SLP: No Oral Cavity - Dentition: Adequate natural dentition Vision: Functional for self-feeding Self-Feeding Abilities: Able to feed self Patient Positioning: Upright in chair;Postural control adequate for testing Baseline Vocal Quality: Normal Volitional Cough: Strong Volitional Swallow: Able to elicit    Oral/Motor/Sensory Function Overall Oral Motor/Sensory Function: Within functional limits   Ice Chips Ice chips: Within functional limits Presentation: Spoon   Thin Liquid Thin Liquid: Within functional limits Presentation: Straw    Nectar Thick Nectar Thick Liquid: Not tested   Honey Thick Honey Thick Liquid: Not tested   Puree Puree: Within functional limits Presentation: Spoon   Solid     Solid: Within functional limits Presentation: Self Fed     [name] I. Vear Clock, MS, CCC-SLP Acute Rehabilitation Services Office number [phone] Pager [phone]  Scheryl Marten 04/15/2021,10:42 AM

## 2021-04-15 NOTE — TOC CAGE-AID Note (Signed)
Transition of Care [facility]) - CAGE-AID Screening   Patient Details  Name: [name]. MRN: [id number] Date of Birth: [birth year]  [facility]) CM/SW Contact:    Kermit Balo, RN Phone Number: 04/15/2021, 11:18 AM   Clinical Narrative: Patient admits to cocaine use. He was offered resources for inpatient/ outpatient drug counseling. Pt refused resources.   CAGE-AID Screening:    Have You Ever Felt You Ought to Cut Down on Your Drinking or Drug Use?: Yes Have People Annoyed You By Critizing Your Drinking Or Drug Use?: No Have You Felt Bad Or Guilty About Your Drinking Or Drug Use?: No Have You Ever Had a Drink or Used Drugs First Thing In The Morning to Steady Your Nerves or to Get Rid of a Hangover?: No CAGE-AID Score: 1  Substance Abuse Education Offered: Yes (pt refused)

## 2021-04-15 NOTE — Progress Notes (Signed)
Echocardiogram complete.  Josephine Rudnick RDCS  

## 2021-04-15 NOTE — Evaluation (Signed)
Speech Language Pathology Evaluation Patient Details Name: [name]. MRN: [id number] DOB: [birth year] Today's Date: 04/15/2021 Time: 3016-0109 SLP Time Calculation (min) (ACUTE ONLY): 16 min  Problem List:  Patient Active Problem List   Diagnosis Date Noted   CVA (cerebral vascular accident) (HCC) 04/14/2021   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) 03/15/2021   Hypertensive retinopathy of both eyes, grade 2 03/15/2021   Healthcare maintenance 02/23/2021   Dizziness 12/30/2020   Cerebral thrombosis with cerebral infarction 11/20/2020   Right sided weakness 11/19/2020   Hyperkalemia 11/19/2020   AKI (acute kidney injury) (HCC)    Dysarthria    Amnesia 09/08/2020   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   Acute cerebrovascular accident (CVA) (HCC) 06/01/2020   Paronychia of right thumb 05/10/2020   Mediastinal adenopathy 01/06/2020   Secondary hypercoagulable state (HCC) 10/22/2019   Morbid obesity due to excess calories (HCC) 12/08/2016   Paroxysmal atrial fibrillation (HCC) 01/23/2013   Diabetes mellitus (HCC) 01/23/2013   Hypertension 01/23/2013   Tobacco user 01/23/2013   Chronic diastolic heart failure (HCC) 01/21/2013   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 Echo 4/14: Moderate LVH, vigorous LVEF, EF 65-70%, normal wall motion, grade 2 diastolic dysfunction, mildly dilated aortic root and ascending aorta, ascending aorta 40 mm, aortic root 38 mm, mild LAE   Hx of cardiovascular stress test     a. GXT 5/14: No ischemic changes  //  b. ETT-Myoview 3/16:  Low risk, no ischemia, EF 58%   Hyperlipidemia    Hypertension    Morbid obesity (HCC)    Paroxysmal atrial fibrillation (HCC)    Occurring in 2008, with several recurrence since then (including in the setting of + cocaine on UDS).   Sleep apnea    uses CPAP   SVT (supraventricular tachycardia) (HCC)    mid to long RP SVT 09/2019   Past Surgical History:  Past Surgical History:  Procedure Laterality Date   APPENDECTOMY     ATRIAL FIBRILLATION ABLATION N/A 11/27/2019   Procedure: ATRIAL FIBRILLATION ABLATION;  Surgeon: [name], MD;  Location: [facility];  Service: Cardiovascular;  Laterality: N/A;   ROTATOR CUFF REPAIR     SVT ABLATION N/A 11/27/2019   Procedure: SVT ABLATION;  Surgeon: [name], MD;  Location: [facility];  Service: Cardiovascular;  Laterality: N/A;   TONSILLECTOMY     HPI:  Pt is a 50 year old male who presented to the ED for dysphagia. Per H&P,  pt choked while drinking orange juice and subsequently noticed worsening dysphagia with food taking "a long time to clear up." and difficulty breathing. MRI brain 6/30: Acute infarcts in the pons, posterior left temporal lobe white matter, and anteromedial left frontal white matter. Slight associated edema without mass effect. Pt failed the [facility] due to coughing.PMH: A. fib on Eliquis, OSA on CPAP, hypertension, type 2 diabetes, polysubstance use disorder, narcolepsy, past CVA, outpatient MBS 12/29/19 due to intermittent coughing with thin liquids was WNL. SLE 06/02/20: [facility]MS cognitive assessment and  scored a 19/20 and exhibits mild-mod dysarthria. Most difficulty with memory, backward digit span tasks, and clock drawing/accurate time.   Assessment / Plan / Recommendation Clinical Impression  Pt participated in speech/language/cognition evaluation. Pt reported baseline dysarthria and deficits in cognition, but normal language skills. He indicated  that he resides with his sister to assists with medication management. He denied any acute changes in language, speech, or cognition. Pt reported that he uses external memory aids and that his sister assists with medication management. His language skills are currently within normal limits. Pt presented with dysarthria characterized by imprecise articulation, but his speech was >90% intelligible. He exhibited cognitive-linguistic difficulty related to memory, attention, complex problem solving, and executive function. Pt's presentation appears similar to that described in his cognitive-linguistic evaluation in 2021 and during his outpatient SLP treatment sessions in 2022. Further acute skilled SLP services are not clinically indicated at this time for speech/language/cognition since pt appears to be at baseline in these areas. SLP will follow for swallowing. Pt was educated regarding results and recommendations; he verbalized understanding as well as agreement with plan of care.    SLP Assessment  SLP Recommendation/Assessment: Patient does not need any further Speech Lanaguage Pathology Services SLP Visit Diagnosis: Dysarthria and anarthria (R47.1);Cognitive communication deficit (R41.841)    Follow Up Recommendations  None    Frequency and Duration         SLP Evaluation Cognition  Overall Cognitive Status: Impaired/Different from baseline Arousal/Alertness: Awake/alert Orientation Level: Oriented X4 Attention: Focused;Sustained Focused Attention: Appears intact Sustained Attention: Impaired Sustained Attention Impairment: Verbal complex Memory: Impaired Memory Impairment: Retrieval deficit;Decreased recall of new information (Immediate: 5/5; delayed: 0/5; with cues: 4/5) Awareness: Appears intact Problem Solving: Impaired Problem Solving Impairment: Verbal complex       Comprehension  Auditory Comprehension Overall Auditory Comprehension: Appears within functional limits for tasks  assessed Yes/No Questions: Within Functional Limits Basic Biographical Questions:  (5/5) Basic Immediate Environment Questions:  (5/5) Complex Questions:  (Open ended paragraph: 4/4) Commands: Within Functional Limits    Expression Verbal Expression Overall Verbal Expression: Appears within functional limits for tasks assessed Initiation: No impairment Level of Generative/Spontaneous Verbalization: Conversation Repetition: No impairment Naming: No impairment Written Expression Dominant Hand: Right   Oral / Motor  Oral Motor/Sensory Function Overall Oral Motor/Sensory Function: Within functional limits Motor Speech Overall Motor Speech: Impaired at baseline Respiration: Within functional limits Phonation: Normal Resonance: Within functional limits Articulation: Impaired Level of Impairment: Conversation Intelligibility: Intelligibility reduced Word: 75-100% accurate Phrase: 75-100% accurate Sentence: 75-100% accurate Conversation: 75-100% accurate Motor Planning: Witnin functional limits Motor Speech Errors: Aware;Consistent   Dorreen Valiente I. Vear Clock, MS, CCC-SLP Acute Rehabilitation Services Office number [phone] Pager [phone]                    Scheryl Marten 04/15/2021, 11:27 AM

## 2021-04-15 NOTE — Progress Notes (Addendum)
Subjective: Patient seated in chair during encounter. Endorses difficulty swallowing and spitting. Denies new weakness, changes in vision. Denies new change in speech. Stated he last used cocaine one week ago and has not used cocaine since. States this was an isolated incident and that it had been some time since he had used cocaine prior to one week ago. Patient endorses adherence to blood thinner medication.   Objective:  Vital signs in last 24 hours: Vitals:   04/15/21 0122 04/15/21 0319 04/15/21 0717 04/15/21 1123  BP:  (!) 139/95 123/77 136/84  Pulse: 75 77 76 76  Resp: 20 18 17 17   Temp:  98.3 F (36.8 C) 98.2 F (36.8 C) (!) 97.5 F (36.4 C)  TempSrc:  Oral Oral Oral  SpO2: 100% 95% 94% 99%  Weight:      Height:        Intake/Output Summary (Last 24 hours) at 04/15/2021 1306 Last data filed at 04/14/2021 2300 Gross per 24 hour  Intake 0 ml  Output --  Net 0 ml    CBC Latest Ref Rng & Units 04/15/2021 04/14/2021 11/21/2020  WBC 4.0 - 10.5 K/uL 10.1 10.8(H) 8.2  Hemoglobin 13.0 - 17.0 g/dL 12.3(L) 12.9(L) 12.9(L)  Hematocrit 39.0 - 52.0 % 36.8(L) 38.5(L) 37.5(L)  Platelets 150 - 400 K/uL 265 277 273    BMP Latest Ref Rng & Units 04/15/2021 04/14/2021 04/14/2021  Glucose 70 - 99 mg/dL 04/16/2021) - 400(Q)  BUN 6 - 20 mg/dL 676(P) - 95(K)  Creatinine 0.61 - 1.24 mg/dL 93(O - 6.71)  BUN/Creat Ratio 9 - 20 - - -  Sodium 135 - 145 mmol/L 139 - 136  Potassium 3.5 - 5.1 mmol/L 4.0 4.9 5.6(H)  Chloride 98 - 111 mmol/L 106 - 106  CO2 22 - 32 mmol/L 25 - 23  Calcium 8.9 - 10.3 mg/dL 9.2 - 8.9    Hgb 2.45(Y: pending HIV non reactive RPR non reactive LDL 49 HDL 23 (L) Triglycerides 210 (H) UDS positive for cocaine UA notable for 100 protein, Hgb moderate without RBCs  Diagnostic Imaging:   CTA 6/30: IMPRESSION: 1. No emergent large vessel occlusion or high-grade stenosis of the head or neck. 2. Multiple white matter infarcts of mixed ages, including recent ones demonstrated by  MRI.   By: 7/30 M.D  MRI Brain 6/30: IMPRESSION: 1. Acute infarcts in the pons, posterior left temporal lobe white matter, and anteromedial left frontal white matter. Slight associated edema without mass effect. Given involvement of multiple vascular territories, consider a central embolic etiology. 2. Remote bilateral frontoparietal white matter infarcts and remote left pontine infarct. 3. Age advanced chronic microvascular ischemic disease.   By: 7/30 MD  Physical Exam:  General: NAD, well appearing middle aged african american male Eyes: no scleral icterus, conjunctiva clear HENT: atraumatic, moist mucus membranes CV: regular rate and rhythm, no murmurs, no LEE Pulmonary: lungs clear to auscultation bilaterally, no wheezing, rales, rhonchi Abdominal:  Bowel sounds present, non distended, non tender to palpation Skin: warm and dry, no rash or lesions on visible surfaces Neuro:  Mental Status: awake alert and oriented x3, normal fund of knowledge, memory intact Speech: possible dysarthria CNs: 2 PERRLA, visual fields intact 3,4,6 EOM intact all carinal directions 5 facial sensation intact all 3 distributions 7 face appears symmetric 8 hearing intact 9, 10 palate and uvula elevate symmetrically 11 shoulder shrug 5/5 12 tongue midline   Motor: 5/5 BUE and BLE, minimal weakness on RHB Reflexes: 2+  brachial, patellar and achilles reflexes Sensation: intact to light touch all four extremities Coordination: FTN intact   Assessment/Plan: [name] Is a 50 yo male with PMHx of 2 prior CVAs (last one this Feb) with reported residual RHB weakness, atrial fibrillation on Eliquis (previously failed Xeralto), HTN, HLD, T2DM, tobacco use, OSA, and narcolepsy who presented on 6/30 with dysphagia and was found to have new acute L pontine and L temporal lobe infarcts cryptogenic in etiology    Principal Problem:   CVA (cerebral vascular accident)  (HCC) Active Problems:   Dyslipidemia   Chronic diastolic heart failure (HCC)   Paroxysmal atrial fibrillation (HCC)   Diabetes mellitus (HCC)   Hypertension  Acute Multifocal Cryptogenic Stroke  Dysphagia Possible Eliquis failure Hx 2 prior CVAs with reported RHB weakness Hx Xeralto Failure -Neurology following appreciate recs -CTA 6/30 without LVO or high grade stenosis -MRI Brain 6/30 small infarcts left lower pons, right mid and upper pons, posterior left temporal lobe and left frontal lobe -SLP recommends regular diet with thin liquids  -ASA 81 daily -Telemetry - Hgb A1C pending, LDL 49 -TTE 7/1 EF 60-65% with normal LV function and severe LV hypertrophy, no atrial thrombus visualized, technically difficult echo with poor image quality -Neuro recommends TEE, will order OP TEE prior to discharge tomorrow -Continue Eliquis given strokes are small, low risk for hemorrhagic conversion -Continue home Atorvastatin -PT/OT -plan for discharge tomorrow -F/u with stroke clinic and internal medicine clinic after discharge  Atrial Fibrillation s/p ablation (11/2020) CHADSVAS score 4 Possible Eliquis Failure Previously Failed Xeralto -pt takes metoprolol and flecainide for rhythm control -continue home flecainide 100mg  BID -restart tomorrow AM toprol XL 100mg  daily  -Eliqius for anticoagulation  HFpEF Echo in 11/2020 EF 60-65% Weight at baseline, stable respiratory status, does not appear volume overloaded on exam therefore holding off on diuresis    Hypertension -home HTN meds: metoprolol succinate 100 mg and Olmesartan-Amlodipine-HCTZ 40-10-25 QD daily, restart tomorrow morning -Currently blood pressure is trending 120-130 though Bps were elevated night of admission in the 160-190 range.  Hyperlipidemia -LDL (49)  is at goal ( for stroke prevention <70 ) -continue atorvastatin 80mg   T2DM Previous Hgb A1c on 02/23/21 was 8.3.Goal for stroke prevention is <7 Home regimen:  Metformin, Trulicity -Hgb A1c pending -SSI q6hr while inpatient -Monitor CBG  Elevated creatinine, resolved Hyperkalemia, resolved Serum creatinine 1.46 at admission, slightly elevated from baseline of 1.3. Cr back down to 1.24 on 7/1 Initial potassium was 5.6, repeat potassium of 4.9.     Narcolepsy -continue home meds Adderall and modafinil   Substance Use Disorder -UDS positive for cocaine  -patient education on SUD  Prior to Admission Living Arrangement:Home Anticipated Discharge Location: Home Barriers to Discharge: Waiting on diagnostic imaging Dispo: Anticipated discharge tomorrow  04/15/2021, 1:02 PM Pager: [phone] After 5pm on weekdays and 1pm on weekends: On Call pager [phone]

## 2021-04-15 NOTE — Progress Notes (Addendum)
STROKE TEAM PROGRESS NOTE   ATTENDING NOTE: I reviewed above note and agree with the assessment and plan. Pt was seen and examined.   50 year old male with history of A. fib on Eliquis, OSA, diabetes, hypertension, morbid obesity, smoker, cocaine abuse, narcolepsy, strokes admitted for worsening slurred speech and swallowing difficulty.  Patient had left carotid infarct in 05/2020, continued Xarelto at that time.  In 11/2020, patient had embolic strokes on MRI, considered Xarelto failure, put on Eliquis.  Patient compliant with medication.  This admission MRI showed a small right pontine, left frontal white matter, left temporal white matter infarcts.  CTA head and neck unremarkable.  LDL 49, TG 210, EF 60 to 65%, A1c pending but 8.3 in 02/2021, UDS positive for cocaine.  On exam, patient neurologically intact, no focal deficit.  Although it could be synchronized vessel disease given location and uncontrolled risk factors (HTN, cocaine, DM), cardioembolic stroke still should be considered given different vessel territory.  Patient compliant with Eliquis, would add aspirin 81 on top of Eliquis.  Continue Lipitor 80.  However, given recurrent stroke on Eliquis, recommend TEE to rule out large PFO or cardiac tumor.  Patient is agreeble to proceed.  However, due to holiday weekend and the patient neuro stable, will do TEE as outpatient.  We will also do LE venous Doppler to rule out DVT.  Will also check hypercoagulabe labs in am. Educated on cocaine and smoking cessation, patient is willing to quit.   For detailed assessment and plan, please refer to above as I have made changes wherever appropriate.   Neurology will sign off. Please call with questions. Pt will follow up with stroke clinic NP Jessica at [facility] on 04/19/21. Thanks for the consult.   Marvel Plan, MD PhD Stroke Neurology 04/15/2021 7:13 PM    Interval History   No acute events overnight, patient is up in chair at bedside eating lunch.    He was educated about his stroke, stroke risk factors, shown his stroke on MRI.  He was also informed that it would benefit him to stop smoking cigarettes and using cocaine as using these drugs increases his risk of having further strokes.   Talked to him about how his current stroke etiology is cryptogenic and there is a need for further cardiac work up with TEE.    Pertinent Lab Work and Imaging    04/14/21 CT Head WO IV Contrast There are multiple hypodense white matter lesions. The number of lesions has increased since 06/01/2020. No acute hemorrhage. No midline shift or mass effect  04/14/21 CT Angio Head and Neck W WO IV Contrast 1. No emergent large vessel occlusion or high-grade stenosis of the head or neck. 2. Multiple white matter infarcts of mixed ages, including recent ones demonstrated by MRI  04/14/21 MRI Brain WO IV Contrast 1. Acute infarcts in the pons, posterior left temporal lobe white matter, and anteromedial left frontal white matter. Slight associated edema without mass effect. Given involvement of multiple vascular territories, consider a central embolic etiology. 2. Remote bilateral frontoparietal white matter infarcts and remote left pontine infarct. 3. Age advanced chronic microvascular ischemic disease.  04/15/21 Echocardiogram Complete  Pending   Physical Examination   Constitutional: Calm, appropriate for condition  Cardiovascular: Normal RR Respiratory: No increased WOB   Mental status: AAOx4, following commands Speech: Fluent, repetition and naming intact + dysarthria  Cranial nerves: EOMI, VFF, Right Facial Droop, Tongue midline, Shoulder shrug intact  Motor: Normal bulk and tone. No drift.  5/5 strength throughout  Sensory: Intact to light touch throughout  Coordination: Intact FNF  Gait: Deferred   NIHSS 2 for dysarthria and right facial droop    Assessment and Plan   Mr. Montrez Marietta. is a 50 y.o. male w/pmh of atrial  fibrillation, OSA, diabetes, hypertension, obesity, polysubstance use ( alcohol, cocaine), multiple prior strokes w/residual sx (right sided weakness, dysarthria, gait impairment, cognitive impairment, dysphagia) and narcolepsy who presents with worsening trouble swallowing.  #Acute Embolic Stroke  Patient presented with the symptoms described above. At this time, stroke work up is ongoing. MRI Brain showed multifocal acute stroke in the left lower pons, right mid and upper pons, posterior left temporal lobe and left frontal lobe. CTA Head and Neck did not reveal significant stenosis. Echo is pending. Stroke labs w/LDL 49, hemoglobin A1C done 02/23/21 8.3. UDS positive for cocaine. His stroke etiology is cryptogenic. He has been compliant with taking his Eliquis at home. Questionable eliquis failure however will need to rule out LAA thrombus with further cardiac work up.  - Plan for TEE, if primary team plans to discharge him will need to arrange this outpatient with stroke follow up afterwards - Continue eliquis for stroke prevention given strokes are small, low risk of hemorrhagic conversion ( Eliquis ordered)  - Continue Aspirin 81 mg for treatment of small vessel disease  - Continue Atorvastatin 80 mg QD for stroke prevention  - He follows with stroke clinic and will continue to at discharge   #Hypertension He has a history of HTN and takes Metoprolol 100 Q24 hr and Olmesartan-Amlodipine-HCTZ 40-10-25 QD at home. Currently blood pressure is trending 120-130 were elevated overnight in the 160-190 range. Recommend permissive hypertension 48 hours post stroke and from there, gradually reduce the blood pressure, avoiding any acute drops. Long term blood pressure goal is < 140/90.  #Hyperlipidemia From a stroke prevention stand point, the LDL goal is < 70. LDL is 49 which is at goal will continue Atorvastatin 80 mg QD.   #DMII  Hemoglobin A1C from 02/23/21 noted to be 8.3. Goal from a stroke prevention  standpoint is < 7, recommend to manage hyperglycemia with SSI while inpatient.   Hospital day # 1  Stark Jock, NP  [facility] Nurse Practitioner Patient seen and discussed with attending physician Dr. Roda Shutters    To contact Stroke Continuity provider, please refer to [email or web address]. After hours, contact General Neurology

## 2021-04-16 ENCOUNTER — Other Ambulatory Visit: Payer: Self-pay | Admitting: Internal Medicine

## 2021-04-16 ENCOUNTER — Inpatient Hospital Stay (HOSPITAL_COMMUNITY): Payer: 59

## 2021-04-16 DIAGNOSIS — I639 Cerebral infarction, unspecified: Secondary | ICD-10-CM

## 2021-04-16 DIAGNOSIS — E1165 Type 2 diabetes mellitus with hyperglycemia: Secondary | ICD-10-CM

## 2021-04-16 LAB — BASIC METABOLIC PANEL
Anion gap: 11 (ref 5–15)
BUN: 30 mg/dL — ABNORMAL HIGH (ref 6–20)
CO2: 23 mmol/L (ref 22–32)
Calcium: 8.7 mg/dL — ABNORMAL LOW (ref 8.9–10.3)
Chloride: 101 mmol/L (ref 98–111)
Creatinine, Ser: 1.65 mg/dL — ABNORMAL HIGH (ref 0.61–1.24)
GFR, Estimated: 50 mL/min — ABNORMAL LOW (ref >60)
Glucose, Bld: 218 mg/dL — ABNORMAL HIGH (ref 70–99)
Potassium: 3.9 mmol/L (ref 3.5–5.1)
Sodium: 135 mmol/L (ref 135–145)

## 2021-04-16 LAB — HEMOGLOBIN A1C
Hgb A1c MFr Bld: 8.2 % — ABNORMAL HIGH (ref 4.8–5.6)
Mean Plasma Glucose: 189 mg/dL

## 2021-04-16 LAB — GLUCOSE, CAPILLARY
Glucose-Capillary: 194 mg/dL — ABNORMAL HIGH (ref 70–99)
Glucose-Capillary: 212 mg/dL — ABNORMAL HIGH (ref 70–99)
Glucose-Capillary: 295 mg/dL — ABNORMAL HIGH (ref 70–99)

## 2021-04-16 MED ORDER — DAPAGLIFLOZIN PROPANEDIOL 5 MG PO TABS
5.0000 mg | ORAL_TABLET | Freq: Every day | ORAL | Status: DC
  Administered 2021-04-16: 5 mg via ORAL
  Filled 2021-04-16: qty 1

## 2021-04-16 MED ORDER — METOPROLOL SUCCINATE ER 100 MG PO TB24
100.0000 mg | ORAL_TABLET | Freq: Every day | ORAL | Status: DC
  Administered 2021-04-16: 100 mg via ORAL
  Filled 2021-04-16: qty 1

## 2021-04-16 MED ORDER — DAPAGLIFLOZIN PROPANEDIOL 5 MG PO TABS: 5.0000 mg | ORAL_TABLET | Freq: Every day | ORAL | 1 refills | Status: DC

## 2021-04-16 MED ORDER — OLMESARTAN-AMLODIPINE-HCTZ 40-10-25 MG PO TABS: 1.0000 | ORAL_TABLET | Freq: Every day | ORAL | 3 refills | Status: DC

## 2021-04-16 MED ORDER — ASPIRIN 81 MG PO TBEC: 81.0000 mg | DELAYED_RELEASE_TABLET | Freq: Every day | ORAL | 11 refills | Status: DC

## 2021-04-16 NOTE — Progress Notes (Signed)
Physical Therapy Treatment Patient Details Name: [name]. MRN: [id number] DOB: 05-Jul-1971 Today's Date: 04/16/2021    History of Present Illness Pt is a 50 year old male admitted 6/30 after presenting to the ED with c/o worsening dysphagia. MRI revealed acute infarcts in the pons, posterior left temporal lobe white  matter, and anteromedial left frontal white matter; as well as remote bilateral frontoparietal white matter infarcts and remote  left pontine infarct. PMH consists of A. fib on Eliquis, OSA on CPAP, HTN, DM type 2, polysubstance use disorder, narcolepsy, and h/o CVA.    PT Comments    Patient progressing towards physical therapy goals. Patient ambulated 250' with supervision and no AD. Worked on stair training with no rails and min guard. Simulated car transfer based on lifted trunk with running boards. Lengthy discussion on safe negotiation of stairs and truck with ascending using stronger leg and descending with weaker leg, patient verbalized understanding. Continue to recommend OPPT following discharge to address strength and balance deficits.     Follow Up Recommendations  Outpatient PT     Equipment Recommendations  None recommended by PT    Recommendations for Other Services       Precautions / Restrictions Precautions Precautions: Fall Restrictions Weight Bearing Restrictions: No    Mobility  Bed Mobility               General bed mobility comments: standing in room upon arival    Transfers                 General transfer comment: standing in room upon arival  Ambulation/Gait Ambulation/Gait assistance: Supervision Gait Distance (Feet): 250 Feet Assistive device: None Gait Pattern/deviations: Step-through pattern;Decreased stride length;Wide base of support Gait velocity: WFL   General Gait Details: supervision for safety as patient tends to drift L/R and have minor LOBs throughout but self correcting   Stairs Stairs:  Yes Stairs assistance: Min guard Stair Management: No rails;Step to pattern;Forwards Number of Stairs: 2 General stair comments: min guard for safety. Also simulated car transfer with patient based on lifted truck using running board. Instructed on use of strong leg to assist with stepping up into truck and stepping down with weaker leg   Wheelchair Mobility    Modified Rankin (Stroke Patients Only) Modified Rankin (Stroke Patients Only) Pre-Morbid Rankin Score: Moderate disability Modified Rankin: Moderately severe disability     Balance Overall balance assessment: Needs assistance Sitting-balance support: No upper extremity supported;Feet supported Sitting balance-Leahy Scale: Good     Standing balance support: No upper extremity supported;During functional activity Standing balance-Leahy Scale: Fair Standing balance comment: minor LOBs noted throughout ambulation, supervision for safety                            Cognition Arousal/Alertness: Awake/alert Behavior During Therapy: WFL for tasks assessed/performed Overall Cognitive Status: Within Functional Limits for tasks assessed                                        Exercises      General Comments        Pertinent Vitals/Pain Pain Assessment: No/denies pain    Home Living Family/patient expects to be discharged to:: Private residence Living Arrangements: Other relatives                  Prior Function  PT Goals (current goals can now be found in the care plan section) Acute Rehab PT Goals Patient Stated Goal: to go home PT Goal Formulation: With patient Time For Goal Achievement: 04/29/21 Potential to Achieve Goals: Good Progress towards PT goals: Progressing toward goals    Frequency    Min 4X/week      PT Plan Current plan remains appropriate    Co-evaluation              AM-PAC PT "6 Clicks" Mobility   Outcome Measure  Help needed  turning from your back to your side while in a flat bed without using bedrails?: None Help needed moving from lying on your back to sitting on the side of a flat bed without using bedrails?: None Help needed moving to and from a bed to a chair (including a wheelchair)?: None Help needed standing up from a chair using your arms (e.g., wheelchair or bedside chair)?: None Help needed to walk in hospital room?: A Little Help needed climbing 3-5 steps with a railing? : A Little 6 Click Score: 22    End of Session   Activity Tolerance: Patient tolerated treatment well Patient left: in bed;with call bell/phone within reach Nurse Communication: Mobility status PT Visit Diagnosis: Unsteadiness on feet (R26.81)     Time: 0102-7253 PT Time Calculation (min) (ACUTE ONLY): 24 min  Charges:  $Gait Training: 8-22 mins $Therapeutic Activity: 8-22 mins                     Deunta Beneke A. Dan Humphreys PT, DPT Acute Rehabilitation Services Pager [phone] Office [phone]    Viviann Spare 04/16/2021, 4:30 PM

## 2021-04-16 NOTE — Discharge Summary (Addendum)
 Name: Douglas Claude Wolfert Jr. MRN: [id number] DOB: 06/08/1971 50 y.o. PCP: Braswell, Phillip, MD  Date of Admission: 04/14/2021  9:07 AM Date of Discharge: 04/16/21  Attending Physician: Guilloud, Carolyn, MD  Discharge Diagnosis: 1. Acute Multifocal Cryptogenic Stroke 2. Dysphagia  Discharge Medications: Allergies as of 04/16/2021       Reactions   Shrimp [shellfish Allergy] Anaphylaxis   Banana Other (See Comments)   Pt gags   Watermelon Flavor Nausea And Vomiting   Other Itching   Grapes cause itching   Almond Oil Itching   Roof of mouth itches   Peanut-containing Drug Products Itching, Cough   Sulfa Antibiotics Itching        Medication List     STOP taking these medications    ibuprofen 800 MG tablet Commonly known as: ADVIL       TAKE these medications    Accu-Chek Softclix Lancets lancets TEST UP TO 4 TIMES A DAY   amphetamine-dextroamphetamine 30 MG 24 hr capsule Commonly known as: ADDERALL XR Take 1 capsule (30 mg total) by mouth daily as needed (focus). 1 daily as needed   aspirin 81 MG EC tablet Take 1 tablet (81 mg total) by mouth daily. Swallow whole. Start taking on: April 17, 2021   atorvastatin 80 MG tablet Commonly known as: LIPITOR Take 1 tablet (80 mg total) by mouth daily.   blood glucose meter kit and supplies Kit Dispense based on patient and insurance preference. Use up to four times daily as directed.   Blood Pressure Monitor/L Cuff Misc 1 Units by Does not apply route daily.   dapagliflozin propanediol 5 MG Tabs tablet Commonly known as: FARXIGA Take 1 tablet (5 mg total) by mouth daily. Start taking on: April 17, 2021   Eliquis 5 MG Tabs tablet Generic drug: apixaban Take 1 tablet (5 mg total) by mouth 2 (two) times daily.   flecainide 100 MG tablet Commonly known as: TAMBOCOR Take 1 tablet (100 mg total) by mouth 2 (two) times daily. Please make yearly appt with Dr. Allred for June 2022 before anymore refills. Thank  you 1st attempt   metFORMIN 500 MG tablet Commonly known as: GLUCOPHAGE Take 1,000 mg by mouth 2 (two) times daily with a meal.   metoprolol succinate 100 MG 24 hr tablet Commonly known as: Toprol XL Take 1 tablet (100 mg total) by mouth daily. Take with or immediately following a meal.   modafinil 200 MG tablet Commonly known as: PROVIGIL Take 1 tablet (200 mg total) by mouth daily. What changed:  when to take this reasons to take this   nicotine 21 mg/24hr patch Commonly known as: NICODERM CQ - dosed in mg/24 hours PLACE 1 PATCH (21 MG TOTAL) ONTO THE SKIN DAILY.   Olmesartan-amLODIPine-HCTZ 40-10-25 MG Tabs Take 1 tablet by mouth daily. Please restart this medication on 04/18/2021 What changed: additional instructions   pantoprazole 40 MG tablet Commonly known as: PROTONIX Take 1 tablet (40 mg total) by mouth daily.   Trulicity 0.75 MG/0.5ML Sopn Generic drug: Dulaglutide Inject 0.75 mg into the skin every Monday.        Disposition and follow-up:   DouglasDouglas Claude Leise Jr. was discharged from [facility] in Stable condition.  At the hospital follow up visit please address:  1.  Acute Multifocal Cryptogenic Stroke: Needs outpatient TEE. Discharged on Eliquis, ASA 81 mg, and atorvastatin 80 mg. Please follow up hypercoag work up which is pending on discharge.  Uncontrolled Type II   DM: With Hgb A1c of 8.2. Metformin and trulicity were continued. He was started on farxiga. Would consider up titrating trulicity and farxiga if he is still above goal at his next HgbA1c check.  2.  Labs / imaging needed at time of follow-up: BMP  3.  Pending labs/ test needing follow-up: Please follow up on outpatient TEE results as well as follow up hypercoagulability labs.  Follow-up Appointments:  Follow-up Information     [facility] Follow up on 04/25/2021.   Specialty: Rehabilitation Why: Your appointment is at 8  am. Contact information: [address] [id number] [place] [address] [phone]        McCue, Jessica, NP. Go on 04/19/2021.   Specialty: Neurology Why: stroke clinic Contact information: [address] [employer] [place] [address] [phone]         Braswell, Phillip, MD Follow up in 2 week(s).   Specialty: Internal Medicine Contact information: [address] [place] [address] [phone]         Varanasi, Jayadeep S, MD .   Specialties: Cardiology, Radiology, Interventional Cardiology Contact information: [address] [place]  [address] [phone]                 Hospital Course by problem list: 1. Acute Multifocal Cryptogenic Stroke Dysphagia Possible Eliquis failure Hx 2 prior CVAs with reported RHB weakness Hx Xeralto Failure [name] Is a 50 yo male with PMHx of 2 prior CVAs (last one this Feb) with reported residual RHB weakness, atrial fibrillation on Eliquis (previously failed Xeralto), HTN, HLD, T2DM, tobacco use, OSA, and narcolepsy who presented to the ED on 6/30 with a 1-2 day history of dysphagia and Code Stroke was initiated. MRI Brain showed new acute L pontine and L temporal lobe infarcts with slight associated edema without mass-effect.  CTA head and neck without LVO or high grade stenosis. UDS positive for cocaine. tPA was not given.  Neurology was consulted.  Patient failed the bedside swallow test though the following morning SLP evaluated patient and recommended regular diet with thin liquids given improvement in swallowing. Stroke work up with LDL 49, Hgb A1c 8.2%, and TTE with EF 60-65% and no LAA noted. The patient was worked up for hypercoagulability and those labs are pending at time of discharge. Patient had BLE venous dopplers to rule out DVT which showed not DVT in either limb. Patient endorsed adherence to Eliquis as prescribed. For etiology, CVA possibly due to  synchronized vessel disease given location and uncontrolled risk factors (HTN, cocaine, DM), though cardioembolic stroke still should be considered given different vessel territory. Patient was started on ASA 81mg daily in addition to Eliquis which was restarted during his hospital stay given his new strokes were small and the risk for hemorrhagic conversion was low. Neurology recommended patient get a TEE to further workup etiology which we will schedule out patient. Patient was evaluated by PT/OT and PT recommends neuro OPPT. Patient was discharged on 7/2. Patient will follow up with OP stroke clinic and PCP clinic.   Atrial Fibrillation s/p ablation (11/2020) CHADSVAS score 4 Possible Eliquis Failure Previously Failed Xeralto Patient has history of a fib and takes metoprolol succinate and flecainide for rhythm control. While in the hospital we continued his flecainide 100 BID for rhythm control but held his metoprolol succinate given his BP 120-130s systolic and we did not want to drop BP too far given acute stroke. Patient was   restarted on Eliquis given his new strokes were small and the risk for hemorrhagic conversion was low. Patient has previously tried Nutritional therapist but failed this medication and was transitioned to Eliquis. Patient endorses adherence to Eliquis therefore it is possible that he has also failure anticoagulation with Eliquis given new stroke while on appropriate anticoagulation.    Hypertension Patient has a history of hypertension and is on metoprolol succinate 139m and Olmesartan-Amlodipine-HTCZ 40-10-25 daily. Patient's Bps were 1400-867Ysystolic during admission, therefore his BP medications were held initially during his hospital stay to prevent a drop in blood pressure in the setting of acute stroke. On the morning of 7/2 we restarted his metoprolol succinate. The patient will restart his amlodipine two days after discharge (07/04).   Hyperlipidemia Patient has history of HLD  and takes Atorvastatin 853mat home. A lipid panel was checked at admission as part of stroke workup and LDL was 49 which is at goal ( goal for stroke prevention <70 ). Therefore we continued patient on his home stain this admission. Patient will continue to take his home statin medication after discharge.   T2DM Patient has a history of type 2 diabetes mellitus for which he is on Metformin and Trulicity at home. His Hgb A1c was checked at admission as part of stroke workup and was 8.2% which is above goal. Goal for stroke prevention is <7. Patient was started on sliding scale insulin q6hr for blood glucose management while inpatient. Patient is to resume home diabetes medication after discharge. We will add to a third diabetes medication, dapagliflozin propanediol to his diabetes medication regime which was started while an inpatient and he will continue to take after discharge.   Elevated creatinine Hyperkalemia On admission patient was noted to have elevated Cr of 1.46 which was slightly elevated from baseline of 1.3. A repeat Creatine on 7/1 came back at 1.24. On 7/2 his Cr did increase to 1.65. This may be due to contrast induced kidney injury given recent contrast administration (CTA) vs normal day to day fluctuation/variation. We will have the patient follow up with the PCP and plan to recheck a BMP in the outpatient setting. Initial potassium at admission was 5.6, repeat potassium on 7/1 was 4.9 and has remained within normal limits the rest of his admission, therefore his hyperkalemia has resolved.   HFpEF Patient had echo completed in Feb 2022 with EF of 60-65%. His weight was at baseline at admission. On exam the patient did not appear fluid overloaded, therefore the patient was not started on diuretics this admission. His fluid status remained stable throughout his admission.   Narcolepsy Patient has history of narcolepsy and is on home meds Adderall and modafinil. The patient told  provider that he only takes these medications PRN when he has to leave the house. He stated he would not need to take either medication while he is at the hospital, therefore we held these medications. The patient will continue to take these medications PRN after discharge.    Substance Use Disorder On admission patient tested positive for cocaine on UDS. Patient told providers he did do cocaine a week ago but has not done cocaine for a long time prior to this past week. Patient was counseled on dangers of cocaine use and risk for CVA. Social work provided patient with resources for substance use prevention and cessation which patient declined.  Subjective:   No acute events or concerns overnight.  Patient examined this morning by care team with patient seated  in chair. Patient denies difficulty eating and drinking. He does report that he has not had a BM since before admission. He states he was given something to help have a BM last night but it did not work. Patient was encouraged to continue to ambulate to help with movement of bowels. He does report walking around the patient room without difficulty with balance or dizziness.   Discharge Exam:   BP 117/67 (BP Location: Right Arm)   Pulse 71   Temp 97.7 F (36.5 C) (Oral)   Resp (!) 22   Ht 5' 9" (1.753 m)   Wt (!) 142.3 kg   SpO2 98%   BMI 46.33 kg/m   Discharge exam: General: NAD, well appearing middle aged african [place] male Eyes: no scleral icterus, conjunctiva clear HENT: atraumatic, moist mucus membranes CV: regular rate and rhythm, no murmurs, no LEE Pulmonary: lungs clear to auscultation bilaterally, no wheezing, rales, rhonchi Abdominal:  Bowel sounds present, non distended, non tender to palpation Skin: warm and dry, no rash or lesions on visible surfaces Neuro: Mental Status: awake alert and oriented x3, normal fund of knowledge, memory intact Speech: possible dysarthria CNs: 3,4,6 tracks examiner, eyes move in  all directions 7 face appears symmetric 8 hearing intact 12 tongue midline   Motor: moves all 4 extremities antigravity  Pertinent Labs, Studies, and Procedures:    Hgb A1c: pending HIV non reactive RPR non reactive LDL 49 HDL 23 (L) Triglycerides 210 (H) UDS positive for cocaine UA notable for 100 protein, Hgb moderate without RBCs  CBC Latest Ref Rng & Units 04/15/2021 04/14/2021 11/21/2020  WBC 4.0 - 10.5 K/uL 10.1 10.8(H) 8.2  Hemoglobin 13.0 - 17.0 g/dL 12.3(L) 12.9(L) 12.9(L)  Hematocrit 39.0 - 52.0 % 36.8(L) 38.5(L) 37.5(L)  Platelets 150 - 400 K/uL 265 277 273    BMP Latest Ref Rng & Units 04/16/2021 04/15/2021 04/14/2021  Glucose 70 - 99 mg/dL 218(H) 107(H) -  BUN 6 - 20 mg/dL 30(H) 23(H) -  Creatinine 0.61 - 1.24 mg/dL 1.65(H) 1.24 -  BUN/Creat Ratio 9 - 20 - - -  Sodium 135 - 145 mmol/L 135 139 -  Potassium 3.5 - 5.1 mmol/L 3.9 4.0 4.9  Chloride 98 - 111 mmol/L 101 106 -  CO2 22 - 32 mmol/L 23 25 -  Calcium 8.9 - 10.3 mg/dL 8.7(L) 9.2 -     MRI Brain 6/30: IMPRESSION: 1. Acute infarcts in the pons, posterior left temporal lobe white matter, and anteromedial left frontal white matter. Slight associated edema without mass effect. Given involvement of multiple vascular territories, consider a central embolic etiology. 2. Remote bilateral frontoparietal white matter infarcts and remote left pontine infarct. 3. Age advanced chronic microvascular ischemic disease.  By: Margaretha Sheffield MD  CTA Head and Neck 6/30: IMPRESSION: 1. No emergent large vessel occlusion or high-grade stenosis of the head or neck. 2. Multiple white matter infarcts of mixed ages, including recent ones demonstrated by MRI. By: Ulyses Jarred [name]  Echo 7/1:  1. Technically difficult echo with poor image quality. 2. Left ventricular ejection fraction, by estimation, is 60 to 65%. The left ventricle has normal function. The left ventricle has no regional wall motion abnormalities. There is severe  asymmetric left ventricular hypertrophy. Left ventricular diastolic parameters were normal. 3. Right ventricular systolic function is normal. The right ventricular size is normal. 4. The mitral valve is grossly normal. Trivial mitral valve regurgitation. 5. The aortic valve was not well visualized. Aortic valve regurgitation  is not visualized. 6. Aortic dilatation noted. There is mild dilatation of the ascending aorta, measuring 40 mm. By: Philip Naser MD  Bilateral LE Dopplers 7/2: No evidence of DVT either limb Gregory Collins RVT   Discharge Instructions:  Discharge Instructions     Ambulatory referral to Physical Therapy   Complete by: As directed    Call MD for:  persistant dizziness or light-headedness   Complete by: As directed    Diet - low sodium heart healthy   Complete by: As directed    Increase activity slowly   Complete by: As directed        Signed: Eva Zinoviev, MD 04/16/2021, 3:31 PM   Pager: [phone] 

## 2021-04-16 NOTE — Care Management (Signed)
Spoke w patient at bedside. Provided with Marcelline Deist free trail card and copay reduction card

## 2021-04-16 NOTE — Progress Notes (Signed)
Bilateral lower extremity venous duplex has been completed. Preliminary results can be found in CV Proc through chart review.   04/16/21 10:12 AM Olen Cordial RVT

## 2021-04-17 ENCOUNTER — Other Ambulatory Visit: Payer: Self-pay | Admitting: Internal Medicine

## 2021-04-17 DIAGNOSIS — E1165 Type 2 diabetes mellitus with hyperglycemia: Secondary | ICD-10-CM

## 2021-04-17 MED ORDER — METFORMIN HCL 500 MG PO TABS: 1000.0000 mg | ORAL_TABLET | Freq: Two times a day (BID) | ORAL | 1 refills | Status: DC

## 2021-04-17 NOTE — Progress Notes (Unsigned)
1. Type 2 diabetes mellitus with hyperglycemia, without long-term current use of insulin (HCC)  Refilled  - metFORMIN (GLUCOPHAGE) 500 MG tablet; Take 2 tablets (1,000 mg total) by mouth 2 (two) times daily with a meal.  Dispense: 120 tablet; Refill: 1

## 2021-04-18 LAB — HOMOCYSTEINE: Homocysteine: 15.5 umol/L — ABNORMAL HIGH (ref 0.0–14.5)

## 2021-04-18 LAB — CARDIOLIPIN ANTIBODIES, IGG, IGM, IGA
Anticardiolipin IgA: <9 APL U/mL (ref 0–11)
Anticardiolipin IgG: <9 GPL U/mL (ref 0–14)
Anticardiolipin IgM: 14 MPL U/mL — ABNORMAL HIGH (ref 0–12)

## 2021-04-18 LAB — BETA-2-GLYCOPROTEIN I ABS, IGG/M/A
Beta-2 Glyco I IgG: <9 GPI IgG units (ref 0–20)
Beta-2-Glycoprotein I IgA: <9 GPI IgA units (ref 0–25)
Beta-2-Glycoprotein I IgM: <9 GPI IgM units (ref 0–32)

## 2021-04-19 ENCOUNTER — Telehealth: Payer: Self-pay | Admitting: Student

## 2021-04-19 ENCOUNTER — Ambulatory Visit (INDEPENDENT_AMBULATORY_CARE_PROVIDER_SITE_OTHER): Payer: 59 | Admitting: Adult Health

## 2021-04-19 ENCOUNTER — Other Ambulatory Visit: Payer: Self-pay

## 2021-04-19 ENCOUNTER — Other Ambulatory Visit (HOSPITAL_COMMUNITY): Payer: Self-pay | Admitting: *Deleted

## 2021-04-19 ENCOUNTER — Encounter: Payer: Self-pay | Admitting: Adult Health

## 2021-04-19 VITALS — BP 170/90 | HR 66 | Ht 69.0 in | Wt 313.0 lb

## 2021-04-19 DIAGNOSIS — I10 Essential (primary) hypertension: Secondary | ICD-10-CM

## 2021-04-19 DIAGNOSIS — I639 Cerebral infarction, unspecified: Secondary | ICD-10-CM

## 2021-04-19 DIAGNOSIS — G4733 Obstructive sleep apnea (adult) (pediatric): Secondary | ICD-10-CM

## 2021-04-19 DIAGNOSIS — I69391 Dysphagia following cerebral infarction: Secondary | ICD-10-CM

## 2021-04-19 DIAGNOSIS — I69398 Other sequelae of cerebral infarction: Secondary | ICD-10-CM | POA: Diagnosis not present

## 2021-04-19 DIAGNOSIS — R1310 Dysphagia, unspecified: Secondary | ICD-10-CM

## 2021-04-19 DIAGNOSIS — E785 Hyperlipidemia, unspecified: Secondary | ICD-10-CM

## 2021-04-19 DIAGNOSIS — I480 Paroxysmal atrial fibrillation: Secondary | ICD-10-CM | POA: Diagnosis not present

## 2021-04-19 DIAGNOSIS — E1165 Type 2 diabetes mellitus with hyperglycemia: Secondary | ICD-10-CM

## 2021-04-19 DIAGNOSIS — R269 Unspecified abnormalities of gait and mobility: Secondary | ICD-10-CM

## 2021-04-19 NOTE — Progress Notes (Deleted)
HPI  male smoker followed for management of OSA and narcolepsy with cataplexy, complicated by allergic rhinitis, atrial fibrillation, dCHF, DM, HTN NPSG 11/23/00- AHI 20 per hour. Hypnagogic hallucination associated with dreaming. 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 Vivid dreams as soon as he falls asleep Multiple Sleep Latency Test 10/30/2012-pathologic daytime hypersomnia, nonspecific, compatible with idiopathic hypersomnia or narcolepsy. Mean latency 0.9 minutes with one sleep onset REM event CT chest 12/05/19- mediastinal and bilateral adenopathy, interstitial prominence, ASCVD ---------------------------------------------------------------------------------------  03/01/21- 50 year old male Smoker (14 pkyrs)  followed for management of OSA and narcolepsy with cataplexy,, Mediastinal / Hilar Adenopathy, ILD,  complicated by allergic rhinitis, atrial fibrillation/Eliquis flecainide/ Xarelto, dCHF, Diastolic Dysfunction, CAD, CVA, DM2, HTN, Pancreatitis, Obesity, Tobacco use,  - Adderall XR 30 mg, 1 daily CPAP auto 4-20/ [employer]-   Body weight today- 306 lbs Covid vax- 3Phizer -----Winded easily, stroke in Feb 2022, fatigued easily, not wearing CPAP Still smoking 1/2 ppd.  Hasn't been able to tolerate CPAP. Sleeps poorly at night. Fights daytime sleepiness. More intrusive Cataplexy, triggered by startle, laughter. Adderall doesn't last past midmorning. Used Benedetto Goad to get here today.  04/19/21- 50 year old male Smoker (14 pkyrs)  followed for management of OSA and narcolepsy with cataplexy,, Mediastinal / Hilar Adenopathy, ILD,  complicated by allergic rhinitis, atrial fibrillation/Eliquis flecainide/ Eliquis, dCHF, Diastolic Dysfunction, CAD, Recurrent CVA, DM2, HTN, Pancreatitis, Obesity, Tobacco use,  - Adderall XR 30 mg, 1 daily CPAP auto 4-20/ [employer]- Body weight today- Covid vax- Hosp 6/30-04/16/21- recurrent CVA   ROS-see HPI  + = positive Constitutional:   No-    weight loss, night sweats, fevers, +chills,  +fatigue, lassitude. HEENT:   No-  headaches, difficulty swallowing, tooth/dental problems, sore throat,       No-  sneezing, itching, ear ache, + nasal congestion, post nasal drip,  CV:  No-   chest pain, orthopnea, PND, swelling in lower extremities, anasarca, dizziness, palpitations Resp: + shortness of breath with exertion or at rest.              No-   productive cough,  No non-productive cough,  No- coughing up of blood.              No-   change in color of mucus.  No- wheezing.   Skin: No-   rash or lesions. GI:  No-   heartburn, indigestion, abdominal pain, nausea, vomiting,  GU:  MS:  No-   joint pain or swelling.  Neuro-     +HPI Psych:  No- change in mood or affect. No depression or anxiety.  No memory loss.  OBJ- Physical Exam   General- Alert/ calm, Oriented, Affect-appropriate, Distress- none acute, +morbidly obese, +extremey drowsy initially with me in room, then woke up and became much more interactive. with appropriate questions.  Skin- rash-none, lesions- none, excoriation- none Lymphadenopathy- none Head- atraumatic            Eyes- Gross vision intact, PERRLA, conjunctivae and secretions clear            Ears- Hearing, canals-normal            Nose- rhinitis/ turbinate edema, no-Septal dev, mucus, polyps, erosion, perforation             Throat- Mallampati III , mucosa clear , drainage- none, tonsils- atrophic Neck- flexible , trachea midline, no stridor , thyroid nl, carotid no bruit Chest - symmetrical excursion , unlabored           Heart/CV-  RRR today, no murmur , no gallop  , no rub, nl s1 s2                           - JVD- none , edema- none, stasis changes- none, varices- none           Lung- clear to P&A, wheeze- none, cough- none , dullness-none, rub- none           Chest wall-  Abd-  Br/ Gen/ Rectal- Not done, not indicated Extrem- no edema

## 2021-04-19 NOTE — Progress Notes (Signed)
[facility] [address] [place]. [name] [address] [phone]       STROKE FOLLOW UP NOTE  [name] Date of Birth:  1970/12/03 Medical Record Number:  [id number]   Reason for Referral: stroke follow up    SUBJECTIVE:   CHIEF COMPLAINT:  Chief Complaint  Patient presents with   Follow-up    RM 3 alone Pt is well, had another stroke last week but no new complications      HPI:   Today, 04/19/2021, [name] returns for scheduled stroke follow-up visit but unfortunately had recent stroke on 04/14/2021.  He presented with worsening slurred speech and swallowing difficulty with MRI showing small right pontine, left frontal white matter and left temporal white matter infarcts.  CTA head/neck unremarkable.  LDL 49, TG 210.  A1c 8.2.  UDS positive for cocaine.  2D echo showed EF of 60 to 65%.  Etiology possibly synchronized small vessel disease given locations and uncontrolled risk factors (HTN, DM, tobacco use, OSA and cocaine) although cardioembolic etiology cannot be ruled out given vessel territory.  Recommended continued use of Eliquis as well as adding aspirin 81 mg daily and continuation of atorvastatin 80 mg daily.  Recommended TEE to rule out large PFO or cardiac tumor in setting of recurrent strokes on Eliquis however recommended outpatient due to holiday weekend.  LE venous Doppler negative.  Hypercoagulable labs pending at discharge.  Evaluated by therapies who recommended outpatient PT for decreased mobility and imbalance.   Since discharge, he continues to experience greater swallowing difficulty with thin liquids.  Denies any difficulty with solid foods.  He believes speech has returned back to baseline.  Right-sided weakness and gait stable.  Bedside SLP swallow eval during hospitalization cleared for regular diet and thin liquids as swallowing within functional limits.  He has not yet started PT and questions why he needs this.  He was  previously working with neuro's which were completed in April.  He continues to live with his sister who assists with IADLs but is able to maintain ADLs independently. Denies new or worsening stroke/TIA symptoms since discharge  Reports compliance on Eliquis, aspirin and atorvastatin without associated side effects Blood pressure today 170/90 - does not routinely monitor at home  Glucose levels routinely monitored at home and typically greater than 200 (per pt report) -reports compliance on diabetic regimen Routinely follows with pulmonology Dr. Annamaria Boots for sleep apnea and narcolepsy -recent OV ntoe personally reviewed with reported noncompliance for CPAP treatment and plans on repeating split-night study to establish control of OSA - he has not yet completed but does report since recent stroke, he has restarted his CPAP He has tried to smoke cigarettes but reports having difficulty inhaling since recent stroke -previously smoking 1 pack/day.  Currently using nicotine patch. He denies any additional cocaine use and reports one-time use the week of his stroke - he does not plan on using again in the future   Hypercoagulable labs reviewed which showed slightly elevated anticardiolipin IgM 14 and slightly elevated homocystine 15.5.  Beta-2 glycoprotein negative.  Lupus anticoagulant remains pending.      History provided for reference purposes only Update 12/14/2020 JM: [name] returns for stroke follow-up unaccompanied.  He was initially scheduled 11/18/2020 for routine follow-up but left prior to being seen as he was late for appointment.  He was seen by his PCP that day with complaints of 2-day onset of worsening right-sided weakness and dysarthria.  Initially planned on  doing further work-up outpatient as symptoms present >48 hours but lab work showed hyperkalemia therefore PCP advised pt to proceed to ED for further management on 11/19/2020.  MRI showed acute infarction in the left pons, right lateral  splenium of the corpus callosum and medial left temporal lobe/hippocampus felt likely microembolic with history of A. fib despite compliance on Xarelto.  MRA head/neck unremarkable.  Due to concern of Xarelto failure, switched to Eliquis 5 mg twice daily.  Aspirin 81 mg daily also initiated.  Potassium levels stabilized with medication adjustments and interventions and advised outpatient follow-up with PCP.  Since discharge, reports continued right sided weakness, dysarthria, gait impairment with imbalance, cognitive impairment, and possible dysphagia.  SLP reached out prior to todays visit with pt reporting concern of occasional coughing with water - no difficulty with food but typically difficulty with water - SLP [name] requested MBS for further evaluation Currently working with neuro rehab PT/OT/SLP He is currently awaiting for rolling walker to help him ambulate longer distances -currently ambulating without assistive device but denies any recent falls Denies new or worsening stroke/TIA symptoms  Reports compliance on Eliquis 5 mg twice daily and aspirin 81 mg daily-denies side effects Reports compliance on atorvastatin 80 mg daily -denies side effects Blood pressure today 160/96 - does not routinely monitor at home but has been elevated during therapy visits Glucose levels not currently monitored at home as he recently ran out of lancets and plans on contacting PCP for refill  He continues to follow with pulmonology Dr. Annamaria Boots for history of narcolepsy with cataplexy and sleep apnea but plans on pursuing CPAP/BiPAP titration due to difficulty tolerating CPAP.  Titration study scheduled 01/16/2021.   Lab work 11/19/2020 A1c 7.7 (down from 9.9) LDL 102 -  Increased atorvastatin from 40 mg to 80 mg daily  Continued tobacco use approx 1 pack/week with eventual goal of complete cessation  No further concerns at this time   Initial visit 08/09/2020 JM: [name] is being seen for hospital follow-up  unaccompanied.  Reports residual mild to moderate speech difficulty, short-term memory impairment, imbalance and visual impairment. Denies residual right sided weakness.  He has not worked with any therapy but PCP placed referral to initiate physical and speech therapy but he has not scheduled visits at this time.  Reports worsening vision post stroke with underlying history of diabetic retinopathy.  Denies new or worsening stroke/TIA symptoms.  He reports ongoing compliance with Xarelto and denies bleeding or bruising.  Reports compliance with atorvastatin and denies myalgias.  Blood pressure today satisfactory at 127/77.  He does not routinely monitor at home.  Glucose level stable per patient.  He does report excessive daytime fatigue but has been noncompliant with CPAP over the past couple months due to increased anxiety and "fear of choking".  He has not further discuss this with his pulmonologist.  Prescribed Adderall for history of narcolepsy but he has not taken since July as he has not been working although per epic reviewed, pulmonologist recently refilled Adderall prescription.  No further concerns at this time.   Stroke admission 06/01/2020 [name] is a 50 y.o. male with history of atrial fibrillation, OSA on CPAP, narcolepsy, diabetes, hypertension, morbid obesity, GERD, history of polysubstance abuse including cocaine and alcohol  who presented on 06/01/2020 with persistent dysarthric speech.  Stroke work-up revealed small L caudate body infarct embolic secondary to known AF source not consistently taking Xarelto.  Recommended continuation of Xarelto consistently for secondary stroke prevention.  HTN stable.  LDL 103 and increase atorvastatin to 40 mg daily.  Uncontrolled DM with A1c 9.9.  Other stroke risk factors include tobacco use, hx ETOH and substance abuse, obesity, family history of stroke and OSA on CPAP.  No prior personal history of stroke.  Evaluated by therapy without  PT/OT needs but recommended outpatient SLP for residual cognitive impairment and mild to moderate dysarthria  Stroke:   Small L caudate body infarct embolic secondary to known AF source not consistently taking Xarelto CT head focal hyperdensities B corona radiata ? Acute infarct, prominent Small vessel disease and Atrophy.  MRI  Small L caudate body infarct. Small vessel disease. Atrophy.  MR CS Unremarkable  MRA head Unremarkable  MRA neck Unremarkable  2D Echo EF 60-65%. No source of embolus. LA mildly dilated LDL 103 HgbA1c 9.9 VTE prophylaxis - SCDs   Xarelto (rivaroxaban) daily prior to admission, now on aspirin 325 mg daily. Not taking xarelto as prescribed - resume xarelto and stop aspirin  Therapy recommendations:  HH SLP Disposition:  home      ROS:   14 system review of systems performed and negative with exception of those listed in HPI  PMH:  Past Medical History:  Diagnosis Date   Acute pancreatitis 01/22/2019   Diabetes mellitus    GERD (gastroesophageal reflux disease)    History of alcohol abuse    History of cocaine use    History of echocardiogram    a. Echo 4/14: Moderate LVH, vigorous LVEF, EF 65-70%, normal wall motion, grade 2 diastolic dysfunction, mildly dilated aortic root and ascending aorta, ascending aorta 40 mm, aortic root 38 mm, mild LAE   Hx of cardiovascular stress test    a. GXT 5/14: No ischemic changes  //  b. ETT-Myoview 3/16:  Low risk, no ischemia, EF 58%   Hyperlipidemia    Hypertension    Morbid obesity ([place])    Paroxysmal atrial fibrillation ([place])    Occurring in 2008, with several recurrence since then (including in the setting of + cocaine on UDS).   Sleep apnea    uses CPAP   SVT (supraventricular tachycardia) ([place])    mid to long RP SVT 09/2019    PSH:  Past Surgical History:  Procedure Laterality Date   APPENDECTOMY     ATRIAL FIBRILLATION ABLATION N/A 11/27/2019   Procedure: ATRIAL FIBRILLATION ABLATION;  Surgeon: Thompson Grayer, MD;  Location: [facility];  Service: Cardiovascular;  Laterality: N/A;   ROTATOR CUFF REPAIR     SVT ABLATION N/A 11/27/2019   Procedure: SVT ABLATION;  Surgeon: Thompson Grayer, MD;  Location: [facility];  Service: Cardiovascular;  Laterality: N/A;   TONSILLECTOMY      Social History:  Social History   Socioeconomic History   Marital status: Single    Spouse name: Not on file   Number of children: 0   Years of education: 12   Highest education level: Not on file  Occupational History   Occupation: Unemployed    Employer: UNEMPLOYED  Tobacco Use   Smoking status: Every Day    Packs/day: 0.10    Years: 28.00    Pack years: 2.80    Types: Cigarettes   Smokeless tobacco: Former    Types: Snuff   Tobacco comments:    1 pack per week   Vaping Use   Vaping Use: Never used  Substance and Sexual Activity   Alcohol use: Not Currently    Alcohol/week: 6.0  standard drinks    Types: 6 Standard drinks or equivalent per week    Comment: monthly   Drug use: Not Currently    Comment: cocaine in the past, none currently   Sexual activity: Not on file  Other Topics Concern   Not on file  Social History Narrative   Fun: Designer, fashion/clothing       Lives in [place] with sister.      Unemployed, was working a Land job   Social Determinants of Sales executive: Not on file  Food Insecurity: Not on file  Transportation Needs: Not on file  Physical Activity: Not on file  Stress: Not on file  Social Connections: Not on file  Intimate Partner Violence: Not on file    Family History:  Family History  Problem Relation Age of Onset   Diabetes Mother    Heart attack Father 49   Stroke Maternal Grandmother    Stroke Paternal Grandmother    Diabetes Paternal Grandfather     Medications:   Current Outpatient Medications on File Prior to Visit  Medication Sig Dispense Refill   Accu-Chek Softclix Lancets lancets TEST UP TO 4 TIMES A DAY 100 each  2   amphetamine-dextroamphetamine (ADDERALL XR) 30 MG 24 hr capsule Take 1 capsule (30 mg total) by mouth daily as needed (focus). 1 daily as needed 30 capsule 0   apixaban (ELIQUIS) 5 MG TABS tablet Take 1 tablet (5 mg total) by mouth 2 (two) times daily. 90 tablet 3   aspirin EC 81 MG EC tablet Take 1 tablet (81 mg total) by mouth daily. Swallow whole. 30 tablet 11   atorvastatin (LIPITOR) 80 MG tablet Take 1 tablet (80 mg total) by mouth daily. 90 tablet 3   blood glucose meter kit and supplies KIT Dispense based on patient and insurance preference. Use up to four times daily as directed. 1 each 0   Blood Pressure Monitoring (BLOOD PRESSURE MONITOR/L CUFF) MISC 1 Units by Does not apply route daily. 1 each 0   dapagliflozin propanediol (FARXIGA) 5 MG TABS tablet Take 1 tablet (5 mg total) by mouth daily. 30 tablet 1   Dulaglutide (TRULICITY) 9.02 IO/9.7DZ SOPN Inject 0.75 mg into the skin every Monday.     flecainide (TAMBOCOR) 100 MG tablet Take 1 tablet (100 mg total) by mouth 2 (two) times daily. Please make yearly appt with Dr. Rayann Heman for June 2022 before anymore refills. Thank you 1st attempt 60 tablet 1   metFORMIN (GLUCOPHAGE) 500 MG tablet Take 2 tablets (1,000 mg total) by mouth 2 (two) times daily with a meal. 120 tablet 1   metoprolol succinate (TOPROL XL) 100 MG 24 hr tablet Take 1 tablet (100 mg total) by mouth daily. Take with or immediately following a meal. 30 tablet 11   modafinil (PROVIGIL) 200 MG tablet Take 1 tablet (200 mg total) by mouth daily. 30 tablet 0   nicotine (NICODERM CQ - DOSED IN MG/24 HOURS) 21 mg/24hr patch PLACE 1 PATCH (21 MG TOTAL) ONTO THE SKIN DAILY. 28 patch 1   Olmesartan-amLODIPine-HCTZ 40-10-25 MG TABS Take 1 tablet by mouth daily. Please restart this medication on 04/18/2021 90 tablet 3   pantoprazole (PROTONIX) 40 MG tablet Take 1 tablet (40 mg total) by mouth daily. 30 tablet 5   No current facility-administered medications on file prior to visit.     Allergies:   Allergies  Allergen Reactions   Shrimp [Shellfish Allergy] Anaphylaxis   Banana Other (  See Comments)    Pt gags   Watermelon Flavor Nausea And Vomiting   Other Itching    Grapes cause itching   Almond Oil Itching    Roof of mouth itches   Peanut-Containing Drug Products Itching and Cough   Sulfa Antibiotics Itching      OBJECTIVE:  Physical Exam  Vitals:   04/19/21 0830  BP: (!) 170/90  Pulse: 66  Weight: (!) 313 lb (142 kg)  Height: $Remove'5\' 9"'HVuhGuL$  (1.753 m)    Body mass index is 46.22 kg/m. No results found.  General: Morbidly obese pleasant middle-aged African-American male, seated, in no evident distress Head: head normocephalic and atraumatic.   Neck: supple with no carotid or supraclavicular bruits Cardiovascular: regular rate and rhythm, no murmurs Musculoskeletal: no deformity Skin:  no rash/petichiae Vascular:  Normal pulses all extremities   Neurologic Exam Mental Status: Awake and fully alert. Mild dysarthria with mild baseline stuttering.  Oriented to place and time. Recent memory impaired and remote memory intact. Attention span, concentration and fund of knowledge appropriate during visit. Mood and affect flat.  Cranial Nerves: Pupils equal, briskly reactive to light. Extraocular movements full without nystagmus. Visual fields full to confrontation. Hearing intact. Facial sensation intact. R lower facial weakness. Tongue and palate moves normally and symmetrically.  Motor: Normal bulk and tone. Normal strength in all tested extremity muscles except slight weakness right grip strength. Sensory.: intact to touch , pinprick , position and vibratory sensation.  Coordination: Rapid alternating movements normal in all extremities except decreased right hand dexterity. Finger-to-nose and heel-to-shin performed accurately bilaterally. Gait and Station: Arises from chair without difficulty. Stance is normal. Gait demonstrates  shortened stride length and  step height with mild unsteadiness and imbalance without use of assistive device.  Tandem walk and heel toe not attempted. Reflexes: 1+ and symmetric. Toes downgoing.         ASSESSMENT: Douglas Bastos. is a 50 y.o. year old male with history of left caudate head stroke likely in setting of known AF with Xarelto noncompliance 1/61/0960, multiple embolic infarcts within L pons, left temporal lobe and left lateral splenium of corpus callosum 11/19/2020 likely in setting of known AF despite Xarelto compliance therefore switched to Eliquis and recent right pontine, left frontal WM and left temporal WM infarcts 04/14/2021 likely in setting of synchronized small vessel disease although cardioembolic source cannot be completely ruled out. Vascular risk factors include A. Fib, HTN, HLD, DM, OSA with CPAP intolerance, morbid obesity, current tobacco use, cocaine use and EtOH use.      PLAN:  Multiple recurrent strokes:  Residual deficit: Right hand weakness, gait impairment with imbalance, dysarthria,cognitive impairment and subjective dysphagia Order placed for MBS for further evaluation of possible dysphagia in setting of recent recurrent stroke May consider need of SLP depending on MBS Referral placed to electrophysiology to complete TEE to rule out PFO or cardiac tumor Hypercoagulable labs: Slightly elevated anticardiolipin IgM 14 (goal 0-12) and homocystine 15.5 (goal 0-14.5).  Lupus anticoagulant pending. Will plan on repeating labs once LA resulted Continue aspirin 81 mg daily and Eliquis (apixaban) daily  and atorvastatin 80 mg daily for secondary stroke prevention Discussed secondary stroke prevention measures and importance of close PCP follow up for aggressive stroke risk factor management  Atrial fibrillation:  Continue Eliquis 5 mg twice daily for CHA2DS2-VASc score of at least 4 In setting of recent stroke, likely Xarelto failure therefore switched to Eliquis 5 mg twice  daily Has not had recent  follow-up with cardiology -advised patient to contact office to schedule follow-up visit HTN: BP goal <130/90.  Uncontrolled on current regimen -encouraged monitoring at home as BP may be elevated with todays visit -he was encouraged to reschedule follow-up with PCP/cardiology for further evaluation HLD: LDL goal <70. Recent LDL 49 on atorvastatin 80 mg daily monitored and managed by PCP DMII: A1c goal<7.0. Recent A1c 8.2 -continue to follow-up with PCP and to discuss possible evaluation by endocrinology for difficult to control diabetes OSA, narcolepsy: Followed by Dr. Annamaria Boots.  Reports compliance with CPAP after recent stroke Tobacco abuse: Discussed importance of complete tobacco cessation -current use nicotine patches Cocaine abuse: UDS positive 04/2021.  Discussed importance of complete abstinence due to increased risk of additional strokes and other health related concerns with recurrent use -he verbalizes understanding    Follow up in 3 months or call earlier if needed   CC:  [facility] provider: Dr. Halford Chessman, MD     I spent 56 minutes of face-to-face and non-face-to-face time with patient.  This included previsit chart review including recent hospitalization pertinent progress notes, lab work and imaging, lab review, study review, order entry, electronic health record documentation, and prolonged patient education and discussion regarding recent recurrent stroke and multiple prior strokes with secondary stroke prevention measures and aggressive stroke risk factor management as above, residual deficits and answered all other questions to patients satisfaction   Frann Rider, AGNP-BC  [facility] [address] [place], [place] [address]  Phone [phone] Fax [phone] Note: This document was prepared with digital dictation and possible smart phrase technology. Any transcriptional errors that result from this  process are unintentional.

## 2021-04-19 NOTE — Progress Notes (Signed)
Internal Medicine Clinic Attending ? ?Case discussed with Dr. Braswell  At the time of the visit.  We reviewed the resident?s history and exam and pertinent patient test results.  I agree with the assessment, diagnosis, and plan of care documented in the resident?s note.  ?

## 2021-04-19 NOTE — Patient Instructions (Signed)
Continue aspirin 81 mg daily and Eliquis (apixaban) daily  and atorvastatin  for secondary stroke prevention  You will be called to schedule TEE which will look for other causes of additional strokes  You will be called to schedule swallow study and possible need of restart of speech therapy depending on results  Very important to stop tobacco use as avoid drugs as continued use will greatly increase your risk of additional strokes  Continue to follow up with PCP regarding cholesterol, blood pressure and diabetes management  Maintain strict control of hypertension with blood pressure goal below 130/90, diabetes with hemoglobin A1c goal below 7% and cholesterol with LDL cholesterol (bad cholesterol) goal below 70 mg/dL.       Followup in the future with me in 3 months or call earlier if needed       Thank you for coming to see [place] at [facility]. I hope we have been able to provide you high quality care today.  You may receive a patient satisfaction survey over the next few weeks. We would appreciate your feedback and comments so that we may continue to improve ourselves and the health of our patients.

## 2021-04-19 NOTE — Telephone Encounter (Signed)
TOC HFU APPT [facility] FOR 04/27/2021 @ 10:45 AM WITH DR BRASWELL.

## 2021-04-20 ENCOUNTER — Ambulatory Visit: Payer: 59 | Admitting: Internal Medicine

## 2021-04-20 ENCOUNTER — Other Ambulatory Visit: Payer: Self-pay | Admitting: Student

## 2021-04-20 DIAGNOSIS — I119 Hypertensive heart disease without heart failure: Secondary | ICD-10-CM

## 2021-04-20 DIAGNOSIS — E1165 Type 2 diabetes mellitus with hyperglycemia: Secondary | ICD-10-CM

## 2021-04-20 LAB — LUPUS ANTICOAGULANT PANEL
DRVVT: 63.3 s — ABNORMAL HIGH (ref 0.0–47.0)
PTT Lupus Anticoagulant: 40.9 s (ref 0.0–51.9)

## 2021-04-20 LAB — DRVVT MIX: dRVVT Mix: 50.3 s — ABNORMAL HIGH (ref 0.0–40.4)

## 2021-04-20 LAB — DRVVT CONFIRM: dRVVT Confirm: 1 ratio (ref 0.8–1.2)

## 2021-04-20 MED ORDER — EMPAGLIFLOZIN 10 MG PO TABS
10.0000 mg | ORAL_TABLET | Freq: Every day | ORAL | 0 refills | Status: DC
Start: 2021-04-20 — End: 2021-06-17

## 2021-04-22 ENCOUNTER — Ambulatory Visit (HOSPITAL_COMMUNITY): Admission: RE | Admit: 2021-04-22 | Payer: 59 | Source: Ambulatory Visit

## 2021-04-22 ENCOUNTER — Other Ambulatory Visit: Payer: Self-pay

## 2021-04-25 ENCOUNTER — Other Ambulatory Visit: Payer: Self-pay

## 2021-04-25 ENCOUNTER — Ambulatory Visit: Payer: 59 | Attending: Internal Medicine | Admitting: Physical Therapy

## 2021-04-25 DIAGNOSIS — M6281 Muscle weakness (generalized): Secondary | ICD-10-CM | POA: Insufficient documentation

## 2021-04-25 DIAGNOSIS — R2689 Other abnormalities of gait and mobility: Secondary | ICD-10-CM

## 2021-04-25 DIAGNOSIS — R2681 Unsteadiness on feet: Secondary | ICD-10-CM | POA: Diagnosis present

## 2021-04-25 DIAGNOSIS — I69351 Hemiplegia and hemiparesis following cerebral infarction affecting right dominant side: Secondary | ICD-10-CM

## 2021-04-25 NOTE — Addendum Note (Signed)
Addended by: Jules Husbands MARIE L on: 04/25/2021 09:57 AM   Modules accepted: Orders

## 2021-04-25 NOTE — Therapy (Addendum)
[facility] [address] [place], [place], [address] Phone: [phone]   Fax:  [phone]  Physical Therapy Evaluation  Patient Details  Name: [name]. MRN: [id number] Date of Birth: 03-05-1971 Referring Provider (PT): Reymundo Poll   Encounter Date: 04/25/2021   PT End of Session - 04/25/21 0948     Visit Number 1    Number of Visits 5    Date for PT Re-Evaluation 05/23/21    Authorization Type UHC-60 visit limit PT/OT/speech (each visit of each type counts); 40 TOTAL visits of all therapies in 2022    PT Start Time 0809   Late arrival   PT Stop Time 0845    PT Time Calculation (min) 36 min    Equipment Utilized During Treatment Gait belt    Activity Tolerance Patient tolerated treatment well    Behavior During Therapy WFL for tasks assessed/performed             Past Medical History:  Diagnosis Date   Acute pancreatitis 01/22/2019   Diabetes mellitus    GERD (gastroesophageal reflux disease)    History of alcohol abuse    History of cocaine use    History of echocardiogram    a. Echo 4/14: Moderate LVH, vigorous LVEF, EF 65-70%, normal wall motion, grade 2 diastolic dysfunction, mildly dilated aortic root and ascending aorta, ascending aorta 40 mm, aortic root 38 mm, mild LAE   Hx of cardiovascular stress test    a. GXT 5/14: No ischemic changes  //  b. ETT-Myoview 3/16:  Low risk, no ischemia, EF 58%   Hyperlipidemia    Hypertension    Morbid obesity (HCC)    Paroxysmal atrial fibrillation (HCC)    Occurring in 2008, with several recurrence since then (including in the setting of + cocaine on UDS).   Sleep apnea    uses CPAP   SVT (supraventricular tachycardia) (HCC)    mid to long RP SVT 09/2019    Past Surgical History:  Procedure Laterality Date   APPENDECTOMY     ATRIAL FIBRILLATION ABLATION N/A 11/27/2019   Procedure: ATRIAL FIBRILLATION ABLATION;  Surgeon: [name], MD;  Location: [facility] IN[facility];  Service: Cardiovascular;  Laterality: N/A;   ROTATOR CUFF REPAIR     SVT ABLATION N/A 11/27/2019   Procedure: SVT ABLATION;  Surgeon: [name], MD;  Location: [facility] INVASIVE CV LAB;  Service: Cardiovascular;  Laterality: N/A;   TONSILLECTOMY      There were no vitals filed for this visit.    Subjective Assessment - 04/25/21 0816     Subjective Pt reports he does not feel he needs PT after this past hospitalization. Pt states his biggest problem is balance in the dark but this was an issue with the past stroke as well. Reports everything else seems about the same.    Pertinent History Recent hospitalization 2/4-11/21/20 (Dr. Marvel Plan and RN at d/c recommend OP PT)                [facility] PT Assessment - 04/25/21 0001       Assessment   Medical Diagnosis Acute L CVA    Referring Provider (PT) Reymundo Poll    Onset Date/Surgical Date 04/14/21    Hand Dominance Right    Prior Therapy Last saw neuro PT Apr 2022      Precautions   Precautions Fall      Restrictions   Weight Bearing Restrictions No  Balance Screen   Has the patient fallen in the past 6 months No      Home Environment   Living Environment Private residence    Living Arrangements Other relatives   Sister   Available Help at Discharge Family    Type of Home House    Home Access Level entry    Home Layout One level    Home Equipment Walker - 2 wheels      Prior Function   Level of Independence Independent      Observation/Other Assessments   Focus on Therapeutic Outcomes (FOTO)  55; predicted 66      Sensation   Light Touch --   Reports decrease in sensation (not new) in R LE     Strength   Overall Strength Comments R LE grossly at least 3+/5      Transfers   Sit to Stand 7: Independent    Five time sit to stand comments  11 sec    Stand to Sit 7: Independent      Ambulation/Gait   Ambulation/Gait Assistance 5: Supervision    Assistive device  None    Gait Pattern Step-through pattern;Abducted- right;Right foot flat;Decreased step length - left    Ambulation Surface Level;Indoor    Gait velocity 1.82 ft/sec      Berg Balance Test   Sit to Stand Able to stand without using hands and stabilize independently    Standing Unsupported Able to stand safely 2 minutes    Sitting with Back Unsupported but Feet Supported on Floor or Stool Able to sit safely and securely 2 minutes    Stand to Sit Sits safely with minimal use of hands    Transfers Able to transfer safely, minor use of hands    Standing Unsupported with Eyes Closed Able to stand 10 seconds safely    Standing Unsupported with Feet Together Able to place feet together independently and stand 1 minute safely    From Standing, Reach Forward with Outstretched Arm Can reach confidently >25 cm (10")    From Standing Position, Pick up Object from Floor Able to pick up shoe safely and easily    From Standing Position, Turn to Look Behind Over each Shoulder Looks behind from both sides and weight shifts well    Turn 360 Degrees Able to turn 360 degrees safely one side only in 4 seconds or less    Standing Unsupported, Alternately Place Feet on Step/Stool Able to stand independently and safely and complete 8 steps in 20 seconds    Standing Unsupported, One Foot in Front Able to take small step independently and hold 30 seconds    Standing on One Leg Able to lift leg independently and hold equal to or more than 3 seconds    Total Score 51    Berg comment: 51/56      Functional Gait  Assessment   Gait Level Surface Walks 20 ft, slow speed, abnormal gait pattern, evidence for imbalance or deviates 10-15 in outside of the 12 in walkway width. Requires more than 7 sec to ambulate 20 ft.   very slow gait speed   Change in Gait Speed Able to smoothly change walking speed without loss of balance or gait deviation. Deviate no more than 6 in outside of the 12 in walkway width.    Gait with  Horizontal Head Turns Performs head turns smoothly with no change in gait. Deviates no more than 6 in outside 12 in walkway width  Gait with Vertical Head Turns Performs task with slight change in gait velocity (eg, minor disruption to smooth gait path), deviates 6 - 10 in outside 12 in walkway width or uses assistive device    Gait and Pivot Turn Pivot turns safely within 3 sec and stops quickly with no loss of balance.    Step Over Obstacle Is able to step over one shoe box (4.5 in total height) but must slow down and adjust steps to clear box safely. May require verbal cueing.    Gait with Narrow Base of Support Ambulates less than 4 steps heel to toe or cannot perform without assistance.    Gait with Eyes Closed Walks 20 ft, slow speed, abnormal gait pattern, evidence for imbalance, deviates 10-15 in outside 12 in walkway width. Requires more than 9 sec to ambulate 20 ft.    Ambulating Backwards Walks 20 ft, uses assistive device, slower speed, mild gait deviations, deviates 6-10 in outside 12 in walkway width.    Steps Alternating feet, must use rail.    Total Score 18    FGA comment: 18/30                        Objective measurements completed on examination: See above findings.                 PT Short Term Goals - 04/25/21 0938       PT SHORT TERM GOAL #1   Title STGs = LTGs      PT SHORT TERM GOAL #4   Status --      PT SHORT TERM GOAL #5   Status --               PT Long Term Goals - 04/25/21 0940       PT LONG TERM GOAL #1   Title Pt will be independent with final progression of HEP for improved gait and balance.    Time 4    Period Weeks    Status New    Target Date 05/23/21      PT LONG TERM GOAL #2   Title Pt will improve FGA score to at least 22/30 for decreased fall risk    Baseline 18/30    Time 4    Period Weeks    Status New    Target Date 05/23/21      PT LONG TERM GOAL #3   Title Pt will improve Berg Balance  score to at least 52/56 for decreased fall risk.    Baseline 51/56    Time 4    Period Weeks    Status New    Target Date 05/23/21      PT LONG TERM GOAL #4   Title Pt will improve gait velocity to at least 2 ft/sec for improved gait efficiency and decreased fall risk.    Baseline 1.82 ft/sec    Time 4    Period Weeks    Status New      PT LONG TERM GOAL #5   Title Pt will be able to amb at least 1000' on outdoor surfaces for safer community mobility and for performing yard work    Baseline Currently not mowing lawn for safety    Time 4    Period Weeks    Status New    Target Date 05/23/21      PT LONG TERM GOAL #6   Title Pt will have improved FOTO  score to 66    Baseline 55    Time 4    Period Weeks    Status New    Target Date 05/23/21                    Plan - 04/25/21 0843     Clinical Impression Statement Mr. 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 Pt was last seen at this clinic in April 2022. On assessment, pt demos R > L LE weakness (pt states this feels baseline), decreased muscle endurance (i.e. R LE with decreasing step length with increased time walking), and decreased dynamic gait since last seen by PT. At this time, pt's Berg Balance Score has improved since last being seen by therapy; however, FGA has worsened and indicates high fall risk. Pt's gait speed continues to be slow at 1.8 ft/sec. Pt would benefit from PT to improve his dynamic gait stability for safer community mobility.    Personal Factors and Comorbidities Comorbidity 3+    Comorbidities diabetes, HTN, morbid obesity, history of polysubstance abuse (cocaine and alcohol)    Examination-Activity Limitations Locomotion Level;Transfers;Stairs    Examination-Participation Restrictions Community Activity;Yard Work    Stability/Clinical Decision Making Evolving/Moderate complexity    Rehab Potential Good    PT Frequency  2x / week    PT Duration 8 weeks   including week of recert, 11/22/2020   PT Treatment/Interventions ADLs/Self Care Home Management;Gait training;Stair training;Functional mobility training;Therapeutic activities;Therapeutic exercise;Balance training;Neuromuscular re-education;Patient/family education;DME Instruction    PT Next Visit Plan monitor vitials. Check outdoor amb. Work on dynamic gait and balance with head nods (pt states this is the most unsteady) and decreased visual input. Initiate R LE strengthening HEP for home.    PT Home Exercise Plan Access Code: [email or web address]    Consulted and Agree with Plan of Care Patient             Patient will benefit from skilled therapeutic intervention in order to improve the following deficits and impairments:  Abnormal gait, Difficulty walking, Decreased balance, Decreased mobility  Visit Diagnosis: Other abnormalities of gait and mobility  Muscle weakness (generalized)  Unsteadiness on feet  Hemiplegia and hemiparesis following cerebral infarction affecting right dominant side [facility])     Problem List Patient Active Problem List   Diagnosis Date Noted   CVA (cerebral vascular accident) (HCC) 04/14/2021   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) 03/15/2021   Hypertensive retinopathy of both eyes, grade 2 03/15/2021   Healthcare maintenance 02/23/2021   Dizziness 12/30/2020   Cerebral thrombosis with cerebral infarction 11/20/2020   Right sided weakness 11/19/2020   Hyperkalemia 11/19/2020   AKI (acute kidney injury) (HCC)    Dysarthria    Amnesia 09/08/2020   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   Acute cerebrovascular accident (CVA) (HCC) 06/01/2020   Paronychia of right thumb 05/10/2020   Mediastinal adenopathy 01/06/2020   Secondary hypercoagulable state (HCC) 10/22/2019   Morbid obesity due to excess calories (HCC) 12/08/2016   Paroxysmal atrial fibrillation (HCC) 01/23/2013   Diabetes  mellitus (HCC) 01/23/2013   Hypertension 01/23/2013   Tobacco user 01/23/2013   Chronic diastolic heart failure (HCC) 01/21/2013   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 MRN: [id number] Date of Birth: 20-Jun-1971

## 2021-04-26 NOTE — Progress Notes (Signed)
I agree with the above plan 

## 2021-04-27 ENCOUNTER — Ambulatory Visit (INDEPENDENT_AMBULATORY_CARE_PROVIDER_SITE_OTHER): Payer: 59 | Admitting: Student

## 2021-04-27 ENCOUNTER — Encounter: Payer: Self-pay | Admitting: Student

## 2021-04-27 ENCOUNTER — Other Ambulatory Visit: Payer: Self-pay

## 2021-04-27 ENCOUNTER — Telehealth: Payer: Self-pay | Admitting: Internal Medicine

## 2021-04-27 VITALS — BP 112/77 | HR 84 | Temp 98.5°F | Ht 69.0 in | Wt 307.4 lb

## 2021-04-27 DIAGNOSIS — I10 Essential (primary) hypertension: Secondary | ICD-10-CM

## 2021-04-27 DIAGNOSIS — Z8673 Personal history of transient ischemic attack (TIA), and cerebral infarction without residual deficits: Secondary | ICD-10-CM | POA: Diagnosis not present

## 2021-04-27 DIAGNOSIS — E785 Hyperlipidemia, unspecified: Secondary | ICD-10-CM | POA: Diagnosis not present

## 2021-04-27 DIAGNOSIS — E1165 Type 2 diabetes mellitus with hyperglycemia: Secondary | ICD-10-CM | POA: Diagnosis not present

## 2021-04-27 MED ORDER — ATORVASTATIN CALCIUM 80 MG PO TABS
80.0000 mg | ORAL_TABLET | Freq: Every day | ORAL | 3 refills | Status: DC
Start: 2021-04-27 — End: 2022-05-24

## 2021-04-27 MED ORDER — AMPHETAMINE-DEXTROAMPHET ER 30 MG PO CP24: 30.0000 mg | ORAL_CAPSULE | Freq: Every day | ORAL | 0 refills | Status: DC | PRN

## 2021-04-27 MED ORDER — TRULICITY 1.5 MG/0.5ML ~~LOC~~ SOAJ: 1.5000 mg | SUBCUTANEOUS | 1 refills | Status: DC

## 2021-04-27 NOTE — Progress Notes (Deleted)
New Patient Office Visit  Subjective:  Patient ID: [name]., male    DOB: Mar 15, 1971  Age: 50 y.o. MRN: [id number]  CC:  Chief Complaint  Patient presents with   Follow-up    HFU     HPI [name]. presents for ***  Past Medical History:  Diagnosis Date   Acute pancreatitis 01/22/2019   Diabetes mellitus    GERD (gastroesophageal reflux disease)    History of alcohol abuse    History of cocaine use    History of echocardiogram    a. Echo 4/14: Moderate LVH, vigorous LVEF, EF 65-70%, normal wall motion, grade 2 diastolic dysfunction, mildly dilated aortic root and ascending aorta, ascending aorta 40 mm, aortic root 38 mm, mild LAE   Hx of cardiovascular stress test    a. GXT 5/14: No ischemic changes  //  b. ETT-Myoview 3/16:  Low risk, no ischemia, EF 58%   Hyperlipidemia    Hypertension    Morbid obesity ([place])    Paroxysmal atrial fibrillation ([name])    Occurring in 2008, with several recurrence since then (including in the setting of + cocaine on UDS).   Sleep apnea    uses CPAP   SVT (supraventricular tachycardia) ([place])    mid to long RP SVT 09/2019    Past Surgical History:  Procedure Laterality Date   APPENDECTOMY     ATRIAL FIBRILLATION ABLATION N/A 11/27/2019   Procedure: ATRIAL FIBRILLATION ABLATION;  Surgeon: Thompson Grayer, MD;  Location: [place] CV LAB;  Service: Cardiovascular;  Laterality: N/A;   ROTATOR CUFF REPAIR     SVT ABLATION N/A 11/27/2019   Procedure: SVT ABLATION;  Surgeon: Thompson Grayer, MD;  Location: [place] CV LAB;  Service: Cardiovascular;  Laterality: N/A;   TONSILLECTOMY      Family History  Problem Relation Age of Onset   Diabetes Mother    Heart attack Father 26   Stroke Maternal Grandmother    Stroke Paternal Grandmother    Diabetes Paternal Grandfather     Social History   Socioeconomic History   Marital status: Single    Spouse name: Not on file   Number of children: 0   Years of  education: 12   Highest education level: Not on file  Occupational History   Occupation: Unemployed    Employer: UNEMPLOYED  Tobacco Use   Smoking status: Every Day    Packs/day: 0.10    Years: 28.00    Pack years: 2.80    Types: Cigarettes   Smokeless tobacco: Former    Types: Snuff   Tobacco comments:    1 pack per week   Vaping Use   Vaping Use: Never used  Substance and Sexual Activity   Alcohol use: Not Currently    Alcohol/week: 6.0 standard drinks    Types: 6 Standard drinks or equivalent per week    Comment: monthly   Drug use: Not Currently    Comment: cocaine in the past, none currently   Sexual activity: Not on file  Other Topics Concern   Not on file  Social History Narrative   Fun: Designer, fashion/clothing       Lives in [place] with sister.      Unemployed, was working a Land job   Social Determinants of Radio broadcast assistant Strain: Not on file  Food Insecurity: Not on file  Transportation Needs: Not on file  Physical Activity: Not on file  Stress: Not on  file  Social Connections: Not on file  Intimate Partner Violence: Not on file    ROS Review of Systems  Objective:   Today's Vitals: BP 112/77 (BP Location: Left Arm, Patient Position: Sitting, Cuff Size: Large)   Pulse 84   Temp 98.5 F (36.9 C) (Oral)   Ht $R'5\' 9"'yv$  (1.753 m)   Wt (!) 307 lb 6.4 oz (139.4 kg)   SpO2 99% Comment: room air  BMI 45.40 kg/m   Physical Exam  Assessment & Plan:   Problem List Items Addressed This Visit       Cardiovascular and Mediastinum   Hypertension   Relevant Medications   atorvastatin (LIPITOR) 80 MG tablet   Other Relevant Orders   BMP8+Anion Gap     Endocrine   Diabetes mellitus ([place]) - Primary   Relevant Medications   atorvastatin (LIPITOR) 80 MG tablet   Dulaglutide (TRULICITY) 1.5 UU/8.2CM SOPN   Other Visit Diagnoses     Hyperlipidemia, unspecified hyperlipidemia type       Relevant Medications   atorvastatin (LIPITOR) 80 MG  tablet       Outpatient Encounter Medications as of 04/27/2021  Medication Sig   Dulaglutide (TRULICITY) 1.5 KL/4.9ZP SOPN Inject 1.5 mg into the skin once a week.   Accu-Chek Softclix Lancets lancets TEST UP TO 4 TIMES A DAY   apixaban (ELIQUIS) 5 MG TABS tablet Take 1 tablet (5 mg total) by mouth 2 (two) times daily.   aspirin EC 81 MG EC tablet Take 1 tablet (81 mg total) by mouth daily. Swallow whole.   atorvastatin (LIPITOR) 80 MG tablet Take 1 tablet (80 mg total) by mouth daily.   blood glucose meter kit and supplies KIT Dispense based on patient and insurance preference. Use up to four times daily as directed.   Blood Pressure Monitoring (BLOOD PRESSURE MONITOR/L CUFF) MISC 1 Units by Does not apply route daily.   empagliflozin (JARDIANCE) 10 MG TABS tablet Take 1 tablet (10 mg total) by mouth daily before breakfast.   flecainide (TAMBOCOR) 100 MG tablet Take 1 tablet (100 mg total) by mouth 2 (two) times daily. Please make yearly appt with Dr. Rayann Heman for June 2022 before anymore refills. Thank you 1st attempt   metFORMIN (GLUCOPHAGE) 500 MG tablet Take 2 tablets (1,000 mg total) by mouth 2 (two) times daily with a meal.   metoprolol succinate (TOPROL XL) 100 MG 24 hr tablet Take 1 tablet (100 mg total) by mouth daily. Take with or immediately following a meal.   modafinil (PROVIGIL) 200 MG tablet Take 1 tablet (200 mg total) by mouth daily.   nicotine (NICODERM CQ - DOSED IN MG/24 HOURS) 21 mg/24hr patch PLACE 1 PATCH (21 MG TOTAL) ONTO THE SKIN DAILY.   Olmesartan-amLODIPine-HCTZ 40-10-25 MG TABS Take 1 tablet by mouth daily. Please restart this medication on 04/18/2021   pantoprazole (PROTONIX) 40 MG tablet Take 1 tablet (40 mg total) by mouth daily.   [DISCONTINUED] amphetamine-dextroamphetamine (ADDERALL XR) 30 MG 24 hr capsule Take 1 capsule (30 mg total) by mouth daily as needed (focus). 1 daily as needed   [DISCONTINUED] atorvastatin (LIPITOR) 80 MG tablet Take 1 tablet (80 mg  total) by mouth daily.   [DISCONTINUED] Dulaglutide (TRULICITY) 9.15 AV/6.9VX SOPN Inject 1.5 mg into the skin every Monday.   No facility-administered encounter medications on file as of 04/27/2021.    Follow-up: Return in about 3 months (around 07/28/2021).   [name], MD

## 2021-04-27 NOTE — Progress Notes (Signed)
   CC: hospitalization follow-up  HPI:  [name] is a 50 y.o. with medical history as below presenting to [facility] for hospitalization follow-up  Please see problem-based list for further details, assessments, and plans.  Past Medical History:  Diagnosis Date   Acute pancreatitis 01/22/2019   Diabetes mellitus    GERD (gastroesophageal reflux disease)    History of alcohol abuse    History of cocaine use    History of echocardiogram    a. Echo 4/14: Moderate LVH, vigorous LVEF, EF 65-70%, normal wall motion, grade 2 diastolic dysfunction, mildly dilated aortic root and ascending aorta, ascending aorta 40 mm, aortic root 38 mm, mild LAE   Hx of cardiovascular stress test    a. GXT 5/14: No ischemic changes  //  b. ETT-Myoview 3/16:  Low risk, no ischemia, EF 58%   Hyperlipidemia    Hypertension    Morbid obesity (HCC)    Paroxysmal atrial fibrillation (HCC)    Occurring in 2008, with several recurrence since then (including in the setting of + cocaine on UDS).   Sleep apnea    uses CPAP   SVT (supraventricular tachycardia) (HCC)    mid to long RP SVT 09/2019   Review of Systems:  As per HPI  Physical Exam:  Vitals:   04/27/21 1043  BP: 112/77  Pulse: 84  Temp: 98.5 F (36.9 C)  TempSrc: Oral  SpO2: 99%  Weight: (!) 307 lb 6.4 oz (139.4 kg)  Height: 5\' 9"  (1.753 m)   General: Obese, resting in chair comfortably in no acute distress CV: Regular rate, rhythm. No murmurs, rubs, gallops appreciated. Pulm: Normal work of breathing on room air. Clear to auscultation bilaterally. MSK: Normal bulk, tone. No pitting edema bilaterally Skin: Warm, dry. No rashes or lesions appreciated Neuro: Awake, alert, oriented x4. No focal deficits appreciated Psych: Normal speech, affect, mood  Assessment & Plan:   See Encounters Tab for problem based charting.  Patient discussed with Dr.  

## 2021-04-27 NOTE — Telephone Encounter (Signed)
He had been in hosp last time. I have refilled Adderall.  Please make him a routine f/u with me, and explain we can't keep refilling this controlled drug without seeing him.  Thanks

## 2021-04-27 NOTE — Telephone Encounter (Signed)
Lmtcb for pt.   Pt needs OV with CY.

## 2021-04-27 NOTE — Patient Instructions (Signed)
Mr.Daden Willow Ora., it was a pleasure seeing you today!  Today we discussed: - Medications: Please make sure you are taking your medications as listed on this AVS. Please take Trulicity 1.5mg  weekly. If you have any questions regarding this, please call the clinic.  - Please make sure to follow-up with your cardiologist on 7/25.  I have ordered the following labs today:  Lab Orders  BMP8+Anion Gap     I have ordered the following medication/changed the following medications:   Start the following medications: Meds ordered this encounter  Medications   atorvastatin (LIPITOR) 80 MG tablet    Sig: Take 1 tablet (80 mg total) by mouth daily.    Dispense:  90 tablet    Refill:  3   Dulaglutide (TRULICITY) 1.5 MG/0.5ML SOPN    Sig: Inject 1.5 mg into the skin once a week.    Dispense:  0.5 mL    Refill:  1    Follow-up: 3 months   Please make sure to arrive 15 minutes prior to your next appointment. If you arrive late, you may be asked to reschedule.   We look forward to seeing you next time. Please call our clinic at [phone] if you have any questions or concerns. The best time to call is Monday-Friday from 9am-4pm, but there is someone available 24/7. If after hours or the weekend, call the main hospital number and ask for the Internal Medicine Resident On-Call. If you need medication refills, please notify your pharmacy one week in advance and they will send [place] a request.  Thank you for letting us take part in your care. Wishing you the best!  Thank you, Evlyn Kanner, MD

## 2021-04-27 NOTE — Telephone Encounter (Signed)
Lmtcb for pt.   Adderall $RemoveB'30mg'[id number]$  refill request.  Last filled on 6.13.22 for # 30 with 0 refills.  Last seen on 03/01/21 by CY.  Advised to follow up in 6 weeks.  Pt no showed for 7/6 OV and does not have a pending appt.    Dr. Annamaria Boots please advise. Thanks.   Allergies  Allergen Reactions   Shrimp [Shellfish Allergy] Anaphylaxis   Banana Other (See Comments)    Pt gags   Watermelon Flavor Nausea And Vomiting   Other Itching    Grapes cause itching   Almond Oil Itching    Roof of mouth itches   Peanut-Containing Drug Products Itching and Cough   Sulfa Antibiotics Itching   Current Outpatient Medications on File Prior to Visit  Medication Sig Dispense Refill   Accu-Chek Softclix Lancets lancets TEST UP TO 4 TIMES A DAY 100 each 2   amphetamine-dextroamphetamine (ADDERALL XR) 30 MG 24 hr capsule Take 1 capsule (30 mg total) by mouth daily as needed (focus). 1 daily as needed 30 capsule 0   apixaban (ELIQUIS) 5 MG TABS tablet Take 1 tablet (5 mg total) by mouth 2 (two) times daily. 90 tablet 3   aspirin EC 81 MG EC tablet Take 1 tablet (81 mg total) by mouth daily. Swallow whole. 30 tablet 11   atorvastatin (LIPITOR) 80 MG tablet Take 1 tablet (80 mg total) by mouth daily. 90 tablet 3   blood glucose meter kit and supplies KIT Dispense based on patient and insurance preference. Use up to four times daily as directed. 1 each 0   Blood Pressure Monitoring (BLOOD PRESSURE MONITOR/L CUFF) MISC 1 Units by Does not apply route daily. 1 each 0   Dulaglutide (TRULICITY) 6.01 UX/3.2TF SOPN Inject 0.75 mg into the skin every Monday.     empagliflozin (JARDIANCE) 10 MG TABS tablet Take 1 tablet (10 mg total) by mouth daily before breakfast. 90 tablet 0   flecainide (TAMBOCOR) 100 MG tablet Take 1 tablet (100 mg total) by mouth 2 (two) times daily. Please make yearly appt with Dr. Rayann Heman for June 2022 before anymore refills. Thank you 1st attempt 60 tablet 1   metFORMIN (GLUCOPHAGE) 500 MG tablet Take  2 tablets (1,000 mg total) by mouth 2 (two) times daily with a meal. 120 tablet 1   metoprolol succinate (TOPROL XL) 100 MG 24 hr tablet Take 1 tablet (100 mg total) by mouth daily. Take with or immediately following a meal. 30 tablet 11   modafinil (PROVIGIL) 200 MG tablet Take 1 tablet (200 mg total) by mouth daily. 30 tablet 0   nicotine (NICODERM CQ - DOSED IN MG/24 HOURS) 21 mg/24hr patch PLACE 1 PATCH (21 MG TOTAL) ONTO THE SKIN DAILY. 28 patch 1   Olmesartan-amLODIPine-HCTZ 40-10-25 MG TABS Take 1 tablet by mouth daily. Please restart this medication on 04/18/2021 90 tablet 3   pantoprazole (PROTONIX) 40 MG tablet Take 1 tablet (40 mg total) by mouth daily. 30 tablet 5   No current facility-administered medications on file prior to visit.

## 2021-04-28 LAB — BMP8+ANION GAP
Anion Gap: 16 mmol/L (ref 10.0–18.0)
BUN/Creatinine Ratio: 17 (ref 9–20)
BUN: 25 mg/dL — ABNORMAL HIGH (ref 6–24)
CO2: 21 mmol/L (ref 20–29)
Calcium: 9.7 mg/dL (ref 8.7–10.2)
Chloride: 100 mmol/L (ref 96–106)
Creatinine, Ser: 1.49 mg/dL — ABNORMAL HIGH (ref 0.76–1.27)
Glucose: 148 mg/dL — ABNORMAL HIGH (ref 65–99)
Potassium: 5.1 mmol/L (ref 3.5–5.2)
Sodium: 137 mmol/L (ref 134–144)
eGFR: 57 mL/min/{1.73_m2} — ABNORMAL LOW (ref >59)

## 2021-04-28 NOTE — Telephone Encounter (Signed)
Pt has scheduled split night on 7/19.   Dr. Maple Hudson please advise if you would like pt to have OV after split night to review results or wait till results come back to advise when f/u is needed. Thanks.

## 2021-04-28 NOTE — Telephone Encounter (Signed)
Please remind him of sleep study and verify he will go and is able to go for that. Set return ov with me for 2 weeks or more after sleep study date, so I will have that report. Thanks

## 2021-04-29 NOTE — Assessment & Plan Note (Signed)
[name] reports that at discharge he was told to continue Trulicity, although at a decreased dose than what he was previously taking. He mentions that he was taking 1.5mg  weekly prior to hospitalization and was tolerating well. A1c during hospitalization 8.2%.   - Re-start Trulicity 1.5mg  weekly - Metformin 1000mg  twice daily - Empagliflozin 10mg  daily

## 2021-04-29 NOTE — Assessment & Plan Note (Addendum)
[name] is presenting today for a follow-up for a recent hospitalization for acute cerebral infarcts in pons, left temporal lobe, and left frontal lobe. Initially concerning for embolic etiology, however work-up complicated by poor cardiac visualization on Echo 2/2 body habitus. He was subsequently discharged with plans for TEE. During hospitalization, hypercoagulable work-up initiated given [name] has experienced multiple strokes within recent years. Upon review, work-up largely negative, only with a mildly increased homocysteine.   Since discharge, [name] reports that he has been doing well. He mentions that he originally presented to ED due to sudden onset of difficulty swallowing, but this has improved since being home. Denies any new neurologic symptoms or focal deficits. We discussed risk factors to prevent further strokes. His current modifiable risk factors include diabetes, hypertension, tobacco use, hypercholesterolemia, atrial fibrillation, obesity, sleep apnea.  - Follow-up with cardiology for TEE - Follow-up with neurology - Continue eliquis, aspirin, atorvastatin - BMP today

## 2021-04-29 NOTE — Assessment & Plan Note (Signed)
BP Readings from Last 3 Encounters:  04/27/21 112/77  04/19/21 (!) 170/90  04/16/21 117/67   Blood pressure well-controlled today. [name] denies chest pain, dyspnea, dizziness, lower extremity edema. He does question whether he should be taking spironolactone. I discussed with him that this was stopped months ago and he should continue to refrain from this due to history of hyperkalemia.  - Olmesartan-amlodipine-hydrochlorothiazide 40-10-25mg  daily - Metoprolol succinate 100mg  daily - Empagliflozin 10mg  daily - BMP today - Continue monitoring to make sure he is taking the correct medications

## 2021-05-02 ENCOUNTER — Ambulatory Visit: Payer: 59 | Admitting: Physical Therapy

## 2021-05-03 ENCOUNTER — Ambulatory Visit (HOSPITAL_BASED_OUTPATIENT_CLINIC_OR_DEPARTMENT_OTHER): Payer: 59 | Attending: Internal Medicine | Admitting: Internal Medicine

## 2021-05-03 ENCOUNTER — Other Ambulatory Visit: Payer: Self-pay

## 2021-05-03 VITALS — Ht 69.0 in | Wt 306.0 lb

## 2021-05-03 DIAGNOSIS — G4733 Obstructive sleep apnea (adult) (pediatric): Secondary | ICD-10-CM | POA: Diagnosis present

## 2021-05-03 DIAGNOSIS — G47411 Narcolepsy with cataplexy: Secondary | ICD-10-CM

## 2021-05-03 DIAGNOSIS — I639 Cerebral infarction, unspecified: Secondary | ICD-10-CM

## 2021-05-03 NOTE — Telephone Encounter (Signed)
Called and spoke to pt. Pt aware of tonight's sleep study. Appt scheduled with CY on 8/8 to review study. Pt verbalized understanding and denied any further questions or concerns at this time.

## 2021-05-04 ENCOUNTER — Encounter: Payer: Self-pay | Admitting: *Deleted

## 2021-05-05 ENCOUNTER — Ambulatory Visit (HOSPITAL_COMMUNITY)
Admission: RE | Admit: 2021-05-05 | Discharge: 2021-05-05 | Disposition: A | Discharge status: Home / Self Care | Payer: 59 | Source: Ambulatory Visit | Attending: Adult Health | Admitting: Adult Health

## 2021-05-05 ENCOUNTER — Other Ambulatory Visit: Payer: Self-pay

## 2021-05-05 DIAGNOSIS — R1310 Dysphagia, unspecified: Secondary | ICD-10-CM | POA: Insufficient documentation

## 2021-05-05 NOTE — Progress Notes (Signed)
Modified Barium Swallow Progress Note  Patient Details  Name: [name]. MRN: [id number] Date of Birth: 1971/06/13  Today's Date: 05/05/2021  Modified Barium Swallow completed.  Full report located under Chart Review in the Imaging Section.  Brief recommendations include the following:  Clinical Impression  Pt presents with normal oropharyngeal swallow marked by appropriate oral manipulation/bolus cohesion, timely swallow initation, and strong laryngeal vestibule closure, resulting in no noted penetration or aspiration. No significant pharyngeal residuals observed secondary to strength of base of tongue retraction, full epiglottic inversion and pharyngeal constriction. POs trialed in A-P view to provide additional insight given pt's recurrent complaints of dysphagia, however no new information obtained. Pt experienced some difficulty with A-P transit of 41mm barium pill with thin, requiring administration whole with puree. Esophageal sweep revealed easy passage of pill from cervical esophagus. Discussed results with pt and educated regarding basic swallow precautions to decrease risk for aspiration of thin liquids. He verbalized that taking small sips often decreases coughing episodes. Recommend continue regular/thin liquid diet at this time.   Swallow Evaluation Recommendations       SLP Diet Recommendations: Regular solids;Thin liquid   Liquid Administration via: Cup;Straw   Medication Administration: Whole meds with liquid   Supervision: Patient able to self feed   Compensations: Minimize environmental distractions;Slow rate;Small sips/bites   Postural Changes: Seated upright at 90 degrees   Oral Care Recommendations: Oral care BID       Avie Echevaria, MA, CCC-SLP Acute Rehabilitation Services Office Number: [phone]  Paulette Blanch 05/05/2021,12:58 PM

## 2021-05-06 ENCOUNTER — Encounter: Payer: Self-pay | Admitting: Gastroenterology

## 2021-05-07 DIAGNOSIS — I639 Cerebral infarction, unspecified: Secondary | ICD-10-CM

## 2021-05-07 NOTE — Procedures (Signed)
Patient Name: [name], [name] Date: 05/03/2021 Gender: Male D.O.B: 1971/05/01 Age (years): 23 Referring Provider: Baird Lyons MD, ABSM Height (inches): 69 Interpreting Physician: Baird Lyons MD, ABSM Weight (lbs): 306 RPSGT: Carolin Coy BMI: 40 MRN: [id number] Neck Size: 18.50  CLINICAL INFORMATION Sleep Study Type: Split Night CPAP Indication for sleep study: Diabetes, Excessive Daytime Sleepiness, Fatigue, Hypertension, Obesity, OSA, Snoring Epworth Sleepiness Score: 20  SLEEP STUDY TECHNIQUE As per the AASM Manual for the Scoring of Sleep and Associated Events v2.3 (April 2016) with a hypopnea requiring 4% desaturations.  The channels recorded and monitored were frontal, central and occipital EEG, electrooculogram (EOG), submentalis EMG (chin), nasal and oral airflow, thoracic and abdominal wall motion, anterior tibialis EMG, snore microphone, electrocardiogram, and pulse oximetry. Continuous positive airway pressure (CPAP) was initiated when the patient met split night criteria and was titrated according to treat sleep-disordered breathing.  MEDICATIONS Medications self-administered by patient taken the night of the study : N/A  RESPIRATORY PARAMETERS Diagnostic  Total AHI (/hr): 29.6 RDI (/hr): 46.4 OA Index (/hr): 2.7 CA Index (/hr): 0.4 REM AHI (/hr): 71.6 NREM AHI (/hr): 18.4 Supine AHI (/hr): 25.4 Non-supine AHI (/hr): 32.4 Min O2 Sat (%): 81.0 Mean O2 (%): 92.4 Time below 88% (min): 6.7   Titration  Optimal Pressure (cm): 16 AHI at Optimal Pressure (/hr): 0 Min O2 at Optimal Pressure (%): 94.0 Supine % at Optimal (%): 0 Sleep % at Optimal (%): 100   SLEEP ARCHITECTURE The recording time for the entire night was 401 minutes.  During a baseline period of 197.7 minutes, the patient slept for 154.0 minutes in REM and nonREM, yielding a sleep efficiency of 77.9%%. Sleep onset after lights out was 7.0 minutes with a REM latency of 6.0 minutes. The  patient spent 28.9%% of the night in stage N1 sleep, 49.4%% in stage N2 sleep, 0.0%% in stage N3 and 21.8% in REM.   During the titration period of 201.5 minutes, the patient slept for 137.5 minutes in REM and nonREM, yielding a sleep efficiency of 68.2%%. Sleep onset after CPAP initiation was 4.5 minutes with a REM latency of 113.5 minutes. The patient spent 28.0%% of the night in stage N1 sleep, 43.3%% in stage N2 sleep, 0.0%% in stage N3 and 28.7% in REM.  CARDIAC DATA The 2 lead EKG demonstrated sinus rhythm. The mean heart rate was 100.0 beats per minute. Other EKG findings include: PVCs.  LEG MOVEMENT DATA The total Periodic Limb Movements of Sleep (PLMS) were 0. The PLMS index was 0.0 .  IMPRESSIONS - Moderate to severe obstructive sleep apnea occurred during the diagnostic portion of the study(AHI = 29.6/hour). An optimal PAP pressure was selected for this patient ( 16 cm of water) - No significant central sleep apnea occurred during the diagnostic portion of the study (CAI = 0.4/hour). - Mild oxygen desaturation was noted during the diagnostic portion of the study (Min O2 = 81.0%). Minimum O2 saturation on CPAP 16 was 94%. - The patient snored with moderate snoring volume during the diagnostic portion of the study. - EKG findings include PVCs. - Clinically significant periodic limb movements did not occur during sleep.  DIAGNOSIS - Obstructive Sleep Apnea (G47.33)  RECOMMENDATIONS - Trial of CPAP therapy on 16 cm H2O or autopap 10-20. - Patient used a Large size Fisher&Paykel Full Face Mask Simplus mask and heated humidification. - Be careful with alcohol, sedatives and other CNS depressants that may worsen sleep apnea and disrupt normal sleep architecture. -  Sleep hygiene should be reviewed to assess factors that may improve sleep quality. - Weight management and regular exercise should be initiated or continued.  [Electronically signed] 05/07/2021 01:00 PM  Baird Lyons MD,  [place], American Board of Sleep Medicine   NPI: [id number]                        [place], [place] [facility]  ELECTRONICALLY SIGNED ON:  05/07/2021, 1:01 PM [place] PH: [phone]   FX: [phone] [place]

## 2021-05-09 ENCOUNTER — Ambulatory Visit: Payer: 59 | Admitting: Student

## 2021-05-09 NOTE — Progress Notes (Signed)
Internal Medicine Clinic Attending ? ?Case discussed with Dr. Braswell  At the time of the visit.  We reviewed the resident?s history and exam and pertinent patient test results.  I agree with the assessment, diagnosis, and plan of care documented in the resident?s note.  ?

## 2021-05-09 NOTE — H&P (View-Only) (Signed)
PCP:  [name], MD Primary Cardiologist: Larae Grooms, MD Electrophysiologist: Thompson Grayer, MD   [place]. is a 50 y.o. male seen today for Thompson Grayer, MD for routine electrophysiology followup.  Since last being seen in our clinic the patient reports doing well overall.  He was admitted to hospital earlier this month with concerns for stroke. UDS positive for cocaine as well at that time. Echo 04/15/2021 LVEF 60-65% with "severe asymmetric LVH". Neurology notes recommend TEE to r/o PFO.  He denies chest pain, palpitations, dyspnea, PND, orthopnea, nausea, vomiting, dizziness, syncope, edema, weight gain, or early satiety.  Past Medical History:  Diagnosis Date   Acute pancreatitis 01/22/2019   Diabetes mellitus    GERD (gastroesophageal reflux disease)    History of alcohol abuse    History of cocaine use    History of echocardiogram    a. Echo 4/14: Moderate LVH, vigorous LVEF, EF 65-70%, normal wall motion, grade 2 diastolic dysfunction, mildly dilated aortic root and ascending aorta, ascending aorta 40 mm, aortic root 38 mm, mild LAE   Hx of cardiovascular stress test    a. GXT 5/14: No ischemic changes  //  b. ETT-Myoview 3/16:  Low risk, no ischemia, EF 58%   Hyperlipidemia    Hypertension    Morbid obesity ([place])    Paroxysmal atrial fibrillation ([place])    Occurring in 2008, with several recurrence since then (including in the setting of + cocaine on UDS).   Sleep apnea    uses CPAP   SVT (supraventricular tachycardia) ([place])    mid to long RP SVT 09/2019   Past Surgical History:  Procedure Laterality Date   APPENDECTOMY     ATRIAL FIBRILLATION ABLATION N/A 11/27/2019   Procedure: ATRIAL FIBRILLATION ABLATION;  Surgeon: Thompson Grayer, MD;  Location: [facility];  Service: Cardiovascular;  Laterality: N/A;   ROTATOR CUFF REPAIR     SVT ABLATION N/A 11/27/2019   Procedure: SVT ABLATION;  Surgeon: Thompson Grayer, MD;  Location: [facility];  Service: Cardiovascular;  Laterality: N/A;   TONSILLECTOMY      Current Outpatient Medications  Medication Sig Dispense Refill   Accu-Chek Softclix Lancets lancets TEST UP TO 4 TIMES A DAY 100 each 2   amphetamine-dextroamphetamine (ADDERALL XR) 30 MG 24 hr capsule Take 1 capsule (30 mg total) by mouth daily as needed (focus). 1 daily as needed 30 capsule 0   apixaban (ELIQUIS) 5 MG TABS tablet Take 1 tablet (5 mg total) by mouth 2 (two) times daily. 90 tablet 3   aspirin EC 81 MG EC tablet Take 1 tablet (81 mg total) by mouth daily. Swallow whole. 30 tablet 11   atorvastatin (LIPITOR) 80 MG tablet Take 1 tablet (80 mg total) by mouth daily. 90 tablet 3   blood glucose meter kit and supplies KIT Dispense based on patient and insurance preference. Use up to four times daily as directed. 1 each 0   Blood Pressure Monitoring (BLOOD PRESSURE MONITOR/L CUFF) MISC 1 Units by Does not apply route daily. 1 each 0   Dulaglutide (TRULICITY) 1.5 LT/9.0ZE SOPN Inject 1.5 mg into the skin once a week. 0.5 mL 1   empagliflozin (JARDIANCE) 10 MG TABS tablet Take 1 tablet (10 mg total) by mouth daily before breakfast. 90 tablet 0   flecainide (TAMBOCOR) 100 MG tablet Take 1 tablet (100 mg total) by mouth 2 (two) times daily. Please make yearly appt with Dr. Rayann Heman for June  2022 before anymore refills. Thank you 1st attempt 60 tablet 1   metFORMIN (GLUCOPHAGE) 500 MG tablet Take 2 tablets (1,000 mg total) by mouth 2 (two) times daily with a meal. 120 tablet 1   metoprolol succinate (TOPROL XL) 100 MG 24 hr tablet Take 1 tablet (100 mg total) by mouth daily. Take with or immediately following a meal. 30 tablet 11   modafinil (PROVIGIL) 200 MG tablet Take 1 tablet (200 mg total) by mouth daily. 30 tablet 0   nicotine (NICODERM CQ - DOSED IN MG/24 HOURS) 21 mg/24hr patch PLACE 1 PATCH (21 MG TOTAL) ONTO THE SKIN DAILY. 28 patch 1   Olmesartan-amLODIPine-HCTZ 40-10-25 MG TABS Take 1 tablet by mouth daily.  Please restart this medication on 04/18/2021 90 tablet 3   pantoprazole (PROTONIX) 40 MG tablet Take 1 tablet (40 mg total) by mouth daily. 30 tablet 5   No current facility-administered medications for this visit.    Allergies  Allergen Reactions   Shrimp [Shellfish Allergy] Anaphylaxis   Banana Other (See Comments)    Pt gags   Watermelon Flavor Nausea And Vomiting   Other Itching    Grapes cause itching   Almond Oil Itching    Roof of mouth itches   Peanut-Containing Drug Products Itching and Cough   Sulfa Antibiotics Itching    Social History   Socioeconomic History   Marital status: Single    Spouse name: Not on file   Number of children: 0   Years of education: 12   Highest education level: Not on file  Occupational History   Occupation: Unemployed    Employer: UNEMPLOYED  Tobacco Use   Smoking status: Every Day    Packs/day: 0.10    Years: 28.00    Pack years: 2.80    Types: Cigarettes   Smokeless tobacco: Former    Types: Snuff   Tobacco comments:    1 pack per week   Vaping Use   Vaping Use: Never used  Substance and Sexual Activity   Alcohol use: Not Currently    Alcohol/week: 6.0 standard drinks    Types: 6 Standard drinks or equivalent per week    Comment: monthly   Drug use: Not Currently    Comment: cocaine in the past, none currently   Sexual activity: Not on file  Other Topics Concern   Not on file  Social History Narrative   Fun: Designer, fashion/clothing       Lives in [place] with sister.      Unemployed, was working a Land job   Social Determinants of Sales executive: Not on file  Food Insecurity: Not on file  Transportation Needs: Not on file  Physical Activity: Not on file  Stress: Not on file  Social Connections: Not on file  Intimate Partner Violence: Not on file   Review of Systems: All other systems reviewed and are otherwise negative except as noted above.  Physical Exam: Vitals:   05/10/21 0908  BP:  126/80  Pulse: 75  SpO2: 97%  Weight: (!) 308 lb (139.7 kg)  Height: $Remove'5\' 9"'iGAjszV$  (1.753 m)    GEN- The patient is well appearing, alert and oriented x 3 today.   HEENT: normocephalic, atraumatic; sclera clear, conjunctiva pink; hearing intact; oropharynx clear; neck supple, no JVP Lymph- no cervical lymphadenopathy Lungs- Clear to ausculation bilaterally, normal work of breathing.  No wheezes, rales, rhonchi Heart- Regular rate and rhythm, no murmurs, rubs or gallops, PMI not laterally  displaced GI- soft, non-tender, non-distended, bowel sounds present, no hepatosplenomegaly Extremities- no clubbing, cyanosis, or edema; DP/PT/radial pulses 2+ bilaterally MS- no significant deformity or atrophy Skin- warm and dry, no rash or lesion Psych- euthymic mood, full affect Neuro- strength and sensation are intact  EKG is ordered. Personal review of EKG from today shows NSR at 75 bpm with QRS 100 ms and PR interval 194 ms, stable compared to EKG 11/19/20  Additional studies reviewed include: Previous EP office visits  Assessment and Plan:  1.  Paroxymal atrial fibrillation/ atach Well controlled post ablation.  Continue flecainide 100 mg BID.  Continue toprol 50 mg daily.  Continue Eliquis for chads2vasc score of 4. Denies bleeding.  Labs today.  Encouraged lifestyle modification.   2. Hypertensive cardiovascular disease Continue to follow on current medications.  Echo 04/15/2021 LVEF 60-65%   3. OSA Encouraged nightly CPAP use   4. Morbid obesity Body mass index is 45.48 kg/m.  We discussed lifestyle modification today  5. Cryptogenic Stroke Now on Eliquis  No clear indication for Loop at this time with h/o AF, though s/p ablation in the past.  Neurology has recommended TEE in their notes. WIll arrange.  The risks and benefits of transesophageal echocardiogram have been explained including risks of esophageal damage, perforation (1:10,000 risk), bleeding, pharyngeal hematoma as well as  other potential complications associated with conscious sedation including aspiration, arrhythmia, respiratory failure and death. Alternatives to treatment were discussed, questions were answered. Patient is willing to proceed.   Shirley Friar, PA-C  05/10/21 9:25 AM

## 2021-05-09 NOTE — Progress Notes (Signed)
PCP:  Douglas Kanner, MD Primary Cardiologist: Douglas Muss, MD Electrophysiologist: Douglas Range, MD   [name]. is a 50 y.o. male seen today for Douglas Range, MD for routine electrophysiology followup.  Since last being seen in our clinic the patient reports doing well overall.  He was admitted to hospital earlier this month with concerns for stroke. UDS positive for cocaine as well at that time. Echo 04/15/2021 LVEF 60-65% with "severe asymmetric LVH". Neurology notes recommend TEE to r/o PFO.  He denies chest pain, palpitations, dyspnea, PND, orthopnea, nausea, vomiting, dizziness, syncope, edema, weight gain, or early satiety.  Past Medical History:  Diagnosis Date   Acute pancreatitis 01/22/2019   Diabetes mellitus    GERD (gastroesophageal reflux disease)    History of alcohol abuse    History of cocaine use    History of echocardiogram    a. Echo 4/14: Moderate LVH, vigorous LVEF, EF 65-70%, normal wall motion, grade 2 diastolic dysfunction, mildly dilated aortic root and ascending aorta, ascending aorta 40 mm, aortic root 38 mm, mild LAE   Hx of cardiovascular stress test    a. GXT 5/14: No ischemic changes  //  b. ETT-Myoview 3/16:  Low risk, no ischemia, EF 58%   Hyperlipidemia    Hypertension    Morbid obesity (HCC)    Paroxysmal atrial fibrillation (HCC)    Occurring in 2008, with several recurrence since then (including in the setting of + cocaine on UDS).   Sleep apnea    uses CPAP   SVT (supraventricular tachycardia) (HCC)    mid to long RP SVT 09/2019   Past Surgical History:  Procedure Laterality Date   APPENDECTOMY     ATRIAL FIBRILLATION ABLATION N/A 11/27/2019   Procedure: ATRIAL FIBRILLATION ABLATION;  Surgeon: Douglas Range, MD;  [facility]: [facility];  Service: Cardiovascular;  Laterality: N/A;   ROTATOR CUFF REPAIR     SVT ABLATION N/A 11/27/2019   Procedure: SVT ABLATION;  Surgeon: Douglas Range, MD;  Location: [facility];  Service: Cardiovascular;  Laterality: N/A;   TONSILLECTOMY      Current Outpatient Medications  Medication Sig Dispense Refill   Accu-Chek Softclix Lancets lancets TEST UP TO 4 TIMES A DAY 100 each 2   amphetamine-dextroamphetamine (ADDERALL XR) 30 MG 24 hr capsule Take 1 capsule (30 mg total) by mouth daily as needed (focus). 1 daily as needed 30 capsule 0   apixaban (ELIQUIS) 5 MG TABS tablet Take 1 tablet (5 mg total) by mouth 2 (two) times daily. 90 tablet 3   aspirin EC 81 MG EC tablet Take 1 tablet (81 mg total) by mouth daily. Swallow whole. 30 tablet 11   atorvastatin (LIPITOR) 80 MG tablet Take 1 tablet (80 mg total) by mouth daily. 90 tablet 3   blood glucose meter kit and supplies KIT Dispense based on patient and insurance preference. Use up to four times daily as directed. 1 each 0   Blood Pressure Monitoring (BLOOD PRESSURE MONITOR/L CUFF) MISC 1 Units by Does not apply route daily. 1 each 0   Dulaglutide (TRULICITY) 1.5 MG/0.5ML SOPN Inject 1.5 mg into the skin once a week. 0.5 mL 1   empagliflozin (JARDIANCE) 10 MG TABS tablet Take 1 tablet (10 mg total) by mouth daily before breakfast. 90 tablet 0   flecainide (TAMBOCOR) 100 MG tablet Take 1 tablet (100 mg total) by mouth 2 (two) times daily. Please make yearly appt with Dr. Johney Frame for June  2022 before anymore refills. Thank you 1st attempt 60 tablet 1   metFORMIN (GLUCOPHAGE) 500 MG tablet Take 2 tablets (1,000 mg total) by mouth 2 (two) times daily with a meal. 120 tablet 1   metoprolol succinate (TOPROL XL) 100 MG 24 hr tablet Take 1 tablet (100 mg total) by mouth daily. Take with or immediately following a meal. 30 tablet 11   modafinil (PROVIGIL) 200 MG tablet Take 1 tablet (200 mg total) by mouth daily. 30 tablet 0   nicotine (NICODERM CQ - DOSED IN MG/24 HOURS) 21 mg/24hr patch PLACE 1 PATCH (21 MG TOTAL) ONTO THE SKIN DAILY. 28 patch 1   Olmesartan-amLODIPine-HCTZ 40-10-25 MG TABS Take 1 tablet by mouth daily.  Please restart this medication on 04/18/2021 90 tablet 3   pantoprazole (PROTONIX) 40 MG tablet Take 1 tablet (40 mg total) by mouth daily. 30 tablet 5   No current facility-administered medications for this visit.    Allergies  Allergen Reactions   Shrimp [Shellfish Allergy] Anaphylaxis   Banana Other (See Comments)    Pt gags   Watermelon Flavor Nausea And Vomiting   Other Itching    Grapes cause itching   Almond Oil Itching    Roof of mouth itches   Peanut-Containing Drug Products Itching and Cough   Sulfa Antibiotics Itching    Social History   Socioeconomic History   Marital status: Single    Spouse name: Not on file   Number of children: 0   Years of education: 12   Highest education level: Not on file  Occupational History   Occupation: Unemployed    Employer: UNEMPLOYED  Tobacco Use   Smoking status: Every Day    Packs/day: 0.10    Years: 28.00    Pack years: 2.80    Types: Cigarettes   Smokeless tobacco: Former    Types: Snuff   Tobacco comments:    1 pack per week   Vaping Use   Vaping Use: Never used  Substance and Sexual Activity   Alcohol use: Not Currently    Alcohol/week: 6.0 standard drinks    Types: 6 Standard drinks or equivalent per week    Comment: monthly   Drug use: Not Currently    Comment: cocaine in the past, none currently   Sexual activity: Not on file  Other Topics Concern   Not on file  Social History Narrative   Fun: Designer, fashion/clothing       Lives in [place] with sister.      Unemployed, was working a Land job   Social Determinants of Sales executive: Not on file  Food Insecurity: Not on file  Transportation Needs: Not on file  Physical Activity: Not on file  Stress: Not on file  Social Connections: Not on file  Intimate Partner Violence: Not on file   Review of Systems: All other systems reviewed and are otherwise negative except as noted above.  Physical Exam: Vitals:   05/10/21 0908  BP:  126/80  Pulse: 75  SpO2: 97%  Weight: (!) 308 lb (139.7 kg)  Height: $Remove'5\' 9"'HUEbEym$  (1.753 m)    GEN- The patient is well appearing, alert and oriented x 3 today.   HEENT: normocephalic, atraumatic; sclera clear, conjunctiva pink; hearing intact; oropharynx clear; neck supple, no JVP Lymph- no cervical lymphadenopathy Lungs- Clear to ausculation bilaterally, normal work of breathing.  No wheezes, rales, rhonchi Heart- Regular rate and rhythm, no murmurs, rubs or gallops, PMI not laterally  displaced GI- soft, non-tender, non-distended, bowel sounds present, no hepatosplenomegaly Extremities- no clubbing, cyanosis, or edema; DP/PT/radial pulses 2+ bilaterally MS- no significant deformity or atrophy Skin- warm and dry, no rash or lesion Psych- euthymic mood, full affect Neuro- strength and sensation are intact  EKG is ordered. Personal review of EKG from today shows NSR at 75 bpm with QRS 100 ms and PR interval 194 ms, stable compared to EKG 11/19/20  Additional studies reviewed include: Previous EP office visits  Assessment and Plan:  1.  Paroxymal atrial fibrillation/ atach Well controlled post ablation.  Continue flecainide 100 mg BID.  Continue toprol 50 mg daily.  Continue Eliquis for chads2vasc score of 4. Denies bleeding.  Labs today.  Encouraged lifestyle modification.   2. Hypertensive cardiovascular disease Continue to follow on current medications.  Echo 04/15/2021 LVEF 60-65%   3. OSA Encouraged nightly CPAP use   4. Morbid obesity Body mass index is 45.48 kg/m.  We discussed lifestyle modification today  5. Cryptogenic Stroke Now on Eliquis  No clear indication for Loop at this time with h/o AF, though s/p ablation in the past.  Neurology has recommended TEE in their notes. WIll arrange.  The risks and benefits of transesophageal echocardiogram have been explained including risks of esophageal damage, perforation (1:10,000 risk), bleeding, pharyngeal hematoma as well as  other potential complications associated with conscious sedation including aspiration, arrhythmia, respiratory failure and death. Alternatives to treatment were discussed, questions were answered. Patient is willing to proceed.   Shirley Friar, PA-C  05/10/21 9:25 AM

## 2021-05-10 ENCOUNTER — Ambulatory Visit (INDEPENDENT_AMBULATORY_CARE_PROVIDER_SITE_OTHER): Payer: 59 | Admitting: Student

## 2021-05-10 ENCOUNTER — Encounter: Payer: Self-pay | Admitting: Student

## 2021-05-10 ENCOUNTER — Other Ambulatory Visit: Payer: Self-pay

## 2021-05-10 VITALS — BP 126/80 | HR 75 | Ht 69.0 in | Wt 308.0 lb

## 2021-05-10 DIAGNOSIS — I10 Essential (primary) hypertension: Secondary | ICD-10-CM

## 2021-05-10 DIAGNOSIS — I480 Paroxysmal atrial fibrillation: Secondary | ICD-10-CM

## 2021-05-10 DIAGNOSIS — G4733 Obstructive sleep apnea (adult) (pediatric): Secondary | ICD-10-CM | POA: Diagnosis not present

## 2021-05-10 NOTE — Patient Instructions (Addendum)
Medication Instructions:  Your physician recommends that you continue on your current medications as directed. Please refer to the Current Medication list given to you today.  *If you need a refill on your cardiac medications before your next appointment, please call your pharmacy*   Lab Work: None If you have labs (blood work) drawn today and your tests are completely normal, you will receive your results only by: MyChart Message (if you have MyChart) OR A paper copy in the mail If you have any lab test that is abnormal or we need to change your treatment, we will call you to review the results.   Follow-Up: At [facility] you and your health needs are our priority.  As part of our continuing mission to provide you with exceptional heart care, we have created designated Provider Care Teams.  These Care Teams include your primary Cardiologist (physician) and Advanced Practice Providers (APPs -  Physician Assistants and Nurse Practitioners) who all work together to provide you with the care you need, when you need it.  We recommend signing up for the patient portal called "MyChart".  Sign up information is provided on this After Visit Summary.  MyChart is used to connect with patients for Virtual Visits (Telemedicine).  Patients are able to view lab/test results, encounter notes, upcoming appointments, etc.  Non-urgent messages can be sent to your provider as well.   To learn more about what you can do with MyChart, go to [email or web address].    Your next appointment:   6 month(s)  The format for your next appointment:   In Person  Provider:   Casimiro Needle "Otilio Saber, PA-C  Other Instructions:  You are scheduled for a TEE on 05/20/2021 with Dr. Bjorn Pippin.  Please arrive at the [facility] (Main Entrance A) at [facility]: [address] [place], [place] [address] at 9:00 am.   DIET: Nothing to eat or drink after midnight except a sip of water with medications (see  medication instructions below)  FYI: For your safety, and to allow [place] to monitor your vital signs accurately during the surgery/procedure we request that   if you have artificial nails, gel coating, SNS etc. Please have those removed prior to your surgery/procedure. Not having the nail coverings /polish removed may result in cancellation or delay of your surgery/procedure.   Medication Instructions:  Continue your anticoagulant: Eliquis You will need to continue your anticoagulant after your procedure until you  are told by your  Provider that it is safe to stop   Labs:  Your lab work will be done at the hospital prior to your procedure - you will need to arrive 1  hours ahead of your procedure  You must have a responsible person to drive you home and stay in the waiting area during your procedure. Failure to do so could result in cancellation.  Bring your insurance cards.  *Special Note: Every effort is made to have your procedure done on time. Occasionally there are emergencies that occur at the hospital that may cause delays. Please be patient if a delay does occur.

## 2021-05-11 ENCOUNTER — Ambulatory Visit: Payer: 59 | Admitting: Physical Therapy

## 2021-05-15 ENCOUNTER — Other Ambulatory Visit: Payer: Self-pay | Admitting: Internal Medicine

## 2021-05-17 ENCOUNTER — Other Ambulatory Visit: Payer: Self-pay

## 2021-05-17 ENCOUNTER — Telehealth: Payer: Self-pay | Admitting: Cardiology

## 2021-05-17 NOTE — Telephone Encounter (Signed)
Spoke to patient advised to arrive at [facility] hospital 8/5 at 9:00 am for TEE.Advised to follow directions already given.

## 2021-05-17 NOTE — Telephone Encounter (Signed)
New message:    Patient calling to get information concering his [facility] for Friday. Please advise/

## 2021-05-18 ENCOUNTER — Ambulatory Visit: Payer: 59 | Attending: Internal Medicine | Admitting: Physical Therapy

## 2021-05-18 ENCOUNTER — Encounter: Payer: Self-pay | Admitting: Physical Therapy

## 2021-05-18 ENCOUNTER — Other Ambulatory Visit: Payer: Self-pay

## 2021-05-18 VITALS — BP 132/87

## 2021-05-18 DIAGNOSIS — R2689 Other abnormalities of gait and mobility: Secondary | ICD-10-CM | POA: Diagnosis present

## 2021-05-18 DIAGNOSIS — R2681 Unsteadiness on feet: Secondary | ICD-10-CM | POA: Insufficient documentation

## 2021-05-18 DIAGNOSIS — M6281 Muscle weakness (generalized): Secondary | ICD-10-CM | POA: Insufficient documentation

## 2021-05-18 NOTE — Patient Instructions (Signed)
Access Code: [email or web address] URL: [email or web address] Date: 05/18/2021 Prepared by: Sallyanne Kuster  Exercises Sit to/from Stand in Stride position - 1 x daily - 5 x weekly - 1 sets - 10 reps Seated Knee Extension with Resistance - 1 x daily - 5 x weekly - 1 sets - 10 reps Heel Toe Raises with Counter Support - 1 x daily - 5 x weekly - 3 sets - 10 reps Walking March - 2 x daily - 5 x weekly - 1 sets - 3-4 reps Wide Stance with Eyes Closed on Foam Pad - 1 x daily - 5 x weekly - 1 sets - 3-4 reps - 20 hold

## 2021-05-18 NOTE — Therapy (Addendum)
[facility] [address] [place], [place], [address] Phone: [phone]   Fax:  [phone]  Physical Therapy Treatment and Re-Cert  Patient Details  Name: [name]. MRN: [id number] Date of Birth: 06-12-1971 Referring Provider (PT): Reymundo Poll   Encounter Date: 05/18/2021   PT End of Session - 05/18/21 0809     Visit Number 2    Number of Visits 5    Date for PT Re-Evaluation 05/23/21    Authorization Type UHC-60 visit limit PT/OT/speech (each visit of each type counts); 40 TOTAL visits of all therapies in 2022    PT Start Time 0808   pt running late for session   PT Stop Time 0847    PT Time Calculation (min) 39 min    Equipment Utilized During Treatment Gait belt    Activity Tolerance Patient tolerated treatment well    Behavior During Therapy WFL for tasks assessed/performed             Past Medical History:  Diagnosis Date   Acute pancreatitis 01/22/2019   Diabetes mellitus    GERD (gastroesophageal reflux disease)    History of alcohol abuse    History of cocaine use    History of echocardiogram    a. Echo 4/14: Moderate LVH, vigorous LVEF, EF 65-70%, normal wall motion, grade 2 diastolic dysfunction, mildly dilated aortic root and ascending aorta, ascending aorta 40 mm, aortic root 38 mm, mild LAE   Hx of cardiovascular stress test    a. GXT 5/14: No ischemic changes  //  b. ETT-Myoview 3/16:  Low risk, no ischemia, EF 58%   Hyperlipidemia    Hypertension    Morbid obesity (HCC)    Paroxysmal atrial fibrillation (HCC)    Occurring in 2008, with several recurrence since then (including in the setting of + cocaine on UDS).   Sleep apnea    uses CPAP   SVT (supraventricular tachycardia) (HCC)    mid to long RP SVT 09/2019    Past Surgical History:  Procedure Laterality Date   APPENDECTOMY     ATRIAL FIBRILLATION ABLATION N/A 11/27/2019   Procedure: ATRIAL FIBRILLATION  ABLATION;  Surgeon: [name], MD;  Location: [facility];  Service: Cardiovascular;  Laterality: N/A;   ROTATOR CUFF REPAIR     SVT ABLATION N/A 11/27/2019   Procedure: SVT ABLATION;  Surgeon: [name], MD;  Location: [facility];  Service: Cardiovascular;  Laterality: N/A;   TONSILLECTOMY      Vitals:   05/18/21 0808  BP: 132/87     Subjective Assessment - 05/18/21 0808     Subjective No new complaitns. No falls or pain.    Pertinent History Recent hospitalization 2/4-11/21/20 (Dr. Marvel Plan and RN at d/c recommend OP PT)    Patient Stated Goals Pt's goals for therapy are to get back to where I was with strength and coordination.  11/22/20:  To get back where I was before this stroke.    Currently in Pain? No/denies    Pain Score 0-No pain             [facility] Adult PT Treatment/Exercise - 05/18/21 1637       Transfers   Transfers Sit to Stand;Stand to Sit    Sit to Stand 7: Independent    Stand to Sit 7: Independent      Ambulation/Gait   Ambulation/Gait Yes    Ambulation/Gait Assistance 6: Modified independent (Device/Increase time)  Ambulation/Gait Assistance Details no balance issues noted. no assistance needed.    Ambulation Distance (Feet) 500 Feet   x1   Assistive device None    Gait Pattern Step-through pattern;Abducted- right;Right foot flat;Decreased step length - left    Ambulation Surface Level;Unlevel;Indoor;Outdoor;Paved;Gravel;Grass      Exercises   Exercises Other Exercises    Other Exercises  reviewed and advanced pt's HEP for strengthening and balance. Refer to Medbridge for full details. Min guard assist for safety with balance ex's.              issued to HEP this session: Access Code: [email or web address] URL: [email or web address] Date: 05/18/2021 Prepared by: Sallyanne Kuster  Exercises Sit to/from Stand in Stride position - 1 x daily - 5 x weekly - 1 sets - 10 reps Seated Knee Extension with Resistance - 1 x daily - 5  x weekly - 1 sets - 10 reps Heel Toe Raises with Counter Support - 1 x daily - 5 x weekly - 3 sets - 10 reps Walking March - 2 x daily - 5 x weekly - 1 sets - 3-4 reps Wide Stance with Eyes Closed on Foam Pad - 1 x daily - 5 x weekly - 1 sets - 3-4 reps - 20 hold       PT Education - 05/18/21 1636     Education Details updated HEP    Person(s) Educated Patient    Methods Explanation;Demonstration;Verbal cues;Handout    Comprehension Verbalized understanding;Returned demonstration;Need further instruction;Verbal cues required              PT Short Term Goals - 04/25/21 0938       PT SHORT TERM GOAL #1   Title STGs = LTGs      PT SHORT TERM GOAL #4   Status --      PT SHORT TERM GOAL #5   Status --               PT Long Term Goals - 04/25/21 0940       PT LONG TERM GOAL #1   Title Pt will be independent with final progression of HEP for improved gait and balance.    Time 4    Period Weeks    Status New    Target Date 05/23/21      PT LONG TERM GOAL #2   Title Pt will improve FGA score to at least 22/30 for decreased fall risk    Baseline 18/30    Time 4    Period Weeks    Status New    Target Date 05/23/21      PT LONG TERM GOAL #3   Title Pt will improve Berg Balance score to at least 52/56 for decreased fall risk.    Baseline 51/56    Time 4    Period Weeks    Status New    Target Date 05/23/21      PT LONG TERM GOAL #4   Title Pt will improve gait velocity to at least 2 ft/sec for improved gait efficiency and decreased fall risk.    Baseline 1.82 ft/sec    Time 4    Period Weeks    Status New      PT LONG TERM GOAL #5   Title Pt will be able to amb at least 1000' on outdoor surfaces for safer community mobility and for performing yard work    Baseline Currently not mowing lawn for safety    Time  4    Period Weeks    Status New    Target Date 05/23/21      PT LONG TERM GOAL #6   Title Pt will have improved FOTO score to 66     Baseline 55    Time 4    Period Weeks    Status New    Target Date 05/23/21               UPDATED LONG TERM GOAL DATES FOR RE-CERT      PT LONG TERM GOAL #1   Title Pt will be independent with final progression of HEP for improved gait and balance.    Time 4    Period Weeks    Status New    Target Date 06/08/21      PT LONG TERM GOAL #2   Title Pt will improve FGA score to at least 22/30 for decreased fall risk    Baseline 18/30    Time 4    Period Weeks    Status New    Target Date 06/08/21      PT LONG TERM GOAL #3   Title Pt will improve Berg Balance score to at least 52/56 for decreased fall risk.    Baseline 51/56    Time 4    Period Weeks    Status New    Target Date 06/08/21      PT LONG TERM GOAL #4   Title Pt will improve gait velocity to at least 2 ft/sec for improved gait efficiency and decreased fall risk.    Baseline 1.82 ft/sec    Time 4    Period Weeks    Status New      PT LONG TERM GOAL #5   Title Pt will be able to amb at least 1000' on outdoor surfaces for safer community mobility and for performing yard work    Baseline Currently not mowing lawn for safety    Time 4    Period Weeks    Status New    Target Date 008/24/22      PT LONG TERM GOAL #6   Title Pt will have improved FOTO score to 66    Baseline 55    Time 4    Period Weeks    Status New    Target Date 06/08/21                 Plan - 05/18/21 0810     Clinical Impression Statement Today's skilled session initially addressed gait on outdoor surfaces with no issues noted or reported. Remainder of session focused on review and updating of HEP from previous episode of care. No issues reported with performance in session today. This is the pt's 1st follow up visit from eval due to transportation and scheduling issues. POC is set to end on 05/23/21. Spoke with primary PT who will recert so pt can go through all 4 weeks of planned PT. The pt continued to be  challegened by  activities that require increased vestibular imput. Will plan to focus on this in next few sessions.    Personal Factors and Comorbidities Comorbidity 3+    Comorbidities diabetes, HTN, morbid obesity, history of polysubstance abuse (cocaine and alcohol)    Examination-Activity Limitations Locomotion Level;Transfers;Stairs    Examination-Participation Restrictions Community Activity;Yard Work    Stability/Clinical Decision Making Evolving/Moderate complexity    Rehab Potential Good    PT Frequency 1x / week    PT Duration  4 weeks    PT Treatment/Interventions ADLs/Self Care Home Management;Gait training;Stair training;Functional mobility training;Therapeutic activities;Therapeutic exercise;Balance training;Neuromuscular re-education;Patient/family education;DME Instruction    PT Next Visit Plan monitor vitials.  Work on dynamic gait and balance with head nods (pt states this is the most unsteady) and decreased visual input.    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) 03/15/2021   Hypertensive retinopathy of both eyes, grade 2 03/15/2021   Healthcare maintenance 02/23/2021   Right sided weakness 11/19/2020   Mediastinal adenopathy 01/06/2020   Morbid obesity due to excess calories (HCC) 12/08/2016   Paroxysmal atrial fibrillation (HCC) 01/23/2013   Diabetes mellitus (HCC) 01/23/2013   Hypertension 01/23/2013   Tobacco user  01/23/2013   Chronic diastolic heart failure (HCC) 01/21/2013   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 MRN: [id number] Date of Birth: 06-11-1971

## 2021-05-19 NOTE — Anesthesia Preprocedure Evaluation (Addendum)
Anesthesia Evaluation  Patient identified by MRN, date of birth, ID band Patient awake    Reviewed: Allergy & Precautions, NPO status , Patient's Chart, lab work & pertinent test results  Airway Mallampati: III  TM Distance: >3 FB Neck ROM: Full    Dental no notable dental hx. (+) Teeth Intact, Dental Advisory Given,    Pulmonary sleep apnea and Continuous Positive Airway Pressure Ventilation , Current SmokerPatient did not abstain from smoking.,    Pulmonary exam normal breath sounds clear to auscultation       Cardiovascular hypertension, Pt. on medications Normal cardiovascular exam Rhythm:Regular Rate:Normal  04/15/21 Echo Technically difficult echo with poor image quality.  2. Left ventricular ejection fraction, by estimation, is 60 to 65%. The  left ventricle has normal function. The left ventricle has no regional  wall motion abnormalities. There is severe asymmetric left ventricular  hypertrophy. Left ventricular diastolic  parameters were normal.  3. Right ventricular systolic function is normal. The right ventricular  size is normal.  4. The mitral valve is grossly normal. Trivial mitral valve  regurgitation.  5. The aortic valve was not well visualized. Aortic valve regurgitation  is not visualized.  6. Aortic dilatation noted. There is mild dilatation of the ascending  aorta, measuring 40 mm.    Neuro/Psych TIAnegative psych ROS   GI/Hepatic Neg liver ROS, GERD  ,  Endo/Other  diabetesMorbid obesity  Renal/GU      Musculoskeletal negative musculoskeletal ROS (+)   Abdominal (+) + obese,   Peds  Hematology   Anesthesia Other Findings   Reproductive/Obstetrics                           Anesthesia Physical Anesthesia Plan  ASA: 3  Anesthesia Plan: MAC   Post-op Pain Management:    Induction:   PONV Risk Score and Plan: Treatment may vary due to age or medical  condition and Ondansetron  Airway Management Planned: Nasal Cannula and Natural Airway  Additional Equipment: None  Intra-op Plan:   Post-operative Plan:   Informed Consent: I have reviewed the patients History and Physical, chart, labs and discussed the procedure including the risks, benefits and alternatives for the proposed anesthesia with the patient or authorized representative who has indicated his/her understanding and acceptance.     Dental advisory given  Plan Discussed with: CRNA and Anesthesiologist  Anesthesia Plan Comments:        Anesthesia Quick Evaluation

## 2021-05-19 NOTE — Addendum Note (Signed)
Addended by: Alease Medina L on: 05/19/2021 08:38 AM   Modules accepted: Orders

## 2021-05-20 ENCOUNTER — Ambulatory Visit (HOSPITAL_COMMUNITY): Payer: 59 | Admitting: Anesthesiology

## 2021-05-20 ENCOUNTER — Ambulatory Visit (HOSPITAL_COMMUNITY)
Admission: RE | Admit: 2021-05-20 | Discharge: 2021-05-20 | Disposition: A | Discharge status: Home / Self Care | Payer: 59 | Attending: Cardiology | Admitting: Cardiology

## 2021-05-20 ENCOUNTER — Other Ambulatory Visit: Payer: Self-pay

## 2021-05-20 ENCOUNTER — Ambulatory Visit (HOSPITAL_BASED_OUTPATIENT_CLINIC_OR_DEPARTMENT_OTHER)
Admission: RE | Admit: 2021-05-20 | Discharge: 2021-05-20 | Disposition: A | Discharge status: Home / Self Care | Payer: 59 | Source: Ambulatory Visit | Attending: Student | Admitting: Student

## 2021-05-20 ENCOUNTER — Encounter (HOSPITAL_COMMUNITY)
Admission: RE | Disposition: A | Discharge status: Home / Self Care | Payer: Self-pay | Source: Home / Self Care | Attending: Cardiology

## 2021-05-20 DIAGNOSIS — Z7982 Long term (current) use of aspirin: Secondary | ICD-10-CM | POA: Diagnosis not present

## 2021-05-20 DIAGNOSIS — Z91010 Allergy to peanuts: Secondary | ICD-10-CM | POA: Insufficient documentation

## 2021-05-20 DIAGNOSIS — I10 Essential (primary) hypertension: Secondary | ICD-10-CM | POA: Diagnosis not present

## 2021-05-20 DIAGNOSIS — G4733 Obstructive sleep apnea (adult) (pediatric): Secondary | ICD-10-CM | POA: Diagnosis not present

## 2021-05-20 DIAGNOSIS — F17210 Nicotine dependence, cigarettes, uncomplicated: Secondary | ICD-10-CM | POA: Diagnosis not present

## 2021-05-20 DIAGNOSIS — I639 Cerebral infarction, unspecified: Secondary | ICD-10-CM | POA: Diagnosis present

## 2021-05-20 DIAGNOSIS — Z91018 Allergy to other foods: Secondary | ICD-10-CM | POA: Diagnosis not present

## 2021-05-20 DIAGNOSIS — Z79899 Other long term (current) drug therapy: Secondary | ICD-10-CM | POA: Insufficient documentation

## 2021-05-20 DIAGNOSIS — Z7984 Long term (current) use of oral hypoglycemic drugs: Secondary | ICD-10-CM | POA: Insufficient documentation

## 2021-05-20 DIAGNOSIS — I119 Hypertensive heart disease without heart failure: Secondary | ICD-10-CM | POA: Insufficient documentation

## 2021-05-20 DIAGNOSIS — Z6841 Body Mass Index (BMI) 40.0 and over, adult: Secondary | ICD-10-CM | POA: Insufficient documentation

## 2021-05-20 DIAGNOSIS — Z91013 Allergy to seafood: Secondary | ICD-10-CM | POA: Diagnosis not present

## 2021-05-20 DIAGNOSIS — Z7901 Long term (current) use of anticoagulants: Secondary | ICD-10-CM | POA: Diagnosis not present

## 2021-05-20 DIAGNOSIS — Z882 Allergy status to sulfonamides status: Secondary | ICD-10-CM | POA: Diagnosis not present

## 2021-05-20 DIAGNOSIS — I480 Paroxysmal atrial fibrillation: Secondary | ICD-10-CM | POA: Insufficient documentation

## 2021-05-20 DIAGNOSIS — Z794 Long term (current) use of insulin: Secondary | ICD-10-CM | POA: Insufficient documentation

## 2021-05-20 HISTORY — PX: BUBBLE STUDY: SHX6837

## 2021-05-20 HISTORY — PX: TEE WITHOUT CARDIOVERSION: SHX5443

## 2021-05-20 LAB — GLUCOSE, CAPILLARY: Glucose-Capillary: 275 mg/dL — ABNORMAL HIGH (ref 70–99)

## 2021-05-20 SURGERY — ECHOCARDIOGRAM, TRANSESOPHAGEAL
Anesthesia: Monitor Anesthesia Care

## 2021-05-20 MED ORDER — PROPOFOL 500 MG/50ML IV EMUL
INTRAVENOUS | Status: DC | PRN
  Administered 2021-05-20: 150 ug/kg/min via INTRAVENOUS

## 2021-05-20 MED ORDER — SODIUM CHLORIDE 0.9 % IV SOLN
INTRAVENOUS | Status: DC
Start: 2021-05-20 — End: 2021-05-20

## 2021-05-20 MED ORDER — PHENYLEPHRINE 40 MCG/ML (10ML) SYRINGE FOR IV PUSH (FOR BLOOD PRESSURE SUPPORT)
PREFILLED_SYRINGE | INTRAVENOUS | Status: DC | PRN
  Administered 2021-05-20: 80 ug via INTRAVENOUS

## 2021-05-20 NOTE — Discharge Instructions (Signed)

## 2021-05-20 NOTE — Anesthesia Postprocedure Evaluation (Signed)
Anesthesia Post Note  Patient: [name].  Procedure(s) Performed: TRANSESOPHAGEAL ECHOCARDIOGRAM (TEE) BUBBLE STUDY     Patient location during evaluation: Endoscopy Anesthesia Type: MAC Level of consciousness: awake and alert Pain management: pain level controlled Vital Signs Assessment: post-procedure vital signs reviewed and stable Respiratory status: spontaneous breathing, nonlabored ventilation, respiratory function stable and patient connected to nasal cannula oxygen Cardiovascular status: blood pressure returned to baseline and stable Postop Assessment: no apparent nausea or vomiting Anesthetic complications: no   No notable events documented.  Last Vitals:  Vitals:   05/20/21 1114 05/20/21 1124  BP: 114/74 123/72  Pulse: 70 68  Resp: (!) 23 20  Temp:    SpO2: 100% 100%    Last Pain:  Vitals:   05/20/21 1114  TempSrc:   PainSc: 0-No pain                 Barnet Glasgow

## 2021-05-20 NOTE — Transfer of Care (Signed)
Immediate Anesthesia Transfer of Care Note  Patient: [name].  Procedure(s) Performed: TRANSESOPHAGEAL ECHOCARDIOGRAM (TEE) BUBBLE STUDY  Patient Location: Endoscopy Unit  Anesthesia Type:MAC  Level of Consciousness: awake, alert  and oriented  Airway & Oxygen Therapy: Patient Spontanous Breathing and Patient connected to nasal cannula oxygen  Post-op Assessment: Report given to RN and Post -op Vital signs reviewed and stable  Post vital signs: Reviewed and stable  Last Vitals:  Vitals Value Taken Time  BP 156/90 05/20/21 1105  Temp    Pulse 69 05/20/21 1106  Resp 23 05/20/21 1106  SpO2 100 % 05/20/21 1106  Vitals shown include unvalidated device data.  Last Pain:  Vitals:   05/20/21 1104  TempSrc: Oral  PainSc:          Complications: No notable events documented.

## 2021-05-20 NOTE — Interval H&P Note (Signed)
History and Physical Interval Note:  05/20/2021 10:17 AM  [name].  has presented today for surgery, with the diagnosis of CRYPTOGENIC STROKE.  The various methods of treatment have been discussed with the patient and family. After consideration of risks, benefits and other options for treatment, the patient has consented to  Procedure(s): TRANSESOPHAGEAL ECHOCARDIOGRAM (TEE) (N/A) as a surgical intervention.  The patient's history has been reviewed, patient examined, no change in status, stable for surgery.  I have reviewed the patient's chart and labs.  Questions were answered to the patient's satisfaction.     Little Ishikawa

## 2021-05-20 NOTE — CV Procedure (Signed)
     TRANSESOPHAGEAL ECHOCARDIOGRAM   NAME:  Raymondo Band.   MRN: [id number] DOB:  03/30/1971   ADMIT DATE: 05/20/2021  INDICATIONS: CVA  PROCEDURE:   Informed consent was obtained prior to the procedure. The risks, benefits and alternatives for the procedure were discussed and the patient comprehended these risks.  Risks include, but are not limited to, cough, sore throat, vomiting, nausea, somnolence, esophageal and stomach trauma or perforation, bleeding, low blood pressure, aspiration, pneumonia, infection, trauma to the teeth and death.    After a procedural time-out, the oropharynx was anesthetized and the patient was sedated by the anesthesia service. The transesophageal probe was inserted in the esophagus and stomach without difficulty and multiple views were obtained. Anesthesia was monitored by Huel Cote, CRNA.    COMPLICATIONS:    There were no immediate complications.  FINDINGS:  No cardiac thrombus, mass, or vegetation seen.  Negative bubble study.  Epifanio Lesches MD [facility]  [address] [place], [place] [address] [phone]   11:02 AM

## 2021-05-20 NOTE — Progress Notes (Signed)
  Echocardiogram Echocardiogram Transesophageal has been performed.  [name] 05/20/2021, 11:17 AM

## 2021-05-20 NOTE — Anesthesia Procedure Notes (Signed)
Procedure Name: MAC Date/Time: 05/20/2021 10:30 AM Performed by: Griffin Dakin, CRNA Pre-anesthesia Checklist: Patient identified, Emergency Drugs available, Suction available, Patient being monitored and Timeout performed Patient Re-evaluated:Patient Re-evaluated prior to induction Oxygen Delivery Method: Nasal cannula Induction Type: IV induction Placement Confirmation: positive ETCO2 and breath sounds checked- equal and bilateral Dental Injury: Teeth and Oropharynx as per pre-operative assessment

## 2021-05-22 NOTE — Progress Notes (Signed)
HPI  male smoker followed for management of OSA and narcolepsy with cataplexy, complicated by allergic rhinitis, atrial fibrillation, dCHF, DM, HTN NPSG 11/23/00- AHI 20 per hour. Hypnagogic hallucination associated with dreaming. 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 Vivid dreams as soon as he falls asleep Multiple Sleep Latency Test 10/30/2012-pathologic daytime hypersomnia, nonspecific, compatible with idiopathic hypersomnia or narcolepsy. Mean latency 0.9 minutes with one sleep onset REM event CT chest 12/05/19- mediastinal and bilateral adenopathy, interstitial prominence, ASCVD NPSG 05/03/21- AHI 29.6/ hr, desaturation to 81%, CPAP to 16 (O2 sat on CPAP 16 was 94%), body weight  306 lbs   Suggest CPAP auto 10-20  =============================================================  03/01/21- 50 year old male Smoker (14 pkyrs)  followed for management of OSA and narcolepsy with cataplexy,, Mediastinal / Hilar Adenopathy, ILD,  complicated by allergic rhinitis, atrial fibrillation/Eliquis flecainide/ Xarelto, dCHF, Diastolic Dysfunction, CAD, CVA, DM2, HTN, Pancreatitis, Obesity, Tobacco use,  - Adderall XR 30 mg, 1 daily CPAP auto 4-20/ [facility] Download-   Body weight today- 306 lbs Covid vax- 3Phizer -----Winded easily, stroke in Feb 2022, fatigued easily, not wearing CPAP Still smoking 1/2 ppd.  Hasn't been able to tolerate CPAP. Sleeps poorly at night. Fights daytime sleepiness. More intrusive Cataplexy, triggered by startle, laughter. Adderall doesn't last past midmorning. Used Benedetto Goad to get here today  05/23/21- 50 year old male Smoker (14 pkyrs)  followed for management of OSA and narcolepsy with cataplexy,, Mediastinal / Hilar Adenopathy, ILD,  complicated by allergic rhinitis, atrial fibrillation/Eliquis flecainide/ Xarelto, dCHF, Diastolic Dysfunction, CAD, CVA, DM2, HTN, Pancreatitis, Obesity, Tobacco use,  Covid vax- 3 Phizer - Adderall XR 30 mg, 1 daily CPAP auto 4-20/ [facility]             AirSense 10 AutoSet NPSG 05/03/21- AHI 29.6/ hr, desaturation to 81%, CPAP to 16 (O2 sat on CPAP 16 was 94%), body weight  306 lbs   Suggest CPAP auto 10-20 Download-compliance 20%, AHI 2.6/hr     Many short nights Body weight today-308 lbs We reviewed updated sleep study and discussed download with emphasis on compliance and comfort.  We will change auto range to 10-20. He feels daytime sleepiness is controlled with naps and his Adderall taken as directed. Unfortunately he is smoking and we discussed smoking cessation. CXR 04/14/21- IMPRESSION: Cardiomegaly with mild pulmonary venous congestion and bilateral interstitial prominence suggesting mild CHF.  ROS-see HPI  + = positive Constitutional:   No-   weight loss, night sweats, fevers, +chills,  +fatigue, lassitude. HEENT:   No-  headaches, difficulty swallowing, tooth/dental problems, sore throat,       No-  sneezing, itching, ear ache, + nasal congestion, post nasal drip,  CV:  No-   chest pain, orthopnea, PND, swelling in lower extremities, anasarca, dizziness, palpitations Resp: + shortness of breath with exertion or at rest.              No-   productive cough,  No non-productive cough,  No- coughing up of blood.              No-   change in color of mucus.  No- wheezing.   Skin: No-   rash or lesions. GI:  No-   heartburn, indigestion, abdominal pain, nausea, vomiting,  GU:  MS:  No-   joint pain or swelling.  Neuro-     +HPI Psych:  No- change in mood or affect. No depression or anxiety.  No memory loss.  OBJ- Physical Exam   General- Alert/ calm, Oriented, Affect-appropriate, Distress-  none acute, +morbidly obese,   Skin- rash-none, lesions- none, excoriation- none Lymphadenopathy- none Head- atraumatic            Eyes- Gross vision intact, PERRLA, conjunctivae and secretions clear            Ears- Hearing, canals-normal            Nose- rhinitis/ turbinate edema, no-Septal dev, mucus, polyps, erosion, perforation              Throat- Mallampati III , mucosa clear , drainage- none, tonsils- atrophic Neck- flexible , trachea midline, no stridor , thyroid nl, carotid no bruit Chest - symmetrical excursion , unlabored           Heart/CV- RRR today, no murmur , no gallop  , no rub, nl s1 s2                           - JVD- none , edema- none, stasis changes- none, varices- none           Lung- clear to P&A, wheeze- none, cough- none , dullness-none, rub- none           Chest wall-  Abd-  Br/ Gen/ Rectal- Not done, not indicated Extrem- no edema

## 2021-05-23 ENCOUNTER — Ambulatory Visit (INDEPENDENT_AMBULATORY_CARE_PROVIDER_SITE_OTHER): Payer: 59 | Admitting: Internal Medicine

## 2021-05-23 ENCOUNTER — Telehealth: Payer: Self-pay

## 2021-05-23 ENCOUNTER — Encounter: Payer: Self-pay | Admitting: Internal Medicine

## 2021-05-23 ENCOUNTER — Other Ambulatory Visit: Payer: Self-pay

## 2021-05-23 VITALS — BP 120/72 | HR 76 | Temp 98.3°F | Ht 69.0 in | Wt 308.0 lb

## 2021-05-23 DIAGNOSIS — G47411 Narcolepsy with cataplexy: Secondary | ICD-10-CM

## 2021-05-23 DIAGNOSIS — F17200 Nicotine dependence, unspecified, uncomplicated: Secondary | ICD-10-CM

## 2021-05-23 DIAGNOSIS — G4733 Obstructive sleep apnea (adult) (pediatric): Secondary | ICD-10-CM

## 2021-05-23 NOTE — Telephone Encounter (Signed)
Per Dr Maple Hudson pt needs an appt for a Smoking cessation.

## 2021-05-23 NOTE — Telephone Encounter (Addendum)
Per Dr Maple Hudson, pt needs an Appt for smoking cessation program.

## 2021-05-23 NOTE — Patient Instructions (Signed)
Order- DME Christoper Allegra- please change autopap range to 10-20. Continue mask of choice, humidifier, supplies, [employer]/ card  We will enter your name with our smoking cessation group.

## 2021-05-24 ENCOUNTER — Encounter (HOSPITAL_COMMUNITY): Payer: Self-pay | Admitting: Cardiology

## 2021-05-25 ENCOUNTER — Telehealth: Payer: Self-pay | Admitting: Internal Medicine

## 2021-05-25 MED ORDER — AMPHETAMINE-DEXTROAMPHET ER 30 MG PO CP24: 30.0000 mg | ORAL_CAPSULE | Freq: Every day | ORAL | 0 refills | Status: DC | PRN

## 2021-05-25 NOTE — Telephone Encounter (Signed)
Adderall refilled

## 2021-05-25 NOTE — Telephone Encounter (Signed)
Call made to patient, confirmed DOB. Requesting refill of adderall:  Last OV:05/23/21 Last Filled: 04/27/21  CY please advise. Thanks :)

## 2021-05-26 ENCOUNTER — Ambulatory Visit: Payer: 59 | Admitting: Physical Therapy

## 2021-05-26 ENCOUNTER — Encounter: Payer: Self-pay | Admitting: Physical Therapy

## 2021-05-26 ENCOUNTER — Other Ambulatory Visit: Payer: Self-pay

## 2021-05-26 VITALS — BP 122/88 | HR 62

## 2021-05-26 DIAGNOSIS — R2681 Unsteadiness on feet: Secondary | ICD-10-CM

## 2021-05-26 DIAGNOSIS — M6281 Muscle weakness (generalized): Secondary | ICD-10-CM

## 2021-05-26 DIAGNOSIS — R2689 Other abnormalities of gait and mobility: Secondary | ICD-10-CM

## 2021-05-26 NOTE — Telephone Encounter (Signed)
Called and spoke with Patient.  Patient aware prescription sent to pharmacy.  Nothing further at this time.

## 2021-05-26 NOTE — Therapy (Signed)
[facility] [address] [place], [place], [address] Phone: [phone]   Fax:  [phone]  Physical Therapy Treatment  Patient Details  Name: [name]. MRN: [id number] Date of Birth: [birth year] Referring Provider (PT): Reymundo Poll   Encounter Date: 05/26/2021   PT End of Session - 05/26/21 0937     Visit Number 3    Number of Visits 5    Date for PT Re-Evaluation 06/08/21    Authorization Type UHC-60 visit limit PT/OT/speech (each visit of each type counts); 40 TOTAL visits of all therapies in 2022    PT Start Time 0933    PT Stop Time 1015    PT Time Calculation (Douglas) 42 Douglas    Equipment Utilized During Treatment Gait belt    Activity Tolerance Patient tolerated treatment well    Behavior During Therapy WFL for tasks assessed/performed             Past Medical History:  Diagnosis Date   Acute pancreatitis 01/22/2019   Diabetes mellitus    GERD (gastroesophageal reflux disease)    History of alcohol abuse    History of cocaine use    History of echocardiogram    a. Echo 4/14: Moderate LVH, vigorous LVEF, EF 65-70%, normal wall motion, grade 2 diastolic dysfunction, mildly dilated aortic root and ascending aorta, ascending aorta 40 mm, aortic root 38 mm, mild LAE   Hx of cardiovascular stress test    a. GXT 5/14: No ischemic changes  //  b. ETT-Myoview 3/16:  Low risk, no ischemia, EF 58%   Hyperlipidemia    Hypertension    Morbid obesity (HCC)    Paroxysmal atrial fibrillation (HCC)    Occurring in 2008, with several recurrence since then (including in the setting of + cocaine on UDS).   Sleep apnea    uses CPAP   SVT (supraventricular tachycardia) (HCC)    mid to long RP SVT 09/2019    Past Surgical History:  Procedure Laterality Date   APPENDECTOMY     ATRIAL FIBRILLATION ABLATION N/A 11/27/2019   Procedure: ATRIAL FIBRILLATION ABLATION;  Surgeon: [name], MD;   Location: [facility] INVASIVE CV LAB;  Service: Cardiovascular;  Laterality: N/A;   BUBBLE STUDY  05/20/2021   Procedure: BUBBLE STUDY;  Surgeon: Little Ishikawa, MD;  Location: [facility] ENDOSCOPY;  Service: Cardiovascular;;   ROTATOR CUFF REPAIR     SVT ABLATION N/A 11/27/2019   Procedure: SVT ABLATION;  Surgeon: [name], MD;  Location: [facility] INVASIVE CV LAB;  Service: Cardiovascular;  Laterality: N/A;   TEE WITHOUT CARDIOVERSION N/A 05/20/2021   Procedure: TRANSESOPHAGEAL ECHOCARDIOGRAM (TEE);  Surgeon: Little Ishikawa, MD;  Location: [facility] ENDOSCOPY;  Service: Cardiovascular;  Laterality: N/A;   TONSILLECTOMY      Vitals:   05/26/21 0936 05/26/21 1008  BP: 122/79 122/88  Pulse: 61 62     Subjective Assessment - 05/26/21 0936     Subjective No new complaints. No falls or pain. HEP is going well, still challenging.    Pertinent History Recent hospitalization 2/4-11/21/20 (Dr. Marvel Plan and RN at d/c recommend OP PT)    Patient Stated Goals Pt's goals for therapy are to get back to where I was with strength and coordination.  11/22/20:  To get back where I was before this stroke.    Currently in Pain? No/denies    Pain Score 0-No pain  [facility] Adult PT Treatment/Exercise - 05/26/21 0943       Transfers   Transfers Sit to Stand;Stand to Sit    Sit to Stand 7: Independent    Stand to Sit 7: Independent      Ambulation/Gait   Ambulation/Gait Yes    Ambulation/Gait Assistance 6: Modified independent (Device/Increase time)    Ambulation/Gait Assistance Details around clinic with session.    Assistive device None    Gait Pattern Step-through pattern;Abducted- right;Right foot flat;Decreased step length - left    Ambulation Surface Level;Indoor      Neuro Re-ed    Neuro Re-ed Details  for balance/NMR: gait along ~50 foot hallway forward gait with head movements left<>fwd<>right, then up<>fwd<>down for 4 laps each with Douglas guard to Douglas assist. increased imbalance,  veering noted with up/down head movements.                 Balance Exercises - 05/26/21 0951       Balance Exercises: Standing   Rockerboard Anterior/posterior;Lateral;Head turns;EO;EC;30 seconds;10 reps;Limitations    Rockerboard Limitations performed both ways on the balance board: rocking the board with emphasis on tall posture with EO, progressing to EC for ~10 reps. then holding the board steady for EC 30 sec's x 3 reps, progressing to EC head movements left<>right, up<>down for ~10 reps each. Douglas guard to Douglas assist for balance with cues on posture/weight shifitng for balance assistance. intermittent touch to bars as needed for balance as well.                 PT Short Term Goals - 04/25/21 0938       PT SHORT TERM GOAL #1   Title STGs = LTGs      PT SHORT TERM GOAL #4   Status --      PT SHORT TERM GOAL #5   Status --               PT Long Term Goals - 05/26/21 1520       PT LONG TERM GOAL #1   Title Pt will be independent with final progression of HEP for improved gait and balance.    Time 4    Period Weeks    Status New    Target Date 06/08/21      PT LONG TERM GOAL #2   Title Pt will improve FGA score to at least 22/30 for decreased fall risk    Baseline 18/30    Time 4    Period Weeks    Status New    Target Date 06/08/21      PT LONG TERM GOAL #3   Title Pt will improve Berg Balance score to at least 52/56 for decreased fall risk.    Baseline 51/56    Time 4    Period Weeks    Status New    Target Date 06/08/21      PT LONG TERM GOAL #4   Title Pt will improve gait velocity to at least 2 ft/sec for improved gait efficiency and decreased fall risk.    Baseline 1.82 ft/sec    Time 4    Period Weeks    Status New    Target Date 06/08/21      PT LONG TERM GOAL #5   Title Pt will be able to amb at least 1000' on outdoor surfaces for safer community mobility and for performing yard work    Baseline Currently not mowing lawn for  safety    Time 4    Period Weeks    Status New      PT LONG TERM GOAL #6   Title Pt will have improved FOTO score to 66    Baseline 55    Time 4    Period Weeks    Status New    Target Date 06/08/21                   Plan - 05/26/21 6962     Clinical Impression Statement Today's skilled session continued to focus on high level gait and balance training. Pt most challeged with vertical head movements with gait and with vision removed on complaint surfaces this session. No issues noted or reported in session. BP remained WFL with session today. The pt is progressing toward goals and should benefit from continued PT to progress toward unmet LTGs.    Personal Factors and Comorbidities Comorbidity 3+    Comorbidities diabetes, HTN, morbid obesity, history of polysubstance abuse (cocaine and alcohol)    Examination-Activity Limitations Locomotion Level;Transfers;Stairs    Examination-Participation Restrictions Community Activity;Yard Work    Stability/Clinical Decision Making Evolving/Moderate complexity    Rehab Potential Good    PT Frequency 2x / week    PT Duration 8 weeks   including week of recert, 11/22/2020   PT Treatment/Interventions ADLs/Self Care Home Management;Gait training;Stair training;Functional mobility training;Therapeutic activities;Therapeutic exercise;Balance training;Neuromuscular re-education;Patient/family education;DME Instruction    PT Next Visit Plan monitor vitials.  Work on dynamic gait and balance with head nods (pt states this is the most unsteady) and decreased visual input.    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) 03/15/2021   Hypertensive retinopathy of both eyes, grade 2 03/15/2021   Healthcare maintenance 02/23/2021   Right sided weakness 11/19/2020   Cerebrovascular accident (CVA) (HCC) 06/01/2020   Mediastinal adenopathy 01/06/2020   Morbid obesity due to excess calories (HCC) 12/08/2016   Paroxysmal atrial fibrillation (HCC) 01/23/2013   Diabetes mellitus (HCC) 01/23/2013   Hypertension 01/23/2013   Tobacco user 01/23/2013   Chronic diastolic heart failure (HCC) 01/21/2013   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   Seasonal and perennial allergic rhinitis 02/13/2008   Dyslipidemia 02/12/2008   Cocaine abuse (HCC) 02/12/2008   Obstructive sleep apnea 02/12/2008    [name], PTA, [facility] [address] [place], [place] [address] [phone] 05/26/21, 3:25 PM   Name: [name]. MRN: [id number] Date of Birth: 05-Feb-1971

## 2021-05-30 ENCOUNTER — Telehealth: Payer: Self-pay | Admitting: Student

## 2021-05-30 ENCOUNTER — Other Ambulatory Visit: Payer: Self-pay

## 2021-05-30 ENCOUNTER — Ambulatory Visit: Payer: 59 | Admitting: Physical Therapy

## 2021-05-30 VITALS — BP 118/81 | HR 73

## 2021-05-30 DIAGNOSIS — R2689 Other abnormalities of gait and mobility: Secondary | ICD-10-CM

## 2021-05-30 DIAGNOSIS — R2681 Unsteadiness on feet: Secondary | ICD-10-CM

## 2021-05-30 NOTE — Therapy (Signed)
[facility] [address] [place], [place], [address] Phone: [phone]   Fax:  [phone]  Physical Therapy Treatment  Patient Details  Name: [name]. MRN: [id number] Date of Birth: [birth year] Referring Provider (PT): Reymundo Poll   Encounter Date: 05/30/2021   PT End of Session - 05/30/21 0808     Visit Number 4    Number of Visits 5    Date for PT Re-Evaluation 06/08/21    Authorization Type UHC-60 visit limit PT/OT/speech (each visit of each type counts); 40 TOTAL visits of all therapies in 2022    PT Start Time 0806    PT Stop Time 0845    PT Time Calculation (min) 39 min    Equipment Utilized During Treatment Gait belt    Activity Tolerance Patient tolerated treatment well    Behavior During Therapy WFL for tasks assessed/performed             Past Medical History:  Diagnosis Date   Acute pancreatitis 01/22/2019   Diabetes mellitus    GERD (gastroesophageal reflux disease)    History of alcohol abuse    History of cocaine use    History of echocardiogram    a. Echo 4/14: Moderate LVH, vigorous LVEF, EF 65-70%, normal wall motion, grade 2 diastolic dysfunction, mildly dilated aortic root and ascending aorta, ascending aorta 40 mm, aortic root 38 mm, mild LAE   Hx of cardiovascular stress test    a. GXT 5/14: No ischemic changes  //  b. ETT-Myoview 3/16:  Low risk, no ischemia, EF 58%   Hyperlipidemia    Hypertension    Morbid obesity (HCC)    Paroxysmal atrial fibrillation (HCC)    Occurring in 2008, with several recurrence since then (including in the setting of + cocaine on UDS).   Sleep apnea    uses CPAP   SVT (supraventricular tachycardia) (HCC)    mid to long RP SVT 09/2019    Past Surgical History:  Procedure Laterality Date   APPENDECTOMY     ATRIAL FIBRILLATION ABLATION N/A 11/27/2019   Procedure: ATRIAL FIBRILLATION ABLATION;  Surgeon: [name], MD;   Location: [facility] INVASIVE CV LAB;  Service: Cardiovascular;  Laterality: N/A;   BUBBLE STUDY  05/20/2021   Procedure: BUBBLE STUDY;  Surgeon: Little Ishikawa, MD;  Location: [facility] ENDOSCOPY;  Service: Cardiovascular;;   ROTATOR CUFF REPAIR     SVT ABLATION N/A 11/27/2019   Procedure: SVT ABLATION;  Surgeon: [name], MD;  Location: [facility] INVASIVE CV LAB;  Service: Cardiovascular;  Laterality: N/A;   TEE WITHOUT CARDIOVERSION N/A 05/20/2021   Procedure: TRANSESOPHAGEAL ECHOCARDIOGRAM (TEE);  Surgeon: Little Ishikawa, MD;  Location: [facility] ENDOSCOPY;  Service: Cardiovascular;  Laterality: N/A;   TONSILLECTOMY      Vitals:   05/30/21 0812  BP: 118/81  Pulse: 73     Subjective Assessment - 05/30/21 0808     Subjective Nothing new, no falls, no changes since last visit.    Pertinent History Recent hospitalization 2/4-11/21/20 (Dr. Marvel Plan and RN at d/c recommend OP PT)    Patient Stated Goals Pt's goals for therapy are to get back to where I was with strength and coordination.  11/22/20:  To get back where I was before this stroke.    Currently in Pain? No/denies                        Access Code: [email or web address] URL:  [email or web address] Date: 05/30/2021 Prepared by: Lonia BloodAmy Kayvan Hoefling  Exercises-Reviewed HEP from previous visit.  Pt return demo understanding with minimal cues.  Sit to/from Stand in Stride position - 1 x daily - 5 x weekly - 1 sets - 10 reps Seated Knee Extension with Resistance - 1 x daily - 5 x weekly - 3 sets - 10 reps - 3 hold Heel Toe Raises with Counter Support - 1 x daily - 5 x weekly - 3 sets - 10 reps Walking March - 2 x daily - 5 x weekly - 1 sets - 3-4 reps Wide Stance with Eyes Closed on Foam Pad - 1 x daily - 5 x weekly - 1 sets - 3-4 reps - 20 hold  (Peforms 10 seconds, with cues to hold support at chair for stability)        [facility] Adult PT Treatment/Exercise - 05/30/21 0001       Ambulation/Gait   Ambulation/Gait Yes     Ambulation/Gait Assistance 5: Supervision;4: Min assist    Ambulation/Gait Assistance Details Pt requests to try cane; at end of session, brief trial with SPC and small quad tip    Ambulation Distance (Feet) 115 Feet   with cane; additional gait between activities, no device   Assistive device None;Straight cane   SPC with small rubber quad tip base   Gait Pattern Step-through pattern;Abducted- right;Right foot flat;Decreased step length - left    Ambulation Surface Level;Indoor    Gait Comments PT provides verbal cues and tactile assistance for cane sequence.  Pt tends to leave cane on ground too long for accurate sequence with RLE.                 Balance Exercises - 05/30/21 0001       Balance Exercises: Standing   Standing Eyes Opened Wide (BOA);Narrow base of support (BOS);Foam/compliant surface;Limitations    Standing Eyes Opened Limitations Head turns, head nods x 10 reps each; standing on 2 pillows; addition head nods/turns x 5 reps feet wide (added to HEP)    Gait with Head Turns Forward;Upper extremity support;3 reps;Limitations    Gait with Head Turns Limitations Along outside of parallel bars; cues to slow head motions and for foot clearance               PT Education - 05/30/21 0845     Education Details HEP updates-see instructions    Person(s) Educated Patient    Methods Explanation;Demonstration;Handout;Verbal cues    Comprehension Verbalized understanding;Returned demonstration;Verbal cues required;Need further instruction              PT Short Term Goals - 04/25/21 0938       PT SHORT TERM GOAL #1   Title STGs = LTGs      PT SHORT TERM GOAL #4   Status --      PT SHORT TERM GOAL #5   Status --               PT Long Term Goals - 05/26/21 1520       PT LONG TERM GOAL #1   Title Pt will be independent with final progression of HEP for improved gait and balance.    Time 4    Period Weeks    Status New    Target Date 06/08/21       PT LONG TERM GOAL #2   Title Pt will improve FGA score to at least 22/30 for decreased fall risk    Baseline  18/30    Time 4    Period Weeks    Status New    Target Date 06/08/21      PT LONG TERM GOAL #3   Title Pt will improve Berg Balance score to at least 52/56 for decreased fall risk.    Baseline 51/56    Time 4    Period Weeks    Status New    Target Date 06/08/21      PT LONG TERM GOAL #4   Title Pt will improve gait velocity to at least 2 ft/sec for improved gait efficiency and decreased fall risk.    Baseline 1.82 ft/sec    Time 4    Period Weeks    Status New    Target Date 06/08/21      PT LONG TERM GOAL #5   Title Pt will be able to amb at least 1000' on outdoor surfaces for safer community mobility and for performing yard work    Baseline Currently not mowing lawn for safety    Time 4    Period Weeks    Status New      PT LONG TERM GOAL #6   Title Pt will have improved FOTO score to 66    Baseline 55    Time 4    Period Weeks    Status New    Target Date 06/08/21                   Plan - 05/30/21 1129     Clinical Impression Statement Reviewed HEP and made additions to include head turns static and controlled dynamic positions (with UE support at counter).  He needs UE support for additional stability with vision removed and with eyes open/head turns on compliant surfaces.  He requests trial of cane for use in community; at end of session, performed brief trial with SPC and small quad tip, but pt has difficulty sequencing.  Will try again with regular SPC next visit.    Personal Factors and Comorbidities Comorbidity 3+    Comorbidities diabetes, HTN, morbid obesity, history of polysubstance abuse (cocaine and alcohol)    Examination-Activity Limitations Locomotion Level;Transfers;Stairs    Examination-Participation Restrictions Community Activity;Yard Work    Stability/Clinical Decision Making Evolving/Moderate complexity    Rehab Potential  Good    PT Frequency 2x / week    PT Duration 8 weeks   including week of recert, 11/22/2020   PT Treatment/Interventions ADLs/Self Care Home Management;Gait training;Stair training;Functional mobility training;Therapeutic activities;Therapeutic exercise;Balance training;Neuromuscular re-education;Patient/family education;DME Instruction    PT Next Visit Plan monitor vitials.  Next visit is last scheduled visit and time to assess LTGs.  Trial SPC again and discuss d/c versus ?renew    PT Home Exercise Plan Access Code: [email or web address]    Consulted and Agree with Plan of Care Patient             Patient will benefit from skilled therapeutic intervention in order to improve the following deficits and impairments:  Abnormal gait, Difficulty walking, Decreased balance, Decreased mobility  Visit Diagnosis: Unsteadiness on feet  Other abnormalities of gait and mobility     Problem List Patient Active Problem List   Diagnosis Date Noted   History of CVA (cerebrovascular accident) 04/14/2021   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) 03/15/2021   Hypertensive retinopathy of both eyes, grade 2 03/15/2021   Healthcare maintenance 02/23/2021   Right sided weakness 11/19/2020   Cerebrovascular accident (CVA) (HCC) 06/01/2020  Mediastinal adenopathy 01/06/2020   Morbid obesity due to excess calories (HCC) 12/08/2016   Paroxysmal atrial fibrillation (HCC) 01/23/2013   Diabetes mellitus (HCC) 01/23/2013   Hypertension 01/23/2013   Tobacco user 01/23/2013   Chronic diastolic heart failure (HCC) 01/21/2013   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 05/30/2021, 11:34 AM Gean Maidens., PT  [facility] [address] [place], [place],  [address] Phone: [phone]   Fax:  [phone]  Name: [name]. MRN: [id number] Date of Birth: Sep 24, 1971

## 2021-05-30 NOTE — Patient Instructions (Signed)
Access Code: [email or web address] URL: [email or web address] Date: 05/30/2021 Prepared by: Lonia Blood  Exercises Sit to/from Stand in Stride position - 1 x daily - 5 x weekly - 1 sets - 10 reps Seated Knee Extension with Resistance - 1 x daily - 5 x weekly - 3 sets - 10 reps - 3 hold Heel Toe Raises with Counter Support - 1 x daily - 5 x weekly - 3 sets - 10 reps Walking March - 2 x daily - 5 x weekly - 1 sets - 3-4 reps Wide Stance with Eyes Closed on Foam Pad - 1 x daily - 5 x weekly - 1 sets - 3-4 reps - 20 hold  Added to HEP 05/30/21: Standing Balance in Corner - 1 x daily - 5 x weekly - 1 sets - 10 reps Walking with Head Rotation - 1 x daily - 5 x weekly - 1 sets - 3-4 reps

## 2021-05-30 NOTE — Telephone Encounter (Signed)
#  120 with 1 refill sent 04/17/21. Spoke with Alisha at [facility]. States they have Rx on file and will get it ready for him now. Patient notified.

## 2021-05-30 NOTE — Telephone Encounter (Signed)
Refill Request- Pt states he has been calling his pharmacy for 2 weeks and is now out of his medication.  Please call the patient back about the delay.   metFORMIN (GLUCOPHAGE) 500 MG tablet  Take 2 tablets (1,000 mg total) by mouth 2 (two) times daily with a meal., Starting Sun 04/17/2021, Normal  Dispense: 120 tablet  Refills: 1 ordered  Pharmacy: [facility] [id number] - [place], [place] - [address] (Ph: [phone])

## 2021-06-02 ENCOUNTER — Other Ambulatory Visit (HOSPITAL_COMMUNITY): Payer: Self-pay

## 2021-06-02 ENCOUNTER — Telehealth: Payer: Self-pay

## 2021-06-02 DIAGNOSIS — Z720 Tobacco use: Secondary | ICD-10-CM

## 2021-06-02 MED ORDER — VARENICLINE TARTRATE 0.5 MG PO TABS
0.5000 mg | ORAL_TABLET | Freq: Two times a day (BID) | ORAL | 2 refills | Status: DC
  Filled 2021-06-02: qty 30, 15d supply, fill #0

## 2021-06-02 MED ORDER — NICOTINE 21 MG/24HR TD PT24
21.0000 mg | MEDICATED_PATCH | Freq: Every day | TRANSDERMAL | 0 refills | Status: DC
  Filled 2021-06-02: qty 28, 28d supply, fill #0

## 2021-06-02 NOTE — Telephone Encounter (Signed)
Referral intake complete 

## 2021-06-02 NOTE — Assessment & Plan Note (Signed)
8/18: started on [name] and PRN nicotine patches; goal: smoke <1ppd by 8/25

## 2021-06-02 NOTE — Telephone Encounter (Signed)
Patient called back stating he wants the Rx [facility] at [facility] [address], not [facility]. Explained [facility] can call [facility] and ask them to transfer the Rx sent on 7/3 from [facility] to [facility]. He will do that now.

## 2021-06-02 NOTE — Telephone Encounter (Signed)
[name]. is a 49 y.o. male and has been referred to the pharmacist telephone-based smoking cessation service on 05/23/21 by pulmonologist Dr. Annamaria Boots.  -------------------------------------------------------------------------------------------------------------------  Confirmed that patient does not meet any of the following exclusion criteria: Yes  Pregnancy Schizophrenia, bipolar disorder, or major depression Myocardial infarction or coronary artery bypass grafting in the last 2 months Severe or worsening angina  Currently smoking > 10 cigarettes per day? Yes  Willing to quit smoking now or within 30 days? Yes  Interested in using medications to help you quit smoking? Yes   If yes to all above, patient meets criteria for telephone-based service.  -------------------------------------------------------------------------------------------------------------------  Tobacco Use History Current tobacco use: 1 ppd Time to first cigarette: < 30 minutes Started smoking in late 20's  Quit Attempt History  Have you tried to quit in the past? Yes , tried many times Most recent quit attempt: Last month Longest time ever been tobacco free: 2 weeks What helped? Dipping tobacco (takes mind off cigarettes), chewing gum What was difficult? Other friends smoking, mood swings   Tobacco Use Habits: Triggers include psychological: after meals, caffeine, home, watching television emotional: boredom and lonely physical: cigarettes within 30 minutes of a.m.Marland Kitchen Does wake at night to smoke Alcohol use: Rare use Other smokers in household or daily life: house (sister) Identify social support: other sister   On a scale of 1-10, how CONFIDENT are you that you will successfully quit: 10 Barriers/concerns:  feels tired of smoking, tired of stinking.   On a scale of 1-10, how IMPORTANT is it to you that you quit: 10 Motivators: health, money  Past pharmacotherapy trials: (list what  agents and why not effective):  $RemoveBefo'[]'hLALFRVzGUE$  Nicotine gum $RemoveBefo'[]'NuzXCYnqbBD$  Nicotine lozenge $RemoveBeforeDE'[x]'yeVNgLXYRThfsNP$  Nicotine patch, cravings $RemoveBeforeD'[]'lJVeRzaIqOzPiu$  Nicotine inhaler $RemoveBeforeDE'[]'OWwRyocAQGxWKjo$  Nicotine nasal spray $RemoveBef'[]'trNgoulfIf$  Bupropion (Zyban) $RemoveBeforeDEI'[]'PFEKHIfqVYzxDagC$  Varenicline ([name])  Current Outpatient Medications  Medication Instructions   Accu-Chek Softclix Lancets lancets TEST UP TO 4 TIMES A DAY   amphetamine-dextroamphetamine (ADDERALL XR) 30 MG 24 hr capsule 30 mg, Oral, Daily PRN, 1 daily as needed   aspirin 81 mg, Oral, Daily, Swallow whole.   atorvastatin (LIPITOR) 80 mg, Oral, Daily   blood glucose meter kit and supplies KIT Dispense based on patient and insurance preference. Use up to four times daily as directed.   Blood Pressure Monitoring (BLOOD PRESSURE MONITOR/L CUFF) MISC 1 Units, Does not apply, Daily   dapagliflozin propanediol (FARXIGA) 5 mg, Oral, 2 times daily   Eliquis 5 mg, Oral, 2 times daily   empagliflozin (JARDIANCE) 10 mg, Oral, Daily before breakfast   flecainide (TAMBOCOR) 100 mg, Oral, 2 times daily, Please make yearly appt with Dr. Rayann Heman for June 2022 before anymore refills. Thank you 1st attempt   ibuprofen (ADVIL) 400 mg, Oral, Every 6 hours PRN   metFORMIN (GLUCOPHAGE) 1,000 mg, Oral, 2 times daily with meals   metoprolol succinate (TOPROL XL) 100 mg, Oral, Daily, Take with or immediately following a meal.   modafinil (PROVIGIL) 200 mg, Oral, Daily   nicotine (NICODERM CQ - DOSED IN MG/24 HOURS) 21 mg/24hr patch PLACE 1 PATCH (21 MG TOTAL) ONTO THE SKIN DAILY.   Olmesartan-amLODIPine-HCTZ 40-10-25 MG TABS 1 tablet, Oral, Daily, Please restart this medication on 04/18/2021   pantoprazole (PROTONIX) 40 mg, Oral, Daily   Trulicity 1.5 mg, Subcutaneous, Weekly    Assessment/Plan: Patient states interest in quitting smoking. He does not know when he would like to set his quit date. Suggested a SMART goal of smoking <1ppd  by 06/09/2021, patient agreeable. Will discuss setting a quit date at next phone call.   Discussed options for smoking cessation  agents and patient is agreeable to: [x]  Nicotine patch 21 mg [x]  Varenicline ([name])  Treatment was reviewed with the patient including name, instructions, goals of therapy, potential adverse effects including mild itching or redness at the point of application, headache, trouble sleeping, and/or vivid dreams.   Medication counseling:  The following counseling was provided: [x]  Anticipated nicotine withdrawal symptoms [x]  Coping skills/strategies []  Information on 1-800-QUITNOW support program [x]  Tell family and friends about quitting [x]  Stress management [x]  Remove tobacco products (cigarettes, lighters, ash trays)  Patient was advised to contact Pulmonary Clinic if questions/concerns arise. Patient verbalized understanding of information.  Follow up in 1 week(s)  Time spent: 35 minutes  Joseph Art, Pharm.D. PGY-1 Ambulatory Care Resident 06/02/2021 3:22 PM

## 2021-06-03 ENCOUNTER — Other Ambulatory Visit (HOSPITAL_COMMUNITY): Payer: Self-pay

## 2021-06-03 ENCOUNTER — Other Ambulatory Visit: Payer: Self-pay | Admitting: Internal Medicine

## 2021-06-03 DIAGNOSIS — Z720 Tobacco use: Secondary | ICD-10-CM

## 2021-06-04 ENCOUNTER — Other Ambulatory Visit (HOSPITAL_COMMUNITY): Payer: Self-pay

## 2021-06-04 NOTE — Telephone Encounter (Signed)
CY please advise.  Insurance does not cover the chantix but will cover zyban.  Please advise. Thanks

## 2021-06-05 NOTE — Telephone Encounter (Signed)
Need to let his contact at Tobacco cessation program know that [name] they recommended is not covered by his insurance

## 2021-06-06 ENCOUNTER — Telehealth: Payer: Self-pay

## 2021-06-06 ENCOUNTER — Other Ambulatory Visit (HOSPITAL_COMMUNITY): Payer: Self-pay

## 2021-06-06 NOTE — Telephone Encounter (Signed)
Hi [name], contacting you to let you know that [name] is not covered by the patients insurance this is what you guys wanted him on for the smoking cessation program.

## 2021-06-07 ENCOUNTER — Other Ambulatory Visit (HOSPITAL_COMMUNITY): Payer: Self-pay

## 2021-06-07 ENCOUNTER — Other Ambulatory Visit: Payer: Self-pay

## 2021-06-07 ENCOUNTER — Ambulatory Visit: Payer: 59 | Admitting: Physical Therapy

## 2021-06-07 ENCOUNTER — Encounter: Payer: Self-pay | Admitting: Physical Therapy

## 2021-06-07 DIAGNOSIS — R2689 Other abnormalities of gait and mobility: Secondary | ICD-10-CM

## 2021-06-07 DIAGNOSIS — R2681 Unsteadiness on feet: Secondary | ICD-10-CM

## 2021-06-07 NOTE — Patient Instructions (Signed)
You can try a single point cant with an offset handle  You should be able to find this at a pharmacy, [facility] or online.

## 2021-06-07 NOTE — Therapy (Addendum)
[facility] [address] [place], [name], [address] Phone: [phone]   Fax:  [phone]  Physical Therapy Treatment/Discharge Summary  Patient Details  Name: [name]. MRN: [id number] Date of Birth: Aug 10, 1971 Referring Provider (PT): Velna Ochs    PHYSICAL THERAPY DISCHARGE SUMMARY  Visits from Start of Care: 5  Current functional level related to goals / functional outcomes:  PT Long Term Goals - 06/07/21 0823       PT LONG TERM GOAL #1   Title Pt will be independent with final progression of HEP for improved gait and balance.    Baseline HEP was reviewed last visit, and pt reports not performing at home.    Time 4    Period Weeks    Status Not Met      PT LONG TERM GOAL #2   Title Pt will improve FGA score to at least 22/30 for decreased fall risk    Baseline 18/30; 20/30 on 06/07/2021    Time 4    Period Weeks    Status Not Met      PT LONG TERM GOAL #3   Title Pt will improve Berg Balance score to at least 52/56 for decreased fall risk.    Baseline 51/56; 51/56 unchanged 06/07/2021    Time 4    Period Weeks    Status Not Met      PT LONG TERM GOAL #4   Title Pt will improve gait velocity to at least 2 ft/sec for improved gait efficiency and decreased fall risk.    Baseline 1.82 ft/sec; 2.95 ft/sec 06/07/21    Time 4    Period Weeks    Status Achieved      PT LONG TERM GOAL #5   Title Pt will be able to amb at least 1000' on outdoor surfaces for safer community mobility and for performing yard work    Baseline met per report of ambulating 15-20 minutes per day    Time 4    Period Weeks    Status Achieved      PT LONG TERM GOAL #6   Title Pt will have improved FOTO score to 66    Baseline 55; 71% at d/c 06/07/21    Time 4    Period Weeks    Status Achieved            Pt has met 3 of 6 LTGs.   Remaining deficits: Balance, strength, endurance   Education /  Equipment: Educated in [employer] and walking at home, as well as use of cane.   Patient agrees to discharge. Patient goals were partially met. Patient is being discharged due to being pleased with the current functional level.  Mady Haagensen, PT 06/07/21 5:28 PM Phone: [phone] Fax: [phone]   Encounter Date: 06/07/2021   PT End of Session - 06/07/21 1721     Visit Number 5    Number of Visits 5    Date for PT Re-Evaluation 06/08/21    Authorization Type UHC-60 visit limit PT/OT/speech (each visit of each type counts); 40 TOTAL visits of all therapies in 2022    PT Start Time 0810    PT Stop Time 0849    PT Time Calculation (min) 39 min    Equipment Utilized During Treatment Gait belt    Activity Tolerance Patient tolerated treatment well    Behavior During Therapy [facility] for tasks assessed/performed  Past Medical History:  Diagnosis Date   Acute pancreatitis 01/22/2019   Diabetes mellitus    GERD (gastroesophageal reflux disease)    History of alcohol abuse    History of cocaine use    History of echocardiogram    a. Echo 4/14: Moderate LVH, vigorous LVEF, EF 65-70%, normal wall motion, grade 2 diastolic dysfunction, mildly dilated aortic root and ascending aorta, ascending aorta 40 mm, aortic root 38 mm, mild LAE   Hx of cardiovascular stress test    a. GXT 5/14: No ischemic changes  //  b. ETT-Myoview 3/16:  Low risk, no ischemia, EF 58%   Hyperlipidemia    Hypertension    Morbid obesity ([place])    Paroxysmal atrial fibrillation ([place])    Occurring in 2008, with several recurrence since then (including in the setting of + cocaine on UDS).   Sleep apnea    uses CPAP   SVT (supraventricular tachycardia) ([place])    mid to long RP SVT 09/2019    Past Surgical History:  Procedure Laterality Date   APPENDECTOMY     ATRIAL FIBRILLATION ABLATION N/A 11/27/2019   Procedure: ATRIAL FIBRILLATION ABLATION;  Surgeon: Thompson Grayer, MD;  Location: [place] [facility];   Service: Cardiovascular;  Laterality: N/A;   BUBBLE STUDY  05/20/2021   Procedure: BUBBLE STUDY;  Surgeon: Donato Heinz, MD;  Location: [facility];  Service: Cardiovascular;;   ROTATOR CUFF REPAIR     SVT ABLATION N/A 11/27/2019   Procedure: SVT ABLATION;  Surgeon: Thompson Grayer, MD;  Location: [place] [facility];  Service: Cardiovascular;  Laterality: N/A;   TEE WITHOUT CARDIOVERSION N/A 05/20/2021   Procedure: TRANSESOPHAGEAL ECHOCARDIOGRAM (TEE);  Surgeon: Donato Heinz, MD;  Location: [facility] ENDOSCOPY;  Service: Cardiovascular;  Laterality: N/A;   TONSILLECTOMY      There were no vitals filed for this visit.   Subjective Assessment - 06/07/21 0811     Subjective Walking about 15 minutes everyday.  Not doing the exercises, thought the walking took the place of the exercises.  No pain, no falls.    Pertinent History Recent hospitalization 2/4-11/21/20 (Dr. Rosalin Hawking and RN at d/c recommend OP PT)    Patient Stated Goals Pt's goals for therapy are to get back to where I was with strength and coordination.  11/22/20:  To get back where I was before this stroke.    Currently in Pain? No/denies                [facility] PT Assessment - 06/07/21 0001       Functional Gait  Assessment   Gait assessed  Yes    Gait Level Surface Walks 20 ft, slow speed, abnormal gait pattern, evidence for imbalance or deviates 10-15 in outside of the 12 in walkway width. Requires more than 7 sec to ambulate 20 ft.   7.78   Change in Gait Speed Able to smoothly change walking speed without loss of balance or gait deviation. Deviate no more than 6 in outside of the 12 in walkway width.    Gait with Horizontal Head Turns Performs head turns smoothly with slight change in gait velocity (eg, minor disruption to smooth gait path), deviates 6-10 in outside 12 in walkway width, or uses an assistive device.   9.1   Gait with Vertical Head Turns Performs head turns with no change in gait. Deviates no more  than 6 in outside 12 in walkway width.    Gait and Pivot Turn  Pivot turns safely within 3 sec and stops quickly with no loss of balance.    Step Over Obstacle Is able to step over one shoe box (4.5 in total height) without changing gait speed. No evidence of imbalance.    Gait with Narrow Base of Support Ambulates 4-7 steps.    Gait with Eyes Closed Walks 20 ft, slow speed, abnormal gait pattern, evidence for imbalance, deviates 10-15 in outside 12 in walkway width. Requires more than 9 sec to ambulate 20 ft.    Ambulating Backwards Walks 20 ft, uses assistive device, slower speed, mild gait deviations, deviates 6-10 in outside 12 in walkway width.    Steps Alternating feet, must use rail.    Total Score 20    FGA comment: improved from 18/30                           [facility] Adult PT Treatment/Exercise - 06/07/21 0001       Ambulation/Gait   Ambulation/Gait Yes    Ambulation/Gait Assistance 5: Supervision;4: Min assist    Ambulation/Gait Assistance Details occasional hand over hand assist for cane placement    Ambulation Distance (Feet) 345 Feet   200   Assistive device Straight cane    Gait Pattern Step-through pattern;Abducted- right;Right foot flat;Decreased step length - left    Ambulation Surface Level;Indoor    Gait velocity 2.95 ft/sec    Gait Comments PT provides verbal cues and assistance for cane sequence.  Pt requires cues to slow pace to better coordinate.  He gets easily frustrated and stops often.  PT cues pt to relax, just walk his normal pattern and use the cane as he can to balance; he does better with this more relaxed pattern.  Discussed how to obtain cane.      Berg Balance Test   Sit to Stand Able to stand without using hands and stabilize independently    Standing Unsupported Able to stand safely 2 minutes    Sitting with Back Unsupported but Feet Supported on Floor or Stool Able to sit safely and securely 2 minutes    Stand to Sit Sits safely with  minimal use of hands    Transfers Able to transfer safely, minor use of hands    Standing Unsupported with Eyes Closed Able to stand 10 seconds safely    Standing Ubsupported with Feet Together Able to place feet together independently and stand 1 minute safely    From Standing, Reach Forward with Outstretched Arm Can reach confidently >25 cm (10")    From Standing Position, Pick up Object from Floor Able to pick up shoe safely and easily    From Standing Position, Turn to Look Behind Over each Shoulder Looks behind from both sides and weight shifts well    Turn 360 Degrees Able to turn 360 degrees safely but slowly    Standing Unsupported, Alternately Place Feet on Step/Stool Able to stand independently and safely and complete 8 steps in 20 seconds    Standing Unsupported, One Foot in Front Able to plae foot ahead of the other independently and hold 30 seconds    Standing on One Leg Able to lift leg independently and hold equal to or more than 3 seconds    Total Score 51                 Educated pt in progress towards goals along with plans for d/c.   PT Education -  06/07/21 1720     Education Details Cane instructions/what type to obtain; plans for d/c this visit.    Person(s) Educated Patient    Methods Explanation    Comprehension Verbalized understanding              PT Short Term Goals - 04/25/21 0938       PT SHORT TERM GOAL #1   Title STGs = LTGs      PT SHORT TERM GOAL #4   Status --      PT SHORT TERM GOAL #5   Status --               PT Long Term Goals - 06/07/21 9179       PT LONG TERM GOAL #1   Title Pt will be independent with final progression of HEP for improved gait and balance.    Baseline HEP was reviewed last visit, and pt reports not performing at home.    Time 4    Period Weeks    Status Not Met      PT LONG TERM GOAL #2   Title Pt will improve FGA score to at least 22/30 for decreased fall risk    Baseline 18/30; 20/30 on  06/07/2021    Time 4    Period Weeks    Status Not Met      PT LONG TERM GOAL #3   Title Pt will improve Berg Balance score to at least 52/56 for decreased fall risk.    Baseline 51/56; 51/56 unchanged 06/07/2021    Time 4    Period Weeks    Status Not Met      PT LONG TERM GOAL #4   Title Pt will improve gait velocity to at least 2 ft/sec for improved gait efficiency and decreased fall risk.    Baseline 1.82 ft/sec; 2.95 ft/sec 06/07/21    Time 4    Period Weeks    Status Achieved      PT LONG TERM GOAL #5   Title Pt will be able to amb at least 1000' on outdoor surfaces for safer community mobility and for performing yard work    Baseline met per report of ambulating 15-20 minutes per day    Time 4    Period Weeks    Status Achieved      PT LONG TERM GOAL #6   Title Pt will have improved FOTO score to 66    Baseline 55; 71% at d/c 06/07/21    Time 4    Period Weeks    Status Achieved                   Plan - 06/07/21 1721     Clinical Impression Statement Assessed LTGs this visit, with pt meeting 3 of 6 LTGs.  LTG 1 not met for HEP (pt not performing consistently at home).  LTG 2 not met, but FGA score improved from 18/30 to 20/30, just not to goal level.  LTG 3 not met, with Berg score unchanged at 51/56.  LTG 4 met for gait velocity, LTG 5 met for outdoor gait, LTG 6 met for improved FOTO score.  Gait trianing with cane today, with improvement over last visit, feel that [facility] is appropriate for use on outdoor surfaces and pt will likely improve with practice.  He is appropriate for d/c from PT at this time.    Personal Factors and Comorbidities Comorbidity 3+    Comorbidities diabetes,  HTN, morbid obesity, history of polysubstance abuse (cocaine and alcohol)    Examination-Activity Limitations Locomotion Level;Transfers;Stairs    Examination-Participation Restrictions Community Activity;Yard Work    Stability/Clinical Decision Making Evolving/Moderate complexity     Rehab Potential Good    PT Frequency 2x / week    PT Duration 8 weeks   including week of recert, 11/18/3610   PT Treatment/Interventions ADLs/Self Care Home Management;Gait training;Stair training;Functional mobility training;Therapeutic activities;Therapeutic exercise;Balance training;Neuromuscular re-education;Patient/family education;DME Instruction    PT Next Visit Plan D/C this visit    PT Home Exercise Plan Access Code: [email or web address]    [email or web address] and Agree with Plan of Care Patient             Patient will benefit from skilled therapeutic intervention in order to improve the following deficits and impairments:  Abnormal gait, Difficulty walking, Decreased balance, Decreased mobility  Visit Diagnosis: Unsteadiness on feet  Other abnormalities of gait and mobility     Problem List Patient Active Problem List   Diagnosis Date Noted   History of CVA (cerebrovascular accident) 04/14/2021   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 ([place]) 03/15/2021   Hypertensive retinopathy of both eyes, grade 2 03/15/2021   Healthcare maintenance 02/23/2021   Right sided weakness 11/19/2020   Cerebrovascular accident (CVA) ([place]) 06/01/2020   Mediastinal adenopathy 01/06/2020   Morbid obesity due to excess calories ([place]) 12/08/2016   Paroxysmal atrial fibrillation ([place]) 01/23/2013   Diabetes mellitus ([place]) 01/23/2013   Hypertension 01/23/2013   Tobacco user 01/23/2013   Chronic diastolic heart failure ([place]) 01/21/2013   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 06/07/2021, 5:25 PM Frazier Butt., PT   [facility] [address] [place], [name], [address] Phone: [phone]   Fax:  [phone]  Name: [name]. MRN:  [id number] Date of Birth: 08/09/1971

## 2021-06-09 ENCOUNTER — Other Ambulatory Visit (HOSPITAL_COMMUNITY): Payer: Self-pay

## 2021-06-10 ENCOUNTER — Other Ambulatory Visit: Payer: Self-pay | Admitting: Internal Medicine

## 2021-06-10 ENCOUNTER — Other Ambulatory Visit (HOSPITAL_COMMUNITY): Payer: Self-pay

## 2021-06-10 DIAGNOSIS — Z720 Tobacco use: Secondary | ICD-10-CM

## 2021-06-11 ENCOUNTER — Other Ambulatory Visit (HOSPITAL_COMMUNITY): Payer: Self-pay

## 2021-06-14 ENCOUNTER — Telehealth: Payer: Self-pay

## 2021-06-15 ENCOUNTER — Telehealth: Payer: Self-pay

## 2021-06-15 DIAGNOSIS — E1165 Type 2 diabetes mellitus with hyperglycemia: Secondary | ICD-10-CM

## 2021-06-15 DIAGNOSIS — I119 Hypertensive heart disease without heart failure: Secondary | ICD-10-CM

## 2021-06-15 NOTE — Telephone Encounter (Signed)
Pt is requesting a call back he is out of one of his medicine and he is not sure if he to continue the medicine or if it was a one time thing    Medication is :dapagliflozin propanediol (FARXIGA) 5 MG TABS tablet

## 2021-06-15 NOTE — Telephone Encounter (Signed)
Return pt's call; no answer - left message to call the office. 

## 2021-06-15 NOTE — Telephone Encounter (Signed)
Pt states he's been out of farxiga for about a mouth.  Does not check glucoses, so he doesn't know what his numbers have been. Spoke with pt about the importance of taking meds and checking glucoses as directed.  Will send refill request to pcp for review.Kingsley Spittle Cassady8/31/20224:03 PM

## 2021-06-17 MED ORDER — EMPAGLIFLOZIN 10 MG PO TABS: 10.0000 mg | ORAL_TABLET | Freq: Every day | ORAL | 3 refills | Status: DC

## 2021-06-17 NOTE — Telephone Encounter (Signed)
Pt calling back to check on the status of refill request Will send request to attending to help expedite request

## 2021-06-17 NOTE — Telephone Encounter (Signed)
Patient called in stating pharmacy told him that his insurance will not pay for Marcelline Deist but will pay for Jardiance. Please advise.

## 2021-06-17 NOTE — Telephone Encounter (Signed)
Change to 10mg  jardiance appropriate, review of chart appears that this was done or attempted in July by Dr August.  STOP [name], Start Jardiance 10mg  daily

## 2021-06-21 NOTE — Telephone Encounter (Signed)
Hi Melissa! I will give the patient a call back as soon as possible. Thanks!  Valeda Malm, Pharm.D. PGY-1 Ambulatory Care Pharmacy Resident 06/21/2021 8:16 PM

## 2021-06-22 ENCOUNTER — Encounter: Payer: Self-pay | Admitting: Gastroenterology

## 2021-06-22 ENCOUNTER — Ambulatory Visit (INDEPENDENT_AMBULATORY_CARE_PROVIDER_SITE_OTHER): Payer: 59 | Admitting: Gastroenterology

## 2021-06-22 VITALS — BP 100/68 | HR 69 | Ht 68.25 in | Wt 306.0 lb

## 2021-06-22 DIAGNOSIS — Z1211 Encounter for screening for malignant neoplasm of colon: Secondary | ICD-10-CM | POA: Diagnosis not present

## 2021-06-22 DIAGNOSIS — Z1212 Encounter for screening for malignant neoplasm of rectum: Secondary | ICD-10-CM | POA: Diagnosis not present

## 2021-06-22 DIAGNOSIS — Z7901 Long term (current) use of anticoagulants: Secondary | ICD-10-CM

## 2021-06-22 DIAGNOSIS — Z8673 Personal history of transient ischemic attack (TIA), and cerebral infarction without residual deficits: Secondary | ICD-10-CM | POA: Diagnosis not present

## 2021-06-22 NOTE — Progress Notes (Signed)
History of Present Illness: This is a 50 year old male referred by Evlyn Kanner, MD for the evaluation of CRC screening, average risk.  He relates occasional problems with mild constipation related to certain foods.  He takes a stool softener as needed which alleviates his constipation.  He has had multiple CVAs most recently on April 14, 2021.  Colonoscopy to the cecum with a good bowel prep performed in January 2010 only showed internal hemorrhoids. Denies weight loss, abdominal pain, diarrhea, change in stool caliber, melena, hematochezia, nausea, vomiting, dysphagia, reflux symptoms, chest pain.   Allergies  Allergen Reactions   Shrimp [Shellfish Allergy] Anaphylaxis   Banana Other (See Comments)    Pt gags   Watermelon Flavor Nausea And Vomiting   Almond Oil Itching    Roof of mouth itches   Other Itching    Grapes cause itching   Peanut-Containing Drug Products Itching and Cough   Sulfa Antibiotics Itching   Outpatient Medications Prior to Visit  Medication Sig Dispense Refill   Accu-Chek Softclix Lancets lancets TEST UP TO 4 TIMES A DAY 100 each 2   amphetamine-dextroamphetamine (ADDERALL XR) 30 MG 24 hr capsule Take 1 capsule (30 mg total) by mouth daily as needed (focus). 1 daily as needed 30 capsule 0   apixaban (ELIQUIS) 5 MG TABS tablet Take 1 tablet (5 mg total) by mouth 2 (two) times daily. (Patient taking differently: Take 5 mg by mouth 2 (two) times daily.) 90 tablet 3   aspirin EC 81 MG EC tablet Take 1 tablet (81 mg total) by mouth daily. Swallow whole. 30 tablet 11   atorvastatin (LIPITOR) 80 MG tablet Take 1 tablet (80 mg total) by mouth daily. 90 tablet 3   blood glucose meter kit and supplies KIT Dispense based on patient and insurance preference. Use up to four times daily as directed. 1 each 0   Blood Pressure Monitoring (BLOOD PRESSURE MONITOR/L CUFF) MISC 1 Units by Does not apply route daily. 1 each 0   Dulaglutide (TRULICITY) 1.5 MG/0.5ML SOPN Inject 1.5  mg into the skin once a week. 0.5 mL 1   empagliflozin (JARDIANCE) 10 MG TABS tablet Take 1 tablet (10 mg total) by mouth daily before breakfast. 90 tablet 3   flecainide (TAMBOCOR) 100 MG tablet TAKE 1 TABLET (100 MG TOTAL) BY MOUTH 2 (TWO) TIMES DAILY. PLEASE MAKE YEARLY APPT WITH DR. ALLRED FOR JUNE 2022 BEFORE ANYMORE REFILLS. THANK YOU 1ST ATTEMPT (Patient taking differently: Take 100 mg by mouth 2 (two) times daily.) 60 tablet 1   ibuprofen (ADVIL) 200 MG tablet Take 400 mg by mouth every 6 (six) hours as needed for moderate pain or mild pain.     metFORMIN (GLUCOPHAGE) 500 MG tablet Take 2 tablets (1,000 mg total) by mouth 2 (two) times daily with a meal. 120 tablet 1   metoprolol succinate (TOPROL XL) 100 MG 24 hr tablet Take 1 tablet (100 mg total) by mouth daily. Take with or immediately following a meal. 30 tablet 11   modafinil (PROVIGIL) 200 MG tablet Take 1 tablet (200 mg total) by mouth daily. 30 tablet 0   nicotine (NICODERM CQ - DOSED IN MG/24 HOURS) 21 mg/24hr patch PLACE 1 PATCH (21 MG TOTAL) ONTO THE SKIN DAILY. 28 patch 1   nicotine (NICODERM CQ) 21 mg/24hr patch Place 1 patch (21 mg total) onto the skin daily. 28 patch 0   Olmesartan-amLODIPine-HCTZ 40-10-25 MG TABS Take 1 tablet by mouth daily. Please restart this  medication on 04/18/2021 90 tablet 3   pantoprazole (PROTONIX) 40 MG tablet Take 1 tablet (40 mg total) by mouth daily. 30 tablet 5   No facility-administered medications prior to visit.   Past Medical History:  Diagnosis Date   Acute pancreatitis 01/22/2019   Diabetes mellitus    GERD (gastroesophageal reflux disease)    History of alcohol abuse    History of cocaine use    History of echocardiogram    a. Echo 4/14: Moderate LVH, vigorous LVEF, EF 65-70%, normal wall motion, grade 2 diastolic dysfunction, mildly dilated aortic root and ascending aorta, ascending aorta 40 mm, aortic root 38 mm, mild LAE   Hx of cardiovascular stress test    a. GXT 5/14: No  ischemic changes  //  b. ETT-Myoview 3/16:  Low risk, no ischemia, EF 58%   Hyperlipidemia    Hypertension    Morbid obesity ([place])    Paroxysmal atrial fibrillation ([place])    Occurring in 2008, with several recurrence since then (including in the setting of + cocaine on UDS).   Sleep apnea    uses CPAP   Stroke ([place])    x3   SVT (supraventricular tachycardia) ([place])    mid to long RP SVT 09/2019   Past Surgical History:  Procedure Laterality Date   APPENDECTOMY     ATRIAL FIBRILLATION ABLATION N/A 11/27/2019   Procedure: ATRIAL FIBRILLATION ABLATION;  Surgeon: Thompson Grayer, MD;  Location: [place] [facility];  Service: Cardiovascular;  Laterality: N/A;   BUBBLE STUDY  05/20/2021   Procedure: BUBBLE STUDY;  Surgeon: Donato Heinz, MD;  Location: [place];  Service: Cardiovascular;;   ROTATOR CUFF REPAIR     SVT ABLATION N/A 11/27/2019   Procedure: SVT ABLATION;  Surgeon: Thompson Grayer, MD;  Location: [place] [facility];  Service: Cardiovascular;  Laterality: N/A;   TEE WITHOUT CARDIOVERSION N/A 05/20/2021   Procedure: TRANSESOPHAGEAL ECHOCARDIOGRAM (TEE);  Surgeon: Donato Heinz, MD;  Location: [facility] ENDOSCOPY;  Service: Cardiovascular;  Laterality: N/A;   TONSILLECTOMY     Social History   Socioeconomic History   Marital status: Single    Spouse name: Not on file   Number of children: 0   Years of education: 12   Highest education level: Not on file  Occupational History   Occupation: Unemployed    Employer: UNEMPLOYED  Tobacco Use   Smoking status: Some Days    Packs/day: 0.10    Years: 28.00    Pack years: 2.80    Types: Cigarettes   Smokeless tobacco: Former    Types: Snuff   Tobacco comments:    1 pack a day 05/23/2021  Vaping Use   Vaping Use: Never used  Substance and Sexual Activity   Alcohol use: Yes    Alcohol/week: 6.0 standard drinks    Types: 6 Standard drinks or equivalent per week    Comment: rarely   Drug use: Not Currently    Comment:  cocaine in the past, none currently   Sexual activity: Not on file  Other Topics Concern   Not on file  Social History Narrative   Fun: Designer, fashion/clothing       Lives in [place] with sister.      Unemployed, was working a Land job   Social Determinants of Radio broadcast assistant Strain: Not on file  Food Insecurity: Not on file  Transportation Needs: Not on file  Physical Activity: Not on file  Stress: Not on file  Social Connections: Not on file   Family History  Problem Relation Age of Onset   Diabetes Mother    Heart attack Father 15   Stroke Maternal Grandmother    Stroke Paternal Grandmother    Diabetes Paternal Grandfather        Review of Systems: Pertinent positive and negative review of systems were noted in the above HPI section. All other review of systems were otherwise negative.   Physical Exam: General: Well developed, well nourished, no acute distress Head: Normocephalic and atraumatic Eyes: Sclerae anicteric, EOMI Ears: Normal auditory acuity Mouth: Not examined, mask on during Covid-19 pandemic Neck: Supple, no masses or thyromegaly Lungs: Clear throughout to auscultation Heart: Regular rate and rhythm; no murmurs, rubs or bruits Abdomen: Soft, non tender and non distended. No masses, hepatosplenomegaly or hernias noted. Normal Bowel sounds Rectal: Deferred to colonoscopy  Musculoskeletal: Symmetrical with no gross deformities  Skin: No lesions on visible extremities Pulses:  Normal pulses noted Extremities: No clubbing, cyanosis, edema or deformities noted Neurological: Alert oriented x 4, grossly nonfocal Cervical Nodes:  No significant cervical adenopathy Inguinal Nodes: No significant inguinal adenopathy Psychological:  Alert and cooperative. Normal mood and affect   Assessment and Recommendations:  CRC screening, average risk. He is a high risk for a CVA with colonoscopy off Eliquis so it is in his best interest to avoid colonoscopy  with a hold of anticoagulation.  Recommending Cologuard for CRC screening and the patient agrees this is his best and safest option.  We will arrange this Cologuard.  If negative recommend he have future CRC screening with Cologuard through his PCPs office. History of multiple CVA most recently on April 14, 2021. Afib on Eliquis.  Intermittent mild constipation.  Avoid foods that cause symptoms.  Increase daily water intake.  Colace once daily as needed.   cc: [name], MD [address] [place],  [place] [address]

## 2021-06-22 NOTE — Telephone Encounter (Signed)
[name] Kyllian Clingerman. is a 50 y.o. male and was referred to the pharmacist telephone-based smoking cessation service on 05/23/2021 by pulmonologist Dr. Annamaria Boots.  At last telephone visit on 06/02/2021  Tobacco Use History Current tobacco use: 1-2 cigarettes/day, [place] Baseline tobacco use: 1 ppd Time to first cigarette: < 30 minutes Started smoking in late 20's   Quit Attempt History  Have you tried to quit in the past? Yes , tried many times Most recent quit attempt: Last month Longest time ever been tobacco free: 2 weeks What helped? Dipping tobacco (takes mind off cigarettes), chewing gum What was difficult? Other friends smoking, mood swings    Tobacco Use Habits: Triggers include psychological: after meals, caffeine, home, watching television emotional: boredom and lonely physical: cigarettes within 30 minutes of a.m.Marland Kitchen Does wake at night to smoke Alcohol use: Rare use Other smokers in household or daily life: house (sister) Identify social support: other sister    On a scale of 1-10, how CONFIDENT are you that you will successfully quit: 10 Barriers/concerns:  feels tired of smoking, tired of stinking.    On a scale of 1-10, how IMPORTANT is it to you that you quit: 10 Motivators: health, money   Past pharmacotherapy trials: (list what agents and why not effective):  _0  Nicotine gum _1  Nicotine lozenge _2  Nicotine patch, cravings _3  Nicotine inhaler _4  Nicotine nasal spray _5  Bupropion (Zyban) _6  Varenicline ([name])  Current Outpatient Medications  Medication Instructions   Accu-Chek Softclix Lancets lancets TEST UP TO 4 TIMES A DAY   amphetamine-dextroamphetamine (ADDERALL XR) 30 MG 24 hr capsule 30 mg, Oral, Daily PRN, 1 daily as needed   aspirin 81 mg, Oral, Daily, Swallow whole.   atorvastatin (LIPITOR) 80 mg, Oral, Daily   blood glucose meter kit and supplies KIT Dispense based on patient and insurance preference. Use up to four times daily as  directed.   Blood Pressure Monitoring (BLOOD PRESSURE MONITOR/L CUFF) MISC 1 Units, Does not apply, Daily   Eliquis 5 mg, Oral, 2 times daily   empagliflozin (JARDIANCE) 10 mg, Oral, Daily before breakfast   flecainide (TAMBOCOR) 100 mg, Oral, 2 times daily, Please make yearly appt with Dr. Rayann Heman for June 2022 before anymore refills. Thank you 1st attempt   ibuprofen (ADVIL) 400 mg, Oral, Every 6 hours PRN   metFORMIN (GLUCOPHAGE) 1,000 mg, Oral, 2 times daily with meals   metoprolol succinate (TOPROL XL) 100 mg, Oral, Daily, Take with or immediately following a meal.   modafinil (PROVIGIL) 200 mg, Oral, Daily   nicotine (NICODERM CQ - DOSED IN MG/24 HOURS) 21 mg/24hr patch PLACE 1 PATCH (21 MG TOTAL) ONTO THE SKIN DAILY.   nicotine (NICODERM CQ) 21 mg, Transdermal, Daily   Olmesartan-amLODIPine-HCTZ 40-10-25 MG TABS 1 tablet, Oral, Daily, Please restart this medication on 04/18/2021   pantoprazole (PROTONIX) 40 mg, Oral, Daily   Trulicity 1.5 mg, Subcutaneous, Weekly   varenicline ([name]) 0.5 MG tablet Take 1 tablet by mouth once daily x3 days then 1 tablet twice daily thereafter    Assessment/Plan: Patient has decreased tobacco use from prior, congratulated on progress. Currently using 21 mg nicotine patch for smoking cessation pharmacotherapy. His insurance did not cover varenicline ([name]). Patient also reports he has an additional supply of nicotine patches at home that he can use from a previous quit attempt.   The only issue patient reports with the patches is that they fall off within ~10 mins of placement due to perspiration. Educated patient to  dry skin before applying and rotate sites to see which areas stick best and have the least amount of perspiration. Denies any adverse effects of Nicoderm. Discussed triggers, barriers, and motivators to quitting.   Patient notes that there are some days that he does not smoke at all. He reports he has not bought any cigarettes for himself in  the past week but he has smoked cigarettes of those around him. Will discuss decreasing the nicotine patch dose and setting a quit date during his next follow-up phone call.   Patient prescribed: _0  Nicotine patch 21 mg  Treatment was reviewed with the patient including name, instructions, goals of therapy, potential adverse effects including mild itching or redness at the point of application, headache, trouble sleeping, and/or vivid dreams.   The following counseling was provided: _1  Anticipated nicotine withdrawal symptoms _2  Coping skills/strategies _3  Information on 1-800-QUITNOW support program _4  Tell family and friends about quitting _5  Stress management _6  Remove tobacco products (cigarettes, lighters, ash trays) by next phone call.   Patient was advised to contact Pulmonary Clinic if questions/concerns arise. Patient verbalized understanding of information.  Follow up in 3-4 weeks.   Time spent: 20 minutes Joseph Art, Pharm.D. PGY-1 Ambulatory Care Resident 06/22/2021 12:43 PM

## 2021-06-22 NOTE — Patient Instructions (Signed)
Your provider has ordered Cologuard testing as an option for colon cancer screening. This is performed by [employer] and may be out of network with your insurance. PRIOR to completing the test, it is YOUR responsibility to contact your insurance about covered benefits for this test. Your out of pocket expense could be anywhere from $0.00 to $649.00.   When you call to check coverage with your insurer, please provide the following information:   -The ONLY provider of Cologuard is Opt[facility], dispensing  - CPT code for Cologuard is [phone].  [facility] NPI # [id number]  -[employer] Tax ID # [id number]   We have already sent your demographic and insurance information to [employer] (phone number [phone]) and they should contact you within the next week regarding your test. If you have not heard from them within the next week, please call our office at [phone].  The [facility] providers would like to encourage you to use [facility] to communicate with providers for non-urgent requests or questions.  Due to long hold times on the telephone, sending your provider a message by [facility] may be a faster and more efficient way to get a response.  Please allow 48 business hours for a response.  Please remember that this is for non-urgent requests.   Due to recent changes in healthcare laws, you may see the results of your imaging and laboratory studies on MyChart before your provider has had a chance to review them.  We understand that in some cases there may be results that are confusing or concerning to you. Not all laboratory results come back in the same time frame and the provider may be waiting for multiple results in order to interpret others.  Please give [place] 48 hours in order for your provider to thoroughly review all the results before contacting the office for clarification of your results.   Normal BMI (Body Mass Index- based on height and weight) is  between 19 and 25. Your BMI today is Body mass index is 46.19 kg/m. Marland Kitchen Please consider follow up  regarding your BMI with your Primary Care Provider.   Thank you for choosing me and [facility].  Venita Lick. Pleas Koch., MD., Clementeen Graham

## 2021-06-28 ENCOUNTER — Encounter: Payer: 59 | Admitting: Internal Medicine

## 2021-07-04 ENCOUNTER — Emergency Department (HOSPITAL_COMMUNITY): Payer: 59

## 2021-07-04 ENCOUNTER — Emergency Department (HOSPITAL_COMMUNITY)
Admission: EM | Admit: 2021-07-04 | Discharge: 2021-07-04 | Disposition: A | Discharge status: Home / Self Care | Payer: 59 | Attending: Emergency Medicine | Admitting: Emergency Medicine

## 2021-07-04 ENCOUNTER — Other Ambulatory Visit: Payer: Self-pay

## 2021-07-04 ENCOUNTER — Telehealth: Payer: Self-pay | Admitting: Internal Medicine

## 2021-07-04 DIAGNOSIS — Z5321 Procedure and treatment not carried out due to patient leaving prior to being seen by health care provider: Secondary | ICD-10-CM | POA: Insufficient documentation

## 2021-07-04 DIAGNOSIS — X500XXA Overexertion from strenuous movement or load, initial encounter: Secondary | ICD-10-CM | POA: Diagnosis not present

## 2021-07-04 DIAGNOSIS — R42 Dizziness and giddiness: Secondary | ICD-10-CM | POA: Diagnosis present

## 2021-07-04 LAB — CBC WITH DIFFERENTIAL/PLATELET
Abs Immature Granulocytes: 0.02 10*3/uL (ref 0.00–0.07)
Basophils Absolute: 0.1 10*3/uL (ref 0.0–0.1)
Basophils Relative: 1 %
Eosinophils Absolute: 0.6 10*3/uL — ABNORMAL HIGH (ref 0.0–0.5)
Eosinophils Relative: 7 %
HCT: 37.2 % — ABNORMAL LOW (ref 39.0–52.0)
Hemoglobin: 12 g/dL — ABNORMAL LOW (ref 13.0–17.0)
Immature Granulocytes: 0 %
Lymphocytes Relative: 20 %
Lymphs Abs: 1.6 10*3/uL (ref 0.7–4.0)
MCH: 27.8 pg (ref 26.0–34.0)
MCHC: 32.3 g/dL (ref 30.0–36.0)
MCV: 86.1 fL (ref 80.0–100.0)
Monocytes Absolute: 0.8 10*3/uL (ref 0.1–1.0)
Monocytes Relative: 10 %
Neutro Abs: 4.7 10*3/uL (ref 1.7–7.7)
Neutrophils Relative %: 62 %
Platelets: 338 10*3/uL (ref 150–400)
RBC: 4.32 MIL/uL (ref 4.22–5.81)
RDW: 13.1 % (ref 11.5–15.5)
WBC: 7.6 10*3/uL (ref 4.0–10.5)
nRBC: 0 % (ref 0.0–0.2)

## 2021-07-04 LAB — COMPREHENSIVE METABOLIC PANEL
ALT: 27 U/L (ref 0–44)
AST: 21 U/L (ref 15–41)
Albumin: 3.5 g/dL (ref 3.5–5.0)
Alkaline Phosphatase: 73 U/L (ref 38–126)
Anion gap: 9 (ref 5–15)
BUN: 51 mg/dL — ABNORMAL HIGH (ref 6–20)
CO2: 19 mmol/L — ABNORMAL LOW (ref 22–32)
Calcium: 8.7 mg/dL — ABNORMAL LOW (ref 8.9–10.3)
Chloride: 107 mmol/L (ref 98–111)
Creatinine, Ser: 2.48 mg/dL — ABNORMAL HIGH (ref 0.61–1.24)
GFR, Estimated: 31 mL/min — ABNORMAL LOW (ref >60)
Glucose, Bld: 290 mg/dL — ABNORMAL HIGH (ref 70–99)
Potassium: 5.6 mmol/L — ABNORMAL HIGH (ref 3.5–5.1)
Sodium: 135 mmol/L (ref 135–145)
Total Bilirubin: 0.5 mg/dL (ref 0.3–1.2)
Total Protein: 7.3 g/dL (ref 6.5–8.1)

## 2021-07-04 LAB — TROPONIN I (HIGH SENSITIVITY)
Troponin I (High Sensitivity): 10 ng/L (ref <18)
Troponin I (High Sensitivity): 10 ng/L (ref <18)

## 2021-07-04 LAB — ETHANOL: Alcohol, Ethyl (B): <10 mg/dL (ref <10)

## 2021-07-04 LAB — BRAIN NATRIURETIC PEPTIDE: B Natriuretic Peptide: 42.9 pg/mL (ref 0.0–100.0)

## 2021-07-04 IMAGING — DX DG CHEST 2V
2 series · 2 of 2 positions shown · non-contrast
Comparison: Chest radiograph [DATE]

CLINICAL DATA: Dizziness

EXAM:
CHEST - 2 VIEW

[chest pa]
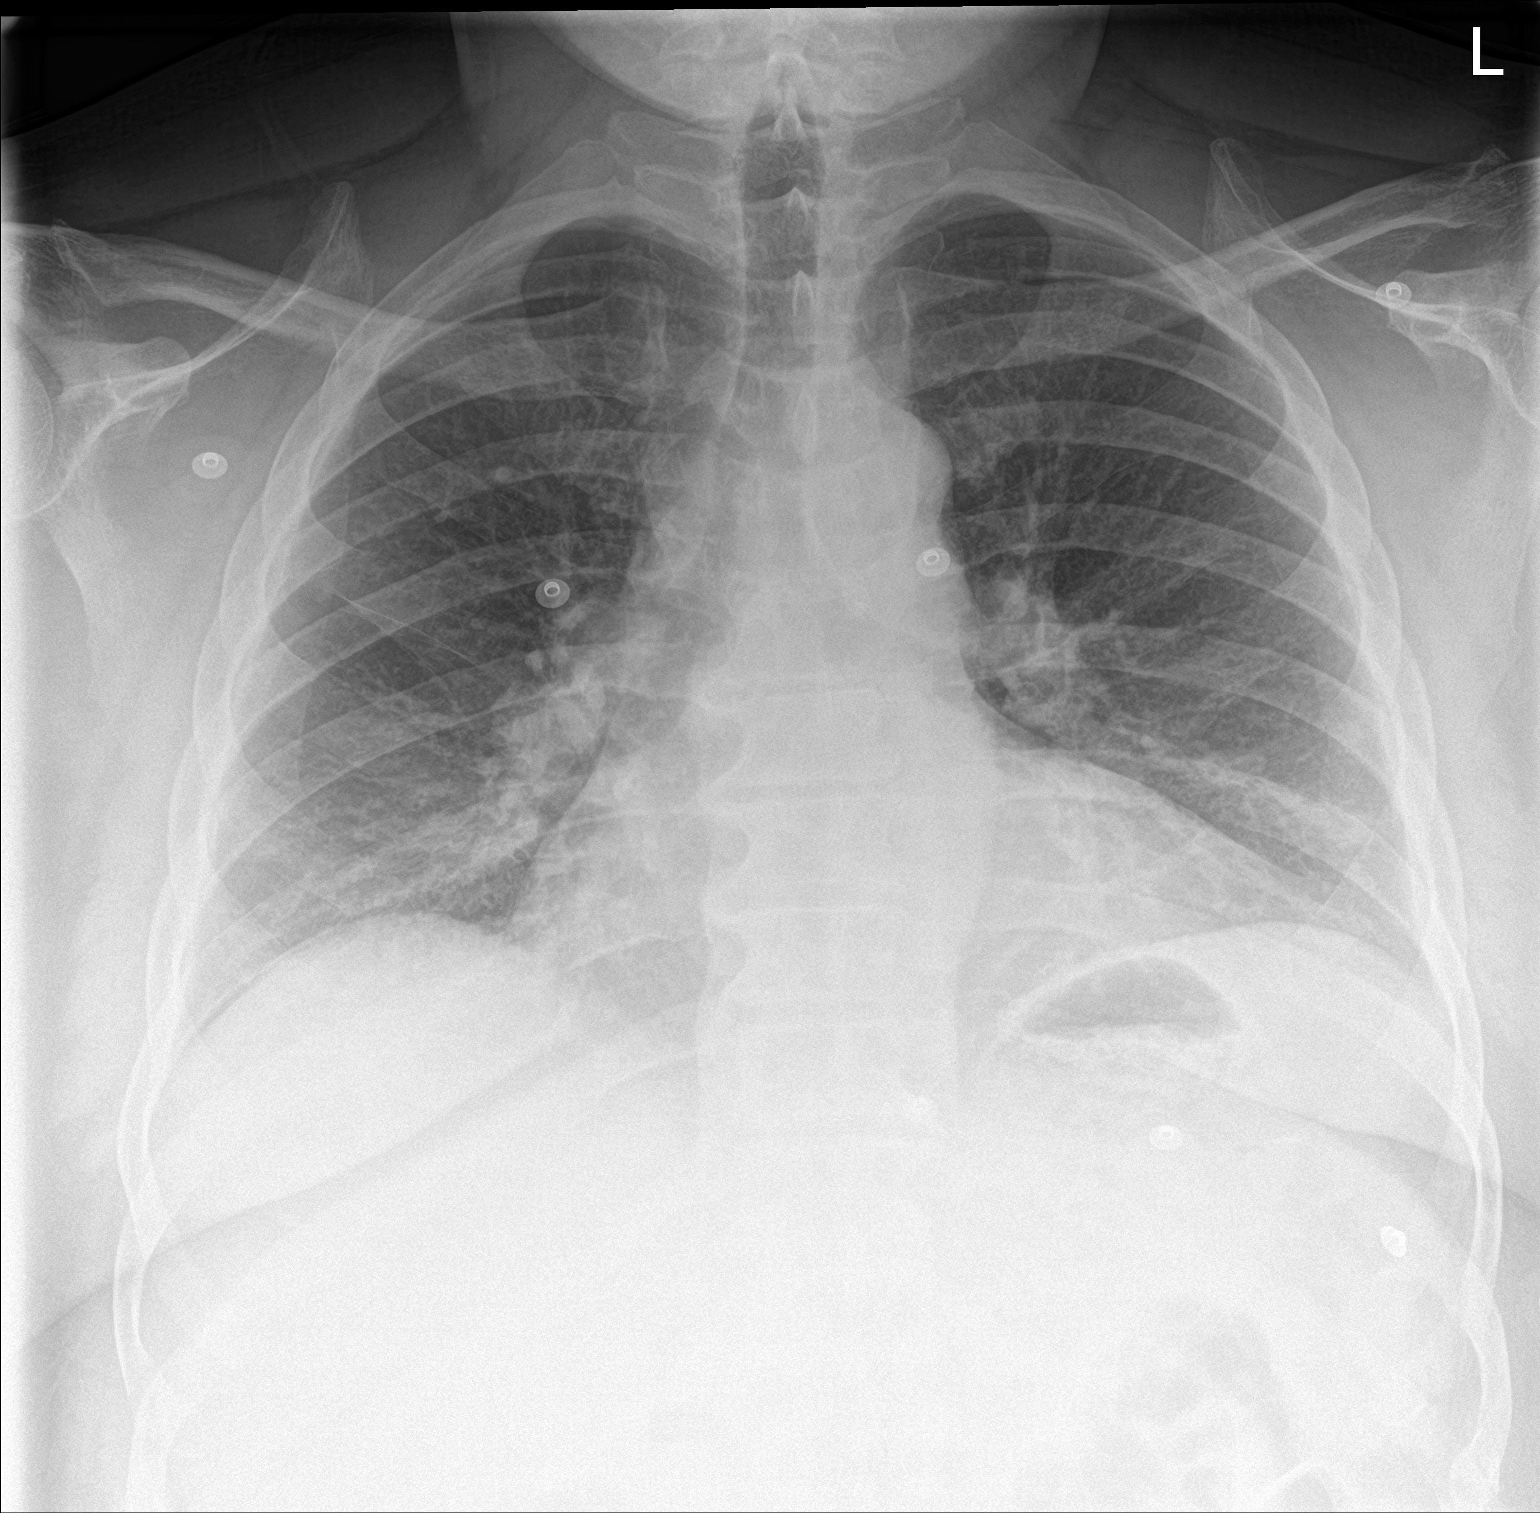

[chest lat]
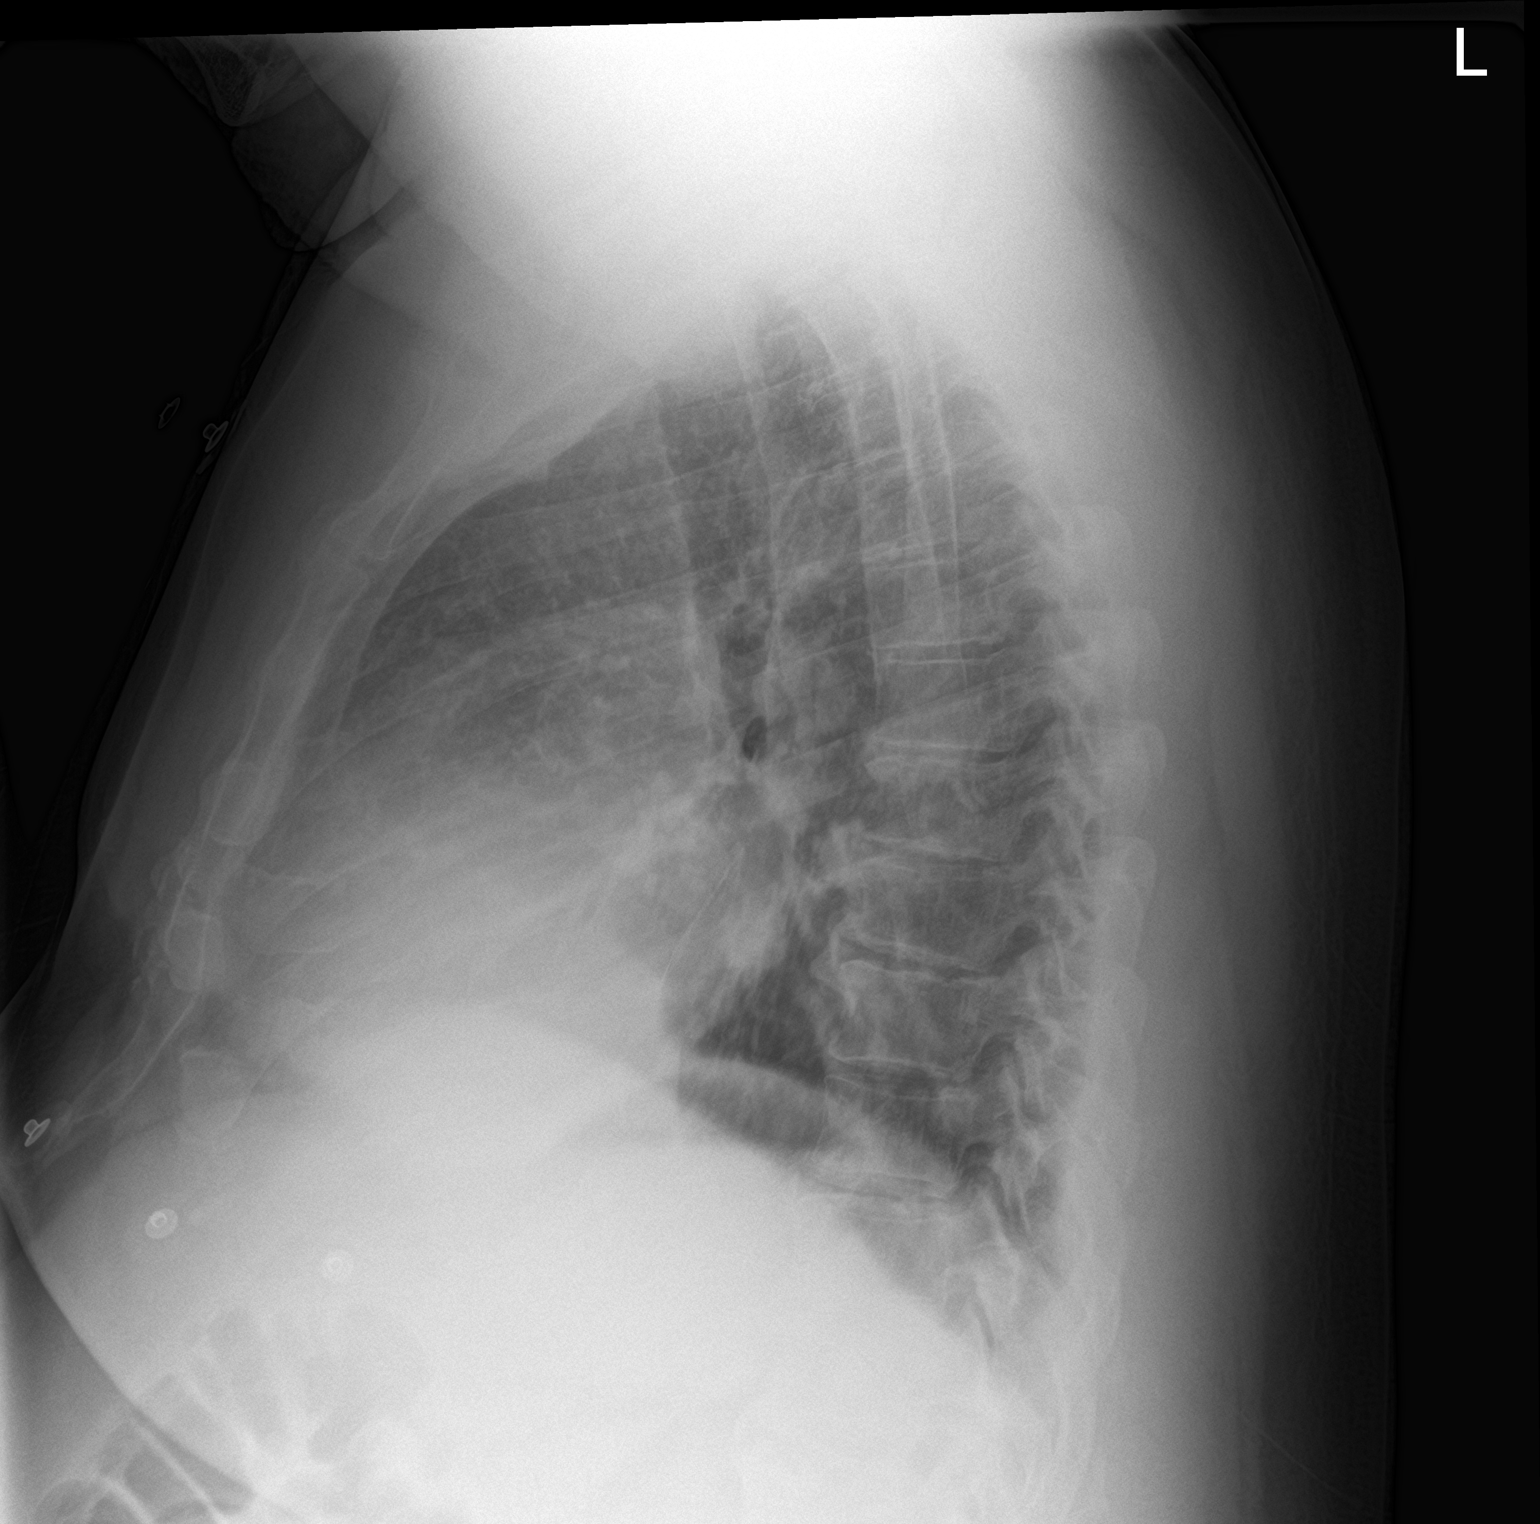

[2 of 2 positions shown; findings below may reference images not displayed]

FINDINGS: The heart is enlarged, unchanged. The mediastinal contours are
stable.

Previously seen vascular congestion and mild pulmonary interstitial
edema have improved. There is no new focal consolidation or
pulmonary edema. There is no pleural effusion or pneumothorax.

There is mild multilevel degenerative change of the thoracic spine.
There is no acute osseous abnormality.
IMPRESSION: Cardiomegaly. Otherwise, no radiographic evidence of acute
cardiopulmonary process.

## 2021-07-04 IMAGING — CT CT HEAD W/O CM
4 series · 17 of 47 positions shown, 19 images · non-contrast
Comparison: [DATE]

CLINICAL DATA: Dizziness.

EXAM:
CT HEAD WITHOUT CONTRAST
TECHNIQUE: Contiguous axial images were obtained from the base of the skull
through the vertex without intravenous contrast.

[Series 3: head without · axial · non-contrast · 0.43mm/px · z∈[-73,+52]mm · 7 of 35 slices shown, 9 images]
[im 5/35  brain]
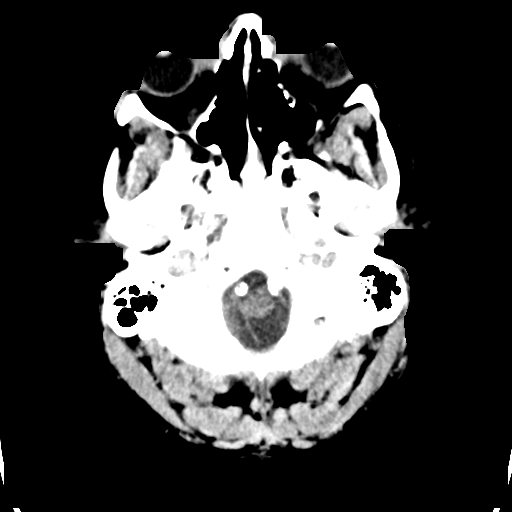
[im 5/35  bone]
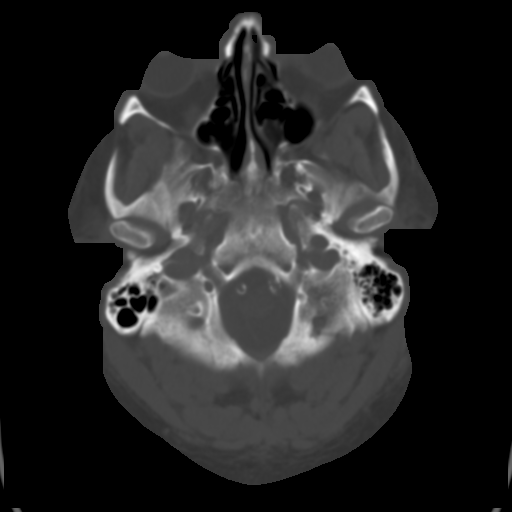
[im 9/35  brain]
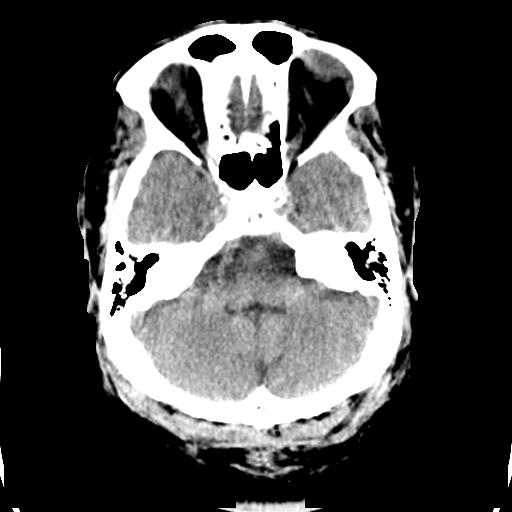
[im 13/35  brain]
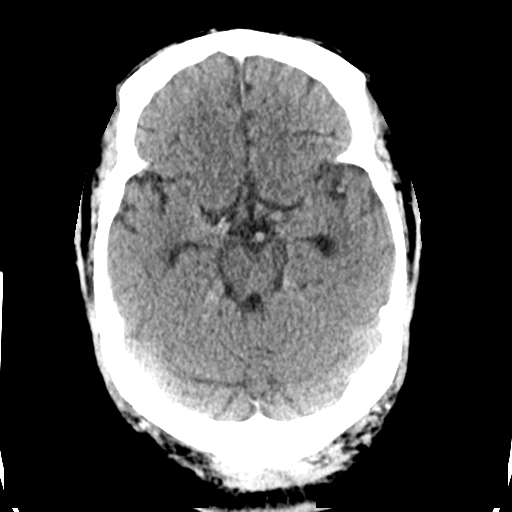
[im 18/35  brain]
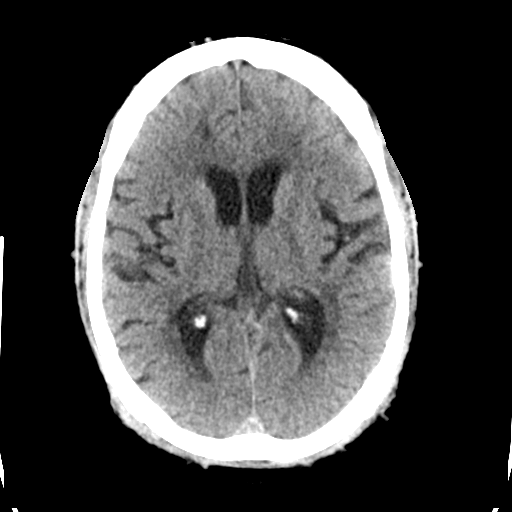
[im 22/35  brain]
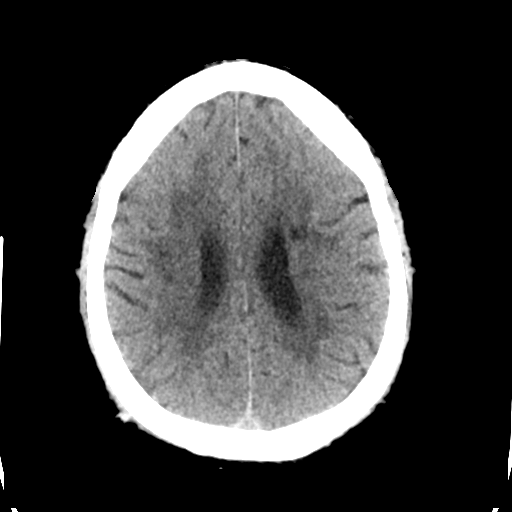
[im 22/35  bone]
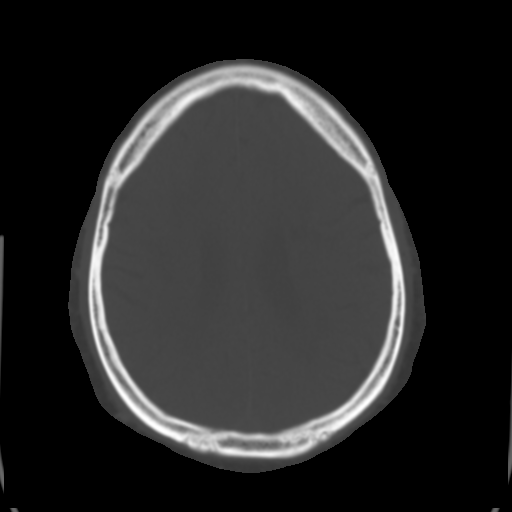
[im 26/35  brain]
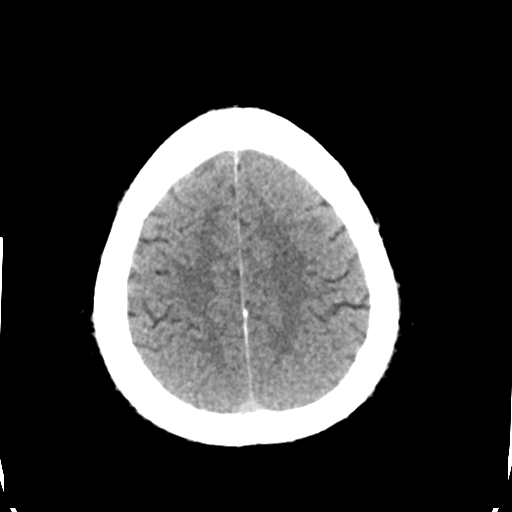
[im 30/35  brain]
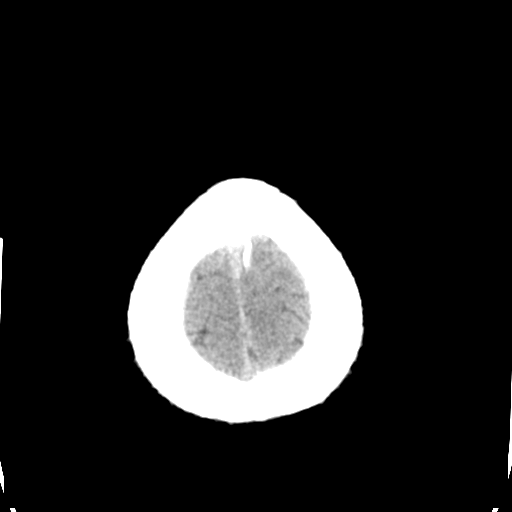

[Series 4: head bone · axial · 0.43mm/px · z∈[-77,-17]mm · 4 of 86 slices shown]
[im 9/86  bone]
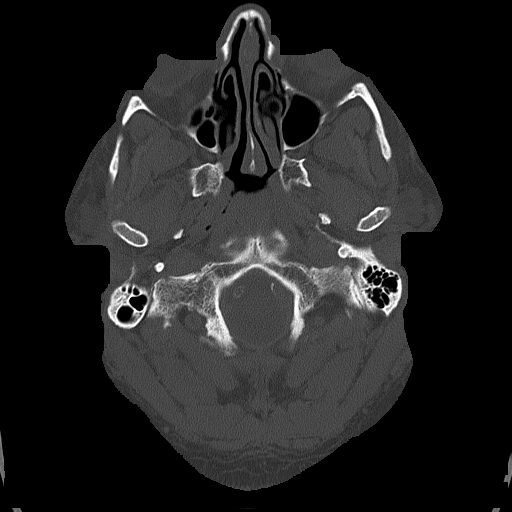
[im 18/86  bone]
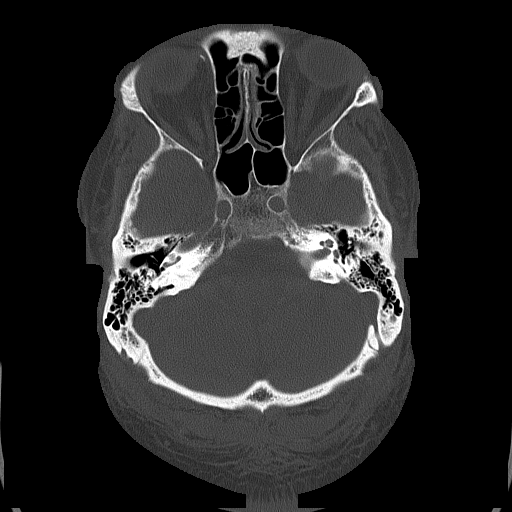
[im 26/86  bone]
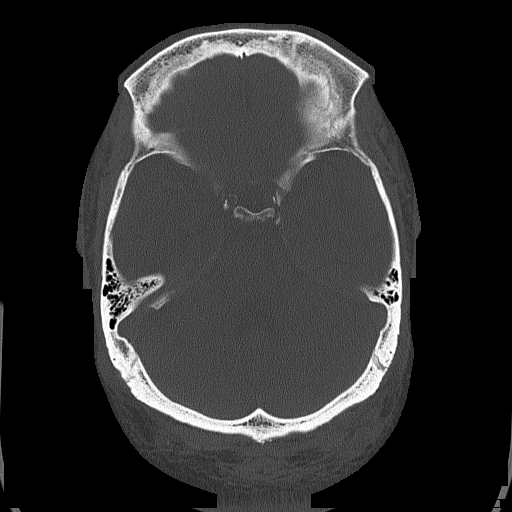
[im 39/86  bone]
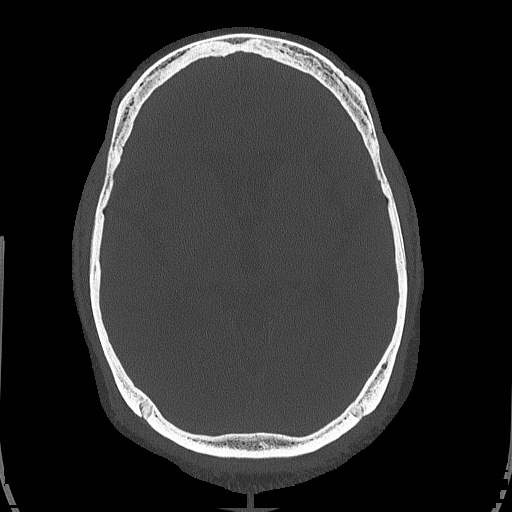

[Series 5: head without cor · coronal · non-contrast · 0.36mm/px · 3 of 74 slices shown]
[im 25/74  brain]
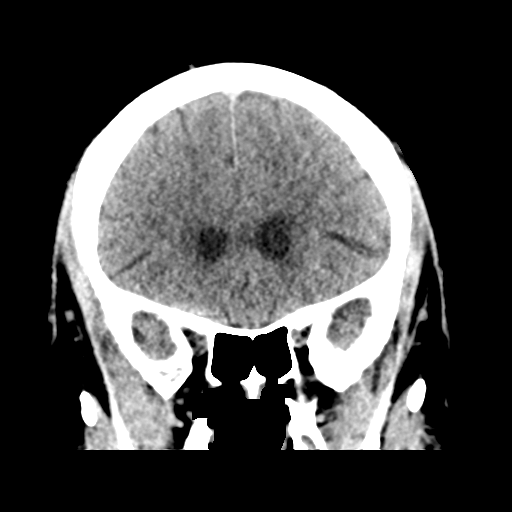
[im 33/74  brain]
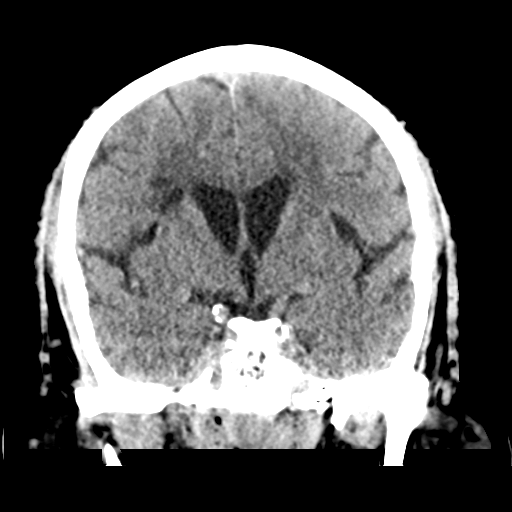
[im 41/74  brain]
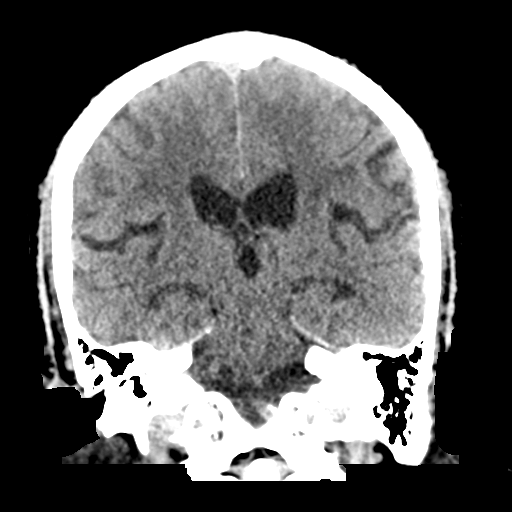

[Series 6: head without sag · sagittal · non-contrast · 0.35mm/px · 3 of 62 slices shown]
[im 21/62  brain]
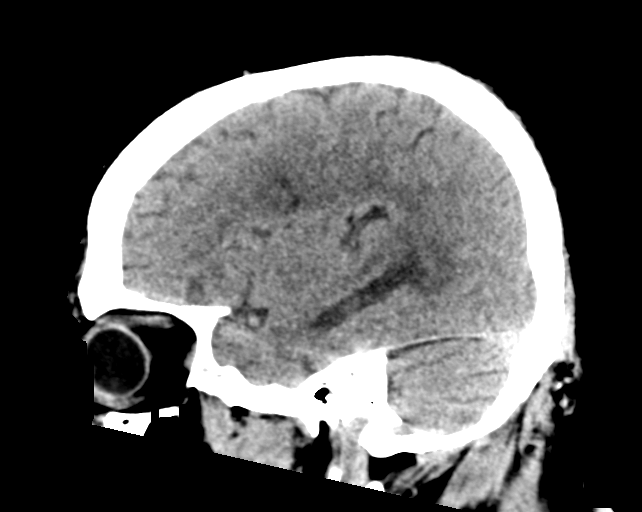
[im 31/62  brain]
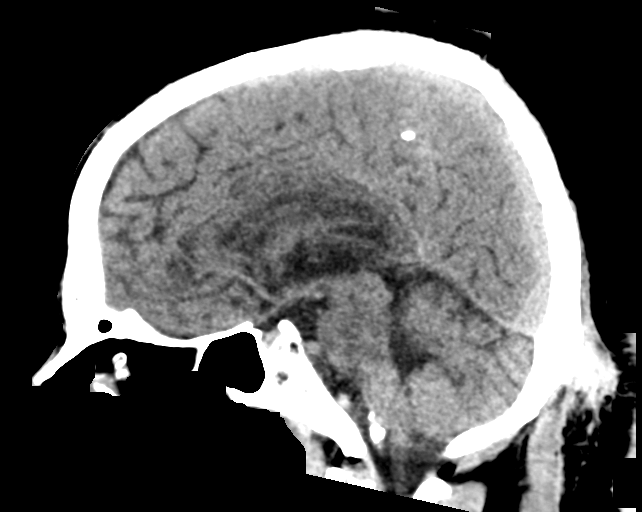
[im 41/62  brain]
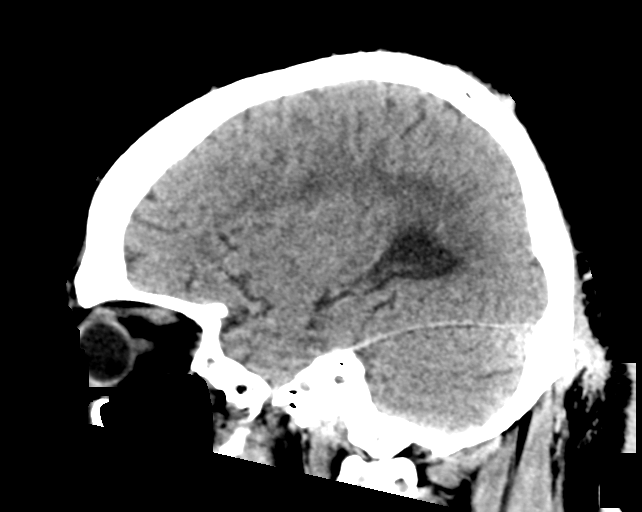

[17 of 47 positions shown; findings below may reference images not displayed]

FINDINGS: Brain: No evidence of acute infarction, hemorrhage, hydrocephalus,
extra-axial collection or mass lesion/mass effect.

There are areas of decreased attenuation within the white matter
tracts of the supratentorial brain, consistent with microvascular
disease changes. Additional focal areas of white matter low
attenuation are seen adjacent to the lateral ventricles. These areas
are stable in appearance when compared to the prior study.

Vascular: No hyperdense vessel or unexpected calcification.

Skull: Normal. Negative for fracture or focal lesion.

Sinuses/Orbits: No acute finding.

Other: None.
IMPRESSION: 1. No acute intracranial process.
2. Findings consistent with microvascular disease changes within the
white matter tracts of the supratentorial brain.
3. Focal areas of white matter low attenuation which may represent
chronic white matter infarcts.

## 2021-07-04 NOTE — ED Provider Notes (Signed)
Emergency Medicine Provider Triage Evaluation Note  [name]. , a 50 y.o. male  was evaluated in triage.  Pt complains of dizziness that started about an hour ago.  He denies any pain in his head or chest.  He had a similar episode last week.  He denies any drug use.   Review of Systems  Positive: dizzy Negative: New Weakness, numbness  Physical Exam  BP 105/62   Pulse 77   Temp 98.6 F (37 C)   Resp 18   SpO2 99%  Gen:   Awake, no distress   Resp:  Normal effort  MSK:   Moves extremities without difficulty Baseline weakness and right leg from prior stroke Other:  Normal finger-nose-finger bilaterally.  No pronator drift.  Full EOMs without nystagmus.  Medical Decision Making  Medically screening exam initiated at 12:59 PM.  Appropriate orders placed.  [name]. was informed that the remainder of the evaluation will be completed by another provider, this initial triage assessment does not replace that evaluation, and the importance of remaining in the ED until their evaluation is complete.  Note: Portions of this report may have been transcribed using voice recognition software. Every effort was made to ensure accuracy; however, inadvertent computerized transcription errors may be present    Douglas Gong, PA-C 07/04/21 1306    Douglas Loll, MD 07/11/21 [phone]

## 2021-07-04 NOTE — ED Triage Notes (Signed)
Pt here with c/o dizziness after trying to lift something heavy earlier today said he laid down hoping it would go away but it has not

## 2021-07-04 NOTE — ED Notes (Signed)
No answer when called for treatment room, pt not outside or in lobby bathrooms

## 2021-07-04 NOTE — Telephone Encounter (Signed)
ATC Patient.  LM to call back. 

## 2021-07-05 MED ORDER — AMPHETAMINE-DEXTROAMPHET ER 30 MG PO CP24: 30.0000 mg | ORAL_CAPSULE | Freq: Every day | ORAL | 0 refills | Status: DC | PRN

## 2021-07-05 NOTE — Telephone Encounter (Signed)
Last refill adderall xr 30 mg 05/25/21 # 30   Please send refill to [facility] [address], thanks

## 2021-07-05 NOTE — Telephone Encounter (Signed)
Spoke with the pt and notified of rx was sent  Nothing further needed

## 2021-07-05 NOTE — Telephone Encounter (Signed)
Adderall refilled

## 2021-07-11 ENCOUNTER — Telehealth: Payer: Self-pay | Admitting: *Deleted

## 2021-07-11 NOTE — Telephone Encounter (Signed)
Call to patient Needs appt to follow up recent ED visit and labs No answer, message left on recorder for return call

## 2021-07-12 ENCOUNTER — Other Ambulatory Visit: Payer: Self-pay | Admitting: Internal Medicine

## 2021-07-13 ENCOUNTER — Other Ambulatory Visit: Payer: Self-pay

## 2021-07-14 ENCOUNTER — Other Ambulatory Visit: Payer: Self-pay

## 2021-07-14 ENCOUNTER — Ambulatory Visit (INDEPENDENT_AMBULATORY_CARE_PROVIDER_SITE_OTHER): Payer: 59 | Admitting: Internal Medicine

## 2021-07-14 ENCOUNTER — Encounter: Payer: Self-pay | Admitting: Internal Medicine

## 2021-07-14 VITALS — BP 132/86 | HR 80 | Temp 98.4°F | Resp 24 | Ht 68.4 in | Wt 306.6 lb

## 2021-07-14 DIAGNOSIS — E875 Hyperkalemia: Secondary | ICD-10-CM

## 2021-07-14 DIAGNOSIS — I480 Paroxysmal atrial fibrillation: Secondary | ICD-10-CM | POA: Diagnosis not present

## 2021-07-14 DIAGNOSIS — N179 Acute kidney failure, unspecified: Secondary | ICD-10-CM

## 2021-07-14 DIAGNOSIS — E1165 Type 2 diabetes mellitus with hyperglycemia: Secondary | ICD-10-CM

## 2021-07-14 DIAGNOSIS — I10 Essential (primary) hypertension: Secondary | ICD-10-CM

## 2021-07-14 LAB — BASIC METABOLIC PANEL
Anion gap: 9 (ref 5–15)
BUN: 33 mg/dL — ABNORMAL HIGH (ref 6–20)
CO2: 23 mmol/L (ref 22–32)
Calcium: 9.2 mg/dL (ref 8.9–10.3)
Chloride: 107 mmol/L (ref 98–111)
Creatinine, Ser: 1.79 mg/dL — ABNORMAL HIGH (ref 0.61–1.24)
GFR, Estimated: 46 mL/min — ABNORMAL LOW (ref >60)
Glucose, Bld: 200 mg/dL — ABNORMAL HIGH (ref 70–99)
Potassium: 4.8 mmol/L (ref 3.5–5.1)
Sodium: 139 mmol/L (ref 135–145)

## 2021-07-14 LAB — POCT GLYCOSYLATED HEMOGLOBIN (HGB A1C): Hemoglobin A1C: 9 % — AB (ref 4.0–5.6)

## 2021-07-14 LAB — GLUCOSE, CAPILLARY: Glucose-Capillary: 219 mg/dL — ABNORMAL HIGH (ref 70–99)

## 2021-07-14 MED ORDER — AMLODIPINE BESYLATE 10 MG PO TABS: 10.0000 mg | ORAL_TABLET | Freq: Every day | ORAL | 11 refills | Status: DC

## 2021-07-14 MED ORDER — APIXABAN 5 MG PO TABS: 5.0000 mg | ORAL_TABLET | Freq: Two times a day (BID) | ORAL | 3 refills | Status: DC

## 2021-07-14 NOTE — Patient Instructions (Addendum)
[name],   It was a pleasure meeting you today. Today we discussed your kidney labs and elevated potassium. We are going to stop some of your medications today. These medications are:   - METFORMIN  - OLMESARTAN  - JARDIANCE  - IBUPROFIN  - HCTZ (Hydrochlorothiazide)  I will call you with the results of your blood work today, and will plan our next steps based off the results.   Have a good day, and I will talk to you soon.  Dolan Amen, MD

## 2021-07-14 NOTE — Progress Notes (Signed)
   CC: Abnormal ED Labs  HPI:  [name] is a 50 y.o. person, with a PMH noted below, who presents to the clinic Abnormal ED Labs. To see the management of their acute and chronic conditions, please see the A&P note under the Encounters tab.   Past Medical History:  Diagnosis Date   Acute pancreatitis 01/22/2019   Diabetes mellitus    GERD (gastroesophageal reflux disease)    History of alcohol abuse    History of cocaine use    History of echocardiogram    a. Echo 4/14: Moderate LVH, vigorous LVEF, EF 65-70%, normal wall motion, grade 2 diastolic dysfunction, mildly dilated aortic root and ascending aorta, ascending aorta 40 mm, aortic root 38 mm, mild LAE   Hx of cardiovascular stress test    a. GXT 5/14: No ischemic changes  //  b. ETT-Myoview 3/16:  Low risk, no ischemia, EF 58%   Hyperlipidemia    Hypertension    Morbid obesity (HCC)    Paroxysmal atrial fibrillation (HCC)    Occurring in 2008, with several recurrence since then (including in the setting of + cocaine on UDS).   Sleep apnea    uses CPAP   Stroke (HCC)    x3   SVT (supraventricular tachycardia) (HCC)    mid to long RP SVT 09/2019   Review of Systems:   Review of Systems  Constitutional:  Negative for chills, fever, malaise/fatigue and weight loss.  Cardiovascular:  Negative for chest pain, palpitations, orthopnea and claudication.  Gastrointestinal:  Negative for abdominal pain, constipation, diarrhea, nausea and vomiting.  Genitourinary:  Negative for dysuria, frequency and urgency.  Neurological:  Negative for dizziness and headaches.    Physical Exam:  Vitals:   07/14/21 1026  BP: 132/86  Pulse: 80  Resp: (!) 24  Temp: 98.4 F (36.9 C)  TempSrc: Oral  SpO2: 99%  Weight: (!) 306 lb 9.6 oz (139.1 kg)  Height: 5' 8.4" (1.737 m)   Physical Exam Constitutional:      General: He is not in acute distress.    Appearance: Normal appearance. He is obese. He is not ill-appearing or  toxic-appearing.  Cardiovascular:     Rate and Rhythm: Normal rate and regular rhythm.     Pulses: Normal pulses.     Heart sounds: Normal heart sounds. No murmur heard.   No friction rub. No gallop.  Pulmonary:     Effort: Pulmonary effort is normal. No respiratory distress.     Breath sounds: Normal breath sounds. No wheezing, rhonchi or rales.  Abdominal:     General: Abdomen is flat. Bowel sounds are normal.     Palpations: Abdomen is soft.     Tenderness: There is no abdominal tenderness. There is no right CVA tenderness, left CVA tenderness or guarding.  Musculoskeletal:     Right lower leg: No edema.     Left lower leg: No edema.  Skin:    General: Skin is warm and dry.  Neurological:     General: No focal deficit present.     Mental Status: He is alert and oriented to person, place, and time.  Psychiatric:        Mood and Affect: Mood normal.        Behavior: Behavior normal.     Assessment & Plan:   See Encounters Tab for problem based charting.  Patient discussed with Dr. Antony Contras

## 2021-07-15 ENCOUNTER — Telehealth: Payer: Self-pay

## 2021-07-15 ENCOUNTER — Encounter: Payer: Self-pay | Admitting: Internal Medicine

## 2021-07-15 LAB — URINALYSIS, ROUTINE W REFLEX MICROSCOPIC
Bilirubin, UA: NEGATIVE
Ketones, UA: NEGATIVE
Leukocytes,UA: NEGATIVE
Nitrite, UA: NEGATIVE
RBC, UA: NEGATIVE
Specific Gravity, UA: >=1.03 — AB (ref 1.005–1.030)
Urobilinogen, Ur: 0.2 mg/dL (ref 0.2–1.0)
pH, UA: 5 (ref 5.0–7.5)

## 2021-07-15 LAB — MICROSCOPIC EXAMINATION
Bacteria, UA: NONE SEEN
Casts: NONE SEEN /lpf
Epithelial Cells (non renal): NONE SEEN /hpf (ref 0–10)
RBC, Urine: NONE SEEN /hpf (ref 0–2)
WBC, UA: NONE SEEN /hpf (ref 0–5)

## 2021-07-15 NOTE — Assessment & Plan Note (Signed)
Patient being reevaluated for recent ED visit and found to have a potassium of 5.6 in the setting of an AKI.  Patient is currently asymptomatic at the time and stable.  Will reevaluate BMP today, if he continues to have hyperkalemia we will prescribe Kayexalate with close clinical follow-up.  Although, pending on the rest of his labs he may need hospital admission. - BMP stat

## 2021-07-15 NOTE — Telephone Encounter (Signed)
Requesting to speak with a nurse about lab results, please call pt back.  

## 2021-07-15 NOTE — Assessment & Plan Note (Addendum)
Patient presents today for follow-up from the ED after having abnormal labs.  Patient has baseline creatinine of 1.46, elevated to 2.48 with a potassium of 5.6 on presentation to the ED.  Unfortunately, patient left the ED before he was called in for further evaluation.  Patient states that since leaving the ED, his urine has been darker than usual, and has been thirstier than usual.  He feels like he needs to strain to initiate titration.  He is taking all of his medications as prescribed, although he is run out of his Eliquis today.  Patient states that he feels at baseline currently.  He denies any nausea, vomiting, abdominal pain.  He does endorse some dizziness which she attributes to the stroke he had earlier this year.   A/P: Patient presents to the clinic for follow-up on his kidney function from the ED. AKI on likely CKD, this could be in the presence of postobstructive, hypovolemia, or ATN/AIN.  Discussed results with the patient today, and concerns for ongoing worsening of his kidney function. He is on multiple medications that could affect his kidney function which we will hold today. We will further evaluate his kidney function today, if it is worsening he may need direct mission to the hospital.  He completed a post void with 220 cc out and retaining 69 cc after micturition.  Will obtain urinalysis and repeat BMP to see if his kidney function has continued to decrease and further assess his hyperkalemia. - Obtain BMP and urinalysis - DC METFORMIN, OLMESARTAN, JARDIANCE, IBUPROFIN, HCTZ  Addendum: Spoke with patient about his resolving AKI on likely CKD, discussed close follow-up in the clinic which he is agreeable to.  We will have him come back in on July 19, 2019.

## 2021-07-18 ENCOUNTER — Telehealth: Payer: Self-pay

## 2021-07-18 ENCOUNTER — Ambulatory Visit: Payer: 59 | Admitting: Physician Assistant

## 2021-07-18 ENCOUNTER — Encounter (INDEPENDENT_AMBULATORY_CARE_PROVIDER_SITE_OTHER): Payer: 59 | Admitting: Ophthalmology

## 2021-07-18 NOTE — Telephone Encounter (Signed)
Patient was contacted for smoking cessation follow-up. Left voicemail for patient to return my call at [phone].  Rushie Goltz, Pharm.D. PGY-1 Pharmacy Resident [phone] 07/18/2021 5:15 PM

## 2021-07-18 NOTE — Addendum Note (Signed)
Addended by: Bufford Spikes on: 07/18/2021 09:32 AM   Modules accepted: Orders

## 2021-07-21 ENCOUNTER — Other Ambulatory Visit: Payer: Self-pay

## 2021-07-21 ENCOUNTER — Encounter: Payer: Self-pay | Admitting: Student

## 2021-07-21 ENCOUNTER — Ambulatory Visit (INDEPENDENT_AMBULATORY_CARE_PROVIDER_SITE_OTHER): Payer: 59 | Admitting: Student

## 2021-07-21 VITALS — BP 124/65 | HR 72 | Temp 97.5°F | Ht 69.0 in | Wt 312.8 lb

## 2021-07-21 DIAGNOSIS — Z23 Encounter for immunization: Secondary | ICD-10-CM

## 2021-07-21 DIAGNOSIS — I129 Hypertensive chronic kidney disease with stage 1 through stage 4 chronic kidney disease, or unspecified chronic kidney disease: Secondary | ICD-10-CM

## 2021-07-21 DIAGNOSIS — N1831 Chronic kidney disease, stage 3a: Secondary | ICD-10-CM

## 2021-07-21 DIAGNOSIS — E875 Hyperkalemia: Secondary | ICD-10-CM

## 2021-07-21 DIAGNOSIS — E1122 Type 2 diabetes mellitus with diabetic chronic kidney disease: Secondary | ICD-10-CM

## 2021-07-21 DIAGNOSIS — N179 Acute kidney failure, unspecified: Secondary | ICD-10-CM | POA: Diagnosis not present

## 2021-07-21 DIAGNOSIS — E1165 Type 2 diabetes mellitus with hyperglycemia: Secondary | ICD-10-CM

## 2021-07-21 DIAGNOSIS — I10 Essential (primary) hypertension: Secondary | ICD-10-CM

## 2021-07-21 LAB — GLUCOSE, CAPILLARY: Glucose-Capillary: 186 mg/dL — ABNORMAL HIGH (ref 70–99)

## 2021-07-21 NOTE — Progress Notes (Signed)
   CC: Follow-up on lab results  HPI:  [name]. is a 50 y.o. male with PMH as below presenting to clinic today to follow-up on labs. Please see problem based charting for evaluation, assessment and plan.  Past Medical History:  Diagnosis Date   Acute pancreatitis 01/22/2019   Diabetes mellitus    GERD (gastroesophageal reflux disease)    History of alcohol abuse    History of cocaine use    History of echocardiogram    a. Echo 4/14: Moderate LVH, vigorous LVEF, EF 65-70%, normal wall motion, grade 2 diastolic dysfunction, mildly dilated aortic root and ascending aorta, ascending aorta 40 mm, aortic root 38 mm, mild LAE   Hx of cardiovascular stress test    a. GXT 5/14: No ischemic changes  //  b. ETT-Myoview 3/16:  Low risk, no ischemia, EF 58%   Hyperlipidemia    Hypertension    Morbid obesity (HCC)    Paroxysmal atrial fibrillation (HCC)    Occurring in 2008, with several recurrence since then (including in the setting of + cocaine on UDS).   Sleep apnea    uses CPAP   Stroke (HCC)    x3   SVT (supraventricular tachycardia) (HCC)    mid to long RP SVT 09/2019    Review of Systems:  Constitutional: Negative for fever or fatigue.  Positive for polydipsia Eyes: Negative for visual changes Respiratory: Negative for shortness of breath Cardiac: Negative for chest pain MSK: Positive for back pain.  Abdomen: Negative for abdominal pain, constipation or diarrhea GU: Positive for polyuria Neuro: Negative for headache, dizziness or weakness  Physical Exam: General: Pleasant, obese middle-age male. No acute distress. Cardiac: RRR. No murmurs, rubs or gallops. No LE edema Respiratory: Lungs CTAB. No wheezing or crackles. Abdominal: Soft, symmetric and non tender. Normal BS. Skin: Warm, dry and intact without rashes or lesions Extremities: Atraumatic. Full ROM. Palpable radial and DP pulses. Neuro: A&O x 3. Moves all extremities Psych: Appropriate mood and  affect.  Vitals:   07/21/21 1456  BP: 124/65  Pulse: 72  Temp: (!) 97.5 F (36.4 C)  SpO2: 99%  Weight: (!) 312 lb 12.8 oz (141.9 kg)  Height: 5\' 9"  (1.753 m)     Assessment & Plan:   See Encounters Tab for problem based charting.  Patient discussed with Dr. , MD, MPH

## 2021-07-21 NOTE — Patient Instructions (Addendum)
Thank you, [name] for allowing [place] to provide your care today. Today we discussed your kidney labs, diabetes, and blood pressure. We are checking your kidney function again and we will restart you on your diabetes medication if this continues to improve.  I have ordered the following labs for you:  Lab Orders         BMP8+Anion Gap         POCT CBG (Fasting - Glucose)       I will call if any are abnormal. All of your labs can be accessed through "My Chart".  I have place a referrals to our diabetes educator to help with weight loss and diabetes management.   My Chart Access: [email or web address]  Please follow-up in in 2 weeks.  Please make sure to arrive 15 minutes prior to your next appointment. If you arrive late, you may be asked to reschedule.    We look forward to seeing you next time. Please call our clinic at [phone] if you have any questions or concerns. The best time to call is Monday-Friday from 9am-4pm, but there is someone available 24/7. If after hours or the weekend, call the main hospital number and ask for the Internal Medicine Resident On-Call. If you need medication refills, please notify your pharmacy one week in advance and they will send [place] a request.   Thank you for letting us take part in your care. Wishing you the best!  Steffanie Rainwater, MD 07/21/2021, 3:30 PM IM Resident, PGY-2 [name] 41:10

## 2021-07-22 ENCOUNTER — Encounter: Payer: Self-pay | Admitting: Student

## 2021-07-22 LAB — BMP8+ANION GAP
Anion Gap: 16 mmol/L (ref 10.0–18.0)
BUN/Creatinine Ratio: 18 (ref 9–20)
BUN: 24 mg/dL (ref 6–24)
CO2: 19 mmol/L — ABNORMAL LOW (ref 20–29)
Calcium: 9.2 mg/dL (ref 8.7–10.2)
Chloride: 102 mmol/L (ref 96–106)
Creatinine, Ser: 1.35 mg/dL — ABNORMAL HIGH (ref 0.76–1.27)
Glucose: 185 mg/dL — ABNORMAL HIGH (ref 70–99)
Potassium: 4.9 mmol/L (ref 3.5–5.2)
Sodium: 137 mmol/L (ref 134–144)
eGFR: 64 mL/min/{1.73_m2} (ref >59)

## 2021-07-22 MED ORDER — METFORMIN HCL 1000 MG PO TABS: ORAL_TABLET | ORAL | 1 refills | Status: DC

## 2021-07-22 NOTE — Assessment & Plan Note (Addendum)
Patient here for follow-up labs to evaluate AKI.  BNP significantly improved and back to his baseline creatinine of 1.4-1.5.  Due to the improvement in kidney function, I will will start patient back on a 2 diabetes medication slowly. BMP Latest Ref Rng & Units 07/21/2021 07/14/2021 07/04/2021  Glucose 70 - 99 mg/dL 561(B) 379(K) 327(M)  BUN 6 - 24 mg/dL 24 14(J) 09(K)  Creatinine 0.76 - 1.27 mg/dL 9.57(M) 7.34(Y) 3.70(D)  BUN/Creat Ratio 9 - 20 18 - -  Sodium 134 - 144 mmol/L 137 139 135  Potassium 3.5 - 5.2 mmol/L 4.9 4.8 5.6(H)  Chloride 96 - 106 mmol/L 102 107 107  CO2 20 - 29 mmol/L 19(L) 23 19(L)  Calcium 8.7 - 10.2 mg/dL 9.2 9.2 6.4(R)     Plan: -- Restart metformin 1000 mg daily for 1 week, then 1000 mg twice daily -- Patient encouraged to drink plenty of water and continue to avoid NSAID -- BMP check at next office visit on 10/20

## 2021-07-22 NOTE — Assessment & Plan Note (Addendum)
Patient's diabetes remains uncontrolled with A1c of 9.0 one week ago. Patient's metformin and Jardiance were discontinued last week due to AKI on CKD. Patient continues to endorse polyuria and polydipsia but denies any headaches or dizziness. States his blood sugars have been in the 200s since he discontinued his medications.   Plan: -- Continue Trulicity 1.5 mg weekly -- Restart metformin 1000 mg daily for 1 week then 1000 mg twice daily after that. We will reintroduce Jardiance at next office visit if kidney function remains stable. -- Continue home glucose monitoring -- Ambulatory referral to diabetes educator for management of diabetes and weight loss/nutrition -- Follow-up in 2 weeks

## 2021-07-22 NOTE — Assessment & Plan Note (Signed)
BMI of 46.19. Patient has gained 6 pounds since his last visit 1 week ago. -- Referred to diabetic educator for weight loss/nutrition -- Encouraged lifestyle modifications with dietary changes and exercise

## 2021-07-22 NOTE — Assessment & Plan Note (Signed)
Resolved. K+ of 4.9 today. Patient denies any muscle pain. We will continue to monitor closely. -- BMP at next office visit.

## 2021-07-22 NOTE — Assessment & Plan Note (Signed)
BP stable on current regimen.  Will consider reintroducing ARB/ACE for renal protection of kidney function remained stable. Vitals:   07/21/21 1456  BP: 124/65   Plan: -- Continue metoprolol succinate 100 mg daily -- Continue amlodipine 10 mg daily -- BMP check at next office visit

## 2021-07-23 LAB — COLOGUARD

## 2021-07-25 ENCOUNTER — Ambulatory Visit: Payer: 59 | Admitting: Adult Health

## 2021-07-25 ENCOUNTER — Other Ambulatory Visit: Payer: Self-pay

## 2021-07-25 NOTE — Progress Notes (Deleted)
[facility] [address] [place]. [name] [address] [phone]       STROKE FOLLOW UP NOTE  [name] Date of Birth:  11-17-1970 Medical Record Number:  [id number]   Reason for Referral: stroke follow up    SUBJECTIVE:   CHIEF COMPLAINT:  No chief complaint on file.    HPI:   Today, 07/25/2021, [name] returns for 72-month stroke follow-up.  Overall stable from stroke standpoint.  Reports residual ***.  Completed PT -continues HEP ***.  MBS largely within normal limits of swallowing function and remains on regular diet.  Denies new stroke/TIA symptoms.  Compliant on aspirin and Eliquis as well as atorvastatin without side effects.  Blood pressure today ***.  Recent A1c 9.0 (increased from 8.2) followed by PCP.  TEE 05/20/2021 no evidence of cardiac thrombus or PFO.  Hypercoagulable labs largely unremarkable.  Tobacco use ***.  Cocaine use ***.  Endorses nightly compliance on CPAP routinely followed by pulmonology Dr. Annamaria Boots.       History provided for reference purposes Update 04/19/2021 JM: [name] returns for scheduled stroke follow-up visit but unfortunately had recent stroke on 04/14/2021.  He presented with worsening slurred speech and swallowing difficulty with MRI showing small right pontine, left frontal white matter and left temporal white matter infarcts.  CTA head/neck unremarkable.  LDL 49, TG 210.  A1c 8.2.  UDS positive for cocaine.  2D echo showed EF of 60 to 65%.  Etiology possibly synchronized small vessel disease given locations and uncontrolled risk factors (HTN, DM, tobacco use, OSA and cocaine) although cardioembolic etiology cannot be ruled out given vessel territory.  Recommended continued use of Eliquis as well as adding aspirin 81 mg daily and continuation of atorvastatin 80 mg daily.  Recommended TEE to rule out large PFO or cardiac tumor in setting of recurrent strokes on Eliquis however recommended outpatient due to  holiday weekend.  LE venous Doppler negative.  Hypercoagulable labs pending at discharge.  Evaluated by therapies who recommended outpatient PT for decreased mobility and imbalance.   Since discharge, he continues to experience greater swallowing difficulty with thin liquids.  Denies any difficulty with solid foods.  He believes speech has returned back to baseline.  Right-sided weakness and gait stable.  Bedside SLP swallow eval during hospitalization cleared for regular diet and thin liquids as swallowing within functional limits.  He has not yet started PT and questions why he needs this.  He was previously working with neuro's which were completed in April.  He continues to live with his sister who assists with IADLs but is able to maintain ADLs independently. Denies new or worsening stroke/TIA symptoms since discharge  Reports compliance on Eliquis, aspirin and atorvastatin without associated side effects Blood pressure today 170/90 - does not routinely monitor at home  Glucose levels routinely monitored at home and typically greater than 200 (per pt report) -reports compliance on diabetic regimen Routinely follows with pulmonology Dr. Annamaria Boots for sleep apnea and narcolepsy -recent OV ntoe personally reviewed with reported noncompliance for CPAP treatment and plans on repeating split-night study to establish control of OSA - he has not yet completed but does report since recent stroke, he has restarted his CPAP He has tried to smoke cigarettes but reports having difficulty inhaling since recent stroke -previously smoking 1 pack/day.  Currently using nicotine patch. He denies any additional cocaine use and reports one-time use the week of his stroke - he does not plan on using again  in the future   Hypercoagulable labs reviewed which showed slightly elevated anticardiolipin IgM 14 and slightly elevated homocystine 15.5.  Beta-2 glycoprotein negative.  Lupus anticoagulant remains pending.   Update  12/14/2020 JM: [name] returns for stroke follow-up unaccompanied.  He was initially scheduled 11/18/2020 for routine follow-up but left prior to being seen as he was late for appointment.  He was seen by his PCP that day with complaints of 2-day onset of worsening right-sided weakness and dysarthria.  Initially planned on doing further work-up outpatient as symptoms present >48 hours but lab work showed hyperkalemia therefore PCP advised pt to proceed to ED for further management on 11/19/2020.  MRI showed acute infarction in the left pons, right lateral splenium of the corpus callosum and medial left temporal lobe/hippocampus felt likely microembolic with history of A. fib despite compliance on Xarelto.  MRA head/neck unremarkable.  Due to concern of Xarelto failure, switched to Eliquis 5 mg twice daily.  Aspirin 81 mg daily also initiated.  Potassium levels stabilized with medication adjustments and interventions and advised outpatient follow-up with PCP.  Since discharge, reports continued right sided weakness, dysarthria, gait impairment with imbalance, cognitive impairment, and possible dysphagia.  SLP reached out prior to todays visit with pt reporting concern of occasional coughing with water - no difficulty with food but typically difficulty with water - SLP Glendell Docker requested MBS for further evaluation Currently working with neuro rehab PT/OT/SLP He is currently awaiting for rolling walker to help him ambulate longer distances -currently ambulating without assistive device but denies any recent falls Denies new or worsening stroke/TIA symptoms  Reports compliance on Eliquis 5 mg twice daily and aspirin 81 mg daily-denies side effects Reports compliance on atorvastatin 80 mg daily -denies side effects Blood pressure today 160/96 - does not routinely monitor at home but has been elevated during therapy visits Glucose levels not currently monitored at home as he recently ran out of lancets and plans on  contacting PCP for refill  He continues to follow with pulmonology Dr. Annamaria Boots for history of narcolepsy with cataplexy and sleep apnea but plans on pursuing CPAP/BiPAP titration due to difficulty tolerating CPAP.  Titration study scheduled 01/16/2021.   Lab work 11/19/2020 A1c 7.7 (down from 9.9) LDL 102 -  Increased atorvastatin from 40 mg to 80 mg daily  Continued tobacco use approx 1 pack/week with eventual goal of complete cessation  No further concerns at this time   Initial visit 08/09/2020 JM: [name] is being seen for hospital follow-up unaccompanied.  Reports residual mild to moderate speech difficulty, short-term memory impairment, imbalance and visual impairment. Denies residual right sided weakness.  He has not worked with any therapy but PCP placed referral to initiate physical and speech therapy but he has not scheduled visits at this time.  Reports worsening vision post stroke with underlying history of diabetic retinopathy.  Denies new or worsening stroke/TIA symptoms.  He reports ongoing compliance with Xarelto and denies bleeding or bruising.  Reports compliance with atorvastatin and denies myalgias.  Blood pressure today satisfactory at 127/77.  He does not routinely monitor at home.  Glucose level stable per patient.  He does report excessive daytime fatigue but has been noncompliant with CPAP over the past couple months due to increased anxiety and "fear of choking".  He has not further discuss this with his pulmonologist.  Prescribed Adderall for history of narcolepsy but he has not taken since July as he has not been working although per epic reviewed,  pulmonologist recently refilled Adderall prescription.  No further concerns at this time.   Stroke admission 06/01/2020 [name] is a 50 y.o. male with history of atrial fibrillation, OSA on CPAP, narcolepsy, diabetes, hypertension, morbid obesity, GERD, history of polysubstance abuse including cocaine and alcohol   who presented on 06/01/2020 with persistent dysarthric speech.  Stroke work-up revealed small L caudate body infarct embolic secondary to known AF source not consistently taking Xarelto.  Recommended continuation of Xarelto consistently for secondary stroke prevention.  HTN stable.  LDL 103 and increase atorvastatin to 40 mg daily.  Uncontrolled DM with A1c 9.9.  Other stroke risk factors include tobacco use, hx ETOH and substance abuse, obesity, family history of stroke and OSA on CPAP.  No prior personal history of stroke.  Evaluated by therapy without PT/OT needs but recommended outpatient SLP for residual cognitive impairment and mild to moderate dysarthria  Stroke:   Small L caudate body infarct embolic secondary to known AF source not consistently taking Xarelto CT head focal hyperdensities B corona radiata ? Acute infarct, prominent Small vessel disease and Atrophy.  MRI  Small L caudate body infarct. Small vessel disease. Atrophy.  MR CS Unremarkable  MRA head Unremarkable  MRA neck Unremarkable  2D Echo EF 60-65%. No source of embolus. LA mildly dilated LDL 103 HgbA1c 9.9 VTE prophylaxis - SCDs   Xarelto (rivaroxaban) daily prior to admission, now on aspirin 325 mg daily. Not taking xarelto as prescribed - resume xarelto and stop aspirin  Therapy recommendations:  HH SLP Disposition:  home      ROS:   14 system review of systems performed and negative with exception of those listed in HPI  PMH:  Past Medical History:  Diagnosis Date   Acute pancreatitis 01/22/2019   Diabetes mellitus    GERD (gastroesophageal reflux disease)    History of alcohol abuse    History of cocaine use    History of echocardiogram    a. Echo 4/14: Moderate LVH, vigorous LVEF, EF 65-70%, normal wall motion, grade 2 diastolic dysfunction, mildly dilated aortic root and ascending aorta, ascending aorta 40 mm, aortic root 38 mm, mild LAE   Hx of cardiovascular stress test    a. GXT 5/14: No ischemic  changes  //  b. ETT-Myoview 3/16:  Low risk, no ischemia, EF 58%   Hyperlipidemia    Hypertension    Morbid obesity ([place])    Paroxysmal atrial fibrillation ([place])    Occurring in 2008, with several recurrence since then (including in the setting of + cocaine on UDS).   Sleep apnea    uses CPAP   Stroke ([place])    x3   SVT (supraventricular tachycardia) ([place])    mid to long RP SVT 09/2019    PSH:  Past Surgical History:  Procedure Laterality Date   APPENDECTOMY     ATRIAL FIBRILLATION ABLATION N/A 11/27/2019   Procedure: ATRIAL FIBRILLATION ABLATION;  Surgeon: Thompson Grayer, MD;  Location: [facility];  Service: Cardiovascular;  Laterality: N/A;   BUBBLE STUDY  05/20/2021   Procedure: BUBBLE STUDY;  Surgeon: Donato Heinz, MD;  Location: [facility];  Service: Cardiovascular;;   ROTATOR CUFF REPAIR     SVT ABLATION N/A 11/27/2019   Procedure: SVT ABLATION;  Surgeon: Thompson Grayer, MD;  Location: [facility];  Service: Cardiovascular;  Laterality: N/A;   TEE WITHOUT CARDIOVERSION N/A 05/20/2021   Procedure: TRANSESOPHAGEAL ECHOCARDIOGRAM (TEE);  Surgeon: Donato Heinz, MD;  Location: [facility];  Service: Cardiovascular;  Laterality: N/A;   TONSILLECTOMY      Social History:  Social History   Socioeconomic History   Marital status: Single    Spouse name: Not on file   Number of children: 0   Years of education: 12   Highest education level: Not on file  Occupational History   Occupation: Unemployed    Employer: UNEMPLOYED  Tobacco Use   Smoking status: Some Days    Packs/day: 0.10    Years: 28.00    Pack years: 2.80    Types: Cigarettes   Smokeless tobacco: Former    Types: Snuff   Tobacco comments:    1 -2 cigs per day  Vaping Use   Vaping Use: Never used  Substance and Sexual Activity   Alcohol use: Yes    Alcohol/week: 6.0 standard drinks    Types: 6 Standard drinks or equivalent per week    Comment: rarely   Drug use: Not  Currently    Comment: cocaine in the past, none currently   Sexual activity: Not on file  Other Topics Concern   Not on file  Social History Narrative   Fun: Designer, fashion/clothing       Lives in [place] with sister.      Unemployed, was working a Land job   Social Determinants of Sales executive: Not on file  Food Insecurity: Not on file  Transportation Needs: Not on file  Physical Activity: Not on file  Stress: Not on file  Social Connections: Not on file  Intimate Partner Violence: Not on file    Family History:  Family History  Problem Relation Age of Onset   Diabetes Mother    Heart attack Father 68   Stroke Maternal Grandmother    Stroke Paternal Grandmother    Diabetes Paternal Grandfather     Medications:   Current Outpatient Medications on File Prior to Visit  Medication Sig Dispense Refill   Accu-Chek Softclix Lancets lancets TEST UP TO 4 TIMES A DAY 100 each 2   amLODipine (NORVASC) 10 MG tablet Take 1 tablet (10 mg total) by mouth daily. 30 tablet 11   amphetamine-dextroamphetamine (ADDERALL XR) 30 MG 24 hr capsule Take 1 capsule (30 mg total) by mouth daily as needed (focus). 1 daily as needed 30 capsule 0   apixaban (ELIQUIS) 5 MG TABS tablet Take 1 tablet (5 mg total) by mouth 2 (two) times daily. 90 tablet 3   aspirin EC 81 MG EC tablet Take 1 tablet (81 mg total) by mouth daily. Swallow whole. 30 tablet 11   atorvastatin (LIPITOR) 80 MG tablet Take 1 tablet (80 mg total) by mouth daily. 90 tablet 3   blood glucose meter kit and supplies KIT Dispense based on patient and insurance preference. Use up to four times daily as directed. 1 each 0   Blood Pressure Monitoring (BLOOD PRESSURE MONITOR/L CUFF) MISC 1 Units by Does not apply route daily. 1 each 0   Dulaglutide (TRULICITY) 1.5 BM/2.1JD SOPN Inject 1.5 mg into the skin once a week. 0.5 mL 1   flecainide (TAMBOCOR) 100 MG tablet Take 1 tablet (100 mg total) by mouth 2 (two) times daily. 60  tablet 2   metFORMIN (GLUCOPHAGE) 1000 MG tablet Take 1 tablet (1,000 mg total) by mouth daily with breakfast for 7 days, THEN 1 tablet (1,000 mg total) 2 (two) times daily with a meal. 60 tablet 1   metoprolol succinate (  TOPROL XL) 100 MG 24 hr tablet Take 1 tablet (100 mg total) by mouth daily. Take with or immediately following a meal. 30 tablet 11   modafinil (PROVIGIL) 200 MG tablet Take 1 tablet (200 mg total) by mouth daily. 30 tablet 0   nicotine (NICODERM CQ - DOSED IN MG/24 HOURS) 21 mg/24hr patch PLACE 1 PATCH (21 MG TOTAL) ONTO THE SKIN DAILY. 28 patch 1   nicotine (NICODERM CQ) 21 mg/24hr patch Place 1 patch (21 mg total) onto the skin daily. 28 patch 0   pantoprazole (PROTONIX) 40 MG tablet Take 1 tablet (40 mg total) by mouth daily. 30 tablet 5   No current facility-administered medications on file prior to visit.    Allergies:   Allergies  Allergen Reactions   Shrimp [Shellfish Allergy] Anaphylaxis   Banana Other (See Comments)    Pt gags   Watermelon Flavor Nausea And Vomiting   Almond Oil Itching    Roof of mouth itches   Other Itching    Grapes cause itching   Peanut-Containing Drug Products Itching and Cough   Sulfa Antibiotics Itching      OBJECTIVE:  Physical Exam  There were no vitals filed for this visit.   There is no height or weight on file to calculate BMI. No results found.  General: Morbidly obese pleasant middle-aged African-American male, seated, in no evident distress Head: head normocephalic and atraumatic.   Neck: supple with no carotid or supraclavicular bruits Cardiovascular: regular rate and rhythm, no murmurs Musculoskeletal: no deformity Skin:  no rash/petichiae Vascular:  Normal pulses all extremities   Neurologic Exam Mental Status: Awake and fully alert. Mild dysarthria with mild baseline stuttering.  Oriented to place and time. Recent memory impaired and remote memory intact. Attention span, concentration and fund of knowledge  appropriate during visit. Mood and affect flat.  Cranial Nerves: Pupils equal, briskly reactive to light. Extraocular movements full without nystagmus. Visual fields full to confrontation. Hearing intact. Facial sensation intact. R lower facial weakness. Tongue and palate moves normally and symmetrically.  Motor: Normal bulk and tone. Normal strength in all tested extremity muscles except slight weakness right grip strength. Sensory.: intact to touch , pinprick , position and vibratory sensation.  Coordination: Rapid alternating movements normal in all extremities except decreased right hand dexterity. Finger-to-nose and heel-to-shin performed accurately bilaterally. Gait and Station: Arises from chair without difficulty. Stance is normal. Gait demonstrates  shortened stride length and step height with mild unsteadiness and imbalance without use of assistive device.  Tandem walk and heel toe not attempted. Reflexes: 1+ and symmetric. Toes downgoing.         ASSESSMENT: [name]. is a 50 y.o. year old male with history of left caudate head stroke likely in setting of known AF with Xarelto noncompliance 3/50/0938, multiple embolic infarcts within L pons, left temporal lobe and left lateral splenium of corpus callosum 11/19/2020 likely in setting of known AF despite Xarelto compliance therefore switched to Eliquis and recent right pontine, left frontal WM and left temporal WM infarcts 04/14/2021 likely in setting of synchronized small vessel disease although cardioembolic source cannot be completely ruled out. Vascular risk factors include A. Fib, HTN, HLD, DM, OSA with CPAP intolerance, morbid obesity, current tobacco use, cocaine use and EtOH use.      PLAN:  Multiple recurrent strokes:  Residual deficit: Right hand weakness, gait impairment with imbalance, dysarthria,cognitive impairment and subjective dysphagia MBS 05/05/2021 normal oropharyngeal swallow and maintained regular  solids  and thin liquids  TEE 05/20/2021 EF 60 to 65% with normal LV function although severe LV hypertrophy; no evidence of thrombus or PFO Hypercoagulable labs: Slightly elevated anticardiolipin IgM 14 (goal 0-12) and homocystine 15.5 (goal 0-14.5). DRVVT 63.3 (0-47) although DRV VT confirm 1.0 (0.8-1.2) Continue aspirin 81 mg daily and Eliquis (apixaban) daily  and atorvastatin 80 mg daily for secondary stroke prevention Discussed secondary stroke prevention measures and importance of close PCP follow up for aggressive stroke risk factor management  Atrial fibrillation:  Continue Eliquis 5 mg twice daily for CHA2DS2-VASc score of at least 4 In setting of recent stroke, likely Xarelto failure therefore switched to Eliquis 5 mg twice daily Has not had recent follow-up with cardiology -advised patient to contact office to schedule follow-up visit HTN: BP goal <130/90.  Uncontrolled on current regimen -encouraged monitoring at home as BP may be elevated with todays visit -he was encouraged to reschedule follow-up with PCP/cardiology for further evaluation HLD: LDL goal <70.  Prior LDL 49 (04/2021) on atorvastatin 80 mg daily monitored and managed by PCP DMII: A1c goal<7.0. Recent A1c 9.0 up from 8.2 currently being followed by PCP OSA, narcolepsy: Followed by Dr. Annamaria Boots.  Reports compliance with CPAP after recent stroke Tobacco abuse: Discussed importance of complete tobacco cessation -current use nicotine patches Cocaine abuse: UDS positive 04/2021.  Discussed importance of complete abstinence due to increased risk of additional strokes and other health related concerns with recurrent use -he verbalizes understanding    Follow up in 3 months or call earlier if needed   CC:  [facility] provider: Dr. Halford Chessman, MD     I spent 56 minutes of face-to-face and non-face-to-face time with patient.  This included previsit chart review, lab review, study review, order entry, electronic health  record documentation, and prolonged patient education and discussion regarding recent recurrent stroke and multiple prior strokes with secondary stroke prevention measures and aggressive stroke risk factor management as above, residual deficits and answered all other questions to patients satisfaction   Frann Rider, AGNP-BC  [facility] [address] [place], [place] [address]  Phone [phone] Fax [phone] Note: This document was prepared with digital dictation and possible smart phrase technology. Any transcriptional errors that result from this process are unintentional.

## 2021-07-25 NOTE — Progress Notes (Signed)
Internal Medicine Clinic Attending ? ?Case discussed with Dr. Winters  At the time of the visit.  We reviewed the resident?s history and exam and pertinent patient test results.  I agree with the assessment, diagnosis, and plan of care documented in the resident?s note.  ?

## 2021-07-25 NOTE — Progress Notes (Signed)
Internal Medicine Clinic Attending  Case discussed with Dr. Amponsah  At the time of the visit.  We reviewed the resident's history and exam and pertinent patient test results.  I agree with the assessment, diagnosis, and plan of care documented in the resident's note.  

## 2021-07-28 ENCOUNTER — Telehealth: Payer: Self-pay | Admitting: Internal Medicine

## 2021-07-28 NOTE — Telephone Encounter (Signed)
Pt requesting adderall 30 mg refill  [facility] to [facility] #[facility] tablets  Preferred [facility] is [facility] [address]

## 2021-07-29 ENCOUNTER — Other Ambulatory Visit: Payer: Self-pay | Admitting: Internal Medicine

## 2021-07-29 MED ORDER — AMPHETAMINE-DEXTROAMPHET ER 30 MG PO CP24: 30.0000 mg | ORAL_CAPSULE | Freq: Every day | ORAL | 0 refills | Status: DC | PRN

## 2021-07-29 NOTE — Telephone Encounter (Signed)
Adderall refilled

## 2021-07-29 NOTE — Telephone Encounter (Signed)
Received faxed refill request from pt called back  Medication name/strength/dose: adderall xr 30 mg  Medication last rx'd: 07/05/2021 Quantity and number of refills last rx'd: #30 with no refills Instructions: take one tablet daily as needed  Last OV: 05/23/2021 Next OV: 12/09/2021  CY  please advise on refill request  Allergies  Allergen Reactions   Shrimp [Shellfish Allergy] Anaphylaxis   Banana Other (See Comments)    Pt gags   Watermelon Flavor Nausea And Vomiting   Almond Oil Itching    Roof of mouth itches   Other Itching    Grapes cause itching   Peanut-Containing Drug Products Itching and Cough   Sulfa Antibiotics Itching   Current Outpatient Medications on File Prior to Visit  Medication Sig Dispense Refill   Accu-Chek Softclix Lancets lancets TEST UP TO 4 TIMES A DAY 100 each 2   amLODipine (NORVASC) 10 MG tablet Take 1 tablet (10 mg total) by mouth daily. 30 tablet 11   amphetamine-dextroamphetamine (ADDERALL XR) 30 MG 24 hr capsule Take 1 capsule (30 mg total) by mouth daily as needed (focus). 1 daily as needed 30 capsule 0   apixaban (ELIQUIS) 5 MG TABS tablet Take 1 tablet (5 mg total) by mouth 2 (two) times daily. 90 tablet 3   aspirin EC 81 MG EC tablet Take 1 tablet (81 mg total) by mouth daily. Swallow whole. 30 tablet 11   atorvastatin (LIPITOR) 80 MG tablet Take 1 tablet (80 mg total) by mouth daily. 90 tablet 3   blood glucose meter kit and supplies KIT Dispense based on patient and insurance preference. Use up to four times daily as directed. 1 each 0   Blood Pressure Monitoring (BLOOD PRESSURE MONITOR/L CUFF) MISC 1 Units by Does not apply route daily. 1 each 0   Dulaglutide (TRULICITY) 1.5 JH/4.1DE SOPN Inject 1.5 mg into the skin once a week. 0.5 mL 1   flecainide (TAMBOCOR) 100 MG tablet Take 1 tablet (100 mg total) by mouth 2 (two) times daily. 60 tablet 2   metFORMIN (GLUCOPHAGE) 1000 MG tablet Take 1 tablet (1,000 mg total) by mouth daily with  breakfast for 7 days, THEN 1 tablet (1,000 mg total) 2 (two) times daily with a meal. 60 tablet 1   metoprolol succinate (TOPROL XL) 100 MG 24 hr tablet Take 1 tablet (100 mg total) by mouth daily. Take with or immediately following a meal. 30 tablet 11   modafinil (PROVIGIL) 200 MG tablet Take 1 tablet (200 mg total) by mouth daily. 30 tablet 0   nicotine (NICODERM CQ - DOSED IN MG/24 HOURS) 21 mg/24hr patch PLACE 1 PATCH (21 MG TOTAL) ONTO THE SKIN DAILY. 28 patch 1   nicotine (NICODERM CQ) 21 mg/24hr patch Place 1 patch (21 mg total) onto the skin daily. 28 patch 0   pantoprazole (PROTONIX) 40 MG tablet Take 1 tablet (40 mg total) by mouth daily. 30 tablet 5   No current facility-administered medications on file prior to visit.

## 2021-07-30 ENCOUNTER — Other Ambulatory Visit: Payer: Self-pay | Admitting: Internal Medicine

## 2021-08-02 NOTE — Telephone Encounter (Signed)
Called and spoke to pt. Informed him of the refill. Pt verbalized understanding and denied any further questions or concerns at this time.

## 2021-08-03 ENCOUNTER — Encounter: Payer: 59 | Admitting: Dietician

## 2021-08-04 ENCOUNTER — Ambulatory Visit (INDEPENDENT_AMBULATORY_CARE_PROVIDER_SITE_OTHER): Payer: 59 | Admitting: Student

## 2021-08-04 ENCOUNTER — Encounter: Payer: Self-pay | Admitting: Student

## 2021-08-04 ENCOUNTER — Other Ambulatory Visit: Payer: Self-pay

## 2021-08-04 VITALS — BP 141/92 | HR 73 | Temp 98.0°F | Ht 69.0 in | Wt 310.5 lb

## 2021-08-04 DIAGNOSIS — N179 Acute kidney failure, unspecified: Secondary | ICD-10-CM | POA: Diagnosis not present

## 2021-08-04 DIAGNOSIS — Z8673 Personal history of transient ischemic attack (TIA), and cerebral infarction without residual deficits: Secondary | ICD-10-CM

## 2021-08-04 DIAGNOSIS — Z1159 Encounter for screening for other viral diseases: Secondary | ICD-10-CM | POA: Diagnosis not present

## 2021-08-04 DIAGNOSIS — E1165 Type 2 diabetes mellitus with hyperglycemia: Secondary | ICD-10-CM

## 2021-08-04 DIAGNOSIS — I10 Essential (primary) hypertension: Secondary | ICD-10-CM | POA: Diagnosis not present

## 2021-08-04 DIAGNOSIS — N1831 Chronic kidney disease, stage 3a: Secondary | ICD-10-CM

## 2021-08-04 DIAGNOSIS — E118 Type 2 diabetes mellitus with unspecified complications: Secondary | ICD-10-CM

## 2021-08-04 LAB — GLUCOSE, CAPILLARY: Glucose-Capillary: 306 mg/dL — ABNORMAL HIGH (ref 70–99)

## 2021-08-04 MED ORDER — EMPAGLIFLOZIN 25 MG PO TABS: 25.0000 mg | ORAL_TABLET | Freq: Every day | ORAL | 2 refills | Status: DC

## 2021-08-04 MED ORDER — TIRZEPATIDE 2.5 MG/0.5ML ~~LOC~~ SOAJ: 2.5000 mg | SUBCUTANEOUS | 1 refills | Status: DC

## 2021-08-04 MED ORDER — ASPIRIN 81 MG PO TBEC: 81.0000 mg | DELAYED_RELEASE_TABLET | Freq: Every day | ORAL | 1 refills | Status: DC

## 2021-08-04 MED ORDER — METOPROLOL SUCCINATE ER 100 MG PO TB24: 100.0000 mg | ORAL_TABLET | Freq: Every day | ORAL | 1 refills | Status: DC

## 2021-08-04 NOTE — Assessment & Plan Note (Addendum)
Patient's diabetes remains uncontrolled. Reports that his CBGs are in the 200s and sometimes 300s at home. He endorses some polyuria but denies any polydipsia, dizziness, headaches or blurry vision.  Patient's creatinine is now close to the normal range so we will start patient back on Jardiance.  Patient's diabetes has remained uncontrolled while on multiple therapies including Trulicity so we will try patient on tirzepatide for better blood sugar control.   Plan: -- Continue metformin 1000 mg twice daily -- Start tirzepatide (mounjaro) 2.5 mg weekly for 4 weeks, escalate to 5 mg after 4 weeks. -- Restart Jardiance 25 mg daily -- Discontinue Trulicity -- Diabetic foot exam today -- Pending urine microalbumin/creatinine ratio -- Follow-up in 4 weeks

## 2021-08-04 NOTE — Patient Instructions (Addendum)
Thank you, [name] for allowing [place] to provide your care today. Today we discussed your diabetes, blood pressure and kidney function. We are making some changes to your medications to better control your blood sugar.  Continue working on your diet and exercise 2-3 times a week to help with weight loss.   I have ordered the following labs for you:  Lab Orders         Microalbumin / Creatinine Urine Ratio         BMP8+Anion Gap         Hepatitis C antibody         Glucose, capillary       I will call if any are abnormal. All of your labs can be accessed through "My Chart".  I have ordered the following medication/changed the following medications:  Restart Jardiance 25 mg daily Start Terzepatide [facility]), inject 2.5 mg into skin once a week Discontinue Trulicity  My Chart Access: [email or web address]  Please follow-up in 4 weeks  Please make sure to arrive 15 minutes prior to your next appointment. If you arrive late, you may be asked to reschedule.    We look forward to seeing you next time. Please call our clinic at [phone] if you have any questions or concerns. The best time to call is Monday-Friday from 9am-4pm, but there is someone available 24/7. If after hours or the weekend, call the main hospital number and ask for the Internal Medicine Resident On-Call. If you need medication refills, please notify your pharmacy one week in advance and they will send [place] a request.   Thank you for letting us take part in your care. Wishing you the best!  Steffanie Rainwater, MD 08/04/2021, 10:05 AM IM Resident, PGY-2 [name] 41:10

## 2021-08-04 NOTE — Assessment & Plan Note (Signed)
Pending repeat BMP today 

## 2021-08-04 NOTE — Progress Notes (Signed)
   CC: 2 wk follow up  HPI:  [name] is a 50 y.o. male with PMH as below who presents to clinic for 2-week follow-up on his chronic medical problems.. Please see problem based charting for evaluation, assessment and plan.  Past Medical History:  Diagnosis Date   Acute pancreatitis 01/22/2019   Diabetes mellitus    GERD (gastroesophageal reflux disease)    History of alcohol abuse    History of cocaine use    History of echocardiogram    a. Echo 4/14: Moderate LVH, vigorous LVEF, EF 65-70%, normal wall motion, grade 2 diastolic dysfunction, mildly dilated aortic root and ascending aorta, ascending aorta 40 mm, aortic root 38 mm, mild LAE   Hx of cardiovascular stress test    a. GXT 5/14: No ischemic changes  //  b. ETT-Myoview 3/16:  Low risk, no ischemia, EF 58%   Hyperlipidemia    Hypertension    Morbid obesity (HCC)    Paroxysmal atrial fibrillation (HCC)    Occurring in 2008, with several recurrence since then (including in the setting of + cocaine on UDS).   Sleep apnea    uses CPAP   Stroke (HCC)    x3   SVT (supraventricular tachycardia) (HCC)    mid to long RP SVT 09/2019    Review of Systems:  Constitutional: Negative for fever or fatigue. Negative for polydipsia Eyes: Negative for visual changes Respiratory: Negative for shortness of breath Cardiac: Negative for chest pain GU: Positive for polyuria Abdomen: Negative for abdominal pain, constipation or diarrhea Neuro: Negative for headache or weakness  Physical Exam: General: Pleasant, obese middle-age man.  No acute distress. Cardiac: RRR. No murmurs, rubs or gallops. No LE edema Respiratory: Lungs CTAB. No wheezing or crackles. Abdominal: Soft, symmetric and non tender. Normal BS. Skin: Warm, dry and intact without rashes or lesions Extremities: Atraumatic. Full ROM. Palpable radial and DP pulses. Neuro: A&O x 3. Moves all extremities Psych: Appropriate mood and affect.  Vitals:    08/04/21 0921 08/04/21 0937 08/04/21 1000  BP: (!) 148/82 (!) 146/83 (!) 141/92  Pulse: 75 74 73  Temp: 98 F (36.7 C)    TempSrc: Oral    SpO2: 99%    Weight: (!) 310 lb 8 oz (140.8 kg)    Height: 5\' 9"  (1.753 m)       Assessment & Plan:   See Encounters Tab for problem based charting.  Patient discussed with Dr. , MD, MPH

## 2021-08-04 NOTE — Assessment & Plan Note (Signed)
Patient's BP elevated today due to patient running out of his metoprolol since yesterday. He denies any headaches, dizziness or blurry vision. Kidney function continues to improve.  Vitals:   08/04/21 0921 08/04/21 0937 08/04/21 1000  BP: (!) 148/82 (!) 146/83 (!) 141/92   Plan: -- Refill metformin succinate 100 mg daily -- Amlodipine 10 mg daily -- Follow-up BMP -- BP check at next office visit in 4 weeks -- Consider starting patient on ACE/ARB for renal protection after kidney function stabilizes.

## 2021-08-04 NOTE — Assessment & Plan Note (Signed)
Patient has lost 2 pounds in the last 2 weeks.  Patient states he has been struggling with his diet and would like help with eating the right type of food. Patient started on mounjaro for diabetes which will also help with weight loss. -- Pending appointment with diabetic coordinator for nutritional management.

## 2021-08-05 LAB — MICROALBUMIN / CREATININE URINE RATIO
Creatinine, Urine: 68.4 mg/dL
Microalb/Creat Ratio: 286 mg/g creat — ABNORMAL HIGH (ref 0–29)
Microalbumin, Urine: 195.5 ug/mL

## 2021-08-05 LAB — BMP8+ANION GAP
Anion Gap: 17 mmol/L (ref 10.0–18.0)
BUN/Creatinine Ratio: 13 (ref 9–20)
BUN: 20 mg/dL (ref 6–24)
CO2: 22 mmol/L (ref 20–29)
Calcium: 9.3 mg/dL (ref 8.7–10.2)
Chloride: 103 mmol/L (ref 96–106)
Creatinine, Ser: 1.49 mg/dL — ABNORMAL HIGH (ref 0.76–1.27)
Glucose: 308 mg/dL — ABNORMAL HIGH (ref 70–99)
Potassium: 5 mmol/L (ref 3.5–5.2)
Sodium: 142 mmol/L (ref 134–144)
eGFR: 57 mL/min/{1.73_m2} — ABNORMAL LOW (ref >59)

## 2021-08-05 LAB — HEPATITIS C ANTIBODY: Hep C Virus Ab: <0.1 s/co ratio (ref 0.0–0.9)

## 2021-08-05 NOTE — Progress Notes (Signed)
Internal Medicine Clinic Attending  Case discussed with Dr. Amponsah  At the time of the visit.  We reviewed the resident's history and exam and pertinent patient test results.  I agree with the assessment, diagnosis, and plan of care documented in the resident's note.  

## 2021-08-07 ENCOUNTER — Other Ambulatory Visit: Payer: Self-pay | Admitting: Student

## 2021-08-07 DIAGNOSIS — E1165 Type 2 diabetes mellitus with hyperglycemia: Secondary | ICD-10-CM

## 2021-08-08 ENCOUNTER — Ambulatory Visit (INDEPENDENT_AMBULATORY_CARE_PROVIDER_SITE_OTHER): Payer: 59 | Admitting: Ophthalmology

## 2021-08-08 ENCOUNTER — Encounter (INDEPENDENT_AMBULATORY_CARE_PROVIDER_SITE_OTHER): Payer: Self-pay | Admitting: Ophthalmology

## 2021-08-08 ENCOUNTER — Other Ambulatory Visit: Payer: Self-pay

## 2021-08-08 ENCOUNTER — Encounter (INDEPENDENT_AMBULATORY_CARE_PROVIDER_SITE_OTHER): Payer: 59 | Admitting: Ophthalmology

## 2021-08-08 DIAGNOSIS — G4733 Obstructive sleep apnea (adult) (pediatric): Secondary | ICD-10-CM | POA: Diagnosis not present

## 2021-08-08 DIAGNOSIS — H35033 Hypertensive retinopathy, bilateral: Secondary | ICD-10-CM

## 2021-08-08 DIAGNOSIS — E113493 Type 2 diabetes mellitus with severe nonproliferative diabetic retinopathy without macular edema, bilateral: Secondary | ICD-10-CM | POA: Diagnosis not present

## 2021-08-08 LAB — HM DIABETES EYE EXAM

## 2021-08-08 NOTE — Progress Notes (Signed)
08/08/2021     CHIEF COMPLAINT Patient presents for  Chief Complaint  Patient presents with   Retina Follow Up      HISTORY OF PRESENT ILLNESS: [name]. is a 50 y.o. male who presents to the clinic today for:   HPI     Retina Follow Up   Patient presents with  Diabetic Retinopathy.  In both eyes.  This started 4 months ago.  Severity is severe.  Duration of 4 months.  Since onset it is stable.        Comments   4 month f/u OU with OCT and FP  Pt states he was taken off of a few medications recently. States that when he was previously taking these medications, his eyes would feel like they were dilated around mid-day, everything was very bright for him. This has since resolved since stopping these medications.      Last edited by Reather Littler, COA on 08/08/2021  9:00 AM.      Referring physician: [name], MD [place],  [place] [address]  HISTORICAL INFORMATION:   Selected notes from the MEDICAL RECORD NUMBER    Lab Results  Component Value Date   HGBA1C 9.0 (A) 07/14/2021     CURRENT MEDICATIONS: No current outpatient medications on file. (Ophthalmic Drugs)   No current facility-administered medications for this visit. (Ophthalmic Drugs)   Current Outpatient Medications (Other)  Medication Sig   Accu-Chek Softclix Lancets lancets TEST UP TO 4 TIMES A DAY   amLODipine (NORVASC) 10 MG tablet Take 1 tablet (10 mg total) by mouth daily.   amphetamine-dextroamphetamine (ADDERALL XR) 30 MG 24 hr capsule Take 1 capsule (30 mg total) by mouth daily as needed (focus). 1 daily as needed   apixaban (ELIQUIS) 5 MG TABS tablet Take 1 tablet (5 mg total) by mouth 2 (two) times daily.   aspirin 81 MG EC tablet Take 1 tablet (81 mg total) by mouth daily. Swallow whole.   atorvastatin (LIPITOR) 80 MG tablet Take 1 tablet (80 mg total) by mouth daily.   blood glucose meter kit and supplies KIT Dispense based on patient and insurance  preference. Use up to four times daily as directed.   Blood Pressure Monitoring (BLOOD PRESSURE MONITOR/L CUFF) MISC 1 Units by Does not apply route daily.   empagliflozin (JARDIANCE) 25 MG TABS tablet Take 1 tablet (25 mg total) by mouth daily.   flecainide (TAMBOCOR) 100 MG tablet Take 1 tablet (100 mg total) by mouth 2 (two) times daily.   metFORMIN (GLUCOPHAGE) 1000 MG tablet Take 1 tablet (1,000 mg total) by mouth daily with breakfast for 7 days, THEN 1 tablet (1,000 mg total) 2 (two) times daily with a meal.   metoprolol succinate (TOPROL XL) 100 MG 24 hr tablet Take 1 tablet (100 mg total) by mouth daily. Take with or immediately following a meal.   modafinil (PROVIGIL) 200 MG tablet Take 1 tablet (200 mg total) by mouth daily.   nicotine (NICODERM CQ - DOSED IN MG/24 HOURS) 21 mg/24hr patch PLACE 1 PATCH (21 MG TOTAL) ONTO THE SKIN DAILY.   nicotine (NICODERM CQ) 21 mg/24hr patch Place 1 patch (21 mg total) onto the skin daily.   pantoprazole (PROTONIX) 40 MG tablet Take 1 tablet (40 mg total) by mouth daily.   tirzepatide [facility]) 2.5 MG/0.5ML Pen Inject 2.5 mg into the skin once a week.   No current facility-administered medications for this visit. (Other)  REVIEW OF SYSTEMS:    ALLERGIES Allergies  Allergen Reactions   Shrimp [Shellfish Allergy] Anaphylaxis   Banana Other (See Comments)    Pt gags   Watermelon Flavor Nausea And Vomiting   Almond Oil Itching    Roof of mouth itches   Other Itching    Grapes cause itching   Peanut-Containing Drug Products Itching and Cough   Sulfa Antibiotics Itching    PAST MEDICAL HISTORY Past Medical History:  Diagnosis Date   Acute pancreatitis 01/22/2019   Diabetes mellitus    GERD (gastroesophageal reflux disease)    History of alcohol abuse    History of cocaine use    History of echocardiogram    a. Echo 4/14: Moderate LVH, vigorous LVEF, EF 65-70%, normal wall motion, grade 2 diastolic dysfunction, mildly dilated  aortic root and ascending aorta, ascending aorta 40 mm, aortic root 38 mm, mild LAE   Hx of cardiovascular stress test    a. GXT 5/14: No ischemic changes  //  b. ETT-Myoview 3/16:  Low risk, no ischemia, EF 58%   Hyperlipidemia    Hypertension    Morbid obesity ([place])    Paroxysmal atrial fibrillation ([place])    Occurring in 2008, with several recurrence since then (including in the setting of + cocaine on UDS).   Sleep apnea    uses CPAP   Stroke ([place])    x3   SVT (supraventricular tachycardia) ([employer])    mid to long RP SVT 09/2019   Past Surgical History:  Procedure Laterality Date   APPENDECTOMY     ATRIAL FIBRILLATION ABLATION N/A 11/27/2019   Procedure: ATRIAL FIBRILLATION ABLATION;  Surgeon: Thompson Grayer, MD;  Location: [place] CV LAB;  Service: Cardiovascular;  Laterality: N/A;   BUBBLE STUDY  05/20/2021   Procedure: BUBBLE STUDY;  Surgeon: Donato Heinz, MD;  Location: [place];  Service: Cardiovascular;;   ROTATOR CUFF REPAIR     SVT ABLATION N/A 11/27/2019   Procedure: SVT ABLATION;  Surgeon: Thompson Grayer, MD;  Location: [place] CV LAB;  Service: Cardiovascular;  Laterality: N/A;   TEE WITHOUT CARDIOVERSION N/A 05/20/2021   Procedure: TRANSESOPHAGEAL ECHOCARDIOGRAM (TEE);  Surgeon: Donato Heinz, MD;  Location: [facility] ENDOSCOPY;  Service: Cardiovascular;  Laterality: N/A;   TONSILLECTOMY      FAMILY HISTORY Family History  Problem Relation Age of Onset   Diabetes Mother    Heart attack Father 57   Stroke Maternal Grandmother    Stroke Paternal Grandmother    Diabetes Paternal Grandfather     SOCIAL HISTORY Social History   Tobacco Use   Smoking status: Some Days    Packs/day: 0.10    Years: 28.00    Pack years: 2.80    Types: Cigarettes   Smokeless tobacco: Former    Types: Snuff   Tobacco comments:    1 -2 cigs per day  Vaping Use   Vaping Use: Never used  Substance Use Topics   Alcohol use: Yes    Alcohol/week: 6.0 standard  drinks    Types: 6 Standard drinks or equivalent per week    Comment: rarely   Drug use: Not Currently    Comment: cocaine in the past, none currently         OPHTHALMIC EXAM:  Base Eye Exam     Visual Acuity (ETDRS)       Right Left   Dist [place] 20/20 -1 20/25 +1         Tonometry (Tonopen,  9:06 AM)       Right Left   Pressure 13 14         Pupils       Pupils Dark Light Shape React APD   Right PERRL 4 4 Round Minimal None   Left PERRL 4 4 Round Minimal None         Visual Fields (Counting fingers)       Left Right   Restrictions Partial outer superior temporal deficiency Partial outer superior temporal deficiency         Extraocular Movement       Right Left    Full, Ortho Full, Ortho         Neuro/Psych     Oriented x3: Yes   Mood/Affect: Normal         Dilation     Both eyes: 1.0% Mydriacyl, 2.5% Phenylephrine @ 9:06 AM           Slit Lamp and Fundus Exam     External Exam       Right Left   External Normal Normal         Slit Lamp Exam       Right Left   Lids/Lashes Normal Normal   Conjunctiva/Sclera White and quiet White and quiet   Cornea Clear Clear   Anterior Chamber Deep and quiet Deep and quiet   Iris Round and reactive Round and reactive   Lens 1+ Cortical cataract 1+ Cortical cataract   Anterior Vitreous Normal Normal         Fundus Exam       Right Left   Posterior Vitreous Normal Normal   Disc Normal Normal   C/D Ratio 0.35 0.35   Macula Normal Normal   Vessels NPDR-Severe, hypertensive retinopathy, Arteriolar narrowing mild NPDR-Severe, hypertensive retinopathy, Arteriolar narrowing   Periphery No cotton-wool spots seen in the posterior pole No cotton-wool spots seen in the posterior pole            IMAGING AND PROCEDURES  Imaging and Procedures for 08/08/21  OCT, Retina - OU - Both Eyes       Right Eye Quality was good. Scan locations included subfoveal. Central Foveal Thickness: 223.  Progression has been stable.   Left Eye Quality was good. Scan locations included subfoveal. Central Foveal Thickness: 215. Progression has been stable.   Notes Mild diffuse thinning OU but no active maculopathy.     Color Fundus Photography Optos - OU - Both Eyes       Right Eye Progression has improved. Disc findings include normal observations. Macula : microaneurysms.   Left Eye Progression has improved. Disc findings include normal observations. Macula : microaneurysms.   Notes Moderate to severe nonproliferative diabetic retinopathy.  No cotton-wool spots seen.             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 without macular edema associated with type 2 diabetes mellitus (HCC)  E11.3493 OCT, Retina - OU - Both Eyes    Color Fundus Photography Optos - OU - Both Eyes    2. Obstructive sleep apnea  G47.33  3. Hypertensive retinopathy of both eyes, grade 2  H35.033       1.  OU, severe NPDR yet no progression.  2.  Reports important changes in his medications with now with less detrimental side effects to his kidneys.  This may have been coincident with some of the retinal vascular findings we were seeing in the past.  3.  Today no cotton-wool spots seen in the posterior pole thus no significant superimposed episodic hypertensive retinopathy currently upon his underlying severe NPDR  Ophthalmic Meds Ordered this visit:  No orders of the defined types were placed in this encounter.      Return in about 9 months (around 05/08/2022)  for DILATE OU, COLOR FP, OCT.  There are no Patient Instructions on file for this visit.   Explained the diagnoses, plan, and follow up with the patient and they expressed understanding.  Patient expressed understanding of the importance of proper follow up care.   Clent Demark Rochanda Harpham [name] Diseases & Surgery of the Retina and Vitreous Retina [facility] Diabetic [place] 08/08/21     Abbreviations: M myopia (nearsighted); A astigmatism; H hyperopia (farsighted); P presbyopia; Mrx spectacle prescription;  CTL contact lenses; OD right eye; OS left eye; OU both eyes  XT exotropia; ET esotropia; PEK punctate epithelial keratitis; PEE punctate epithelial erosions; DES dry eye syndrome; MGD meibomian gland dysfunction; ATs artificial tears; PFAT's preservative free artificial tears; [place] nuclear sclerotic cataract; PSC posterior subcapsular cataract; ERM epi-retinal membrane; PVD posterior vitreous detachment; RD retinal detachment; DM diabetes mellitus; DR diabetic retinopathy; NPDR non-proliferative diabetic retinopathy; PDR proliferative diabetic retinopathy; CSME clinically significant macular edema; DME diabetic macular edema; dbh dot blot hemorrhages; CWS cotton wool spot; POAG primary open angle glaucoma; C/D cup-to-disc ratio; HVF humphrey visual field; GVF goldmann visual field; OCT optical coherence tomography; IOP intraocular pressure; BRVO Branch retinal vein occlusion; CRVO central retinal vein occlusion; CRAO central retinal artery occlusion; BRAO branch retinal artery occlusion; RT retinal tear; SB scleral buckle; PPV pars plana vitrectomy; VH Vitreous hemorrhage; PRP panretinal laser photocoagulation; IVK intravitreal kenalog; VMT vitreomacular traction; MH Macular hole;  NVD neovascularization of the disc; NVE neovascularization elsewhere; AREDS age related eye disease study; ARMD age related macular degeneration; POAG primary open angle glaucoma; EBMD epithelial/anterior basement membrane dystrophy;  ACIOL anterior chamber intraocular lens; IOL intraocular lens; PCIOL posterior chamber intraocular lens; Phaco/IOL phacoemulsification with intraocular lens placement; [place] photorefractive keratectomy; LASIK laser assisted in situ keratomileusis; HTN hypertension; DM diabetes mellitus; COPD chronic obstructive pulmonary disease

## 2021-08-08 NOTE — Assessment & Plan Note (Signed)
Mild findings today, no progression

## 2021-08-08 NOTE — Assessment & Plan Note (Signed)
Patient reports excellent compliance with CPAP, explained importance of continued use while indicated, to minimize retinal hypoxia and nightly hypoxic damage to the optic nerve and macula

## 2021-08-08 NOTE — Assessment & Plan Note (Signed)
Stable OU 

## 2021-08-14 ENCOUNTER — Emergency Department (HOSPITAL_COMMUNITY)
Admission: EM | Admit: 2021-08-14 | Discharge: 2021-08-15 | Disposition: A | Discharge status: Home / Self Care | Payer: 59 | Attending: Emergency Medicine | Admitting: Emergency Medicine

## 2021-08-14 ENCOUNTER — Other Ambulatory Visit: Payer: Self-pay

## 2021-08-14 ENCOUNTER — Encounter (HOSPITAL_COMMUNITY): Payer: Self-pay | Admitting: Emergency Medicine

## 2021-08-14 DIAGNOSIS — K5900 Constipation, unspecified: Secondary | ICD-10-CM | POA: Diagnosis present

## 2021-08-14 DIAGNOSIS — I130 Hypertensive heart and chronic kidney disease with heart failure and stage 1 through stage 4 chronic kidney disease, or unspecified chronic kidney disease: Secondary | ICD-10-CM | POA: Diagnosis not present

## 2021-08-14 DIAGNOSIS — F17210 Nicotine dependence, cigarettes, uncomplicated: Secondary | ICD-10-CM | POA: Insufficient documentation

## 2021-08-14 DIAGNOSIS — E1165 Type 2 diabetes mellitus with hyperglycemia: Secondary | ICD-10-CM | POA: Insufficient documentation

## 2021-08-14 DIAGNOSIS — Z91010 Allergy to peanuts: Secondary | ICD-10-CM | POA: Diagnosis not present

## 2021-08-14 DIAGNOSIS — I4891 Unspecified atrial fibrillation: Secondary | ICD-10-CM | POA: Diagnosis not present

## 2021-08-14 DIAGNOSIS — Z79899 Other long term (current) drug therapy: Secondary | ICD-10-CM | POA: Insufficient documentation

## 2021-08-14 DIAGNOSIS — K30 Functional dyspepsia: Secondary | ICD-10-CM | POA: Diagnosis not present

## 2021-08-14 DIAGNOSIS — I5032 Chronic diastolic (congestive) heart failure: Secondary | ICD-10-CM | POA: Diagnosis not present

## 2021-08-14 DIAGNOSIS — N183 Chronic kidney disease, stage 3 unspecified: Secondary | ICD-10-CM | POA: Diagnosis not present

## 2021-08-14 DIAGNOSIS — Z7982 Long term (current) use of aspirin: Secondary | ICD-10-CM | POA: Diagnosis not present

## 2021-08-14 DIAGNOSIS — Z7984 Long term (current) use of oral hypoglycemic drugs: Secondary | ICD-10-CM | POA: Insufficient documentation

## 2021-08-14 DIAGNOSIS — E11319 Type 2 diabetes mellitus with unspecified diabetic retinopathy without macular edema: Secondary | ICD-10-CM | POA: Diagnosis not present

## 2021-08-14 DIAGNOSIS — E1122 Type 2 diabetes mellitus with diabetic chronic kidney disease: Secondary | ICD-10-CM | POA: Insufficient documentation

## 2021-08-14 DIAGNOSIS — Z7901 Long term (current) use of anticoagulants: Secondary | ICD-10-CM | POA: Diagnosis not present

## 2021-08-14 DIAGNOSIS — R1013 Epigastric pain: Secondary | ICD-10-CM

## 2021-08-14 NOTE — ED Triage Notes (Signed)
Patient reports constipation with mid abdominal tightness and gastric reflux " burping" for 2 days with mild nausea .

## 2021-08-15 ENCOUNTER — Emergency Department (HOSPITAL_COMMUNITY): Payer: 59

## 2021-08-15 LAB — URINALYSIS, ROUTINE W REFLEX MICROSCOPIC
Bacteria, UA: NONE SEEN
Bilirubin Urine: NEGATIVE
Glucose, UA: >=500 mg/dL — AB
Ketones, ur: NEGATIVE mg/dL
Leukocytes,Ua: NEGATIVE
Nitrite: NEGATIVE
Protein, ur: 100 mg/dL — AB
Specific Gravity, Urine: 1.028 (ref 1.005–1.030)
pH: 5 (ref 5.0–8.0)

## 2021-08-15 LAB — COMPREHENSIVE METABOLIC PANEL
ALT: 27 U/L (ref 0–44)
AST: 20 U/L (ref 15–41)
Albumin: 3.4 g/dL — ABNORMAL LOW (ref 3.5–5.0)
Alkaline Phosphatase: 96 U/L (ref 38–126)
Anion gap: 7 (ref 5–15)
BUN: 20 mg/dL (ref 6–20)
CO2: 24 mmol/L (ref 22–32)
Calcium: 8.8 mg/dL — ABNORMAL LOW (ref 8.9–10.3)
Chloride: 104 mmol/L (ref 98–111)
Creatinine, Ser: 1.74 mg/dL — ABNORMAL HIGH (ref 0.61–1.24)
GFR, Estimated: 47 mL/min — ABNORMAL LOW (ref >60)
Glucose, Bld: 223 mg/dL — ABNORMAL HIGH (ref 70–99)
Potassium: 4.8 mmol/L (ref 3.5–5.1)
Sodium: 135 mmol/L (ref 135–145)
Total Bilirubin: 0.7 mg/dL (ref 0.3–1.2)
Total Protein: 7.3 g/dL (ref 6.5–8.1)

## 2021-08-15 LAB — CBC WITH DIFFERENTIAL/PLATELET
Abs Immature Granulocytes: 0.04 10*3/uL (ref 0.00–0.07)
Basophils Absolute: 0.1 10*3/uL (ref 0.0–0.1)
Basophils Relative: 1 %
Eosinophils Absolute: 0.7 10*3/uL — ABNORMAL HIGH (ref 0.0–0.5)
Eosinophils Relative: 8 %
HCT: 40.1 % (ref 39.0–52.0)
Hemoglobin: 13.2 g/dL (ref 13.0–17.0)
Immature Granulocytes: 0 %
Lymphocytes Relative: 21 %
Lymphs Abs: 2 10*3/uL (ref 0.7–4.0)
MCH: 27.9 pg (ref 26.0–34.0)
MCHC: 32.9 g/dL (ref 30.0–36.0)
MCV: 84.8 fL (ref 80.0–100.0)
Monocytes Absolute: 1.1 10*3/uL — ABNORMAL HIGH (ref 0.1–1.0)
Monocytes Relative: 12 %
Neutro Abs: 5.4 10*3/uL (ref 1.7–7.7)
Neutrophils Relative %: 58 %
Platelets: 330 10*3/uL (ref 150–400)
RBC: 4.73 MIL/uL (ref 4.22–5.81)
RDW: 13.9 % (ref 11.5–15.5)
WBC: 9.3 10*3/uL (ref 4.0–10.5)
nRBC: 0 % (ref 0.0–0.2)

## 2021-08-15 LAB — TROPONIN I (HIGH SENSITIVITY)
Troponin I (High Sensitivity): 13 ng/L (ref <18)
Troponin I (High Sensitivity): 11 ng/L (ref <18)

## 2021-08-15 LAB — BETA-HYDROXYBUTYRIC ACID: Beta-Hydroxybutyric Acid: 0.09 mmol/L (ref 0.05–0.27)

## 2021-08-15 LAB — BRAIN NATRIURETIC PEPTIDE: B Natriuretic Peptide: 32.9 pg/mL (ref 0.0–100.0)

## 2021-08-15 LAB — LIPASE, BLOOD: Lipase: 33 U/L (ref 11–51)

## 2021-08-15 LAB — CBG MONITORING, ED: Glucose-Capillary: 178 mg/dL — ABNORMAL HIGH (ref 70–99)

## 2021-08-15 IMAGING — DX DG ABDOMEN ACUTE W/ 1V CHEST
4 series · 4 of 4 positions shown · non-contrast
Comparison: None.

CLINICAL DATA: Mid abdominal pain, burping

EXAM:
DG ABDOMEN ACUTE WITH 1 VIEW CHEST

[chest pa]
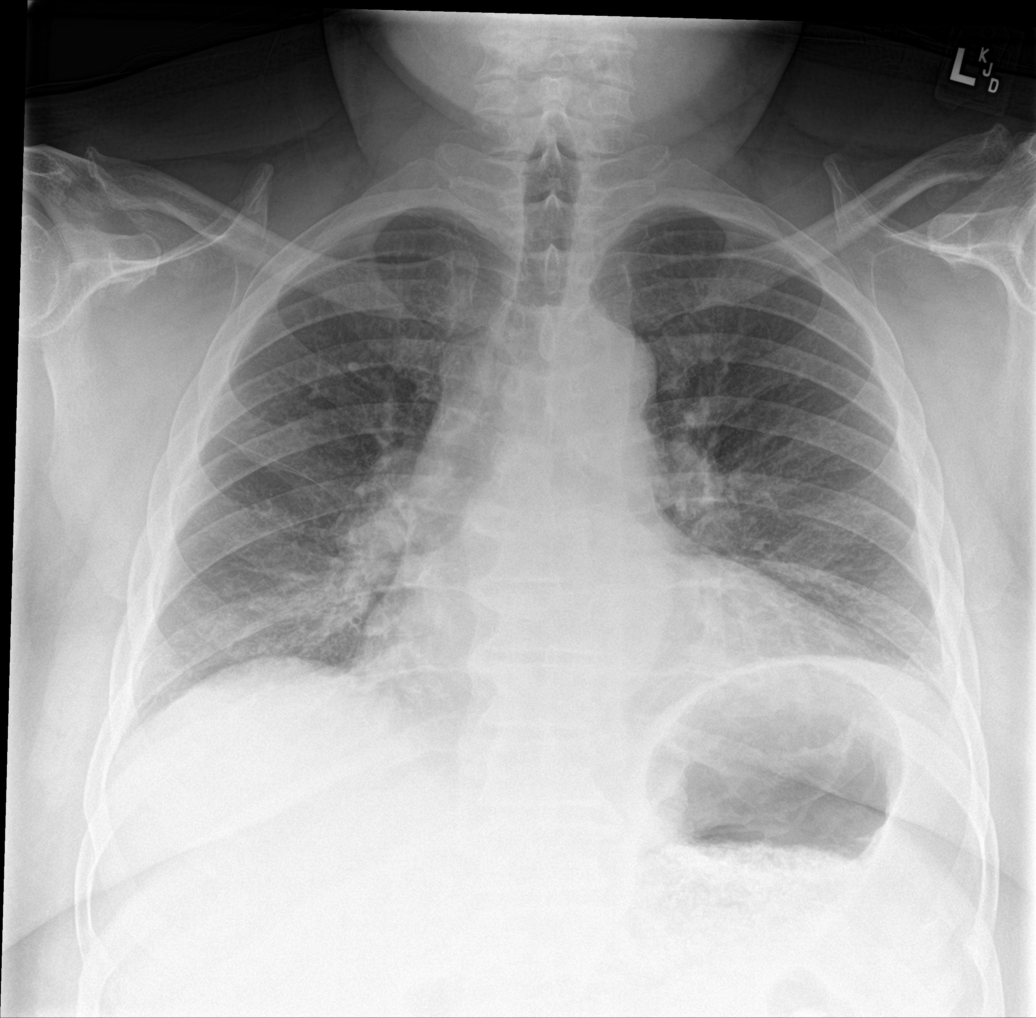

[abdomen erect]
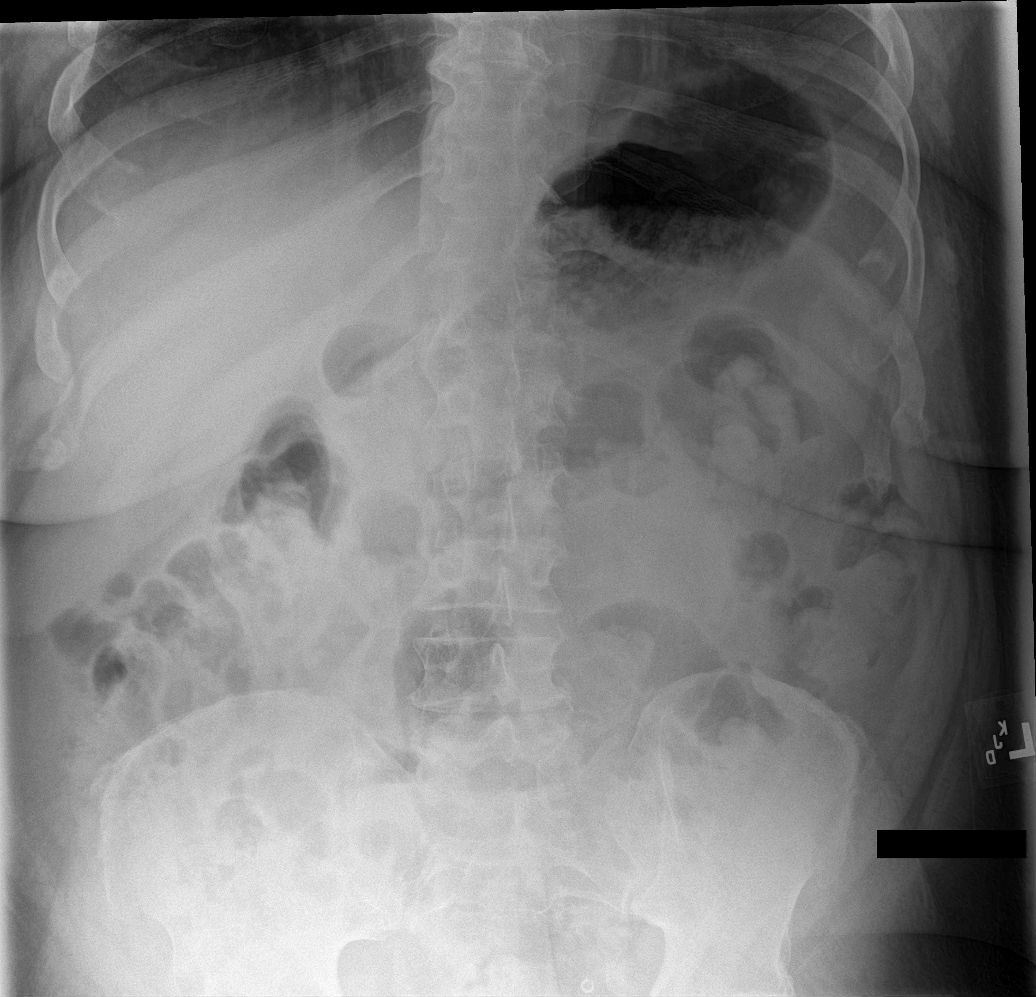

[abdomen supine (1 of 2)]
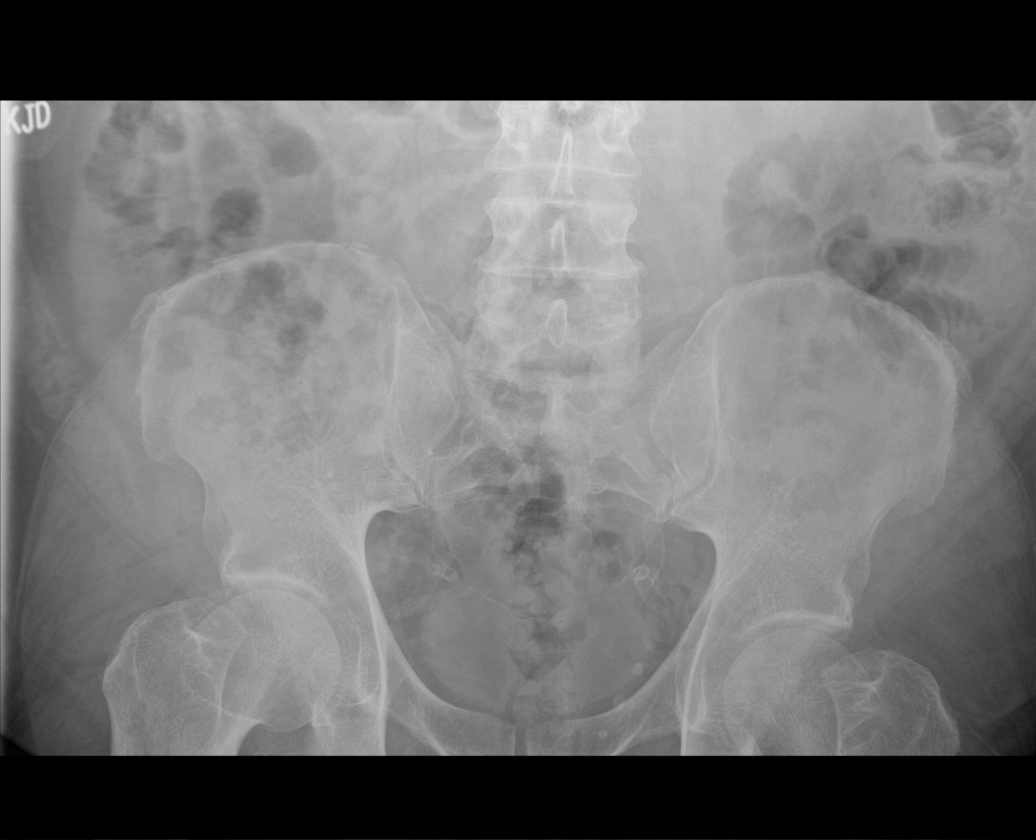

[abdomen supine (2 of 2)]
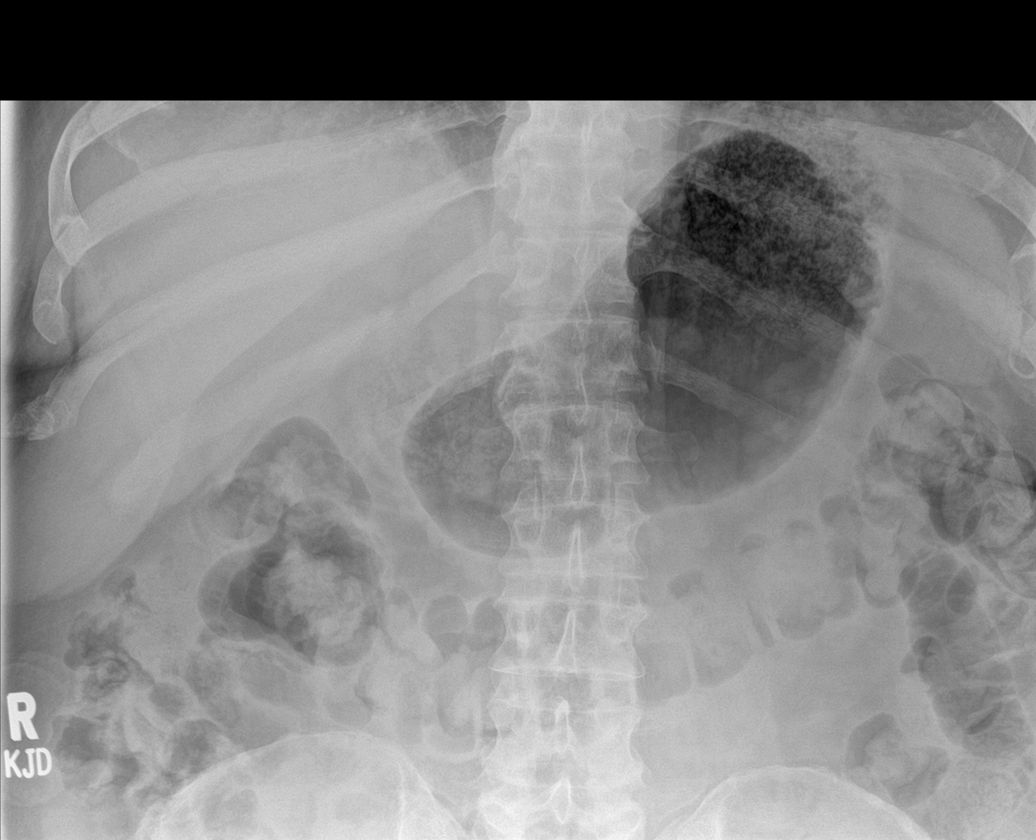

[4 of 4 positions shown; findings below may reference images not displayed]

FINDINGS: There is no evidence of dilated bowel loops or free intraperitoneal
air. Vascular calcifications are noted within the pelvis no
radiopaque calculi or other significant radiographic abnormality is
seen. Heart size and mediastinal contours are within normal limits.
Both lungs are clear.
IMPRESSION: Negative abdominal radiographs.  No acute cardiopulmonary disease.

## 2021-08-15 MED ORDER — POLYETHYLENE GLYCOL 3350 17 G PO PACK
17.0000 g | PACK | Freq: Every day | ORAL | Status: DC
  Administered 2021-08-15: 17 g via ORAL
  Filled 2021-08-15: qty 1

## 2021-08-15 MED ORDER — LIDOCAINE VISCOUS HCL 2 % MT SOLN
15.0000 mL | Freq: Once | OROMUCOSAL | Status: AC
  Administered 2021-08-15: 15 mL via ORAL
  Filled 2021-08-15: qty 15

## 2021-08-15 MED ORDER — ALUM & MAG HYDROXIDE-SIMETH 200-200-20 MG/5ML PO SUSP
30.0000 mL | Freq: Once | ORAL | Status: AC
  Administered 2021-08-15: 30 mL via ORAL
  Filled 2021-08-15: qty 30

## 2021-08-15 NOTE — Discharge Instructions (Addendum)
You were seen here today for evaluation of your constipation and belching.  Additional information on high-fiber diet and constipation added to this discharge paperwork.  Please stay well-hydrated with fluids, mainly water.  Additionally, you need to follow-up with your primary care doctor as your diabetes is not under control as well as you have blood in your urine.  You can take MiraLAX daily to help with constipation.  Continue taking pantoprazole that has been prescribed you previously.  If you have any concern, new or worsening symptoms, please return to the nearest emergency department.

## 2021-08-15 NOTE — ED Notes (Signed)
Pt sitting up in bed, pt requests food, pt denies pain, pt states that he is ready to go home.

## 2021-08-15 NOTE — ED Provider Notes (Signed)
[facility] EMERGENCY DEPARTMENT Provider Note   CSN: [id number] Arrival date & time: 08/14/21  2227     History Chief Complaint  Patient presents with   Abdominal Pain /Constipation     [name]. is a 50 y.o. male to the emergency department for 3 days of acid reflux, constipation, nausea, and belching.  The patient mentions he is having some polyuria, but that said chronic problem for him.  He denies any abdominal pain, mentions its more abdominal bloating.  He denies any vomiting or diarrhea.  Last BM was over 3 days ago.  Patient reports his diabetes medications for recently changed and his metformin dose was lowered.  He denies any polydipsia or fatigue.  He denies any chest pain, shortness of breath, or fevers.  History includes diabetes, hypertension, hyperlipidemia, stroke last year to deficits last year.  Daily medications include Eliquis, metformin, and Jardiance.  Patient reports poor compliance to these. The patient is a daily smoker.  Denies any EtOH or drug use.  HPI     Past Medical History:  Diagnosis Date   Acute pancreatitis 01/22/2019   Diabetes mellitus    GERD (gastroesophageal reflux disease)    History of alcohol abuse    History of cocaine use    History of echocardiogram    a. Echo 4/14: Moderate LVH, vigorous LVEF, EF 65-70%, normal wall motion, grade 2 diastolic dysfunction, mildly dilated aortic root and ascending aorta, ascending aorta 40 mm, aortic root 38 mm, mild LAE   Hx of cardiovascular stress test    a. GXT 5/14: No ischemic changes  //  b. ETT-Myoview 3/16:  Low risk, no ischemia, EF 58%   Hyperlipidemia    Hypertension    Morbid obesity (HCC)    Paroxysmal atrial fibrillation (HCC)    Occurring in 2008, with several recurrence since then (including in the setting of + cocaine on UDS).   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) 03/15/2021   Hypertensive retinopathy of both eyes, grade 2 03/15/2021   Healthcare maintenance 02/23/2021   Right sided weakness 11/19/2020   Acute renal failure superimposed on stage 3 chronic kidney disease (HCC)    Cerebrovascular accident (CVA) (HCC) 06/01/2020   Mediastinal adenopathy 01/06/2020   Morbid obesity due to excess calories (HCC) 12/08/2016   Paroxysmal atrial fibrillation (HCC) 01/23/2013   Type 2 diabetes mellitus with complications (HCC) 01/23/2013   Hypertension 01/23/2013   Tobacco user 01/23/2013   Chronic diastolic heart failure (HCC) 01/21/2013   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   Seasonal and perennial allergic rhinitis 02/13/2008   Dyslipidemia 02/12/2008   Cocaine abuse (HCC) 02/12/2008   Obstructive sleep apnea 02/12/2008    Past Surgical History:  Procedure Laterality Date   APPENDECTOMY     ATRIAL FIBRILLATION ABLATION N/A 11/27/2019   Procedure: ATRIAL FIBRILLATION ABLATION;  Surgeon: [name], MD;  Location: [facility] INVASIVE CV LAB;  Service: Cardiovascular;  Laterality: N/A;   BUBBLE STUDY  05/20/2021   Procedure: BUBBLE STUDY;  Surgeon: Little Ishikawa, MD;  Location: [facility] ENDOSCOPY;  Service: Cardiovascular;;   ROTATOR CUFF REPAIR     SVT ABLATION N/A 11/27/2019  Procedure: SVT ABLATION;  Surgeon: Thompson Grayer, MD;  Location: [facility] CV LAB;  Service: Cardiovascular;  Laterality: N/A;   TEE WITHOUT CARDIOVERSION N/A 05/20/2021   Procedure: TRANSESOPHAGEAL ECHOCARDIOGRAM (TEE);  Surgeon: Donato Heinz, MD;  Location: [facility] ENDOSCOPY;  Service: Cardiovascular;  Laterality: N/A;   TONSILLECTOMY         Family History  Problem Relation Age of Onset   Diabetes Mother    Heart attack Father 29   Stroke Maternal Grandmother    Stroke Paternal  Grandmother    Diabetes Paternal Grandfather     Social History   Tobacco Use   Smoking status: Some Days    Packs/day: 0.10    Years: 28.00    Pack years: 2.80    Types: Cigarettes   Smokeless tobacco: Former    Types: Snuff   Tobacco comments:    1 -2 cigs per day  Vaping Use   Vaping Use: Never used  Substance Use Topics   Alcohol use: Yes    Alcohol/week: 6.0 standard drinks    Types: 6 Standard drinks or equivalent per week    Comment: rarely   Drug use: Not Currently    Comment: cocaine in the past, none currently    Home Medications Prior to Admission medications   Medication Sig Start Date End Date Taking? Authorizing Provider  Accu-Chek Softclix Lancets lancets TEST UP TO 4 TIMES A DAY 02/02/21   Mosetta Anis, MD  amLODipine (NORVASC) 10 MG tablet Take 1 tablet (10 mg total) by mouth daily. 07/14/21 07/14/22  Maudie Mercury, MD  amphetamine-dextroamphetamine (ADDERALL XR) 30 MG 24 hr capsule Take 1 capsule (30 mg total) by mouth daily as needed (focus). 1 daily as needed 07/29/21   Baird Lyons D, MD  apixaban (ELIQUIS) 5 MG TABS tablet Take 1 tablet (5 mg total) by mouth 2 (two) times daily. 07/14/21   Maudie Mercury, MD  aspirin 81 MG EC tablet Take 1 tablet (81 mg total) by mouth daily. Swallow whole. 08/04/21   Lacinda Axon, MD  atorvastatin (LIPITOR) 80 MG tablet Take 1 tablet (80 mg total) by mouth daily. 04/27/21   [name], MD  blood glucose meter kit and supplies KIT Dispense based on patient and insurance preference. Use up to four times daily as directed. 12/29/20   [name], MD  Blood Pressure Monitoring (BLOOD PRESSURE MONITOR/L CUFF) MISC 1 Units by Does not apply route daily. 03/19/19   Tacy Learn, PA-C  empagliflozin (JARDIANCE) 25 MG TABS tablet Take 1 tablet (25 mg total) by mouth daily. 08/04/21   Lacinda Axon, MD  flecainide (TAMBOCOR) 100 MG tablet Take 1 tablet (100 mg total) by mouth 2 (two) times daily. 07/12/21    Allred, Jeneen Rinks, MD  metFORMIN (GLUCOPHAGE) 1000 MG tablet Take 1 tablet (1,000 mg total) by mouth daily with breakfast for 7 days, THEN 1 tablet (1,000 mg total) 2 (two) times daily with a meal. 07/22/21 09/27/21  Lacinda Axon, MD  metoprolol succinate (TOPROL XL) 100 MG 24 hr tablet Take 1 tablet (100 mg total) by mouth daily. Take with or immediately following a meal. 08/04/21 08/04/22  Lacinda Axon, MD  modafinil (PROVIGIL) 200 MG tablet Take 1 tablet (200 mg total) by mouth daily. 03/01/21   Baird Lyons D, MD  nicotine (NICODERM CQ - DOSED IN MG/24 HOURS) 21 mg/24hr patch PLACE 1 PATCH (21 MG TOTAL) ONTO THE SKIN DAILY. 12/20/20   Gaylan Gerold, DO  nicotine (NICODERM CQ) 21 mg/24hr patch Place 1 patch (21 mg total) onto the skin daily. 06/02/21   Baird Lyons D, MD  pantoprazole (PROTONIX) 40 MG tablet Take 1 tablet (40 mg total) by mouth daily. 12/29/20   [name], MD  tirzepatide [facility]) 2.5 MG/0.5ML Pen Inject 2.5 mg into the skin once a week. 08/04/21   Lacinda Axon, MD    Allergies    Shrimp [shellfish allergy], Banana, Watermelon flavor, Almond oil, Other, Peanut-containing drug products, and Sulfa antibiotics  Review of Systems   Review of Systems  Constitutional:  Negative for chills and fever.  HENT:  Negative for congestion, ear pain, rhinorrhea and sore throat.   Eyes:  Negative for pain and visual disturbance.  Respiratory:  Negative for cough, chest tightness and shortness of breath.   Cardiovascular:  Negative for chest pain and palpitations.  Gastrointestinal:  Positive for abdominal distention, constipation and nausea. Negative for abdominal pain and vomiting.       Belching  Endocrine: Positive for polyuria. Negative for polydipsia and polyphagia.  Genitourinary:  Negative for dysuria, flank pain and hematuria.  Musculoskeletal:  Negative for arthralgias and back pain.  Skin:  Negative for color change and rash.  Neurological:  Negative  for seizures and syncope.  All other systems reviewed and are negative.  Physical Exam Updated Vital Signs BP (!) 139/94   Pulse 84   Temp 97.8 F (36.6 C) (Oral)   Resp 19   Ht $R'5\' 9"'mZ$  (1.753 m)   Wt (!) 155 kg   SpO2 100%   BMI 50.46 kg/m   Physical Exam Vitals and nursing note reviewed.  Constitutional:      General: He is not in acute distress.    Appearance: Normal appearance. He is not toxic-appearing.  HENT:     Head: Normocephalic and atraumatic.     Mouth/Throat:     Mouth: Mucous membranes are moist.     Pharynx: No oropharyngeal exudate or posterior oropharyngeal erythema.  Eyes:     General: No scleral icterus. Cardiovascular:     Rate and Rhythm: Normal rate and regular rhythm.  Pulmonary:     Effort: Pulmonary effort is normal. No respiratory distress.     Breath sounds: Normal breath sounds. No wheezing.  Abdominal:     General: Abdomen is protuberant. Bowel sounds are normal. There is distension.     Palpations: Abdomen is soft.     Tenderness: There is no abdominal tenderness. There is right CVA tenderness. There is no left CVA tenderness, guarding or rebound.     Comments: Abdominal exam limited secondary to body habitus  Musculoskeletal:        General: No deformity.     Cervical back: Normal range of motion.  Lymphadenopathy:     Cervical: No cervical adenopathy.  Skin:    General: Skin is warm and dry.  Neurological:     General: No focal deficit present.     Mental Status: He is alert. Mental status is at baseline.     Cranial Nerves: No cranial nerve deficit.     Motor: No weakness.    ED Results / Procedures / Treatments   Labs (all labs ordered are listed, but only abnormal results are displayed) Labs Reviewed  CBC WITH DIFFERENTIAL/PLATELET - Abnormal; Notable for the following components:      Result Value   Monocytes Absolute 1.1 (*)    Eosinophils Absolute 0.7 (*)    All other components within  normal limits  COMPREHENSIVE  METABOLIC PANEL - Abnormal; Notable for the following components:   Glucose, Bld 223 (*)    Creatinine, Ser 1.74 (*)    Calcium 8.8 (*)    Albumin 3.4 (*)    GFR, Estimated 47 (*)    All other components within normal limits  URINALYSIS, ROUTINE W REFLEX MICROSCOPIC - Abnormal; Notable for the following components:   Glucose, UA >=500 (*)    Hgb urine dipstick MODERATE (*)    Protein, ur 100 (*)    All other components within normal limits  CBG MONITORING, ED - Abnormal; Notable for the following components:   Glucose-Capillary 178 (*)    All other components within normal limits  LIPASE, BLOOD  BRAIN NATRIURETIC PEPTIDE  BETA-HYDROXYBUTYRIC ACID  TROPONIN I (HIGH SENSITIVITY)  TROPONIN I (HIGH SENSITIVITY)    EKG EKG Interpretation  Date/Time:  Monday August 15 2021 00:48:16 EDT Ventricular Rate:  74 PR Interval:  210 QRS Duration: 94 QT Interval:  406 QTC Calculation: 450 R Axis:   -38 Text Interpretation: Sinus rhythm with 1st degree A-V block Left axis deviation Septal infarct , age undetermined Abnormal ECG No significant change since last tracing Confirmed by Calvert Cantor [phone]) on 08/15/2021 9:57:14 AM  Radiology DG Abdomen Acute W/Chest  Result Date: 08/15/2021 CLINICAL DATA:  Mid abdominal pain, burping EXAM: DG ABDOMEN ACUTE WITH 1 VIEW CHEST COMPARISON:  None. FINDINGS: There is no evidence of dilated bowel loops or free intraperitoneal air. Vascular calcifications are noted within the pelvis no radiopaque calculi or other significant radiographic abnormality is seen. Heart size and mediastinal contours are within normal limits. Both lungs are clear. IMPRESSION: Negative abdominal radiographs.  No acute cardiopulmonary disease. Electronically Signed   By: Fidela Salisbury [name]   On: 08/15/2021 01:23    Procedures Procedures   Medications Ordered in ED Medications  alum & mag hydroxide-simeth (MAALOX/MYLANTA) 200-200-20 MG/5ML suspension 30 mL (30 mLs Oral  Given 08/15/21 1015)    And  lidocaine (XYLOCAINE) 2 % viscous mouth solution 15 mL (15 mLs Oral Given 08/15/21 1015)    ED Course  I have reviewed the triage vital signs and the nursing notes.  Pertinent labs & imaging results that were available during my care of the patient were reviewed by me and considered in my medical decision making (see chart for details).  Patient presents emergency department for evaluation of abdominal distention, belching, constipation over the past few days.  Differential diagnosis includes but is not limited to SBO, GERD, constipation, ACS, pancreatitis, malignancy.    I personally reviewed and interpreted the patient's labs and imaging.  Patient's troponin was 13 initially down trended to 11.  Lipase negative at 33.  CMP does not show any electrolyte abnormality, but hyperglycemic at 223.  Patient increased creatinine to 1.74 which was consistent with his blood work from a few months ago, but has improved between now and then.  Will recommend patient follow-up with PCP for evaluation of this.  CBC shows no leukocytosis or anemia.  Beta hydroxybutyric acid negative.  BNP negative.  Urinalysis shows greater than 500 glucose in urine with moderate blood.  Again, will advise patient to follow-up with PCP for better management of diabetes and for evaluation of moderate blood in urine.  Abdomen plain film is negative with no acute cardiopulmonary disease.  Patient appears to have a large stool burden.  No stacked coins or haustra visualized.  This is less likely SBO due to DG findings.  Less likely ACS due to negative troponins.  Less likely pancreatitis due to negative lipase.  Less likely DKA based on patient's CMP and negative ketones in urine.  Based on the patient's presentation, this sounds more like dyspepsia over anything cardiac.  Will prescribe patient GI cocktail.  On reevaluation, patient reports distention feeling is improved.  On further discussion, the  patient reports his diet is poor and he does not drink fluids like he should.  He reports he eats a lot of fast food, not a lot of fiber.  Advised him on staying well-hydrated and having a fiber rich diet to help with bowel movements and tell prevent constipation.  I discussed with him the lab and imaging findings.  Recommended he follow-up with his PCP for better management of his diabetes and for further evaluation of the blood in his urine.  He assures me that he will follow-up soon as he has an appointment next week.  Patient take a capful of MiraLAX a day to help with constipation in addition to his dietary changes.  Additional information on dietary changes including the discharge report.  Return precautions given.  Patient agrees to plan.  Patient is stable and being discharged home in good condition.  I discussed this case with my attending physician who cosigned this note including patient's presenting symptoms, physical exam, and planned diagnostics and interventions. Attending physician stated agreement with plan or made changes to plan which were implemented.   MDM Rules/Calculators/A&P                          Final Clinical Impression(s) / ED Diagnoses Final diagnoses:  Constipation, unspecified constipation type  Dyspepsia    Rx / DC Orders ED Discharge Orders     None        Sherrell Puller, PA-C 08/16/21 1753    Truddie Hidden, MD 08/17/21 [phone]

## 2021-08-15 NOTE — ED Provider Notes (Signed)
Emergency Medicine Provider Triage Evaluation Note  [name]. , a 50 y.o. male  was evaluated in triage.  Pt complains of heartburn onset yesterday morning - Sat.  Pt reports reports he cannot stop burping and he feels like he needs to vomit.  Pt reports constipation x 2 days and BM at that time was small and "runny."  Pt denies sick contacts.  Compliant with all other medications.   Pt reports his CBG has been >200 for the last week or more.   Review of Systems  Positive: Burping, nausea, abd distension Negative: CP, SOB,   Physical Exam  BP (!) 121/94   Pulse 75   Temp 98 F (36.7 C)   Resp 16   Ht 5\' 9"  (1.753 m)   Wt (!) 155 kg   SpO2 96%   BMI 50.46 kg/m  Gen:   Awake, no distress, burping    Resp:  Normal effort  MSK:   Moves extremities without difficulty  Other:  Abd soft and nontender  Medical Decision Making  Medically screening exam initiated at 12:42 AM.  Appropriate orders placed.  Tobenna Needs. was informed that the remainder of the evaluation will be completed by another provider, this initial triage assessment does not replace that evaluation, and the importance of remaining in the ED until their evaluation is complete.  Abd distension, burping - labs and imaging pending.    De Burrs, Dierdre Forth 08/15/21 08/17/21    [id number], MD 08/15/21 0600

## 2021-08-17 ENCOUNTER — Telehealth: Payer: Self-pay

## 2021-08-17 DIAGNOSIS — K5900 Constipation, unspecified: Secondary | ICD-10-CM

## 2021-08-17 MED ORDER — POLYETHYLENE GLYCOL 3350 17 G PO PACK: 17.0000 g | PACK | Freq: Every day | ORAL | 0 refills | Status: DC

## 2021-08-17 NOTE — Telephone Encounter (Signed)
Return pt's call - c/o constipation. He went to the ED 08/14/21. Pt asked if he has taken any med - stated a stool softener which has not worked. Stated ED told him to try Miralax in which he has not tried b/c he does not have the money to buy it OTC. Requesting his doctor to send rx to [facility];stated his insurance will pay w/ a rx. Thanks

## 2021-08-17 NOTE — Telephone Encounter (Signed)
Pt was called / informed of Miralax rx.

## 2021-08-17 NOTE — Telephone Encounter (Signed)
Requesting medication for constipation. Please call pt back.

## 2021-08-25 LAB — COLOGUARD: COLOGUARD: NEGATIVE

## 2021-08-26 ENCOUNTER — Other Ambulatory Visit: Payer: Self-pay

## 2021-08-26 DIAGNOSIS — K219 Gastro-esophageal reflux disease without esophagitis: Secondary | ICD-10-CM

## 2021-08-26 MED ORDER — PANTOPRAZOLE SODIUM 40 MG PO TBEC: 40.0000 mg | DELAYED_RELEASE_TABLET | Freq: Every day | ORAL | 3 refills | Status: DC

## 2021-08-31 ENCOUNTER — Other Ambulatory Visit: Payer: Self-pay | Admitting: Internal Medicine

## 2021-08-31 MED ORDER — AMPHETAMINE-DEXTROAMPHET ER 30 MG PO CP24: 30.0000 mg | ORAL_CAPSULE | Freq: Every day | ORAL | 0 refills | Status: DC | PRN

## 2021-08-31 NOTE — Telephone Encounter (Signed)
Adderall refilled

## 2021-08-31 NOTE — Telephone Encounter (Signed)
Noted.   LOV: 05/23/21 Last Filled: 07/29/21  CY please advise if willing to refill. Medication has been pended. Thanks :)

## 2021-09-01 ENCOUNTER — Encounter: Payer: Self-pay | Admitting: Student

## 2021-09-01 ENCOUNTER — Other Ambulatory Visit: Payer: Self-pay

## 2021-09-01 ENCOUNTER — Ambulatory Visit (INDEPENDENT_AMBULATORY_CARE_PROVIDER_SITE_OTHER): Payer: 59 | Admitting: Student

## 2021-09-01 VITALS — BP 143/77 | HR 67 | Temp 98.2°F | Resp 16 | Ht 69.0 in | Wt 312.3 lb

## 2021-09-01 DIAGNOSIS — Z23 Encounter for immunization: Secondary | ICD-10-CM

## 2021-09-01 DIAGNOSIS — I5032 Chronic diastolic (congestive) heart failure: Secondary | ICD-10-CM

## 2021-09-01 DIAGNOSIS — E1165 Type 2 diabetes mellitus with hyperglycemia: Secondary | ICD-10-CM | POA: Diagnosis not present

## 2021-09-01 DIAGNOSIS — E118 Type 2 diabetes mellitus with unspecified complications: Secondary | ICD-10-CM

## 2021-09-01 DIAGNOSIS — G4733 Obstructive sleep apnea (adult) (pediatric): Secondary | ICD-10-CM

## 2021-09-01 DIAGNOSIS — Z0000 Encounter for general adult medical examination without abnormal findings: Secondary | ICD-10-CM

## 2021-09-01 DIAGNOSIS — I110 Hypertensive heart disease with heart failure: Secondary | ICD-10-CM | POA: Diagnosis not present

## 2021-09-01 DIAGNOSIS — I10 Essential (primary) hypertension: Secondary | ICD-10-CM

## 2021-09-01 LAB — GLUCOSE, CAPILLARY: Glucose-Capillary: 274 mg/dL — ABNORMAL HIGH (ref 70–99)

## 2021-09-01 MED ORDER — TIRZEPATIDE 2.5 MG/0.5ML ~~LOC~~ SOAJ: 2.5000 mg | SUBCUTANEOUS | 1 refills | Status: DC

## 2021-09-01 MED ORDER — OLMESARTAN MEDOXOMIL 20 MG PO TABS: 20.0000 mg | ORAL_TABLET | Freq: Every day | ORAL | 0 refills | Status: DC

## 2021-09-01 NOTE — Patient Instructions (Signed)
Douglas Willow Ora., it was a pleasure seeing you today!  Today we discussed: - Blood pressure: I am going to re-start one of your blood pressure medications - olmesartan. Please take this once daily.  - Diabetes: Please STOP taking Trulicity. I have sent in a prescription for tirzepatide instead. Please take 2.5mg  once WEEKLY.  I have ordered the following labs today:  Lab Orders         Glucose, capillary         BMP8+Anion Gap      Follow-up:  2 weeks    Please make sure to arrive 15 minutes prior to your next appointment. If you arrive late, you may be asked to reschedule.   We look forward to seeing you next time. Please call our clinic at [phone] if you have any questions or concerns. The best time to call is Monday-Friday from 9am-4pm, but there is someone available 24/7. If after hours or the weekend, call the main hospital number and ask for the Internal Medicine Resident On-Call. If you need medication refills, please notify your pharmacy one week in advance and they will send [place] a request.  Thank you for letting us take part in your care. Wishing you the best!  Thank you, Evlyn Kanner, MD

## 2021-09-01 NOTE — Progress Notes (Signed)
   CC: regular follow-up  HPI:  [name] is a 50 y.o. with class III obesity, chronic diastolic heart failure, paroxysmal atrial fibrillation, obstructive sleep apnea, type II diabetes mellitus, history of CVA presenting to [facility] for regular follow-up.  Please see problem-based list for further details.  Past Medical History:  Diagnosis Date   Acute pancreatitis 01/22/2019   Diabetes mellitus    GERD (gastroesophageal reflux disease)    History of alcohol abuse    History of cocaine use    History of echocardiogram    a. Echo 4/14: Moderate LVH, vigorous LVEF, EF 65-70%, normal wall motion, grade 2 diastolic dysfunction, mildly dilated aortic root and ascending aorta, ascending aorta 40 mm, aortic root 38 mm, mild LAE   Hx of cardiovascular stress test    a. GXT 5/14: No ischemic changes  //  b. ETT-Myoview 3/16:  Low risk, no ischemia, EF 58%   Hyperlipidemia    Hypertension    Morbid obesity (HCC)    Paroxysmal atrial fibrillation (HCC)    Occurring in 2008, with several recurrence since then (including in the setting of + cocaine on UDS).   Sleep apnea    uses CPAP   Stroke (HCC)    x3   SVT (supraventricular tachycardia) (HCC)    mid to long RP SVT 09/2019   Review of Systems:  As per HPI  Physical Exam:  Vitals:   09/01/21 0921  BP: (!) 143/77  Pulse: 67  Resp: 16  Temp: 98.2 F (36.8 C)  TempSrc: Oral  SpO2: 99%  Weight: (!) 312 lb 4.8 oz (141.7 kg)  Height: 5\' 9"  (1.753 m)   General: Resting comfortably in chair in no acute distress CV: Regular rate, rhythm. No murmurs appreciated. Pulm: Normal work of breathing on room air. Clear to auscultation bilaterally. MSK: Normal bulk, tone. No pitting edema bilateral lower extremities.  Neuro: Awake, alert, conversing appropriately. No focal deficits. Psych: Normal mood, affect, speech.  Assessment & Plan:   See Encounters Tab for problem based charting.  Patient discussed with Dr.  

## 2021-09-02 LAB — BMP8+ANION GAP
Anion Gap: 13 mmol/L (ref 10.0–18.0)
BUN/Creatinine Ratio: 20 (ref 9–20)
BUN: 30 mg/dL — ABNORMAL HIGH (ref 6–24)
CO2: 21 mmol/L (ref 20–29)
Calcium: 8.8 mg/dL (ref 8.7–10.2)
Chloride: 105 mmol/L (ref 96–106)
Creatinine, Ser: 1.52 mg/dL — ABNORMAL HIGH (ref 0.76–1.27)
Glucose: 267 mg/dL — ABNORMAL HIGH (ref 70–99)
Potassium: 4.6 mmol/L (ref 3.5–5.2)
Sodium: 139 mmol/L (ref 134–144)
eGFR: 55 mL/min/{1.73_m2} — ABNORMAL LOW (ref >59)

## 2021-09-03 NOTE — Assessment & Plan Note (Signed)
-   Up to date on cologuard screening - Tdap today

## 2021-09-03 NOTE — Assessment & Plan Note (Signed)
Patient has long history of OSA. He reports today that he recently has not been wearing his CPAP due to discomfort. We have instructed [name] to call the CPAP company in order to obtain nasal pillows.   - Needs nasal pillows for CPAP

## 2021-09-03 NOTE — Assessment & Plan Note (Signed)
BP Readings from Last 3 Encounters:  09/01/21 (!) 143/77  08/15/21 (!) 139/94  08/04/21 (!) 141/92   Blood pressure today elevated. Patient does state he has been compliant with his medications. Denies chest pain, dyspnea, headaches, or dizziness. Previously patient's ARB and thiazide diuretic held in setting of AKI. Today we will re-start olmesartan at lower dose and obtain BMET. If needs additional benefit at next appointment, can consider increasing dose of olmesartan vs re-starting thiazide diuretic (re-start triple-therapy combination pill of amlodipine-olmesartan-HCTZ).  - Continue metoprolol succinate 100mg  daily - Continue amlodipine 10mg  daily - Start olmesartan 20mg  daily - BMET today - Return to clinic for BP check in two weeks

## 2021-09-03 NOTE — Assessment & Plan Note (Signed)
During last visit, patient was switched from Trulicity to tirzepatide. According to the patient, he was unable to pick up tirzepatide and has continued to take Trulicity. Home sugars continue to be elevated to 200-300's. He denies blurry vision, polyruia, or polydipsia.  Discussed with [name] the importance of following up with his medications and communicating with the pharmacy and our clinic if he has troubles with his medications. I have re-ordered tirzepatide for patient to pick up.  - Stop Trulicity - Start tirzepatide 2.5mg  weekly, can increase to 5mg  in four weeks - Continue metformin 1000mg  twice daily and Jardiance 25mg  daily - A1c due in one month

## 2021-09-03 NOTE — Assessment & Plan Note (Signed)
Today patient appears to be at his baseline, appears euvolemic. Weight today 141.7kg. Current GDMT includes metoprolol succinate 100mg  daily. Today we will obtain BMP, re-start olmesartan.  - Continue metoprolol succinate 100mg  - Re-start ARB today

## 2021-09-05 NOTE — Progress Notes (Signed)
Internal Medicine Clinic Attending ? ?Case discussed with Dr. Braswell  At the time of the visit.  We reviewed the resident?s history and exam and pertinent patient test results.  I agree with the assessment, diagnosis, and plan of care documented in the resident?s note.  ?

## 2021-09-12 ENCOUNTER — Encounter: Payer: Self-pay | Admitting: Adult Health

## 2021-09-12 ENCOUNTER — Ambulatory Visit: Payer: 59 | Admitting: Adult Health

## 2021-09-12 VITALS — BP 138/86 | HR 68 | Ht 69.0 in | Wt 315.5 lb

## 2021-09-12 DIAGNOSIS — I10 Essential (primary) hypertension: Secondary | ICD-10-CM

## 2021-09-12 DIAGNOSIS — E1165 Type 2 diabetes mellitus with hyperglycemia: Secondary | ICD-10-CM

## 2021-09-12 DIAGNOSIS — E785 Hyperlipidemia, unspecified: Secondary | ICD-10-CM

## 2021-09-12 DIAGNOSIS — G4733 Obstructive sleep apnea (adult) (pediatric): Secondary | ICD-10-CM

## 2021-09-12 DIAGNOSIS — Z720 Tobacco use: Secondary | ICD-10-CM

## 2021-09-12 DIAGNOSIS — I480 Paroxysmal atrial fibrillation: Secondary | ICD-10-CM

## 2021-09-12 DIAGNOSIS — I639 Cerebral infarction, unspecified: Secondary | ICD-10-CM

## 2021-09-12 NOTE — Patient Instructions (Signed)
Continue aspirin 81 mg daily and Eliquis (apixaban) daily  and atorvastatin  for secondary stroke prevention  Continue to follow up with PCP regarding cholesterol, blood pressure and diabetes management  Maintain strict control of hypertension with blood pressure goal below 130/90, diabetes with hemoglobin A1c goal below 7.0% and cholesterol with LDL cholesterol (bad cholesterol) goal below 70 mg/dL.   Highly encourage complete tobacco cessation as this grealty increases risk of additional strokes and heart disease    Followup in the future with me in 6 months or call earlier if needed       Thank you for coming to see [place] at [facility]. I hope we have been able to provide you high quality care today.  You may receive a patient satisfaction survey over the next few weeks. We would appreciate your feedback and comments so that we may continue to improve ourselves and the health of our patients.

## 2021-09-12 NOTE — Progress Notes (Signed)
[facility] [address] [place]. [name] [address] [phone]       STROKE FOLLOW UP NOTE  [name] Date of Birth:  [birth year] Medical Record Number:  [id number]   Reason for Referral: stroke follow up    SUBJECTIVE:   CHIEF COMPLAINT:  Chief Complaint  Patient presents with   Follow-up    Rm 3 alone here for 3 month f/u reports he has been doing well.       HPI:   Update 09/12/2021 JM: Returns for stroke follow-up after prior visit approximately 4 months ago.    Stable from stroke standpoint without new stroke/TIA symptoms. Chronic right hemiparesis stable  Reports resolution of prior swallowing difficulties - MBS 04/2021 unremarkable He does question ability to return back to driving  Blood pressure today 138/86 - does not routinely monitor at home Prior A1c 9.0 up from 8.2 - recent medication adjustments - managed by PCP Difficulty using CPAP as he feels like he is suffocating but does try to be compliant.  Followed by Dr. Annamaria Boots Continued tobacco use approx 0.5 PPD Denies any additional drug use or EtOH use  No new concerns at this time     History provided for reference purposes only Update 04/19/2021, [name] returns for scheduled stroke follow-up visit but unfortunately had recent stroke on 04/14/2021.  He presented with worsening slurred speech and swallowing difficulty with MRI showing small right pontine, left frontal white matter and left temporal white matter infarcts.  CTA head/neck unremarkable.  LDL 49, TG 210.  A1c 8.2.  UDS positive for cocaine.  2D echo showed EF of 60 to 65%.  Etiology possibly synchronized small vessel disease given locations and uncontrolled risk factors (HTN, DM, tobacco use, OSA and cocaine) although cardioembolic etiology cannot be ruled out given vessel territory.  Recommended continued use of Eliquis as well as adding aspirin 81 mg daily and continuation of atorvastatin 80 mg daily.   Recommended TEE to rule out large PFO or cardiac tumor in setting of recurrent strokes on Eliquis however recommended outpatient due to holiday weekend.  LE venous Doppler negative.  Hypercoagulable labs pending at discharge.  Evaluated by therapies who recommended outpatient PT for decreased mobility and imbalance.   Since discharge, he continues to experience greater swallowing difficulty with thin liquids.  Denies any difficulty with solid foods.  He believes speech has returned back to baseline.  Right-sided weakness and gait stable.  Bedside SLP swallow eval during hospitalization cleared for regular diet and thin liquids as swallowing within functional limits.  He has not yet started PT and questions why he needs this.  He was previously working with neuro's which were completed in April.  He continues to live with his sister who assists with IADLs but is able to maintain ADLs independently. Denies new or worsening stroke/TIA symptoms since discharge  Reports compliance on Eliquis, aspirin and atorvastatin without associated side effects Blood pressure today 170/90 - does not routinely monitor at home  Glucose levels routinely monitored at home and typically greater than 200 (per pt report) -reports compliance on diabetic regimen Routinely follows with pulmonology Dr. Annamaria Boots for sleep apnea and narcolepsy -recent OV ntoe personally reviewed with reported noncompliance for CPAP treatment and plans on repeating split-night study to establish control of OSA - he has not yet completed but does report since recent stroke, he has restarted his CPAP He has tried to smoke cigarettes but reports having difficulty inhaling since recent stroke -  previously smoking 1 pack/day.  Currently using nicotine patch. He denies any additional cocaine use and reports one-time use the week of his stroke - he does not plan on using again in the future    Update 12/14/2020 JM: [name] returns for stroke follow-up  unaccompanied.  He was initially scheduled 11/18/2020 for routine follow-up but left prior to being seen as he was late for appointment.  He was seen by his PCP that day with complaints of 2-day onset of worsening right-sided weakness and dysarthria.  Initially planned on doing further work-up outpatient as symptoms present >48 hours but lab work showed hyperkalemia therefore PCP advised pt to proceed to ED for further management on 11/19/2020.  MRI showed acute infarction in the left pons, right lateral splenium of the corpus callosum and medial left temporal lobe/hippocampus felt likely microembolic with history of A. fib despite compliance on Xarelto.  MRA head/neck unremarkable.  Due to concern of Xarelto failure, switched to Eliquis 5 mg twice daily.  Aspirin 81 mg daily also initiated.  Potassium levels stabilized with medication adjustments and interventions and advised outpatient follow-up with PCP.  Since discharge, reports continued right sided weakness, dysarthria, gait impairment with imbalance, cognitive impairment, and possible dysphagia.  SLP reached out prior to todays visit with pt reporting concern of occasional coughing with water - no difficulty with food but typically difficulty with water - SLP Glendell Docker requested MBS for further evaluation Currently working with neuro rehab PT/OT/SLP He is currently awaiting for rolling walker to help him ambulate longer distances -currently ambulating without assistive device but denies any recent falls Denies new or worsening stroke/TIA symptoms  Reports compliance on Eliquis 5 mg twice daily and aspirin 81 mg daily-denies side effects Reports compliance on atorvastatin 80 mg daily -denies side effects Blood pressure today 160/96 - does not routinely monitor at home but has been elevated during therapy visits Glucose levels not currently monitored at home as he recently ran out of lancets and plans on contacting PCP for refill  He continues to follow  with pulmonology Dr. Annamaria Boots for history of narcolepsy with cataplexy and sleep apnea but plans on pursuing CPAP/BiPAP titration due to difficulty tolerating CPAP.  Titration study scheduled 01/16/2021.   Lab work 11/19/2020 A1c 7.7 (down from 9.9) LDL 102 -  Increased atorvastatin from 40 mg to 80 mg daily  Continued tobacco use approx 1 pack/week with eventual goal of complete cessation  No further concerns at this time   Initial visit 08/09/2020 JM: [name] is being seen for hospital follow-up unaccompanied.  Reports residual mild to moderate speech difficulty, short-term memory impairment, imbalance and visual impairment. Denies residual right sided weakness.  He has not worked with any therapy but PCP placed referral to initiate physical and speech therapy but he has not scheduled visits at this time.  Reports worsening vision post stroke with underlying history of diabetic retinopathy.  Denies new or worsening stroke/TIA symptoms.  He reports ongoing compliance with Xarelto and denies bleeding or bruising.  Reports compliance with atorvastatin and denies myalgias.  Blood pressure today satisfactory at 127/77.  He does not routinely monitor at home.  Glucose level stable per patient.  He does report excessive daytime fatigue but has been noncompliant with CPAP over the past couple months due to increased anxiety and "fear of choking".  He has not further discuss this with his pulmonologist.  Prescribed Adderall for history of narcolepsy but he has not taken since July as he has  not been working although per epic reviewed, pulmonologist recently refilled Adderall prescription.  No further concerns at this time.   Stroke admission 06/01/2020 [name] is a 50 y.o. male with history of atrial fibrillation, OSA on CPAP, narcolepsy, diabetes, hypertension, morbid obesity, GERD, history of polysubstance abuse including cocaine and alcohol  who presented on 06/01/2020 with persistent  dysarthric speech.  Stroke work-up revealed small L caudate body infarct embolic secondary to known AF source not consistently taking Xarelto.  Recommended continuation of Xarelto consistently for secondary stroke prevention.  HTN stable.  LDL 103 and increase atorvastatin to 40 mg daily.  Uncontrolled DM with A1c 9.9.  Other stroke risk factors include tobacco use, hx ETOH and substance abuse, obesity, family history of stroke and OSA on CPAP.  No prior personal history of stroke.  Evaluated by therapy without PT/OT needs but recommended outpatient SLP for residual cognitive impairment and mild to moderate dysarthria  Stroke:   Small L caudate body infarct embolic secondary to known AF source not consistently taking Xarelto CT head focal hyperdensities B corona radiata ? Acute infarct, prominent Small vessel disease and Atrophy.  MRI  Small L caudate body infarct. Small vessel disease. Atrophy.  MR CS Unremarkable  MRA head Unremarkable  MRA neck Unremarkable  2D Echo EF 60-65%. No source of embolus. LA mildly dilated LDL 103 HgbA1c 9.9 VTE prophylaxis - SCDs   Xarelto (rivaroxaban) daily prior to admission, now on aspirin 325 mg daily. Not taking xarelto as prescribed - resume xarelto and stop aspirin  Therapy recommendations:  HH SLP Disposition:  home      ROS:   14 system review of systems performed and negative with exception of those listed in HPI  PMH:  Past Medical History:  Diagnosis Date   Acute pancreatitis 01/22/2019   Diabetes mellitus    GERD (gastroesophageal reflux disease)    History of alcohol abuse    History of cocaine use    History of echocardiogram    a. Echo 4/14: Moderate LVH, vigorous LVEF, EF 65-70%, normal wall motion, grade 2 diastolic dysfunction, mildly dilated aortic root and ascending aorta, ascending aorta 40 mm, aortic root 38 mm, mild LAE   Hx of cardiovascular stress test    a. GXT 5/14: No ischemic changes  //  b. ETT-Myoview 3/16:  Low risk,  no ischemia, EF 58%   Hyperlipidemia    Hypertension    Morbid obesity ([place])    Paroxysmal atrial fibrillation ([place])    Occurring in 2008, with several recurrence since then (including in the setting of + cocaine on UDS).   Sleep apnea    uses CPAP   Stroke ([place])    x3   SVT (supraventricular tachycardia) ([place])    mid to long RP SVT 09/2019    PSH:  Past Surgical History:  Procedure Laterality Date   APPENDECTOMY     ATRIAL FIBRILLATION ABLATION N/A 11/27/2019   Procedure: ATRIAL FIBRILLATION ABLATION;  Surgeon: Thompson Grayer, MD;  Location: [facility];  Service: Cardiovascular;  Laterality: N/A;   BUBBLE STUDY  05/20/2021   Procedure: BUBBLE STUDY;  Surgeon: Donato Heinz, MD;  Location: [facility];  Service: Cardiovascular;;   ROTATOR CUFF REPAIR     SVT ABLATION N/A 11/27/2019   Procedure: SVT ABLATION;  Surgeon: Thompson Grayer, MD;  Location: [facility];  Service: Cardiovascular;  Laterality: N/A;   TEE WITHOUT CARDIOVERSION N/A 05/20/2021   Procedure: TRANSESOPHAGEAL ECHOCARDIOGRAM (TEE);  Surgeon: Donato Heinz, MD;  Location: [facility];  Service: Cardiovascular;  Laterality: N/A;   TONSILLECTOMY      Social History:  Social History   Socioeconomic History   Marital status: Single    Spouse name: Not on file   Number of children: 0   Years of education: 12   Highest education level: Not on file  Occupational History   Occupation: Unemployed    Employer: UNEMPLOYED  Tobacco Use   Smoking status: Some Days    Packs/day: 0.50    Years: 28.00    Pack years: 14.00    Types: Cigarettes   Smokeless tobacco: Former    Types: Snuff   Tobacco comments:    10 cigs per day  Vaping Use   Vaping Use: Never used  Substance and Sexual Activity   Alcohol use: Yes    Alcohol/week: 6.0 standard drinks    Types: 6 Standard drinks or equivalent per week    Comment: rarely   Drug use: Not Currently    Comment: cocaine in the past, none  currently   Sexual activity: Not on file  Other Topics Concern   Not on file  Social History Narrative   Fun: Designer, fashion/clothing       Lives in [place] with sister.      Unemployed, was working a Land job   Social Determinants of Sales executive: Not on file  Food Insecurity: Not on file  Transportation Needs: Not on file  Physical Activity: Not on file  Stress: Not on file  Social Connections: Not on file  Intimate Partner Violence: Not on file    Family History:  Family History  Problem Relation Age of Onset   Diabetes Mother    Heart attack Father 57   Stroke Maternal Grandmother    Stroke Paternal Grandmother    Diabetes Paternal Grandfather     Medications:   Current Outpatient Medications on File Prior to Visit  Medication Sig Dispense Refill   Accu-Chek Softclix Lancets lancets TEST UP TO 4 TIMES A DAY 100 each 2   amLODipine (NORVASC) 10 MG tablet Take 1 tablet (10 mg total) by mouth daily. 30 tablet 11   amphetamine-dextroamphetamine (ADDERALL XR) 30 MG 24 hr capsule Take 1 capsule (30 mg total) by mouth daily as needed (focus). 1 daily as needed 30 capsule 0   apixaban (ELIQUIS) 5 MG TABS tablet Take 1 tablet (5 mg total) by mouth 2 (two) times daily. 90 tablet 3   aspirin 81 MG EC tablet Take 1 tablet (81 mg total) by mouth daily. Swallow whole. 90 tablet 1   atorvastatin (LIPITOR) 80 MG tablet Take 1 tablet (80 mg total) by mouth daily. 90 tablet 3   blood glucose meter kit and supplies KIT Dispense based on patient and insurance preference. Use up to four times daily as directed. 1 each 0   Blood Pressure Monitoring (BLOOD PRESSURE MONITOR/L CUFF) MISC 1 Units by Does not apply route daily. 1 each 0   empagliflozin (JARDIANCE) 25 MG TABS tablet Take 1 tablet (25 mg total) by mouth daily. 30 tablet 2   flecainide (TAMBOCOR) 100 MG tablet Take 1 tablet (100 mg total) by mouth 2 (two) times daily. 60 tablet 2   metFORMIN (GLUCOPHAGE) 1000 MG  tablet Take 1 tablet (1,000 mg total) by mouth daily with breakfast for 7 days, THEN 1 tablet (1,000 mg total) 2 (two) times daily with a meal. 60 tablet  1   metoprolol succinate (TOPROL XL) 100 MG 24 hr tablet Take 1 tablet (100 mg total) by mouth daily. Take with or immediately following a meal. 90 tablet 1   olmesartan (BENICAR) 20 MG tablet Take 1 tablet (20 mg total) by mouth daily. 30 tablet 0   pantoprazole (PROTONIX) 40 MG tablet Take 1 tablet (40 mg total) by mouth daily. 90 tablet 3   polyethylene glycol (MIRALAX) 17 g packet Take 17 g by mouth daily. 14 each 0   tirzepatide (MOUNJARO) 2.5 MG/0.5ML Pen Inject 2.5 mg into the skin once a week. 3 mL 1   modafinil (PROVIGIL) 200 MG tablet Take 1 tablet (200 mg total) by mouth daily. (Patient not taking: Reported on 09/12/2021) 30 tablet 0   nicotine (NICODERM CQ - DOSED IN MG/24 HOURS) 21 mg/24hr patch PLACE 1 PATCH (21 MG TOTAL) ONTO THE SKIN DAILY. (Patient not taking: Reported on 09/12/2021) 28 patch 1   nicotine (NICODERM CQ) 21 mg/24hr patch Place 1 patch (21 mg total) onto the skin daily. (Patient not taking: Reported on 09/12/2021) 28 patch 0   No current facility-administered medications on file prior to visit.    Allergies:   Allergies  Allergen Reactions   Shrimp [Shellfish Allergy] Anaphylaxis   Banana Other (See Comments)    Pt gags   Watermelon Flavor Nausea And Vomiting   Almond Oil Itching    Roof of mouth itches   Other Itching    Grapes cause itching   Peanut-Containing Drug Products Itching and Cough   Sulfa Antibiotics Itching      OBJECTIVE:  Physical Exam  Vitals:   09/12/21 1044  BP: 138/86  Pulse: 68  SpO2: 95%  Weight: (!) 315 lb 8 oz (143.1 kg)  Height: $Remove'5\' 9"'bcjVTsd$  (1.753 m)    Body mass index is 46.59 kg/m. No results found.  General: Morbidly obese pleasant middle-aged African-American male, seated, in no evident distress Head: head normocephalic and atraumatic.   Neck: supple with no  carotid or supraclavicular bruits Cardiovascular: regular rate and rhythm, no murmurs Musculoskeletal: no deformity Skin:  no rash/petichiae Vascular:  Normal pulses all extremities   Neurologic Exam Mental Status: Awake and fully alert. Mild dysarthria with mild baseline stuttering.  Oriented to place and time. Recent memory impaired and remote memory intact. Attention span, concentration and fund of knowledge appropriate during visit. Mood and affect flat.  Cranial Nerves: Pupils equal, briskly reactive to light. Extraocular movements full without nystagmus. Visual fields full to confrontation. Hearing intact. Facial sensation intact. R lower facial weakness. Tongue and palate moves normally and symmetrically.  Motor: Normal bulk and tone. Normal strength in all tested extremity muscles except slight weakness right grip strength. Sensory.: intact to touch , pinprick , position and vibratory sensation.  Coordination: Rapid alternating movements normal in all extremities except decreased right hand dexterity. Finger-to-nose and heel-to-shin performed accurately bilaterally. Gait and Station: Arises from chair without difficulty. Stance is normal. Gait demonstrates  shortened stride length and step height with mild unsteadiness and imbalance without use of assistive device.  Tandem walk and heel toe not attempted. Reflexes: 1+ and symmetric. Toes downgoing.         ASSESSMENT: [name]. is a 50 y.o. year old male with history of left caudate head stroke likely in setting of known AF with Xarelto noncompliance 6/64/4034, multiple embolic infarcts within L pons, left temporal lobe and left lateral splenium of corpus callosum 11/19/2020 likely in setting of known  AF despite Xarelto compliance therefore switched to Eliquis and recent right pontine, left frontal WM and left temporal WM infarcts 04/14/2021 likely in setting of synchronized small vessel disease although cardioembolic source  cannot be completely ruled out. Vascular risk factors include A. Fib, HTN, HLD, DM, OSA with CPAP intolerance, morbid obesity, current tobacco use, cocaine use and EtOH use.      PLAN:  Multiple recurrent strokes:  Residual deficit: Right hand weakness, dysarthria, and mild cognitive impairment  No concern regarding graduated return back to driving from stroke standpoint with minimal residual deficits which should not interfere with driving but did advise him that he will need to further discuss driving with his ophthalmologist Dr. Zadie Rhine for severe retinopathy. If cleared, graduated return to driving basis advised and avoidence of busy roads/highways, long distance travel and night time driving. TEE unremarkable (05/20/2021) Hypercoagulable labs largely unremarkable with some slightly elevated likely clinically insignificant Continue aspirin 81 mg daily and Eliquis (apixaban) daily  and atorvastatin 80 mg daily for secondary stroke prevention Discussed secondary stroke prevention measures and importance of close PCP follow up for aggressive stroke risk factor management  Atrial fibrillation:  Continue Eliquis 5 mg twice daily for CHA2DS2-VASc score of at least 4 In setting of recent stroke, likely Xarelto failure therefore switched to Eliquis 5 mg twice daily Routinely followed by cardiology HTN: BP goal <130/90.  Uncontrolled on current regimen -encouraged monitoring at home as BP may be elevated with todays visit -he was encouraged to reschedule follow-up with PCP/cardiology for further evaluation well-controlled on current regimen per PCP HLD: LDL goal <70. On atorvastatin 80 mg daily monitored and managed by PCP DMII: A1c goal<7.0.  A1c 9.2 up from 8.2. plans on repeat A1c next month.  Managed by PCP OSA, narcolepsy: Followed by Dr. Annamaria Boots.  Reports difficulty with CPAP tolerance - advised to further discuss with Dr. Annamaria Boots Tobacco abuse: Discussed importance of complete tobacco cessation as  continued use greatly increases risk of additional strokes and cardiovascular disease Cocaine abuse: UDS positive 04/2021.  Reports complete cessation since this time    Follow up in 6 months or call earlier if needed   CC:  [name], MD     I spent 37 minutes of face-to-face and non-face-to-face time with patient.  This included previsit chart review, lab review, study review, electronic health record documentation, and patient education and discussion regarding recurrent multiple strokes with secondary stroke prevention measures and aggressive stroke risk factor management as above, residual deficits and answered all other questions to patients satisfaction  Frann Rider, AGNP-BC  [facility] [address] [place], [place] [address]  Phone [phone] Fax [phone] Note: This document was prepared with digital dictation and possible smart phrase technology. Any transcriptional errors that result from this process are unintentional.

## 2021-09-14 ENCOUNTER — Encounter: Payer: 59 | Admitting: Student

## 2021-09-22 ENCOUNTER — Other Ambulatory Visit: Payer: Self-pay | Admitting: Student

## 2021-09-22 ENCOUNTER — Other Ambulatory Visit: Payer: Self-pay

## 2021-09-22 ENCOUNTER — Encounter: Payer: Self-pay | Admitting: Internal Medicine

## 2021-09-22 ENCOUNTER — Ambulatory Visit (INDEPENDENT_AMBULATORY_CARE_PROVIDER_SITE_OTHER): Payer: 59 | Admitting: Internal Medicine

## 2021-09-22 ENCOUNTER — Ambulatory Visit (INDEPENDENT_AMBULATORY_CARE_PROVIDER_SITE_OTHER): Payer: 59 | Admitting: Dietician

## 2021-09-22 DIAGNOSIS — I10 Essential (primary) hypertension: Secondary | ICD-10-CM

## 2021-09-22 DIAGNOSIS — E1165 Type 2 diabetes mellitus with hyperglycemia: Secondary | ICD-10-CM

## 2021-09-22 DIAGNOSIS — E118 Type 2 diabetes mellitus with unspecified complications: Secondary | ICD-10-CM

## 2021-09-22 NOTE — Progress Notes (Signed)
   CC: follow up  HPI:  [name] is a 50 y.o. with medical history as below presenting to [facility] for follow up.  Please see problem-based list for further details, assessments, and plans.  Past Medical History:  Diagnosis Date   Acute pancreatitis 01/22/2019   Diabetes mellitus    GERD (gastroesophageal reflux disease)    History of alcohol abuse    History of cocaine use    History of echocardiogram    a. Echo 4/14: Moderate LVH, vigorous LVEF, EF 65-70%, normal wall motion, grade 2 diastolic dysfunction, mildly dilated aortic root and ascending aorta, ascending aorta 40 mm, aortic root 38 mm, mild LAE   Hx of cardiovascular stress test    a. GXT 5/14: No ischemic changes  //  b. ETT-Myoview 3/16:  Low risk, no ischemia, EF 58%   Hyperlipidemia    Hypertension    Morbid obesity ([facility])    Paroxysmal atrial fibrillation ([facility])    Occurring in 2008, with several recurrence since then (including in the setting of + cocaine on UDS).   Sleep apnea    uses CPAP   Stroke ([facility])    x3   SVT (supraventricular tachycardia) ([facility])    mid to long RP SVT 09/2019   Review of Systems:  Review of system negative unless stated in the problem list or HPI.    Physical Exam:  Vitals:   09/22/21 0915 09/22/21 1025  BP: (!) 164/87 (!) 157/85  Pulse: 71 70  Temp: 98.1 F (36.7 C)   TempSrc: Oral   SpO2: 94%   Weight: (!) 311 lb 14.4 oz (141.5 kg)   Height: 5\' 9"  (1.753 m)     Physical Exam General: Well-developed, well-nourished, appearing stated age. Head: Normocephalic without scalp lesions.  Eyes: Conjunctivae pink, sclerae white, without icterus.  Mouth and Throat: Lips normal color, without lesions. Moist mucus membrane. Neck: Neck supple with full range of motion (ROM).  Lungs: CTAB, no wheeze, rhonchi or rales.  Cardiovascular: Normal heart sounds, no r/m/g, 2+ pulses in all extremities Abdomen: No TTP, normal bowel sounds MSK: No asymmetry or muscle atrophy.  Full range of motion (ROM) of all joints.  Skin: warm, dry good skin turgor, no lesions Neuro: Alert and oriented. CN grossly intact Psych: Normal mood and normal affect   Assessment & Plan:   See Encounters Tab for problem based charting.  Patient seen with Dr. , MD

## 2021-09-22 NOTE — Patient Instructions (Addendum)
Please record the time, amount and what food drinks and activities you have while wearing the continuous glucose monitor (CGM).  Bring the folder with you to follow up appointments. If your monitor falls off, please place it in the bag provided in your folder and bring it back with you to your next appointment.   Do not have a CT or an MRI while wearing the CGM.   1 week visit has been set up with me and a doctor for the first of two CGM downloads.   You will also return in 2 weeks to have your second download and the CGM removed.  Lupita Leash [phone]   Get rid of the sweets in your house- stop buying them.  Instead of a soda consider flavored water... can cut your regular soda with it  Ideas to help with a sweet tooth.... 1-  Eat fruit instead 2- Try a low sugar yogurt like two Good 3- A raisin bread with peanut butter sandwich 4- 2-4 graham crackers with peanut butter  Frozen Dinners okay for you: Healthy Choice  Try to limit eating out to two times a week. Buy most of you food at the grocery store.....   What to drink  How to Stop Sugar Cravings: Tips to Use Right Now If you're craving sugar, here are some ways to tame those cravings.   Go cold [place]. Cutting out all simple sugars works for some people. But it's not easy. "The initial 48 to 72 hours are tough," Gerbstadt says. Some people find that going cold [place] helps curb their cravings after a few days. Others find they may still crave sugar but over time are able to train their taste buds to be satisfied with less.  Reach for fruit. Keep fruit handy for when sugar cravings hit. You'll get fiber and nutrients along with some sweetness. And stock up on foods like nuts, seeds, and dried fruits, says certified addiction specialist Loretta Plume. "Have them handy so you reach for them instead of reaching for the old [sugary] something."  Get up and go. When a sugar craving hits, walk away. "Take a walk around the block  or [do] something to change the scenery," to take your mind off the food you're craving, Elson Areas suggests.   Eat regularly. Waiting too long between meals may set you up to choose sugary, fatty foods that cut your hunger, Moores says. Instead, eating every 3 to 5 hours can help keep blood sugar stable and help you "avoid irrational eating behavior," Grotto says. Your best bets? "Choose protein, fiber-rich foods like whole grains and produce," Moores says.

## 2021-09-22 NOTE — Patient Instructions (Addendum)
Douglas Willow Ora., it was a pleasure seeing you today! You endorsed feeling well today. Below are some of the things we talked about this visit. We look forward to seeing you in the follow up appointment!  Today we discussed: Douglas Willow Ora., it was a pleasure seeing you today! You endorsed feeling well today. Below are some of the things we talked about this visit. We look forward to seeing you in the follow up appointment!  Today we discussed: High Blood Pressure: Your blood pressure was elevated but you did not take your morning medications today. You also do not have a blood pressure cuff and do not check your blood pressure at home. I saw your blood pressure better in the clinic visit at neurology office, so I would like for you to get a blood pressure cuff and record your blood pressure two times a day and write it down. We can look over this and make adjustments at the next visit.  Continuing using your CPAP machine as it will help with your blood pressure as well.  Diabetes: You also came for diabetes follow up. Your reported your sugars to be elevated at 200 in the morning. But you also noted them to be less than 100 some mornings if you don't drink sodas before going to sleep. You stated you are averaging 4 regular sodas per day. If we can start to replace them with the diet then we can prevent increasing your regimen. I would like you to stop drinking sodas or substitute them for diet soda. We will check your A1c next visit and see if your were able to make these adjustments.  Please write down your sugars and bring them to the next visit. At least bring your morning sugar log with you to the next visit.  You will also meet with our diabetic coordinator who will also help you achieve your goal of getting better control of your diabetes.   I have ordered the following labs today:  Lab Orders  No laboratory test(s) ordered today      Referrals ordered today:    Referral Orders  No referral(s) requested today     I have ordered the following medication/changed the following medications:   Stop the following medications: There are no discontinued medications.   Start the following medications: No orders of the defined types were placed in this encounter.    Follow-up: 3 week follow up for blood pressure and diabetes  Please make sure to arrive 15 minutes prior to your next appointment. If you arrive late, you may be asked to reschedule.   We look forward to seeing you next time. Please call our clinic at [phone] if you have any questions or concerns. The best time to call is Monday-Friday from 9am-4pm, but there is someone available 24/7. If after hours or the weekend, call the main hospital number and ask for the Internal Medicine Resident On-Call. If you need medication refills, please notify your pharmacy one week in advance and they will send [place] a request.  Thank you for letting us take part in your care. Wishing you the best!  Thank you, Gwenevere Abbot, MD

## 2021-09-22 NOTE — Progress Notes (Signed)
Medical Nutrition Therapy :  Appt start time: 1000 end time:  1035. Total time: 35 time spent on Medical Nutrition Therapy Assessment and education  Visit # 1  Assessment:  Primary concerns today: meal planning for diabetes and heart health. [name] is referred for diabetes self management training/medical nutrition therapy and professional Continuous glucose monitoring. He says he has seen a dietitian in the past and eating smaller portions of sweets did not work for him. He says he is "addicted to sweets".  He would like to get better so he can get a job. He buys his lown food and says he does not cook but eats sandwiches and knows the bologna and sweets are foods he should not be eating. His weight is stable.  Preferred Learning Style: No preference indicated  Learning Readiness: Contemplating  Documentation for Freestyle Libre Pro Continuous glucose monitoring Freestyle Libre Pro CGM sensor placed today. Patient was educated about wearing sensor, keeping food, activity and medication log and when to call office. Patient was educated about how to care for the sensor and not to have an MRI, CT or Diathermy while wearing the sensor. Follow up was arranged with the patient for 1 week.   Lot #: [id number] Serial #: [id number] Expiration Date: 02/12/2021  [name], RD 09/22/2021 11:48 AM.   ANTHROPOMETRICS:Estimated body mass index is 46.06 kg/m as calculated from the following:   Height as of an earlier encounter on 09/22/21: 5\' 9"  (1.753 m).   Weight as of an earlier encounter on 09/22/21: 311 lb 14.4 oz (141.5 kg).  WEIGHT HISTORY:  Wt Readings from Last 10 Encounters:  09/22/21 (!) 311 lb 14.4 oz (141.5 kg)  09/12/21 (!) 315 lb 8 oz (143.1 kg)  09/01/21 (!) 312 lb 4.8 oz (141.7 kg)  08/14/21 (!) 341 lb 11.4 oz (155 kg)  08/04/21 (!) 310 lb 8 oz (140.8 kg)  07/21/21 (!) 312 lb 12.8 oz (141.9 kg)  07/14/21 (!) 306 lb 9.6 oz (139.1 kg)  06/22/21 (!) 306 lb (138.8 kg)  05/23/21  (!) 308 lb (139.7 kg)  05/20/21 (!) 308 lb (139.7 kg)    SLEEP:gets nightmares when falls asleep with CPAP, but recently doing better  MEDICATIONS: tolerating mounjaro at lowest dose, metformin 1000 mg two times a day, jardiance 25 mg daily BLOOD SUGAR: did not bring meter but says his sugars are in the 200s at hime .  Lab Results  Component Value Date   HGBA1C 9.0 (A) 07/14/2021   HGBA1C 8.2 (H) 04/15/2021   HGBA1C 8.3 (A) 02/23/2021   HGBA1C 7.7 (H) 11/19/2020   HGBA1C 9.9 (H) 06/02/2020     DIETARY INTAKE: Usual eating pattern includes 3 meals and 0-2 snacks per day. Everyday foods include  sweets, soda, ice cream, peanut butter, graham crackers Dining Out (times/week): 2x 24-hr recall:  B ( AM): sausage, egg sandwich and ice cream  L ( PM): bologna sandwich, raisin bread with peanut butter D ( PM): buffet or sandwich Snk ( PM): ice cream 2-3 bowls Beverages: water, regular soda  Usual physical activity: need to assess at future visit  Progress Towards Goal(s):  In progress.   Nutritional Diagnosis:  NB-1.1 Food and nutrition-related knowledge deficit As related to lack of sufficient prior training and support.  As evidenced by his report.questions and food recall.    Intervention:  Nutrition education about Continuous glucose monitoring, how to handle food cravings, how to build a healthy balanced meal. . Action Goal: see patient instructions  Outcome goal: improved knowledge about meal planning for ehart health and diabetes Coordination of care: discussed care with dr Welton Flakes  Teaching Method Utilized: Visual, Auditory,Hands on Handouts given during visit include: After visit summary, cgm folder Barriers to learning/adherence to lifestyle change: competing values, Demonstrated degree of understanding via:  Teach Back   Monitoring/Evaluation:  Dietary intake, exercise, folder and meter, and body weight in 1 week(s). [name], RD 09/22/2021 11:48 AM.

## 2021-09-24 NOTE — Assessment & Plan Note (Addendum)
Patient has diabetes and was recently switched to tirzepatide 2.5 mg weekly from truclicity. Other medications include Jardiance 25 mg and Metformin 1000 mg BID. He reports sugars greater than 200 in the morning. He also reports sugars less than 100 as well. When inquired about the difference, he states he drink an average on 4 regular sodas per day. If he forgets to drink them then his glucose is low in the morning but if he does it is high. He is meeting with diabetic coordinator today after our appointment.   Assessment/Plan: Patient has diabetes and was recently switched to tirzepatide 2.5 mg weekly from truclicity. Other medications include Jardiance 25 mg and Metformin 1000 mg BID. Patient diabetes is uncontrolled 2/2 to dietary indiscretion. Would like to make dietary adjustments as his glucose readings are labile.  -Encouraged lifestyle adjustments with focus on stopping the daily sodas or replacing them with diet sodas initially. -Diabetic coordinator -3 week follow up for repeat A1c

## 2021-09-24 NOTE — Assessment & Plan Note (Addendum)
Assessment/Plan: Patient has hx his blood pressure was elevated today at 164/89 and repeat was 157/85. He stated he did not take his morning medications which included his antihypertensive medications. Looking at his visit with neurology from earlier, the bp was 138/86 which shows better control than today. Patient does not have a blood pressure cuff at home. I believe best course of action is to have patient monitor bp at home and come back in 3 weeks. -3 week follow up for htn with blood pressure log -Adjust based on the bp log.

## 2021-09-26 ENCOUNTER — Emergency Department (HOSPITAL_COMMUNITY)
Admission: EM | Admit: 2021-09-26 | Discharge: 2021-09-26 | Disposition: A | Discharge status: Home / Self Care | Payer: 59 | Attending: Emergency Medicine | Admitting: Emergency Medicine

## 2021-09-26 ENCOUNTER — Encounter (HOSPITAL_COMMUNITY): Payer: Self-pay

## 2021-09-26 ENCOUNTER — Emergency Department (HOSPITAL_COMMUNITY): Payer: 59

## 2021-09-26 DIAGNOSIS — Z91010 Allergy to peanuts: Secondary | ICD-10-CM | POA: Insufficient documentation

## 2021-09-26 DIAGNOSIS — I5032 Chronic diastolic (congestive) heart failure: Secondary | ICD-10-CM | POA: Diagnosis not present

## 2021-09-26 DIAGNOSIS — R1110 Vomiting, unspecified: Secondary | ICD-10-CM | POA: Diagnosis not present

## 2021-09-26 DIAGNOSIS — R0989 Other specified symptoms and signs involving the circulatory and respiratory systems: Secondary | ICD-10-CM | POA: Insufficient documentation

## 2021-09-26 DIAGNOSIS — E119 Type 2 diabetes mellitus without complications: Secondary | ICD-10-CM | POA: Insufficient documentation

## 2021-09-26 DIAGNOSIS — F17210 Nicotine dependence, cigarettes, uncomplicated: Secondary | ICD-10-CM | POA: Diagnosis not present

## 2021-09-26 DIAGNOSIS — T17308A Unspecified foreign body in larynx causing other injury, initial encounter: Secondary | ICD-10-CM

## 2021-09-26 DIAGNOSIS — I110 Hypertensive heart disease with heart failure: Secondary | ICD-10-CM | POA: Insufficient documentation

## 2021-09-26 LAB — CBC WITH DIFFERENTIAL/PLATELET
Abs Immature Granulocytes: 0.07 10*3/uL (ref 0.00–0.07)
Basophils Absolute: 0.1 10*3/uL (ref 0.0–0.1)
Basophils Relative: 1 %
Eosinophils Absolute: 0.7 10*3/uL — ABNORMAL HIGH (ref 0.0–0.5)
Eosinophils Relative: 8 %
HCT: 40.6 % (ref 39.0–52.0)
Hemoglobin: 12.8 g/dL — ABNORMAL LOW (ref 13.0–17.0)
Immature Granulocytes: 1 %
Lymphocytes Relative: 12 %
Lymphs Abs: 1 10*3/uL (ref 0.7–4.0)
MCH: 27.2 pg (ref 26.0–34.0)
MCHC: 31.5 g/dL (ref 30.0–36.0)
MCV: 86.2 fL (ref 80.0–100.0)
Monocytes Absolute: 0.8 10*3/uL (ref 0.1–1.0)
Monocytes Relative: 10 %
Neutro Abs: 5.7 10*3/uL (ref 1.7–7.7)
Neutrophils Relative %: 68 %
Platelets: 310 10*3/uL (ref 150–400)
RBC: 4.71 MIL/uL (ref 4.22–5.81)
RDW: 13.9 % (ref 11.5–15.5)
WBC: 8.3 10*3/uL (ref 4.0–10.5)
nRBC: 0 % (ref 0.0–0.2)

## 2021-09-26 LAB — COMPREHENSIVE METABOLIC PANEL
ALT: 23 U/L (ref 0–44)
AST: 23 U/L (ref 15–41)
Albumin: 3.5 g/dL (ref 3.5–5.0)
Alkaline Phosphatase: 101 U/L (ref 38–126)
Anion gap: 10 (ref 5–15)
BUN: 27 mg/dL — ABNORMAL HIGH (ref 6–20)
CO2: 24 mmol/L (ref 22–32)
Calcium: 8.7 mg/dL — ABNORMAL LOW (ref 8.9–10.3)
Chloride: 105 mmol/L (ref 98–111)
Creatinine, Ser: 1.87 mg/dL — ABNORMAL HIGH (ref 0.61–1.24)
GFR, Estimated: 43 mL/min — ABNORMAL LOW (ref >60)
Glucose, Bld: 212 mg/dL — ABNORMAL HIGH (ref 70–99)
Potassium: 4.9 mmol/L (ref 3.5–5.1)
Sodium: 139 mmol/L (ref 135–145)
Total Bilirubin: 0.6 mg/dL (ref 0.3–1.2)
Total Protein: 7.5 g/dL (ref 6.5–8.1)

## 2021-09-26 LAB — CBG MONITORING, ED: Glucose-Capillary: 148 mg/dL — ABNORMAL HIGH (ref 70–99)

## 2021-09-26 IMAGING — CR DG CHEST 2V
2 series · 2 of 2 positions shown · non-contrast
Comparison: [DATE]

CLINICAL DATA: Choking

EXAM:
CHEST - 2 VIEW

[chest pa]
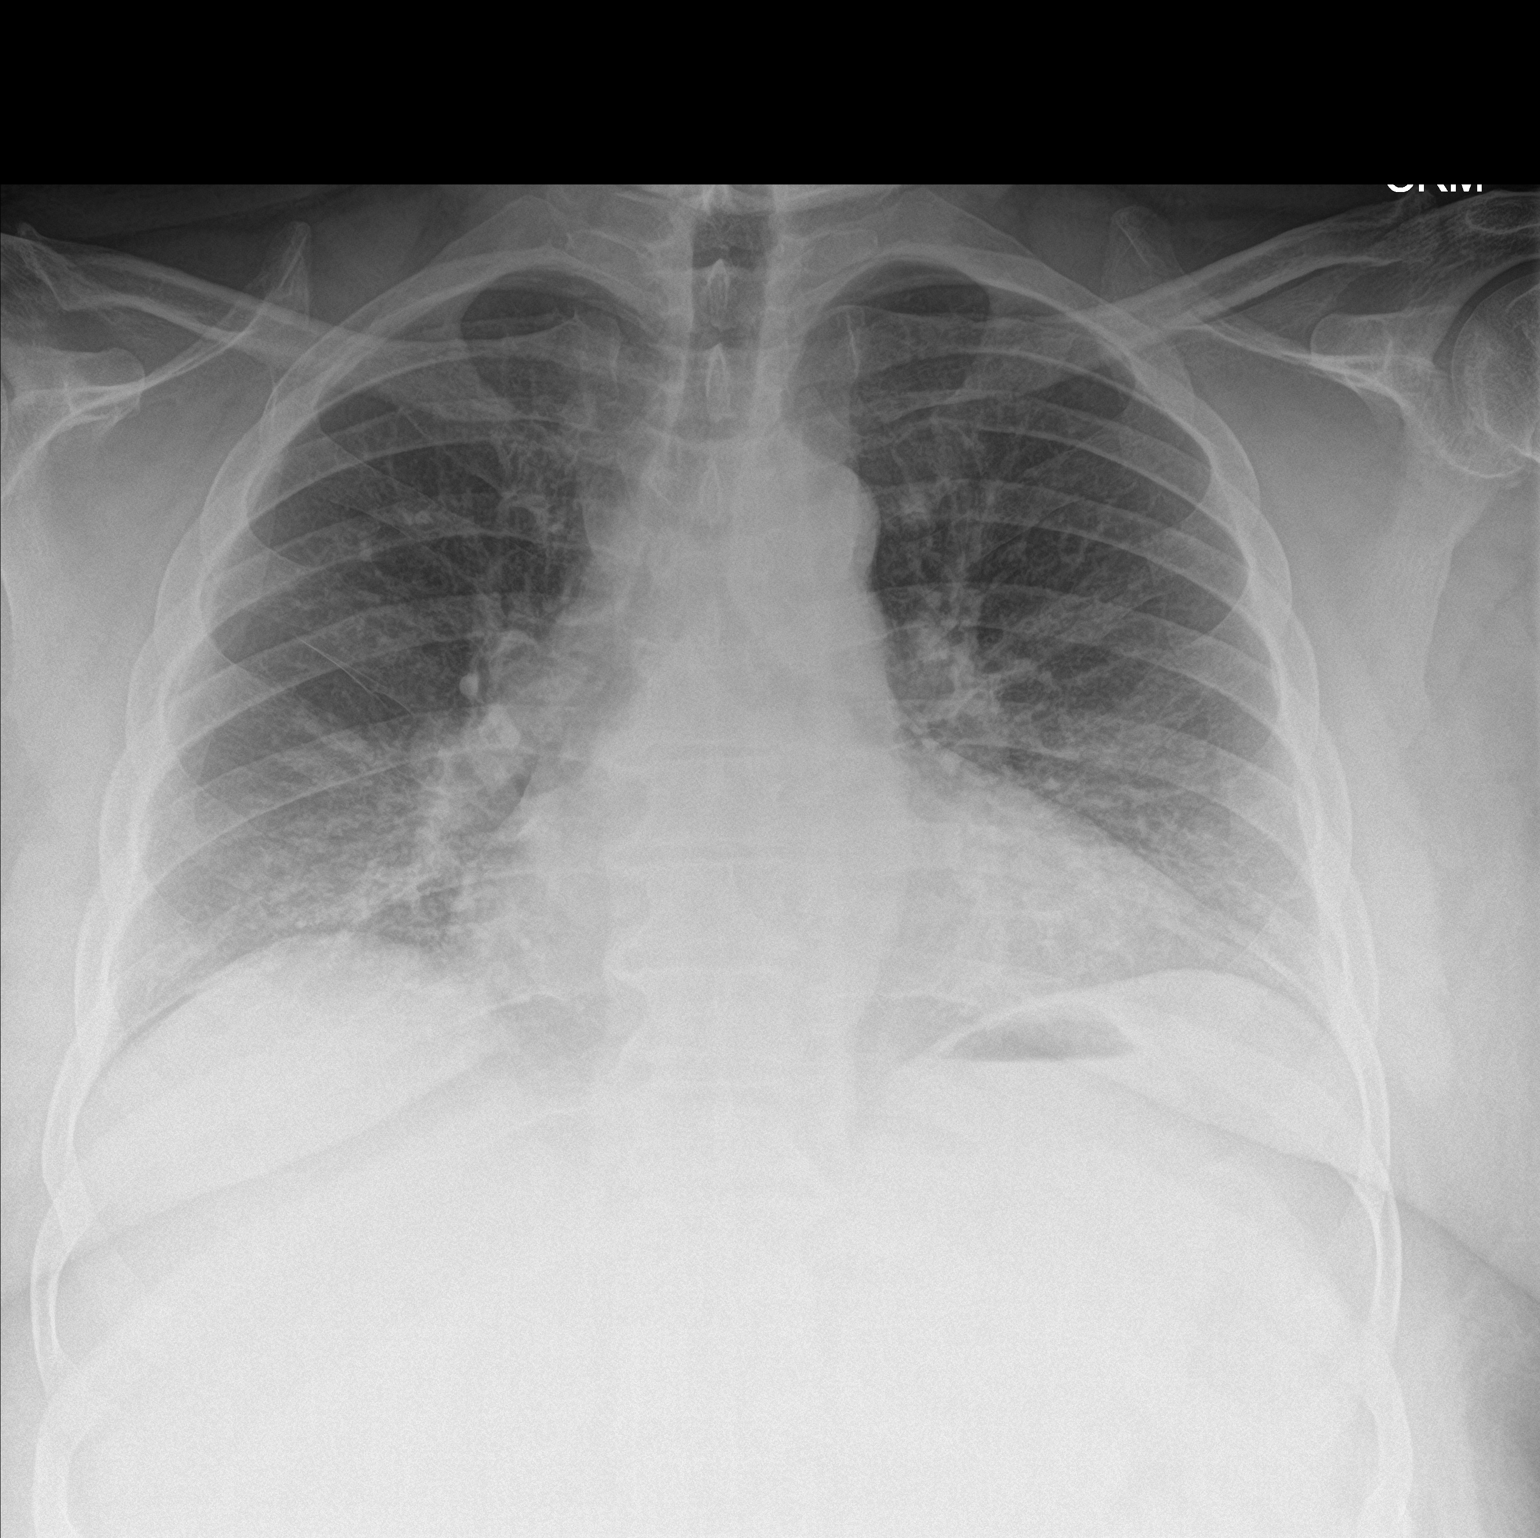

[chest lat]
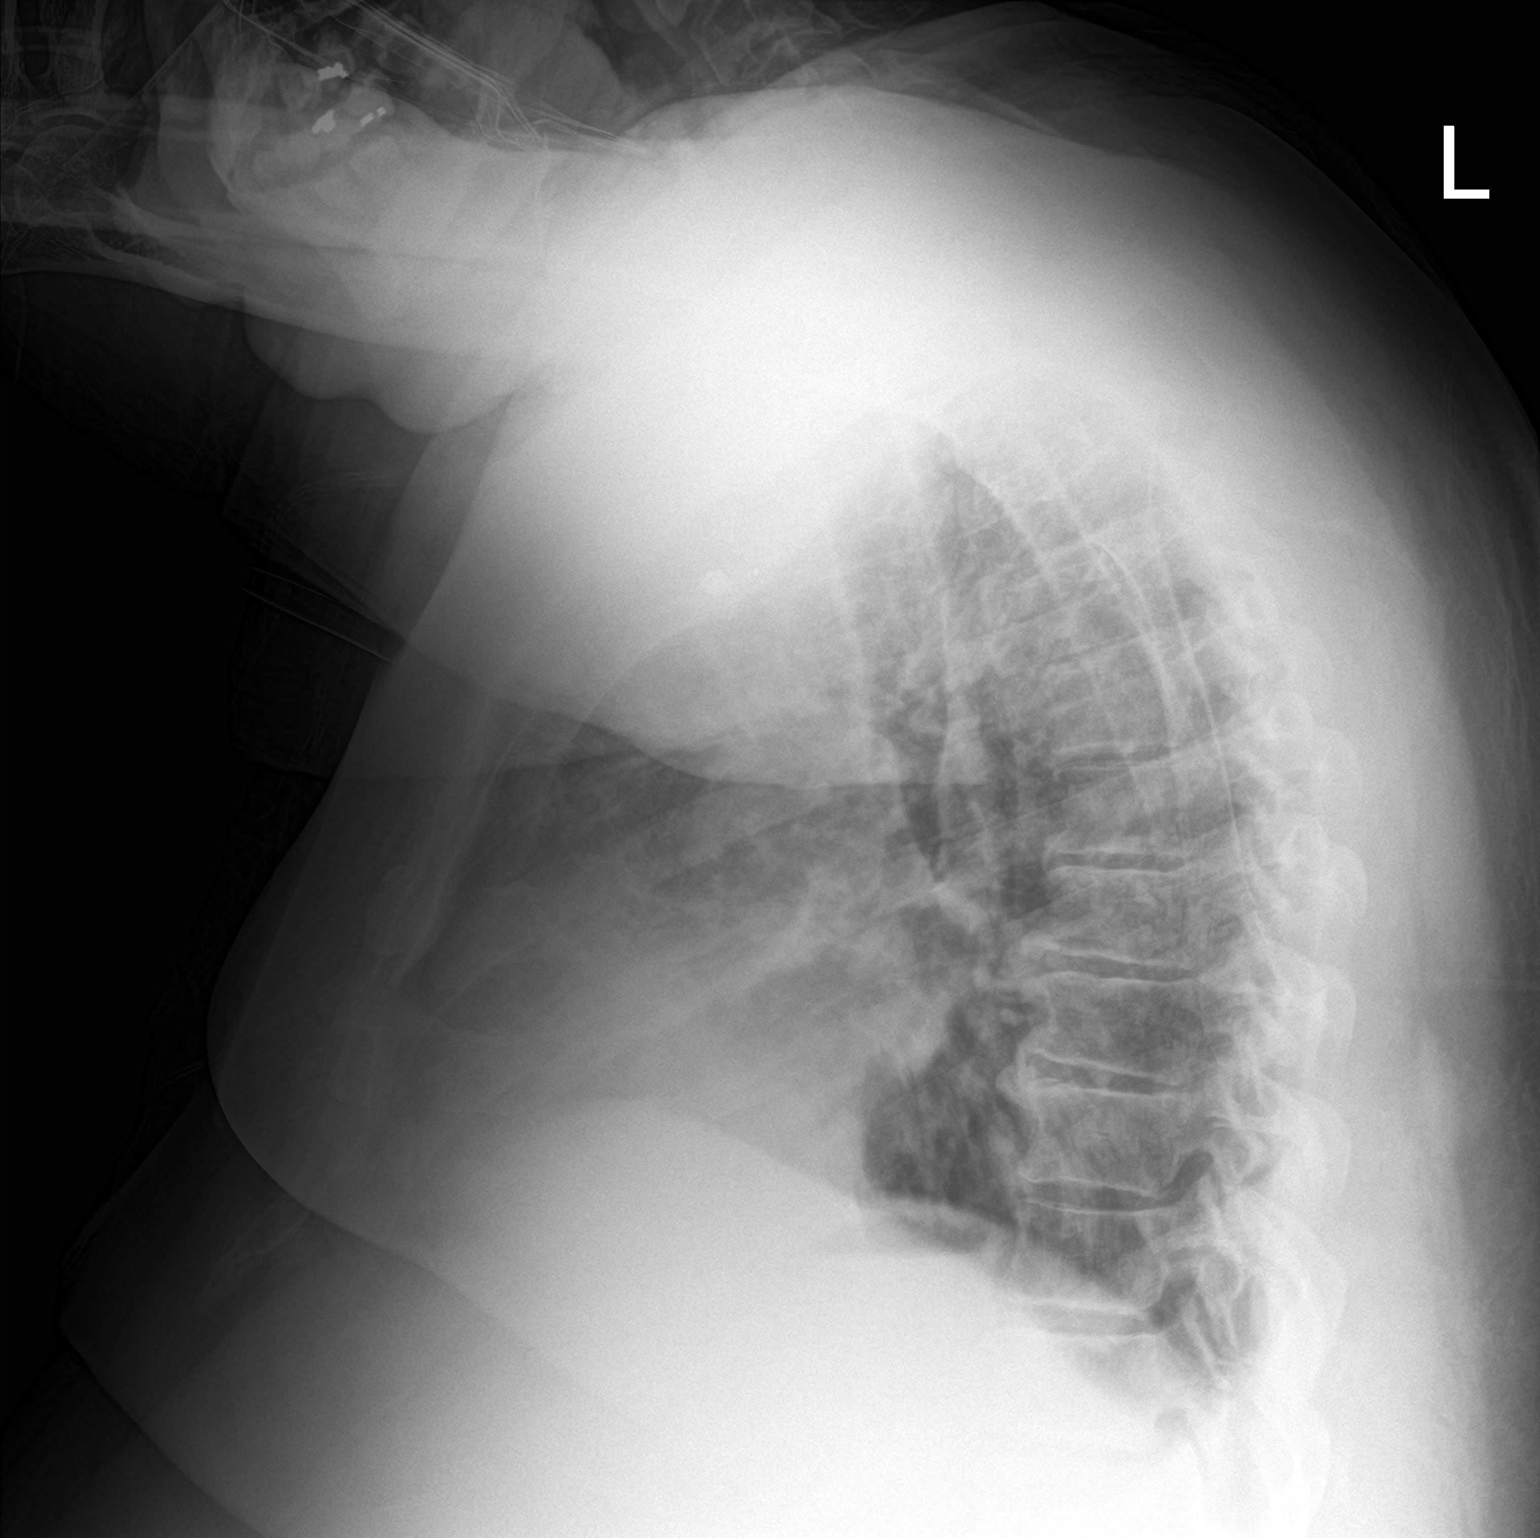

[2 of 2 positions shown; findings below may reference images not displayed]

FINDINGS: Stable mild cardiomegaly. Low lung volumes. Streaky interstitial
opacities in the bilateral lung bases. No pleural effusion or
pneumothorax.
IMPRESSION: Low lung volumes with streaky interstitial opacities in the
bilateral lung bases. Findings may be related to atelectasis,
infection, or aspiration.

## 2021-09-26 IMAGING — MR MR HEAD W/O CM
13 of 14 series · 44 of 48 positions shown · non-contrast
Comparison: [DATE] MRI

CLINICAL DATA: Transient ischemic attack, difficulty drinking water
during breakfast

EXAM:
MRI HEAD WITHOUT CONTRAST
TECHNIQUE: Multiplanar, multiecho pulse sequences of the brain and surrounding
structures were obtained without intravenous contrast.

[Series 9: DWI · axial · 3.0mm · 0.92mm/px · z∈[-142,+16]mm · 7 of 108 slices shown (1 of 4)]
[im 1/108]
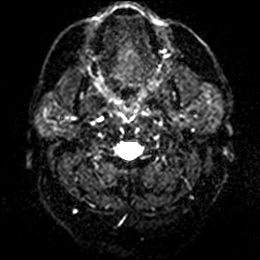
[im 18/108]
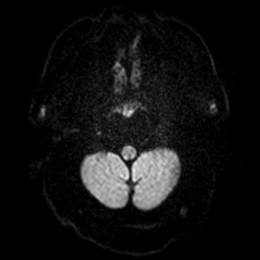
[im 36/108]
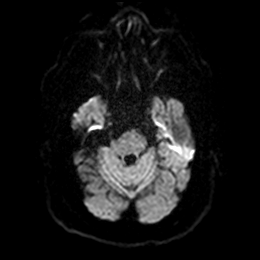
[im 54/108]
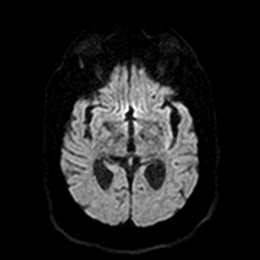
[im 72/108]
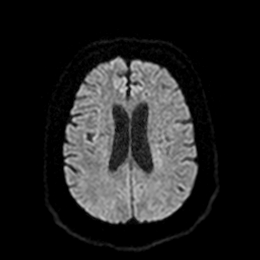
[im 90/108]
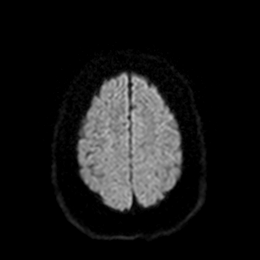
[im 108/108]
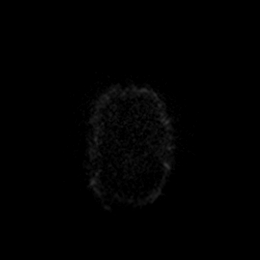

[Series 10: DWI · axial · 3.0mm · 0.92mm/px · z∈[-142,+16]mm · 4 of 54 slices shown (2 of 4)]
[im 1/54]
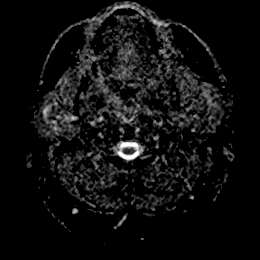
[im 18/54]
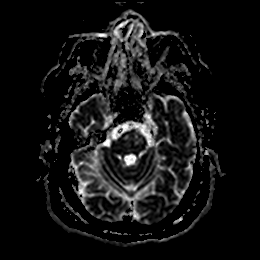
[im 36/54]
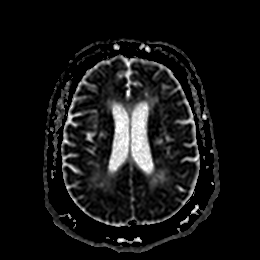
[im 54/54]
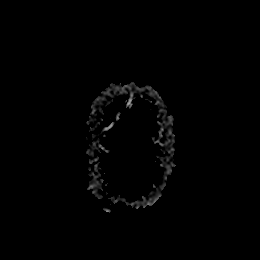

[Series 11: DWI · coronal · 4.0mm · 0.88mm/px · 5 of 71 slices shown (3 of 4)]
[im 1/71]
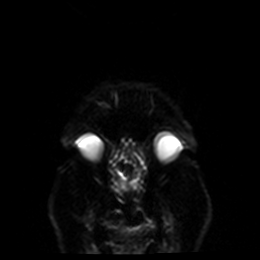
[im 18/71]
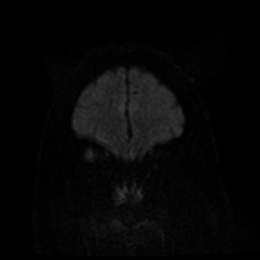
[im 36/71]
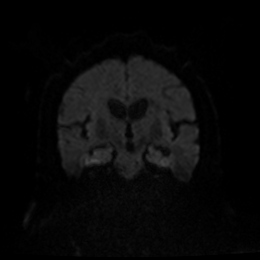
[im 53/71]
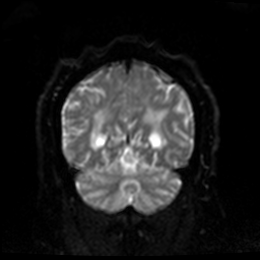
[im 71/71]
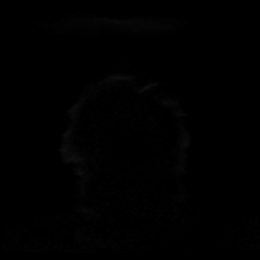

[Series 12: DWI · coronal · 4.0mm · 0.88mm/px · 2 of 36 slices shown (4 of 4)]
[im 1/36]
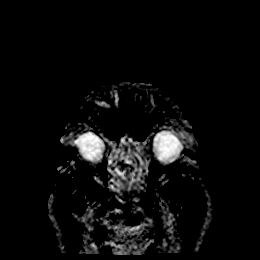
[im 36/36]
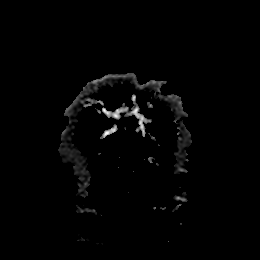

[Series 13: T1 · sagittal · 5.0mm · 0.75mm/px · 2 of 26 slices shown (1 of 2)]
[im 1/26]
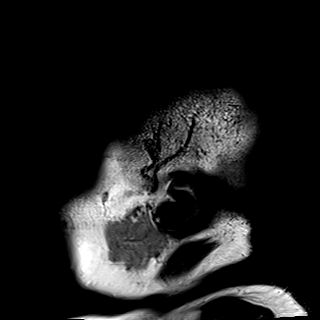
[im 26/26]
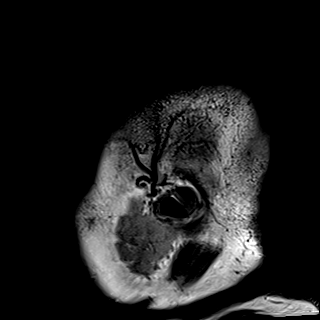

[Series 14: T2 · axial · 5.0mm · 0.75mm/px · z∈[-143,+18]mm · 2 of 28 slices shown (1 of 2)]
[im 1/28]
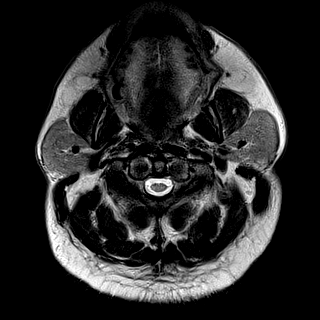
[im 28/28]
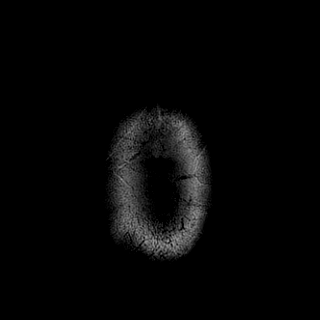

[Series 15: FLAIR · axial · 5.0mm · 0.45mm/px · z∈[-145,+17]mm · 2 of 28 slices shown]
[im 1/28]
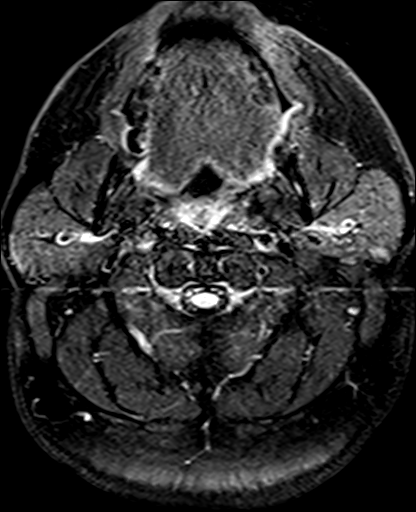
[im 28/28]
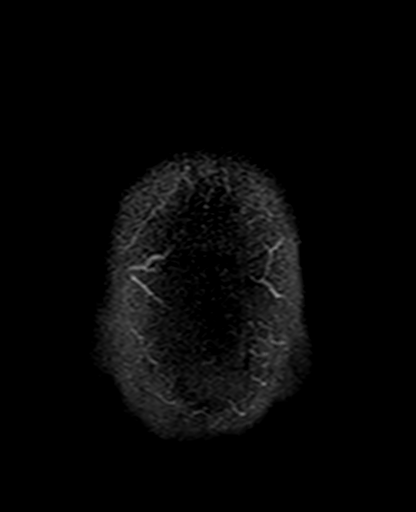

[Series 16: mag_images · axial · 3.0mm · 0.94mm/px · z∈[-154,+22]mm · 4 of 60 slices shown]
[im 1/60]
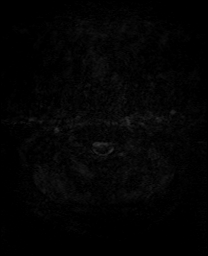
[im 20/60]
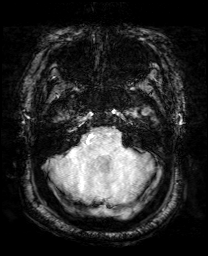
[im 40/60]
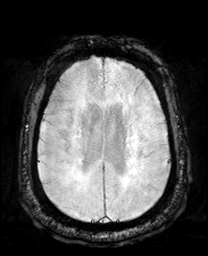
[im 60/60]
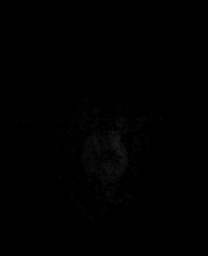

[Series 17: pha_images · axial · 3.0mm · 0.94mm/px · z∈[-151,+22]mm · 4 of 59 slices shown]
[im 1/59]
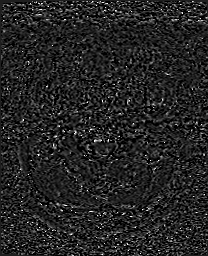
[im 20/59]
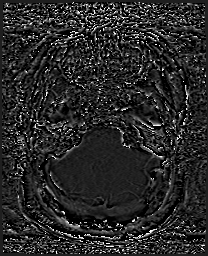
[im 39/59]
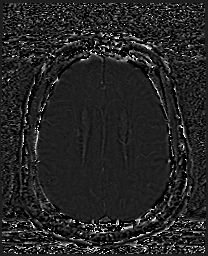
[im 59/59]
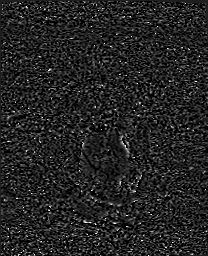

[Series 18: swi_images · axial · 3.0mm · 0.94mm/px · z∈[-154,+22]mm · 4 of 60 slices shown]
[im 1/60]
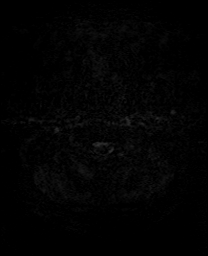
[im 20/60]
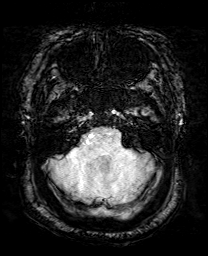
[im 40/60]
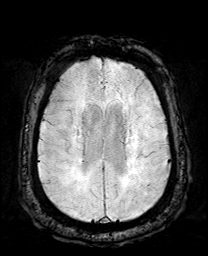
[im 60/60]
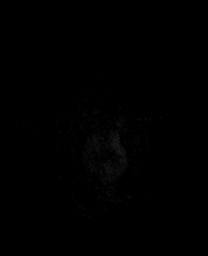

[Series 19: mip_images(sw) · axial · 24.0mm · 0.94mm/px · z∈[-144,+12]mm · 4 of 53 slices shown]
[im 1/53]
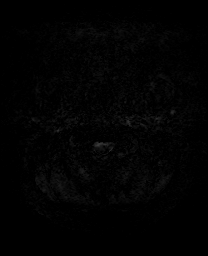
[im 18/53]
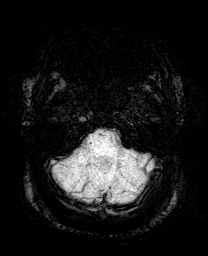
[im 35/53]
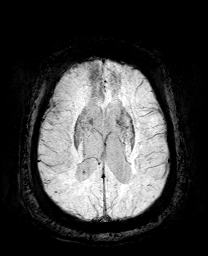
[im 53/53]
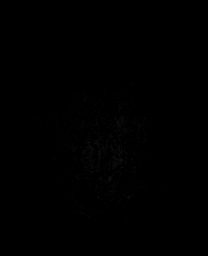

[Series 21: T2 · coronal · 5.0mm · 0.34mm/px · 2 of 31 slices shown (2 of 2)]
[im 1/31]
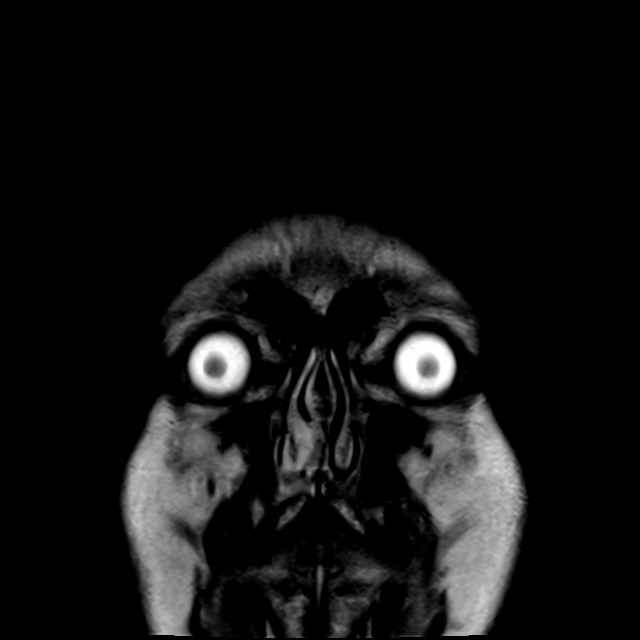
[im 31/31]
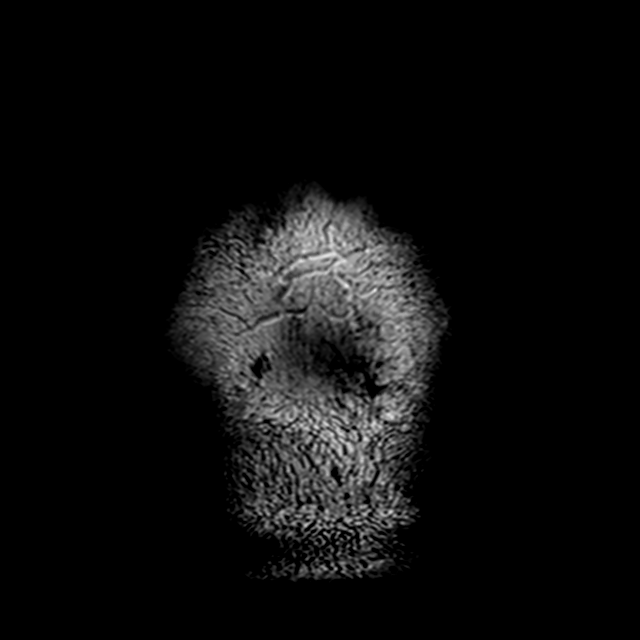

[Series 22: T1 · sagittal · 5.0mm · 0.75mm/px · 2 of 26 slices shown (2 of 2)]
[im 1/26]
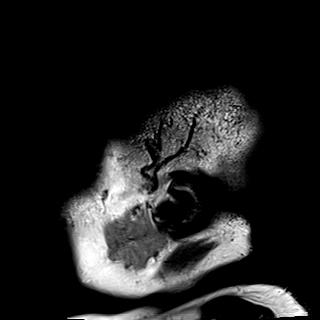
[im 26/26]
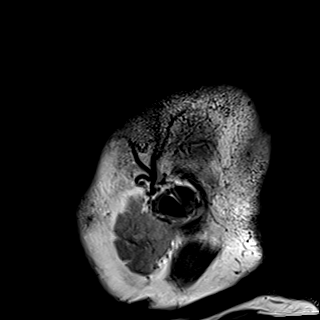

[44 of 48 positions shown; findings below may reference images not displayed]

FINDINGS: Brain: No restricted diffusion to suggest acute or subacute
infarction. No acute hemorrhage, mass, mass effect, or midline
shift. No hydrocephalus or extra-axial collection. Redemonstrated
chronic white matter infarcts in the bilateral frontal and parietal
lobes and left temporal lobe, with additional lacunar infarcts in
the thalami, basal ganglia, and pons. Several of these are new from
the prior exam and correlate with the acute infarcts seen on the
[DATE] exam. T2 hyperintense signal in the periventricular white
matter and pons, likely the sequela of severe chronic small vessel
ischemic disease.

Vascular: Normal flow voids.

Skull and upper cervical spine: Normal marrow signal.

Sinuses/Orbits: Negative.

Other: The mastoids are well aerated.
IMPRESSION: 1. No acute intracranial process.
2. Sequela of remote infarcts in the bilateral frontal and parietal
lobes, left temporal lobe, thalami, basal ganglia, and pons; several
of these correlate with the acute infarcts seen on [DATE].

## 2021-09-26 MED ORDER — LORAZEPAM 2 MG/ML IJ SOLN
1.0000 mg | Freq: Once | INTRAMUSCULAR | Status: AC | PRN
  Administered 2021-09-26: 1 mg via INTRAVENOUS
  Filled 2021-09-26: qty 1

## 2021-09-26 MED ORDER — DOXYCYCLINE HYCLATE 100 MG PO CAPS: 100.0000 mg | ORAL_CAPSULE | Freq: Two times a day (BID) | ORAL | 0 refills | Status: AC

## 2021-09-26 NOTE — ED Notes (Signed)
Pt was given ice chips and a cup of water. Pt tolerated water really well.

## 2021-09-26 NOTE — Discharge Instructions (Signed)
You were seen in the emergency room today after having some difficulty swallowing and vomiting.  Your labs and MRI are reassuring and do not show any sign of stroke.  Your x-ray shows some possible developing pneumonia but this could also be artifact on the x-ray.  I have provided a prescription for antibiotic which I would begin taking only if you develop more severe cough, coughing up phlegm, fevers, or shortness of breath.  If symptoms become severe he should return to the emergency department for reevaluation.  If you do not develop any of those symptoms you can hold off on taking the antibiotic.

## 2021-09-26 NOTE — ED Provider Notes (Signed)
Emergency Medicine Provider Triage Evaluation Note  [name]. , a 50 y.o. male  was evaluated in triage.  Pt complains of an isolated choking episode when he was drinking some water during breakfast.  He states he had similar symptoms in the past when he had previous stroke.  He does have a history of CVA secondary to atrial fibrillation and poor anticoagulation compliance.  He tolerated his food this morning well and his food last night well.  No other episodes of vomiting.  No focal weakness or numbness.  Patient is anticoagulated on Eliquis and has not missed any doses.  Also states his blood sugars have been running in the 300s.  Review of Systems  Positive:  Negative: See above   Physical Exam  BP (!) 146/87 (BP Location: Left Arm)   Pulse 73   Temp 98.5 F (36.9 C) (Oral)   Resp 18   Ht 5\' 9"  (1.753 m)   Wt (!) 141.1 kg   SpO2 91%   BMI 45.93 kg/m  Gen:   Awake, no distress   Resp:  Normal effort  MSK:   Moves extremities without difficulty  Other:  Cranial nerves II through XII intact.  5/5 strength to the upper and lower extremities.  Normal sensation to the upper and lower extremities.  Medical Decision Making  Medically screening exam initiated at 12:10 PM.  Appropriate orders placed.  Angela Platner. was informed that the remainder of the evaluation will be completed by another provider, this initial triage assessment does not replace that evaluation, and the importance of remaining in the ED until their evaluation is complete.     De Burrs [place], PA-C 09/26/21 1213    14/12/22, MD 09/26/21 [phone]

## 2021-09-26 NOTE — ED Triage Notes (Signed)
Pt states he was drinking water approximately one hour prior to arrival and had trouble swallowing and felt like he was choking. Per pt a similar episode happened earlier this year and pt stated he had a stroke. Pt A+Ox4, walking to triage. Pt denies weakness, numbness, vision changes, speech changes. No SOB per pt.

## 2021-09-26 NOTE — ED Provider Notes (Signed)
Emergency Department Provider Note   I have reviewed the triage vital signs and the nursing notes.   HISTORY  Chief Complaint Choking   HPI [name]. is a 50 y.o. male with PMH of prior CVA presents to the ED with episode of choking and vomiting while drinking water. Patient was drinking some water today and choked while drinking. He then began vomiting. No blood noted. No CP or SOB. He denies abdominal pain or nausea. He is concerned because he has had a prior CVA that presented in exactly this manner before. Denies any food bolus sensation in the throat. He is managing is secretions. No fever. No weakness/numbness. No residual CVA deficits from prior CVAs.    Past Medical History:  Diagnosis Date   Acute pancreatitis 01/22/2019   Diabetes mellitus    GERD (gastroesophageal reflux disease)    History of alcohol abuse    History of cocaine use    History of echocardiogram    a. Echo 4/14: Moderate LVH, vigorous LVEF, EF 65-70%, normal wall motion, grade 2 diastolic dysfunction, mildly dilated aortic root and ascending aorta, ascending aorta 40 mm, aortic root 38 mm, mild LAE   Hx of cardiovascular stress test    a. GXT 5/14: No ischemic changes  //  b. ETT-Myoview 3/16:  Low risk, no ischemia, EF 58%   Hyperlipidemia    Hypertension    Morbid obesity (HCC)    Paroxysmal atrial fibrillation (HCC)    Occurring in 2008, with several recurrence since then (including in the setting of + cocaine on UDS).   Sleep apnea    uses CPAP   Stroke (HCC)    x3   SVT (supraventricular tachycardia) (HCC)    mid to [name] RP SVT 09/2019    Patient Active Problem List   Diagnosis Date Noted   History of CVA (cerebrovascular accident) 04/14/2021   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) 03/15/2021   Hypertensive retinopathy of both eyes, grade 2 03/15/2021   Healthcare maintenance 02/23/2021   Right sided weakness 11/19/2020   Acute renal failure  superimposed on stage 3 chronic kidney disease (HCC)    Mediastinal adenopathy 01/06/2020   Morbid obesity due to excess calories (HCC) 12/08/2016   Paroxysmal atrial fibrillation (HCC) 01/23/2013   Type 2 diabetes mellitus with complications (HCC) 01/23/2013   Hypertension 01/23/2013   Tobacco user 01/23/2013   Chronic diastolic heart failure (HCC) 01/21/2013   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   Seasonal and perennial allergic rhinitis 02/13/2008   Dyslipidemia 02/12/2008   Cocaine abuse (HCC) 02/12/2008   Obstructive sleep apnea 02/12/2008    Past Surgical History:  Procedure Laterality Date   APPENDECTOMY     ATRIAL FIBRILLATION ABLATION N/A 11/27/2019   Procedure: ATRIAL FIBRILLATION ABLATION;  Surgeon: [name], MD;  Location: [facility] INVASIVE CV LAB;  Service: Cardiovascular;  Laterality: N/A;   BUBBLE STUDY  05/20/2021   Procedure: BUBBLE STUDY;  Surgeon: Little Ishikawa, MD;  Location: [facility] ENDOSCOPY;  Service: Cardiovascular;;   ROTATOR CUFF REPAIR     SVT ABLATION N/A 11/27/2019   Procedure: SVT ABLATION;  Surgeon: [name], MD;  Location: [facility] INVASIVE CV LAB;  Service: Cardiovascular;  Laterality: N/A;   TEE WITHOUT CARDIOVERSION N/A 05/20/2021   Procedure: TRANSESOPHAGEAL ECHOCARDIOGRAM (TEE);  Surgeon: Little Ishikawa, MD;  Location: [facility] ENDOSCOPY;  Service: Cardiovascular;  Laterality: N/A;   TONSILLECTOMY      Allergies Shrimp [shellfish allergy], Banana, Watermelon flavor, Barron Schmid  oil, Other, Peanut-containing drug products, and Sulfa antibiotics  Family History  Problem Relation Age of Onset   Diabetes Mother    Heart attack Father 45   Stroke Maternal Grandmother    Stroke Paternal Grandmother    Diabetes Paternal Grandfather     Social History Social History   Tobacco Use   Smoking status: Some Days    Packs/day: 0.50    Years: 28.00    Pack years: 14.00    Types: Cigarettes   Smokeless tobacco: Former    Types: Snuff   Tobacco  comments:    10 cigs per day  Vaping Use   Vaping Use: Never used  Substance Use Topics   Alcohol use: Yes    Alcohol/week: 6.0 standard drinks    Types: 6 Standard drinks or equivalent per week    Comment: rarely   Drug use: Not Currently    Comment: cocaine in the past, none currently    Review of Systems  Constitutional: No fever/chills Eyes: No visual changes. ENT: No sore throat. Cardiovascular: Denies chest pain. Respiratory: Denies shortness of breath. Gastrointestinal: No abdominal pain.  No nausea. Positive vomiting.  No diarrhea.  No constipation. Positive choking and vomiting episode.  Genitourinary: Negative for dysuria. Musculoskeletal: Negative for back pain. Skin: Negative for rash. Neurological: Negative for headaches, focal weakness or numbness.  10-point ROS otherwise negative.  ____________________________________________   PHYSICAL EXAM:  VITAL SIGNS: ED Triage Vitals  Enc Vitals Group     BP 09/26/21 1157 (!) 146/87     Pulse Rate 09/26/21 1157 73     Resp 09/26/21 1157 18     Temp 09/26/21 1157 98.5 F (36.9 C)     Temp Source 09/26/21 1157 Oral     SpO2 09/26/21 1157 91 %     Weight 09/26/21 1158 (!) 311 lb (141.1 kg)     Height 09/26/21 1158 5\' 9"  (1.753 m)   Constitutional: Alert and oriented. Well appearing and in no acute distress. Eyes: Conjunctivae are normal. Head: Atraumatic. Nose: No congestion/rhinnorhea. Mouth/Throat: Mucous membranes are moist.  Neck: No stridor.  Cardiovascular: Normal rate, regular rhythm. Good peripheral circulation. Grossly normal heart sounds.   Respiratory: Normal respiratory effort.  No retractions. Lungs CTAB. Gastrointestinal: Soft and nontender. No distention.  Musculoskeletal: No lower extremity tenderness nor edema. No gross deformities of extremities. Neurologic:  Normal speech and language. No gross focal neurologic deficits are appreciated. No speech deficits. 5/5 strength bilaterally with  normal sensation and coordination.  Skin:  Skin is warm, dry and intact. No rash noted.  ____________________________________________   LABS (all labs ordered are listed, but only abnormal results are displayed)  Labs Reviewed  CBC WITH DIFFERENTIAL/PLATELET - Abnormal; Notable for the following components:      Result Value   Hemoglobin 12.8 (*)    Eosinophils Absolute 0.7 (*)    All other components within normal limits  COMPREHENSIVE METABOLIC PANEL - Abnormal; Notable for the following components:   Glucose, Bld 212 (*)    BUN 27 (*)    Creatinine, Ser 1.87 (*)    Calcium 8.7 (*)    GFR, Estimated 43 (*)    All other components within normal limits  CBG MONITORING, ED - Abnormal; Notable for the following components:   Glucose-Capillary 148 (*)    All other components within normal limits   ____________________________________________  EKG  None  ____________________________________________  RADIOLOGY  DG Chest 2 View  Result Date: 09/26/2021 CLINICAL DATA:  Choking EXAM: CHEST - 2 VIEW COMPARISON:  07/04/2021 FINDINGS: Stable mild cardiomegaly. Low lung volumes. Streaky interstitial opacities in the bilateral lung bases. No pleural effusion or pneumothorax. IMPRESSION: Low lung volumes with streaky interstitial opacities in the bilateral lung bases. Findings may be related to atelectasis, infection, or aspiration. Electronically Signed   By: Davina Poke D.O.   On: 09/26/2021 13:05    ____________________________________________   PROCEDURES  Procedure(s) performed:   Procedures  None ____________________________________________   INITIAL IMPRESSION / ASSESSMENT AND PLAN / ED COURSE  Pertinent labs & imaging results that were available during my care of the patient were reviewed by me and considered in my medical decision making (see chart for details).   Patient presents to the ED with choking while swallowing water and vomiting. Exam/history not  consistent with food bolus. No focal neuro deficits on exam. Patient has a possible PNA developing on CXR but no symptoms. Suspect atelectasis clinically but will provide a watch and wait Rx for abx. Aspiration is a consideration but patient is currently with normal vitals and no active PNA symptoms.   CVA considered given prior history of CVA presenting in a similar fashion. MRI brain here ordered in that context and without acute findings. Plan for discharge with abx Rx and PCP follow up plan.    ____________________________________________  FINAL CLINICAL IMPRESSION(S) / ED DIAGNOSES  Final diagnoses:  Choking, initial encounter     MEDICATIONS GIVEN DURING THIS VISIT:  Medications  LORazepam (ATIVAN) injection 1 mg (1 mg Intravenous Given 09/26/21 2024)     NEW OUTPATIENT MEDICATIONS STARTED DURING THIS VISIT:  Discharge Medication List as of 09/26/2021  9:55 PM     START taking these medications   Details  doxycycline (VIBRAMYCIN) 100 MG capsule Take 1 capsule (100 mg total) by mouth 2 (two) times daily for 7 days., Starting Mon 09/26/2021, Until Mon 10/03/2021, Print        Note:  This document was prepared using Dragon voice recognition software and may include unintentional dictation errors.  Nanda Quinton, MD, [facility] Emergency Medicine    Hendy Brindle, W[facility], MD 09/29/21 [phone]

## 2021-09-26 NOTE — Progress Notes (Signed)
Internal Medicine Clinic Attending  I saw and evaluated the patient.  I personally confirmed the key portions of the history and exam documented by Dr. Welton Flakes and I reviewed pertinent patient test results.  The assessment, diagnosis, and plan were formulated together and I agree with the documentation in the resident's note. BP in clinic not reflective of treated pressure as patient did not take his medications before today's visit. We have asked him to complete home monitoring and take his medications before f/u visit. Discussed dietary changes today given labile blood sugars reported due to soda intake.

## 2021-09-26 NOTE — ED Notes (Signed)
Pt taken to MRI  

## 2021-09-28 ENCOUNTER — Encounter: Payer: 59 | Admitting: Internal Medicine

## 2021-09-28 ENCOUNTER — Encounter: Payer: 59 | Admitting: Dietician

## 2021-09-28 NOTE — Progress Notes (Deleted)
° °  CC: ***  HPI:  [name]. is a 50 y.o. with medical history as below presenting to [facility] for ***  Please see problem-based list for further details, assessments, and plans.  Past Medical History:  Diagnosis Date   Acute pancreatitis 01/22/2019   Diabetes mellitus    GERD (gastroesophageal reflux disease)    History of alcohol abuse    History of cocaine use    History of echocardiogram    a. Echo 4/14: Moderate LVH, vigorous LVEF, EF 65-70%, normal wall motion, grade 2 diastolic dysfunction, mildly dilated aortic root and ascending aorta, ascending aorta 40 mm, aortic root 38 mm, mild LAE   Hx of cardiovascular stress test    a. GXT 5/14: No ischemic changes  //  b. ETT-Myoview 3/16:  Low risk, no ischemia, EF 58%   Hyperlipidemia    Hypertension    Morbid obesity (HCC)    Paroxysmal atrial fibrillation (HCC)    Occurring in 2008, with several recurrence since then (including in the setting of + cocaine on UDS).   Sleep apnea    uses CPAP   Stroke (HCC)    x3   SVT (supraventricular tachycardia) (HCC)    mid to long RP SVT 09/2019   Review of Systems:  Review of system negative unless stated in the problem list or HPI.    Physical Exam:  There were no vitals filed for this visit.  Physical Exam  Assessment & Plan:   See Encounters Tab for problem based charting.  Patient {GC/GE:3044014::"discussed with","seen with"} Dr. {NAMES:[id number]::"Butcher","Guilloud","Hoffman","Mullen","Narendra","Raines","Vincent"} Gwenevere Abbot, MD   Choking ED visit on 12/12, MRI did not show any acute abnormality. Pt discharged as it was resolved.     DM HR: Jardiance 25 mg, Metformin 1000 mg BID, Tirzipatide 2.5 mg qweekly ROS: A/P:   HTN HR: Norvasc 10 mg, Olmesartan 20 mg, Toprol 100 mg ROS: PE: A/P:

## 2021-09-30 ENCOUNTER — Telehealth: Payer: Self-pay | Admitting: Internal Medicine

## 2021-09-30 MED ORDER — AMPHETAMINE-DEXTROAMPHET ER 30 MG PO CP24: 30.0000 mg | ORAL_CAPSULE | Freq: Every day | ORAL | 0 refills | Status: DC | PRN

## 2021-09-30 NOTE — Telephone Encounter (Signed)
Patient is aware of below message and voiced his understanding.  Nothing further needed at this time.   

## 2021-09-30 NOTE — Telephone Encounter (Signed)
Adderall refilled

## 2021-09-30 NOTE — Telephone Encounter (Signed)
Spoke to patient, who is requesting refill on adderall 30mg . Last refilled 08/31/2021 #30 with 0 refills.  Last OV 05/23/2021 with pending OV 12/09/2021. Preferred pharmacy is [facility] [address]   Dr. [employer], please advise. Thanks

## 2021-10-02 ENCOUNTER — Other Ambulatory Visit: Payer: Self-pay | Admitting: Student

## 2021-10-02 DIAGNOSIS — I10 Essential (primary) hypertension: Secondary | ICD-10-CM

## 2021-10-12 ENCOUNTER — Other Ambulatory Visit: Payer: Self-pay | Admitting: Internal Medicine

## 2021-10-18 ENCOUNTER — Encounter: Payer: 59 | Admitting: Internal Medicine

## 2021-10-18 ENCOUNTER — Encounter: Payer: Self-pay | Admitting: Internal Medicine

## 2021-10-18 NOTE — Assessment & Plan Note (Signed)
Benefits when used.  We will try to improve comfort 5 change in auto range 10-20.  Emphasis on compliance and comfort as discussed.

## 2021-10-18 NOTE — Assessment & Plan Note (Signed)
There is a significant OSA component.  His CVA and a general lifestyle lack of self limitation, reflected in his obesity, creating difficulty sorting out OSA from narcolepsy and poor sleep habits. Plan-continued effort at education and guidance.  Refill Adderall when needed.

## 2021-10-31 ENCOUNTER — Telehealth: Payer: Self-pay | Admitting: Internal Medicine

## 2021-10-31 MED ORDER — AMPHETAMINE-DEXTROAMPHET ER 30 MG PO CP24: 30.0000 mg | ORAL_CAPSULE | Freq: Every day | ORAL | 0 refills | Status: DC | PRN

## 2021-10-31 NOTE — Telephone Encounter (Signed)
Pt called needing to have his Adderall refilled. Pharmacy is [facility] of [address]. Dr. Annamaria Boots, please advise.  Allergies  Allergen Reactions   Shrimp [Shellfish Allergy] Anaphylaxis   Banana Other (See Comments)    Pt gags   Watermelon Flavor Nausea And Vomiting   Almond Oil Itching    Roof of mouth itches   Other Itching    Grapes cause itching   Peanut-Containing Drug Products Itching and Cough   Sulfa Antibiotics Itching    Current Outpatient Medications:    Accu-Chek Softclix Lancets lancets, TEST UP TO 4 TIMES A DAY, Disp: 100 each, Rfl: 2   amLODipine (NORVASC) 10 MG tablet, Take 1 tablet (10 mg total) by mouth daily., Disp: 30 tablet, Rfl: 11   amphetamine-dextroamphetamine (ADDERALL XR) 30 MG 24 hr capsule, Take 1 capsule (30 mg total) by mouth daily as needed (focus). 1 daily as needed, Disp: 30 capsule, Rfl: 0   apixaban (ELIQUIS) 5 MG TABS tablet, Take 1 tablet (5 mg total) by mouth 2 (two) times daily., Disp: 90 tablet, Rfl: 3   aspirin 81 MG EC tablet, Take 1 tablet (81 mg total) by mouth daily. Swallow whole., Disp: 90 tablet, Rfl: 1   atorvastatin (LIPITOR) 80 MG tablet, Take 1 tablet (80 mg total) by mouth daily., Disp: 90 tablet, Rfl: 3   blood glucose meter kit and supplies KIT, Dispense based on patient and insurance preference. Use up to four times daily as directed., Disp: 1 each, Rfl: 0   Blood Pressure Monitoring (BLOOD PRESSURE MONITOR/L CUFF) MISC, 1 Units by Does not apply route daily., Disp: 1 each, Rfl: 0   empagliflozin (JARDIANCE) 25 MG TABS tablet, Take 1 tablet (25 mg total) by mouth daily., Disp: 30 tablet, Rfl: 2   flecainide (TAMBOCOR) 100 MG tablet, TAKE 1 TABLET BY MOUTH TWICE A DAY, Disp: 60 tablet, Rfl: 7   metFORMIN (GLUCOPHAGE) 1000 MG tablet, Take 1 tablet (1,000 mg total) by mouth 2 (two) times daily with a meal., Disp: 120 tablet, Rfl: 3   metoprolol succinate (TOPROL XL) 100 MG 24 hr tablet, Take 1 tablet (100 mg total) by mouth daily.  Take with or immediately following a meal., Disp: 90 tablet, Rfl: 1   modafinil (PROVIGIL) 200 MG tablet, Take 1 tablet (200 mg total) by mouth daily. (Patient not taking: Reported on 09/12/2021), Disp: 30 tablet, Rfl: 0   nicotine (NICODERM CQ - DOSED IN MG/24 HOURS) 21 mg/24hr patch, PLACE 1 PATCH (21 MG TOTAL) ONTO THE SKIN DAILY. (Patient not taking: Reported on 09/12/2021), Disp: 28 patch, Rfl: 1   nicotine (NICODERM CQ) 21 mg/24hr patch, Place 1 patch (21 mg total) onto the skin daily. (Patient not taking: Reported on 09/12/2021), Disp: 28 patch, Rfl: 0   olmesartan (BENICAR) 20 MG tablet, TAKE 1 TABLET BY MOUTH EVERY DAY, Disp: 90 tablet, Rfl: 0   pantoprazole (PROTONIX) 40 MG tablet, Take 1 tablet (40 mg total) by mouth daily., Disp: 90 tablet, Rfl: 3   polyethylene glycol (MIRALAX) 17 g packet, Take 17 g by mouth daily., Disp: 14 each, Rfl: 0   tirzepatide (MOUNJARO) 2.5 MG/0.5ML Pen, Inject 2.5 mg into the skin once a week., Disp: 3 mL, Rfl: 1

## 2021-10-31 NOTE — Telephone Encounter (Signed)
Called and spoke with pt letting him know that [facility] refilled his Adderall for him and he verbalized understanding. Nothing further needed. °

## 2021-10-31 NOTE — Telephone Encounter (Signed)
Adderall refilled

## 2021-11-14 ENCOUNTER — Other Ambulatory Visit: Payer: Self-pay | Admitting: Student

## 2021-11-14 DIAGNOSIS — E1165 Type 2 diabetes mellitus with hyperglycemia: Secondary | ICD-10-CM

## 2021-11-23 ENCOUNTER — Ambulatory Visit: Payer: 59 | Admitting: Internal Medicine

## 2021-12-01 ENCOUNTER — Telehealth: Payer: Self-pay | Admitting: Internal Medicine

## 2021-12-01 NOTE — Telephone Encounter (Signed)
Patient requested a refill for Adderall XR $RemoveBef'30mg'CbxZFUieCz$  to be sent to [place].   LOV 05/23/21 with Dr. Annamaria Boots  Last refill-  Adderall $RemoveB'30mg'zKdeIBqd$  #30, with no refills was last refilled 10/31/21  Message routed to Dr. Annamaria Boots to advise  Allergies  Allergen Reactions   Shrimp [Shellfish Allergy] Anaphylaxis   Banana Other (See Comments)    Pt gags   Watermelon Flavor Nausea And Vomiting   Almond Oil Itching    Roof of mouth itches   Other Itching    Grapes cause itching   Peanut-Containing Drug Products Itching and Cough   Sulfa Antibiotics Itching   Current Outpatient Medications on File Prior to Visit  Medication Sig Dispense Refill   JARDIANCE 25 MG TABS tablet TAKE 1 TABLET (25 MG TOTAL) BY MOUTH DAILY. 30 tablet 2   Accu-Chek Softclix Lancets lancets TEST UP TO 4 TIMES A DAY 100 each 2   amLODipine (NORVASC) 10 MG tablet Take 1 tablet (10 mg total) by mouth daily. 30 tablet 11   amphetamine-dextroamphetamine (ADDERALL XR) 30 MG 24 hr capsule Take 1 capsule (30 mg total) by mouth daily as needed (focus). 1 daily as needed 30 capsule 0   apixaban (ELIQUIS) 5 MG TABS tablet Take 1 tablet (5 mg total) by mouth 2 (two) times daily. 90 tablet 3   aspirin 81 MG EC tablet Take 1 tablet (81 mg total) by mouth daily. Swallow whole. 90 tablet 1   atorvastatin (LIPITOR) 80 MG tablet Take 1 tablet (80 mg total) by mouth daily. 90 tablet 3   blood glucose meter kit and supplies KIT Dispense based on patient and insurance preference. Use up to four times daily as directed. 1 each 0   Blood Pressure Monitoring (BLOOD PRESSURE MONITOR/L CUFF) MISC 1 Units by Does not apply route daily. 1 each 0   flecainide (TAMBOCOR) 100 MG tablet TAKE 1 TABLET BY MOUTH TWICE A DAY 60 tablet 7   metFORMIN (GLUCOPHAGE) 1000 MG tablet Take 1 tablet (1,000 mg total) by mouth 2 (two) times daily with a meal. 120 tablet 3   metoprolol succinate (TOPROL XL) 100 MG 24 hr tablet Take 1 tablet (100 mg total) by mouth daily.  Take with or immediately following a meal. 90 tablet 1   modafinil (PROVIGIL) 200 MG tablet Take 1 tablet (200 mg total) by mouth daily. (Patient not taking: Reported on 09/12/2021) 30 tablet 0   nicotine (NICODERM CQ - DOSED IN MG/24 HOURS) 21 mg/24hr patch PLACE 1 PATCH (21 MG TOTAL) ONTO THE SKIN DAILY. (Patient not taking: Reported on 09/12/2021) 28 patch 1   nicotine (NICODERM CQ) 21 mg/24hr patch Place 1 patch (21 mg total) onto the skin daily. (Patient not taking: Reported on 09/12/2021) 28 patch 0   olmesartan (BENICAR) 20 MG tablet TAKE 1 TABLET BY MOUTH EVERY DAY 90 tablet 0   pantoprazole (PROTONIX) 40 MG tablet Take 1 tablet (40 mg total) by mouth daily. 90 tablet 3   polyethylene glycol (MIRALAX) 17 g packet Take 17 g by mouth daily. 14 each 0   tirzepatide (MOUNJARO) 2.5 MG/0.5ML Pen Inject 2.5 mg into the skin once a week. 3 mL 1   No current facility-administered medications on file prior to visit.

## 2021-12-02 MED ORDER — AMPHETAMINE-DEXTROAMPHET ER 30 MG PO CP24: 30.0000 mg | ORAL_CAPSULE | Freq: Every day | ORAL | 0 refills | Status: DC | PRN

## 2021-12-02 NOTE — Telephone Encounter (Signed)
Adderall refilled

## 2021-12-02 NOTE — Telephone Encounter (Signed)
Called and spoke with pt letting him know that [facility] refilled his Adderall for him and he verbalized understanding. Nothing further needed.

## 2021-12-07 NOTE — Progress Notes (Signed)
HPI  male smoker followed for management of OSA and narcolepsy with cataplexy, complicated by allergic rhinitis, atrial fibrillation, dCHF, DM, HTN NPSG 11/23/00- AHI 20 per hour. Hypnagogic hallucination associated with dreaming. 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 Vivid dreams as soon as he falls asleep Multiple Sleep Latency Test 10/30/2012-pathologic daytime hypersomnia, nonspecific, compatible with idiopathic hypersomnia or narcolepsy. Mean latency 0.9 minutes with one sleep onset REM event CT chest 12/05/19- mediastinal and bilateral adenopathy, interstitial prominence, ASCVD NPSG 05/03/21- AHI 29.6/ hr, desaturation to 81%, CPAP to 16 (O2 sat on CPAP 16 was 94%), body weight  306 lbs   Suggest CPAP auto 10-20  =============================================================   05/23/21- 51 year old male Smoker (14 pkyrs)  followed for management of OSA and narcolepsy with cataplexy,, Mediastinal / Hilar Adenopathy, ILD,  complicated by allergic rhinitis, atrial fibrillation/Eliquis flecainide/ Xarelto, dCHF, Diastolic Dysfunction, CAD, CVA, DM2, HTN, Pancreatitis, Obesity, Tobacco use,  Covid vax- 3 Phizer - Adderall XR 30 mg, 1 daily CPAP auto 4-20/ [facility]            AirSense 10 AutoSet NPSG 05/03/21- AHI 29.6/ hr, desaturation to 81%, CPAP to 16 (O2 sat on CPAP 16 was 94%), body weight  306 lbs   Suggest CPAP auto 10-20 Download-compliance 20%, AHI 2.6/hr     Many short nights Body weight today-308 lbs We reviewed updated sleep study and discussed download with emphasis on compliance and comfort.  We will change auto range to 10-20. He feels daytime sleepiness is controlled with naps and his Adderall taken as directed. Unfortunately he is smoking and we discussed smoking cessation. CXR 04/14/21- IMPRESSION: Cardiomegaly with mild pulmonary venous congestion and bilateral interstitial prominence suggesting mild CHF.  12/08/21-  51 year old male Smoker (14 pkyrs)  followed for  management of OSA and narcolepsy with cataplexy,, Mediastinal / Hilar Adenopathy, ILD,  complicated by allergic rhinitis, atrial fibrillation/Eliquis flecainide/ Xarelto, dCHF, Diastolic Dysfunction, CAD, CVA, DM2, HTN, Pancreatitis, Obesity, Tobacco use,  Covid vax- 3 Phizer - Adderall XR 30 mg, 1 daily CPAP auto 4-20/ [facility]            AirSense 10 AutoSet Download-compliance 7%, AHI 0.3/ hr Body weight today-309 lbs Covid vax-3 Phizer Flu vax-had ED visit 12/12- choked drinking water -----Patient feels like he is doing good, no concerns.  He says he has not been using CPAP regularly because his nose gets stopped up.  Has full facemask.  Previously used Flonase and I recommended he start back.  He does expect increased nasal congestion with spring pollen season.  We reviewed CPAP goals and purpose again. He has not had new neurologic events.  Tends to strangle easily drinking water and this happens most often while watching TV.  He has worked with Human resources officer and has been told to "pay attention". Still smoking against advice with no effort to stop.  Reminded of importance. CXR 09/26/21- IMPRESSION: Low lung volumes with streaky interstitial opacities in the bilateral lung bases. Findings may be related to atelectasis, infection, or aspiration.  ROS-see HPI  + = positive Constitutional:   No-   weight loss, night sweats, fevers, +chills,  +fatigue, lassitude. HEENT:   No-  headaches, difficulty swallowing, tooth/dental problems, sore throat,       No-  sneezing, itching, ear ache, + nasal congestion, post nasal drip,  CV:  No-   chest pain, orthopnea, PND, swelling in lower extremities, anasarca, dizziness, palpitations Resp: + shortness of breath with exertion or at rest.  No-   productive cough,  No non-productive cough,  No- coughing up of blood.              No-   change in color of mucus.  No- wheezing.   Skin: No-   rash or lesions. GI:  No-   heartburn, indigestion,  abdominal pain, nausea, vomiting,  GU:  MS:  No-   joint pain or swelling.  Neuro-     +HPI Psych:  No- change in mood or affect. No depression or anxiety.  No memory loss.  OBJ- Physical Exam   General- Alert/ calm, Oriented, Affect-appropriate, Distress- none acute, +morbidly obese,   Skin- rash-none, lesions- none, excoriation- none Lymphadenopathy- none Head- atraumatic            Eyes- Gross vision intact, PERRLA, conjunctivae and secretions clear            Ears- Hearing, canals-normal            Nose- rhinitis/ turbinate edema, no-Septal dev, mucus, polyps, erosion, perforation             Throat- Mallampati III , mucosa clear , drainage- none, tonsils- atrophic Neck- flexible , trachea midline, no stridor , thyroid nl, carotid no bruit Chest - symmetrical excursion , unlabored           Heart/CV- RRR today, no murmur , no gallop  , no rub, nl s1 s2                           - JVD- none , edema- none, stasis changes- none, varices- none           Lung- clear to P&A, wheeze- none, cough- none , dullness-none, rub- none           Chest wall-  Abd-  Br/ Gen/ Rectal- Not done, not indicated Extrem- no edema Neuro- alert, nonfocal

## 2021-12-09 ENCOUNTER — Encounter: Payer: Self-pay | Admitting: Internal Medicine

## 2021-12-09 ENCOUNTER — Other Ambulatory Visit: Payer: Self-pay

## 2021-12-09 ENCOUNTER — Ambulatory Visit (INDEPENDENT_AMBULATORY_CARE_PROVIDER_SITE_OTHER): Payer: 59 | Admitting: Internal Medicine

## 2021-12-09 DIAGNOSIS — G4733 Obstructive sleep apnea (adult) (pediatric): Secondary | ICD-10-CM | POA: Diagnosis not present

## 2021-12-09 DIAGNOSIS — J3089 Other allergic rhinitis: Secondary | ICD-10-CM

## 2021-12-09 DIAGNOSIS — Z720 Tobacco use: Secondary | ICD-10-CM | POA: Diagnosis not present

## 2021-12-09 DIAGNOSIS — G47411 Narcolepsy with cataplexy: Secondary | ICD-10-CM

## 2021-12-09 DIAGNOSIS — J302 Other seasonal allergic rhinitis: Secondary | ICD-10-CM

## 2021-12-09 NOTE — Assessment & Plan Note (Signed)
He still averages a half a pack a day.  Reinforced importance of total cessation.

## 2021-12-09 NOTE — Assessment & Plan Note (Signed)
Adderall most mornings as prescribed.  He is casual about napping.  I suggested he try an over-the-counter sleep aid to help consolidate nighttime sleep in hopes he would be less sleepy in the daytime.

## 2021-12-09 NOTE — Assessment & Plan Note (Signed)
Season changes bringing increased rhinitis Plan-resume regular use of Flonase as discussed

## 2021-12-09 NOTE — Assessment & Plan Note (Signed)
He would likely need to be willing to participate in an external weight loss program to accomplish meaningful lifestyle change.

## 2021-12-09 NOTE — Assessment & Plan Note (Signed)
Discussed ways to help with CPAP compliance.  We will work on nasal congestion. Plan-continue auto 4-20

## 2021-12-09 NOTE — Patient Instructions (Signed)
We really want you to use your CPAP anytime you sleep.  Try using otc Flonase (fluticasone)  1-2 sprays each nostril every night at bedtime. It will start working over 3-4 nights.  You can use otc Zzquil or Benadryl (diphenhydramine) to help you sleep more at night.

## 2021-12-14 ENCOUNTER — Encounter: Payer: 59 | Admitting: Student

## 2021-12-29 ENCOUNTER — Telehealth: Payer: Self-pay | Admitting: Internal Medicine

## 2021-12-30 MED ORDER — AMPHETAMINE-DEXTROAMPHET ER 30 MG PO CP24: 30.0000 mg | ORAL_CAPSULE | Freq: Every day | ORAL | 0 refills | Status: DC | PRN

## 2021-12-30 NOTE — Telephone Encounter (Signed)
Noted will close encounter. Nothing further needed at this time. ?

## 2021-12-30 NOTE — Telephone Encounter (Signed)
Informed patient refill has been sent to pharmacy.  °

## 2021-12-30 NOTE — Telephone Encounter (Signed)
Adderall refilled

## 2021-12-30 NOTE — Telephone Encounter (Signed)
Called patient and he states that he needed a refill of ADDERAL XR 30 mg [facility] [address]  ? ?Please advise Dr Maple Hudson  ?

## 2022-01-06 ENCOUNTER — Ambulatory Visit (INDEPENDENT_AMBULATORY_CARE_PROVIDER_SITE_OTHER): Payer: 59 | Admitting: Student

## 2022-01-06 VITALS — BP 141/81 | HR 60 | Temp 98.0°F | Ht 69.0 in | Wt 316.0 lb

## 2022-01-06 DIAGNOSIS — E118 Type 2 diabetes mellitus with unspecified complications: Secondary | ICD-10-CM

## 2022-01-06 DIAGNOSIS — N1831 Chronic kidney disease, stage 3a: Secondary | ICD-10-CM

## 2022-01-06 DIAGNOSIS — J3089 Other allergic rhinitis: Secondary | ICD-10-CM

## 2022-01-06 DIAGNOSIS — I10 Essential (primary) hypertension: Secondary | ICD-10-CM

## 2022-01-06 DIAGNOSIS — J302 Other seasonal allergic rhinitis: Secondary | ICD-10-CM | POA: Diagnosis not present

## 2022-01-06 DIAGNOSIS — E1165 Type 2 diabetes mellitus with hyperglycemia: Secondary | ICD-10-CM | POA: Diagnosis not present

## 2022-01-06 DIAGNOSIS — I5032 Chronic diastolic (congestive) heart failure: Secondary | ICD-10-CM

## 2022-01-06 DIAGNOSIS — I130 Hypertensive heart and chronic kidney disease with heart failure and stage 1 through stage 4 chronic kidney disease, or unspecified chronic kidney disease: Secondary | ICD-10-CM | POA: Diagnosis not present

## 2022-01-06 DIAGNOSIS — E1122 Type 2 diabetes mellitus with diabetic chronic kidney disease: Secondary | ICD-10-CM

## 2022-01-06 LAB — POCT GLYCOSYLATED HEMOGLOBIN (HGB A1C): Hemoglobin A1C: 10.2 % — AB (ref 4.0–5.6)

## 2022-01-06 LAB — GLUCOSE, CAPILLARY: Glucose-Capillary: 254 mg/dL — ABNORMAL HIGH (ref 70–99)

## 2022-01-06 MED ORDER — EMPAGLIFLOZIN 25 MG PO TABS: 25.0000 mg | ORAL_TABLET | Freq: Every day | ORAL | 2 refills | Status: DC

## 2022-01-06 MED ORDER — OLMESARTAN MEDOXOMIL 20 MG PO TABS: 20.0000 mg | ORAL_TABLET | Freq: Every day | ORAL | 3 refills | Status: DC

## 2022-01-06 MED ORDER — FLUTICASONE PROPIONATE 50 MCG/ACT NA SUSP: 1.0000 | Freq: Every day | NASAL | 2 refills | Status: DC

## 2022-01-06 MED ORDER — TIRZEPATIDE 2.5 MG/0.5ML ~~LOC~~ SOAJ: 2.5000 mg | SUBCUTANEOUS | 1 refills | Status: DC

## 2022-01-06 MED ORDER — CETIRIZINE HCL 10 MG PO TABS: 10.0000 mg | ORAL_TABLET | Freq: Every day | ORAL | 2 refills | Status: DC

## 2022-01-06 NOTE — Patient Instructions (Signed)
Douglas Chad Cordial., it was a pleasure seeing you today! ? ?Today we discussed: ?- I would like for you to re-start Jardiance and your weekly injections.  ? ?- I have re-filled olmesartan (blood pressure). ? ?- I have prescribed cetrizine (daily pill) and Flonase spray for allergies. ? ?I have ordered the following labs today: ? ?Lab Orders    ?     Glucose, capillary    ?     BMP8+Anion Gap    ?     POC Hbg A1C     ? ?I have ordered the following medication/changed the following medications:  ? ?Start the following medications: ?Meds ordered this encounter  ?Medications  ? empagliflozin (JARDIANCE) 25 MG TABS tablet  ?  Sig: Take 1 tablet (25 mg total) by mouth daily.  ?  Dispense:  90 tablet  ?  Refill:  2  ? olmesartan (BENICAR) 20 MG tablet  ?  Sig: Take 1 tablet (20 mg total) by mouth daily.  ?  Dispense:  90 tablet  ?  Refill:  3  ? tirzepatide (MOUNJARO) 2.5 MG/0.5ML Pen  ?  Sig: Inject 2.5 mg into the skin once a week.  ?  Dispense:  3 mL  ?  Refill:  1  ? cetirizine (ZYRTEC ALLERGY) 10 MG tablet  ?  Sig: Take 1 tablet (10 mg total) by mouth daily.  ?  Dispense:  30 tablet  ?  Refill:  2  ? fluticasone (FLONASE) 50 MCG/ACT nasal spray  ?  Sig: Place 1 spray into both nostrils daily.  ?  Dispense:  15.8 mL  ?  Refill:  2  ?  ? ?Follow-up:  1 month   ? ?Please make sure to arrive 15 minutes prior to your next appointment. If you arrive late, you may be asked to reschedule.  ? ?We look forward to seeing you next time. Please call our clinic at [phone] if you have any questions or concerns. The best time to call is Monday-Friday from 9am-4pm, but there is someone available 24/7. If after hours or the weekend, call the main hospital number and ask for the Internal Medicine Resident On-Call. If you need medication refills, please notify your pharmacy one week in advance and they will send [place] a request. ? ?Thank you for letting us take part in your care. Wishing you the best! ? ?Thank you, ?[name], MD ? ?

## 2022-01-08 LAB — BMP8+ANION GAP
Anion Gap: 12 mmol/L (ref 10.0–18.0)
BUN/Creatinine Ratio: 19 (ref 9–20)
BUN: 27 mg/dL — ABNORMAL HIGH (ref 6–24)
CO2: 21 mmol/L (ref 20–29)
Calcium: 8.9 mg/dL (ref 8.7–10.2)
Chloride: 103 mmol/L (ref 96–106)
Creatinine, Ser: 1.39 mg/dL — ABNORMAL HIGH (ref 0.76–1.27)
Glucose: 259 mg/dL — ABNORMAL HIGH (ref 70–99)
Potassium: 4.9 mmol/L (ref 3.5–5.2)
Sodium: 136 mmol/L (ref 134–144)
eGFR: 61 mL/min/{1.73_m2} (ref >59)

## 2022-01-08 NOTE — Assessment & Plan Note (Signed)
Plan to re-start ARB and SGLT2i and obtain BMET today. ? ?- Re-start olmesartan 20mg  daily ?- Re-start empagliflozin 25mg  daily ?- BMET today ?

## 2022-01-08 NOTE — Progress Notes (Signed)
? ?CC: regular follow-up ? ?HPI: ? ?[name]. is a 51 y.o. person with medical history as below presenting to [facility] for regular follow-up. ? ?Please see problem-based list for further details, assessments, and plans. ? ?Past Medical History:  ?Diagnosis Date  ? Acute pancreatitis 01/22/2019  ? Diabetes mellitus   ? GERD (gastroesophageal reflux disease)   ? History of alcohol abuse   ? History of cocaine use   ? History of echocardiogram   ? a. Echo 4/14: Moderate LVH, vigorous LVEF, EF 65-70%, normal wall motion, grade 2 diastolic dysfunction, mildly dilated aortic root and ascending aorta, ascending aorta 40 mm, aortic root 38 mm, mild LAE  ? Hx of cardiovascular stress test   ? a. GXT 5/14: No ischemic changes  //  b. ETT-Myoview 3/16:  Low risk, no ischemia, EF 58%  ? Hyperlipidemia   ? Hypertension   ? Morbid obesity ([place])   ? Paroxysmal atrial fibrillation (H[facility])   ? Occurring in 2008, with several recurrence since then (including in the setting of + cocaine on UDS).  ? Sleep apnea   ? uses CPAP  ? Stroke [facility])   ? x3  ? SVT (supraventricular tachycardia) ([place])   ? 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 ?Review of Systems:  As per HPI ? ?Physical Exam: ? ?Vitals:  ? 01/06/22 0911  ?BP: (!) 141/81  ?Pulse: 60  ?Temp: 98 ?F (36.7 ?C)  ?TempSrc: Oral  ?SpO2: 98%  ?Weight: (!) 316 lb (143.3 kg)  ?Height: 5\' 9"  (1.753 m)  ? ?General: Resting comfortably in no acute distress ?HENT: Normocephalic, atraumatic. ?CV: Regular rate, rhythm. No murmurs appreciated. Distal pulses 2+ bilaterally. ?Pulm: Normal respiratory effort on room air. Clear to ausculation bilaterally. ?MSK: Normal bulk, tone. No pitting edema bilateral lower extremities. ?Skin: Warm, dry. No rashes or lesions appreciated. ?Neuro: Awake, alert, conversing appropriately. ?Psych: Normal mood, affect, speech. ? ?Assessment & Plan:  ? ?Type 2 diabetes mellitus with complications ([place]) ?[name] is presenting today for regular follow-up. He reports  he has not taken his weekly Mounjaro injection since December. He states he did not have difficulty obtaining the medication, but "got lazy" and stopped taking them. Also reports he has been out of Jardiance. He denies polyuria, polydipsia, dizziness, cold sweats.  ? ?A1c today 10.2% from 9.0%. His glycemic control has gradually worsened over the last year. It appears it is likely due to medication non-adherence and dietary indiscretion. I counseled [name] over the importance of glycemic control and taking his weekly injection. Patient verbalized understanding.  ? ?- Mounjaro 2.5mg  weekly x4 weeks, then up-titrate ?- Re-start Jardiance 25mg  daily ?- Metformin 1000mg  twice daily ?- BMET today ? ?Chronic diastolic heart failure ([facility]) ?Patient's weight mildly increased from previous visit, 143.3kg from 141.7kg four months ago. Today he appears euvolemic and is not reporting changes in chronic dyspnea on exertion, orthopnea, paroxysmal nocturnal dyspnea, or leg swelling. We will ensure patient re-starts SGLT2i today. ? ?- Re-start Jardiance 25mg  daily ? ?Hypertension ?BP Readings from Last 3 Encounters:  ?01/06/22 (!) 141/81  ?12/09/21 140/88  ?09/26/21 (!) 101/32  ? ?Blood pressure mildly elevated today. He denies chest pain, lightheadedness, dizziness. He has not been taking olmesartan recently, he is unsure why. He does report taking Adderall daily for narcolepsy, likely contributing to his elevated readings. We will plan to res-tart olmesartan today and obtain BMET. ? ?- Re-start olmesartan 20mg  daily ?- Metoprolol succinate 100mg  daily ?- Amlodipine 10mg   daily ?- BMET today ? ?Seasonal and perennial allergic rhinitis ?[name] reports post-nasal drip and stuffy nose that occurs seasonally. He previously was on nasal spray, which he states helped his symptoms. He denies recent fevers, chills, body aches, dyspnea, cough, chest pain, sick contacts. He is requesting an oral medication for this as well. ? ?- Start  daily Flonase ?- Start daily cetirizine 10mg   ? ?Chronic kidney disease, stage 3 ([place]) ?Plan to re-start ARB and SGLT2i and obtain BMET today. ? ?- Re-start olmesartan 20mg  daily ?- Re-start empagliflozin 25mg  daily ?- BMET today ? ? ?Patient discussed with Dr. Dareen Piano ? ?[name], MD ?Internal Medicine PGY-2 ?Pager: [phone] ? ?

## 2022-01-08 NOTE — Assessment & Plan Note (Signed)
[name] reports post-nasal drip and stuffy nose that occurs seasonally. He previously was on nasal spray, which he states helped his symptoms. He denies recent fevers, chills, body aches, dyspnea, cough, chest pain, sick contacts. He is requesting an oral medication for this as well. ? ?- Start daily Flonase ?- Start daily cetirizine 10mg   ?

## 2022-01-08 NOTE — Assessment & Plan Note (Signed)
BP Readings from Last 3 Encounters:  ?01/06/22 (!) 141/81  ?12/09/21 140/88  ?09/26/21 (!) 101/32  ? ?Blood pressure mildly elevated today. He denies chest pain, lightheadedness, dizziness. He has not been taking olmesartan recently, he is unsure why. He does report taking Adderall daily for narcolepsy, likely contributing to his elevated readings. We will plan to res-tart olmesartan today and obtain BMET. ? ?- Re-start olmesartan 20mg  daily ?- Metoprolol succinate 100mg  daily ?- Amlodipine 10mg  daily ?- BMET today ?

## 2022-01-08 NOTE — Assessment & Plan Note (Signed)
[name] is presenting today for regular follow-up. He reports he has not taken his weekly Mounjaro injection since December. He states he did not have difficulty obtaining the medication, but "got lazy" and stopped taking them. Also reports he has been out of Jardiance. He denies polyuria, polydipsia, dizziness, cold sweats.  ? ?A1c today 10.2% from 9.0%. His glycemic control has gradually worsened over the last year. It appears it is likely due to medication non-adherence and dietary indiscretion. I counseled [name] over the importance of glycemic control and taking his weekly injection. Patient verbalized understanding.  ? ?- Mounjaro 2.5mg  weekly x4 weeks, then up-titrate ?- Re-start Jardiance 25mg  daily ?- Metformin 1000mg  twice daily ?- BMET today ?

## 2022-01-08 NOTE — Assessment & Plan Note (Signed)
Patient's weight mildly increased from previous visit, 143.3kg from 141.7kg four months ago. Today he appears euvolemic and is not reporting changes in chronic dyspnea on exertion, orthopnea, paroxysmal nocturnal dyspnea, or leg swelling. We will ensure patient re-starts SGLT2i today. ? ?- Re-start Jardiance 25mg  daily ?

## 2022-01-09 NOTE — Progress Notes (Signed)
Internal Medicine Clinic Attending ? ?Case discussed with Dr. Braswell  At the time of the visit.  We reviewed the resident?s history and exam and pertinent patient test results.  I agree with the assessment, diagnosis, and plan of care documented in the resident?s note.  ?

## 2022-01-30 ENCOUNTER — Telehealth: Payer: Self-pay | Admitting: Internal Medicine

## 2022-01-30 MED ORDER — AMPHETAMINE-DEXTROAMPHET ER 30 MG PO CP24: 30.0000 mg | ORAL_CAPSULE | Freq: Every day | ORAL | 0 refills | Status: DC | PRN

## 2022-01-30 NOTE — Telephone Encounter (Addendum)
ATC patient, left message letting him know I'm sending message to Dr. Maple Hudson about refill of Adderall XR 30mg . Please advise ?[facility]- [facility] [address]  ?

## 2022-01-30 NOTE — Telephone Encounter (Signed)
Left message for patient letting him know that Dr. Annamaria Boots has sent in his refill to preferred pharmacy. Advised him to call back with any questions or concerns. Nothing further needed at this time.  ?

## 2022-01-30 NOTE — Telephone Encounter (Signed)
Adderall refilled

## 2022-02-01 ENCOUNTER — Other Ambulatory Visit: Payer: Self-pay | Admitting: Internal Medicine

## 2022-02-01 DIAGNOSIS — I480 Paroxysmal atrial fibrillation: Secondary | ICD-10-CM

## 2022-02-10 ENCOUNTER — Ambulatory Visit (INDEPENDENT_AMBULATORY_CARE_PROVIDER_SITE_OTHER): Payer: 59 | Admitting: Internal Medicine

## 2022-02-10 ENCOUNTER — Other Ambulatory Visit: Payer: Self-pay

## 2022-02-10 VITALS — BP 100/64 | HR 71 | Temp 98.6°F | Resp 28 | Ht 69.0 in | Wt 306.0 lb

## 2022-02-10 DIAGNOSIS — I10 Essential (primary) hypertension: Secondary | ICD-10-CM

## 2022-02-10 NOTE — Patient Instructions (Signed)
Thank you, Mr.Terin Ardi Alling. for allowing [place] to provide your care today. Today we discussed blood pressure.   ? ?Labs/Tests Ordered: ? ?Lab Orders    ?     BMP8+Anion Gap     ? ?Referrals Ordered:  ?Referral Orders  ?No referral(s) requested today  ?  ? ?Medication Changes:  ?There are no discontinued medications.  ? ?No orders of the defined types were placed in this encounter. ?  ? ?Health Maintenance Screening: ?Diabetes Health Maintenance Due  ?Topic Date Due  ? HEMOGLOBIN A1C  07/09/2022  ? FOOT EXAM  08/04/2022  ? OPHTHALMOLOGY EXAM  08/08/2022  ?  ? ?Instructions:  ? ?Follow up: 2-3 months  ? ?Remember: If you have any questions or concerns, call our clinic at [phone] or after hours call [phone] and ask for the internal medicine resident on call. ? ?Marianna Payment, D.O. ?[place] ? ?  ?

## 2022-02-10 NOTE — Progress Notes (Signed)
? ? ?Subjective:  ?CC: HTN ? ?HPI: ? ?[name] is a 51 y.o. male with a past medical history stated below and presents today for HTN. Please see problem based assessment and plan for additional details. ? ?Past Medical History:  ?Diagnosis Date  ? Acute pancreatitis 01/22/2019  ? Diabetes mellitus   ? GERD (gastroesophageal reflux disease)   ? History of alcohol abuse   ? History of cocaine use   ? History of echocardiogram   ? a. Echo 4/14: Moderate LVH, vigorous LVEF, EF 65-70%, normal wall motion, grade 2 diastolic dysfunction, mildly dilated aortic root and ascending aorta, ascending aorta 40 mm, aortic root 38 mm, mild LAE  ? Hx of cardiovascular stress test   ? a. GXT 5/14: No ischemic changes  //  b. ETT-Myoview 3/16:  Low risk, no ischemia, EF 58%  ? Hyperlipidemia   ? Hypertension   ? Morbid obesity ([place])   ? Paroxysmal atrial fibrillation (H[facility])   ? Occurring in 2008, with several recurrence since then (including in the setting of + cocaine on UDS).  ? Sleep apnea   ? uses CPAP  ? Stroke [facility])   ? x3  ? SVT (supraventricular tachycardia) ([place])   ? 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 ? ?Current Outpatient Medications on File Prior to Visit  ?Medication Sig Dispense Refill  ? Accu-Chek Softclix Lancets lancets TEST UP TO 4 TIMES A DAY 100 each 2  ? amLODipine (NORVASC) 10 MG tablet Take 1 tablet (10 mg total) by mouth daily. 30 tablet 11  ? amphetamine-dextroamphetamine (ADDERALL XR) 30 MG 24 hr capsule Take 1 capsule (30 mg total) by mouth daily as needed (focus). 1 daily as needed 30 capsule 0  ? aspirin 81 MG EC tablet Take 1 tablet (81 mg total) by mouth daily. Swallow whole. 90 tablet 1  ? atorvastatin (LIPITOR) 80 MG tablet Take 1 tablet (80 mg total) by mouth daily. 90 tablet 3  ? blood glucose meter kit and supplies KIT Dispense based on patient and insurance preference. Use up to four times daily as directed. 1 each 0  ? Blood Pressure Monitoring (BLOOD PRESSURE MONITOR/L CUFF) MISC 1  Units by Does not apply route daily. 1 each 0  ? cetirizine (ZYRTEC ALLERGY) 10 MG tablet Take 1 tablet (10 mg total) by mouth daily. 30 tablet 2  ? ELIQUIS 5 MG TABS tablet TAKE 1 TABLET BY MOUTH TWICE A DAY 60 tablet 5  ? empagliflozin (JARDIANCE) 25 MG TABS tablet Take 1 tablet (25 mg total) by mouth daily. 90 tablet 2  ? flecainide (TAMBOCOR) 100 MG tablet TAKE 1 TABLET BY MOUTH TWICE A DAY 60 tablet 7  ? fluticasone (FLONASE) 50 MCG/ACT nasal spray Place 1 spray into both nostrils daily. 15.8 mL 2  ? metFORMIN (GLUCOPHAGE) 1000 MG tablet Take 1 tablet (1,000 mg total) by mouth 2 (two) times daily with a meal. 120 tablet 3  ? metoprolol succinate (TOPROL XL) 100 MG 24 hr tablet Take 1 tablet (100 mg total) by mouth daily. Take with or immediately following a meal. 90 tablet 1  ? nicotine (NICODERM CQ - DOSED IN MG/24 HOURS) 21 mg/24hr patch PLACE 1 PATCH (21 MG TOTAL) ONTO THE SKIN DAILY. 28 patch 1  ? nicotine (NICODERM CQ) 21 mg/24hr patch Place 1 patch (21 mg total) onto the skin daily. 28 patch 0  ? pantoprazole (PROTONIX) 40 MG tablet Take 1 tablet (40 mg total) by mouth  daily. 90 tablet 3  ? polyethylene glycol (MIRALAX) 17 g packet Take 17 g by mouth daily. 14 each 0  ? tirzepatide (MOUNJARO) 2.5 MG/0.5ML Pen Inject 2.5 mg into the skin once a week. 3 mL 1  ? ?No current facility-administered medications on file prior to visit.  ? ? ?Family History  ?Problem Relation Age of Onset  ? Diabetes Mother   ? Heart attack Father 42  ? Stroke Maternal Grandmother   ? Stroke Paternal Grandmother   ? Diabetes Paternal Grandfather   ? ? ?Social History  ? ?Socioeconomic History  ? Marital status: Single  ?  Spouse name: Not on file  ? Number of children: 0  ? Years of education: 22  ? Highest education level: Not on file  ?Occupational History  ? Occupation: Unemployed  ?  Employer: UNEMPLOYED  ?Tobacco Use  ? Smoking status: Every Day  ?  Packs/day: 0.50  ?  Years: 28.00  ?  Pack years: 14.00  ?  Types: Cigarettes   ? Smokeless tobacco: Former  ?  Types: Snuff  ? Tobacco comments:  ?  1 pack per week [facility] 12/09/2021  ?Vaping Use  ? Vaping Use: Never used  ?Substance and Sexual Activity  ? Alcohol use: Yes  ?  Alcohol/week: 6.0 standard drinks  ?  Types: 6 Standard drinks or equivalent per week  ?  Comment: rarely  ? Drug use: Not Currently  ?  Comment: cocaine in the past, none currently  ? Sexual activity: Not on file  ?Other Topics Concern  ? Not on file  ?Social History Narrative  ? Fun: Drag racing   ?   ? Lives in [place] with sister.  ?   ? Unemployed, was working a Land job  ? ?Social Determinants of Health  ? ?Financial Resource Strain: Not on file  ?Food Insecurity: Not on file  ?Transportation Needs: Not on file  ?Physical Activity: Not on file  ?Stress: Not on file  ?Social Connections: Not on file  ?Intimate Partner Violence: Not on file  ? ? ?Review of Systems: ?ROS negative except for what is noted on the assessment and plan. ? ?Objective:  ? ?Vitals:  ? 02/10/22 0936  ?BP: 100/64  ?Pulse: 71  ?Resp: (!) 28  ?Temp: 98.6 ?F (37 ?C)  ?TempSrc: Oral  ?SpO2: 99%  ?Weight: (!) 306 lb (138.8 kg)  ?Height: $RemoveB'5\' 9"'UCTLEDMq$  (1.753 m)  ? ? ?Physical Exam: ?Gen: A&O x3 and in no apparent distress, well appearing and nourished. ?Neck: no masses or nodules, AROM intact. ?CV: RRR, no murmurs, S1/S2 presents  ?Resp: Clear to ascultation bilaterally  ?MSK: Grossly normal AROM and strength x4 extremities. ?Skin: good skin turgor, no rashes, unusual bruising, or prominent lesions.  ? ? ? ?Assessment & Plan:  ?See Encounters Tab for problem based charting. ? ?No problem-specific Assessment & Plan notes found for this encounter. ? ? ?Patient discussed with Dr. Jimmye Norman ? ? ?Marianna Payment, D.O. ?[facility] [facility]  PGY-3 ?Pager: [phone]  Phone: [phone] ?Date 02/12/2022  Time 6:43 AM ? ?

## 2022-02-11 LAB — BMP8+ANION GAP
Anion Gap: 15 mmol/L (ref 10.0–18.0)
BUN/Creatinine Ratio: 21 — ABNORMAL HIGH (ref 9–20)
BUN: 37 mg/dL — ABNORMAL HIGH (ref 6–24)
CO2: 19 mmol/L — ABNORMAL LOW (ref 20–29)
Calcium: 9.3 mg/dL (ref 8.7–10.2)
Chloride: 104 mmol/L (ref 96–106)
Creatinine, Ser: 1.79 mg/dL — ABNORMAL HIGH (ref 0.76–1.27)
Glucose: 176 mg/dL — ABNORMAL HIGH (ref 70–99)
Potassium: 5.7 mmol/L — ABNORMAL HIGH (ref 3.5–5.2)
Sodium: 138 mmol/L (ref 134–144)
eGFR: 45 mL/min/{1.73_m2} — ABNORMAL LOW (ref >59)

## 2022-02-11 MED ORDER — OLMESARTAN MEDOXOMIL 20 MG PO TABS: 10.0000 mg | ORAL_TABLET | Freq: Every day | ORAL | 3 refills | Status: DC

## 2022-02-12 ENCOUNTER — Encounter: Payer: Self-pay | Admitting: Internal Medicine

## 2022-02-12 NOTE — Assessment & Plan Note (Signed)
HPI: ?Patient presents for further evaluation and management of HTN.  Patient is currently taking metoprolol 100 mg daily, amlodipine 10 mg daily, and olmesartan 20 mg daily.  He is tolerating his medications well but does state that he is feeling a little bit more tired when exercising.  Blood pressures listed below. ? ?Recent blood pressures: ? ?BP Readings from Last 3 Encounters:  ?02/10/22 100/64  ?01/06/22 (!) 141/81  ?12/09/21 140/88  ? ? ?Assessment/Plan: ?Controlled hypertension: ?-Considering patient was recently restarted on olmesartan, we will get a repeat BMP to assess kidney function. ?-Depending on BMP, will titrate blood pressure medications to avoid hypotension ? ? ?**ADDENDUM** ? ? ?  Latest Ref Rng & Units 02/10/2022  ? 10:29 AM 01/06/2022  ?  9:49 AM 09/26/2021  ? 12:40 PM  ?BMP  ?Glucose 70 - 99 mg/dL 786   767   209    ?BUN 6 - 24 mg/dL 37   27   27    ?Creatinine 0.76 - 1.27 mg/dL 4.70   9.62   8.36    ?BUN/Creat Ratio 9 - 20 21   19      ?Sodium 134 - 144 mmol/L 138   136   139    ?Potassium 3.5 - 5.2 mmol/L 5.7   4.9   4.9    ?Chloride 96 - 106 mmol/L 104   103   105    ?CO2 20 - 29 mmol/L 19   21   24     ?Calcium 8.7 - 10.2 mg/dL 9.3   8.9   8.7    ? ? ?BMP results show no acute jump in his creatinine to 1.79 with a potassium of 5.7.  I called patient to inform him of his results.  I counseled him to continue to drink plenty of fluids and to decrease his olmesartan to 10 mg daily.  Patient endorsed frequent urination and significant nocturia.  Advised him to make a follow-up with clinic appointment this week to further evaluate his nocturia.  Patient will likely need a postvoid residual to ensure there is no evidence of obstructive nephropathy.  He will need additional work-up for BPH if he has not done so already.  Will likely need to repeat BMP at that time to ensure potassium has gone down.  Patient did endorse eating multiple bananas in the day.  I counseled him to decrease his intake of  bananas to at least once a day. ?

## 2022-02-16 NOTE — Progress Notes (Signed)
Internal Medicine Clinic Attending ? ?Case discussed with Dr. Marchia Bond  At the time of the visit.  We reviewed the resident?s history and exam and pertinent patient test results.  I agree with the assessment, diagnosis, and plan of care documented in the resident?s note, including instructions for increased fluid intake, decrease in ARB dose, and close f/u.  ?

## 2022-02-17 ENCOUNTER — Encounter: Payer: 59 | Admitting: Internal Medicine

## 2022-02-20 ENCOUNTER — Encounter: Payer: 59 | Admitting: Internal Medicine

## 2022-02-22 ENCOUNTER — Encounter: Payer: Self-pay | Admitting: Student

## 2022-02-28 ENCOUNTER — Telehealth: Payer: Self-pay | Admitting: Internal Medicine

## 2022-03-01 MED ORDER — AMPHETAMINE-DEXTROAMPHET ER 30 MG PO CP24: 30.0000 mg | ORAL_CAPSULE | Freq: Every day | ORAL | 0 refills | Status: DC | PRN

## 2022-03-01 NOTE — Telephone Encounter (Signed)
Patient checking on message left yesterday. Patient phone number is [phone]. ?

## 2022-03-01 NOTE — Telephone Encounter (Signed)
Adderall refilled

## 2022-03-01 NOTE — Telephone Encounter (Signed)
Dr. Annamaria Boots, please advise if you are okay refilling pt's Adderall. ? ?Allergies  ?Allergen Reactions  ? Shrimp [Shellfish Allergy] Anaphylaxis  ? Banana Other (See Comments)  ?  Pt gags  ? Watermelon Flavor Nausea And Vomiting  ? Almond Oil Itching  ?  Roof of mouth itches  ? Other Itching  ?  Grapes cause itching  ? Peanut-Containing Drug Products Itching and Cough  ? Sulfa Antibiotics Itching  ? ? ? ?Current Outpatient Medications:  ?  Accu-Chek Softclix Lancets lancets, TEST UP TO 4 TIMES A DAY, Disp: 100 each, Rfl: 2 ?  amLODipine (NORVASC) 10 MG tablet, Take 1 tablet (10 mg total) by mouth daily., Disp: 30 tablet, Rfl: 11 ?  amphetamine-dextroamphetamine (ADDERALL XR) 30 MG 24 hr capsule, Take 1 capsule (30 mg total) by mouth daily as needed (focus). 1 daily as needed, Disp: 30 capsule, Rfl: 0 ?  aspirin 81 MG EC tablet, Take 1 tablet (81 mg total) by mouth daily. Swallow whole., Disp: 90 tablet, Rfl: 1 ?  atorvastatin (LIPITOR) 80 MG tablet, Take 1 tablet (80 mg total) by mouth daily., Disp: 90 tablet, Rfl: 3 ?  blood glucose meter kit and supplies KIT, Dispense based on patient and insurance preference. Use up to four times daily as directed., Disp: 1 each, Rfl: 0 ?  Blood Pressure Monitoring (BLOOD PRESSURE MONITOR/L CUFF) MISC, 1 Units by Does not apply route daily., Disp: 1 each, Rfl: 0 ?  cetirizine (ZYRTEC ALLERGY) 10 MG tablet, Take 1 tablet (10 mg total) by mouth daily., Disp: 30 tablet, Rfl: 2 ?  ELIQUIS 5 MG TABS tablet, TAKE 1 TABLET BY MOUTH TWICE A DAY, Disp: 60 tablet, Rfl: 5 ?  empagliflozin (JARDIANCE) 25 MG TABS tablet, Take 1 tablet (25 mg total) by mouth daily., Disp: 90 tablet, Rfl: 2 ?  flecainide (TAMBOCOR) 100 MG tablet, TAKE 1 TABLET BY MOUTH TWICE A DAY, Disp: 60 tablet, Rfl: 7 ?  fluticasone (FLONASE) 50 MCG/ACT nasal spray, Place 1 spray into both nostrils daily., Disp: 15.8 mL, Rfl: 2 ?  metFORMIN (GLUCOPHAGE) 1000 MG tablet, Take 1 tablet (1,000 mg total) by mouth 2 (two) times  daily with a meal., Disp: 120 tablet, Rfl: 3 ?  metoprolol succinate (TOPROL XL) 100 MG 24 hr tablet, Take 1 tablet (100 mg total) by mouth daily. Take with or immediately following a meal., Disp: 90 tablet, Rfl: 1 ?  nicotine (NICODERM CQ - DOSED IN MG/24 HOURS) 21 mg/24hr patch, PLACE 1 PATCH (21 MG TOTAL) ONTO THE SKIN DAILY., Disp: 28 patch, Rfl: 1 ?  nicotine (NICODERM CQ) 21 mg/24hr patch, Place 1 patch (21 mg total) onto the skin daily., Disp: 28 patch, Rfl: 0 ?  olmesartan (BENICAR) 20 MG tablet, Take 0.5 tablets (10 mg total) by mouth daily., Disp: 90 tablet, Rfl: 3 ?  pantoprazole (PROTONIX) 40 MG tablet, Take 1 tablet (40 mg total) by mouth daily., Disp: 90 tablet, Rfl: 3 ?  polyethylene glycol (MIRALAX) 17 g packet, Take 17 g by mouth daily., Disp: 14 each, Rfl: 0 ?  tirzepatide (MOUNJARO) 2.5 MG/0.5ML Pen, Inject 2.5 mg into the skin once a week., Disp: 3 mL, Rfl: 1 ? ?

## 2022-03-02 ENCOUNTER — Other Ambulatory Visit: Payer: Self-pay | Admitting: Student

## 2022-03-02 DIAGNOSIS — I10 Essential (primary) hypertension: Secondary | ICD-10-CM

## 2022-03-09 ENCOUNTER — Encounter: Payer: Self-pay | Admitting: Internal Medicine

## 2022-03-09 ENCOUNTER — Other Ambulatory Visit: Payer: Self-pay | Admitting: Internal Medicine

## 2022-03-09 ENCOUNTER — Ambulatory Visit (INDEPENDENT_AMBULATORY_CARE_PROVIDER_SITE_OTHER): Payer: 59 | Admitting: Internal Medicine

## 2022-03-09 VITALS — BP 185/112 | HR 70 | Temp 97.6°F | Ht 69.0 in | Wt 300.6 lb

## 2022-03-09 DIAGNOSIS — J302 Other seasonal allergic rhinitis: Secondary | ICD-10-CM

## 2022-03-09 DIAGNOSIS — I130 Hypertensive heart and chronic kidney disease with heart failure and stage 1 through stage 4 chronic kidney disease, or unspecified chronic kidney disease: Secondary | ICD-10-CM | POA: Diagnosis not present

## 2022-03-09 DIAGNOSIS — Z7985 Long-term (current) use of injectable non-insulin antidiabetic drugs: Secondary | ICD-10-CM

## 2022-03-09 DIAGNOSIS — R3589 Other polyuria: Secondary | ICD-10-CM

## 2022-03-09 DIAGNOSIS — R3581 Nocturnal polyuria: Secondary | ICD-10-CM | POA: Insufficient documentation

## 2022-03-09 DIAGNOSIS — E113412 Type 2 diabetes mellitus with severe nonproliferative diabetic retinopathy with macular edema, left eye: Secondary | ICD-10-CM | POA: Insufficient documentation

## 2022-03-09 DIAGNOSIS — E1122 Type 2 diabetes mellitus with diabetic chronic kidney disease: Secondary | ICD-10-CM

## 2022-03-09 DIAGNOSIS — Z0000 Encounter for general adult medical examination without abnormal findings: Secondary | ICD-10-CM

## 2022-03-09 DIAGNOSIS — G4733 Obstructive sleep apnea (adult) (pediatric): Secondary | ICD-10-CM

## 2022-03-09 DIAGNOSIS — J3089 Other allergic rhinitis: Secondary | ICD-10-CM

## 2022-03-09 DIAGNOSIS — I5032 Chronic diastolic (congestive) heart failure: Secondary | ICD-10-CM

## 2022-03-09 DIAGNOSIS — N1831 Chronic kidney disease, stage 3a: Secondary | ICD-10-CM

## 2022-03-09 DIAGNOSIS — E1165 Type 2 diabetes mellitus with hyperglycemia: Secondary | ICD-10-CM

## 2022-03-09 DIAGNOSIS — E118 Type 2 diabetes mellitus with unspecified complications: Secondary | ICD-10-CM

## 2022-03-09 DIAGNOSIS — I480 Paroxysmal atrial fibrillation: Secondary | ICD-10-CM | POA: Diagnosis not present

## 2022-03-09 DIAGNOSIS — Z7984 Long term (current) use of oral hypoglycemic drugs: Secondary | ICD-10-CM

## 2022-03-09 DIAGNOSIS — Z6841 Body Mass Index (BMI) 40.0 and over, adult: Secondary | ICD-10-CM

## 2022-03-09 DIAGNOSIS — Z8673 Personal history of transient ischemic attack (TIA), and cerebral infarction without residual deficits: Secondary | ICD-10-CM

## 2022-03-09 DIAGNOSIS — F17210 Nicotine dependence, cigarettes, uncomplicated: Secondary | ICD-10-CM

## 2022-03-09 DIAGNOSIS — I10 Essential (primary) hypertension: Secondary | ICD-10-CM

## 2022-03-09 MED ORDER — ACCU-CHEK SOFTCLIX LANCETS MISC: 2 refills | Status: AC

## 2022-03-09 MED ORDER — OLMESARTAN MEDOXOMIL 40 MG PO TABS: 40.0000 mg | ORAL_TABLET | Freq: Every day | ORAL | 2 refills | Status: DC

## 2022-03-09 MED ORDER — GLUCOSE BLOOD VI STRP: ORAL_STRIP | 5 refills | Status: DC

## 2022-03-09 MED ORDER — MOUNJARO 5 MG/0.5ML ~~LOC~~ SOAJ: 5.0000 mg | SUBCUTANEOUS | 0 refills | Status: AC

## 2022-03-09 NOTE — Assessment & Plan Note (Addendum)
History of paroxysmal atrial fibrillation and CVA.  Currently rate controlled.  Regular rhythm on exam.  His current regimen includes flecainide 100 mg daily, Toprol XL 100 mg daily and Eliquis 5 mg twice daily.  Chronic and stable. Plan: -Continue metoprolol succinate, flecainide as above for rate and rhythm control -Continue Eliquis for anticoagulation

## 2022-03-09 NOTE — Assessment & Plan Note (Signed)
Up-to-date on screening.  Discussed shingles vaccination and COVID booster at next OV.  Will need hemoglobin A1c next month.

## 2022-03-09 NOTE — Assessment & Plan Note (Addendum)
Blood pressure today 145/81 and on recheck 185/112.  Antihypertensive regimen includes amlodipine 10 mg daily and olmesartan 10 mg daily.  Also on metoprolol succinate 100 mg daily. He endorses adherence though is taking full olmesartan 20 mg tablet daily instead of half dose.  He denies headaches, daily orthostatic symptoms.  Last BMP April 2023 which showed creatinine 1.79, and potassium 5.7.  Patient was instructed to drink a lot of water and follow-up in 1 month.  On assessment, patient's blood pressures uncontrolled on current regimen.  Suspect some medication nonadherence and dietary indiscretion.  We will increase olmesartan.  Patient also has history of OSA though recently has been compliant with CPAP.  We will recheck BMP today to follow-up on elevated creatinine and hyperkalemia. Plan: -Increase olmesartan to 40 mg daily -Continue amlodipine 10 mg daily -Continue metoprolol succinate 100 mg daily -Check BMP  ADDENDUM: BMP with critical high potassium of 6.5. Patient was contacted and informed of critically high potassium value and directed to go to the emergency department immediately. Patient was counseled that high potassium could put him at risk for arrhythmias which could result in death. Otherwise creatine and eGFR stable for CKD stage 3. Mild metabolic acidosis.

## 2022-03-09 NOTE — Assessment & Plan Note (Addendum)
Wt Readings from Last 3 Encounters:  03/09/22 (!) 300 lb 9.6 oz (136.4 kg)  02/10/22 (!) 306 lb (138.8 kg)  01/06/22 (!) 316 lb (143.3 kg)   BMI today 44. Patient has had 16 pound weight loss since March while on Mounjaro lowest dose.  He admits he has not adhered to a diet or exercise regimen.  Declined referral to [facility] for diabetes and nutrition counseling.  He reports he knows what he needs to do and is having difficulty following through.  Does report switched from sodas to other beverages.  Discussed sugars in fruit and fruit juice. Plan: -Increase Mounjaro to 5 mg weekly today, continues to increase at next OV -Continue lifestyle modifications -Healthy eating and diabetes print out provided

## 2022-03-09 NOTE — Assessment & Plan Note (Addendum)
The patient reports recently more compliant with CPAP and that this has caused him to be less fatigued in the mornings.  Discussed multiple benefits of treating OSA.  Encourage patient to use Flonase to help with nasal congestion to increase compliance as was recommended by pulmonology.  Patient reports he might need a new mask soon, and he will reach out to his CPAP supplier for this.

## 2022-03-09 NOTE — Progress Notes (Addendum)
CC: HTN follow up  HPI:  [name]. is a 51 y.o. male with a past medical history stated below.   Chronic diastolic heart failure [facility]) Patient presents for follow up. Most recent ECHO (TEE) from 05/2021 showed normal LV function, normal EF, no structural abnormalities.  Today, he denies SOB, has trace LEE. Weight down trending while on Mounjaro. No signs of volume overload on exam today. No need for diuresis at this time.   Atrial fibrillation (HCC) History of paroxysmal atrial fibrillation and CVA.  Currently rate controlled.  Regular rhythm on exam.  His current regimen includes flecainide 100 mg daily, Toprol XL 100 mg daily and Eliquis 5 mg twice daily.  Chronic and stable. Plan: -Continue metoprolol succinate, flecainide as above for rate and rhythm control -Continue Eliquis for anticoagulation  Benign essential hypertension Blood pressure today 145/81 and on recheck 185/112.  Antihypertensive regimen includes amlodipine 10 mg daily and olmesartan 10 mg daily.  Also on metoprolol succinate 100 mg daily. He endorses adherence though is taking full olmesartan 20 mg tablet daily instead of half dose.  He denies headaches, daily orthostatic symptoms.  Last BMP April 2023 which showed creatinine 1.79, and potassium 5.7.  Patient was instructed to drink a lot of water and follow-up in 1 month.  On assessment, patient's blood pressures uncontrolled on current regimen.  Suspect some medication nonadherence and dietary indiscretion.  We will increase olmesartan.  Patient also has history of OSA though recently has been compliant with CPAP.  We will recheck BMP today to follow-up on elevated creatinine and hyperkalemia. Plan: -Increase olmesartan to 40 mg daily -Continue amlodipine 10 mg daily -Continue metoprolol succinate 100 mg daily -Check BMP  ADDENDUM: BMP with critical high potassium of 6.5. Patient was contacted and informed of critically high potassium value and  directed to go to the emergency department immediately. Patient was counseled that high potassium could put him at risk for arrhythmias which could result in death. Otherwise creatine and eGFR stable for CKD stage 3. Mild metabolic acidosis.   Obstructive sleep apnea (adult) (pediatric) The patient reports recently more compliant with CPAP and that this has caused him to be less fatigued in the mornings.  Discussed multiple benefits of treating OSA.  Encourage patient to use Flonase to help with nasal congestion to increase compliance as was recommended by pulmonology.  Patient reports he might need a new mask soon, and he will reach out to his CPAP supplier for this.  Seasonal and perennial allergic rhinitis Encouraged patient to continue Zyrtec daily and Flonase as indicated for allergic rhinitis and to increase CPAP compliance.  Type 2 diabetes mellitus with complications (HCC) Last hemoglobin A1c from March 2023 10.2% he brings his glucose monitor today for review.  He is checking morning fasting blood sugars: 110s- 285.  He reports adherence to metformin 1000 mg twice daily, Jardiance 25 mg daily, and Mounjaro injections 2.5 mg weekly.  He is unsure if he is giving himself the injection correctly.  Patient endorses polyuria and polydipsia.  He denies symptoms of hypoglycemia.  He requests refill of test strips and lancets today.  On assessment of CBGs patient's type 2 diabetes is currently poorly controlled.  Suspect dietary discretion.  Patient may also not be giving his Mounjaro injection correctly and I encouraged him to discuss with his pharmacist to review how to give the injection and practice this.  Will increase Mounjaro today.  Patient declined referral to [facility], has met with her  before and does not think this will be helpful.  States he "knows what to do" he just "needs to bring himself to do it".  Congratulated patient on doing a good job getting his fasting blood sugars recorded over  the last month. Plan: -Increase Mounjaro to 5 mg weekly -Continue Jardiance 25 mg daily  -Continue metformin 1000 mg twice daily -Provided patient with healthy eating for patients with diabetes program -Continue checking fasting blood sugars -Refilled lancets and test strips -Follow-up in 1 month to continue to titrate up [facility] and for hemoglobin A1c check  Chronic kidney disease, stage 3 ([place]) Last BMP April 2023 which showed creatinine 1.79, BUN 37, EGFR 45, potassium 5.7.  Patient was instructed to continue to drink more water, eat less bananas, and follow-up in 1 week for recheck.  Patient's creatinine and EGFR has fluctuated a lot over the last few months, creatinine around 1.3-1.8.  eGFR 45- 60.  Patient olmesartan and Jardiance. Plan: -Recheck BMP today -Continue Jardiance 25 mg daily -Olmesartan was increased today to 40 mg daily  ADDENDUM: BMP with critical high potassium of 6.5. Patient was contacted and informed of critically high potassium value and directed to go to the emergency department immediately. Patient was counseled that high potassium could put him at risk for arrhythmias which could result in death. Otherwise creatine and eGFR stable for CKD stage 3. Mild metabolic acidosis.  Obesity Wt Readings from Last 3 Encounters:  03/09/22 (!) 300 lb 9.6 oz (136.4 kg)  02/10/22 (!) 306 lb (138.8 kg)  01/06/22 (!) 316 lb (143.3 kg)   BMI today 44. Patient has had 16 pound weight loss since March while on Mounjaro lowest dose.  He admits he has not adhered to a diet or exercise regimen.  Declined referral to [facility] for diabetes and nutrition counseling.  He reports he knows what he needs to do and is having difficulty following through.  Does report switched from sodas to other beverages.  Discussed sugars in fruit and fruit juice. Plan: -Increase Mounjaro to 5 mg weekly today, continues to increase at next OV -Continue lifestyle modifications -Healthy eating and diabetes  print out provided  Healthcare maintenance Up-to-date on screening.  Discussed shingles vaccination and COVID booster at next OV.  Will need hemoglobin A1c next month.  History of CVA (cerebrovascular accident) Patient at his neurologic baseline.  He is on atorvastatin 80 mg daily, Eliquis 5 mg twice daily, and multiple medications for diabetes for management of risk factors for stroke prevention.  Nocturnal polyuria Patient presents for urinary frequency/nocturia.  He reports getting up to urinate at night 5 times a night.  He reports prior bladder ultrasound showed he is not emptying his bladder fully.  He denies urinary incontinence.  Hemoglobin A1c in March 2023 10.2% and he has elevated fasting CBGs on review of his glucometer readings today.  His last UA showed greater than 500 glucose suggesting significant current glucosuria.   Polyuria likely multifactorial from hypoglycemia/glucosuria, as well as from possible BPH.  Patient declined prostate exam today, though discussed as we start to get better control of his diabetes he will need a prostate exam to evaluate for enlarged prostate.  Counseled that we can start medication for this if this is the case. Plan: -UA to check for degree of glucosuria -We will need prostate exam at some point if he agrees as well as PSA if prostate is enlarged. -Continue management of diabetes as above    Past Medical History:  Diagnosis Date   Acute pancreatitis 01/22/2019   Diabetes mellitus    GERD (gastroesophageal reflux disease)    History of alcohol abuse    History of cocaine use    History of echocardiogram    a. Echo 4/14: Moderate LVH, vigorous LVEF, EF 65-70%, normal wall motion, grade 2 diastolic dysfunction, mildly dilated aortic root and ascending aorta, ascending aorta 40 mm, aortic root 38 mm, mild LAE   Hx of cardiovascular stress test    a. GXT 5/14: No ischemic changes  //  b. ETT-Myoview 3/16:  Low risk, no ischemia, EF 58%    Hyperlipidemia    Hypertension    Morbid obesity ([place])    Paroxysmal atrial fibrillation ([place])    Occurring in 2008, with several recurrence since then (including in the setting of + cocaine on UDS).   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 TAKE WITH OR IMMEDIATELY FOLLOWING A MEAL. 90 tablet 3   amLODipine (NORVASC) 10 MG tablet Take 1 tablet (10 mg total) by mouth daily. 30 tablet 11   amphetamine-dextroamphetamine (ADDERALL XR) 30 MG 24 hr capsule Take 1 capsule (30 mg total) by mouth daily as needed (focus). 1 daily as needed 30 capsule 0   aspirin 81 MG EC tablet Take 1 tablet (81 mg total) by mouth daily. Swallow whole. 90 tablet 1   atorvastatin (LIPITOR) 80 MG tablet Take 1 tablet (80 mg total) by mouth daily. 90 tablet 3   blood glucose meter kit and supplies KIT Dispense based on patient and insurance preference. Use up to four times daily as directed. 1 each 0   Blood Pressure Monitoring (BLOOD PRESSURE MONITOR/L CUFF) MISC 1 Units by Does not apply route daily. 1 each 0   cetirizine (ZYRTEC ALLERGY) 10 MG tablet Take 1 tablet (10 mg total) by mouth daily. 30 tablet 2   ELIQUIS 5 MG TABS tablet TAKE 1 TABLET BY MOUTH TWICE A DAY 60 tablet 5   empagliflozin (JARDIANCE) 25 MG TABS tablet Take 1 tablet (25 mg total) by mouth daily. 90 tablet 2   flecainide (TAMBOCOR) 100 MG tablet TAKE 1 TABLET BY MOUTH TWICE A DAY 60 tablet 7   fluticasone (FLONASE) 50 MCG/ACT nasal spray Place 1 spray into both nostrils daily. 15.8 mL 2   metFORMIN (GLUCOPHAGE) 1000 MG tablet Take 1 tablet (1,000 mg total) by mouth 2 (two) times daily with a meal. 120 tablet 3   nicotine (NICODERM CQ - DOSED IN MG/24 HOURS) 21 mg/24hr patch PLACE 1 PATCH (21 MG  TOTAL) ONTO THE SKIN DAILY. 28 patch 1   nicotine (NICODERM CQ) 21 mg/24hr patch Place 1 patch (21 mg total) onto the skin daily. 28 patch 0   pantoprazole (PROTONIX) 40 MG tablet Take 1 tablet (40 mg total) by mouth daily. 90 tablet 3   polyethylene glycol (MIRALAX) 17 g packet Take 17 g by mouth daily. 14 each 0   No current facility-administered medications on file prior to visit.    Family History  Problem Relation Age of Onset   Diabetes Mother    Heart attack Father 49   Stroke Maternal Grandmother    Stroke Paternal  Grandmother    Diabetes Paternal Grandfather     Review of Systems: ROS negative except for what is noted on the assessment and plan.  Vitals:   03/09/22 0904 03/09/22 0927  BP: (!) 145/81 (!) 185/112  Pulse: 71 70  Temp: 97.6 F (36.4 C)   TempSrc: Oral   SpO2: 100%   Weight: (!) 300 lb 9.6 oz (136.4 kg)   Height: $Remove'5\' 9"'ydthiRo$  (1.753 m)      Physical Exam: General: Well appearing obese african [place] male, NAD HENT: normocephalic, atraumatic, external ears and nares appear unremarkable EYES: conjunctiva non-erythematous, no scleral icterus CV: regular rate, normal rhythm, no murmurs, rubs, gallops. Trace LEE. Pulmonary: normal work of breathing on RA, lungs clear to auscultation, no rales, wheezes, rhonchi Skin: Warm and dry, no rashes or lesions on exposed surfaces Neurological: MS: awake, alert and oriented x3, normal speech and fund of knowledge Motor: moves all extremities antigravity Psych: normal affect     See Encounters Tab for problem based charting.  Patient discussed with Dr. Georgana Curio, [name] [facility], PGY-1 Pager: [phone] Date 03/10/2022 Time 12:36 PM

## 2022-03-09 NOTE — Patient Instructions (Signed)
Thank you, [name] for allowing [place] to provide your care today. Today we discussed:   Diabetes: -We will check your urine today for sugar -I suspect your high sugar levels are causing you to have to urinate more frequently to get rid of all the extra sugar. -I increased your Mounjaro to 5.0mg  weekly -Please continue checking your blood sugars you are doing a great job checking recently! -I refilled the test strips and lancets -Continue taking your Metformin and Jardiance  High Blood Pressure: -Your blood pressure was still high today -I increased your olmesartan to 40mg  daily -We will check your kidney function today -Continue taking your amlodipine  Frequent urination: -If your blood sugars are improving and you are still getting up frequently to use the restroom at night, we should do a prostate exam at your next OV. There are medicines we can try for an enlarged prostate.   I have ordered the following labs for you:   Lab Orders         BMP8+Anion Gap         Urinalysis, Reflex Microscopic      My Chart Access: [email or web address]  Please follow-up in 1 month for diabetes and blood pressure check.  Please make sure to arrive 15 minutes prior to your next appointment. If you arrive late, you may be asked to reschedule.    We look forward to seeing you next time. Please call our clinic at [phone] if you have any questions or concerns. The best time to call is Monday-Friday from 9am-4pm, but there is someone available 24/7. If after hours or the weekend, call the main hospital number and ask for the Internal Medicine Resident On-Call. If you need medication refills, please notify your pharmacy one week in advance and they will send [place] a request.   Thank you for letting us take part in your care. Wishing you the best!  Wayland Denis, MD 03/09/2022, 9:41 AM IM Resident, PGY-1

## 2022-03-09 NOTE — Assessment & Plan Note (Signed)
Patient at his neurologic baseline.  He is on atorvastatin 80 mg daily, Eliquis 5 mg twice daily, and multiple medications for diabetes for management of risk factors for stroke prevention.

## 2022-03-09 NOTE — Assessment & Plan Note (Signed)
Encouraged patient to continue Zyrtec daily and Flonase as indicated for allergic rhinitis and to increase CPAP compliance.

## 2022-03-09 NOTE — Assessment & Plan Note (Addendum)
Last BMP April 2023 which showed creatinine 1.79, BUN 37, EGFR 45, potassium 5.7.  Patient was instructed to continue to drink more water, eat less bananas, and follow-up in 1 week for recheck.  Patient's creatinine and EGFR has fluctuated a lot over the last few months, creatinine around 1.3-1.8.  eGFR 45- 60.  Patient olmesartan and Jardiance. Plan: -Recheck BMP today -Continue Jardiance 25 mg daily -Olmesartan was increased today to 40 mg daily  ADDENDUM: BMP with critical high potassium of 6.5. Patient was contacted and informed of critically high potassium value and directed to go to the emergency department immediately. Patient was counseled that high potassium could put him at risk for arrhythmias which could result in death. Otherwise creatine and eGFR stable for CKD stage 3. Mild metabolic acidosis.

## 2022-03-09 NOTE — Assessment & Plan Note (Addendum)
Last hemoglobin A1c from March 2023 10.2% he brings his glucose monitor today for review.  He is checking morning fasting blood sugars: 110s- 285.  He reports adherence to metformin 1000 mg twice daily, Jardiance 25 mg daily, and Mounjaro injections 2.5 mg weekly.  He is unsure if he is giving himself the injection correctly.  Patient endorses polyuria and polydipsia.  He denies symptoms of hypoglycemia.  He requests refill of test strips and lancets today.  On assessment of CBGs patient's type 2 diabetes is currently poorly controlled.  Suspect dietary discretion.  Patient may also not be giving his Mounjaro injection correctly and I encouraged him to discuss with his pharmacist to review how to give the injection and practice this.  Will increase Mounjaro today.  Patient declined referral to [facility], has met with her before and does not think this will be helpful.  States he "knows what to do" he just "needs to bring himself to do it".  Congratulated patient on doing a good job getting his fasting blood sugars recorded over the last month. Plan: -Increase Mounjaro to 5 mg weekly -Continue Jardiance 25 mg daily  -Continue metformin 1000 mg twice daily -Provided patient with healthy eating for patients with diabetes program -Continue checking fasting blood sugars -Refilled lancets and test strips -Follow-up in 1 month to continue to titrate up Mounjaro and for hemoglobin A1c check

## 2022-03-09 NOTE — Assessment & Plan Note (Signed)
Patient presents for urinary frequency/nocturia.  He reports getting up to urinate at night 5 times a night.  He reports prior bladder ultrasound showed he is not emptying his bladder fully.  He denies urinary incontinence.  Hemoglobin A1c in March 2023 10.2% and he has elevated fasting CBGs on review of his glucometer readings today.  His last UA showed greater than 500 glucose suggesting significant current glucosuria.   Polyuria likely multifactorial from hypoglycemia/glucosuria, as well as from possible BPH.  Patient declined prostate exam today, though discussed as we start to get better control of his diabetes he will need a prostate exam to evaluate for enlarged prostate.  Counseled that we can start medication for this if this is the case. Plan: -UA to check for degree of glucosuria -We will need prostate exam at some point if he agrees as well as PSA if prostate is enlarged. -Continue management of diabetes as above

## 2022-03-09 NOTE — Progress Notes (Signed)
Internal Medicine Clinic Attending ? ?Case discussed with Dr. Zinoviev  At the time of the visit.  We reviewed the resident?s history and exam and pertinent patient test results.  I agree with the assessment, diagnosis, and plan of care documented in the resident?s note.  ?

## 2022-03-09 NOTE — Assessment & Plan Note (Addendum)
Patient presents for follow up. Most recent ECHO (TEE) from 05/2021 showed normal LV function, normal EF, no structural abnormalities.  Today, he denies SOB, has trace LEE. Weight down trending while on Mounjaro. No signs of volume overload on exam today. No need for diuresis at this time.

## 2022-03-10 ENCOUNTER — Emergency Department (HOSPITAL_COMMUNITY)
Admission: EM | Admit: 2022-03-10 | Discharge: 2022-03-10 | Disposition: A | Discharge status: Home / Self Care | Payer: 59 | Attending: Emergency Medicine | Admitting: Emergency Medicine

## 2022-03-10 ENCOUNTER — Other Ambulatory Visit: Payer: Self-pay

## 2022-03-10 ENCOUNTER — Telehealth: Payer: Self-pay | Admitting: Internal Medicine

## 2022-03-10 DIAGNOSIS — Z7901 Long term (current) use of anticoagulants: Secondary | ICD-10-CM | POA: Insufficient documentation

## 2022-03-10 DIAGNOSIS — Z79899 Other long term (current) drug therapy: Secondary | ICD-10-CM | POA: Diagnosis not present

## 2022-03-10 DIAGNOSIS — Z91010 Allergy to peanuts: Secondary | ICD-10-CM | POA: Insufficient documentation

## 2022-03-10 DIAGNOSIS — Z7984 Long term (current) use of oral hypoglycemic drugs: Secondary | ICD-10-CM | POA: Diagnosis not present

## 2022-03-10 DIAGNOSIS — Z7982 Long term (current) use of aspirin: Secondary | ICD-10-CM | POA: Diagnosis not present

## 2022-03-10 DIAGNOSIS — E875 Hyperkalemia: Secondary | ICD-10-CM | POA: Insufficient documentation

## 2022-03-10 DIAGNOSIS — Z794 Long term (current) use of insulin: Secondary | ICD-10-CM | POA: Insufficient documentation

## 2022-03-10 DIAGNOSIS — I509 Heart failure, unspecified: Secondary | ICD-10-CM | POA: Insufficient documentation

## 2022-03-10 DIAGNOSIS — N189 Chronic kidney disease, unspecified: Secondary | ICD-10-CM | POA: Diagnosis not present

## 2022-03-10 DIAGNOSIS — I4891 Unspecified atrial fibrillation: Secondary | ICD-10-CM | POA: Insufficient documentation

## 2022-03-10 DIAGNOSIS — I130 Hypertensive heart and chronic kidney disease with heart failure and stage 1 through stage 4 chronic kidney disease, or unspecified chronic kidney disease: Secondary | ICD-10-CM | POA: Insufficient documentation

## 2022-03-10 DIAGNOSIS — E1122 Type 2 diabetes mellitus with diabetic chronic kidney disease: Secondary | ICD-10-CM | POA: Diagnosis not present

## 2022-03-10 LAB — URINALYSIS, ROUTINE W REFLEX MICROSCOPIC
Bilirubin, UA: NEGATIVE
Ketones, UA: NEGATIVE
Leukocytes,UA: NEGATIVE
Nitrite, UA: NEGATIVE
RBC, UA: NEGATIVE
Specific Gravity, UA: 1.026 (ref 1.005–1.030)
Urobilinogen, Ur: 0.2 mg/dL (ref 0.2–1.0)
pH, UA: 5 (ref 5.0–7.5)

## 2022-03-10 LAB — COMPREHENSIVE METABOLIC PANEL
ALT: 35 U/L (ref 0–44)
AST: 26 U/L (ref 15–41)
Albumin: 3.8 g/dL (ref 3.5–5.0)
Alkaline Phosphatase: 76 U/L (ref 38–126)
Anion gap: 8 (ref 5–15)
BUN: 41 mg/dL — ABNORMAL HIGH (ref 6–20)
CO2: 17 mmol/L — ABNORMAL LOW (ref 22–32)
Calcium: 8.5 mg/dL — ABNORMAL LOW (ref 8.9–10.3)
Chloride: 108 mmol/L (ref 98–111)
Creatinine, Ser: 1.95 mg/dL — ABNORMAL HIGH (ref 0.61–1.24)
GFR, Estimated: 41 mL/min — ABNORMAL LOW (ref >60)
Glucose, Bld: 228 mg/dL — ABNORMAL HIGH (ref 70–99)
Potassium: 5.5 mmol/L — ABNORMAL HIGH (ref 3.5–5.1)
Sodium: 133 mmol/L — ABNORMAL LOW (ref 135–145)
Total Bilirubin: 0.4 mg/dL (ref 0.3–1.2)
Total Protein: 7.4 g/dL (ref 6.5–8.1)

## 2022-03-10 LAB — BMP8+ANION GAP
Anion Gap: 15 mmol/L (ref 10.0–18.0)
BUN/Creatinine Ratio: 25 — ABNORMAL HIGH (ref 9–20)
BUN: 41 mg/dL — ABNORMAL HIGH (ref 6–24)
CO2: 18 mmol/L — ABNORMAL LOW (ref 20–29)
Calcium: 9.3 mg/dL (ref 8.7–10.2)
Chloride: 107 mmol/L — ABNORMAL HIGH (ref 96–106)
Creatinine, Ser: 1.64 mg/dL — ABNORMAL HIGH (ref 0.76–1.27)
Glucose: 124 mg/dL — ABNORMAL HIGH (ref 70–99)
Potassium: 6.5 mmol/L — ABNORMAL HIGH (ref 3.5–5.2)
Sodium: 140 mmol/L (ref 134–144)
eGFR: 50 mL/min/{1.73_m2} — ABNORMAL LOW (ref >59)

## 2022-03-10 LAB — CBG MONITORING, ED
Glucose-Capillary: 96 mg/dL (ref 70–99)
Glucose-Capillary: 114 mg/dL — ABNORMAL HIGH (ref 70–99)

## 2022-03-10 LAB — MICROSCOPIC EXAMINATION: Casts: NONE SEEN /lpf

## 2022-03-10 LAB — CBC
HCT: 40.7 % (ref 39.0–52.0)
Hemoglobin: 13.2 g/dL (ref 13.0–17.0)
MCH: 27.8 pg (ref 26.0–34.0)
MCHC: 32.4 g/dL (ref 30.0–36.0)
MCV: 85.9 fL (ref 80.0–100.0)
Platelets: 313 10*3/uL (ref 150–400)
RBC: 4.74 MIL/uL (ref 4.22–5.81)
RDW: 13.7 % (ref 11.5–15.5)
WBC: 7.7 10*3/uL (ref 4.0–10.5)
nRBC: 0 % (ref 0.0–0.2)

## 2022-03-10 LAB — POTASSIUM: Potassium: 5.7 mmol/L — ABNORMAL HIGH (ref 3.5–5.1)

## 2022-03-10 MED ORDER — INSULIN ASPART 100 UNIT/ML IV SOLN
5.0000 [IU] | Freq: Once | INTRAVENOUS | Status: AC
  Administered 2022-03-10: 5 [IU] via INTRAVENOUS

## 2022-03-10 MED ORDER — DEXTROSE 50 % IV SOLN
1.0000 | Freq: Once | INTRAVENOUS | Status: AC
  Administered 2022-03-10: 50 mL via INTRAVENOUS
  Filled 2022-03-10: qty 50

## 2022-03-10 MED ORDER — SODIUM ZIRCONIUM CYCLOSILICATE 10 G PO PACK
10.0000 g | PACK | Freq: Once | ORAL | Status: AC
  Administered 2022-03-10: 10 g via ORAL
  Filled 2022-03-10: qty 1

## 2022-03-10 NOTE — ED Provider Notes (Signed)
[facility] EMERGENCY DEPARTMENT Provider Note   CSN: [id number] Arrival date & time: 03/10/22  1346     History  Chief Complaint  Patient presents with   Abnormal Lab    [name]. is a 51 y.o. male.  Patient with history of chronic kidney disease, diabetes, hypertension, heart failure tenderness over, history of stroke --presents to the emergency department today after having elevated potassium noted on blood draw at PCP office yesterday.  Potassium was 6.5.  Patient was encouraged to come to the emergency department for this reason.  [name] denies fever, chest pain, shortness of breath.  [name] has had diarrhea without vomiting.  Reports a few days of watery stool, twice a day.  No urinary symptoms.  No history of hemodialysis.  No treatments prior to arrival.      Home Medications Prior to Admission medications   Medication Sig Start Date End Date Taking? Authorizing Provider  metoprolol succinate (TOPROL-XL) 100 MG 24 hr tablet TAKE 1 TABLET BY MOUTH DAILY. TAKE WITH OR IMMEDIATELY FOLLOWING A MEAL. 03/02/22   [name], MD  Accu-Chek Softclix Lancets lancets TEST UP TO 4 TIMES A DAY 03/09/22   Wayland Denis, MD  amLODipine (NORVASC) 10 MG tablet Take 1 tablet (10 mg total) by mouth daily. 07/14/21 07/14/22  Maudie Mercury, MD  amphetamine-dextroamphetamine (ADDERALL XR) 30 MG 24 hr capsule Take 1 capsule (30 mg total) by mouth daily as needed (focus). 1 daily as needed 03/01/22   Baird Lyons D, MD  aspirin 81 MG EC tablet Take 1 tablet (81 mg total) by mouth daily. Swallow whole. 08/04/21   Lacinda Axon, MD  atorvastatin (LIPITOR) 80 MG tablet Take 1 tablet (80 mg total) by mouth daily. 04/27/21   [name], MD  blood glucose meter kit and supplies KIT Dispense based on patient and insurance preference. Use up to four times daily as directed. 12/29/20   [name], MD  Blood Pressure Monitoring (BLOOD PRESSURE MONITOR/L CUFF)  MISC 1 Units by Does not apply route daily. 03/19/19   Tacy Learn, PA-C  cetirizine (ZYRTEC ALLERGY) 10 MG tablet Take 1 tablet (10 mg total) by mouth daily. 01/06/22 01/06/23  [name], MD  ELIQUIS 5 MG TABS tablet TAKE 1 TABLET BY MOUTH TWICE A DAY 02/01/22   Farrel Gordon, DO  empagliflozin (JARDIANCE) 25 MG TABS tablet Take 1 tablet (25 mg total) by mouth daily. 01/06/22   [name], MD  flecainide (TAMBOCOR) 100 MG tablet TAKE 1 TABLET BY MOUTH TWICE A DAY 10/12/21   Allred, Jeneen Rinks, MD  fluticasone (FLONASE) 50 MCG/ACT nasal spray Place 1 spray into both nostrils daily. 01/06/22 01/06/23  [name], MD  glucose blood (ACCU-CHEK GUIDE) test strip Please use strips to test morning fasting sugars and one hour after each meal, total 4x daily 03/10/22   Wayland Denis, MD  metFORMIN (GLUCOPHAGE) 1000 MG tablet Take 1 tablet (1,000 mg total) by mouth 2 (two) times daily with a meal. 09/22/21 05/20/22  [name], MD  nicotine (NICODERM CQ - DOSED IN MG/24 HOURS) 21 mg/24hr patch PLACE 1 PATCH (21 MG TOTAL) ONTO THE SKIN DAILY. 12/20/20   Gaylan Gerold, DO  nicotine (NICODERM CQ) 21 mg/24hr patch Place 1 patch (21 mg total) onto the skin daily. 06/02/21   Deneise Lever, MD  olmesartan (BENICAR) 40 MG tablet Take 1 tablet (40 mg total) by mouth daily. 03/09/22   Wayland Denis, MD  pantoprazole (PROTONIX) 40 MG  tablet Take 1 tablet (40 mg total) by mouth daily. 08/26/21   [name], MD  polyethylene glycol (MIRALAX) 17 g packet Take 17 g by mouth daily. 08/17/21   [name], MD  tirzepatide [facility]) 5 MG/0.5ML Pen Inject 5 mg into the skin once a week for 28 days. 03/09/22 04/06/22  Wayland Denis, MD      Allergies    Shrimp [shellfish allergy], Banana, Watermelon flavor, Almond oil, Other, Peanut-containing drug products, and Sulfa antibiotics    Review of Systems   Review of Systems  Physical Exam Updated Vital Signs BP 111/71   Pulse 72   Temp 98.5 F (36.9  C) (Oral)   Resp 18   SpO2 92%   Physical Exam Vitals and nursing note reviewed.  Constitutional:      General: [name] is not in acute distress.    Appearance: [name] is well-developed.  HENT:     Head: Normocephalic and atraumatic.  Eyes:     General:        Right eye: No discharge.        Left eye: No discharge.     Conjunctiva/sclera: Conjunctivae normal.  Cardiovascular:     Rate and Rhythm: Normal rate and regular rhythm.     Heart sounds: Normal heart sounds.  Pulmonary:     Effort: Pulmonary effort is normal.     Breath sounds: Normal breath sounds.  Abdominal:     Palpations: Abdomen is soft.     Tenderness: There is no abdominal tenderness.  Musculoskeletal:     Cervical back: Normal range of motion and neck supple.  Skin:    General: Skin is warm and dry.  Neurological:     Mental Status: [name] is alert.    ED Results / Procedures / Treatments   Labs (all labs ordered are listed, but only abnormal results are displayed) Labs Reviewed  COMPREHENSIVE METABOLIC PANEL - Abnormal; Notable for the following components:      Result Value   Sodium 133 (*)    Potassium 5.5 (*)    CO2 17 (*)    Glucose, Bld 228 (*)    BUN 41 (*)    Creatinine, Ser 1.95 (*)    Calcium 8.5 (*)    GFR, Estimated 41 (*)    All other components within normal limits  POTASSIUM - Abnormal; Notable for the following components:   Potassium 5.7 (*)    All other components within normal limits  CBC    EKG EKG Interpretation  Date/Time:  Friday Mar 10 2022 14:05:52 EDT Ventricular Rate:  75 PR Interval:  204 QRS Duration: 100 QT Interval:  392 QTC Calculation: 437 R Axis:   -55 Text Interpretation: Normal sinus rhythm Left anterior fascicular block Septal infarct , age undetermined Abnormal ECG No significant change since last tracing Confirmed by Deno Etienne [phone]) on 03/10/2022 2:54:43 PM  Radiology No results found.  Procedures Procedures    Medications Ordered in ED Medications  - No data to display  ED Course/ Medical Decision Making/ A&P    Patient seen and examined. History obtained directly from patient.  Also, reviewed clinic notes and labs from yesterday.  Labs/EKG: Personally reviewed and interpreted lab work drawn in triage.  This includes CBC unremarkable, CMP potassium 5.5 with slight hemolysis, creatinine 1.95 and BUN 41 slightly elevated from baseline, glucose 228, anion gap 8.  EKG reviewed, agree with interpretation as above.  No prolonged QRS or peaked T waves.  Imaging: None  ordered  Medications/Fluids: None ordered.  Most recent vital signs reviewed and are as follows: BP 111/71   Pulse 72   Temp 98.5 F (36.9 C) (Oral)   Resp 18   SpO2 92%   Initial impression: patient with chronic kidney disease, questionable hyperkalemia.  First potassium here was hemolyzed, but improved from a value drawn yesterday.   Patient discussed with Dr. Gilford Raid.   4:19 PM second potassium returned at 5.7 without evidence of hemolysis.  We will give oral Lokelma and 5 units of insulin with D50.  This should help temporize potassium.  Anticipate discharge if patient continues to do well.  4:35 PM Pt updated. RN at bedside giving medications.   8:07 PM Reassessment performed. Patient appears well.  Glucose did drop to 96 and on recheck prior to discharge, was 114.  Patient states that [name] feels well, comfortable with discharge to home.  Reviewed pertinent lab work and imaging with patient at bedside. Questions answered.   Most current vital signs reviewed and are as follows: BP 108/68 (BP Location: Right Arm)   Pulse 72   Temp 98.2 F (36.8 C) (Oral)   Resp 16   SpO2 97%   Plan: Discharge to home.   Prescriptions written for: None  ED return instructions discussed: Return with worsening weakness, vomiting, severe diarrhea, other concerns.  Follow-up instructions discussed: Patient encouraged to follow-up with their PCP in 3 days for recheck of  potassium.                          Medical Decision Making Amount and/or Complexity of Data Reviewed Labs: ordered.  Risk OTC drugs. Prescription drug management.   Patient presents for hyperkalemia.  In the ED here, was 5.7 without EKG changes.  At PCP office yesterday it was 6.5.  Patient was treated with Lokelma and IV insulin and sugar.  [name] has done well with this.  [name] is asymptomatic.  Feel that patient can be discharged home at this time.  [name] agrees to follow-up with PCP next week referred for recheck.  Will send message to provider.  The patient's vital signs, pertinent lab work and imaging were reviewed and interpreted as discussed in the ED course. Hospitalization was considered for further testing, treatments, or serial exams/observation. However as patient is well-appearing, has a stable exam, and reassuring studies today, I do not feel that they warrant admission at this time. This plan was discussed with the patient who verbalizes agreement and comfort with this plan and seems reliable and able to return to the Emergency Department with worsening or changing symptoms.            Final Clinical Impression(s) / ED Diagnoses Final diagnoses:  Hyperkalemia    Rx / DC Orders ED Discharge Orders     None         Carlisle Cater, Hershal Coria 03/10/22 2253    Isla Pence, MD 03/10/22 2319

## 2022-03-10 NOTE — ED Notes (Signed)
All discharge instructions reviewed with patient and patient verbalized understanding of same. Patient stable and ambulatory at time of discharge.  ?

## 2022-03-10 NOTE — ED Notes (Signed)
Patient provided with something to eat and drink at this time.  ?

## 2022-03-10 NOTE — Discharge Instructions (Addendum)
Please read and follow all provided instructions.  Your diagnoses today include:  1. Hyperkalemia    Tests performed today include: Complete blood cell count: No problems Basic metabolic panel: Slightly weak kidney function today compared to normal, potassium that was a bit high Vital signs. See below for your results today.   Medications prescribed:  None  Take any prescribed medications only as directed.  Home care instructions:  Follow any educational materials contained in this packet.  BE VERY CAREFUL not to take multiple medicines containing Tylenol (also called acetaminophen). Doing so can lead to an overdose which can damage your liver and cause liver failure and possibly death.   Follow-up instructions: Please follow-up with your primary care provider in the next 3 days for further evaluation of your symptoms.  You will need to have your potassium level rechecked next week.  Return instructions:  Please return to the Emergency Department if you experience worsening symptoms.  Please return if you have any other emergent concerns.  Additional Information:  Your vital signs today were: BP 111/79   Pulse 66   Temp 98.5 F (36.9 C) (Oral)   Resp 17   SpO2 95%  If your blood pressure (BP) was elevated above 135/85 this visit, please have this repeated by your doctor within one month. --------------

## 2022-03-10 NOTE — Telephone Encounter (Signed)
Patient was seen for follow up yesterday and BMP was obtained. BMP with critical high potassium of 6.5. Patient was contacted and informed of critically high potassium value and directed to go to the emergency department immediately. Patient was counseled that high potassium could put him at risk for arrhythmias which could result in death. Patient voiced understanding, stated he will leave now to go to the ED.   [facility] ED charge nurse, Judson Roch, was contacted and informed of patient's situation in order to expedite his triage.  Wayland Denis, MD 03/10/22,  12:40 PM Pager: [phone] Internal Medicine Resident, PGY-1 [facility]

## 2022-03-10 NOTE — ED Triage Notes (Signed)
Pt. Stated, I went to Internal Medicine and drew my blood and my Potassium was really high. Denies any symptoms

## 2022-03-10 NOTE — ED Provider Triage Note (Signed)
Emergency Medicine Provider Triage Evaluation Note  [name]. , a 51 y.o. male  was evaluated in triage.  Pt complains of being sent by primary care doctor for hyperkalemia, potassium 6.5 on lab work from yesterday.  Patient denies any chest pain, heart racing sensation.  He reports that he has some occasional nausea without abdominal pain.  He has had some diarrhea no constipation.  He denies any fever, chills, shortness of breath.  He reports he has had a history of diabetes, strokes, is currently taking Eliquis, has some kidney problems but does not currently have dialysis.  Hx of irregular heartbeat and previous ablation.  Previous history of alcohol abuse, cocaine abuse, pancreatitis.  Review of Systems  Positive: Nausea, diarrhea Negative: Chest pain, shortness of breath, heart racing, confusion, syncope  Physical Exam  BP 102/67 (BP Location: Right Arm)   Pulse 74   Temp 98.5 F (36.9 C) (Oral)   Resp 20   SpO2 98%  Gen:   Awake, no distress   Resp:  Normal effort  MSK:   Moves extremities without difficulty  Other:    Medical Decision Making  Medically screening exam initiated at 2:11 PM.  Appropriate orders placed.  [name]. was informed that the remainder of the evaluation will be completed by another provider, this initial triage assessment does not replace that evaluation, and the importance of remaining in the ED until their evaluation is complete.  Workup initiated   [name], [place] 03/10/22 1413

## 2022-03-14 ENCOUNTER — Encounter: Payer: Self-pay | Admitting: Adult Health

## 2022-03-14 ENCOUNTER — Ambulatory Visit: Payer: 59 | Admitting: Adult Health

## 2022-03-14 VITALS — BP 130/81 | HR 73 | Ht 69.0 in | Wt 306.4 lb

## 2022-03-14 DIAGNOSIS — I639 Cerebral infarction, unspecified: Secondary | ICD-10-CM | POA: Diagnosis not present

## 2022-03-14 NOTE — Progress Notes (Signed)
[facility] [address] [place]. [name] [address] [phone]       STROKE FOLLOW UP NOTE  [name]. Date of Birth:  1971/09/19 Medical Record Number:  [id number]   Reason for Referral: stroke follow up    SUBJECTIVE:   CHIEF COMPLAINT:  Chief Complaint  Patient presents with   Recurrent strokes    Rm 3, 6 month FU "saw PCP who did labs, called me next day and told me to go to ED because my Potassium was high, ED said it was not dangerously high"       HPI:   Update 03/14/2022 [name]: Patient returns for 12-monthstroke follow-up.  Overall stable without new stroke/TIA symptoms.  Able to maintain ADLs and IADLs independently.  He does not drive.  Does have occasional imbalance/dizziness and mild right hand weakness since his prior strokes.  Remains on aspirin, Eliquis and atorvastatin, denies side effects.  Blood pressure today 130/81.  Most recent A1c 10.2 with adjustments made to diabetic regimen by PCP.  Routinely monitors glucose levels at home and typically 100-140s.  Continued tobacco use, denies any other substance abuse.  Continues to follow with pulmonology for sleep apnea.  He was evaluated in the ED 5/26 for hyperkalemia.  No further concerns at this time.      History provided for reference purposes only Update 09/12/2021 [name]: Returns for stroke follow-up after prior visit approximately 4 months ago.    Stable from stroke standpoint without new stroke/TIA symptoms. Chronic right hemiparesis stable  Reports resolution of prior swallowing difficulties - MBS 04/2021 unremarkable He does question ability to return back to driving  Blood pressure today 138/86 - does not routinely monitor at home Prior A1c 9.0 up from 8.2 - recent medication adjustments - managed by PCP Difficulty using CPAP as he feels like he is suffocating but does try to be compliant.  Followed by Dr. YAnnamaria BootsContinued tobacco use approx 0.5 PPD Denies any  additional drug use or EtOH use  No new concerns at this time   Update 04/19/2021, [name] for scheduled stroke follow-up visit but unfortunately had recent stroke on 04/14/2021.  He presented with worsening slurred speech and swallowing difficulty with MRI showing small right pontine, left frontal white matter and left temporal white matter infarcts.  CTA head/neck unremarkable.  LDL 49, TG 210.  A1c 8.2.  UDS positive for cocaine.  2D echo showed EF of 60 to 65%.  Etiology possibly synchronized small vessel disease given locations and uncontrolled risk factors (HTN, DM, tobacco use, OSA and cocaine) although cardioembolic etiology cannot be ruled out given vessel territory.  Recommended continued use of Eliquis as well as adding aspirin 81 mg daily and continuation of atorvastatin 80 mg daily.  Recommended TEE to rule out large PFO or cardiac tumor in setting of recurrent strokes on Eliquis however recommended outpatient due to holiday weekend.  LE venous Doppler negative.  Hypercoagulable labs pending at discharge.  Evaluated by therapies who recommended outpatient PT for decreased mobility and imbalance.   Since discharge, he continues to experience greater swallowing difficulty with thin liquids.  Denies any difficulty with solid foods.  He believes speech has returned back to baseline.  Right-sided weakness and gait stable.  Bedside SLP swallow eval during hospitalization cleared for regular diet and thin liquids as swallowing within functional limits.  He has not yet started PT and questions why he needs this.  He was previously working with neuro's  which were completed in April.  He continues to live with his sister who assists with IADLs but is able to maintain ADLs independently. Denies new or worsening stroke/TIA symptoms since discharge  Reports compliance on Eliquis, aspirin and atorvastatin without associated side effects Blood pressure today 170/90 - does not routinely monitor at  home  Glucose levels routinely monitored at home and typically greater than 200 (per pt report) -reports compliance on diabetic regimen Routinely follows with pulmonology Dr. Annamaria Walsh for sleep apnea and narcolepsy -recent OV ntoe personally reviewed with reported noncompliance for CPAP treatment and plans on repeating split-night study to establish control of OSA - he has not yet completed but does report since recent stroke, he has restarted his CPAP He has tried to smoke cigarettes but reports having difficulty inhaling since recent stroke -previously smoking 1 pack/day.  Currently using nicotine patch. He denies any additional cocaine use and reports one-time use the week of his stroke - he does not plan on using again in the future    Update 12/14/2020 [name]: [name] returns for stroke follow-up unaccompanied.  He was initially scheduled 11/18/2020 for routine follow-up but left prior to being seen as he was late for appointment.  He was seen by his PCP that day with complaints of 2-day onset of worsening right-sided weakness and dysarthria.  Initially planned on doing further work-up outpatient as symptoms present >48 hours but lab work showed hyperkalemia therefore PCP advised pt to proceed to ED for further management on 11/19/2020.  MRI showed acute infarction in the left pons, right lateral splenium of the corpus callosum and medial left temporal lobe/hippocampus felt likely microembolic with history of A. fib despite compliance on Xarelto.  MRA head/neck unremarkable.  Due to concern of Xarelto failure, switched to Eliquis 5 mg twice daily.  Aspirin 81 mg daily also initiated.  Potassium levels stabilized with medication adjustments and interventions and advised outpatient follow-up with PCP.  Since discharge, reports continued right sided weakness, dysarthria, gait impairment with imbalance, cognitive impairment, and possible dysphagia.  SLP reached out prior to todays visit with pt reporting concern of  occasional coughing with water - no difficulty with food but typically difficulty with water - SLP [name] requested MBS for further evaluation Currently working with neuro rehab PT/OT/SLP He is currently awaiting for rolling walker to help him ambulate longer distances -currently ambulating without assistive device but denies any recent falls Denies new or worsening stroke/TIA symptoms  Reports compliance on Eliquis 5 mg twice daily and aspirin 81 mg daily-denies side effects Reports compliance on atorvastatin 80 mg daily -denies side effects Blood pressure today 160/96 - does not routinely monitor at home but has been elevated during therapy visits Glucose levels not currently monitored at home as he recently ran out of lancets and plans on contacting PCP for refill  He continues to follow with pulmonology Dr. Annamaria Walsh for history of narcolepsy with cataplexy and sleep apnea but plans on pursuing CPAP/BiPAP titration due to difficulty tolerating CPAP.  Titration study scheduled 01/16/2021.   Lab work 11/19/2020 A1c 7.7 (down from 9.9) LDL 102 -  Increased atorvastatin from 40 mg to 80 mg daily  Continued tobacco use approx 1 pack/week with eventual goal of complete cessation  No further concerns at this time   Initial visit 08/09/2020 [name]: [name] is being seen for hospital follow-up unaccompanied.  Reports residual mild to moderate speech difficulty, short-term memory impairment, imbalance and visual impairment. Denies residual right sided weakness.  He has not worked with any therapy but PCP placed referral to initiate physical and speech therapy but he has not scheduled visits at this time.  Reports worsening vision post stroke with underlying history of diabetic retinopathy.  Denies new or worsening stroke/TIA symptoms.  He reports ongoing compliance with Xarelto and denies bleeding or bruising.  Reports compliance with atorvastatin and denies myalgias.  Blood pressure today satisfactory at 127/77.   He does not routinely monitor at home.  Glucose level stable per patient.  He does report excessive daytime fatigue but has been noncompliant with CPAP over the past couple months due to increased anxiety and "fear of choking".  He has not further discuss this with his pulmonologist.  Prescribed Adderall for history of narcolepsy but he has not taken since July as he has not been working although per epic reviewed, pulmonologist recently refilled Adderall prescription.  No further concerns at this time.   Stroke admission 06/01/2020 [name] is a 51 y.o. male with history of atrial fibrillation, OSA on CPAP, narcolepsy, diabetes, hypertension, morbid obesity, GERD, history of polysubstance abuse including cocaine and alcohol  who presented on 06/01/2020 with persistent dysarthric speech.  Stroke work-up revealed small L caudate body infarct embolic secondary to known AF source not consistently taking Xarelto.  Recommended continuation of Xarelto consistently for secondary stroke prevention.  HTN stable.  LDL 103 and increase atorvastatin to 40 mg daily.  Uncontrolled DM with A1c 9.9.  Other stroke risk factors include tobacco use, hx ETOH and substance abuse, obesity, family history of stroke and OSA on CPAP.  No prior personal history of stroke.  Evaluated by therapy without PT/OT needs but recommended outpatient SLP for residual cognitive impairment and mild to moderate dysarthria  Stroke:   Small L caudate body infarct embolic secondary to known AF source not consistently taking Xarelto CT head focal hyperdensities B corona radiata ? Acute infarct, prominent Small vessel disease and Atrophy.  MRI  Small L caudate body infarct. Small vessel disease. Atrophy.  MR CS Unremarkable  MRA head Unremarkable  MRA neck Unremarkable  2D Echo EF 60-65%. No source of embolus. LA mildly dilated LDL 103 HgbA1c 9.9 VTE prophylaxis - SCDs   Xarelto (rivaroxaban) daily prior to admission, now on  aspirin 325 mg daily. Not taking xarelto as prescribed - resume xarelto and stop aspirin  Therapy recommendations:  HH SLP Disposition:  home      ROS:   14 system review of systems performed and negative with exception of those listed in HPI  PMH:  Past Medical History:  Diagnosis Date   Acute pancreatitis 01/22/2019   Diabetes mellitus    GERD (gastroesophageal reflux disease)    History of alcohol abuse    History of cocaine use    History of echocardiogram    a. Echo 4/14: Moderate LVH, vigorous LVEF, EF 65-70%, normal wall motion, grade 2 diastolic dysfunction, mildly dilated aortic root and ascending aorta, ascending aorta 40 mm, aortic root 38 mm, mild LAE   Hx of cardiovascular stress test    a. GXT 5/14: No ischemic changes  //  b. ETT-Myoview 3/16:  Low risk, no ischemia, EF 58%   Hyperlipidemia    Hypertension    Morbid obesity ([place])    Paroxysmal atrial fibrillation ([place])    Occurring in 2008, with several recurrence since then (including in the setting of + cocaine on UDS).   Sleep apnea    uses CPAP   Stroke (  [place])    x3   SVT (supraventricular tachycardia) ([place])    mid to long RP SVT 09/2019    PSH:  Past Surgical History:  Procedure Laterality Date   APPENDECTOMY     ATRIAL FIBRILLATION ABLATION N/A 11/27/2019   Procedure: ATRIAL FIBRILLATION ABLATION;  Surgeon: Thompson Grayer, MD;  Location: [facility];  Service: Cardiovascular;  Laterality: N/A;   BUBBLE STUDY  05/20/2021   Procedure: BUBBLE STUDY;  Surgeon: Donato Heinz, MD;  Location: [facility];  Service: Cardiovascular;;   ROTATOR CUFF REPAIR     SVT ABLATION N/A 11/27/2019   Procedure: SVT ABLATION;  Surgeon: Thompson Grayer, MD;  Location: [facility];  Service: Cardiovascular;  Laterality: N/A;   TEE WITHOUT CARDIOVERSION N/A 05/20/2021   Procedure: TRANSESOPHAGEAL ECHOCARDIOGRAM (TEE);  Surgeon: Donato Heinz, MD;  Location: [facility];  Service: Cardiovascular;   Laterality: N/A;   TONSILLECTOMY      Social History:  Social History   Socioeconomic History   Marital status: Single    Spouse name: Not on file   Number of children: 0   Years of education: 12   Highest education level: Not on file  Occupational History   Occupation: Unemployed    Employer: UNEMPLOYED  Tobacco Use   Smoking status: Every Day    Packs/day: 0.50    Years: 28.00    Pack years: 14.00    Types: Cigarettes   Smokeless tobacco: Former    Types: Snuff   Tobacco comments:    1 pack per week [facility] 12/09/2021  Vaping Use   Vaping Use: Never used  Substance and Sexual Activity   Alcohol use: Yes    Alcohol/week: 6.0 standard drinks    Types: 6 Standard drinks or equivalent per week    Comment: rarely   Drug use: Not Currently    Comment: cocaine in the past, none currently   Sexual activity: Not on file  Other Topics Concern   Not on file  Social History Narrative   Fun: Designer, fashion/clothing       Lives in [place] with sister.      Unemployed, was working a Land job   Social Determinants of Sales executive: Not on file  Food Insecurity: Not on file  Transportation Needs: Not on file  Physical Activity: Not on file  Stress: Not on file  Social Connections: Not on file  Intimate Partner Violence: Not on file    Family History:  Family History  Problem Relation Age of Onset   Diabetes Mother    Heart attack Father 22   Stroke Maternal Grandmother    Stroke Paternal Grandmother    Diabetes Paternal Grandfather     Medications:   Current Outpatient Medications on File Prior to Visit  Medication Sig Dispense Refill   Accu-Chek Softclix Lancets lancets TEST UP TO 4 TIMES A DAY 100 each 2   amLODipine (NORVASC) 10 MG tablet Take 1 tablet (10 mg total) by mouth daily. 30 tablet 11   amphetamine-dextroamphetamine (ADDERALL XR) 30 MG 24 hr capsule Take 1 capsule (30 mg total) by mouth daily as needed (focus). 1 daily as needed 30  capsule 0   aspirin 81 MG EC tablet Take 1 tablet (81 mg total) by mouth daily. Swallow whole. 90 tablet 1   atorvastatin (LIPITOR) 80 MG tablet Take 1 tablet (80 mg total) by mouth daily. 90 tablet 3   blood glucose meter kit and  supplies KIT Dispense based on patient and insurance preference. Use up to four times daily as directed. 1 each 0   Blood Pressure Monitoring (BLOOD PRESSURE MONITOR/L CUFF) MISC 1 Units by Does not apply route daily. 1 each 0   cetirizine (ZYRTEC ALLERGY) 10 MG tablet Take 1 tablet (10 mg total) by mouth daily. 30 tablet 2   ELIQUIS 5 MG TABS tablet TAKE 1 TABLET BY MOUTH TWICE A DAY 60 tablet 5   empagliflozin (JARDIANCE) 25 MG TABS tablet Take 1 tablet (25 mg total) by mouth daily. 90 tablet 2   flecainide (TAMBOCOR) 100 MG tablet TAKE 1 TABLET BY MOUTH TWICE A DAY 60 tablet 7   fluticasone (FLONASE) 50 MCG/ACT nasal spray Place 1 spray into both nostrils daily. 15.8 mL 2   glucose blood (ACCU-CHEK GUIDE) test strip Please use strips to test morning fasting sugars and one hour after each meal, total 4x daily 200 strip 5   metFORMIN (GLUCOPHAGE) 1000 MG tablet Take 1 tablet (1,000 mg total) by mouth 2 (two) times daily with a meal. 120 tablet 3   metoprolol succinate (TOPROL-XL) 100 MG 24 hr tablet TAKE 1 TABLET BY MOUTH DAILY. TAKE WITH OR IMMEDIATELY FOLLOWING A MEAL. 90 tablet 3   nicotine (NICODERM CQ - DOSED IN MG/24 HOURS) 21 mg/24hr patch PLACE 1 PATCH (21 MG TOTAL) ONTO THE SKIN DAILY. 28 patch 1   olmesartan (BENICAR) 40 MG tablet Take 1 tablet (40 mg total) by mouth daily. 90 tablet 2   pantoprazole (PROTONIX) 40 MG tablet Take 1 tablet (40 mg total) by mouth daily. 90 tablet 3   polyethylene glycol (MIRALAX) 17 g packet Take 17 g by mouth daily. 14 each 0   tirzepatide (MOUNJARO) 5 MG/0.5ML Pen Inject 5 mg into the skin once a week for 28 days. 2 mL 0   No current facility-administered medications on file prior to visit.    Allergies:   Allergies   Allergen Reactions   Shrimp [Shellfish Allergy] Anaphylaxis   Banana Other (See Comments)    Pt gags   Watermelon Flavor Nausea And Vomiting   Almond Oil Itching    Roof of mouth itches   Other Itching    Grapes cause itching   Peanut-Containing Drug Products Itching and Cough   Sulfa Antibiotics Itching      OBJECTIVE:  Physical Exam  Vitals:   03/14/22 1035  BP: 130/81  Pulse: 73  Weight: (!) 306 lb 6.4 oz (139 kg)  Height: _0  (1.753 m)   Body mass index is 45.25 kg/m. No results found.   General: Morbidly obese pleasant middle-aged African-American male, seated, in no evident distress Head: head normocephalic and atraumatic.   Neck: supple with no carotid or supraclavicular bruits Cardiovascular: regular rate and rhythm, no murmurs Musculoskeletal: no deformity Skin:  no rash/petichiae Vascular:  Normal pulses all extremities   Neurologic Exam Mental Status: Awake and fully alert. Mild dysarthria with mild baseline stuttering.  Oriented to place and time. Recent memory impaired and remote memory intact. Attention span, concentration and fund of knowledge appropriate during visit. Mood and affect flat.  Cranial Nerves: Pupils equal, briskly reactive to light. Extraocular movements full without nystagmus. Visual fields full to confrontation. Hearing intact. Facial sensation intact. R lower facial weakness. Tongue and palate moves normally and symmetrically.  Motor: Normal bulk and tone. Normal strength in all tested extremity muscles except slight weakness right grip strength. Sensory.: intact to touch , pinprick , position and  vibratory sensation.  Coordination: Rapid alternating movements normal in all extremities except decreased right hand dexterity. Finger-to-nose and heel-to-shin performed accurately bilaterally. Gait and Station: Arises from chair without difficulty. Stance is normal. Gait demonstrates  shortened stride length and step height with mild  unsteadiness and imbalance without use of assistive device.  Tandem walk and heel toe not attempted. Reflexes: 1+ and symmetric. Toes downgoing.         ASSESSMENT: [name]. is a 51 y.o. year old male with history of left caudate head stroke likely in setting of known AF with Xarelto noncompliance 03/01/16, multiple embolic infarcts within L pons, left temporal lobe and left lateral splenium of corpus callosum 11/19/2020 likely in setting of known AF despite Xarelto compliance therefore switched to Eliquis and recent right pontine, left frontal WM and left temporal WM infarcts 04/14/2021 likely in setting of synchronized small vessel disease although cardioembolic source cannot be completely ruled out. Vascular risk factors include A. Fib, HTN, HLD, DM, OSA with CPAP intolerance, morbid obesity, current tobacco use, cocaine use and EtOH use.      PLAN:  Multiple recurrent strokes:  Residual deficit: Right hand weakness, dysarthria, and mild cognitive impairment - stable TEE unremarkable (05/20/2021) Hypercoagulable labs largely unremarkable with some slightly elevated likely clinically insignificant Continue aspirin 81 mg daily and Eliquis (apixaban) daily  and atorvastatin 80 mg daily for secondary stroke prevention Discussed secondary stroke prevention measures and importance of close PCP follow up for aggressive stroke risk factor management including BP goal<130/90, HLD with LDL goal<70 and DM with A1c.<7  Atrial fibrillation:  Continue Eliquis 5 mg twice daily for CHA2DS2-VASc score of at least 4 In setting of recent stroke, likely Xarelto failure therefore switched to Eliquis 5 mg twice daily which is managed by cardiology OSA, narcolepsy: Followed by Dr. Annamaria Walsh.  Discussed importance of continue nightly CPAP usage. Tobacco abuse: Discussed importance of complete tobacco cessation as continued use greatly increases risk of additional strokes and cardiovascular  disease Cocaine abuse: UDS positive 04/2021.  Reports complete cessation since that time    Doing well from stroke standpoint without further recommendations and risk factors are managed by PCP. He may follow up PRN, as usual for our patients who are strictly being followed for stroke. If any new neurological issues should arise, request PCP place referral for evaluation by one of our neurologists. Thank you.     CC:  [name], MD     I spent 34 minutes of face-to-face and non-face-to-face time with patient.  This included previsit chart review, lab review, study review, electronic health record documentation, and patient education and discussion regarding recurrent multiple strokes with residual deficits, secondary stroke prevention measures and aggressive stroke risk factor management as above and answered all other questions to patients satisfaction  Frann Rider, AGNP-BC  [facility] [address] [place], [place] [address]  Phone [phone] Fax [phone] Note: This document was prepared with digital dictation and possible smart phrase technology. Any transcriptional errors that result from this process are unintentional.

## 2022-03-14 NOTE — Patient Instructions (Addendum)
Continue aspirin 81 mg daily and Eliquis (apixaban) daily  and atorvastatin for secondary stroke prevention  Continue to follow up with PCP regarding cholesterol, blood pressure and diabetes management  Maintain strict control of hypertension with blood pressure goal below 130/90, diabetes with hemoglobin A1c goal below 7.0 % and cholesterol with LDL cholesterol (bad cholesterol) goal below 70 mg/dL.   Signs of a Stroke? Follow the BEFAST method:  Balance Watch for a sudden loss of balance, trouble with coordination or vertigo Eyes Is there a sudden loss of vision in one or both eyes? Or double vision?  Face: Ask the person to smile. Does one side of the face droop or is it numb?  Arms: Ask the person to raise both arms. Does one arm drift downward? Is there weakness or numbness of a leg? Speech: Ask the person to repeat a simple phrase. Does the speech sound slurred/strange? Is the person confused ? Time: If you observe any of these signs, call 911.     Overall stable from stroke standpoint, you can follow up as needed at this point       Thank you for coming to see [place] at [facility]. I hope we have been able to provide you high quality care today.  You may receive a patient satisfaction survey over the next few weeks. We would appreciate your feedback and comments so that we may continue to improve ourselves and the health of our patients.

## 2022-03-22 ENCOUNTER — Encounter: Payer: Self-pay | Admitting: Internal Medicine

## 2022-03-22 ENCOUNTER — Ambulatory Visit (INDEPENDENT_AMBULATORY_CARE_PROVIDER_SITE_OTHER): Payer: 59 | Admitting: Internal Medicine

## 2022-03-22 ENCOUNTER — Other Ambulatory Visit: Payer: Self-pay

## 2022-03-22 VITALS — BP 140/84 | HR 71 | Temp 97.9°F | Ht 69.0 in | Wt 302.2 lb

## 2022-03-22 DIAGNOSIS — N1831 Chronic kidney disease, stage 3a: Secondary | ICD-10-CM | POA: Diagnosis not present

## 2022-03-22 DIAGNOSIS — E875 Hyperkalemia: Secondary | ICD-10-CM | POA: Diagnosis not present

## 2022-03-22 DIAGNOSIS — E118 Type 2 diabetes mellitus with unspecified complications: Secondary | ICD-10-CM

## 2022-03-22 LAB — BASIC METABOLIC PANEL
Anion gap: 5 (ref 5–15)
BUN: 40 mg/dL — ABNORMAL HIGH (ref 6–20)
CO2: 21 mmol/L — ABNORMAL LOW (ref 22–32)
Calcium: 9.1 mg/dL (ref 8.9–10.3)
Chloride: 110 mmol/L (ref 98–111)
Creatinine, Ser: 1.75 mg/dL — ABNORMAL HIGH (ref 0.61–1.24)
GFR, Estimated: 47 mL/min — ABNORMAL LOW (ref >60)
Glucose, Bld: 114 mg/dL — ABNORMAL HIGH (ref 70–99)
Potassium: 5.6 mmol/L — ABNORMAL HIGH (ref 3.5–5.1)
Sodium: 136 mmol/L (ref 135–145)

## 2022-03-22 NOTE — Assessment & Plan Note (Addendum)
Patient here today for an ED follow-up.  He presented to the ED about a week ago after he was seen here by his PCP.  During his PCP visit, his potassium was noted to be 6.5, therefore he was instructed to go to the ED.  At that time, he was asymptomatic.  His potassium was checked and found to be mildly elevated to 5.7.  He was given Lokelma and insulin with D50 and instructed to follow-up with his PCP.  Today, he does not endorse any specific complaints or concerns.  He is completely asymptomatic.  Plan: We will check BMP today and contact patient with results.    Addendum: Potassium on recheck of 5.6.  Patient was contacted with these results.  We will give Lokelma 10 g (1 packet) and follow-up in 1 week for lab only visit.  Patient is amenable to this plan, however he states he is unable to get transportation today, therefore will come by the clinic tomorrow to pick up the sample of lokelma.     06/19: Pt able to get transportation to pick up sample of lokelma. Medication Samples have been provided to the patient.  Drug name: Lokelma       Strength: 10 g        Qty: 1  LOT: [id number]  Exp.Date: Apr 14, 2024  Dosing instructions: Per packet instructions  The patient has been instructed regarding the correct time, dose, and frequency of taking this medication, including desired effects and most common side effects.   Pt will follow-up in 1 week and will need BMP at that time.  Andrey Campanile, MD 12:57 PM 04/03/2022

## 2022-03-22 NOTE — Assessment & Plan Note (Addendum)
The patient has been on Mounjaro 5 mg weekly, Jardiance 25 mg daily, and metformin 1000 mg daily.  He states that he has been motivated to manage his diabetes since he was last seen here.  His sugars used to be in the 200s, now they have been in the low 100s, with the lowest value being at 97.  Plan: Patient is scheduled for follow-up with Dr. Austin Miles on June 26th.  At that time, continue to titrate up [facility] and will need A1c recheck.

## 2022-03-22 NOTE — Progress Notes (Signed)
   CC: HFU  HPI:  [name] is a 51 y.o. with past medical history as noted below who presents to the clinic today for a hospital follow-up. Please see problem-based list for further details, assessments, and plans.   Past Medical History:  Diagnosis Date   Acute pancreatitis 01/22/2019   Diabetes mellitus    GERD (gastroesophageal reflux disease)    History of alcohol abuse    History of cocaine use    History of echocardiogram    a. Echo 4/14: Moderate LVH, vigorous LVEF, EF 65-70%, normal wall motion, grade 2 diastolic dysfunction, mildly dilated aortic root and ascending aorta, ascending aorta 40 mm, aortic root 38 mm, mild LAE   Hx of cardiovascular stress test    a. GXT 5/14: No ischemic changes  //  b. ETT-Myoview 3/16:  Low risk, no ischemia, EF 58%   Hyperlipidemia    Hypertension    Morbid obesity ([place])    Paroxysmal atrial fibrillation ([place])    Occurring in 2008, with several recurrence since then (including in the setting of + cocaine on UDS).   Sleep apnea    uses CPAP   Stroke ([place])    x3   SVT (supraventricular tachycardia) ([place])    mid to long RP SVT 09/2019   Review of Systems: Negative aside from that listed in individualized problem based charting.   Physical Exam:  Vitals:   03/22/22 0903 03/22/22 0915  BP: (!) 143/81 140/84  Pulse: 73 71  Temp: 97.9 F (36.6 C)   TempSrc: Oral   SpO2: 100%   Weight: (!) 302 lb 3.2 oz (137.1 kg)   Height: 5\' 9"  (1.753 m)    General: NAD, nl appearance HE: Normocephalic, atraumatic, EOMI, Conjunctivae normal ENT: No congestion, no rhinorrhea, no exudate or erythema  Cardiovascular: Normal rate, regular rhythm. No murmurs, rubs, or gallops Pulmonary: Effort normal, breath sounds normal. No wheezes, rales, or rhonchi Abdominal: soft, nontender, bowel sounds present Musculoskeletal: no swelling, deformity, injury or tenderness in extremities Skin: Warm, dry, no bruising, erythema, or  rash Psychiatric/Behavioral: normal mood, normal behavior     Assessment & Plan:   See Encounters Tab for problem based charting.  Patient discussed with Dr.  Saverio Danker

## 2022-03-22 NOTE — Patient Instructions (Signed)
Thank you, [name] for allowing [place] to provide your care today. Today we discussed your potassium levels.  I will call you today with the results from your lab draw.  Continue your other medications as normal.     I have ordered the following labs for you:  Lab Orders         BMP w [place]ion [place] (STAT/Sunquest-performed on-site)      Referrals ordered today:   Referral Orders  No referral(s) requested today     I have ordered the following medication/changed the following medications:   Stop the following medications: There are no discontinued medications.   Start the following medications: No orders of the defined types were placed in this encounter.    Follow up: as scheduled with Dr. Allyson Walsh later this month   Should you have any questions or concerns please call the internal medicine clinic at [phone].

## 2022-03-23 NOTE — Progress Notes (Addendum)
Internal Medicine Clinic Attending  Case discussed with Dr. Lorin Glass  At the time of the visit.  We reviewed the resident's history and exam and pertinent patient test results.  I agree with the assessment, diagnosis, and plan of care documented in the resident's note. Improving Cr with stable K since ED visit, although K remains elevated from prior. It appears his ARB dose has been slowly decreased to olmesartan 10 mg daily to help with hyperK, although given comorbidities he would likely benefit from at least low-dose ARB. [name] may need chronic Lokelma in order to remain this agent. Will also discuss nephrology referral at next visit given CKD3b.

## 2022-03-23 NOTE — Addendum Note (Signed)
Addended by: Dickie La on: 03/23/2022 01:46 PM   Modules accepted: Level of Service

## 2022-03-24 ENCOUNTER — Telehealth: Payer: Self-pay | Admitting: Internal Medicine

## 2022-03-24 NOTE — Telephone Encounter (Signed)
Called the patient back about picking up lokelma. States that he has not been able to get a ride but will try to come here to pick up his sample on Monday.

## 2022-03-28 ENCOUNTER — Other Ambulatory Visit: Payer: Self-pay | Admitting: Internal Medicine

## 2022-03-28 ENCOUNTER — Emergency Department (HOSPITAL_COMMUNITY)
Admission: EM | Admit: 2022-03-28 | Discharge: 2022-03-28 | Disposition: A | Discharge status: Home / Self Care | Payer: 59 | Attending: Emergency Medicine | Admitting: Emergency Medicine

## 2022-03-28 ENCOUNTER — Emergency Department (HOSPITAL_COMMUNITY): Payer: 59

## 2022-03-28 ENCOUNTER — Encounter (HOSPITAL_COMMUNITY): Payer: Self-pay

## 2022-03-28 ENCOUNTER — Other Ambulatory Visit: Payer: Self-pay

## 2022-03-28 DIAGNOSIS — E119 Type 2 diabetes mellitus without complications: Secondary | ICD-10-CM | POA: Diagnosis not present

## 2022-03-28 DIAGNOSIS — E875 Hyperkalemia: Secondary | ICD-10-CM

## 2022-03-28 DIAGNOSIS — M79671 Pain in right foot: Secondary | ICD-10-CM | POA: Diagnosis present

## 2022-03-28 DIAGNOSIS — Z79899 Other long term (current) drug therapy: Secondary | ICD-10-CM | POA: Diagnosis not present

## 2022-03-28 DIAGNOSIS — Z7901 Long term (current) use of anticoagulants: Secondary | ICD-10-CM | POA: Insufficient documentation

## 2022-03-28 DIAGNOSIS — I110 Hypertensive heart disease with heart failure: Secondary | ICD-10-CM | POA: Insufficient documentation

## 2022-03-28 DIAGNOSIS — Z91010 Allergy to peanuts: Secondary | ICD-10-CM | POA: Diagnosis not present

## 2022-03-28 DIAGNOSIS — Z7982 Long term (current) use of aspirin: Secondary | ICD-10-CM | POA: Insufficient documentation

## 2022-03-28 DIAGNOSIS — I509 Heart failure, unspecified: Secondary | ICD-10-CM | POA: Insufficient documentation

## 2022-03-28 IMAGING — CR DG FOOT COMPLETE 3+V*R*
3 series · 3 of 3 positions shown · non-contrast
Comparison: No priors.

CLINICAL DATA: 51-year-old male with history of pain in the right
foot. Possible foreign body.

EXAM:
RIGHT FOOT COMPLETE - 3+ VIEW

[foot ap]
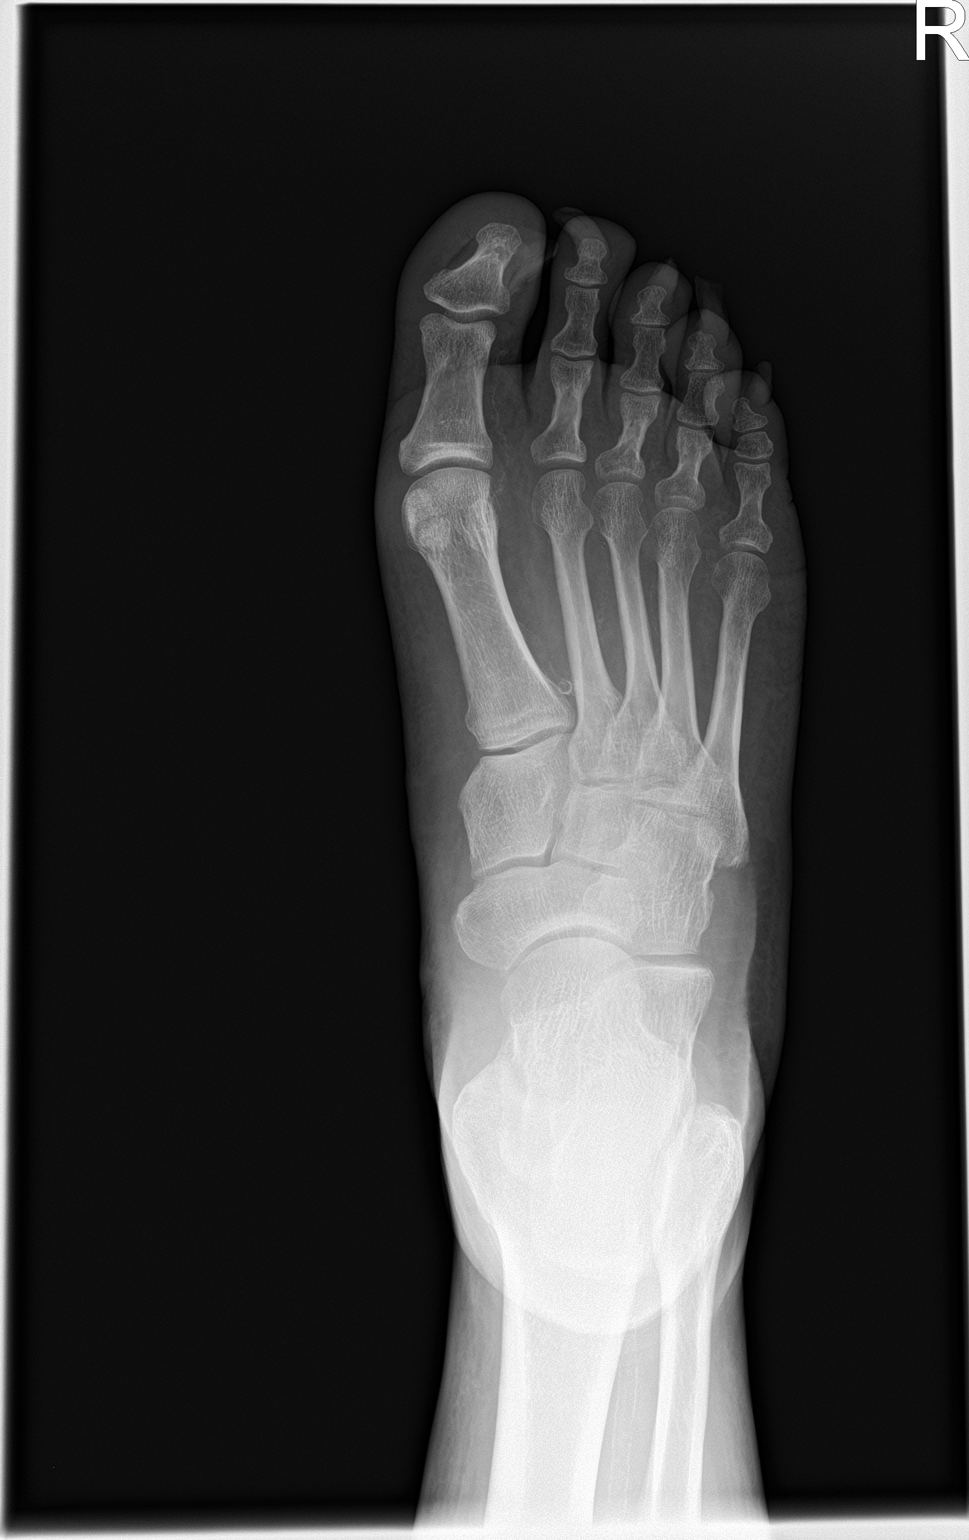

[foot obl]
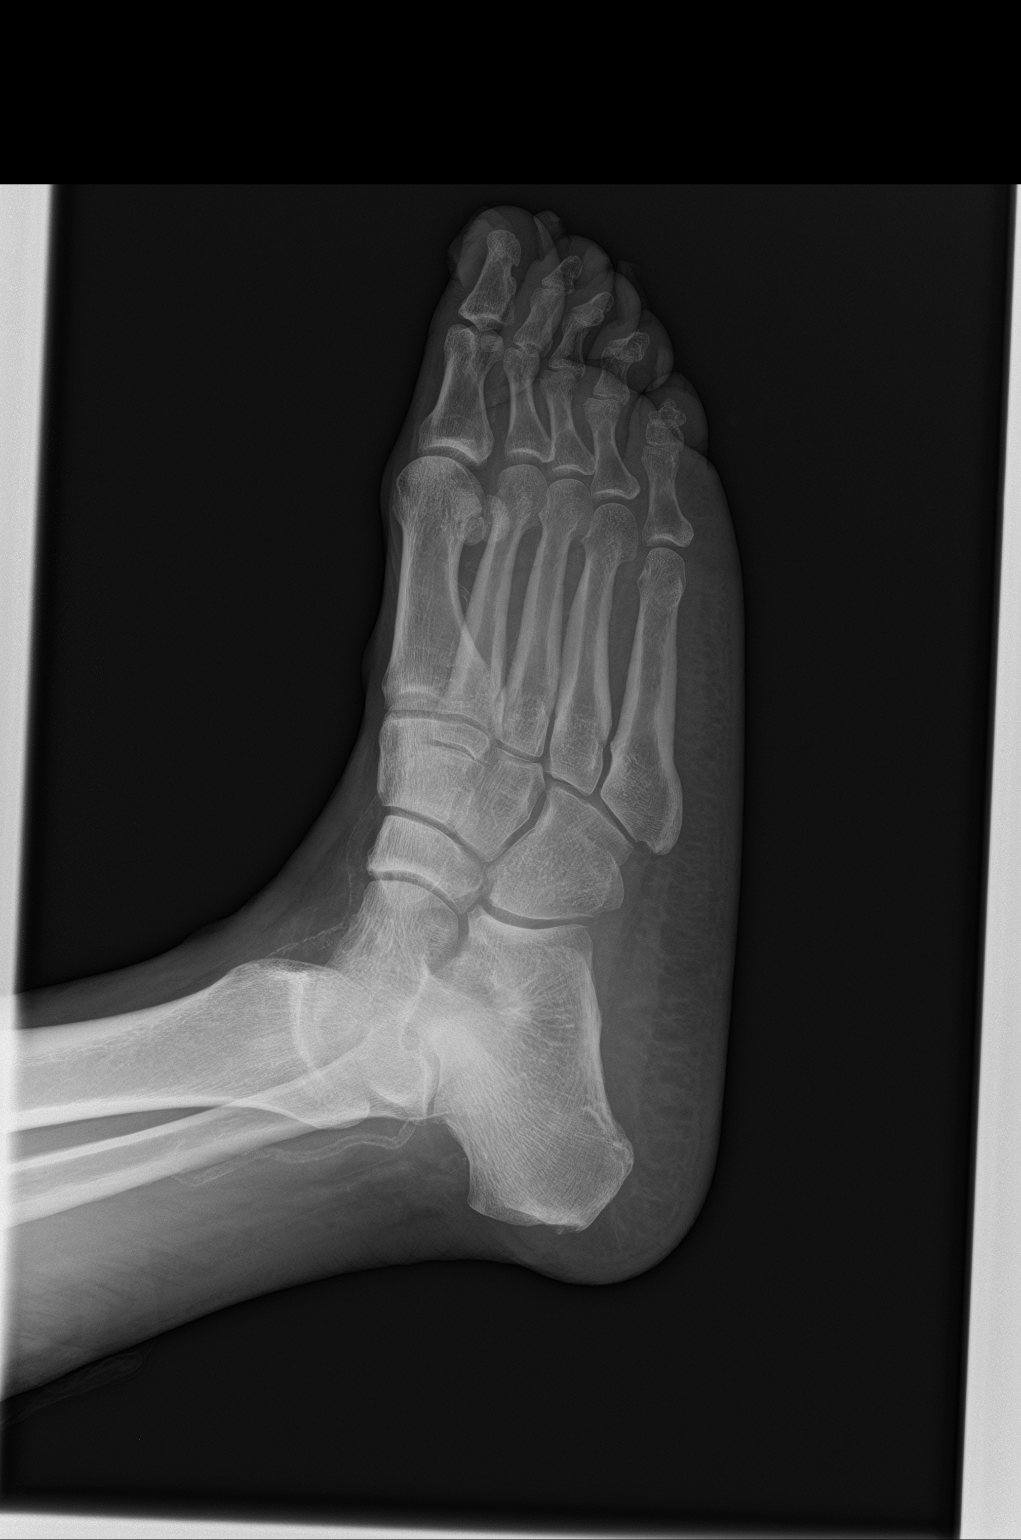

[foot lat]
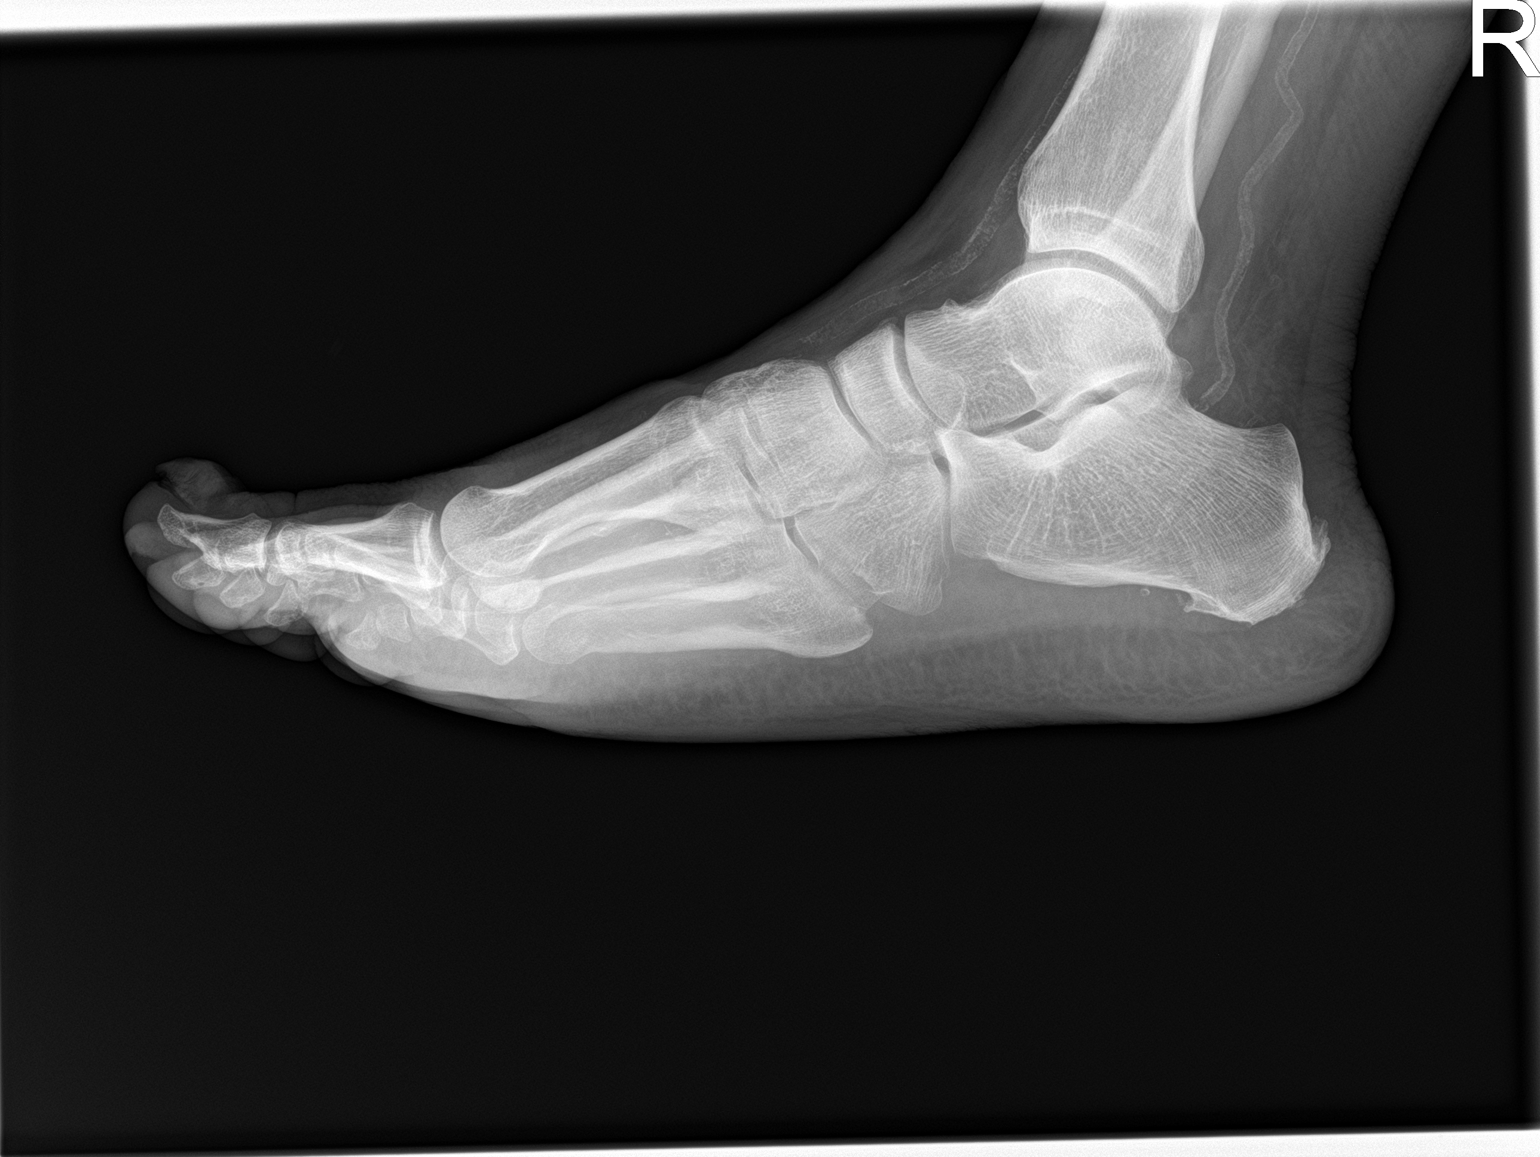

[3 of 3 positions shown; findings below may reference images not displayed]

FINDINGS: There is no evidence of fracture or dislocation. There is no
evidence of arthropathy or other focal bone abnormality. Soft
tissues are unremarkable.
IMPRESSION: Negative.

## 2022-03-28 MED ORDER — DOXYCYCLINE HYCLATE 100 MG PO CAPS: 100.0000 mg | ORAL_CAPSULE | Freq: Two times a day (BID) | ORAL | 0 refills | Status: AC

## 2022-03-28 MED ORDER — CEPHALEXIN 500 MG PO CAPS
500.0000 mg | ORAL_CAPSULE | Freq: Four times a day (QID) | ORAL | 0 refills | Status: AC
Start: 2022-03-28 — End: 2022-04-04

## 2022-03-28 NOTE — ED Provider Triage Note (Signed)
Emergency Medicine Provider Triage Evaluation Note  [name]. , a 51 y.o. male  was evaluated in triage.  Pt complains of right foot pain. Patient states he was walking in his socks and thinks he stepped on something. He is having pain to the arch of the foot, concerned about FB. Hx of DM  Review of Systems  Positive: Foot pain Negative: Fever, chills  Physical Exam  BP 134/77 (BP Location: Right Arm)   Pulse 80   Temp 98.1 F (36.7 C) (Oral)   Resp 18   SpO2 98%  Gen:   Awake, no distress   Resp:  Normal effort  MSK:   TTP to the arch of the right foot.   Medical Decision Making  Medically screening exam initiated at 5:37 AM.  Appropriate orders placed.  [name]. was informed that the remainder of the evaluation will be completed by another provider, this initial triage assessment does not replace that evaluation, and the importance of remaining in the ED until their evaluation is complete.  Foot pain.    [name], [place] 03/28/22 [id number]

## 2022-03-28 NOTE — ED Provider Notes (Signed)
[facility] EMERGENCY DEPARTMENT Provider Note   CSN: [id number] Arrival date & time: 03/28/22  5397     History  Chief Complaint  Patient presents with   Foot Pain    Douglas Koller Griff Badley. is a 51 y.o. male.   Foot Pain    51 year old male with medical history  significant for CHF, atrial fibrillation, HTN, CVA, OSA, DM 2 who presents to the emergency department with a chief complaint of foot pain.   The patient states that he has had foot pain for the last day.  He was walking and thinks that he stepped on something.  He denies any foreign body or laceration.  He has had some pain with bearing weight in his right foot.  He denies any other injuries or complaints.  He denies any recent trauma to the foot.  He states that his tetanus is up-to-date and this was confirmed on review of EMR.  Home Medications Prior to Admission medications   Medication Sig Start Date End Date Taking? Authorizing Provider  cephALEXin (KEFLEX) 500 MG capsule Take 1 capsule (500 mg total) by mouth 4 (four) times daily for 7 days. 03/28/22 04/04/22 Yes Regan Lemming, MD  doxycycline (VIBRAMYCIN) 100 MG capsule Take 1 capsule (100 mg total) by mouth 2 (two) times daily for 7 days. 03/28/22 04/04/22 Yes Regan Lemming, MD  Accu-Chek Softclix Lancets lancets TEST UP TO 4 TIMES A DAY 03/09/22   Wayland Denis, MD  amLODipine (NORVASC) 10 MG tablet Take 1 tablet (10 mg total) by mouth daily. 07/14/21 07/14/22  Maudie Mercury, MD  amphetamine-dextroamphetamine (ADDERALL XR) 30 MG 24 hr capsule Take 1 capsule (30 mg total) by mouth daily as needed (focus). 1 daily as needed 03/01/22   Baird Lyons D, MD  aspirin 81 MG EC tablet Take 1 tablet (81 mg total) by mouth daily. Swallow whole. 08/04/21   Lacinda Axon, MD  atorvastatin (LIPITOR) 80 MG tablet Take 1 tablet (80 mg total) by mouth daily. 04/27/21   [name], MD  blood glucose meter kit and supplies KIT Dispense based on  patient and insurance preference. Use up to four times daily as directed. 12/29/20   [name], MD  Blood Pressure Monitoring (BLOOD PRESSURE MONITOR/L CUFF) MISC 1 Units by Does not apply route daily. 03/19/19   Tacy Learn, PA-C  cetirizine (ZYRTEC ALLERGY) 10 MG tablet Take 1 tablet (10 mg total) by mouth daily. 01/06/22 01/06/23  [name], MD  ELIQUIS 5 MG TABS tablet TAKE 1 TABLET BY MOUTH TWICE A DAY 02/01/22   Farrel Gordon, DO  empagliflozin (JARDIANCE) 25 MG TABS tablet Take 1 tablet (25 mg total) by mouth daily. 01/06/22   [name], MD  flecainide (TAMBOCOR) 100 MG tablet TAKE 1 TABLET BY MOUTH TWICE A DAY 10/12/21   Allred, Jeneen Rinks, MD  fluticasone (FLONASE) 50 MCG/ACT nasal spray Place 1 spray into both nostrils daily. 01/06/22 01/06/23  [name], MD  glucose blood (ACCU-CHEK GUIDE) test strip Please use strips to test morning fasting sugars and one hour after each meal, total 4x daily 03/10/22   Wayland Denis, MD  metFORMIN (GLUCOPHAGE) 1000 MG tablet Take 1 tablet (1,000 mg total) by mouth 2 (two) times daily with a meal. 09/22/21 05/20/22  [name], MD  metoprolol succinate (TOPROL-XL) 100 MG 24 hr tablet TAKE 1 TABLET BY MOUTH DAILY. TAKE WITH OR IMMEDIATELY FOLLOWING A MEAL. 03/02/22   [name], MD  nicotine (NICODERM CQ - DOSED  IN MG/24 HOURS) 21 mg/24hr patch PLACE 1 PATCH (21 MG TOTAL) ONTO THE SKIN DAILY. 12/20/20   Gaylan Gerold, DO  olmesartan (BENICAR) 40 MG tablet Take 1 tablet (40 mg total) by mouth daily. 03/09/22   Wayland Denis, MD  pantoprazole (PROTONIX) 40 MG tablet Take 1 tablet (40 mg total) by mouth daily. 08/26/21   [name], MD  polyethylene glycol (MIRALAX) 17 g packet Take 17 g by mouth daily. 08/17/21   [name], MD  tirzepatide [facility]) 5 MG/0.5ML Pen Inject 5 mg into the skin once a week for 28 days. 03/09/22 04/06/22  Wayland Denis, MD      Allergies    Shrimp [shellfish allergy], Banana, Watermelon  flavor, Almond oil, Other, Peanut-containing drug products, and Sulfa antibiotics    Review of Systems   Review of Systems  All other systems reviewed and are negative.   Physical Exam Updated Vital Signs BP 121/88 (BP Location: Right Arm)   Pulse 74   Temp 98.6 F (37 C) (Oral)   Resp 16   SpO2 98%  Physical Exam Vitals and nursing note reviewed.  Constitutional:      General: He is not in acute distress. HENT:     Head: Normocephalic and atraumatic.  Eyes:     Conjunctiva/sclera: Conjunctivae normal.     Pupils: Pupils are equal, round, and reactive to light.  Cardiovascular:     Rate and Rhythm: Normal rate and regular rhythm.  Pulmonary:     Effort: Pulmonary effort is normal. No respiratory distress.  Abdominal:     General: There is no distension.     Tenderness: There is no guarding.  Musculoskeletal:        General: Swelling and tenderness present. No deformity or signs of injury.     Cervical back: Neck supple.     Comments: Tenderness and mild swelling to the plantar fascia of the right foot, mild erythema present, no lesions or foreign bodies appreciated, no lacerations.  Skin:    Findings: No lesion or rash.  Neurological:     General: No focal deficit present.     Mental Status: He is alert. Mental status is at baseline.     ED Results / Procedures / Treatments   Labs (all labs ordered are listed, but only abnormal results are displayed) Labs Reviewed - No data to display  EKG None  Radiology DG Foot Complete Right  Result Date: 03/28/2022 CLINICAL DATA:  51 year old male with history of pain in the right foot. Possible foreign body. EXAM: RIGHT FOOT COMPLETE - 3+ VIEW COMPARISON:  No priors. FINDINGS: There is no evidence of fracture or dislocation. There is no evidence of arthropathy or other focal bone abnormality. Soft tissues are unremarkable. IMPRESSION: Negative. Electronically Signed   By: Vinnie Langton [name]   On: 03/28/2022 06:03     Procedures Procedures    Medications Ordered in ED Medications - No data to display  ED Course/ Medical Decision Making/ A&P                           Medical Decision Making Risk Prescription drug management.    51 year old male with medical history  significant for CHF, atrial fibrillation, HTN, CVA, OSA, DM 2 who presents to the emergency department with a chief complaint of foot pain.   The patient states that he has had foot pain for the last day.  He was walking and thinks that  he stepped on something.  He denies any foreign body or laceration.  He has had some pain with bearing weight in his right foot.  He denies any other injuries or complaints.  He denies any recent trauma to the foot.  He states that his tetanus is up-to-date and this was confirmed on review of EMR.  On arrival, the patient was vitally stable.  Presenting with right foot pain.  No clear abrasion or laceration identified on exam with no foreign body present. x-ray was obtained with no evidence of foreign body, no evidence of fracture or soft tissue abnormality in the foot.  Differential diagnosis includes plantars fasciitis versus developing cellulitis with some erythema and tenderness to palpation.  We will start the patient on Keflex and doxycycline.  He does plan to follow-up with podiatry within the next week to discuss trimming of his nails.  We will plan for repeat assessment with podiatry to ensure resolution.  Overall stable for discharge.  Postop shoe and crutches provided to assist with ambulation.  Final Clinical Impression(s) / ED Diagnoses Final diagnoses:  Foot pain, right    Rx / DC Orders ED Discharge Orders          Ordered    cephALEXin (KEFLEX) 500 MG capsule  4 times daily        03/28/22 1214    doxycycline (VIBRAMYCIN) 100 MG capsule  2 times daily        03/28/22 1214              Regan Lemming, MD 03/28/22 1412

## 2022-03-28 NOTE — ED Triage Notes (Signed)
Pt is here today due to right foot pain. Pt reports he was walking and think he stepped on something,

## 2022-03-28 NOTE — Progress Notes (Signed)
Orthopedic Tech Progress Note Patient Details:  [name] 01/05/1971 [id number]  Ortho Devices Type of Ortho Device: Crutches, Postop shoe/boot Ortho Device/Splint Location: RLE Ortho Device/Splint Interventions: Ordered, Application, Adjustment   Post Interventions Patient Tolerated: Well, Ambulated well Instructions Provided: Poper ambulation with device, Care of device  [name] 03/28/2022, 1:28 PM

## 2022-03-28 NOTE — Progress Notes (Deleted)
Orthopedic Tech Progress Note Patient Details:  [name] 1971/09/02 [id number]  Ortho Devices Type of Ortho Device: Crutches, Postop shoe/boot Ortho Device/Splint Location: RLE Ortho Device/Splint Interventions: Ordered, Application, Adjustment   Post Interventions Patient Tolerated: Well, Ambulated well Instructions Provided: Poper ambulation with device, Care of device  [name] 03/28/2022, 1:13 PM

## 2022-03-28 NOTE — Discharge Instructions (Signed)
No clear foreign body or laceration identified. Your XR was also negative. Given mild developing redness and swelling with tenderness, we will start you on NSAIDS and a course of antibiotics. Please follow-up with podiatry for a repeat assessment.

## 2022-03-31 ENCOUNTER — Telehealth: Payer: Self-pay | Admitting: Internal Medicine

## 2022-03-31 MED ORDER — AMPHETAMINE-DEXTROAMPHET ER 30 MG PO CP24: 30.0000 mg | ORAL_CAPSULE | Freq: Every day | ORAL | 0 refills | Status: DC | PRN

## 2022-03-31 NOTE — Telephone Encounter (Signed)
Adderall refilled

## 2022-03-31 NOTE — Telephone Encounter (Signed)
Called and spoke with patient, he states he needs a refill of his Adderall sent to his pharmacy, [facility] on [address].  Advised him I would send a message to his pharmacy and once we hear back from Dr. Maple Hudson, we will call him back.  He verbalized understanding.  Thank you.  Dr. Maple Hudson, please advise regarding refill for Adderall.  Thank you.

## 2022-03-31 NOTE — Telephone Encounter (Signed)
Called and spoke with patient, advised him that his Adderall had been refilled. He verbalized understanding.  Nothing further needed.

## 2022-03-31 NOTE — Progress Notes (Signed)
Patient contacted a third time about the need to pick up lokelma sample from out clinic. He states he still needs a ride and will try to get here on Monday to pick this up.

## 2022-04-03 ENCOUNTER — Other Ambulatory Visit (INDEPENDENT_AMBULATORY_CARE_PROVIDER_SITE_OTHER): Payer: 59

## 2022-04-03 DIAGNOSIS — E875 Hyperkalemia: Secondary | ICD-10-CM

## 2022-04-03 NOTE — Addendum Note (Signed)
Addended by: Andrey Campanile on: 04/03/2022 12:59 PM   Modules accepted: Orders

## 2022-04-04 LAB — BMP8+ANION GAP
Anion Gap: 15 mmol/L (ref 10.0–18.0)
BUN/Creatinine Ratio: 16 (ref 9–20)
BUN: 31 mg/dL — ABNORMAL HIGH (ref 6–24)
CO2: 17 mmol/L — ABNORMAL LOW (ref 20–29)
Calcium: 8.9 mg/dL (ref 8.7–10.2)
Chloride: 104 mmol/L (ref 96–106)
Creatinine, Ser: 1.89 mg/dL — ABNORMAL HIGH (ref 0.76–1.27)
Glucose: 141 mg/dL — ABNORMAL HIGH (ref 70–99)
Potassium: 5 mmol/L (ref 3.5–5.2)
Sodium: 136 mmol/L (ref 134–144)
eGFR: 42 mL/min/{1.73_m2} — ABNORMAL LOW (ref >59)

## 2022-04-10 ENCOUNTER — Ambulatory Visit (INDEPENDENT_AMBULATORY_CARE_PROVIDER_SITE_OTHER): Payer: 59 | Admitting: Student

## 2022-04-10 ENCOUNTER — Ambulatory Visit: Payer: 59 | Admitting: Podiatry

## 2022-04-10 ENCOUNTER — Encounter: Payer: Self-pay | Admitting: Student

## 2022-04-10 VITALS — BP 102/59 | HR 76 | Temp 98.4°F | Wt 294.5 lb

## 2022-04-10 DIAGNOSIS — N1832 Chronic kidney disease, stage 3b: Secondary | ICD-10-CM | POA: Diagnosis not present

## 2022-04-10 DIAGNOSIS — E875 Hyperkalemia: Secondary | ICD-10-CM

## 2022-04-10 DIAGNOSIS — Z7985 Long-term (current) use of injectable non-insulin antidiabetic drugs: Secondary | ICD-10-CM

## 2022-04-10 DIAGNOSIS — E1122 Type 2 diabetes mellitus with diabetic chronic kidney disease: Secondary | ICD-10-CM

## 2022-04-10 DIAGNOSIS — I129 Hypertensive chronic kidney disease with stage 1 through stage 4 chronic kidney disease, or unspecified chronic kidney disease: Secondary | ICD-10-CM

## 2022-04-10 DIAGNOSIS — F17210 Nicotine dependence, cigarettes, uncomplicated: Secondary | ICD-10-CM

## 2022-04-10 DIAGNOSIS — E118 Type 2 diabetes mellitus with unspecified complications: Secondary | ICD-10-CM

## 2022-04-10 DIAGNOSIS — I10 Essential (primary) hypertension: Secondary | ICD-10-CM

## 2022-04-10 LAB — BASIC METABOLIC PANEL
Anion gap: 10 (ref 5–15)
BUN: 29 mg/dL — ABNORMAL HIGH (ref 6–20)
CO2: 19 mmol/L — ABNORMAL LOW (ref 22–32)
Calcium: 8.7 mg/dL — ABNORMAL LOW (ref 8.9–10.3)
Chloride: 105 mmol/L (ref 98–111)
Creatinine, Ser: 1.73 mg/dL — ABNORMAL HIGH (ref 0.61–1.24)
GFR, Estimated: 47 mL/min — ABNORMAL LOW (ref >60)
Glucose, Bld: 174 mg/dL — ABNORMAL HIGH (ref 70–99)
Potassium: 4.4 mmol/L (ref 3.5–5.1)
Sodium: 134 mmol/L — ABNORMAL LOW (ref 135–145)

## 2022-04-10 LAB — POCT GLYCOSYLATED HEMOGLOBIN (HGB A1C): Hemoglobin A1C: 7.6 % — AB (ref 4.0–5.6)

## 2022-04-10 LAB — GLUCOSE, CAPILLARY: Glucose-Capillary: 148 mg/dL — ABNORMAL HIGH (ref 70–99)

## 2022-04-10 MED ORDER — OLMESARTAN MEDOXOMIL 20 MG PO TABS: 20.0000 mg | ORAL_TABLET | Freq: Every day | ORAL | 1 refills | Status: DC

## 2022-04-10 NOTE — Assessment & Plan Note (Signed)
Patient with history of chronic kidney disease, with most recent GFR within stage IIIb range.  He has developed intermittent hyperkalemia secondary to this previously requiring Lokelma therapy.  His last BMP did show normal potassium but we will recheck that today.  We will also refer to nephrology to establish care for ongoing monitoring of kidney function.  Plan: -Follow-up BMP -Refer to nephrology  Addendum: BMP showing relatively stable kidney function with GFR in stage IIIa range and normal potassium level.  Patient is aware.

## 2022-04-13 NOTE — Progress Notes (Signed)
Internal Medicine Clinic Attending  Case discussed with Dr. Jinwala  At the time of the visit.  We reviewed the resident's history and exam and pertinent patient test results.  I agree with the assessment, diagnosis, and plan of care documented in the resident's note.  

## 2022-04-17 ENCOUNTER — Other Ambulatory Visit: Payer: Self-pay

## 2022-04-17 ENCOUNTER — Ambulatory Visit (INDEPENDENT_AMBULATORY_CARE_PROVIDER_SITE_OTHER): Payer: 59 | Admitting: Student

## 2022-04-17 ENCOUNTER — Encounter: Payer: Self-pay | Admitting: Student

## 2022-04-17 VITALS — BP 119/72 | HR 71 | Temp 98.1°F | Ht 69.0 in | Wt 292.0 lb

## 2022-04-17 DIAGNOSIS — E118 Type 2 diabetes mellitus with unspecified complications: Secondary | ICD-10-CM

## 2022-04-17 DIAGNOSIS — N2589 Other disorders resulting from impaired renal tubular function: Secondary | ICD-10-CM | POA: Insufficient documentation

## 2022-04-17 DIAGNOSIS — I10 Essential (primary) hypertension: Secondary | ICD-10-CM

## 2022-04-17 DIAGNOSIS — E1122 Type 2 diabetes mellitus with diabetic chronic kidney disease: Secondary | ICD-10-CM | POA: Diagnosis not present

## 2022-04-17 DIAGNOSIS — Z7984 Long term (current) use of oral hypoglycemic drugs: Secondary | ICD-10-CM

## 2022-04-17 DIAGNOSIS — N1831 Chronic kidney disease, stage 3a: Secondary | ICD-10-CM

## 2022-04-17 DIAGNOSIS — Z7985 Long-term (current) use of injectable non-insulin antidiabetic drugs: Secondary | ICD-10-CM

## 2022-04-17 DIAGNOSIS — F17210 Nicotine dependence, cigarettes, uncomplicated: Secondary | ICD-10-CM

## 2022-04-17 DIAGNOSIS — I129 Hypertensive chronic kidney disease with stage 1 through stage 4 chronic kidney disease, or unspecified chronic kidney disease: Secondary | ICD-10-CM

## 2022-04-17 NOTE — Assessment & Plan Note (Addendum)
Patient has a history of T2DM, and is taking Mounjaro 5 mg weekly, Jardiance 25 mg daily, and metformin 1000 mg daily. He stopped taking his Mounjaro last week because of his dizziness, but since he's no longer having any dizziness, I urged him to continue it. He forgot his glucometer for the visit, but reported his glucose was 204 this morning fasting. His last HbA1C was 7.6 done on 6/26, and has been trending downwards.   Plan: - Continue Jardiance, Metformin, and Mounjaro  - Urged to continue glucose monitoring  - BMP done today, will update patient with results   Addendum: Lab results came back and were discussed with the patient.

## 2022-04-17 NOTE — Assessment & Plan Note (Signed)
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 Pt was hyperkalemic (ranging from 5.6-6.5) as well as in metabolic acidosis. This could have been caused by olmesartan. However recent labs show that the condition is getting better, as the olmesartan decrease has led to a resolution in potassium

## 2022-04-17 NOTE — Assessment & Plan Note (Addendum)
Pt has a history of Chronic Kidney Disease stage 3a (most recent GFR was 47 obtained on 04/03/22. His potassium has resolved and is currently at 4.5 He hasn't received a call regarding seeing a nephrologist, however based off labs his potassium is trending down so referral is not needed.  Plan -Control blood pressure with reduced dose of olmesartan, and regular medication regime (as seen in medication list)  - Control blood sugar with medication regimen  - BMP done today, will review results with patient as soon as available.  Addendum: BMP results a potassium level of 5.5, and given the patients history of hyperkalemia, will prescribe Patiromer 8.4 mg to help bring potassium to normal.

## 2022-04-17 NOTE — Progress Notes (Addendum)
CC: F/u for lightheadedness  HPI:  [name] is a 51 y.o. male with a PMH as shown below, who is here for a follow up appointment from 04/10/2022 for occasional lightheadedness and T2DM. Please see individual problem based charting for full of HPI   Past Medical History:  Diagnosis Date   Acute pancreatitis 01/22/2019   Diabetes mellitus    GERD (gastroesophageal reflux disease)    History of alcohol abuse    History of cocaine use    History of echocardiogram    a. Echo 4/14: Moderate LVH, vigorous LVEF, EF 65-70%, normal wall motion, grade 2 diastolic dysfunction, mildly dilated aortic root and ascending aorta, ascending aorta 40 mm, aortic root 38 mm, mild LAE   Hx of cardiovascular stress test    a. GXT 5/14: No ischemic changes  //  b. ETT-Myoview 3/16:  Low risk, no ischemia, EF 58%   Hyperlipidemia    Hypertension    Morbid obesity ([place])    Paroxysmal atrial fibrillation ([place])    Occurring in 2008, with several recurrence since then (including in the setting of + cocaine on UDS).   Sleep apnea    uses CPAP   Stroke ([place])    x3   SVT (supraventricular tachycardia) ([place])    mid to long RP SVT 09/2019   Review of Systems:  Review of Systems  Constitutional: Negative.   Respiratory: Negative.    Cardiovascular: Negative.   Skin: Negative.      Physical Exam:  Vitals:   04/17/22 0926  BP: 119/72  Pulse: 71  Temp: 98.1 F (36.7 C)  TempSrc: Oral  SpO2: 98%  Weight: 292 lb (132.5 kg)  Height: _0  (1.753 m)   General: Alert and orientated x3. Patient is resting comfortably in bed in no acute distress  Cardio: Regular rate and rhythm, no murmurs, rubs or gallops. 2+ pulses to bilateral upper and lower extremities  Chest: No chest tenderness Pulmonary: Clear to ausculation bilaterally with no rales, rhonchi, and crackles  Back: No midline tenderness, no step off or deformities noted. No paraspinal muscle tenderness.  Skin: No rashes noted   MSK: 5/5 strength to upper and lower extremities.   Allergies as of 04/17/2022       Reactions   Shrimp [shellfish Allergy] Anaphylaxis   Banana Other (See Comments)   Pt gags   Watermelon Flavor Nausea And Vomiting   Almond Oil Itching   Roof of mouth itches   Other Itching   Grapes cause itching   Peanut-containing Drug Products Itching, Cough   Sulfa Antibiotics Itching        Medication List        Accurate as of April 17, 2022 11:59 PM. If you have any questions, ask your nurse or doctor.          Accu-Chek Guide test strip Generic drug: glucose blood Please use strips to test morning fasting sugars and one hour after each meal, total 4x daily   Accu-Chek Softclix Lancets lancets TEST UP TO 4 TIMES A DAY   amLODipine 10 MG tablet Commonly known as: NORVASC Take 1 tablet (10 mg total) by mouth daily.   amphetamine-dextroamphetamine 30 MG 24 hr capsule Commonly known as: ADDERALL XR Take 1 capsule (30 mg total) by mouth daily as needed (focus). 1 daily as needed   aspirin EC 81 MG tablet Take 1 tablet (81 mg total) by mouth daily. Swallow whole.   atorvastatin 80 MG tablet  Commonly known as: LIPITOR Take 1 tablet (80 mg total) by mouth daily.   blood glucose meter kit and supplies Kit Dispense based on patient and insurance preference. Use up to four times daily as directed.   Blood Pressure Monitor/L Cuff Misc 1 Units by Does not apply route daily.   cetirizine 10 MG tablet Commonly known as: ZyrTEC Allergy Take 1 tablet (10 mg total) by mouth daily.   Eliquis 5 MG Tabs tablet Generic drug: apixaban TAKE 1 TABLET BY MOUTH TWICE A DAY   empagliflozin 25 MG Tabs tablet Commonly known as: Jardiance Take 1 tablet (25 mg total) by mouth daily.   flecainide 100 MG tablet Commonly known as: TAMBOCOR TAKE 1 TABLET BY MOUTH TWICE A DAY   fluticasone 50 MCG/ACT nasal spray Commonly known as: FLONASE Place 1 spray into both nostrils daily.    metFORMIN 1000 MG tablet Commonly known as: GLUCOPHAGE Take 1 tablet (1,000 mg total) by mouth 2 (two) times daily with a meal.   metoprolol succinate 100 MG 24 hr tablet Commonly known as: TOPROL-XL TAKE 1 TABLET BY MOUTH DAILY. TAKE WITH OR IMMEDIATELY FOLLOWING A MEAL.   nicotine 21 mg/24hr patch Commonly known as: NICODERM CQ - dosed in mg/24 hours PLACE 1 PATCH (21 MG TOTAL) ONTO THE SKIN DAILY.   olmesartan 20 MG tablet Commonly known as: BENICAR Take 1 tablet (20 mg total) by mouth daily.   pantoprazole 40 MG tablet Commonly known as: PROTONIX Take 1 tablet (40 mg total) by mouth daily.   patiromer 8.4 g packet Commonly known as: VELTASSA Take 1 packet (8.4 g total) by mouth daily. Started by: Drucie Opitz, MD   polyethylene glycol 17 g packet Commonly known as: MiraLax Take 17 g by mouth daily.             Assessment & Plan:   See Encounters Tab for problem based charting.   Type 2 diabetes mellitus with complications [facility]) Patient has a history of T2DM, and is taking Mounjaro 5 mg weekly, Jardiance 25 mg daily, and metformin 1000 mg daily. He stopped taking his Mounjaro last week because of his dizziness, but since he's no longer having any dizziness, I urged him to continue it. He forgot his glucometer for the visit, but reported his glucose was 204 this morning fasting. His last HbA1C was 7.6 done on 6/26, and has been trending downwards.   Plan: - Continue Jardiance, Metformin, and Mounjaro  - Urged to continue glucose monitoring  - BMP done today, will update patient with results   Addendum: Lab results came back and were discussed with the patient.   Benign essential hypertension Patient is currently taking Norvasc 10 mg, Toprol 100 mg, and olmesartan 20 mg. It seems the change from 40 mg to 20 mg of olmesartan has worked because he isn't experiencing the dizziness anymore and fatigue has improved drastically. Orthostatics taken today were  negative, and sitting blood pressure was 119/72. Pt denies taking blood pressure at home for monitoring, and I urged him to change that.   Plan:  - Continue Norvasc, Toprol, Jardiance, and Olmesartan - Follow up in 3 months  - Continue diet and exercise    Chronic kidney disease, stage 3 (HCC) Pt has a history of Chronic Kidney Disease stage 3a (most recent GFR was 47 obtained on 04/03/22. His potassium has resolved and is currently at 4.5 He hasn't received a call regarding seeing a nephrologist, however based off labs his potassium is trending  down so referral is not needed.  Plan -Control blood pressure with reduced dose of olmesartan, and regular medication regime (as seen in medication list)  - Control blood sugar with medication regimen  - BMP done today, will review results with patient as soon as available.  Addendum: BMP results a potassium level of 5.5, and given the patients history of hyperkalemia, will prescribe Patiromer 8.4 mg to help bring potassium to normal.   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 Pt was hyperkalemic (ranging from 5.6-6.5) as well as in metabolic acidosis. This could have been caused by olmesartan. However recent labs show that the condition is getting better, as the olmesartan decrease has led to a resolution in potassium    Patient seen with Dr. Heber [place]

## 2022-04-17 NOTE — Assessment & Plan Note (Addendum)
Patient is currently taking Norvasc 10 mg, Toprol 100 mg, and olmesartan 20 mg. It seems the change from 40 mg to 20 mg of olmesartan has worked because he isn't experiencing the dizziness anymore and fatigue has improved drastically. Orthostatics taken today were negative, and sitting blood pressure was 119/72. Pt denies taking blood pressure at home for monitoring, and I urged him to change that.   Plan:  - Continue Norvasc, Toprol, Jardiance, and Olmesartan - Follow up in 3 months  - Continue diet and exercise

## 2022-04-17 NOTE — Patient Instructions (Addendum)
[name] it was great seeing you today! Today we talked about the following:   We discussed how the change of Olmesartan from 40 mg to 20 mg has helped with the dizziness and fatigue, glad to hear you're feeling better.  Your sugar levels are getting better, so we're going to keep you on the same medication regimen. Hopefully as you continue your diet and lose weight your sugar levels will drop as well. Your blood pressure is also doing a lot better, so we're also going to keep you on the same medication. We got some blood work today to check on your potassium and kidneys. Please follow up in 3 months  Please call our clinic at [phone] if you have any questions or concerns. The best time to call is Monday-Friday from 9am-4pm, but there is someone available 24/7 at the same number. If you need medication refills, please notify your pharmacy one week in advance and they will send [place] a request.   Thank you for letting us take part in your care. We look forward to seeing you next time!

## 2022-04-18 LAB — BMP8+ANION GAP
Anion Gap: 14 mmol/L (ref 10.0–18.0)
BUN/Creatinine Ratio: 22 — ABNORMAL HIGH (ref 9–20)
BUN: 26 mg/dL — ABNORMAL HIGH (ref 6–24)
CO2: 20 mmol/L (ref 20–29)
Calcium: 9.6 mg/dL (ref 8.7–10.2)
Chloride: 105 mmol/L (ref 96–106)
Creatinine, Ser: 1.17 mg/dL (ref 0.76–1.27)
Glucose: 183 mg/dL — ABNORMAL HIGH (ref 70–99)
Potassium: 5.5 mmol/L — ABNORMAL HIGH (ref 3.5–5.2)
Sodium: 139 mmol/L (ref 134–144)
eGFR: 75 mL/min/{1.73_m2} (ref >59)

## 2022-04-19 MED ORDER — PATIROMER SORBITEX CALCIUM 8.4 G PO PACK: 8.4000 g | PACK | Freq: Every day | ORAL | Status: DC

## 2022-04-19 NOTE — Addendum Note (Signed)
Addended byOlegario Messier on: 04/19/2022 03:28 PM   Modules accepted: Orders

## 2022-04-24 ENCOUNTER — Telehealth: Payer: Self-pay

## 2022-04-24 MED ORDER — PATIROMER SORBITEX CALCIUM 8.4 G PO PACK: 8.4000 g | PACK | Freq: Every day | ORAL | 3 refills | Status: DC

## 2022-04-24 NOTE — Telephone Encounter (Signed)
Patient notified Rx for Veltassa went to [facility] at 1:32 today. He is advised to go over instructions with Pharmacist so he feels comfortable with how to take med. He is very Adult nurse.

## 2022-04-24 NOTE — Telephone Encounter (Signed)
Pt states potassium medication is not at the pharmacy. Please call pt back.

## 2022-04-24 NOTE — Addendum Note (Signed)
Addended byOlegario Messier on: 04/24/2022 01:42 PM   Modules accepted: Orders

## 2022-05-01 ENCOUNTER — Telehealth: Payer: Self-pay | Admitting: Internal Medicine

## 2022-05-01 MED ORDER — AMPHETAMINE-DEXTROAMPHET ER 30 MG PO CP24: 30.0000 mg | ORAL_CAPSULE | Freq: Every day | ORAL | 0 refills | Status: DC | PRN

## 2022-05-01 NOTE — Telephone Encounter (Signed)
Adderall refilled [facility]

## 2022-05-01 NOTE — Telephone Encounter (Signed)
Dr. Annamaria Boots, please advise if you are okay refilling pt's Adderall. Pharmacy is [facility] on [address].  Allergies  Allergen Reactions   Shrimp [Shellfish Allergy] Anaphylaxis   Banana Other (See Comments)    Pt gags   Watermelon Flavor Nausea And Vomiting   Almond Oil Itching    Roof of mouth itches   Other Itching    Grapes cause itching   Peanut-Containing Drug Products Itching and Cough   Sulfa Antibiotics Itching    Current Outpatient Medications:    Accu-Chek Softclix Lancets lancets, TEST UP TO 4 TIMES A DAY, Disp: 100 each, Rfl: 2   amLODipine (NORVASC) 10 MG tablet, Take 1 tablet (10 mg total) by mouth daily., Disp: 30 tablet, Rfl: 11   amphetamine-dextroamphetamine (ADDERALL XR) 30 MG 24 hr capsule, Take 1 capsule (30 mg total) by mouth daily as needed (focus). 1 daily as needed, Disp: 30 capsule, Rfl: 0   aspirin 81 MG EC tablet, Take 1 tablet (81 mg total) by mouth daily. Swallow whole., Disp: 90 tablet, Rfl: 1   atorvastatin (LIPITOR) 80 MG tablet, Take 1 tablet (80 mg total) by mouth daily., Disp: 90 tablet, Rfl: 3   blood glucose meter kit and supplies KIT, Dispense based on patient and insurance preference. Use up to four times daily as directed., Disp: 1 each, Rfl: 0   Blood Pressure Monitoring (BLOOD PRESSURE MONITOR/L CUFF) MISC, 1 Units by Does not apply route daily., Disp: 1 each, Rfl: 0   cetirizine (ZYRTEC ALLERGY) 10 MG tablet, Take 1 tablet (10 mg total) by mouth daily., Disp: 30 tablet, Rfl: 2   ELIQUIS 5 MG TABS tablet, TAKE 1 TABLET BY MOUTH TWICE A DAY, Disp: 60 tablet, Rfl: 5   empagliflozin (JARDIANCE) 25 MG TABS tablet, Take 1 tablet (25 mg total) by mouth daily., Disp: 90 tablet, Rfl: 2   flecainide (TAMBOCOR) 100 MG tablet, TAKE 1 TABLET BY MOUTH TWICE A DAY, Disp: 60 tablet, Rfl: 7   fluticasone (FLONASE) 50 MCG/ACT nasal spray, Place 1 spray into both nostrils daily., Disp: 15.8 mL, Rfl: 2   glucose blood (ACCU-CHEK GUIDE) test strip, Please use  strips to test morning fasting sugars and one hour after each meal, total 4x daily, Disp: 200 strip, Rfl: 5   metFORMIN (GLUCOPHAGE) 1000 MG tablet, Take 1 tablet (1,000 mg total) by mouth 2 (two) times daily with a meal., Disp: 120 tablet, Rfl: 3   metoprolol succinate (TOPROL-XL) 100 MG 24 hr tablet, TAKE 1 TABLET BY MOUTH DAILY. TAKE WITH OR IMMEDIATELY FOLLOWING A MEAL., Disp: 90 tablet, Rfl: 3   nicotine (NICODERM CQ - DOSED IN MG/24 HOURS) 21 mg/24hr patch, PLACE 1 PATCH (21 MG TOTAL) ONTO THE SKIN DAILY., Disp: 28 patch, Rfl: 1   olmesartan (BENICAR) 20 MG tablet, Take 1 tablet (20 mg total) by mouth daily., Disp: 30 tablet, Rfl: 1   pantoprazole (PROTONIX) 40 MG tablet, Take 1 tablet (40 mg total) by mouth daily., Disp: 90 tablet, Rfl: 3   patiromer (VELTASSA) 8.4 g packet, Take 1 packet (8.4 g total) by mouth daily., Disp: 30 each, Rfl: 3   polyethylene glycol (MIRALAX) 17 g packet, Take 17 g by mouth daily., Disp: 14 each, Rfl: 0

## 2022-05-01 NOTE — Telephone Encounter (Signed)
Called and spoke with patient. He is aware that the refill has been sent in.   Nothing further needed at time of call.

## 2022-05-02 ENCOUNTER — Telehealth: Payer: Self-pay

## 2022-05-02 NOTE — Telephone Encounter (Signed)
PA for pt [facility] ) came through on cover my meds was submitted .Marland Kitchen But a message popped up cant find patient .Marland Kitchen     AS FOLLOWS :   Patient Savings Program: Click Here. ? There was an error with your request  ? Cannot find matching patient with Name and Date of Birth provided. For additional information, please contact the phone number on the back of the member prescription ID card.       ( THIS MESSAGE SEEMS TO POP UP FOR ALL HIS PA ... PA MAY NEED TO TAKE NEW INSURANCE CARD TO PHARMACY .....   PHARMACY SENT A COPY ALSO )

## 2022-05-08 ENCOUNTER — Ambulatory Visit: Payer: 59 | Admitting: Podiatry

## 2022-05-08 ENCOUNTER — Encounter (INDEPENDENT_AMBULATORY_CARE_PROVIDER_SITE_OTHER): Payer: 59 | Admitting: Ophthalmology

## 2022-05-09 ENCOUNTER — Ambulatory Visit (INDEPENDENT_AMBULATORY_CARE_PROVIDER_SITE_OTHER): Payer: 59 | Admitting: Ophthalmology

## 2022-05-09 ENCOUNTER — Encounter (INDEPENDENT_AMBULATORY_CARE_PROVIDER_SITE_OTHER): Payer: 59 | Admitting: Ophthalmology

## 2022-05-09 ENCOUNTER — Encounter (INDEPENDENT_AMBULATORY_CARE_PROVIDER_SITE_OTHER): Payer: Self-pay | Admitting: Ophthalmology

## 2022-05-09 DIAGNOSIS — H2513 Age-related nuclear cataract, bilateral: Secondary | ICD-10-CM

## 2022-05-09 DIAGNOSIS — E113491 Type 2 diabetes mellitus with severe nonproliferative diabetic retinopathy without macular edema, right eye: Secondary | ICD-10-CM

## 2022-05-09 DIAGNOSIS — E113493 Type 2 diabetes mellitus with severe nonproliferative diabetic retinopathy without macular edema, bilateral: Secondary | ICD-10-CM

## 2022-05-09 DIAGNOSIS — E113412 Type 2 diabetes mellitus with severe nonproliferative diabetic retinopathy with macular edema, left eye: Secondary | ICD-10-CM | POA: Diagnosis not present

## 2022-05-09 DIAGNOSIS — H269 Unspecified cataract: Secondary | ICD-10-CM

## 2022-05-09 LAB — HM DIABETES EYE EXAM

## 2022-05-09 NOTE — Assessment & Plan Note (Signed)
Minor in the past OS not active currently continue to observe

## 2022-05-09 NOTE — Assessment & Plan Note (Signed)
Please also diabetic in nature but does hamper visualization the peripheral retina substantially

## 2022-05-09 NOTE — Progress Notes (Signed)
05/09/2022     CHIEF COMPLAINT Patient presents for  Chief Complaint  Patient presents with   Diabetic Retinopathy without Macular Edema      HISTORY OF PRESENT ILLNESS: [name]. is a 51 y.o. male who presents to the clinic today for:   HPI   56 mth fu ou oct fp  Pt states his vision has been stable Pt denies any new floaters or FOL  Last edited by Morene Rankins, CMA on 05/09/2022  8:06 AM.      Referring physician: [name], MD [place],  [place] [address]  HISTORICAL INFORMATION:   Selected notes from the [facility]    Lab Results  Component Value Date   HGBA1C 7.6 (A) 04/10/2022     CURRENT MEDICATIONS: No current outpatient medications on file. (Ophthalmic Drugs)   No current facility-administered medications for this visit. (Ophthalmic Drugs)   Current Outpatient Medications (Other)  Medication Sig   Accu-Chek Softclix Lancets lancets TEST UP TO 4 TIMES A DAY   amLODipine (NORVASC) 10 MG tablet Take 1 tablet (10 mg total) by mouth daily.   amphetamine-dextroamphetamine (ADDERALL XR) 30 MG 24 hr capsule Take 1 capsule (30 mg total) by mouth daily as needed (focus). 1 daily as needed   aspirin 81 MG EC tablet Take 1 tablet (81 mg total) by mouth daily. Swallow whole.   atorvastatin (LIPITOR) 80 MG tablet Take 1 tablet (80 mg total) by mouth daily.   blood glucose meter kit and supplies KIT Dispense based on patient and insurance preference. Use up to four times daily as directed.   Blood Pressure Monitoring (BLOOD PRESSURE MONITOR/L CUFF) MISC 1 Units by Does not apply route daily.   cetirizine (ZYRTEC ALLERGY) 10 MG tablet Take 1 tablet (10 mg total) by mouth daily.   ELIQUIS 5 MG TABS tablet TAKE 1 TABLET BY MOUTH TWICE A DAY   empagliflozin (JARDIANCE) 25 MG TABS tablet Take 1 tablet (25 mg total) by mouth daily.   flecainide (TAMBOCOR) 100 MG tablet TAKE 1 TABLET BY MOUTH TWICE A DAY   fluticasone (FLONASE)  50 MCG/ACT nasal spray Place 1 spray into both nostrils daily.   glucose blood (ACCU-CHEK GUIDE) test strip Please use strips to test morning fasting sugars and one hour after each meal, total 4x daily   metFORMIN (GLUCOPHAGE) 1000 MG tablet Take 1 tablet (1,000 mg total) by mouth 2 (two) times daily with a meal.   metoprolol succinate (TOPROL-XL) 100 MG 24 hr tablet TAKE 1 TABLET BY MOUTH DAILY. TAKE WITH OR IMMEDIATELY FOLLOWING A MEAL.   nicotine (NICODERM CQ - DOSED IN MG/24 HOURS) 21 mg/24hr patch PLACE 1 PATCH (21 MG TOTAL) ONTO THE SKIN DAILY.   olmesartan (BENICAR) 20 MG tablet Take 1 tablet (20 mg total) by mouth daily.   pantoprazole (PROTONIX) 40 MG tablet Take 1 tablet (40 mg total) by mouth daily.   patiromer (VELTASSA) 8.4 g packet Take 1 packet (8.4 g total) by mouth daily.   polyethylene glycol (MIRALAX) 17 g packet Take 17 g by mouth daily.   No current facility-administered medications for this visit. (Other)      REVIEW OF SYSTEMS: ROS   Negative for: Constitutional, Gastrointestinal, Neurological, Skin, Genitourinary, Musculoskeletal, HENT, Endocrine, Cardiovascular, Eyes, Respiratory, Psychiatric, Allergic/Imm, Heme/Lymph Last edited by Hurman Horn, MD on 05/09/2022  8:40 AM.       ALLERGIES Allergies  Allergen Reactions   Shrimp [Shellfish Allergy]  Anaphylaxis   Banana Other (See Comments)    Pt gags   Watermelon Flavor Nausea And Vomiting   Almond Oil Itching    Roof of mouth itches   Other Itching    Grapes cause itching   Peanut-Containing Drug Products Itching and Cough   Sulfa Antibiotics Itching    PAST MEDICAL HISTORY Past Medical History:  Diagnosis Date   Acute pancreatitis 01/22/2019   Diabetes mellitus    GERD (gastroesophageal reflux disease)    History of alcohol abuse    History of cocaine use    History of echocardiogram    a. Echo 4/14: Moderate LVH, vigorous LVEF, EF 65-70%, normal wall motion, grade 2 diastolic dysfunction,  mildly dilated aortic root and ascending aorta, ascending aorta 40 mm, aortic root 38 mm, mild LAE   Hx of cardiovascular stress test    a. GXT 5/14: No ischemic changes  //  b. ETT-Myoview 3/16:  Low risk, no ischemia, EF 58%   Hyperlipidemia    Hypertension    Morbid obesity ([place])    Paroxysmal atrial fibrillation ([place])    Occurring in 2008, with several recurrence since then (including in the setting of + cocaine on UDS).   Sleep apnea    uses CPAP   Stroke ([place])    x3   SVT (supraventricular tachycardia) ([place])    mid to long RP SVT 09/2019   Past Surgical History:  Procedure Laterality Date   APPENDECTOMY     ATRIAL FIBRILLATION ABLATION N/A 11/27/2019   Procedure: ATRIAL FIBRILLATION ABLATION;  Surgeon: Thompson Grayer, MD;  Location: [facility];  Service: Cardiovascular;  Laterality: N/A;   BUBBLE STUDY  05/20/2021   Procedure: BUBBLE STUDY;  Surgeon: Donato Heinz, MD;  Location: [facility];  Service: Cardiovascular;;   ROTATOR CUFF REPAIR     SVT ABLATION N/A 11/27/2019   Procedure: SVT ABLATION;  Surgeon: Thompson Grayer, MD;  Location: [facility];  Service: Cardiovascular;  Laterality: N/A;   TEE WITHOUT CARDIOVERSION N/A 05/20/2021   Procedure: TRANSESOPHAGEAL ECHOCARDIOGRAM (TEE);  Surgeon: Donato Heinz, MD;  Location[facility];  Service: Cardiovascular;  Laterality: N/A;   TONSILLECTOMY      FAMILY HISTORY Family History  Problem Relation Age of Onset   Diabetes Mother    Heart attack Father 78   Stroke Maternal Grandmother    Stroke Paternal Grandmother    Diabetes Paternal Grandfather     SOCIAL HISTORY Social History   Tobacco Use   Smoking status: Every Day    Packs/day: 0.50    Years: 28.00    Total pack years: 14.00    Types: Cigarettes   Smokeless tobacco: Former    Types: Snuff   Tobacco comments:    1 pack per week [facility] 12/09/2021  Vaping Use   Vaping Use: Never used  Substance Use Topics   Alcohol use: Yes     Alcohol/week: 6.0 standard drinks of alcohol    Types: 6 Standard drinks or equivalent per week    Comment: rarely   Drug use: Not Currently    Comment: cocaine in the past, none currently         OPHTHALMIC EXAM:  Base Eye Exam     Visual Acuity (ETDRS)       Right Left   Dist [place] 20/20 20/25         Tonometry (Tonopen, 8:14 AM)       Right Left   Pressure 15  18         Pupils       Shape   Right Round   Left          Neuro/Psych     Oriented x3: Yes   Mood/Affect: Normal         Dilation     Both eyes: 2.5% Phenylephrine, 1.0% Mydriacyl @ 8:08 AM           Slit Lamp and Fundus Exam     External Exam       Right Left   External Normal Normal         Slit Lamp Exam       Right Left   Lids/Lashes Normal Normal   Conjunctiva/Sclera White and quiet White and quiet   Cornea Clear Clear   Anterior Chamber Deep and quiet Deep and quiet   Iris Round and reactive Round and reactive   Lens 1+ Cortical cataract, 2+ Nuclear sclerosis 1+ Cortical cataract, 2+ Nuclear sclerosis   Anterior Vitreous Normal Normal         Fundus Exam       Right Left   Posterior Vitreous Normal Normal   Disc Normal Normal   C/D Ratio 0.35 0.35   Macula Normal Normal   Vessels NPDR-Severe, hypertensive retinopathy, Arteriolar narrowing mild NPDR-Severe, hypertensive retinopathy, Arteriolar narrowing   Periphery No cotton-wool spots seen in the posterior pole No cotton-wool spots seen in the posterior pole            IMAGING AND PROCEDURES  Imaging and Procedures for 05/09/22  OCT, Retina - OU - Both Eyes       Right Eye Quality was good. Scan locations included subfoveal. Central Foveal Thickness: 223. Progression has been stable.   Left Eye Quality was poor. Scan locations included subfoveal. Progression has no prior data.   Notes Diffuse macular atrophy OD,, Poor view thru cataract OS      Color Fundus Photography Optos - OU - Both  Eyes       Right Eye Progression has improved. Disc findings include normal observations. Macula : microaneurysms.   Left Eye Progression has improved. Disc findings include normal observations. Macula : microaneurysms.   Notes Moderate to severe nonproliferative diabetic retinopathy.  No cotton-wool spots seen.                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  Now darkening of the vision in each eye.  Recommend consideration of cataract evaluation  Cortical cataract of both eyes Please also diabetic in nature but does hamper visualization the peripheral retina substantially     ICD-10-CM   1. 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 without macular edema associated with type 2 diabetes mellitus (HCC)  E11.3493 OCT, Retina - OU - Both Eyes    Color Fundus Photography Optos - OU - Both Eyes    2. 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 Severe nonproliferative diabetic retinopathy of right eye without macular edema associated with type 2 diabetes mellitus ([place])  E11.3491     4. Nuclear sclerotic cataract of both eyes  H25.13  5. Cortical cataract of both eyes  H26.9       1.  OU severe NPDR continue to follow and monitor.  No active maculopathy with preserved acuity  2.  Diabetic cataract in each eye nuclear sclerotic and cortical recommend cataract evaluation  3.  Ophthalmic Meds Ordered this visit:  No orders of the defined types were placed in this encounter.      Return in about 4 months (around 09/09/2022) for DILATE OU, COLOR FP, OCT.  There  are no Patient Instructions on file for this visit.   Explained the diagnoses, plan, and follow up with the patient and they expressed understanding.  Patient expressed understanding of the importance of proper follow up care.   Clent Demark Ciel Yanes [name] Diseases & Surgery of the Retina and Vitreous Retina & Diabetic [place] 05/09/22     Abbreviations: M myopia (nearsighted); A astigmatism; H hyperopia (farsighted); P presbyopia; Mrx spectacle prescription;  CTL contact lenses; OD right eye; OS left eye; OU both eyes  XT exotropia; ET esotropia; PEK punctate epithelial keratitis; PEE punctate epithelial erosions; DES dry eye syndrome; MGD meibomian gland dysfunction; ATs artificial tears; PFAT's preservative free artificial tears; [place] nuclear sclerotic cataract; PSC posterior subcapsular cataract; ERM epi-retinal membrane; PVD posterior vitreous detachment; RD retinal detachment; DM diabetes mellitus; DR diabetic retinopathy; NPDR non-proliferative diabetic retinopathy; PDR proliferative diabetic retinopathy; CSME clinically significant macular edema; DME diabetic macular edema; dbh dot blot hemorrhages; CWS cotton wool spot; POAG primary open angle glaucoma; C/D cup-to-disc ratio; HVF humphrey visual field; GVF goldmann visual field; OCT optical coherence tomography; IOP intraocular pressure; BRVO Branch retinal vein occlusion; CRVO central retinal vein occlusion; CRAO central retinal artery occlusion; BRAO branch retinal artery occlusion; RT retinal tear; SB scleral buckle; PPV pars plana vitrectomy; VH Vitreous hemorrhage; PRP panretinal laser photocoagulation; IVK intravitreal kenalog; VMT vitreomacular traction; MH Macular hole;  NVD neovascularization of the disc; NVE neovascularization elsewhere; AREDS age related eye disease study; ARMD age related macular degeneration; POAG primary open angle glaucoma; EBMD epithelial/anterior basement membrane dystrophy; ACIOL anterior chamber intraocular lens;  IOL intraocular lens; PCIOL posterior chamber intraocular lens; Phaco/IOL phacoemulsification with intraocular lens placement; [place] photorefractive keratectomy; LASIK laser assisted in situ keratomileusis; HTN hypertension; DM diabetes mellitus; COPD chronic obstructive pulmonary disease

## 2022-05-09 NOTE — Assessment & Plan Note (Signed)
Progressive NSC changes affiliated and associated and exacerbated by diabetes mellitus.  Now darkening of the vision in each eye.  Recommend consideration of cataract evaluation

## 2022-05-15 NOTE — Progress Notes (Signed)
Internal Medicine Clinic Attending  I saw and evaluated the patient.  I personally confirmed the key portions of the history and exam documented by the resident  and I reviewed pertinent patient test results.  The assessment, diagnosis, and plan were formulated together and I agree with the documentation in the resident's note.  

## 2022-05-16 ENCOUNTER — Other Ambulatory Visit: Payer: Self-pay

## 2022-05-24 ENCOUNTER — Encounter: Payer: Self-pay | Admitting: Dietician

## 2022-05-24 ENCOUNTER — Other Ambulatory Visit: Payer: Self-pay | Admitting: Student

## 2022-05-24 DIAGNOSIS — E785 Hyperlipidemia, unspecified: Secondary | ICD-10-CM

## 2022-05-31 ENCOUNTER — Telehealth: Payer: Self-pay | Admitting: Internal Medicine

## 2022-05-31 NOTE — Telephone Encounter (Signed)
Dr. Annamaria Boots, please advise on med refill.  Allergies  Allergen Reactions   Shrimp [Shellfish Allergy] Anaphylaxis   Banana Other (See Comments)    Pt gags   Watermelon Flavor Nausea And Vomiting   Almond Oil Itching    Roof of mouth itches   Other Itching    Grapes cause itching   Peanut-Containing Drug Products Itching and Cough   Sulfa Antibiotics Itching     Current Outpatient Medications:    Accu-Chek Softclix Lancets lancets, TEST UP TO 4 TIMES A DAY, Disp: 100 each, Rfl: 2   amLODipine (NORVASC) 10 MG tablet, Take 1 tablet (10 mg total) by mouth daily., Disp: 30 tablet, Rfl: 11   amphetamine-dextroamphetamine (ADDERALL XR) 30 MG 24 hr capsule, Take 1 capsule (30 mg total) by mouth daily as needed (focus). 1 daily as needed, Disp: 30 capsule, Rfl: 0   aspirin 81 MG EC tablet, Take 1 tablet (81 mg total) by mouth daily. Swallow whole., Disp: 90 tablet, Rfl: 1   atorvastatin (LIPITOR) 80 MG tablet, TAKE 1 TABLET BY MOUTH EVERY DAY, Disp: 90 tablet, Rfl: 3   blood glucose meter kit and supplies KIT, Dispense based on patient and insurance preference. Use up to four times daily as directed., Disp: 1 each, Rfl: 0   Blood Pressure Monitoring (BLOOD PRESSURE MONITOR/L CUFF) MISC, 1 Units by Does not apply route daily., Disp: 1 each, Rfl: 0   cetirizine (ZYRTEC ALLERGY) 10 MG tablet, Take 1 tablet (10 mg total) by mouth daily., Disp: 30 tablet, Rfl: 2   ELIQUIS 5 MG TABS tablet, TAKE 1 TABLET BY MOUTH TWICE A DAY, Disp: 60 tablet, Rfl: 5   empagliflozin (JARDIANCE) 25 MG TABS tablet, Take 1 tablet (25 mg total) by mouth daily., Disp: 90 tablet, Rfl: 2   flecainide (TAMBOCOR) 100 MG tablet, TAKE 1 TABLET BY MOUTH TWICE A DAY, Disp: 60 tablet, Rfl: 7   fluticasone (FLONASE) 50 MCG/ACT nasal spray, Place 1 spray into both nostrils daily., Disp: 15.8 mL, Rfl: 2   glucose blood (ACCU-CHEK GUIDE) test strip, Please use strips to test morning fasting sugars and one hour after each meal, total 4x  daily, Disp: 200 strip, Rfl: 5   metFORMIN (GLUCOPHAGE) 1000 MG tablet, Take 1 tablet (1,000 mg total) by mouth 2 (two) times daily with a meal., Disp: 120 tablet, Rfl: 3   metoprolol succinate (TOPROL-XL) 100 MG 24 hr tablet, TAKE 1 TABLET BY MOUTH DAILY. TAKE WITH OR IMMEDIATELY FOLLOWING A MEAL., Disp: 90 tablet, Rfl: 3   nicotine (NICODERM CQ - DOSED IN MG/24 HOURS) 21 mg/24hr patch, PLACE 1 PATCH (21 MG TOTAL) ONTO THE SKIN DAILY., Disp: 28 patch, Rfl: 1   olmesartan (BENICAR) 20 MG tablet, Take 1 tablet (20 mg total) by mouth daily., Disp: 30 tablet, Rfl: 1   pantoprazole (PROTONIX) 40 MG tablet, Take 1 tablet (40 mg total) by mouth daily., Disp: 90 tablet, Rfl: 3   patiromer (VELTASSA) 8.4 g packet, Take 1 packet (8.4 g total) by mouth daily., Disp: 30 each, Rfl: 3   polyethylene glycol (MIRALAX) 17 g packet, Take 17 g by mouth daily., Disp: 14 each, Rfl: 0

## 2022-06-01 MED ORDER — AMPHETAMINE-DEXTROAMPHET ER 30 MG PO CP24: 30.0000 mg | ORAL_CAPSULE | Freq: Every day | ORAL | 0 refills | Status: DC | PRN

## 2022-06-01 NOTE — Telephone Encounter (Signed)
Adderall refilled

## 2022-06-03 ENCOUNTER — Other Ambulatory Visit: Payer: Self-pay | Admitting: Student

## 2022-06-03 DIAGNOSIS — E1165 Type 2 diabetes mellitus with hyperglycemia: Secondary | ICD-10-CM

## 2022-06-05 ENCOUNTER — Telehealth: Payer: Self-pay

## 2022-06-05 NOTE — Telephone Encounter (Signed)
Pt is requesting a call back .Marland Kitchen He stated that at his last Appt his med was changed  but he was  never  was able to get  it from the pharmacy ... Pt is also concern about his potassium ... Since he never got the med

## 2022-06-05 NOTE — Telephone Encounter (Signed)
Talked to pt who stated he was unable to get Patiromer Lelon Perla) which was orderd on 7/3. Durwin Glaze has done a PA on this med 7/18- unable to find pt.;suggested pt calling the insurance co to see if he's still covered. I called pt back. I asked pt to call the number on the back of his ins card to be sure he's covered and to call us back one this is done.

## 2022-06-06 ENCOUNTER — Telehealth: Payer: Self-pay

## 2022-06-06 ENCOUNTER — Other Ambulatory Visit: Payer: Self-pay | Admitting: Student

## 2022-06-06 DIAGNOSIS — N1831 Chronic kidney disease, stage 3a: Secondary | ICD-10-CM

## 2022-06-06 DIAGNOSIS — E875 Hyperkalemia: Secondary | ICD-10-CM

## 2022-06-06 NOTE — Telephone Encounter (Signed)
PA for pt [facility] ) came through on cover my meds was submitted .Marland Kitchen But a message popped up cant find patient .Marland Kitchen        AS FOLLOWS :     Patient Savings Program: Click Here. ? There was an error with your request  ? Cannot find matching patient with Name and Date of Birth provided. For additional information, please contact the phone number on the back of the member prescription ID card.

## 2022-06-07 ENCOUNTER — Other Ambulatory Visit: Payer: 59

## 2022-06-07 ENCOUNTER — Telehealth: Payer: Self-pay | Admitting: *Deleted

## 2022-06-07 DIAGNOSIS — E875 Hyperkalemia: Secondary | ICD-10-CM

## 2022-06-07 DIAGNOSIS — N1831 Chronic kidney disease, stage 3a: Secondary | ICD-10-CM

## 2022-06-07 NOTE — Telephone Encounter (Signed)
Patient is scheduled to come in on Monday Aug.28 @8 :30

## 2022-06-07 NOTE — Progress Notes (Signed)
HPI  male smoker followed for management of OSA and narcolepsy with cataplexy, complicated by allergic rhinitis, atrial fibrillation, dCHF, DM, HTN NPSG 11/23/00- AHI 20 per hour. Hypnagogic hallucination associated with dreaming. 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 Vivid dreams as soon as he falls asleep Multiple Sleep Latency Test 10/30/2012-pathologic daytime hypersomnia, nonspecific, compatible with idiopathic hypersomnia or narcolepsy. Mean latency 0.9 minutes with one sleep onset REM event CT chest 12/05/19- mediastinal and bilateral adenopathy, interstitial prominence, ASCVD NPSG 05/03/21- AHI 29.6/ hr, desaturation to 81%, CPAP to 16 (O2 sat on CPAP 16 was 94%), body weight  306 lbs   Suggest CPAP auto 10-20  =============================================================   12/08/21-  51 year old male Smoker (14 pkyrs)  followed for management of OSA and narcolepsy with cataplexy,, Mediastinal / Hilar Adenopathy, ILD,  complicated by allergic rhinitis, atrial fibrillation/Eliquis flecainide/ Xarelto, dCHF, Diastolic Dysfunction, CAD, CVA, DM2, HTN, Pancreatitis, Obesity, Tobacco use,  Covid vax- 3 Phizer - Adderall XR 30 mg, 1 daily CPAP auto 4-20/ [facility]            AirSense 10 AutoSet Download-compliance 7%, AHI 0.3/ hr Body weight today-309 lbs Covid vax-3 Phizer Flu vax-had ED visit 12/12- choked drinking water -----Patient feels like he is doing good, no concerns.  He says he has not been using CPAP regularly because his nose gets stopped up.  Has full facemask.  Previously used Flonase and I recommended he start back.  He does expect increased nasal congestion with spring pollen season.  We reviewed CPAP goals and purpose again. He has not had new neurologic events.  Tends to strangle easily drinking water and this happens most often while watching TV.  He has worked with Human resources officer and has been told to "pay attention". Still smoking against advice with no effort to  stop.  Reminded of importance. CXR 09/26/21- IMPRESSION: Low lung volumes with streaky interstitial opacities in the bilateral lung bases. Findings may be related to atelectasis, infection, or aspiration.  06/09/22-  51 year old male Smoker (14 pkyrs)  followed for management of OSA and narcolepsy with cataplexy,, Mediastinal / Hilar Adenopathy, ILD,  complicated by allergic rhinitis, atrial fibrillation/Eliquis flecainide/ Xarelto, dCHF, Diastolic Dysfunction, CAD, CVA, DM2, HTN, Pancreatitis, Obesity, Tobacco use,  Covid vax- 3 Phizer - Adderall XR 30 mg, 1 daily CPAP auto 4-20/ [facility]            AirSense 10 AutoSet Download-compliance 30%, AHI 0.6/hr Body weight today-282 lbs Covid vax-3 Phizer Now reports smoking 1 PPW. Admits to claustrophobia interfering with his ability to comfortably wear  CPAP mask.  Discussed opportunity for mask fitting. He is concerned about renal function and reports potassium was elevated.  Asks about referral and I agreed to refer to nephrology.  ROS-see HPI  + = positive Constitutional:   No-   weight loss, night sweats, fevers, +chills,  +fatigue, lassitude. HEENT:   No-  headaches, difficulty swallowing, tooth/dental problems, sore throat,       No-  sneezing, itching, ear ache, + nasal congestion, post nasal drip,  CV:  No-   chest pain, orthopnea, PND, swelling in lower extremities, anasarca, dizziness, palpitations Resp: + shortness of breath with exertion or at rest.              No-   productive cough,  No non-productive cough,  No- coughing up of blood.              No-   change in color of mucus.  No- wheezing.  Skin: No-   rash or lesions. GI:  No-   heartburn, indigestion, abdominal pain, nausea, vomiting,  GU:  MS:  No-   joint pain or swelling.  Neuro-     +HPI Psych:  No- change in mood or affect. No depression or anxiety.  No memory loss.  OBJ- Physical Exam   General- Alert/ calm, Oriented, Affect-appropriate, Distress- none acute,  +morbidly obese,   Skin- rash-none, lesions- none, excoriation- none Lymphadenopathy- none Head- atraumatic            Eyes- Gross vision intact, PERRLA, conjunctivae and secretions clear            Ears- Hearing, canals-normal            Nose- rhinitis/ turbinate edema, no-Septal dev, mucus, polyps, erosion, perforation             Throat- Mallampati III , mucosa clear , drainage- none, tonsils- atrophic Neck- flexible , trachea midline, no stridor , thyroid nl, carotid no bruit Chest - symmetrical excursion , unlabored           Heart/CV- RRR today, no murmur , no gallop  , no rub, nl s1 s2                           - JVD- none , edema- none, stasis changes- none, varices- none           Lung- clear to P&A, wheeze- none, cough- none , dullness-none, rub- none           Chest wall-  Abd-  Br/ Gen/ Rectal- Not done, not indicated Extrem- no edema Neuro- alert, nonfocal

## 2022-06-07 NOTE — Telephone Encounter (Signed)
Call to [facility] for PA for Veltassa.  Information provided. [id number].  24-72 hour turn around.  Asked for rush decision.  Can call [phone].

## 2022-06-08 ENCOUNTER — Telehealth: Payer: Self-pay

## 2022-06-08 ENCOUNTER — Ambulatory Visit: Payer: 59 | Admitting: Internal Medicine

## 2022-06-08 LAB — BMP8+ANION GAP
Anion Gap: 19 mmol/L — ABNORMAL HIGH (ref 10.0–18.0)
BUN/Creatinine Ratio: 26 — ABNORMAL HIGH (ref 9–20)
BUN: 42 mg/dL — ABNORMAL HIGH (ref 6–24)
CO2: 14 mmol/L — ABNORMAL LOW (ref 20–29)
Calcium: 9.2 mg/dL (ref 8.7–10.2)
Chloride: 107 mmol/L — ABNORMAL HIGH (ref 96–106)
Creatinine, Ser: 1.63 mg/dL — ABNORMAL HIGH (ref 0.76–1.27)
Glucose: 161 mg/dL — ABNORMAL HIGH (ref 70–99)
Potassium: 4.5 mmol/L (ref 3.5–5.2)
Sodium: 140 mmol/L (ref 134–144)
eGFR: 51 mL/min/{1.73_m2} — ABNORMAL LOW (ref >59)

## 2022-06-08 NOTE — Telephone Encounter (Signed)
Requesting lab results

## 2022-06-08 NOTE — Telephone Encounter (Signed)
Patient called regarding lab results please return call.

## 2022-06-09 ENCOUNTER — Encounter: Payer: Self-pay | Admitting: Internal Medicine

## 2022-06-09 ENCOUNTER — Ambulatory Visit: Payer: 59 | Admitting: Internal Medicine

## 2022-06-09 VITALS — BP 102/78 | HR 67 | Ht 69.0 in | Wt 282.0 lb

## 2022-06-09 DIAGNOSIS — G4733 Obstructive sleep apnea (adult) (pediatric): Secondary | ICD-10-CM

## 2022-06-09 DIAGNOSIS — N2589 Other disorders resulting from impaired renal tubular function: Secondary | ICD-10-CM

## 2022-06-09 DIAGNOSIS — N289 Disorder of kidney and ureter, unspecified: Secondary | ICD-10-CM | POA: Diagnosis not present

## 2022-06-09 NOTE — Patient Instructions (Signed)
Order- referral to sleep center for mask fitting desensitization  Order referral to nephrology- Renal insufficiency  Please try to use your CPAP all night every night. You need to take care of the body you have to live in- its the only one you will get.  Please try to stop smoking

## 2022-06-09 NOTE — Assessment & Plan Note (Signed)
Continue efforts to improve his compliance. Plan-refer for mask fitting/desensitization

## 2022-06-09 NOTE — Assessment & Plan Note (Signed)
He asks referral to nephrology.

## 2022-06-12 ENCOUNTER — Other Ambulatory Visit: Payer: 59

## 2022-06-13 ENCOUNTER — Encounter: Payer: Self-pay | Admitting: Podiatry

## 2022-06-13 ENCOUNTER — Other Ambulatory Visit (HOSPITAL_BASED_OUTPATIENT_CLINIC_OR_DEPARTMENT_OTHER): Payer: 59 | Admitting: Internal Medicine

## 2022-06-13 ENCOUNTER — Ambulatory Visit: Payer: 59 | Admitting: Podiatry

## 2022-06-13 DIAGNOSIS — E118 Type 2 diabetes mellitus with unspecified complications: Secondary | ICD-10-CM | POA: Diagnosis not present

## 2022-06-13 DIAGNOSIS — G608 Other hereditary and idiopathic neuropathies: Secondary | ICD-10-CM

## 2022-06-13 DIAGNOSIS — B351 Tinea unguium: Secondary | ICD-10-CM | POA: Diagnosis not present

## 2022-06-13 DIAGNOSIS — M79609 Pain in unspecified limb: Secondary | ICD-10-CM | POA: Diagnosis not present

## 2022-06-13 NOTE — Progress Notes (Signed)
This patient returns to my office for at risk foot care.  This patient requires this care by a professional since this patient will be at risk due to having diabetes  coagulation defect and kidney disease.  Patient is prescribed eliquis.  This patient is unable t  o cut nails himself since the patient cannot reach his nails.These nails are painful walking and wearing shoes.  This patient has not been seen in over one year.This patient presents for at risk foot care today.  General Appearance  Alert, conversant and in no acute stress.  Vascular  Dorsalis pedis and posterior tibial  pulses are palpable  bilaterally.  Capillary return is within normal limits  bilaterally. Temperature is within normal limits  bilaterally.  Neurologic  Senn-Weinstein monofilament wire test within normal limits  bilaterally. Muscle power within normal limits bilaterally.  Nails Thick disfigured discolored nails with subungual debris  from hallux to fifth toes bilaterally. No evidence of bacterial infection or drainage bilaterally.  Orthopedic  No limitations of motion  feet .  No crepitus or effusions noted.  No bony pathology or digital deformities noted.  HAV  right foot.  Pes planus  B/L.  Skin  normotropic skin with no porokeratosis noted bilaterally.  No signs of infections or ulcers noted.     Onychomycosis  Pain in right toes  Pain in left toes  Consent was obtained for treatment procedures.   Mechanical debridement of nails 1-5  bilaterally performed with a nail nipper.  Filed with dremel without incident.    Return office visit    6 months                  Told patient to return for periodic foot care and evaluation due to potential at risk complications.   Helane Gunther DPM

## 2022-06-16 ENCOUNTER — Telehealth: Payer: Self-pay

## 2022-06-16 NOTE — Telephone Encounter (Signed)
Prior Authorization for patient (veltassa) came through on cover my meds patient not found in cover my meds portal. Called [facility] and requested a Prior Authorzation form. Received form and placed in blue team box.

## 2022-06-30 ENCOUNTER — Telehealth: Payer: Self-pay | Admitting: Internal Medicine

## 2022-06-30 NOTE — Telephone Encounter (Signed)
ATC LVMTCB x 1  

## 2022-07-03 ENCOUNTER — Telehealth: Payer: Self-pay

## 2022-07-03 MED ORDER — AMPHETAMINE-DEXTROAMPHET ER 30 MG PO CP24: 30.0000 mg | ORAL_CAPSULE | Freq: Every day | ORAL | 0 refills | Status: DC | PRN

## 2022-07-03 NOTE — Telephone Encounter (Signed)
Called and spoke with patient. He stated that he is in need a refill on his Adderall XR 30mg . [facility] [address]   Last refill was on 06/01/22 for 30 capsules. Last OV was on 06/09/22 and he was advised to follow up in 6 months.   Dr. Annamaria Boots, please advise if you are ok with this refill. Thanks!

## 2022-07-03 NOTE — Telephone Encounter (Signed)
Adderall refilled

## 2022-07-03 NOTE — Telephone Encounter (Signed)
Called and spoke with patient. He is aware that the RX has been sent in for him.   Nothing further needed at time of call.

## 2022-07-03 NOTE — Telephone Encounter (Addendum)
Prior Authorization for patient([name]) came through on cover my meds was submitted with last office notes awaiting approval or denial  [facility] is reviewing your PA request. Typically an electronic response will be received within 24-72 hours. To check for an update later, open this request from your dashboard.

## 2022-07-05 ENCOUNTER — Other Ambulatory Visit: Payer: Self-pay | Admitting: Internal Medicine

## 2022-07-05 DIAGNOSIS — E875 Hyperkalemia: Secondary | ICD-10-CM

## 2022-07-05 NOTE — Telephone Encounter (Addendum)
Decision:Denied Trevor Iha Key: [id number] - PA Case ID: [id number] - Rx #: [id number] Need help? Call us at [phone] Deniedtoday Request Reference Number: [id number]. VELTASSA POW 8.4GM is denied for not meeting the prior authorization requirement(s). Details of this decision are in the notice attached below or have been faxed to you. Drug Veltassa 8.4GM packets Form [facility] Electronic Prior Authorization Form (2017 NCPDP) Original Claim Info 90

## 2022-07-08 ENCOUNTER — Other Ambulatory Visit: Payer: Self-pay | Admitting: Internal Medicine

## 2022-07-13 NOTE — Telephone Encounter (Signed)
Received fax from [facility] regarding denial, letter has been placed in box to be reviewed

## 2022-08-01 ENCOUNTER — Telehealth: Payer: Self-pay | Admitting: Internal Medicine

## 2022-08-01 ENCOUNTER — Encounter: Payer: Self-pay | Admitting: Internal Medicine

## 2022-08-01 NOTE — Telephone Encounter (Signed)
Patient called to request a refill for his Adderall medication.  [facility] stated [facility] [address].

## 2022-08-01 NOTE — Telephone Encounter (Signed)
Called patient but he did not answer. Left message for him to call us back.  

## 2022-08-03 MED ORDER — AMPHETAMINE-DEXTROAMPHET ER 30 MG PO CP24: 30.0000 mg | ORAL_CAPSULE | Freq: Every day | ORAL | 0 refills | Status: DC | PRN

## 2022-08-03 NOTE — Telephone Encounter (Signed)
Patient is returning phone call. Patient has new number. Phone number is [phone].

## 2022-08-03 NOTE — Telephone Encounter (Signed)
Dr. Annamaria Boots, please advise if you ok with sending in his Adderall refill. Thanks!

## 2022-08-03 NOTE — Telephone Encounter (Signed)
ATC patient. LVMTCB. 

## 2022-08-03 NOTE — Telephone Encounter (Signed)
Adderall refilled

## 2022-08-03 NOTE — Telephone Encounter (Signed)
Patient returned call 08/03/2022 for adderall refill. Please call when script is in

## 2022-08-04 NOTE — Telephone Encounter (Signed)
Called and spoke with patient. He verbalized understanding. ? ?Nothing further needed at time of call.  ?

## 2022-08-08 ENCOUNTER — Encounter: Payer: 59 | Admitting: Student

## 2022-08-09 ENCOUNTER — Encounter: Payer: 59 | Admitting: Student

## 2022-08-10 ENCOUNTER — Encounter: Payer: Self-pay | Admitting: Student

## 2022-09-03 ENCOUNTER — Other Ambulatory Visit: Payer: Self-pay | Admitting: Student

## 2022-09-03 DIAGNOSIS — K219 Gastro-esophageal reflux disease without esophagitis: Secondary | ICD-10-CM

## 2022-09-04 NOTE — Telephone Encounter (Signed)
Next appt scheduled 09/06/22 with Dr Geraldo Pitter.

## 2022-09-06 ENCOUNTER — Ambulatory Visit (INDEPENDENT_AMBULATORY_CARE_PROVIDER_SITE_OTHER): Payer: 59 | Admitting: Student

## 2022-09-06 ENCOUNTER — Encounter: Payer: Self-pay | Admitting: Student

## 2022-09-06 VITALS — BP 143/85 | HR 80 | Temp 98.0°F | Ht 69.0 in | Wt 283.3 lb

## 2022-09-06 DIAGNOSIS — N529 Male erectile dysfunction, unspecified: Secondary | ICD-10-CM

## 2022-09-06 DIAGNOSIS — E1122 Type 2 diabetes mellitus with diabetic chronic kidney disease: Secondary | ICD-10-CM

## 2022-09-06 DIAGNOSIS — R3915 Urgency of urination: Secondary | ICD-10-CM

## 2022-09-06 DIAGNOSIS — E875 Hyperkalemia: Secondary | ICD-10-CM

## 2022-09-06 DIAGNOSIS — R399 Unspecified symptoms and signs involving the genitourinary system: Secondary | ICD-10-CM

## 2022-09-06 DIAGNOSIS — N1831 Chronic kidney disease, stage 3a: Secondary | ICD-10-CM | POA: Diagnosis not present

## 2022-09-06 DIAGNOSIS — I129 Hypertensive chronic kidney disease with stage 1 through stage 4 chronic kidney disease, or unspecified chronic kidney disease: Secondary | ICD-10-CM | POA: Diagnosis not present

## 2022-09-06 DIAGNOSIS — I480 Paroxysmal atrial fibrillation: Secondary | ICD-10-CM

## 2022-09-06 DIAGNOSIS — I10 Essential (primary) hypertension: Secondary | ICD-10-CM

## 2022-09-06 DIAGNOSIS — E118 Type 2 diabetes mellitus with unspecified complications: Secondary | ICD-10-CM

## 2022-09-06 DIAGNOSIS — F17210 Nicotine dependence, cigarettes, uncomplicated: Secondary | ICD-10-CM

## 2022-09-06 DIAGNOSIS — Z7984 Long term (current) use of oral hypoglycemic drugs: Secondary | ICD-10-CM

## 2022-09-06 DIAGNOSIS — N2589 Other disorders resulting from impaired renal tubular function: Secondary | ICD-10-CM

## 2022-09-06 LAB — POCT GLYCOSYLATED HEMOGLOBIN (HGB A1C): Hemoglobin A1C: 8.2 % — AB (ref 4.0–5.6)

## 2022-09-06 LAB — GLUCOSE, CAPILLARY: Glucose-Capillary: 189 mg/dL — ABNORMAL HIGH (ref 70–99)

## 2022-09-06 MED ORDER — OLMESARTAN MEDOXOMIL 20 MG PO TABS: 20.0000 mg | ORAL_TABLET | Freq: Every day | ORAL | 1 refills | Status: DC

## 2022-09-06 MED ORDER — APIXABAN 5 MG PO TABS: 5.0000 mg | ORAL_TABLET | Freq: Two times a day (BID) | ORAL | 5 refills | Status: DC

## 2022-09-06 MED ORDER — TIRZEPATIDE 5 MG/0.5ML ~~LOC~~ SOAJ: 5.0000 mg | SUBCUTANEOUS | 0 refills | Status: DC

## 2022-09-06 MED ORDER — FLECAINIDE ACETATE 100 MG PO TABS: 100.0000 mg | ORAL_TABLET | Freq: Two times a day (BID) | ORAL | 0 refills | Status: DC

## 2022-09-06 NOTE — Patient Instructions (Signed)
Thank you, Mr.Brenyn Willow Ora., for allowing [place] to provide your care today. Today we discussed . . .  > Erectile dysfunction and lower urinary tract symptoms       -As discussed we think with your history of strokes and family history of prostate cancer that sending you to urology is a good neck step.  We will also test your urine today see if there is any indication of infection as well as send a PSA to check the prostate.  If this is elevated it could be due to just a large prostate that is causing her symptoms that is not cancer or it can indicate a cancer as well.  Due to this as above we will send you to urology to get further evaluated. > Type 2 diabetes       -Your A1c today had risen a little bit to 8.2 from 7.6.  We refilled your Greggory Keen today and recommend that you continue taking it as you were along with your empagliflozin and metformin.  Please also decrease or cut out the amount of sugary sodas you are drinking.  We will have you back in 3 months to recheck these. > Nighttime dizziness       -Please continue to use night lights to help decrease the risk of falling at night.  If you have any worsening symptoms or concerns please do not hesitate to call our office and we can get you in sooner to reevaluate you.   I have ordered the following labs for you:   Lab Orders         Basic metabolic panel         Glucose, capillary         Urinalysis, Reflex Microscopic         PSA         POC Hbg A1C       Tests ordered today:  None   Referrals ordered today:    Referral Orders         Ambulatory referral to Urology       I have ordered the following medication/changed the following medications:   Stop the following medications: Medications Discontinued During This Encounter  Medication Reason   ELIQUIS 5 MG TABS tablet Reorder   olmesartan (BENICAR) 20 MG tablet Reorder   flecainide (TAMBOCOR) 100 MG tablet Reorder     Start the following  medications: Meds ordered this encounter  Medications   tirzepatide (MOUNJARO) 5 MG/0.5ML Pen    Sig: Inject 5 mg into the skin once a week.    Dispense:  6 mL    Refill:  0   apixaban (ELIQUIS) 5 MG TABS tablet    Sig: Take 1 tablet (5 mg total) by mouth 2 (two) times daily.    Dispense:  60 tablet    Refill:  5   flecainide (TAMBOCOR) 100 MG tablet    Sig: Take 1 tablet (100 mg total) by mouth 2 (two) times daily. PLEASE CONTACT THE OFFICE TO SCHEDULE AN APPT FOR ADDITIONAL REFILLS    Dispense:  30 tablet    Refill:  0   olmesartan (BENICAR) 20 MG tablet    Sig: Take 1 tablet (20 mg total) by mouth daily.    Dispense:  30 tablet    Refill:  1      Follow up: 3 months    Remember:     Should you have any questions or concerns please call the internal  medicine clinic at [phone].     Johny Blamer, [place]

## 2022-09-06 NOTE — Progress Notes (Signed)
   CC: LUTS  HPI:  [name] is a 51 y.o. male with history as below who presents for routine follow-up visit and with complaints of erectile dysfunction and lower urinary tract symptoms.  Please see encounters tab for problem-based charting.  Past Medical History:  Diagnosis Date   Acute pancreatitis 01/22/2019   Diabetes mellitus    GERD (gastroesophageal reflux disease)    History of alcohol abuse    History of cocaine use    History of echocardiogram    a. Echo 4/14: Moderate LVH, vigorous LVEF, EF 65-70%, normal wall motion, grade 2 diastolic dysfunction, mildly dilated aortic root and ascending aorta, ascending aorta 40 mm, aortic root 38 mm, mild LAE   Hx of cardiovascular stress test    a. GXT 5/14: No ischemic changes  //  b. ETT-Myoview 3/16:  Low risk, no ischemia, EF 58%   Hyperlipidemia    Hypertension    Morbid obesity (HCC)    Paroxysmal atrial fibrillation (HCC)    Occurring in 2008, with several recurrence since then (including in the setting of + cocaine on UDS).   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   Review of Systems:   A comprehensive review of systems was negative except for: Urinary urgency, urinary frequency, postvoid dribbling, incomplete emptying of bladder, erectile dysfunction, chronic/stable right leg and arm weakness   Physical Exam:  Vitals:   09/06/22 0906  BP: (!) 143/85  Pulse: 80  Temp: 98 F (36.7 C)  TempSrc: Oral  SpO2: 97%  Weight: 283 lb 4.8 oz (128.5 kg)  Height: 5\' 9"  (1.753 m)   Constitutional: Obese middle-age male sitting in his chair. In no acute distress. HENT: Normocephalic, atraumatic,  Eyes: Sclera non-icteric, EOM intact Cardio:Regular rate and rhythm. No murmurs, rubs, or gallops. 2+ bilateral radial pulses. Pulm:Clear to auscultation bilaterally. Normal work of breathing on room air. Abdomen: Soft, non-tender,  non-distended, positive bowel sounds. for extremity edema. Skin:Warm and dry. Neuro:Alert and oriented x3.  4/5 strength in right hip flexion.  Otherwise 5/5 strength in bilateral upper and lower extremities. Psych:Pleasant mood and affect.   Assessment & Plan:   See Encounters Tab for problem based charting.  Patient seen with Dr. KGU:[id number]

## 2022-09-07 LAB — BASIC METABOLIC PANEL
BUN/Creatinine Ratio: 17 (ref 9–20)
BUN: 30 mg/dL — ABNORMAL HIGH (ref 6–24)
CO2: 22 mmol/L (ref 20–29)
Calcium: 9.4 mg/dL (ref 8.7–10.2)
Chloride: 104 mmol/L (ref 96–106)
Creatinine, Ser: 1.73 mg/dL — ABNORMAL HIGH (ref 0.76–1.27)
Glucose: 179 mg/dL — ABNORMAL HIGH (ref 70–99)
Potassium: 4.9 mmol/L (ref 3.5–5.2)
Sodium: 141 mmol/L (ref 134–144)
eGFR: 47 mL/min/{1.73_m2} — ABNORMAL LOW (ref >59)

## 2022-09-07 LAB — PSA: Prostate Specific Ag, Serum: 0.3 ng/mL (ref 0.0–4.0)

## 2022-09-08 DIAGNOSIS — R399 Unspecified symptoms and signs involving the genitourinary system: Secondary | ICD-10-CM | POA: Insufficient documentation

## 2022-09-08 DIAGNOSIS — N529 Male erectile dysfunction, unspecified: Secondary | ICD-10-CM | POA: Insufficient documentation

## 2022-09-08 NOTE — Assessment & Plan Note (Signed)
A1c has risen from 7.6-8.2.  Patient notes that he has been drinking more sodas than he was before his last A1c.  He also ran out of his Greggory Keen about a month ago.  We discussed decreasing the amount of sodas that he is drinking and getting back on the [facility].  He agrees with this plan.  We will not make any changes to his therapy. - Continue Jardiance 25 mg daily and metformin 1000 mg twice daily - Refill Mounjaro 5 mg weekly - Repeat A1c in 3 months

## 2022-09-08 NOTE — Assessment & Plan Note (Signed)
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  This is also been associated with lower urinary tract symptoms which are described elsewhere.  He notes decreased or absent morning erections, inability to obtain erection, and inability to maintain erection adequate for intercourse.  He has not tried any therapies for these issues.  He is not seen urology in the past. - Referral to urology for further evaluation of this and his lower urinary tract symptoms

## 2022-09-08 NOTE — Assessment & Plan Note (Signed)
Patient describes stable and bothersome lower urinary tract symptoms that include urinary urgency, frequency, postvoid dribbling and incomplete emptying.  He has had these since his stroke in June 2023.  He denies any worsening but had thought they might improve as he recovered from his stroke.  He does have a family history of prostate cancer with his uncle passing away from this.  He has not had prior evaluation of his prostate. - PSA today and referral to urology for further evaluation

## 2022-09-11 ENCOUNTER — Encounter (INDEPENDENT_AMBULATORY_CARE_PROVIDER_SITE_OTHER): Payer: 59 | Admitting: Ophthalmology

## 2022-09-11 ENCOUNTER — Telehealth: Payer: Self-pay | Admitting: Internal Medicine

## 2022-09-11 MED ORDER — AMPHETAMINE-DEXTROAMPHET ER 30 MG PO CP24: 30.0000 mg | ORAL_CAPSULE | Freq: Every day | ORAL | 0 refills | Status: DC | PRN

## 2022-09-11 NOTE — Telephone Encounter (Signed)
Patient is requesting a refill on his Adderall XR 30mg . Last refill was on 08/03/22 for 30 capsules. Last OV was on 06/09/22. Next OV is scheduled for 12/08/21. [facility] [address]   Dr. Phelps Dodge, please advise if you are ok with this refill. Thanks!

## 2022-09-11 NOTE — Telephone Encounter (Signed)
Called and spoke with patient. He is aware that the Adderall refill has been sent.   Nothing further needed at time of call.

## 2022-09-11 NOTE — Progress Notes (Signed)
Called the patient and discussed his lab results of his BMP and PSA.  His renal function slightly decreased from a GFR of 51-47 and his PSA was 0.3.  No changes in management at this time.  He will continue to go to planned referral to urology.  A1c and capillary glucose were discussed during the visit.  All questions were answered.

## 2022-09-11 NOTE — Telephone Encounter (Signed)
Adderall refilled

## 2022-09-13 NOTE — Progress Notes (Signed)
Internal Medicine Clinic Attending  I saw and evaluated the patient.  I personally confirmed the key portions of the history and exam documented by the resident  and I reviewed pertinent patient test results.  The assessment, diagnosis, and plan were formulated together and I agree with the documentation in the resident's note.  

## 2022-09-22 ENCOUNTER — Ambulatory Visit: Payer: 59 | Admitting: Student

## 2022-10-13 ENCOUNTER — Telehealth: Payer: Self-pay | Admitting: Internal Medicine

## 2022-10-13 MED ORDER — AMPHETAMINE-DEXTROAMPHET ER 30 MG PO CP24: 30.0000 mg | ORAL_CAPSULE | Freq: Every day | ORAL | 0 refills | Status: DC | PRN

## 2022-10-13 NOTE — Telephone Encounter (Signed)
Patient called to request a refill on his Adderall medication.  Please advise.

## 2022-10-13 NOTE — Telephone Encounter (Signed)
Called patient and he states that he is needing his Adderall XR 30mg  refilled  [facility]  Please advise sir

## 2022-10-13 NOTE — Telephone Encounter (Signed)
Called and spoke to patient and let him know that his Adderall was refilled by Dr Maple Hudson. Patient verbalized understanding. Nothing further needed

## 2022-10-13 NOTE — Telephone Encounter (Signed)
Adderall refilled

## 2022-11-06 NOTE — Progress Notes (Deleted)
PCP:  [name], MD Primary Cardiologist: Larae Grooms, MD Electrophysiologist: Thompson Grayer, MD   [place]. is a 52 y.o. male seen today for Thompson Grayer, MD for {VISITTYPE:28148}  Past Medical History:  Diagnosis Date   Acute pancreatitis 01/22/2019   Diabetes mellitus    GERD (gastroesophageal reflux disease)    History of alcohol abuse    History of cocaine use    History of echocardiogram    a. Echo 4/14: Moderate LVH, vigorous LVEF, EF 65-70%, normal wall motion, grade 2 diastolic dysfunction, mildly dilated aortic root and ascending aorta, ascending aorta 40 mm, aortic root 38 mm, mild LAE   Hx of cardiovascular stress test    a. GXT 5/14: No ischemic changes  //  b. ETT-Myoview 3/16:  Low risk, no ischemia, EF 58%   Hyperlipidemia    Hypertension    Morbid obesity ([place])    Paroxysmal atrial fibrillation ([place])    Occurring in 2008, with several recurrence since then (including in the setting of + cocaine on UDS).   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    Current Outpatient Medications  Medication Instructions   Accu-Chek Softclix Lancets lancets TEST UP TO 4 TIMES A DAY   amLODipine (NORVASC) 10 mg, Oral, Daily   amphetamine-dextroamphetamine (ADDERALL XR) 30 MG 24 hr capsule 30 mg, Oral, Daily PRN, 1 daily as needed   apixaban (ELIQUIS) 5 mg, Oral, 2 times daily   aspirin EC 81 mg, Oral, Daily, Swallow whole.   atorvastatin (LIPITOR) 80 mg, Oral, Daily   blood glucose meter kit and supplies KIT Dispense based on patient and insurance preference. Use up to four times daily as directed.   Blood Pressure Monitoring (BLOOD PRESSURE MONITOR/L CUFF) MISC 1 Units, Does not apply, Daily   cetirizine (ZYRTEC ALLERGY) 10 mg, Oral, Daily   empagliflozin (JARDIANCE) 25 mg, Oral, Daily   flecainide (TAMBOCOR) 100 mg, Oral, 2 times daily, PLEASE CONTACT THE OFFICE TO  SCHEDULE AN APPT FOR ADDITIONAL REFILLS   fluticasone (FLONASE) 50 MCG/ACT nasal spray 1 spray, Each Nare, Daily   glucose blood (ACCU-CHEK GUIDE) test strip Please use strips to test morning fasting sugars and one hour after each meal, total 4x daily   metFORMIN (GLUCOPHAGE) 1000 MG tablet TAKE 1 TABLET (1,000 MG TOTAL) BY MOUTH TWICE A DAY WITH FOOD   metoprolol succinate (TOPROL-XL) 100 MG 24 hr tablet TAKE 1 TABLET BY MOUTH DAILY. TAKE WITH OR IMMEDIATELY FOLLOWING A MEAL.   nicotine (NICODERM CQ - DOSED IN MG/24 HOURS) 21 mg/24hr patch PLACE 1 PATCH (21 MG TOTAL) ONTO THE SKIN DAILY.   olmesartan (BENICAR) 20 mg, Oral, Daily   pantoprazole (PROTONIX) 40 mg, Oral, Daily   polyethylene glycol (MIRALAX) 17 g, Oral, Daily   tirzepatide (MOUNJARO) 5 mg, Subcutaneous, Weekly    Physical Exam: *** There were no vitals filed for this visit.  GEN- NAD. A&O x 3. Normal affect. HEENT: normocephalic, atraumatic Lungs- CTAB, Normal effort Heart- {EPRHYTHM:28826}, No M/G/R Extremities- {EDEMA LEVEL:28147::"No"} peripheral edema. no clubbing or cyanosis; Skin- warm and dry, no rash or lesion  EKG is ordered today. Personal review shows ***  Additional studies reviewed include: Previous EP notes.   {Select studies to display:26339}  Assessment and Plan:  1.  Paroxymal atrial fibrillation/ atach Well controlled post ablation.  EKG today shows *** Continue  flecainide 100 mg BID.  Continue toprol 50 mg daily.  Continue Eliquis for chads2vasc score of 4. Denies bleeding.  Labs ok 08/2022 Encouraged lifestyle modification.   2. Hypertensive cardiovascular disease Continue to follow on current medications.  TEE 05/2021 with normal EF and no PFO   3. OSA Encouraged nightly CPAP use   4. Morbid obesity There is no height or weight on file to calculate BMI.    5. Cryptogenic Stroke Continue Eliquis No clear indication for Loop at this time with h/o AF, No PFO on TEE     Follow up  with [id number] {EPFOLLOW UP:28173}  Shirley Friar, PA-C  11/06/22 12:30 PM

## 2022-11-09 ENCOUNTER — Ambulatory Visit: Payer: 59 | Admitting: Student

## 2022-11-09 ENCOUNTER — Other Ambulatory Visit: Payer: Self-pay | Admitting: Student

## 2022-11-09 DIAGNOSIS — G4733 Obstructive sleep apnea (adult) (pediatric): Secondary | ICD-10-CM

## 2022-11-09 DIAGNOSIS — I480 Paroxysmal atrial fibrillation: Secondary | ICD-10-CM

## 2022-11-09 DIAGNOSIS — I10 Essential (primary) hypertension: Secondary | ICD-10-CM

## 2022-11-13 ENCOUNTER — Telehealth: Payer: Self-pay | Admitting: Internal Medicine

## 2022-11-13 MED ORDER — AMPHETAMINE-DEXTROAMPHET ER 30 MG PO CP24: 30.0000 mg | ORAL_CAPSULE | Freq: Every day | ORAL | 0 refills | Status: DC | PRN

## 2022-11-13 NOTE — Telephone Encounter (Signed)
Adderall refill sent.

## 2022-11-13 NOTE — Telephone Encounter (Signed)
Spoke with patient. Advised of Adderall refill. Nothing further needed. 

## 2022-11-23 ENCOUNTER — Ambulatory Visit: Payer: 59 | Admitting: Student

## 2022-11-23 ENCOUNTER — Encounter (HOSPITAL_COMMUNITY): Payer: Self-pay | Admitting: *Deleted

## 2022-12-01 ENCOUNTER — Other Ambulatory Visit: Payer: Self-pay | Admitting: Student

## 2022-12-01 DIAGNOSIS — E1165 Type 2 diabetes mellitus with hyperglycemia: Secondary | ICD-10-CM

## 2022-12-06 ENCOUNTER — Encounter: Payer: 59 | Admitting: Student

## 2022-12-06 NOTE — Progress Notes (Deleted)
HPI  male smoker followed for management of OSA and narcolepsy with cataplexy, complicated by allergic rhinitis, atrial fibrillation, dCHF, DM, HTN NPSG 11/23/00- AHI 20 per hour. Hypnagogic hallucination associated with dreaming. 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 Vivid dreams as soon as he falls asleep Multiple Sleep Latency Test 10/30/2012-pathologic daytime hypersomnia, nonspecific, compatible with idiopathic hypersomnia or narcolepsy. Mean latency 0.9 minutes with one sleep onset REM event CT chest 12/05/19- mediastinal and bilateral adenopathy, interstitial prominence, ASCVD NPSG 05/03/21- AHI 29.6/ hr, desaturation to 81%, CPAP to 16 (O2 sat on CPAP 16 was 94%), body weight  306 lbs   Suggest CPAP auto 10-20  =============================================================   06/09/22-  52 year old male Smoker (14 pkyrs)  followed for management of OSA and narcolepsy with cataplexy,, Mediastinal / Hilar Adenopathy, ILD,  complicated by allergic rhinitis, atrial fibrillation/Eliquis flecainide/ Xarelto, dCHF, Diastolic Dysfunction, CAD, CVA, DM2, HTN, Pancreatitis, Obesity, Tobacco use,  Covid vax- 3 Phizer - Adderall XR 30 mg, 1 daily CPAP auto 4-20/ [facility]            AirSense 10 AutoSet Download-compliance 30%, AHI 0.6/hr Body weight today-282 lbs Covid vax-3 Phizer Now reports smoking 1 PPW. Admits to claustrophobia interfering with his ability to comfortably wear  CPAP mask.  Discussed opportunity for mask fitting. He is concerned about renal function and reports potassium was elevated.  Asks about referral and I agreed to refer to nephrology.  12/08/22- 52 year old male Smoker (14 pkyrs)  followed for management of OSA and Narcolepsy with cataplexy,, Mediastinal / Hilar Adenopathy, ILD,  complicated by allergic rhinitis, Atrial Fibrillation/Eliquis flecainide/ Xarelto, dCHF, Diastolic Dysfunction, CAD, CVA, DM2, HTN, Pancreatitis, Obesity, Tobacco use,  Covid vax- 3 Phizer -  Adderall XR 30 mg, 1 daily CPAP auto 4-20/ [facility]            AirSense 10 AutoSet Download-compliance  Body weight today- Covid vax-3 Phizer Flu vax-   ROS-see HPI  + = positive Constitutional:   No-   weight loss, night sweats, fevers, +chills,  +fatigue, lassitude. HEENT:   No-  headaches, difficulty swallowing, tooth/dental problems, sore throat,       No-  sneezing, itching, ear ache, + nasal congestion, post nasal drip,  CV:  No-   chest pain, orthopnea, PND, swelling in lower extremities, anasarca, dizziness, palpitations Resp: + shortness of breath with exertion or at rest.              No-   productive cough,  No non-productive cough,  No- coughing up of blood.              No-   change in color of mucus.  No- wheezing.   Skin: No-   rash or lesions. GI:  No-   heartburn, indigestion, abdominal pain, nausea, vomiting,  GU:  MS:  No-   joint pain or swelling.  Neuro-     +HPI Psych:  No- change in mood or affect. No depression or anxiety.  No memory loss.  OBJ- Physical Exam   General- Alert/ calm, Oriented, Affect-appropriate, Distress- none acute, +morbidly obese,   Skin- rash-none, lesions- none, excoriation- none Lymphadenopathy- none Head- atraumatic            Eyes- Gross vision intact, PERRLA, conjunctivae and secretions clear            Ears- Hearing, canals-normal            Nose- rhinitis/ turbinate edema, no-Septal dev, mucus, polyps, erosion, perforation  Throat- Mallampati III , mucosa clear , drainage- none, tonsils- atrophic Neck- flexible , trachea midline, no stridor , thyroid nl, carotid no bruit Chest - symmetrical excursion , unlabored           Heart/CV- RRR today, no murmur , no gallop  , no rub, nl s1 s2                           - JVD- none , edema- none, stasis changes- none, varices- none           Lung- clear to P&A, wheeze- none, cough- none , dullness-none, rub- none           Chest wall-  Abd-  Br/ Gen/ Rectal- Not done, not  indicated Extrem- no edema Neuro- alert, nonfocal

## 2022-12-07 NOTE — Progress Notes (Deleted)
PCP:  [name], MD Primary Cardiologist: Larae Grooms, MD Electrophysiologist: Thompson Grayer, MD (Inactive)   [name]. is a 52 y.o. male seen today for Thompson Grayer, MD (Inactive) for {VISITTYPE:28148}  Past Medical History:  Diagnosis Date   Acute pancreatitis 01/22/2019   Diabetes mellitus    GERD (gastroesophageal reflux disease)    History of alcohol abuse    History of cocaine use    History of echocardiogram    a. Echo 4/14: Moderate LVH, vigorous LVEF, EF 65-70%, normal wall motion, grade 2 diastolic dysfunction, mildly dilated aortic root and ascending aorta, ascending aorta 40 mm, aortic root 38 mm, mild LAE   Hx of cardiovascular stress test    a. GXT 5/14: No ischemic changes  //  b. ETT-Myoview 3/16:  Low risk, no ischemia, EF 58%   Hyperlipidemia    Hypertension    Morbid obesity ([place])    Paroxysmal atrial fibrillation ([place])    Occurring in 2008, with several recurrence since then (including in the setting of + cocaine on UDS).   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    Current Outpatient Medications  Medication Instructions   Accu-Chek Softclix Lancets lancets TEST UP TO 4 TIMES A DAY   amLODipine (NORVASC) 10 mg, Oral, Daily   amphetamine-dextroamphetamine (ADDERALL XR) 30 MG 24 hr capsule 30 mg, Oral, Daily PRN, 1 daily as needed   apixaban (ELIQUIS) 5 mg, Oral, 2 times daily   aspirin EC 81 mg, Oral, Daily, Swallow whole.   atorvastatin (LIPITOR) 80 mg, Oral, Daily   blood glucose meter kit and supplies KIT Dispense based on patient and insurance preference. Use up to four times daily as directed.   Blood Pressure Monitoring (BLOOD PRESSURE MONITOR/L CUFF) MISC 1 Units, Does not apply, Daily   cetirizine (ZYRTEC ALLERGY) 10 mg, Oral, Daily   flecainide (TAMBOCOR) 100 mg, Oral, 2 times daily, PLEASE CONTACT THE OFFICE TO SCHEDULE AN APPT FOR ADDITIONAL  REFILLS   fluticasone (FLONASE) 50 MCG/ACT nasal spray 1 spray, Each Nare, Daily   glucose blood (ACCU-CHEK GUIDE) test strip Please use strips to test morning fasting sugars and one hour after each meal, total 4x daily   Jardiance 25 mg, Oral, Daily   metFORMIN (GLUCOPHAGE) 1000 MG tablet TAKE 1 TABLET (1,000 MG TOTAL) BY MOUTH TWICE A DAY WITH FOOD   metoprolol succinate (TOPROL-XL) 100 MG 24 hr tablet TAKE 1 TABLET BY MOUTH DAILY. TAKE WITH OR IMMEDIATELY FOLLOWING A MEAL.   nicotine (NICODERM CQ - DOSED IN MG/24 HOURS) 21 mg/24hr patch PLACE 1 PATCH (21 MG TOTAL) ONTO THE SKIN DAILY.   olmesartan (BENICAR) 20 mg, Oral, Daily   pantoprazole (PROTONIX) 40 mg, Oral, Daily   polyethylene glycol (MIRALAX) 17 g, Oral, Daily   tirzepatide (MOUNJARO) 5 mg, Subcutaneous, Weekly    Physical Exam: There were no vitals filed for this visit.  GEN- NAD. A&O x 3. Normal affect. HEENT: normocephalic, atraumatic Lungs- CTAB, Normal effort Heart- {EPRHYTHM:28826}, No M/G/R Extremities- {EDEMA LEVEL:28147::"No"} peripheral edema. no clubbing or cyanosis; Skin- warm and dry, no rash or lesion  EKG is ordered today. Personal review shows ***  Additional studies reviewed include: Previous EP notes.   {Select studies to display:26339}  Assessment and Plan:  1.  Paroxymal atrial fibrillation/ atach EKG today shows *** *** post ablation.  Continue flecainide  100 mg BID.  Continue toprol 50 mg daily.  Continue Eliquis for chads2vasc score of 4. Denies bleeding.  Labs today.  Encouraged  lifestyle modification.   2. Hypertensive cardiovascular disease Continue to follow on current medications.  Echo 04/15/2021 LVEF 60-65%    3. OSA Encouraged nightly CPAP use   4. Morbid obesity There is no height or weight on file to calculate BMI.  We discussed lifestyle modification today   5. Cryptogenic Stroke Now on Eliquis No clear indication for Loop at this time with h/o AF, though s/p ablation  in the past.  TEE 05/2021 with normal EF and no PFO.   Follow up with [id number] {EPFOLLOW [id number]  Shirley Friar, PA-C  12/07/22 9:35 AM

## 2022-12-08 ENCOUNTER — Ambulatory Visit: Payer: 59 | Admitting: Internal Medicine

## 2022-12-08 ENCOUNTER — Ambulatory Visit: Payer: 59 | Attending: Student | Admitting: Student

## 2022-12-08 DIAGNOSIS — I10 Essential (primary) hypertension: Secondary | ICD-10-CM

## 2022-12-08 DIAGNOSIS — G4733 Obstructive sleep apnea (adult) (pediatric): Secondary | ICD-10-CM

## 2022-12-08 DIAGNOSIS — I480 Paroxysmal atrial fibrillation: Secondary | ICD-10-CM

## 2022-12-11 ENCOUNTER — Encounter: Payer: Self-pay | Admitting: Student

## 2022-12-11 ENCOUNTER — Ambulatory Visit: Payer: 59 | Admitting: Interventional Cardiology

## 2022-12-11 ENCOUNTER — Ambulatory Visit: Payer: 59 | Admitting: Student

## 2022-12-11 VITALS — BP 135/86 | HR 68 | Temp 98.4°F | Ht 69.0 in | Wt 285.9 lb

## 2022-12-11 DIAGNOSIS — I480 Paroxysmal atrial fibrillation: Secondary | ICD-10-CM

## 2022-12-11 DIAGNOSIS — Z0000 Encounter for general adult medical examination without abnormal findings: Secondary | ICD-10-CM

## 2022-12-11 DIAGNOSIS — Z720 Tobacco use: Secondary | ICD-10-CM

## 2022-12-11 DIAGNOSIS — E118 Type 2 diabetes mellitus with unspecified complications: Secondary | ICD-10-CM | POA: Diagnosis not present

## 2022-12-11 DIAGNOSIS — J302 Other seasonal allergic rhinitis: Secondary | ICD-10-CM

## 2022-12-11 DIAGNOSIS — I5032 Chronic diastolic (congestive) heart failure: Secondary | ICD-10-CM

## 2022-12-11 DIAGNOSIS — Z7984 Long term (current) use of oral hypoglycemic drugs: Secondary | ICD-10-CM

## 2022-12-11 DIAGNOSIS — F17211 Nicotine dependence, cigarettes, in remission: Secondary | ICD-10-CM

## 2022-12-11 DIAGNOSIS — Z23 Encounter for immunization: Secondary | ICD-10-CM | POA: Diagnosis not present

## 2022-12-11 DIAGNOSIS — G4733 Obstructive sleep apnea (adult) (pediatric): Secondary | ICD-10-CM

## 2022-12-11 LAB — POCT GLYCOSYLATED HEMOGLOBIN (HGB A1C): Hemoglobin A1C: 7.6 % — AB (ref 4.0–5.6)

## 2022-12-11 LAB — GLUCOSE, CAPILLARY: Glucose-Capillary: 121 mg/dL — ABNORMAL HIGH (ref 70–99)

## 2022-12-11 MED ORDER — CETIRIZINE HCL 10 MG PO TABS: 10.0000 mg | ORAL_TABLET | Freq: Every day | ORAL | 2 refills | Status: DC

## 2022-12-11 NOTE — Assessment & Plan Note (Signed)
Today [name] reports he has not had a cigarette since December. States that the cost of cigarettes mainly contributed to this, and he has not felt a craving to re-start at this time. Describes feeling disgusted at the smell. Discussed with him if cravings re-start there are medications we can use to help control them.

## 2022-12-11 NOTE — Progress Notes (Signed)
Internal Medicine Clinic Attending ? ?Case discussed with Dr. Braswell  At the time of the visit.  We reviewed the resident?s history and exam and pertinent patient test results.  I agree with the assessment, diagnosis, and plan of care documented in the resident?s note.  ?

## 2022-12-11 NOTE — Assessment & Plan Note (Signed)
-   Influenza vaccine given today

## 2022-12-11 NOTE — Assessment & Plan Note (Signed)
A1c today 7.6% from 8.2% at last visit. He remains asymptomatic, denying polyuria, polydipsia, neuropathy symptoms. He has been tolerating Mounjaro well without difficulties. No symptoms of urogenital infection being on Jardiance. Patient did request refill on test strips, but we discussed he no longer needs to do this regularly given he has been well-controlled for at least a year now. Foot exam completed today, he is to return to the the podiatrist soon. He was unable to obtain a urine sample today, would get this at his next visit.  - Metformin '1000mg'$  twice daily - Empagliflozin '25mg'$  daily - Mounjaro '5mg'$  weekly - Atorvastatin '80mg'$  daily - Foot exam completed today - Urine microalbumin/creatinine at next visit - Next ophthalmology visit in July - BMP today - Repeat A1c in 3mo

## 2022-12-11 NOTE — Assessment & Plan Note (Signed)
Reports compliance with CPAP, does not report any issues with this. Will need to continue with this to help reduce his risk of further cardiovascular insults.

## 2022-12-11 NOTE — Assessment & Plan Note (Signed)
Patient appears euvolemic on exam today. He is tolerating his medications well. Continues to be asymptomatic. Weight overall unchanged. Will continue with his current regimen.  - Metoprolol succinate '100mg'$  daily - Empagliflozin '25mg'$  daily

## 2022-12-11 NOTE — Patient Instructions (Signed)
Mr.Aleck Chad Cordial., it was a pleasure seeing you today!  Today we discussed: - Your A1c is 7.6% - this is at goal! We will continue with your current medications.  - Congratulations on quitting smoking. There are medications if your cravings are hard to control - just let us know.  - I have sent in your medication for allergies.   - Make an appointment to go back to the foot doctor (podiatrist)  I have ordered the following labs today:   Lab Orders         Microalbumin / Creatinine Urine Ratio         Glucose, capillary         BMP8+Anion Gap         POC Hbg A1C       I have ordered the following medication/changed the following medications:   Start the following medications: Meds ordered this encounter  Medications   cetirizine (ZYRTEC ALLERGY) 10 MG tablet    Sig: Take 1 tablet (10 mg total) by mouth daily.    Dispense:  90 tablet    Refill:  2     Follow-up: 3 months   Please make sure to arrive 15 minutes prior to your next appointment. If you arrive late, you may be asked to reschedule.   We look forward to seeing you next time. Please call our clinic at [phone] if you have any questions or concerns. The best time to call is Monday-Friday from 9am-4pm, but there is someone available 24/7. If after hours or the weekend, call the main hospital number and ask for the Internal Medicine Resident On-Call. If you need medication refills, please notify your pharmacy one week in advance and they will send [place] a request.  Thank you for letting us take part in your care. Wishing you the best!  Thank you, [name], MD

## 2022-12-11 NOTE — Progress Notes (Signed)
CC: diabetes, blood pressure follow-up  HPI:  [name] is a 52 y.o. person with medical history as below presenting to [facility] for diabetes and blood pressure follow-up.  Please see problem-based list for further details, assessments, and plans.  Past Medical History:  Diagnosis Date   Diabetes mellitus    GERD (gastroesophageal reflux disease)    History of alcohol abuse    History of cocaine use    History of echocardiogram    a. Echo 4/14: Moderate LVH, vigorous LVEF, EF 65-70%, normal wall motion, grade 2 diastolic dysfunction, mildly dilated aortic root and ascending aorta, ascending aorta 40 mm, aortic root 38 mm, mild LAE   History of pancreatitis 01/22/2019   Hx of cardiovascular stress test    a. GXT 5/14: No ischemic changes  //  b. ETT-Myoview 3/16:  Low risk, no ischemia, EF 58%   Hyperlipidemia    Hypertension    Morbid obesity ([place])    Paroxysmal atrial fibrillation ([place])    Occurring in 2008, with several recurrence since then (including in the setting of + cocaine on UDS).   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   Review of Systems:  As per HPI  Physical Exam:  Vitals:   12/11/22 1010  BP: 135/86  Pulse: 68  Temp: 98.4 F (36.9 C)  TempSrc: Oral  SpO2: 96%  Weight: 285 lb 14.4 oz (129.7 kg)  Height: '5\' 9"'$  (1.753 m)   General: Resting comfortably in no acute distress HENT: Normocephalic, atraumatic. Moist mucous membranes. CV: Regular rate, rhythm. No murmurs appreciated.  Pulm: Normal work of breathing on room air. Clear to auscultation bilaterally. MSK: Normal bulk, tone. No peripheral edema appreciated. Feet: Warm, dry. Bilateral elongated, thickened toenails. On dorsal portion of R great toe dime-sized eschar without any warmth, erythema, purulence, or surrounding fluctuance. No other cuts or scratches appreciated. No ulcers or non-blanching areas of  erythema. Dorsalis pedis pulses 2+ bilaterally. Neuro: Awake, alert, conversing appropriately. Grossly non-focal. Psych: Normal mood, affect, speech.   Assessment & Plan:   Paroxysmal atrial fibrillation ([place]) This is a chronic and stable issue. He denies any recent chest pain, dyspnea, or palpitations. He has been compliant with his medications. On exam he has regular rate and is in regular rhythm.    - Metoprolol succinate '100mg'$  daily - Flecainide '100mg'$  twice daily - Apixaban '5mg'$  twice daily  Chronic diastolic heart failure ([place]) Patient appears euvolemic on exam today. He is tolerating his medications well. Continues to be asymptomatic. Weight overall unchanged. Will continue with his current regimen.  - Metoprolol succinate '100mg'$  daily - Empagliflozin '25mg'$  daily  Obstructive sleep apnea (adult) (pediatric) Reports compliance with CPAP, does not report any issues with this. Will need to continue with this to help reduce his risk of further cardiovascular insults.   Tobacco user Today Mr. Venuti reports he has not had a cigarette since December. States that the cost of cigarettes mainly contributed to this, and he has not felt a craving to re-start at this time. Describes feeling disgusted at the smell. Discussed with him if cravings re-start there are medications we can use to help control them.   Type 2 diabetes mellitus with complications (HCC) 123456 today 7.6% from 8.2% at last visit. He remains asymptomatic, denying polyuria, polydipsia, neuropathy symptoms. He has been tolerating Mounjaro well without difficulties.  No symptoms of urogenital infection being on Jardiance. Patient did request refill on test strips, but we discussed he no longer needs to do this regularly given he has been well-controlled for at least a year now. Foot exam completed today, he is to return to the the podiatrist soon. He was unable to obtain a urine sample today, would get this at his next visit.  -  Metformin '1000mg'$  twice daily - Empagliflozin '25mg'$  daily - Mounjaro '5mg'$  weekly - Atorvastatin '80mg'$  daily - Foot exam completed today - Urine microalbumin/creatinine at next visit - Next ophthalmology visit in July - BMP today - Repeat A1c in 94mo Healthcare maintenance - Influenza vaccine given today  Patient discussed with Dr.  WMaurilio Lovely MD Internal Medicine PGY-3 Pager: [phone]

## 2022-12-11 NOTE — Assessment & Plan Note (Signed)
This is a chronic and stable issue. He denies any recent chest pain, dyspnea, or palpitations. He has been compliant with his medications. On exam he has regular rate and is in regular rhythm.    - Metoprolol succinate '100mg'$  daily - Flecainide '100mg'$  twice daily - Apixaban '5mg'$  twice daily

## 2022-12-12 ENCOUNTER — Encounter: Payer: Self-pay | Admitting: Student

## 2022-12-12 LAB — BMP8+ANION GAP
Anion Gap: 15 mmol/L (ref 10.0–18.0)
BUN/Creatinine Ratio: 15 (ref 9–20)
BUN: 21 mg/dL (ref 6–24)
CO2: 21 mmol/L (ref 20–29)
Calcium: 9.2 mg/dL (ref 8.7–10.2)
Chloride: 107 mmol/L — ABNORMAL HIGH (ref 96–106)
Creatinine, Ser: 1.36 mg/dL — ABNORMAL HIGH (ref 0.76–1.27)
Glucose: 120 mg/dL — ABNORMAL HIGH (ref 70–99)
Potassium: 5 mmol/L (ref 3.5–5.2)
Sodium: 143 mmol/L (ref 134–144)
eGFR: 63 mL/min/{1.73_m2} (ref >59)

## 2022-12-13 ENCOUNTER — Other Ambulatory Visit: Payer: Self-pay | Admitting: Student

## 2022-12-13 DIAGNOSIS — I480 Paroxysmal atrial fibrillation: Secondary | ICD-10-CM

## 2022-12-14 ENCOUNTER — Telehealth: Payer: Self-pay

## 2022-12-14 ENCOUNTER — Telehealth: Payer: Self-pay | Admitting: Internal Medicine

## 2022-12-14 NOTE — Telephone Encounter (Signed)
Patient states needs refill for Adderall. Pharmacy is [facility]. Patient phone number is [phone].

## 2022-12-14 NOTE — Telephone Encounter (Signed)
Patient states needs refill for Adderall. Pharmacy is [facility]. Patient phone number is [phone].      (This is his 2nd call today. He called this AM as well and I accidentally signed his encounter.)

## 2022-12-14 NOTE — Telephone Encounter (Signed)
Requesting lab results, please call pt back.  

## 2022-12-14 NOTE — Telephone Encounter (Signed)
Dr. Annamaria Boots, please advise if you are ok with refilling his Adderall. Thanks!

## 2022-12-15 MED ORDER — AMPHETAMINE-DEXTROAMPHET ER 30 MG PO CP24: 30.0000 mg | ORAL_CAPSULE | Freq: Every day | ORAL | 0 refills | Status: DC | PRN

## 2022-12-15 NOTE — Addendum Note (Signed)
Addended by: Baird Lyons D on: 12/15/2022 11:34 AM   Modules accepted: Orders

## 2022-12-15 NOTE — Telephone Encounter (Signed)
Called and spoke with patient. Patient stated that he got a phone call earlier and they told him that his adderall was already sent to the pharmacy.   Nothing further needed.

## 2022-12-15 NOTE — Telephone Encounter (Signed)
Called and spoke with patient. He is aware that Dr. Annamaria Boots has sent in the refill. Nothing further needed at time of call.

## 2022-12-15 NOTE — Telephone Encounter (Signed)
Adderall refilled

## 2022-12-30 NOTE — Progress Notes (Signed)
HPI  male smoker followed for management of OSA and narcolepsy with cataplexy, complicated by allergic rhinitis, atrial fibrillation, dCHF, DM, HTN NPSG 11/23/00- AHI 20 per hour. Hypnagogic hallucination associated with dreaming. 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 Vivid dreams as soon as he falls asleep Multiple Sleep Latency Test 10/30/2012-pathologic daytime hypersomnia, nonspecific, compatible with idiopathic hypersomnia or narcolepsy. Mean latency 0.9 minutes with one sleep onset REM event CT chest 12/05/19- mediastinal and bilateral adenopathy, interstitial prominence, ASCVD NPSG 05/03/21- AHI 29.6/ hr, desaturation to 81%, CPAP to 16 (O2 sat on CPAP 16 was 94%), body weight  306 lbs   Suggest CPAP auto 10-20  =============================================================   01/01/23- 52 year old male Smoker (14 pkyrs)  followed for management of OSA and Narcolepsy with cataplexy,, Mediastinal / Hilar Adenopathy, ILD,  complicated by allergic rhinitis, Atrial Fibrillation/Eliquis flecainide/ Xarelto, dCHF, Diastolic Dysfunction, CAD, CVA, DM2, HTN, Pancreatitis, Obesity, Tobacco use,  Covid vax- 3 Phizer - Adderall XR 30 mg, 1 daily CPAP auto 10-20/ [facility]            AirSense 10 AutoSet Download-compliance 77%, AHI 1.6/ hr Body weight today-292 lbs Covid vax-3 Phizer Flu vax- -----Pt doing well. No issues with cpap machine  Download reviewed and compliance goals discussed.  He does not wear his CPAP if he is having a lot of head congestion at that time.  We discussed management of allergic rhinitis again.  Adderall is still helpful for daytime alertness.  He does nap if needed and is careful with driving.  ROS-see HPI  + = positive Constitutional:   No-   weight loss, night sweats, fevers, +chills,  +fatigue, lassitude. HEENT:   No-  headaches, difficulty swallowing, tooth/dental problems, sore throat,       No-  sneezing, itching, ear ache, + nasal congestion, post nasal drip,   CV:  No-   chest pain, orthopnea, PND, swelling in lower extremities, anasarca, dizziness, palpitations Resp: + shortness of breath with exertion or at rest.              No-   productive cough,  No non-productive cough,  No- coughing up of blood.              No-   change in color of mucus.  No- wheezing.   Skin: No-   rash or lesions. GI:  No-   heartburn, indigestion, abdominal pain, nausea, vomiting,  GU:  MS:  No-   joint pain or swelling.  Neuro-     +HPI Psych:  No- change in mood or affect. No depression or anxiety.  No memory loss.  OBJ- Physical Exam   General- Alert/ calm, Oriented, Affect-appropriate, Distress- none acute, +morbidly obese,   Skin- rash-none, lesions- none, excoriation- none Lymphadenopathy- none Head- atraumatic            Eyes- Gross vision intact, PERRLA, conjunctivae and secretions clear            Ears- Hearing, canals-normal            Nose- rhinitis/ turbinate edema, no-Septal dev, mucus, polyps, erosion, perforation             Throat- Mallampati III , mucosa clear , drainage- none, tonsils- atrophic Neck- flexible , trachea midline, no stridor , thyroid nl, carotid no bruit Chest - symmetrical excursion , unlabored           Heart/CV- RRR today, no murmur , no gallop  , no rub, nl s1 s2                           -  JVD- none , edema- none, stasis changes- none, varices- none           Lung- clear to P&A, wheeze- none, cough- none , dullness-none, rub- none           Chest wall-  Abd-  Br/ Gen/ Rectal- Not done, not indicated Extrem- no edema Neuro- alert, nonfocal

## 2023-01-01 ENCOUNTER — Ambulatory Visit (INDEPENDENT_AMBULATORY_CARE_PROVIDER_SITE_OTHER): Payer: 59 | Admitting: Internal Medicine

## 2023-01-01 ENCOUNTER — Encounter: Payer: Self-pay | Admitting: Internal Medicine

## 2023-01-01 VITALS — BP 128/88 | HR 72 | Ht 69.0 in | Wt 292.0 lb

## 2023-01-01 DIAGNOSIS — G4733 Obstructive sleep apnea (adult) (pediatric): Secondary | ICD-10-CM | POA: Diagnosis not present

## 2023-01-01 DIAGNOSIS — J302 Other seasonal allergic rhinitis: Secondary | ICD-10-CM | POA: Diagnosis not present

## 2023-01-01 DIAGNOSIS — G47411 Narcolepsy with cataplexy: Secondary | ICD-10-CM | POA: Diagnosis not present

## 2023-01-01 DIAGNOSIS — J3089 Other allergic rhinitis: Secondary | ICD-10-CM

## 2023-01-01 NOTE — Patient Instructions (Signed)
Try to use your CPAP every night if you can.  For your stuffy nose- try using otc Flonae(fluticasone) nasal spray  1-2 puffs each nostril once every day at bedtime. It will work gradually.  Ok to call for Adderall refill when needed

## 2023-01-02 ENCOUNTER — Other Ambulatory Visit: Payer: Self-pay | Admitting: Student

## 2023-01-02 DIAGNOSIS — I480 Paroxysmal atrial fibrillation: Secondary | ICD-10-CM

## 2023-01-04 ENCOUNTER — Other Ambulatory Visit: Payer: Self-pay | Admitting: Student

## 2023-01-04 DIAGNOSIS — E785 Hyperlipidemia, unspecified: Secondary | ICD-10-CM

## 2023-01-15 ENCOUNTER — Telehealth: Payer: Self-pay | Admitting: Internal Medicine

## 2023-01-15 NOTE — Telephone Encounter (Signed)
Pt needs Adderall refill sent to  [facility]

## 2023-01-16 MED ORDER — AMPHETAMINE-DEXTROAMPHET ER 30 MG PO CP24: 30.0000 mg | ORAL_CAPSULE | Freq: Every day | ORAL | 0 refills | Status: DC | PRN

## 2023-01-16 NOTE — Telephone Encounter (Signed)
Adderall refilled

## 2023-01-16 NOTE — Telephone Encounter (Signed)
ATC X1 LVM for patient to call the office back. Please advise Adderall has been sent to pharmacy

## 2023-01-16 NOTE — Progress Notes (Signed)
Cardiology Office Note:    Date:  01/30/2023   ID:  Douglas Band., DOB Nov 21, 1970, MRN [id number]  PCP:  Evlyn Kanner, MD  [facility] Providers Cardiologist:  Lance Muss, MD Electrophysiologist:  [name], MD (Inactive)     Referring MD: Evlyn Kanner, MD   Chief Complaint:  Follow-up     History of Present Illness:   [name]. is a 52 y.o. male with  a hx of HTN, DM2, obesity, OSA on CPAP, prior ETOH/cocaine use and PAF.   He was evaluated by Dr. Delane Ginger in 4/14 in the hospital during an admission with PAF with RVR.   Ritalin was a drug to be avoided in the future since the AFib started with medicine.  He converted to NSR and long-term anticoagulation was recommended given his stroke risk.  Flecainide was added to maintain NSR.  He was to follow up in this office for ETT to rule out pro-arrhythmia, but was not seen here until 2/16. Review of his chart indicates he had an ETT at [facility] in 02/2013.  He was placed back on Flecainide and FU ETT-Myoview was low risk and neg for pro-arrhythmia.     CHADS2-VASc=2 (HTN, DM)   Patient has been taking Adderall for narcolepsy.  Now retired from his job with the city due to narcolepsy.     He was admitted to hospital 05/2021 with concerns for stroke. UDS positive for cocaine as well at that time. Echo 04/15/2021 LVEF 60-65% with "severe asymmetric LVH". Neurology notes recommend TEE to r/o PFO-no source of emboli.   Patient comes in for f/u. Denies chest pain, dyspnea, palpitations, edema. No regular exercise-still has right sided weakness. Quit smoking in January. Quit cocaine last October. A1C 7.6 in Feb. Drinks 2L soda a week.      Past Medical History:  Diagnosis Date   Diabetes mellitus    GERD (gastroesophageal reflux disease)    History of alcohol abuse    History of cocaine use    History of echocardiogram    a. Echo 4/14: Moderate LVH, vigorous LVEF, EF 65-70%, normal wall  motion, grade 2 diastolic dysfunction, mildly dilated aortic root and ascending aorta, ascending aorta 40 mm, aortic root 38 mm, mild LAE   History of pancreatitis 01/22/2019   Hx of cardiovascular stress test    a. GXT 5/14: No ischemic changes  //  b. ETT-Myoview 3/16:  Low risk, no ischemia, EF 58%   Hyperlipidemia    Hypertension    Morbid obesity    Paroxysmal atrial fibrillation    Occurring in 2008, with several recurrence since then (including in the setting of + cocaine on UDS).   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 SVT 09/2019   Current Medications: Current Meds  Medication Sig   Accu-Chek Softclix Lancets lancets TEST UP TO 4 TIMES A DAY   amLODipine (NORVASC) 10 MG tablet TAKE 1 TABLET BY MOUTH EVERY DAY   amphetamine-dextroamphetamine (ADDERALL XR) 30 MG 24 hr capsule Take 1 capsule (30 mg total) by mouth daily as needed (focus). 1 daily as needed   apixaban (ELIQUIS) 5 MG TABS tablet Take 1 tablet (5 mg total) by mouth 2 (two) times daily.   aspirin 81 MG EC tablet Take 1 tablet (81 mg total) by mouth daily. Swallow whole.   atorvastatin (LIPITOR) 80 MG tablet  TAKE 1 TABLET BY MOUTH EVERY DAY   blood glucose meter kit and supplies KIT Dispense based on patient and insurance preference. Use up to four times daily as directed.   Blood Pressure Monitoring (BLOOD PRESSURE MONITOR/L CUFF) MISC 1 Units by Does not apply route daily.   cetirizine (ZYRTEC ALLERGY) 10 MG tablet Take 1 tablet (10 mg total) by mouth daily.   empagliflozin (JARDIANCE) 25 MG TABS tablet TAKE 1 TABLET (25 MG TOTAL) BY MOUTH DAILY.   flecainide (TAMBOCOR) 100 MG tablet Take 1 tablet (100 mg total) by mouth 2 (two) times daily.   glucose blood (ACCU-CHEK GUIDE) test strip Please use strips to test morning fasting sugars and one hour after each meal, total 4x daily   metFORMIN (GLUCOPHAGE) 1000 MG tablet TAKE 1 TABLET (1,000 MG TOTAL) BY MOUTH  TWICE A DAY WITH FOOD   metoprolol succinate (TOPROL-XL) 100 MG 24 hr tablet TAKE 1 TABLET BY MOUTH DAILY. TAKE WITH OR IMMEDIATELY FOLLOWING A MEAL.   nicotine (NICODERM CQ - DOSED IN MG/24 HOURS) 21 mg/24hr patch PLACE 1 PATCH (21 MG TOTAL) ONTO THE SKIN DAILY.   olmesartan (BENICAR) 20 MG tablet TAKE 1 TABLET BY MOUTH EVERY DAY   pantoprazole (PROTONIX) 40 MG tablet TAKE 1 TABLET BY MOUTH EVERY DAY   polyethylene glycol (MIRALAX) 17 g packet Take 17 g by mouth daily.   tirzepatide [facility]) 5 MG/0.5ML Pen Inject 5 mg into the skin once a week.    Allergies:   Shrimp [shellfish allergy], Banana, Watermelon flavor, Almond oil, Other, Peanut-containing drug products, and Sulfa antibiotics   Social History   Tobacco Use   Smoking status: Former    Packs/day: 0.50    Years: 28.00    Additional pack years: 0.00    Total pack years: 14.00    Types: Cigarettes    Quit date: 10/15/2022    Years since quitting: 0.2   Smokeless tobacco: Former    Types: Snuff   Tobacco comments:    1 pack per week HEI 06/09/22  Vaping Use   Vaping Use: Never used  Substance Use Topics   Alcohol use: Yes    Alcohol/week: 6.0 standard drinks of alcohol    Types: 6 Standard drinks or equivalent per week    Comment: rarely   Drug use: Not Currently    Comment: cocaine in the past, none currently    Family Hx: The patient's family history includes Diabetes in his mother and paternal grandfather; Heart attack (age of onset: 50) in his father; Stroke in his maternal grandmother and paternal grandmother.  ROS     Physical Exam:    VS:  BP 114/71 (BP Location: Left Arm, Patient Position: Sitting, Cuff Size: Normal)   Pulse 73   Ht 5\' 9"  (1.753 m)   Wt 294 lb (133.4 kg)   BMI 43.42 kg/m     Wt Readings from Last 3 Encounters:  01/30/23 294 lb (133.4 kg)  01/01/23 292 lb (132.5 kg)  12/11/22 285 lb 14.4 oz (129.7 kg)    Physical Exam  GEN: Obese, in no acute distress  Neck: no JVD, carotid  bruits, or masses Cardiac:RRR; no murmurs, rubs, or gallops  Respiratory:  clear to auscultation bilaterally, normal work of breathing GI: soft, nontender, nondistended, + BS Ext: without cyanosis, clubbing, or edema, Good distal pulses bilaterally Neuro:  Alert and Oriented x 3,  Psych: euthymic mood, full affect        EKGs/Labs/Other Test Reviewed:  EKG:  EKG is   ordered today.  The ekg ordered today demonstrates NSR with first degree AV block and poor septal infarct  Recent Labs: 03/10/2022: ALT 35; Hemoglobin 13.2; Platelets 313 12/11/2022: BUN 21; Creatinine, Ser 1.36; Potassium 5.0; Sodium 143   Recent Lipid Panel No results for input(s): "CHOL", "TRIG", "HDL", "VLDL", "LDLCALC", "LDLDIRECT" in the last 8760 hours.   Prior CV Studies:   TEE 05/2021 IMPRESSIONS     1. Left ventricular ejection fraction, by estimation, is 60 to 65%. The  left ventricle has normal function. There is severe left ventricular  hypertrophy.   2. Right ventricular systolic function is normal. The right ventricular  size is mildly enlarged.   3. No left atrial/left atrial appendage thrombus was detected.   4. The mitral valve is normal in structure. Trivial mitral valve  regurgitation.   5. The aortic valve is tricuspid. Aortic valve regurgitation is not  visualized. No aortic stenosis is present.   6. Aortic dilatation noted. There is mild dilatation of the aortic root,  measuring 40 mm.   7. Agitated saline contrast bubble study was negative, with no evidence  of any interatrial shunt.   Conclusion(s)/Recommendation(s): No LA/LAA thrombus identified. Negative  bubble study for interatrial shunt. No intracardiac source of embolism  detected on this on this transesophageal echocardiogram.     Risk Assessment/Calculations/Metrics:    CHA2DS2-VASc Score = 4   This indicates a 4.8% annual risk of stroke. The patient's score is based upon: CHF History: 0 HTN History: 1 Diabetes  History: 1 Stroke History: 2 Vascular Disease History: 0 Age Score: 0 Gender Score: 0             ASSESSMENT & PLAN:   No problem-specific Assessment & Plan notes found for this encounter.     Paroxymal atrial fibrillation/ atach Well controlled post ablation.  Continue flecainide 100 mg BID. -Sees EPS today Continue toprol 50 mg daily.  Continue Eliquis for chads2vasc score of 4. Denies bleeding.  Labs today.  Encouraged lifestyle modification.   Hypertensive cardiovascular disease Controlled Echo 04/15/2021 LVEF 60-65%    OSA Uses cpap nightly     Morbid obesity Body mass index is 43 We discussed lifestyle modification today. 30 min exercise daily.   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 He's also on ASA. Not sure if this is from neurology.  Still has right sided weakness                Dispo:  No follow-ups on file.   Medication Adjustments/Labs and Tests Ordered: Current medicines are reviewed at length with the patient today.  Concerns regarding medicines are outlined above.  Tests Ordered: Orders Placed This Encounter  Procedures   Lipid panel   CBC   Medication Changes: No orders of the defined types were placed in this encounter.  Elson Clan, PA-C  01/30/2023 9:49 AM    [facility] [address] [place], [place], [place]  [address] Phone: [phone]; Fax: [phone]

## 2023-01-24 ENCOUNTER — Encounter: Payer: Self-pay | Admitting: Internal Medicine

## 2023-01-24 NOTE — Assessment & Plan Note (Signed)
Emphasized seasonal use of Flonase when needed

## 2023-01-24 NOTE — Assessment & Plan Note (Signed)
Benefits from CPAP.  Compliance goals and comfort issues emphasized. Plan-continue auto 10-20

## 2023-01-24 NOTE — Assessment & Plan Note (Signed)
Uses Adderall appropriately as instructed.  Continued emphasis on good sleep hygiene, naps, safe driving responsibility

## 2023-01-29 NOTE — Progress Notes (Unsigned)
  Electrophysiology Office Note:   Date:  01/30/2023  ID:  Douglas Band., DOB 10/29/1970, MRN [id number]  Primary Cardiologist: Lance Muss, MD Electrophysiologist: Dr. Johney Frame ->  [name], MD (Inactive)   History of Present Illness:   [name]. is a 52 y.o. male with h/o HLD, HTN, AF s/p ablation 11/2019 seen today for routine electrophysiology followup. Since last being seen in our clinic the patient reports doing very well.  he denies chest pain, palpitations, dyspnea, PND, orthopnea, nausea, vomiting, dizziness, syncope, edema, weight gain, or early satiety.   Review of systems complete and found to be negative unless listed in HPI.   AF history S/p AF and SVT ablation 11/27/2019  AAD history Flecainide --> Ongoing  Studies Reviewed:    EKG is ordered today. Personal review shows NSR at 69 bpm with 1st degree AV block   Risk Assessment/Calculations:             Physical Exam:   VS:  BP 114/71   Pulse 73   Ht 5\' 9"  (1.753 m)   Wt 294 lb (133.4 kg)   SpO2 95%   BMI 43.42 kg/m    Wt Readings from Last 3 Encounters:  01/30/23 294 lb (133.4 kg)  01/30/23 294 lb (133.4 kg)  01/01/23 292 lb (132.5 kg)     GEN: Well nourished, well developed in no acute distress NECK: No JVD; No carotid bruits CARDIAC: Regular rate and rhythm, no murmurs, rubs, gallops RESPIRATORY:  Clear to auscultation without rales, wheezing or rhonchi  ABDOMEN: Soft, non-tender, non-distended EXTREMITIES:  No edema; No deformity   ASSESSMENT AND PLAN:     Paroxymal atrial fibrillation/ atach EKG today shows NSR at 69 bpm with 1st degree AV block. Well controlled post ablation   Continue flecainide 100 mg BID.  Continue toprol 100 mg BID  Continue eliquis for CHA2DS2VASc  of at least 4. Labs today.  Encouraged lifestyle modification.   HTN Stable on current regimen  Echo 04/15/2021 LVEF 60-65%    OSA  Encouraged nightly CPAP    Obesity Body mass index is  43.42 kg/m.  Encouraged lifestyle modification    Cryptogenic Stroke Now on Eliquis Normal TEE 8/202. No PFO.   Follow up with Dr. Elberta Fortis in 12 months (alternating 6 months for EKGs on flecainide with gen cards)  Signed, Graciella Freer, PA-C

## 2023-01-30 ENCOUNTER — Ambulatory Visit: Payer: 59 | Attending: Interventional Cardiology | Admitting: Physician Assistant

## 2023-01-30 ENCOUNTER — Ambulatory Visit (INDEPENDENT_AMBULATORY_CARE_PROVIDER_SITE_OTHER): Payer: 59 | Admitting: Student

## 2023-01-30 ENCOUNTER — Encounter: Payer: Self-pay | Admitting: Student

## 2023-01-30 ENCOUNTER — Encounter: Payer: Self-pay | Admitting: Physician Assistant

## 2023-01-30 VITALS — BP 114/71 | HR 73 | Ht 69.0 in | Wt 294.0 lb

## 2023-01-30 DIAGNOSIS — G4733 Obstructive sleep apnea (adult) (pediatric): Secondary | ICD-10-CM

## 2023-01-30 DIAGNOSIS — I480 Paroxysmal atrial fibrillation: Secondary | ICD-10-CM

## 2023-01-30 DIAGNOSIS — I10 Essential (primary) hypertension: Secondary | ICD-10-CM

## 2023-01-30 DIAGNOSIS — Z8673 Personal history of transient ischemic attack (TIA), and cerebral infarction without residual deficits: Secondary | ICD-10-CM

## 2023-01-30 NOTE — Patient Instructions (Addendum)
Medication Instructions:  Your physician recommends that you continue on your current medications as directed. Please refer to the Current Medication list given to you today.  *If you need a refill on your cardiac medications before your next appointment, please call your pharmacy*   Lab Work: CBC, LIPIDS - today   If you have labs (blood work) drawn today and your tests are completely normal, you will receive your results only by: MyChart Message (if you have MyChart) OR A paper copy in the mail If you have any lab test that is abnormal or we need to change your treatment, we will call you to review the results.   Testing/Procedures: None ordered    Follow-Up: At [facility], you and your health needs are our priority.  As part of our continuing mission to provide you with exceptional heart care, we have created designated Provider Care Teams.  These Care Teams include your primary Cardiologist (physician) and Advanced Practice Providers (APPs -  Physician Assistants and Nurse Practitioners) who all work together to provide you with the care you need, when you need it.  We recommend signing up for the patient portal called "MyChart".  Sign up information is provided on this After Visit Summary.  MyChart is used to connect with patients for Virtual Visits (Telemedicine).  Patients are able to view lab/test results, encounter notes, upcoming appointments, etc.  Non-urgent messages can be sent to your provider as well.   To learn more about what you can do with MyChart, go to [email or web address].    Your next appointment:   6 month(s)  Provider:   Lance Muss, MD     Other Instructions Your physician recommends that you get at least 150 minutes of exercise a week

## 2023-01-30 NOTE — Patient Instructions (Signed)
Medication Instructions:  Your physician recommends that you continue on your current medications as directed. Please refer to the Current Medication list given to you today.  *If you need a refill on your cardiac medications before your next appointment, please call your pharmacy*  Lab Work: BMET-TODAY If you have labs (blood work) drawn today and your tests are completely normal, you will receive your results only by: MyChart Message (if you have MyChart) OR A paper copy in the mail If you have any lab test that is abnormal or we need to change your treatment, we will call you to review the results.   Follow-Up: At [facility], you and your health needs are our priority.  As part of our continuing mission to provide you with exceptional heart care, we have created designated Provider Care Teams.  These Care Teams include your primary Cardiologist (physician) and Advanced Practice Providers (APPs -  Physician Assistants and Nurse Practitioners) who all work together to provide you with the care you need, when you need it.  We recommend signing up for the patient portal called "MyChart".  Sign up information is provided on this After Visit Summary.  MyChart is used to connect with patients for Virtual Visits (Telemedicine).  Patients are able to view lab/test results, encounter notes, upcoming appointments, etc.  Non-urgent messages can be sent to your provider as well.   To learn more about what you can do with MyChart, go to [email or web address].    Your next appointment:   1 year(s)  Provider:   Loman Brooklyn, MD

## 2023-01-31 ENCOUNTER — Emergency Department (HOSPITAL_COMMUNITY)
Admission: EM | Admit: 2023-01-31 | Discharge: 2023-01-31 | Disposition: A | Discharge status: Home / Self Care | Payer: 59 | Attending: Student | Admitting: Student

## 2023-01-31 ENCOUNTER — Other Ambulatory Visit: Payer: 59

## 2023-01-31 ENCOUNTER — Telehealth: Payer: Self-pay | Admitting: Interventional Cardiology

## 2023-01-31 ENCOUNTER — Encounter (HOSPITAL_COMMUNITY): Payer: Self-pay

## 2023-01-31 DIAGNOSIS — Z79899 Other long term (current) drug therapy: Secondary | ICD-10-CM | POA: Diagnosis not present

## 2023-01-31 DIAGNOSIS — F17200 Nicotine dependence, unspecified, uncomplicated: Secondary | ICD-10-CM | POA: Insufficient documentation

## 2023-01-31 DIAGNOSIS — Z7984 Long term (current) use of oral hypoglycemic drugs: Secondary | ICD-10-CM | POA: Diagnosis not present

## 2023-01-31 DIAGNOSIS — I130 Hypertensive heart and chronic kidney disease with heart failure and stage 1 through stage 4 chronic kidney disease, or unspecified chronic kidney disease: Secondary | ICD-10-CM | POA: Diagnosis not present

## 2023-01-31 DIAGNOSIS — E119 Type 2 diabetes mellitus without complications: Secondary | ICD-10-CM | POA: Diagnosis not present

## 2023-01-31 DIAGNOSIS — E875 Hyperkalemia: Secondary | ICD-10-CM | POA: Diagnosis not present

## 2023-01-31 DIAGNOSIS — F1410 Cocaine abuse, uncomplicated: Secondary | ICD-10-CM | POA: Diagnosis not present

## 2023-01-31 DIAGNOSIS — M545 Low back pain, unspecified: Secondary | ICD-10-CM | POA: Diagnosis present

## 2023-01-31 DIAGNOSIS — Z7982 Long term (current) use of aspirin: Secondary | ICD-10-CM | POA: Insufficient documentation

## 2023-01-31 DIAGNOSIS — I5032 Chronic diastolic (congestive) heart failure: Secondary | ICD-10-CM | POA: Insufficient documentation

## 2023-01-31 DIAGNOSIS — N183 Chronic kidney disease, stage 3 unspecified: Secondary | ICD-10-CM | POA: Insufficient documentation

## 2023-01-31 DIAGNOSIS — Z91010 Allergy to peanuts: Secondary | ICD-10-CM | POA: Diagnosis not present

## 2023-01-31 DIAGNOSIS — Z7901 Long term (current) use of anticoagulants: Secondary | ICD-10-CM | POA: Diagnosis not present

## 2023-01-31 LAB — BASIC METABOLIC PANEL
Anion gap: 8 (ref 5–15)
BUN/Creatinine Ratio: 16 (ref 9–20)
BUN: 24 mg/dL (ref 6–24)
BUN: 27 mg/dL — ABNORMAL HIGH (ref 6–20)
CO2: 21 mmol/L (ref 20–29)
CO2: 23 mmol/L (ref 22–32)
Calcium: 9 mg/dL (ref 8.7–10.2)
Calcium: 9.2 mg/dL (ref 8.9–10.3)
Chloride: 105 mmol/L (ref 96–106)
Chloride: 105 mmol/L (ref 98–111)
Creatinine, Ser: 1.53 mg/dL — ABNORMAL HIGH (ref 0.76–1.27)
Creatinine, Ser: 1.58 mg/dL — ABNORMAL HIGH (ref 0.61–1.24)
GFR, Estimated: 52 mL/min — ABNORMAL LOW (ref >60)
Glucose, Bld: 157 mg/dL — ABNORMAL HIGH (ref 70–99)
Glucose: 131 mg/dL — ABNORMAL HIGH (ref 70–99)
Potassium: 5.8 mmol/L (ref 3.5–5.2)
Potassium: 5 mmol/L (ref 3.5–5.1)
Sodium: 140 mmol/L (ref 134–144)
Sodium: 136 mmol/L (ref 135–145)
eGFR: 54 mL/min/{1.73_m2} — ABNORMAL LOW (ref >59)

## 2023-01-31 LAB — LIPID PANEL
Chol/HDL Ratio: 4.1 ratio (ref 0.0–5.0)
Cholesterol, Total: 139 mg/dL (ref 100–199)
HDL: 34 mg/dL — ABNORMAL LOW (ref >39)
LDL Chol Calc (NIH): 67 mg/dL (ref 0–99)
Triglycerides: 230 mg/dL — ABNORMAL HIGH (ref 0–149)
VLDL Cholesterol Cal: 38 mg/dL (ref 5–40)

## 2023-01-31 LAB — CBC
HCT: 44.5 % (ref 39.0–52.0)
Hematocrit: 43.8 % (ref 37.5–51.0)
Hemoglobin: 14.8 g/dL (ref 13.0–17.7)
Hemoglobin: 14.4 g/dL (ref 13.0–17.0)
MCH: 28.1 pg (ref 26.6–33.0)
MCH: 27.6 pg (ref 26.0–34.0)
MCHC: 33.8 g/dL (ref 31.5–35.7)
MCHC: 32.4 g/dL (ref 30.0–36.0)
MCV: 83 fL (ref 79–97)
MCV: 85.4 fL (ref 80.0–100.0)
Platelets: 311 10*3/uL (ref 150–450)
Platelets: 309 10*3/uL (ref 150–400)
RBC: 5.27 x10E6/uL (ref 4.14–5.80)
RBC: 5.21 MIL/uL (ref 4.22–5.81)
RDW: 13.8 % (ref 11.6–15.4)
RDW: 13.3 % (ref 11.5–15.5)
WBC: 7.9 10*3/uL (ref 3.4–10.8)
WBC: 6.9 10*3/uL (ref 4.0–10.5)
nRBC: 0 % (ref 0.0–0.2)

## 2023-01-31 MED ORDER — LIDOCAINE 5 % EX PTCH
1.0000 | MEDICATED_PATCH | CUTANEOUS | Status: DC
  Administered 2023-01-31: 1 via TRANSDERMAL
  Filled 2023-01-31: qty 1

## 2023-01-31 MED ORDER — LIDOCAINE 5 % EX PTCH: 1.0000 | MEDICATED_PATCH | CUTANEOUS | 0 refills | Status: DC

## 2023-01-31 MED ORDER — DICLOFENAC SODIUM 1 % EX GEL: 4.0000 g | Freq: Four times a day (QID) | CUTANEOUS | 1 refills | Status: DC

## 2023-01-31 MED ORDER — ACETAMINOPHEN 500 MG PO TABS
1000.0000 mg | ORAL_TABLET | Freq: Once | ORAL | Status: AC
  Administered 2023-01-31: 1000 mg via ORAL
  Filled 2023-01-31: qty 2

## 2023-01-31 MED ORDER — KETOROLAC TROMETHAMINE 15 MG/ML IJ SOLN
15.0000 mg | Freq: Once | INTRAMUSCULAR | Status: AC
  Administered 2023-01-31: 15 mg via INTRAMUSCULAR
  Filled 2023-01-31: qty 1

## 2023-01-31 MED ORDER — SODIUM ZIRCONIUM CYCLOSILICATE 10 G PO PACK
10.0000 g | PACK | Freq: Once | ORAL | Status: AC
  Administered 2023-01-31: 10 g via ORAL
  Filled 2023-01-31: qty 1

## 2023-01-31 NOTE — ED Provider Notes (Signed)
[place] EMERGENCY DEPARTMENT AT [facility] Provider Note  CSN: [id number] Arrival date & time: 01/31/23 1321  Chief Complaint(s) No chief complaint on file.  HPI [name] [name]. is a 52 y.o. male with PMH HTN, HLD, A-fib status post ablation in February 2021 who presents emergency room for evaluation of low back pain and abnormal labs.  Patient was seen by his primary cardiologist yesterday who obtained routine labs and found the patient to have hyperkalemia to 5.8.  He states that over the last week she has had progressively worsening low back pain that started after manually is starting a crank lawnmower.  Denies bowel or bladder incontinence, numbness, tingling, weakness or saddle anesthesia or other red flag signs of back pain.  Patient is been taking no medications for his pain.   Past Medical History Past Medical History:  Diagnosis Date   Diabetes mellitus    GERD (gastroesophageal reflux disease)    History of alcohol abuse    History of cocaine use    History of echocardiogram    a. Echo 4/14: Moderate LVH, vigorous LVEF, EF 65-70%, normal wall motion, grade 2 diastolic dysfunction, mildly dilated aortic root and ascending aorta, ascending aorta 40 mm, aortic root 38 mm, mild LAE   History of pancreatitis 01/22/2019   Hx of cardiovascular stress test    a. GXT 5/14: No ischemic changes  //  b. ETT-Myoview 3/16:  Low risk, no ischemia, EF 58%   Hyperlipidemia    Hypertension    Morbid obesity    Paroxysmal atrial fibrillation    Occurring in 2008, with several recurrence since then (including in the setting of + cocaine on UDS).   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 associated with type 2 diabetes mellitus 03/09/2022   History of CVA (cerebrovascular accident) 04/14/2021   Non-proliferative diabetic retinopathy, severe, right eye 03/15/2021   Hypertensive retinopathy of both eyes, grade 2 03/15/2021   Healthcare maintenance 02/23/2021   Chronic kidney disease, stage 3    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 Authorizing Provider  Accu-Chek Softclix Lancets lancets TEST UP TO 4 TIMES A DAY 03/09/22   Ellison Carwin, MD  amLODipine (NORVASC) 10 MG tablet TAKE 1 TABLET BY MOUTH EVERY DAY 07/05/22   Rocky Morel, DO  amphetamine-dextroamphetamine (ADDERALL XR) 30 MG 24 hr capsule Take 1 capsule (30 mg total) by mouth daily as needed (focus). 1 daily as needed 01/16/23   Jetty Duhamel D, MD  apixaban (ELIQUIS) 5 MG TABS tablet Take 1 tablet (5 mg total) by mouth 2 (two)  times daily. 09/06/22   Rocky Morel, DO  aspirin 81 MG EC tablet Take 1 tablet (81 mg total) by mouth daily. Swallow whole. 08/04/21   Steffanie Rainwater, MD  atorvastatin (LIPITOR) 80 MG tablet TAKE 1 TABLET BY MOUTH EVERY DAY 01/04/23   Evlyn Kanner, MD  blood glucose meter kit and supplies KIT Dispense based on patient and insurance preference. Use up to four times daily as directed. 12/29/20   Evlyn Kanner, MD  Blood Pressure Monitoring (BLOOD  PRESSURE MONITOR/L CUFF) MISC 1 Units by Does not apply route daily. 03/19/19   Jeannie Fend, PA-C  cetirizine (ZYRTEC ALLERGY) 10 MG tablet Take 1 tablet (10 mg total) by mouth daily. 12/11/22 09/07/23  Evlyn Kanner, MD  empagliflozin (JARDIANCE) 25 MG TABS tablet TAKE 1 TABLET (25 MG TOTAL) BY MOUTH DAILY. 12/01/22   Evlyn Kanner, MD  flecainide (TAMBOCOR) 100 MG tablet Take 1 tablet (100 mg total) by mouth 2 (two) times daily. 01/04/23   Graciella Freer, PA-C  glucose blood (ACCU-CHEK GUIDE) test strip Please use strips to test morning fasting sugars and one hour after each meal, total 4x daily 03/10/22   Ellison Carwin, MD  metFORMIN (GLUCOPHAGE) 1000 MG tablet TAKE 1 TABLET (1,000 MG TOTAL) BY MOUTH TWICE A DAY WITH FOOD 06/05/22   Evlyn Kanner, MD  metoprolol succinate (TOPROL-XL) 100 MG 24 hr tablet TAKE 1 TABLET BY MOUTH DAILY. TAKE WITH OR IMMEDIATELY FOLLOWING A MEAL. 03/02/22   Evlyn Kanner, MD  nicotine (NICODERM CQ - DOSED IN MG/24 HOURS) 21 mg/24hr patch PLACE 1 PATCH (21 MG TOTAL) ONTO THE SKIN DAILY. 12/20/20   Doran Stabler, DO  olmesartan (BENICAR) 20 MG tablet TAKE 1 TABLET BY MOUTH EVERY DAY 11/09/22   Evlyn Kanner, MD  pantoprazole (PROTONIX) 40 MG tablet TAKE 1 TABLET BY MOUTH EVERY DAY 09/04/22   Nooruddin, Jason Fila, MD  polyethylene glycol (MIRALAX) 17 g packet Take 17 g by mouth daily. 08/17/21   Evlyn Kanner, MD  tirzepatide [facility]) 5 MG/0.5ML Pen Inject 5 mg into the skin once a week. 09/06/22   Rocky Morel, DO                                                                                                                                    Past Surgical History Past Surgical History:  Procedure Laterality Date   APPENDECTOMY     ATRIAL FIBRILLATION ABLATION N/A 11/27/2019   Procedure: ATRIAL FIBRILLATION ABLATION;  Surgeon: [name], MD;  Location: [facility] INVASIVE CV LAB;  Service: Cardiovascular;  Laterality: N/A;   BUBBLE STUDY  05/20/2021    Procedure: BUBBLE STUDY;  Surgeon: Little Ishikawa, MD;  Location: [facility] ENDOSCOPY;  Service: Cardiovascular;;   ROTATOR CUFF REPAIR     SVT ABLATION N/A 11/27/2019   Procedure: SVT ABLATION;  Surgeon: [name], MD;  Location: [facility] INVASIVE CV LAB;  Service: Cardiovascular;  Laterality: N/A;  TEE WITHOUT CARDIOVERSION N/A 05/20/2021   Procedure: TRANSESOPHAGEAL ECHOCARDIOGRAM (TEE);  Surgeon: Little Ishikawa, MD;  Location: [facility] ENDOSCOPY;  Service: Cardiovascular;  Laterality: N/A;   TONSILLECTOMY     Family History Family History  Problem Relation Age of Onset   Diabetes Mother    Heart attack Father 62   Stroke Maternal Grandmother    Stroke Paternal Grandmother    Diabetes Paternal Grandfather     Social History Social History   Tobacco Use   Smoking status: Former    Packs/day: 0.50    Years: 28.00    Additional pack years: 0.00    Total pack years: 14.00    Types: Cigarettes    Quit date: 10/15/2022    Years since quitting: 0.2   Smokeless tobacco: Former    Types: Snuff   Tobacco comments:    1 pack per week HEI 06/09/22  Vaping Use   Vaping Use: Never used  Substance Use Topics   Alcohol use: Yes    Alcohol/week: 6.0 standard drinks of alcohol    Types: 6 Standard drinks or equivalent per week    Comment: rarely   Drug use: Not Currently    Comment: cocaine in the past, none currently   Allergies Shrimp [shellfish allergy], Banana, Watermelon flavor, Almond oil, Other, Peanut-containing drug products, and Sulfa antibiotics  Review of Systems Review of Systems  Musculoskeletal:  Positive for back pain.    Physical Exam Vital Signs  I have reviewed the triage vital signs BP 117/85 (BP Location: Right Arm)   Pulse 77   Temp 98.5 F (36.9 C) (Oral)   Resp 20   SpO2 95%   Physical Exam Constitutional:      General: He is not in acute distress.    Appearance: Normal appearance.  HENT:     Head: Normocephalic and atraumatic.     Nose:  No congestion or rhinorrhea.  Eyes:     General:        Right eye: No discharge.        Left eye: No discharge.     Extraocular Movements: Extraocular movements intact.     Pupils: Pupils are equal, round, and reactive to light.  Cardiovascular:     Rate and Rhythm: Normal rate and regular rhythm.     Heart sounds: No murmur heard. Pulmonary:     Effort: No respiratory distress.     Breath sounds: No wheezing or rales.  Abdominal:     General: There is no distension.     Tenderness: There is no abdominal tenderness.  Musculoskeletal:        General: Tenderness present. Normal range of motion.     Cervical back: Normal range of motion.  Skin:    General: Skin is warm and dry.  Neurological:     General: No focal deficit present.     Mental Status: He is alert.     ED Results and Treatments Labs (all labs ordered are listed, but only abnormal results are displayed) Labs Reviewed  BASIC METABOLIC PANEL - Abnormal; Notable for the following components:      Result Value   Glucose, Bld 157 (*)    BUN 27 (*)    Creatinine, Ser 1.58 (*)    GFR, Estimated 52 (*)    All other components within normal limits  CBC  URINALYSIS, ROUTINE W REFLEX MICROSCOPIC  Radiology No results found.  Pertinent labs & imaging results that were available during my care of the patient were reviewed by me and considered in my medical decision making (see MDM for details).  Medications Ordered in ED Medications  sodium zirconium cyclosilicate (LOKELMA) packet 10 g (has no administration in time range)  lidocaine (LIDODERM) 5 % 1 patch (has no administration in time range)  acetaminophen (TYLENOL) tablet 1,000 mg (has no administration in time range)                                                                                                                                      Procedures Procedures  (including critical care time)  Medical Decision Making / ED Course   This patient presents to the ED for concern of back pain, abnormal labs, this involves an extensive number of treatment options, and is a complaint that carries with it a high risk of complications and morbidity.  The differential diagnosis includes musculoskeletal back pain, nephrolithiasis, pyelonephritis, fracture, electrolyte abnormality, lab error  MDM: Patient seen emergency department for evaluation of back pain and hyperkalemia.  Physical exam with some mild paraspinal back tenderness in the lumbar spine but no midline L-spine tenderness, neurologic exam unremarkable with no focal motor or sensory deficits.  Laboratory evaluation with an apparent resolution of his hyperkalemia with a potassium of 5.0 and he was given a single packet of Lokelma to ensure continued resolution of his hyperkalemia.  Creatinine 1.58 which is near baseline.  Patient pain controlled with a single dose of Toradol and a Lidoderm patch.  We did discuss that I cannot give the patient an extended course of NSAID therapy given his CKD and Eliquis use but single dose in the emergency department likely is not to cause significant side effects.  Pain improved on reevaluation and patient discharged with outpatient follow-up.   Additional history obtained:  -External records from outside source obtained and reviewed including: Chart review including previous notes, labs, imaging, consultation notes   Lab Tests: -I ordered, reviewed, and interpreted labs.   The pertinent results include:   Labs Reviewed  BASIC METABOLIC PANEL - Abnormal; Notable for the following components:      Result Value   Glucose, Bld 157 (*)    BUN 27 (*)    Creatinine, Ser 1.58 (*)    GFR, Estimated 52 (*)    All other components within normal limits  CBC  URINALYSIS, ROUTINE W REFLEX MICROSCOPIC      EKG   EKG  Interpretation  Date/Time:  Wednesday January 31 2023 14:14:33 EDT Ventricular Rate:  75 PR Interval:  200 QRS Duration: 88 QT Interval:  388 QTC Calculation: 433 R Axis:   -65 Text Interpretation: Normal sinus rhythm Left anterior fascicular block  PR deppression in aVL T wave inversion in III Abnormal ECG No previous ECGs available Confirmed by Korbyn Chopin ([id number]) on 01/31/2023  5:33:30 PM           Medicines ordered and prescription drug management: Meds ordered this encounter  Medications   sodium zirconium cyclosilicate (LOKELMA) packet 10 g   lidocaine (LIDODERM) 5 % 1 patch   acetaminophen (TYLENOL) tablet 1,000 mg    -I have reviewed the patients home medicines and have made adjustments as needed  Critical interventions none   Cardiac Monitoring: The patient was maintained on a cardiac monitor.  I personally viewed and interpreted the cardiac monitored which showed an underlying rhythm of: NSR  Social Determinants of Health:  Factors impacting patients care include: none   Reevaluation: After the interventions noted above, I reevaluated the patient and found that they have :improved  Co morbidities that complicate the patient evaluation  Past Medical History:  Diagnosis Date   Diabetes mellitus    GERD (gastroesophageal reflux disease)    History of alcohol abuse    History of cocaine use    History of echocardiogram    a. Echo 4/14: Moderate LVH, vigorous LVEF, EF 65-70%, normal wall motion, grade 2 diastolic dysfunction, mildly dilated aortic root and ascending aorta, ascending aorta 40 mm, aortic root 38 mm, mild LAE   History of pancreatitis 01/22/2019   Hx of cardiovascular stress test    a. GXT 5/14: No ischemic changes  //  b. ETT-Myoview 3/16:  Low risk, no ischemia, EF 58%   Hyperlipidemia    Hypertension    Morbid obesity    Paroxysmal atrial fibrillation    Occurring in 2008, with several recurrence since then (including in the setting of +  cocaine on UDS).   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 SVT 09/2019      Dispostion: I considered admission for this patient, but at this time he does not meet inpatient criteria for admission he is safe for discharge with outpatient follow-up     Final Clinical Impression(s) / ED Diagnoses Final diagnoses:  None     @PCDICTATION @    Glendora Score, MD 01/31/23 2311

## 2023-01-31 NOTE — Telephone Encounter (Signed)
[facility] called with a K of 5.8. Addressed by Any Tillery PA. See note with new orders.

## 2023-01-31 NOTE — ED Triage Notes (Addendum)
Pt c/o low back pain x 1 week; pt states he might have strained it while trying to start lawnmower; no pain currently, worse with movement, lying down; pt recently had bloodwork done, states doctor called him and told him his potassium was high; endorses hx of hyperkalemia

## 2023-01-31 NOTE — Telephone Encounter (Signed)
Called patient with results. Patient has transportation issues, so he is going to try to make it today, if he can find a ride. Informed patient of the importance of getting the lab work done sooner than later. Patient verbalized understanding and will hold his olmesartan for now. Placed orders for BMET and scheduled patient.

## 2023-01-31 NOTE — Telephone Encounter (Signed)
Closing encounter. NFN 

## 2023-01-31 NOTE — Telephone Encounter (Signed)
-----   Message from Graciella Freer, [place] sent at 01/31/2023  9:02 AM EDT ----- Seeing as his labs are otherwise near his baseline, can we please get a STAT recheck to check to see if this was hemolysis? Ideally this am so we can treat with lokelma if truly high.   Would have him please HOLD his olmesartan until we can get the recheck.   And please make sure he is not taking any potassium supplementation.

## 2023-01-31 NOTE — ED Provider Triage Note (Signed)
Emergency Medicine Provider Triage Evaluation Note  [name]. , a 52 y.o. male  was evaluated in triage.  Pt complains of back pain and abnormal lab.  States his labs were checked yesterday and his potassium was found to be high.  His primary care provider advised him to be evaluated in the ED.  She states he has had a history of hyperkalemia.  Denies diuretic use.  Denies chest pain or palpitations.  Also states he is has lower back pain.  Started a week ago after he was pulling his lawnmower around his yard.  It is all about the lower back with the most pain being in the right lower back.  It is nonradiating.  Denies saddle anesthesia, urinary or bowel incontinence, fever, extremity numbness or weakness.  Review of Systems  Positive: See above Negative: see above  Physical Exam  BP 117/85 (BP Location: Right Arm)   Pulse 77   Temp 98.5 F (36.9 C) (Oral)   Resp 20   SpO2 95%  Gen:   Awake, no distress   Resp:  Normal effort  MSK:   Moves extremities without difficulty  Other:    Medical Decision Making  Medically screening exam initiated at 1:58 PM.  Appropriate orders placed.  [name]. was informed that the remainder of the evaluation will be completed by another provider, this initial triage assessment does not replace that evaluation, and the importance of remaining in the ED until their evaluation is complete.  Work up started   Douglas Eagle, PA-C 01/31/23 1359

## 2023-01-31 NOTE — Telephone Encounter (Signed)
Calling with critical results for pt. Call transferred 

## 2023-02-01 ENCOUNTER — Other Ambulatory Visit: Payer: Self-pay

## 2023-02-01 ENCOUNTER — Observation Stay (HOSPITAL_COMMUNITY): Payer: 59

## 2023-02-01 ENCOUNTER — Encounter (HOSPITAL_COMMUNITY): Payer: Self-pay

## 2023-02-01 ENCOUNTER — Emergency Department (HOSPITAL_COMMUNITY): Payer: 59

## 2023-02-01 ENCOUNTER — Observation Stay (HOSPITAL_COMMUNITY)
Admission: EM | Admit: 2023-02-01 | Discharge: 2023-02-02 | Disposition: A | Discharge status: Home / Self Care | Payer: 59 | Attending: Internal Medicine | Admitting: Internal Medicine

## 2023-02-01 DIAGNOSIS — I480 Paroxysmal atrial fibrillation: Secondary | ICD-10-CM | POA: Insufficient documentation

## 2023-02-01 DIAGNOSIS — Z91010 Allergy to peanuts: Secondary | ICD-10-CM | POA: Insufficient documentation

## 2023-02-01 DIAGNOSIS — Z87891 Personal history of nicotine dependence: Secondary | ICD-10-CM | POA: Insufficient documentation

## 2023-02-01 DIAGNOSIS — N179 Acute kidney failure, unspecified: Secondary | ICD-10-CM | POA: Diagnosis not present

## 2023-02-01 DIAGNOSIS — Z7982 Long term (current) use of aspirin: Secondary | ICD-10-CM | POA: Diagnosis not present

## 2023-02-01 DIAGNOSIS — I10 Essential (primary) hypertension: Secondary | ICD-10-CM | POA: Insufficient documentation

## 2023-02-01 DIAGNOSIS — Z7984 Long term (current) use of oral hypoglycemic drugs: Secondary | ICD-10-CM | POA: Insufficient documentation

## 2023-02-01 DIAGNOSIS — R109 Unspecified abdominal pain: Secondary | ICD-10-CM | POA: Diagnosis present

## 2023-02-01 DIAGNOSIS — E875 Hyperkalemia: Secondary | ICD-10-CM | POA: Diagnosis not present

## 2023-02-01 DIAGNOSIS — Z7901 Long term (current) use of anticoagulants: Secondary | ICD-10-CM | POA: Diagnosis not present

## 2023-02-01 DIAGNOSIS — E119 Type 2 diabetes mellitus without complications: Secondary | ICD-10-CM | POA: Diagnosis not present

## 2023-02-01 DIAGNOSIS — Z8673 Personal history of transient ischemic attack (TIA), and cerebral infarction without residual deficits: Secondary | ICD-10-CM | POA: Insufficient documentation

## 2023-02-01 DIAGNOSIS — N189 Chronic kidney disease, unspecified: Secondary | ICD-10-CM

## 2023-02-01 DIAGNOSIS — Z79899 Other long term (current) drug therapy: Secondary | ICD-10-CM | POA: Insufficient documentation

## 2023-02-01 DIAGNOSIS — E118 Type 2 diabetes mellitus with unspecified complications: Secondary | ICD-10-CM

## 2023-02-01 LAB — COMPREHENSIVE METABOLIC PANEL
ALT: 41 U/L (ref 0–44)
AST: 39 U/L (ref 15–41)
Albumin: 3.9 g/dL (ref 3.5–5.0)
Alkaline Phosphatase: 81 U/L (ref 38–126)
Anion gap: 7 (ref 5–15)
BUN: 37 mg/dL — ABNORMAL HIGH (ref 6–20)
CO2: 23 mmol/L (ref 22–32)
Calcium: 8.4 mg/dL — ABNORMAL LOW (ref 8.9–10.3)
Chloride: 107 mmol/L (ref 98–111)
Creatinine, Ser: 1.85 mg/dL — ABNORMAL HIGH (ref 0.61–1.24)
GFR, Estimated: 43 mL/min — ABNORMAL LOW (ref >60)
Glucose, Bld: 186 mg/dL — ABNORMAL HIGH (ref 70–99)
Potassium: 6.1 mmol/L — ABNORMAL HIGH (ref 3.5–5.1)
Sodium: 137 mmol/L (ref 135–145)
Total Bilirubin: 1.1 mg/dL (ref 0.3–1.2)
Total Protein: 8.4 g/dL — ABNORMAL HIGH (ref 6.5–8.1)

## 2023-02-01 LAB — CBC WITH DIFFERENTIAL/PLATELET
Abs Immature Granulocytes: 0.03 10*3/uL (ref 0.00–0.07)
Basophils Absolute: 0.1 10*3/uL (ref 0.0–0.1)
Basophils Relative: 1 %
Eosinophils Absolute: 0.4 10*3/uL (ref 0.0–0.5)
Eosinophils Relative: 4 %
HCT: 47.8 % (ref 39.0–52.0)
Hemoglobin: 15.5 g/dL (ref 13.0–17.0)
Immature Granulocytes: 0 %
Lymphocytes Relative: 10 %
Lymphs Abs: 1 10*3/uL (ref 0.7–4.0)
MCH: 27.8 pg (ref 26.0–34.0)
MCHC: 32.4 g/dL (ref 30.0–36.0)
MCV: 85.7 fL (ref 80.0–100.0)
Monocytes Absolute: 0.8 10*3/uL (ref 0.1–1.0)
Monocytes Relative: 8 %
Neutro Abs: 7.9 10*3/uL — ABNORMAL HIGH (ref 1.7–7.7)
Neutrophils Relative %: 77 %
Platelets: UNDETERMINED 10*3/uL (ref 150–400)
RBC: 5.58 MIL/uL (ref 4.22–5.81)
RDW: 13.4 % (ref 11.5–15.5)
WBC: 10.3 10*3/uL (ref 4.0–10.5)
nRBC: 0 % (ref 0.0–0.2)

## 2023-02-01 LAB — RAPID URINE DRUG SCREEN, HOSP PERFORMED
Amphetamines: NOT DETECTED
Barbiturates: NOT DETECTED
Benzodiazepines: NOT DETECTED
Cocaine: NOT DETECTED
Opiates: POSITIVE — AB
Tetrahydrocannabinol: NOT DETECTED

## 2023-02-01 LAB — URINALYSIS, ROUTINE W REFLEX MICROSCOPIC
Bacteria, UA: NONE SEEN
Bilirubin Urine: NEGATIVE
Glucose, UA: >=500 mg/dL — AB
Ketones, ur: NEGATIVE mg/dL
Leukocytes,Ua: NEGATIVE
Nitrite: NEGATIVE
Protein, ur: 100 mg/dL — AB
Specific Gravity, Urine: 1.025 (ref 1.005–1.030)
pH: 5 (ref 5.0–8.0)

## 2023-02-01 LAB — POTASSIUM: Potassium: 5.4 mmol/L — ABNORMAL HIGH (ref 3.5–5.1)

## 2023-02-01 LAB — GLUCOSE, CAPILLARY: Glucose-Capillary: 94 mg/dL (ref 70–99)

## 2023-02-01 LAB — CREATININE, URINE, RANDOM: Creatinine, Urine: 108 mg/dL

## 2023-02-01 LAB — SODIUM, URINE, RANDOM: Sodium, Ur: 56 mmol/L

## 2023-02-01 LAB — LIPASE, BLOOD: Lipase: 41 U/L (ref 11–51)

## 2023-02-01 MED ORDER — NICOTINE 21 MG/24HR TD PT24
21.0000 mg | MEDICATED_PATCH | Freq: Every day | TRANSDERMAL | Status: DC
  Filled 2023-02-01 (×2): qty 1

## 2023-02-01 MED ORDER — LORATADINE 10 MG PO TABS
10.0000 mg | ORAL_TABLET | Freq: Every day | ORAL | Status: DC
  Administered 2023-02-02: 10 mg via ORAL
  Filled 2023-02-01: qty 1

## 2023-02-01 MED ORDER — INSULIN ASPART 100 UNIT/ML IJ SOLN: 0.0000 [IU] | Freq: Every day | INTRAMUSCULAR | Status: DC

## 2023-02-01 MED ORDER — INSULIN ASPART 100 UNIT/ML IJ SOLN: 0.0000 [IU] | Freq: Three times a day (TID) | INTRAMUSCULAR | Status: DC

## 2023-02-01 MED ORDER — POLYETHYLENE GLYCOL 3350 17 G PO PACK: 17.0000 g | PACK | Freq: Every day | ORAL | Status: DC | PRN

## 2023-02-01 MED ORDER — ALBUTEROL SULFATE (2.5 MG/3ML) 0.083% IN NEBU: 2.5000 mg | INHALATION_SOLUTION | RESPIRATORY_TRACT | Status: DC | PRN

## 2023-02-01 MED ORDER — ONDANSETRON HCL 4 MG/2ML IJ SOLN
4.0000 mg | Freq: Once | INTRAMUSCULAR | Status: AC
  Administered 2023-02-01: 4 mg via INTRAVENOUS
  Filled 2023-02-01: qty 2

## 2023-02-01 MED ORDER — FLECAINIDE ACETATE 100 MG PO TABS
100.0000 mg | ORAL_TABLET | Freq: Two times a day (BID) | ORAL | Status: DC
  Administered 2023-02-01 – 2023-02-02 (×2): 100 mg via ORAL
  Filled 2023-02-01 (×2): qty 1

## 2023-02-01 MED ORDER — PANTOPRAZOLE SODIUM 40 MG PO TBEC
40.0000 mg | DELAYED_RELEASE_TABLET | Freq: Every day | ORAL | Status: DC
  Administered 2023-02-02: 40 mg via ORAL
  Filled 2023-02-01: qty 1

## 2023-02-01 MED ORDER — LIDOCAINE 5 % EX PTCH
1.0000 | MEDICATED_PATCH | Freq: Every day | CUTANEOUS | Status: DC
  Administered 2023-02-01: 1 via TRANSDERMAL
  Filled 2023-02-01: qty 1

## 2023-02-01 MED ORDER — METHOCARBAMOL 1000 MG/10ML IJ SOLN: 500.0000 mg | Freq: Four times a day (QID) | INTRAVENOUS | Status: DC | PRN

## 2023-02-01 MED ORDER — BISACODYL 10 MG RE SUPP: 10.0000 mg | Freq: Every day | RECTAL | Status: DC | PRN

## 2023-02-01 MED ORDER — LACTATED RINGERS IV SOLN: INTRAVENOUS | Status: DC

## 2023-02-01 MED ORDER — AMPHETAMINE-DEXTROAMPHET ER 30 MG PO CP24: 30.0000 mg | ORAL_CAPSULE | Freq: Every day | ORAL | Status: DC | PRN

## 2023-02-01 MED ORDER — DOCUSATE SODIUM 100 MG PO CAPS
100.0000 mg | ORAL_CAPSULE | Freq: Two times a day (BID) | ORAL | Status: DC
  Administered 2023-02-01 – 2023-02-02 (×2): 100 mg via ORAL
  Filled 2023-02-01 (×2): qty 1

## 2023-02-01 MED ORDER — LACTATED RINGERS IV BOLUS
1000.0000 mL | Freq: Once | INTRAVENOUS | Status: AC
  Administered 2023-02-01: 1000 mL via INTRAVENOUS

## 2023-02-01 MED ORDER — SODIUM ZIRCONIUM CYCLOSILICATE 10 G PO PACK
10.0000 g | PACK | Freq: Once | ORAL | Status: AC
  Administered 2023-02-01: 10 g via ORAL
  Filled 2023-02-01: qty 1

## 2023-02-01 MED ORDER — ONDANSETRON HCL 4 MG PO TABS: 4.0000 mg | ORAL_TABLET | Freq: Four times a day (QID) | ORAL | Status: DC | PRN

## 2023-02-01 MED ORDER — ONDANSETRON HCL 4 MG/2ML IJ SOLN: 4.0000 mg | Freq: Four times a day (QID) | INTRAMUSCULAR | Status: DC | PRN

## 2023-02-01 MED ORDER — ASPIRIN 81 MG PO TBEC
81.0000 mg | DELAYED_RELEASE_TABLET | Freq: Every day | ORAL | Status: DC
  Administered 2023-02-02: 81 mg via ORAL
  Filled 2023-02-01: qty 1

## 2023-02-01 MED ORDER — POLYETHYLENE GLYCOL 3350 17 G PO PACK
17.0000 g | PACK | Freq: Every day | ORAL | Status: DC
  Administered 2023-02-02: 17 g via ORAL
  Filled 2023-02-01: qty 1

## 2023-02-01 MED ORDER — MORPHINE SULFATE (PF) 4 MG/ML IV SOLN
6.0000 mg | Freq: Once | INTRAVENOUS | Status: AC
  Administered 2023-02-01: 6 mg via INTRAVENOUS
  Filled 2023-02-01: qty 2

## 2023-02-01 MED ORDER — SODIUM ZIRCONIUM CYCLOSILICATE 10 G PO PACK
10.0000 g | PACK | Freq: Three times a day (TID) | ORAL | Status: DC
  Administered 2023-02-01 – 2023-02-02 (×2): 10 g via ORAL
  Filled 2023-02-01 (×3): qty 1

## 2023-02-01 MED ORDER — HYDRALAZINE HCL 20 MG/ML IJ SOLN
10.0000 mg | Freq: Four times a day (QID) | INTRAMUSCULAR | Status: DC | PRN
  Administered 2023-02-01: 10 mg via INTRAVENOUS
  Filled 2023-02-01: qty 1

## 2023-02-01 MED ORDER — HYDROCODONE-ACETAMINOPHEN 5-325 MG PO TABS: 1.0000 | ORAL_TABLET | ORAL | Status: DC | PRN

## 2023-02-01 MED ORDER — ACETAMINOPHEN 650 MG RE SUPP: 650.0000 mg | Freq: Four times a day (QID) | RECTAL | Status: DC | PRN

## 2023-02-01 MED ORDER — METOPROLOL SUCCINATE ER 50 MG PO TB24
100.0000 mg | ORAL_TABLET | Freq: Every day | ORAL | Status: DC
  Administered 2023-02-02: 100 mg via ORAL
  Filled 2023-02-01: qty 2

## 2023-02-01 MED ORDER — SODIUM CHLORIDE 0.9 % IV SOLN: INTRAVENOUS | Status: DC

## 2023-02-01 MED ORDER — ACETAMINOPHEN 325 MG PO TABS: 650.0000 mg | ORAL_TABLET | Freq: Four times a day (QID) | ORAL | Status: DC | PRN

## 2023-02-01 MED ORDER — HYDROMORPHONE HCL 1 MG/ML IJ SOLN: 0.5000 mg | INTRAMUSCULAR | Status: DC | PRN

## 2023-02-01 MED ORDER — ATORVASTATIN CALCIUM 40 MG PO TABS
80.0000 mg | ORAL_TABLET | Freq: Every day | ORAL | Status: DC
  Administered 2023-02-02: 80 mg via ORAL
  Filled 2023-02-01: qty 2

## 2023-02-01 MED ORDER — APIXABAN 5 MG PO TABS
5.0000 mg | ORAL_TABLET | Freq: Two times a day (BID) | ORAL | Status: DC
  Administered 2023-02-01 – 2023-02-02 (×2): 5 mg via ORAL
  Filled 2023-02-01 (×2): qty 1

## 2023-02-01 NOTE — ED Notes (Signed)
ED TO INPATIENT HANDOFF REPORT  Name/Age/Gender [name]. 52 y.o. male  Code Status Code Status History     Date Active Date Inactive Code Status Order ID Comments User Context   04/14/2021 2020 04/16/2021 2220 Full Code [id number]  Doran Stabler, DO ED   11/19/2020 1407 11/21/2020 2130 Full Code [id number]  Versie Starks, DO Inpatient   06/01/2020 2355 06/02/2020 2251 Full Code [id number]  Rometta Emery, MD Inpatient   11/27/2019 1056 11/27/2019 2145 Full Code [id number]  [name], MD Inpatient   09/30/2019 0223 09/30/2019 1956 Full Code [id number]  Eduard Clos, MD ED   01/30/2019 0848 02/01/2019 1848 Full Code [id number]  Leatha Gilding, MD Inpatient   01/30/2019 0557 01/30/2019 0848 Full Code [id number]  Howerter, Chaney Born, DO ED   01/22/2019 0845 01/24/2019 1428 Full Code [id number]  Noralee Stain, DO Inpatient   01/23/2013 1108 01/23/2013 1935 Full Code [id number]  Laveda Norman, MD ED       Home/SNF/Other Home  Chief Complaint AKI (acute kidney injury) [N17.9]  Level of Care/Admitting Diagnosis ED Disposition     ED Disposition  Admit   Condition  --   Comment  Hospital Area: [facility] [[id number]]  Level of Care: Telemetry [5]  Admit to tele based on following criteria: Monitor QTC interval  May place patient in observation at [facility] or [facility] if equivalent level of care is available:: Yes  Covid Evaluation: Asymptomatic - no recent exposure (last 10 days) testing not required  Diagnosis: AKI (acute kidney injury) [518841]  Admitting Physician: Marguerita Merles LATIF [[id number]]  Attending Physician: Marguerita Merles LATIF [[id number]]          Medical History Past Medical History:  Diagnosis Date   Diabetes mellitus    GERD (gastroesophageal reflux disease)    History of alcohol abuse    History of cocaine use    History of echocardiogram    a. Echo 4/14: Moderate LVH, vigorous LVEF, EF 65-70%, normal wall motion, grade 2  diastolic dysfunction, mildly dilated aortic root and ascending aorta, ascending aorta 40 mm, aortic root 38 mm, mild LAE   History of pancreatitis 01/22/2019   Hx of cardiovascular stress test    a. GXT 5/14: No ischemic changes  //  b. ETT-Myoview 3/16:  Low risk, no ischemia, EF 58%   Hyperlipidemia    Hypertension    Morbid obesity    Paroxysmal atrial fibrillation    Occurring in 2008, with several recurrence since then (including in the setting of + cocaine on UDS).   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 SVT 09/2019    Allergies Allergies  Allergen Reactions   Shrimp [Shellfish Allergy] Anaphylaxis   Banana Other (See Comments)    Pt gags   Watermelon Flavor Nausea And Vomiting   Almond Oil Itching    Roof of mouth itches   Other Itching    Grapes cause itching   Peanut-Containing Drug Products Itching and Cough   Sulfa Antibiotics Itching    IV Location/Drains/Wounds Patient Lines/Drains/Airways Status     Active Line/Drains/Airways     Name Placement date Placement time Site Days   Peripheral IV 02/01/23 Left;Posterior Hand 02/01/23  1050  Hand  less than 1            Labs/Imaging Results for orders placed or performed  during the hospital encounter of 02/01/23 (from the past 48 hour(s))  CBC with Differential/Platelet     Status: Abnormal   Collection Time: 02/01/23 10:43 AM  Result Value Ref Range   WBC 10.3 4.0 - 10.5 K/uL   RBC 5.58 4.22 - 5.81 MIL/uL   Hemoglobin 15.5 13.0 - 17.0 g/dL   HCT 16.1 09.6 - 04.5 %   MCV 85.7 80.0 - 100.0 fL   MCH 27.8 26.0 - 34.0 pg   MCHC 32.4 30.0 - 36.0 g/dL   RDW 40.9 81.1 - 91.4 %   Platelets PLATELET CLUMPS NOTED ON SMEAR, UNABLE TO ESTIMATE 150 - 400 K/uL   nRBC 0.0 0.0 - 0.2 %   Neutrophils Relative % 77 %   Neutro Abs 7.9 (H) 1.7 - 7.7 K/uL   Lymphocytes Relative 10 %   Lymphs Abs 1.0 0.7 - 4.0 K/uL   Monocytes Relative 8 %   Monocytes Absolute  0.8 0.1 - 1.0 K/uL   Eosinophils Relative 4 %   Eosinophils Absolute 0.4 0.0 - 0.5 K/uL   Basophils Relative 1 %   Basophils Absolute 0.1 0.0 - 0.1 K/uL   Immature Granulocytes 0 %   Abs Immature Granulocytes 0.03 0.00 - 0.07 K/uL    Comment: Performed at [facility], [address] [place], [place] [address]  Urinalysis, Routine w reflex microscopic -Urine, Clean Catch     Status: Abnormal   Collection Time: 02/01/23 10:43 AM  Result Value Ref Range   Color, Urine YELLOW YELLOW   APPearance CLEAR CLEAR   Specific Gravity, Urine 1.025 1.005 - 1.030   pH 5.0 5.0 - 8.0   Glucose, UA >=500 (A) NEGATIVE mg/dL   Hgb urine dipstick SMALL (A) NEGATIVE   Bilirubin Urine NEGATIVE NEGATIVE   Ketones, ur NEGATIVE NEGATIVE mg/dL   Protein, ur 621 (A) NEGATIVE mg/dL   Nitrite NEGATIVE NEGATIVE   Leukocytes,Ua NEGATIVE NEGATIVE   RBC / HPF 0-5 0 - 5 RBC/hpf   WBC, UA 0-5 0 - 5 WBC/hpf   Bacteria, UA NONE SEEN NONE SEEN   Squamous Epithelial / HPF 0-5 0 - 5 /HPF   Ca Oxalate Crys, UA PRESENT     Comment: Performed at [facility], [place], [address] [place], [place] [address]  Comprehensive metabolic panel     Status: Abnormal   Collection Time: 02/01/23 12:00 PM  Result Value Ref Range   Sodium 137 135 - 145 mmol/L   Potassium 6.1 (H) 3.5 - 5.1 mmol/L    Comment: HEMOLYSIS AT THIS LEVEL MAY AFFECT RESULT   Chloride 107 98 - 111 mmol/L   CO2 23 22 - 32 mmol/L   Glucose, Bld 186 (H) 70 - 99 mg/dL    Comment: Glucose reference range applies only to samples taken after fasting for at least 8 hours.   BUN 37 (H) 6 - 20 mg/dL   Creatinine, Ser 7.84 (H) 0.61 - 1.24 mg/dL   Calcium 8.4 (L) 8.9 - 10.3 mg/dL   Total Protein 8.4 (H) 6.5 - 8.1 g/dL   Albumin 3.9 3.5 - 5.0 g/dL   AST 39 15 - 41 U/L    Comment: HEMOLYSIS AT THIS LEVEL MAY AFFECT RESULT   ALT 41 0 - 44 U/L    Comment: HEMOLYSIS AT THIS LEVEL MAY AFFECT RESULT   Alkaline Phosphatase 81 38 - 126  U/L   Total Bilirubin 1.1 0.3 - 1.2 mg/dL    Comment: HEMOLYSIS AT THIS  LEVEL MAY AFFECT RESULT   GFR, Estimated 43 (L) >60 mL/min    Comment: (NOTE) Calculated using the CKD-EPI Creatinine Equation (2021)    Anion gap 7 5 - 15    Comment: Performed at [facility], [address], [place], [place] [address]  Lipase, blood     Status: None   Collection Time: 02/01/23 12:00 PM  Result Value Ref Range   Lipase 41 11 - 51 U/L    Comment: Performed at [facility], [address] [place], [place] [address]  Potassium     Status: Abnormal   Collection Time: 02/01/23  3:34 PM  Result Value Ref Range   Potassium 5.4 (H) 3.5 - 5.1 mmol/L    Comment: Performed at [facility], [address], [place], [place] [address]   CT ABDOMEN PELVIS WO CONTRAST  Result Date: 02/01/2023 CLINICAL DATA:  Low back pain for 1 week. EXAM: CT ABDOMEN AND PELVIS WITHOUT CONTRAST TECHNIQUE: Multidetector CT imaging of the abdomen and pelvis was performed following the standard protocol without IV contrast. RADIATION DOSE REDUCTION: This exam was performed according to the departmental dose-optimization program which includes automated exposure control, adjustment of the mA and/or kV according to patient size and/or use of iterative reconstruction technique. COMPARISON:  CT abdomen/pelvis 01/30/2019 FINDINGS: Lower chest: The lung bases are clear. The heart is mildly enlarged. Hepatobiliary: The liver and gallbladder are unremarkable. There is no biliary ductal dilatation. Pancreas: Unremarkable. Spleen: Unremarkable. Adrenals/Urinary Tract: The adrenals are unremarkable. There are no focal renal lesions, within the confines of noncontrast technique. There are no stones in either kidney or along the course of either ureter. There is no hydronephrosis or hydroureter. There is bilateral perinephric stranding, nonspecific but can be seen in the setting of chronic renal  disease. The bladder is decompressed but grossly unremarkable. Stomach/Bowel: The stomach is unremarkable. There is no evidence of bowel obstruction. There is no abnormal bowel wall thickening or inflammatory change. The appendix is surgically absent. Vascular/Lymphatic: There is mild calcified plaque in the nonaneurysmal abdominal aorta. There is no abdominal or pelvic lymphadenopathy. Reproductive: The prostate and seminal vesicles are unremarkable. Other: There is no ascites or free air. Musculoskeletal: There is no acute osseous abnormality or suspicious osseous lesion. IMPRESSION: No acute finding in the abdomen or pelvis. Electronically Signed   By: Lesia Hausen [name]   On: 02/01/2023 12:26    Pending Labs Unresulted Labs (From admission, onward)     Start     Ordered   02/01/23 1723  Rapid urine drug screen (hospital performed)  Add-on,   AD        02/01/23 1722            Vitals/Pain Today's Vitals   02/01/23 1356 02/01/23 1400 02/01/23 1615 02/01/23 1700  BP:  (!) 179/110 (!) 161/118   Pulse:  77 80   Resp:  16 16   Temp:    98 F (36.7 C)  SpO2:  96% 95%   PainSc: 4        Isolation Precautions No active isolations  Medications Medications  lactated ringers infusion ( Intravenous New Bag/Given 02/01/23 1055)  lactated ringers bolus 1,000 mL (0 mLs Intravenous Stopped 02/01/23 1658)  ondansetron (ZOFRAN) injection 4 mg (4 mg Intravenous Given 02/01/23 1054)  morphine (PF) 4 MG/ML injection 6 mg (6 mg Intravenous Given 02/01/23 1055)  sodium zirconium cyclosilicate (LOKELMA) packet 10 g (10 g Oral Given 02/01/23 1715)  Mobility walks

## 2023-02-01 NOTE — Progress Notes (Signed)
History and Physical    [name]. ZDG:[id number] DOB: 1970-11-07 DOA: 02/01/2023  PCP: Evlyn Kanner, MD  Patient coming from: Home  Chief Complaint: Abdominal Pain, Back Pain, and Vomiting   HPI: [name]. is a 52 y.o. male with medical history significant for but not limited to diabetes mellitus type 2 with a recent hemoglobin A1c of 7.6, GERD, history of alcohol abuse and cocaine use, history of pancreatitis, hypertension, hyperlipidemia, morbid obesity, history of proximal atrial fibrillation on anticoagulation with apixaban, obstructive sleep apnea uses a CPAP as well as history of SVT and other comorbidities who presents to the hospital with abdominal discomfort and pain.  Yesterday the patient presented with back pain he was given a shot of Toradol and was found to have an elevated potassium and so was given Lokelma.  He was discharged home and then again returns today with abdominal discomfort and pain as well as back pain.  Patient states that after he was discharged home he started vomiting all night was vomiting this morning.  He describes no blood in his vomit.  Denies chest pain or shortness of breath.  States his abdominal discomfort is better after he had passed some gas.  Continues to have some back pain and states that it is getting better but still persists and he states is worse on the left side.  No nausea or vomiting.  Upon arrival to the ED he is found to have an AKI worsening creatinine as well as hyperkalemia.  The EDP called his nephrologist who recommended observation for the patient overnight with IV fluid hydration and Lokelma and patient will be seen by the nephrology team in the morning.  [facility] was asked to admit this patient for AKI and hyperkalemia as well as back pain.  ED Course: In the ED he had basic blood work done and was given [employer] as well as fluid hydration.  Given 1 L lactated ringer bolus, morphine, Zofran, Lokelma.  Patient  is abdominal discomfort improved and nausea vomiting resolved however he continues some back pain and discomfort.  Review of Systems: As per HPI otherwise all other systems reviewed and negative.   Past Medical History:  Diagnosis Date   Diabetes mellitus    GERD (gastroesophageal reflux disease)    History of alcohol abuse    History of cocaine use    History of echocardiogram    a. Echo 4/14: Moderate LVH, vigorous LVEF, EF 65-70%, normal wall motion, grade 2 diastolic dysfunction, mildly dilated aortic root and ascending aorta, ascending aorta 40 mm, aortic root 38 mm, mild LAE   History of pancreatitis 01/22/2019   Hx of cardiovascular stress test    a. GXT 5/14: No ischemic changes  //  b. ETT-Myoview 3/16:  Low risk, no ischemia, EF 58%   Hyperlipidemia    Hypertension    Morbid obesity    Paroxysmal atrial fibrillation    Occurring in 2008, with several recurrence since then (including in the setting of + cocaine on UDS).   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 SVT 09/2019   Past Surgical History:  Procedure Laterality Date   APPENDECTOMY     ATRIAL FIBRILLATION ABLATION N/A 11/27/2019   Procedure: ATRIAL FIBRILLATION ABLATION;  Surgeon: [name], MD;  Location: [facility] INVASIVE CV [facility];  Service: Cardiovascular;  Laterality: N/A;   BUBBLE STUDY  05/20/2021  Procedure: BUBBLE STUDY;  Surgeon: Little Ishikawa, MD;  Location: [facility] ENDOSCOPY;  Service: Cardiovascular;;   ROTATOR CUFF REPAIR     SVT ABLATION N/A 11/27/2019   Procedure: SVT ABLATION;  Surgeon: [name], MD;  Location: [facility] INVASIVE CV LAB;  Service: Cardiovascular;  Laterality: N/A;   TEE WITHOUT CARDIOVERSION N/A 05/20/2021   Procedure: TRANSESOPHAGEAL ECHOCARDIOGRAM (TEE);  Surgeon: Little Ishikawa, MD;  Location: [facility] ENDOSCOPY;  Service: Cardiovascular;  Laterality: N/A;   TONSILLECTOMY     SOCIAL HISTORY  reports that he quit smoking  about 3 months ago. His smoking use included cigarettes. He has a 14.00 pack-year smoking history. He has quit using smokeless tobacco.  His smokeless tobacco use included snuff. He reports current alcohol use of about 6.0 standard drinks of alcohol per week. He reports that he does not currently use drugs.  Allergies  Allergen Reactions   Shrimp [Shellfish Allergy] Anaphylaxis   Banana Other (See Comments)    Pt gags   Watermelon Flavor Nausea And Vomiting   Almond Oil Itching    Roof of mouth itches   Other Itching    Grapes cause itching   Peanut-Containing Drug Products Itching and Cough   Sulfa Antibiotics Itching    Family History  Problem Relation Age of Onset   Diabetes Mother    Heart attack Father 14   Stroke Maternal Grandmother    Stroke Paternal Grandmother    Diabetes Paternal Grandfather     Prior to Admission medications   Medication Sig Start Date End Date Taking? Authorizing Provider  Accu-Chek Softclix Lancets lancets TEST UP TO 4 TIMES A DAY 03/09/22   Ellison Carwin, MD  amLODipine (NORVASC) 10 MG tablet TAKE 1 TABLET BY MOUTH EVERY DAY 07/05/22   Rocky Morel, DO  amphetamine-dextroamphetamine (ADDERALL XR) 30 MG 24 hr capsule Take 1 capsule (30 mg total) by mouth daily as needed (focus). 1 daily as needed 01/16/23   Jetty Duhamel D, MD  apixaban (ELIQUIS) 5 MG TABS tablet Take 1 tablet (5 mg total) by mouth 2 (two) times daily. 09/06/22   Rocky Morel, DO  aspirin 81 MG EC tablet Take 1 tablet (81 mg total) by mouth daily. Swallow whole. 08/04/21   Steffanie Rainwater, MD  atorvastatin (LIPITOR) 80 MG tablet TAKE 1 TABLET BY MOUTH EVERY DAY 01/04/23   Evlyn Kanner, MD  blood glucose meter kit and supplies KIT Dispense based on patient and insurance preference. Use up to four times daily as directed. 12/29/20   Evlyn Kanner, MD  Blood Pressure Monitoring (BLOOD PRESSURE MONITOR/L CUFF) MISC 1 Units by Does not apply route daily. 03/19/19   Jeannie Fend, PA-C  cetirizine (ZYRTEC ALLERGY) 10 MG tablet Take 1 tablet (10 mg total) by mouth daily. 12/11/22 09/07/23  Evlyn Kanner, MD  diclofenac Sodium (VOLTAREN) 1 % GEL Apply 4 g topically 4 (four) times daily. 01/31/23   Kommor, Madison, MD  empagliflozin (JARDIANCE) 25 MG TABS tablet TAKE 1 TABLET (25 MG TOTAL) BY MOUTH DAILY. 12/01/22   Evlyn Kanner, MD  flecainide (TAMBOCOR) 100 MG tablet Take 1 tablet (100 mg total) by mouth 2 (two) times daily. 01/04/23   Graciella Freer, PA-C  glucose blood (ACCU-CHEK GUIDE) test strip Please use strips to test morning fasting sugars and one hour after each meal, total 4x daily 03/10/22   Ellison Carwin, MD  lidocaine (LIDODERM) 5 % Place 1 patch onto the skin daily. Remove & Discard patch within 12 hours  or as directed by MD 01/31/23   Kommor, Wyn Forster, MD  metFORMIN (GLUCOPHAGE) 1000 MG tablet TAKE 1 TABLET (1,000 MG TOTAL) BY MOUTH TWICE A DAY WITH FOOD 06/05/22   Evlyn Kanner, MD  metoprolol succinate (TOPROL-XL) 100 MG 24 hr tablet TAKE 1 TABLET BY MOUTH DAILY. TAKE WITH OR IMMEDIATELY FOLLOWING A MEAL. 03/02/22   Evlyn Kanner, MD  nicotine (NICODERM CQ - DOSED IN MG/24 HOURS) 21 mg/24hr patch PLACE 1 PATCH (21 MG TOTAL) ONTO THE SKIN DAILY. 12/20/20   Doran Stabler, DO  olmesartan (BENICAR) 20 MG tablet TAKE 1 TABLET BY MOUTH EVERY DAY 11/09/22   Evlyn Kanner, MD  pantoprazole (PROTONIX) 40 MG tablet TAKE 1 TABLET BY MOUTH EVERY DAY 09/04/22   Nooruddin, Jason Fila, MD  polyethylene glycol (MIRALAX) 17 g packet Take 17 g by mouth daily. 08/17/21   Evlyn Kanner, MD  tirzepatide [facility]) 5 MG/0.5ML Pen Inject 5 mg into the skin once a week. 09/06/22   Rocky Morel, DO    Physical Exam: Vitals:   02/01/23 1400 02/01/23 1615 02/01/23 1700  BP: (!) 179/110 (!) 161/118   Pulse: 77 80   Resp: 16 16   Temp:   98 F (36.7 C)  SpO2: 96% 95%    Constitutional: WN/WD morbidly obese African-American male appears calm sitting in the  chair Respiratory: Diminished to auscultation bilaterally with coarse breath sounds, no wheezing, rales, rhonchi or crackles. Normal respiratory effort and patient is not tachypenic. No accessory muscle use.  Unlabored breathing Cardiovascular: RRR, no murmurs / rubs / gallops. S1 and S2 auscultated.  Trace extremity edema Abdomen: Soft, non-tender, distended secondary to body habitus. Bowel sounds positive.  GU: Deferred. Musculoskeletal: No clubbing / cyanosis of digits/nails. No joint deformity upper and lower extremities.  Skin: No rashes, lesions, ulcers limited skin evaluation. No induration; Warm and dry.  Neurologic: CN 2-12 grossly intact with no focal deficits. Romberg sign and cerebellar reflexes not assessed.  Psychiatric: Normal judgment and insight. Alert and oriented x 3. Normal mood and appropriate affect.   Labs on Admission: I have personally reviewed following labs and imaging studies  CBC: Recent Labs  Lab 01/30/23 1017 01/31/23 1409 02/01/23 1043  WBC 7.9 6.9 10.3  NEUTROABS  --   --  7.9*  HGB 14.8 14.4 15.5  HCT 43.8 44.5 47.8  MCV 83 85.4 85.7  PLT 311 309 PLATELET CLUMPS NOTED ON SMEAR, UNABLE TO ESTIMATE   Basic Metabolic Panel: Recent Labs  Lab 01/30/23 1015 01/31/23 1409 02/01/23 1200 02/01/23 1534  NA 140 136 137  --   K 5.8* 5.0 6.1* 5.4*  CL 105 105 107  --   CO2 --   GLUCOSE 131* 157* 186*  --   BUN 24 27* 37*  --   CREATININE 1.53* 1.58* 1.85*  --   CALCIUM 9.0 9.2 8.4*  --    GFR: Estimated Creatinine Clearance: 63.3 mL/min (A) (by C-G formula based on SCr of 1.85 mg/dL (H)). Liver Function Tests: Recent Labs  Lab 02/01/23 1200  AST 39  ALT 41  ALKPHOS 81  BILITOT 1.1  PROT 8.4*  ALBUMIN 3.9   Recent Labs  Lab 02/01/23 1200  LIPASE 41   No results for input(s): "AMMONIA" in the last 168 hours. Coagulation Profile: No results for input(s): "INR", "PROTIME" in the last 168 hours. Cardiac Enzymes: No results for  input(s): "CKTOTAL", "CKMB", "CKMBINDEX", "TROPONINI" in the last 168 hours. BNP (last 3 results) No results  for input(s): "PROBNP" in the last 8760 hours. HbA1C: No results for input(s): "HGBA1C" in the last 72 hours. CBG: No results for input(s): "GLUCAP" in the last 168 hours. Lipid Profile: Recent Labs    01/30/23 1017  CHOL 139  HDL 34*  LDLCALC 67  TRIG 161*  CHOLHDL 4.1   Thyroid Function Tests: No results for input(s): "TSH", "T4TOTAL", "FREET4", "T3FREE", "THYROIDAB" in the last 72 hours. Anemia Panel: No results for input(s): "VITAMINB12", "FOLATE", "FERRITIN", "TIBC", "IRON", "RETICCTPCT" in the last 72 hours. Urine analysis:    Component Value Date/Time   COLORURINE YELLOW 02/01/2023 1043   APPEARANCEUR CLEAR 02/01/2023 1043   APPEARANCEUR Clear 03/09/2022 1003   LABSPEC 1.025 02/01/2023 1043   PHURINE 5.0 02/01/2023 1043   GLUCOSEU >=500 (A) 02/01/2023 1043   HGBUR SMALL (A) 02/01/2023 1043   BILIRUBINUR NEGATIVE 02/01/2023 1043   BILIRUBINUR Negative 03/09/2022 1003   KETONESUR NEGATIVE 02/01/2023 1043   PROTEINUR 100 (A) 02/01/2023 1043   UROBILINOGEN 1.0 11/19/2013 1855   NITRITE NEGATIVE 02/01/2023 1043   LEUKOCYTESUR NEGATIVE 02/01/2023 1043   Sepsis Labs: !!!!!!!!!!!!!!!!!!!!!!!!!!!!!!!!!!!!!!!!!!!! @LABRCNTIP (procalcitonin:4,lacticidven:4) )No results found for this or any previous visit (from the past 240 hour(s)).   Radiological Exams on Admission: CT ABDOMEN PELVIS WO CONTRAST  Result Date: 02/01/2023 CLINICAL DATA:  Low back pain for 1 week. EXAM: CT ABDOMEN AND PELVIS WITHOUT CONTRAST TECHNIQUE: Multidetector CT imaging of the abdomen and pelvis was performed following the standard protocol without IV contrast. RADIATION DOSE REDUCTION: This exam was performed according to the departmental dose-optimization program which includes automated exposure control, adjustment of the mA and/or kV according to patient size and/or use of iterative  reconstruction technique. COMPARISON:  CT abdomen/pelvis 01/30/2019 FINDINGS: Lower chest: The lung bases are clear. The heart is mildly enlarged. Hepatobiliary: The liver and gallbladder are unremarkable. There is no biliary ductal dilatation. Pancreas: Unremarkable. Spleen: Unremarkable. Adrenals/Urinary Tract: The adrenals are unremarkable. There are no focal renal lesions, within the confines of noncontrast technique. There are no stones in either kidney or along the course of either ureter. There is no hydronephrosis or hydroureter. There is bilateral perinephric stranding, nonspecific but can be seen in the setting of chronic renal disease. The bladder is decompressed but grossly unremarkable. Stomach/Bowel: The stomach is unremarkable. There is no evidence of bowel obstruction. There is no abnormal bowel wall thickening or inflammatory change. The appendix is surgically absent. Vascular/Lymphatic: There is mild calcified plaque in the nonaneurysmal abdominal aorta. There is no abdominal or pelvic lymphadenopathy. Reproductive: The prostate and seminal vesicles are unremarkable. Other: There is no ascites or free air. Musculoskeletal: There is no acute osseous abnormality or suspicious osseous lesion. IMPRESSION: No acute finding in the abdomen or pelvis. Electronically Signed   By: Lesia Hausen [name]   On: 02/01/2023 12:26    EKG: Independently reviewed.  Showed normal sinus rhythm at a rate of 75 with T wave inversions in lead III left anterior fascicular block.  Patient had a QTc of 433.  There is no real evidence of ST elevation on my interpretation.  Assessment/Plan Principal Problem:   AKI (acute kidney injury)  AKI on CKD Stage 3a -Placed in observation telemetry -BUN/Cr Trend: Recent Labs  Lab 01/30/23 1015 01/31/23 1409 02/01/23 1200  BUN 24 27* 37*  CREATININE 1.53* 1.58* 1.85*  -Given a 1 L bolus in the ED and then was placed on lactated Ringer's at 125 MLS per hour but per  nephrology recommendations will change to 75 MLS  per hour for normal saline -Pursue a urinalysis workup and obtain a UDS and renal ultrasound, urine osmolality, urine sodium and urine creatinine -Avoid Nephrotoxic Medications (holding home ARB), Contrast Dyes, Hypotension and Dehydration to Ensure Adequate Renal Perfusion and will need to Renally Adjust Meds -Continue to Monitor and Trend Renal Function carefully and repeat CMP in the AM   Hyperkalemia -K+ Trend: Recent Labs  Lab 01/30/23 1015 01/31/23 1409 02/01/23 1200 02/01/23 1534  K 5.8* 5.0 6.1* 5.4*  -Given Lokelma in the ED and per nephrology will continue Lokelma 3 times daily and initiate IV fluid hydration at 75 MLS per hour -Continue to monitor on telemetry -Further care per nephrology  Diabetes Mellitus Type 2 -Recent hemoglobin A1c was 7.6 -Hold oral medications and placed on moderate NovoLog sliding scale insulin before meals and at bedtime -Order CBGs per protocol  Hypertriglyceridemia  -Noted and will need dietary changes  Abdominal Pain -Unclear etiology -CT scan of the abdomen pelvis without contrast done and showed no acute findings in the abdomen and pelvis -Continue with analgesics and bowel regimen -Continue PPI -Patient thought it may have been secondary to gas pain and is resolved and if necessary will place on Gas-X  Back Pain -Continue lidocaine patch and unclear etiology -Will obtain a DG x-ray first of his lumbar thoracic spine and then consider further testing  Hypertension -Continue home meds but hold amlodipine and olmesartan -Continue monitor blood pressures per protocol -Added IV hydralazine for systolic blood pressure greater than 180 or diastolic blood pressure 100 -Continue monitor blood pressures per protocol  Paroxysmal atrial fibrillation -Continue with beta-blocker and anticoagulation and monitor on telemetry -Continue with home flecainide 100 mg p.o. twice daily and continue  monitor electrolytes  History of narcolepsy -Continue with Adderall XR 30 mg daily as needed  Morbid Obesity -Complicates overall prognosis and care -Estimated body mass index is 43.42 kg/m as calculated from the following:   Height as of 01/30/23:  (1.753 m).   Weight as of 01/30/23: 133.4 kg.  -Weight Loss and Dietary Counseling given  Tobacco abuse -Smoking cessation counseling given and will continue nicotine patch 21 mg transdermally  GERD/GI prophylaxis -Continue home PPI with pantoprazole fluids and p.o. daily and may need to increase to twice daily  History of substance abuse -Check UDS  DVT prophylaxis: Anticoagulated with apixaban Code Status: Full code Family Communication: No family at bedside Disposition Plan: Pending further clinical improvement and clearance by nephrology Consults called: Nephrology Admission status: Observation telemetry  [facility], D.O. [facility] PAGER is on AMION  If 7PM-7AM, please contact night-coverage [email or web address]  02/01/2023, 5:52 PM

## 2023-02-01 NOTE — ED Provider Notes (Addendum)
[place] [facility] AT [facility] Provider Note   CSN: [id number] Arrival date & time: 02/01/23  9528     History  No chief complaint on file.   Douglas Tall Joshoa Shawler. is a 52 y.o. male.  52 year old male presents with abdominal pain which began this morning.  Patient seen yesterday for back pain and treated with anti-inflammatories as well as lidocaine patch.  States that the days had sharp stabbing pain going across his lower abdominal region.  No fever or chills.  Patient has had emesis x 2.  Denies any urinary symptoms.  States that nothing makes his abdominal pain better or worse.  No prior history of same.  No treatment use prior to arrival.  Presents via EMS      Home Medications Prior to Admission medications   Medication Sig Start Date End Date Taking? Authorizing Provider  Accu-Chek Softclix Lancets lancets TEST UP TO 4 TIMES A DAY 03/09/22   Ellison Carwin, MD  amLODipine (NORVASC) 10 MG tablet TAKE 1 TABLET BY MOUTH EVERY DAY 07/05/22   Rocky Morel, DO  amphetamine-dextroamphetamine (ADDERALL XR) 30 MG 24 hr capsule Take 1 capsule (30 mg total) by mouth daily as needed (focus). 1 daily as needed 01/16/23   Jetty Duhamel D, MD  apixaban (ELIQUIS) 5 MG TABS tablet Take 1 tablet (5 mg total) by mouth 2 (two) times daily. 09/06/22   Rocky Morel, DO  aspirin 81 MG EC tablet Take 1 tablet (81 mg total) by mouth daily. Swallow whole. 08/04/21   Steffanie Rainwater, MD  atorvastatin (LIPITOR) 80 MG tablet TAKE 1 TABLET BY MOUTH EVERY DAY 01/04/23   Evlyn Kanner, MD  blood glucose meter kit and supplies KIT Dispense based on patient and insurance preference. Use up to four times daily as directed. 12/29/20   Evlyn Kanner, MD  Blood Pressure Monitoring (BLOOD PRESSURE MONITOR/L CUFF) MISC 1 Units by Does not apply route daily. 03/19/19   Jeannie Fend, PA-C  cetirizine (ZYRTEC ALLERGY) 10 MG tablet Take 1 tablet (10 mg total) by mouth daily.  12/11/22 09/07/23  Evlyn Kanner, MD  diclofenac Sodium (VOLTAREN) 1 % GEL Apply 4 g topically 4 (four) times daily. 01/31/23   Kommor, Madison, MD  empagliflozin (JARDIANCE) 25 MG TABS tablet TAKE 1 TABLET (25 MG TOTAL) BY MOUTH DAILY. 12/01/22   Evlyn Kanner, MD  flecainide (TAMBOCOR) 100 MG tablet Take 1 tablet (100 mg total) by mouth 2 (two) times daily. 01/04/23   Graciella Freer, PA-C  glucose blood (ACCU-CHEK GUIDE) test strip Please use strips to test morning fasting sugars and one hour after each meal, total 4x daily 03/10/22   Ellison Carwin, MD  lidocaine (LIDODERM) 5 % Place 1 patch onto the skin daily. Remove & Discard patch within 12 hours or as directed by MD 01/31/23   Kommor, Wyn Forster, MD  metFORMIN (GLUCOPHAGE) 1000 MG tablet TAKE 1 TABLET (1,000 MG TOTAL) BY MOUTH TWICE A DAY WITH FOOD 06/05/22   Evlyn Kanner, MD  metoprolol succinate (TOPROL-XL) 100 MG 24 hr tablet TAKE 1 TABLET BY MOUTH DAILY. TAKE WITH OR IMMEDIATELY FOLLOWING A MEAL. 03/02/22   Evlyn Kanner, MD  nicotine (NICODERM CQ - DOSED IN MG/24 HOURS) 21 mg/24hr patch PLACE 1 PATCH (21 MG TOTAL) ONTO THE SKIN DAILY. 12/20/20   Doran Stabler, DO  olmesartan (BENICAR) 20 MG tablet TAKE 1 TABLET BY MOUTH EVERY DAY 11/09/22   Evlyn Kanner, MD  pantoprazole (PROTONIX) 40 MG tablet TAKE  1 TABLET BY MOUTH EVERY DAY 09/04/22   Nooruddin, Jason Fila, MD  polyethylene glycol (MIRALAX) 17 g packet Take 17 g by mouth daily. 08/17/21   Evlyn Kanner, MD  tirzepatide [facility]) 5 MG/0.5ML Pen Inject 5 mg into the skin once a week. 09/06/22   Rocky Morel, DO      Allergies    Shrimp [shellfish allergy], Banana, Watermelon flavor, Almond oil, Other, Peanut-containing drug products, and Sulfa antibiotics    Review of Systems   Review of Systems  All other systems reviewed and are negative.   Physical Exam Updated Vital Signs There were no vitals taken for this visit. Physical Exam Vitals and nursing note  reviewed.  Constitutional:      General: He is not in acute distress.    Appearance: Normal appearance. He is well-developed. He is not toxic-appearing.  HENT:     Head: Normocephalic and atraumatic.  Eyes:     General: Lids are normal.     Conjunctiva/sclera: Conjunctivae normal.     Pupils: Pupils are equal, round, and reactive to light.  Neck:     Thyroid: No thyroid mass.     Trachea: No tracheal deviation.  Cardiovascular:     Rate and Rhythm: Normal rate and regular rhythm.     Heart sounds: Normal heart sounds. No murmur heard.    No gallop.  Pulmonary:     Effort: Pulmonary effort is normal. No respiratory distress.     Breath sounds: Normal breath sounds. No stridor. No decreased breath sounds, wheezing, rhonchi or rales.  Abdominal:     General: There is no distension.     Palpations: Abdomen is soft.     Tenderness: There is no abdominal tenderness. There is no rebound.    Musculoskeletal:        General: No tenderness. Normal range of motion.     Cervical back: Normal range of motion and neck supple.       Back:  Skin:    General: Skin is warm and dry.     Findings: No abrasion or rash.  Neurological:     Mental Status: He is alert and oriented to person, place, and time. Mental status is at baseline.     GCS: GCS eye subscore is 4. GCS verbal subscore is 5. GCS motor subscore is 6.     Cranial Nerves: No cranial nerve deficit.     Sensory: No sensory deficit.     Motor: Motor function is intact.  Psychiatric:        Attention and Perception: Attention normal.        Speech: Speech normal.        Behavior: Behavior normal.    ED Results / Procedures / Treatments   Labs (all labs ordered are listed, but only abnormal results are displayed) Labs Reviewed  CBC WITH DIFFERENTIAL/PLATELET  COMPREHENSIVE METABOLIC PANEL  LIPASE, BLOOD  URINALYSIS, ROUTINE W REFLEX MICROSCOPIC    EKG None  Radiology No results found.  Procedures Procedures     Medications Ordered in ED Medications  lactated ringers bolus 1,000 mL (has no administration in time range)  lactated ringers infusion (has no administration in time range)  ondansetron (ZOFRAN) injection 4 mg (has no administration in time range)  morphine (PF) 4 MG/ML injection 6 mg (has no administration in time range)    ED Course/ Medical Decision Making/ A&P  Medical Decision Making Amount and/or Complexity of Data Reviewed Labs: ordered. Radiology: ordered.  Risk Prescription drug management.   Patient here complaining of abdominal discomfort.  Abdominal CT showed no acute findings.  Patient's potassium was hemolyzed on his electrolytes and this was repeated and is 5.4.  Patient was seen recently for similar symptoms and and given Lokelma.  His Lokelma will be repeated here.  Does have a history of chronic kidney disease and creatinine is 1.85.    4:51 PM Discussed with nephrology, Dr. Chales Abrahams, recommends admission for observation.  Patient will need a urine sodium, urine creatinine, urinalysis, renal diet, renal ultrasound and he recommends Lokelma 10 mg 3 times daily and saline at 75 cc an hour.  He will see the patient tomorrow  CRITICAL CARE Performed by: Toy Baker Total critical care time: 45 minutes Critical care time was exclusive of separately billable procedures and treating other patients. Critical care was necessary to treat or prevent imminent or life-threatening deterioration. Critical care was time spent personally by me on the following activities: development of treatment plan with patient and/or surrogate as well as nursing, discussions with consultants, evaluation of patient's response to treatment, examination of patient, obtaining history from patient or surrogate, ordering and performing treatments and interventions, ordering and review of laboratory studies, ordering and review of radiographic studies, pulse oximetry  and re-evaluation of patient's condition.       Final Clinical Impression(s) / ED Diagnoses Final diagnoses:  None    Rx / DC Orders ED Discharge Orders     None         Lorre Nick, MD 02/01/23 1606    Lorre Nick, MD 02/01/23 1652

## 2023-02-02 DIAGNOSIS — E875 Hyperkalemia: Secondary | ICD-10-CM | POA: Diagnosis not present

## 2023-02-02 DIAGNOSIS — N179 Acute kidney failure, unspecified: Secondary | ICD-10-CM | POA: Diagnosis not present

## 2023-02-02 LAB — COMPREHENSIVE METABOLIC PANEL
ALT: 32 U/L (ref 0–44)
AST: 24 U/L (ref 15–41)
Albumin: 3.5 g/dL (ref 3.5–5.0)
Alkaline Phosphatase: 67 U/L (ref 38–126)
Anion gap: 8 (ref 5–15)
BUN: 29 mg/dL — ABNORMAL HIGH (ref 6–20)
CO2: 22 mmol/L (ref 22–32)
Calcium: 8.4 mg/dL — ABNORMAL LOW (ref 8.9–10.3)
Chloride: 107 mmol/L (ref 98–111)
Creatinine, Ser: 1.52 mg/dL — ABNORMAL HIGH (ref 0.61–1.24)
GFR, Estimated: 55 mL/min — ABNORMAL LOW (ref >60)
Glucose, Bld: 98 mg/dL (ref 70–99)
Potassium: 3.9 mmol/L (ref 3.5–5.1)
Sodium: 137 mmol/L (ref 135–145)
Total Bilirubin: 0.9 mg/dL (ref 0.3–1.2)
Total Protein: 7.2 g/dL (ref 6.5–8.1)

## 2023-02-02 LAB — CBC
HCT: 42 % (ref 39.0–52.0)
Hemoglobin: 13.5 g/dL (ref 13.0–17.0)
MCH: 27.5 pg (ref 26.0–34.0)
MCHC: 32.1 g/dL (ref 30.0–36.0)
MCV: 85.5 fL (ref 80.0–100.0)
Platelets: 287 10*3/uL (ref 150–400)
RBC: 4.91 MIL/uL (ref 4.22–5.81)
RDW: 13.5 % (ref 11.5–15.5)
WBC: 7.2 10*3/uL (ref 4.0–10.5)
nRBC: 0 % (ref 0.0–0.2)

## 2023-02-02 LAB — OSMOLALITY, URINE: Osmolality, Ur: 713 mOsm/kg (ref 300–900)

## 2023-02-02 LAB — HIV ANTIBODY (ROUTINE TESTING W REFLEX): HIV Screen 4th Generation wRfx: NONREACTIVE

## 2023-02-02 LAB — GLUCOSE, CAPILLARY
Glucose-Capillary: 93 mg/dL (ref 70–99)
Glucose-Capillary: 114 mg/dL — ABNORMAL HIGH (ref 70–99)

## 2023-02-02 LAB — TSH: TSH: 1.614 u[IU]/mL (ref 0.350–4.500)

## 2023-02-02 MED ORDER — ONDANSETRON HCL 4 MG PO TABS: 4.0000 mg | ORAL_TABLET | Freq: Four times a day (QID) | ORAL | 0 refills | Status: DC | PRN

## 2023-02-02 MED ORDER — DOCUSATE SODIUM 100 MG PO CAPS: 100.0000 mg | ORAL_CAPSULE | Freq: Two times a day (BID) | ORAL | 0 refills | Status: DC

## 2023-02-02 MED ORDER — HYDRALAZINE HCL 25 MG PO TABS: 25.0000 mg | ORAL_TABLET | Freq: Two times a day (BID) | ORAL | 0 refills | Status: DC

## 2023-02-02 MED ORDER — HYDROCODONE-ACETAMINOPHEN 5-325 MG PO TABS: 1.0000 | ORAL_TABLET | Freq: Four times a day (QID) | ORAL | 0 refills | Status: DC | PRN

## 2023-02-02 MED ORDER — ACETAMINOPHEN 325 MG PO TABS: 650.0000 mg | ORAL_TABLET | Freq: Four times a day (QID) | ORAL | 0 refills | Status: AC | PRN

## 2023-02-02 NOTE — Discharge Summary (Signed)
Physician Discharge Summary   Patient: [name]. MRN: [id number] DOB: 10/22/1970  Admit date:     02/01/2023  Discharge date: 02/02/2023  Discharge Physician: Marguerita Merles, DO   PCP: Evlyn Kanner, MD   Recommendations at discharge:  {Tip this will not be part of the note when signed- Example include specific recommendations for outpatient follow-up, pending tests to follow-up on. (Optional):26781}  ***F/u PCP F/u Neph  Discharge Diagnoses: Principal Problem:   AKI (acute kidney injury)  Resolved Problems:   * No resolved hospital problems. [name] Course: No notes on file  Assessment and Plan: No notes have been filed under this hospital service. Service: Hospitalist     {Tip this will not be part of the note when signed Body mass index is 44.05 kg/m. , ,  (Optional):26781}  {(NOTE) Pain control PDMP Statment (Optional):26782} Consultants: *** Procedures performed: ***  Disposition: {Plan; Disposition:26390} Diet recommendation:  Discharge Diet Orders (From admission, onward)     Start     Ordered   02/02/23 0000  Diet - low sodium heart healthy        02/02/23 1447   02/02/23 0000  Diet Carb Modified        02/02/23 1447           {Diet_Plan:26776} DISCHARGE MEDICATION: Allergies as of 02/02/2023       Reactions   Shrimp [shellfish Allergy] Anaphylaxis   Banana Other (See Comments)   Pt gags   Watermelon Flavor Nausea And Vomiting   Almond Oil Itching   Roof of mouth itches   Other Itching   Grapes cause itching   Peanut-containing Drug Products Itching, Cough   Sulfa Antibiotics Itching        Medication List     STOP taking these medications    diclofenac Sodium 1 % Gel Commonly known as: Voltaren   olmesartan 20 MG tablet Commonly known as: BENICAR       TAKE these medications    Accu-Chek Guide test strip Generic drug: glucose blood Please use strips to test morning fasting sugars and one hour after  each meal, total 4x daily   Accu-Chek Softclix Lancets lancets TEST UP TO 4 TIMES A DAY   acetaminophen 325 MG tablet Commonly known as: TYLENOL Take 2 tablets (650 mg total) by mouth every 6 (six) hours as needed for mild pain (or Fever >/= 101).   amLODipine 10 MG tablet Commonly known as: NORVASC TAKE 1 TABLET BY MOUTH EVERY DAY   amphetamine-dextroamphetamine 30 MG 24 hr capsule Commonly known as: ADDERALL XR Take 1 capsule (30 mg total) by mouth daily as needed (focus). 1 daily as needed What changed: additional instructions   apixaban 5 MG Tabs tablet Commonly known as: Eliquis Take 1 tablet (5 mg total) by mouth 2 (two) times daily.   aspirin EC 81 MG tablet Take 1 tablet (81 mg total) by mouth daily. Swallow whole.   atorvastatin 80 MG tablet Commonly known as: LIPITOR TAKE 1 TABLET BY MOUTH EVERY DAY   cetirizine 10 MG tablet Commonly known as: ZyrTEC Allergy Take 1 tablet (10 mg total) by mouth daily.   docusate sodium 100 MG capsule Commonly known as: COLACE Take 1 capsule (100 mg total) by mouth 2 (two) times daily.   flecainide 100 MG tablet Commonly known as: TAMBOCOR Take 1 tablet (100 mg total) by mouth 2 (two) times daily.   hydrALAZINE 25 MG tablet Commonly known as: APRESOLINE Take 1 tablet (  25 mg total) by mouth in the morning and at bedtime.   HYDROcodone-acetaminophen 5-325 MG tablet Commonly known as: NORCO/VICODIN Take 1 tablet by mouth every 6 (six) hours as needed for moderate pain.   Jardiance 25 MG Tabs tablet Generic drug: empagliflozin TAKE 1 TABLET (25 MG TOTAL) BY MOUTH DAILY.   lidocaine 5 % Commonly known as: Lidoderm Place 1 patch onto the skin daily. Remove & Discard patch within 12 hours or as directed by MD   metFORMIN 1000 MG tablet Commonly known as: GLUCOPHAGE TAKE 1 TABLET (1,000 MG TOTAL) BY MOUTH TWICE A DAY WITH FOOD What changed: See the new instructions.   metoprolol succinate 100 MG 24 hr tablet Commonly  known as: TOPROL-XL TAKE 1 TABLET BY MOUTH DAILY. TAKE WITH OR IMMEDIATELY FOLLOWING A MEAL. What changed: additional instructions   nicotine 21 mg/24hr patch Commonly known as: NICODERM CQ - dosed in mg/24 hours PLACE 1 PATCH (21 MG TOTAL) ONTO THE SKIN DAILY.   ondansetron 4 MG tablet Commonly known as: ZOFRAN Take 1 tablet (4 mg total) by mouth every 6 (six) hours as needed for nausea.   pantoprazole 40 MG tablet Commonly known as: PROTONIX TAKE 1 TABLET BY MOUTH EVERY DAY   polyethylene glycol 17 g packet Commonly known as: MiraLax Take 17 g by mouth daily.   tirzepatide 5 MG/0.5ML Pen Commonly known as: MOUNJARO Inject 5 mg into the skin once a week.        Discharge Exam: Ceasar Mons Weights   02/01/23 1857 02/02/23 0914  Weight: 132.9 kg 135.3 kg   ***  Condition at discharge: {DC Condition:26389}  The results of significant diagnostics from this hospitalization (including imaging, microbiology, ancillary and laboratory) are listed below for reference.   Imaging Studies: DG Thoracic Spine 2 View  Result Date: 02/01/2023 CLINICAL DATA:  No known injury. Low back pain radiating up the back. EXAM: THORACIC SPINE 2 VIEWS; LUMBAR SPINE - 2-3 VIEW COMPARISON:  CT abdomen and pelvis 02/01/2023 FINDINGS: No evidence of acute fracture or traumatic malalignment. Mild multilevel spondylosis. Disc space height is maintained. Mild lower lumbar facet arthropathy. IMPRESSION: 1. No acute findings in the thoracic or lumbar spine. 2. Mild multilevel spondylosis. Electronically Signed   By: Minerva Fester [name]   On: 02/01/2023 22:40   DG Lumbar Spine 2-3 Views  Result Date: 02/01/2023 CLINICAL DATA:  No known injury. Low back pain radiating up the back. EXAM: THORACIC SPINE 2 VIEWS; LUMBAR SPINE - 2-3 VIEW COMPARISON:  CT abdomen and pelvis 02/01/2023 FINDINGS: No evidence of acute fracture or traumatic malalignment. Mild multilevel spondylosis. Disc space height is maintained. Mild  lower lumbar facet arthropathy. IMPRESSION: 1. No acute findings in the thoracic or lumbar spine. 2. Mild multilevel spondylosis. Electronically Signed   By: Minerva Fester [name]   On: 02/01/2023 22:40   US RENAL  Result Date: 02/01/2023 CLINICAL DATA:  Acute kidney injury. EXAM: RENAL / URINARY TRACT ULTRASOUND COMPLETE COMPARISON:  Abdominopelvic CT earlier today FINDINGS: Right Kidney: Renal measurements: 11.7 x 6.6 x 6.7 cm = volume: 269 mL. Minimally increased parenchymal echogenicity. No hydronephrosis. No visualized stone or focal lesion. Left Kidney: Renal measurements: 11.8 x 5.9 x 6 cm = volume: 217 mL. Normal parenchymal echogenicity. No hydronephrosis. No visualized stone or focal lesion. Bladder: Appears normal for degree of bladder distention. Other: None. IMPRESSION: 1. No obstructive uropathy. 2. Minimally increased right renal parenchymal echogenicity suggesting underlying chronic medical renal disease. Electronically Signed   By: Shawna Orleans  Sanford [name]   On: 02/01/2023 21:21   CT ABDOMEN PELVIS WO CONTRAST  Result Date: 02/01/2023 CLINICAL DATA:  Low back pain for 1 week. EXAM: CT ABDOMEN AND PELVIS WITHOUT CONTRAST TECHNIQUE: Multidetector CT imaging of the abdomen and pelvis was performed following the standard protocol without IV contrast. RADIATION DOSE REDUCTION: This exam was performed according to the departmental dose-optimization program which includes automated exposure control, adjustment of the mA and/or kV according to patient size and/or use of iterative reconstruction technique. COMPARISON:  CT abdomen/pelvis 01/30/2019 FINDINGS: Lower chest: The lung bases are clear. The heart is mildly enlarged. Hepatobiliary: The liver and gallbladder are unremarkable. There is no biliary ductal dilatation. Pancreas: Unremarkable. Spleen: Unremarkable. Adrenals/Urinary Tract: The adrenals are unremarkable. There are no focal renal lesions, within the confines of noncontrast technique. There  are no stones in either kidney or along the course of either ureter. There is no hydronephrosis or hydroureter. There is bilateral perinephric stranding, nonspecific but can be seen in the setting of chronic renal disease. The bladder is decompressed but grossly unremarkable. Stomach/Bowel: The stomach is unremarkable. There is no evidence of bowel obstruction. There is no abnormal bowel wall thickening or inflammatory change. The appendix is surgically absent. Vascular/Lymphatic: There is mild calcified plaque in the nonaneurysmal abdominal aorta. There is no abdominal or pelvic lymphadenopathy. Reproductive: The prostate and seminal vesicles are unremarkable. Other: There is no ascites or free air. Musculoskeletal: There is no acute osseous abnormality or suspicious osseous lesion. IMPRESSION: No acute finding in the abdomen or pelvis. Electronically Signed   By: Lesia Hausen [name]   On: 02/01/2023 12:26    Microbiology: Results for orders placed or performed in visit on 03/09/22  Microscopic Examination     Status: Abnormal   Collection Time: 03/09/22 10:03 AM   Urine  Result Value Ref Range Status   WBC, UA 0-5 0 - 5 /hpf Final   RBC, Urine 0-2 0 - 2 /hpf Final   Epithelial Cells (non renal) 0-10 0 - 10 /hpf Final   Casts None seen None seen /lpf Final   Bacteria, UA Few (A) None seen/Few Final    Labs: CBC: Recent Labs  Lab 01/30/23 1017 01/31/23 1409 02/01/23 1043 02/02/23 0508  WBC 7.9 6.9 10.3 7.2  NEUTROABS  --   --  7.9*  --   HGB 14.8 14.4 15.5 13.5  HCT 43.8 44.5 47.8 42.0  MCV 83 85.4 85.7 85.5  PLT 311 309 PLATELET CLUMPS NOTED ON SMEAR, UNABLE TO ESTIMATE 287   Basic Metabolic Panel: Recent Labs  Lab 01/30/23 1015 01/31/23 1409 02/01/23 1200 02/01/23 1534 02/02/23 0508  NA 140 136 137  --  137  K 5.8* 5.0 6.1* 5.4* 3.9  CL 105 105 107  --  107  CO2 21 23 23   --  22  GLUCOSE 131* 157* 186*  --  98  BUN 24 27* 37*  --  29*  CREATININE 1.53* 1.58* 1.85*  --   1.52*  CALCIUM 9.0 9.2 8.4*  --  8.4*   Liver Function Tests: Recent Labs  Lab 02/01/23 1200 02/02/23 0508  AST 39 24  ALT 41 32  ALKPHOS 81 67  BILITOT 1.1 0.9  PROT 8.4* 7.2  ALBUMIN 3.9 3.5   CBG: Recent Labs  Lab 02/01/23 2221 02/02/23 0744 02/02/23 1121  GLUCAP 94 93 114*    Discharge time spent: {LESS THAN/GREATER THAN:26388} 30 minutes.  Signed: Merlene Laughter, DO [facility] 02/02/2023

## 2023-02-02 NOTE — Progress Notes (Signed)
  Transition of Care [facility]) Screening Note   Patient Details  Name: [name]. Date of Birth: [birth year]   Transition of Care [facility]) CM/SW Contact:    Otelia Santee, LCSW Phone Number: 02/02/2023, 1:53 PM    Transition of Care Department [facility]) has reviewed patient and no TOC needs have been identified at this time. We will continue to monitor patient advancement through interdisciplinary progression rounds. If new patient transition needs arise, please place a TOC consult.

## 2023-02-02 NOTE — Consult Note (Signed)
Renal Service Consult Note [facility]  [address] [name]. 02/02/2023 Maree Krabbe, MD Requesting Physician: Dr. Marland Mcalpine  Reason for Consult: Hyperkalemia, AKI HPI: The patient is a 52 y.o. year-old w/ PMH as below who was seen in ED on 4/17 for back pain. He rec'd a shot of toradol and had a high K+ so was given lokelma. He was dc'd home. Then returned the next day 4/18, yesterday and had N/V "all night" w/ some abd discomfort per ED notes. No CP, SOB. Back pain is better, abd discomfort in ED yest was not severe. Labs showed AKI and hyperkalemia. EDP called renal MD and they recommmended IVF"s, lokelma and hold any ACEi/ ARB/ nsaids. Pt was given IVF"s and today creat is 1.5 (down from 1.8), and K+ is down to 3.9 from 6.1 yesterday.   Pmh of etoh/ cocaine abuse, hx pancreatitis, HTN, HL, MO, atrial fib sp ablation, OSA, SVT, DM2, GERD.   Pt seen in room, denies taking any nsaid creams for pain. No aleve or motrin either. No BC's. Feeling better. He will get f/u in 2 wks w/ his PCP.   ROS - denies CP, no joint pain, no HA, no blurry vision, no rash, no diarrhea, no nausea/ vomiting, no dysuria, no difficulty voiding   Past Medical History  Past Medical History:  Diagnosis Date   Diabetes mellitus    GERD (gastroesophageal reflux disease)    History of alcohol abuse    History of cocaine use    History of echocardiogram    a. Echo 4/14: Moderate LVH, vigorous LVEF, EF 65-70%, normal wall motion, grade 2 diastolic dysfunction, mildly dilated aortic root and ascending aorta, ascending aorta 40 mm, aortic root 38 mm, mild LAE   History of pancreatitis 01/22/2019   Hx of cardiovascular stress test    a. GXT 5/14: No ischemic changes  //  b. ETT-Myoview 3/16:  Low risk, no ischemia, EF 58%   Hyperlipidemia    Hypertension    Morbid obesity    Paroxysmal atrial fibrillation    Occurring in 2008, with several recurrence since then (including in the setting of +  cocaine on UDS).   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 SVT 09/2019   Past Surgical History  Past Surgical History:  Procedure Laterality Date   APPENDECTOMY     ATRIAL FIBRILLATION ABLATION N/A 11/27/2019   Procedure: ATRIAL FIBRILLATION ABLATION;  Surgeon: [name], MD;  Location: [facility] INVASIVE CV LAB;  Service: Cardiovascular;  Laterality: N/A;   BUBBLE STUDY  05/20/2021   Procedure: BUBBLE STUDY;  Surgeon: Little Ishikawa, MD;  Location: [facility] ENDOSCOPY;  Service: Cardiovascular;;   ROTATOR CUFF REPAIR     SVT ABLATION N/A 11/27/2019   Procedure: SVT ABLATION;  Surgeon: [name], MD;  Location: [facility] INVASIVE CV LAB;  Service: Cardiovascular;  Laterality: N/A;   TEE WITHOUT CARDIOVERSION N/A 05/20/2021   Procedure: TRANSESOPHAGEAL ECHOCARDIOGRAM (TEE);  Surgeon: Little Ishikawa, MD;  Location: [facility]COPY;  Service: Cardiovascular;  Laterality: N/A;   TONSILLECTOMY     Family History  Family History  Problem Relation Age of Onset   Diabetes Mother    Heart attack Father 15   Stroke Maternal Grandmother    Stroke Paternal Grandmother    Diabetes Paternal Grandfather    Social History  reports that he quit smoking about 3 months  ago. His smoking use included cigarettes. He has a 14.00 pack-year smoking history. He has quit using smokeless tobacco.  His smokeless tobacco use included snuff. He reports current alcohol use of about 6.0 standard drinks of alcohol per week. He reports that he does not currently use drugs. Allergies  Allergies  Allergen Reactions   Shrimp [Shellfish Allergy] Anaphylaxis   Banana Other (See Comments)    Pt gags   Watermelon Flavor Nausea And Vomiting   Almond Oil Itching    Roof of mouth itches   Other Itching    Grapes cause itching   Peanut-Containing Drug Products Itching and Cough   Sulfa Antibiotics Itching   Home medications Prior to Admission medications    Medication Sig Start Date End Date Taking? Authorizing Provider  amLODipine (NORVASC) 10 MG tablet TAKE 1 TABLET BY MOUTH EVERY DAY 07/05/22  Yes Rocky Morel, DO  amphetamine-dextroamphetamine (ADDERALL XR) 30 MG 24 hr capsule Take 1 capsule (30 mg total) by mouth daily as needed (focus). 1 daily as needed Patient taking differently: Take 30 mg by mouth daily as needed (focus). 01/16/23  Yes Young, Joni Fears D, MD  apixaban (ELIQUIS) 5 MG TABS tablet Take 1 tablet (5 mg total) by mouth 2 (two) times daily. 09/06/22  Yes Rocky Morel, DO  atorvastatin (LIPITOR) 80 MG tablet TAKE 1 TABLET BY MOUTH EVERY DAY 01/04/23  Yes Evlyn Kanner, MD  cetirizine (ZYRTEC ALLERGY) 10 MG tablet Take 1 tablet (10 mg total) by mouth daily. 12/11/22 09/07/23 Yes Evlyn Kanner, MD  empagliflozin (JARDIANCE) 25 MG TABS tablet TAKE 1 TABLET (25 MG TOTAL) BY MOUTH DAILY. 12/01/22  Yes Evlyn Kanner, MD  flecainide (TAMBOCOR) 100 MG tablet Take 1 tablet (100 mg total) by mouth 2 (two) times daily. 01/04/23  Yes Graciella Freer, PA-C  metFORMIN (GLUCOPHAGE) 1000 MG tablet TAKE 1 TABLET (1,000 MG TOTAL) BY MOUTH TWICE A DAY WITH FOOD Patient taking differently: Take 500 mg by mouth in the morning and at bedtime. 06/05/22  Yes Evlyn Kanner, MD  metoprolol succinate (TOPROL-XL) 100 MG 24 hr tablet TAKE 1 TABLET BY MOUTH DAILY. TAKE WITH OR IMMEDIATELY FOLLOWING A MEAL. Patient taking differently: Take 100 mg by mouth daily. 03/02/22  Yes Evlyn Kanner, MD  olmesartan (BENICAR) 20 MG tablet TAKE 1 TABLET BY MOUTH EVERY DAY 11/09/22  Yes Evlyn Kanner, MD  pantoprazole (PROTONIX) 40 MG tablet TAKE 1 TABLET BY MOUTH EVERY DAY 09/04/22  Yes Nooruddin, Jason Fila, MD  tirzepatide [facility]) 5 MG/0.5ML Pen Inject 5 mg into the skin once a week. 09/06/22  Yes Rocky Morel, DO  Accu-Chek Softclix Lancets lancets TEST UP TO 4 TIMES A DAY 03/09/22   Ellison Carwin, MD  aspirin 81 MG EC tablet Take 1 tablet (81 mg  total) by mouth daily. Swallow whole. Patient not taking: Reported on 02/02/2023 08/04/21   Steffanie Rainwater, MD  diclofenac Sodium (VOLTAREN) 1 % GEL Apply 4 g topically 4 (four) times daily. Patient not taking: Reported on 02/02/2023 01/31/23   Kommor, Wyn Forster, MD  glucose blood (ACCU-CHEK GUIDE) test strip Please use strips to test morning fasting sugars and one hour after each meal, total 4x daily 03/10/22   Ellison Carwin, MD  lidocaine (LIDODERM) 5 % Place 1 patch onto the skin daily. Remove & Discard patch within 12 hours or as directed by MD Patient not taking: Reported on 02/02/2023 01/31/23   Kommor, Wyn Forster, MD  nicotine (NICODERM CQ - DOSED IN MG/24 HOURS) 21 mg/24hr patch PLACE  1 PATCH (21 MG TOTAL) ONTO THE SKIN DAILY. Patient not taking: Reported on 02/02/2023 12/20/20   Doran Stabler, DO  polyethylene glycol (MIRALAX) 17 g packet Take 17 g by mouth daily. Patient not taking: Reported on 02/02/2023 08/17/21   Evlyn Kanner, MD     Vitals:   02/02/23 0300 02/02/23 0750 02/02/23 0914 02/02/23 1235  BP: 137/88 (!) 137/98  (!) 145/97  Pulse: 88 82  73  Resp: 19 20  19   Temp: 98.3 F (36.8 C) 98.2 F (36.8 C)  97.7 F (36.5 C)  TempSrc:  Oral  Oral  SpO2: 94% 96%  97%  Weight:   135.3 kg   Height:       Exam Gen alert, no distress, obese, no distress, calm and pleasant No rash, cyanosis or gangrene Sclera anicteric, throat clear  No jvd or bruits Chest clear bilat to bases, no rales/ wheezing RRR no MRG Abd soft ntnd no mass or ascites +bs GU normal male MS no joint effusions or deformity Ext no LE or UE edema, no wounds or ulcers Neuro is alert, Ox 3 , nf      Home meds include - norvasc 10, jardiance, metformin, olmesartan 20 qd, mounjaro, adderall, eliquis, aspirin, toprol xl 100 qd, tambocor, voltaren gel, nicotine, protonix, prns     Date   Creat  eGFR   2020   1.14- 1.63   2021   1.10- 1.83   Jan- jun 2022 1.26- 1.51   Jul -dec 2022 1.24- 2.48   March-jun  2023 1.39- 1.95   Jul- nov 2023 1.17- 1.73    12/11/22   1.36  63 ml/min   4/16   1.53  54 ml/min    4/17   1.58   4/18   1.85  43 ml/min             02/02/23  1.52  55 ml/min     UA prot 100, 0-5 rbc/ wbc   Renal US = 10-11 cm kidneys w/o obstruction   CT noncon showed normal appearing kidneys w/o hydro  Assessment/ Plan: AKI on CKD 3a - b/l creat 1.3- 1.5 from feb- April 2024, eGFR 55- > 60 ml/min. Creat here was 1.85 in setting of recent back pain rx'd w/ IV toradol. UA and renal US unremarkable. Creat today is back in his normal range. Would avoid all oral/ topical / IV nsaids (says he is not taking any of these). Would avoid all ACEi/ ARB's as well for now. No need for renal f/u. Should f/u w/ his PCP in 2-3 wks probably for labs. Will sign off.  Hyperkalemia - due to vol depletion+ IV nsaids+ ARB effects.  Resolved. Would stop his ARB and avoid ACEi/ ARB for now until K+ and renal failure are under control . Can use hydralazine 25 bid or clonidine instead 0.1 bid in it's place. Cont BB and norvasc.  DM2 - not on insulin  HL Atrial fib      Rob Arlean Hopping  MD [facility] 02/02/2023, 1:22 PM  Recent Labs  Lab 02/01/23 1043 02/01/23 1200 02/01/23 1534 02/02/23 0508  HGB 15.5  --   --  13.5  ALBUMIN  --  3.9  --  3.5  CALCIUM  --  8.4*  --  8.4*  CREATININE  --  1.85*  --  1.52*  K  --  6.1* 5.4* 3.9   Inpatient medications:  apixaban  5 mg Oral BID   aspirin EC  81  mg Oral Daily   atorvastatin  80 mg Oral Daily   docusate sodium  100 mg Oral BID   flecainide  100 mg Oral BID   insulin aspart  0-15 Units Subcutaneous TID WC   insulin aspart  0-5 Units Subcutaneous QHS   lidocaine  1 patch Transdermal QHS   loratadine  10 mg Oral Daily   metoprolol succinate  100 mg Oral Daily   nicotine  21 mg Transdermal Daily   pantoprazole  40 mg Oral Daily   polyethylene glycol  17 g Oral Daily    sodium chloride 75 mL/hr at 02/02/23 0614   methocarbamol (ROBAXIN) IV     acetaminophen  **OR** acetaminophen, albuterol, bisacodyl, hydrALAZINE, HYDROcodone-acetaminophen, HYDROmorphone (DILAUDID) injection, methocarbamol (ROBAXIN) IV, ondansetron **OR** ondansetron (ZOFRAN) IV, polyethylene glycol

## 2023-02-04 ENCOUNTER — Other Ambulatory Visit: Payer: Self-pay | Admitting: Student

## 2023-02-04 DIAGNOSIS — E118 Type 2 diabetes mellitus with unspecified complications: Secondary | ICD-10-CM

## 2023-02-05 ENCOUNTER — Other Ambulatory Visit: Payer: Self-pay | Admitting: Student

## 2023-02-05 ENCOUNTER — Telehealth: Payer: Self-pay

## 2023-02-05 DIAGNOSIS — I480 Paroxysmal atrial fibrillation: Secondary | ICD-10-CM

## 2023-02-05 NOTE — H&P (Signed)
History and Physical     NGE:[id number] DOB: 11-17-1970 DOA: 02/01/2023  PCP: Evlyn Kanner, MD  Patient coming from: Home  Chief Complaint: Abdominal Pain, Back Pain, and Vomiting   HPI: [name]. is a 52 y.o. male with medical history significant for but not limited to diabetes mellitus type 2 with a recent hemoglobin A1c of 7.6, GERD, history of alcohol abuse and cocaine use, history of pancreatitis, hypertension, hyperlipidemia, morbid obesity, history of proximal atrial fibrillation on anticoagulation with apixaban, obstructive sleep apnea uses a CPAP as well as history of SVT and other comorbidities who presents to the hospital with abdominal discomfort and pain.  Yesterday the patient presented with back pain he was given a shot of Toradol and was found to have an elevated potassium and so was given Lokelma.  He was discharged home and then again returns today with abdominal discomfort and pain as well as back pain.  Patient states that after he was discharged home he started vomiting all night was vomiting this morning.  He describes no blood in his vomit.  Denies chest pain or shortness of breath.  States his abdominal discomfort is better after he had passed some gas.  Continues to have some back pain and states that it is getting better but still persists and he states is worse on the left side.  No nausea or vomiting.  Upon arrival to the ED he is found to have an AKI worsening creatinine as well as hyperkalemia.  The EDP called his nephrologist who recommended observation for the patient overnight with IV fluid hydration and Lokelma and patient will be seen by the nephrology team in the morning.  [facility] was asked to admit this patient for AKI and hyperkalemia as well as back pain.  ED Course: In the ED he had basic blood work done and was given [employer] as well as fluid hydration.  Given 1 L lactated ringer bolus, morphine, Zofran, Lokelma.  Patient is abdominal discomfort  improved and nausea vomiting resolved however he continues some back pain and discomfort.  Review of Systems: As per HPI otherwise all other systems reviewed and negative.   Past Medical History:  Diagnosis Date   Diabetes mellitus    GERD (gastroesophageal reflux disease)    History of alcohol abuse    History of cocaine use    History of echocardiogram    a. Echo 4/14: Moderate LVH, vigorous LVEF, EF 65-70%, normal wall motion, grade 2 diastolic dysfunction, mildly dilated aortic root and ascending aorta, ascending aorta 40 mm, aortic root 38 mm, mild LAE   History of pancreatitis 01/22/2019   Hx of cardiovascular stress test    a. GXT 5/14: No ischemic changes  //  b. ETT-Myoview 3/16:  Low risk, no ischemia, EF 58%   Hyperlipidemia    Hypertension    Morbid obesity    Paroxysmal atrial fibrillation    Occurring in 2008, with several recurrence since then (including in the setting of + cocaine on UDS).   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 SVT 09/2019   Past Surgical History:  Procedure Laterality Date   APPENDECTOMY     ATRIAL FIBRILLATION ABLATION N/A 11/27/2019   Procedure: ATRIAL FIBRILLATION ABLATION;  Surgeon: [name], MD;  Location: [facility] INVASIVE CV LAB;  Service: Cardiovascular;  Laterality: N/A;   BUBBLE STUDY  05/20/2021   Procedure:  BUBBLE STUDY;  Surgeon: Little Ishikawa, MD;  Location: [facility] ENDOSCOPY;  Service: Cardiovascular;;   ROTATOR CUFF REPAIR     SVT ABLATION N/A 11/27/2019   Procedure: SVT ABLATION;  Surgeon: [name], MD;  Location: [facility] INVASIVE CV LAB;  Service: Cardiovascular;  Laterality: N/A;   TEE WITHOUT CARDIOVERSION N/A 05/20/2021   Procedure: TRANSESOPHAGEAL ECHOCARDIOGRAM (TEE);  Surgeon: Little Ishikawa, MD;  Location: [facility] ENDOSCOPY;  Service: Cardiovascular;  Laterality: N/A;   TONSILLECTOMY     SOCIAL HISTORY  reports that he quit smoking about 3 months ago. His  smoking use included cigarettes. He has a 14.00 pack-year smoking history. He has quit using smokeless tobacco.  His smokeless tobacco use included snuff. He reports current alcohol use of about 6.0 standard drinks of alcohol per week. He reports that he does not currently use drugs.  Allergies  Allergen Reactions   Shrimp [Shellfish Allergy] Anaphylaxis   Banana Other (See Comments)    Pt gags   Watermelon Flavor Nausea And Vomiting   Almond Oil Itching    Roof of mouth itches   Other Itching    Grapes cause itching   Peanut-Containing Drug Products Itching and Cough   Sulfa Antibiotics Itching    Family History  Problem Relation Age of Onset   Diabetes Mother    Heart attack Father 29   Stroke Maternal Grandmother    Stroke Paternal Grandmother    Diabetes Paternal Grandfather     Prior to Admission medications   Medication Sig Start Date End Date Taking? Authorizing Provider  Accu-Chek Softclix Lancets lancets TEST UP TO 4 TIMES A DAY 03/09/22   Ellison Carwin, MD  amLODipine (NORVASC) 10 MG tablet TAKE 1 TABLET BY MOUTH EVERY DAY 07/05/22   Rocky Morel, DO  amphetamine-dextroamphetamine (ADDERALL XR) 30 MG 24 hr capsule Take 1 capsule (30 mg total) by mouth daily as needed (focus). 1 daily as needed 01/16/23   Jetty Duhamel D, MD  apixaban (ELIQUIS) 5 MG TABS tablet Take 1 tablet (5 mg total) by mouth 2 (two) times daily. 09/06/22   Rocky Morel, DO  aspirin 81 MG EC tablet Take 1 tablet (81 mg total) by mouth daily. Swallow whole. 08/04/21   Steffanie Rainwater, MD  atorvastatin (LIPITOR) 80 MG tablet TAKE 1 TABLET BY MOUTH EVERY DAY 01/04/23   Evlyn Kanner, MD  blood glucose meter kit and supplies KIT Dispense based on patient and insurance preference. Use up to four times daily as directed. 12/29/20   Evlyn Kanner, MD  Blood Pressure Monitoring (BLOOD PRESSURE MONITOR/L CUFF) MISC 1 Units by Does not apply route daily. 03/19/19   Jeannie Fend, PA-C  cetirizine  (ZYRTEC ALLERGY) 10 MG tablet Take 1 tablet (10 mg total) by mouth daily. 12/11/22 09/07/23  Evlyn Kanner, MD  diclofenac Sodium (VOLTAREN) 1 % GEL Apply 4 g topically 4 (four) times daily. 01/31/23   Kommor, Madison, MD  empagliflozin (JARDIANCE) 25 MG TABS tablet TAKE 1 TABLET (25 MG TOTAL) BY MOUTH DAILY. 12/01/22   Evlyn Kanner, MD  flecainide (TAMBOCOR) 100 MG tablet Take 1 tablet (100 mg total) by mouth 2 (two) times daily. 01/04/23   Graciella Freer, PA-C  glucose blood (ACCU-CHEK GUIDE) test strip Please use strips to test morning fasting sugars and one hour after each meal, total 4x daily 03/10/22   Ellison Carwin, MD  lidocaine (LIDODERM) 5 % Place 1 patch onto the skin daily. Remove & Discard patch within 12 hours or  as directed by MD 01/31/23   Kommor, Wyn Forster, MD  metFORMIN (GLUCOPHAGE) 1000 MG tablet TAKE 1 TABLET (1,000 MG TOTAL) BY MOUTH TWICE A DAY WITH FOOD 06/05/22   Evlyn Kanner, MD  metoprolol succinate (TOPROL-XL) 100 MG 24 hr tablet TAKE 1 TABLET BY MOUTH DAILY. TAKE WITH OR IMMEDIATELY FOLLOWING A MEAL. 03/02/22   Evlyn Kanner, MD  nicotine (NICODERM CQ - DOSED IN MG/24 HOURS) 21 mg/24hr patch PLACE 1 PATCH (21 MG TOTAL) ONTO THE SKIN DAILY. 12/20/20   Doran Stabler, DO  olmesartan (BENICAR) 20 MG tablet TAKE 1 TABLET BY MOUTH EVERY DAY 11/09/22   Evlyn Kanner, MD  pantoprazole (PROTONIX) 40 MG tablet TAKE 1 TABLET BY MOUTH EVERY DAY 09/04/22   Nooruddin, Jason Fila, MD  polyethylene glycol (MIRALAX) 17 g packet Take 17 g by mouth daily. 08/17/21   Evlyn Kanner, MD  tirzepatide [facility]) 5 MG/0.5ML Pen Inject 5 mg into the skin once a week. 09/06/22   Rocky Morel, DO    Physical Exam: Vitals:   02/01/23 1400 02/01/23 1615 02/01/23 1700  BP: (!) 179/110 (!) 161/118   Pulse: 77 80   Resp: 16 16   Temp:   98 F (36.7 C)  SpO2: 96% 95%    Constitutional: WN/WD morbidly obese African-American male appears calm sitting in the chair Respiratory:  Diminished to auscultation bilaterally with coarse breath sounds, no wheezing, rales, rhonchi or crackles. Normal respiratory effort and patient is not tachypenic. No accessory muscle use.  Unlabored breathing Cardiovascular: RRR, no murmurs / rubs / gallops. S1 and S2 auscultated.  Trace extremity edema Abdomen: Soft, non-tender, distended secondary to body habitus. Bowel sounds positive.  GU: Deferred. Musculoskeletal: No clubbing / cyanosis of digits/nails. No joint deformity upper and lower extremities.  Skin: No rashes, lesions, ulcers limited skin evaluation. No induration; Warm and dry.  Neurologic: CN 2-12 grossly intact with no focal deficits. Romberg sign and cerebellar reflexes not assessed.  Psychiatric: Normal judgment and insight. Alert and oriented x 3. Normal mood and appropriate affect.   Labs on Admission: I have personally reviewed following labs and imaging studies  CBC: Recent Labs  Lab 01/30/23 1017 01/31/23 1409 02/01/23 1043  WBC 7.9 6.9 10.3  NEUTROABS  --   --  7.9*  HGB 14.8 14.4 15.5  HCT 43.8 44.5 47.8  MCV 83 85.4 85.7  PLT 311 309 PLATELET CLUMPS NOTED ON SMEAR, UNABLE TO ESTIMATE   Basic Metabolic Panel: Recent Labs  Lab 01/30/23 1015 01/31/23 1409 02/01/23 1200 02/01/23 1534  NA 140 136 137  --   K 5.8* 5.0 6.1* 5.4*  CL 105 105 107  --   CO2 21 23 23   --   GLUCOSE 131* 157* 186*  --   BUN 24 27* 37*  --   CREATININE 1.53* 1.58* 1.85*  --   CALCIUM 9.0 9.2 8.4*  --    GFR: Estimated Creatinine Clearance: 63.3 mL/min (A) (by C-G formula based on SCr of 1.85 mg/dL (H)). Liver Function Tests: Recent Labs  Lab 02/01/23 1200  AST 39  ALT 41  ALKPHOS 81  BILITOT 1.1  PROT 8.4*  ALBUMIN 3.9   Recent Labs  Lab 02/01/23 1200  LIPASE 41   No results for input(s): "AMMONIA" in the last 168 hours. Coagulation Profile: No results for input(s): "INR", "PROTIME" in the last 168 hours. Cardiac Enzymes: No results for input(s): "CKTOTAL",  "CKMB", "CKMBINDEX", "TROPONINI" in the last 168 hours. BNP (last 3 results) No results for  input(s): "PROBNP" in the last 8760 hours. HbA1C: No results for input(s): "HGBA1C" in the last 72 hours. CBG: No results for input(s): "GLUCAP" in the last 168 hours. Lipid Profile: Recent Labs    01/30/23 1017  CHOL 139  HDL 34*  LDLCALC 67  TRIG 782*  CHOLHDL 4.1   Thyroid Function Tests: No results for input(s): "TSH", "T4TOTAL", "FREET4", "T3FREE", "THYROIDAB" in the last 72 hours. Anemia Panel: No results for input(s): "VITAMINB12", "FOLATE", "FERRITIN", "TIBC", "IRON", "RETICCTPCT" in the last 72 hours. Urine analysis:    Component Value Date/Time   COLORURINE YELLOW 02/01/2023 1043   APPEARANCEUR CLEAR 02/01/2023 1043   APPEARANCEUR Clear 03/09/2022 1003   LABSPEC 1.025 02/01/2023 1043   PHURINE 5.0 02/01/2023 1043   GLUCOSEU >=500 (A) 02/01/2023 1043   HGBUR SMALL (A) 02/01/2023 1043   BILIRUBINUR NEGATIVE 02/01/2023 1043   BILIRUBINUR Negative 03/09/2022 1003   KETONESUR NEGATIVE 02/01/2023 1043   PROTEINUR 100 (A) 02/01/2023 1043   UROBILINOGEN 1.0 11/19/2013 1855   NITRITE NEGATIVE 02/01/2023 1043   LEUKOCYTESUR NEGATIVE 02/01/2023 1043   Sepsis Labs: !!!!!!!!!!!!!!!!!!!!!!!!!!!!!!!!!!!!!!!!!!!! @LABRCNTIP (procalcitonin:4,lacticidven:4) )No results found for this or any previous visit (from the past 240 hour(s)).   Radiological Exams on Admission: CT ABDOMEN PELVIS WO CONTRAST  Result Date: 02/01/2023 CLINICAL DATA:  Low back pain for 1 week. EXAM: CT ABDOMEN AND PELVIS WITHOUT CONTRAST TECHNIQUE: Multidetector CT imaging of the abdomen and pelvis was performed following the standard protocol without IV contrast. RADIATION DOSE REDUCTION: This exam was performed according to the departmental dose-optimization program which includes automated exposure control, adjustment of the mA and/or kV according to patient size and/or use of iterative reconstruction technique.  COMPARISON:  CT abdomen/pelvis 01/30/2019 FINDINGS: Lower chest: The lung bases are clear. The heart is mildly enlarged. Hepatobiliary: The liver and gallbladder are unremarkable. There is no biliary ductal dilatation. Pancreas: Unremarkable. Spleen: Unremarkable. Adrenals/Urinary Tract: The adrenals are unremarkable. There are no focal renal lesions, within the confines of noncontrast technique. There are no stones in either kidney or along the course of either ureter. There is no hydronephrosis or hydroureter. There is bilateral perinephric stranding, nonspecific but can be seen in the setting of chronic renal disease. The bladder is decompressed but grossly unremarkable. Stomach/Bowel: The stomach is unremarkable. There is no evidence of bowel obstruction. There is no abnormal bowel wall thickening or inflammatory change. The appendix is surgically absent. Vascular/Lymphatic: There is mild calcified plaque in the nonaneurysmal abdominal aorta. There is no abdominal or pelvic lymphadenopathy. Reproductive: The prostate and seminal vesicles are unremarkable. Other: There is no ascites or free air. Musculoskeletal: There is no acute osseous abnormality or suspicious osseous lesion. IMPRESSION: No acute finding in the abdomen or pelvis. Electronically Signed   By: Lesia Hausen [name]   On: 02/01/2023 12:26    EKG: Independently reviewed.  Showed normal sinus rhythm at a rate of 75 with T wave inversions in lead III left anterior fascicular block.  Patient had a QTc of 433.  There is no real evidence of ST elevation on my interpretation.  Assessment/Plan Principal Problem:   AKI (acute kidney injury)  AKI on CKD Stage 3a -Placed in observation telemetry -BUN/Cr Trend: Recent Labs  Lab 01/30/23 1015 01/31/23 1409 02/01/23 1200  BUN 24 27* 37*  CREATININE 1.53* 1.58* 1.85*  -Given a 1 L bolus in the ED and then was placed on lactated Ringer's at 125 MLS per hour but per nephrology recommendations will  change to 75 MLS per  hour for normal saline -Pursue a urinalysis workup and obtain a UDS and renal ultrasound, urine osmolality, urine sodium and urine creatinine -Avoid Nephrotoxic Medications (holding home ARB), Contrast Dyes, Hypotension and Dehydration to Ensure Adequate Renal Perfusion and will need to Renally Adjust Meds -Continue to Monitor and Trend Renal Function carefully and repeat CMP in the AM   Hyperkalemia -K+ Trend: Recent Labs  Lab 01/30/23 1015 01/31/23 1409 02/01/23 1200 02/01/23 1534  K 5.8* 5.0 6.1* 5.4*  -Given Lokelma in the ED and per nephrology will continue Lokelma 3 times daily and initiate IV fluid hydration at 75 MLS per hour -Continue to monitor on telemetry -Further care per nephrology  Diabetes Mellitus Type 2 -Recent hemoglobin A1c was 7.6 -Hold oral medications and placed on moderate NovoLog sliding scale insulin before meals and at bedtime -Order CBGs per protocol  Hypertriglyceridemia  -Noted and will need dietary changes  Abdominal Pain -Unclear etiology -CT scan of the abdomen pelvis without contrast done and showed no acute findings in the abdomen and pelvis -Continue with analgesics and bowel regimen -Continue PPI -Patient thought it may have been secondary to gas pain and is resolved and if necessary will place on Gas-X  Back Pain -Continue lidocaine patch and unclear etiology -Will obtain a DG x-ray first of his lumbar thoracic spine and then consider further testing  Hypertension -Continue home meds but hold amlodipine and olmesartan -Continue monitor blood pressures per protocol -Added IV hydralazine for systolic blood pressure greater than 180 or diastolic blood pressure 100 -Continue monitor blood pressures per protocol  Paroxysmal atrial fibrillation -Continue with beta-blocker and anticoagulation and monitor on telemetry -Continue with home flecainide 100 mg p.o. twice daily and continue monitor electrolytes  History of  narcolepsy -Continue with Adderall XR 30 mg daily as needed  Morbid Obesity -Complicates overall prognosis and care -Estimated body mass index is 43.42 kg/m as calculated from the following:   Height as of 01/30/23:  (1.753 m).   Weight as of 01/30/23: 133.4 kg.  -Weight Loss and Dietary Counseling given  Tobacco abuse -Smoking cessation counseling given and will continue nicotine patch 21 mg transdermally  GERD/GI prophylaxis -Continue home PPI with pantoprazole fluids and p.o. daily and may need to increase to twice daily  History of substance abuse -Check UDS  DVT prophylaxis: Anticoagulated with apixaban Code Status: Full code Family Communication: No family at bedside Disposition Plan: Pending further clinical improvement and clearance by nephrology Consults called: Nephrology Admission status: Observation telemetry  [facility], D.O. [facility] PAGER is on AMION  If 7PM-7AM, please contact night-coverage [email or web address]  02/01/2023, 5:52 PM

## 2023-02-05 NOTE — Transitions of Care (Post Inpatient/ED Visit) (Signed)
   02/05/2023  Name: [name]. MRN: [id number] DOB: [birth year]  Today's TOC FU Call Status: Today's TOC FU Call Status:: Successful TOC FU Call Competed TOC FU Call Complete Date: 02/05/23  Transition Care Management Follow-up Telephone Call Date of Discharge: 02/02/23 Discharge Facility: [facility]) Type of Discharge: Inpatient Admission Primary Inpatient Discharge Diagnosis:: kidney disease How have you been since you were released from the hospital?: Better Any questions or concerns?: No  Items Reviewed: Did you receive and understand the discharge instructions provided?: Yes Medications obtained and verified?: Yes (Medications Reviewed) Any new allergies since your discharge?: No Dietary orders reviewed?: Yes Do you have support at home?: Yes People in Home: sibling(s)  Home Care and Equipment/Supplies: Were Home Health Services Ordered?: NA Any new equipment or medical supplies ordered?: NA  Functional Questionnaire: Do you need assistance with bathing/showering or dressing?: No Do you need assistance with meal preparation?: No Do you need assistance with eating?: No Do you have difficulty maintaining continence: No Do you need assistance with getting out of bed/getting out of a chair/moving?: No Do you have difficulty managing or taking your medications?: No  Follow up appointments reviewed: PCP Follow-up appointment confirmed?: No (no avail appt, sent message to staff to schedule) MD Provider Line [phone] Given: No Specialist Hospital Follow-up appointment confirmed?: NA Do you need transportation to your follow-up appointment?: No Do you understand care options if your condition(s) worsen?: Yes-patient verbalized understanding    SIGNATURE Karena Addison, LPN [facility] Nurse Health Advisor Direct Dial [phone]

## 2023-02-05 NOTE — Addendum Note (Signed)
Addended by: Louanne Belton, Analya Louissaint A on: 02/05/2023 10:40 AM   Modules accepted: Orders

## 2023-02-16 ENCOUNTER — Telehealth: Payer: Self-pay | Admitting: Internal Medicine

## 2023-02-16 DIAGNOSIS — E118 Type 2 diabetes mellitus with unspecified complications: Secondary | ICD-10-CM

## 2023-02-16 MED ORDER — AMPHETAMINE-DEXTROAMPHET ER 30 MG PO CP24: 30.0000 mg | ORAL_CAPSULE | Freq: Every day | ORAL | 0 refills | Status: DC | PRN

## 2023-02-16 NOTE — Telephone Encounter (Signed)
PT calling for Addreall refill. Please call in to [facility] [address].  PT also needs a referral to a diabetes Dr.  Martha Clan call @ [phone]

## 2023-02-16 NOTE — Telephone Encounter (Signed)
Dr. Maple Hudson, please advise if you are okay refilling pt's adderall.  Allergies  Allergen Reactions   Shrimp [Shellfish Allergy] Anaphylaxis   Banana Other (See Comments)    Pt gags   Watermelon Flavor Nausea And Vomiting   Almond Oil Itching    Roof of mouth itches   Other Itching    Grapes cause itching   Peanut-Containing Drug Products Itching and Cough   Sulfa Antibiotics Itching     Current Outpatient Medications:    Accu-Chek Softclix Lancets lancets, TEST UP TO 4 TIMES A DAY, Disp: 100 each, Rfl: 2   acetaminophen (TYLENOL) 325 MG tablet, Take 2 tablets (650 mg total) by mouth every 6 (six) hours as needed for mild pain (or Fever >/= 101)., Disp: 20 tablet, Rfl: 0   amLODipine (NORVASC) 10 MG tablet, TAKE 1 TABLET BY MOUTH EVERY DAY, Disp: 30 tablet, Rfl: 11   amphetamine-dextroamphetamine (ADDERALL XR) 30 MG 24 hr capsule, Take 1 capsule (30 mg total) by mouth daily as needed (focus). 1 daily as needed (Patient taking differently: Take 30 mg by mouth daily as needed (focus).), Disp: 30 capsule, Rfl: 0   apixaban (ELIQUIS) 5 MG TABS tablet, Take 1 tablet (5 mg total) by mouth 2 (two) times daily., Disp: 60 tablet, Rfl: 5   aspirin 81 MG EC tablet, Take 1 tablet (81 mg total) by mouth daily. Swallow whole., Disp: 90 tablet, Rfl: 1   atorvastatin (LIPITOR) 80 MG tablet, TAKE 1 TABLET BY MOUTH EVERY DAY, Disp: 90 tablet, Rfl: 3   cetirizine (ZYRTEC ALLERGY) 10 MG tablet, Take 1 tablet (10 mg total) by mouth daily., Disp: 90 tablet, Rfl: 2   docusate sodium (COLACE) 100 MG capsule, Take 1 capsule (100 mg total) by mouth 2 (two) times daily., Disp: 10 capsule, Rfl: 0   empagliflozin (JARDIANCE) 25 MG TABS tablet, TAKE 1 TABLET (25 MG TOTAL) BY MOUTH DAILY., Disp: 90 tablet, Rfl: 2   flecainide (TAMBOCOR) 100 MG tablet, TAKE 1 TABLET BY MOUTH TWICE A DAY, Disp: 180 tablet, Rfl: 3   glucose blood (ACCU-CHEK GUIDE) test strip, Please use strips to test morning fasting sugars and one hour  after each meal, total 4x daily, Disp: 200 strip, Rfl: 5   hydrALAZINE (APRESOLINE) 25 MG tablet, Take 1 tablet (25 mg total) by mouth in the morning and at bedtime., Disp: 60 tablet, Rfl: 0   HYDROcodone-acetaminophen (NORCO/VICODIN) 5-325 MG tablet, Take 1 tablet by mouth every 6 (six) hours as needed for moderate pain., Disp: 10 tablet, Rfl: 0   lidocaine (LIDODERM) 5 %, Place 1 patch onto the skin daily. Remove & Discard patch within 12 hours or as directed by MD (Patient not taking: Reported on 02/02/2023), Disp: 30 patch, Rfl: 0   metFORMIN (GLUCOPHAGE) 1000 MG tablet, TAKE 1 TABLET (1,000 MG TOTAL) BY MOUTH TWICE A DAY WITH FOOD (Patient taking differently: Take 500 mg by mouth in the morning and at bedtime.), Disp: 60 tablet, Rfl: 7   metoprolol succinate (TOPROL-XL) 100 MG 24 hr tablet, TAKE 1 TABLET BY MOUTH DAILY. TAKE WITH OR IMMEDIATELY FOLLOWING A MEAL. (Patient taking differently: Take 100 mg by mouth daily.), Disp: 90 tablet, Rfl: 3   nicotine (NICODERM CQ - DOSED IN MG/24 HOURS) 21 mg/24hr patch, PLACE 1 PATCH (21 MG TOTAL) ONTO THE SKIN DAILY. (Patient not taking: Reported on 02/02/2023), Disp: 28 patch, Rfl: 1   ondansetron (ZOFRAN) 4 MG tablet, Take 1 tablet (4 mg total) by mouth every 6 (six) hours  as needed for nausea., Disp: 20 tablet, Rfl: 0   pantoprazole (PROTONIX) 40 MG tablet, TAKE 1 TABLET BY MOUTH EVERY DAY, Disp: 30 tablet, Rfl: 11   polyethylene glycol (MIRALAX) 17 g packet, Take 17 g by mouth daily. (Patient not taking: Reported on 02/02/2023), Disp: 14 each, Rfl: 0   tirzepatide (MOUNJARO) 5 MG/0.5ML Pen, INJECT 5 MG SUBCUTANEOUSLY WEEKLY, Disp: 2 mL, Rfl: 2

## 2023-02-16 NOTE — Telephone Encounter (Signed)
[facility]  He asks referral to a diabetes doctor- suggest referral to Dr Talmage Nap

## 2023-02-16 NOTE — Telephone Encounter (Signed)
Attempted to call pt but unable to reach. Left detailed message letting him know that medication was sent to pharmacy and that we were placing referral. Nothing further needed.

## 2023-02-21 ENCOUNTER — Encounter: Payer: 59 | Admitting: Student

## 2023-03-02 ENCOUNTER — Telehealth: Payer: Self-pay | Admitting: Internal Medicine

## 2023-03-02 NOTE — Telephone Encounter (Signed)
PT states he got a voice mail from [facility] telling him that the Dr. He was referred to will not take him because he is on Medicare. That medicare is not in use. It's a "Family Planning" only insurance. He does have another insurance he uses.  Please call to advise. Was this [facility] or Dr. Willeen Cass office [facility]?    Dr Maple Hudson notes in last tel encounter. : He asks referral to a diabetes doctor- suggest referral to Dr Talmage Nap

## 2023-03-05 NOTE — Telephone Encounter (Signed)
Please advise 

## 2023-03-05 NOTE — Telephone Encounter (Signed)
This was me that called the patient I tried to tell the office Dr Talmage Nap that his plan was [employer] family planning and she still told me they do not take any Medicaid insurance so I sent his referral to [facility]

## 2023-03-08 ENCOUNTER — Ambulatory Visit (INDEPENDENT_AMBULATORY_CARE_PROVIDER_SITE_OTHER): Payer: 59 | Admitting: Student

## 2023-03-08 ENCOUNTER — Encounter: Payer: Self-pay | Admitting: Student

## 2023-03-08 ENCOUNTER — Other Ambulatory Visit: Payer: Self-pay

## 2023-03-08 VITALS — BP 134/87 | HR 88 | Temp 98.7°F | Ht 69.0 in | Wt 289.3 lb

## 2023-03-08 DIAGNOSIS — N1831 Chronic kidney disease, stage 3a: Secondary | ICD-10-CM | POA: Diagnosis not present

## 2023-03-08 DIAGNOSIS — Z7984 Long term (current) use of oral hypoglycemic drugs: Secondary | ICD-10-CM

## 2023-03-08 DIAGNOSIS — Z7985 Long-term (current) use of injectable non-insulin antidiabetic drugs: Secondary | ICD-10-CM

## 2023-03-08 DIAGNOSIS — E875 Hyperkalemia: Secondary | ICD-10-CM

## 2023-03-08 DIAGNOSIS — E118 Type 2 diabetes mellitus with unspecified complications: Secondary | ICD-10-CM

## 2023-03-08 DIAGNOSIS — I10 Essential (primary) hypertension: Secondary | ICD-10-CM

## 2023-03-08 DIAGNOSIS — I129 Hypertensive chronic kidney disease with stage 1 through stage 4 chronic kidney disease, or unspecified chronic kidney disease: Secondary | ICD-10-CM | POA: Diagnosis not present

## 2023-03-08 DIAGNOSIS — E1122 Type 2 diabetes mellitus with diabetic chronic kidney disease: Secondary | ICD-10-CM | POA: Diagnosis not present

## 2023-03-08 LAB — POCT GLYCOSYLATED HEMOGLOBIN (HGB A1C): Hemoglobin A1C: 8.1 % — AB (ref 4.0–5.6)

## 2023-03-08 LAB — GLUCOSE, CAPILLARY: Glucose-Capillary: 118 mg/dL — ABNORMAL HIGH (ref 70–99)

## 2023-03-08 MED ORDER — MOUNJARO 7.5 MG/0.5ML ~~LOC~~ SOAJ: 7.5000 mg | SUBCUTANEOUS | 2 refills | Status: AC

## 2023-03-08 NOTE — Assessment & Plan Note (Signed)
Patient was recently seen in the hospital for AKI on CKD.  Patient also had hyperkalemia.  Creatinine did come back to baseline.  Baseline is around 1.3-1.5.  Plan: -Refer to nephrology -BMP pending

## 2023-03-08 NOTE — Assessment & Plan Note (Signed)
Patient A1c up to 8.1 from 7.6 3 months ago.  He states he is compliant with his medication.  His current regimen includes metformin 1000 mg twice daily, empagliflozin 25 mg daily, Mounjaro 5 mg weekly.  Microalbumin collected today.  Patient has plans for ophthalmology in July.  Will increase Mounjaro as empagliflozin and metformin maxed out.  Plan: -Continue metformin 1000 mg twice daily -Continue empagliflozin 25 mg daily -Increase Mounjaro to 7.5 mg weekly -Continue atorvastatin 80 mg daily -Urine microalbumin/creatinine ratio at next visit -BMP today -Follow-up in 1 month for potentially titrating up mounjaro

## 2023-03-08 NOTE — Assessment & Plan Note (Addendum)
Patient presenting for hospital follow-up for hyperkalemia.  Patient seems to have recurrent hyperkalemia.  Will recheck BMP today.  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 Patient has been taken off of olmesartan.  Plan: -Plan to refer to nephrology -Check renin aldosterone ratio today -Recheck potassium today  Addendum:  -Potassium within normal limits, aldosterone levels within normal limits.

## 2023-03-08 NOTE — Assessment & Plan Note (Addendum)
Patient was recently discharged from the hospital about 1 month ago, and taken off of his olmesartan 20 mg daily.  Patient was to continue to take Norvasc 10 mg daily, metoprolol succinate 100 mg daily, and hydralazine 25 mg twice daily.  He states he stopped taking his amlodipine, and ran out of his metoprolol.  He continued on hydralazine 25 mg twice daily.  Blood pressure today is 134/87 and 134/92.  He did take his hydralazine this morning.  Given he is close to where I want him and only on hydralazine, I do not think he needs to be on metoprolol succinate 100 mg anymore.  I do recommend him continue on his hydralazine as well as his Norvasc.  Plan: -Continue amlodipine 10 mg daily -Continue hydralazine 25 mg twice daily -Discontinue metoprolol succinate 100 mg daily -BMP pending

## 2023-03-08 NOTE — Progress Notes (Addendum)
CC: Hospital follow up  HPI:  [name]. is a 52 y.o. male with a past medical history of hypertension, paroxysmal atrial fibrillation, type 2 diabetes, hyperlipidemia who presents for hospital follow-up.  Patient was recently admitted from 02/01/03/19.  He was admitted for AKI as well as hyperkalemia.  On admission, patient BNP was showing creatinine 1.58 as well as potassium of 6.1.  Patient was treated with fluids in the hospital.  And also given Lokelma.  Patient was discharged and discontinued on olmesartan.  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 Echo 4/14: Moderate LVH, vigorous LVEF, EF 65-70%, normal wall motion, grade 2 diastolic dysfunction, mildly dilated aortic root and ascending aorta, ascending aorta 40 mm, aortic root 38 mm, mild LAE   History of pancreatitis 01/22/2019   Hx of cardiovascular stress test    a. GXT 5/14: No ischemic changes  //  b. ETT-Myoview 3/16:  Low risk, no ischemia, EF 58%   Hyperlipidemia    Hypertension    Morbid obesity (HCC)    Paroxysmal atrial fibrillation (HCC)    Occurring in 2008, with several recurrence since then (including in the setting of + cocaine on UDS).   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     Current Outpatient Medications:    tirzepatide (MOUNJARO) 7.5 MG/0.5ML Pen, Inject 7.5 mg  into the skin once a week., Disp: 2 mL, Rfl: 2   Accu-Chek Softclix Lancets lancets, TEST UP TO 4 TIMES A DAY, Disp: 100 each, Rfl: 2   acetaminophen (TYLENOL) 325 MG tablet, Take 2 tablets (650 mg total) by mouth every 6 (six) hours as needed for mild pain (or Fever >/= 101)., Disp: 20 tablet, Rfl: 0   amLODipine (NORVASC) 10 MG tablet, TAKE 1 TABLET BY MOUTH EVERY DAY, Disp: 30 tablet, Rfl: 11   amphetamine-dextroamphetamine (ADDERALL XR) 30 MG 24 hr capsule, Take 1 capsule (30 mg total) by mouth daily as needed (focus). 1 daily as needed, Disp: 30 capsule, Rfl: 0   apixaban (ELIQUIS) 5 MG TABS tablet, Take 1 tablet (5 mg total) by mouth 2 (two) times daily., Disp: 60 tablet, Rfl: 5   aspirin 81 MG EC tablet, Take 1 tablet (81 mg total) by mouth daily. Swallow whole., Disp: 90 tablet, Rfl: 1   atorvastatin (LIPITOR) 80 MG tablet, TAKE 1 TABLET BY MOUTH EVERY DAY, Disp: 90 tablet, Rfl: 3   cetirizine (ZYRTEC ALLERGY) 10 MG tablet, Take 1 tablet (10 mg total) by mouth daily., Disp: 90 tablet, Rfl: 2   docusate sodium (COLACE) 100 MG capsule, Take 1 capsule (100 mg total) by mouth 2 (two) times daily., Disp: 10 capsule, Rfl: 0   empagliflozin (JARDIANCE) 25 MG TABS tablet, TAKE 1 TABLET (25 MG TOTAL) BY MOUTH DAILY., Disp: 90 tablet,  Rfl: 2   flecainide (TAMBOCOR) 100 MG tablet, TAKE 1 TABLET BY MOUTH TWICE A DAY, Disp: 180 tablet, Rfl: 3   glucose blood (ACCU-CHEK GUIDE) test strip, Please use strips to test morning fasting sugars and one hour after each meal, total 4x daily, Disp: 200 strip, Rfl: 5   hydrALAZINE (APRESOLINE) 25 MG tablet, Take 1 tablet (25 mg total) by mouth in the morning and at bedtime., Disp: 60 tablet, Rfl: 0   metFORMIN (GLUCOPHAGE) 1000 MG tablet, TAKE 1 TABLET (1,000 MG TOTAL) BY MOUTH TWICE A DAY WITH FOOD (Patient taking differently: Take 500 mg by mouth in the morning and at bedtime.), Disp: 60 tablet, Rfl: 7   nicotine (NICODERM CQ - DOSED IN MG/24 HOURS) 21 mg/24hr patch,  PLACE 1 PATCH (21 MG TOTAL) ONTO THE SKIN DAILY. (Patient not taking: Reported on 02/02/2023), Disp: 28 patch, Rfl: 1   pantoprazole (PROTONIX) 40 MG tablet, TAKE 1 TABLET BY MOUTH EVERY DAY, Disp: 30 tablet, Rfl: 11   polyethylene glycol (MIRALAX) 17 g packet, Take 17 g by mouth daily. (Patient not taking: Reported on 02/02/2023), Disp: 14 each, Rfl: 0  Review of Systems:    Negative except for what is stated in HPI.  Physical Exam:  Vitals:   03/08/23 0958 03/08/23 1058  BP: (!) 134/92 134/87  Pulse: 92 88  Temp: 98.7 F (37.1 C)   TempSrc: Oral   SpO2: 99%   Weight: 289 lb 4.8 oz (131.2 kg)   Height: 5\' 9"  (1.753 m)    General: Patient is sitting comfortably in the room  Head: Normocephalic, atraumatic  Cardio: Regular rate and rhythm, no murmurs, rubs or gallops. 2+ pulses to bilateral upper and lower extremities  Pulmonary: Clear to ausculation bilaterally with no rales, rhonchi, and crackles    Assessment & Plan:   Benign essential hypertension Patient was recently discharged from the hospital about 1 month ago, and taken off of his olmesartan 20 mg daily.  Patient was to continue to take Norvasc 10 mg daily, metoprolol succinate 100 mg daily, and hydralazine 25 mg twice daily.  He states he stopped taking his amlodipine, and ran out of his metoprolol.  He continued on hydralazine 25 mg twice daily.  Blood pressure today is 134/87 and 134/92.  He did take his hydralazine this morning.  Given he is close to where I want him and only on hydralazine, I do not think he needs to be on metoprolol succinate 100 mg anymore.  I do recommend him continue on his hydralazine as well as his Norvasc.  Plan: -Continue amlodipine 10 mg daily -Continue hydralazine 25 mg twice daily -Discontinue metoprolol succinate 100 mg daily -BMP pending  Type 2 diabetes mellitus with complications (HCC) Patient A1c up to 8.1 from 7.6 3 months ago.  He states he is compliant with his medication.  His  current regimen includes metformin 1000 mg twice daily, empagliflozin 25 mg daily, Mounjaro 5 mg weekly.  Microalbumin collected today.  Patient has plans for ophthalmology in July.  Will increase Mounjaro as empagliflozin and metformin maxed out.  Plan: -Continue metformin 1000 mg twice daily -Continue empagliflozin 25 mg daily -Increase Mounjaro to 7.5 mg weekly -Continue atorvastatin 80 mg daily -Urine microalbumin/creatinine ratio at next visit -BMP today -Follow-up in 1 month for potentially titrating up mounjaro  Chronic kidney disease, stage 3 (HCC) Patient was recently seen in the hospital for AKI on CKD.  Patient also had hyperkalemia.  Creatinine did come back to  baseline.  Baseline is around 1.3-1.5.  Plan: -Refer to nephrology -BMP pending  Hyperkalemia Patient presenting for hospital follow-up for hyperkalemia.  Patient seems to have recurrent hyperkalemia.  Will recheck BMP today.  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 Patient has been taken off of olmesartan.  Plan: -Plan to refer to nephrology -Check renin aldosterone ratio today -Recheck potassium today  Addendum:  -Potassium within normal limits, aldosterone levels within normal limits.   Patient discussed with Dr. Letta Kocher, DO PGY-1 Internal Medicine Resident  Pager: [phone]

## 2023-03-08 NOTE — Patient Instructions (Addendum)
Douglas Horne Jr.,Thank you for allowing me to take part in your care today.  Here are your instructions.  1. Regarding your Kidney concerns and your high potassium, we are going to check your kidney function today and we are going to check electrolytes today.   2. I have referred you for a kidney doctor. Please wait for the phone call to schedule.  3. I have checked labs, I will call you with the results.  4. Your A1c was 8.1 today. Please increase your mounjaro to 7.5 weekly for the next month and then return for follow up.  5. For your high blood pressure please keep taking amlodipine and hydralazine. Stop taking the metformin.   Thank you, Dr. Allena Katz  If you have any other questions please contact the internal medicine clinic at [phone]

## 2023-03-09 LAB — MICROALBUMIN / CREATININE URINE RATIO
Creatinine, Urine: 239.4 mg/dL
Microalb/Creat Ratio: 274 mg/g creat — ABNORMAL HIGH (ref 0–29)
Microalbumin, Urine: 655.6 ug/mL

## 2023-03-09 LAB — BMP8+ANION GAP
Chloride: 107 mmol/L — ABNORMAL HIGH (ref 96–106)
eGFR: 57 mL/min/{1.73_m2} — ABNORMAL LOW (ref >59)

## 2023-03-09 LAB — ALDOSTERONE + RENIN ACTIVITY W/ RATIO

## 2023-03-09 NOTE — Progress Notes (Signed)
Internal Medicine Clinic Attending  Case discussed with Dr. Patel  At the time of the visit.  We reviewed the resident's history and exam and pertinent patient test results.  I agree with the assessment, diagnosis, and plan of care documented in the resident's note.  

## 2023-03-12 LAB — BMP8+ANION GAP
BUN: 34 mg/dL — ABNORMAL HIGH (ref 6–24)
Glucose: 118 mg/dL — ABNORMAL HIGH (ref 70–99)
Potassium: 5.2 mmol/L (ref 3.5–5.2)

## 2023-03-14 LAB — BMP8+ANION GAP
Anion Gap: 14 mmol/L (ref 10.0–18.0)
BUN/Creatinine Ratio: 23 — ABNORMAL HIGH (ref 9–20)
CO2: 19 mmol/L — ABNORMAL LOW (ref 20–29)
Calcium: 9.4 mg/dL (ref 8.7–10.2)
Creatinine, Ser: 1.47 mg/dL — ABNORMAL HIGH (ref 0.76–1.27)
Sodium: 140 mmol/L (ref 134–144)

## 2023-03-14 LAB — ALDOSTERONE + RENIN ACTIVITY W/ RATIO: Aldos/Renin Ratio: 1.1 (ref 0.0–30.0)

## 2023-03-15 ENCOUNTER — Other Ambulatory Visit: Payer: Self-pay

## 2023-03-15 ENCOUNTER — Ambulatory Visit (INDEPENDENT_AMBULATORY_CARE_PROVIDER_SITE_OTHER): Payer: 59 | Admitting: Podiatry

## 2023-03-15 DIAGNOSIS — B351 Tinea unguium: Secondary | ICD-10-CM

## 2023-03-15 DIAGNOSIS — M79609 Pain in unspecified limb: Secondary | ICD-10-CM

## 2023-03-15 DIAGNOSIS — E118 Type 2 diabetes mellitus with unspecified complications: Secondary | ICD-10-CM

## 2023-03-15 NOTE — Progress Notes (Signed)
This patient returns to my office for at risk foot care.  This patient requires this care by a professional since this patient will be at risk due to having diabetes  coagulation defect and kidney disease.  Patient is prescribed eliquis.  This patient is unable t  o cut nails himself since the patient cannot reach his nails.These nails are painful walking and wearing shoes.  This patient has not been seen in over one year.This patient presents for at risk foot care today.  General Appearance  Alert, conversant and in no acute stress.  Vascular  Dorsalis pedis and posterior tibial  pulses are palpable  bilaterally.  Capillary return is within normal limits  bilaterally. Temperature is within normal limits  bilaterally.  Neurologic  Senn-Weinstein monofilament wire test within normal limits  bilaterally. Muscle power within normal limits bilaterally.  Nails Thick disfigured discolored nails with subungual debris  from hallux to fifth toes bilaterally. No evidence of bacterial infection or drainage bilaterally.  Orthopedic  No limitations of motion  feet .  No crepitus or effusions noted.  No bony pathology or digital deformities noted.  HAV  right foot.  Pes planus  B/L.  Skin  normotropic skin with no porokeratosis noted bilaterally.  No signs of infections or ulcers noted.     Onychomycosis  Pain in right toes  Pain in left toes  Consent was obtained for treatment procedures.   Mechanical debridement of nails 1-5  bilaterally performed with a nail nipper.  Filed with dremel without incident.    Return office visit    6 months                  Told patient to return for periodic foot care and evaluation due to potential at risk complications.   Ermin Parisien DPM   

## 2023-03-18 MED ORDER — HYDRALAZINE HCL 25 MG PO TABS: 25.0000 mg | ORAL_TABLET | Freq: Two times a day (BID) | ORAL | 0 refills | Status: DC

## 2023-03-19 ENCOUNTER — Telehealth: Payer: Self-pay | Admitting: Internal Medicine

## 2023-03-19 MED ORDER — AMPHETAMINE-DEXTROAMPHET ER 30 MG PO CP24: 30.0000 mg | ORAL_CAPSULE | Freq: Every day | ORAL | 0 refills | Status: DC | PRN

## 2023-03-19 NOTE — Telephone Encounter (Signed)
Adderall refilled

## 2023-03-19 NOTE — Telephone Encounter (Signed)
Patient states pharmacy says DEA number expired. Patient needs Adderall. Pharmacy is [facility]. Patient phone number is [phone].

## 2023-03-19 NOTE — Telephone Encounter (Signed)
Spoke with patient. Advised adderall has been sent to pharmacy. NFN

## 2023-03-20 ENCOUNTER — Encounter: Payer: Self-pay | Admitting: *Deleted

## 2023-03-20 MED ORDER — AMPHETAMINE-DEXTROAMPHET ER 30 MG PO CP24: 30.0000 mg | ORAL_CAPSULE | Freq: Every day | ORAL | 0 refills | Status: DC | PRN

## 2023-03-20 NOTE — Telephone Encounter (Signed)
Yes that's ok- Sent RX

## 2023-03-20 NOTE — Telephone Encounter (Signed)
Spoke with patient and informed medication was resent to the pharmacy. Informed patient to contact pharmacy to ensure medication is ready to be picked up. Nothing further needed.

## 2023-03-20 NOTE — Telephone Encounter (Signed)
Patient called back- Dr. Maple Hudson is in the process of renewing DEA- Beth can you assist?

## 2023-03-22 ENCOUNTER — Other Ambulatory Visit: Payer: Self-pay | Admitting: Internal Medicine

## 2023-03-22 DIAGNOSIS — I10 Essential (primary) hypertension: Secondary | ICD-10-CM

## 2023-04-09 ENCOUNTER — Other Ambulatory Visit: Payer: Self-pay

## 2023-04-09 ENCOUNTER — Ambulatory Visit (INDEPENDENT_AMBULATORY_CARE_PROVIDER_SITE_OTHER): Payer: 59 | Admitting: Student

## 2023-04-09 ENCOUNTER — Encounter: Payer: Self-pay | Admitting: Student

## 2023-04-09 VITALS — BP 120/76 | HR 78 | Temp 98.2°F | Ht 69.0 in | Wt 294.3 lb

## 2023-04-09 DIAGNOSIS — E875 Hyperkalemia: Secondary | ICD-10-CM | POA: Diagnosis not present

## 2023-04-09 DIAGNOSIS — E118 Type 2 diabetes mellitus with unspecified complications: Secondary | ICD-10-CM

## 2023-04-09 DIAGNOSIS — R0981 Nasal congestion: Secondary | ICD-10-CM

## 2023-04-09 DIAGNOSIS — I10 Essential (primary) hypertension: Secondary | ICD-10-CM

## 2023-04-09 LAB — POCT GLYCOSYLATED HEMOGLOBIN (HGB A1C): Hemoglobin A1C: 7.4 % — AB (ref 4.0–5.6)

## 2023-04-09 LAB — GLUCOSE, CAPILLARY: Glucose-Capillary: 132 mg/dL — ABNORMAL HIGH (ref 70–99)

## 2023-04-09 NOTE — Patient Instructions (Addendum)
  Thank you, [name], for allowing [place] to provide your care today. Today we discussed . . .  > Blood pressure       -You may patient is doing well today but we need to make sure that we know what medications you are taking.  When you get home please make a list of the medications that you have been taking and I will call you with the results of your blood test and go of your medications with you at that time.  For now please continue to take medications that you have been taking at home. > Diabetes       -Your A1c today is down to 7.4.  You are doing a great job on your current medications and we will continue metformin 1 g twice daily, Jardiance 25 mg daily, and Mounjaro 7.5 mg weekly.  We will have you back in 3 months to recheck your A1c. > Nasal congestion       -Please use saline nasal spray in both nostrils at night in addition to your Flonase and you may take cetirizine before bed as well.  Please also start using the humidifier on your CPAP machine.  If you have any difficulty breathing, inability to wear your CPAP, or other worse symptoms please let our office know.   I have ordered the following labs for you:   Lab Orders         BMP8+Anion Gap         POC Hbg A1C        Referrals ordered today:   Referral Orders  No referral(s) requested today      I have ordered the following medication/changed the following medications:   Stop the following medications: There are no discontinued medications.   Start the following medications: No orders of the defined types were placed in this encounter.     Follow up: 3 months    Remember:     Should you have any questions or concerns please call the internal medicine clinic at [phone].     Douglas Morel, DO [facility]

## 2023-04-09 NOTE — Assessment & Plan Note (Addendum)
About 3 weeks ago the patient dropped a speaker on his nose and had a brief nosebleed and nasal pain.  No further nosebleeds and pain has almost completely resolved.  However he has been having nasal congestion especially at night which wakes him up.  He takes Flonase nightly and sometimes takes cetirizine.  He does have the equipment for humidified CPAP but does not use it at the moment.  Exam is overall benign without any obvious internal or external deformity or severe septal deviation. - Continue Flonase, as needed cetirizine, add saline nasal spray as needed, and use humidified air in CPAP

## 2023-04-09 NOTE — Assessment & Plan Note (Addendum)
Please see benign essential hypertension.  Patient might have resumed olmesartan.  Last potassium when off ARB was 5.2.  There has been concern for type IV RTA with hyperkalemia and metabolic acidosis but no confirmatory urine testing noted.  We will repeat a BMP today and discontinue ARB if needed.

## 2023-04-09 NOTE — Assessment & Plan Note (Signed)
Blood pressure stable today at 120/76.  However there is some confusion with his medications.  At his last visit 4 weeks ago he was told to take amlodipine 10 mg daily and hydralazine 25 mg twice daily.  Olmesartan had been discontinued due to hyperkalemia 2 months ago and he had ran out of his metoprolol and amlodipine but was still having near normal blood pressures at his last visit 1 month ago.  He does not have his medications with him but he is pretty sure he is taking olmesartan, amlodipine, hydralazine, and maybe metoprolol. - BMP today, will discuss medications when I call with results - Continue home medications, will addend after BMP results and medication review

## 2023-04-09 NOTE — Progress Notes (Signed)
CC: Follow-up T2DM and hyperkalemia  HPI:  [name]. is a 52 y.o. male with PMH as below who presents to the clinic for follow-up visit.  Please see assessment and plan for further details.  Past Medical History:  Diagnosis Date   Diabetes mellitus    GERD (gastroesophageal reflux disease)    History of alcohol abuse    History of cocaine use    History of echocardiogram    a. Echo 4/14: Moderate LVH, vigorous LVEF, EF 65-70%, normal wall motion, grade 2 diastolic dysfunction, mildly dilated aortic root and ascending aorta, ascending aorta 40 mm, aortic root 38 mm, mild LAE   History of pancreatitis 01/22/2019   Hx of cardiovascular stress test    a. GXT 5/14: No ischemic changes  //  b. ETT-Myoview 3/16:  Low risk, no ischemia, EF 58%   Hyperlipidemia    Hypertension    Morbid obesity (HCC)    Paroxysmal atrial fibrillation (HCC)    Occurring in 2008, with several recurrence since then (including in the setting of + cocaine on UDS).   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  Physical Exam:  Vitals:   04/09/23 1008 04/09/23 1042  BP: 135/74 120/76  Pulse: 83 78  Temp: 98.2 F (36.8 C)   TempSrc: Oral   SpO2: 98%   Weight: 294 lb 4.8 oz (133.5 kg)   Height: 5\' 9"  (1.753 m)     Constitutional: Well-appearing middle-age male. In no acute distress. Nose: No significant asymmetry, mild tenderness over nasal cartilage, no tenderness over nasal bone.  No significant abnormality on nostril exam. Pulm:Normal work of breathing on room air. VHQ:IONGEXBM for extremity edema. Skin:Warm and dry. Neuro:Alert and oriented x3. No focal deficit noted. Psych:Pleasant mood and affect.   Assessment & Plan:   Benign essential hypertension Blood pressure stable today at 120/76.  However there is some confusion with his  medications.  At his last visit 4 weeks ago he was told to take amlodipine 10 mg daily and hydralazine 25 mg twice daily.  Olmesartan had been discontinued due to hyperkalemia 2 months ago and he had ran out of his metoprolol and amlodipine but was still having near normal blood pressures at his last visit 1 month ago.  He does not have his medications with him but he is pretty sure he is taking olmesartan, amlodipine, hydralazine, and maybe metoprolol. - BMP today, will discuss medications when I call with results - Continue home medications, will addend after BMP results and medication review  Hyperkalemia Please see benign essential hypertension.  Patient might have resumed olmesartan.  Last potassium when off ARB was 5.2.  There has been concern for type IV RTA with hyperkalemia and metabolic acidosis but no confirmatory urine testing noted.  We will repeat a BMP today and discontinue ARB if needed.  Nasal congestion About 3 weeks ago the patient dropped a speaker on his nose and had a brief nosebleed and nasal pain.  No further nosebleeds and pain has almost completely resolved.  However he has been having nasal congestion especially at night which wakes him up.  He takes Flonase nightly and sometimes takes cetirizine.  He does have the equipment for humidified CPAP but does not use it at the moment.  Exam is overall benign without any obvious internal or external deformity or severe septal deviation. - Continue Flonase, as needed cetirizine, add saline nasal spray as needed, and use humidified air in CPAP  Type 2 diabetes mellitus with complications (HCC) A1c today down to 7.4 from 8.1 last month after increasing Mounjaro to 7.5 mg weekly.  Urine microalbumin creatinine ratio was elevated 274.  He has had hyperkalemia and was supposed to have stopped his ARB but seems to have resumed this since his last visit.  We are getting a BMP today and will discuss continuation versus discontinuation based on  results. - Continue metformin 1 g twice daily, Jardiance 25 mg daily, and Mounjaro 7.5 mg weekly - Continue or discontinue ARB based on BMP today, if hyperkalemic or significant AKI - Return in 3 months for repeat A1c    Patient discussed with Dr. Dimas Chyle, DO [facility] Internal Medicine Resident PGY-1 Pager: [phone]

## 2023-04-09 NOTE — Assessment & Plan Note (Signed)
A1c today down to 7.4 from 8.1 last month after increasing Mounjaro to 7.5 mg weekly.  Urine microalbumin creatinine ratio was elevated 274.  He has had hyperkalemia and was supposed to have stopped his ARB but seems to have resumed this since his last visit.  We are getting a BMP today and will discuss continuation versus discontinuation based on results. - Continue metformin 1 g twice daily, Jardiance 25 mg daily, and Mounjaro 7.5 mg weekly - Continue or discontinue ARB based on BMP today, if hyperkalemic or significant AKI - Return in 3 months for repeat A1c

## 2023-04-10 LAB — BMP8+ANION GAP
Anion Gap: 13 mmol/L (ref 10.0–18.0)
BUN/Creatinine Ratio: 18 (ref 9–20)
BUN: 28 mg/dL — ABNORMAL HIGH (ref 6–24)
CO2: 19 mmol/L — ABNORMAL LOW (ref 20–29)
Calcium: 9.1 mg/dL (ref 8.7–10.2)
Chloride: 108 mmol/L — ABNORMAL HIGH (ref 96–106)
Creatinine, Ser: 1.54 mg/dL — ABNORMAL HIGH (ref 0.76–1.27)
Glucose: 123 mg/dL — ABNORMAL HIGH (ref 70–99)
Potassium: 5.1 mmol/L (ref 3.5–5.2)
Sodium: 140 mmol/L (ref 134–144)
eGFR: 54 mL/min/{1.73_m2} — ABNORMAL LOW (ref >59)

## 2023-04-12 ENCOUNTER — Other Ambulatory Visit: Payer: Self-pay | Admitting: Student

## 2023-04-12 NOTE — Progress Notes (Signed)
Called and spoke to the patient about his BMP.  Renal function and potassium are stable.  Also reviewed his medication list.  He is taking amlodipine 10 mg daily and hydralazine 25 mg twice daily.  He is not taking olmesartan.  With his blood pressure being normal on his current regimen we will not make any changes at this time and will reevaluate at his next visit.

## 2023-04-16 ENCOUNTER — Other Ambulatory Visit: Payer: Self-pay

## 2023-04-16 ENCOUNTER — Other Ambulatory Visit (INDEPENDENT_AMBULATORY_CARE_PROVIDER_SITE_OTHER): Payer: 59

## 2023-04-16 DIAGNOSIS — E118 Type 2 diabetes mellitus with unspecified complications: Secondary | ICD-10-CM

## 2023-04-16 LAB — COMPREHENSIVE METABOLIC PANEL
ALT: 33 U/L (ref 0–53)
AST: 23 U/L (ref 0–37)
Albumin: 4.1 g/dL (ref 3.5–5.2)
Alkaline Phosphatase: 83 U/L (ref 39–117)
BUN: 25 mg/dL — ABNORMAL HIGH (ref 6–23)
CO2: 21 mEq/L (ref 19–32)
Calcium: 9.4 mg/dL (ref 8.4–10.5)
Chloride: 106 mEq/L (ref 96–112)
Creatinine, Ser: 1.6 mg/dL — ABNORMAL HIGH (ref 0.40–1.50)
GFR: 49.26 mL/min — ABNORMAL LOW (ref >60.00)
Glucose, Bld: 98 mg/dL (ref 70–99)
Potassium: 4.8 mEq/L (ref 3.5–5.1)
Sodium: 137 mEq/L (ref 135–145)
Total Bilirubin: 0.7 mg/dL (ref 0.2–1.2)
Total Protein: 7.8 g/dL (ref 6.0–8.3)

## 2023-04-16 LAB — LIPID PANEL
Cholesterol: 90 mg/dL (ref 0–200)
HDL: 26.6 mg/dL — ABNORMAL LOW (ref >39.00)
LDL Cholesterol: 40 mg/dL (ref 0–99)
NonHDL: 63.56
Total CHOL/HDL Ratio: 3
Triglycerides: 117 mg/dL (ref 0.0–149.0)
VLDL: 23.4 mg/dL (ref 0.0–40.0)

## 2023-04-16 LAB — HEMOGLOBIN A1C: Hgb A1c MFr Bld: 7.5 % — ABNORMAL HIGH (ref 4.6–6.5)

## 2023-04-18 ENCOUNTER — Ambulatory Visit: Payer: 59 | Admitting: "Endocrinology

## 2023-04-18 ENCOUNTER — Telehealth: Payer: Self-pay | Admitting: Internal Medicine

## 2023-04-18 NOTE — Progress Notes (Signed)
Internal Medicine Clinic Attending  Case discussed with Dr. Goodwin  at the time of the visit.  We reviewed the resident's history and exam and pertinent patient test results.  I agree with the assessment, diagnosis, and plan of care documented in the resident's note.  

## 2023-04-18 NOTE — Telephone Encounter (Signed)
Patient states needs refill for Adderall. [facility]acy [facility] [address] Patient phone number is [phone]. 

## 2023-04-20 MED ORDER — AMPHETAMINE-DEXTROAMPHET ER 30 MG PO CP24: 30.0000 mg | ORAL_CAPSULE | Freq: Every day | ORAL | 0 refills | Status: DC | PRN

## 2023-04-20 NOTE — Telephone Encounter (Signed)
Pt is checking on refill he is ware we are waiting on response from Dr. Maple Hudson

## 2023-04-20 NOTE — Telephone Encounter (Signed)
Spoke with patient. Advised Adderall prescription has been sent to pharmacy. NFN 

## 2023-04-20 NOTE — Telephone Encounter (Signed)
Adderall refilled

## 2023-04-20 NOTE — Telephone Encounter (Signed)
Dr. Maple Hudson patient is requesting refill on Adderall 30mg 

## 2023-05-21 ENCOUNTER — Other Ambulatory Visit: Payer: Self-pay | Admitting: Internal Medicine

## 2023-05-21 ENCOUNTER — Other Ambulatory Visit: Payer: Self-pay

## 2023-05-22 ENCOUNTER — Other Ambulatory Visit: Payer: Self-pay | Admitting: Internal Medicine

## 2023-05-22 MED ORDER — HYDRALAZINE HCL 25 MG PO TABS: 25.0000 mg | ORAL_TABLET | Freq: Two times a day (BID) | ORAL | 3 refills | Status: DC

## 2023-05-22 MED ORDER — AMPHETAMINE-DEXTROAMPHET ER 30 MG PO CP24: 30.0000 mg | ORAL_CAPSULE | Freq: Every day | ORAL | 0 refills | Status: DC | PRN

## 2023-05-22 NOTE — Telephone Encounter (Signed)
Adderall refilled

## 2023-05-22 NOTE — Telephone Encounter (Signed)
Patient is requesting a refill for Adderall XR 30mg  to be sent to [facility] [address]   Adderall XR 30mg , last refill 04/20/23 #30, no refills   Message routed to Dr. Maple Hudson to advise

## 2023-06-15 ENCOUNTER — Ambulatory Visit: Payer: 59 | Admitting: Podiatry

## 2023-06-22 MED ORDER — AMPHETAMINE-DEXTROAMPHET ER 30 MG PO CP24: 30.0000 mg | ORAL_CAPSULE | Freq: Every day | ORAL | 0 refills | Status: DC | PRN

## 2023-06-22 NOTE — Telephone Encounter (Signed)
Patient is calling again for a refill of his adderall.   [facility] [address]

## 2023-06-22 NOTE — Telephone Encounter (Signed)
Adderall refill sent.

## 2023-07-09 ENCOUNTER — Other Ambulatory Visit: Payer: Self-pay

## 2023-07-09 DIAGNOSIS — E1165 Type 2 diabetes mellitus with hyperglycemia: Secondary | ICD-10-CM

## 2023-07-09 MED ORDER — METFORMIN HCL 1000 MG PO TABS: 1000.0000 mg | ORAL_TABLET | Freq: Two times a day (BID) | ORAL | 11 refills | Status: DC

## 2023-07-09 NOTE — Addendum Note (Signed)
Addended by: Rocky Morel on: 07/09/2023 01:41 PM   Modules accepted: Orders

## 2023-07-24 ENCOUNTER — Telehealth: Payer: Self-pay | Admitting: Internal Medicine

## 2023-07-24 ENCOUNTER — Telehealth: Payer: Self-pay

## 2023-07-24 DIAGNOSIS — E1165 Type 2 diabetes mellitus with hyperglycemia: Secondary | ICD-10-CM

## 2023-07-24 MED ORDER — METFORMIN HCL 1000 MG PO TABS
1000.0000 mg | ORAL_TABLET | Freq: Two times a day (BID) | ORAL | 11 refills | Status: DC
Start: 2023-07-24 — End: 2023-07-24

## 2023-07-24 MED ORDER — METFORMIN HCL 1000 MG PO TABS: 1000.0000 mg | ORAL_TABLET | Freq: Two times a day (BID) | ORAL | 11 refills | Status: DC

## 2023-07-24 MED ORDER — AMPHETAMINE-DEXTROAMPHET ER 30 MG PO CP24: 30.0000 mg | ORAL_CAPSULE | Freq: Every day | ORAL | 0 refills | Status: DC | PRN

## 2023-07-24 NOTE — Telephone Encounter (Signed)
Patient is aware of below message and voiced his understanding.  Nothing further needed.   

## 2023-07-24 NOTE — Telephone Encounter (Signed)
PT calling in for his mo. Refill of Adderal  [facility] [address]  [phone]

## 2023-07-24 NOTE — Telephone Encounter (Signed)
Adderall refilled

## 2023-07-24 NOTE — Telephone Encounter (Signed)
Medication rx changed from print to normal

## 2023-07-24 NOTE — Telephone Encounter (Signed)
Called and spoke to patient.  He is requesting refill on Adderrall 30mg . Last refilled 06/19/2023 #30 with 0 refills.  Last OV 01/01/2023 with pending appt for 01/08/2024. Pref[facility] [address]   Dr. Maple Hudson, please advise. Thanks

## 2023-07-31 ENCOUNTER — Ambulatory Visit: Payer: 59 | Admitting: "Endocrinology

## 2023-07-31 ENCOUNTER — Encounter: Payer: Self-pay | Admitting: "Endocrinology

## 2023-07-31 VITALS — BP 140/90 | HR 78 | Ht 69.0 in | Wt 288.0 lb

## 2023-07-31 DIAGNOSIS — Z7984 Long term (current) use of oral hypoglycemic drugs: Secondary | ICD-10-CM | POA: Diagnosis not present

## 2023-07-31 DIAGNOSIS — E782 Mixed hyperlipidemia: Secondary | ICD-10-CM | POA: Diagnosis not present

## 2023-07-31 DIAGNOSIS — Z7985 Long-term (current) use of injectable non-insulin antidiabetic drugs: Secondary | ICD-10-CM | POA: Diagnosis not present

## 2023-07-31 DIAGNOSIS — E1165 Type 2 diabetes mellitus with hyperglycemia: Secondary | ICD-10-CM

## 2023-07-31 LAB — POCT GLYCOSYLATED HEMOGLOBIN (HGB A1C): Hemoglobin A1C: 7.3 % — AB (ref 4.0–5.6)

## 2023-07-31 MED ORDER — LANCET DEVICE MISC
1.0000 | Freq: Three times a day (TID) | 0 refills | Status: AC
Start: 2023-07-31 — End: 2023-08-30

## 2023-07-31 MED ORDER — AMLODIPINE BESYLATE 10 MG PO TABS
10.0000 mg | ORAL_TABLET | Freq: Every day | ORAL | 0 refills | Status: DC
Start: 2023-07-31 — End: 2023-09-04

## 2023-07-31 MED ORDER — LANCETS MISC. MISC
1.0000 | Freq: Three times a day (TID) | 3 refills | Status: AC
Start: 2023-07-31 — End: 2023-08-30

## 2023-07-31 MED ORDER — BLOOD GLUCOSE MONITORING SUPPL DEVI
1.0000 | Freq: Three times a day (TID) | 0 refills | Status: AC
Start: 2023-07-31 — End: ?

## 2023-07-31 MED ORDER — BLOOD GLUCOSE TEST VI STRP
1.0000 | ORAL_STRIP | Freq: Three times a day (TID) | 3 refills | Status: AC
Start: 2023-07-31 — End: 2023-12-11

## 2023-07-31 MED ORDER — TIRZEPATIDE 2.5 MG/0.5ML ~~LOC~~ SOAJ
2.5000 mg | SUBCUTANEOUS | 3 refills | Status: DC
Start: 2023-07-31 — End: 2023-09-19

## 2023-07-31 MED ORDER — EMPAGLIFLOZIN 25 MG PO TABS
25.0000 mg | ORAL_TABLET | Freq: Every day | ORAL | 2 refills | Status: DC
Start: 2023-07-31 — End: 2024-08-04

## 2023-07-31 NOTE — Patient Instructions (Signed)

## 2023-07-31 NOTE — Progress Notes (Signed)
Outpatient Endocrinology Note Douglas Litchfield, MD  07/31/23   [name]. [birth year] [id number]  Referring Provider: Waymon Budge, MD Primary Care Provider: Rocky Morel, DO Reason for consultation: Subjective   Assessment & Plan  Diagnoses and all orders for this visit:  Long term (current) use of oral hypoglycemic drugs  Type 2 diabetes mellitus with hyperglycemia, unspecified whether long term insulin use (HCC) -     POCT glycosylated hemoglobin (Hb A1C) -     Blood Glucose Monitoring Suppl DEVI; 1 each by Does not apply route in the morning, at noon, and at bedtime. May substitute to any manufacturer covered by patient's insurance. -     Glucose Blood (BLOOD GLUCOSE TEST STRIPS) STRP; 1 each by In Vitro route in the morning, at noon, and at bedtime. May substitute to any manufacturer covered by patient's insurance. -     Lancet Device MISC; 1 each by Does not apply route in the morning, at noon, and at bedtime. May substitute to any manufacturer covered by patient's insurance. -     Lancets Misc. MISC; 1 each by Does not apply route in the morning, at noon, and at bedtime. May substitute to any manufacturer covered by patient's insurance. -     tirzepatide [facility]) 2.5 MG/0.5ML Pen; Inject 2.5 mg into the skin once a week. -     amLODipine (NORVASC) 10 MG tablet; Take 1 tablet (10 mg total) by mouth daily.  Long-term (current) use of injectable non-insulin antidiabetic drugs  Mixed hypercholesterolemia and hypertriglyceridemia  Type 2 diabetes mellitus with hyperglycemia, without long-term current use of insulin (HCC) -     empagliflozin (JARDIANCE) 25 MG TABS tablet; Take 1 tablet (25 mg total) by mouth daily.    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,  Lab Results  Component Value Date   GFR 49.26 (L) 04/16/2023   Hba1c goal less than 7, current Hba1c is  Lab Results  Component Value Date   HGBA1C 7.3 (A) 07/31/2023   Will  recommend the following: Metformin 1000 mg bid Jardiance 25 mg every day  Mounjaro 2.5 mg/week  No known contraindications/side effects to any of above medications No history of MEN syndrome/medullary thyroid cancer/pancreatitis or pancreatic cancer in self or family  -Last LD and Tg are as follows: Lab Results  Component Value Date   LDLCALC 40 04/16/2023    Lab Results  Component Value Date   TRIG 117.0 04/16/2023   -On atorvastatin 80 mg QD -Follow low fat diet and exercise   -Blood pressure goal <140/90 - Microalbumin/creatinine goal is < 30 -Last MA/Cr is as follows: Lab Results  Component Value Date   MICROALBUR 18.5 (H) 12/08/2016   -on ACE/ARB olmesartan 20 gm every day  -diet changes including salt restriction -limit eating outside -counseled BP targets per standards of diabetes care -uncontrolled blood pressure can lead to retinopathy, nephropathy and cardiovascular and atherosclerotic heart disease  Reviewed and counseled on: -A1C target -Blood sugar targets -Complications of uncontrolled diabetes  -Checking blood sugar before meals and bedtime and bring log next visit -All medications with mechanism of action and side effects -Hypoglycemia management: rule of 15's, Glucagon Emergency Kit and medical alert ID -low-carb low-fat plate-method diet -At least 20 minutes of physical activity per day -Annual dilated retinal eye exam and foot exam -compliance and follow up needs -follow up as scheduled or earlier if problem gets worse  Call if blood sugar is less than 70 or  consistently above 250    Take a 15 gm snack of carbohydrate at bedtime before you go to sleep if your blood sugar is less than 100.    If you are going to fast after midnight for a test or procedure, ask your physician for instructions on how to reduce/decrease your insulin dose.    Call if blood sugar is less than 70 or consistently above 250  -Treating a low sugar by rule of 15  (15 gms  of sugar every 15 min until sugar is more than 70) If you feel your sugar is low, test your sugar to be sure If your sugar is low (less than 70), then take 15 grams of a fast acting Carbohydrate (3-4 glucose tablets or glucose gel or 4 ounces of juice or regular soda) Recheck your sugar 15 min after treating low to make sure it is more than 70 If sugar is still less than 70, treat again with 15 grams of carbohydrate          Don't drive the hour of hypoglycemia  If unconscious/unable to eat or drink by mouth, use glucagon injection or nasal spray baqsimi and call 911. Can repeat again in 15 min if still unconscious.  Return in about 3 months (around 10/31/2023).   I have reviewed current medications, nurse's notes, allergies, vital signs, past medical and surgical history, family medical history, and social history for this encounter. Counseled patient on symptoms, examination findings, lab findings, imaging results, treatment decisions and monitoring and prognosis. The patient understood the recommendations and agrees with the treatment plan. All questions regarding treatment plan were fully answered.  Douglas [place], MD  07/31/23    History of Present Illness [name]. is a 52 y.o. year old male who presents for evaluation of Type II diabetes mellitus.  [name]. was first diagnosed at age 52.   Diabetes education +  Home diabetes regimen: Metformin 1000 mg bid Jardiance 25 mg every day  Mounjaro 2.5 mg /week  COMPLICATIONS +  MI/Stroke -  retinopathy -  neuropathy + nephropathy  SYMPTOMS REVIEWED + Polyuria - Weight loss - Blurred vision  BLOOD SUGAR DATA Doesn't check BG  Physical Exam  BP (!) 140/90   Pulse 78   Ht 5\' 9"  (1.753 m)   Wt 288 lb (130.6 kg)   SpO2 98%   BMI 42.53 kg/m    Constitutional: well developed, well nourished Head: normocephalic, atraumatic Eyes: sclera anicteric, no redness Neck: supple Lungs: normal  respiratory effort Neurology: alert and oriented Skin: dry, no appreciable rashes Musculoskeletal: no appreciable defects Psychiatric: normal mood and affect Diabetic Foot Exam - Simple   No data filed      Current Medications Patient's Medications  New Prescriptions   BLOOD GLUCOSE MONITORING SUPPL DEVI    1 each by Does not apply route in the morning, at noon, and at bedtime. May substitute to any manufacturer covered by patient's insurance.   GLUCOSE BLOOD (BLOOD GLUCOSE TEST STRIPS) STRP    1 each by In Vitro route in the morning, at noon, and at bedtime. May substitute to any manufacturer covered by patient's insurance.   LANCET DEVICE MISC    1 each by Does not apply route in the morning, at noon, and at bedtime. May substitute to any manufacturer covered by patient's insurance.   LANCETS MISC. MISC    1 each by Does not apply route in the morning, at noon, and at bedtime.  May substitute to any manufacturer covered by patient's insurance.   TIRZEPATIDE (MOUNJARO) 2.5 MG/0.5ML PEN    Inject 2.5 mg into the skin once a week.  Previous Medications   ACCU-CHEK SOFTCLIX LANCETS LANCETS    TEST UP TO 4 TIMES A DAY   ACETAMINOPHEN (TYLENOL) 325 MG TABLET    Take 2 tablets (650 mg total) by mouth every 6 (six) hours as needed for mild pain (or Fever >/= 101).   AMPHETAMINE-DEXTROAMPHETAMINE (ADDERALL XR) 30 MG 24 HR CAPSULE    Take 1 capsule (30 mg total) by mouth daily as needed (focus). 1 daily as needed   APIXABAN (ELIQUIS) 5 MG TABS TABLET    Take 1 tablet (5 mg total) by mouth 2 (two) times daily.   ATORVASTATIN (LIPITOR) 80 MG TABLET    TAKE 1 TABLET BY MOUTH EVERY DAY   CETIRIZINE (ZYRTEC ALLERGY) 10 MG TABLET    Take 1 tablet (10 mg total) by mouth daily.   FLECAINIDE (TAMBOCOR) 100 MG TABLET    TAKE 1 TABLET BY MOUTH TWICE A DAY   GLUCOSE BLOOD (ACCU-CHEK GUIDE) TEST STRIP    Please use strips to test morning fasting sugars and one hour after each meal, total 4x daily   HYDRALAZINE  (APRESOLINE) 25 MG TABLET    Take 1 tablet (25 mg total) by mouth in the morning and at bedtime.   METFORMIN (GLUCOPHAGE) 1000 MG TABLET    Take 1 tablet (1,000 mg total) by mouth 2 (two) times daily with a meal.   OLMESARTAN (BENICAR) 20 MG TABLET    Take 20 mg by mouth daily.   PANTOPRAZOLE (PROTONIX) 40 MG TABLET    TAKE 1 TABLET BY MOUTH EVERY DAY  Modified Medications   Modified Medication Previous Medication   AMLODIPINE (NORVASC) 10 MG TABLET amLODipine (NORVASC) 10 MG tablet      Take 1 tablet (10 mg total) by mouth daily.    TAKE 1 TABLET BY MOUTH EVERY DAY   EMPAGLIFLOZIN (JARDIANCE) 25 MG TABS TABLET empagliflozin (JARDIANCE) 25 MG TABS tablet      Take 1 tablet (25 mg total) by mouth daily.    TAKE 1 TABLET (25 MG TOTAL) BY MOUTH DAILY.  Discontinued Medications   No medications on file    Allergies Allergies  Allergen Reactions   Shrimp [Shellfish Allergy] Anaphylaxis   Banana Other (See Comments)    Pt gags   Watermelon Flavor Nausea And Vomiting   Almond Oil Itching    Roof of mouth itches   Other Itching    Grapes cause itching   Peanut-Containing Drug Products Itching and Cough   Sulfa Antibiotics Itching    Past Medical History Past Medical History:  Diagnosis Date   Diabetes mellitus    GERD (gastroesophageal reflux disease)    History of alcohol abuse    History of cocaine use    History of echocardiogram    a. Echo 4/14: Moderate LVH, vigorous LVEF, EF 65-70%, normal wall motion, grade 2 diastolic dysfunction, mildly dilated aortic root and ascending aorta, ascending aorta 40 mm, aortic root 38 mm, mild LAE   History of pancreatitis 01/22/2019   Hx of cardiovascular stress test    a. GXT 5/14: No ischemic changes  //  b. ETT-Myoview 3/16:  Low risk, no ischemia, EF 58%   Hyperlipidemia    Hypertension    Morbid obesity (HCC)    Paroxysmal atrial fibrillation (HCC)    Occurring in 2008, with several recurrence  since then (including in the setting of +  cocaine on UDS).   Sleep apnea    uses CPAP   Stroke (HCC)    x3   SVT (supraventricular tachycardia) (HCC)    mid to long RP SVT 09/2019    Past Surgical History Past Surgical History:  Procedure Laterality Date   APPENDECTOMY     ATRIAL FIBRILLATION ABLATION N/A 11/27/2019   Procedure: ATRIAL FIBRILLATION ABLATION;  Surgeon: [name], MD;  Location: [facility] INVASIVE [facility] LAB;  Service: Cardiovascular;  Laterality: N/A;   BUBBLE STUDY  05/20/2021   Procedure: BUBBLE STUDY;  Surgeon: Little Ishikawa, MD;  Location: [facility] ENDOSCOPY;  Service: Cardiovascular;;   ROTATOR CUFF REPAIR     SVT ABLATION N/A 11/27/2019   Procedure: SVT ABLATION;  Surgeon: [name], MD;  Location: [facility] CV [facility];  Service: Cardiovascular;  Laterality: N/A;   TEE WITHOUT CARDIOVERSION N/A 05/20/2021   Procedure: TRANSESOPHAGEAL ECHOCARDIOGRAM (TEE);  Surgeon: Little Ishikawa, MD;  Location: [facility] ENDOSCOPY;  Service: Cardiovascular;  Laterality: N/A;   TONSILLECTOMY      Family History family history includes Diabetes in his mother and paternal grandfather; Heart attack (age of onset: 32) in his father; Stroke in his maternal grandmother and paternal grandmother.  Social History Social History   Socioeconomic History   Marital status: Single    Spouse name: Not on file   Number of children: 0   Years of education: 12   Highest education level: Not on file  Occupational History   Occupation: Unemployed    Employer: UNEMPLOYED  Tobacco Use   Smoking status: Former    Current packs/day: 0.00    Average packs/day: 0.5 packs/day for 28.0 years (14.0 ttl pk-yrs)    Types: Cigarettes    Start date: 10/15/1994    Quit date: 10/15/2022    Years since quitting: 0.7   Smokeless tobacco: Former    Types: Snuff   Tobacco comments:    1 pack per week HEI 06/09/22  Vaping Use   Vaping status: Never Used  Substance and Sexual Activity   Alcohol use: Yes    Alcohol/week: 6.0 standard drinks of  alcohol    Types: 6 Standard drinks or equivalent per week    Comment: rarely   Drug use: Not Currently    Comment: cocaine in the past, none currently   Sexual activity: Not on file  Other Topics Concern   Not on file  Social History Narrative   Fun: Photographer       Lives in [place] with sister.      Unemployed, was working a Office manager job   Social Determinants of Corporate investment banker Strain: Not on file  Food Insecurity: No Food Insecurity (02/02/2023)   Hunger Vital Sign    Worried About Running Out of Food in the Last Year: Never true    Ran Out of Food in the Last Year: Never true  Transportation Needs: Unmet Transportation Needs (02/02/2023)   PRAPARE - Administrator, Civil Service (Medical): Yes    Lack of Transportation (Non-Medical): No  Physical Activity: Inactive (12/11/2022)   Exercise Vital Sign    Days of Exercise per Week: 0 days    Minutes of Exercise per Session: 0 min  Stress: Not on file  Social Connections: Socially Isolated (09/06/2022)   Social Connection and Isolation Panel [NHANES]    Frequency of Communication with Friends and Family: More than three times a week  Frequency of Social Gatherings with Friends and Family: More than three times a week    Attends Religious Services: Never    Active Member of Clubs or Organizations: No    Attends Banker Meetings: Never    Marital Status: Never married  Intimate Partner Violence: Not At Risk (02/02/2023)   Humiliation, Afraid, Rape, and Kick questionnaire    Fear of Current or Ex-Partner: No    Emotionally Abused: No    Physically Abused: No    Sexually Abused: No    Lab Results  Component Value Date   HGBA1C 7.3 (A) 07/31/2023   HGBA1C 7.5 (H) 04/16/2023   HGBA1C 7.4 (A) 04/09/2023   Lab Results  Component Value Date   CHOL 90 04/16/2023   Lab Results  Component Value Date   HDL 26.60 (L) 04/16/2023   Lab Results  Component Value Date   LDLCALC 40  04/16/2023   Lab Results  Component Value Date   TRIG 117.0 04/16/2023   Lab Results  Component Value Date   CHOLHDL 3 04/16/2023   Lab Results  Component Value Date   CREATININE 1.60 (H) 04/16/2023   Lab Results  Component Value Date   GFR 49.26 (L) 04/16/2023   Lab Results  Component Value Date   MICROALBUR 18.5 (H) 12/08/2016      Component Value Date/Time   NA 137 04/16/2023 1021   NA 140 04/09/2023 1047   K 4.8 04/16/2023 1021   CL 106 04/16/2023 1021   CO2 21 04/16/2023 1021   GLUCOSE 98 04/16/2023 1021   BUN 25 (H) 04/16/2023 1021   BUN 28 (H) 04/09/2023 1047   CREATININE 1.60 (H) 04/16/2023 1021   CREATININE 1.13 04/07/2016 0851   CALCIUM 9.4 04/16/2023 1021   PROT 7.8 04/16/2023 1021   ALBUMIN 4.1 04/16/2023 1021   AST 23 04/16/2023 1021   ALT 33 04/16/2023 1021   ALKPHOS 83 04/16/2023 1021   BILITOT 0.7 04/16/2023 1021   GFRNONAA 55 (L) 02/02/2023 0508   GFRAA 74 11/24/2020 1406      Latest Ref Rng & Units 04/16/2023   10:21 AM 04/09/2023   10:47 AM 03/08/2023   11:13 AM  BMP  Glucose 70 - 99 mg/dL 98  409  811   BUN 6 - 23 mg/dL 25  28  34   Creatinine 0.40 - 1.50 mg/dL 9.14  7.82  9.56   BUN/Creat Ratio 9 - 20  18  23    Sodium 135 - 145 mEq/L 137  140  140   Potassium 3.5 - 5.1 mEq/L 4.8  5.1  5.2   Chloride 96 - 112 mEq/L 106  108  107   CO2 19 - 32 mEq/L 21  19  19    Calcium 8.4 - 10.5 mg/dL 9.4  9.1  9.4        Component Value Date/Time   WBC 7.2 02/02/2023 0508   RBC 4.91 02/02/2023 0508   HGB 13.5 02/02/2023 0508   HGB 14.8 01/30/2023 1017   HCT 42.0 02/02/2023 0508   HCT 43.8 01/30/2023 1017   PLT 287 02/02/2023 0508   PLT 311 01/30/2023 1017   MCV 85.5 02/02/2023 0508   MCV 83 01/30/2023 1017   MCH 27.5 02/02/2023 0508   MCHC 32.1 02/02/2023 0508   RDW 13.5 02/02/2023 0508   RDW 13.8 01/30/2023 1017   LYMPHSABS 1.0 02/01/2023 1043   MONOABS 0.8 02/01/2023 1043   EOSABS 0.4 02/01/2023 1043   BASOSABS  0.1 02/01/2023 1043      Parts of this note may have been dictated using voice recognition software. There may be variances in spelling and vocabulary which are unintentional. Not all errors are proofread. Please notify the Thereasa Parkin if any discrepancies are noted or if the meaning of any statement is not clear.

## 2023-08-10 ENCOUNTER — Encounter: Payer: 59 | Admitting: Student

## 2023-08-13 ENCOUNTER — Ambulatory Visit: Payer: 59 | Admitting: Podiatry

## 2023-08-24 ENCOUNTER — Telehealth: Payer: Self-pay | Admitting: Internal Medicine

## 2023-08-24 NOTE — Telephone Encounter (Signed)
Patient is calling for his monthly refill of Adderall 30mg   Pharmacy [facility] [address]

## 2023-08-27 NOTE — Telephone Encounter (Signed)
Pt is still awaiting a prescription

## 2023-08-27 NOTE — Telephone Encounter (Signed)
Pt returning call for prescription

## 2023-08-28 MED ORDER — AMPHETAMINE-DEXTROAMPHET ER 30 MG PO CP24: 30.0000 mg | ORAL_CAPSULE | Freq: Every day | ORAL | 0 refills | Status: DC | PRN

## 2023-08-28 NOTE — Telephone Encounter (Signed)
Adderall refilled

## 2023-08-28 NOTE — Telephone Encounter (Signed)
Pt is calling back

## 2023-09-01 ENCOUNTER — Other Ambulatory Visit: Payer: Self-pay | Admitting: "Endocrinology

## 2023-09-01 DIAGNOSIS — E1165 Type 2 diabetes mellitus with hyperglycemia: Secondary | ICD-10-CM

## 2023-09-05 ENCOUNTER — Other Ambulatory Visit: Payer: Self-pay | Admitting: Student

## 2023-09-05 DIAGNOSIS — K219 Gastro-esophageal reflux disease without esophagitis: Secondary | ICD-10-CM

## 2023-09-05 NOTE — Telephone Encounter (Signed)
LOV 04/09/23. Pt was called to schedule his next appt - no answer; left message to call the office to schedule an appt.

## 2023-09-14 ENCOUNTER — Other Ambulatory Visit: Payer: Self-pay | Admitting: Student

## 2023-09-14 DIAGNOSIS — I480 Paroxysmal atrial fibrillation: Secondary | ICD-10-CM

## 2023-09-17 ENCOUNTER — Telehealth: Payer: Self-pay | Admitting: Student

## 2023-09-17 DIAGNOSIS — I480 Paroxysmal atrial fibrillation: Secondary | ICD-10-CM

## 2023-09-17 MED ORDER — APIXABAN 5 MG PO TABS: 5.0000 mg | ORAL_TABLET | Freq: Two times a day (BID) | ORAL | 1 refills | Status: DC

## 2023-09-17 NOTE — Telephone Encounter (Signed)
*  STAT* If patient is at the pharmacy, call can be transferred to refill team.   1. Which medications need to be refilled? (please list name of each medication and dose if known) apixaban (ELIQUIS) 5 MG TABS tablet  2. Which pharmacy/location (including street and city if local pharmacy) is medication to be sent to? [id number] [place], [place] - [address]    3. Do they need a 30 day or 90 day supply? 30 day supply  Patient states he is completely out of medication.

## 2023-09-17 NOTE — Telephone Encounter (Signed)
Prescription refill request for Eliquis received. Indication: atrial fibrillation Last office visit: 01/2023 Scr: 1.6 Age:  52 Weight: 130.6 kg  Dose appropriate at 5 mg bid.  Rx sent to pharmacy

## 2023-09-19 ENCOUNTER — Encounter: Payer: Self-pay | Admitting: Student

## 2023-09-19 ENCOUNTER — Ambulatory Visit (INDEPENDENT_AMBULATORY_CARE_PROVIDER_SITE_OTHER): Payer: 59 | Admitting: Student

## 2023-09-19 VITALS — BP 155/104 | HR 79 | Temp 97.6°F | Ht 69.0 in | Wt 287.1 lb

## 2023-09-19 DIAGNOSIS — I129 Hypertensive chronic kidney disease with stage 1 through stage 4 chronic kidney disease, or unspecified chronic kidney disease: Secondary | ICD-10-CM

## 2023-09-19 DIAGNOSIS — I10 Essential (primary) hypertension: Secondary | ICD-10-CM

## 2023-09-19 DIAGNOSIS — E875 Hyperkalemia: Secondary | ICD-10-CM | POA: Diagnosis not present

## 2023-09-19 DIAGNOSIS — N1831 Chronic kidney disease, stage 3a: Secondary | ICD-10-CM | POA: Diagnosis not present

## 2023-09-19 DIAGNOSIS — E1122 Type 2 diabetes mellitus with diabetic chronic kidney disease: Secondary | ICD-10-CM

## 2023-09-19 DIAGNOSIS — E118 Type 2 diabetes mellitus with unspecified complications: Secondary | ICD-10-CM

## 2023-09-19 DIAGNOSIS — I480 Paroxysmal atrial fibrillation: Secondary | ICD-10-CM

## 2023-09-19 DIAGNOSIS — Z7985 Long-term (current) use of injectable non-insulin antidiabetic drugs: Secondary | ICD-10-CM

## 2023-09-19 DIAGNOSIS — Z7984 Long term (current) use of oral hypoglycemic drugs: Secondary | ICD-10-CM

## 2023-09-19 MED ORDER — CARVEDILOL 6.25 MG PO TABS: 6.2500 mg | ORAL_TABLET | Freq: Two times a day (BID) | ORAL | 11 refills | Status: DC

## 2023-09-19 MED ORDER — APIXABAN 5 MG PO TABS: 5.0000 mg | ORAL_TABLET | Freq: Two times a day (BID) | ORAL | 11 refills | Status: DC

## 2023-09-19 MED ORDER — MOUNJARO 5 MG/0.5ML ~~LOC~~ SOAJ: 5.0000 mg | SUBCUTANEOUS | 3 refills | Status: DC

## 2023-09-19 NOTE — Assessment & Plan Note (Signed)
Vitals:   09/19/23 1041 09/19/23 1119  BP: (!) 151/86 (!) 155/104   Uncontrolled.  Currently on amlodipine 10 mg daily, hydralazine 25 mg twice daily, olmesartan 20 mg daily (restarted on this therapy by [facility] Thedore [facility]).  However he has not been on beta-blocker.  Did take all of his medications this morning.  Given history of hyperkalemia, will check a BMP today.  Last checked in September of this year with a K of 5.2.  -Will add carvedilol 6.125 mg twice daily dosing for better blood pressure control -Continue current regimen as is otherwise -Follow-up BMP today -Encourage patient to check blood pressures at home and bring readings at next OV

## 2023-09-19 NOTE — Assessment & Plan Note (Addendum)
Post ablation.  CHA2DS2-VASc score of 4.  Continues on Eliquis 5 mg twice daily.  Last seen in cardiology on 01/2023 and per note patient should be on metoprolol 100 mg twice daily and  flecainide 100mg  twice daily, however he is not taking these medications.  -Refilled Eliquis today -Encourage patient to follow-up with cardiology

## 2023-09-19 NOTE — Progress Notes (Signed)
Subjective:  CC: Diabetes follow-up  HPI:  [name] is a 52 y.o. male with a past medical history stated below and presents today for diabetes follow-up. Please see problem based assessment and plan for additional details.  Past Medical History:  Diagnosis Date   Diabetes mellitus    GERD (gastroesophageal reflux disease)    History of alcohol abuse    History of cocaine use    History of echocardiogram    a. Echo 4/14: Moderate LVH, vigorous LVEF, EF 65-70%, normal wall motion, grade 2 diastolic dysfunction, mildly dilated aortic root and ascending aorta, ascending aorta 40 mm, aortic root 38 mm, mild LAE   History of pancreatitis 01/22/2019   Hx of cardiovascular stress test    a. GXT 5/14: No ischemic changes  //  b. ETT-Myoview 3/16:  Low risk, no ischemia, EF 58%   Hyperlipidemia    Hypertension    Morbid obesity (HCC)    Paroxysmal atrial fibrillation (HCC)    Occurring in 2008, with several recurrence since then (including in the setting of + cocaine on UDS).   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 20 tablet 0   amLODipine (NORVASC) 10 MG tablet TAKE 1 TABLET BY MOUTH EVERY DAY 30 tablet 0   amphetamine-dextroamphetamine (ADDERALL XR) 30 MG 24 hr capsule Take 1 capsule (30 mg total) by mouth daily as needed (focus). 1 daily as needed 30 capsule 0   atorvastatin (LIPITOR) 80 MG tablet TAKE 1 TABLET BY MOUTH EVERY DAY 90 tablet 3   Blood Glucose Monitoring Suppl DEVI 1 each by Does not apply route in the morning, at noon, and  at bedtime. May substitute to any manufacturer covered by patient's insurance. 1 each 0   cetirizine (ZYRTEC ALLERGY) 10 MG tablet Take 1 tablet (10 mg total) by mouth daily. 90 tablet 2   empagliflozin (JARDIANCE) 25 MG TABS tablet Take 1 tablet (25 mg total) by mouth daily. 90 tablet 2   flecainide (TAMBOCOR) 100 MG tablet TAKE 1 TABLET BY MOUTH TWICE A DAY 180 tablet 3   glucose blood (ACCU-CHEK GUIDE) test strip Please use strips to test morning fasting sugars and one hour after each meal, total 4x daily (Patient not taking: Reported on 07/31/2023) 200 strip 5   Glucose Blood (BLOOD GLUCOSE TEST STRIPS) STRP 1 each by In Vitro route in the morning, at noon, and at bedtime. May substitute to any manufacturer covered by patient's insurance. 100 each 3   hydrALAZINE (APRESOLINE) 25 MG tablet Take 1 tablet (25 mg total) by mouth in the morning and at bedtime. 60 tablet 3   metFORMIN (GLUCOPHAGE) 1000 MG tablet Take 1 tablet (1,000 mg total) by mouth 2 (two) times daily with a meal. 60 tablet 11   olmesartan (BENICAR) 20 MG tablet Take 20 mg by mouth daily.     pantoprazole (PROTONIX) 40 MG tablet TAKE 1 TABLET BY MOUTH EVERY DAY 30 tablet 0  No current facility-administered medications on file prior to visit.    Family History  Problem Relation Age of Onset   Diabetes Mother    Heart attack Father 44   Stroke Maternal Grandmother    Stroke Paternal Grandmother    Diabetes Paternal Grandfather     Social History   Socioeconomic History   Marital status: Single    Spouse name: Not on file   Number of children: 0   Years of education: 12   Highest education level: Not on file  Occupational History   Occupation: Unemployed    Employer: UNEMPLOYED  Tobacco Use   Smoking status: Former    Current packs/day: 0.00    Average packs/day: 0.5 packs/day for 28.0 years (14.0 ttl pk-yrs)    Types: Cigarettes    Start date: 10/15/1994    Quit date: 10/15/2022    Years since quitting: 0.9    Smokeless tobacco: Former    Types: Snuff   Tobacco comments:    1 pack per week [facility] 06/09/22  Vaping Use   Vaping status: Never Used  Substance and Sexual Activity   Alcohol use: Yes    Alcohol/week: 6.0 standard drinks of alcohol    Types: 6 Standard drinks or equivalent per week    Comment: rarely   Drug use: Not Currently    Comment: cocaine in the past, none currently   Sexual activity: Not on file  Other Topics Concern   Not on file  Social History Narrative   Fun: Photographer       Lives in [place] with sister.      Unemployed, was working a Office manager job   Social Determinants of Corporate investment banker Strain: Not on file  Food Insecurity: No Food Insecurity (02/02/2023)   Hunger Vital Sign    Worried About Running Out of Food in the Last Year: Never true    Ran Out of Food in the Last Year: Never true  Transportation Needs: Unmet Transportation Needs (02/02/2023)   PRAPARE - Administrator, Civil Service (Medical): Yes    Lack of Transportation (Non-Medical): No  Physical Activity: Inactive (12/11/2022)   Exercise Vital Sign    Days of Exercise per Week: 0 days    Minutes of Exercise per Session: 0 min  Stress: Not on file  Social Connections: Socially Isolated (09/06/2022)   Social Connection and Isolation Panel [NHANES]    Frequency of Communication with Friends and Family: More than three times a week    Frequency of Social Gatherings with Friends and Family: More than three times a week    Attends Religious Services: Never    Database administrator or Organizations: No    Attends Banker Meetings: Never    Marital Status: Never married  Intimate Partner Violence: Not At Risk (02/02/2023)   Humiliation, Afraid, Rape, and Kick questionnaire    Fear of Current or Ex-Partner: No    Emotionally Abused: No    Physically Abused: No    Sexually Abused: No    Review of Systems: ROS negative except for what is noted on the  assessment and plan.  Objective:   Vitals:   09/19/23 1041 09/19/23 1119  BP: (!) 151/86 (!) 155/104  Pulse: 80 79  Temp: 97.6 F (36.4 C)   TempSrc: Oral   SpO2: 98%   Weight: 287 lb 1.6 oz (130.2 kg)   Height: 5\' 9"  (1.753 m)     Physical  Exam: Constitutional: well-appearing man sitting in chair, in no acute distress HENT: normocephalic atraumatic, mucous membranes moist Eyes: conjunctiva non-erythematous Neck: supple Cardiovascular: regular rate and rhythm, no m/r/g Pulmonary/Chest: normal work of breathing on room air, lungs clear to auscultation bilaterally Abdominal: soft, non-tender, non-distended MSK: normal bulk and tone Neurological: alert & oriented x 3, ambulates without aids Skin: warm and dry Psych: Pleasant mood and affect    Assessment & Plan:   Paroxysmal atrial fibrillation (HCC) Post ablation.  CHA2DS2-VASc score of 4.  Continues on Eliquis 5 mg twice daily.  Last seen in cardiology on 01/2023 and per note patient should be on metoprolol 100 mg twice daily and  flecainide 100mg  twice daily, however he is not taking these medications.  -Refilled Eliquis today -Encourage patient to follow-up with cardiology  Benign essential hypertension Vitals:   09/19/23 1041 09/19/23 1119  BP: (!) 151/86 (!) 155/104   Uncontrolled.  Currently on amlodipine 10 mg daily, hydralazine 25 mg twice daily, olmesartan 20 mg daily (restarted on this therapy by Dr. Thedore Mins at [facility]).  However he has not been on beta-blocker.  Did take all of his medications this morning.  Given history of hyperkalemia, will check a BMP today.  Last checked in September of this year with a K of 5.2.  -Will add carvedilol 6.125 mg twice daily dosing for better blood pressure control -Continue current regimen as is otherwise -Follow-up BMP today -Encourage patient to check blood pressures at home and bring readings at next OV  Chronic kidney disease, stage 3 (HCC) Now established with [facility] last  seen in September 2024 by Dr. Anthony Sar.  At that time the urine protein to creatinine region was 650 mg.  A K of 5.2 alk phos of 4.8.  Creatinine of 1.6 and GFR of 52.  -Currently on ARB and SGLT2 inhibitor -Encouraged to make 65-month follow-up with Dr. Thedore Mins -Will follow his BMP today and sent to Dr. Thedore Mins if abnormal results for continuation of care  Type 2 diabetes mellitus with complications (HCC) Last A1c:  Lab Results  Component Value Date   HGBA1C 7.3 (A) 07/31/2023   Currently on metformin 1 g twice daily, Jardiance 25 mg daily and Mounjaro 2.5 mg weekly.  No GI side effects.  Reviewed medication dispense and patient has been on Mounjaro up to 5 mg weekly but because of a lapse in medication dispense he was started on 2.5 mg weekly and has been on it for the past 4 months without any problems.  Because of the uncontrolled A1c he will benefit from increasing this therapy. -Sent in prescription for Mounjaro 5 mg weekly -Continue other medications as before -A1c in 1 month   Return in about 4 weeks (around 10/17/2023) for Blood pressure follow up and medication titration.  Patient discussed with Dr. Gardiner Ramus, MD [facility]  09/19/2023, 12:03 PM

## 2023-09-19 NOTE — Assessment & Plan Note (Addendum)
Now established with [facility] last seen in September 2024 by Dr. Anthony Sar.  At that time the urine protein to creatinine region was 650 mg.  A K of 5.2 alk phos of 4.8.  Creatinine of 1.6 and GFR of 52.  -Currently on ARB and SGLT2 inhibitor -Encouraged to make 24-month follow-up with Dr. Thedore Mins -Will follow his BMP today and sent to Dr. Thedore Mins if abnormal results for continuation of care

## 2023-09-19 NOTE — Assessment & Plan Note (Signed)
Last A1c:  Lab Results  Component Value Date   HGBA1C 7.3 (A) 07/31/2023   Currently on metformin 1 g twice daily, Jardiance 25 mg daily and Mounjaro 2.5 mg weekly.  No GI side effects.  Reviewed medication dispense and patient has been on Mounjaro up to 5 mg weekly but because of a lapse in medication dispense he was started on 2.5 mg weekly and has been on it for the past 4 months without any problems.  Because of the uncontrolled A1c he will benefit from increasing this therapy. -Sent in prescription for Mounjaro 5 mg weekly -Continue other medications as before -A1c in 1 month

## 2023-09-19 NOTE — Patient Instructions (Addendum)
Thank you, [name] for allowing [place] to provide your care today. Today we discussed   Blood pressure BLOOD PRESSURE  Please check your blood pressure daily following the instructions below AND bring your readings to the next outpatient visit  Blood pressure tips   It is best to check your BP 1-2 hours after taking your medications to see the medications effectiveness on your BP.    Here are some tips that our clinical pharmacists share for home BP monitoring:          Rest 10 minutes before taking your blood pressure.          Don't smoke or drink caffeinated beverages for at least 30 minutes before.          Take your blood pressure before (not after) you eat.          Sit comfortably with your back supported and both feet on the floor (don't cross your legs).          Elevate your arm to heart level on a table or a desk.          Use the proper sized cuff. It should fit smoothly and snugly around your bare upper arm. There should be enough room to slip a fingertip under the cuff. The bottom edge of the cuff should be 1 inch above the crease of the elbow.     Take ALL your medications before you return to clinic   Referrals: -Please call the [facility] office to make follow-up appointment if you do not have 1 already  New medications: For your blood pressure START carvedilol 6.125 mg every 12 hours  For your diabetes Please pick up the new Mounjaro 5.0 mg pens -inject once weekly and stop the 2.5 pens  Please stay hydrated and eat smaller meals while you are on this medication. Monitor yourself for signs of: -Nausea, vomiting, bloating, diarrhea, or abdominal discomfort.  -Most of the effects should get better as you continue using this medication -If it does not improve in 2-3 days or you have poor oral intake, experience lightheadedness, or feel sick, please STOP the medication and call our office.   If you experience no side  effects/tolerate this therapy, we may be able to increase your dose at the next follow up visit.  I have ordered the following labs for you:  Lab Orders         BMP8+Anion Gap       I will call if any are abnormal and make medication changes   My Chart Access: [email or web address]  Please follow-up in: 4 weeks for blood pressure follow-up and possible medication changes    We look forward to seeing you next time. Please call our clinic at [phone] if you have any questions or concerns. The best time to call is Monday-Friday from 9am-4pm, but there is someone available 24/7. If after hours or the weekend, call the main hospital number and ask for the Internal Medicine Resident On-Call. If you need medication refills, please notify your pharmacy one week in advance and they will send [place] a request.   Thank you for letting us take part in your care. Wishing you the best!  Morene Crocker, MD 09/19/2023, 11:17 AM [facility]

## 2023-09-20 LAB — BMP8+ANION GAP
Anion Gap: 15 mmol/L (ref 10.0–18.0)
BUN/Creatinine Ratio: 16 (ref 9–20)
BUN: 20 mg/dL (ref 6–24)
CO2: 21 mmol/L (ref 20–29)
Calcium: 9.4 mg/dL (ref 8.7–10.2)
Chloride: 105 mmol/L (ref 96–106)
Creatinine, Ser: 1.28 mg/dL — ABNORMAL HIGH (ref 0.76–1.27)
Glucose: 167 mg/dL — ABNORMAL HIGH (ref 70–99)
Potassium: 4.6 mmol/L (ref 3.5–5.2)
Sodium: 141 mmol/L (ref 134–144)
eGFR: 67 mL/min/{1.73_m2} (ref >59)

## 2023-09-25 NOTE — Progress Notes (Signed)
Internal Medicine Clinic Attending  Case discussed with Dr. Gomez-Caraballo  At the time of the visit.  We reviewed the resident's history and exam and pertinent patient test results.  I agree with the assessment, diagnosis, and plan of care documented in the resident's note.  

## 2023-09-27 ENCOUNTER — Telehealth: Payer: Self-pay | Admitting: Internal Medicine

## 2023-09-27 NOTE — Telephone Encounter (Signed)
Calling to get a refill for his adderall  [id number] - [place], [place] - [address]

## 2023-09-28 MED ORDER — AMPHETAMINE-DEXTROAMPHET ER 30 MG PO CP24: 30.0000 mg | ORAL_CAPSULE | Freq: Every day | ORAL | 0 refills | Status: DC | PRN

## 2023-09-28 NOTE — Telephone Encounter (Signed)
Adderall refilled

## 2023-09-28 NOTE — Progress Notes (Signed)
Patient called and aware. Stable Cr and GFR stable. K 4.6. No changes at this time

## 2023-09-30 ENCOUNTER — Other Ambulatory Visit: Payer: Self-pay | Admitting: Student

## 2023-10-01 NOTE — Telephone Encounter (Signed)
Next appt scheduled 10/22/23 with Dr Welton Flakes.

## 2023-10-03 ENCOUNTER — Other Ambulatory Visit: Payer: Self-pay | Admitting: Student

## 2023-10-03 ENCOUNTER — Other Ambulatory Visit: Payer: Self-pay | Admitting: "Endocrinology

## 2023-10-03 DIAGNOSIS — E1165 Type 2 diabetes mellitus with hyperglycemia: Secondary | ICD-10-CM

## 2023-10-03 DIAGNOSIS — K219 Gastro-esophageal reflux disease without esophagitis: Secondary | ICD-10-CM

## 2023-10-22 ENCOUNTER — Encounter: Payer: 59 | Admitting: Internal Medicine

## 2023-10-26 ENCOUNTER — Telehealth: Payer: Self-pay | Admitting: Internal Medicine

## 2023-10-26 MED ORDER — AMPHETAMINE-DEXTROAMPHET ER 30 MG PO CP24: 30.0000 mg | ORAL_CAPSULE | Freq: Every day | ORAL | 0 refills | Status: DC | PRN

## 2023-10-26 NOTE — Telephone Encounter (Signed)
 NFN

## 2023-10-26 NOTE — Telephone Encounter (Signed)
 Please call in PT's Adderall.  Pharm is [facility] [address]   His CB # is [phone]

## 2023-10-26 NOTE — Telephone Encounter (Signed)
 Adderall refilled

## 2023-10-31 ENCOUNTER — Ambulatory Visit: Payer: 59 | Admitting: "Endocrinology

## 2023-11-03 ENCOUNTER — Other Ambulatory Visit: Payer: Self-pay | Admitting: "Endocrinology

## 2023-11-03 DIAGNOSIS — E1165 Type 2 diabetes mellitus with hyperglycemia: Secondary | ICD-10-CM

## 2023-11-04 ENCOUNTER — Other Ambulatory Visit: Payer: Self-pay | Admitting: Student

## 2023-11-04 DIAGNOSIS — E1165 Type 2 diabetes mellitus with hyperglycemia: Secondary | ICD-10-CM

## 2023-11-05 NOTE — Telephone Encounter (Signed)
Norvasc refill request complete

## 2023-11-06 ENCOUNTER — Other Ambulatory Visit: Payer: Self-pay

## 2023-11-06 DIAGNOSIS — E785 Hyperlipidemia, unspecified: Secondary | ICD-10-CM

## 2023-11-07 MED ORDER — ATORVASTATIN CALCIUM 80 MG PO TABS: 80.0000 mg | ORAL_TABLET | Freq: Every day | ORAL | 3 refills | Status: AC

## 2023-11-10 ENCOUNTER — Other Ambulatory Visit: Payer: Self-pay | Admitting: Student

## 2023-11-10 DIAGNOSIS — E1165 Type 2 diabetes mellitus with hyperglycemia: Secondary | ICD-10-CM

## 2023-11-12 ENCOUNTER — Ambulatory Visit (INDEPENDENT_AMBULATORY_CARE_PROVIDER_SITE_OTHER): Payer: 59 | Admitting: Podiatry

## 2023-11-12 ENCOUNTER — Ambulatory Visit: Payer: 59 | Admitting: Internal Medicine

## 2023-11-12 ENCOUNTER — Encounter: Payer: Self-pay | Admitting: Podiatry

## 2023-11-12 VITALS — BP 132/70 | HR 84 | Temp 98.0°F | Ht 69.0 in | Wt 292.7 lb

## 2023-11-12 DIAGNOSIS — I129 Hypertensive chronic kidney disease with stage 1 through stage 4 chronic kidney disease, or unspecified chronic kidney disease: Secondary | ICD-10-CM

## 2023-11-12 DIAGNOSIS — N1831 Chronic kidney disease, stage 3a: Secondary | ICD-10-CM | POA: Diagnosis not present

## 2023-11-12 DIAGNOSIS — M79609 Pain in unspecified limb: Secondary | ICD-10-CM

## 2023-11-12 DIAGNOSIS — B351 Tinea unguium: Secondary | ICD-10-CM | POA: Diagnosis not present

## 2023-11-12 DIAGNOSIS — Z23 Encounter for immunization: Secondary | ICD-10-CM

## 2023-11-12 DIAGNOSIS — E118 Type 2 diabetes mellitus with unspecified complications: Secondary | ICD-10-CM

## 2023-11-12 DIAGNOSIS — Z0000 Encounter for general adult medical examination without abnormal findings: Secondary | ICD-10-CM

## 2023-11-12 DIAGNOSIS — E1122 Type 2 diabetes mellitus with diabetic chronic kidney disease: Secondary | ICD-10-CM | POA: Diagnosis not present

## 2023-11-12 DIAGNOSIS — Z7985 Long-term (current) use of injectable non-insulin antidiabetic drugs: Secondary | ICD-10-CM

## 2023-11-12 DIAGNOSIS — I480 Paroxysmal atrial fibrillation: Secondary | ICD-10-CM

## 2023-11-12 DIAGNOSIS — I10 Essential (primary) hypertension: Secondary | ICD-10-CM

## 2023-11-12 DIAGNOSIS — Z7984 Long term (current) use of oral hypoglycemic drugs: Secondary | ICD-10-CM

## 2023-11-12 LAB — GLUCOSE, CAPILLARY: Glucose-Capillary: 178 mg/dL — ABNORMAL HIGH (ref 70–99)

## 2023-11-12 LAB — POCT GLYCOSYLATED HEMOGLOBIN (HGB A1C): Hemoglobin A1C: 7.6 % — AB (ref 4.0–5.6)

## 2023-11-12 MED ORDER — MOUNJARO 7.5 MG/0.5ML ~~LOC~~ SOAJ: 7.5000 mg | SUBCUTANEOUS | 3 refills | Status: DC

## 2023-11-12 NOTE — Progress Notes (Signed)
This patient returns to my office for at risk foot care.  This patient requires this care by a professional since this patient will be at risk due to having diabetes  coagulation defect and kidney disease.  Patient is prescribed eliquis.  This patient is unable t  o cut nails himself since the patient cannot reach his nails.These nails are painful walking and wearing shoes.  This patient has not been seen in over one year.This patient presents for at risk foot care today.  General Appearance  Alert, conversant and in no acute stress.  Vascular  Dorsalis pedis and posterior tibial  pulses are palpable  bilaterally.  Capillary return is within normal limits  bilaterally. Temperature is within normal limits  bilaterally.  Neurologic  Senn-Weinstein monofilament wire test within normal limits  bilaterally. Muscle power within normal limits bilaterally.  Nails Thick disfigured discolored nails with subungual debris  from hallux to fifth toes bilaterally. No evidence of bacterial infection or drainage bilaterally.  Orthopedic  No limitations of motion  feet .  No crepitus or effusions noted.  No bony pathology or digital deformities noted.  HAV  right foot.  Pes planus  B/L.  Skin  normotropic skin with no porokeratosis noted bilaterally.  No signs of infections or ulcers noted.     Onychomycosis  Pain in right toes  Pain in left toes  Consent was obtained for treatment procedures.   Mechanical debridement of nails 1-5  bilaterally performed with a nail nipper.  Filed with dremel without incident.    Return office visit    6 months                  Told patient to return for periodic foot care and evaluation due to potential at risk complications.   Helane Gunther DPM

## 2023-11-12 NOTE — Assessment & Plan Note (Signed)
Established with [facility] with Dr. Anthony Sar in September 2024. His recent K was 4.6, Cr of 1.28, and GFR of 67.  - Continue ARB and SGLT2 inhibitor  - Continue follow up with [facility] with Dr. Thedore Mins - Follow up urine microalbumin creatinine ratio

## 2023-11-12 NOTE — Assessment & Plan Note (Addendum)
Post-ablation with a CHA2DS2-VASc score of 4 currently on Eliquis 5 mg BID. Followed by cardiology with last appointment 01/2023 noting he should be on metoprolol 100 mg BID and flecainide 100 mg BID. He does not recognize these medications and is not taking them. On exam, he is in normal sinus rhythm, so unclear if flecainide would be beneficial at this time. Will reach out to cardiology to confirm flecainide is not needed and if not. - Continue Eliquis 5 mg BID - Cardiology follow up 1/30 with question of necessity of starting flecainide

## 2023-11-12 NOTE — Assessment & Plan Note (Addendum)
BP today 145/78, 132/70 on recheck. Currently prescribed amlodipine, hydralazine, olmesartan, and carvedilol was added last visit. He reports that he has been taking the carvedilol once per day, last dose yesterday AM, but had run out. Pharmacy records indicate he picked up his most recent prescription the day prior, indicating he is likely unclear which of his medications he is supposed to be taking when. He does not take his blood pressures at home and took his meds this morning aside from the carvedilol. BMP last visit showed a K of 4.6.  - Continue carvedilol 6.25 BID - Continue amlodipine 10 mg daily - Continue olmesartan 20 mg daily - Continue hydralazine 25 mg BID - Encouraged taking blood pressures at local pharmacy 2-3x weekly and bringing in log

## 2023-11-12 NOTE — Patient Instructions (Addendum)
Thank you, [name] for allowing [place] to provide your care today.   High blood pressure: Please continue taking these medications: Carvedilol aka Coreg 6.25 mg twice a day, amlodipine 10 mg every day, olmesartan 20 mg every day, and hydralazine 25 mg twice a day. If you can get to the pharmacy a few times a week to check your blood pressure, that would be great! Write down those numbers and bring them to your next appointment.   You are seeing your cardiologist, the [facility], on Thursday, Jan 30th. I hope we can decrease the number of medications you have to take.   Please continue taking the following diabetes medications as you have been: metformin 1000 mg twice per day and Jardiance 10 mg. For the [facility], please increase the dose to 7.5 mg every week.   Please make sure to schedule your annual eye exam!    I have ordered the following labs for you:  Lab Orders         Microalbumin / Creatinine Urine Ratio         Glucose, capillary         POCT glycosylated hemoglobin (Hb A1C)      I have ordered the following medication/changed the following medications:   Stop the following medications: Medications Discontinued During This Encounter  Medication Reason   tirzepatide Greggory Keen) 5 MG/0.5ML Pen      Start the following medications: Meds ordered this encounter  Medications   tirzepatide (MOUNJARO) 7.5 MG/0.5ML Pen    Sig: Inject 7.5 mg into the skin once a week.    Dispense:  2 mL    Refill:  3     Follow up: 3 months  We look forward to seeing you next time. Please call our clinic at [phone] if you have any questions or concerns. The best time to call is Monday-Friday from 9am-4pm, but there is someone available 24/7. If after hours or the weekend, call the main hospital number and ask for the Internal Medicine Resident On-Call. If you need medication refills, please notify your pharmacy one week in advance and they will send [place] a request.    Thank you for trusting me with your care. Wishing you the best!   Thea Alken, MS3 [facility]

## 2023-11-12 NOTE — Progress Notes (Addendum)
The care of the patient was discussed with Dr. Mayford Knife and the assessment and plan was formulated with their assistance.  Please see their note for official documentation of the patient encounter.   Subjective:   Patient ID: [name]. male   DOB: 1971-06-11 53 y.o.   MRN: [id number]  HPI: [name] is a 53 y.o. male presenting for BP check and hypertension management. He was started on carvedilol at last visit in December, which he may not be adherent to. Established with nephrology in 06/2023. He has follow up with cardiology on 1/30 and also endocrinology the same day. He has an appointment with pulmonology in March. Given several specialists, he would benefit from consolidation of his care and medications, with particular emphasis on cardiology at this time.   Past Medical History:  Diagnosis Date   Diabetes mellitus    GERD (gastroesophageal reflux disease)    History of alcohol abuse    History of cocaine use    History of echocardiogram    a. Echo 4/14: Moderate LVH, vigorous LVEF, EF 65-70%, normal wall motion, grade 2 diastolic dysfunction, mildly dilated aortic root and ascending aorta, ascending aorta 40 mm, aortic root 38 mm, mild LAE   History of pancreatitis 01/22/2019   Hx of cardiovascular stress test    a. GXT 5/14: No ischemic changes  //  b. ETT-Myoview 3/16:  Low risk, no ischemia, EF 58%   Hyperlipidemia    Hypertension    Morbid obesity (HCC)    Paroxysmal atrial fibrillation (HCC)    Occurring in 2008, with several recurrence since then (including in the setting of + cocaine on UDS).   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 2 mL 3   Accu-Chek Softclix Lancets lancets TEST UP TO 4 TIMES A DAY  (Patient not taking: Reported on 07/31/2023) 100 each 2   acetaminophen (TYLENOL) 325 MG tablet Take 2 tablets (650 mg total) by mouth every 6 (six) hours as needed for mild pain (or Fever >/= 101). 20 tablet 0   amLODipine (NORVASC) 10 MG tablet TAKE 1 TABLET BY MOUTH EVERY DAY 30 tablet 11   amphetamine-dextroamphetamine (ADDERALL XR) 30 MG 24 hr capsule Take 1 capsule (30 mg total) by mouth daily as needed (focus). 1 daily as needed 30 capsule 0   apixaban (ELIQUIS) 5 MG TABS tablet Take 1 tablet (5 mg total) by mouth 2 (two) times daily. 60 tablet 11   atorvastatin (LIPITOR) 80 MG tablet Take 1 tablet (80 mg total) by mouth daily. 90 tablet 3   Blood Glucose Monitoring Suppl DEVI 1 each by Does not apply route in the morning, at noon, and at bedtime. May substitute to any manufacturer covered by patient's insurance. 1 each 0   carvedilol (COREG) 6.25 MG tablet Take 1 tablet (6.25 mg total) by mouth 2 (two) times daily with a meal. 60 tablet 11   cetirizine (ZYRTEC ALLERGY) 10 MG tablet Take 1 tablet (10 mg total) by mouth daily. 90 tablet 2   empagliflozin (JARDIANCE) 25 MG TABS tablet Take 1 tablet (25 mg total) by mouth daily. 90 tablet 2   flecainide (TAMBOCOR) 100 MG tablet TAKE 1  TABLET BY MOUTH TWICE A DAY 180 tablet 3   glucose blood (ACCU-CHEK GUIDE) test strip Please use strips to test morning fasting sugars and one hour after each meal, total 4x daily (Patient not taking: Reported on 07/31/2023) 200 strip 5   Glucose Blood (BLOOD GLUCOSE TEST STRIPS) STRP 1 each by In Vitro route in the morning, at noon, and at bedtime. May substitute to any manufacturer covered by patient's insurance. 100 each 3   hydrALAZINE (APRESOLINE) 25 MG tablet TAKE 1 TABLET (25 MG) BY MOUTH IN THE MORNING AND AT BEDTIME 60 tablet 3   metFORMIN (GLUCOPHAGE) 1000 MG tablet Take 1 tablet (1,000 mg total) by mouth 2 (two) times daily with a meal. 60 tablet 11   olmesartan (BENICAR) 20 MG tablet TAKE 1 TABLET BY  MOUTH EVERY DAY 30 tablet 1   pantoprazole (PROTONIX) 40 MG tablet TAKE 1 TABLET BY MOUTH EVERY DAY 30 tablet 0   No current facility-administered medications for this visit.   Family History  Problem Relation Age of Onset   Diabetes Mother    Heart attack Father 59   Stroke Maternal Grandmother    Stroke Paternal Grandmother    Diabetes Paternal Grandfather    Social History   Socioeconomic History   Marital status: Single    Spouse name: Not on file   Number of children: 0   Years of education: 12   Highest education level: Not on file  Occupational History   Occupation: Unemployed    Employer: UNEMPLOYED  Tobacco Use   Smoking status: Former    Current packs/day: 0.00    Average packs/day: 0.5 packs/day for 28.0 years (14.0 ttl pk-yrs)    Types: Cigarettes    Start date: 10/15/1994    Quit date: 10/15/2022    Years since quitting: 1.0   Smokeless tobacco: Former    Types: Snuff   Tobacco comments:    1 pack per week HEI 06/09/22  Vaping Use   Vaping status: Never Used  Substance and Sexual Activity   Alcohol use: Yes    Alcohol/week: 6.0 standard drinks of alcohol    Types: 6 Standard drinks or equivalent per week    Comment: rarely   Drug use: Not Currently    Comment: cocaine in the past, none currently   Sexual activity: Not on file  Other Topics Concern   Not on file  Social History Narrative   Fun: Photographer       Lives in [place] with sister.      Unemployed, was working a Office manager job   Social Drivers of Corporate investment banker Strain: Not on file  Food Insecurity: No Food Insecurity (02/02/2023)   Hunger Vital Sign    Worried About Running Out of Food in the Last Year: Never true    Ran Out of Food in the Last Year: Never true  Transportation Needs: Unmet Transportation Needs (02/02/2023)   PRAPARE - Administrator, Civil Service (Medical): Yes    Lack of Transportation (Non-Medical): No  Physical Activity: Inactive  (12/11/2022)   Exercise Vital Sign    Days of Exercise per Week: 0 days    Minutes of Exercise per Session: 0 min  Stress: Not on file  Social Connections: Socially Isolated (09/06/2022)   Social Connection and Isolation Panel [NHANES]    Frequency of Communication with Friends and Family: More than three times a week    Frequency of Social Gatherings with Friends  and Family: More than three times a week    Attends Religious Services: Never    Active Member of Clubs or Organizations: No    Attends Banker Meetings: Never    Marital Status: Never married   Review of Systems: Pertinent items noted in HPI and remainder of comprehensive ROS otherwise negative.  Objective:  Physical Exam: Vitals:   11/12/23 0951 11/12/23 1011  BP: (!) 145/78 132/70  Pulse: (!) 50 84  Temp: 98 F (36.7 C)   TempSrc: Oral   SpO2: 98%   Weight: 292 lb 11.2 oz (132.8 kg)   Height: 5\' 9"  (1.753 m)    BP 132/70 (BP Location: Left Arm, Patient Position: Sitting, Cuff Size: Large)   Pulse 84   Temp 98 F (36.7 C) (Oral)   Ht 5\' 9"  (1.753 m)   Wt 292 lb 11.2 oz (132.8 kg)   SpO2 98%   BMI 43.22 kg/m   Physical Exam: Constitutional: well-appearing, sitting comfortably in chair, in no acute distress HENT: normocephalic atraumatic, mucous membranes moist Eyes: conjunctiva non-erythematous Cardiovascular: regular rate and rhythm, no m/r/g Pulmonary/Chest: normal work of breathing on room air, lungs clear to auscultation bilaterally MSK: normal bulk and tone Neurological: alert & oriented x 3, normal gait, no focal deficits Skin: warm and dry Psych: Mood and affect appropriate     Assessment & Plan:  Benign essential hypertension BP today 145/78, 132/70 on recheck. Currently prescribed amlodipine, hydralazine, olmesartan, and carvedilol was added last visit. He reports that he has been taking the carvedilol once per day, last dose yesterday AM, but had run out. Pharmacy records indicate  he picked up his most recent prescription the day prior, indicating he is likely unclear which of his medications he is supposed to be taking when. He does not take his blood pressures at home and took his meds this morning aside from the carvedilol. BMP last visit showed a K of 4.6.  - Continue carvedilol 6.25 BID - Continue amlodipine 10 mg daily - Continue olmesartan 20 mg daily - Continue hydralazine 25 mg BID - Encouraged taking blood pressures at local pharmacy 2-3x weekly and bringing in log  Paroxysmal atrial fibrillation (HCC) Post-ablation with a CHA2DS2-VASc score of 4 currently on Eliquis 5 mg BID. Followed by cardiology with last appointment 01/2023 noting he should be on metoprolol 100 mg BID and flecainide 100 mg BID. He does not recognize these medications and is not taking them. On exam, he is in normal sinus rhythm, so unclear if flecainide would be beneficial at this time. Will reach out to cardiology to confirm flecainide is not needed and if not. - Continue Eliquis 5 mg BID - Cardiology follow up 1/30 with question of necessity of starting flecainide  Type 2 diabetes mellitus with complications (HCC) Last A1c at 7.3%, today 7.6%. He is currently prescribed metformin, Jardiance, and Mounjaro. He does not report side effects with any of these medications. His last foot exam was 11/2022, collecting UAR today, and reinforced scheduling his annual eye appointment. He is followed by Dr. Carin Hock with endocrinology, but unclear why he was referred there initially. Appears this referral was placed by pulmonology with OSA and nacrolepsy. Discussed following his diabetes here rather than endo in order to consolidate his healthcare.  - Increase Mounjaro from 5 to 7.5 mg weekly - Continue metformin 1000 mg BID - Continue Jardiance 10 mg daily - Consider following here rather than endocrinology - Follow up urine microalbumin creatinine ratio -  Follow up diabetic eye exam  Chronic kidney  disease, stage 3 (HCC) Established with [facility] with Dr. Anthony Sar in September 2024. His recent K was 4.6, Cr of 1.28, and GFR of 67.  - Continue ARB and SGLT2 inhibitor  - Continue follow up with [facility] with Dr. Thedore Mins - Follow up urine microalbumin creatinine ratio  Healthcare maintenance Received flu vaccine today. Was counseled about the Shingles vaccine and agreed to get this at his pharmacy.    Thea Alken, MS3

## 2023-11-12 NOTE — Assessment & Plan Note (Addendum)
Last A1c at 7.3%, today 7.6%. He is currently prescribed metformin, Jardiance, and Mounjaro. He does not report side effects with any of these medications. His last foot exam was 11/2022, collecting UAR today, and reinforced scheduling his annual eye appointment. He is followed by Dr. Carin Hock with endocrinology, but unclear why he was referred there initially. Appears this referral was placed by pulmonology with OSA and nacrolepsy. Discussed following his diabetes here rather than endo in order to consolidate his healthcare.  - Increase Mounjaro from 5 to 7.5 mg weekly - Continue metformin 1000 mg BID - Continue Jardiance 10 mg daily - Consider following here rather than endocrinology - Follow up urine microalbumin creatinine ratio - Follow up diabetic eye exam  Addendum: Urine microalbumin elevated at 243.  He is prescribed SGLT2 and ARB but unclear about his adherence.  I called and reviewed result with him.  I talked with him about importance of medication adherence.  Plan to repeat urine microalbumin at follow-up in 3 months

## 2023-11-12 NOTE — Progress Notes (Deleted)
Subjective:  CC: Blood pressure follow-up  HPI:  [name] is a 53 y.o. 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  Seen in December and blood pressure was uncontrolled at that time.  Beta-blocker was added to his regimen.  Established with nephrology in September 2024.  Needs to follow-up with cardiology.  He has follow-up on January 30.  Appointment with endocrinology on January 30.  He will see pulmonology in March.  Please see problem based assessment and plan for additional details.  Past Medical History:  Diagnosis Date   Diabetes mellitus    GERD (gastroesophageal reflux disease)    History of alcohol abuse    History of cocaine use    History of echocardiogram    a. Echo 4/14: Moderate LVH, vigorous LVEF, EF 65-70%, normal wall motion, grade 2 diastolic dysfunction, mildly dilated aortic root and ascending aorta, ascending aorta 40 mm, aortic root 38 mm, mild LAE   History of pancreatitis 01/22/2019   Hx of cardiovascular stress test    a. GXT 5/14: No ischemic changes  //  b. ETT-Myoview 3/16:  Low risk, no ischemia, EF 58%   Hyperlipidemia    Hypertension    Morbid obesity (HCC)    Paroxysmal atrial fibrillation (HCC)    Occurring in 2008, with several recurrence since then (including in the setting of + cocaine on UDS).   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 20 tablet 0   amLODipine (NORVASC) 10 MG tablet TAKE 1 TABLET BY MOUTH EVERY DAY 30 tablet 0   amphetamine-dextroamphetamine (ADDERALL XR) 30 MG 24 hr capsule Take 1 capsule (30 mg total) by mouth daily as needed (focus). 1 daily as needed 30 capsule 0   apixaban (ELIQUIS) 5 MG TABS tablet Take 1 tablet (5 mg total) by mouth 2 (two) times daily. 60 tablet 11   atorvastatin (LIPITOR) 80 MG tablet Take 1 tablet (80 mg total) by mouth daily. 90 tablet 3   Blood Glucose Monitoring Suppl DEVI 1 each by Does not apply route in the morning, at noon, and at bedtime. May substitute to any manufacturer covered by patient's insurance. 1 each 0   carvedilol (COREG) 6.25 MG tablet Take 1 tablet (6.25 mg total) by mouth 2 (two) times daily with a meal. 60 tablet 11   cetirizine (ZYRTEC ALLERGY) 10 MG tablet Take 1 tablet (10 mg total) by mouth daily. 90 tablet 2   empagliflozin (JARDIANCE) 25 MG TABS tablet Take 1 tablet (25 mg total) by mouth daily. 90 tablet 2   flecainide (TAMBOCOR) 100 MG tablet TAKE 1 TABLET BY MOUTH TWICE A DAY 180 tablet 3   glucose blood (ACCU-CHEK GUIDE) test strip Please use strips to test morning fasting sugars and one hour after each meal, total 4x daily (  Patient not taking: Reported on 07/31/2023) 200 strip 5   Glucose Blood (BLOOD GLUCOSE TEST STRIPS) STRP 1 each by In Vitro route in the morning, at noon, and at bedtime. May substitute to any manufacturer covered by patient's insurance. 100 each 3   hydrALAZINE (APRESOLINE) 25 MG tablet TAKE 1 TABLET (25 MG) BY MOUTH IN THE MORNING AND AT BEDTIME 60 tablet 3   metFORMIN (GLUCOPHAGE) 1000 MG tablet Take 1 tablet (1,000 mg total) by mouth 2 (two) times daily with a meal. 60 tablet 11   olmesartan (BENICAR) 20 MG tablet TAKE 1 TABLET BY MOUTH EVERY DAY 30 tablet 1   pantoprazole (PROTONIX) 40 MG tablet TAKE 1 TABLET BY MOUTH EVERY DAY 30 tablet 0   tirzepatide (MOUNJARO) 5 MG/0.5ML Pen Inject 5  mg into the skin once a week. 2 mL 3   No current facility-administered medications on file prior to visit.    Family History  Problem Relation Age of Onset   Diabetes Mother    Heart attack Father 11   Stroke Maternal Grandmother    Stroke Paternal Grandmother    Diabetes Paternal Grandfather     Social History   Socioeconomic History   Marital status: Single    Spouse name: Not on file   Number of children: 0   Years of education: 12   Highest education level: Not on file  Occupational History   Occupation: Unemployed    Employer: UNEMPLOYED  Tobacco Use   Smoking status: Former    Current packs/day: 0.00    Average packs/day: 0.5 packs/day for 28.0 years (14.0 ttl pk-yrs)    Types: Cigarettes    Start date: 10/15/1994    Quit date: 10/15/2022    Years since quitting: 1.0   Smokeless tobacco: Former    Types: Snuff   Tobacco comments:    1 pack per week HEI 06/09/22  Vaping Use   Vaping status: Never Used  Substance and Sexual Activity   Alcohol use: Yes    Alcohol/week: 6.0 standard drinks of alcohol    Types: 6 Standard drinks or equivalent per week    Comment: rarely   Drug use: Not Currently    Comment: cocaine in the past, none currently   Sexual activity: Not on file  Other Topics Concern   Not on file  Social History Narrative   Fun: Photographer       Lives in [place] with sister.      Unemployed, was working a Office manager job   Social Drivers of Corporate investment banker Strain: Not on file  Food Insecurity: No Food Insecurity (02/02/2023)   Hunger Vital Sign    Worried About Running Out of Food in the Last Year: Never true    Ran Out of Food in the Last Year: Never true  Transportation Needs: Unmet Transportation Needs (02/02/2023)   PRAPARE - Administrator, Civil Service (Medical): Yes    Lack of Transportation (Non-Medical): No  Physical Activity: Inactive (12/11/2022)   Exercise Vital Sign    Days of Exercise per Week: 0  days    Minutes of Exercise per Session: 0 min  Stress: Not on file  Social Connections: Socially Isolated (09/06/2022)   Social Connection and Isolation Panel [NHANES]    Frequency of Communication with Friends and Family: More than three times a week    Frequency of Social Gatherings with Friends and Family: More than three times a week  Attends Religious Services: Never    Active Member of Clubs or Organizations: No    Attends Banker Meetings: Never    Marital Status: Never married  Intimate Partner Violence: Not At Risk (02/02/2023)   Humiliation, Afraid, Rape, and Kick questionnaire    Fear of Current or Ex-Partner: No    Emotionally Abused: No    Physically Abused: No    Sexually Abused: No    Review of Systems: ROS negative except for what is noted on the assessment and plan.  Objective:  There were no vitals filed for this visit.  Physical Exam: Constitutional: well-appearing *** sitting in ***, in no acute distress HENT: normocephalic atraumatic, mucous membranes moist Eyes: conjunctiva non-erythematous Neck: supple Cardiovascular: regular rate and rhythm, no m/r/g Pulmonary/Chest: normal work of breathing on room air, lungs clear to auscultation bilaterally Abdominal: soft, non-tender, non-distended MSK: normal bulk and tone Neurological: alert & oriented x 3, 5/5 strength in bilateral upper and lower extremities, normal gait Skin: warm and dry Psych: ***     Assessment & Plan:  No problem-specific Assessment & Plan notes found for this encounter.   Hypertension Home medications include hydralazine 25 mg, amlodipine 10 mg, olmesartan 20 mg, carvedilol 6.25 mg twice daily Blood pressure at 145/78.  Improved to 132/70.  Diabetes He is currently taking Mounjaro 5 mg weekly Jardiance 25 mg and metformin 1000 mg twice daily  Patient {[id number]::"discussed with","seen with"} Dr. {[id number]::"[id number]","G.  Hoffman","Mullen","Narendra","Machen","Vincent","Guilloud","Lau"}   [employer], D.O. [facility]  PGY-3 Pager: [phone]  Phone: [phone] Date 11/12/2023  Time 7:46 AM

## 2023-11-12 NOTE — Assessment & Plan Note (Signed)
Received flu vaccine today. Was counseled about the Shingles vaccine and agreed to get this at his pharmacy.

## 2023-11-13 ENCOUNTER — Encounter: Payer: 59 | Admitting: Internal Medicine

## 2023-11-13 LAB — MICROALBUMIN / CREATININE URINE RATIO
Creatinine, Urine: 89.3 mg/dL
Microalb/Creat Ratio: 243 mg/g{creat} — ABNORMAL HIGH (ref 0–29)
Microalbumin, Urine: 216.8 ug/mL

## 2023-11-15 ENCOUNTER — Ambulatory Visit: Payer: 59 | Admitting: Nurse Practitioner

## 2023-11-15 ENCOUNTER — Ambulatory Visit: Payer: 59 | Admitting: "Endocrinology

## 2023-11-19 NOTE — Progress Notes (Signed)
Internal Medicine Clinic Attending  Case discussed with the student and supervising resident at the time of the visit.  We reviewed the supervised student's history and exam and pertinent patient test results.  I agree with the assessment, diagnosis, and plan of care documented in the note.

## 2023-11-26 ENCOUNTER — Telehealth: Payer: Self-pay | Admitting: Internal Medicine

## 2023-11-26 NOTE — Telephone Encounter (Signed)
 Calling to get a refill for his adderall  [id number] - [place], [place] - [address]

## 2023-11-26 NOTE — Telephone Encounter (Signed)
PT ret our call. Please try again. 

## 2023-11-26 NOTE — Telephone Encounter (Signed)
 ATC X1. Lmtcb

## 2023-11-27 MED ORDER — AMPHETAMINE-DEXTROAMPHET ER 30 MG PO CP24: 30.0000 mg | ORAL_CAPSULE | Freq: Every day | ORAL | 0 refills | Status: DC | PRN

## 2023-11-27 NOTE — Telephone Encounter (Signed)
Called and spoke to patient. He is requesting an adderall refill. Last refilled 10/26/2023 #30. [facility] [address]   Dr. Maple Hudson, please advise. Thanks

## 2023-11-27 NOTE — Telephone Encounter (Signed)
Pt is aware of below message and voiced his understanding. Nothing further needed.

## 2023-11-27 NOTE — Telephone Encounter (Signed)
Adderall refilled

## 2023-11-27 NOTE — Telephone Encounter (Signed)
Patient checking on message for Adderall refill. Patient out of medication. Patient phone number is [phone].

## 2023-12-07 ENCOUNTER — Other Ambulatory Visit: Payer: Self-pay | Admitting: Student

## 2023-12-07 DIAGNOSIS — K219 Gastro-esophageal reflux disease without esophagitis: Secondary | ICD-10-CM

## 2023-12-12 NOTE — Progress Notes (Signed)
 Cardiology Office Note:   Date:  12/17/2023  ID:  Douglas Band., DOB 1970-11-29, MRN [id number] PCP:  Rocky Morel, DO  [facility] Providers Cardiologist:  Alverda Skeans, MD Referring MD: Rocky Morel, DO  Chief Complaint/Reason for Referral: Follow-up for atrial fibrillation, hypertension ASSESSMENT:    1. Paroxysmal atrial fibrillation (HCC)   2. Secondary hypercoagulable state (HCC)   3. Type 2 diabetes mellitus with complication, without long-term current use of insulin (HCC)   4. Hypertension associated with diabetes (HCC)   5. Hyperlipidemia associated with type 2 diabetes mellitus (HCC)   6. Aortic atherosclerosis (HCC)   7. CKD stage 2 due to type 2 diabetes mellitus (HCC)   8. History of CVA (cerebrovascular accident)   9. Chronic diastolic heart failure (HCC)   10. BMI 40.0-44.9, adult (HCC)     PLAN:   In order of problems listed above: Paroxysmal atrial fibrillation: Follows with EP.  Patient is in normal sinus rhythm today.  He is off flecainide which I think is okay.  Continue Coreg 6.25 twice daily and Eliquis 5 mg twice daily.  Discontinue aspirin 81 mg. Secondary hypercoagulable state: Continue Eliquis 5 mg twice daily. Type 2 diabetes mellitus: Continue Eliquis 5 mg twice daily, atorvastatin 80 mg daily, Jardiance 25 mg daily, and Benicar 20 mg daily.  Continue Mounjaro 7.5 mg q. weekly. Hypertension: BP well controlled today.  Continue amlodipine 10 mg, Coreg 6.25 twice daily, and Benicar 20 mg daily Hyperlipidemia: Check lipid panel, LFTs, and LP(a).  LDL in July 2024 was at goal of less than 55. CKD stage II: Continue Jardiance 25 mg daily and Benicar 20 mg daily for renal protection. History of stroke: TEE negative for intracardiac shunt.  Was in the context of UDS being positive for cocaine.  Continue Xarelto 5 mg twice daily.  Control cardiovascular risk factors.  Discontinue aspirin 81 mg. Chronic diastolic heart failure: Continue  Jardiance 25 mg daily, Benicar 20 mg daily, Coreg 6.25 mg twice daily. Elevated BMI: Continue Mounjaro 7.5 mg weekly to reduce patient's risk of myocardial infarction given history of type 2 diabetes.            Dispo:  Return in about 6 months (around 06/18/2024).      Medication Adjustments/Labs and Tests Ordered: Current medicines are reviewed at length with the patient today.  Concerns regarding medicines are outlined above.  The following changes have been made:     Labs/tests ordered: Orders Placed This Encounter  Procedures   Lipid panel   Lipoprotein A (LPA)   Hepatic function panel    Medication Changes: No orders of the defined types were placed in this encounter.   Current medicines are reviewed at length with the patient today.  The patient does not have concerns regarding medicines.  I spent 33 minutes reviewing all clinical data during and prior to this visit including all relevant imaging studies, laboratories, clinical information from other health systems and prior notes from both Cardiology and other specialties, interviewing the patient, conducting a complete physical examination, and coordinating care in order to formulate a comprehensive and personalized evaluation and treatment plan.   History of Present Illness:      FOCUSED PROBLEM LIST:   PAF CV 2 score of 4 On Eliquis and flecainide >> followed by EP Status post PVI and SVT ablation 2021 T2DM Not on insulin Hypertension Hyperlipidemia Aortic atherosclerosis CT abdomen pelvis 2020 CKD stage II History of stroke 2022 TEE negative bubble study 2022  Thought to be Xarelto failure >> converted to Eliquis UDS positive for cocaine BMI 43 Diastolic dysfunction Severe LVH, no significant valve issues, EF 60 to 65% TEE 2022 OSA On CPAP  3/25:  Patient consents to use of AI scribe.  The patient returns for routine follow-up.  He was last seen in April 2024.  At that point in time his blood  pressure is well-controlled.  He had no issues with Eliquis.  No changes were made to his medical regimen.  He has a history of atrial fibrillation and underwent ablation in 2021. Currently, he is not experiencing any palpitations, fast heartbeats, or racing sensations. He was previously on flecainide but is no longer taking it. He is currently on carvedilol, which replaces metoprolol. He is on Eliquis for anticoagulation and has not experienced any significant bleeding. He was also taking aspirin, but it was noted that he does not need to be on both Eliquis and aspirin.  He has not been taking flecainide for some time.  He is unsure why this is.  He has diabetes but is not on insulin therapy. He is on [facility], which is administered as a shot. He is on blood pressure medication and cholesterol pills. No issues with his blood pressure, which is well-controlled.  He uses CPAP for sleep apnea and reports no issues with breathing while lying flat. He has a history of a stroke in 2022 and acknowledges that his kidneys are 'a tad bit weak.'  He has not had any recent hospital or emergency department visits and reports no swelling in his legs          Current Medications: Current Meds  Medication Sig   Accu-Chek Softclix Lancets lancets TEST UP TO 4 TIMES A DAY   acetaminophen (TYLENOL) 325 MG tablet Take 2 tablets (650 mg total) by mouth every 6 (six) hours as needed for mild pain (or Fever >/= 101).   amLODipine (NORVASC) 10 MG tablet TAKE 1 TABLET BY MOUTH EVERY DAY   amphetamine-dextroamphetamine (ADDERALL XR) 30 MG 24 hr capsule Take 1 capsule (30 mg total) by mouth daily as needed (focus). 1 daily as needed   apixaban (ELIQUIS) 5 MG TABS tablet Take 1 tablet (5 mg total) by mouth 2 (two) times daily.   atorvastatin (LIPITOR) 80 MG tablet Take 1 tablet (80 mg total) by mouth daily.   Blood Glucose Monitoring Suppl DEVI 1 each by Does not apply route in the morning, at noon, and at bedtime. May  substitute to any manufacturer covered by patient's insurance.   carvedilol (COREG) 6.25 MG tablet Take 1 tablet (6.25 mg total) by mouth 2 (two) times daily with a meal.   empagliflozin (JARDIANCE) 25 MG TABS tablet Take 1 tablet (25 mg total) by mouth daily.   glucose blood (ACCU-CHEK GUIDE) test strip Please use strips to test morning fasting sugars and one hour after each meal, total 4x daily   hydrALAZINE (APRESOLINE) 25 MG tablet TAKE 1 TABLET (25 MG) BY MOUTH IN THE MORNING AND AT BEDTIME   metFORMIN (GLUCOPHAGE) 1000 MG tablet Take 1 tablet (1,000 mg total) by mouth 2 (two) times daily with a meal.   olmesartan (BENICAR) 20 MG tablet TAKE 1 TABLET BY MOUTH EVERY DAY   pantoprazole (PROTONIX) 40 MG tablet TAKE 1 TABLET BY MOUTH EVERY DAY   tirzepatide (MOUNJARO) 7.5 MG/0.5ML Pen Inject 7.5 mg into the skin once a week.   [DISCONTINUED] aspirin 81 MG chewable tablet Chew 81 mg by mouth  daily.     Review of Systems:   Please see the history of present illness.    All other systems reviewed and are negative.     EKGs/Labs/Other Test Reviewed:   EKG: EKG from April 2024 demonstrates sinus rhythm with left anterior fascicular block  EKG Interpretation Date/Time:    Ventricular Rate:    PR Interval:    QRS Duration:    QT Interval:    QTC Calculation:   R Axis:      Text Interpretation:           Risk Assessment/Calculations:    CHA2DS2-VASc Score = 4   This indicates a 4.8% annual risk of stroke. The patient's score is based upon: CHF History: 0 HTN History: 1 Diabetes History: 1 Stroke History: 2 Vascular Disease History: 0 Age Score: 0 Gender Score: 0         Physical Exam:   VS:  BP 112/66   Pulse 75   Ht 5\' 9"  (1.753 m)   Wt 283 lb 6.4 oz (128.5 kg)   SpO2 96%   BMI 41.85 kg/m        Wt Readings from Last 3 Encounters:  12/17/23 283 lb 6.4 oz (128.5 kg)  11/12/23 292 lb 11.2 oz (132.8 kg)  09/19/23 287 lb 1.6 oz (130.2 kg)      GENERAL:  No  apparent distress, AOx3 HEENT:  No carotid bruits, +2 carotid impulses, no scleral icterus CAR: RRR no murmurs, gallops, rubs, or thrills RES:  Clear to auscultation bilaterally ABD:  Soft, nontender, nondistended, positive bowel sounds x 4 VASC:  +2 radial pulses, +2 carotid pulses NEURO:  CN 2-12 grossly intact; motor and sensory grossly intact PSYCH:  No active depression or anxiety EXT:  No edema, ecchymosis, or cyanosis  Signed, Orbie Pyo, MD  12/17/2023 8:27 AM    [facility] [address] [place], [place], [place]  [address] Phone: [phone]; Fax: [phone]   Note:  This document was prepared using Dragon voice recognition software and may include unintentional dictation errors.

## 2023-12-17 ENCOUNTER — Encounter: Payer: Self-pay | Admitting: Internal Medicine

## 2023-12-17 ENCOUNTER — Ambulatory Visit: Payer: 59 | Admitting: "Endocrinology

## 2023-12-17 ENCOUNTER — Ambulatory Visit: Payer: 59 | Attending: Internal Medicine | Admitting: Internal Medicine

## 2023-12-17 VITALS — BP 112/66 | HR 75 | Ht 69.0 in | Wt 283.4 lb

## 2023-12-17 DIAGNOSIS — E1122 Type 2 diabetes mellitus with diabetic chronic kidney disease: Secondary | ICD-10-CM

## 2023-12-17 DIAGNOSIS — I152 Hypertension secondary to endocrine disorders: Secondary | ICD-10-CM

## 2023-12-17 DIAGNOSIS — I700 Atherosclerosis of aorta: Secondary | ICD-10-CM

## 2023-12-17 DIAGNOSIS — N182 Chronic kidney disease, stage 2 (mild): Secondary | ICD-10-CM

## 2023-12-17 DIAGNOSIS — E118 Type 2 diabetes mellitus with unspecified complications: Secondary | ICD-10-CM | POA: Diagnosis not present

## 2023-12-17 DIAGNOSIS — Z8673 Personal history of transient ischemic attack (TIA), and cerebral infarction without residual deficits: Secondary | ICD-10-CM

## 2023-12-17 DIAGNOSIS — I480 Paroxysmal atrial fibrillation: Secondary | ICD-10-CM

## 2023-12-17 DIAGNOSIS — E1159 Type 2 diabetes mellitus with other circulatory complications: Secondary | ICD-10-CM

## 2023-12-17 DIAGNOSIS — E785 Hyperlipidemia, unspecified: Secondary | ICD-10-CM

## 2023-12-17 DIAGNOSIS — Z6841 Body Mass Index (BMI) 40.0 and over, adult: Secondary | ICD-10-CM

## 2023-12-17 DIAGNOSIS — I5032 Chronic diastolic (congestive) heart failure: Secondary | ICD-10-CM

## 2023-12-17 DIAGNOSIS — E1169 Type 2 diabetes mellitus with other specified complication: Secondary | ICD-10-CM

## 2023-12-17 DIAGNOSIS — D6869 Other thrombophilia: Secondary | ICD-10-CM

## 2023-12-17 NOTE — Patient Instructions (Signed)
 Medication Instructions:  Your physician has recommended you make the following change in your medication:   1) STOP aspirin 2) STOP flecainide   *If you need a refill on your cardiac medications before your next appointment, please call your pharmacy*  Lab Work: TODAY: Lipid panel, LFTs, LP(a) If you have labs (blood work) drawn today and your tests are completely normal, you will receive your results only by: MyChart Message (if you have MyChart) OR A paper copy in the mail If you have any lab test that is abnormal or we need to change your treatment, we will call you to review the results.  Follow-Up: At [facility], you and your health needs are our priority.  As part of our continuing mission to provide you with exceptional heart care, we have created designated Provider Care Teams.  These Care Teams include your primary Cardiologist (physician) and Advanced Practice Providers (APPs -  Physician Assistants and Nurse Practitioners) who all work together to provide you with the care you need, when you need it.  We recommend signing up for the patient portal called "MyChart".  Sign up information is provided on this After Visit Summary.  MyChart is used to connect with patients for Virtual Visits (Telemedicine).  Patients are able to view lab/test results, encounter notes, upcoming appointments, etc.  Non-urgent messages can be sent to your provider as well.   To learn more about what you can do with MyChart, go to [email or web address].    Your next appointment:   6 month(s)  The format for your next appointment:   In Person  Provider:   Jari Favre, PA-C, Ronie Spies, PA-C, Robin Searing, NP, Tereso Newcomer, PA-C, or Perlie Gold, PA-C   Other Instructions   1st Floor: - Lobby - Registration  - Pharmacy  - Lab - Cafe  2nd Floor: - PV Lab - Diagnostic Testing (echo, CT, nuclear med)  3rd Floor: - Vacant  4th Floor: - TCTS (cardiothoracic surgery) - AFib Clinic -  Structural Heart Clinic - Vascular Surgery  - Vascular Ultrasound  5th Floor: - HeartCare Cardiology (general and EP) - Clinical Pharmacy for coumadin, hypertension, lipid, weight-loss medications, and med management appointments    Valet parking services will be available as well.

## 2023-12-18 LAB — HEPATIC FUNCTION PANEL
ALT: 62 IU/L — ABNORMAL HIGH (ref 0–44)
AST: 31 IU/L (ref 0–40)
Albumin: 4 g/dL (ref 3.8–4.9)
Alkaline Phosphatase: 95 IU/L (ref 44–121)
Bilirubin Total: 0.4 mg/dL (ref 0.0–1.2)
Bilirubin, Direct: 0.13 mg/dL (ref 0.00–0.40)
Total Protein: 7.4 g/dL (ref 6.0–8.5)

## 2023-12-18 LAB — LIPID PANEL
Chol/HDL Ratio: 3.1 ratio (ref 0.0–5.0)
Cholesterol, Total: 113 mg/dL (ref 100–199)
HDL: 37 mg/dL — ABNORMAL LOW (ref >39)
LDL Chol Calc (NIH): 51 mg/dL (ref 0–99)
Triglycerides: 142 mg/dL (ref 0–149)
VLDL Cholesterol Cal: 25 mg/dL (ref 5–40)

## 2023-12-18 LAB — LIPOPROTEIN A (LPA): Lipoprotein (a): 146.7 nmol/L — ABNORMAL HIGH (ref <75.0)

## 2023-12-24 ENCOUNTER — Telehealth: Payer: Self-pay | Admitting: Internal Medicine

## 2023-12-24 NOTE — Telephone Encounter (Signed)
Patient states needs refill for Adderall. [facility]acy [facility] [address] Patient phone number is [phone]. 

## 2023-12-24 NOTE — Telephone Encounter (Signed)
 Called patient.  Patient has his yearly OV with Dr. Maple Hudson on 01/08/2024.  Will forward request for Adderall to Dr. Maple Hudson.  Last OV was 01/01/2023.  Allergies  Allergen Reactions   Shrimp [Shellfish Allergy] Anaphylaxis   Banana Other (See Comments)    Pt gags   Watermelon Flavoring Agent (Non-Screening) Nausea And Vomiting   Almond Oil Itching    Roof of mouth itches   Other Itching    Grapes cause itching   Peanut-Containing Drug Products Itching and Cough   Sulfa Antibiotics Itching

## 2023-12-25 ENCOUNTER — Other Ambulatory Visit: Payer: Self-pay | Admitting: Student

## 2023-12-25 DIAGNOSIS — K219 Gastro-esophageal reflux disease without esophagitis: Secondary | ICD-10-CM

## 2023-12-25 MED ORDER — AMPHETAMINE-DEXTROAMPHET ER 30 MG PO CP24: 30.0000 mg | ORAL_CAPSULE | Freq: Every day | ORAL | 0 refills | Status: DC | PRN

## 2023-12-25 NOTE — Telephone Encounter (Signed)
 Adderall refilled

## 2023-12-26 NOTE — Telephone Encounter (Signed)
 Medication sent to pharmacy

## 2024-01-06 NOTE — Progress Notes (Deleted)
 HPI  male smoker followed for management of OSA and narcolepsy with cataplexy, complicated by allergic rhinitis, atrial fibrillation, dCHF, DM, HTN NPSG 11/23/00- AHI 20 per hour. Hypnagogic hallucination associated with dreaming. 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 Vivid dreams as soon as he falls asleep Multiple Sleep Latency Test 10/30/2012-pathologic daytime hypersomnia, nonspecific, compatible with idiopathic hypersomnia or narcolepsy. Mean latency 0.9 minutes with one sleep onset REM event CT chest 12/05/19- mediastinal and bilateral adenopathy, interstitial prominence, ASCVD NPSG 05/03/21- AHI 29.6/ hr, desaturation to 81%, CPAP to 16 (O2 sat on CPAP 16 was 94%), body weight  306 lbs   Suggest CPAP auto 10-20  =============================================================   01/01/23- 53 year old male Smoker (14 pkyrs)  followed for management of OSA and Narcolepsy with cataplexy,, Mediastinal / Hilar Adenopathy, ILD,  complicated by allergic rhinitis, Atrial Fibrillation/Eliquis flecainide/ Xarelto, dCHF, Diastolic Dysfunction, CAD, CVA, DM2, HTN, Pancreatitis, Obesity, Tobacco use,  Covid vax- 3 Phizer - Adderall XR 30 mg, 1 daily CPAP auto 10-20/ [facility]            AirSense 10 AutoSet Download-compliance 77%, AHI 1.6/ hr Body weight today-292 lbs Covid vax-3 Phizer Flu vax- -----Pt doing well. No issues with cpap machine  Download reviewed and compliance goals discussed.  He does not wear his CPAP if he is having a lot of head congestion at that time.  We discussed management of allergic rhinitis again.  Adderall is still helpful for daytime alertness.  He does nap if needed and is careful with driving.  06/29/77-53 year old male Smoker (14 pkyrs)  followed for management of OSA and Narcolepsy with cataplexy,, Mediastinal / Hilar Adenopathy, ILD,  complicated by allergic rhinitis, Atrial Fibrillation/Eliquis flecainide/ Xarelto, dCHF, Diastolic Dysfunction, CAD, CVA, DM2, HTN,  Pancreatitis, Obesity, Tobacco use,  Covid vax- 3 Phizer - Adderall XR 30 mg, 1 daily CPAP auto 10-20/ [facility]            AirSense 10 AutoSet Download-compliance  Body weight today-                       Due for CT chest for nodules?  ROS-see HPI  + = positive Constitutional:   No-   weight loss, night sweats, fevers, +chills,  +fatigue, lassitude. HEENT:   No-  headaches, difficulty swallowing, tooth/dental problems, sore throat,       No-  sneezing, itching, ear ache, + nasal congestion, post nasal drip,  CV:  No-   chest pain, orthopnea, PND, swelling in lower extremities, anasarca, dizziness, palpitations Resp: + shortness of breath with exertion or at rest.              No-   productive cough,  No non-productive cough,  No- coughing up of blood.              No-   change in color of mucus.  No- wheezing.   Skin: No-   rash or lesions. GI:  No-   heartburn, indigestion, abdominal pain, nausea, vomiting,  GU:  MS:  No-   joint pain or swelling.  Neuro-     +HPI Psych:  No- change in mood or affect. No depression or anxiety.  No memory loss.  OBJ- Physical Exam   General- Alert/ calm, Oriented, Affect-appropriate, Distress- none acute, +morbidly obese,   Skin- rash-none, lesions- none, excoriation- none Lymphadenopathy- none Head- atraumatic            Eyes- Gross vision intact, PERRLA, conjunctivae and secretions clear  Ears- Hearing, canals-normal            Nose- rhinitis/ turbinate edema, no-Septal dev, mucus, polyps, erosion, perforation             Throat- Mallampati III , mucosa clear , drainage- none, tonsils- atrophic Neck- flexible , trachea midline, no stridor , thyroid nl, carotid no bruit Chest - symmetrical excursion , unlabored           Heart/CV- RRR today, no murmur , no gallop  , no rub, nl s1 s2                           - JVD- none , edema- none, stasis changes- none, varices- none           Lung- clear to P&A, wheeze- none, cough- none ,  dullness-none, rub- none           Chest wall-  Abd-  Br/ Gen/ Rectal- Not done, not indicated Extrem- no edema Neuro- alert, nonfocal

## 2024-01-08 ENCOUNTER — Ambulatory Visit: Payer: 59 | Admitting: Internal Medicine

## 2024-01-10 ENCOUNTER — Ambulatory Visit (INDEPENDENT_AMBULATORY_CARE_PROVIDER_SITE_OTHER): Admitting: Student

## 2024-01-10 VITALS — HR 86 | Temp 97.6°F | Ht 69.0 in | Wt 288.8 lb

## 2024-01-10 DIAGNOSIS — E785 Hyperlipidemia, unspecified: Secondary | ICD-10-CM | POA: Diagnosis not present

## 2024-01-10 DIAGNOSIS — I10 Essential (primary) hypertension: Secondary | ICD-10-CM

## 2024-01-10 DIAGNOSIS — E118 Type 2 diabetes mellitus with unspecified complications: Secondary | ICD-10-CM | POA: Diagnosis not present

## 2024-01-10 DIAGNOSIS — Z7985 Long-term (current) use of injectable non-insulin antidiabetic drugs: Secondary | ICD-10-CM | POA: Diagnosis not present

## 2024-01-10 DIAGNOSIS — Z7984 Long term (current) use of oral hypoglycemic drugs: Secondary | ICD-10-CM

## 2024-01-10 LAB — GLUCOSE, CAPILLARY: Glucose-Capillary: 115 mg/dL — ABNORMAL HIGH (ref 70–99)

## 2024-01-10 LAB — POCT GLYCOSYLATED HEMOGLOBIN (HGB A1C): Hemoglobin A1C: 6.4 % — AB (ref 4.0–5.6)

## 2024-01-10 MED ORDER — CARVEDILOL 6.25 MG PO TABS: 6.2500 mg | ORAL_TABLET | Freq: Two times a day (BID) | ORAL | 11 refills | Status: AC

## 2024-01-10 MED ORDER — MOUNJARO 7.5 MG/0.5ML ~~LOC~~ SOAJ: 7.5000 mg | SUBCUTANEOUS | 3 refills | Status: DC

## 2024-01-10 NOTE — Assessment & Plan Note (Signed)
 Current medications include Mounjaro 7.5 mg weekly, metformin 1000 mg twice daily, Jardiance 25 mg daily.  Last A1c in 10/2023 elevated at 7.6.  A1c today is 6.4. He has been taking Mounjaro 5 mg weekly. Will increase to 7.5 mg/week as discussed in previous visits for further weight control and cardioprotective benefits. Patient deferred foot exam for his next visit in 3 months.  - Follow-up urine microalbumin -  Mounjaro 7.5 mg weekly, metformin 1000 mg twice daily, Jardiance 25 mg daily - Ophthalmology referral

## 2024-01-10 NOTE — Assessment & Plan Note (Signed)
 Current medication includes atorvastatin 80 mg daily.  Last lipid panel in 12/2023 normal with LDL of 51. - Continue atorvastatin 80 mg daily

## 2024-01-10 NOTE — Progress Notes (Signed)
 CC: Chronic condition follow-up   HPI: [name] is a 53 y.o. male living with a history stated below and presents today for chronic condition follow-up. Please see problem based assessment and plan for additional details.  Past Medical History:  Diagnosis Date   Diabetes mellitus    GERD (gastroesophageal reflux disease)    History of alcohol abuse    History of cocaine use    History of echocardiogram    a. Echo 4/14: Moderate LVH, vigorous LVEF, EF 65-70%, normal wall motion, grade 2 diastolic dysfunction, mildly dilated aortic root and ascending aorta, ascending aorta 40 mm, aortic root 38 mm, mild LAE   History of pancreatitis 01/22/2019   Hx of cardiovascular stress test    a. GXT 5/14: No ischemic changes  //  b. ETT-Myoview 3/16:  Low risk, no ischemia, EF 58%   Hyperlipidemia    Hypertension    Morbid obesity (HCC)    Paroxysmal atrial fibrillation (HCC)    Occurring in 2008, with several recurrence since then (including in the setting of + cocaine on UDS).   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 20 tablet 0   amLODipine (NORVASC) 10 MG tablet TAKE 1 TABLET BY MOUTH EVERY DAY 30 tablet 11   amphetamine-dextroamphetamine (ADDERALL XR) 30 MG 24 hr capsule Take 1 capsule (30 mg total) by mouth daily as needed (focus). 1 daily as needed 30 capsule 0   apixaban (ELIQUIS) 5 MG TABS tablet Take 1 tablet (5 mg total) by mouth 2 (two) times daily. 60 tablet 11   atorvastatin (LIPITOR) 80 MG tablet  Take 1 tablet (80 mg total) by mouth daily. 90 tablet 3   Blood Glucose Monitoring Suppl DEVI 1 each by Does not apply route in the morning, at noon, and at bedtime. May substitute to any manufacturer covered by patient's insurance. 1 each 0   cetirizine (ZYRTEC ALLERGY) 10 MG tablet Take 1 tablet (10 mg total) by mouth daily. 90 tablet 2   empagliflozin (JARDIANCE) 25 MG TABS tablet Take 1 tablet (25 mg total) by mouth daily. 90 tablet 2   glucose blood (ACCU-CHEK GUIDE) test strip Please use strips to test morning fasting sugars and one hour after each meal, total 4x daily 200 strip 5   hydrALAZINE (APRESOLINE) 25 MG tablet TAKE 1 TABLET (25 MG) BY MOUTH IN THE MORNING AND AT BEDTIME 60 tablet 3   metFORMIN (GLUCOPHAGE) 1000 MG tablet Take 1 tablet (1,000 mg total) by mouth 2 (two) times daily with a meal. 60 tablet 11   olmesartan (BENICAR) 20 MG tablet TAKE 1 TABLET BY MOUTH EVERY DAY 30 tablet 1   No current facility-administered medications on file prior to visit.    Family History  Problem Relation Age of Onset   Diabetes Mother    Heart attack Father 19   Stroke Maternal Grandmother    Stroke Paternal  Grandmother    Diabetes Paternal Grandfather     Social History   Socioeconomic History   Marital status: Single    Spouse name: Not on file   Number of children: 0   Years of education: 12   Highest education level: Not on file  Occupational History   Occupation: Unemployed    Employer: UNEMPLOYED  Tobacco Use   Smoking status: Former    Current packs/day: 0.00    Average packs/day: 0.5 packs/day for 28.0 years (14.0 ttl pk-yrs)    Types: Cigarettes    Start date: 10/15/1994    Quit date: 10/15/2022    Years since quitting: 1.2   Smokeless tobacco: Former    Types: Snuff   Tobacco comments:    1 pack per week HEI 06/09/22  Vaping Use   Vaping status: Never Used  Substance and Sexual Activity   Alcohol use: Yes    Alcohol/week: 6.0 standard drinks of alcohol     Types: 6 Standard drinks or equivalent per week    Comment: rarely   Drug use: Not Currently    Comment: cocaine in the past, none currently   Sexual activity: Not on file  Other Topics Concern   Not on file  Social History Narrative   Fun: Photographer       Lives in [place] with sister.      Unemployed, was working a Office manager job   Social Drivers of Corporate investment banker Strain: Not on file  Food Insecurity: No Food Insecurity (02/02/2023)   Hunger Vital Sign    Worried About Running Out of Food in the Last Year: Never true    Ran Out of Food in the Last Year: Never true  Transportation Needs: Unmet Transportation Needs (02/02/2023)   PRAPARE - Administrator, Civil Service (Medical): Yes    Lack of Transportation (Non-Medical): No  Physical Activity: Inactive (12/11/2022)   Exercise Vital Sign    Days of Exercise per Week: 0 days    Minutes of Exercise per Session: 0 min  Stress: Not on file  Social Connections: Socially Isolated (09/06/2022)   Social Connection and Isolation Panel [NHANES]    Frequency of Communication with Friends and Family: More than three times a week    Frequency of Social Gatherings with Friends and Family: More than three times a week    Attends Religious Services: Never    Database administrator or Organizations: No    Attends Banker Meetings: Never    Marital Status: Never married  Intimate Partner Violence: Not At Risk (02/02/2023)   Humiliation, Afraid, Rape, and Kick questionnaire    Fear of Current or Ex-Partner: No    Emotionally Abused: No    Physically Abused: No    Sexually Abused: No    Review of Systems: ROS negative except for what is noted on the assessment and plan.  Vitals:   01/10/24 1010  Pulse: 86  Temp: 97.6 F (36.4 C)  TempSrc: Oral  SpO2: 99%  Weight: 288 lb 12.8 oz (131 kg)  Height: 5\' 9"  (1.753 m)    Physical Exam: Constitutional: well-appearing in no acute distress HENT:  normocephalic atraumatic, mucous membranes moist Eyes: conjunctiva non-erythematous Neck: supple Cardiovascular: regular rate and rhythm, no m/r/g Pulmonary/Chest: normal work of breathing on room air, lungs clear to auscultation bilaterally Abdominal: soft, non-tender, non-distended MSK: normal bulk and tone Neurological: alert & oriented x 3, 5/5 strength in bilateral upper  and lower extremities, normal gait Skin: warm and dry  Assessment & Plan:   Dyslipidemia Current medication includes atorvastatin 80 mg daily.  Last lipid panel in 12/2023 normal with LDL of 51. - Continue atorvastatin 80 mg daily  Benign essential hypertension Current medication includes amlodipine 10 mg daily, olmesartan 20 mg daily, and hydralazine 25 mg twice daily.  Blood pressure today in 130s/70s.  Not measuring them at home so counseled patient on recording his BP around once per week and brining this to subsequent visits  Last BMP was in 09/2023 that was notable for a creatinine of 1.28, which is roughly his baseline.  He follows with Dr. Thedore Mins for CKD. - Continue olmesartan 20 mg daily, hydralazine 25 mg twice daily, and amlodipine 10 mg daily  Type 2 diabetes mellitus with complications (HCC) Current medications include Mounjaro 7.5 mg weekly, metformin 1000 mg twice daily, Jardiance 25 mg daily.  Last A1c in 10/2023 elevated at 7.6.  A1c today is 6.4. He has been taking Mounjaro 5 mg weekly. Will increase to 7.5 mg/week as discussed in previous visits for further weight control and cardioprotective benefits. Patient deferred foot exam for his next visit in 3 months.  - Follow-up urine microalbumin -  Mounjaro 7.5 mg weekly, metformin 1000 mg twice daily, Jardiance 25 mg daily - Ophthalmology referral  Patient discussed with Dr. Susa Raring, MD  [facility] Internal Medicine, PGY-1 Date 01/10/2024 Time 10:48 AM

## 2024-01-10 NOTE — Assessment & Plan Note (Signed)
 Current medication includes amlodipine 10 mg daily, olmesartan 20 mg daily, and hydralazine 25 mg twice daily.  Blood pressure today in 130s/70s.  Not measuring them at home so counseled patient on recording his BP around once per week and brining this to subsequent visits  Last BMP was in 09/2023 that was notable for a creatinine of 1.28, which is roughly his baseline.  He follows with Dr. Thedore Mins for CKD. - Continue olmesartan 20 mg daily, hydralazine 25 mg twice daily, and amlodipine 10 mg daily

## 2024-01-10 NOTE — Patient Instructions (Signed)
 Thank you so much for coming to the clinic today!   I have sent in your refills. Please continue taking your medications as prescribed.   If you have any questions please feel free to the call the clinic at anytime at [phone]. It was a pleasure seeing you!  Best, Dr. Rayvon Char

## 2024-01-11 LAB — MICROALBUMIN / CREATININE URINE RATIO
Creatinine, Urine: 95.4 mg/dL
Microalb/Creat Ratio: 413 mg/g{creat} — ABNORMAL HIGH (ref 0–29)
Microalbumin, Urine: 393.9 ug/mL

## 2024-01-11 NOTE — Progress Notes (Signed)
 Attempt #1 to contact patient regarding elevated microalbumin. There is an increase likely secondary from his history of poorly controlled diabetes. Will continue to encourage healthy lifestyle modifications and importance of consistently taking his medications.

## 2024-01-11 NOTE — Progress Notes (Signed)
 Internal Medicine Clinic Attending  Case discussed with the resident at the time of the visit.  We reviewed the resident's history and exam and pertinent patient test results.  I agree with the assessment, diagnosis, and plan of care documented in the resident's note.   Patient has severe proteinuria. He is established with Nephrology and is already on ARB & SGLT2. Could consider spironolactone in the future

## 2024-02-02 ENCOUNTER — Other Ambulatory Visit: Payer: Self-pay | Admitting: Student

## 2024-02-05 NOTE — Telephone Encounter (Signed)
 Medication sent to pharmacy

## 2024-02-11 ENCOUNTER — Ambulatory Visit: Payer: 59 | Admitting: Podiatry

## 2024-02-11 ENCOUNTER — Encounter: Payer: Self-pay | Admitting: Podiatry

## 2024-02-11 DIAGNOSIS — M79676 Pain in unspecified toe(s): Secondary | ICD-10-CM | POA: Diagnosis not present

## 2024-02-11 DIAGNOSIS — E118 Type 2 diabetes mellitus with unspecified complications: Secondary | ICD-10-CM | POA: Diagnosis not present

## 2024-02-11 DIAGNOSIS — B351 Tinea unguium: Secondary | ICD-10-CM

## 2024-02-11 NOTE — Progress Notes (Signed)
 This patient returns to my office for at risk foot care.  This patient requires this care by a professional since this patient will be at risk due to having diabetes  coagulation defect and kidney disease.  Patient is prescribed eliquis .  This patient is unable t  o cut nails himself since the patient cannot reach his nails.These nails are painful walking and wearing shoes.  This patient has not been seen in over one year.This patient presents for at risk foot care today.  General Appearance  Alert, conversant and in no acute stress.  Vascular  Dorsalis pedis and posterior tibial  pulses are palpable  bilaterally.  Capillary return is within normal limits  bilaterally. Temperature is within normal limits  bilaterally.  Neurologic  Senn-Weinstein monofilament wire test within normal limits  bilaterally. Muscle power within normal limits bilaterally.  Nails Thick disfigured discolored nails with subungual debris  from hallux to fifth toes bilaterally. No evidence of bacterial infection or drainage bilaterally.  Orthopedic  No limitations of motion  feet .  No crepitus or effusions noted.  No bony pathology or digital deformities noted.  HAV  right foot.  Pes planus  B/L.  Skin  normotropic skin with no porokeratosis noted bilaterally.  No signs of infections or ulcers noted.     Onychomycosis  Pain in right toes  Pain in left toes  Consent was obtained for treatment procedures.   Mechanical debridement of nails 1-5  bilaterally performed with a nail nipper.  Filed with dremel without incident.    Return office visit    3 months                  Told patient to return for periodic foot care and evaluation due to potential at risk complications.   Ruffin Cotton DPM

## 2024-02-16 ENCOUNTER — Other Ambulatory Visit: Payer: Self-pay | Admitting: Student

## 2024-02-16 DIAGNOSIS — J302 Other seasonal allergic rhinitis: Secondary | ICD-10-CM

## 2024-02-16 DIAGNOSIS — E1165 Type 2 diabetes mellitus with hyperglycemia: Secondary | ICD-10-CM

## 2024-02-18 NOTE — Telephone Encounter (Signed)
 Medication sent to pharmacy

## 2024-02-26 ENCOUNTER — Telehealth: Payer: Self-pay | Admitting: Internal Medicine

## 2024-02-26 NOTE — Telephone Encounter (Signed)
 Copied from CRM [id number]. Topic: Clinical - Medication Refill >> Feb 26, 2024  2:47 PM Ilean Mall F wrote: Medication: amphetamine -dextroamphetamine  (ADDERALL XR) 30 MG 24 hr capsule   Has the patient contacted their pharmacy? Yes; pt stated he never has refills for this medication and always needs to call it in. (Agent: If no, request that the patient contact the pharmacy for the refill. If patient does not wish to contact the pharmacy document the reason why and proceed with request.) (Agent: If yes, when and what did the pharmacy advise?)  This is the patient's preferred pharmacy:  [phone] [facility] Nestle, [place] - [address] [place] [address] Phone: [phone] Fax: [phone]  Is this the correct pharmacy for this prescription? Yes If no, delete pharmacy and type the correct one.   Has the prescription been filled recently? Yes  Is the patient out of the medication? Yes  Has the patient been seen for an appointment in the last year OR does the patient have an upcoming appointment? Yes  Can we respond through MyChart? No  Agent: Please be advised that Rx refills may take up to 3 business days. We ask that you follow-up with your pharmacy.

## 2024-02-26 NOTE — Telephone Encounter (Signed)
 Please advise controlled substance refill request. Pt last seen on 01-01-2023.    Pt has an upcoming appointment with Dr Linder Revere on 03-11-2024.

## 2024-02-27 MED ORDER — AMPHETAMINE-DEXTROAMPHET ER 30 MG PO CP24: 30.0000 mg | ORAL_CAPSULE | Freq: Every day | ORAL | 0 refills | Status: DC | PRN

## 2024-02-27 NOTE — Telephone Encounter (Signed)
**Note De-identified  [place] Obfuscation** Please advise 

## 2024-02-27 NOTE — Telephone Encounter (Signed)
 Adderall refilled

## 2024-03-06 ENCOUNTER — Other Ambulatory Visit: Payer: Self-pay | Admitting: Student

## 2024-03-06 DIAGNOSIS — E118 Type 2 diabetes mellitus with unspecified complications: Secondary | ICD-10-CM

## 2024-03-06 NOTE — Telephone Encounter (Signed)
 Medication discontinued 11/12/23

## 2024-03-10 NOTE — Progress Notes (Signed)
 HPI  male smoker followed for management of OSA and narcolepsy with cataplexy, complicated by allergic rhinitis, atrial fibrillation, dCHF, DM, HTN NPSG 11/23/00- AHI 20 per hour. Hypnagogic hallucination associated with dreaming. 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 Vivid dreams as soon as he falls asleep Multiple Sleep Latency Test 10/30/2012-pathologic daytime hypersomnia, nonspecific, compatible with idiopathic hypersomnia or narcolepsy. Mean latency 0.9 minutes with one sleep onset REM event CT chest 12/05/19- mediastinal and bilateral adenopathy, interstitial prominence, ASCVD NPSG 05/03/21- AHI 29.6/ hr, desaturation to 81%, CPAP to 16 (O2 sat on CPAP 16 was 94%), body weight  306 lbs   Suggest CPAP auto 10-20  =============================================================   01/01/23- 53 year old male Smoker (14 pkyrs)  followed for management of OSA and Narcolepsy with cataplexy,, Mediastinal / Hilar Adenopathy, ILD,  complicated by allergic rhinitis, Atrial Fibrillation/Eliquis  flecainide / Xarelto , dCHF, Diastolic Dysfunction, CAD, CVA, DM2, HTN, Pancreatitis, Obesity, Tobacco use,  Covid vax- 3 Phizer - Adderall XR 30 mg, 1 daily CPAP auto 10-20/ [facility]            AirSense 10 AutoSet Download-compliance 77%, AHI 1.6/ hr Body weight today-292 lbs Covid vax-3 Phizer Flu vax- -----Pt doing well. No issues with cpap machine  Download reviewed and compliance goals discussed.  He does not wear his CPAP if he is having a lot of head congestion at that time.  We discussed management of allergic rhinitis again.  Adderall is still helpful for daytime alertness.  He does nap if needed and is careful with driving.  1/61/09-  53 year old male Smoker (14 pkyrs)  followed for management of OSA and Narcolepsy with cataplexy,, Mediastinal / Hilar Adenopathy, ILD,  complicated by allergic rhinitis, Atrial Fibrillation/Eliquis  flecainide / Xarelto , dCHF, Diastolic Dysfunction, CAD, CVA, DM2, HTN,  Pancreatitis, Obesity, Tobacco use,  Covid vax- 3 Phizer - Adderall XR 30 mg, 1 daily CPAP auto 10-20/ [facility]            AirSense 10 AutoSet Download-compliance 83%, AHI 7.2/hr Body weight today-290 lbs -----Sleeping well.  Patient forgets to turn CPAP off when he gets up to go to bathroom.  Patient has frequent awakenings during the night on a regular basis. Download reviewed. Machine is about 53 yrs old.  Adderall works well. No cataplexy. Occasional naps.  Bothersome nasal congestion. We discussed consistent use of Flonase  first. Avoid decongestant sprays. He blames his baseboard heat.   ROS-see HPI  + = positive Constitutional:   No-   weight loss, night sweats, fevers, +chills,  +fatigue, lassitude. HEENT:   No-  headaches, difficulty swallowing, tooth/dental problems, sore throat,       No-  sneezing, itching, ear ache, + nasal congestion, post nasal drip,  CV:  No-   chest pain, orthopnea, PND, swelling in lower extremities, anasarca, dizziness, palpitations Resp: + shortness of breath with exertion or at rest.              No-   productive cough,  No non-productive cough,  No- coughing up of blood.              No-   change in color of mucus.  No- wheezing.   Skin: No-   rash or lesions. GI:  No-   heartburn, indigestion, abdominal pain, nausea, vomiting,  GU:  MS:  No-   joint pain or swelling.  Neuro-     +HPI Psych:  No- change in mood or affect. No depression or anxiety.  No memory loss.  OBJ- Physical Exam   General-  Alert/ calm, Oriented, Affect-appropriate, Distress- none acute, +morbidly obese,   Skin- rash-none, lesions- none, excoriation- none Lymphadenopathy- none Head- atraumatic            Eyes- Gross vision intact, PERRLA, conjunctivae and secretions clear            Ears- Hearing, canals-normal            Nose- rhinitis/ turbinate +edema/ stuffy, no-Septal dev, mucus, polyps, erosion, perforation             Throat- Mallampati III , mucosa clear , drainage-  none, tonsils- atrophic Neck- flexible , trachea midline, no stridor , thyroid  nl, carotid no bruit Chest - symmetrical excursion , unlabored           Heart/CV- RRR today, no murmur , no gallop  , no rub, nl s1 s2                           - JVD- none , edema- none, stasis changes- none, varices- none           Lung- clear to P&A, wheeze- none, cough- none , dullness-none, rub- none           Chest wall-  Abd-  Br/ Gen/ Rectal- Not done, not indicated Extrem- no edema Neuro- alert, nonfocal

## 2024-03-11 ENCOUNTER — Ambulatory Visit (INDEPENDENT_AMBULATORY_CARE_PROVIDER_SITE_OTHER): Admitting: Internal Medicine

## 2024-03-11 ENCOUNTER — Encounter: Payer: Self-pay | Admitting: Internal Medicine

## 2024-03-11 VITALS — BP 150/82 | HR 75 | Temp 97.6°F | Ht 69.0 in | Wt 290.4 lb

## 2024-03-11 DIAGNOSIS — J302 Other seasonal allergic rhinitis: Secondary | ICD-10-CM

## 2024-03-11 DIAGNOSIS — F17200 Nicotine dependence, unspecified, uncomplicated: Secondary | ICD-10-CM

## 2024-03-11 DIAGNOSIS — G47411 Narcolepsy with cataplexy: Secondary | ICD-10-CM | POA: Diagnosis not present

## 2024-03-11 DIAGNOSIS — G4733 Obstructive sleep apnea (adult) (pediatric): Secondary | ICD-10-CM | POA: Diagnosis not present

## 2024-03-11 DIAGNOSIS — J3089 Other allergic rhinitis: Secondary | ICD-10-CM | POA: Diagnosis not present

## 2024-03-11 NOTE — Patient Instructions (Signed)
 We can continue CPAP auto 10-20 for now.   We can continue the adderall- let us  know when you need refill.  Try using 2 puffs Flonase  (otc) each nostril every night at bedtime. We will see if that can gradually improve your nose so you can breathe better.

## 2024-03-19 MED ORDER — MOUNJARO 5 MG/0.5ML ~~LOC~~ SOAJ: 5.0000 mg | SUBCUTANEOUS | 2 refills | Status: DC

## 2024-03-19 NOTE — Addendum Note (Signed)
 Addended by: Gudrun Axe on: 03/19/2024 04:42 PM   Modules accepted: Orders

## 2024-03-27 ENCOUNTER — Other Ambulatory Visit: Payer: Self-pay | Admitting: Internal Medicine

## 2024-03-27 MED ORDER — AMPHETAMINE-DEXTROAMPHET ER 30 MG PO CP24: 30.0000 mg | ORAL_CAPSULE | Freq: Every day | ORAL | 0 refills | Status: DC | PRN

## 2024-03-27 NOTE — Telephone Encounter (Signed)
 Copied from CRM [id number]. Topic: Clinical - Medication Refill >> Mar 27, 2024 11:41 AM Corean Deutscher wrote: Medication: amphetamine -dextroamphetamine  (ADDERALL XR) 30 MG 24 hr capsule  Has the patient contacted their pharmacy? No (Agent: If no, request that the patient contact the pharmacy for the refill. If patient does not wish to contact the pharmacy document the reason why and proceed with request.) (Agent: If yes, when and what did the pharmacy advise?)  This is the patient's preferred pharmacy:  [phone], [place] - [address] [place] [address] Phone: [phone] Fax: [phone]  Is this the correct pharmacy for this prescription? Yes If no, delete pharmacy and type the correct one.   Has the prescription been filled recently? Yes  Is the patient out of the medication? Yes  Has the patient been seen for an appointment in the last year OR does the patient have an upcoming appointment? Yes  Can we respond through MyChart? No  Agent: Please be advised that Rx refills may take up to 3 business days. We ask that you follow-up with your pharmacy.

## 2024-03-27 NOTE — Telephone Encounter (Signed)
 Adderall refilled

## 2024-03-29 NOTE — Assessment & Plan Note (Signed)
 Consider room humidifier for home. Wee if he does better as indoor heating season ends. Emphasize use of Flonase 

## 2024-03-29 NOTE — Assessment & Plan Note (Signed)
 Benefits from CPAP with good compliance and control Plan- refit mask if needed.

## 2024-03-29 NOTE — Assessment & Plan Note (Signed)
 Cataplexy no longer very evident Plan- continue adderall/ naps/ safe driving

## 2024-04-08 ENCOUNTER — Ambulatory Visit: Payer: Self-pay | Admitting: Student

## 2024-04-08 VITALS — BP 135/82 | HR 80 | Temp 98.0°F | Ht 69.0 in | Wt 284.0 lb

## 2024-04-08 DIAGNOSIS — Z7984 Long term (current) use of oral hypoglycemic drugs: Secondary | ICD-10-CM

## 2024-04-08 DIAGNOSIS — Z7985 Long-term (current) use of injectable non-insulin antidiabetic drugs: Secondary | ICD-10-CM

## 2024-04-08 DIAGNOSIS — I110 Hypertensive heart disease with heart failure: Secondary | ICD-10-CM | POA: Diagnosis not present

## 2024-04-08 DIAGNOSIS — R399 Unspecified symptoms and signs involving the genitourinary system: Secondary | ICD-10-CM | POA: Diagnosis not present

## 2024-04-08 DIAGNOSIS — E118 Type 2 diabetes mellitus with unspecified complications: Secondary | ICD-10-CM | POA: Diagnosis not present

## 2024-04-08 DIAGNOSIS — I5032 Chronic diastolic (congestive) heart failure: Secondary | ICD-10-CM | POA: Diagnosis not present

## 2024-04-08 DIAGNOSIS — N529 Male erectile dysfunction, unspecified: Secondary | ICD-10-CM

## 2024-04-08 DIAGNOSIS — I10 Essential (primary) hypertension: Secondary | ICD-10-CM

## 2024-04-08 LAB — POCT GLYCOSYLATED HEMOGLOBIN (HGB A1C): Hemoglobin A1C: 6.5 % — AB (ref 4.0–5.6)

## 2024-04-08 LAB — GLUCOSE, CAPILLARY: Glucose-Capillary: 98 mg/dL (ref 70–99)

## 2024-04-08 MED ORDER — TADALAFIL 10 MG PO TABS: 10.0000 mg | ORAL_TABLET | Freq: Every day | ORAL | 2 refills | Status: AC

## 2024-04-08 MED ORDER — MOUNJARO 7.5 MG/0.5ML ~~LOC~~ SOAJ: 7.5000 mg | SUBCUTANEOUS | 2 refills | Status: DC

## 2024-04-08 NOTE — Progress Notes (Signed)
 CC: Routine Follow Up for hypertension and diabetes after last office visit 01/10/2024  HPI:  [name]. is a 53 y.o. male with pertinent PMH of paroxysmal A-fib on Eliquis , HFpEF, HTN, OSA, T2DM with retinopathy, CKD stage II/IIIa, prior stroke, TUD in remission, and obesity who presents as above. Please see assessment and plan below for further details.  Medications: Current Outpatient Medications  Medication Instructions   Accu-Chek Softclix Lancets lancets TEST UP TO 4 TIMES A DAY   acetaminophen  (TYLENOL ) 650 mg, Oral, Every 6 hours PRN   amLODipine  (NORVASC ) 10 mg, Oral, Daily   amphetamine -dextroamphetamine  (ADDERALL XR) 30 MG 24 hr capsule 30 mg, Oral, Daily PRN, 1 daily as needed   apixaban  (ELIQUIS ) 5 mg, Oral, 2 times daily   atorvastatin  (LIPITOR ) 80 mg, Oral, Daily   Blood Glucose Monitoring Suppl DEVI 1 each, Does not apply, 3 times daily, May substitute to any manufacturer covered by patient's insurance.   carvedilol  (COREG ) 6.25 mg, Oral, 2 times daily with meals   cetirizine  (ZYRTEC ) 10 mg, Oral, Daily   empagliflozin  (JARDIANCE ) 25 mg, Oral, Daily   glucose blood (ACCU-CHEK GUIDE) test strip Please use strips to test morning fasting sugars and one hour after each meal, total 4x daily   hydrALAZINE  (APRESOLINE ) 25 MG tablet TAKE 1 TABLET (25 MG) BY MOUTH IN THE MORNING AND AT BEDTIME   metFORMIN  (GLUCOPHAGE ) 1,000 mg, Oral, 2 times daily with meals   Mounjaro  7.5 mg, Subcutaneous, Weekly   olmesartan  (BENICAR ) 20 mg, Oral, Daily   pantoprazole  (PROTONIX ) 40 mg, Oral, Daily   tadalafil (CIALIS) 10 mg, Oral, Daily     Review of Systems:   Pertinent items noted in HPI and/or A&P.  Physical Exam:  Vitals:   04/08/24 0904  BP: 135/82  Pulse: 80  Temp: 98 F (36.7 C)  TempSrc: Oral  SpO2: 95%  Weight: 284 lb (128.8 kg)  Height: 5' 9 (1.753 m)    Constitutional: Well-appearing adult male. In no acute distress. HEENT: Normocephalic,  atraumatic, Sclera non-icteric, PERRL, EOM intact Cardio:Regular rate and rhythm. 2+ bilateral radial pulses. Pulm:Clear to auscultation bilaterally. Normal work of breathing on room air. MSK: Trace lower extremity edema bilaterally Skin:Warm and dry. Neuro:Alert and oriented x3. No focal deficit noted. Psych:Pleasant mood and affect.   Assessment & Plan:   Benign essential hypertension Blood pressure remains controlled on stable regimen.  135/82 today.  Last BMP December 2024.  He has been on hydralazine  for a while but is not on a thiazide.  He has been on HCTZ in the past and there is no documentation of significant side effect or reason for discontinuation.  At his next visit we could start chlorthalidone in place of hydralazine  to decrease the number of twice daily pills. - Continue olmesartan  20 mg daily, hydralazine  25 mg twice daily, and amlodipine  10 mg daily - BMP today  Chronic diastolic heart failure (HCC) Patient hospitalized in 2014 with dyspnea on exertion thought to be due to diastolic heart failure with elevated BNP at 1700 and echo showing EF of 65 to 70% with moderate LVH and grade 2 diastolic dysfunction.  Since then he presented to the ED with similar symptoms and was treated with IV diuretic however he has not been on home diuretics in the last 5 years.  Most recent echocardiogram was a TEE on 05/20/2021 which showed LVEF of 60 to 65% with severe LVH and TTE from 04/15/2021 showed normal LV diastolic function.  He  is having some urinary frequency but is not having any other signs of clinical heart failure.  No orthopnea, dyspnea on exertion, or cough.  Type 2 diabetes mellitus with complications (HCC) A1c today stable at 6.5.  Last urine microalbumin creatinine ratio on 01/10/2024 was 413.  Foot exam done by podiatry on 02/11/2024.  No recent eye exam but has an eye doctor who we will call to get this scheduled. - Increase Mounjaro  to 7.5 mg weekly - Continue metformin  1000 mg  twice daily and Jardiance  25 mg daily - Return in 3 months for repeat A1c  Erectile dysfunction As previously noted the patient has erectile dysfunction and lower urinary tract symptoms that are largely stable but still bothersome.  Discussed urology referral and PSA.  He has been referred to urology in the past but has not had an appointment.  Today we will start with a PSA and start tadalafil. - PSA today - Start tadalafil 10 mg daily - If inadequate response referred to urology again    Patient discussed with Dr. Layman Bee  Fairy Pool, DO [facility] Internal Medicine Resident PGY-2 Clinic Phone: [phone] Please contact the on call pager at [phone] for any urgent or emergent needs.

## 2024-04-08 NOTE — Progress Notes (Signed)
 Internal Medicine Clinic Attending  Case discussed with the resident at the time of the visit.  We reviewed the resident's history and exam and pertinent patient test results.  I agree with the assessment, diagnosis, and plan of care documented in the resident's note.

## 2024-04-08 NOTE — Assessment & Plan Note (Signed)
 Blood pressure remains controlled on stable regimen.  135/82 today.  Last BMP December 2024.  He has been on hydralazine  for a while but is not on a thiazide.  He has been on HCTZ in the past and there is no documentation of significant side effect or reason for discontinuation.  At his next visit we could start chlorthalidone in place of hydralazine  to decrease the number of twice daily pills. - Continue olmesartan  20 mg daily, hydralazine  25 mg twice daily, and amlodipine  10 mg daily - BMP today

## 2024-04-08 NOTE — Patient Instructions (Addendum)
 Thank you, [name], for allowing us  to provide your care today. Today we discussed . . .  > Blood pressure and diabetes       - You are doing a great job with your blood pressure and diabetes.  Please continue to take your blood pressure medications as you have been and we will increase your Mounjaro  to 7.5 mg weekly.  If you are doing well on the 7.5 mg after 6 to 8 weeks we can talk about increasing it again or we can wait until you see us  in 3 months and increase it then.  Please get an appointment with your eye doctor to get your diabetic eye exam. > Frequent urination       - We are going to check a PSA today and I will call you with results and further plan.  We will start a medication called tadalafil (Cialis) 10 mg daily which should help with your urinary symptoms and erectile symptoms.  This medication can cause dizziness especially when getting up from a seated position.  If this happens please stop the medication and call our clinic.  We will plan to reevaluate your symptoms at your next visit in 3 months but if they are not improving before then please give us  a call and we can talk about having you seen at the urology office. > Grapefruit       - I would recommend eating less grapefruit such as only having one half of a grapefruit instead of a whole grapefruit.  I would also avoid having any extra grapefruit juice to avoid any interactions with your medications.   I have ordered the following labs for you:   Lab Orders         Glucose, capillary         BMP8+Anion Gap         PSA         POC Hbg A1C       Follow up: 3 months    Remember:  Should you have any questions or concerns please call the internal medicine clinic at [phone].     Fairy Pool, DO [facility]

## 2024-04-08 NOTE — Assessment & Plan Note (Signed)
 Patient hospitalized in 2014 with dyspnea on exertion thought to be due to diastolic heart failure with elevated BNP at 1700 and echo showing EF of 65 to 70% with moderate LVH and grade 2 diastolic dysfunction.  Since then he presented to the ED with similar symptoms and was treated with IV diuretic however he has not been on home diuretics in the last 5 years.  Most recent echocardiogram was a TEE on 05/20/2021 which showed LVEF of 60 to 65% with severe LVH and TTE from 04/15/2021 showed normal LV diastolic function.  He is having some urinary frequency but is not having any other signs of clinical heart failure.  No orthopnea, dyspnea on exertion, or cough.

## 2024-04-08 NOTE — Assessment & Plan Note (Signed)
 As previously noted the patient has erectile dysfunction and lower urinary tract symptoms that are largely stable but still bothersome.  Discussed urology referral and PSA.  He has been referred to urology in the past but has not had an appointment.  Today we will start with a PSA and start tadalafil. - PSA today - Start tadalafil 10 mg daily - If inadequate response referred to urology again

## 2024-04-08 NOTE — Assessment & Plan Note (Signed)
 A1c today stable at 6.5.  Last urine microalbumin creatinine ratio on 01/10/2024 was 413.  Foot exam done by podiatry on 02/11/2024.  No recent eye exam but has an eye doctor who we will call to get this scheduled. - Increase Mounjaro  to 7.5 mg weekly - Continue metformin  1000 mg twice daily and Jardiance  25 mg daily - Return in 3 months for repeat A1c

## 2024-04-09 ENCOUNTER — Ambulatory Visit: Payer: Self-pay | Admitting: Student

## 2024-04-09 DIAGNOSIS — E875 Hyperkalemia: Secondary | ICD-10-CM

## 2024-04-09 LAB — BMP8+ANION GAP
Anion Gap: 18 mmol/L (ref 10.0–18.0)
BUN/Creatinine Ratio: 21 — ABNORMAL HIGH (ref 9–20)
BUN: 27 mg/dL — ABNORMAL HIGH (ref 6–24)
CO2: 16 mmol/L — ABNORMAL LOW (ref 20–29)
Calcium: 9.6 mg/dL (ref 8.7–10.2)
Chloride: 105 mmol/L (ref 96–106)
Creatinine, Ser: 1.31 mg/dL — ABNORMAL HIGH (ref 0.76–1.27)
Glucose: 93 mg/dL (ref 70–99)
Potassium: 5.4 mmol/L — ABNORMAL HIGH (ref 3.5–5.2)
Sodium: 139 mmol/L (ref 134–144)
eGFR: 65 mL/min/{1.73_m2} (ref >59)

## 2024-04-09 LAB — PSA: Prostate Specific Ag, Serum: 0.4 ng/mL (ref 0.0–4.0)

## 2024-04-09 NOTE — Progress Notes (Signed)
 Called and discussed the results of lab work with the patient.  PSA is normal.  BMP shows slightly elevated potassium.  We will order a stat BMP and he is told to go to the vascular center [facility] to get this done tomorrow.

## 2024-04-11 ENCOUNTER — Other Ambulatory Visit (INDEPENDENT_AMBULATORY_CARE_PROVIDER_SITE_OTHER)

## 2024-04-11 DIAGNOSIS — E875 Hyperkalemia: Secondary | ICD-10-CM

## 2024-04-11 LAB — BASIC METABOLIC PANEL WITH GFR
BUN/Creatinine Ratio: 18 (ref 9–20)
BUN: 37 mg/dL — ABNORMAL HIGH (ref 6–24)
CO2: 18 mmol/L — ABNORMAL LOW (ref 20–29)
Calcium: 9.2 mg/dL (ref 8.7–10.2)
Chloride: 109 mmol/L — ABNORMAL HIGH (ref 96–106)
Creatinine, Ser: 2.06 mg/dL — ABNORMAL HIGH (ref 0.76–1.27)
Glucose: 108 mg/dL — ABNORMAL HIGH (ref 70–99)
Potassium: 5.8 mmol/L — ABNORMAL HIGH (ref 3.5–5.2)
Sodium: 139 mmol/L (ref 134–144)
eGFR: 38 mL/min/{1.73_m2} — ABNORMAL LOW (ref >59)

## 2024-04-14 ENCOUNTER — Other Ambulatory Visit: Payer: Self-pay | Admitting: *Deleted

## 2024-04-14 ENCOUNTER — Telehealth: Payer: Self-pay | Admitting: *Deleted

## 2024-04-14 ENCOUNTER — Other Ambulatory Visit (HOSPITAL_COMMUNITY)
Admission: AD | Admit: 2024-04-14 | Discharge: 2024-04-14 | Disposition: A | Discharge status: Home / Self Care | Source: Ambulatory Visit | Attending: *Deleted | Admitting: *Deleted

## 2024-04-14 DIAGNOSIS — E875 Hyperkalemia: Secondary | ICD-10-CM

## 2024-04-14 NOTE — Telephone Encounter (Signed)
 Call to patient at 4:35 PM. Stated was unable to get transportation to come back to the Lab.  Patient stated that he is working on transportation for tomorrow.  Patient was informed that the Clinics would provide him with transportation on tomorrow if needed.  Patient stated he could take a bus.  Informed patient that the Clinics will provide him with a bus ticket to return home if that is what he would like.

## 2024-04-14 NOTE — Progress Notes (Unsigned)
 Call from patient went to both [facility] and [facility] labs to have lab work done.  Was told order was not here for lab work.  Patient was informed tht a new lab would be placed and gave patient the option of returning to [facility] lab or to [facility]Patient to return to [facility] lab today to have the lab work done..  Patient was given number to call if there is a problem getting the order for the lab work done.

## 2024-04-14 NOTE — Telephone Encounter (Signed)
 Call to patient at 3:53 PM Message left that Clinics was calling to see if he had his labs drawn.

## 2024-04-15 ENCOUNTER — Telehealth: Payer: Self-pay | Admitting: *Deleted

## 2024-04-15 ENCOUNTER — Other Ambulatory Visit (INDEPENDENT_AMBULATORY_CARE_PROVIDER_SITE_OTHER)

## 2024-04-15 DIAGNOSIS — E875 Hyperkalemia: Secondary | ICD-10-CM

## 2024-04-15 LAB — BASIC METABOLIC PANEL WITH GFR
Anion gap: 10 (ref 5–15)
BUN: 39 mg/dL — ABNORMAL HIGH (ref 6–20)
CO2: 18 mmol/L — ABNORMAL LOW (ref 22–32)
Calcium: 9.1 mg/dL (ref 8.9–10.3)
Chloride: 111 mmol/L (ref 98–111)
Creatinine, Ser: 1.56 mg/dL — ABNORMAL HIGH (ref 0.61–1.24)
GFR, Estimated: 53 mL/min — ABNORMAL LOW (ref >60)
Glucose, Bld: 115 mg/dL — ABNORMAL HIGH (ref 70–99)
Potassium: 5.1 mmol/L (ref 3.5–5.1)
Sodium: 139 mmol/L (ref 135–145)

## 2024-04-15 NOTE — Telephone Encounter (Signed)
 Message to Dr. Jolaine that patient returned for lab work and results are in the chart.

## 2024-04-16 ENCOUNTER — Ambulatory Visit: Payer: Self-pay | Admitting: Student

## 2024-04-16 ENCOUNTER — Telehealth: Payer: Self-pay | Admitting: *Deleted

## 2024-04-16 DIAGNOSIS — I10 Essential (primary) hypertension: Secondary | ICD-10-CM

## 2024-04-16 DIAGNOSIS — E875 Hyperkalemia: Secondary | ICD-10-CM

## 2024-04-16 DIAGNOSIS — N179 Acute kidney failure, unspecified: Secondary | ICD-10-CM

## 2024-04-16 NOTE — Progress Notes (Signed)
 Called and discussed results with the patient.  Potassium back to normal at 5.1 and renal function back to near baseline with creatinine of 1.5.  Recommend that he continues off of his ARB and comes in for a blood pressure check and BMP in 2 weeks.  Discussed this plan with the patient and he is agreeable.

## 2024-04-16 NOTE — Telephone Encounter (Signed)
 Copied from CRM [id number]. Topic: Clinical - Lab/Test Results >> Apr 16, 2024 12:31 PM Mercer PEDLAR wrote: Reason for CRM: Patient is requesting a callback for lab results.

## 2024-04-16 NOTE — Telephone Encounter (Signed)
 Message to Yellow Team to review recent labs on patient and to determine next steps,.

## 2024-04-24 ENCOUNTER — Other Ambulatory Visit: Payer: Self-pay | Admitting: Internal Medicine

## 2024-04-24 MED ORDER — AMPHETAMINE-DEXTROAMPHET ER 30 MG PO CP24: 30.0000 mg | ORAL_CAPSULE | Freq: Every day | ORAL | 0 refills | Status: DC | PRN

## 2024-04-24 NOTE — Telephone Encounter (Unsigned)
 Copied from CRM [id number]. Topic: Clinical - Medication Refill >> Apr 24, 2024 12:20 PM Leila C wrote: Most Recent Pulmonary Care Visit:  Provider: Dr. Reggy Salt  Department: [facility] Pulmonary Care Date: 01/01/23  Medication(s): amphetamine -dextroamphetamine  (ADDERALL XR) 30 MG 24 hr capsule   Has the patient contacted their pharmacy? Yes, advised to contact the office. (Agent: If no, request that the patient contact the pharmacy for the refill. If patient does not wish to contact the pharmacy document the reason why and proceed with request.) (Agent: If yes, when and what did the pharmacy advise?)  This is the patient's preferred pharmacy:  [phone] [address], [place] - [address]  [place] [address] Phone: [phone] Fax: [phone]  Is this the correct pharmacy for this prescription? Yes If no, delete pharmacy and type the correct one.   Has the prescription been filled recently? Yes  Is the patient out of the medication? Yes  Has the patient been seen for an appointment in the last year OR does the patient have an upcoming appointment? Yes, 09/09/24  Can we respond through MyChart? No, please call [phone]  Agent: Please be advised that Rx refills may take up to 3 business days. We ask that you follow-up with your pharmacy.

## 2024-04-24 NOTE — Telephone Encounter (Signed)
**Note De-identified  [place] Obfuscation** Please advise 

## 2024-04-24 NOTE — Telephone Encounter (Signed)
 Adderall refilled

## 2024-04-30 ENCOUNTER — Telehealth: Payer: Self-pay | Admitting: *Deleted

## 2024-04-30 NOTE — Telephone Encounter (Signed)
 Spoke with Dwayne from [employer].  Stated patient was to have follow up labs within 2 weeks from last draw.  Unable to reach patient to schedule for an earlier appointment.  Message left that the Clinics had called.

## 2024-05-01 ENCOUNTER — Other Ambulatory Visit: Payer: Self-pay

## 2024-05-01 ENCOUNTER — Telehealth: Payer: Self-pay | Admitting: *Deleted

## 2024-05-01 DIAGNOSIS — K219 Gastro-esophageal reflux disease without esophagitis: Secondary | ICD-10-CM

## 2024-05-01 MED ORDER — PANTOPRAZOLE SODIUM 40 MG PO TBEC: 40.0000 mg | DELAYED_RELEASE_TABLET | Freq: Every day | ORAL | 3 refills | Status: DC

## 2024-05-01 NOTE — Telephone Encounter (Signed)
 Medication sent to pharmacy

## 2024-05-01 NOTE — Telephone Encounter (Signed)
 Call to patient to discuss his coming in for repeat labs.  After last lab work Dr. Jolaine wanted patient to return in 2 weeks from 04/16/2024.  Patient is currently scheduled for 05/13/2024.  Patient stated has not had transportation to come.  Will try to get get transportation for tomorrow morning .  Patient will try to come around 8:30 AM and before 11:30 AM on tomorrow.

## 2024-05-07 ENCOUNTER — Telehealth: Payer: Self-pay | Admitting: *Deleted

## 2024-05-07 NOTE — Telephone Encounter (Signed)
 Call to patient to see when he plans to come in for his lab work.  Patient stated that he had forgotten about coming and will try and come tomorrow morning.  Patient was told that the front office and the lab will be informed.

## 2024-05-08 ENCOUNTER — Other Ambulatory Visit

## 2024-05-08 DIAGNOSIS — I10 Essential (primary) hypertension: Secondary | ICD-10-CM | POA: Diagnosis not present

## 2024-05-08 DIAGNOSIS — N179 Acute kidney failure, unspecified: Secondary | ICD-10-CM

## 2024-05-08 DIAGNOSIS — E875 Hyperkalemia: Secondary | ICD-10-CM

## 2024-05-08 LAB — BASIC METABOLIC PANEL WITH GFR
BUN/Creatinine Ratio: 14 (ref 9–20)
BUN: 17 mg/dL (ref 6–24)
CO2: 20 mmol/L (ref 20–29)
Calcium: 8.8 mg/dL (ref 8.7–10.2)
Chloride: 109 mmol/L — ABNORMAL HIGH (ref 96–106)
Creatinine, Ser: 1.22 mg/dL (ref 0.76–1.27)
Glucose: 107 mg/dL — ABNORMAL HIGH (ref 70–99)
Potassium: 4.5 mmol/L (ref 3.5–5.2)
Sodium: 140 mmol/L (ref 134–144)
eGFR: 71 mL/min/1.73 (ref >59)

## 2024-05-08 NOTE — Addendum Note (Signed)
 Addended by: ANTONE DWAYNE SAILOR on: 05/08/2024 10:28 AM   Modules accepted: Orders

## 2024-05-08 NOTE — Progress Notes (Signed)
    [name]. presented today for blood pressure check. Patient is prescribed blood pressure medications and I confirmed that patient did take their blood pressure medication prior to today's appointment. Blood pressure was taken in the usual and appropriate manner using an automated BP cuff.     Vitals:   05/08/24 0919  BP: 134/87      Results of today's visit will be routed to Dr. Jolaine for review and further management.

## 2024-05-09 ENCOUNTER — Telehealth: Payer: Self-pay | Admitting: Student

## 2024-05-09 NOTE — Telephone Encounter (Signed)
See telephone documentation

## 2024-05-12 ENCOUNTER — Ambulatory Visit (INDEPENDENT_AMBULATORY_CARE_PROVIDER_SITE_OTHER): Admitting: Podiatry

## 2024-05-12 ENCOUNTER — Encounter: Payer: Self-pay | Admitting: Podiatry

## 2024-05-12 DIAGNOSIS — E118 Type 2 diabetes mellitus with unspecified complications: Secondary | ICD-10-CM

## 2024-05-12 DIAGNOSIS — B351 Tinea unguium: Secondary | ICD-10-CM

## 2024-05-12 DIAGNOSIS — M79609 Pain in unspecified limb: Secondary | ICD-10-CM | POA: Diagnosis not present

## 2024-05-12 DIAGNOSIS — G608 Other hereditary and idiopathic neuropathies: Secondary | ICD-10-CM

## 2024-05-12 NOTE — Progress Notes (Signed)
 This patient returns to my office for at risk foot care.  This patient requires this care by a professional since this patient will be at risk due to having diabetes  coagulation defect and kidney disease.  Patient is prescribed eliquis .  This patient is unable t  o cut nails himself since the patient cannot reach his nails.These nails are painful walking and wearing shoes.  This patient has not been seen in over one year.This patient presents for at risk foot care today.  General Appearance  Alert, conversant and in no acute stress.  Vascular  Dorsalis pedis and posterior tibial  pulses are palpable  bilaterally.  Capillary return is within normal limits  bilaterally. Temperature is within normal limits  bilaterally.  Neurologic  Senn-Weinstein monofilament wire test within normal limits  bilaterally. Muscle power within normal limits bilaterally.  Nails Thick disfigured discolored nails with subungual debris  from hallux to fifth toes bilaterally. No evidence of bacterial infection or drainage bilaterally.  Orthopedic  No limitations of motion  feet .  No crepitus or effusions noted.  No bony pathology or digital deformities noted.  HAV  right foot.  Pes planus  B/L.  Skin  normotropic skin with no porokeratosis noted bilaterally.  No signs of infections or ulcers noted.     Onychomycosis  Pain in right toes  Pain in left toes  Consent was obtained for treatment procedures.   Mechanical debridement of nails 1-5  bilaterally performed with a nail nipper.  Filed with dremel without incident.    Return office visit    3 months                  Told patient to return for periodic foot care and evaluation due to potential at risk complications.   Ruffin Cotton DPM

## 2024-05-13 ENCOUNTER — Ambulatory Visit

## 2024-05-21 ENCOUNTER — Ambulatory Visit: Admitting: "Endocrinology

## 2024-05-27 ENCOUNTER — Other Ambulatory Visit: Payer: Self-pay | Admitting: Internal Medicine

## 2024-05-27 NOTE — Telephone Encounter (Signed)
**Note De-identified  [place] Obfuscation** Please advise 

## 2024-05-27 NOTE — Telephone Encounter (Signed)
 Copied from CRM [id number]. Topic: Clinical - Medication Refill >> May 27, 2024 10:16 AM Corean SAUNDERS wrote: Medication: amphetamine -dextroamphetamine  (ADDERALL XR) 30 MG 24 hr capsule  Has the patient contacted their pharmacy? Yes, out of refills. (Agent: If no, request that the patient contact the pharmacy for the refill. If patient does not wish to contact the pharmacy document the reason why and proceed with request.) (Agent: If yes, when and what did the pharmacy advise?)  This is the patient's preferred pharmacy:  [phone] [address], [place] - [address] [place] [address] Phone: [phone] Fax: [phone]  Is this the correct pharmacy for this prescription? Yes If no, delete pharmacy and type the correct one.   Has the prescription been filled recently? Yes  Is the patient out of the medication? Yes  Has the patient been seen for an appointment in the last year OR does the patient have an upcoming appointment? Yes  Can we respond through MyChart? No  Agent: Please be advised that Rx refills may take up to 3 business days. We ask that you follow-up with your pharmacy.

## 2024-05-29 ENCOUNTER — Telehealth (HOSPITAL_BASED_OUTPATIENT_CLINIC_OR_DEPARTMENT_OTHER): Payer: Self-pay

## 2024-05-29 DIAGNOSIS — G47411 Narcolepsy with cataplexy: Secondary | ICD-10-CM

## 2024-05-29 MED ORDER — AMPHETAMINE-DEXTROAMPHET ER 30 MG PO CP24
30.0000 mg | ORAL_CAPSULE | Freq: Every day | ORAL | 0 refills | Status: DC | PRN
Start: 2024-05-29 — End: 2024-06-26

## 2024-05-29 NOTE — Telephone Encounter (Signed)
 Last OV 02/2024. Advised to continue by Dr. Neysa. Last refill 7/10 on PDMP. Rx refilled today. Ensure pt is aware to notify of any side effects or changes in behavior. Keep follow up. Future refills will need to go to Dr. Neysa. Thanks.

## 2024-05-29 NOTE — Telephone Encounter (Signed)
 Copied from CRM [id number]. Topic: Clinical - Medication Refill >> May 27, 2024 10:16 AM Corean SAUNDERS wrote: Medication: amphetamine -dextroamphetamine  (ADDERALL XR) 30 MG 24 hr capsule  Has the patient contacted their pharmacy? Yes, out of refills. (Agent: If no, request that the patient contact the pharmacy for the refill. If patient does not wish to contact the pharmacy document the reason why and proceed with request.) (Agent: If yes, when and what did the pharmacy advise?)  This is the patient's preferred pharmacy:  [phone] [address],  - [address] [place] [address] Phone: [phone] Fax: [phone]  Is this the correct pharmacy for this prescription? Yes If no, delete pharmacy and type the correct one.   Has the prescription been filled recently? Yes  Is the patient out of the medication? Yes  Has the patient been seen for an appointment in the last year OR does the patient have an upcoming appointment? Yes  Can we respond through MyChart? No  Agent: Please be advised that Rx refills may take up to 3 business days. We ask that you follow-up with your pharmacy. >> May 29, 2024 12:23 PM Benton O wrote: Patient is calling back concerning his refiill request did advise patient of the 3 business days . Can someone please give patient a call concerning this refill request

## 2024-05-29 NOTE — Telephone Encounter (Signed)
 Unable to notify pt of refill being sent as no answer or VM

## 2024-05-30 NOTE — Telephone Encounter (Signed)
Pt notified on VM. 

## 2024-06-02 ENCOUNTER — Other Ambulatory Visit: Payer: Self-pay

## 2024-06-03 MED ORDER — HYDRALAZINE HCL 25 MG PO TABS: 25.0000 mg | ORAL_TABLET | Freq: Two times a day (BID) | ORAL | 3 refills | Status: DC

## 2024-06-23 ENCOUNTER — Ambulatory Visit: Admitting: "Endocrinology

## 2024-06-25 ENCOUNTER — Telehealth: Payer: Self-pay | Admitting: Internal Medicine

## 2024-06-25 DIAGNOSIS — G47411 Narcolepsy with cataplexy: Secondary | ICD-10-CM

## 2024-06-25 NOTE — Telephone Encounter (Signed)
 Copied from CRM #[id number]. Topic: Clinical - Medication Refill >> Jun 25, 2024 11:15 AM Lavanda D wrote: Medication: amphetamine -dextroamphetamine  (ADDERALL XR) 30 MG 24 hr capsule [[id number]]  Has the patient contacted their pharmacy? No, previously told to reach to clinic  (Agent: If no, request that the patient contact the pharmacy for the refill. If patient does not wish to contact the pharmacy document the reason why and proceed with request.) (Agent: If yes, when and what did the pharmacy advise?)  This is the patient's preferred pharmacy:  [phone] [address], [place] - [address] [place] [address] Phone: [phone] Fax: [phone]  Is this the correct pharmacy for this prescription? Yes If no, delete pharmacy and type the correct one.   Has the prescription been filled recently? No  Is the patient out of the medication? No, 3 days left  Has the patient been seen for an appointment in the last year OR does the patient have an upcoming appointment? Yes  Can we respond through MyChart? No  Agent: Please be advised that Rx refills may take up to 3 business days. We ask that you follow-up with your pharmacy.

## 2024-06-25 NOTE — Telephone Encounter (Signed)
 Patient requesting adderrall refill last seen 5/27 last refill 8/14

## 2024-06-26 MED ORDER — AMPHETAMINE-DEXTROAMPHET ER 30 MG PO CP24: 30.0000 mg | ORAL_CAPSULE | Freq: Every day | ORAL | 0 refills | Status: DC | PRN

## 2024-06-26 NOTE — Telephone Encounter (Signed)
 Adderall  refilled

## 2024-07-10 ENCOUNTER — Encounter: Payer: Self-pay | Admitting: "Endocrinology

## 2024-07-10 ENCOUNTER — Ambulatory Visit (INDEPENDENT_AMBULATORY_CARE_PROVIDER_SITE_OTHER): Admitting: "Endocrinology

## 2024-07-10 VITALS — BP 100/80 | HR 74 | Ht 69.0 in | Wt 286.0 lb

## 2024-07-10 DIAGNOSIS — Z7985 Long-term (current) use of injectable non-insulin antidiabetic drugs: Secondary | ICD-10-CM | POA: Diagnosis not present

## 2024-07-10 DIAGNOSIS — Z7984 Long term (current) use of oral hypoglycemic drugs: Secondary | ICD-10-CM

## 2024-07-10 DIAGNOSIS — E1165 Type 2 diabetes mellitus with hyperglycemia: Secondary | ICD-10-CM | POA: Diagnosis not present

## 2024-07-10 DIAGNOSIS — E7800 Pure hypercholesterolemia, unspecified: Secondary | ICD-10-CM | POA: Diagnosis not present

## 2024-07-10 LAB — POCT GLYCOSYLATED HEMOGLOBIN (HGB A1C): Hemoglobin A1C: 6.8 % — AB (ref 4.0–5.6)

## 2024-07-10 MED ORDER — BLOOD GLUCOSE TEST VI STRP: 1.0000 | ORAL_STRIP | Freq: Three times a day (TID) | 3 refills | Status: AC

## 2024-07-10 MED ORDER — MOUNJARO 7.5 MG/0.5ML ~~LOC~~ SOAJ: 7.5000 mg | SUBCUTANEOUS | 1 refills | Status: DC

## 2024-07-10 MED ORDER — BLOOD GLUCOSE MONITORING SUPPL DEVI: 1.0000 | Freq: Three times a day (TID) | 0 refills | Status: AC

## 2024-07-10 MED ORDER — LANCETS MISC. MISC: 1.0000 | Freq: Three times a day (TID) | 3 refills | Status: AC

## 2024-07-10 MED ORDER — LANCET DEVICE MISC: 1.0000 | Freq: Three times a day (TID) | 0 refills | Status: AC

## 2024-07-10 NOTE — Progress Notes (Signed)
 Outpatient Endocrinology Note Douglas Birmingham, [name]  07/10/24   [name] [birth year] [id number]  Referring Provider: Jolaine Pac, DO Primary Care Provider: Jolaine Pac, DO Reason for consultation: Subjective   Assessment & Plan  Diagnoses and all orders for this visit:  Type 2 diabetes mellitus with hyperglycemia, unspecified whether long term insulin  use (HCC) -     POCT glycosylated hemoglobin (Hb A1C) -     Blood Glucose Monitoring Suppl DEVI; 1 each by Does not apply route in the morning, at noon, and at bedtime. May substitute to any manufacturer covered by patient's insurance. -     Glucose Blood (BLOOD GLUCOSE TEST STRIPS) STRP; 1 each by In Vitro route in the morning, at noon, and at bedtime. May substitute to any manufacturer covered by patient's insurance. -     Lancet Device MISC; 1 each by Does not apply route in the morning, at noon, and at bedtime. May substitute to any manufacturer covered by patient's insurance. -     Lancets Misc. MISC; 1 each by Does not apply route in the morning, at noon, and at bedtime. May substitute to any manufacturer covered by patient's insurance. -     tirzepatide  (MOUNJARO ) 7.5 MG/0.5ML Pen; Inject 7.5 mg into the skin once a week.  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,  Lab Results  Component Value Date   GFR 49.26 (L) 04/16/2023   Hba1c goal less than 7, current Hba1c is  Lab Results  Component Value Date   HGBA1C 6.8 (A) 07/10/2024   Will recommend the following: Metformin  1000 mg bid Jardiance  25 mg every day  Mounjaro  7.5 mg/week  No known contraindications/side effects to any of above medications No history of MEN syndrome/medullary thyroid  cancer/pancreatitis or pancreatic cancer in self or family  -Last LD and Tg are as  follows: Lab Results  Component Value Date   LDLCALC 51 12/17/2023    Lab Results  Component Value Date   TRIG 142 12/17/2023   -On atorvastatin  80 mg QD -Follow low fat diet and exercise   -Blood pressure goal <140/90 - Microalbumin/creatinine goal is < 30 -Last MA/Cr is as follows: No results found for: MICROALBUR, MALB24HUR  -on ACE/ARB olmesartan  20 mg every day  -diet changes including salt restriction -limit eating outside -counseled BP targets per standards of diabetes care -uncontrolled blood pressure can lead to retinopathy, nephropathy and cardiovascular and atherosclerotic heart disease  Reviewed and counseled on: -A1C target -Blood sugar targets -Complications of uncontrolled diabetes  -Checking blood sugar before meals and bedtime and bring log next visit -All medications with mechanism of action and side effects -Hypoglycemia management: rule of 15's, Glucagon Emergency Kit and medical alert ID -low-carb low-fat plate-method diet -At least 20 minutes of physical activity per day -Annual dilated retinal eye exam and foot exam -compliance and follow up needs -follow up as scheduled or earlier if problem gets worse  Call if blood sugar is less than 70 or consistently above 250    Take a 15 gm snack of carbohydrate at bedtime before you go to sleep if your blood sugar is less than 100.    If you are going to fast after midnight for a test or procedure, ask your physician for instructions on how to reduce/decrease your insulin  dose.    Call if blood sugar is  less than 70 or consistently above 250  -Treating a low sugar by rule of 15  (15 gms of sugar every 15 min until sugar is more than 70) If you feel your sugar is low, test your sugar to be sure If your sugar is low (less than 70), then take 15 grams of a fast acting Carbohydrate (3-4 glucose tablets or glucose gel or 4 ounces of juice or regular soda) Recheck your sugar 15 min after treating low to make  sure it is more than 70 If sugar is still less than 70, treat again with 15 grams of carbohydrate          Don't drive the hour of hypoglycemia  If unconscious/unable to eat or drink by mouth, use glucagon injection or nasal spray baqsimi and call 911. Can repeat again in 15 min if still unconscious.  Return in about 3 months (around 10/09/2024).   I have reviewed current medications, nurse's notes, allergies, vital signs, past medical and surgical history, family medical history, and social history for this encounter. Counseled patient on symptoms, examination findings, lab findings, imaging results, treatment decisions and monitoring and prognosis. The patient understood the recommendations and agrees with the treatment plan. All questions regarding treatment plan were fully answered.  Douglas Birmingham, [name]  07/10/24    History of Present Illness Douglas Bulthuis. is a 53 y.o. year old male who presents for follow up of Type II diabetes mellitus.  Douglas Velardi. was first diagnosed at age 64.   Diabetes education +  Home diabetes regimen: Metformin  1000 mg bid Jardiance  25 mg every day  Mounjaro  5 mg /week-well tolerated   COMPLICATIONS +  MI/Stroke -  retinopathy -  neuropathy + nephropathy  SYMPTOMS REVIEWED + Polyuria - Weight loss - Blurred vision  BLOOD SUGAR DATA Doesn't check BG  Physical Exam  BP 100/80   Pulse 74   Ht 5' 9 (1.753 m)   Wt 286 lb (129.7 kg)   SpO2 98%   BMI 42.23 kg/m    Constitutional: well developed, well nourished Head: normocephalic, atraumatic Eyes: sclera anicteric, no redness Neck: supple Lungs: normal respiratory effort Neurology: alert and oriented Skin: dry, no appreciable rashes Musculoskeletal: no appreciable defects Psychiatric: normal mood and affect Diabetic Foot Exam - Simple   No data filed      Current Medications Patient's Medications  New Prescriptions   BLOOD GLUCOSE MONITORING SUPPL DEVI     1 each by Does not apply route in the morning, at noon, and at bedtime. May substitute to any manufacturer covered by patient's insurance.   GLUCOSE BLOOD (BLOOD GLUCOSE TEST STRIPS) STRP    1 each by In Vitro route in the morning, at noon, and at bedtime. May substitute to any manufacturer covered by patient's insurance.   LANCET DEVICE MISC    1 each by Does not apply route in the morning, at noon, and at bedtime. May substitute to any manufacturer covered by patient's insurance.   LANCETS MISC. MISC    1 each by Does not apply route in the morning, at noon, and at bedtime. May substitute to any manufacturer covered by patient's insurance.  Previous Medications   ACCU-CHEK SOFTCLIX LANCETS LANCETS    TEST UP TO 4 TIMES A DAY   ACETAMINOPHEN  (TYLENOL ) 325 MG TABLET    Take 2 tablets (650 mg total) by mouth every 6 (six) hours as needed for mild pain (or Fever >/= 101).   AMLODIPINE  (  NORVASC ) 10 MG TABLET    TAKE 1 TABLET BY MOUTH EVERY DAY   AMPHETAMINE -DEXTROAMPHETAMINE  (ADDERALL XR) 30 MG 24 HR CAPSULE    Take 1 capsule (30 mg total) by mouth daily as needed (focus). 1 daily as needed   APIXABAN  (ELIQUIS ) 5 MG TABS TABLET    Take 1 tablet (5 mg total) by mouth 2 (two) times daily.   ATORVASTATIN  (LIPITOR ) 80 MG TABLET    Take 1 tablet (80 mg total) by mouth daily.   BLOOD GLUCOSE MONITORING SUPPL DEVI    1 each by Does not apply route in the morning, at noon, and at bedtime. May substitute to any manufacturer covered by patient's insurance.   CARVEDILOL  (COREG ) 6.25 MG TABLET    Take 1 tablet (6.25 mg total) by mouth 2 (two) times daily with a meal.   CETIRIZINE  (ZYRTEC ) 10 MG TABLET    TAKE 1 TABLET BY MOUTH EVERY DAY   EMPAGLIFLOZIN  (JARDIANCE ) 25 MG TABS TABLET    Take 1 tablet (25 mg total) by mouth daily.   GLUCOSE BLOOD (ACCU-CHEK GUIDE) TEST STRIP    Please use strips to test morning fasting sugars and one hour after each meal, total 4x daily   HYDRALAZINE  (APRESOLINE ) 25 MG TABLET     Take 1 tablet (25 mg total) by mouth 2 (two) times daily.   METFORMIN  (GLUCOPHAGE ) 1000 MG TABLET    Take 1 tablet (1,000 mg total) by mouth 2 (two) times daily with a meal.   OLMESARTAN  (BENICAR ) 20 MG TABLET    TAKE 1 TABLET BY MOUTH EVERY DAY   PANTOPRAZOLE  (PROTONIX ) 40 MG TABLET    Take 1 tablet (40 mg total) by mouth daily.   TADALAFIL  (CIALIS ) 10 MG TABLET    Take 1 tablet (10 mg total) by mouth daily.  Modified Medications   Modified Medication Previous Medication   TIRZEPATIDE  (MOUNJARO ) 7.5 MG/0.5ML PEN tirzepatide  (MOUNJARO ) 7.5 MG/0.5ML Pen      Inject 7.5 mg into the skin once a week.    Inject 7.5 mg into the skin once a week.  Discontinued Medications   No medications on file    Allergies Allergies  Allergen Reactions   Shrimp [Shellfish Allergy] Anaphylaxis   Banana Other (See Comments)    Pt gags   Watermelon Flavoring Agent (Non-Screening) Nausea And Vomiting   Almond Oil Itching    Roof of mouth itches   Other Itching    Grapes cause itching   Peanut-Containing Drug Products Itching and Cough   Sulfa Antibiotics Itching    Past Medical History Past Medical History:  Diagnosis Date   Diabetes mellitus    GERD (gastroesophageal reflux disease)    History of alcohol abuse    History of cocaine use    History of echocardiogram    a. Echo 4/14: Moderate LVH, vigorous LVEF, EF 65-70%, normal wall motion, grade 2 diastolic dysfunction, mildly dilated aortic root and ascending aorta, ascending aorta 40 mm, aortic root 38 mm, mild LAE   History of pancreatitis 01/22/2019   Hx of cardiovascular stress test    a. GXT 5/14: No ischemic changes  //  b. ETT-Myoview 3/16:  Low risk, no ischemia, EF 58%   Hyperlipidemia    Hypertension    Morbid obesity (HCC)    Paroxysmal atrial fibrillation (HCC)    Occurring in 2008, with several recurrence since then (including in the setting of + cocaine on UDS).   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/2019    Past Surgical History Past Surgical History:  Procedure Laterality Date   APPENDECTOMY     ATRIAL FIBRILLATION ABLATION N/A 11/27/2019   Procedure: ATRIAL FIBRILLATION ABLATION;  Surgeon: Kelsie Agent, [name];  Location: [facility] INVASIVE CV LAB;  Service: Cardiovascular;  Laterality: N/A;   BUBBLE STUDY  05/20/2021   Procedure: BUBBLE STUDY;  Surgeon: Kate Lonni CROME, [name];  Location: [facility] ENDOSCOPY;  Service: Cardiovascular;;   ROTATOR CUFF REPAIR     SVT ABLATION N/A 11/27/2019   Procedure: SVT ABLATION;  Surgeon: Kelsie Agent, [name];  Location: [facility] INVASIVE CV LAB;  Service: Cardiovascular;  Laterality: N/A;   TEE WITHOUT CARDIOVERSION N/A 05/20/2021   Procedure: TRANSESOPHAGEAL ECHOCARDIOGRAM (TEE);  Surgeon: Kate Lonni CROME, [name];  Location[facility] ENDOSCOPY;  Service: Cardiovascular;  Laterality: N/A;   TONSILLECTOMY      Family History family history includes Diabetes in his mother and paternal grandfather; Heart attack (age of onset: 17) in his father; Stroke in his maternal grandmother and paternal grandmother.  Social History Social History   Socioeconomic History   Marital status: Single    Spouse name: Not on file   Number of children: 0   Years of education: 12   Highest education level: Not on file  Occupational History   Occupation: Unemployed    Employer: UNEMPLOYED  Tobacco Use   Smoking status: Former    Current packs/day: 0.00    Average packs/day: 0.5 packs/day for 28.0 years (14.0 ttl pk-yrs)    Types: Cigarettes    Start date: 10/15/1994    Quit date: 10/15/2022    Years since quitting: 1.7   Smokeless tobacco: Former    Types: Snuff   Tobacco comments:    1 pack per week HEI 06/09/22  Vaping Use   Vaping status: Never Used  Substance and Sexual Activity   Alcohol use: Yes    Alcohol/week: 6.0 standard drinks of alcohol    Types: 6 Standard drinks or equivalent per week    Comment: rarely    Drug use: Not Currently    Comment: cocaine in the past, none currently   Sexual activity: Not on file  Other Topics Concern   Not on file  Social History Narrative   Fun: Photographer       Lives in [place] with sister.      Unemployed, was working a Office manager job   Social Drivers of Corporate investment banker Strain: Medium Risk (04/08/2024)   Overall Financial Resource Strain (CARDIA)    Difficulty of Paying Living Expenses: Somewhat hard  Food Insecurity: No Food Insecurity (04/08/2024)   Hunger Vital Sign    Worried About Running Out of Food in the Last Year: Never true    Ran Out of Food in the Last Year: Never true  Transportation Needs: Unmet Transportation Needs (02/02/2023)   PRAPARE - Administrator, Civil Service (Medical): Yes    Lack of Transportation (Non-Medical): No  Physical Activity: Inactive (12/11/2022)   Exercise Vital Sign    Days of Exercise per Week: 0 days    Minutes of Exercise per Session: 0 min  Stress: No Stress Concern Present (04/08/2024)   [employer] of Occupational Health - Occupational Stress Questionnaire    Feeling of Stress: Not at all  Social Connections: Socially Isolated (04/08/2024)   Social Connection and Isolation Panel  Frequency of Communication with Friends and Family: More than three times a week    Frequency of Social Gatherings with Friends and Family: Once a week    Attends Religious Services: Never    Database administrator or Organizations: No    Attends Banker Meetings: Never    Marital Status: Never married  Intimate Partner Violence: Not At Risk (04/08/2024)   Humiliation, Afraid, Rape, and Kick questionnaire    Fear of Current or Ex-Partner: No    Emotionally Abused: No    Physically Abused: No    Sexually Abused: No    Lab Results  Component Value Date   HGBA1C 6.8 (A) 07/10/2024   HGBA1C 6.5 (A) 04/08/2024   HGBA1C 6.4 (A) 01/10/2024   Lab Results  Component Value Date    CHOL 113 12/17/2023   Lab Results  Component Value Date   HDL 37 (L) 12/17/2023   Lab Results  Component Value Date   LDLCALC 51 12/17/2023   Lab Results  Component Value Date   TRIG 142 12/17/2023   Lab Results  Component Value Date   CHOLHDL 3.1 12/17/2023   Lab Results  Component Value Date   CREATININE 1.22 05/08/2024   Lab Results  Component Value Date   GFR 49.26 (L) 04/16/2023   No results found for: MACKEY CURRENT     Component Value Date/Time   NA 140 05/08/2024 0910   K 4.5 05/08/2024 0910   CL 109 (H) 05/08/2024 0910   CO2 20 05/08/2024 0910   GLUCOSE 107 (H) 05/08/2024 0910   GLUCOSE 115 (H) 04/15/2024 0842   BUN 17 05/08/2024 0910   CREATININE 1.22 05/08/2024 0910   CREATININE 1.13 04/07/2016 0851   CALCIUM  8.8 05/08/2024 0910   PROT 7.4 12/17/2023 0841   ALBUMIN 4.0 12/17/2023 0841   AST 31 12/17/2023 0841   ALT 62 (H) 12/17/2023 0841   ALKPHOS 95 12/17/2023 0841   BILITOT 0.4 12/17/2023 0841   GFRNONAA 53 (L) 04/15/2024 0842   GFRAA 74 11/24/2020 1406      Latest Ref Rng & Units 05/08/2024    9:10 AM 04/15/2024    8:42 AM 04/11/2024   10:00 AM  BMP  Glucose 70 - 99 mg/dL 892  884  891   BUN 6 - 24 mg/dL 17  39  37   Creatinine 0.76 - 1.27 mg/dL 8.77  8.43  7.93   BUN/Creat Ratio 9 - 20 14   18    Sodium 134 - 144 mmol/L 140  139  139   Potassium 3.5 - 5.2 mmol/L 4.5  5.1  5.8   Chloride 96 - 106 mmol/L 109  111  109   CO2 20 - 29 mmol/L 20  18  18    Calcium  8.7 - 10.2 mg/dL 8.8  9.1  9.2        Component Value Date/Time   WBC 7.2 02/02/2023 0508   RBC 4.91 02/02/2023 0508   HGB 13.5 02/02/2023 0508   HGB 14.8 01/30/2023 1017   HCT 42.0 02/02/2023 0508   HCT 43.8 01/30/2023 1017   PLT 287 02/02/2023 0508   PLT 311 01/30/2023 1017   MCV 85.5 02/02/2023 0508   MCV 83 01/30/2023 1017   MCH 27.5 02/02/2023 0508   MCHC 32.1 02/02/2023 0508   RDW 13.5 02/02/2023 0508   RDW 13.8 01/30/2023 1017   LYMPHSABS 1.0 02/01/2023  1043   MONOABS 0.8 02/01/2023 1043   EOSABS 0.4 02/01/2023 1043  BASOSABS 0.1 02/01/2023 1043     Parts of this note may have been dictated using voice recognition software. There may be variances in spelling and vocabulary which are unintentional. Not all errors are proofread. Please notify the dino if any discrepancies are noted or if the meaning of any statement is not clear.

## 2024-07-21 NOTE — Telephone Encounter (Signed)
 Spoke with the pt:  Appt Sch:   Name: Douglas, Walsh. MRN: [id number]  Date: 08/08/2024 Status: Sch  Time: 9:15 AM Length: 30  Visit Type: OFFICE VISIT [8002] Copay: $0.00  Provider: Waymond Cart, MD       Copied from CRM [id number]. Topic: A Ppointments - Appointment Cancel/Reschedule >> Jul 21, 2024 11:40 AM Miquel SAILOR wrote: Patient/patient representative is calling to cancel or reschedule an appointment. Refer to attachments for appointment information.   Patient had app for 10/08 but has to Cancell due to ride situation. Tried reschedule but no dates available tried PCP /group Pt only wasn't PCP or group only. Needs call back [phone]

## 2024-07-23 ENCOUNTER — Ambulatory Visit: Admitting: Student

## 2024-07-29 ENCOUNTER — Other Ambulatory Visit: Payer: Self-pay | Admitting: Internal Medicine

## 2024-07-29 DIAGNOSIS — G47411 Narcolepsy with cataplexy: Secondary | ICD-10-CM

## 2024-07-29 MED ORDER — AMPHETAMINE-DEXTROAMPHET ER 30 MG PO CP24: 30.0000 mg | ORAL_CAPSULE | Freq: Every day | ORAL | 0 refills | Status: DC | PRN

## 2024-07-29 NOTE — Telephone Encounter (Signed)
 Adderall  refilled

## 2024-07-29 NOTE — Telephone Encounter (Unsigned)
 Copied from CRM #[id number]. Topic: Clinical - Medication Refill >> Jul 29, 2024  2:22 PM Joesph PARAS wrote: Medication: amphetamine -dextroamphetamine  (ADDERALL XR) 30 MG 24 hr capsule  Has the patient contacted their pharmacy? Yes - Out of Refiils  This is the patient's preferred pharmacy:  [phone] [address] [place] - [address] [place] [address] Phone: [phone] Fax: [phone]  Is this the correct pharmacy for this prescription? Yes If no, delete pharmacy and type the correct one.   Has the prescription been filled recently? No  Is the patient out of the medication? Yes  Has the patient been seen for an appointment in the last year OR does the patient have an upcoming appointment? Yes  Can we respond through MyChart? No  Agent: Please be advised that Rx refills may take up to 3 business days. We ask that you follow-up with your pharmacy.

## 2024-07-29 NOTE — Telephone Encounter (Signed)
 Pt requesting refill of controlled substance - please advise, thank you!  LOV: 03/11/24 Last Fill: 06/26/24

## 2024-08-04 ENCOUNTER — Other Ambulatory Visit: Payer: Self-pay | Admitting: "Endocrinology

## 2024-08-04 ENCOUNTER — Other Ambulatory Visit: Payer: Self-pay | Admitting: Student

## 2024-08-04 DIAGNOSIS — E1165 Type 2 diabetes mellitus with hyperglycemia: Secondary | ICD-10-CM

## 2024-08-04 NOTE — Telephone Encounter (Signed)
 Refill request complete

## 2024-08-04 NOTE — Telephone Encounter (Signed)
 Medication sent to pharmacy

## 2024-08-08 ENCOUNTER — Ambulatory Visit: Admitting: Student

## 2024-08-09 ENCOUNTER — Other Ambulatory Visit: Payer: Self-pay | Admitting: "Endocrinology

## 2024-08-09 DIAGNOSIS — E1165 Type 2 diabetes mellitus with hyperglycemia: Secondary | ICD-10-CM

## 2024-08-11 ENCOUNTER — Telehealth: Payer: Self-pay | Admitting: Dietician

## 2024-08-11 NOTE — Telephone Encounter (Signed)
 Called patient about diabetes eye exam. He states he has not been to an eye doctor in the past year and that he will call and schedule an appointment.

## 2024-09-01 ENCOUNTER — Telehealth: Payer: Self-pay | Admitting: Internal Medicine

## 2024-09-01 DIAGNOSIS — G47411 Narcolepsy with cataplexy: Secondary | ICD-10-CM

## 2024-09-01 NOTE — Telephone Encounter (Unsigned)
 Copied from CRM #[id number]. Topic: Clinical - Medication Refill >> Sep 01, 2024 12:53 PM Russell PARAS wrote: Medication: amphetamine -dextroamphetamine  (ADDERALL XR) 30 MG 24 hr capsule  Has the patient contacted their pharmacy? Yes (Agent: If no, request that the patient contact the pharmacy for the refill. If patient does not wish to contact the pharmacy document the reason why and proceed with request.) (Agent: If yes, when and what did the pharmacy advise?)  This is the patient's preferred pharmacy:  [phone] [address], [place] - [address] [place] [address] Phone: [phone] Fax: [phone]  Is this the correct pharmacy for this prescription? Yes If no, delete pharmacy and type the correct one.   Has the prescription been filled recently? Yes  Is the patient out of the medication? No  Has the patient been seen for an appointment in the last year OR does the patient have an upcoming appointment? Yes, 10/14 w/Young  Can we respond through MyChart? Yes  Agent: Please be advised that Rx refills may take up to 3 business days. We ask that you follow-up with your pharmacy.

## 2024-09-02 ENCOUNTER — Other Ambulatory Visit: Payer: Self-pay | Admitting: Student

## 2024-09-02 DIAGNOSIS — K219 Gastro-esophageal reflux disease without esophagitis: Secondary | ICD-10-CM

## 2024-09-02 MED ORDER — AMPHETAMINE-DEXTROAMPHET ER 30 MG PO CP24: 30.0000 mg | ORAL_CAPSULE | Freq: Every day | ORAL | 0 refills | Status: DC | PRN

## 2024-09-02 NOTE — Telephone Encounter (Signed)
 Dr. Neysa please advise pt is requesting Adderall.

## 2024-09-02 NOTE — Telephone Encounter (Signed)
 Medication sent to pharmacy

## 2024-09-02 NOTE — Telephone Encounter (Signed)
 Adderall  refilled

## 2024-09-07 NOTE — Progress Notes (Deleted)
 HPI  male smoker followed for management of OSA and narcolepsy with cataplexy, complicated by allergic rhinitis, atrial fibrillation, dCHF, DM, HTN NPSG 11/23/00- AHI 20 per hour. Hypnagogic hallucination associated with dreaming. 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 Vivid dreams as soon as he falls asleep Multiple Sleep Latency Test 10/30/2012-pathologic daytime hypersomnia, nonspecific, compatible with idiopathic hypersomnia or narcolepsy. Mean latency 0.9 minutes with one sleep onset REM event CT chest 12/05/19- mediastinal and bilateral adenopathy, interstitial prominence, ASCVD NPSG 05/03/21- AHI 29.6/ hr, desaturation to 81%, CPAP to 16 (O2 sat on CPAP 16 was 94%), body weight  306 lbs   Suggest CPAP auto 10-20  =============================================================   03/11/24-  53 year old male Smoker (14 pkyrs)  followed for management of OSA and Narcolepsy with cataplexy,, Mediastinal / Hilar Adenopathy, ILD,  complicated by allergic rhinitis, Atrial Fibrillation/Eliquis  flecainide / Xarelto , dCHF, Diastolic Dysfunction, CAD, CVA, DM2, HTN, Pancreatitis, Obesity, Tobacco use,  Covid vax- 3 Phizer - Adderall XR 30 mg, 1 daily CPAP auto 10-20/ [facility]            AirSense 10 AutoSet Download-compliance 83%, AHI 7.2/hr Body weight today-290 lbs -----Sleeping well.  Patient forgets to turn CPAP off when he gets up to go to bathroom.  Patient has frequent awakenings during the night on a regular basis. Download reviewed. Machine is about 53 yrs old.  Adderall works well. No cataplexy. Occasional naps.  Bothersome nasal congestion. We discussed consistent use of Flonase  first. Avoid decongestant sprays. He blames his baseboard heat.  09/09/24-53 year old male Smoker (14 pkyrs)  followed for management of OSA and Narcolepsy with cataplexy,, Mediastinal / Hilar Adenopathy, ILD,  complicated by allergic rhinitis, Atrial Fibrillation/Eliquis  flecainide / Xarelto , dCHF, Diastolic  Dysfunction, CAD, CVA, DM2, HTN, Pancreatitis, Obesity, Tobacco use,  Covid vax- 3 Phizer - Adderall XR 30 mg, 1 daily CPAP auto 10-20/ [facility]            AirSense 10 AutoSet Download-compliance  Body weight today-    ROS-see HPI  + = positive Constitutional:   No-   weight loss, night sweats, fevers, +chills,  +fatigue, lassitude. HEENT:   No-  headaches, difficulty swallowing, tooth/dental problems, sore throat,       No-  sneezing, itching, ear ache, + nasal congestion, post nasal drip,  CV:  No-   chest pain, orthopnea, PND, swelling in lower extremities, anasarca, dizziness, palpitations Resp: + shortness of breath with exertion or at rest.              No-   productive cough,  No non-productive cough,  No- coughing up of blood.              No-   change in color of mucus.  No- wheezing.   Skin: No-   rash or lesions. GI:  No-   heartburn, indigestion, abdominal pain, nausea, vomiting,  GU:  MS:  No-   joint pain or swelling.  Neuro-     +HPI Psych:  No- change in mood or affect. No depression or anxiety.  No memory loss.  OBJ- Physical Exam   General- Alert/ calm, Oriented, Affect-appropriate, Distress- none acute, +morbidly obese,   Skin- rash-none, lesions- none, excoriation- none Lymphadenopathy- none Head- atraumatic            Eyes- Gross vision intact, PERRLA, conjunctivae and secretions clear            Ears- Hearing, canals-normal            Nose- rhinitis/ turbinate +edema/  stuffy, no-Septal dev, mucus, polyps, erosion, perforation             Throat- Mallampati III , mucosa clear , drainage- none, tonsils- atrophic Neck- flexible , trachea midline, no stridor , thyroid  nl, carotid no bruit Chest - symmetrical excursion , unlabored           Heart/CV- RRR today, no murmur , no gallop  , no rub, nl s1 s2                           - JVD- none , edema- none, stasis changes- none, varices- none           Lung- clear to P&A, wheeze- none, cough- none , dullness-none, rub-  none           Chest wall-  Abd-  Br/ Gen/ Rectal- Not done, not indicated Extrem- no edema Neuro- alert, nonfocal

## 2024-09-09 ENCOUNTER — Ambulatory Visit: Admitting: Internal Medicine

## 2024-09-09 ENCOUNTER — Ambulatory Visit: Payer: Self-pay | Admitting: Student

## 2024-09-09 VITALS — BP 128/79 | HR 81 | Temp 97.6°F | Ht 69.0 in | Wt 279.6 lb

## 2024-09-09 DIAGNOSIS — I10 Essential (primary) hypertension: Secondary | ICD-10-CM | POA: Diagnosis not present

## 2024-09-09 DIAGNOSIS — E7849 Other hyperlipidemia: Secondary | ICD-10-CM

## 2024-09-09 DIAGNOSIS — Z0000 Encounter for general adult medical examination without abnormal findings: Secondary | ICD-10-CM

## 2024-09-09 DIAGNOSIS — E118 Type 2 diabetes mellitus with unspecified complications: Secondary | ICD-10-CM

## 2024-09-09 DIAGNOSIS — Z87891 Personal history of nicotine dependence: Secondary | ICD-10-CM

## 2024-09-09 DIAGNOSIS — Z1211 Encounter for screening for malignant neoplasm of colon: Secondary | ICD-10-CM

## 2024-09-09 DIAGNOSIS — E1169 Type 2 diabetes mellitus with other specified complication: Secondary | ICD-10-CM

## 2024-09-09 DIAGNOSIS — Z833 Family history of diabetes mellitus: Secondary | ICD-10-CM

## 2024-09-09 DIAGNOSIS — E1165 Type 2 diabetes mellitus with hyperglycemia: Secondary | ICD-10-CM | POA: Diagnosis not present

## 2024-09-09 DIAGNOSIS — Z7984 Long term (current) use of oral hypoglycemic drugs: Secondary | ICD-10-CM

## 2024-09-09 DIAGNOSIS — E875 Hyperkalemia: Secondary | ICD-10-CM | POA: Diagnosis not present

## 2024-09-09 DIAGNOSIS — Z7985 Long-term (current) use of injectable non-insulin antidiabetic drugs: Secondary | ICD-10-CM

## 2024-09-09 DIAGNOSIS — Z79899 Other long term (current) drug therapy: Secondary | ICD-10-CM

## 2024-09-09 MED ORDER — MOUNJARO 10 MG/0.5ML ~~LOC~~ SOAJ: 10.0000 mg | SUBCUTANEOUS | 3 refills | Status: AC

## 2024-09-09 NOTE — Progress Notes (Signed)
 CC: Follow-up  HPI:  [name]. is a 53 y.o. male living with a history stated below and presents today for follow-up. Please see problem based assessment and plan for additional details.  Past Medical History:  Diagnosis Date   Diabetes mellitus    GERD (gastroesophageal reflux disease)    History of alcohol abuse    History of cocaine use    History of echocardiogram    a. Echo 4/14: Moderate LVH, vigorous LVEF, EF 65-70%, normal wall motion, grade 2 diastolic dysfunction, mildly dilated aortic root and ascending aorta, ascending aorta 40 mm, aortic root 38 mm, mild LAE   History of pancreatitis 01/22/2019   Hx of cardiovascular stress test    a. GXT 5/14: No ischemic changes  //  b. ETT-Myoview 3/16:  Low risk, no ischemia, EF 58%   Hyperlipidemia    Hypertension    Morbid obesity (HCC)    Paroxysmal atrial fibrillation (HCC)    Occurring in 2008, with several recurrence since then (including in the setting of + cocaine on UDS).   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 20 tablet 0   amLODipine  (NORVASC ) 10 MG tablet TAKE 1 TABLET BY MOUTH EVERY DAY 30 tablet 11   amphetamine -dextroamphetamine  (ADDERALL XR) 30 MG 24 hr capsule Take 1 capsule (30 mg total) by mouth daily as needed (focus). 1 daily as needed 30 capsule 0   apixaban  (ELIQUIS ) 5 MG TABS tablet Take 1 tablet (5 mg total) by mouth 2 (two) times daily. 60 tablet 11   atorvastatin  (LIPITOR ) 80 MG tablet Take 1 tablet (80 mg total) by mouth daily. 90 tablet 3   Blood Glucose Monitoring Suppl DEVI 1 each by Does not apply  route in the morning, at noon, and at bedtime. May substitute to any manufacturer covered by patient's insurance. 1 each 0   Blood Glucose Monitoring Suppl DEVI 1 each by Does not apply route in the morning, at noon, and at bedtime. May substitute to any manufacturer covered by patient's insurance. 1 each 0   carvedilol  (COREG ) 6.25 MG tablet Take 1 tablet (6.25 mg total) by mouth 2 (two) times daily with a meal. 60 tablet 11   glucose blood (ACCU-CHEK GUIDE) test strip Please use strips to test morning fasting sugars and one hour after each meal, total 4x daily 200 strip 5   Glucose Blood (BLOOD GLUCOSE TEST STRIPS) STRP 1 each by In Vitro route in the morning, at noon, and at bedtime. May substitute to any manufacturer covered by patient's insurance. 100 each 3   hydrALAZINE  (APRESOLINE ) 25 MG tablet Take 1 tablet (25 mg total) by mouth 2 (two) times daily. 60 tablet 3   JARDIANCE  25 MG TABS tablet TAKE 1 TABLET (25 MG TOTAL) BY MOUTH DAILY. 90 tablet 2   metFORMIN  (GLUCOPHAGE ) 1000 MG tablet TAKE 1 TABLET (1,000 MG TOTAL) BY MOUTH TWICE A DAY WITH FOOD 60 tablet 11   olmesartan  (BENICAR ) 20 MG tablet TAKE 1 TABLET BY MOUTH  EVERY DAY 30 tablet 1   pantoprazole  (PROTONIX ) 40 MG tablet TAKE 1 TABLET BY MOUTH EVERY DAY 90 tablet 3   tadalafil  (CIALIS ) 10 MG tablet Take 1 tablet (10 mg total) by mouth daily. 30 tablet 2   No current facility-administered medications on file prior to visit.    Family History  Problem Relation Age of Onset   Diabetes Mother    Heart attack Father 28   Stroke Maternal Grandmother    Stroke Paternal Grandmother    Diabetes Paternal Grandfather     Social History   Socioeconomic History   Marital status: Single    Spouse name: Not on file   Number of children: 0   Years of education: 12   Highest education level: Not on file  Occupational History   Occupation: Unemployed    Employer: UNEMPLOYED  Tobacco Use   Smoking status: Former    Current packs/day:  0.00    Average packs/day: 0.5 packs/day for 28.0 years (14.0 ttl pk-yrs)    Types: Cigarettes    Start date: 10/15/1994    Quit date: 10/15/2022    Years since quitting: 1.9   Smokeless tobacco: Former    Types: Snuff   Tobacco comments:    1 pack per week HEI 06/09/22  Vaping Use   Vaping status: Never Used  Substance and Sexual Activity   Alcohol use: Yes    Alcohol/week: 6.0 standard drinks of alcohol    Types: 6 Standard drinks or equivalent per week    Comment: rarely   Drug use: Not Currently    Comment: cocaine in the past, none currently   Sexual activity: Not on file  Other Topics Concern   Not on file  Social History Narrative   Fun: Photographer       Lives in [place] with sister.      Unemployed, was working a office manager job   Social Drivers of Corporate Investment Banker Strain: Medium Risk (04/08/2024)   Overall Financial Resource Strain (CARDIA)    Difficulty of Paying Living Expenses: Somewhat hard  Food Insecurity: No Food Insecurity (04/08/2024)   Hunger Vital Sign    Worried About Running Out of Food in the Last Year: Never true    Ran Out of Food in the Last Year: Never true  Transportation Needs: Unmet Transportation Needs (02/02/2023)   PRAPARE - Administrator, Civil Service (Medical): Yes    Lack of Transportation (Non-Medical): No  Physical Activity: Inactive (12/11/2022)   Exercise Vital Sign    Days of Exercise per Week: 0 days    Minutes of Exercise per Session: 0 min  Stress: No Stress Concern Present (04/08/2024)   [employer] of Occupational Health - Occupational Stress Questionnaire    Feeling of Stress: Not at all  Social Connections: Socially Isolated (04/08/2024)   Social Connection and Isolation Panel    Frequency of Communication with Friends and Family: More than three times a week    Frequency of Social Gatherings with Friends and Family: Once a week    Attends Religious Services: Never    Database Administrator  or Organizations: No    Attends Banker Meetings: Never    Marital Status: Never married  Intimate Partner Violence: Not At Risk (04/08/2024)   Humiliation, Afraid, Rape, and Kick questionnaire    Fear of Current or Ex-Partner: No    Emotionally Abused: No    Physically Abused: No  Sexually Abused: No    Review of Systems: ROS negative except for what is noted on the assessment and plan.  Vitals:   09/09/24 0811  BP: 128/79  Pulse: 81  Temp: 97.6 F (36.4 C)  TempSrc: Oral  SpO2: 98%  Weight: 279 lb 9.6 oz (126.8 kg)  Height: 5' 9 (1.753 m)    Physical Exam: Constitutional: obese, sitting in chair, in no acute distress Cardiovascular: regular rate and rhythm, no m/r/g, no LEE Pulmonary/Chest: normal work of breathing on room air, lungs clear to auscultation bilaterally Skin: warm and dry Psych: normal mood and behavior  Assessment & Plan:  Patient discussed with Dr. Jeanelle  Type 2 diabetes mellitus with complications (HCC) A1c 06/2024 was 6.8. Managed with Mounjaro  7.5, max Metformin , and Jardiance  25. Will increase Mounjaro  today as he was been tolerating current dose well. Repeat A1c at follow-up. Ophthalmology referral and BMP today.  Benign essential hypertension Normotensive. Continue  olmesartan  20 mg daily, hydralazine  25 mg twice daily, and amlodipine  10 mg daily. Also takes Coreg  6.25 BID for Paroxysmal A-fib, HFpEF.  Healthcare maintenance Colonoscopy referral.  Hyperlipidemia associated with type 2 diabetes mellitus (HCC) LDL 12/2023 51 with goal < 55. Continue Lipitor  80.    Norman Lobstein, D.O. [facility] Internal Medicine, PGY-2 Phone: [phone] Date 09/15/2024 Time 8:11 AM

## 2024-09-09 NOTE — Patient Instructions (Addendum)
 Call your Cardiologist [address]  [phone] Wendel Haws, MD

## 2024-09-10 ENCOUNTER — Ambulatory Visit: Payer: Self-pay | Admitting: Student

## 2024-09-10 LAB — BASIC METABOLIC PANEL WITH GFR
BUN/Creatinine Ratio: 19 (ref 9–20)
BUN: 27 mg/dL — ABNORMAL HIGH (ref 6–24)
CO2: 22 mmol/L (ref 20–29)
Calcium: 9 mg/dL (ref 8.7–10.2)
Chloride: 106 mmol/L (ref 96–106)
Creatinine, Ser: 1.42 mg/dL — ABNORMAL HIGH (ref 0.76–1.27)
Glucose: 95 mg/dL (ref 70–99)
Potassium: 4.3 mmol/L (ref 3.5–5.2)
Sodium: 143 mmol/L (ref 134–144)
eGFR: 59 mL/min/1.73 — ABNORMAL LOW (ref >59)

## 2024-09-15 DIAGNOSIS — E1169 Type 2 diabetes mellitus with other specified complication: Secondary | ICD-10-CM | POA: Insufficient documentation

## 2024-09-15 NOTE — Assessment & Plan Note (Signed)
 Colonoscopy referral

## 2024-09-15 NOTE — Assessment & Plan Note (Addendum)
 Normotensive. Continue  olmesartan  20 mg daily, hydralazine  25 mg twice daily, and amlodipine  10 mg daily. Also takes Coreg  6.25 BID for Paroxysmal A-fib, HFpEF.

## 2024-09-15 NOTE — Assessment & Plan Note (Signed)
 LDL 12/2023 51 with goal < 55. Continue Lipitor  80.

## 2024-09-15 NOTE — Assessment & Plan Note (Signed)
 A1c 06/2024 was 6.8. Managed with Mounjaro  7.5, max Metformin , and Jardiance  25. Will increase Mounjaro  today as he was been tolerating current dose well. Repeat A1c at follow-up. Ophthalmology referral and BMP today.

## 2024-09-16 NOTE — Progress Notes (Signed)
 Internal Medicine Clinic Attending  Case discussed with the resident at the time of the visit.  We reviewed the resident's history and exam and pertinent patient test results.  I agree with the assessment, diagnosis, and plan of care documented in the resident's note.

## 2024-09-17 ENCOUNTER — Telehealth: Payer: Self-pay | Admitting: Orthopaedic Surgery

## 2024-09-17 NOTE — Telephone Encounter (Signed)
 Daapika From [employer] called wanting to know the status of forms that were sent on pt. Call back number is [phone].Ref number is [id number].

## 2024-09-17 NOTE — Telephone Encounter (Signed)
 They have contacted the wrong office I believe. I have not received anything. Patient has never been seen at [facility].

## 2024-09-22 ENCOUNTER — Telehealth: Payer: Self-pay | Admitting: *Deleted

## 2024-09-22 NOTE — Telephone Encounter (Unsigned)
 Copied from CRM [id number]. Topic: Clinical - Lab/Test Results >> Sep 22, 2024  1:56 PM Brittney F wrote: Reason for CRM:   Patient is calling in to discuss his lab results in length. Patient was made aware that Dr. Norman stated that his BMP was Stable but the patient would like further clarification on what that means exactly. Please ensure to call the patient back to discuss recent lab results.   Patient also mentioned a his Cologuard test which is dated for 09/19/2024 and was informed that it is still in processing.   Callback Number: [id number]

## 2024-09-23 LAB — COLOGUARD: COLOGUARD: NEGATIVE

## 2024-09-23 NOTE — Telephone Encounter (Signed)
 Patient called [id number] returning your call. Please return the patients call.

## 2024-10-04 ENCOUNTER — Other Ambulatory Visit: Payer: Self-pay | Admitting: Student

## 2024-10-05 ENCOUNTER — Other Ambulatory Visit: Payer: Self-pay | Admitting: Student

## 2024-10-05 DIAGNOSIS — I480 Paroxysmal atrial fibrillation: Secondary | ICD-10-CM

## 2024-10-06 NOTE — Telephone Encounter (Signed)
 Medication sent to pharmacy

## 2024-10-07 ENCOUNTER — Telehealth: Payer: Self-pay | Admitting: Internal Medicine

## 2024-10-07 DIAGNOSIS — G47411 Narcolepsy with cataplexy: Secondary | ICD-10-CM

## 2024-10-07 MED ORDER — AMPHETAMINE-DEXTROAMPHET ER 30 MG PO CP24: 30.0000 mg | ORAL_CAPSULE | Freq: Every day | ORAL | 0 refills | Status: DC | PRN

## 2024-10-07 NOTE — Telephone Encounter (Signed)
Patient is requesting refill on adderall.   °

## 2024-10-07 NOTE — Telephone Encounter (Signed)
 Adderall  refilled

## 2024-10-07 NOTE — Telephone Encounter (Signed)
 Copied from CRM #[id number]. Topic: Clinical - Medication Refill >> Oct 07, 2024  8:46 AM [name] wrote: Medication: amphetamine -dextroamphetamine  (ADDERALL XR) 30 MG 24 hr capsule Has the patient contacted their pharmacy? Yes  This is the patient's preferred pharmacy:  [phone] [address], [place] - [address] [place] [address] Phone: [phone] Fax: [phone]  Is this the correct pharmacy for this prescription? Yes If no, delete pharmacy and type the correct one.   Has the prescription been filled recently? No  Is the patient out of the medication? Yes  Has the patient been seen for an appointment in the last year OR does the patient have an upcoming appointment? Yes  Can we respond through MyChart? No  Agent: Please be advised that Rx refills may take up to 3 business days. We ask that you follow-up with your pharmacy.

## 2024-10-13 ENCOUNTER — Ambulatory Visit: Admitting: "Endocrinology

## 2024-10-14 ENCOUNTER — Encounter: Admitting: Primary Care

## 2024-10-14 DIAGNOSIS — G4733 Obstructive sleep apnea (adult) (pediatric): Secondary | ICD-10-CM

## 2024-11-07 ENCOUNTER — Encounter: Admitting: Adult Health

## 2024-11-07 ENCOUNTER — Telehealth: Payer: Self-pay | Admitting: Internal Medicine

## 2024-11-07 DIAGNOSIS — G47411 Narcolepsy with cataplexy: Secondary | ICD-10-CM

## 2024-11-07 MED ORDER — AMPHETAMINE-DEXTROAMPHET ER 30 MG PO CP24: 30.0000 mg | ORAL_CAPSULE | Freq: Every day | ORAL | 0 refills | Status: AC | PRN

## 2024-11-07 NOTE — Telephone Encounter (Signed)
 Copied from CRM #[id number]. Topic: Clinical - Medication Refill >> Nov 07, 2024  9:29 AM Benton O wrote: Medication: amphetamine -dextroamphetamine  (ADDERALL XR) 30 MG 24 hr capsule  Has the patient contacted their pharmacy? No they say he has to call the office  (Agent: If no, request that the patient contact the pharmacy for the refill. If patient does not wish to contact the pharmacy document the reason why and proceed with request.) (Agent: If yes, when and what did the pharmacy advise?)  This is the patient's preferred pharmacy:  [phone] [address], [place] - [address] [place] [address] Phone: [phone] Fax: [phone]  Is this the correct pharmacy for this prescription? Yes If no, delete pharmacy and type the correct one.   Has the prescription been filled recently? No  Is the patient out of the medication? Yes  Has the patient been seen for an appointment in the last year OR does the patient have an upcoming appointment? Yes  Can we respond through MyChart? No  Agent: Please be advised that Rx refills may take up to 3 business days. We ask that you follow-up with your pharmacy.

## 2024-11-07 NOTE — Telephone Encounter (Signed)
 Sent in Rx, keep apt on 1/28

## 2024-11-07 NOTE — Telephone Encounter (Signed)
 Pt has ov with Beth 1/28. Requesting adderall refill, Landry can you please advise

## 2024-11-11 ENCOUNTER — Ambulatory Visit: Admitting: "Endocrinology

## 2024-11-12 ENCOUNTER — Encounter: Admitting: Primary Care

## 2024-12-10 ENCOUNTER — Ambulatory Visit: Admitting: "Endocrinology

## 2024-12-10 ENCOUNTER — Encounter: Admitting: Primary Care

## 2024-12-11 ENCOUNTER — Ambulatory Visit: Admitting: Podiatry

## 2024-12-12 ENCOUNTER — Ambulatory Visit: Admitting: Podiatry
